# Patient Record
Sex: Female | Born: 1935
Health system: Southern US, Community
[De-identification: ages and names within clinical notes are randomized; demographics above are authoritative.]

## PROBLEM LIST (undated history)

## (undated) DIAGNOSIS — N289 Disorder of kidney and ureter, unspecified: Secondary | ICD-10-CM

## (undated) DIAGNOSIS — G43909 Migraine, unspecified, not intractable, without status migrainosus: Secondary | ICD-10-CM

## (undated) DIAGNOSIS — F419 Anxiety disorder, unspecified: Secondary | ICD-10-CM

## (undated) DIAGNOSIS — M545 Low back pain, unspecified: Secondary | ICD-10-CM

## (undated) DIAGNOSIS — I10 Essential (primary) hypertension: Secondary | ICD-10-CM

## (undated) DIAGNOSIS — I639 Cerebral infarction, unspecified: Secondary | ICD-10-CM

## (undated) DIAGNOSIS — J189 Pneumonia, unspecified organism: Secondary | ICD-10-CM

## (undated) DIAGNOSIS — I150 Renovascular hypertension: Secondary | ICD-10-CM

## (undated) DIAGNOSIS — M5126 Other intervertebral disc displacement, lumbar region: Secondary | ICD-10-CM

## (undated) DIAGNOSIS — M1990 Unspecified osteoarthritis, unspecified site: Secondary | ICD-10-CM

## (undated) DIAGNOSIS — E119 Type 2 diabetes mellitus without complications: Secondary | ICD-10-CM

## (undated) DIAGNOSIS — I739 Peripheral vascular disease, unspecified: Secondary | ICD-10-CM

## (undated) DIAGNOSIS — N390 Urinary tract infection, site not specified: Secondary | ICD-10-CM

## (undated) DIAGNOSIS — Z794 Long term (current) use of insulin: Secondary | ICD-10-CM

## (undated) DIAGNOSIS — I2510 Atherosclerotic heart disease of native coronary artery without angina pectoris: Secondary | ICD-10-CM

## (undated) DIAGNOSIS — G8929 Other chronic pain: Secondary | ICD-10-CM

## (undated) DIAGNOSIS — Z950 Presence of cardiac pacemaker: Secondary | ICD-10-CM

## (undated) DIAGNOSIS — IMO0001 Reserved for inherently not codable concepts without codable children: Secondary | ICD-10-CM

## (undated) HISTORY — PX: BACK SURGERY: SHX140

## (undated) HISTORY — PX: VAGINAL HYSTERECTOMY: SUR661

## (undated) HISTORY — PX: CARPAL TUNNEL RELEASE: SHX101

## (undated) HISTORY — PX: APPENDECTOMY: SHX54

## (undated) HISTORY — PX: URETERAL STENT PLACEMENT: SHX822

## (undated) HISTORY — DX: Unspecified osteoarthritis, unspecified site: M19.90

## (undated) HISTORY — PX: INSERT / REPLACE / REMOVE PACEMAKER: SUR710

## (undated) HISTORY — PX: LUMBAR LAMINECTOMY: SHX95

## (undated) HISTORY — PX: TONSILLECTOMY: SUR1361

## (undated) HISTORY — PX: EXCISIONAL HEMORRHOIDECTOMY: SHX1541

---

## 1989-03-08 HISTORY — PX: CAROTID ENDARTERECTOMY: SUR193

## 1997-03-08 DIAGNOSIS — I639 Cerebral infarction, unspecified: Secondary | ICD-10-CM

## 1997-03-08 HISTORY — DX: Cerebral infarction, unspecified: I63.9

## 1999-12-09 ENCOUNTER — Emergency Department (HOSPITAL_COMMUNITY): Admission: EM | Admit: 1999-12-09 | Discharge: 1999-12-09 | Payer: Self-pay | Admitting: Emergency Medicine

## 2000-01-15 ENCOUNTER — Ambulatory Visit (HOSPITAL_COMMUNITY): Admission: RE | Admit: 2000-01-15 | Discharge: 2000-01-15 | Payer: Self-pay | Admitting: Orthopedic Surgery

## 2000-01-15 ENCOUNTER — Encounter: Payer: Self-pay | Admitting: Orthopedic Surgery

## 2000-04-22 ENCOUNTER — Ambulatory Visit (HOSPITAL_COMMUNITY): Admission: RE | Admit: 2000-04-22 | Discharge: 2000-04-22 | Payer: Self-pay | Admitting: Neurosurgery

## 2000-04-25 ENCOUNTER — Inpatient Hospital Stay (HOSPITAL_COMMUNITY): Admission: RE | Admit: 2000-04-25 | Discharge: 2000-05-01 | Payer: Self-pay | Admitting: Neurosurgery

## 2000-05-27 ENCOUNTER — Encounter: Admission: RE | Admit: 2000-05-27 | Discharge: 2000-05-27 | Payer: Self-pay | Admitting: Neurosurgery

## 2000-08-12 ENCOUNTER — Encounter: Admission: RE | Admit: 2000-08-12 | Discharge: 2000-08-12 | Payer: Self-pay | Admitting: Neurosurgery

## 2002-03-24 ENCOUNTER — Ambulatory Visit (HOSPITAL_COMMUNITY): Admission: RE | Admit: 2002-03-24 | Discharge: 2002-03-24 | Payer: Self-pay | Admitting: Nephrology

## 2002-03-24 ENCOUNTER — Encounter: Payer: Self-pay | Admitting: Nephrology

## 2002-07-19 ENCOUNTER — Emergency Department (HOSPITAL_COMMUNITY): Admission: EM | Admit: 2002-07-19 | Discharge: 2002-07-19 | Payer: Self-pay

## 2002-09-18 ENCOUNTER — Ambulatory Visit (HOSPITAL_COMMUNITY): Admission: RE | Admit: 2002-09-18 | Discharge: 2002-09-18 | Payer: Self-pay | Admitting: Neurology

## 2003-01-08 ENCOUNTER — Emergency Department (HOSPITAL_COMMUNITY): Admission: EM | Admit: 2003-01-08 | Discharge: 2003-01-08 | Payer: Self-pay | Admitting: Emergency Medicine

## 2003-04-18 ENCOUNTER — Emergency Department (HOSPITAL_COMMUNITY): Admission: EM | Admit: 2003-04-18 | Discharge: 2003-04-18 | Payer: Self-pay | Admitting: Emergency Medicine

## 2003-04-29 ENCOUNTER — Emergency Department (HOSPITAL_COMMUNITY): Admission: EM | Admit: 2003-04-29 | Discharge: 2003-04-29 | Payer: Self-pay | Admitting: *Deleted

## 2003-05-24 ENCOUNTER — Ambulatory Visit (HOSPITAL_COMMUNITY): Admission: RE | Admit: 2003-05-24 | Discharge: 2003-05-24 | Payer: Self-pay | Admitting: Internal Medicine

## 2003-06-03 ENCOUNTER — Encounter (INDEPENDENT_AMBULATORY_CARE_PROVIDER_SITE_OTHER): Payer: Self-pay | Admitting: *Deleted

## 2003-06-03 ENCOUNTER — Ambulatory Visit (HOSPITAL_COMMUNITY): Admission: RE | Admit: 2003-06-03 | Discharge: 2003-06-03 | Payer: Self-pay | Admitting: Gastroenterology

## 2004-12-19 ENCOUNTER — Emergency Department (HOSPITAL_COMMUNITY): Admission: EM | Admit: 2004-12-19 | Discharge: 2004-12-19 | Payer: Self-pay | Admitting: Emergency Medicine

## 2007-07-19 ENCOUNTER — Emergency Department (HOSPITAL_COMMUNITY): Admission: EM | Admit: 2007-07-19 | Discharge: 2007-07-19 | Payer: Self-pay | Admitting: Emergency Medicine

## 2007-08-06 ENCOUNTER — Emergency Department (HOSPITAL_COMMUNITY): Admission: EM | Admit: 2007-08-06 | Discharge: 2007-08-06 | Payer: Self-pay | Admitting: Emergency Medicine

## 2007-12-07 ENCOUNTER — Encounter: Admission: RE | Admit: 2007-12-07 | Discharge: 2007-12-07 | Payer: Self-pay | Admitting: Cardiology

## 2007-12-19 ENCOUNTER — Ambulatory Visit (HOSPITAL_COMMUNITY): Admission: RE | Admit: 2007-12-19 | Discharge: 2007-12-20 | Payer: Self-pay | Admitting: Cardiology

## 2008-03-18 ENCOUNTER — Ambulatory Visit (HOSPITAL_COMMUNITY): Admission: RE | Admit: 2008-03-18 | Discharge: 2008-03-18 | Payer: Self-pay | Admitting: Cardiology

## 2008-04-11 ENCOUNTER — Encounter: Admission: RE | Admit: 2008-04-11 | Discharge: 2008-04-11 | Payer: Self-pay | Admitting: Internal Medicine

## 2008-06-07 ENCOUNTER — Observation Stay (HOSPITAL_COMMUNITY): Admission: EM | Admit: 2008-06-07 | Discharge: 2008-06-08 | Payer: Self-pay | Admitting: Emergency Medicine

## 2008-06-09 ENCOUNTER — Emergency Department (HOSPITAL_COMMUNITY): Admission: EM | Admit: 2008-06-09 | Discharge: 2008-06-09 | Payer: Self-pay | Admitting: Emergency Medicine

## 2009-04-11 ENCOUNTER — Ambulatory Visit: Payer: Self-pay | Admitting: Cardiovascular Disease

## 2009-04-11 ENCOUNTER — Encounter (INDEPENDENT_AMBULATORY_CARE_PROVIDER_SITE_OTHER): Payer: Self-pay | Admitting: Internal Medicine

## 2009-04-11 ENCOUNTER — Inpatient Hospital Stay (HOSPITAL_COMMUNITY): Admission: EM | Admit: 2009-04-11 | Discharge: 2009-04-15 | Payer: Self-pay | Admitting: Emergency Medicine

## 2009-04-19 ENCOUNTER — Emergency Department (HOSPITAL_COMMUNITY): Admission: EM | Admit: 2009-04-19 | Discharge: 2009-04-20 | Payer: Self-pay | Admitting: Emergency Medicine

## 2009-08-05 ENCOUNTER — Emergency Department (HOSPITAL_COMMUNITY): Admission: EM | Admit: 2009-08-05 | Discharge: 2009-08-05 | Payer: Self-pay | Admitting: Emergency Medicine

## 2009-08-13 ENCOUNTER — Encounter: Admission: RE | Admit: 2009-08-13 | Discharge: 2009-08-13 | Payer: Self-pay | Admitting: Orthopedic Surgery

## 2010-02-16 ENCOUNTER — Inpatient Hospital Stay (HOSPITAL_COMMUNITY)
Admission: RE | Admit: 2010-02-16 | Discharge: 2010-02-17 | Payer: Self-pay | Source: Home / Self Care | Attending: Cardiology | Admitting: Cardiology

## 2010-05-18 LAB — CBC
HCT: 38 % (ref 36.0–46.0)
MCH: 32.3 pg (ref 26.0–34.0)
MCHC: 33.9 g/dL (ref 30.0–36.0)
Platelets: 198 10*3/uL (ref 150–400)
RDW: 13.1 % (ref 11.5–15.5)
WBC: 5.8 10*3/uL (ref 4.0–10.5)

## 2010-05-18 LAB — GLUCOSE, CAPILLARY
Glucose-Capillary: 101 mg/dL — ABNORMAL HIGH (ref 70–99)
Glucose-Capillary: 119 mg/dL — ABNORMAL HIGH (ref 70–99)
Glucose-Capillary: 150 mg/dL — ABNORMAL HIGH (ref 70–99)
Glucose-Capillary: 92 mg/dL (ref 70–99)

## 2010-05-18 LAB — BASIC METABOLIC PANEL
Calcium: 9.3 mg/dL (ref 8.4–10.5)
Chloride: 99 mEq/L (ref 96–112)
GFR calc Af Amer: >60 mL/min (ref >60)
Potassium: 3.4 mEq/L — ABNORMAL LOW (ref 3.5–5.1)
Sodium: 136 mEq/L (ref 135–145)

## 2010-05-18 LAB — HEMOGLOBIN A1C: Hgb A1c MFr Bld: 8.1 % — ABNORMAL HIGH (ref <5.7)

## 2010-05-25 LAB — URINALYSIS, ROUTINE W REFLEX MICROSCOPIC
Bilirubin Urine: NEGATIVE
Glucose, UA: 250 mg/dL — AB
Ketones, ur: NEGATIVE mg/dL
Protein, ur: NEGATIVE mg/dL
Specific Gravity, Urine: 1.012 (ref 1.005–1.030)
Urobilinogen, UA: 0.2 mg/dL (ref 0.0–1.0)
pH: 5.5 (ref 5.0–8.0)

## 2010-05-27 LAB — GLUCOSE, CAPILLARY
Glucose-Capillary: 113 mg/dL — ABNORMAL HIGH (ref 70–99)
Glucose-Capillary: 115 mg/dL — ABNORMAL HIGH (ref 70–99)
Glucose-Capillary: 119 mg/dL — ABNORMAL HIGH (ref 70–99)
Glucose-Capillary: 138 mg/dL — ABNORMAL HIGH (ref 70–99)
Glucose-Capillary: 139 mg/dL — ABNORMAL HIGH (ref 70–99)
Glucose-Capillary: 144 mg/dL — ABNORMAL HIGH (ref 70–99)
Glucose-Capillary: 152 mg/dL — ABNORMAL HIGH (ref 70–99)
Glucose-Capillary: 154 mg/dL — ABNORMAL HIGH (ref 70–99)
Glucose-Capillary: 161 mg/dL — ABNORMAL HIGH (ref 70–99)
Glucose-Capillary: 162 mg/dL — ABNORMAL HIGH (ref 70–99)
Glucose-Capillary: 179 mg/dL — ABNORMAL HIGH (ref 70–99)
Glucose-Capillary: 186 mg/dL — ABNORMAL HIGH (ref 70–99)
Glucose-Capillary: 203 mg/dL — ABNORMAL HIGH (ref 70–99)
Glucose-Capillary: 231 mg/dL — ABNORMAL HIGH (ref 70–99)
Glucose-Capillary: 266 mg/dL — ABNORMAL HIGH (ref 70–99)
Glucose-Capillary: 326 mg/dL — ABNORMAL HIGH (ref 70–99)
Glucose-Capillary: 333 mg/dL — ABNORMAL HIGH (ref 70–99)
Glucose-Capillary: 37 mg/dL — CL (ref 70–99)
Glucose-Capillary: 384 mg/dL — ABNORMAL HIGH (ref 70–99)
Glucose-Capillary: 596 mg/dL (ref 70–99)
Glucose-Capillary: 66 mg/dL — ABNORMAL LOW (ref 70–99)
Glucose-Capillary: >600 mg/dL (ref 70–99)
Glucose-Capillary: >600 mg/dL (ref 70–99)

## 2010-05-27 LAB — BASIC METABOLIC PANEL
BUN: 12 mg/dL (ref 6–23)
BUN: 17 mg/dL (ref 6–23)
CO2: 22 mEq/L (ref 19–32)
CO2: 23 mEq/L (ref 19–32)
CO2: 23 mEq/L (ref 19–32)
Calcium: 7.6 mg/dL — ABNORMAL LOW (ref 8.4–10.5)
Calcium: 7.7 mg/dL — ABNORMAL LOW (ref 8.4–10.5)
Calcium: 7.8 mg/dL — ABNORMAL LOW (ref 8.4–10.5)
Chloride: 115 mEq/L — ABNORMAL HIGH (ref 96–112)
Chloride: 117 mEq/L — ABNORMAL HIGH (ref 96–112)
Creatinine, Ser: 0.78 mg/dL (ref 0.4–1.2)
GFR calc Af Amer: 31 mL/min — ABNORMAL LOW (ref >60)
GFR calc Af Amer: 52 mL/min — ABNORMAL LOW (ref >60)
GFR calc Af Amer: >60 mL/min (ref >60)
GFR calc non Af Amer: 26 mL/min — ABNORMAL LOW (ref >60)
Glucose, Bld: 20 mg/dL — CL (ref 70–99)
Glucose, Bld: 266 mg/dL — ABNORMAL HIGH (ref 70–99)
Glucose, Bld: 584 mg/dL (ref 70–99)
Potassium: 3.4 mEq/L — ABNORMAL LOW (ref 3.5–5.1)
Potassium: 3.4 mEq/L — ABNORMAL LOW (ref 3.5–5.1)
Potassium: 3.6 mEq/L (ref 3.5–5.1)
Sodium: 146 mEq/L — ABNORMAL HIGH (ref 135–145)
Sodium: 152 mEq/L — ABNORMAL HIGH (ref 135–145)
Sodium: 153 mEq/L — ABNORMAL HIGH (ref 135–145)

## 2010-05-27 LAB — RAPID URINE DRUG SCREEN, HOSP PERFORMED
Cocaine: NOT DETECTED
Opiates: NOT DETECTED
Tetrahydrocannabinol: NOT DETECTED

## 2010-05-27 LAB — URINE CULTURE
Colony Count: NO GROWTH
Culture: NO GROWTH

## 2010-05-27 LAB — COMPREHENSIVE METABOLIC PANEL
ALT: 33 U/L (ref 0–35)
Alkaline Phosphatase: 58 U/L (ref 39–117)
BUN: 57 mg/dL — ABNORMAL HIGH (ref 6–23)
CO2: 26 mEq/L (ref 19–32)
Calcium: 9.2 mg/dL (ref 8.4–10.5)
Creatinine, Ser: 2.51 mg/dL — ABNORMAL HIGH (ref 0.4–1.2)
GFR calc non Af Amer: 19 mL/min — ABNORMAL LOW (ref >60)
GFR calc non Af Amer: 35 mL/min — ABNORMAL LOW (ref >60)
Glucose, Bld: 1115 mg/dL (ref 70–99)
Glucose, Bld: 212 mg/dL — ABNORMAL HIGH (ref 70–99)
Potassium: 3.4 mEq/L — ABNORMAL LOW (ref 3.5–5.1)
Sodium: 145 mEq/L (ref 135–145)
Sodium: 156 mEq/L — ABNORMAL HIGH (ref 135–145)
Total Protein: 7.3 g/dL (ref 6.0–8.3)

## 2010-05-27 LAB — CBC
HCT: 42.8 % (ref 36.0–46.0)
HCT: 49.5 % — ABNORMAL HIGH (ref 36.0–46.0)
Hemoglobin: 14.6 g/dL (ref 12.0–15.0)
Hemoglobin: 16.1 g/dL — ABNORMAL HIGH (ref 12.0–15.0)
Hemoglobin: 16.7 g/dL — ABNORMAL HIGH (ref 12.0–15.0)
Hemoglobin: 18.2 g/dL — ABNORMAL HIGH (ref 12.0–15.0)
MCHC: 33.3 g/dL (ref 30.0–36.0)
MCHC: 33.5 g/dL (ref 30.0–36.0)
MCHC: 33.8 g/dL (ref 30.0–36.0)
MCHC: 34.5 g/dL (ref 30.0–36.0)
MCV: 101.7 fL — ABNORMAL HIGH (ref 78.0–100.0)
MCV: 102.9 fL — ABNORMAL HIGH (ref 78.0–100.0)
MCV: 104.2 fL — ABNORMAL HIGH (ref 78.0–100.0)
Platelets: 104 10*3/uL — ABNORMAL LOW (ref 150–400)
Platelets: 186 10*3/uL (ref 150–400)
Platelets: 88 10*3/uL — ABNORMAL LOW (ref 150–400)
RBC: 3.62 MIL/uL — ABNORMAL LOW (ref 3.87–5.11)
RBC: 4.92 MIL/uL (ref 3.87–5.11)
RDW: 14.1 % (ref 11.5–15.5)
RDW: 14.5 % (ref 11.5–15.5)
RDW: 14.6 % (ref 11.5–15.5)
RDW: 15 % (ref 11.5–15.5)

## 2010-05-27 LAB — URINE MICROSCOPIC-ADD ON

## 2010-05-27 LAB — URINALYSIS, ROUTINE W REFLEX MICROSCOPIC
Bilirubin Urine: NEGATIVE
Glucose, UA: >1000 mg/dL — AB
Hgb urine dipstick: NEGATIVE
Ketones, ur: 15 mg/dL — AB
Leukocytes, UA: NEGATIVE
Nitrite: NEGATIVE
Protein, ur: NEGATIVE mg/dL
Specific Gravity, Urine: 1.024 (ref 1.005–1.030)
Urobilinogen, UA: 0.2 mg/dL (ref 0.0–1.0)
pH: 5 (ref 5.0–8.0)
pH: 6 (ref 5.0–8.0)

## 2010-05-27 LAB — POCT I-STAT 3, VENOUS BLOOD GAS (G3P V)
O2 Saturation: 78 %
TCO2: 24 mmol/L (ref 0–100)
pCO2, Ven: 44.7 mmHg — ABNORMAL LOW (ref 45.0–50.0)
pH, Ven: 7.322 — ABNORMAL HIGH (ref 7.250–7.300)
pO2, Ven: 46 mmHg — ABNORMAL HIGH (ref 30.0–45.0)

## 2010-05-27 LAB — CULTURE, BLOOD (ROUTINE X 2): Culture: NO GROWTH

## 2010-05-27 LAB — HEPATIC FUNCTION PANEL
Bilirubin, Direct: 0.2 mg/dL (ref 0.0–0.3)
Indirect Bilirubin: 0.4 mg/dL (ref 0.3–0.9)
Total Bilirubin: 0.6 mg/dL (ref 0.3–1.2)

## 2010-05-27 LAB — POCT I-STAT, CHEM 8
BUN: 53 mg/dL — ABNORMAL HIGH (ref 6–23)
Calcium, Ion: 1.13 mmol/L (ref 1.12–1.32)
Chloride: 91 mEq/L — ABNORMAL LOW (ref 96–112)
Creatinine, Ser: 2 mg/dL — ABNORMAL HIGH (ref 0.4–1.2)
Glucose, Bld: 457 mg/dL — ABNORMAL HIGH (ref 70–99)
Glucose, Bld: >700 mg/dL (ref 70–99)
HCT: 37 % (ref 36.0–46.0)
Hemoglobin: 19 g/dL — ABNORMAL HIGH (ref 12.0–15.0)
Potassium: 5.9 mEq/L — ABNORMAL HIGH (ref 3.5–5.1)
Sodium: 144 mEq/L (ref 135–145)
TCO2: 21 mmol/L (ref 0–100)
TCO2: 32 mmol/L (ref 0–100)

## 2010-05-27 LAB — CARDIAC PANEL(CRET KIN+CKTOT+MB+TROPI)
CK, MB: 2.4 ng/mL (ref 0.3–4.0)
CK, MB: 3.6 ng/mL (ref 0.3–4.0)
Relative Index: INVALID (ref 0.0–2.5)
Total CK: 52 U/L (ref 7–177)
Total CK: 66 U/L (ref 7–177)

## 2010-05-27 LAB — LIPASE, BLOOD
Lipase: 12 U/L (ref 11–59)
Lipase: 12 U/L (ref 11–59)

## 2010-05-27 LAB — DIFFERENTIAL
Basophils Absolute: 0 10*3/uL (ref 0.0–0.1)
Basophils Relative: 0 % (ref 0–1)
Lymphocytes Relative: 4 % — ABNORMAL LOW (ref 12–46)
Lymphocytes Relative: 6 % — ABNORMAL LOW (ref 12–46)
Lymphs Abs: 0.8 10*3/uL (ref 0.7–4.0)
Monocytes Absolute: 0.2 10*3/uL (ref 0.1–1.0)
Monocytes Relative: 2 % — ABNORMAL LOW (ref 3–12)
Neutro Abs: 13.8 10*3/uL — ABNORMAL HIGH (ref 1.7–7.7)
Neutro Abs: 17.2 10*3/uL — ABNORMAL HIGH (ref 1.7–7.7)
Neutrophils Relative %: 92 % — ABNORMAL HIGH (ref 43–77)
Neutrophils Relative %: 93 % — ABNORMAL HIGH (ref 43–77)

## 2010-05-27 LAB — MAGNESIUM: Magnesium: 2.5 mg/dL (ref 1.5–2.5)

## 2010-05-27 LAB — KETONES, QUALITATIVE

## 2010-05-27 LAB — HEMOGLOBIN A1C: Hgb A1c MFr Bld: 12 % — ABNORMAL HIGH (ref 4.6–6.1)

## 2010-05-27 LAB — FOLATE RBC: RBC Folate: 799 ng/mL — ABNORMAL HIGH (ref 180–600)

## 2010-05-27 LAB — LIPID PANEL
Triglycerides: 129 mg/dL (ref <150)
VLDL: 26 mg/dL (ref 0–40)

## 2010-05-27 LAB — TSH: TSH: 0.32 u[IU]/mL — ABNORMAL LOW (ref 0.350–4.500)

## 2010-05-27 LAB — MRSA PCR SCREENING: MRSA by PCR: NEGATIVE

## 2010-05-27 LAB — D-DIMER, QUANTITATIVE: D-Dimer, Quant: 3.46 ug/mL-FEU — ABNORMAL HIGH (ref 0.00–0.48)

## 2010-06-17 LAB — COMPREHENSIVE METABOLIC PANEL
ALT: 24 U/L (ref 0–35)
Alkaline Phosphatase: 69 U/L (ref 39–117)
BUN: 35 mg/dL — ABNORMAL HIGH (ref 6–23)
CO2: 25 mEq/L (ref 19–32)
GFR calc non Af Amer: 26 mL/min — ABNORMAL LOW (ref >60)
Glucose, Bld: 1068 mg/dL (ref 70–99)
Potassium: 5.1 mEq/L (ref 3.5–5.1)
Sodium: 136 mEq/L (ref 135–145)
Total Bilirubin: 0.6 mg/dL (ref 0.3–1.2)

## 2010-06-17 LAB — BASIC METABOLIC PANEL
CO2: 24 mEq/L (ref 19–32)
Calcium: 8.3 mg/dL — ABNORMAL LOW (ref 8.4–10.5)
GFR calc Af Amer: 39 mL/min — ABNORMAL LOW (ref >60)
Potassium: 3.5 mEq/L (ref 3.5–5.1)
Sodium: 141 mEq/L (ref 135–145)

## 2010-06-17 LAB — CBC
HCT: 47.1 % — ABNORMAL HIGH (ref 36.0–46.0)
Hemoglobin: 15.8 g/dL — ABNORMAL HIGH (ref 12.0–15.0)
MCHC: 33.5 g/dL (ref 30.0–36.0)
MCHC: 34 g/dL (ref 30.0–36.0)
MCV: 98.3 fL (ref 78.0–100.0)
Platelets: 131 10*3/uL — ABNORMAL LOW (ref 150–400)
RBC: 4.68 MIL/uL (ref 3.87–5.11)
WBC: 7.1 10*3/uL (ref 4.0–10.5)

## 2010-06-17 LAB — GLUCOSE, CAPILLARY
Glucose-Capillary: 196 mg/dL — ABNORMAL HIGH (ref 70–99)
Glucose-Capillary: 281 mg/dL — ABNORMAL HIGH (ref 70–99)
Glucose-Capillary: 401 mg/dL — ABNORMAL HIGH (ref 70–99)
Glucose-Capillary: 438 mg/dL — ABNORMAL HIGH (ref 70–99)
Glucose-Capillary: 460 mg/dL — ABNORMAL HIGH (ref 70–99)
Glucose-Capillary: >600 mg/dL (ref 70–99)

## 2010-06-17 LAB — DIFFERENTIAL
Basophils Absolute: 0 10*3/uL (ref 0.0–0.1)
Basophils Relative: 0 % (ref 0–1)
Basophils Relative: 0 % (ref 0–1)
Eosinophils Absolute: 0 10*3/uL (ref 0.0–0.7)
Eosinophils Absolute: 0.3 10*3/uL (ref 0.0–0.7)
Neutro Abs: 8.3 10*3/uL — ABNORMAL HIGH (ref 1.7–7.7)
Neutrophils Relative %: 67 % (ref 43–77)
Neutrophils Relative %: 89 % — ABNORMAL HIGH (ref 43–77)

## 2010-06-17 LAB — POCT I-STAT 3, VENOUS BLOOD GAS (G3P V)
pCO2, Ven: 38.8 mmHg — ABNORMAL LOW (ref 45.0–50.0)
pH, Ven: 7.424 — ABNORMAL HIGH (ref 7.250–7.300)

## 2010-06-17 LAB — URINALYSIS, ROUTINE W REFLEX MICROSCOPIC
Hgb urine dipstick: NEGATIVE
Leukocytes, UA: NEGATIVE
Nitrite: NEGATIVE
Protein, ur: NEGATIVE mg/dL
Urobilinogen, UA: 0.2 mg/dL (ref 0.0–1.0)

## 2010-06-17 LAB — URINE MICROSCOPIC-ADD ON

## 2010-06-17 LAB — POCT I-STAT, CHEM 8
Creatinine, Ser: 0.9 mg/dL (ref 0.4–1.2)
Hemoglobin: 15 g/dL (ref 12.0–15.0)
Sodium: 134 mEq/L — ABNORMAL LOW (ref 135–145)
TCO2: 24 mmol/L (ref 0–100)

## 2010-06-22 LAB — GLUCOSE, CAPILLARY: Glucose-Capillary: 152 mg/dL — ABNORMAL HIGH (ref 70–99)

## 2010-07-21 NOTE — Cardiovascular Report (Signed)
Carmen [name], Carmen [name]               ACCOUNT NO.:  [id number]   MEDICAL RECORD NO.:  [id number]          PATIENT TYPE:  AMB   LOCATION:  SDS                          FACILITY:  [facility]   PHYSICIAN:  Vonna Kotyk R. Jacinto Halim, MD       DATE OF BIRTH:  27-Sep-1933   DATE OF PROCEDURE:  03/18/2008  DATE OF DISCHARGE:  03/18/2008                            CARDIAC CATHETERIZATION   PROCEDURES PERFORMED:  1. Right subclavian arteriogram.  2. Percutaneous transluminal angioplasty and stenting of the right      subclavian artery.   INDICATIONS:  [name] is a 77 year old female with known high-  grade right subclavian artery.  She has significant claudication of her  right extremity.  She also has mild contracture of her bicipital muscle  from prior stroke; however because of significant claudication symptoms,  she is now brought to the Catheterization Lab for PTA and stenting of  the right subclavian artery.   ANGIOGRAPHIC DATA:  Right innominate artery.  Right innominate artery  shows it to be widely patent.  The right subclavian artery just after  its origin has a high grade 90% stenosis.  There is significant calcium  noted into the right subclavian artery.  The calcification extents into  the left common carotid artery.   INTERVENTION DATA:  Successful PTA and stenting of the right subclavian  artery with implantation of an 8.0 x 24 mm Genesis stent, which was  deployed at 8 atmospheric pressure for 90 seconds.  Having performed  this, post-stenting angiography revealed excellent result with less than  20% residual stenosis.  There was significant waist and resistance noted  during balloon inflations at the site of stenoses.   There was no immediate complication noted.  The patient tolerated the  procedure well.  Postprocedure, brachial pulses and radial pulses were  3+.   A total of 75 mL of contrast was utilized for diagnostic and  intervention procedure.   OPERATOR:  Cristy Hilts. Jacinto Halim,  MD   ASSISTANT:  Nanetta Batty, MD   TECHNIQUE OF THE PROCEDURE:  Under usual sterile precautions using a 6-  French right femoral artery access, a 5-French JB1 diagnostic catheter  was advanced into the arch of the aorta and I was able to engage the  right innominate artery.  Using a 0.014th of an inch guidewire, long  exchange length Glidewire, I was able to cross the right subclavian  artery stenosis with mild-to-moderate difficulty.  Then having performed  this, I was able to advance the JB1 catheter into the right subclavian  artery distally.  The Glidewire was removed, heparin was administered,  and a stiff Amplatz wire was advanced into the right subclavian artery.  A 6-French shuttle sheath was then advanced over this Amplatzer wire and  carefully positioned into the distal end of the innominate artery.  Having performed this, a 5.0 x 20 mm FoxCross balloon was utilized to  perform balloon angioplasty at around 8 atmospheric pressure.  Having  performed this, the same balloon was advanced distally.  The Amplatz  wire was withdrawn, and a Wholey wire was  advanced into the distal  subclavian artery.  The balloon was exchanged and we decided to stent  this with a 8.0 x 24 mm Genesis or Opta stent and this was carefully  positioned and deployed at 8 atmospheric pressure for about 90 seconds.  Having performed this, angiography revealed excellent results.  The  balloon was then withdrawn out of the body and angiography repeated.  Guidewire was withdrawn, and the long shuttle sheath was exchanged to a  short 6-French sheath and the patient was transferred to the holding  area in a stable condition.  The patient tolerated the procedure.   Please note the procedure performed by Dr. Jeanella Cara, second assistant is  Dr. Nanetta Batty.      Cristy Hilts. Jacinto Halim, MD  Electronically Signed     JRG/MEDQ  D:  03/18/2008  T:  03/19/2008  Job:  [id number]   cc:   Candyce Churn. Allyne Gee, M.D.

## 2010-07-21 NOTE — Discharge Summary (Signed)
NAMEDIONNE, KNOOP               ACCOUNT NO.:  [id number]   MEDICAL RECORD NO.:  [id number]          PATIENT TYPE:  OIB   LOCATION:  6526                         FACILITY:  [facility]   PHYSICIAN:  Vonna Kotyk R. Jacinto Halim, MD       DATE OF BIRTH:  1933/09/21   DATE OF ADMISSION:  12/19/2007  DATE OF DISCHARGE:  12/20/2007                               DISCHARGE SUMMARY   DISCHARGE DIAGNOSES:  1. Peripheral vascular disease, status post left renal artery stenting      this admission with residual right subclavian artery stenosis, also      angioplasty this admission.  2. Insulin-dependent diabetes.  3. Treated hypertension.  4. Dyslipidemia.   HOSPITAL COURSE:  The patient is a 77 year old female followed by Dr.  Allyne Gee and Dr. Jacinto Halim.  She has known peripheral disease and difficult  to control hypertension.  Despite multiple medications, her blood  pressure has been elevated.  She has also had known right subclavian  artery stenosis with right upper extremity claudication.  She was set up  for elective peripheral angiogram, which was done on December 19, 2007,  by Dr. Jacinto Halim.  This revealed 80% left renal artery stenosis, which was  stented with a genesis stent.  She also had a 80%-90% right subclavian  artery ostial stenosis that was angioplastied.  We feel she can be  discharged December 20, 2007.  She will follow up with Dr. Jacinto Halim in a  couple of week with upper extremities and renal artery Dopplers.   LABS:  Sodium 136, potassium 3.8, BUN 8, creatinine 0.73.  White count  6.8, hemoglobin 12.9, hematocrit 39.1, and platelets 218.   DISCHARGE MEDICATIONS:  1. Aspirin 81 mg a day.  2. Amlodipine 10 mg a day.  3. Clonidine 0.2 mg b.i.d.  4. Lescol 80 mg a day.  5. Coreg 12.5 mg twice a day.  6. Januvia 100 mg a day.  7. Metformin 500 mg in the morning and 1 g in the evening will be held      until December 22, 2007.  8. Lantus 15 units a day.  9. Lisinopril and hydrochlorothiazide 20/12.5  daily.  10.Xanax p.r.n.  11.Plavix 75 mg a day.  12.Pepcid AC 20 mg daily.   Also an addendum to the above medications, at discharge we decided to  cut her clonidine back to 0.1 mg b.i.d.   DISPOSITION:  The patient was discharged in stable condition and will  follow up with Dr. Jacinto Halim in a couple of weeks.      Abelino Derrick, P.A.      Cristy Hilts. Jacinto Halim, MD  Electronically Signed    LKK/MEDQ  D:  12/20/2007  T:  12/20/2007  Job:  [id number]   cc:   Candyce Churn. Allyne Gee, M.D.

## 2010-07-21 NOTE — Cardiovascular Report (Signed)
[name], [name]               ACCOUNT NO.:  [id number]   MEDICAL RECORD NO.:  [id number]          PATIENT TYPE:  OIB   LOCATION:  6526                         FACILITY:  [facility]   PHYSICIAN:  Vonna Kotyk R. Jacinto Halim, MD       DATE OF BIRTH:  [birth year]   DATE OF PROCEDURE:  12/19/2007  DATE OF DISCHARGE:                            CARDIAC CATHETERIZATION   12/19/07   PROCEDURES PERFORMED:  1. Arch aortogram.  2. Selective right subclavian arteriogram.  3. Abdominal aortogram.  4. Selective left renal arteriogram.  5. Percutaneous transluminal angioplasty and stenting of the left      renal artery.   INDICATIONS:  [name] is a 77 year old female with known  peripheral arterial disease.  She has history of left carotid  endarterectomy followed by a patch angioplasty in [place] about 15  years ago.  She has had stroke with right-sided mild weakness.  She has  been having right upper extremity claudication and blood pressure  differential in both right and left arm.  She also has difficult to  control hypertension.  She had undergone duplex evaluation of her  carotids and also of the renal arteries and this had revealed high-grade  stenosis in right subclavian artery and also bilateral renal artery  stenosis.  Given this and given uncontrolled hypertension in spite of  aggressive medical therapy, she was brought to the peripheral  angiography suite to evaluate both the renal artery stenoses and also to  evaluate the right subclavian artery stenosis because of symptomatic  stenosis from right upper extremity claudication.   ARCH AORTOGRAM:  Arch aortogram revealed type 1 arch.  There was mild-to-  moderate amount of diffuse calcification noted in the arch of the aorta.   The right common carotid artery showed mild diffuse calcification.  The  left common carotid artery showed mild atherosclerotic changes and left  subclavian artery showed mild atherosclerotic changes.   The  right subclavian artery which was selectively cannulated revealed a  high-grade heavily calcified 80-90% stenoses.  There was acute angle  takeoff with acute angle anterior and inferior takeoff of the right  subclavian artery origin.   ABDOMINAL AORTOGRAM:  Abdominal aortogram revealed diffuse  atherosclerotic plaque in the abdominal aorta and moderate amount of  diffuse calcification.  There was a 30% infrarenal abdominal aortic  stenosis.   There were 2 renal arteries, one on either side.  The right renal artery  was widely patent and left renal artery showed 80% stenosis which was  long.   INTERVENTION DATA:  Successful PTA and stenting of the left renal artery  with implantation of a 7.0 x 18 mm Genesis over Opta balloon deployed at  the peak of 10 atmospheric pressure.  Overall, the stenosis was reduced  from 80% to 0% with brisk flow noted.  There was no evidence of  dissection.  The ostium was well covered.   RECOMMENDATIONS:  The patient will need elective intravascular  ultrasound-guided right subclavian artery stenosis angioplasty.  She  probably will need both the upper extremity and lower extremity arterial  access.  I have discussed these findings with Dr. Nanetta Batty.  We  will bring her back on elective fashion with a longer IVUS catheter  shaft as we have only 95-cm shaft and will need at least 110 or longer  shaft.  We will try to minimize the contrast.  During this procedure, we  utilized 110 mL of contrast.  She will be discharged home in the  morning.  Hopefully, her blood pressure will be better controlled.   PROCEDURE TECHNIQUE:  Under usual sterile precautions using a 5-French  right femoral artery access, a 5-French pigtail catheter was advanced to  the ascending aorta and arch aortogram was performed in the LAO  projection.  The same catheter was pulled up to the abdominal aorta and  abdominal aortogram was performed.  The catheter was then exchanged to  a  6-French Judkins right 4 diagnostic catheter and selective right  subclavian arteriography was performed.  Then the catheter was pulled  out of the body.   TECHNIQUE OF INTERVENTION:  Using heparin for anticoagulation and  maintaining ACT greater than 200, a 7-French LIMA guide catheter was  utilized to engage the left renal artery.  Using a Stabilizer guidewire,  I predilated the lesion with a 5.0 x 20 mm Aviator balloon was utilized  and 2 inflations at 8 atmospheric pressure was performed.  This was  followed by stenting with a 7.0 x 18 mm Genesis stent deployed at a peak  of 10 atmospheric pressure x2.  Postprocedure angiography revealed  excellent results.  The patient tolerated the procedure well.  Having  performed this, I withdrew the guidewire and guide catheter out of the  body.  No immediate complications were noted.      Cristy Hilts. Jacinto Halim, MD  Electronically Signed     JRG/MEDQ  D:  12/19/2007  T:  12/20/2007  Job:  [id number]   cc:   Candyce Churn. Allyne Gee, M.D.

## 2010-07-24 NOTE — Op Note (Signed)
[place]. [facility]  Patient:    Carmen [name], Carmen [name]                        MRN: [id number] Proc. Date: 04/25/00 Adm. Date:  [id number] Attending:  Tressie Stalker D                           Operative Report  BRIEF HISTORY:  The patient is a 77 year old black female, who has suffered from many years of back pain.  She has been disabled since 1992 secondary to back and right leg pain.  Her discomfort has significantly worsened recently and she failed medical management and was therefore worked up with a lumbar MRI demonstrating a herniated disk at L5-S1 on the right with a spondylolisthesis.  I discussed the various treatment options with her, including a microdiskectomy versus a posterior lumbar interbody fusion with insertion of pedicle screws and rods.  Patient weighed the risks, benefits and alternatives to surgery and decided to proceed with the lumbar fusion.  PREOPERATIVE DIAGNOSES:  L5-S1 grade 1 acquired spondylolisthesis, degenerative disk disease, spinal stenosis, herniated nucleus pulposus.  POSTOPERATIVE DIAGNOSES:  L5-S1 grade 1 acquired spondylolisthesis, degenerative disk disease, spinal stenosis, herniated nucleus pulposus.  PROCEDURE:  L5 Gill procedure with posterior lumbar interbody fusion L5-S1, insertion of Synthes cortical bone dowel (13 mm TLIF dowel), posterior nonsegmental instrumentation with SDRS 90 degree titanium pedicle screws and rods L5-S1, posterolateral arthrodesis L5-S1 with local morcellized autograft bone and cancellous allograft bone and Orthoblast putty.  SURGEON:  Cristi Loron, M.D.  ASSISTANT:  Tanya Nones. Jeral Fruit, M.D.  ANESTHESIA:  General endotracheal.  ESTIMATED BLOOD LOSS:  300  cc.  SPECIMENS:  None.  COMPLICATIONS:  None.  DRAINS:  None.  DESCRIPTION OF PROCEDURE:  The patient was brought to the operating room by the anesthesia team, general endotracheal anesthesia was induced.  The patient was  then turned to the prone position on the chest rolls.  Her lumbosacral region was then prepared with Betadine scrub and Betadine solution and sterile drapes were applied.  I then injected the area to be incised with Marcaine with epinephrine solution and I used a scalpel to make a vertical incision in the midline over the L5-S1 interspace.  I used electrocautery to dissect down to the thoracolumbar fascia, dividing the fascia bilaterally, performing a bilateral subperiosteal dissection, stripping the paraspinous musculature from the spinous process and lamina of L4, L5, and the upper sacrum.  I inserted the McCullough retractor for exposure and then obtained an intraoperative radiograph to confirm my location.  I then used the high-speed drill to perform a bilateral L5 laminotomies.  I then widened the laminotomies with the Kerrison punch.  I removed the bilateral L5-S1 ligamentum flavum.  I identified the thecal sac and performed a foraminotomy about the bilateral S1 nerve roots.  I took off more bone on the right side, as this was the side I put the TLIF bone in.  I removed more of the bone up in the pars region on the right side, as well as the medial aspect of the facet and identified the L5 nerve root as it exited around the L5 pedicle on the right.  I then used the DErrico retractor to retract the right thecal sac and S1 nerve root medially and I then incised the intervertebral disk and performed aggressive diskectomy using the pituitary forceps, Epstein and Scoville curets, and the various  TLIF angled curets.  I then repeated this procedure on the left side.  I performed a diskectomy on the left side.  I was then able to insert the interbody spreader on the left side and distract the L5-S1 interspace.  This gave greater access to the interspace from the right side.  I then used after I had cleared the soft tissue from the vertebral end plates with the curets.  I then inserted various  the seven, nine, 11, then 13 mm TLIF trial spacer after carefully retracting the thecal sac and nerve root out of harms way.  I inserted these on the right side.  Looked like a 13 mm TLIF spacer was the best fit.  I then inserted a 13 mm TLIF bone graft into the interspace from the right side after carefully retracting the thecal sac and right S1 nerve root medially and the L5 nerve root in the cephalad direction.  I did this under fluoroscopic guidance.  I then removed the interbody spreader and then tapped the TLIF bone into good position under fluoroscopic guidance in the L5-S1 interspace.  I then packed posteriorly to the TLIF spacer with cancellous allograft bone, as well as local morcellized autograft bone.  Having completed the posterior lumbar interbody fusion, I now turn my attention to the posterior nonsegmental instrumentation.  I used the electrocautery to expose the bilateral transverse process at L5, as well as sacral ala.  I then, under fluoroscopic guidance, used the high-speed drill to decorticate posterior to the bilateral L5-S1 pedicles.  I then cannulated the pedicles with the pedicle probe and then tapped the pedicles.  This was all done under fluoroscopic guidance and I felt about the anterior of the pedicles with the straight ball probe and noted they were well within the bone.  I inserted a 6.5 x ______ mm pedicle screws bilaterally at L5 and 6.5 x 40 mm pedicle screws bilaterally at S1.  I palpated about the bilateral L5 and S1 pedicles and noted I could not feel the pedicle screw breaching the cortex of the pedicles and that the bilateral L5 and S1 nerve roots were unharmed.  I then connected the unilateral pedicle screws with 40 mm titanium rods and secured the appropriate caps on each pedicle screw.  Having completed the posterior nonsegmental instrumentation, I now turn my attention to the posterolateral arthrodesis.  I used the high-speed drill to decorticate  the L5 transverse processes, the remainder of the pars region and the L5-S1 facet joint.  I then applied a combination of local morcellized autograft bone, cancellous allograft bone and Orthoblast putty over the  decorticated posterolateral bony structures.  I then inspected the thecal sac and bilateral L5, S1 nerve roots one last time and noted they were well-decompressed.  I achieved astringent hemostasis using bipolar electrocautery.  I then removed the retractor and then reapproximated the patients thoracolumbar fascia with interrupted #1 Vicryl suture, the subcutaneous tissue with interrupted 2-0 Vicryl, the skin with Steri-Strips and benzoin.  The wound was then coated with bacitracin ointment and sterile dressing applied.  The drapes were removed and the patient was subsequently returned to the supine position, where she was extubated by the anesthesia team and transported to the post anesthesia care unit in stable condition. All sponge, instrument and needle counts were correct at the end of this case. D:  04/25/00 TD:  04/25/00 Job: [id number]

## 2010-07-24 NOTE — Discharge Summary (Signed)
[place]. [facility]  Patient:    Carmen [name], Carmen [name]                        MRN: [id number] Adm. Date:  [id number] Disc. Date: [id number] Attending:  Tressie Stalker D                           Discharge Summary  ADMISSION DIAGNOSES:  L5-S1 spondylolisthesis.  DISCHARGE DIAGNOSIS:  L5-S1 spondylolisthesis.  HISTORY OF PRESENT ILLNESS:  The patient was admitted because of back and right leg pain.  X-rays show that she has degenerative disk disease at the level of L5-S1 with a herniated disk at the level on the right side and spondylolisthesis.  Surgery was advised by Dr. Lovell Sheehan in view of worsening of the pain.  LABORATORY DATA:  Laboratory is normal except she has a low sodium, between 125-130 with a serum osmolality of 278 and chloride between 86-91.  HOSPITAL COURSE:  The patient was taken to surgery and a L5-S1 diskectomy with fusion using pedicle screw was done.  The patient from the surgical point of view did well but the sodium has been persistently low.  We got an internal medicine consult and they discontinued the diuretic.  Today, the sodium is 129 with a chloride of 91.  This morning she was seen by internal medicine and can home to be followed by them early next week.  DISCHARGE CONDITION:  Improving.  DISCHARGE MEDICATIONS:  She will be taking Tylox for pain.  Also, she is going she is going to take her home medications except for the diuretic.  FOLLOW-UP:  She is to call Dr. Lovell Sheehan to set up to see Dr. Lovell Sheehan in two weeks.  She is to call her medical doctor to see him early next week.  ACTIVITIES:  She is not to drive.  She is not to do any lifting.  DIET:  Regular. DD:  05/01/00 TD:  05/02/00 Job: [id number]

## 2010-07-24 NOTE — H&P (Signed)
Carmen. [facility]  Patient:    Carmen [name], Carmen [name]                        MRN: [id number] Proc. Date: 04/25/00 Adm. Date:  [id number] Attending:  Tressie Stalker D                         History and Physical  CHIEF COMPLAINT:  Back pain, right leg pain.  HISTORY OF PRESENT ILLNESS:  The patient is a 77 year old black female who had trouble with her back for many years.  She has had intermittent flare-ups but treated with medications.  She had another severe flare-up in July of 2001 with increasing back pain and pain radiating down her right leg.  She failed medical management and was worked up with a lumbar MRI.  It demonstrated a spondylolisthesis at L5-S1 with a right-sided herniated nucleus pulposus.  She therefore weighed the risks, benefits and alternatives of surgery and decided to proceed with decompression and fusion.  PAST MEDICAL HISTORY:  Positive for cerebrovascular accident in 1993 with residual right upper extremity paresis, hypertension, diabetes mellitus diagnosed 17 years ago, cerebrovascular disease, hyponatremia (this is attributed to medications and has been worked up in the past).  PAST SURGICAL HISTORY:  Left carotid endarterectomy in 1993.  MEDICATIONS PRIOR TO ADMISSION: 1. Avandia 8 mg p.o. q.d. 2. Avalide 300/12.5 mg p.o. q.d. 3. Glucophage 1000 mg p.o. q.d. 4. Darvocet p.r.n. 5. Soma p.r.n. 6. Cardura 8 mg p.o. b.i.d. 7. Vioxx 25 mg p.o. q.d.  ALLERGIES:  No known drug allergies.  FAMILY MEDICAL HISTORY:  Patients mother died in her 47s secondary to a myocardial infarction.  The patients father died in his 87s secondary to to myeloma.  SOCIAL HISTORY:  The patient is married.  She has one daughter.  She lives in [place].  She has been disabled since 1992 secondary to her back troubles. She smokes one pack per day of cigarettes x approximately 50 years.  I highly advised her to quit.  She denies ethanol or drug  use.  REVIEW OF SYSTEMS:  Negative except as above.  PHYSICAL EXAMINATION:  GENERAL:  A pleasant 77 year old black female complaining of right leg pain.  VITAL SIGNS:  Height 5 feet 4 inches, weight 130 pounds.  HEENT:  Normal.  NECK:  Normal except that she has a well-healed left carotid endarterectomy scar without signs of infection.  THORAX:  Symmetric.  The lungs were clear to auscultation.  HEART:  Regular rate and rhythm.  ABDOMEN:  Soft and nontender.  EXTREMITIES:  No obvious deformities except that she has some mild contractures of her right hand.  BACK:  There is no point tenderness or deformities.  Straight leg raise testing is positive on the right and negative on the left.  Fabere testing is negative bilaterally.  NEUROLOGIC:  The patient is alert and oriented x 3.  Cranial nerves 2-12 were grossly intact bilaterally.  Vision is grossly normal bilaterally with corrective lenses.  Hearing grossly normal bilaterally.  Motor strength is 5/5 in bilateral deltoids, biceps, triceps, wrist extensor, psoas, quadriceps, gastrocnemius, extensor hallucis longus.  Left hand grip and interosseous: She had some weakness in the left hand grips,  i.e. 4/5.  Cerebellar exam demonstrates some apraxia in the right upper extremity otherwise unremarkable. Sensory exam is normal to light touch and pinprick sensation in all tested dermatomes bilaterally.  Deep tendon reflexes were 2/4  and her left biceps, triceps, 2+/4 in right biceps and triceps, absent in bilateral brachioradialis, trace in her bilateral quadriceps and left gastrocnemius and right gastrocnemius.  She has equivocal right plantar response and her left flexor plantar response.  LABORATORY DATA:  The patient had a lumbar MRI performed at [facility] on January 15, 2000.  She has a mild degenerative spinal disease at L5-S1 and has a right-sided herniated nucleus pulposus.  ASSESSMENT AND PLAN:  L5-S1 herniated  nucleus pulposus, spinal stenosis, spondylolisthesis, degenerative disk disease, lumbar radiculopathy.  Discussed the situation with the patient and reviewed the MRI scan with her and pointed out the abnormalities.  She appears to be suffering from right S1 radiculopathy caused by herniated disk at L5-S1, but she also has a spondylolisthesis.  I have discussed the treatment options including a microdiskectomy versus a Gill procedure, posterior lumbar interbody fusion and placement of pedicle screws and rods.  I have discussed the risks of surgery including the risks of anesthesia, medical complications, hemorrhage requiring transfusion, infection, dural tear, injury to the lumbar nerve roots causing temporary or permanent leg pain, numbness, weakness, postlaminectomy ____________ , fusion failure, malposition of instrumentation, failure to relieve the pain, worsening pain, abdominal injury, etc. The patient has weighed the risks, benefits and alternatives of surgery and wants to proceed with the fusion on April 25, 2000.  History  medical problems noted. Multiple medical problems noted. Hyponatremia.  This has been a chronic problem.  Her sodium is 130 the morning of surgery which is improved. DD:  04/25/00 TD:  04/25/00 Job: [id number]

## 2010-08-19 ENCOUNTER — Emergency Department (HOSPITAL_COMMUNITY)
Admission: EM | Admit: 2010-08-19 | Discharge: 2010-08-19 | Disposition: A | Discharge status: Home / Self Care | Payer: Medicare Other | Attending: Emergency Medicine | Admitting: Emergency Medicine

## 2010-08-19 DIAGNOSIS — E7800 Pure hypercholesterolemia, unspecified: Secondary | ICD-10-CM | POA: Insufficient documentation

## 2010-08-19 DIAGNOSIS — E119 Type 2 diabetes mellitus without complications: Secondary | ICD-10-CM | POA: Insufficient documentation

## 2010-08-19 DIAGNOSIS — Z79899 Other long term (current) drug therapy: Secondary | ICD-10-CM | POA: Insufficient documentation

## 2010-08-19 DIAGNOSIS — R609 Edema, unspecified: Secondary | ICD-10-CM | POA: Insufficient documentation

## 2010-08-19 DIAGNOSIS — M79609 Pain in unspecified limb: Secondary | ICD-10-CM | POA: Insufficient documentation

## 2010-08-19 DIAGNOSIS — I739 Peripheral vascular disease, unspecified: Secondary | ICD-10-CM | POA: Insufficient documentation

## 2010-08-19 DIAGNOSIS — M7989 Other specified soft tissue disorders: Secondary | ICD-10-CM | POA: Insufficient documentation

## 2010-08-19 DIAGNOSIS — I10 Essential (primary) hypertension: Secondary | ICD-10-CM | POA: Insufficient documentation

## 2010-08-19 DIAGNOSIS — L989 Disorder of the skin and subcutaneous tissue, unspecified: Secondary | ICD-10-CM | POA: Insufficient documentation

## 2010-08-19 LAB — COMPREHENSIVE METABOLIC PANEL
ALT: 36 U/L — ABNORMAL HIGH (ref 0–35)
Alkaline Phosphatase: 84 U/L (ref 39–117)
CO2: 26 mEq/L (ref 19–32)
Calcium: 9.3 mg/dL (ref 8.4–10.5)
GFR calc Af Amer: >60 mL/min (ref >60)
GFR calc non Af Amer: >60 mL/min (ref >60)
Glucose, Bld: 80 mg/dL (ref 70–99)
Potassium: 3.4 mEq/L — ABNORMAL LOW (ref 3.5–5.1)
Sodium: 139 mEq/L (ref 135–145)

## 2010-12-02 LAB — POCT I-STAT, CHEM 8
BUN: 6
Calcium, Ion: 1.26
Chloride: 100
Creatinine, Ser: 0.8
Glucose, Bld: 76
HCT: 47 — ABNORMAL HIGH
Hemoglobin: 16 — ABNORMAL HIGH
Potassium: 4.2
Sodium: 136
TCO2: 28

## 2010-12-02 LAB — DIFFERENTIAL
Basophils Absolute: 0
Basophils Relative: 0
Eosinophils Relative: 3
Lymphocytes Relative: 26
Monocytes Absolute: 0.3
Monocytes Relative: 3

## 2010-12-02 LAB — COMPREHENSIVE METABOLIC PANEL
AST: 33
Albumin: 4.3
Alkaline Phosphatase: 53
Chloride: 99
GFR calc Af Amer: >60
Potassium: 4.6
Total Bilirubin: 0.4

## 2010-12-02 LAB — CBC
Platelets: 216
WBC: 8.2

## 2010-12-07 LAB — CBC
Hemoglobin: 12.9
MCHC: 33
Platelets: 216
RDW: 14.1

## 2010-12-07 LAB — GLUCOSE, CAPILLARY
Glucose-Capillary: 129 — ABNORMAL HIGH
Glucose-Capillary: 177 — ABNORMAL HIGH
Glucose-Capillary: 307 — ABNORMAL HIGH

## 2010-12-07 LAB — BASIC METABOLIC PANEL
BUN: 8
Calcium: 9.2
Creatinine, Ser: 0.73
GFR calc non Af Amer: >60
Glucose, Bld: 122 — ABNORMAL HIGH
Sodium: 136

## 2011-03-28 ENCOUNTER — Encounter (HOSPITAL_COMMUNITY): Payer: Self-pay | Admitting: Emergency Medicine

## 2011-03-28 ENCOUNTER — Inpatient Hospital Stay (HOSPITAL_COMMUNITY): Payer: Medicare Other

## 2011-03-28 ENCOUNTER — Emergency Department (HOSPITAL_COMMUNITY): Payer: Medicare Other

## 2011-03-28 ENCOUNTER — Other Ambulatory Visit: Payer: Self-pay

## 2011-03-28 ENCOUNTER — Inpatient Hospital Stay (HOSPITAL_COMMUNITY)
Admission: EM | Admit: 2011-03-28 | Discharge: 2011-04-06 | DRG: 637 | Disposition: A | Discharge status: Home / Self Care | Payer: Medicare Other | Attending: Family Medicine | Admitting: Family Medicine

## 2011-03-28 DIAGNOSIS — I674 Hypertensive encephalopathy: Secondary | ICD-10-CM | POA: Diagnosis present

## 2011-03-28 DIAGNOSIS — E876 Hypokalemia: Secondary | ICD-10-CM

## 2011-03-28 DIAGNOSIS — F17200 Nicotine dependence, unspecified, uncomplicated: Secondary | ICD-10-CM | POA: Diagnosis present

## 2011-03-28 DIAGNOSIS — Z794 Long term (current) use of insulin: Secondary | ICD-10-CM

## 2011-03-28 DIAGNOSIS — G629 Polyneuropathy, unspecified: Secondary | ICD-10-CM

## 2011-03-28 DIAGNOSIS — N189 Chronic kidney disease, unspecified: Secondary | ICD-10-CM | POA: Diagnosis present

## 2011-03-28 DIAGNOSIS — I10 Essential (primary) hypertension: Secondary | ICD-10-CM

## 2011-03-28 DIAGNOSIS — E1149 Type 2 diabetes mellitus with other diabetic neurological complication: Secondary | ICD-10-CM | POA: Diagnosis present

## 2011-03-28 DIAGNOSIS — I2510 Atherosclerotic heart disease of native coronary artery without angina pectoris: Secondary | ICD-10-CM | POA: Diagnosis present

## 2011-03-28 DIAGNOSIS — E1142 Type 2 diabetes mellitus with diabetic polyneuropathy: Secondary | ICD-10-CM | POA: Diagnosis present

## 2011-03-28 DIAGNOSIS — I129 Hypertensive chronic kidney disease with stage 1 through stage 4 chronic kidney disease, or unspecified chronic kidney disease: Secondary | ICD-10-CM | POA: Diagnosis present

## 2011-03-28 DIAGNOSIS — Z720 Tobacco use: Secondary | ICD-10-CM

## 2011-03-28 DIAGNOSIS — E162 Hypoglycemia, unspecified: Secondary | ICD-10-CM

## 2011-03-28 DIAGNOSIS — R4182 Altered mental status, unspecified: Secondary | ICD-10-CM

## 2011-03-28 DIAGNOSIS — E119 Type 2 diabetes mellitus without complications: Secondary | ICD-10-CM

## 2011-03-28 DIAGNOSIS — J690 Pneumonitis due to inhalation of food and vomit: Secondary | ICD-10-CM | POA: Diagnosis present

## 2011-03-28 DIAGNOSIS — I739 Peripheral vascular disease, unspecified: Secondary | ICD-10-CM

## 2011-03-28 DIAGNOSIS — I150 Renovascular hypertension: Secondary | ICD-10-CM

## 2011-03-28 DIAGNOSIS — E1169 Type 2 diabetes mellitus with other specified complication: Principal | ICD-10-CM | POA: Diagnosis present

## 2011-03-28 HISTORY — DX: Essential (primary) hypertension: I10

## 2011-03-28 HISTORY — DX: Renovascular hypertension: I15.0

## 2011-03-28 HISTORY — DX: Atherosclerotic heart disease of native coronary artery without angina pectoris: I25.10

## 2011-03-28 HISTORY — DX: Peripheral vascular disease, unspecified: I73.9

## 2011-03-28 HISTORY — DX: Disorder of kidney and ureter, unspecified: N28.9

## 2011-03-28 HISTORY — DX: Other intervertebral disc displacement, lumbar region: M51.26

## 2011-03-28 LAB — URINALYSIS, ROUTINE W REFLEX MICROSCOPIC
Bilirubin Urine: NEGATIVE
Glucose, UA: NEGATIVE mg/dL
Specific Gravity, Urine: 1.008 (ref 1.005–1.030)
pH: 7.5 (ref 5.0–8.0)

## 2011-03-28 LAB — CBC
HCT: 43.1 % (ref 36.0–46.0)
Hemoglobin: 14.9 g/dL (ref 12.0–15.0)
MCH: 33 pg (ref 26.0–34.0)
MCHC: 34.6 g/dL (ref 30.0–36.0)
MCV: 95.4 fL (ref 78.0–100.0)

## 2011-03-28 LAB — DIFFERENTIAL
Basophils Relative: 0 % (ref 0–1)
Eosinophils Absolute: 0 10*3/uL (ref 0.0–0.7)
Eosinophils Relative: 0 % (ref 0–5)
Monocytes Absolute: 0.2 10*3/uL (ref 0.1–1.0)
Monocytes Relative: 5 % (ref 3–12)
Neutro Abs: 3.2 10*3/uL (ref 1.7–7.7)

## 2011-03-28 LAB — GLUCOSE, CAPILLARY: Glucose-Capillary: 106 mg/dL — ABNORMAL HIGH (ref 70–99)

## 2011-03-28 LAB — BASIC METABOLIC PANEL
BUN: 7 mg/dL (ref 6–23)
Chloride: 101 mEq/L (ref 96–112)
GFR calc non Af Amer: 82 mL/min — ABNORMAL LOW (ref >90)
Glucose, Bld: 87 mg/dL (ref 70–99)
Potassium: 2 mEq/L — CL (ref 3.5–5.1)
Sodium: 145 mEq/L (ref 135–145)

## 2011-03-28 LAB — CORTISOL: Cortisol, Plasma: 6.5 ug/dL

## 2011-03-28 LAB — URINE CULTURE

## 2011-03-28 LAB — URINE MICROSCOPIC-ADD ON

## 2011-03-28 MED ORDER — CLONIDINE HCL 0.2 MG PO TABS
0.2000 mg | ORAL_TABLET | Freq: Three times a day (TID) | ORAL | Status: DC
  Administered 2011-03-28 – 2011-04-01 (×12): 0.2 mg via ORAL
  Filled 2011-03-28 (×2): qty 1
  Filled 2011-03-28: qty 2
  Filled 2011-03-28 (×5): qty 1
  Filled 2011-03-28: qty 2
  Filled 2011-03-28 (×5): qty 1

## 2011-03-28 MED ORDER — ACETAMINOPHEN 325 MG PO TABS
650.0000 mg | ORAL_TABLET | Freq: Four times a day (QID) | ORAL | Status: DC | PRN
  Administered 2011-04-03: 650 mg via ORAL
  Filled 2011-03-28: qty 2

## 2011-03-28 MED ORDER — ASPIRIN 325 MG PO TABS
325.0000 mg | ORAL_TABLET | Freq: Every day | ORAL | Status: DC
  Administered 2011-03-28 – 2011-04-06 (×10): 325 mg via ORAL
  Filled 2011-03-28 (×9): qty 1

## 2011-03-28 MED ORDER — ACETAMINOPHEN 650 MG RE SUPP: 650.0000 mg | Freq: Four times a day (QID) | RECTAL | Status: DC | PRN

## 2011-03-28 MED ORDER — DOCUSATE SODIUM 100 MG PO CAPS
100.0000 mg | ORAL_CAPSULE | Freq: Two times a day (BID) | ORAL | Status: DC
  Administered 2011-03-28 – 2011-04-06 (×18): 100 mg via ORAL
  Filled 2011-03-28 (×19): qty 1

## 2011-03-28 MED ORDER — OXYCODONE HCL 5 MG PO TABS
5.0000 mg | ORAL_TABLET | ORAL | Status: DC | PRN
  Administered 2011-04-03 – 2011-04-05 (×4): 5 mg via ORAL
  Filled 2011-03-28 (×4): qty 1

## 2011-03-28 MED ORDER — ADULT MULTIVITAMIN W/MINERALS CH
1.0000 | ORAL_TABLET | Freq: Every day | ORAL | Status: DC
  Administered 2011-03-29 – 2011-04-06 (×9): 1 via ORAL
  Filled 2011-03-28 (×9): qty 1

## 2011-03-28 MED ORDER — AMLODIPINE BESYLATE 5 MG PO TABS
5.0000 mg | ORAL_TABLET | Freq: Every day | ORAL | Status: DC
  Filled 2011-03-28: qty 1

## 2011-03-28 MED ORDER — SODIUM CHLORIDE 0.9 % IJ SOLN: 3.0000 mL | INTRAMUSCULAR | Status: DC | PRN

## 2011-03-28 MED ORDER — LABETALOL HCL 5 MG/ML IV SOLN
0.5000 mg/min | INTRAVENOUS | Status: DC
  Administered 2011-03-28: 0.5 mg/min via INTRAVENOUS
  Filled 2011-03-28 (×2): qty 100

## 2011-03-28 MED ORDER — ALUM & MAG HYDROXIDE-SIMETH 200-200-20 MG/5ML PO SUSP: 30.0000 mL | Freq: Four times a day (QID) | ORAL | Status: DC | PRN

## 2011-03-28 MED ORDER — PANTOPRAZOLE SODIUM 40 MG PO TBEC
40.0000 mg | DELAYED_RELEASE_TABLET | Freq: Every day | ORAL | Status: DC
  Administered 2011-03-28 – 2011-04-06 (×9): 40 mg via ORAL
  Filled 2011-03-28 (×10): qty 1

## 2011-03-28 MED ORDER — HYDRALAZINE HCL 20 MG/ML IJ SOLN
10.0000 mg | Freq: Three times a day (TID) | INTRAMUSCULAR | Status: DC | PRN
  Administered 2011-03-28 – 2011-03-30 (×2): 10 mg via INTRAVENOUS
  Filled 2011-03-28: qty 1
  Filled 2011-03-28: qty 0.5

## 2011-03-28 MED ORDER — INSULIN ASPART 100 UNIT/ML ~~LOC~~ SOLN
0.0000 [IU] | Freq: Three times a day (TID) | SUBCUTANEOUS | Status: DC
  Administered 2011-03-29: 2 [IU] via SUBCUTANEOUS
  Administered 2011-03-29: 5 [IU] via SUBCUTANEOUS
  Administered 2011-03-30 (×2): 2 [IU] via SUBCUTANEOUS
  Administered 2011-03-31: 1 [IU] via SUBCUTANEOUS
  Administered 2011-03-31: 3 [IU] via SUBCUTANEOUS
  Administered 2011-04-01: 2 [IU] via SUBCUTANEOUS
  Administered 2011-04-02: 1 [IU] via SUBCUTANEOUS
  Administered 2011-04-02: 3 [IU] via SUBCUTANEOUS
  Administered 2011-04-03: 2 [IU] via SUBCUTANEOUS
  Administered 2011-04-03: 0 [IU] via SUBCUTANEOUS
  Administered 2011-04-03 – 2011-04-04 (×3): 1 [IU] via SUBCUTANEOUS
  Administered 2011-04-04 – 2011-04-05 (×2): 2 [IU] via SUBCUTANEOUS
  Administered 2011-04-05: 1 [IU] via SUBCUTANEOUS
  Administered 2011-04-05: 2 [IU] via SUBCUTANEOUS
  Administered 2011-04-06: 1 [IU] via SUBCUTANEOUS
  Administered 2011-04-06: 7 [IU] via SUBCUTANEOUS
  Filled 2011-03-28: qty 3

## 2011-03-28 MED ORDER — SODIUM CHLORIDE 0.9 % IV SOLN
250.0000 mL | INTRAVENOUS | Status: DC | PRN
  Administered 2011-03-30: 250 mL via INTRAVENOUS

## 2011-03-28 MED ORDER — ENOXAPARIN SODIUM 40 MG/0.4ML ~~LOC~~ SOLN
40.0000 mg | Freq: Every day | SUBCUTANEOUS | Status: DC
  Administered 2011-03-30 – 2011-04-05 (×8): 40 mg via SUBCUTANEOUS
  Filled 2011-03-28 (×9): qty 0.4

## 2011-03-28 MED ORDER — ONDANSETRON HCL 4 MG PO TABS: 4.0000 mg | ORAL_TABLET | Freq: Four times a day (QID) | ORAL | Status: DC | PRN

## 2011-03-28 MED ORDER — SODIUM CHLORIDE 0.9 % IJ SOLN
3.0000 mL | Freq: Two times a day (BID) | INTRAMUSCULAR | Status: DC
  Administered 2011-03-29 – 2011-04-06 (×14): 3 mL via INTRAVENOUS

## 2011-03-28 MED ORDER — NICOTINE 21 MG/24HR TD PT24
21.0000 mg | MEDICATED_PATCH | Freq: Every day | TRANSDERMAL | Status: DC
  Administered 2011-03-29 – 2011-04-04 (×7): 21 mg via TRANSDERMAL
  Filled 2011-03-28 (×9): qty 1

## 2011-03-28 MED ORDER — POTASSIUM CHLORIDE CRYS ER 20 MEQ PO TBCR
40.0000 meq | EXTENDED_RELEASE_TABLET | Freq: Once | ORAL | Status: AC
  Administered 2011-03-28: 40 meq via ORAL
  Filled 2011-03-28: qty 2

## 2011-03-28 MED ORDER — ALISKIREN FUMARATE 150 MG PO TABS
150.0000 mg | ORAL_TABLET | Freq: Every day | ORAL | Status: DC
  Administered 2011-03-29 – 2011-04-02 (×5): 150 mg via ORAL
  Filled 2011-03-28 (×5): qty 1

## 2011-03-28 MED ORDER — POTASSIUM CHLORIDE 10 MEQ/100ML IV SOLN
10.0000 meq | Freq: Once | INTRAVENOUS | Status: AC
  Administered 2011-03-28: 10 meq via INTRAVENOUS
  Filled 2011-03-28: qty 100

## 2011-03-28 MED ORDER — CLONIDINE HCL 0.1 MG PO TABS
0.2000 mg | ORAL_TABLET | Freq: Two times a day (BID) | ORAL | Status: DC
  Administered 2011-03-28: 0.2 mg via ORAL

## 2011-03-28 MED ORDER — SODIUM CHLORIDE 0.9 % IV SOLN
Freq: Once | INTRAVENOUS | Status: AC
  Administered 2011-03-28: 10:00:00 via INTRAVENOUS

## 2011-03-28 MED ORDER — ALISKIREN FUMARATE 150 MG PO TABS
150.0000 mg | ORAL_TABLET | Freq: Every day | ORAL | Status: DC
  Administered 2011-03-28: 150 mg via ORAL

## 2011-03-28 MED ORDER — INSULIN DETEMIR 100 UNIT/ML ~~LOC~~ SOLN: 6.0000 [IU] | Freq: Every day | SUBCUTANEOUS | Status: DC

## 2011-03-28 MED ORDER — GABAPENTIN 100 MG PO CAPS
100.0000 mg | ORAL_CAPSULE | Freq: Three times a day (TID) | ORAL | Status: DC
  Administered 2011-03-28 – 2011-04-06 (×26): 100 mg via ORAL
  Filled 2011-03-28 (×28): qty 1

## 2011-03-28 MED ORDER — DEXTROSE 50 % IV SOLN
INTRAVENOUS | Status: AC
  Administered 2011-03-28: 09:00:00
  Filled 2011-03-28: qty 50

## 2011-03-28 MED ORDER — ONDANSETRON HCL 4 MG/2ML IJ SOLN: 4.0000 mg | Freq: Four times a day (QID) | INTRAMUSCULAR | Status: DC | PRN

## 2011-03-28 MED ORDER — CILOSTAZOL 100 MG PO TABS
100.0000 mg | ORAL_TABLET | Freq: Two times a day (BID) | ORAL | Status: DC
  Administered 2011-03-28 – 2011-04-06 (×19): 100 mg via ORAL
  Filled 2011-03-28 (×20): qty 1

## 2011-03-28 MED ORDER — ALPRAZOLAM 0.5 MG PO TABS
0.5000 mg | ORAL_TABLET | Freq: Every evening | ORAL | Status: DC | PRN
  Administered 2011-03-28 – 2011-04-04 (×6): 0.5 mg via ORAL
  Filled 2011-03-28 (×5): qty 1

## 2011-03-28 NOTE — Progress Notes (Signed)
Patient's BP has remained high,m systolic 257 currently, despite getting clonidine/hydralazine/aliskren in ED. Will hold the meds and place on labetalol drip, and admit to SDU- to gradually reduce BP. Carmen Skillman,MD [phone].

## 2011-03-28 NOTE — ED Provider Notes (Signed)
History     CSN: [id number]  Arrival date & time 03/28/11  4098   First MD Initiated Contact with Patient 03/28/11 [phone]      No chief complaint on file.   (Consider location/radiation/quality/duration/timing/severity/associated sxs/prior treatment) The history is provided by the patient and a relative.   patient here from home after being found unresponsive. EMS was called patient blood sugar was less than 40. Was given and after D50 and patient was returned to her baseline. She does have a history of diabetes. Had a light meal last night for dinner. Patient's blood pressure was also noted to be elevated. She has not taken her antihypertensives this morning. She denies chest pain, shortness of breath, abdominal pain. No headache or fevers noted. No recent cough or congestion. She did have her antihypertensive medication adjusted by her physician last week and has been having trouble with blood pressure control  No past medical history on file.  No past surgical history on file.  No family history on file.  History  Substance Use Topics  . Smoking status: Not on file  . Smokeless tobacco: Not on file  . Alcohol Use: Not on file    OB History    No data available      Review of Systems  All other systems reviewed and are negative.    Allergies  Review of patient's allergies indicates not on file.  Home Medications  No current outpatient prescriptions on file.  BP 249/104  Pulse 92  Temp(Src) 97.7 F (36.5 C) (Oral)  Resp 20  SpO2 96%  Physical Exam  Nursing note and vitals reviewed. Constitutional: She is oriented to person, place, and time. She appears well-developed and well-nourished.  Non-toxic appearance. No distress.  HENT:  Head: Normocephalic and atraumatic.  Eyes: Conjunctivae, EOM and lids are normal. Pupils are equal, round, and reactive to light.  Neck: Normal range of motion. Neck supple. No tracheal deviation present. No mass present.    Cardiovascular: Normal rate, regular rhythm and normal heart sounds.  Exam reveals no gallop.   No murmur heard. Pulmonary/Chest: Effort normal and breath sounds normal. No stridor. No respiratory distress. She has no decreased breath sounds. She has no wheezes. She has no rhonchi. She has no rales.  Abdominal: Soft. Normal appearance and bowel sounds are normal. She exhibits no distension. There is no tenderness. There is no rebound and no CVA tenderness.  Musculoskeletal: Normal range of motion. She exhibits no edema and no tenderness.       Right lower leg: She exhibits tenderness.       Left lower leg: She exhibits tenderness.  Neurological: She is alert and oriented to person, place, and time. She has normal strength. No cranial nerve deficit or sensory deficit. GCS eye subscore is 4. GCS verbal subscore is 5. GCS motor subscore is 6.  Skin: Skin is warm and dry. No abrasion and no rash noted.  Psychiatric: She has a normal mood and affect. Her speech is normal and behavior is normal.    ED Course  Procedures (including critical care time)  Labs Reviewed - No data to display No results found.   No diagnosis found.    MDM   Date: 03/28/2011  Rate: 67  Rhythm: normal sinus rhythm  QRS Axis: normal  Intervals: QT prolonged  ST/T Wave abnormalities: nonspecific ST/T changes  Conduction Disutrbances:none  Narrative Interpretation:   Old EKG Reviewed: changes noted   11:18 AM Patient given potassium for her  hypokalemia. Patient given food. Patient will be admitted for treatment of this patient's regular home medications restarted for hypertension spoke with hospitalist and accept the patient       Toy Baker, MD 03/28/11 1121

## 2011-03-28 NOTE — ED Notes (Signed)
Critical K+ 2.0 Dr Freida Busman and Burnett Harry RN aware of results

## 2011-03-28 NOTE — ED Notes (Signed)
ZOX:[id number]<AV> Expected date:03/28/11<BR> Expected time: 9:13 AM<BR> Means of arrival:Ambulance<BR> Comments:<BR> EMS hyperglycemia

## 2011-03-28 NOTE — H&P (Signed)
PCP:   No primary provider on file.   Chief Complaint:  Unresponsive this morning, and a low sugar.  HPI: Carmen [name] is a pleasant 78 year old female with htn/dm2/tobacco habituation, who came because her son could not woke her up this morning. She normally wokes up at 430am, but she was still not up at 8am. Her daughter who calls her every morning could not get a response either. The family found her sugar to be 29 mg/dl. EMS gave her D50W at site and she is now close to her baseline but seems slow in speech. In ED her BP was 249/100 systolic, but patient states she hadn't taken her meds this morning. At the time of my evaluation her BP has improved to 165 systolic after she got her morning meds. She was also found to have potasium of 2, being replenished by the ER. She states she has a chronically low potassium per her doctor. She complaints that her feet are numb and sometimes painful. Denies headache or chest pain, or cough. She seems to eat very little. Denies feeling unwell in preceding days.  Review of Systems:  The patient denies anorexia, fever, weight loss,, vision loss, decreased hearing, hoarseness, chest pain, syncope, dyspnea on exertion, peripheral edema, balance deficits, hemoptysis, abdominal pain, melena, hematochezia, severe indigestion/heartburn, hematuria, incontinence, genital sores, muscle weakness, suspicious skin lesions, transient blindness, difficulty walking, depression, unusual weight change, abnormal bleeding, enlarged lymph nodes, angioedema, and breast masses.  Past Medical History: Past Medical History  Diagnosis Date  . Diabetes mellitus   . Hypertension   . Coronary artery disease   . Renal disorder   . Herniated lumbar intervertebral disc   . Cataract     cataract surgery scheduled for 03/31/11, 2 - left eye, 1 - right eye   Past Surgical History  Procedure Date  . Appendectomy   . Abdominal hysterectomy   . Ureteral stent placement   . Carotid  endarterectomy   . Laminectomy     Medications: Prior to Admission medications   Medication Sig Start Date End Date Taking? Authorizing Provider  aliskiren (TEKTURNA) 150 MG tablet Take 150 mg by mouth daily.   Yes Historical Provider, MD  ALPRAZolam Prudy Feeler) 0.5 MG tablet Take 0.5 mg by mouth at bedtime as needed.   Yes Historical Provider, MD  aspirin 325 MG tablet Take 325 mg by mouth daily.   Yes Historical Provider, MD  cilostazol (PLETAL) 100 MG tablet Take 100 mg by mouth 2 (two) times daily.   Yes Historical Provider, MD  cloNIDine (CATAPRES) 0.2 MG tablet Take 0.2 mg by mouth 2 (two) times daily.   Yes Historical Provider, MD  insulin detemir (LEVEMIR) 100 UNIT/ML injection Inject 10-20 Units into the skin at bedtime. 20 units in the morning and 10 units at bedtime   Yes Historical Provider, MD    Allergies:  No Known Allergies  Social History:  reports that she has been smoking.  She has never used smokeless tobacco. She reports that she does not drink alcohol. Her drug history not on file.   Family History: No family history on file.  Physical Exam: Filed Vitals:   03/28/11 0939 03/28/11 1023 03/28/11 1115  BP: 249/104  167/70  Pulse: 92  58  Temp: 97.7 F (36.5 C) 96.7 F (35.9 C)   TempSrc: Oral Rectal   Resp: 20  20  SpO2: 96%  94%   Seems slow but otherwise coherent.  HEENT- PEERRLA. No JVD. No carotid  bruits. RS- lungs clear. CVS- S1S2.RRR. Abdomen- soft, non tender. +BS. CNS- grossly intact. Extremities- couldn't  Feel dorsalis perdis pulses that well bilaterally. No edema.   Labs on Admission:   Basename 03/28/11 1000  NA 145  K 2.0*  CL 101  CO2 34*  GLUCOSE 87  BUN 7  CREATININE 0.72  CALCIUM 8.6  MG --  PHOS --   No results found for this basename: AST:2,ALT:2,ALKPHOS:2,BILITOT:2,PROT:2,ALBUMIN:2 in the last 72 hours No results found for this basename: LIPASE:2,AMYLASE:2 in the last 72 hours  Basename 03/28/11 0955  WBC 4.3  NEUTROABS  3.2  HGB 14.9  HCT 43.1  MCV 95.4  PLT 204   No results found for this basename: CKTOTAL:3,CKMB:3,CKMBINDEX:3,TROPONINI:3 in the last 72 hours No results found for this basename: TSH,T4TOTAL,FREET3,T3FREE,THYROIDAB in the last 72 hours No results found for this basename: VITAMINB12:2,FOLATE:2,FERRITIN:2,TIBC:2,IRON:2,RETICCTPCT:2 in the last 72 hours  Radiological Exams on Admission: No results found.  Assessment Pleasant diabetic lady who came in with an episode of unresponsiveness likely due to hypoglycemia. ?She doesn't seem to have changed her meal pattern. Her sugars from previous hospital encounters have ben in normal range. ?Was this episode of hypoglycemia isolated. She also had accelerated hypertnesion, likely clonidine rebound, and seems to be stabilizing now. The concern would be for other causes of the unresponsiveness, in addition to the hypoglycemia. Plan .Altered mental state- hypoglyemia versus other causes, especially in view of accelerated htn. Check 2decho/carotid duplex/ct brain. Marland KitchenHypoglycemia- reduce levemir dose. Check hba1c. Ssi. May be getting too much insulin. Marland KitchenHTN (hypertension), malignant- resume home meds. .Hypokalemia- ?chronic. Replenish k. Check magnesium level. Defer long term care to patient's pcp. ?Aldosteronism. .Tobacco abuse- cessation counseling given. Nicotine patch. .DM (diabetes mellitus), type 2- as for hypoglycemia. .Peripheral neuropathy vs PVD- ABI- trial of neurontin. Dvt/gi prophylaxis  Condition guarded. Anticipate early d/c.  Carmen [name] [phone]. 03/28/2011, 12:09 PM

## 2011-03-28 NOTE — ED Notes (Signed)
Critical Value results given to Dr. Freida Busman.

## 2011-03-29 DIAGNOSIS — L97509 Non-pressure chronic ulcer of other part of unspecified foot with unspecified severity: Secondary | ICD-10-CM

## 2011-03-29 DIAGNOSIS — R404 Transient alteration of awareness: Secondary | ICD-10-CM

## 2011-03-29 LAB — CBC
MCHC: 34.8 g/dL (ref 30.0–36.0)
Platelets: 175 10*3/uL (ref 150–400)
RDW: 15.2 % (ref 11.5–15.5)
WBC: 4.1 10*3/uL (ref 4.0–10.5)

## 2011-03-29 LAB — BASIC METABOLIC PANEL
BUN: 8 mg/dL (ref 6–23)
Calcium: 7.8 mg/dL — ABNORMAL LOW (ref 8.4–10.5)
Chloride: 104 mEq/L (ref 96–112)
Creatinine, Ser: 0.89 mg/dL (ref 0.50–1.10)
GFR calc Af Amer: 72 mL/min — ABNORMAL LOW (ref >90)
GFR calc non Af Amer: 62 mL/min — ABNORMAL LOW (ref >90)

## 2011-03-29 LAB — CARDIAC PANEL(CRET KIN+CKTOT+MB+TROPI)
CK, MB: 3.4 ng/mL (ref 0.3–4.0)
Relative Index: 1.7 (ref 0.0–2.5)
Total CK: 157 U/L (ref 7–177)
Total CK: 186 U/L — ABNORMAL HIGH (ref 7–177)
Troponin I: <0.3 ng/mL (ref <0.30)

## 2011-03-29 LAB — GLUCOSE, CAPILLARY
Glucose-Capillary: 109 mg/dL — ABNORMAL HIGH (ref 70–99)
Glucose-Capillary: 264 mg/dL — ABNORMAL HIGH (ref 70–99)

## 2011-03-29 LAB — MRSA PCR SCREENING: MRSA by PCR: NEGATIVE

## 2011-03-29 LAB — MAGNESIUM: Magnesium: 1.6 mg/dL (ref 1.5–2.5)

## 2011-03-29 LAB — TSH: TSH: 4.388 u[IU]/mL (ref 0.350–4.500)

## 2011-03-29 LAB — LIPID PANEL
HDL: 63 mg/dL (ref >39)
LDL Cholesterol: 59 mg/dL (ref 0–99)
Total CHOL/HDL Ratio: 2.1 RATIO

## 2011-03-29 LAB — PROTIME-INR
INR: 0.94 (ref 0.00–1.49)
Prothrombin Time: 12.8 seconds (ref 11.6–15.2)

## 2011-03-29 LAB — NA AND K (SODIUM & POTASSIUM), RAND UR
Potassium Urine: 8 mEq/L
Sodium, Ur: 30 mEq/L

## 2011-03-29 LAB — PHOSPHORUS: Phosphorus: 3.1 mg/dL (ref 2.3–4.6)

## 2011-03-29 LAB — APTT: aPTT: 35 seconds (ref 24–37)

## 2011-03-29 LAB — CREATININE, URINE, RANDOM: Creatinine, Urine: 31.5 mg/dL

## 2011-03-29 MED ORDER — POTASSIUM CHLORIDE 20 MEQ/15ML (10%) PO LIQD
40.0000 meq | Freq: Once | ORAL | Status: AC
  Administered 2011-03-29: 40 meq via ORAL
  Filled 2011-03-29: qty 30

## 2011-03-29 MED ORDER — INSULIN DETEMIR 100 UNIT/ML ~~LOC~~ SOLN
6.0000 [IU] | Freq: Every day | SUBCUTANEOUS | Status: DC
  Administered 2011-03-29 – 2011-04-01 (×4): 6 [IU] via SUBCUTANEOUS
  Filled 2011-03-29: qty 3

## 2011-03-29 MED ORDER — MAGNESIUM SULFATE 50 % IJ SOLN
Freq: Once | INTRAVENOUS | Status: AC
  Administered 2011-03-29: 10:00:00 via INTRAVENOUS
  Filled 2011-03-29: qty 50

## 2011-03-29 MED ORDER — MAGNESIUM SULFATE 40 MG/ML IJ SOLN: 2.0000 g | Freq: Once | INTRAMUSCULAR | Status: DC

## 2011-03-29 MED ORDER — AMOXICILLIN-POT CLAVULANATE 875-125 MG PO TABS
1.0000 | ORAL_TABLET | Freq: Two times a day (BID) | ORAL | Status: DC
  Administered 2011-03-29 – 2011-04-02 (×9): 1 via ORAL
  Filled 2011-03-29 (×12): qty 1

## 2011-03-29 NOTE — Progress Notes (Signed)
Physical Therapy Evaluation Patient Details Name: Carmen [name] MRN: [id number] DOB: 03/11/1933 Today's Date: 03/29/2011 Time: 4098-1191 Charge: EVII  Problem List:  Patient Active Problem List  Diagnoses  . Altered mental state  . Hypoglycemia  . HTN (hypertension), malignant  . Hypokalemia  . Tobacco abuse  . DM (diabetes mellitus), type 2  . Peripheral neuropathy    Past Medical History:  Past Medical History  Diagnosis Date  . Diabetes mellitus   . Hypertension   . Coronary artery disease   . Renal disorder   . Herniated lumbar intervertebral disc   . Cataract     cataract surgery scheduled for 03/31/11, 2 - left eye, 1 - right eye   Past Surgical History:  Past Surgical History  Procedure Date  . Appendectomy   . Abdominal hysterectomy   . Ureteral stent placement   . Carotid endarterectomy   . Laminectomy     PT Assessment/Plan/Recommendation PT Assessment Clinical Impression Statement: Pt would benefit from acute PT services in order to improve ambulation and perform stairs to prepare for d/c home with spouse.  Pt with BP of 171/64 mmHg upon sitting EOB, so only ambulated pt to recliner.  Pt likely close to baseline so will work with pt in acute care but no f/u recommendations. PT Recommendation/Assessment: Patient will need skilled PT in the acute care venue PT Problem List: Decreased mobility;Decreased activity tolerance PT Therapy Diagnosis : Difficulty walking PT Plan PT Frequency: Min 3X/week PT Treatment/Interventions: DME instruction;Gait training;Functional mobility training;Patient/family education;Stair training PT Recommendation Follow Up Recommendations: No PT follow up Equipment Recommended: None recommended by PT PT Goals  Acute Rehab PT Goals PT Goal Formulation: With patient Time For Goal Achievement: 7 days Pt will Ambulate: >150 feet;with modified independence PT Goal: Ambulate - Progress: Goal set today Pt will Go Up / Down Stairs:  3-5 stairs;with rail(s);with modified independence PT Goal: Up/Down Stairs - Progress: Goal set today  PT Evaluation Precautions/Restrictions    Prior Functioning  Home Living Lives With: Spouse Type of Home: House Home Layout: One level Home Access: Stairs to enter Entrance Stairs-Rails: Can reach both Entrance Stairs-Number of Steps: 3 Home Adaptive Equipment: Shower chair without back;Straight cane;Walker - rolling Prior Function Level of Independence: Independent with gait Cognition Cognition Arousal/Alertness: Awake/alert Overall Cognitive Status: Appears within functional limits for tasks assessed Sensation/Coordination Sensation Light Touch: Appears Intact Extremity Assessment RLE Assessment RLE Assessment: Within Functional Limits LLE Assessment LLE Assessment: Within Functional Limits Mobility (including Balance) Bed Mobility Bed Mobility: Yes Supine to Sit: 6: Modified independent (Device/Increase time) Transfers Transfers: Yes Sit to Stand: 4: Min assist;From elevated surface Sit to Stand Details (indicate cue type and reason): min/guard 2* lines and oxygen Stand to Sit: 4: Min assist;To chair/3-in-1;With armrests Stand to Sit Details: min/guard, verbal cue for armrests Ambulation/Gait Ambulation/Gait: Yes Ambulation/Gait Assistance: 4: Min assist Ambulation/Gait Assistance Details (indicate cue type and reason): min/guard, slightly unsteady, reports no hx of falls at home, deferred farther ambulation 2* BP 171/64 mmHg upon sitting EOB (RN notified and reports that is a better BP for pt) Ambulation Distance (Feet): 6 Feet Assistive device: None Gait Pattern: Decreased stride length;Step-through pattern    Exercise    End of Session PT - End of Session Activity Tolerance: Patient tolerated treatment well Patient left: in chair;with call bell in reach Nurse Communication: Mobility status for transfers General Behavior During Session: [facility] for tasks  performed Cognition: [facility] for tasks performed  Cleone Hulick,KATHrine E 03/29/2011, 3:10 PM Pager:  [phone]   

## 2011-03-29 NOTE — Plan of Care (Signed)
Problem: Phase I Progression Outcomes Goal: Voiding-avoid urinary catheter unless indicated Outcome: Completed/Met Date Met:  03/29/11 Voids without difficulty

## 2011-03-29 NOTE — Progress Notes (Signed)
  Echocardiogram 2D Echocardiogram has been performed.  Jorje Guild [facility]sey [facility] 03/29/2011, 3:53 PM

## 2011-03-29 NOTE — Plan of Care (Signed)
Problem: Phase I Progression Outcomes Goal: Pain controlled with appropriate interventions Outcome: Completed/Met Date Met:  03/29/11 No c/o's pain

## 2011-03-29 NOTE — Progress Notes (Addendum)
*  PRELIMINARY RESULTS* Carotid duplex has been performed.  Bilateral:  No evidence of hemodynamically significant internal carotid artery stenosis.   Vertebral artery flow is antegrade.      Carmen [name] 03/29/2011, 12:06 PM  ABI's performed at bedside.. Right ABI 0.62  Moderate reduction   Left ABI  0.98  With in normal range  Florestine Avers RVT

## 2011-03-29 NOTE — Plan of Care (Signed)
Problem: Phase I Progression Outcomes Goal: Initial discharge plan identified Outcome: Progressing Pt wants sw consult to arrange some assistance at home with daily cares ie bathing and hair washing.

## 2011-03-29 NOTE — Progress Notes (Signed)
Ms Bench's blood pressure has stabilized on low dose labetalol. She is coughing. It is conceivable that she had hypertensive encephalopathy. She probably developed aspiration pneumonitis during the episode of unresponsiveness. i doubt that she had pulmonary edema, but with BP>250 systolic, it is also possible. She feels better today. She does not recall that i admitted her in the ED yesterday.    1. Hypokalemia     Past Medical History  Diagnosis Date  . Diabetes mellitus   . Hypertension   . Coronary artery disease   . Renal disorder   . Herniated lumbar intervertebral disc   . Cataract     cataract surgery scheduled for 03/31/11, 2 - left eye, 1 - right eye   Current Facility-Administered Medications  Medication Dose Route Frequency Provider Last Rate Last Dose  . 0.9 %  sodium chloride infusion   Intravenous Once Toy Baker, MD 125 mL/hr at 03/28/11 1021    . 0.9 %  sodium chloride infusion  250 mL Intravenous PRN Adrianna Dudas, MD      . acetaminophen (TYLENOL) tablet 650 mg  650 mg Oral Q6H PRN Kire Ferg, MD       Or  . acetaminophen (TYLENOL) suppository 650 mg  650 mg Rectal Q6H PRN Shaena Parkerson, MD      . aliskiren (TEKTURNA) tablet 150 mg  150 mg Oral Daily Sherald Balbuena, MD      . ALPRAZolam Prudy Feeler) tablet 0.5 mg  0.5 mg Oral QHS PRN Morena Mckissack, MD   0.5 mg at 03/28/11 2351  . alum & mag hydroxide-simeth (MAALOX/MYLANTA) 200-200-20 MG/5ML suspension 30 mL  30 mL Oral Q6H PRN Kien Mirsky, MD      . amoxicillin-clavulanate (AUGMENTIN) 875-125 MG per tablet 1 tablet  1 tablet Oral Q12H Wendelin Bradt, MD      . aspirin tablet 325 mg  325 mg Oral Daily Aubrianne Molyneux, MD   325 mg at 03/28/11 0939  . cilostazol (PLETAL) tablet 100 mg  100 mg Oral BID Karry Causer, MD   100 mg at 03/28/11 2345  . cloNIDine (CATAPRES) tablet 0.2 mg  0.2 mg Oral TID Huel Centola, MD   0.2 mg at 03/28/11 2345  . dextrose 50 % solution           . docusate sodium (COLACE) capsule 100  mg  100 mg Oral BID Ludie Pavlik, MD   100 mg at 03/28/11 2345  . enoxaparin (LOVENOX) injection 40 mg  40 mg Subcutaneous QHS Laneya Gasaway, MD      . gabapentin (NEURONTIN) capsule 100 mg  100 mg Oral TID Jevan Gaunt, MD   100 mg at 03/28/11 2345  . hydrALAZINE (APRESOLINE) injection 10 mg  10 mg Intravenous Q8H PRN Tina Temme, MD   10 mg at 03/28/11 1828  . insulin aspart (novoLOG) injection 0-9 Units  0-9 Units Subcutaneous TID WC Mary A. Lynch, NP      . insulin detemir (LEVEMIR) injection 6 Units  6 Units Subcutaneous QHS Mary A. Burnadette Peter, NP      . mulitivitamin with minerals tablet 1 tablet  1 tablet Oral Daily Carleah Yablonski, MD      . nicotine (NICODERM CQ - dosed in mg/24 hours) patch 21 mg  21 mg Transdermal Daily Angline Schweigert, MD      . ondansetron (ZOFRAN) tablet 4 mg  4 mg Oral Q6H PRN Kyllian Clingerman, MD       Or  . ondansetron (ZOFRAN) injection 4 mg  4 mg Intravenous Q6H PRN Sakeenah Valcarcel, MD      . oxyCODONE (Oxy IR/ROXICODONE) immediate release tablet 5 mg  5 mg Oral Q4H PRN Morrigan Wickens, MD      . pantoprazole (PROTONIX) EC tablet 40 mg  40 mg Oral Q1200 Kashara Blocher, MD   40 mg at 03/28/11 2345  . potassium chloride 10 mEq in 100 mL IVPB  10 mEq Intravenous Once Toy Baker, MD   10 mEq at 03/28/11 1213  . potassium chloride SA (K-DUR,KLOR-CON) CR tablet 40 mEq  40 mEq Oral Once Toy Baker, MD   40 mEq at 03/28/11 1154  . sodium chloride 0.9 % injection 3 mL  3 mL Intravenous Q12H Chanee Henrickson, MD      . sodium chloride 0.9 % injection 3 mL  3 mL Intravenous PRN Imanie Darrow, MD      . DISCONTD: aliskiren (TEKTURNA) tablet 150 mg  150 mg Oral Daily Maleea Camilo, MD   150 mg at 03/28/11 0939  . DISCONTD: amLODipine (NORVASC) tablet 5 mg  5 mg Oral Daily Lodie Waheed, MD      . DISCONTD: cloNIDine (CATAPRES) tablet 0.2 mg  0.2 mg Oral BID Zahari Xiang, MD   0.2 mg at 03/28/11 0939  . DISCONTD: insulin detemir (LEVEMIR) injection 6-10 Units  6-10 Units  Subcutaneous QHS Lore Polka, MD      . DISCONTD: labetalol (NORMODYNE,TRANDATE) 4 mg/mL in dextrose 5 % 125 mL infusion  0.5-3 mg/min Intravenous Titrated Ellinore Merced, MD 15 mL/hr at 03/29/11 0300 1 mg/min at 03/29/11 0300   No Known Allergies Active Problems:  Altered mental state  Hypoglycemia  HTN (hypertension), malignant  Hypokalemia  Tobacco abuse  DM (diabetes mellitus), type 2  Peripheral neuropathy   Vital signs in last 24 hours: Temp:  [96.7 F (35.9 C)-98.2 F (36.8 C)] 98.2 F (36.8 C) (01/21 0400) Pulse Rate:  [58-92] 71  (01/21 0700) Resp:  [15-25] 19  (01/21 0700) BP: (147-257)/(40-117) 162/50 mmHg (01/21 0700) SpO2:  [91 %-100 %] 95 % (01/21 0700) Weight:  [65.3 kg (143 lb 15.4 oz)] 65.3 kg (143 lb 15.4 oz) (01/21 0000) Weight change:  Last BM Date: 03/29/11  Intake/Output from previous day: 01/20 0701 - 01/21 0700 In: 156.8 [I.V.:156.8] Out: 850 [Urine:850] Intake/Output this shift:    Lab Results:  Basename 03/29/11 0715 03/28/11 0955  WBC 4.1 4.3  HGB 11.9* 14.9  HCT 34.2* 43.1  PLT 175 204   BMET  Basename 03/28/11 1000  NA 145  K 2.0*  CL 101  CO2 34*  GLUCOSE 87  BUN 7  CREATININE 0.72  CALCIUM 8.6    Studies/Results: Ct Head Wo Contrast  03/28/2011  *RADIOLOGY REPORT*  Clinical Data: Altered mental status.  Headache.  CT HEAD WITHOUT CONTRAST  Technique:  Contiguous axial images were obtained from the base of the skull through the vertex without contrast.  Comparison: Head CT scan 04/11/2009.  Findings: The brain is atrophic with chronic microvascular ischemic change.  Remote lacunar infarction left basal ganglia noted.  No evidence of acute abnormality including infarction, hemorrhage, mass lesion, mass effect, midline shift or abnormal extra-axial fluid collection.  Are small amount of mastoid fluid on the right noted.  IMPRESSION: No acute finding.  Original Report Authenticated By: Bernadene Bell. Maricela Curet, M.D.   Portable Chest 1  View  03/29/2011  *RADIOLOGY REPORT*  Clinical Data: Altered mental status.  PORTABLE CHEST - 1 VIEW  Comparison: Chest radiograph performed  04/13/2009  Findings: Mild right basilar airspace opacity likely reflects atelectasis.  A calcified granuloma is again noted at the left midlung zone.  Vascular congestion is seen; mildly increased interstitial markings are noted.  This could conceivably reflect minimal interstitial edema.  No pleural effusion or pneumothorax is identified.  The cardiomediastinal silhouette is mildly enlarged.  Calcification is noted within the aortic arch.  A vascular stent is noted overlying the right lung apex.  No acute osseous abnormalities are seen.  IMPRESSION:  1.  Mild right basilar airspace opacity likely reflects atelectasis. 2.  Vascular congestion and mild cardiomegaly noted; mildly increased interstitial markings could conceivably reflect minimal interstitial edema.  Original Report Authenticated By: Tonia Ghent, M.D.    Medications: I have reviewed the patient's current medications.   Physical exam GENERAL- alert HEAD- normal atraumatic, no neck masses, normal thyroid, no jvd RESPIRATORY-coughing. CVS- regular rate and rhythm, S1, S2 normal, no murmur, click, rub or gallop ABDOMEN- abdomen is soft without significant tenderness, masses, organomegaly or guarding NEURO- Grossly normal EXTREMITIES- extremities normal, atraumatic, no cyanosis or edema  Plan .Altered mental state- hypoglyemia versus hypertensive encephalopathy or both- Resolved. Follow carotid duplex. .Hypoglycemia- BS mostly in normal range on lower doses of insulin. Follow HbA1C. May need to cut back  levemir doses even further, especially if HbA1C normal. .HTN (hypertension), malignant with acceleration- possible rebound htn due to clonidine withdrawal. Change clonidine frequency to tid. D/C labetalol drip. Transfer to telemetry. .Hypokalemia- ?chronic. Replenish k. Check magnesium level.  Follow aldosterone level. .Aspiration Pneumonitis/Tobacco abuse- Add augmentin to complete 7 days. .DM (diabetes mellitus), type 2- as for hypoglycemia. Follow HbA1c. .Peripheral neuropathy vs PVD-Await  ABI. Continue trial of neurontin.  Dvt/gi prophylaxis  Disposition- transfer to telemetry today.     Atasha Colebank 03/29/2011 7:42 AM Pager: [phone].

## 2011-03-29 NOTE — Plan of Care (Signed)
Pt transferred to [id number] with monitor via wheelchair, pt assisted to bathroom, pt able to ambulate to bed, tele monitor placed on patient by floor rn

## 2011-03-30 LAB — BASIC METABOLIC PANEL
BUN: 8 mg/dL (ref 6–23)
BUN: 8 mg/dL (ref 6–23)
CO2: 32 mEq/L (ref 19–32)
CO2: 29 mEq/L (ref 19–32)
Calcium: 8.4 mg/dL (ref 8.4–10.5)
Calcium: 8.2 mg/dL — ABNORMAL LOW (ref 8.4–10.5)
Creatinine, Ser: 0.86 mg/dL (ref 0.50–1.10)
Creatinine, Ser: 0.94 mg/dL (ref 0.50–1.10)
GFR calc non Af Amer: 64 mL/min — ABNORMAL LOW (ref >90)
GFR calc non Af Amer: 58 mL/min — ABNORMAL LOW (ref >90)
Glucose, Bld: 111 mg/dL — ABNORMAL HIGH (ref 70–99)
Glucose, Bld: 292 mg/dL — ABNORMAL HIGH (ref 70–99)

## 2011-03-30 LAB — CBC
Hemoglobin: 13.5 g/dL (ref 12.0–15.0)
MCH: 32.6 pg (ref 26.0–34.0)
MCHC: 33.7 g/dL (ref 30.0–36.0)
MCV: 96.9 fL (ref 78.0–100.0)
RBC: 4.14 MIL/uL (ref 3.87–5.11)

## 2011-03-30 LAB — GLUCOSE, CAPILLARY
Glucose-Capillary: 183 mg/dL — ABNORMAL HIGH (ref 70–99)
Glucose-Capillary: 300 mg/dL — ABNORMAL HIGH (ref 70–99)

## 2011-03-30 MED ORDER — HYDROCHLOROTHIAZIDE 12.5 MG PO CAPS
12.5000 mg | ORAL_CAPSULE | Freq: Every day | ORAL | Status: DC
  Administered 2011-03-30: 12.5 mg via ORAL
  Filled 2011-03-30 (×2): qty 1

## 2011-03-30 MED ORDER — HYDRALAZINE HCL 20 MG/ML IJ SOLN
10.0000 mg | Freq: Four times a day (QID) | INTRAMUSCULAR | Status: DC | PRN
  Administered 2011-03-30 – 2011-04-04 (×12): 10 mg via INTRAVENOUS
  Administered 2011-04-05: 07:00:00 via INTRAVENOUS
  Administered 2011-04-05 (×2): 10 mg via INTRAVENOUS
  Filled 2011-03-30 (×15): qty 1

## 2011-03-30 MED ORDER — METOPROLOL TARTRATE 12.5 MG HALF TABLET
12.5000 mg | ORAL_TABLET | Freq: Two times a day (BID) | ORAL | Status: DC
  Administered 2011-03-30 – 2011-04-01 (×5): 12.5 mg via ORAL
  Filled 2011-03-30 (×6): qty 1

## 2011-03-30 MED ORDER — POTASSIUM CHLORIDE CRYS ER 20 MEQ PO TBCR
20.0000 meq | EXTENDED_RELEASE_TABLET | Freq: Two times a day (BID) | ORAL | Status: DC
  Administered 2011-03-30: 20 meq via ORAL
  Filled 2011-03-30 (×3): qty 1

## 2011-03-30 MED ORDER — POTASSIUM CHLORIDE 20 MEQ/15ML (10%) PO LIQD
40.0000 meq | Freq: Once | ORAL | Status: AC
  Administered 2011-03-30: 40 meq via ORAL
  Filled 2011-03-30: qty 30

## 2011-03-30 MED ORDER — POTASSIUM CHLORIDE 10 MEQ/100ML IV SOLN
10.0000 meq | INTRAVENOUS | Status: AC
  Administered 2011-03-30 (×3): 10 meq via INTRAVENOUS
  Filled 2011-03-30 (×3): qty 100

## 2011-03-30 NOTE — Progress Notes (Signed)
At 1000 BP was 232/100 HR 71. Md notified with orders to start Metoprolol and modified Hydralazine. Will continue to monitor. Julio Sicks RN

## 2011-03-30 NOTE — Progress Notes (Addendum)
Carmen [name] is a 78 y.o. female patient admitted after being unresponsive first thing in the morning at home. Relatives found her Blood sugar to be 29 mg/dl. She responded to D50w. Apparently, her meals have been quite light. HBA1c pending. She may be takling too much levemir for her poor intake. The levemir dose has been reduced. She has had persistently elevated BP despite being on clonidine and aliskren, which she professes to be compliant with. I discussed with her cardiologist, Dr Jacinto Halim, who knows her pretty well(he faxed a copy of office notes, which I left in chart). He mwentioned that she has RAS and had stent placed about 2 years ago,. She is due for another angiogram- and he can perform that in the office once she is discharged. He felt that if BP remained the only issue, she could be discharged and follow with him in the next 2 days. She also has PVD which Dr Jacinto Halim follows. She continues to smoke. ABI/ performed suggested rightsided disease. Carotid duplex showed insignificant stenosis right carotid. If her potassium(which was 2 at admission) remains ok in am, she could be discharged. Dr Jacinto Halim felt she should continue her aliskren, as this seems to have helped her.  SUBJECTIVE Ok, no complaints.   1. Hypokalemia   2. Altered mental state   3. DM (diabetes mellitus), type 2   4. Peripheral neuropathy     Past Medical History  Diagnosis Date  . Diabetes mellitus   . Hypertension   . Coronary artery disease   . Renal disorder   . Herniated lumbar intervertebral disc   . Cataract     cataract surgery scheduled for 03/31/11, 2 - left eye, 1 - right eye   Current Facility-Administered Medications  Medication Dose Route Frequency Provider Last Rate Last Dose  . 0.9 %  sodium chloride infusion  250 mL Intravenous PRN Carolene Gitto, MD 10 mL/hr at 03/30/11 0650 250 mL at 03/30/11 0650  . acetaminophen (TYLENOL) tablet 650 mg  650 mg Oral Q6H PRN Leone Putman, MD       Or  .  acetaminophen (TYLENOL) suppository 650 mg  650 mg Rectal Q6H PRN Jeffery Gammell, MD      . aliskiren (TEKTURNA) tablet 150 mg  150 mg Oral Daily Shivangi Lutz, MD   150 mg at 03/30/11 1006  . ALPRAZolam Prudy Feeler) tablet 0.5 mg  0.5 mg Oral QHS PRN Lilienne Weins, MD   0.5 mg at 03/28/11 2351  . alum & mag hydroxide-simeth (MAALOX/MYLANTA) 200-200-20 MG/5ML suspension 30 mL  30 mL Oral Q6H PRN Aleigha Gilani, MD      . amoxicillin-clavulanate (AUGMENTIN) 875-125 MG per tablet 1 tablet  1 tablet Oral Q12H Trystan Akhtar, MD   1 tablet at 03/30/11 2152  . aspirin tablet 325 mg  325 mg Oral Daily Emerald Gehres, MD   325 mg at 03/30/11 1006  . cilostazol (PLETAL) tablet 100 mg  100 mg Oral BID Lathon Adan, MD   100 mg at 03/30/11 2151  . cloNIDine (CATAPRES) tablet 0.2 mg  0.2 mg Oral TID Waseem Suess, MD   0.2 mg at 03/30/11 2152  . docusate sodium (COLACE) capsule 100 mg  100 mg Oral BID Lavaun Greenfield, MD   100 mg at 03/30/11 2152  . enoxaparin (LOVENOX) injection 40 mg  40 mg Subcutaneous QHS Loann Chahal, MD   40 mg at 03/30/11 2152  . gabapentin (NEURONTIN) capsule 100 mg  100 mg Oral TID Conley Canal, MD  100 mg at 03/30/11 2152  . hydrALAZINE (APRESOLINE) injection 10 mg  10 mg Intravenous Q6H PRN Sage Kopera, MD   10 mg at 03/30/11 1837  . hydrochlorothiazide (MICROZIDE) capsule 12.5 mg  12.5 mg Oral Daily Eusebia Grulke, MD   12.5 mg at 03/30/11 2045  . insulin aspart (novoLOG) injection 0-9 Units  0-9 Units Subcutaneous TID WC Mary A. Burnadette Peter, NP   2 Units at 03/30/11 1745  . insulin detemir (LEVEMIR) injection 6 Units  6 Units Subcutaneous QHS Mary A. Burnadette Peter, NP   6 Units at 03/30/11 2152  . metoprolol tartrate (LOPRESSOR) tablet 12.5 mg  12.5 mg Oral BID Mitesh Rosendahl, MD   12.5 mg at 03/30/11 2151  . mulitivitamin with minerals tablet 1 tablet  1 tablet Oral Daily Verdell Kincannon, MD   1 tablet at 03/30/11 1006  . nicotine (NICODERM CQ - dosed in mg/24 hours) patch 21 mg  21 mg Transdermal  Daily Ed Mandich, MD   21 mg at 03/30/11 1007  . ondansetron (ZOFRAN) tablet 4 mg  4 mg Oral Q6H PRN Taygen Acklin, MD       Or  . ondansetron (ZOFRAN) injection 4 mg  4 mg Intravenous Q6H PRN Jada Fass, MD      . oxyCODONE (Oxy IR/ROXICODONE) immediate release tablet 5 mg  5 mg Oral Q4H PRN Kalya Troeger, MD      . pantoprazole (PROTONIX) EC tablet 40 mg  40 mg Oral Q1200 Airiel Oblinger, MD   40 mg at 03/30/11 1208  . potassium chloride 10 mEq in 100 mL IVPB  10 mEq Intravenous Q1 Hr x 3 Divinity Kyler, MD   10 mEq at 03/30/11 0859  . potassium chloride 20 MEQ/15ML (10%) liquid 40 mEq  40 mEq Oral Once Michel Eskelson, MD   40 mEq at 03/30/11 1105  . potassium chloride 20 MEQ/15ML (10%) liquid 40 mEq  40 mEq Oral Once Conley Canal, MD   40 mEq at 03/30/11 1208  . potassium chloride SA (K-DUR,KLOR-CON) CR tablet 20 mEq  20 mEq Oral BID Jonothan Heberle, MD   20 mEq at 03/30/11 2151  . sodium chloride 0.9 % injection 3 mL  3 mL Intravenous Q12H Jailyne Chieffo, MD   3 mL at 03/30/11 2153  . sodium chloride 0.9 % injection 3 mL  3 mL Intravenous PRN Conley Canal, MD      . DISCONTD: hydrALAZINE (APRESOLINE) injection 10 mg  10 mg Intravenous Q8H PRN Dangela How, MD   10 mg at 03/30/11 1610   No Known Allergies Active Problems:  Altered mental state  Hypoglycemia  HTN (hypertension), malignant  Hypokalemia  Tobacco abuse  DM (diabetes mellitus), type 2  Peripheral neuropathy   Vital signs in last 24 hours: Temp:  [97.4 F (36.3 C)-98.8 F (37.1 C)] 98.8 F (37.1 C) (01/22 2118) Pulse Rate:  [67-76] 76  (01/22 2118) Resp:  [18-20] 20  (01/22 2118) BP: (142-232)/(65-100) 193/80 mmHg (01/22 2118) SpO2:  [92 %-93 %] 92 % (01/22 2118) Weight change: -0.118 kg (-4.2 oz) Last BM Date: 03/30/11  Intake/Output from previous day: 01/21 0701 - 01/22 0700 In: 466.3 [P.O.:240; I.V.:126.3; IV Piggyback:100] Out: -  Intake/Output this shift: Total I/O In: 240 [P.O.:240] Out: -   Lab  Results:  Basename 03/30/11 0450 03/29/11 0715  WBC 3.9* 4.1  HGB 13.5 11.9*  HCT 40.1 34.2*  PLT 176 175   BMET  Basename 03/30/11 1340 03/30/11 0450  NA 136 142  K  4.4 2.7*  CL 101 104  CO2 29 32  GLUCOSE 292* 111*  BUN 8 8  CREATININE 0.94 0.86  CALCIUM 8.2* 8.4    Studies/Results: Portable Chest 1 View  03/29/2011  *RADIOLOGY REPORT*  Clinical Data: Altered mental status.  PORTABLE CHEST - 1 VIEW  Comparison: Chest radiograph performed 04/13/2009  Findings: Mild right basilar airspace opacity likely reflects atelectasis.  A calcified granuloma is again noted at the left midlung zone.  Vascular congestion is seen; mildly increased interstitial markings are noted.  This could conceivably reflect minimal interstitial edema.  No pleural effusion or pneumothorax is identified.  The cardiomediastinal silhouette is mildly enlarged.  Calcification is noted within the aortic arch.  A vascular stent is noted overlying the right lung apex.  No acute osseous abnormalities are seen.  IMPRESSION:  1.  Mild right basilar airspace opacity likely reflects atelectasis. 2.  Vascular congestion and mild cardiomegaly noted; mildly increased interstitial markings could conceivably reflect minimal interstitial edema.  Original Report Authenticated By: Tonia Ghent, M.D.    Medications: I have reviewed the patient's current medications.   Physical exam GENERAL- alert HEAD- normal atraumatic, no neck masses, normal thyroid, no jvd RESPIRATORY- appears well, vitals normal, no respiratory distress, acyanotic, normal RR, ear and throat exam is normal, neck free of mass or lymphadenopathy, chest clear, no wheezing, crepitations, rhonchi, normal symmetric air entry CVS- regular rate and rhythm, S1, S2 normal, no murmur, click, rub or gallop ABDOMEN- abdomen is soft without significant tenderness, masses, organomegaly or guarding NEURO- Grossly normal EXTREMITIES- reduced dorsalis perdis pulses, more  marked on right.  Plan .Altered mental state- hypoglyemia versus hypertensive encephalopathy or both- Resolved.  .Hypoglycemia- BS mostly in normal range on lower doses of insulin. Follow HbA1C. May need to cut back levemir doses even further, especially if HbA1C normal.  .HTN (hypertension), malignant with acceleration- concern for recurrence of RAS as he resistant. Follow Dr Jacinto Halim for angiogram outpatient. Added lopressor to her regimen. .Hypokalemia- ?chronic. Replenish k. Check magnesium level. Follow aldosterone level.  .Aspiration Pneumonitis/Tobacco abuse- apparently aspirated while unconscious. Doing ok on augmentin.  .DM (diabetes mellitus), type 2- as for hypoglycemia. Follow HbA1c. .Peripheral neuropathy vs PVD-Follow Dr Jacinto Halim. Smoking cessation counseling given. Continue trial of neurontin/ASA.  Dvt/gi prophylaxis  Disposition- hopefully d/c in am. No need for extensive w/u inhouse. Follow Dr Jacinto Halim soon after d/c. He will be happy to see her inhouse if she stays longer.  Wilmot Quevedo 03/30/2011 10:40 PM Pager: [phone].

## 2011-03-30 NOTE — Progress Notes (Signed)
Physical Therapy Treatment D/C from Acute PT Patient Details Name: Carmen [name] MRN: [id number] DOB: October 24, 1933 Today's Date: 03/30/2011 Time: 4098-1191 Charge: Leonia Reeves PT Assessment/Plan  PT - Assessment/Plan Comments on Treatment Session: Pt reports she feels she is currently now at her baseline.  Pt did not perform stairs but reports she feels comfortable returning home.  Pt agreeable to not require any further acute PT services.  PT to sign off. PT Plan: All goals met and education completed, patient dischaged from PT services Follow Up Recommendations: No PT follow up Equipment Recommended: None recommended by PT PT Goals  Acute Rehab PT Goals PT Goal: Ambulate - Progress: Met PT Goal: Up/Down Stairs - Progress: Other (comment) (not performed but pt feels comfortable with d/c home.)  PT Treatment Precautions/Restrictions    Mobility (including Balance) Bed Mobility Bed Mobility: No (pt up in recliner) Transfers Transfers: Yes Sit to Stand: 6: Modified independent (Device/Increase time) Stand to Sit: 6: Modified independent (Device/Increase time) Ambulation/Gait Ambulation/Gait: Yes Ambulation/Gait Assistance: 6: Modified independent (Device/Increase time) Ambulation/Gait Assistance Details (indicate cue type and reason): pt with slow steady pace which she reports is her normal Ambulation Distance (Feet): 400 Feet Assistive device: None Gait Pattern:  (wide BOS)   Pt BP: Pre-gait 158/70 Post-gait 207/70 3 minutes post-gait 194/69 RN notified of BP.   Exercise    End of Session PT - End of Session Activity Tolerance: Patient tolerated treatment well Patient left: in chair;with call bell in reach General Behavior During Session: [facility] for tasks performed Cognition: [facility] for tasks performed  [facility] E 03/30/2011, 3:57 PM Pager: [phone]

## 2011-03-31 LAB — HEMOGLOBIN A1C
Hgb A1c MFr Bld: 6.7 % — ABNORMAL HIGH (ref <5.7)
Mean Plasma Glucose: 146 mg/dL — ABNORMAL HIGH (ref <117)

## 2011-03-31 LAB — BASIC METABOLIC PANEL
BUN: 11 mg/dL (ref 6–23)
Calcium: 8.4 mg/dL (ref 8.4–10.5)
Chloride: 104 mEq/L (ref 96–112)
Creatinine, Ser: 0.87 mg/dL (ref 0.50–1.10)
GFR calc Af Amer: 74 mL/min — ABNORMAL LOW (ref >90)
GFR calc non Af Amer: 64 mL/min — ABNORMAL LOW (ref >90)

## 2011-03-31 LAB — MAGNESIUM: Magnesium: 1.7 mg/dL (ref 1.5–2.5)

## 2011-03-31 MED ORDER — POTASSIUM CHLORIDE CRYS ER 20 MEQ PO TBCR
40.0000 meq | EXTENDED_RELEASE_TABLET | Freq: Four times a day (QID) | ORAL | Status: DC
  Administered 2011-03-31 – 2011-04-02 (×8): 40 meq via ORAL
  Filled 2011-03-31 (×14): qty 2

## 2011-03-31 NOTE — Progress Notes (Signed)
PROGRESS NOTE  Carmen [name] ZOX:[id number] DOB: 09-19-1933 DOA: 03/28/2011 PCP: No primary provider on file. Cardiologist: Dr. Jacinto Halim  Brief narrative: 78 year old woman who presented via EMS to the emergency department with a history of being found unresponsive. Put sugar noted to be less than 40. Blood pressure was noted be elevated (she had not taken her antihypertensive medication that day). The emergency department blood pressure was noted to be 249/100. Potassium was noted to be 2.  Past medical history: Renal artery stenosis, status post stent placement 2 years ago;  Consultants:  Physical therapy: No followup recommended. No equipment recommended.  Procedures:  January 21: Carotid duplex: No evidence of hemodynamically significant internal carotid artery stenosis. Vertebral artery flow antegrade.  January 21: Bilateral lower extremity ABI: Right ABI 0.62, moderate reduction. Left ABI 0.98, within normal range.  January 21: 2-D echocardiogram: Left ventricle: The cavity size was normal. There was moderate concentric hypertrophy. Systolic function was normal. The estimated ejection fraction was in the range of 60% to 65%. Wall motion was normal; there were no regional wall motion abnormalities.  Antibiotics:  January 21: Augmentin  Interim History: Documentation reviewed. Subjective: Feels well.  Objective: Filed Vitals:   03/31/11 0546 03/31/11 1007 03/31/11 1008 03/31/11 1404  BP: 147/55 150/60  178/67  Pulse: 73  75 69  Temp: 98.7 F (37.1 C)   98.3 F (36.8 C)  TempSrc: Oral   Oral  Resp: 18   18  Height:      Weight:      SpO2: 98%   94%    Intake/Output Summary (Last 24 hours) at 03/31/11 1413 Last data filed at 03/31/11 1300  Gross per 24 hour  Intake   1563 ml  Output      0 ml  Net   1563 ml    Exam:  General: Appears calm and comfortable. Cardiovascular: Regular rate and rhythm. No murmur, rub or gallop. No lower extremity edema. Respiratory:  Clear to auscultation bilaterally. No wheezes, rales or rhonchi. Normal respiratory effort.  Data Reviewed: Basic Metabolic Panel:  Lab 03/31/11 8119 03/30/11 1340 03/30/11 0450 03/29/11 0715 03/28/11 2345 03/28/11 1000  NA 137 136 142 141 -- 145  K 3.0* 4.4 -- -- -- --  CL 104 101 104 104 -- 101  CO2 28 29 32 32 -- 34*  GLUCOSE 97 292* 111* 111* -- 87  BUN 11 8 8 8  -- 7  CREATININE 0.87 0.94 0.86 0.89 -- 0.72  CALCIUM 8.4 8.2* 8.4 7.8* -- 8.6  MG 1.7 -- -- -- 1.6 --  PHOS -- -- -- -- 3.1 --   CBC:  Lab 03/30/11 0450 03/29/11 0715 03/28/11 0955  WBC 3.9* 4.1 4.3  NEUTROABS -- -- 3.2  HGB 13.5 11.9* 14.9  HCT 40.1 34.2* 43.1  MCV 96.9 95.8 95.4  PLT 176 175 204   Cardiac Enzymes:  Lab 03/29/11 1500 03/29/11 0715 03/28/11 2345  CKTOTAL 157 147 186*  CKMB 2.7 3.0 3.4  CKMBINDEX -- -- --  TROPONINI <0.30 <0.30 <0.30   CBG:  Lab 03/31/11 1140 03/31/11 0802 03/30/11 2117 03/30/11 1654 03/30/11 1154  GLUCAP 138* 75 300* 189* 183*    Recent Results (from the past 240 hour(s))  URINE CULTURE     Status: Normal   Collection Time   03/28/11  9:42 AM      Component Value Range Status Comment   Specimen Description URINE, CLEAN CATCH   Final    Special Requests NONE  Final    Setup Time [id number]   Final    Colony Count 65,000 COLONIES/ML   Final    Culture     Final    Value: Multiple bacterial morphotypes present, none predominant. Suggest appropriate recollection if clinically indicated.   Report Status 03/29/2011 FINAL   Final   MRSA PCR SCREENING     Status: Normal   Collection Time   03/29/11 12:04 AM      Component Value Range Status Comment   MRSA by PCR NEGATIVE  NEGATIVE  Final      Studies: Ct Head Wo Contrast  03/28/2011  *RADIOLOGY REPORT*  Clinical Data: Altered mental status.  Headache.  CT HEAD WITHOUT CONTRAST  Technique:  Contiguous axial images were obtained from the base of the skull through the vertex without contrast.  Comparison: Head CT scan  04/11/2009.  Findings: The brain is atrophic with chronic microvascular ischemic change.  Remote lacunar infarction left basal ganglia noted.  No evidence of acute abnormality including infarction, hemorrhage, mass lesion, mass effect, midline shift or abnormal extra-axial fluid collection.  Are small amount of mastoid fluid on the right noted.  IMPRESSION: No acute finding.  Original Report Authenticated By: Bernadene Bell. Maricela Curet, M.D.   Portable Chest 1 View  03/29/2011  *RADIOLOGY REPORT*  Clinical Data: Altered mental status.  PORTABLE CHEST - 1 VIEW  Comparison: Chest radiograph performed 04/13/2009  Findings: Mild right basilar airspace opacity likely reflects atelectasis.  A calcified granuloma is again noted at the left midlung zone.  Vascular congestion is seen; mildly increased interstitial markings are noted.  This could conceivably reflect minimal interstitial edema.  No pleural effusion or pneumothorax is identified.  The cardiomediastinal silhouette is mildly enlarged.  Calcification is noted within the aortic arch.  A vascular stent is noted overlying the right lung apex.  No acute osseous abnormalities are seen.  IMPRESSION:  1.  Mild right basilar airspace opacity likely reflects atelectasis. 2.  Vascular congestion and mild cardiomegaly noted; mildly increased interstitial markings could conceivably reflect minimal interstitial edema.  Original Report Authenticated By: Tonia Ghent, M.D.    Scheduled Meds:   . aliskiren  150 mg Oral Daily  . amoxicillin-clavulanate  1 tablet Oral Q12H  . aspirin  325 mg Oral Daily  . cilostazol  100 mg Oral BID  . cloNIDine  0.2 mg Oral TID  . docusate sodium  100 mg Oral BID  . enoxaparin  40 mg Subcutaneous QHS  . gabapentin  100 mg Oral TID  . insulin aspart  0-9 Units Subcutaneous TID WC  . insulin detemir  6 Units Subcutaneous QHS  . metoprolol tartrate  12.5 mg Oral BID  . mulitivitamin with minerals  1 tablet Oral Daily  . nicotine  21 mg  Transdermal Daily  . pantoprazole  40 mg Oral Q1200  . sodium chloride  3 mL Intravenous Q12H  . DISCONTD: hydrochlorothiazide  12.5 mg Oral Daily  . DISCONTD: potassium chloride  20 mEq Oral BID   Continuous Infusions:    Assessment/Plan: 1. Altered mental status: Episode of unresponsiveness thought to be secondary to hypoglycemia versus hypertensive encephalopathy or both, resolved. CAT scan of the brain, 2-D echocardiogram and carotid duplex was obtained. Unremarkable. 2. Diabetes mellitus type 2/peripheral neuropathy, controlled: Hypoglycemic on admission. Resolved. Adjust Levemir as needed. Hemoglobin A1c 6.7. 3. Accelerated/malignant Hypertension: Probably related to "rebound effect" from missing medications the day of admission. Required transfer to the step down unit on admission for labetalol  effusion. Followup with Dr. Jacinto Halim. 4. Hypokalemia: Continue repletion. Magnesium within normal limits. Aldosterone level can be followed-up in the outpatient setting. 5. Aspiration pneumonitis: Augmentin. 6. Peripheral vascular disease: Stable. 7. Renal artery stenosis: Followup with Dr. Jacinto Halim. 8. Cigarette smoker: Recommend cessation.   Family Communication:  Disposition Plan: Home when improved.   Brendia Sacks, MD  [facility] Pager [phone] 03/31/2011, 2:13 PM    LOS: 3 days

## 2011-04-01 LAB — GLUCOSE, CAPILLARY
Glucose-Capillary: 60 mg/dL — ABNORMAL LOW (ref 70–99)
Glucose-Capillary: 250 mg/dL — ABNORMAL HIGH (ref 70–99)
Glucose-Capillary: 164 mg/dL — ABNORMAL HIGH (ref 70–99)
Glucose-Capillary: 118 mg/dL — ABNORMAL HIGH (ref 70–99)

## 2011-04-01 LAB — BASIC METABOLIC PANEL
CO2: 27 mEq/L (ref 19–32)
Calcium: 8.8 mg/dL (ref 8.4–10.5)
Chloride: 104 mEq/L (ref 96–112)
Creatinine, Ser: 0.91 mg/dL (ref 0.50–1.10)
Glucose, Bld: 118 mg/dL — ABNORMAL HIGH (ref 70–99)

## 2011-04-01 MED ORDER — CLONIDINE HCL 0.3 MG PO TABS
0.3000 mg | ORAL_TABLET | Freq: Three times a day (TID) | ORAL | Status: DC
  Administered 2011-04-01 – 2011-04-06 (×15): 0.3 mg via ORAL
  Filled 2011-04-01 (×17): qty 1

## 2011-04-01 MED ORDER — METOPROLOL TARTRATE 12.5 MG HALF TABLET
12.5000 mg | ORAL_TABLET | Freq: Once | ORAL | Status: AC
  Administered 2011-04-01: 12.5 mg via ORAL
  Filled 2011-04-01: qty 1

## 2011-04-01 MED ORDER — METOPROLOL TARTRATE 25 MG PO TABS
25.0000 mg | ORAL_TABLET | Freq: Two times a day (BID) | ORAL | Status: DC
  Administered 2011-04-01 – 2011-04-06 (×10): 25 mg via ORAL
  Filled 2011-04-01 (×11): qty 1

## 2011-04-01 NOTE — Progress Notes (Signed)
PROGRESS NOTE  Carmen [name] ZOX:[id number] DOB: 1933-07-20 DOA: 03/28/2011 PCP: Gwynneth Aliment, MD, MD Cardiologist: Dr. Jacinto Halim  Brief narrative: 78 year old woman who presented via EMS to the emergency department with a history of being found unresponsive. Put sugar noted to be less than 40. Blood pressure was noted be elevated (she had not taken her antihypertensive medication that day). The emergency department blood pressure was noted to be 249/100. Potassium was noted to be 2.  Past medical history: Renal artery stenosis, status post stent placement 2 years ago;  Consultants:  Physical therapy: No followup recommended. No equipment recommended.  Procedures:  January 21: Carotid duplex: No evidence of hemodynamically significant internal carotid artery stenosis. Vertebral artery flow antegrade.  January 21: Bilateral lower extremity ABI: Right ABI 0.62, moderate reduction. Left ABI 0.98, within normal range.  January 21: 2-D echocardiogram: Left ventricle: The cavity size was normal. There was moderate concentric hypertrophy. Systolic function was normal. The estimated ejection fraction was in the range of 60% to 65%. Wall motion was normal; there were no regional wall motion abnormalities.  Antibiotics:  January 21: Augmentin  Interim History: Documentation reviewed. Markedly hypertensive today. Potassium slowly improving. Blood sugars stable. Subjective: Feels okay. No complaints.  Objective: Filed Vitals:   03/31/11 1404 03/31/11 2306 03/31/11 2335 04/01/11 0603  BP: 178/67 234/90 182/71 196/76  Pulse: 69 61  71  Temp: 98.3 F (36.8 C) 98.1 F (36.7 C)  98.5 F (36.9 C)  TempSrc: Oral Oral  Oral  Resp: 18 16  16   Height:      Weight:      SpO2: 94% 92%  93%    Intake/Output Summary (Last 24 hours) at 04/01/11 1055 Last data filed at 04/01/11 0900  Gross per 24 hour  Intake    840 ml  Output      0 ml  Net    840 ml    Exam:  General: Appears calm and  comfortable. Cardiovascular: Regular rate and rhythm. No murmur, rub or gallop. No lower extremity edema. Respiratory: Clear to auscultation bilaterally. No wheezes, rales or rhonchi. Normal respiratory effort.  Data Reviewed: Basic Metabolic Panel:  Lab 04/01/11 8119 03/31/11 0517 03/30/11 1340 03/30/11 0450 03/29/11 0715 03/28/11 2345  NA 137 137 136 142 141 --  K 3.3* 3.0* -- -- -- --  CL 104 104 101 104 104 --  CO2 27 28 29  32 32 --  GLUCOSE 118* 97 292* 111* 111* --  BUN 12 11 8 8 8  --  CREATININE 0.91 0.87 0.94 0.86 0.89 --  CALCIUM 8.8 8.4 8.2* 8.4 7.8* --  MG -- 1.7 -- -- -- 1.6  PHOS -- -- -- -- -- 3.1   CBC:  Lab 03/30/11 0450 03/29/11 0715 03/28/11 0955  WBC 3.9* 4.1 4.3  NEUTROABS -- -- 3.2  HGB 13.5 11.9* 14.9  HCT 40.1 34.2* 43.1  MCV 96.9 95.8 95.4  PLT 176 175 204   Cardiac Enzymes:  Lab 03/29/11 1500 03/29/11 0715 03/28/11 2345  CKTOTAL 157 147 186*  CKMB 2.7 3.0 3.4  CKMBINDEX -- -- --  TROPONINI <0.30 <0.30 <0.30   CBG:  Lab 04/01/11 0844 04/01/11 0748 03/31/11 2303 03/31/11 1730 03/31/11 1140  GLUCAP 250* 60* 211* 218* 138*    Recent Results (from the past 240 hour(s))  URINE CULTURE     Status: Normal   Collection Time   03/28/11  9:42 AM      Component Value Range Status Comment  Specimen Description URINE, CLEAN CATCH   Final    Special Requests NONE   Final    Setup Time [id number]   Final    Colony Count 65,000 COLONIES/ML   Final    Culture     Final    Value: Multiple bacterial morphotypes present, none predominant. Suggest appropriate recollection if clinically indicated.   Report Status 03/29/2011 FINAL   Final   MRSA PCR SCREENING     Status: Normal   Collection Time   03/29/11 12:04 AM      Component Value Range Status Comment   MRSA by PCR NEGATIVE  NEGATIVE  Final      Studies: Ct Head Wo Contrast  03/28/2011  *RADIOLOGY REPORT*  Clinical Data: Altered mental status.  Headache.  CT HEAD WITHOUT CONTRAST  Technique:   Contiguous axial images were obtained from the base of the skull through the vertex without contrast.  Comparison: Head CT scan 04/11/2009.  Findings: The brain is atrophic with chronic microvascular ischemic change.  Remote lacunar infarction left basal ganglia noted.  No evidence of acute abnormality including infarction, hemorrhage, mass lesion, mass effect, midline shift or abnormal extra-axial fluid collection.  Are small amount of mastoid fluid on the right noted.  IMPRESSION: No acute finding.  Original Report Authenticated By: Bernadene Bell. Maricela Curet, M.D.   Portable Chest 1 View  03/29/2011  *RADIOLOGY REPORT*  Clinical Data: Altered mental status.  PORTABLE CHEST - 1 VIEW  Comparison: Chest radiograph performed 04/13/2009  Findings: Mild right basilar airspace opacity likely reflects atelectasis.  A calcified granuloma is again noted at the left midlung zone.  Vascular congestion is seen; mildly increased interstitial markings are noted.  This could conceivably reflect minimal interstitial edema.  No pleural effusion or pneumothorax is identified.  The cardiomediastinal silhouette is mildly enlarged.  Calcification is noted within the aortic arch.  A vascular stent is noted overlying the right lung apex.  No acute osseous abnormalities are seen.  IMPRESSION:  1.  Mild right basilar airspace opacity likely reflects atelectasis. 2.  Vascular congestion and mild cardiomegaly noted; mildly increased interstitial markings could conceivably reflect minimal interstitial edema.  Original Report Authenticated By: Tonia Ghent, M.D.    Scheduled Meds:    . aliskiren  150 mg Oral Daily  . amoxicillin-clavulanate  1 tablet Oral Q12H  . aspirin  325 mg Oral Daily  . cilostazol  100 mg Oral BID  . cloNIDine  0.2 mg Oral TID  . docusate sodium  100 mg Oral BID  . enoxaparin  40 mg Subcutaneous QHS  . gabapentin  100 mg Oral TID  . insulin aspart  0-9 Units Subcutaneous TID WC  . insulin detemir  6 Units  Subcutaneous QHS  . metoprolol tartrate  12.5 mg Oral BID  . mulitivitamin with minerals  1 tablet Oral Daily  . nicotine  21 mg Transdermal Daily  . pantoprazole  40 mg Oral Q1200  . potassium chloride  40 mEq Oral QID  . sodium chloride  3 mL Intravenous Q12H   Continuous Infusions:    Assessment/Plan: 1. Altered mental status: Episode of unresponsiveness thought to be secondary to hypoglycemia versus hypertensive encephalopathy or both, resolved. CAT scan of the brain, 2-D echocardiogram and carotid duplex was obtained. Unremarkable. 2. Diabetes mellitus type 2/peripheral neuropathy, controlled: Hypoglycemic on admission. Resolved. Continue Levemir. Hemoglobin A1c 6.7. 3. Accelerated/malignant Hypertension: Remains poorly controlled. Continue Aliskiren as well as clonidine and beta blocker therapy. Increase metoprolol. Will discuss with  her cardiologist. 4. Hypokalemia: Continue repletion. Magnesium within normal limits. Aldosterone level can be followed-up in the outpatient setting. 5. Aspiration pneumonitis: Augmentin. No clinical evidence of this. 6. Peripheral vascular disease: Stable. 7. Renal artery stenosis: Followup with Dr. Jacinto Halim. 8. Cigarette smoker: Recommend cessation.  Continue potassium repletion. Main issue at this point his marked hypertension. As this possibly contributed to her presentation and admission will keep hospitalized for now to further adjust. Appreciate input from her cardiologist who knows her well.  Family Communication:  Disposition Plan: Home when improved.   Brendia Sacks, MD  [facility] Pager [phone] 04/01/2011, 10:55 AM    LOS: 4 days

## 2011-04-02 LAB — GLUCOSE, CAPILLARY
Glucose-Capillary: 77 mg/dL (ref 70–99)
Glucose-Capillary: 133 mg/dL — ABNORMAL HIGH (ref 70–99)
Glucose-Capillary: 59 mg/dL — ABNORMAL LOW (ref 70–99)
Glucose-Capillary: 133 mg/dL — ABNORMAL HIGH (ref 70–99)

## 2011-04-02 LAB — BASIC METABOLIC PANEL
Chloride: 109 mEq/L (ref 96–112)
Creatinine, Ser: 0.98 mg/dL (ref 0.50–1.10)
GFR calc Af Amer: 64 mL/min — ABNORMAL LOW (ref >90)
GFR calc non Af Amer: 55 mL/min — ABNORMAL LOW (ref >90)
Potassium: 3.9 mEq/L (ref 3.5–5.1)

## 2011-04-02 MED ORDER — INSULIN DETEMIR 100 UNIT/ML ~~LOC~~ SOLN
4.0000 [IU] | Freq: Every day | SUBCUTANEOUS | Status: DC
  Administered 2011-04-02: 4 [IU] via SUBCUTANEOUS

## 2011-04-02 MED ORDER — HYDRALAZINE HCL 10 MG PO TABS
10.0000 mg | ORAL_TABLET | Freq: Four times a day (QID) | ORAL | Status: DC
  Administered 2011-04-02 – 2011-04-06 (×17): 10 mg via ORAL
  Filled 2011-04-02 (×20): qty 1

## 2011-04-02 MED ORDER — ALISKIREN FUMARATE 150 MG PO TABS
300.0000 mg | ORAL_TABLET | Freq: Every day | ORAL | Status: DC
  Administered 2011-04-03 – 2011-04-06 (×4): 300 mg via ORAL
  Filled 2011-04-02 (×4): qty 2

## 2011-04-02 MED ORDER — POTASSIUM CHLORIDE CRYS ER 20 MEQ PO TBCR
40.0000 meq | EXTENDED_RELEASE_TABLET | Freq: Every day | ORAL | Status: DC
  Administered 2011-04-03 – 2011-04-06 (×4): 40 meq via ORAL
  Filled 2011-04-02 (×4): qty 2

## 2011-04-02 NOTE — Progress Notes (Addendum)
PROGRESS NOTE  Carmen [name] EAV:[id number] DOB: Nov 26, 1933 DOA: 03/28/2011 PCP: Gwynneth Aliment, MD, MD Cardiologist: Dr. Jacinto Halim  Brief narrative: 78 year old woman who presented via EMS to the emergency department with a history of being found unresponsive. Put sugar noted to be less than 40. Blood pressure was noted be elevated (she had not taken her antihypertensive medication that day). The emergency department blood pressure was noted to be 249/100. Potassium was noted to be 2.  Past medical history: Renal artery stenosis, status post stent placement 2 years ago;  Consultants:  Physical therapy: No followup recommended. No equipment recommended.  Procedures:  January 21: Carotid duplex: No evidence of hemodynamically significant internal carotid artery stenosis. Vertebral artery flow antegrade.  January 21: Bilateral lower extremity ABI: Right ABI 0.62, moderate reduction. Left ABI 0.98, within normal range.  January 21: 2-D echocardiogram: Left ventricle: The cavity size was normal. There was moderate concentric hypertrophy. Systolic function was normal. The estimated ejection fraction was in the range of 60% to 65%. Wall motion was normal; there were no regional wall motion abnormalities.  Antibiotics:  January 21: Augmentin  Interim History: Documentation reviewed. Continues to be hypertensive. Hypoglycemic episode this morning. Subjective: Feels okay. No complaints.  Objective: Filed Vitals:   04/01/11 2345 04/02/11 0045 04/02/11 0630 04/02/11 1337  BP: 216/70 181/65 169/84 194/68  Pulse:   67 62  Temp:   98.8 F (37.1 C) 98.3 F (36.8 C)  TempSrc:   Oral Oral  Resp:   20 18  Height:      Weight:      SpO2:   95% 93%    Intake/Output Summary (Last 24 hours) at 04/02/11 1805 Last data filed at 04/02/11 1300  Gross per 24 hour  Intake    840 ml  Output      0 ml  Net    840 ml    Exam:  General: Appears calm and comfortable. Cardiovascular: Regular  rate and rhythm. No murmur, rub or gallop. No lower extremity edema. Telemetry: Sinus rhythm. No acute changes. Respiratory: Clear to auscultation bilaterally. No wheezes, rales or rhonchi. Normal respiratory effort.  Data Reviewed: Basic Metabolic Panel:  Lab 04/02/11 7829 04/01/11 0435 03/31/11 0517 03/30/11 1340 03/30/11 0450 03/28/11 2345  NA 139 137 137 136 142 --  K 3.9 3.3* -- -- -- --  CL 109 104 104 101 104 --  CO2 25 27 28 29  32 --  GLUCOSE 122* 118* 97 292* 111* --  BUN 11 12 11 8 8  --  CREATININE 0.98 0.91 0.87 0.94 0.86 --  CALCIUM 8.6 8.8 8.4 8.2* 8.4 --  MG -- -- 1.7 -- -- 1.6  PHOS -- -- -- -- -- 3.1   CBC:  Lab 03/30/11 0450 03/29/11 0715 03/28/11 0955  WBC 3.9* 4.1 4.3  NEUTROABS -- -- 3.2  HGB 13.5 11.9* 14.9  HCT 40.1 34.2* 43.1  MCV 96.9 95.8 95.4  PLT 176 175 204   Cardiac Enzymes:  Lab 03/29/11 1500 03/29/11 0715 03/28/11 2345  CKTOTAL 157 147 186*  CKMB 2.7 3.0 3.4  CKMBINDEX -- -- --  TROPONINI <0.30 <0.30 <0.30   CBG:  Lab 04/02/11 1729 04/02/11 1153 04/02/11 0848 04/02/11 0754 04/01/11 2137  GLUCAP 203* 133* 77 59* 258*    Recent Results (from the past 240 hour(s))  URINE CULTURE     Status: Normal   Collection Time   03/28/11  9:42 AM      Component Value Range Status Comment  Specimen Description URINE, CLEAN CATCH   Final    Special Requests NONE   Final    Setup Time [id number]   Final    Colony Count 65,000 COLONIES/ML   Final    Culture     Final    Value: Multiple bacterial morphotypes present, none predominant. Suggest appropriate recollection if clinically indicated.   Report Status 03/29/2011 FINAL   Final   MRSA PCR SCREENING     Status: Normal   Collection Time   03/29/11 12:04 AM      Component Value Range Status Comment   MRSA by PCR NEGATIVE  NEGATIVE  Final      Studies: Ct Head Wo Contrast  03/28/2011  *RADIOLOGY REPORT*  Clinical Data: Altered mental status.  Headache.  CT HEAD WITHOUT CONTRAST  Technique:   Contiguous axial images were obtained from the base of the skull through the vertex without contrast.  Comparison: Head CT scan 04/11/2009.  Findings: The brain is atrophic with chronic microvascular ischemic change.  Remote lacunar infarction left basal ganglia noted.  No evidence of acute abnormality including infarction, hemorrhage, mass lesion, mass effect, midline shift or abnormal extra-axial fluid collection.  Are small amount of mastoid fluid on the right noted.  IMPRESSION: No acute finding.  Original Report Authenticated By: Bernadene Bell. Maricela Curet, M.D.   Portable Chest 1 View  03/29/2011  *RADIOLOGY REPORT*  Clinical Data: Altered mental status.  PORTABLE CHEST - 1 VIEW  Comparison: Chest radiograph performed 04/13/2009  Findings: Mild right basilar airspace opacity likely reflects atelectasis.  A calcified granuloma is again noted at the left midlung zone.  Vascular congestion is seen; mildly increased interstitial markings are noted.  This could conceivably reflect minimal interstitial edema.  No pleural effusion or pneumothorax is identified.  The cardiomediastinal silhouette is mildly enlarged.  Calcification is noted within the aortic arch.  A vascular stent is noted overlying the right lung apex.  No acute osseous abnormalities are seen.  IMPRESSION:  1.  Mild right basilar airspace opacity likely reflects atelectasis. 2.  Vascular congestion and mild cardiomegaly noted; mildly increased interstitial markings could conceivably reflect minimal interstitial edema.  Original Report Authenticated By: Tonia Ghent, M.D.    Scheduled Meds:    . aliskiren  300 mg Oral Daily  . amoxicillin-clavulanate  1 tablet Oral Q12H  . aspirin  325 mg Oral Daily  . cilostazol  100 mg Oral BID  . cloNIDine  0.3 mg Oral TID  . docusate sodium  100 mg Oral BID  . enoxaparin  40 mg Subcutaneous QHS  . gabapentin  100 mg Oral TID  . hydrALAZINE  10 mg Oral Q6H  . insulin aspart  0-9 Units Subcutaneous TID WC   . insulin detemir  6 Units Subcutaneous QHS  . metoprolol tartrate  25 mg Oral BID  . mulitivitamin with minerals  1 tablet Oral Daily  . nicotine  21 mg Transdermal Daily  . pantoprazole  40 mg Oral Q1200  . potassium chloride  40 mEq Oral QID  . sodium chloride  3 mL Intravenous Q12H  . DISCONTD: aliskiren  150 mg Oral Daily   Continuous Infusions:    Assessment/Plan: 1. Accelerated/malignant Hypertension: Remains poorly controlled. Increase Aliskiren. Add hydralazine. Continue clonidine and beta blocker therapy. Did not receive a return phone call from cardiologist yesterday. She will need close outpatient followup with him next week given her history of renal artery stenosis. Fortunately the patient remains asymptomatic. 2. Status post Altered  mental status: Episode of unresponsiveness thought to be secondary to hypoglycemia versus hypertensive encephalopathy or both, resolved. CAT scan of the brain, 2-D echocardiogram and carotid duplex was obtained. Unremarkable. 3. Diabetes mellitus type 2/peripheral neuropathy, controlled: Hypoglycemic on admission. Recurrent episode this morning. Decrease Levemir. Hemoglobin A1c 6.7. 4. Hypokalemia: Resolved. Magnesium within normal limits. Aldosterone level can be followed-up in the outpatient setting. 5. Questionable Aspiration pneumonitis: Discontinue Augmentin. No clinical evidence of this. 6. Peripheral vascular disease: Stable. 7. Renal artery stenosis: Followup with Dr. Jacinto Halim. 8. Cigarette smoker: Recommend cessation.   Main issue at this point his marked hypertension. As this possibly contributed to her presentation and admission will keep hospitalized for now to further adjust.    Disposition Plan: Home when improved.   Brendia Sacks, MD  [facility] Pager [phone] 04/02/2011, 6:05 PM    LOS: 5 days

## 2011-04-02 NOTE — Progress Notes (Signed)
Inpatient Diabetes Program Recommendations  AACE/ADA: New Consensus Statement on Inpatient Glycemic Control (2009)  Target Ranges:  Prepandial:   less than 140 mg/dL      Peak postprandial:   less than 180 mg/dL (1-2 hours)      Critically ill patients:  140 - 180 mg/dL   Reason for Visit: Hypoglycemia  Results for Carmen [name], Carmen [name] (MRN [id number]) as of 04/02/2011 16:14  Ref. Range 04/01/2011 07:48 04/01/2011 08:44 04/01/2011 11:35 04/01/2011 16:46 04/01/2011 21:37 04/02/2011 04:51 04/02/2011 07:54 04/02/2011 08:48 04/02/2011 11:53  Glucose-Capillary Latest Range: 70-99 mg/dL 60 (L) 409 (H) 811 (H) 118 (H) 258 (H)  59 (L) 77 133 (H)  Glucose Latest Range: 70-99 mg/dL      914 (H)       Inpatient Diabetes Program Recommendations Insulin - Basal: May need to decrease Lantus to 4 units QHS

## 2011-04-03 LAB — GLUCOSE, CAPILLARY
Glucose-Capillary: 120 mg/dL — ABNORMAL HIGH (ref 70–99)
Glucose-Capillary: 50 mg/dL — ABNORMAL LOW (ref 70–99)

## 2011-04-03 LAB — MAGNESIUM: Magnesium: 1.8 mg/dL (ref 1.5–2.5)

## 2011-04-03 MED ORDER — FUROSEMIDE 40 MG PO TABS
40.0000 mg | ORAL_TABLET | Freq: Every day | ORAL | Status: DC
  Administered 2011-04-03 – 2011-04-06 (×4): 40 mg via ORAL
  Filled 2011-04-03 (×4): qty 1

## 2011-04-03 MED ORDER — AMLODIPINE BESYLATE 5 MG PO TABS
5.0000 mg | ORAL_TABLET | Freq: Every day | ORAL | Status: DC
  Filled 2011-04-03 (×2): qty 1

## 2011-04-03 NOTE — Progress Notes (Signed)
PROGRESS NOTE  Carmen [name] NWG:[id number] DOB: 1933-05-02 DOA: 03/28/2011 PCP: Gwynneth Aliment, MD, MD Cardiologist: Dr. Jacinto Halim  Brief narrative: 78 year old woman who presented via EMS to the emergency department with a history of being found unresponsive. Put sugar noted to be less than 40. Blood pressure was noted be elevated (she had not taken her antihypertensive medication that day). The emergency department blood pressure was noted to be 249/100. Potassium was noted to be 2.  Past medical history: Renal artery stenosis, status post stent placement 2 years ago;  Consultants:  Physical therapy: No followup recommended. No equipment recommended.  Procedures:  January 21: Carotid duplex: No evidence of hemodynamically significant internal carotid artery stenosis. Vertebral artery flow antegrade.  January 21: Bilateral lower extremity ABI: Right ABI 0.62, moderate reduction. Left ABI 0.98, within normal range.  January 21: 2-D echocardiogram: Left ventricle: The cavity size was normal. There was moderate concentric hypertrophy. Systolic function was normal. The estimated ejection fraction was in the range of 60% to 65%. Wall motion was normal; there were no regional wall motion abnormalities.  Antibiotics:  January 21: Augmentin  Interim History: Documentation reviewed. Continues to be hypertensive. Hypoglycemic episode this morning. Subjective: Feels okay. No complaints.  Objective: Filed Vitals:   04/03/11 0618 04/03/11 5784 04/03/11 0658 04/03/11 0659  BP: 232/99 196/77 206/72 206/80  Pulse:  60    Temp:  98.3 F (36.8 C)    TempSrc:  Oral    Resp:  20    Height:      Weight:      SpO2:  93%      Intake/Output Summary (Last 24 hours) at 04/03/11 1444 Last data filed at 04/03/11 0900  Gross per 24 hour  Intake   1065 ml  Output      0 ml  Net   1065 ml    Exam:  General: Appears calm and comfortable. Cardiovascular: Regular rate and rhythm. No murmur, rub  or gallop. No lower extremity edema. Respiratory: Clear to auscultation bilaterally. No wheezes, rales or rhonchi. Normal respiratory effort.  Data Reviewed: Basic Metabolic Panel:  Lab 04/03/11 6962 04/02/11 0451 04/01/11 0435 03/31/11 0517 03/30/11 1340 03/30/11 0450 03/28/11 2345  NA -- 139 137 137 136 142 --  K -- 3.9 3.3* -- -- -- --  CL -- 109 104 104 101 104 --  CO2 -- 25 27 28 29  32 --  GLUCOSE -- 122* 118* 97 292* 111* --  BUN -- 11 12 11 8 8  --  CREATININE -- 0.98 0.91 0.87 0.94 0.86 --  CALCIUM -- 8.6 8.8 8.4 8.2* 8.4 --  MG 1.8 -- -- 1.7 -- -- 1.6  PHOS -- -- -- -- -- -- 3.1   CBC:  Lab 03/30/11 0450 03/29/11 0715 03/28/11 0955  WBC 3.9* 4.1 4.3  NEUTROABS -- -- 3.2  HGB 13.5 11.9* 14.9  HCT 40.1 34.2* 43.1  MCV 96.9 95.8 95.4  PLT 176 175 204   Cardiac Enzymes:  Lab 03/29/11 1500 03/29/11 0715 03/28/11 2345  CKTOTAL 157 147 186*  CKMB 2.7 3.0 3.4  CKMBINDEX -- -- --  TROPONINI <0.30 <0.30 <0.30   CBG:  Lab 04/03/11 1148 04/03/11 0820 04/03/11 0758 04/02/11 2145 04/02/11 2116  GLUCAP 144* 120* 50* 133* 59*    Recent Results (from the past 240 hour(s))  URINE CULTURE     Status: Normal   Collection Time   03/28/11  9:42 AM      Component Value Range Status  Comment   Specimen Description URINE, CLEAN CATCH   Final    Special Requests NONE   Final    Setup Time [id number]   Final    Colony Count 65,000 COLONIES/ML   Final    Culture     Final    Value: Multiple bacterial morphotypes present, none predominant. Suggest appropriate recollection if clinically indicated.   Report Status 03/29/2011 FINAL   Final   MRSA PCR SCREENING     Status: Normal   Collection Time   03/29/11 12:04 AM      Component Value Range Status Comment   MRSA by PCR NEGATIVE  NEGATIVE  Final      Studies: Ct Head Wo Contrast  03/28/2011  *RADIOLOGY REPORT*  Clinical Data: Altered mental status.  Headache.  CT HEAD WITHOUT CONTRAST  Technique:  Contiguous axial images were  obtained from the base of the skull through the vertex without contrast.  Comparison: Head CT scan 04/11/2009.  Findings: The brain is atrophic with chronic microvascular ischemic change.  Remote lacunar infarction left basal ganglia noted.  No evidence of acute abnormality including infarction, hemorrhage, mass lesion, mass effect, midline shift or abnormal extra-axial fluid collection.  Are small amount of mastoid fluid on the right noted.  IMPRESSION: No acute finding.  Original Report Authenticated By: Bernadene Bell. Maricela Curet, M.D.   Portable Chest 1 View  03/29/2011  *RADIOLOGY REPORT*  Clinical Data: Altered mental status.  PORTABLE CHEST - 1 VIEW  Comparison: Chest radiograph performed 04/13/2009  Findings: Mild right basilar airspace opacity likely reflects atelectasis.  A calcified granuloma is again noted at the left midlung zone.  Vascular congestion is seen; mildly increased interstitial markings are noted.  This could conceivably reflect minimal interstitial edema.  No pleural effusion or pneumothorax is identified.  The cardiomediastinal silhouette is mildly enlarged.  Calcification is noted within the aortic arch.  A vascular stent is noted overlying the right lung apex.  No acute osseous abnormalities are seen.  IMPRESSION:  1.  Mild right basilar airspace opacity likely reflects atelectasis. 2.  Vascular congestion and mild cardiomegaly noted; mildly increased interstitial markings could conceivably reflect minimal interstitial edema.  Original Report Authenticated By: Tonia Ghent, M.D.    Scheduled Meds:    . aliskiren  300 mg Oral Daily  . aspirin  325 mg Oral Daily  . cilostazol  100 mg Oral BID  . cloNIDine  0.3 mg Oral TID  . docusate sodium  100 mg Oral BID  . enoxaparin  40 mg Subcutaneous QHS  . gabapentin  100 mg Oral TID  . hydrALAZINE  10 mg Oral Q6H  . insulin aspart  0-9 Units Subcutaneous TID WC  . insulin detemir  4 Units Subcutaneous QHS  . metoprolol tartrate  25 mg  Oral BID  . mulitivitamin with minerals  1 tablet Oral Daily  . nicotine  21 mg Transdermal Daily  . pantoprazole  40 mg Oral Q1200  . potassium chloride  40 mEq Oral Daily  . sodium chloride  3 mL Intravenous Q12H  . DISCONTD: amoxicillin-clavulanate  1 tablet Oral Q12H  . DISCONTD: insulin detemir  6 Units Subcutaneous QHS  . DISCONTD: potassium chloride  40 mEq Oral QID   Continuous Infusions:    Assessment/Plan: 1. Accelerated/malignant Hypertension: Remains poorly controlled but asymptomatic. Increased Aliskiren but I am somewhat doubtful of the utility of this in this patient. Continue hydralazine. Increase hydralazine to 25 mg by mouth 4 times a day tomorrow.  Continue clonidine and beta blocker therapy. Add Lasix. She will need close outpatient followup with her cardiologist next week given her history of renal artery stenosis.  2. Status post Altered mental status: Episode of unresponsiveness thought to be secondary to hypoglycemia versus hypertensive encephalopathy or both, resolved. CAT scan of the brain, 2-D echocardiogram and carotid duplex was obtained. Unremarkable. 3. Diabetes mellitus type 2/hypoglycemia: Hypoglycemic on admission and several recurrent episodes despite decreased dose of long-acting insulin. Discontinue Levemir. Continue sliding-scale insulin. Hemoglobin A1c 6.7. Consider are controlled on discharge. 4. Hypokalemia: Resolved. Magnesium within normal limits.  5. Questionable Aspiration pneumonitis: Discontinued Augmentin. No clinical evidence of this. 6. Peripheral vascular disease: Stable. 7. Renal artery stenosis: Followup with Dr. Jacinto Halim. 8. Cigarette smoker: Recommend cessation.  Main issue at this point his marked hypertension. I am somewhat suspicious that his resistant hypertension will only be effectively dealt with when she receives further evaluation for possible recurrent renal artery stenosis. As this possibly contributed to her presentation and  admission will keep hospitalized for now to further adjust.    Disposition Plan: Home when improved.   Brendia Sacks, MD  [facility] Pager [phone] 04/03/2011, 2:44 PM    LOS: 6 days

## 2011-04-03 NOTE — Plan of Care (Signed)
Problem: Phase I Progression Outcomes Goal: Other Phase I Outcomes/Goals Outcome: Not Applicable Date Met:  04/03/11 Pt w/ CBG 50 this am; asymptomatic; orange juice x2 given-repeat CBG-120.; breakfast consumed-100%.AW

## 2011-04-04 LAB — BASIC METABOLIC PANEL
BUN: 13 mg/dL (ref 6–23)
Creatinine, Ser: 1.02 mg/dL (ref 0.50–1.10)
GFR calc Af Amer: 61 mL/min — ABNORMAL LOW (ref >90)
GFR calc non Af Amer: 52 mL/min — ABNORMAL LOW (ref >90)
Glucose, Bld: 171 mg/dL — ABNORMAL HIGH (ref 70–99)

## 2011-04-04 LAB — GLUCOSE, CAPILLARY
Glucose-Capillary: 219 mg/dL — ABNORMAL HIGH (ref 70–99)
Glucose-Capillary: 145 mg/dL — ABNORMAL HIGH (ref 70–99)

## 2011-04-04 MED ORDER — ISOSORBIDE MONONITRATE 15 MG HALF TABLET
15.0000 mg | ORAL_TABLET | Freq: Every day | ORAL | Status: DC
  Administered 2011-04-04 – 2011-04-05 (×2): 15 mg via ORAL
  Filled 2011-04-04 (×2): qty 1

## 2011-04-04 NOTE — Progress Notes (Signed)
PROGRESS NOTE  Carmen [name]:[id number] DOB: 06/13/1933 DOA: 03/28/2011 PCP: Gwynneth Aliment, MD, MD Cardiologist: Dr. Jacinto Halim  Brief narrative: 78 year old woman who presented via EMS to the emergency department with a history of being found unresponsive. Put sugar noted to be less than 40. Blood pressure was noted be elevated (she had not taken her antihypertensive medication that day). The emergency department blood pressure was noted to be 249/100. Potassium was noted to be 2.  Past medical history: Renal artery stenosis, status post stent placement 2 years ago;  Consultants:  Physical therapy: No followup recommended. No equipment recommended.  Procedures:  January 21: Carotid duplex: No evidence of hemodynamically significant internal carotid artery stenosis. Vertebral artery flow antegrade.  January 21: Bilateral lower extremity ABI: Right ABI 0.62, moderate reduction. Left ABI 0.98, within normal range.  January 21: 2-D echocardiogram: Left ventricle: The cavity size was normal. There was moderate concentric hypertrophy. Systolic function was normal. The estimated ejection fraction was in the range of 60% to 65%. Wall motion was normal; there were no regional wall motion abnormalities.  Antibiotics:  January 21: Augmentin  Interim History: Documentation reviewed. Continues to be hypertensive ,but better controlled Subjective: Feels okay.  Concerned about her blood sugars.  No further concerns.  NO CP or SOB.  NO N/V/no current blurred vision-to get cataract surgery done as an outpatient soon  Objective: Filed Vitals:   04/04/11 0624 04/04/11 0955 04/04/11 1045 04/04/11 1353  BP: 183/78 214/88 222/89 166/64  Pulse: 64 65 60 62  Temp: 98.2 F (36.8 C) 98.6 F (37 C)  97.9 F (36.6 C)  TempSrc: Oral Oral  Oral  Resp:  18  18  Height:      Weight:      SpO2:  93%  95%    Intake/Output Summary (Last 24 hours) at 04/04/11 1432 Last data filed at 04/04/11 1300  Gross per 24 hour  Intake   1440 ml  Output      1 ml  Net   1439 ml    Exam:  General: Appears calm and comfortable. Cardiovascular: Regular rate and rhythm. No murmur, rub or gallop. No lower extremity edema. Respiratory: Clear to auscultation bilaterally. No wheezes, rales or rhonchi. Normal respiratory effort.  Data Reviewed: Basic Metabolic Panel:  Lab 04/04/11 8119 04/03/11 0715 04/02/11 0451 04/01/11 0435 03/31/11 0517 03/30/11 1340 03/28/11 2345  NA 135 -- 139 137 137 136 --  K 4.3 -- 3.9 -- -- -- --  CL 105 -- 109 104 104 101 --  CO2 25 -- 25 27 28 29  --  GLUCOSE 171* -- 122* 118* 97 292* --  BUN 13 -- 11 12 11 8  --  CREATININE 1.02 -- 0.98 0.91 0.87 0.94 --  CALCIUM 8.8 -- 8.6 8.8 8.4 8.2* --  MG -- 1.8 -- -- 1.7 -- 1.6  PHOS -- -- -- -- -- -- 3.1   CBC:  Lab 03/30/11 0450 03/29/11 0715  WBC 3.9* 4.1  NEUTROABS -- --  HGB 13.5 11.9*  HCT 40.1 34.2*  MCV 96.9 95.8  PLT 176 175   Cardiac Enzymes:  Lab 03/29/11 1500 03/29/11 0715 03/28/11 2345  CKTOTAL 157 147 186*  CKMB 2.7 3.0 3.4  CKMBINDEX -- -- --  TROPONINI <0.30 <0.30 <0.30   CBG:  Lab 04/04/11 1143 04/04/11 0809 04/04/11 0400 04/03/11 2116 04/03/11 1717  GLUCAP 158* 132* 183* 219* 191*    Recent Results (from the past 240 hour(s))  URINE CULTURE  Status: Normal   Collection Time   03/28/11  9:42 AM      Component Value Range Status Comment   Specimen Description URINE, CLEAN CATCH   Final    Special Requests NONE   Final    Setup Time [id number]   Final    Colony Count 65,000 COLONIES/ML   Final    Culture     Final    Value: Multiple bacterial morphotypes present, none predominant. Suggest appropriate recollection if clinically indicated.   Report Status 03/29/2011 FINAL   Final   MRSA PCR SCREENING     Status: Normal   Collection Time   03/29/11 12:04 AM      Component Value Range Status Comment   MRSA by PCR NEGATIVE  NEGATIVE  Final      Studies: Ct Head Wo  Contrast  03/28/2011  *RADIOLOGY REPORT*  Clinical Data: Altered mental status.  Headache.  CT HEAD WITHOUT CONTRAST  Technique:  Contiguous axial images were obtained from the base of the skull through the vertex without contrast.  Comparison: Head CT scan 04/11/2009.  Findings: The brain is atrophic with chronic microvascular ischemic change.  Remote lacunar infarction left basal ganglia noted.  No evidence of acute abnormality including infarction, hemorrhage, mass lesion, mass effect, midline shift or abnormal extra-axial fluid collection.  Are small amount of mastoid fluid on the right noted.  IMPRESSION: No acute finding.  Original Report Authenticated By: Bernadene Bell. Maricela Curet, M.D.   Portable Chest 1 View  03/29/2011  *RADIOLOGY REPORT*  Clinical Data: Altered mental status.  PORTABLE CHEST - 1 VIEW  Comparison: Chest radiograph performed 04/13/2009  Findings: Mild right basilar airspace opacity likely reflects atelectasis.  A calcified granuloma is again noted at the left midlung zone.  Vascular congestion is seen; mildly increased interstitial markings are noted.  This could conceivably reflect minimal interstitial edema.  No pleural effusion or pneumothorax is identified.  The cardiomediastinal silhouette is mildly enlarged.  Calcification is noted within the aortic arch.  A vascular stent is noted overlying the right lung apex.  No acute osseous abnormalities are seen.  IMPRESSION:  1.  Mild right basilar airspace opacity likely reflects atelectasis. 2.  Vascular congestion and mild cardiomegaly noted; mildly increased interstitial markings could conceivably reflect minimal interstitial edema.  Original Report Authenticated By: Tonia Ghent, M.D.    Scheduled Meds:    . aliskiren  300 mg Oral Daily  . amLODipine  5 mg Oral Daily  . aspirin  325 mg Oral Daily  . cilostazol  100 mg Oral BID  . cloNIDine  0.3 mg Oral TID  . docusate sodium  100 mg Oral BID  . enoxaparin  40 mg Subcutaneous  QHS  . furosemide  40 mg Oral Daily  . gabapentin  100 mg Oral TID  . hydrALAZINE  10 mg Oral Q6H  . insulin aspart  0-9 Units Subcutaneous TID WC  . metoprolol tartrate  25 mg Oral BID  . mulitivitamin with minerals  1 tablet Oral Daily  . nicotine  21 mg Transdermal Daily  . pantoprazole  40 mg Oral Q1200  . potassium chloride  40 mEq Oral Daily  . sodium chloride  3 mL Intravenous Q12H  . DISCONTD: insulin detemir  4 Units Subcutaneous QHS   Continuous Infusions:    Assessment/Plan: 1. Accelerated/malignant Hypertension: Remains poorly controlled but asymptomatic. Increased Aliskiren but I am somewhat doubtful of the utility of this in this patient. Continue hydralazine. Increase hydralazine to  25 mg by mouth 4 times a day tomorrow. Continue clonidine and beta blocker therapy. Add Lasix. Allergic to Norvasc (rash, lower extremity swelling)-started in your 15 mg today. ARB can also be relatively contraindicated as an ACE inhibitor with a history of renal stenosis 2. Status post Altered mental status: Episode of unresponsiveness thought to be secondary to hypoglycemia versus hypertensive encephalopathy or both, resolved. CAT scan of the brain, 2-D echocardiogram and carotid duplex was obtained. Unremarkable. 3. Diabetes mellitus type 2/hypoglycemia: Hypoglycemic on admission and several recurrent episodes despite decreased dose of long-acting insulin. Discontinue Levemir. Continue sliding-scale insulin. Hemoglobin A1c 6.7. Would not discharge on long-term insulin. 4. Hypokalemia: Resolved. Magnesium within normal limits.  5. Questionable Aspiration pneumonitis: Discontinued Augmentin. No clinical evidence of this. 6. Peripheral vascular disease: Stable. 7. Renal artery stenosis: Followup with Dr. Hewitt Shorts need possible stent revision and reassessment as an outpatient 8. Cigarette smoker: Recommend cessation.  Main issue at this point his marked hypertension. I am somewhat suspicious that  his resistant hypertension will only be effectively dealt with when she receives further evaluation for possible recurrent renal artery stenosis. As this possibly contributed to her presentation and admission will keep hospitalized for now to further adjust.    Disposition Plan: Home when improved.  I cleaned 48 hours   Brendia Sacks, MD  [facility] Pager [phone] 04/04/2011, 2:32 PM    LOS: 7 days

## 2011-04-04 NOTE — Progress Notes (Signed)
bp is 203/77 - prn apresoline given and pt states she did not take her Norvasc today and can't ever take it as she is allergic to it.  telem remains SR rate in 60 and 70's. Will continue to monitor

## 2011-04-04 NOTE — Progress Notes (Deleted)
bp is 203/77, gave prn hydralazine, remains SR  On the monitor rate is in the 60s to 70's.

## 2011-04-04 NOTE — Progress Notes (Signed)
bp now is 153/69

## 2011-04-04 NOTE — Progress Notes (Signed)
bp this am at o400  202/73 - gave prn iv hydralazine and rechecked at 0600 and bp was 182/78  And hr 66 gave schedules po hydralazine - pt s c/o - will continue to monitor

## 2011-04-04 NOTE — Progress Notes (Deleted)
bp is now 153/69, remains SR on monitor in the 60's, - will continue to monitor

## 2011-04-04 NOTE — Progress Notes (Deleted)
bp is 203/77  Gave prn hydralazine rechecked 0001 and bp is down to 153/69 - continues to be SR in the 6- -70's, will continue to monitor.

## 2011-04-05 ENCOUNTER — Encounter (HOSPITAL_COMMUNITY): Payer: Self-pay | Admitting: Cardiology

## 2011-04-05 DIAGNOSIS — I739 Peripheral vascular disease, unspecified: Secondary | ICD-10-CM | POA: Insufficient documentation

## 2011-04-05 DIAGNOSIS — I150 Renovascular hypertension: Secondary | ICD-10-CM

## 2011-04-05 LAB — BASIC METABOLIC PANEL
BUN: 14 mg/dL (ref 6–23)
Chloride: 105 mEq/L (ref 96–112)
GFR calc Af Amer: 62 mL/min — ABNORMAL LOW (ref >90)
GFR calc non Af Amer: 54 mL/min — ABNORMAL LOW (ref >90)
Glucose, Bld: 169 mg/dL — ABNORMAL HIGH (ref 70–99)
Potassium: 3.6 mEq/L (ref 3.5–5.1)
Sodium: 137 mEq/L (ref 135–145)

## 2011-04-05 LAB — GLUCOSE, CAPILLARY
Glucose-Capillary: 217 mg/dL — ABNORMAL HIGH (ref 70–99)
Glucose-Capillary: 138 mg/dL — ABNORMAL HIGH (ref 70–99)
Glucose-Capillary: 165 mg/dL — ABNORMAL HIGH (ref 70–99)
Glucose-Capillary: 169 mg/dL — ABNORMAL HIGH (ref 70–99)
Glucose-Capillary: 175 mg/dL — ABNORMAL HIGH (ref 70–99)

## 2011-04-05 MED ORDER — LOSARTAN POTASSIUM 25 MG PO TABS
25.0000 mg | ORAL_TABLET | Freq: Every day | ORAL | Status: DC
  Administered 2011-04-05 – 2011-04-06 (×2): 25 mg via ORAL
  Filled 2011-04-05 (×2): qty 1

## 2011-04-05 MED ORDER — ISOSORBIDE MONONITRATE ER 30 MG PO TB24
30.0000 mg | ORAL_TABLET | Freq: Every day | ORAL | Status: DC
  Administered 2011-04-06: 30 mg via ORAL
  Filled 2011-04-05: qty 1

## 2011-04-05 NOTE — Consult Note (Addendum)
CARDIOLOGY CONSULT NOTE  Patient ID: Carmen [name] MRN: [id number] DOB/AGE: Jun 30, 1933 78 y.o.  Admit date: 03/28/2011 Referring Physician Carmen Koch, MD Primary Physician: Carmen Aliment, MD, MD Reason for Consultation Hypertension  HPI: Patient is 78 years of age and is well known to me. She has chronic uncontrolled hypertension. She has unilateral renal artery stenosis and has had instent restenosis. In spite of renal artery stenosis which is unilateral she has had difficulty in controlling hypertension every time there is restenosis. She is now admitted with altered mental status secondary to severe hypoglycemia. Patient states that she.eating well but took the usual dose of insulin and later on patient was found unresponsive and was made to admit her to [facility] for further evaluation. I have been asked to opine regarding her hypertension controlled. Patient is presently doing well and wants to go home. She is essentially asymptomatic. As per my my charts 6 months ago she was on an Ace inhibitor and also beta blocker for blood pressure control. She's now on minimal doses of medications. I do not know the reason why her blood pressure medications were changed although she states her amlodipine was stopped due to lower extremity edema.    Past Medical History  Diagnosis Date  . Diabetes mellitus   . Hypertension   . Coronary artery disease   . Renal disorder   . Herniated lumbar intervertebral disc   . Cataract     cataract surgery scheduled for 03/31/11, 2 - left eye, 1 - right eye  . Renovascular hypertension      s/p left renal artery stent 12/2007.   S/P balloon angioplasty on 02/16/10 for ISR, BP was  controlled well since then.     . Claudication in peripheral vascular disease     02/16/10: Left CIA 9.0x28 Omnilink and REIA 8.0x40 seff expanding Zilver.   right subclavian artery stent 03/18/2008,  . S/P carotid endarterectomy      left carotid endarterectomy  1991;  Stroke and TIA in 1999 with right sided weakness, now with residual right arm weakness     Past Surgical History  Procedure Date  . Appendectomy   . Abdominal hysterectomy   . Ureteral stent placement   . Carotid endarterectomy   . Laminectomy      History reviewed. No pertinent family history.  Social History: History   Social History  . Marital Status: Married    Spouse Name: N/A    Number of Children: N/A  . Years of Education: N/A   Occupational History  . Not on file.   Social History Main Topics  . Smoking status: Current Everyday Smoker -- 0.5 packs/day  . Smokeless tobacco: Never Used  . Alcohol Use: No  . Drug Use:   . Sexually Active:    Other Topics Concern  . Not on file   Social History Narrative  . No narrative on file     Prescriptions prior to admission  Medication Sig Dispense Refill  . aliskiren (TEKTURNA) 150 MG tablet Take 150 mg by mouth daily.      Marland Kitchen ALPRAZolam (XANAX) 0.5 MG tablet Take 0.5 mg by mouth at bedtime as needed.      Marland Kitchen aspirin 325 MG tablet Take 325 mg by mouth daily.      . cilostazol (PLETAL) 100 MG tablet Take 100 mg by mouth 2 (two) times daily.      . cloNIDine (CATAPRES) 0.2 MG tablet Take 0.2 mg by mouth 2 (two)  times daily.      . insulin detemir (LEVEMIR) 100 UNIT/ML injection Inject 10-20 Units into the skin at bedtime. 20 units in the morning and 10 units at bedtime        ROS: General: no fevers/chills/night sweats Eyes: no blurry vision, diplopia, or amaurosis ENT: no sore throat or hearing loss Resp: no cough, wheezing, or hemoptysis CV: no edema or palpitations GI: no abdominal pain, nausea, vomiting, diarrhea, or constipation GU: no dysuria, frequency, or hematuria Skin: no rash Neuro: no headache, numbness, tingling, or weakness of extremities Musculoskeletal: no joint pain or swelling Heme: no bleeding, DVT, or easy bruising Endo: no polydipsia or polyuria    Physical Exam: Blood pressure 155/64,  pulse 60, temperature 97.9 F (36.6 C), temperature source Oral, resp. rate 15, height 5\' 4"  (1.626 m), weight 65.182 kg (143 lb 11.2 oz), SpO2 96.00%.  Pt is alert and oriented, WD, WN, in no distress. Moderately built and normal bodily habitus, in no acute distress.   Appears stated age.   There is no cyanosis.   HEENT: normal limits.   NECK: no JVD noted.   CARDIAC EXAM: S1 S2 normal, no gallop, II/VI SEM noted in apex and right sternal border.   CHEST EXAM: No tenderness of chest wall.   LUNGS: Clear to percuss and auscultate.   No rales or ronchi.   ABDOMEN: No hepatosplenomegaly.   BS normal in all 4 quadrants.   Abdomen is non-tender.  MUSCULOSKELETAL EXAM: Intact with full range of motion in all 3 extremities, with mild limitation in the right arm movement due to contracture old.   EXTREMITY: Warm, non tender Pitting edema bilateral below knee,  ankle 1-2 plus.  Non tender. VASCULAR EXAM: No skin breakdown. Carotids bilateral soft bruit present. Extremities: Femoral pulse 1-2 plus right, bounding 3 plus left with bilateral soft bruit. Popliteal pulse 2 plus left and absent right ; Pedal pulse absent bilateral.   No prominent pulse felt in the abdomen. Prominent abdominal bruit present. No varicose veins. Labs:   Lab Results  Component Value Date   WBC 3.9* 03/30/2011   HGB 13.5 03/30/2011   HCT 40.1 03/30/2011   MCV 96.9 03/30/2011   PLT 176 03/30/2011    Lab 04/05/11 0435  NA 137  K 3.6  CL 105  CO2 26  BUN 14  CREATININE 1.00  CALCIUM 8.3*  PROT --  BILITOT --  ALKPHOS --  ALT --  AST --  GLUCOSE 169*    Results for Carmen [name] (MRN [id number]) as of 04/05/2011 19:08  Ref. Range 03/28/2011 12:54  Cortisol, Plasma No range found 6.5    Results for Carmen [name] (MRN [id number]) as of 04/05/2011 19:08  Ref. Range 04/11/2009 01:54  Cholesterol Latest Range: 0-200 mg/dL 782       ...  Triglycerides Latest Range: <150 mg/dL 956  HDL Latest Range: >39 mg/dL 59  LDL (calc)  Latest Range: 2-13 mg/dL 19       ...  VLDL Latest Range: 0-40 mg/dL 26  Total CHOL/HDL Ratio No range found 1.8      Results for Carmen [name] (MRN [id number]) as of 04/05/2011 19:08  Ref. Range 03/30/2011 04:50  WBC Latest Range: 4.0-10.5 K/uL 3.9 (L)  RBC Latest Range: 3.87-5.11 MIL/uL 4.14  HGB Latest Range: 12.0-15.0 g/dL 62.9  HCT Latest Range: 36.0-46.0 % 40.1  MCV Latest Range: 78.0-100.0 fL 96.9  MCH Latest Range: 26.0-34.0 pg 32.6  MCHC Latest Range: 30.0-36.0  g/dL 16.1  RDW Latest Range: 11.5-15.5 % 15.6 (H)  Platelets Latest Range: 150-400 K/uL 176    EKG:12013: NSR. LVH with repolarization abnormality.    ASSESSMENT AND PLAN:    1.  Renovascular hypertension, BP uncontrolled s/p left renal artery stent 12/2007.   S/P balloon angioplasty on 02/16/10 for ISR, BP was  controlled well since then.  Patient was on beta blocker,  These (ACEi and beta blockers)  have been stopped in the OP setting by her PCP, and I do not reasons for same. 2. Altered mental status secondary to recurrent hypoglycemia. 3. Peripheral arterial disease disease doing well and stable.  Plan: I would add losartan to the present medications along with beta blocker. She indeed does have chronic hypertension which is uncontrolled. She will eventually need repeat peripheral angiogram especially renal arteriogram which I can set up as an outpatient basis. Patient is presently asymptomatic and doing well. Has can be discharged home from my standpoint although the blood pressure is not controlled. I do not think elevated blood pressure was the etiology for her altered mental status. Please do not hesitate to contact me if you need my further input. I have discussed the options with the patient including repeat peripheral angiogram with an eye towards repeat renal angioplasty. Previously I had obtained fairly adequate results from over dose to about the 10-20 mm mercury pressure gradient across the renal artery  stent. I could not use high-pressure due to the fear of perforation. I may have to repeat stenting and may even consider utilization of a drug-eluting stent.      Marland Kitchen aliskiren  300 mg Oral Daily  . aspirin  325 mg Oral Daily  . cilostazol  100 mg Oral BID  . cloNIDine  0.3 mg Oral TID  . docusate sodium  100 mg Oral BID  . enoxaparin  40 mg Subcutaneous QHS  . furosemide  40 mg Oral Daily  . gabapentin  100 mg Oral TID  . hydrALAZINE  10 mg Oral Q6H  . insulin aspart  0-9 Units Subcutaneous TID WC  . isosorbide mononitrate  30 mg Oral Daily  . metoprolol tartrate  25 mg Oral BID  . mulitivitamin with minerals  1 tablet Oral Daily  . nicotine  21 mg Transdermal Daily  . pantoprazole  40 mg Oral Q1200  . potassium chloride  40 mEq Oral Daily  . sodium chloride  3 mL Intravenous Q12H  . DISCONTD: isosorbide mononitrate  15 mg Oral Daily     04/05/2011, 7:03 PM

## 2011-04-05 NOTE — Progress Notes (Signed)
PROGRESS NOTE  Carmen [name]:[id number] DOB: [birth year] DOA: 03/28/2011 PCP: Gwynneth Aliment, MD, MD Cardiologist: Dr. Jacinto Halim  Brief narrative: 78 year old woman who presented via EMS to the emergency department with a history of being found unresponsive. Put sugar noted to be less than 40. Blood pressure was noted be elevated (she had not taken her antihypertensive medication that day). The emergency department blood pressure was noted to be 249/100. Potassium was noted to be 2.  Past medical history: Renal artery stenosis, status post stent placement 2 years ago  Consultants:  Physical therapy: No followup recommended. No equipment recommended.  Cardiology, Dr. Jacinto Halim 1/29 for consideration of Renal artery angiogram  Procedures:  January 21: Carotid duplex: No evidence of hemodynamically significant internal carotid artery stenosis. Vertebral artery flow antegrade.  January 21: Bilateral lower extremity ABI: Right ABI 0.62, moderate reduction. Left ABI 0.98, within normal range.  January 21: 2-D echocardiogram: Left ventricle: The cavity size was normal. There was moderate concentric hypertrophy. Systolic function was normal. The estimated ejection fraction was in the range of 60% to 65%. Wall motion was normal; there were no regional wall motion abnormalities.  Antibiotics:  January 21: Augmentin  Interim History: Documentation reviewed. Continues to be hypertensive.  Subjective: Feels okay.  Concerned about her blood sugars.  No further concerns.  NO CP or SOB.  NO N/V/no current blurred vision-to get cataract surgery done as an outpatient soon  Objective: Filed Vitals:   04/05/11 0630 04/05/11 1014 04/05/11 1224 04/05/11 1424  BP: 211/77 235/98 181/76 220/84  Pulse: 71 74 58 58  Temp: 98.4 F (36.9 C) 97.9 F (36.6 C)  97.9 F (36.6 C)  TempSrc: Oral     Resp: 19 17  15   Height:      Weight:      SpO2: 94% 96% 96% 96%    Intake/Output Summary (Last 24 hours)  at 04/05/11 1602 Last data filed at 04/05/11 1200  Gross per 24 hour  Intake   1920 ml  Output   3500 ml  Net  -1580 ml    Exam:  General: Appears calm and comfortable. Cardiovascular: Regular rate and rhythm. No murmur, rub or gallop. No lower extremity edema. Respiratory: Clear to auscultation bilaterally. No wheezes, rales or rhonchi. Normal respiratory effort. Abdomen: soft, nt,nd Neuro: oriented and grossly intact  Data Reviewed: Basic Metabolic Panel:  Lab 04/05/11 8119 04/04/11 0601 04/03/11 0715 04/02/11 0451 04/01/11 0435 03/31/11 0517  NA 137 135 -- 139 137 137  K 3.6 4.3 -- -- -- --  CL 105 105 -- 109 104 104  CO2 26 25 -- 25 27 28   GLUCOSE 169* 171* -- 122* 118* 97  BUN 14 13 -- 11 12 11   CREATININE 1.00 1.02 -- 0.98 0.91 0.87  CALCIUM 8.3* 8.8 -- 8.6 8.8 8.4  MG -- -- 1.8 -- -- 1.7  PHOS -- -- -- -- -- --   CBC:  Lab 03/30/11 0450  WBC 3.9*  NEUTROABS --  HGB 13.5  HCT 40.1  MCV 96.9  PLT 176   Cardiac Enzymes: No results found for this basename: CKTOTAL:5,CKMB:5,CKMBINDEX:5,TROPONINI:5 in the last 168 hours CBG:  Lab 04/05/11 1137 04/05/11 0752 04/04/11 2135 04/04/11 1643 04/04/11 1143  GLUCAP 165* 138* 217* 145* 158*    Recent Results (from the past 240 hour(s))  URINE CULTURE     Status: Normal   Collection Time   03/28/11  9:42 AM      Component Value Range Status Comment  Specimen Description URINE, CLEAN CATCH   Final    Special Requests NONE   Final    Culture  Setup Time [id number]   Final    Colony Count 65,000 COLONIES/ML   Final    Culture     Final    Value: Multiple bacterial morphotypes present, none predominant. Suggest appropriate recollection if clinically indicated.   Report Status 03/29/2011 FINAL   Final   MRSA PCR SCREENING     Status: Normal   Collection Time   03/29/11 12:04 AM      Component Value Range Status Comment   MRSA by PCR NEGATIVE  NEGATIVE  Final      Studies: Ct Head Wo Contrast  03/28/2011   *RADIOLOGY REPORT*  Clinical Data: Altered mental status.  Headache.  CT HEAD WITHOUT CONTRAST  Technique:  Contiguous axial images were obtained from the base of the skull through the vertex without contrast.  Comparison: Head CT scan 04/11/2009.  Findings: The brain is atrophic with chronic microvascular ischemic change.  Remote lacunar infarction left basal ganglia noted.  No evidence of acute abnormality including infarction, hemorrhage, mass lesion, mass effect, midline shift or abnormal extra-axial fluid collection.  Are small amount of mastoid fluid on the right noted.  IMPRESSION: No acute finding.  Original Report Authenticated By: Bernadene Bell. Maricela Curet, M.D.   Portable Chest 1 View  03/29/2011  *RADIOLOGY REPORT*  Clinical Data: Altered mental status.  PORTABLE CHEST - 1 VIEW  Comparison: Chest radiograph performed 04/13/2009  Findings: Mild right basilar airspace opacity likely reflects atelectasis.  A calcified granuloma is again noted at the left midlung zone.  Vascular congestion is seen; mildly increased interstitial markings are noted.  This could conceivably reflect minimal interstitial edema.  No pleural effusion or pneumothorax is identified.  The cardiomediastinal silhouette is mildly enlarged.  Calcification is noted within the aortic arch.  A vascular stent is noted overlying the right lung apex.  No acute osseous abnormalities are seen.  IMPRESSION:  1.  Mild right basilar airspace opacity likely reflects atelectasis. 2.  Vascular congestion and mild cardiomegaly noted; mildly increased interstitial markings could conceivably reflect minimal interstitial edema.  Original Report Authenticated By: Tonia Ghent, M.D.    Scheduled Meds:    . aliskiren  300 mg Oral Daily  . aspirin  325 mg Oral Daily  . cilostazol  100 mg Oral BID  . cloNIDine  0.3 mg Oral TID  . docusate sodium  100 mg Oral BID  . enoxaparin  40 mg Subcutaneous QHS  . furosemide  40 mg Oral Daily  . gabapentin  100  mg Oral TID  . hydrALAZINE  10 mg Oral Q6H  . insulin aspart  0-9 Units Subcutaneous TID WC  . isosorbide mononitrate  15 mg Oral Daily  . metoprolol tartrate  25 mg Oral BID  . mulitivitamin with minerals  1 tablet Oral Daily  . nicotine  21 mg Transdermal Daily  . pantoprazole  40 mg Oral Q1200  . potassium chloride  40 mEq Oral Daily  . sodium chloride  3 mL Intravenous Q12H   Continuous Infusions:    Assessment/Plan: 1. Accelerated/malignant Hypertension: Remains poorly controlled but asymptomatic. Increased Aliskiren but I am somewhat doubtful of the utility of this in this patient. Continue hydralazine. Increase hydralazine to 25 mg by mouth 4 times a day tomorrow. Continue clonidine and beta blocker therapy. Add Lasix. Allergic to Norvasc (rash, lower extremity swelling)-Increase Imdur 15->30. 2. Status post Altered mental  status: Episode of unresponsiveness thought to be secondary to hypoglycemia versus hypertensive encephalopathy or both, resolved. CAT scan of the brain, 2-D echocardiogram and carotid duplex was obtained. Unremarkable. 3. Diabetes mellitus type 2/hypoglycemia: Hypoglycemic on admission and several recurrent episodes despite decreased dose of long-acting insulin. Discontinue Levemir. Continue sliding-scale insulin. Hemoglobin A1c 6.7. Would not discharge on long-term insulin. 4. Hypokalemia: Resolved. Magnesium within normal limits.  5. Questionable Aspiration pneumonitis: Discontinued Augmentin. No clinical evidence of this. 6. Peripheral vascular disease: Stable. 7. Renal artery stenosis: Followup with Dr. Carroll Kinds requested a consult as an in-patient-Appreciate guidance. 8. Cigarette smoker: Recommend cessation.  Main issue at this point his marked hypertension. I am somewhat suspicious that his resistant hypertension will only be effectively dealt with when she receives further evaluation for possible recurrent renal artery stenosis. As this possibly contributed  to her presentation and admission will keep hospitalized for now to further adjust.    Disposition Plan: Home when improved.  Family communication: Spoke with Husband, Fayrene Fearing.  Understands we will review with Dr. Jacinto Halim and formulate a plan.  Pleas Koch, MD  [facility] Pager [phone] 04/05/2011, 4:02 PM    LOS: 8 days

## 2011-04-06 LAB — BASIC METABOLIC PANEL
BUN: 12 mg/dL (ref 6–23)
Chloride: 106 mEq/L (ref 96–112)
Creatinine, Ser: 1.01 mg/dL (ref 0.50–1.10)
GFR calc Af Amer: 62 mL/min — ABNORMAL LOW (ref >90)
GFR calc non Af Amer: 53 mL/min — ABNORMAL LOW (ref >90)
Potassium: 3.8 mEq/L (ref 3.5–5.1)

## 2011-04-06 LAB — GLUCOSE, CAPILLARY

## 2011-04-06 MED ORDER — CLONIDINE HCL 0.2 MG PO TABS: 0.2000 mg | ORAL_TABLET | Freq: Two times a day (BID) | ORAL | Status: DC

## 2011-04-06 MED ORDER — GABAPENTIN 100 MG PO CAPS: 100.0000 mg | ORAL_CAPSULE | Freq: Three times a day (TID) | ORAL | Status: DC

## 2011-04-06 MED ORDER — HYDRALAZINE HCL 10 MG PO TABS: 20.0000 mg | ORAL_TABLET | Freq: Four times a day (QID) | ORAL | Status: DC

## 2011-04-06 MED ORDER — METOPROLOL TARTRATE 25 MG PO TABS: 25.0000 mg | ORAL_TABLET | Freq: Two times a day (BID) | ORAL | Status: DC

## 2011-04-06 MED ORDER — ISOSORBIDE MONONITRATE ER 30 MG PO TB24: 30.0000 mg | ORAL_TABLET | Freq: Every day | ORAL | Status: DC

## 2011-04-06 MED ORDER — LOSARTAN POTASSIUM 25 MG PO TABS: 25.0000 mg | ORAL_TABLET | Freq: Every day | ORAL | Status: DC

## 2011-04-06 MED ORDER — FUROSEMIDE 40 MG PO TABS: 40.0000 mg | ORAL_TABLET | Freq: Every day | ORAL | Status: DC

## 2011-04-06 NOTE — Discharge Summary (Signed)
Physician Discharge Summary  Patient ID: Carmen [name] MRN: [id number] DOB/AGE: Feb 14, 1934 78 y.o.  Admit date: 03/28/2011 Discharge date: 04/06/2011  Admission Diagnoses: Carmen [name] AOZ:[id number] DOB: 04-24-1933 DOA: 03/28/2011 PCP: Gwynneth Aliment, MD, MD Cardiologist: Dr. Jacinto Halim  Brief narrative: 78 year old woman who presented via EMS to the emergency department with a history of being found unresponsive. Put sugar noted to be less than 40. Blood pressure was noted be elevated (she had not taken her antihypertensive medication that day). The emergency department blood pressure was noted to be 249/100. Potassium was noted to be 2.0  Past medical history: Renal artery stenosis, status post stent placement 2 years ago  Consultants:  Physical therapy: No followup recommended. No equipment recommended.   Cardiology, Dr. Jacinto Halim 1/29 for consideration of Renal artery angiogram  Procedures:   January 21: Carotid duplex: No evidence of hemodynamically significant internal carotid artery stenosis. Vertebral artery flow antegrade.   January 21: Bilateral lower extremity ABI: Right ABI 0.62, moderate reduction. Left ABI 0.98, within normal range.   January 21: 2-D echocardiogram: Left ventricle: The cavity size was normal. There was moderate concentric hypertrophy. Systolic function was normal. The estimated ejection fraction was in the range of 60% to 65%. Wall motion was normal; there were no regional wall motion abnormalities.  Initial workup showed low blood glucose of 106 with sodium 145 potassium 2.0 BUN 7 creatinine 0.72  Point of care cardiac enzymes were negative and  urine culture 65000 colony-forming units  CT scan of the brain showed no acute finding  Chest x-ray showed-1. Mild right basilar airspace opacity likely reflects atelectasis. 2. Vascular congestion and mild cardiomegaly noted; mildly increased interstitial markings could conceivably reflect minimal interstitial  edema.  Antibiotics:  January 21: Augmentin  Discharge Diagnoses:  Active Problems:  Altered mental state  Hypoglycemia  HTN (hypertension), malignant  Hypokalemia  Tobacco abuse  DM (diabetes mellitus), type 2  Peripheral neuropathy  Renovascular hypertension   Discharged Condition: good  Hospital Course:  1. Accelerated/malignant Hypertension: Remains poorly controlled but asymptomatic.  Is allergic to CCB. Pressure medications on discharge would include Imdur 30, hydralazine 20 mg 4 times a day, metoprolol 25 mg twice daily, Cozaar 25 mg daily clonidine 0.3 mg 3 times a day Tekturna 300 mg daily 2. Status post Altered mental status: Episode of unresponsiveness thought to be secondary to hypoglycemia versus hypertensive encephalopathy or both, resolved. CAT scan of the brain, 2-D echocardiogram and carotid duplex was obtained. Unremarkable.  3. Diabetes mellitus type 2/hypoglycemia: Hypoglycemic on admission and several recurrent episodes despite decreased dose of long-acting insulin. Discontinue Levemir. Continue sliding-scale insulin. Hemoglobin A1c 6.7. Would not discharge on long-term insulin, given recent evidence inject her literature of increased mortality with an A1c below 7 4. Hypokalemia: Resolved. Magnesium within normal limits.   5. Questionable Aspiration pneumonitis: Discontinued Augmentin. No clinical evidence of this.  6. Peripheral vascular disease: Stable.  7. Renal artery stenosis: Followup with Dr. Jacinto Halim on 04/06/11 and did clear him for discharge patient is aware that she needs to follow up with him for possible consideration of repeat renal angiogram and intervention as this might be the cause for her secondary/accelerated malignant hypertension. 8. Cigarette smoker: Recommend cessation. Patient allergic to patch.  Consults: cardiology   Discharge Exam: Blood pressure 185/60, pulse 67, temperature 98.8 F (37.1 C), temperature source Oral, resp. rate 18,  height 5\' 4"  (1.626 m), weight 65.182 kg (143 lb 11.2 oz), SpO2 95.00%. General: Appears calm and comfortable. Cardiovascular: Regular  rate and rhythm. No murmur, rub or gallop. No lower extremity edema. Respiratory: Clear to auscultation bilaterally. No wheezes, rales or rhonchi. Normal respiratory effort. Abdomen: soft, nt,nd Neuro: oriented and grossly intact  Disposition: Home or Self Care   Medication List  As of 04/06/2011 12:43 PM   STOP taking these medications         insulin detemir 100 UNIT/ML injection         TAKE these medications         aliskiren 150 MG tablet   Commonly known as: TEKTURNA   Take 150 mg by mouth daily.      ALPRAZolam 0.5 MG tablet   Commonly known as: XANAX   Take 0.5 mg by mouth at bedtime as needed.      aspirin 325 MG tablet   Take 325 mg by mouth daily.      cilostazol 100 MG tablet   Commonly known as: PLETAL   Take 100 mg by mouth 2 (two) times daily.      cloNIDine 0.2 MG tablet   Commonly known as: CATAPRES   Take 0.2 mg by mouth 2 (two) times daily.      furosemide 40 MG tablet   Commonly known as: LASIX   Take 1 tablet (40 mg total) by mouth daily.      gabapentin 100 MG capsule   Commonly known as: NEURONTIN   Take 1 capsule (100 mg total) by mouth 3 (three) times daily.      hydrALAZINE 10 MG tablet   Commonly known as: APRESOLINE   Take 2 tablets (20 mg total) by mouth 4 (four) times daily.      isosorbide mononitrate 30 MG 24 hr tablet   Commonly known as: IMDUR   Take 1 tablet (30 mg total) by mouth daily.      losartan 25 MG tablet   Commonly known as: COZAAR   Take 1 tablet (25 mg total) by mouth daily.      metoprolol tartrate 25 MG tablet   Commonly known as: LOPRESSOR   Take 1 tablet (25 mg total) by mouth 2 (two) times daily.             SignedPleas Koch 04/06/2011, 12:28 PM

## 2011-04-26 ENCOUNTER — Encounter (HOSPITAL_COMMUNITY): Admission: RE | Payer: Self-pay | Source: Ambulatory Visit

## 2011-04-26 ENCOUNTER — Ambulatory Visit (HOSPITAL_COMMUNITY): Admission: RE | Admit: 2011-04-26 | Payer: Medicare Other | Source: Ambulatory Visit | Admitting: Cardiology

## 2011-04-26 SURGERY — RENAL ANGIOGRAM
Anesthesia: LOCAL

## 2011-05-29 ENCOUNTER — Other Ambulatory Visit: Payer: Self-pay

## 2011-05-29 ENCOUNTER — Inpatient Hospital Stay (HOSPITAL_COMMUNITY)
Admission: EM | Admit: 2011-05-29 | Discharge: 2011-06-01 | DRG: 641 | Disposition: A | Discharge status: Home / Self Care | Payer: Medicare Other | Attending: Internal Medicine | Admitting: Internal Medicine

## 2011-05-29 ENCOUNTER — Emergency Department (HOSPITAL_COMMUNITY): Payer: Medicare Other

## 2011-05-29 ENCOUNTER — Encounter (HOSPITAL_COMMUNITY): Payer: Self-pay | Admitting: Emergency Medicine

## 2011-05-29 DIAGNOSIS — I2510 Atherosclerotic heart disease of native coronary artery without angina pectoris: Secondary | ICD-10-CM | POA: Diagnosis present

## 2011-05-29 DIAGNOSIS — F17200 Nicotine dependence, unspecified, uncomplicated: Secondary | ICD-10-CM | POA: Diagnosis present

## 2011-05-29 DIAGNOSIS — Z720 Tobacco use: Secondary | ICD-10-CM | POA: Diagnosis present

## 2011-05-29 DIAGNOSIS — Z7982 Long term (current) use of aspirin: Secondary | ICD-10-CM

## 2011-05-29 DIAGNOSIS — R531 Weakness: Secondary | ICD-10-CM | POA: Diagnosis present

## 2011-05-29 DIAGNOSIS — E876 Hypokalemia: Principal | ICD-10-CM | POA: Diagnosis present

## 2011-05-29 DIAGNOSIS — E162 Hypoglycemia, unspecified: Secondary | ICD-10-CM

## 2011-05-29 DIAGNOSIS — I739 Peripheral vascular disease, unspecified: Secondary | ICD-10-CM

## 2011-05-29 DIAGNOSIS — Z794 Long term (current) use of insulin: Secondary | ICD-10-CM

## 2011-05-29 DIAGNOSIS — I150 Renovascular hypertension: Secondary | ICD-10-CM | POA: Diagnosis present

## 2011-05-29 DIAGNOSIS — G629 Polyneuropathy, unspecified: Secondary | ICD-10-CM

## 2011-05-29 DIAGNOSIS — E119 Type 2 diabetes mellitus without complications: Secondary | ICD-10-CM | POA: Diagnosis present

## 2011-05-29 DIAGNOSIS — I10 Essential (primary) hypertension: Secondary | ICD-10-CM | POA: Diagnosis present

## 2011-05-29 DIAGNOSIS — R4182 Altered mental status, unspecified: Secondary | ICD-10-CM

## 2011-05-29 DIAGNOSIS — I70219 Atherosclerosis of native arteries of extremities with intermittent claudication, unspecified extremity: Secondary | ICD-10-CM | POA: Diagnosis present

## 2011-05-29 DIAGNOSIS — I498 Other specified cardiac arrhythmias: Secondary | ICD-10-CM | POA: Diagnosis present

## 2011-05-29 LAB — COMPREHENSIVE METABOLIC PANEL
ALT: 42 U/L — ABNORMAL HIGH (ref 0–35)
AST: 42 U/L — ABNORMAL HIGH (ref 0–37)
Calcium: 8.1 mg/dL — ABNORMAL LOW (ref 8.4–10.5)
Sodium: 138 mEq/L (ref 135–145)
Total Protein: 5.1 g/dL — ABNORMAL LOW (ref 6.0–8.3)

## 2011-05-29 LAB — CBC
MCH: 32.3 pg (ref 26.0–34.0)
MCV: 91.6 fL (ref 78.0–100.0)
Platelets: 143 10*3/uL — ABNORMAL LOW (ref 150–400)
RDW: 13.6 % (ref 11.5–15.5)
WBC: 4.1 10*3/uL (ref 4.0–10.5)

## 2011-05-29 LAB — URINALYSIS, ROUTINE W REFLEX MICROSCOPIC
Ketones, ur: NEGATIVE mg/dL
Leukocytes, UA: NEGATIVE
Nitrite: NEGATIVE
Protein, ur: >300 mg/dL — AB
Urobilinogen, UA: 0.2 mg/dL (ref 0.0–1.0)

## 2011-05-29 LAB — URINE MICROSCOPIC-ADD ON

## 2011-05-29 LAB — POCT I-STAT TROPONIN I: Troponin i, poc: 0.01 ng/mL (ref 0.00–0.08)

## 2011-05-29 LAB — GLUCOSE, CAPILLARY: Glucose-Capillary: 216 mg/dL — ABNORMAL HIGH (ref 70–99)

## 2011-05-29 LAB — MAGNESIUM: Magnesium: 1.6 mg/dL (ref 1.5–2.5)

## 2011-05-29 LAB — DIFFERENTIAL
Basophils Absolute: 0 10*3/uL (ref 0.0–0.1)
Eosinophils Absolute: 0.1 10*3/uL (ref 0.0–0.7)
Eosinophils Relative: 2 % (ref 0–5)
Lymphocytes Relative: 52 % — ABNORMAL HIGH (ref 12–46)

## 2011-05-29 MED ORDER — CLONIDINE HCL 0.1 MG PO TABS
0.2000 mg | ORAL_TABLET | Freq: Once | ORAL | Status: AC
  Administered 2011-05-29: 0.2 mg via ORAL
  Filled 2011-05-29: qty 2

## 2011-05-29 MED ORDER — POTASSIUM CHLORIDE CRYS ER 20 MEQ PO TBCR
40.0000 meq | EXTENDED_RELEASE_TABLET | Freq: Once | ORAL | Status: AC
  Administered 2011-05-29: 40 meq via ORAL
  Filled 2011-05-29: qty 2

## 2011-05-29 MED ORDER — HYDRALAZINE HCL 20 MG/ML IJ SOLN
10.0000 mg | Freq: Four times a day (QID) | INTRAMUSCULAR | Status: DC | PRN
  Administered 2011-05-29 – 2011-05-31 (×2): 10 mg via INTRAVENOUS
  Filled 2011-05-29 (×3): qty 0.5

## 2011-05-29 MED ORDER — ONDANSETRON HCL 4 MG PO TABS: 4.0000 mg | ORAL_TABLET | Freq: Four times a day (QID) | ORAL | Status: DC | PRN

## 2011-05-29 MED ORDER — POTASSIUM CHLORIDE IN NACL 20-0.9 MEQ/L-% IV SOLN
INTRAVENOUS | Status: DC
  Administered 2011-05-29: 21:00:00 via INTRAVENOUS
  Filled 2011-05-29 (×2): qty 1000

## 2011-05-29 MED ORDER — INSULIN ASPART 100 UNIT/ML ~~LOC~~ SOLN
0.0000 [IU] | Freq: Every day | SUBCUTANEOUS | Status: DC
  Administered 2011-05-29 – 2011-05-31 (×2): 2 [IU] via SUBCUTANEOUS

## 2011-05-29 MED ORDER — SODIUM CHLORIDE 0.9 % IV SOLN: INTRAVENOUS | Status: DC

## 2011-05-29 MED ORDER — LOSARTAN POTASSIUM 50 MG PO TABS
50.0000 mg | ORAL_TABLET | Freq: Every day | ORAL | Status: DC
  Administered 2011-05-29 – 2011-05-31 (×3): 50 mg via ORAL
  Filled 2011-05-29 (×4): qty 1

## 2011-05-29 MED ORDER — ONDANSETRON HCL 4 MG/2ML IJ SOLN: 4.0000 mg | Freq: Four times a day (QID) | INTRAMUSCULAR | Status: DC | PRN

## 2011-05-29 MED ORDER — ACETAMINOPHEN 325 MG PO TABS: 650.0000 mg | ORAL_TABLET | Freq: Four times a day (QID) | ORAL | Status: DC | PRN

## 2011-05-29 MED ORDER — ALPRAZOLAM 0.5 MG PO TABS: 0.5000 mg | ORAL_TABLET | Freq: Every day | ORAL | Status: DC | PRN

## 2011-05-29 MED ORDER — HYDRALAZINE HCL 10 MG PO TABS
20.0000 mg | ORAL_TABLET | Freq: Four times a day (QID) | ORAL | Status: DC
  Administered 2011-05-29 – 2011-05-31 (×8): 20 mg via ORAL
  Filled 2011-05-29 (×9): qty 2

## 2011-05-29 MED ORDER — INSULIN ASPART 100 UNIT/ML ~~LOC~~ SOLN
0.0000 [IU] | Freq: Three times a day (TID) | SUBCUTANEOUS | Status: DC
  Administered 2011-05-30: 2 [IU] via SUBCUTANEOUS
  Administered 2011-05-31: 9 [IU] via SUBCUTANEOUS
  Administered 2011-05-31: 3 [IU] via SUBCUTANEOUS
  Administered 2011-05-31: 2 [IU] via SUBCUTANEOUS

## 2011-05-29 MED ORDER — ASPIRIN 325 MG PO TABS
325.0000 mg | ORAL_TABLET | Freq: Every day | ORAL | Status: DC
  Administered 2011-05-30 – 2011-06-01 (×3): 325 mg via ORAL
  Filled 2011-05-29 (×3): qty 1

## 2011-05-29 MED ORDER — HYDROCODONE-ACETAMINOPHEN 5-325 MG PO TABS
1.0000 | ORAL_TABLET | Freq: Four times a day (QID) | ORAL | Status: DC | PRN
  Administered 2011-05-30 – 2011-05-31 (×4): 1 via ORAL
  Filled 2011-05-29 (×5): qty 1

## 2011-05-29 MED ORDER — POTASSIUM CHLORIDE 10 MEQ/100ML IV SOLN
10.0000 meq | INTRAVENOUS | Status: AC
  Administered 2011-05-29 (×2): 10 meq via INTRAVENOUS
  Filled 2011-05-29 (×2): qty 100

## 2011-05-29 MED ORDER — INSULIN DETEMIR 100 UNIT/ML ~~LOC~~ SOLN
5.0000 [IU] | Freq: Two times a day (BID) | SUBCUTANEOUS | Status: DC
  Administered 2011-05-29 – 2011-05-31 (×4): 5 [IU] via SUBCUTANEOUS
  Filled 2011-05-29: qty 10

## 2011-05-29 MED ORDER — CLONIDINE HCL 0.2 MG PO TABS
0.2000 mg | ORAL_TABLET | Freq: Two times a day (BID) | ORAL | Status: DC
  Administered 2011-05-29 – 2011-05-31 (×4): 0.2 mg via ORAL
  Filled 2011-05-29 (×5): qty 1

## 2011-05-29 MED ORDER — SODIUM CHLORIDE 0.9 % IV BOLUS (SEPSIS): 500.0000 mL | INTRAVENOUS | Status: DC

## 2011-05-29 MED ORDER — ATORVASTATIN CALCIUM 20 MG PO TABS
20.0000 mg | ORAL_TABLET | Freq: Every day | ORAL | Status: DC
  Administered 2011-05-29 – 2011-05-31 (×3): 20 mg via ORAL
  Filled 2011-05-29 (×4): qty 1

## 2011-05-29 MED ORDER — ACETAMINOPHEN 650 MG RE SUPP: 650.0000 mg | Freq: Four times a day (QID) | RECTAL | Status: DC | PRN

## 2011-05-29 MED ORDER — ISOSORBIDE MONONITRATE ER 30 MG PO TB24
30.0000 mg | ORAL_TABLET | Freq: Every day | ORAL | Status: DC
  Administered 2011-05-29 – 2011-05-31 (×3): 30 mg via ORAL
  Filled 2011-05-29 (×4): qty 1

## 2011-05-29 MED ORDER — SODIUM CHLORIDE 0.9 % IV BOLUS (SEPSIS)
500.0000 mL | INTRAVENOUS | Status: AC
  Administered 2011-05-29: 500 mL via INTRAVENOUS

## 2011-05-29 MED ORDER — CILOSTAZOL 100 MG PO TABS
100.0000 mg | ORAL_TABLET | Freq: Two times a day (BID) | ORAL | Status: DC
  Administered 2011-05-29 – 2011-06-01 (×6): 100 mg via ORAL
  Filled 2011-05-29 (×8): qty 1

## 2011-05-29 MED ORDER — POTASSIUM CHLORIDE 10 MEQ/100ML IV SOLN
10.0000 meq | INTRAVENOUS | Status: AC
Start: 2011-05-29 — End: 2011-05-30
  Administered 2011-05-29 – 2011-05-30 (×5): 10 meq via INTRAVENOUS
  Filled 2011-05-29 (×5): qty 100

## 2011-05-29 MED ORDER — METOPROLOL TARTRATE 25 MG PO TABS
25.0000 mg | ORAL_TABLET | Freq: Two times a day (BID) | ORAL | Status: DC
Start: 2011-05-29 — End: 2011-05-29

## 2011-05-29 NOTE — ED Provider Notes (Signed)
2:42 PM  Date: 05/29/2011  Rate: 49  Rhythm: sinus bradycardia  QRS Axis: normal  Intervals: QT prolonged QRS:  Poor R wave progression in precordial leads suggests possible old anterior myocardial infarction.  ST/T Wave abnormalities: normal  Conduction Disutrbances:none  Narrative Interpretation: Abnormal EKG  Old EKG Reviewed: unchanged      Carleene Cooper III, MD 05/29/11 [phone]

## 2011-05-29 NOTE — H&P (Addendum)
PCP:  Gwynneth Aliment, MD, MD   DOA:  05/29/2011  1:21 PM  Chief Complaint:  Weakness  HPI: 78 years old African American woman with history of renal artery stenosis status post stent, malignant hypertension on multiple antihypertensive regimen and other comorbidities as below. Presented to the ER today with chief complaint of generalized weakness for a couple of weeks. As the patient she had difficulty ambulating and  hold on the walls or her husband while walking. She had 2  episodes of watery nonbloody diarrhea last week which spontaneously resolved. She denies any nausea, vomiting, abdominal pain, fever or chills. She has chronic back pain for which she takes Vicodin, she denies any lower extremity numbness, denies any bowel or urinary incontinence. In the ER labs showed hypokalemia with potassium of 2, was also noted to be hypertensive. Patient was given 40 mEq of KCl by mouth and orders for 3 runs of 10 mEq KCl. We were consulted to admit for further management  Allergies: Allergies  Allergen Reactions  . Norvasc (Amlodipine Besylate) Swelling    Prior to Admission medications   Medication Sig Start Date End Date Taking? Authorizing Provider  ALPRAZolam Prudy Feeler) 0.5 MG tablet Take 0.5 mg by mouth daily as needed. For anxiety   Yes Historical Provider, MD  aspirin 325 MG tablet Take 325 mg by mouth daily.   Yes Historical Provider, MD  cilostazol (PLETAL) 100 MG tablet Take 100 mg by mouth 2 (two) times daily.   Yes Historical Provider, MD  cloNIDine (CATAPRES) 0.2 MG tablet Take 0.2 mg by mouth 2 (two) times daily. 04/06/11  Yes Rhetta Mura, MD  furosemide (LASIX) 40 MG tablet Take 40 mg by mouth daily. 04/06/11 04/05/12 Yes Rhetta Mura, MD  hydrALAZINE (APRESOLINE) 10 MG tablet Take 20 mg by mouth 4 (four) times daily. 04/06/11 04/05/12 Yes Rhetta Mura, MD  HYDROcodone-acetaminophen (VICODIN) 5-500 MG per tablet Take 1 tablet by mouth every 4 (four) hours as needed.  For pain.   Yes Historical Provider, MD  insulin detemir (LEVEMIR) 100 UNIT/ML injection Inject 5 Units into the skin 2 (two) times daily.   Yes Historical Provider, MD  isosorbide mononitrate (IMDUR) 30 MG 24 hr tablet Take 30 mg by mouth daily. 04/06/11 04/05/12 Yes Rhetta Mura, MD  losartan (COZAAR) 50 MG tablet Take 50 mg by mouth daily.   Yes Historical Provider, MD  metoprolol tartrate (LOPRESSOR) 25 MG tablet Take 25 mg by mouth 2 (two) times daily. 04/06/11 04/05/12 Yes Rhetta Mura, MD  rosuvastatin (CRESTOR) 10 MG tablet Take 10 mg by mouth daily.   Yes Historical Provider, MD    Past Medical History  Diagnosis Date  . Diabetes mellitus   . Hypertension   . Coronary artery disease   . Renal disorder   . Herniated lumbar intervertebral disc   . Cataract     cataract surgery scheduled for 03/31/11, 2 - left eye, 1 - right eye  . Renovascular hypertension      s/p left renal artery stent 12/2007.   S/P balloon angioplasty on 02/16/10 for ISR, BP was  controlled well since then.     . Claudication in peripheral vascular disease     02/16/10: Left CIA 9.0x28 Omnilink and REIA 8.0x40 seff expanding Zilver.   right subclavian artery stent 03/18/2008,  . S/P carotid endarterectomy      left carotid endarterectomy  1991; Stroke and TIA in 1999 with right sided weakness, now with residual right arm weakness  Past Surgical History  Procedure Date  . Appendectomy   . Abdominal hysterectomy   . Ureteral stent placement   . Carotid endarterectomy   . Laminectomy     Social History: Lives with her husband, reports that she has been smoking since she was 63, currently use one pack a week. She reports that she does not drink alcohol. Her drug history not on file.   Review of Systems:  Constitutional: Denies fever, chills, diaphoresis, appetite change  HEENT: Denies photophobia, eye pain, redness, hearing loss, ear pain, congestion, sore throat, rhinorrhea, sneezing, mouth  sores, trouble swallowing, neck pain, neck stiffness and tinnitus.   Respiratory: Denies SOB, DOE, cough, chest tightness,  and wheezing.   Cardiovascular: Denies chest pain, palpitations and leg swelling.  Gastrointestinal: Denies nausea, vomiting, abdominal pain, diarrhea, constipation, blood in stool and abdominal distention.  Genitourinary: Denies dysuria, urgency, frequency, hematuria, flank pain and difficulty urinating.  Neurological: Denies dizziness, seizures, syncope, weakness, light-headedness, numbness and headaches.  Hematological: Denies adenopathy. Easy bruising, personal or family bleeding history  Psychiatric/Behavioral: Denies suicidal ideation, mood changes, confusion, nervousness, sleep disturbance and agitation   Physical Exam:  Filed Vitals:   05/29/11 1606 05/29/11 1700 05/29/11 1730 05/29/11 1815  BP: 231/68 192/131 196/65 175/85  Pulse: 52 50 48 51  Temp:      TempSrc:      Resp:  18 14 19   SpO2:  99% 98% 98%    Constitutional: Vital signs reviewed.  Patient is  in no acute distress and cooperative with exam. Alert and oriented x3.  Mucous membranes noted Eyes: PERRL, EOMI, conjunctivae normal, No scleral icterus.  Neck: Supple, Trachea midline normal ROM, No JVD, mass, thyromegaly, or carotid bruit present.  Cardiovascular: RRR, S1 normal, S2 normal, no MRG, pulses symmetric and intact bilaterally Pulmonary/Chest: CTAB, no wheezes, rales, or rhonchi Abdominal: Soft. Non-tender, non-distended, bowel sounds are normal, no masses, organomegaly, or guarding present.  Ext: no edema and no cyanosis, dry scaly skin in both lower extremities  Neurological: A&O x3, Strenght is normal and symmetric bilaterally, cranial nerve II-XII are grossly intact, no focal motor deficit, sensory intact to light touch bilaterally.  .   Labs on Admission:  Results for orders placed during the hospital encounter of 05/29/11 (from the past 48 hour(s))  GLUCOSE, CAPILLARY     Status:  Abnormal   Collection Time   05/29/11  3:03 PM      Component Value Range Comment   Glucose-Capillary 264 (*) 70 - 99 (mg/dL)   CBC     Status: Abnormal   Collection Time   05/29/11  3:44 PM      Component Value Range Comment   WBC 4.1  4.0 - 10.5 (K/uL)    RBC 4.27  3.87 - 5.11 (MIL/uL)    Hemoglobin 13.8  12.0 - 15.0 (g/dL)    HCT 16.1  09.6 - 04.5 (%)    MCV 91.6  78.0 - 100.0 (fL)    MCH 32.3  26.0 - 34.0 (pg)    MCHC 35.3  30.0 - 36.0 (g/dL)    RDW 40.9  81.1 - 91.4 (%)    Platelets 143 (*) 150 - 400 (K/uL)   DIFFERENTIAL     Status: Abnormal   Collection Time   05/29/11  3:44 PM      Component Value Range Comment   Neutrophils Relative 41 (*) 43 - 77 (%)    Neutro Abs 1.7  1.7 - 7.7 (K/uL)  Lymphocytes Relative 52 (*) 12 - 46 (%)    Lymphs Abs 2.1  0.7 - 4.0 (K/uL)    Monocytes Relative 4  3 - 12 (%)    Monocytes Absolute 0.2  0.1 - 1.0 (K/uL)    Eosinophils Relative 2  0 - 5 (%)    Eosinophils Absolute 0.1  0.0 - 0.7 (K/uL)    Basophils Relative 0  0 - 1 (%)    Basophils Absolute 0.0  0.0 - 0.1 (K/uL)   COMPREHENSIVE METABOLIC PANEL     Status: Abnormal   Collection Time   05/29/11  3:44 PM      Component Value Range Comment   Sodium 138  135 - 145 (mEq/L)    Potassium 2.0 (*) 3.5 - 5.1 (mEq/L)    Chloride 99  96 - 112 (mEq/L)    CO2 35 (*) 19 - 32 (mEq/L)    Glucose, Bld 230 (*) 70 - 99 (mg/dL)    BUN 11  6 - 23 (mg/dL)    Creatinine, Ser 1.61  0.50 - 1.10 (mg/dL)    Calcium 8.1 (*) 8.4 - 10.5 (mg/dL)    Total Protein 5.1 (*) 6.0 - 8.3 (g/dL)    Albumin 2.4 (*) 3.5 - 5.2 (g/dL)    AST 42 (*) 0 - 37 (U/L)    ALT 42 (*) 0 - 35 (U/L)    Alkaline Phosphatase 116  39 - 117 (U/L)    Total Bilirubin 0.2 (*) 0.3 - 1.2 (mg/dL)    GFR calc non Af Amer 66 (*) >90 (mL/min)    GFR calc Af Amer 77 (*) >90 (mL/min)   POCT I-STAT TROPONIN I     Status: Normal   Collection Time   05/29/11  4:00 PM      Component Value Range Comment   Troponin i, poc 0.01  0.00 - 0.08  (ng/mL)    Comment 3            URINALYSIS, ROUTINE W REFLEX MICROSCOPIC     Status: Abnormal   Collection Time   05/29/11  4:32 PM      Component Value Range Comment   Color, Urine YELLOW  YELLOW     APPearance CLEAR  CLEAR     Specific Gravity, Urine 1.014  1.005 - 1.030     pH 6.0  5.0 - 8.0     Glucose, UA 100 (*) NEGATIVE (mg/dL)    Hgb urine dipstick NEGATIVE  NEGATIVE     Bilirubin Urine NEGATIVE  NEGATIVE     Ketones, ur NEGATIVE  NEGATIVE (mg/dL)    Protein, ur >096 (*) NEGATIVE (mg/dL)    Urobilinogen, UA 0.2  0.0 - 1.0 (mg/dL)    Nitrite NEGATIVE  NEGATIVE     Leukocytes, UA NEGATIVE  NEGATIVE    URINE MICROSCOPIC-ADD ON     Status: Abnormal   Collection Time   05/29/11  4:32 PM      Component Value Range Comment   Squamous Epithelial / LPF FEW (*) RARE      Radiological Exams on Admission: Dg Chest Port 1 View  05/29/2011  *RADIOLOGY REPORT*  Clinical Data: Weakness.  Hypertensive.  Diabetic.  Smoker.  PORTABLE CHEST - 1 VIEW  Comparison: 03/28/2011 and 12/07/2007.  Findings: Granuloma peripheral aspect the left lung unchanged compared to 2009.  Right subclavian artery stent in place.  Cardiomegaly.  Pulmonary vascular prominence most notable centrally.  No frank pulmonary edema, segmental infiltrate or gross pneumothorax.  Calcified aorta.  IMPRESSION: Cardiomegaly.  Central pulmonary vascular prominence.  Original Report Authenticated By: Fuller Canada, M.D.     Assessment/Plan Principal Problem:  *Weakness generalized Most likely secondary to hypokalemia, no neurological deficit appreciated on exam. Replete K. and consult PT Active Problems:  Hypokalemia Patient with history of renal artery stenosis, she was also on diuretics without potassium supplementation. Supplement K. aggressively, monitor on telemetry. follow repeat K. at 2100 and supplement as needed. Check a mag level (would add on) Sinus bradycardia: Monitor in telemetry, hold metoprolol.  Tobacco  abuse Patient counseled on cessation, she is enjoying smoking and not interested on quitting, declined nicotine patch.  DM (diabetes mellitus), type 2 Continue Levimir , place on SSI, check hemoglobin A1c.  Renovascular hypertension Continue current antihypertensive regimen and adjust when necessary. Add IV hydralazine when necessary Renal artery stenosis status post stent: Follow with Dr. Jacinto Halim as an outpatient. CODE STATUS: Full code Time Spent on Admission: Approximately 45 minutes  Carmelite Violet 05/29/2011, 6:59 PM

## 2011-05-29 NOTE — ED Notes (Signed)
Awaiting bed assignment. Informed patient and/or family of status.   

## 2011-05-29 NOTE — ED Provider Notes (Signed)
History     CSN: [id number]  Arrival date & time 05/29/11  1315   First MD Initiated Contact with Patient 05/29/11 1501      Chief Complaint  Patient presents with  . Weakness    (Consider location/radiation/quality/duration/timing/severity/associated sxs/prior treatment) HPI  Patient relates she was in the hospital about 3 weeks ago however looking at her old chart it was actually in January. She states at that point he started her on a new blood pressure medicine which was hydralazine. She relates since he started that she's been feeling very weak. She relates she has decreased appetite and has had about a 10 pound weight loss. She notes that her skin has been wearing clean and scaly. She states for the past week she's had no strength in her legs when she walks she has to hold onto things. She states she has a walker but she doesn't want to use it. She denies cough, chest pain, shortness of breath, abdominal pain, headache or any pain, nausea, vomiting, feeling lightheaded, or dizzy. She states she did have 2 large bouts of watery diarrhea in the past week. She states she's never felt this way before.  PCP Dr. Velna Hatchet Cardiologist Dr. Jacinto Halim  Past Medical History  Diagnosis Date  . Diabetes mellitus   . Hypertension   . Coronary artery disease   . Renal disorder   . Herniated lumbar intervertebral disc   . Cataract     cataract surgery scheduled for 03/31/11, 2 - left eye, 1 - right eye  . Renovascular hypertension      s/p left renal artery stent 12/2007.   S/P balloon angioplasty on 02/16/10 for ISR, BP was  controlled well since then.     . Claudication in peripheral vascular disease     02/16/10: Left CIA 9.0x28 Omnilink and REIA 8.0x40 seff expanding Zilver.   right subclavian artery stent 03/18/2008,  . S/P carotid endarterectomy      left carotid endarterectomy  1991; Stroke and TIA in 1999 with right sided weakness, now with residual right arm weakness    Past  Surgical History  Procedure Date  . Appendectomy   . Abdominal hysterectomy   . Ureteral stent placement   . Carotid endarterectomy   . Laminectomy     No family history on file.  History  Substance Use Topics  . Smoking status: Current Everyday Smoker -- 0.5 packs/day  . Smokeless tobacco: Never Used  . Alcohol Use: No   lives at home with her spouse and 2 sons.  OB History    Grav Para Term Preterm Abortions TAB SAB Ect Mult Living                  Review of Systems  All other systems reviewed and are negative.    Allergies  Norvasc  Home Medications   Current Outpatient Rx  Name Route Sig Dispense Refill  . ALPRAZOLAM 0.5 MG PO TABS Oral Take 0.5 mg by mouth daily as needed. For anxiety    . ASPIRIN 325 MG PO TABS Oral Take 325 mg by mouth daily.    Marland Kitchen CILOSTAZOL 100 MG PO TABS Oral Take 100 mg by mouth 2 (two) times daily.    Marland Kitchen CLONIDINE HCL 0.2 MG PO TABS Oral Take 0.2 mg by mouth 2 (two) times daily.    . FUROSEMIDE 40 MG PO TABS Oral Take 40 mg by mouth daily.    Marland Kitchen HYDRALAZINE HCL 10 MG PO TABS  Oral Take 20 mg by mouth 4 (four) times daily.    Marland Kitchen HYDROCODONE-ACETAMINOPHEN 5-500 MG PO TABS Oral Take 1 tablet by mouth every 4 (four) hours as needed. For pain.    . INSULIN DETEMIR 100 UNIT/ML [place] SOLN Subcutaneous Inject 5 Units into the skin 2 (two) times daily.    . ISOSORBIDE MONONITRATE ER 30 MG PO TB24 Oral Take 30 mg by mouth daily.    Marland Kitchen LOSARTAN POTASSIUM 50 MG PO TABS Oral Take 50 mg by mouth daily.    Marland Kitchen METOPROLOL TARTRATE 25 MG PO TABS Oral Take 25 mg by mouth 2 (two) times daily.    Marland Kitchen ROSUVASTATIN CALCIUM 10 MG PO TABS Oral Take 10 mg by mouth daily.      BP 231/68  Pulse 52  Temp(Src) 98 F (36.7 C) (Oral)  Resp 15  SpO2 98%  Vital signs normal except hypertension and bradycardia ( heart rate was 48 during my exam)   Physical Exam  Nursing note and vitals reviewed. Constitutional: She is oriented to person, place, and time. She appears  well-developed and well-nourished.  Non-toxic appearance. She does not appear ill. No distress.  HENT:  Head: Normocephalic and atraumatic.  Right Ear: External ear normal.  Left Ear: External ear normal.  Nose: Nose normal. No mucosal edema or rhinorrhea.  Mouth/Throat: Mucous membranes are normal. No dental abscesses or uvula swelling.       Mucous membranes are dry  Eyes: Conjunctivae and EOM are normal. Pupils are equal, round, and reactive to light.  Neck: Normal range of motion and full passive range of motion without pain. Neck supple.  Cardiovascular: Normal rate, regular rhythm and normal heart sounds.  Exam reveals no gallop and no friction rub.   No murmur heard. Pulmonary/Chest: Effort normal and breath sounds normal. No respiratory distress. She has no wheezes. She has no rhonchi. She has no rales. She exhibits no tenderness and no crepitus.  Abdominal: Soft. Normal appearance and bowel sounds are normal. She exhibits no distension. There is no tenderness. There is no rebound and no guarding.  Musculoskeletal: Normal range of motion. She exhibits no edema and no tenderness.       Moves all extremities well.   Neurological: She is alert and oriented to person, place, and time. She has normal strength. No cranial nerve deficit.  Skin: Skin is warm, dry and intact. Rash noted. No erythema. No pallor.       Patient said to have marked decreased skin turgor, has dry scaly skin.  Psychiatric: Her speech is normal and behavior is normal. Her mood appears not anxious.       Affect is full    ED Course  Procedures (including critical care time)   Medications  0.9 %  sodium chloride infusion (not administered)  potassium chloride 10 mEq in 100 mL IVPB (not administered)  potassium chloride SA (K-DUR,KLOR-CON) CR tablet 40 mEq (not administered)  sodium chloride 0.9 % bolus 500 mL (500 mL Intravenous Given 05/29/11 1631)  cloNIDine (CATAPRES) tablet 0.2 mg (0.2 mg Oral Given 05/29/11  1612)   Dr Cleotis Lema, admit to tele, team 4 to her  Results for orders placed during the hospital encounter of 05/29/11  GLUCOSE, CAPILLARY      Component Value Range   Glucose-Capillary 264 (*) 70 - 99 (mg/dL)  CBC      Component Value Range   WBC 4.1  4.0 - 10.5 (K/uL)   RBC 4.27  3.87 - 5.11 (  MIL/uL)   Hemoglobin 13.8  12.0 - 15.0 (g/dL)   HCT 45.4  09.8 - 11.9 (%)   MCV 91.6  78.0 - 100.0 (fL)   MCH 32.3  26.0 - 34.0 (pg)   MCHC 35.3  30.0 - 36.0 (g/dL)   RDW 14.7  82.9 - 56.2 (%)   Platelets 143 (*) 150 - 400 (K/uL)  DIFFERENTIAL      Component Value Range   Neutrophils Relative 41 (*) 43 - 77 (%)   Neutro Abs 1.7  1.7 - 7.7 (K/uL)   Lymphocytes Relative 52 (*) 12 - 46 (%)   Lymphs Abs 2.1  0.7 - 4.0 (K/uL)   Monocytes Relative 4  3 - 12 (%)   Monocytes Absolute 0.2  0.1 - 1.0 (K/uL)   Eosinophils Relative 2  0 - 5 (%)   Eosinophils Absolute 0.1  0.0 - 0.7 (K/uL)   Basophils Relative 0  0 - 1 (%)   Basophils Absolute 0.0  0.0 - 0.1 (K/uL)  COMPREHENSIVE METABOLIC PANEL      Component Value Range   Sodium 138  135 - 145 (mEq/L)   Potassium 2.0 (*) 3.5 - 5.1 (mEq/L)   Chloride 99  96 - 112 (mEq/L)   CO2 35 (*) 19 - 32 (mEq/L)   Glucose, Bld 230 (*) 70 - 99 (mg/dL)   BUN 11  6 - 23 (mg/dL)   Creatinine, Ser 1.30  0.50 - 1.10 (mg/dL)   Calcium 8.1 (*) 8.4 - 10.5 (mg/dL)   Total Protein 5.1 (*) 6.0 - 8.3 (g/dL)   Albumin 2.4 (*) 3.5 - 5.2 (g/dL)   AST 42 (*) 0 - 37 (U/L)   ALT 42 (*) 0 - 35 (U/L)   Alkaline Phosphatase 116  39 - 117 (U/L)   Total Bilirubin 0.2 (*) 0.3 - 1.2 (mg/dL)   GFR calc non Af Amer 66 (*) >90 (mL/min)   GFR calc Af Amer 77 (*) >90 (mL/min)  URINALYSIS, ROUTINE W REFLEX MICROSCOPIC      Component Value Range   Color, Urine YELLOW  YELLOW    APPearance CLEAR  CLEAR    Specific Gravity, Urine 1.014  1.005 - 1.030    pH 6.0  5.0 - 8.0    Glucose, UA 100 (*) NEGATIVE (mg/dL)   Hgb urine dipstick NEGATIVE  NEGATIVE    Bilirubin Urine NEGATIVE   NEGATIVE    Ketones, ur NEGATIVE  NEGATIVE (mg/dL)   Protein, ur >865 (*) NEGATIVE (mg/dL)   Urobilinogen, UA 0.2  0.0 - 1.0 (mg/dL)   Nitrite NEGATIVE  NEGATIVE    Leukocytes, UA NEGATIVE  NEGATIVE   POCT I-STAT TROPONIN I      Component Value Range   Troponin i, poc 0.01  0.00 - 0.08 (ng/mL)   Comment 3           URINE MICROSCOPIC-ADD ON      Component Value Range   Squamous Epithelial / LPF FEW (*) RARE     Laboratory interpretation all normal except moderate hypokalemia, hyperglycemia  Dg Chest Port 1 View  05/29/2011  *RADIOLOGY REPORT*  Clinical Data: Weakness.  Hypertensive.  Diabetic.  Smoker.  PORTABLE CHEST - 1 VIEW  Comparison: 03/28/2011 and 12/07/2007.  Findings: Granuloma peripheral aspect the left lung unchanged compared to 2009.  Right subclavian artery stent in place.  Cardiomegaly.  Pulmonary vascular prominence most notable centrally.  No frank pulmonary edema, segmental infiltrate or gross pneumothorax.  Calcified aorta.  IMPRESSION: Cardiomegaly.  Central pulmonary vascular prominence.  Original Report Authenticated By: Fuller Canada, M.D.    Date: 05/29/2011  Rate: 40  Rhythm: sinus bradycardia  QRS Axis: normal  Intervals: normal  ST/T Wave abnormalities: nonspecific ST/T changes  Conduction Disutrbances:none  Narrative Interpretation:   Old EKG Reviewed: unchanged frp, 03/28/2011     1. Weakness   2. Hypokalemia    Plan admission  Devoria Albe, MD, FACEP    MDM          Ward Givens, MD 05/30/11 [phone]

## 2011-05-29 NOTE — ED Notes (Signed)
Pt. Stated, i was at [facility] cause they couldn't wake me up.  I've been weak and unable to use my legs in 3 weeks.

## 2011-05-30 ENCOUNTER — Encounter (HOSPITAL_COMMUNITY): Payer: Self-pay | Admitting: *Deleted

## 2011-05-30 LAB — MAGNESIUM: Magnesium: 1.6 mg/dL (ref 1.5–2.5)

## 2011-05-30 LAB — BASIC METABOLIC PANEL
CO2: 27 mEq/L (ref 19–32)
Calcium: 7.6 mg/dL — ABNORMAL LOW (ref 8.4–10.5)
Potassium: 2.5 mEq/L — CL (ref 3.5–5.1)
Sodium: 137 mEq/L (ref 135–145)

## 2011-05-30 LAB — GLUCOSE, CAPILLARY
Glucose-Capillary: 80 mg/dL (ref 70–99)
Glucose-Capillary: 152 mg/dL — ABNORMAL HIGH (ref 70–99)

## 2011-05-30 LAB — HEMOGLOBIN A1C: Mean Plasma Glucose: 332 mg/dL — ABNORMAL HIGH (ref <117)

## 2011-05-30 MED ORDER — POTASSIUM CHLORIDE CRYS ER 20 MEQ PO TBCR
40.0000 meq | EXTENDED_RELEASE_TABLET | Freq: Three times a day (TID) | ORAL | Status: AC
  Administered 2011-05-30 (×3): 40 meq via ORAL
  Filled 2011-05-30 (×3): qty 2

## 2011-05-30 MED ORDER — MAGNESIUM CHLORIDE 64 MG PO TBEC
1.0000 | DELAYED_RELEASE_TABLET | Freq: Two times a day (BID) | ORAL | Status: DC
  Administered 2011-05-30 – 2011-06-01 (×5): 64 mg via ORAL
  Filled 2011-05-30 (×7): qty 1

## 2011-05-30 NOTE — Progress Notes (Signed)
CRITICAL VALUE ALERT  Critical value received: 2.4  Date of notification: 05/29/11  Time of notification:  2030  Critical value read back:yes  Nurse who received alert:  Irena Reichmann  MD notified (1st page):  callahan  Time of first page:  2032  MD notified (2nd page):  Time of second page:  Responding MD:  callahan  Time MD responded:  2035

## 2011-05-30 NOTE — Progress Notes (Signed)
PT Cancellation Note  Evaluation cancelled today due to medical issues with patient which prohibited therapy. Pt with low potassium (2.5), will attempt to see pt after repletion complete as time allows. Otherwise will complete evaluation on 3/25. Thanks!  Chantee Cerino (Beverely Pace) Carleene Mains PT, DPT Acute Rehabilitation [phone]

## 2011-05-30 NOTE — Progress Notes (Signed)
Subjective: C/o some pain in the right shoulder           Physical Exam: Blood pressure 137/58, pulse 59, temperature 98.2 F (36.8 C), temperature source Oral, resp. rate 18, weight 65.1 kg (143 lb 8.3 oz), SpO2 92.00%. Patient Vitals for the past 24 hrs:  BP Temp Temp src Pulse Resp SpO2 Weight  05/30/11 0527 137/58 mmHg 98.2 F (36.8 C) Oral 59  18  92 % -  05/30/11 0116 - - - - - - 65.1 kg (143 lb 8.3 oz)  05/29/11 2230 179/55 mmHg - - 67  - 95 % -  05/29/11 2056 220/66 mmHg - - - - - -  05/29/11 1955 220/72 mmHg 97.9 F (36.6 C) Oral 57  19  100 % -  05/29/11 1915 - - - 48  17  94 % -  05/29/11 1845 200/120 mmHg - - 51  13  100 % -  05/29/11 1815 175/85 mmHg - - 51  19  98 % -  05/29/11 1730 196/65 mmHg - - 48  14  98 % -  05/29/11 1700 192/131 mmHg - - 50  18  99 % -  05/29/11 1606 231/68 mmHg - - 52  - - -  05/29/11 1602 244/81 mmHg - - 56  - 99 % -  05/29/11 1600 214/72 mmHg - - 53  - - -  05/29/11 1530 204/58 mmHg - - 49  15  98 % -  05/29/11 1500 194/58 mmHg - - 47  16  98 % -  05/29/11 1430 192/66 mmHg - - 48  17  97 % -  05/29/11 1331 176/55 mmHg 98 F (36.7 C) Oral 53  18  96 % -    Alert and oriented x3 Cvs: rrr Rs: ctab  Contracture in th right upper extremity   Investigations:  No results found for this or any previous visit (from the past 240 hour(s)).   Basic Metabolic Panel:  Basename 05/29/11 2033 05/29/11 1544 05/29/11 1533  NA -- 138 --  K 2.4* 2.0* --  CL -- 99 --  CO2 -- 35* --  GLUCOSE -- 230* --  BUN -- 11 --  CREATININE -- 0.84 --  CALCIUM -- 8.1* --  MG -- -- 1.6  PHOS -- -- --   Liver Function Tests:  Basename 05/29/11 1544  AST 42*  ALT 42*  ALKPHOS 116  BILITOT 0.2*  PROT 5.1*  ALBUMIN 2.4*     CBC:  Basename 05/29/11 1544  WBC 4.1  NEUTROABS 1.7  HGB 13.8  HCT 39.1  MCV 91.6  PLT 143*    Dg Chest Port 1 View  05/29/2011  *RADIOLOGY REPORT*  Clinical Data: Weakness.  Hypertensive.  Diabetic.  Smoker.   PORTABLE CHEST - 1 VIEW  Comparison: 03/28/2011 and 12/07/2007.  Findings: Granuloma peripheral aspect the left lung unchanged compared to 2009.  Right subclavian artery stent in place.  Cardiomegaly.  Pulmonary vascular prominence most notable centrally.  No frank pulmonary edema, segmental infiltrate or gross pneumothorax.  Calcified aorta.  IMPRESSION: Cardiomegaly.  Central pulmonary vascular prominence.  Original Report Authenticated By: Fuller Canada, M.D.      Medications:  Scheduled:   . aspirin  325 mg Oral Daily  . atorvastatin  20 mg Oral q1800  . cilostazol  100 mg Oral BID  . cloNIDine  0.2 mg Oral Once  . cloNIDine  0.2 mg Oral BID  . hydrALAZINE  20 mg  Oral QID  . insulin aspart  0-5 Units Subcutaneous QHS  . insulin aspart  0-9 Units Subcutaneous TID WC  . insulin detemir  5 Units Subcutaneous BID  . isosorbide mononitrate  30 mg Oral Daily  . losartan  50 mg Oral Daily  . magnesium chloride  1 tablet Oral BID  . potassium chloride  10 mEq Intravenous Q1 Hr x 3  . potassium chloride  10 mEq Intravenous Q1 Hr x 5  . potassium chloride  40 mEq Oral Once  . potassium chloride  40 mEq Oral TID WC  . sodium chloride  500 mL Intravenous STAT  . DISCONTD: metoprolol tartrate  25 mg Oral BID  . DISCONTD: sodium chloride  500 mL Intravenous STAT  . DISCONTD: sodium chloride  500 mL Intravenous STAT   Continuous:   . DISCONTD: sodium chloride 75 mL/hr at 05/29/11 1700  . DISCONTD: sodium chloride    . DISCONTD: 0.9 % NaCl with KCl 20 mEq / L 75 mL/hr at 05/29/11 2054   ZOX:[id number], acetaminophen, ALPRAZolam, hydrALAZINE, HYDROcodone-acetaminophen, ondansetron (ZOFRAN) IV, ondansetron  Impression:  Principal Problem:  *Weakness generalized Active Problems:  Hypokalemia  Tobacco abuse  DM (diabetes mellitus), type 2  Renovascular hypertension     Plan: Continue to replete potassium Replete magnesium Await PT consult Cardiac wise she seems stable      LOS: 1 day   Carmen Puebla, MD Pager: [phone] 05/30/2011, 7:33 AM

## 2011-05-30 NOTE — Progress Notes (Signed)
CRITICAL VALUE ALERT  Critical value received:  2.5 K  Date of notification:  05/30/11  Time of notification:  0740  Critical value read back:yes  Nurse who received alert:  Mersadie Kavanaugh, RN  MD notified (1st page):  Dr. Lavera Guise  Time of first page:  [phone]  MD notified (2nd page):  Time of second page:  Responding MD:  Dr. Lavera Guise  Time MD responded:  [phone]

## 2011-05-31 LAB — CBC
MCH: 32.2 pg (ref 26.0–34.0)
MCV: 91.6 fL (ref 78.0–100.0)
Platelets: 138 10*3/uL — ABNORMAL LOW (ref 150–400)
RDW: 14.2 % (ref 11.5–15.5)
WBC: 4.9 10*3/uL (ref 4.0–10.5)

## 2011-05-31 LAB — BASIC METABOLIC PANEL
CO2: 26 mEq/L (ref 19–32)
Calcium: 8.3 mg/dL — ABNORMAL LOW (ref 8.4–10.5)
Creatinine, Ser: 0.72 mg/dL (ref 0.50–1.10)
Glucose, Bld: 173 mg/dL — ABNORMAL HIGH (ref 70–99)

## 2011-05-31 LAB — GLUCOSE, CAPILLARY
Glucose-Capillary: 431 mg/dL — ABNORMAL HIGH (ref 70–99)
Glucose-Capillary: 237 mg/dL — ABNORMAL HIGH (ref 70–99)

## 2011-05-31 MED ORDER — POTASSIUM CHLORIDE CRYS ER 20 MEQ PO TBCR
40.0000 meq | EXTENDED_RELEASE_TABLET | Freq: Three times a day (TID) | ORAL | Status: AC
  Administered 2011-05-31 (×3): 40 meq via ORAL
  Filled 2011-05-31 (×3): qty 2

## 2011-05-31 MED ORDER — INSULIN DETEMIR 100 UNIT/ML ~~LOC~~ SOLN
8.0000 [IU] | Freq: Two times a day (BID) | SUBCUTANEOUS | Status: DC
  Administered 2011-05-31 – 2011-06-01 (×2): 8 [IU] via SUBCUTANEOUS

## 2011-05-31 MED ORDER — HYDRALAZINE HCL 50 MG PO TABS
50.0000 mg | ORAL_TABLET | Freq: Four times a day (QID) | ORAL | Status: DC
  Administered 2011-05-31 – 2011-06-01 (×2): 50 mg via ORAL
  Filled 2011-05-31 (×5): qty 1

## 2011-05-31 MED ORDER — CLONIDINE HCL 0.2 MG PO TABS
0.2000 mg | ORAL_TABLET | Freq: Three times a day (TID) | ORAL | Status: DC
  Administered 2011-05-31 – 2011-06-01 (×2): 0.2 mg via ORAL
  Filled 2011-05-31 (×3): qty 1

## 2011-05-31 NOTE — Progress Notes (Signed)
Notified Dr. Adela Glimpse that patient's BP is 216/65. No new orders given. Dr. Adela Glimpse stated to check BP in 1 hr. Wiil continue to monitor. Carmen [name]

## 2011-05-31 NOTE — Progress Notes (Signed)
Notified Dr. Adela Glimpse that patient's BP is now 196/69 HR is 64.  Told Dr. Adela Glimpse that patient scheduled to get Apresoline 50mg  po now. Dr. Adela Glimpse stated good BP came down. No new orders given. Dr. Adela Glimpse stated to give the Apresoline as scheduled. Will continue to monitor patient. Nelda Marseille, RN

## 2011-05-31 NOTE — Progress Notes (Signed)
Utilization Review Completed.[employer] T3/25/2013   

## 2011-05-31 NOTE — Progress Notes (Signed)
05/31/11 NSG 1930 Return call from Dr. Adela Glimpse, informed of manually BP 230/79 now.   Ordered to give Clonidine 0 .2mg  now she changing to tid.  Will continue to monitor.  Forbes Cellar, RN

## 2011-05-31 NOTE — Progress Notes (Signed)
05/31/11 NSG 1750 Pt.  BP was 200/60 manually in left arm, 10 mg Hydralazine given as ordered prn,  CBG was 431.  Dr. Lavera Guise notified of both and informed to give 9 units of insulin  per SSI and not to get stat glucose.  Insulin given and will continue to monitor and re-check both.  Forbes Cellar, RN

## 2011-05-31 NOTE — Progress Notes (Signed)
Subjective: No new events Getting stronger each day    Physical Exam: Blood pressure 204/80, pulse 63, temperature 98.3 F (36.8 C), temperature source Oral, resp. rate 17, height 5\' 4"  (1.626 m), weight 65.1 kg (143 lb 8.3 oz), SpO2 93.00%. Patient Vitals for the past 24 hrs:  BP Temp Temp src Pulse Resp SpO2 Height  05/31/11 0523 204/80 mmHg 98.3 F (36.8 C) Oral 63  17  93 % -  05/30/11 2114 210/69 mmHg 98.5 F (36.9 C) Oral 64  17  92 % -  05/30/11 1717 - - - - - - 5\' 4"  (1.626 m)  05/30/11 1439 140/60 mmHg 98.1 F (36.7 C) Oral 60  18  94 % -    Alert and oriented x3 Cvs: rrr Rs: ctab  Contracture in th right upper extremity  Ambulated with PT  Investigations:  No results found for this or any previous visit (from the past 240 hour(s)).   Basic Metabolic Panel:  Basename 05/31/11 0531 05/30/11 0650  NA 138 137  K 3.2* 2.5*  CL 106 104  CO2 26 27  GLUCOSE 173* 102*  BUN 10 11  CREATININE 0.72 0.69  CALCIUM 8.3* 7.6*  MG 1.7 1.6  PHOS -- --   Liver Function Tests:  [facility] 05/29/11 1544  AST 42*  ALT 42*  ALKPHOS 116  BILITOT 0.2*  PROT 5.1*  ALBUMIN 2.4*     CBC:  Basename 05/31/11 0531 05/29/11 1544  WBC 4.9 4.1  NEUTROABS -- 1.7  HGB 13.8 13.8  HCT 39.3 39.1  MCV 91.6 91.6  PLT 138* 143*    Dg Chest Port 1 View  05/29/2011  *RADIOLOGY REPORT*  Clinical Data: Weakness.  Hypertensive.  Diabetic.  Smoker.  PORTABLE CHEST - 1 VIEW  Comparison: 03/28/2011 and 12/07/2007.  Findings: Granuloma peripheral aspect the left lung unchanged compared to 2009.  Right subclavian artery stent in place.  Cardiomegaly.  Pulmonary vascular prominence most notable centrally.  No frank pulmonary edema, segmental infiltrate or gross pneumothorax.  Calcified aorta.  IMPRESSION: Cardiomegaly.  Central pulmonary vascular prominence.  Original Report Authenticated By: Fuller Canada, M.D.      Medications:  Scheduled:    . aspirin  325 mg Oral Daily  .  atorvastatin  20 mg Oral q1800  . cilostazol  100 mg Oral BID  . cloNIDine  0.2 mg Oral BID  . hydrALAZINE  20 mg Oral QID  . insulin aspart  0-5 Units Subcutaneous QHS  . insulin aspart  0-9 Units Subcutaneous TID WC  . insulin detemir  5 Units Subcutaneous BID  . isosorbide mononitrate  30 mg Oral Daily  . losartan  50 mg Oral Daily  . magnesium chloride  1 tablet Oral BID  . potassium chloride  40 mEq Oral TID WC  . potassium chloride  40 mEq Oral TID   Continuous:  WUJ:[id number], acetaminophen, ALPRAZolam, hydrALAZINE, HYDROcodone-acetaminophen, ondansetron (ZOFRAN) IV, ondansetron  Impression:  Principal Problem:  *Weakness generalized Active Problems:  Hypokalemia  Tobacco abuse  DM (diabetes mellitus), type 2  Renovascular hypertension  Hypomagnesemia     Plan: Continue to replete potassium Replete magnesium Hopefully home tomorrow     LOS: 2 days   Carmen Higuchi, MD Pager: [phone] 05/31/2011, 2:29 PM

## 2011-05-31 NOTE — Progress Notes (Signed)
05/31/11 NSG 1920 BP 240/85 manually gave IV Hydralazine 10mg  at 1700 for BP 200/60 and scheduled Hydrazaline 20mg  po at 1900.  Dr. Adela Glimpse texted paged and rapid response nurse called.  Will continue to monitor and await return call or new orders from MD.  Forbes Cellar, RN

## 2011-05-31 NOTE — Evaluation (Addendum)
Physical Therapy Evaluation Patient Details Name: Carmen [name] MRN: [id number] DOB: Feb 14, 1934 Today's Date: 05/31/2011  Problem List:  Patient Active Problem List  Diagnoses  . Altered mental state  . Hypoglycemia  . HTN (hypertension), malignant  . Hypokalemia  . Tobacco abuse  . DM (diabetes mellitus), type 2  . Peripheral neuropathy  . Claudication in peripheral vascular disease  . Renovascular hypertension  . Weakness generalized  . Hypomagnesemia    Past Medical History:  Past Medical History  Diagnosis Date  . Diabetes mellitus   . Hypertension   . Coronary artery disease   . Renal disorder   . Herniated lumbar intervertebral disc   . Cataract     cataract surgery scheduled for 03/31/11, 2 - left eye, 1 - right eye  . Renovascular hypertension      s/p left renal artery stent 12/2007.   S/P balloon angioplasty on 02/16/10 for ISR, BP was  controlled well since then.     . Claudication in peripheral vascular disease     02/16/10: Left CIA 9.0x28 Omnilink and REIA 8.0x40 seff expanding [place].   right subclavian artery stent 03/18/2008,  . S/P carotid endarterectomy      left carotid endarterectomy  1991; Stroke and TIA in 1999 with right sided weakness, now with residual right arm weakness   Past Surgical History:  Past Surgical History  Procedure Date  . Appendectomy   . Abdominal hysterectomy   . Ureteral stent placement   . Carotid endarterectomy   . Laminectomy     PT Assessment/Plan/Recommendation PT Assessment Clinical Impression Statement: Pt adm with hypokalemia and resulting weakness.  Pt with improving strength as postassium replenished.  Should be able to return home with family.  Doubt will need any follow-up PT. PT Recommendation/Assessment: Patient will need skilled PT in the acute care venue PT Problem List: Decreased strength;Decreased balance;Decreased mobility PT Therapy Diagnosis : Difficulty walking;Generalized weakness PT Plan PT  Frequency: Min 3X/week PT Treatment/Interventions: DME instruction;Gait training;Functional mobility training;Therapeutic activities;Therapeutic exercise;Balance training;Patient/family education PT Recommendation Follow Up Recommendations: No PT follow up Equipment Recommended: None recommended by PT PT Goals  Acute Rehab PT Goals PT Goal Formulation: With patient Time For Goal Achievement: 7 days Pt will go Supine/Side to Sit: with modified independence PT Goal: Supine/Side to Sit - Progress: Goal set today Pt will go Sit to Supine/Side: with modified independence PT Goal: Sit to Supine/Side - Progress: Goal set today Pt will go Sit to Stand: with modified independence PT Goal: Sit to Stand - Progress: Goal set today Pt will go Stand to Sit: with modified independence PT Goal: Stand to Sit - Progress: Goal set today Pt will Ambulate: 51 - 150 feet;with supervision;with least restrictive assistive device PT Goal: Ambulate - Progress: Goal set today  PT Evaluation Precautions/Restrictions  Precautions Precautions: Fall Restrictions Weight Bearing Restrictions: No Prior Functioning  Home Living Lives With: Spouse;Son (2 sons) Type of Home: House Home Layout: One level Home Access: Stairs to enter [employer] of Steps: 3 Bathroom Shower/Tub: Engineer, manufacturing systems: Standard Home Adaptive Equipment: Bedside commode/3-in-1;Walker - rolling;Straight cane Prior Function Level of Independence: Independent with basic ADLs;Independent with transfers;Independent with homemaking with ambulation;Independent with gait Driving: Yes Vocation: Retired Producer, television/film/video: Awake/alert Overall Cognitive Status: Appears within functional limits for tasks assessed Sensation/Coordination   Extremity Assessment RLE Strength RLE Overall Strength Comments: grossly 4/5 LLE Strength LLE Overall Strength Comments: grossly 4/5 Mobility (including  Balance) Transfers Transfers: Yes Sit to Stand:  5: Supervision;From bed;With upper extremity assist Sit to Stand Details (indicate cue type and reason): No cues needed Stand to Sit: 5: Supervision;With upper extremity assist;With armrests;To chair/3-in-1 Stand to Sit Details: No cues needed Ambulation/Gait Ambulation/Gait: Yes Ambulation/Gait Assistance: 4: Min assist (min guard) Ambulation/Gait Assistance Details (indicate cue type and reason): slightly unsteady Ambulation Distance (Feet): 200 Feet Assistive device: None Gait Pattern:  (narrow base)  Static Standing Balance Static Standing - Balance Support: No upper extremity supported Static Standing - Level of Assistance: 5: Stand by assistance Dynamic Standing Balance Dynamic Standing - Balance Support: No upper extremity supported Dynamic Standing - Level of Assistance: 4: Min assist Exercise    End of Session PT - End of Session Activity Tolerance: Patient tolerated treatment well Patient left: in chair;with call bell in reach Nurse Communication: Mobility status for ambulation General Behavior During Session: [facility] for tasks performed Cognition: [facility] for tasks performed  [facility] 05/31/2011, 11:08 AM  [facility] PT [phone]

## 2011-05-31 NOTE — Progress Notes (Signed)
Called by RN to inform me that patient has elevated BP with SBP > 240.  After receiving 20mg  PO hydralazine and !0 mg IV. Ordered clonidine 0.2 now and increased to TID. Also increased patients scheduled Hydralazine to 50mg   Q6 h. po. Repeat SBP 216 per patient her BP rarely goes below 200.  Will repeat frequently and attempt to continue to lower down to 180 if possible. patient is asymptomatic will avoid drastic drop in pressure.   Chart including problem list, medications, labs and vitals were reviewed  Carmen [name] 7:40 PM

## 2011-06-01 LAB — BASIC METABOLIC PANEL
BUN: 10 mg/dL (ref 6–23)
Chloride: 106 mEq/L (ref 96–112)
Creatinine, Ser: 0.74 mg/dL (ref 0.50–1.10)
GFR calc Af Amer: >90 mL/min (ref >90)
GFR calc non Af Amer: 81 mL/min — ABNORMAL LOW (ref >90)
Potassium: 3.7 mEq/L (ref 3.5–5.1)

## 2011-06-01 LAB — CBC
HCT: 44.1 % (ref 36.0–46.0)
Hemoglobin: 15.4 g/dL — ABNORMAL HIGH (ref 12.0–15.0)
MCHC: 34.9 g/dL (ref 30.0–36.0)
RDW: 14.6 % (ref 11.5–15.5)
WBC: 6.7 10*3/uL (ref 4.0–10.5)

## 2011-06-01 LAB — MAGNESIUM: Magnesium: 1.6 mg/dL (ref 1.5–2.5)

## 2011-06-01 LAB — GLUCOSE, CAPILLARY

## 2011-06-01 MED ORDER — ISOSORBIDE MONONITRATE ER 60 MG PO TB24
60.0000 mg | ORAL_TABLET | Freq: Every day | ORAL | Status: DC
  Administered 2011-06-01: 60 mg via ORAL
  Filled 2011-06-01: qty 1

## 2011-06-01 MED ORDER — LOSARTAN POTASSIUM 50 MG PO TABS
100.0000 mg | ORAL_TABLET | Freq: Every day | ORAL | Status: DC
  Administered 2011-06-01: 100 mg via ORAL
  Filled 2011-06-01: qty 2

## 2011-06-01 MED ORDER — POTASSIUM CHLORIDE CRYS ER 20 MEQ PO TBCR: 20.0000 meq | EXTENDED_RELEASE_TABLET | Freq: Two times a day (BID) | ORAL | Status: DC

## 2011-06-01 MED ORDER — MAGNESIUM CHLORIDE 64 MG PO TBEC: 1.0000 | DELAYED_RELEASE_TABLET | Freq: Every day | ORAL | Status: DC

## 2011-06-01 MED ORDER — HYDRALAZINE HCL 50 MG PO TABS: 50.0000 mg | ORAL_TABLET | Freq: Four times a day (QID) | ORAL | Status: DC

## 2011-06-01 MED ORDER — LOSARTAN POTASSIUM 100 MG PO TABS: 100.0000 mg | ORAL_TABLET | Freq: Every day | ORAL | Status: DC

## 2011-06-01 MED ORDER — ISOSORBIDE MONONITRATE ER 60 MG PO TB24: 60.0000 mg | ORAL_TABLET | Freq: Every day | ORAL | Status: DC

## 2011-06-01 MED ORDER — FUROSEMIDE 40 MG PO TABS
40.0000 mg | ORAL_TABLET | Freq: Every day | ORAL | Status: DC
  Administered 2011-06-01: 40 mg via ORAL
  Filled 2011-06-01 (×2): qty 1

## 2011-06-01 NOTE — Progress Notes (Signed)
Physical Therapy Treatment Patient Details Name: Carmen [name] MRN: [id number] DOB: 02/06/1934 Today's Date: 06/01/2011  PT Assessment/Plan  PT - Assessment/Plan Comments on Treatment Session: Pt doing well with mobility.  Ready for DC home from PT standpoint. PT Plan: Discharge plan remains appropriate Follow Up Recommendations: No PT follow up Equipment Recommended: None recommended by PT PT Goals  Acute Rehab PT Goals PT Goal: Sit to Supine/Side - Progress: Met PT Goal: Sit to Stand - Progress: Met PT Goal: Stand to Sit - Progress: Met PT Goal: Ambulate - Progress: Met  PT Treatment Precautions/Restrictions  Precautions Precautions: Fall Restrictions Weight Bearing Restrictions: No Mobility (including Balance) Bed Mobility Bed Mobility: Yes Sit to Supine: 7: Independent;HOB flat Transfers Sit to Stand: 6: Modified independent (Device/Increase time);From bed;With upper extremity assist Sit to Stand Details (indicate cue type and reason): no cues needed Stand to Sit: 6: Modified independent (Device/Increase time);Without upper extremity assist;To bed Stand to Sit Details: no cues needed Ambulation/Gait Ambulation/Gait Assistance: 5: Supervision Ambulation Distance (Feet): 200 Feet Assistive device: None Gait Pattern:  (narrow base)  Static Standing Balance Static Standing - Balance Support: No upper extremity supported Static Standing - Level of Assistance: 6: Modified independent (Device/Increase time) Dynamic Standing Balance Dynamic Standing - Balance Support: No upper extremity supported Dynamic Standing - Level of Assistance: 5: Stand by assistance Exercise    End of Session PT - End of Session Activity Tolerance: Patient tolerated treatment well Patient left: in bed;with call bell in reach;with bed alarm set Nurse Communication: Mobility status for ambulation General Behavior During Session: [facility] for tasks performed Cognition: [facility] for tasks  performed  [facility] 06/01/2011, 10:49 AM  [facility] PT [phone]

## 2011-06-01 NOTE — Progress Notes (Signed)
   CARE MANAGEMENT NOTE 06/01/2011  Patient:  Carmen [name], Carmen [name]   Account Number:  [id number]  Date Initiated:  06/01/2011  Documentation initiated by:  Donn Pierini  Subjective/Objective Assessment:   Pt admitted with weakness     Action/Plan:   PTA pt lived at home with family   Anticipated DC Date:  06/01/2011   Anticipated DC Plan:  HOME/SELF CARE      DC Planning Services  CM consult      Choice offered to / List presented to:             Status of service:  Completed, signed off Medicare Important Message given?   (If response is "NO", the following Medicare IM given date fields will be blank) Date Medicare IM given:   Date Additional Medicare IM given:    Discharge Disposition:  HOME/SELF CARE  Per UR Regulation:  Reviewed for med. necessity/level of care/duration of stay  If discussed at Long Length of Stay Meetings, dates discussed:    Comments:  PCP- R. Sanders  06/01/11- 1100-  Donn Pierini RN, BSN [phone] Pt for discharge today back home with family, no d/c needs identified.

## 2011-06-01 NOTE — Discharge Summary (Signed)
Patient ID: Carmen [name] MRN: [id number] DOB/AGE: September 13, 1933 78 y.o. Primary Care Physician:SANDERS,ROBYN N, MD, MD Admit date: 05/29/2011 Discharge date: 06/09/2011    Discharge Diagnoses:   Principal Problem:  *Weakness generalized Active Problems:  Hypokalemia  HTN (hypertension), malignant  Tobacco abuse  DM (diabetes mellitus), type 2  Claudication in peripheral vascular disease  Renovascular hypertension  Hypomagnesemia   Medication List  As of 06/09/2011  4:43 PM   START taking these medications         magnesium chloride 64 MG Tbec   Commonly known as: SLOW-MAG   Take 1 tablet (64 mg total) by mouth daily.      potassium chloride SA 20 MEQ tablet   Commonly known as: K-DUR,KLOR-CON   Take 1 tablet (20 mEq total) by mouth 2 (two) times daily.         CHANGE how you take these medications         hydrALAZINE 50 MG tablet   Commonly known as: APRESOLINE   Take 1 tablet (50 mg total) by mouth 4 (four) times daily.   What changed: - medication strength - dose      isosorbide mononitrate 60 MG 24 hr tablet   Commonly known as: IMDUR   Take 1 tablet (60 mg total) by mouth daily.   What changed: - medication strength - dose      losartan 100 MG tablet   Commonly known as: COZAAR   Take 1 tablet (100 mg total) by mouth daily.   What changed: - medication strength - dose         CONTINUE taking these medications         ALPRAZolam 0.5 MG tablet   Commonly known as: XANAX      aspirin 325 MG tablet      cilostazol 100 MG tablet   Commonly known as: PLETAL      cloNIDine 0.2 MG tablet   Commonly known as: CATAPRES      furosemide 40 MG tablet   Commonly known as: LASIX      HYDROcodone-acetaminophen 5-500 MG per tablet   Commonly known as: VICODIN      insulin detemir 100 UNIT/ML injection   Commonly known as: LEVEMIR      metoprolol tartrate 25 MG tablet   Commonly known as: LOPRESSOR      rosuvastatin 10 MG tablet   Commonly known as:  CRESTOR          Where to get your medications    These are the prescriptions that you need to pick up.   You may get these medications from any pharmacy.         hydrALAZINE 50 MG tablet   isosorbide mononitrate 60 MG 24 hr tablet   losartan 100 MG tablet   magnesium chloride 64 MG Tbec   potassium chloride SA 20 MEQ tablet            Discharged Condition: Good    Consults: None  Significant Diagnostic Studies: Dg Chest Port 1 View  05/29/2011  *RADIOLOGY REPORT*  Clinical Data: Weakness.  Hypertensive.  Diabetic.  Smoker.  PORTABLE CHEST - 1 VIEW  Comparison: 03/28/2011 and 12/07/2007.  Findings: Granuloma peripheral aspect the left lung unchanged compared to 2009.  Right subclavian artery stent in place.  Cardiomegaly.  Pulmonary vascular prominence most notable centrally.  No frank pulmonary edema, segmental infiltrate or gross pneumothorax.  Calcified aorta.  IMPRESSION: Cardiomegaly.  Central pulmonary vascular prominence.  Original Report Authenticated By: Fuller Canada, M.D.    Lab Results: No results found for this or any previous visit (from the past 48 hour(s)). No results found for this or any previous visit (from the past 240 hour(s)).   Hospital Course:  78 year old woman with severe secondary hypertension was admitted to the hospital for increased weakness. it was noted that she was severely hypokalemic and hypomagnesemic. Her Lasix was held and she was started on intravenous and oral potassium supplements. She also received magnesium supplements. During the hospitalization the weakness improved and then we focused on optimizing blood pressure control by increasing the dose of hydralazine, Imdur, losartan. Patient remained asymptomatic in regards to her hypertension and she should followup with her primary cardiologist and her primary care physician for further medication adjustments.  Discharge Exam: Blood pressure 195/65, pulse 81, temperature 99.2 F (37.3  C), temperature source Oral, resp. rate 20, height 5\' 4"  (1.626 m), weight 65.1 kg (143 lb 8.3 oz), SpO2 93.00%.   Disposition: home  Discharge Orders    Future Orders Please Complete By Expires   Diet - low sodium heart healthy      Increase activity slowly         Follow-up Information    Schedule an appointment as soon as possible for a visit with Gwynneth Aliment, MD.         SignedLonia Blood 06/09/2011, 4:43 PM

## 2011-06-24 ENCOUNTER — Emergency Department (HOSPITAL_COMMUNITY): Payer: Medicare Other

## 2011-06-24 ENCOUNTER — Encounter (HOSPITAL_COMMUNITY): Payer: Self-pay | Admitting: *Deleted

## 2011-06-24 ENCOUNTER — Inpatient Hospital Stay (HOSPITAL_COMMUNITY)
Admission: EM | Admit: 2011-06-24 | Discharge: 2011-06-29 | DRG: 982 | Disposition: A | Discharge status: Home / Self Care | Payer: Medicare Other | Source: Ambulatory Visit | Attending: Internal Medicine | Admitting: Internal Medicine

## 2011-06-24 DIAGNOSIS — I10 Essential (primary) hypertension: Secondary | ICD-10-CM

## 2011-06-24 DIAGNOSIS — E119 Type 2 diabetes mellitus without complications: Secondary | ICD-10-CM

## 2011-06-24 DIAGNOSIS — I441 Atrioventricular block, second degree: Secondary | ICD-10-CM

## 2011-06-24 DIAGNOSIS — I498 Other specified cardiac arrhythmias: Secondary | ICD-10-CM | POA: Diagnosis present

## 2011-06-24 DIAGNOSIS — E162 Hypoglycemia, unspecified: Secondary | ICD-10-CM

## 2011-06-24 DIAGNOSIS — T68XXXA Hypothermia, initial encounter: Secondary | ICD-10-CM

## 2011-06-24 DIAGNOSIS — Z794 Long term (current) use of insulin: Secondary | ICD-10-CM

## 2011-06-24 DIAGNOSIS — R4182 Altered mental status, unspecified: Secondary | ICD-10-CM

## 2011-06-24 DIAGNOSIS — I739 Peripheral vascular disease, unspecified: Secondary | ICD-10-CM

## 2011-06-24 DIAGNOSIS — R531 Weakness: Secondary | ICD-10-CM

## 2011-06-24 DIAGNOSIS — I150 Renovascular hypertension: Secondary | ICD-10-CM

## 2011-06-24 DIAGNOSIS — Z720 Tobacco use: Secondary | ICD-10-CM

## 2011-06-24 DIAGNOSIS — G629 Polyneuropathy, unspecified: Secondary | ICD-10-CM

## 2011-06-24 DIAGNOSIS — IMO0002 Reserved for concepts with insufficient information to code with codable children: Principal | ICD-10-CM | POA: Diagnosis present

## 2011-06-24 DIAGNOSIS — E876 Hypokalemia: Secondary | ICD-10-CM

## 2011-06-24 DIAGNOSIS — Z7982 Long term (current) use of aspirin: Secondary | ICD-10-CM

## 2011-06-24 DIAGNOSIS — F17200 Nicotine dependence, unspecified, uncomplicated: Secondary | ICD-10-CM | POA: Diagnosis present

## 2011-06-24 DIAGNOSIS — Z8673 Personal history of transient ischemic attack (TIA), and cerebral infarction without residual deficits: Secondary | ICD-10-CM

## 2011-06-24 DIAGNOSIS — I959 Hypotension, unspecified: Secondary | ICD-10-CM | POA: Diagnosis present

## 2011-06-24 DIAGNOSIS — I5032 Chronic diastolic (congestive) heart failure: Secondary | ICD-10-CM | POA: Diagnosis present

## 2011-06-24 DIAGNOSIS — E785 Hyperlipidemia, unspecified: Secondary | ICD-10-CM | POA: Diagnosis present

## 2011-06-24 DIAGNOSIS — E1169 Type 2 diabetes mellitus with other specified complication: Principal | ICD-10-CM | POA: Diagnosis present

## 2011-06-24 DIAGNOSIS — I701 Atherosclerosis of renal artery: Secondary | ICD-10-CM | POA: Diagnosis present

## 2011-06-24 HISTORY — DX: Cerebral infarction, unspecified: I63.9

## 2011-06-24 HISTORY — DX: Anxiety disorder, unspecified: F41.9

## 2011-06-24 HISTORY — DX: Peripheral vascular disease, unspecified: I73.9

## 2011-06-24 LAB — URINALYSIS, ROUTINE W REFLEX MICROSCOPIC
Hgb urine dipstick: NEGATIVE
Nitrite: NEGATIVE
Protein, ur: 100 mg/dL — AB
Urobilinogen, UA: 0.2 mg/dL (ref 0.0–1.0)

## 2011-06-24 LAB — DIFFERENTIAL
Eosinophils Relative: 0 % (ref 0–5)
Lymphocytes Relative: 19 % (ref 12–46)
Lymphs Abs: 0.7 10*3/uL (ref 0.7–4.0)
Monocytes Absolute: 0.1 10*3/uL (ref 0.1–1.0)
Monocytes Relative: 3 % (ref 3–12)
Neutro Abs: 3 10*3/uL (ref 1.7–7.7)

## 2011-06-24 LAB — COMPREHENSIVE METABOLIC PANEL
AST: 36 U/L (ref 0–37)
BUN: 16 mg/dL (ref 6–23)
CO2: 20 mEq/L (ref 19–32)
Chloride: 110 mEq/L (ref 96–112)
Creatinine, Ser: 0.88 mg/dL (ref 0.50–1.10)
GFR calc Af Amer: 73 mL/min — ABNORMAL LOW (ref >90)
GFR calc non Af Amer: 63 mL/min — ABNORMAL LOW (ref >90)
Glucose, Bld: 50 mg/dL — ABNORMAL LOW (ref 70–99)
Total Bilirubin: 0.1 mg/dL — ABNORMAL LOW (ref 0.3–1.2)

## 2011-06-24 LAB — GLUCOSE, CAPILLARY
Glucose-Capillary: 60 mg/dL — ABNORMAL LOW (ref 70–99)
Glucose-Capillary: 130 mg/dL — ABNORMAL HIGH (ref 70–99)
Glucose-Capillary: 233 mg/dL — ABNORMAL HIGH (ref 70–99)
Glucose-Capillary: 275 mg/dL — ABNORMAL HIGH (ref 70–99)

## 2011-06-24 LAB — URINE MICROSCOPIC-ADD ON

## 2011-06-24 LAB — CBC
HCT: 43.5 % (ref 36.0–46.0)
Hemoglobin: 15.5 g/dL — ABNORMAL HIGH (ref 12.0–15.0)
MCV: 91.8 fL (ref 78.0–100.0)
WBC: 3.8 10*3/uL — ABNORMAL LOW (ref 4.0–10.5)

## 2011-06-24 LAB — LACTIC ACID, PLASMA: Lactic Acid, Venous: 1 mmol/L (ref 0.5–2.2)

## 2011-06-24 MED ORDER — METOPROLOL TARTRATE 25 MG PO TABS
ORAL_TABLET | ORAL | Status: AC
  Filled 2011-06-24: qty 1

## 2011-06-24 MED ORDER — ISOSORBIDE MONONITRATE ER 60 MG PO TB24
60.0000 mg | ORAL_TABLET | Freq: Every day | ORAL | Status: DC
  Administered 2011-06-24 – 2011-06-25 (×2): 60 mg via ORAL
  Filled 2011-06-24 (×2): qty 1

## 2011-06-24 MED ORDER — METOPROLOL TARTRATE 25 MG PO TABS
25.0000 mg | ORAL_TABLET | Freq: Two times a day (BID) | ORAL | Status: DC
  Administered 2011-06-24 – 2011-06-25 (×3): 25 mg via ORAL
  Filled 2011-06-24 (×3): qty 1

## 2011-06-24 MED ORDER — POTASSIUM CHLORIDE CRYS ER 20 MEQ PO TBCR
40.0000 meq | EXTENDED_RELEASE_TABLET | Freq: Once | ORAL | Status: AC
  Administered 2011-06-24: 40 meq via ORAL
  Filled 2011-06-24: qty 2

## 2011-06-24 MED ORDER — DEXTROSE-NACL 5-0.45 % IV SOLN
INTRAVENOUS | Status: DC
  Administered 2011-06-24: 75 mL/h via INTRAVENOUS

## 2011-06-24 MED ORDER — MAGNESIUM CHLORIDE 64 MG PO TBEC
1.0000 | DELAYED_RELEASE_TABLET | Freq: Every day | ORAL | Status: DC
Start: 2011-06-24 — End: 2011-06-29
  Administered 2011-06-24 – 2011-06-29 (×6): 64 mg via ORAL
  Filled 2011-06-24 (×6): qty 1

## 2011-06-24 MED ORDER — LOSARTAN POTASSIUM 50 MG PO TABS
100.0000 mg | ORAL_TABLET | Freq: Every day | ORAL | Status: DC
  Administered 2011-06-24 – 2011-06-25 (×2): 100 mg via ORAL
  Filled 2011-06-24 (×2): qty 2

## 2011-06-24 MED ORDER — GLUCOSE 40 % PO GEL
ORAL | Status: AC
  Administered 2011-06-24: 37.5 g
  Filled 2011-06-24: qty 1

## 2011-06-24 MED ORDER — INSULIN ASPART 100 UNIT/ML ~~LOC~~ SOLN
0.0000 [IU] | Freq: Three times a day (TID) | SUBCUTANEOUS | Status: DC
  Administered 2011-06-25: 2 [IU] via SUBCUTANEOUS
  Administered 2011-06-25: 1 [IU] via SUBCUTANEOUS

## 2011-06-24 MED ORDER — DEXTROSE 50 % IV SOLN
INTRAVENOUS | Status: AC
  Administered 2011-06-24: 07:00:00
  Filled 2011-06-24: qty 50

## 2011-06-24 MED ORDER — HYDRALAZINE HCL 50 MG PO TABS
50.0000 mg | ORAL_TABLET | Freq: Four times a day (QID) | ORAL | Status: DC
  Filled 2011-06-24 (×3): qty 1

## 2011-06-24 MED ORDER — ATORVASTATIN CALCIUM 20 MG PO TABS
20.0000 mg | ORAL_TABLET | Freq: Every day | ORAL | Status: DC
  Administered 2011-06-25 – 2011-06-28 (×4): 20 mg via ORAL
  Filled 2011-06-24 (×6): qty 1

## 2011-06-24 MED ORDER — HYDRALAZINE HCL 20 MG/ML IJ SOLN
5.0000 mg | Freq: Four times a day (QID) | INTRAMUSCULAR | Status: DC | PRN
  Filled 2011-06-24: qty 0.25

## 2011-06-24 MED ORDER — CLONIDINE HCL 0.2 MG PO TABS
0.2000 mg | ORAL_TABLET | Freq: Two times a day (BID) | ORAL | Status: DC
  Administered 2011-06-24 (×2): 0.2 mg via ORAL
  Filled 2011-06-24 (×3): qty 1

## 2011-06-24 MED ORDER — HYDRALAZINE HCL 50 MG PO TABS
50.0000 mg | ORAL_TABLET | Freq: Four times a day (QID) | ORAL | Status: DC
  Administered 2011-06-24 – 2011-06-25 (×3): 50 mg via ORAL
  Filled 2011-06-24 (×6): qty 1

## 2011-06-24 MED ORDER — SODIUM CHLORIDE 4 MEQ/ML IV SOLN
INTRAVENOUS | Status: DC
  Administered 2011-06-24: 09:00:00 via INTRAVENOUS
  Filled 2011-06-24 (×3): qty 1000

## 2011-06-24 MED ORDER — HYDROCODONE-ACETAMINOPHEN 5-325 MG PO TABS
1.0000 | ORAL_TABLET | ORAL | Status: DC | PRN
  Administered 2011-06-24 – 2011-06-27 (×5): 1 via ORAL
  Administered 2011-06-28: 0.5 via ORAL
  Administered 2011-06-28: 1 via ORAL
  Administered 2011-06-29: 0.5 via ORAL
  Administered 2011-06-29: 1 via ORAL
  Filled 2011-06-24 (×9): qty 1

## 2011-06-24 MED ORDER — FUROSEMIDE 40 MG PO TABS
40.0000 mg | ORAL_TABLET | Freq: Every day | ORAL | Status: DC
  Administered 2011-06-24 – 2011-06-25 (×2): 40 mg via ORAL
  Filled 2011-06-24 (×3): qty 1

## 2011-06-24 MED ORDER — SODIUM CHLORIDE 0.45 % IV SOLN
INTRAVENOUS | Status: DC
  Administered 2011-06-24 – 2011-06-26 (×4): via INTRAVENOUS

## 2011-06-24 MED ORDER — CILOSTAZOL 100 MG PO TABS
100.0000 mg | ORAL_TABLET | Freq: Two times a day (BID) | ORAL | Status: DC
  Administered 2011-06-24 – 2011-06-29 (×11): 100 mg via ORAL
  Filled 2011-06-24 (×12): qty 1

## 2011-06-24 MED ORDER — ASPIRIN 325 MG PO TABS
325.0000 mg | ORAL_TABLET | Freq: Every day | ORAL | Status: DC
  Administered 2011-06-24 – 2011-06-29 (×6): 325 mg via ORAL
  Filled 2011-06-24 (×6): qty 1

## 2011-06-24 MED ORDER — SODIUM CHLORIDE 0.9 % IJ SOLN
3.0000 mL | Freq: Two times a day (BID) | INTRAMUSCULAR | Status: DC
  Administered 2011-06-24 – 2011-06-29 (×7): 3 mL via INTRAVENOUS

## 2011-06-24 MED ORDER — DEXTROSE-NACL 5-0.45 % IV SOLN
INTRAVENOUS | Status: DC
  Administered 2011-06-24: 07:00:00 via INTRAVENOUS

## 2011-06-24 MED ORDER — CLONIDINE HCL 0.1 MG PO TABS
ORAL_TABLET | ORAL | Status: AC
  Filled 2011-06-24: qty 2

## 2011-06-24 MED ORDER — ALPRAZOLAM 0.5 MG PO TABS
0.5000 mg | ORAL_TABLET | Freq: Every day | ORAL | Status: DC | PRN
  Administered 2011-06-24: 0.5 mg via ORAL
  Filled 2011-06-24: qty 1

## 2011-06-24 MED ORDER — HYDROCODONE-ACETAMINOPHEN 5-325 MG PO TABS
1.0000 | ORAL_TABLET | Freq: Once | ORAL | Status: AC
  Administered 2011-06-24: 1 via ORAL
  Filled 2011-06-24: qty 1

## 2011-06-24 MED ORDER — ACETAMINOPHEN 325 MG PO TABS: 650.0000 mg | ORAL_TABLET | Freq: Four times a day (QID) | ORAL | Status: DC | PRN

## 2011-06-24 MED ORDER — ACETAMINOPHEN 650 MG RE SUPP: 650.0000 mg | Freq: Four times a day (QID) | RECTAL | Status: DC | PRN

## 2011-06-24 MED ORDER — ENOXAPARIN SODIUM 40 MG/0.4ML ~~LOC~~ SOLN
40.0000 mg | SUBCUTANEOUS | Status: DC
  Administered 2011-06-24 – 2011-06-27 (×4): 40 mg via SUBCUTANEOUS
  Filled 2011-06-24 (×7): qty 0.4

## 2011-06-24 NOTE — ED Notes (Addendum)
Denies sx at this time, (denies: sob, nausea, blurry vision, pain, dizziness or other sx). Family x2 at [facility]. abd soft NT, MAEx4.  D5 1/2 NS infusing on pump, given PB and milk, bare hugger in place.

## 2011-06-24 NOTE — ED Provider Notes (Signed)
History     CSN: [id number]  Arrival date & time 06/24/11  0620   First MD Initiated Contact with Patient 06/24/11 [id number]      Chief Complaint  Patient presents with  . Hypoglycemia    Initial at home 29, arrival to dept 15    (Consider location/radiation/quality/duration/timing/severity/associated sxs/prior treatment) Patient is a 78 y.o. female presenting with altered mental status. The history is provided by the patient, the EMS personnel and a relative.  Altered Mental Status This is a new problem. The current episode started today. The problem has been rapidly improving. Associated symptoms include weakness. Pertinent negatives include no abdominal pain, chest pain, chills, congestion, coughing, fever, headaches, myalgias, nausea, neck pain, sore throat or vomiting.  Per EMS and family, pt was hard to arouse this morning. She was "snoring and wouldn't wake up."  Pt's blood sugar at home was found to be 29, she was transported here by EMS. D50 given by EMS. On arrival pt's glu is 60. She is awake, able to provide some history. States she took her lantus last night as she normally does.  She denies any current complaints. Per family, history of the same in the last month, hospitalized just few weeks ago for the same. Also noted, pt has slurred speech. Last seen normal last night.  Past Medical History  Diagnosis Date  . Diabetes mellitus   . Hypertension   . Coronary artery disease   . Renal disorder   . Herniated lumbar intervertebral disc   . Cataract     cataract surgery scheduled for 03/31/11, 2 - left eye, 1 - right eye  . Renovascular hypertension      s/p left renal artery stent 12/2007.   S/P balloon angioplasty on 02/16/10 for ISR, BP was  controlled well since then.     . Claudication in peripheral vascular disease     02/16/10: Left CIA 9.0x28 Omnilink and REIA 8.0x40 seff expanding Zilver.   right subclavian artery stent 03/18/2008,  . S/P carotid endarterectomy    left carotid endarterectomy  1991; Stroke and TIA in 1999 with right sided weakness, now with residual right arm weakness    Past Surgical History  Procedure Date  . Appendectomy   . Abdominal hysterectomy   . Ureteral stent placement   . Carotid endarterectomy   . Laminectomy     No family history on file.  History  Substance Use Topics  . Smoking status: Current Everyday Smoker -- 0.5 packs/day  . Smokeless tobacco: Never Used  . Alcohol Use: No    OB History    Grav Para Term Preterm Abortions TAB SAB Ect Mult Living                  Review of Systems  Constitutional: Negative for fever and chills.  HENT: Negative for congestion, sore throat, neck pain and neck stiffness.   Eyes: Negative.   Respiratory: Negative for cough, chest tightness and shortness of breath.   Cardiovascular: Negative for chest pain and leg swelling.  Gastrointestinal: Negative for nausea, vomiting and abdominal pain.  Genitourinary: Negative.   Musculoskeletal: Negative.  Negative for myalgias.  Skin: Negative.   Neurological: Positive for weakness. Negative for headaches.  Psychiatric/Behavioral: Positive for altered mental status.    Allergies  Norvasc  Home Medications   Current Outpatient Rx  Name Route Sig Dispense Refill  . ALPRAZOLAM 0.5 MG PO TABS Oral Take 0.5 mg by mouth daily as needed. For anxiety    .  ASPIRIN 325 MG PO TABS Oral Take 325 mg by mouth daily.    Marland Kitchen CILOSTAZOL 100 MG PO TABS Oral Take 100 mg by mouth 2 (two) times daily.    Marland Kitchen CLONIDINE HCL 0.2 MG PO TABS Oral Take 0.2 mg by mouth 2 (two) times daily.    . FUROSEMIDE 40 MG PO TABS Oral Take 40 mg by mouth daily.    Marland Kitchen HYDRALAZINE HCL 50 MG PO TABS Oral Take 1 tablet (50 mg total) by mouth 4 (four) times daily. 120 tablet 0  . HYDROCODONE-ACETAMINOPHEN 5-500 MG PO TABS Oral Take 1 tablet by mouth every 4 (four) hours as needed. For pain.    . INSULIN DETEMIR 100 UNIT/ML [place] SOLN Subcutaneous Inject 5 Units into the  skin 2 (two) times daily.    . ISOSORBIDE MONONITRATE ER 60 MG PO TB24 Oral Take 1 tablet (60 mg total) by mouth daily. 30 tablet 0  . LOSARTAN POTASSIUM 100 MG PO TABS Oral Take 1 tablet (100 mg total) by mouth daily. 30 tablet 0  . MAGNESIUM CHLORIDE 64 MG PO TBEC Oral Take 1 tablet (64 mg total) by mouth daily. 60 tablet 0  . METOPROLOL TARTRATE 25 MG PO TABS Oral Take 25 mg by mouth 2 (two) times daily.    Marland Kitchen POTASSIUM CHLORIDE CRYS ER 20 MEQ PO TBCR Oral Take 1 tablet (20 mEq total) by mouth 2 (two) times daily. 60 tablet 0  . ROSUVASTATIN CALCIUM 10 MG PO TABS Oral Take 10 mg by mouth daily.      BP 209/73  Pulse 100  Temp(Src) 93.9 F (34.4 C) (Rectal)  Resp 16  SpO2 100%  Physical Exam  Nursing note and vitals reviewed. Constitutional: She is oriented to person, place, and time. She appears well-developed and well-nourished. No distress.  HENT:  Head: Normocephalic and atraumatic.  Eyes: Conjunctivae and EOM are normal. Pupils are equal, round, and reactive to light.  Neck: Normal range of motion. Neck supple.  Cardiovascular: Normal rate, regular rhythm and normal heart sounds.   Pulmonary/Chest: Effort normal and breath sounds normal. No respiratory distress. She has no wheezes. She has no rales.  Abdominal: Soft. Bowel sounds are normal. She exhibits no distension. There is no tenderness.  Musculoskeletal: She exhibits no edema.  Neurological: She is alert and oriented to person, place, and time.       Chronic right UE weakness, grip strength strong bilaterally  Left stronger than right.  Skin: Skin is dry. No rash noted. No erythema.  Psychiatric: She has a normal mood and affect.    ED Course  Procedures (including critical care time)  Pt is hypoglycemic on arrival at 60. She is alert and oriented x3. Family by bedside. Will try glutose and D50. She is hypothermic with temp of 93.9 rectally. Bair hugger applied. Labs, CT head pending.   Results for orders placed  during the hospital encounter of 06/24/11  CBC      Component Value Range   WBC 3.8 (*) 4.0 - 10.5 (K/uL)   RBC 4.74  3.87 - 5.11 (MIL/uL)   Hemoglobin 15.5 (*) 12.0 - 15.0 (g/dL)   HCT 16.1  09.6 - 04.5 (%)   MCV 91.8  78.0 - 100.0 (fL)   MCH 32.7  26.0 - 34.0 (pg)   MCHC 35.6  30.0 - 36.0 (g/dL)   RDW 40.9  81.1 - 91.4 (%)   Platelets 192  150 - 400 (K/uL)  DIFFERENTIAL  Component Value Range   Neutrophils Relative 78 (*) 43 - 77 (%)   Neutro Abs 3.0  1.7 - 7.7 (K/uL)   Lymphocytes Relative 19  12 - 46 (%)   Lymphs Abs 0.7  0.7 - 4.0 (K/uL)   Monocytes Relative 3  3 - 12 (%)   Monocytes Absolute 0.1  0.1 - 1.0 (K/uL)   Eosinophils Relative 0  0 - 5 (%)   Eosinophils Absolute 0.0  0.0 - 0.7 (K/uL)   Basophils Relative 0  0 - 1 (%)   Basophils Absolute 0.0  0.0 - 0.1 (K/uL)  COMPREHENSIVE METABOLIC PANEL      Component Value Range   Sodium 136  135 - 145 (mEq/L)   Potassium 3.5  3.5 - 5.1 (mEq/L)   Chloride 110  96 - 112 (mEq/L)   CO2 20  19 - 32 (mEq/L)   Glucose, Bld 50 (*) 70 - 99 (mg/dL)   BUN 16  6 - 23 (mg/dL)   Creatinine, Ser 4.09  0.50 - 1.10 (mg/dL)   Calcium 9.1  8.4 - 81.1 (mg/dL)   Total Protein 6.1  6.0 - 8.3 (g/dL)   Albumin 2.9 (*) 3.5 - 5.2 (g/dL)   AST 36  0 - 37 (U/L)   ALT 28  0 - 35 (U/L)   Alkaline Phosphatase 103  39 - 117 (U/L)   Total Bilirubin 0.1 (*) 0.3 - 1.2 (mg/dL)   GFR calc non Af Amer 63 (*) >90 (mL/min)   GFR calc Af Amer 73 (*) >90 (mL/min)  LACTIC ACID, PLASMA      Component Value Range   Lactic Acid, Venous 1.0  0.5 - 2.2 (mmol/L)  MAGNESIUM      Component Value Range   Magnesium 1.9  1.5 - 2.5 (mg/dL)  GLUCOSE, CAPILLARY      Component Value Range   Glucose-Capillary 60 (*) 70 - 99 (mg/dL)  POCT I-STAT TROPONIN I      Component Value Range   Troponin i, poc 0.03  0.00 - 0.08 (ng/mL)   Comment 3           GLUCOSE, CAPILLARY      Component Value Range   Glucose-Capillary 57 (*) 70 - 99 (mg/dL)  GLUCOSE, CAPILLARY       Component Value Range   Glucose-Capillary 130 (*) 70 - 99 (mg/dL)   Comment 1 Notify RN     Comment 2 Documented in Chart    GLUCOSE, CAPILLARY      Component Value Range   Glucose-Capillary 231 (*) 70 - 99 (mg/dL)   Comment 1 Documented in Chart    GLUCOSE, CAPILLARY      Component Value Range   Glucose-Capillary 233 (*) 70 - 99 (mg/dL)   Comment 1 Notify RN     Dg Chest 2 View  06/24/2011  *RADIOLOGY REPORT*  Clinical Data: Hypoglycemia, weakness, previous stroke  CHEST - 2 VIEW  Comparison: Portable chest x-ray of 05/29/2011  Findings: There is cardiomegaly present. On the lateral view small pleural effusions are present most consistent with mild congestive heart failure.  No focal infiltrate is seen.  No bony abnormality is seen other than diffuse osteopenia.  IMPRESSION:   Probable mild CHF with cardiomegaly and small effusions.  Original Report Authenticated By: Juline Patch, M.D.   Just found out pt was given lantus this morning after ger CBG check was 35. Will start on D10% 1/2ns, CBGs Qhour. will admit.   Spoke with  [facility] will admit.       1. Hypoglycemia   2. Hypothermia   3. Altered mental state       MDM          Lottie Mussel, PA 06/24/11 1623

## 2011-06-24 NOTE — ED Notes (Signed)
Meal tray ordered for patient.

## 2011-06-24 NOTE — Progress Notes (Signed)
Utilization review complete 

## 2011-06-24 NOTE — ED Notes (Signed)
Notified RN of CBG 233

## 2011-06-24 NOTE — ED Notes (Signed)
To ED via EMS from home for eval of hypoglycemia event. At home pt initial CBG 29, highest of 64, last EMS CBG 57 at ED arrival. Per family pt is lethargic and speech. Pt answers questions and follows commands approp. Baseline of walking and talking, independent with mild assistance. VS P 100, 182/104. ED CBG 60

## 2011-06-24 NOTE — H&P (Signed)
Hospital Admission Note Date: 06/24/2011  Patient name: Carmen [name]           Medical record number: [id number] Date of birth: Jul 24, 1933           Age: 78 y.o.   Gender: female    PCP:   Gwynneth Aliment, MD, MD   Chief Complaint:  Hypoglycemia  HPI: Carmen [name] is a 78 y.o. female with past medical history of renovascular hypertension, diabetes and peripheral vascular diseases. Patient came to the hospital because of lethargy and confusion. Patient reported by family she was hard to arouse this morning and she was not waking up. They checked her blood sugar it was around 29. They called 911, and EMS reported blood sugar of around 60, patient family mentioned that she took her 5 units of levimer insulin this morning. Patient placed on D5W, her blood sugar was lingering around 60 so that was switched to D10 with half-normal saline. Upon initial evaluation emergency department found to have blood pressure of 200/100. Patient already scheduled for replacement of her renal stent on 07/06/2011  Past Medical History: Past Medical History  Diagnosis Date  . Diabetes mellitus   . Hypertension   . Coronary artery disease   . Renal disorder   . Herniated lumbar intervertebral disc   . Cataract     cataract surgery scheduled for 03/31/11, 2 - left eye, 1 - right eye  . Renovascular hypertension      s/p left renal artery stent 12/2007.   S/P balloon angioplasty on 02/16/10 for ISR, BP was  controlled well since then.     . Claudication in peripheral vascular disease     02/16/10: Left CIA 9.0x28 Omnilink and REIA 8.0x40 seff expanding Zilver.   right subclavian artery stent 03/18/2008,  . S/P carotid endarterectomy      left carotid endarterectomy  1991; Stroke and TIA in 1999 with right sided weakness, now with residual right arm weakness  . Stroke     TIA  . Peripheral vascular disease   . Anxiety    Past Surgical History  Procedure Date  . Appendectomy   . Abdominal hysterectomy     . Ureteral stent placement   . Carotid endarterectomy   . Laminectomy   . Back surgery     Medications: Prior to Admission medications   Medication Sig Start Date End Date Taking? Authorizing Provider  ALPRAZolam Prudy Feeler) 0.5 MG tablet Take 0.5 mg by mouth daily as needed. For anxiety   Yes Historical Provider, MD  aspirin 325 MG tablet Take 325 mg by mouth daily.   Yes Historical Provider, MD  cilostazol (PLETAL) 100 MG tablet Take 100 mg by mouth 2 (two) times daily.   Yes Historical Provider, MD  cloNIDine (CATAPRES) 0.2 MG tablet Take 0.2 mg by mouth 2 (two) times daily. 04/06/11  Yes Rhetta Mura, MD  furosemide (LASIX) 40 MG tablet Take 40 mg by mouth daily. 04/06/11 04/05/12 Yes Rhetta Mura, MD  hydrALAZINE (APRESOLINE) 50 MG tablet Take 1 tablet (50 mg total) by mouth 4 (four) times daily. 06/01/11 05/31/12 Yes Sorin Luanne Bras, MD  insulin detemir (LEVEMIR) 100 UNIT/ML injection Inject 5 Units into the skin 2 (two) times daily.   Yes Historical Provider, MD  isosorbide mononitrate (IMDUR) 60 MG 24 hr tablet Take 1 tablet (60 mg total) by mouth daily. 06/01/11 05/31/12 Yes Sorin Luanne Bras, MD  losartan (COZAAR) 100 MG tablet Take 1 tablet (100 mg total) by mouth  daily. 06/01/11 05/31/12 Yes Sorin Luanne Bras, MD  magnesium chloride (SLOW-MAG) 64 MG TBEC Take 1 tablet (64 mg total) by mouth daily. 06/01/11  Yes Sorin Luanne Bras, MD  metoprolol tartrate (LOPRESSOR) 25 MG tablet Take 25 mg by mouth 2 (two) times daily. 04/06/11 04/05/12 Yes Rhetta Mura, MD  potassium chloride SA (K-DUR,KLOR-CON) 20 MEQ tablet Take 1 tablet (20 mEq total) by mouth 2 (two) times daily. 06/01/11 05/31/12 Yes Sorin Luanne Bras, MD  rosuvastatin (CRESTOR) 10 MG tablet Take 10 mg by mouth daily.   Yes Historical Provider, MD    Allergies:   Allergies  Allergen Reactions  . Norvasc (Amlodipine Besylate) Swelling    Social History:  reports that she has been smoking Cigarettes.  She has a 32.5 pack-year smoking  history. She has never used smokeless tobacco. She reports that she does not drink alcohol or use illicit drugs.  Family History: History reviewed. No pertinent family history.  Review of Systems:  Constitutional: negative for anorexia, fevers and sweats Eyes: negative for irritation, redness and visual disturbance Ears, nose, mouth, throat, and face: negative for earaches, epistaxis, nasal congestion and sore throat Respiratory: negative for cough, dyspnea on exertion, sputum and wheezing Cardiovascular: negative for chest pain, dyspnea, lower extremity edema, orthopnea, palpitations and syncope Gastrointestinal: negative for abdominal pain, constipation, diarrhea, melena, nausea and vomiting Genitourinary:negative for dysuria, frequency and hematuria Hematologic/lymphatic: negative for bleeding, easy bruising and lymphadenopathy Musculoskeletal:negative for arthralgias, muscle weakness and stiff joints Neurological: negative for coordination problems, gait problems, headaches and weakness Endocrine: negative for diabetic symptoms including polydipsia, polyuria and weight loss Allergic/Immunologic: negative for anaphylaxis, hay fever and urticaria  Physical Exam: BP 186/88  Pulse 81  Temp(Src) 97.6 F (36.4 C) (Oral)  Resp 17  SpO2 100% General appearance: alert, cooperative and no distress  Head: Normocephalic, without obvious abnormality, atraumatic  Eyes: conjunctivae/corneas clear. PERRL, EOM's intact. Fundi benign.  Nose: Nares normal. Septum midline. Mucosa normal. No drainage or sinus tenderness.  Throat: lips, mucosa, and tongue normal; teeth and gums normal  Neck: Supple, no masses, no cervical lymphadenopathy, no JVD appreciated, no meningeal signs Resp: clear to auscultation bilaterally  Chest wall: no tenderness  Cardio: regular rate and rhythm, S1, S2 normal, no murmur, click, rub or gallop  GI: soft, non-tender; bowel sounds normal; no masses, no organomegaly    Extremities: extremities normal, atraumatic, no cyanosis or edema  Skin: Skin color, texture, turgor normal. No rashes or lesions  Neurologic: Alert and oriented X 3, normal strength and tone. Normal symmetric reflexes. Normal coordination and gait  Labs on Admission:   [facility] 06/24/11 0638  NA 136  K 3.5  CL 110  CO2 20  GLUCOSE 50*  BUN 16  CREATININE 0.88  CALCIUM 9.1  MG 1.9  PHOS --    Basename 06/24/11 0638  AST 36  ALT 28  ALKPHOS 103  BILITOT 0.1*  PROT 6.1  ALBUMIN 2.9*   No results found for this basename: LIPASE:2,AMYLASE:2 in the last 72 hours  Basename 06/24/11 0638  WBC 3.8*  NEUTROABS 3.0  HGB 15.5*  HCT 43.5  MCV 91.8  PLT 192   Radiological Exams on Admission: Dg Chest 2 View  06/24/2011  *RADIOLOGY REPORT*  Clinical Data: Hypoglycemia, weakness, previous stroke  CHEST - 2 VIEW  Comparison: Portable chest x-ray of 05/29/2011  Findings: There is cardiomegaly present. On the lateral view small pleural effusions are present most consistent with mild congestive heart failure.  No focal infiltrate  is seen.  No bony abnormality is seen other than diffuse osteopenia.  IMPRESSION:   Probable mild CHF with cardiomegaly and small effusions.  Original Report Authenticated By: Juline Patch, M.D.   Dg Chest Port 1 View  05/29/2011  *RADIOLOGY REPORT*  Clinical Data: Weakness.  Hypertensive.  Diabetic.  Smoker.  PORTABLE CHEST - 1 VIEW  Comparison: 03/28/2011 and 12/07/2007.  Findings: Granuloma peripheral aspect the left lung unchanged compared to 2009.  Right subclavian artery stent in place.  Cardiomegaly.  Pulmonary vascular prominence most notable centrally.  No frank pulmonary edema, segmental infiltrate or gross pneumothorax.  Calcified aorta.  IMPRESSION: Cardiomegaly.  Central pulmonary vascular prominence.  Original Report Authenticated By: Fuller Canada, M.D.     IMPRESSION: Present on Admission:  .Hypoglycemia .Renovascular  hypertension .Accelerated hypertension .DM (diabetes mellitus), type 2  Assessment/Plan  Hypoglycemia Secondary to long-acting insulin, levimer. Patient mentioned that her dose was recently decreased from 20 in the morning and 10 in the evening to 5 units twice a day. Patient is on the low blood sugar likely along with poor oral intake. No evidence of infection chest x-ray showed mild congestion but no pneumonia, I will check urinalysis. Patient currently on D10, I will check her blood sugar every hour try to wean her off the D10 infusion  Accelerated hypertension Patient reported to have renovascular hypertension with left renal artery stent, patient supposed to have the stent changed on 07/06/2011 by Dr. Jacinto Halim. I will notify Dr. Jacinto Halim about patient presents here. And get last set of medications patient is taking. I will utilize IV hydralazine.  Diabetes mellitus type 2 Uncontrolled diabetes mellitus with hemoglobin A1c of 13.2 on 05/30/2011. Patient came in with hypoglycemia so she is off of her insulin. Patient might not even need insulin the time of discharge.   Peripheral vascular disease This is been stable. Continue preadmission medications  Kasper Mudrick A 06/24/2011, 10:11 AM

## 2011-06-24 NOTE — ED Notes (Signed)
Patient stated she did not need to urinate at this time.  Will check back later.

## 2011-06-24 NOTE — Progress Notes (Signed)
Pt admitted to room 4736. Pt placed on tele and oriented to room.  Pts BP was 126/42.  Dr. Arthor Captain was notified and said to continue to monitor and give pt the rest of BP meds that she didn't receive in ED.  Will continue to monitor.

## 2011-06-24 NOTE — ED Notes (Signed)
Patient CBG done at 0634, CBG 60

## 2011-06-24 NOTE — ED Notes (Signed)
CBG of 130 after starting D10 and giving apple juice with sugar in it.

## 2011-06-24 NOTE — ED Notes (Signed)
60 was cbg

## 2011-06-25 LAB — GLUCOSE, CAPILLARY
Glucose-Capillary: 40 mg/dL — CL (ref 70–99)
Glucose-Capillary: 53 mg/dL — ABNORMAL LOW (ref 70–99)
Glucose-Capillary: 110 mg/dL — ABNORMAL HIGH (ref 70–99)

## 2011-06-25 LAB — HEMOGLOBIN A1C: Mean Plasma Glucose: 286 mg/dL — ABNORMAL HIGH (ref <117)

## 2011-06-25 MED ORDER — HYDRALAZINE HCL 50 MG PO TABS: 50.0000 mg | ORAL_TABLET | Freq: Four times a day (QID) | ORAL | Status: DC

## 2011-06-25 MED ORDER — HYDRALAZINE HCL 20 MG/ML IJ SOLN
10.0000 mg | Freq: Four times a day (QID) | INTRAMUSCULAR | Status: DC | PRN
  Administered 2011-06-26 – 2011-06-29 (×3): 10 mg via INTRAVENOUS
  Filled 2011-06-25 (×2): qty 0.5

## 2011-06-25 MED ORDER — CLONIDINE HCL 0.2 MG PO TABS
0.2000 mg | ORAL_TABLET | Freq: Three times a day (TID) | ORAL | Status: DC
  Administered 2011-06-25: 0.2 mg via ORAL
  Filled 2011-06-25 (×3): qty 1

## 2011-06-25 MED ORDER — INSULIN ASPART 100 UNIT/ML ~~LOC~~ SOLN
0.0000 [IU] | Freq: Three times a day (TID) | SUBCUTANEOUS | Status: DC
  Administered 2011-06-26: 5 [IU] via SUBCUTANEOUS
  Administered 2011-06-26: 2 [IU] via SUBCUTANEOUS
  Administered 2011-06-26 – 2011-06-27 (×2): 3 [IU] via SUBCUTANEOUS
  Administered 2011-06-27: 2 [IU] via SUBCUTANEOUS
  Administered 2011-06-27: 3 [IU] via SUBCUTANEOUS
  Administered 2011-06-29: 2 [IU] via SUBCUTANEOUS

## 2011-06-25 MED ORDER — INSULIN ASPART 100 UNIT/ML ~~LOC~~ SOLN
0.0000 [IU] | Freq: Every day | SUBCUTANEOUS | Status: DC
  Administered 2011-06-28: 3 [IU] via SUBCUTANEOUS

## 2011-06-25 MED ORDER — SODIUM CHLORIDE 0.9 % IV BOLUS (SEPSIS)
1000.0000 mL | Freq: Once | INTRAVENOUS | Status: AC
  Administered 2011-06-25: 1000 mL via INTRAVENOUS

## 2011-06-25 NOTE — ED Provider Notes (Signed)
Medical screening examination/treatment/procedure(s) were conducted as a shared visit with non-physician practitioner(s) and myself.  I personally evaluated the patient during the encounter  Hypoglycemia, hypothermia, uncontrolled HTN 2/2 renal artery stenosis.  Generalized weakness with poor pO intake.  CTAB, RRR  CRITICAL CARE Performed by: Glynn Octave   Total critical care time: 30  Critical care time was exclusive of separately billable procedures and treating other patients.  Critical care was necessary to treat or prevent imminent or life-threatening deterioration.  Critical care was time spent personally by me on the following activities: development of treatment plan with patient and/or surrogate as well as nursing, discussions with consultants, evaluation of patient's response to treatment, examination of patient, obtaining history from patient or surrogate, ordering and performing treatments and interventions, ordering and review of laboratory studies, ordering and review of radiographic studies, pulse oximetry and re-evaluation of patient's condition.30  Glynn Octave, MD 06/25/11 1536

## 2011-06-25 NOTE — Progress Notes (Signed)
Patient ID: Carmen [name]  female  ZOX:[id number]    DOB: 1933/10/17    DOA: 06/24/2011  PCP: Gwynneth Aliment, MD, MD  Subjective: Ambulating in the room, without difficulty, BP initially noted to be elevated, after receiving BP meds became hypotensive  Objective: Weight change:   Intake/Output Summary (Last 24 hours) at 06/25/11 1639 Last data filed at 06/25/11 1400  Gross per 24 hour  Intake   3105 ml  Output   2326 ml  Net    779 ml   Blood pressure 86/42, pulse 56, temperature 98.6 F (37 C), temperature source Oral, resp. rate 18, height 5\' 4"  (1.626 m), weight 56.9 kg (125 lb 7.1 oz), SpO2 95.00%.  Physical Exam: General: Alert and awake, oriented x3, not in any acute distress. HEENT: anicteric sclera, pupils reactive to light and accommodation, EOMI CVS: S1-S2 clear, no murmur rubs or gallops Chest: clear to auscultation bilaterally, no wheezing, rales or rhonchi Abdomen: soft nontender, nondistended, normal bowel sounds, no organomegaly Extremities: no cyanosis, clubbing or edema noted bilaterally Neuro: Cranial nerves II-XII intact, no focal neurological deficits  Lab Results: Basic Metabolic Panel:  Lab 06/24/11 8119  NA 136  K 3.5  CL 110  CO2 20  GLUCOSE 50*  BUN 16  CREATININE 0.88  CALCIUM 9.1  MG 1.9  PHOS --   Liver Function Tests:  Lab 06/24/11 0638  AST 36  ALT 28  ALKPHOS 103  BILITOT 0.1*  PROT 6.1  ALBUMIN 2.9*   CBC:  Lab 06/24/11 0638  WBC 3.8*  NEUTROABS 3.0  HGB 15.5*  HCT 43.5  MCV 91.8  PLT 192   CBG:  Lab 06/25/11 1112 06/24/11 1611 06/24/11 1319 06/24/11 1057 06/24/11 0911  GLUCAP 199* 275* 231* 233* 130*     Micro Results: Recent Results (from the past 240 hour(s))  CULTURE, BLOOD (ROUTINE X 2)     Status: Normal (Preliminary result)   Collection Time   06/24/11  6:30 AM      Component Value Range Status Comment   Specimen Description BLOOD RIGHT ARM   Final    Special Requests BOTTLES DRAWN AEROBIC AND ANAEROBIC  10CC   Final    Culture  Setup Time [id number]   Final    Culture     Final    Value:        BLOOD CULTURE RECEIVED NO GROWTH TO DATE CULTURE WILL BE HELD FOR 5 DAYS BEFORE ISSUING A FINAL NEGATIVE REPORT   Report Status PENDING   Incomplete   CULTURE, BLOOD (ROUTINE X 2)     Status: Normal (Preliminary result)   Collection Time   06/24/11  6:45 AM      Component Value Range Status Comment   Specimen Description BLOOD LEFT ARM   Final    Special Requests BOTTLES DRAWN AEROBIC ONLY 5CC   Final    Culture  Setup Time [id number]   Final    Culture     Final    Value:        BLOOD CULTURE RECEIVED NO GROWTH TO DATE CULTURE WILL BE HELD FOR 5 DAYS BEFORE ISSUING A FINAL NEGATIVE REPORT   Report Status PENDING   Incomplete     Studies/Results: Dg Chest 2 View  06/24/2011  *RADIOLOGY REPORT*  Clinical Data: Hypoglycemia, weakness, previous stroke  CHEST - 2 VIEW  Comparison: Portable chest x-ray of 05/29/2011  Findings: There is cardiomegaly present. On the lateral view small pleural effusions are present most  consistent with mild congestive heart failure.  No focal infiltrate is seen.  No bony abnormality is seen other than diffuse osteopenia.  IMPRESSION:   Probable mild CHF with cardiomegaly and small effusions.  Original Report Authenticated By: Juline Patch, M.D.   Dg Chest Port 1 View  05/29/2011  *RADIOLOGY REPORT*  Clinical Data: Weakness.  Hypertensive.  Diabetic.  Smoker.  PORTABLE CHEST - 1 VIEW  Comparison: 03/28/2011 and 12/07/2007.  Findings: Granuloma peripheral aspect the left lung unchanged compared to 2009.  Right subclavian artery stent in place.  Cardiomegaly.  Pulmonary vascular prominence most notable centrally.  No frank pulmonary edema, segmental infiltrate or gross pneumothorax.  Calcified aorta.  IMPRESSION: Cardiomegaly.  Central pulmonary vascular prominence.  Original Report Authenticated By: Fuller Canada, M.D.    Medications: Scheduled Meds:   . aspirin  325  mg Oral Daily  . atorvastatin  20 mg Oral q1800  . cilostazol  100 mg Oral BID  . enoxaparin  40 mg Subcutaneous Q24H  . insulin aspart  0-9 Units Subcutaneous TID WC  . magnesium chloride  1 tablet Oral Daily  . sodium chloride  1,000 mL Intravenous Once  . sodium chloride  3 mL Intravenous Q12H  . DISCONTD: cloNIDine  0.2 mg Oral BID  . DISCONTD: cloNIDine  0.2 mg Oral TID  . DISCONTD: furosemide  40 mg Oral Q breakfast  . DISCONTD: hydrALAZINE  50 mg Oral QID  . DISCONTD: hydrALAZINE  50 mg Oral QID  . DISCONTD: isosorbide mononitrate  60 mg Oral Daily  . DISCONTD: losartan  100 mg Oral Daily  . DISCONTD: metoprolol tartrate  25 mg Oral BID   Continuous Infusions:   . sodium chloride 75 mL/hr at 06/25/11 1212  . DISCONTD: dextrose 5 % and 0.45% NaCl 75 mL/hr (06/24/11 1637)     Assessment/Plan: Principal Problem:  Accelerated Hypertension: Currently hypotensive - Will hold all BP meds, IV fluid bolus - Placed only on IV hydralazine PRN    Hypoglycemia - Reviewed diabetic coordinator's note, patient's HbA1c in March 2013 was 13.2 but in January 2013 was 6.5 - Recommended to obtain another HbA1c, continue sliding scale insulin for now, change to moderate given elevated BS - Patient currently off of long acting levemir  Active Problems:  DM (diabetes mellitus), type 2: See #2   Renovascular hypertension: Patient reported to have renovascular hypertension with left renal artery stent, patient supposed to have the stent changed on 07/06/2011 by Dr. Jacinto Halim.  DVT Prophylaxis: Lovenox  Disposition: Not medically ready   LOS: 1 day   Ananias Kolander M.D. [facility] 06/25/2011, 4:39 PM Pager: [phone]

## 2011-06-25 NOTE — Progress Notes (Signed)
Patient's CBG was 40, after juice CBG is 60; will recheck after patient's meal and continue to monitor patient__D. [name] RN

## 2011-06-25 NOTE — Progress Notes (Signed)
[employer] Tech reported patient had 10 beat run of Vtach, patient is asymptomatic; MD made aware and not orders given at this time will continue to monitor patient_______________________________________D. [name] RN

## 2011-06-25 NOTE — Progress Notes (Signed)
Inpatient Diabetes Program Recommendations  AACE/ADA: New Consensus Statement on Inpatient Glycemic Control (2009)  Target Ranges:  Prepandial:   less than 140 mg/dL      Peak postprandial:   less than 180 mg/dL (1-2 hours)      Critically ill patients:  140 - 180 mg/dL   Reason for assessment: Hypoglycemia on admission.  A1C from January is very different from most recent A1C=13.2.  Current Levemir dose should be appropriate for this patient but severe hypoglycemia does not correlate with current dose and A1C?    Inpatient Diabetes Program Recommendations HgbA1C: = 13.2 most recently in March but A1C=6.7 in January this year  Note: Recommend ordering a new A1C.    Thank you  Piedad Climes [facility] Inpatient Diabetes Coordinator [phone]

## 2011-06-25 NOTE — Clinical Documentation Improvement (Signed)
GENERIC DOCUMENTATION CLARIFICATION QUERY  THIS DOCUMENT IS NOT A PERMANENT PART OF THE MEDICAL RECORD  TO RESPOND TO THE THIS QUERY, FOLLOW THE INSTRUCTIONS BELOW:  1. If needed, update documentation for the patient's encounter via the notes activity.  2. Access this query again and click edit on the In [employer].  3. After updating, or not, click F2 to complete all highlighted (required) fields concerning your review. Select "additional documentation in the medical record" OR "no additional documentation provided".  4. Click Sign note button.  5. The deficiency will fall out of your In Basket *Please let us know if you are not able to complete this workflow by phone or e-mail (listed below).  Please update your documentation within the medical record to reflect your response to this query.                                                                                        06/25/11   Dear Dr.Klein / Associates,  In a better effort to capture your patient's severity of illness, reflect appropriate length of stay and utilization of resources, a review of the patient medical record has revealed the following indicators.    Possible Clinical Conditions? - mild Acute CHF (please specify type: Systolic, Diastolic, Mixed) - Other Condition (please specify) - Cannot Clinically Determine  Supporting Information: - Risk Factors: In ED BP 200/100, AMS, blood sugar 29, Renovascular hypertension s/p stent renal artery, cardiomegaly - Diagnostics: 4/18 CXR: "On the lateral view small pleural effusions are present most consistent with mild congestive heart failure. IMPRESSION: Probable mild CHF with cardiomegaly and small effusions" - Treatment: PO Lasix 40mg   You may use possible, probable, or suspect with inpatient documentation. possible, probable, suspected diagnoses MUST be documented at the time of discharge  Reviewed: additional documentation in the medical record  Thank  You,  Beverley Fiedler RN Clinical Documentation Specialist: [phone] Health Information Management [place]

## 2011-06-26 LAB — GLUCOSE, CAPILLARY
Glucose-Capillary: 149 mg/dL — ABNORMAL HIGH (ref 70–99)
Glucose-Capillary: 204 mg/dL — ABNORMAL HIGH (ref 70–99)
Glucose-Capillary: 124 mg/dL — ABNORMAL HIGH (ref 70–99)
Glucose-Capillary: 170 mg/dL — ABNORMAL HIGH (ref 70–99)
Glucose-Capillary: 133 mg/dL — ABNORMAL HIGH (ref 70–99)

## 2011-06-26 MED ORDER — INSULIN GLARGINE 100 UNIT/ML ~~LOC~~ SOLN
10.0000 [IU] | Freq: Every day | SUBCUTANEOUS | Status: DC
  Administered 2011-06-28: 10 [IU] via SUBCUTANEOUS

## 2011-06-26 MED ORDER — METOPROLOL TARTRATE 25 MG PO TABS
25.0000 mg | ORAL_TABLET | Freq: Two times a day (BID) | ORAL | Status: DC
  Administered 2011-06-26 – 2011-06-27 (×3): 25 mg via ORAL
  Filled 2011-06-26 (×4): qty 1

## 2011-06-26 MED ORDER — CLONIDINE HCL 0.2 MG PO TABS
0.2000 mg | ORAL_TABLET | Freq: Two times a day (BID) | ORAL | Status: DC
  Administered 2011-06-26 – 2011-06-27 (×3): 0.2 mg via ORAL
  Filled 2011-06-26 (×4): qty 1

## 2011-06-26 NOTE — Progress Notes (Signed)
Subjective: Patient seen, BP was elevated so restarted on BP meds. No new complaints.  Objective: Vital signs in last 24 hours: Temp:  [97.8 F (36.6 C)-98.7 F (37.1 C)] 98.7 F (37.1 C) (04/20 0900) Pulse Rate:  [62-80] 80  (04/20 0900) Resp:  [17-20] 17  (04/20 0900) BP: (155-198)/(56-72) 168/72 mmHg (04/20 1049) SpO2:  [96 %-99 %] 96 % (04/20 0900) Weight:  [56.1 kg (123 lb 10.9 oz)] 56.1 kg (123 lb 10.9 oz) (04/20 0500) Weight change: 0.6 kg (1 lb 5.2 oz) Last BM Date: 06/25/11  Intake/Output from previous day: 04/19 0701 - 04/20 0700 In: 5434.6 [P.O.:1560; I.V.:2341.3; IV Piggyback:1533.3] Out: 2101 [Urine:2100; Stool:1] Total I/O In: 720 [P.O.:720] Out: 1200 [Urine:1200]   Physical Exam: HEENT: Atraumatic, normocephalic Neck: Supple Chest : Clear to auscultation bilaterally, no wheezing, no crackles Heart : S1 S2 Regular, no murmurs Abdomen: Soft, Nontender, no organomegaly Ext : Trace edema bilaterally   Lab Results: Basic Metabolic Panel:  Basename 06/24/11 0638  NA 136  K 3.5  CL 110  CO2 20  GLUCOSE 50*  BUN 16  CREATININE 0.88  CALCIUM 9.1  MG 1.9  PHOS --   Liver Function Tests:  Basename 06/24/11 0638  AST 36  ALT 28  ALKPHOS 103  BILITOT 0.1*  PROT 6.1  ALBUMIN 2.9*   No results found for this basename: LIPASE:2,AMYLASE:2 in the last 72 hours No results found for this basename: AMMONIA:2 in the last 72 hours CBC:  Basename 06/24/11 0638  WBC 3.8*  NEUTROABS 3.0  HGB 15.5*  HCT 43.5  MCV 91.8  PLT 192   Cardiac Enzymes: No results found for this basename: CKTOTAL:3,CKMB:3,CKMBINDEX:3,TROPONINI:3 in the last 72 hours BNP: No results found for this basename: PROBNP:3 in the last 72 hours D-Dimer: No results found for this basename: DDIMER:2 in the last 72 hours CBG:  Basename 06/26/11 0608 06/25/11 2100 06/25/11 1734 06/25/11 1658 06/25/11 1627 06/25/11 1617  GLUCAP 204* 149* 110* 60* 53* 40*   Hemoglobin A1C:  Basename  06/25/11 1747  HGBA1C 11.6*   Fasting Lipid Panel: No results found for this basename: CHOL,HDL,LDLCALC,TRIG,CHOLHDL,LDLDIRECT in the last 72 hours Thyroid Function Tests: No results found for this basename: TSH,T4TOTAL,FREET4,T3FREE,THYROIDAB in the last 72 hours Anemia Panel: No results found for this basename: VITAMINB12,FOLATE,FERRITIN,TIBC,IRON,RETICCTPCT in the last 72 hours Coagulation: No results found for this basename: LABPROT:2,INR:2 in the last 72 hours Urine Drug Screen: Drugs of Abuse     Component Value Date/Time   LABOPIA NONE DETECTED 04/11/2009 1112   COCAINSCRNUR NONE DETECTED 04/11/2009 1112   LABBENZ NONE DETECTED 04/11/2009 1112   AMPHETMU NONE DETECTED 04/11/2009 1112   THCU NONE DETECTED 04/11/2009 1112   LABBARB NONE DETECTED 04/11/2009 1112    Alcohol Level: No results found for this basename: ETH:2 in the last 72 hours Urinalysis:  Basename 06/24/11 2327  COLORURINE YELLOW  LABSPEC 1.010  PHURINE 5.5  GLUCOSEU NEGATIVE  HGBUR NEGATIVE  BILIRUBINUR NEGATIVE  KETONESUR NEGATIVE  PROTEINUR 100*  UROBILINOGEN 0.2  NITRITE NEGATIVE  LEUKOCYTESUR NEGATIVE   Misc. Labs:  Recent Results (from the past 240 hour(s))  CULTURE, BLOOD (ROUTINE X 2)     Status: Normal (Preliminary result)   Collection Time   06/24/11  6:30 AM      Component Value Range Status Comment   Specimen Description BLOOD RIGHT ARM   Final    Special Requests BOTTLES DRAWN AEROBIC AND ANAEROBIC 10CC   Final    Culture  Setup Time [id number]  Final    Culture     Final    Value:        BLOOD CULTURE RECEIVED NO GROWTH TO DATE CULTURE WILL BE HELD FOR 5 DAYS BEFORE ISSUING A FINAL NEGATIVE REPORT   Report Status PENDING   Incomplete   CULTURE, BLOOD (ROUTINE X 2)     Status: Normal (Preliminary result)   Collection Time   06/24/11  6:45 AM      Component Value Range Status Comment   Specimen Description BLOOD LEFT ARM   Final    Special Requests BOTTLES DRAWN AEROBIC ONLY 5CC   Final      Culture  Setup Time [id number]   Final    Culture     Final    Value:        BLOOD CULTURE RECEIVED NO GROWTH TO DATE CULTURE WILL BE HELD FOR 5 DAYS BEFORE ISSUING A FINAL NEGATIVE REPORT   Report Status PENDING   Incomplete     Studies/Results: No results found.  Medications: Scheduled Meds:   . aspirin  325 mg Oral Daily  . atorvastatin  20 mg Oral q1800  . cilostazol  100 mg Oral BID  . cloNIDine  0.2 mg Oral BID  . enoxaparin  40 mg Subcutaneous Q24H  . insulin aspart  0-15 Units Subcutaneous TID WC  . insulin aspart  0-5 Units Subcutaneous QHS  . magnesium chloride  1 tablet Oral Daily  . metoprolol tartrate  25 mg Oral BID  . sodium chloride  1,000 mL Intravenous Once  . sodium chloride  3 mL Intravenous Q12H  . DISCONTD: cloNIDine  0.2 mg Oral TID  . DISCONTD: furosemide  40 mg Oral Q breakfast  . DISCONTD: hydrALAZINE  50 mg Oral QID  . DISCONTD: insulin aspart  0-9 Units Subcutaneous TID WC  . DISCONTD: isosorbide mononitrate  60 mg Oral Daily  . DISCONTD: losartan  100 mg Oral Daily  . DISCONTD: metoprolol tartrate  25 mg Oral BID   Continuous Infusions:   . sodium chloride 10 mL/hr at 06/26/11 1500   PRN Meds:.acetaminophen, acetaminophen, ALPRAZolam, hydrALAZINE, HYDROcodone-acetaminophen, DISCONTD: hydrALAZINE  Assessment/Plan:  Accelerated Hypertension: Currently hypotensive  Restart Clonidine and metoprolol as BP elevated.  Hypoglycemia  resolved Hb A1C is 11.7 Will start lantus 10 units at bedtime  DM (diabetes mellitus), type 2:  As above  Renovascular hypertension:  Patient reported to have renovascular hypertension with left renal artery stent, patient supposed to have the stent changed on 07/06/2011 by Dr. Jacinto Halim.   DVT Prophylaxis: Lovenox        LOS: 2 days   [facility] S [facility] Pager: [phone] 06/26/2011, 3:06 PM

## 2011-06-27 LAB — GLUCOSE, CAPILLARY: Glucose-Capillary: 160 mg/dL — ABNORMAL HIGH (ref 70–99)

## 2011-06-27 MED ORDER — LOSARTAN POTASSIUM 50 MG PO TABS
50.0000 mg | ORAL_TABLET | Freq: Every day | ORAL | Status: DC
  Administered 2011-06-27 – 2011-06-29 (×3): 50 mg via ORAL
  Filled 2011-06-27 (×3): qty 1

## 2011-06-27 MED ORDER — PINDOLOL 10 MG PO TABS
10.0000 mg | ORAL_TABLET | Freq: Two times a day (BID) | ORAL | Status: DC
  Administered 2011-06-27 – 2011-06-29 (×4): 10 mg via ORAL
  Filled 2011-06-27 (×5): qty 1

## 2011-06-27 MED ORDER — CLONIDINE HCL 0.1 MG PO TABS
0.1000 mg | ORAL_TABLET | Freq: Two times a day (BID) | ORAL | Status: DC
  Administered 2011-06-27 – 2011-06-29 (×4): 0.1 mg via ORAL
  Filled 2011-06-27 (×5): qty 1

## 2011-06-27 MED ORDER — HYDRALAZINE HCL 25 MG PO TABS
25.0000 mg | ORAL_TABLET | Freq: Three times a day (TID) | ORAL | Status: DC
  Administered 2011-06-27 – 2011-06-29 (×6): 25 mg via ORAL
  Filled 2011-06-27 (×9): qty 1

## 2011-06-27 NOTE — Progress Notes (Signed)
Called by Nurse as patient was not feeling good and tele showed Mobitz type 2 heart block which lasted for a minute. The EKG showed sinus bradycardia. Patient  Is on clonidine 0.2 mg po BID and Metoprolol 25 mg po BID. Her Cardiologist is Dr Jacinto Halim, [facility] and discussed with Dr Jacinto Halim, who recommends to put her on Pindolol 10 mg po bid and stop Metoprolol as it cause less bradycardia. Also recommends to cut down Catapress to 0.1 mg Po BID. Patient will need permanent pacemaker for second degree heart block with HR dropping in 20's. She is asymptomatic at this time, and will monitor on these medications, if she again becomes symptomatic, will call Dr Jacinto Halim and need to be transferred to SDU.

## 2011-06-27 NOTE — Progress Notes (Signed)
Called to room by monitor tech. Pt had episode of 2 degree heart block- symptomatic. bp 148/56 with return to SR low 60s.   No diaphoresis but c/o nausea and feeling like she wanted to go to sleep. Dr Sharl Ma made aware

## 2011-06-27 NOTE — Progress Notes (Addendum)
Subjective: Patient seen, BP was elevated so restarted on BP meds. BP is still elevated, she is on multiple medications at home, Her BP dropped when all of those medications were started. We have reintroduced the home meds, and will need adjustment of these meds before discharge. Denies any complaints today.  Objective: Vital signs in last 24 hours: Temp:  [97 F (36.1 C)-98.2 F (36.8 C)] 97 F (36.1 C) (04/21 0706) Pulse Rate:  [61-71] 61  (04/21 0706) Resp:  [16-18] 17  (04/21 0706) BP: (122-213)/(40-70) 165/58 mmHg (04/21 0706) SpO2:  [97 %-100 %] 100 % (04/21 0706) Weight:  [56.972 kg (125 lb 9.6 oz)] 56.972 kg (125 lb 9.6 oz) (04/21 0706) Weight change:  Last BM Date: 06/26/11  Intake/Output from previous day: 04/20 0701 - 04/21 0700 In: 960 [P.O.:960] Out: 1800 [Urine:1800] Total I/O In: 720 [P.O.:720] Out: 300 [Urine:300]   Physical Exam: HEENT: Atraumatic, normocephalic Neck: Supple Chest : Clear to auscultation bilaterally, no wheezing, no crackles Heart : S1 S2 Regular, no murmurs Abdomen: Soft, Nontender, no organomegaly Ext : Trace edema bilaterally   Lab Results: Basic Metabolic Panel: No results found for this basename: NA:2,K:2,CL:2,CO2:2,GLUCOSE:2,BUN:2,CREATININE:2,CALCIUM:2,MG:2,PHOS:2 in the last 72 hours Liver Function Tests: No results found for this basename: AST:2,ALT:2,ALKPHOS:2,BILITOT:2,PROT:2,ALBUMIN:2 in the last 72 hours No results found for this basename: LIPASE:2,AMYLASE:2 in the last 72 hours No results found for this basename: AMMONIA:2 in the last 72 hours CBC: No results found for this basename: WBC:2,NEUTROABS:2,HGB:2,HCT:2,MCV:2,PLT:2 in the last 72 hours Cardiac Enzymes: No results found for this basename: CKTOTAL:3,CKMB:3,CKMBINDEX:3,TROPONINI:3 in the last 72 hours BNP: No results found for this basename: PROBNP:3 in the last 72 hours D-Dimer: No results found for this basename: DDIMER:2 in the last 72 hours CBG:  Basename  06/27/11 0629 06/26/11 2124 06/26/11 1629 06/26/11 1123 06/26/11 0608 06/25/11 2100  GLUCAP 160* 133* 170* 124* 204* 149*   Hemoglobin A1C:  Basename 06/25/11 1747  HGBA1C 11.6*   Fasting Lipid Panel: No results found for this basename: CHOL,HDL,LDLCALC,TRIG,CHOLHDL,LDLDIRECT in the last 72 hours Thyroid Function Tests: No results found for this basename: TSH,T4TOTAL,FREET4,T3FREE,THYROIDAB in the last 72 hours Anemia Panel: No results found for this basename: VITAMINB12,FOLATE,FERRITIN,TIBC,IRON,RETICCTPCT in the last 72 hours Coagulation: No results found for this basename: LABPROT:2,INR:2 in the last 72 hours Urine Drug Screen: Drugs of Abuse     Component Value Date/Time   LABOPIA NONE DETECTED 04/11/2009 1112   COCAINSCRNUR NONE DETECTED 04/11/2009 1112   LABBENZ NONE DETECTED 04/11/2009 1112   AMPHETMU NONE DETECTED 04/11/2009 1112   THCU NONE DETECTED 04/11/2009 1112   LABBARB NONE DETECTED 04/11/2009 1112    Alcohol Level: No results found for this basename: ETH:2 in the last 72 hours Urinalysis:  Basename 06/24/11 2327  COLORURINE YELLOW  LABSPEC 1.010  PHURINE 5.5  GLUCOSEU NEGATIVE  HGBUR NEGATIVE  BILIRUBINUR NEGATIVE  KETONESUR NEGATIVE  PROTEINUR 100*  UROBILINOGEN 0.2  NITRITE NEGATIVE  LEUKOCYTESUR NEGATIVE   Misc. Labs:  Recent Results (from the past 240 hour(s))  CULTURE, BLOOD (ROUTINE X 2)     Status: Normal (Preliminary result)   Collection Time   06/24/11  6:30 AM      Component Value Range Status Comment   Specimen Description BLOOD RIGHT ARM   Final    Special Requests BOTTLES DRAWN AEROBIC AND ANAEROBIC 10CC   Final    Culture  Setup Time [id number]   Final    Culture     Final    Value:  BLOOD CULTURE RECEIVED NO GROWTH TO DATE CULTURE WILL BE HELD FOR 5 DAYS BEFORE ISSUING A FINAL NEGATIVE REPORT   Report Status PENDING   Incomplete   CULTURE, BLOOD (ROUTINE X 2)     Status: Normal (Preliminary result)   Collection Time   06/24/11  6:45  AM      Component Value Range Status Comment   Specimen Description BLOOD LEFT ARM   Final    Special Requests BOTTLES DRAWN AEROBIC ONLY 5CC   Final    Culture  Setup Time [id number]   Final    Culture     Final    Value:        BLOOD CULTURE RECEIVED NO GROWTH TO DATE CULTURE WILL BE HELD FOR 5 DAYS BEFORE ISSUING A FINAL NEGATIVE REPORT   Report Status PENDING   Incomplete     Studies/Results: No results found.  Medications: Scheduled Meds:    . aspirin  325 mg Oral Daily  . atorvastatin  20 mg Oral q1800  . cilostazol  100 mg Oral BID  . cloNIDine  0.2 mg Oral BID  . enoxaparin  40 mg Subcutaneous Q24H  . insulin aspart  0-15 Units Subcutaneous TID WC  . insulin aspart  0-5 Units Subcutaneous QHS  . insulin glargine  10 Units Subcutaneous QHS  . losartan  50 mg Oral Daily  . magnesium chloride  1 tablet Oral Daily  . metoprolol tartrate  25 mg Oral BID  . sodium chloride  3 mL Intravenous Q12H   Continuous Infusions:    . sodium chloride 10 mL/hr at 06/26/11 1600   PRN Meds:.acetaminophen, acetaminophen, ALPRAZolam, hydrALAZINE, HYDROcodone-acetaminophen  Assessment/Plan:  Accelerated Hypertension Restarted Clonidine and metoprolol  Her BP is still elevated, I am going to restart  Losartan at lower dose of 50 mg po daily  Will restart Hydralazine at lower dose of 25 mg po TID instaead of her home dose of 50 mg po QID.   Hypoglycemia  resolved Hb A1C is 11.7 Will start lantus 10 units at bedtime Morning Blood sugar today is 160  DM (diabetes mellitus), type 2:  As above  Renovascular hypertension:  Patient reported to have renovascular hypertension with left renal artery stent, patient supposed to have the stent changed on 07/06/2011 by Dr. Jacinto Halim.   DVT Prophylaxis: Lovenox        LOS: 3 days   [facility] Pager: [phone] 06/27/2011, 11:14 AM

## 2011-06-27 NOTE — Progress Notes (Signed)
Called by bedside RN to see patient, patient HR dropped into the 20's and became symptomatic with nausea, dizziness and feeling very tired.  Also brief period of Mobitz 2 on tele. Upon arrival to room, patient lying supine in bed on nasal cannula, alert and oriented.  Patient denies CP, SOB, skin warm and dry. Patient states she feels OK now.  Patient placed on zoll monitor and pads applied.  EKG done. MD notified and at bedside.  Patients HR has been 55-61 for 40 minutes now.  Dr. Sharl Ma spoke with Cards.  Patient to remain on unit. RRT no longer needed. RN to call if assistance needed

## 2011-06-28 ENCOUNTER — Encounter (HOSPITAL_COMMUNITY)
Admission: EM | Disposition: A | Discharge status: Home / Self Care | Payer: Self-pay | Source: Ambulatory Visit | Attending: Internal Medicine

## 2011-06-28 DIAGNOSIS — I441 Atrioventricular block, second degree: Secondary | ICD-10-CM

## 2011-06-28 HISTORY — PX: PERMANENT PACEMAKER INSERTION: SHX5480

## 2011-06-28 LAB — GLUCOSE, CAPILLARY
Glucose-Capillary: 200 mg/dL — ABNORMAL HIGH (ref 70–99)
Glucose-Capillary: 142 mg/dL — ABNORMAL HIGH (ref 70–99)
Glucose-Capillary: 135 mg/dL — ABNORMAL HIGH (ref 70–99)

## 2011-06-28 SURGERY — PERMANENT PACEMAKER INSERTION
Anesthesia: LOCAL | Laterality: Left

## 2011-06-28 MED ORDER — CEFAZOLIN SODIUM 1-5 GM-% IV SOLN
1.0000 g | Freq: Four times a day (QID) | INTRAVENOUS | Status: AC
  Administered 2011-06-28 – 2011-06-29 (×3): 1 g via INTRAVENOUS
  Filled 2011-06-28 (×3): qty 50

## 2011-06-28 MED ORDER — SODIUM CHLORIDE 0.9 % IR SOLN
Status: AC
  Administered 2011-06-28: 17:00:00
  Filled 2011-06-28: qty 2

## 2011-06-28 MED ORDER — MIDAZOLAM HCL 5 MG/5ML IJ SOLN
INTRAMUSCULAR | Status: AC
  Filled 2011-06-28: qty 5

## 2011-06-28 MED ORDER — SODIUM CHLORIDE 0.9 % IV SOLN
INTRAVENOUS | Status: DC
  Administered 2011-06-28: 15:00:00 via INTRAVENOUS

## 2011-06-28 MED ORDER — LIDOCAINE HCL (PF) 1 % IJ SOLN
INTRAMUSCULAR | Status: AC
  Filled 2011-06-28: qty 60

## 2011-06-28 MED ORDER — SODIUM CHLORIDE 0.9 % IR SOLN
80.0000 mg | Status: DC
  Administered 2011-06-28: 80 mg
  Filled 2011-06-28: qty 2

## 2011-06-28 MED ORDER — SODIUM CHLORIDE 0.9 % IV SOLN
INTRAVENOUS | Status: DC
  Administered 2011-06-28: 18:00:00 via INTRAVENOUS

## 2011-06-28 MED ORDER — CEFAZOLIN SODIUM 1-5 GM-% IV SOLN
1.0000 g | INTRAVENOUS | Status: DC
  Administered 2011-06-28: 1 g via INTRAVENOUS
  Filled 2011-06-28: qty 50

## 2011-06-28 MED ORDER — CHLORHEXIDINE GLUCONATE 4 % EX LIQD
60.0000 mL | Freq: Once | CUTANEOUS | Status: DC
  Filled 2011-06-28: qty 60

## 2011-06-28 MED ORDER — CEFAZOLIN SODIUM 1-5 GM-% IV SOLN
INTRAVENOUS | Status: AC
  Filled 2011-06-28: qty 100

## 2011-06-28 MED ORDER — ONDANSETRON HCL 4 MG/2ML IJ SOLN: 4.0000 mg | Freq: Four times a day (QID) | INTRAMUSCULAR | Status: DC | PRN

## 2011-06-28 MED ORDER — FENTANYL CITRATE 0.05 MG/ML IJ SOLN
INTRAMUSCULAR | Status: AC
  Filled 2011-06-28: qty 2

## 2011-06-28 MED ORDER — LABETALOL HCL 5 MG/ML IV SOLN
INTRAVENOUS | Status: AC
  Filled 2011-06-28: qty 8

## 2011-06-28 MED ORDER — LABETALOL HCL 5 MG/ML IV SOLN
INTRAVENOUS | Status: AC
  Filled 2011-06-28: qty 4

## 2011-06-28 MED ORDER — ACETAMINOPHEN 325 MG PO TABS: 325.0000 mg | ORAL_TABLET | ORAL | Status: DC | PRN

## 2011-06-28 MED ORDER — SODIUM CHLORIDE 0.45 % IV SOLN: INTRAVENOUS | Status: DC

## 2011-06-28 NOTE — CV Procedure (Signed)
Preop DX::intermittent heart block Post op DX:: same  Procedure  dual pacemaker implantation  After routine prep and drape, lidocaine was infiltrated in the prepectoral subclavicular region on the left side an incision was made and carried down to later the prepectoral fascia using electrocautery and sharp dissection a pocket was formed similarly. Hemostasis was obtained.  After this, we turned our attention to gaining accessm to the extrathoracic,left subclavian vein. This was accomplished without difficulty and without the aspiration of air or puncture of the artery. 2 separate venipunctures were accomplished; guidewires were placed and retained and sequentially 7 French sheath through which were  passed an [employer]onic ventricular lead serial number [id number] and medtronic atrial lead serial number [id number]  .  The ventricular lead was manipulated to the right ventricular apex with a bipolar R wave was 20, the pacing impedance was942, the threshold was 0.6 @ 0.5 msec  Current at threshold was  0 .6 and the current of injury was brisk.  The right atrial lead was manipulated to the right atrial appendage with a bipolar P-wave  3.1, the pacing impedance was1043, the threshold 1.4@ 0.5 msec   Current at threshold was0.7 and the current of injury was brisk.  The ventricular lead was marked with a tie prior to the insertion of the atrial lead. The leads were affixed to the prepectoral fascia and attached to a [employer] pulse generator serial number  [id number]  P-synchronous/ AV  Pacing noted .  Hemostasis was obtained. The pocket was copiously irrigated with antibiotic containing saline solution. The leads and the pulse generator were placed in the pocket and affixed to the prepectoral fascia. The wound is then closed in 3 layers in normal fashion. The wound is washed dried and a benzoin Steri-Strip this was applied the account sponge counts and instrument counts were correct at the end of the  procedure .Marland Kitchen The patient tolerated the procedure without apparent complication.  Gerlene Burdock.D.

## 2011-06-28 NOTE — Progress Notes (Signed)
Patient ID: Carmen [name]  female  ZOX:[id number]    DOB: [birth year]    DOA: 06/24/2011  PCP: Gwynneth Aliment, MD, MD  Subjective: No specific complaints per the patient  Objective: Weight change:   Intake/Output Summary (Last 24 hours) at 06/28/11 1339 Last data filed at 06/28/11 1249  Gross per 24 hour  Intake    603 ml  Output    500 ml  Net    103 ml   Blood pressure 167/56, pulse 64, temperature 97.1 F (36.2 C), temperature source Oral, resp. rate 18, height 5\' 4"  (1.626 m), weight 58.514 kg (129 lb), SpO2 97.00%.  Physical Exam: General: Alert and awake, oriented x3, not in any acute distress. HEENT: anicteric sclera, pupils reactive to light and accommodation, EOMI CVS: S1-S2 clear, no murmur rubs or gallops Chest: clear to auscultation bilaterally, no wheezing, rales or rhonchi Abdomen: soft nontender, nondistended, normal bowel sounds, no organomegaly Extremities: no cyanosis, clubbing or edema noted bilaterally Neuro: Cranial nerves II-XII intact, no focal neurological deficits  Lab Results: Basic Metabolic Panel:  Lab 06/24/11 8119  NA 136  K 3.5  CL 110  CO2 20  GLUCOSE 50*  BUN 16  CREATININE 0.88  CALCIUM 9.1  MG 1.9  PHOS --   Liver Function Tests:  Lab 06/24/11 0638  AST 36  ALT 28  ALKPHOS 103  BILITOT 0.1*  PROT 6.1  ALBUMIN 2.9*   No results found for this basename: LIPASE:2,AMYLASE:2 in the last 168 hours No results found for this basename: AMMONIA:2 in the last 168 hours CBC:  Lab 06/24/11 0638  WBC 3.8*  NEUTROABS 3.0  HGB 15.5*  HCT 43.5  MCV 91.8  PLT 192   Cardiac Enzymes: No results found for this basename: CKTOTAL:3,CKMB:3,CKMBINDEX:3,TROPONINI:3 in the last 168 hours BNP: No components found with this basename: POCBNP:2 CBG:  Lab 06/28/11 1120 06/28/11 0646 06/27/11 2205 06/27/11 1632 06/27/11 1519  GLUCAP 188* 227* 135* 142* 200*     Micro Results: Recent Results (from the past 240 hour(s))  CULTURE, BLOOD  (ROUTINE X 2)     Status: Normal (Preliminary result)   Collection Time   06/24/11  6:30 AM      Component Value Range Status Comment   Specimen Description BLOOD RIGHT ARM   Final    Special Requests BOTTLES DRAWN AEROBIC AND ANAEROBIC 10CC   Final    Culture  Setup Time [id number]   Final    Culture     Final    Value:        BLOOD CULTURE RECEIVED NO GROWTH TO DATE CULTURE WILL BE HELD FOR 5 DAYS BEFORE ISSUING A FINAL NEGATIVE REPORT   Report Status PENDING   Incomplete   CULTURE, BLOOD (ROUTINE X 2)     Status: Normal (Preliminary result)   Collection Time   06/24/11  6:45 AM      Component Value Range Status Comment   Specimen Description BLOOD LEFT ARM   Final    Special Requests BOTTLES DRAWN AEROBIC ONLY 5CC   Final    Culture  Setup Time [id number]   Final    Culture     Final    Value:        BLOOD CULTURE RECEIVED NO GROWTH TO DATE CULTURE WILL BE HELD FOR 5 DAYS BEFORE ISSUING A FINAL NEGATIVE REPORT   Report Status PENDING   Incomplete     Studies/Results: Dg Chest 2 View  06/24/2011  *RADIOLOGY REPORT*  Clinical Data: Hypoglycemia, weakness, previous stroke  CHEST - 2 VIEW  Comparison: Portable chest x-ray of 05/29/2011  Findings: There is cardiomegaly present. On the lateral view small pleural effusions are present most consistent with mild congestive heart failure.  No focal infiltrate is seen.  No bony abnormality is seen other than diffuse osteopenia.  IMPRESSION:   Probable mild CHF with cardiomegaly and small effusions.  Original Report Authenticated By: Juline Patch, M.D.   Dg Chest Port 1 View  05/29/2011  *RADIOLOGY REPORT*  Clinical Data: Weakness.  Hypertensive.  Diabetic.  Smoker.  PORTABLE CHEST - 1 VIEW  Comparison: 03/28/2011 and 12/07/2007.  Findings: Granuloma peripheral aspect the left lung unchanged compared to 2009.  Right subclavian artery stent in place.  Cardiomegaly.  Pulmonary vascular prominence most notable centrally.  No frank pulmonary edema,  segmental infiltrate or gross pneumothorax.  Calcified aorta.  IMPRESSION: Cardiomegaly.  Central pulmonary vascular prominence.  Original Report Authenticated By: Fuller Canada, M.D.    Medications: Scheduled Meds:   . aspirin  325 mg Oral Daily  . atorvastatin  20 mg Oral q1800  . cilostazol  100 mg Oral BID  . cloNIDine  0.1 mg Oral BID  . enoxaparin  40 mg Subcutaneous Q24H  . hydrALAZINE  25 mg Oral Q8H  . insulin aspart  0-15 Units Subcutaneous TID WC  . insulin aspart  0-5 Units Subcutaneous QHS  . insulin glargine  10 Units Subcutaneous QHS  . losartan  50 mg Oral Daily  . magnesium chloride  1 tablet Oral Daily  . pindolol  10 mg Oral BID  . sodium chloride  3 mL Intravenous Q12H  . DISCONTD: cloNIDine  0.2 mg Oral BID  . DISCONTD: metoprolol tartrate  25 mg Oral BID   Continuous Infusions:   . sodium chloride 10 mL/hr at 06/26/11 1600     Assessment/Plan: Principal Problem:  *Hypoglycemia: Resolved - Patient is actually somewhat hyperglycemic now, refusing Lantus   Accelerated hypertension/renovascular hypertension: Difficult to control - Seen by Dr. Jacinto Halim this morning, multiple adjustments made to her antihypertensives, will follow - Per cardiology recommendations, No renal intervention/stent change at this time until rhythm issues are cleared  Mobitz type II, second degree AV block: - Plan for inpatient pacemaker placement  Active Problems:  DM (diabetes mellitus), type 2: as #1   Hyperlipidemia: Continue Lipitor  PAD: Continue aspirin, Pletal  DVT Prophylaxis: Lovenox  Code Status:  Disposition: Hopefully in 1-2 days   LOS: 4 days   Shailah Gibbins M.D. [facility] 06/28/2011, 1:39 PM Pager: [phone]

## 2011-06-28 NOTE — Progress Notes (Signed)
Inpatient Diabetes Program Recommendations  AACE/ADA: New Consensus Statement on Inpatient Glycemic Control (2009)  Target Ranges:  Prepandial:   less than 140 mg/dL      Peak postprandial:   less than 180 mg/dL (1-2 hours)      Critically ill patients:  140 - 180 mg/dL   Per documentation, patient has refused Lantus the last 2 nights.  Fasting CBG=227 this morning.    Thank you  Piedad Climes [facility] Inpatient Diabetes Coordinator [phone]

## 2011-06-28 NOTE — Consult Note (Signed)
ELECTROPHYSIOLOGY CONSULT NOTE    Patient ID: Carmen [name] MRN: [id number], DOB/AGE: 05-19-1933 77 y.o.  Admit date: 06/24/2011 Date of Consult: 06-28-2011  Primary Physician: Gwynneth Aliment, MD, MD Primary Cardiologist: Yates Decamp, MD  Reason for Consultation: symptomatic heart block  HPI:  Carmen [name] is a 78 year old female with a history of hypertension, renal artery stenosis, peripheral vascular disease, stroke, diabetes, and anxiety.  She has been followed by Dr Jacinto Halim and was scheduled for renal angiography on the 30th of this month.  However, she was admitted on 06-24-2011 for lethargy and confusion.  At that time, her blood sugar was 29.  She has been monitored on telemetry since admission and her diabetes and anti-hypertensives have been adjusted.    Yesterday, she was sitting up in the bed and had a wave of nausea and light headedness.  At that time, her heart rate was found to be in the 30's and she was in 2:1 heart block.  She states that this is the first time she has felt this way.  She denies dizziness, palpitations, syncope, or presyncope in the past.  She has been on Metoprolol and Clonidine for her blood pressure control.  After her episode of heart block, her clonidine was stopped and her metoprolol was changed to pindolol.    Dr Jacinto Halim evaluated the patient and felt that she was appropriate for pacemaker placement.  EP has been asked to evaluate.   No antecedent syncope or presyncope  PMHx >> CAD but i find no record of cath or nuclear study  Her BP has been refractory and she had undergone prev Renal Artery Stenting and remains on multiple drugs  Past Medical History  Diagnosis Date  . Diabetes mellitus   . Hypertension   . Coronary artery disease   . Renal disorder   . Herniated lumbar intervertebral disc   . Cataract     cataract surgery scheduled for 03/31/11, 2 - left eye, 1 - right eye  . Renovascular hypertension      s/p left renal artery stent  12/2007.   S/P balloon angioplasty on 02/16/10 for ISR, BP was  controlled well since then.     . Claudication in peripheral vascular disease     02/16/10: Left CIA 9.0x28 Omnilink and REIA 8.0x40 seff expanding Zilver.   right subclavian artery stent 03/18/2008,  . S/P carotid endarterectomy      left carotid endarterectomy  1991; Stroke and TIA in 1999 with right sided weakness, now with residual right arm weakness  . Stroke     TIA  . Peripheral vascular disease   . Anxiety      Surgical History:  Past Surgical History  Procedure Date  . Appendectomy   . Abdominal hysterectomy   . Ureteral stent placement   . Carotid endarterectomy   . Laminectomy   . Back surgery      Prescriptions prior to admission  Medication Sig Dispense Refill  . ALPRAZolam (XANAX) 0.5 MG tablet Take 0.5 mg by mouth daily as needed. For anxiety      . aspirin 325 MG tablet Take 325 mg by mouth daily.      . cilostazol (PLETAL) 100 MG tablet Take 100 mg by mouth 2 (two) times daily.      . cloNIDine (CATAPRES) 0.2 MG tablet Take 0.2 mg by mouth 2 (two) times daily.      . furosemide (LASIX) 40 MG tablet Take 40 mg by mouth  daily.      . hydrALAZINE (APRESOLINE) 50 MG tablet Take 1 tablet (50 mg total) by mouth 4 (four) times daily.  120 tablet  0  . insulin detemir (LEVEMIR) 100 UNIT/ML injection Inject 5 Units into the skin 2 (two) times daily.      . isosorbide mononitrate (IMDUR) 60 MG 24 hr tablet Take 1 tablet (60 mg total) by mouth daily.  30 tablet  0  . losartan (COZAAR) 100 MG tablet Take 1 tablet (100 mg total) by mouth daily.  30 tablet  0  . magnesium chloride (SLOW-MAG) 64 MG TBEC Take 1 tablet (64 mg total) by mouth daily.  60 tablet  0  . metoprolol tartrate (LOPRESSOR) 25 MG tablet Take 25 mg by mouth 2 (two) times daily.      . potassium chloride SA (K-DUR,KLOR-CON) 20 MEQ tablet Take 1 tablet (20 mEq total) by mouth 2 (two) times daily.  60 tablet  0  . rosuvastatin (CRESTOR) 10 MG tablet  Take 10 mg by mouth daily.        Inpatient Medications:    . aspirin  325 mg Oral Daily  . atorvastatin  20 mg Oral q1800  . cilostazol  100 mg Oral BID  . cloNIDine  0.1 mg Oral BID  . enoxaparin  40 mg Subcutaneous Q24H  . hydrALAZINE  25 mg Oral Q8H  . insulin aspart  0-15 Units Subcutaneous TID WC  . insulin aspart  0-5 Units Subcutaneous QHS  . insulin glargine  10 Units Subcutaneous QHS  . losartan  50 mg Oral Daily  . magnesium chloride  1 tablet Oral Daily  . pindolol  10 mg Oral BID  . sodium chloride  3 mL Intravenous Q12H    Allergies:  Allergies  Allergen Reactions  . Norvasc (Amlodipine Besylate) Swelling    History   Social History  . Marital Status: Married    Spouse Name: N/A    Number of Children: 1 biological, raised 12 of her family member's children  . Years of Education: N/A   Social History Main Topics  . Smoking status: Current Everyday Smoker -- 0.5 packs/day for 65 years    Types: Cigarettes  . Smokeless tobacco: Never Used  . Alcohol Use: No  . Drug Use: No  . Sexually Active: No   No pertinent family history. BP 167/56  Pulse 64  Temp(Src) 97.1 F (36.2 C) (Oral)  Resp 18  Ht 5\' 4"  (1.626 m)  Wt 129 lb (58.514 kg)  BMI 22.14 kg/m2  SpO2 97%  Well developed and nourished in no acute distress HENT normal Neck supple with JVP-flat Clear Back with kyphosis LN neg Regular rate and rhythm, no murmurs or gallops Abd-soft with active BS No Clubbing cyanosis 3 edema Skin-warm and dry A & Oriented  Grossly normal sensory and motor function  Labs:   Lab Results  Component Value Date   WBC 3.8* 06/24/2011   HGB 15.5* 06/24/2011   HCT 43.5 06/24/2011   MCV 91.8 06/24/2011   PLT 192 06/24/2011    Lab 06/24/11 0638  NA 136  K 3.5  CL 110  CO2 20  BUN 16  CREATININE 0.88  CALCIUM 9.1  PROT 6.1  BILITOT 0.1*  ALKPHOS 103  ALT 28  AST 36  GLUCOSE 50*    Radiology/Studies: Dg Chest 2 View 06/24/2011  *RADIOLOGY REPORT*   Clinical Data: Hypoglycemia, weakness, previous stroke  CHEST - 2 VIEW  Comparison: Portable chest  x-ray of 05/29/2011  Findings: There is cardiomegaly present. On the lateral view small pleural effusions are present most consistent with mild congestive heart failure.  No focal infiltrate is seen.  No bony abnormality is seen other than diffuse osteopenia.  IMPRESSION:   Probable mild CHF with cardiomegaly and small effusions.  Original Report Authenticated By: Juline Patch, M.D.   WUJ:WJXBJ brady   Echo [phone]): EF 60-65%, no regional wall motion abnormalities, LA 41, mildly increased pulmonary artery pressure  TELEMETRY: sinus rhythm with periods of 2:1 heart block and occasional complete heart block with multiple non conducted P waves.  IMP 1) 2:1 AVB this am without symptoms and  episode yesterday assoc with nausea and vomiting   2) poorly controlled hypertension 3) peripheral vascular disease 4) prior CVA (cant find record)  The issues here are difficult as the most alarming bradyepisode was concurrent or preceded by nausea and may be vagal  She has no prior symptoms of lightheadedness, but this am she had HR while awake <40 and has BP control requires a number of drugs, betablockers and clnidine both of which can affect conduction it is reasonable to proceed with pacing   The benefits and risks were reviewed including but not limited to death,  perforation, infection, lead dislodgement and device malfunction.  The patient understands agrees and is willing to proceed.

## 2011-06-28 NOTE — Consult Note (Addendum)
CARDIOLOGY CONSULT NOTE  Patient ID: ELICE CRIGGER MRN: [id number] DOB/AGE: [birth year] 78 y.o.  Admit date: 06/24/2011 Referring Physician Drusilla Kanner Primary Physician: Gwynneth Aliment, MD, MD Reason for Consultation Heart Block  HPI: Patient is well known to me and is 78 years of age. She has secondary hypertension. She has known renal artery stent that was performed previously and has had instent restenosis. She has had balloon angioplasty for the instent restenosis. Recently she has been having recurrence of uncontrolled hypertension. I was planning on performing renal angiography in the outpatient setting. However, the procedure has been delayed due to her recurrent admissions to the hospital with non-hypertension related issues including altered mental status, hypoglycemia. Patient has had several episodes of second-degree AV block and was felt to be due to medications. Again this admission she was on Catapres 0.2 mg by mouth twice a day and metoprolol 25 mg by mouth daily and again developed transient third-degree AV block and persistent Mobitz type II second-degree AV block. Patient was symptomatic with heart cath with heart rates dropping to 20-30 per minute. We had reduced Catapres to 0.1 mg by mouth twice a day and discontinued the Toprol I switched her over to Pindalol and this morning patient had Mobitz type 2 second degree AV block again. Give her advanced age I feel comment pacemaker implantation was indicated. And seeing her vallate for the same.  Past Medical History  Diagnosis Date  . Diabetes mellitus   . Hypertension   . Coronary artery disease   . Renal disorder   . Herniated lumbar intervertebral disc   . Cataract     cataract surgery scheduled for 03/31/11, 2 - left eye, 1 - right eye  . Renovascular hypertension      s/p left renal artery stent 12/2007.   S/P balloon angioplasty on 02/16/10 for ISR, BP was  controlled well since then.     . Claudication in peripheral  vascular disease     02/16/10: Left CIA 9.0x28 Omnilink and REIA 8.0x40 seff expanding Zilver.   right subclavian artery stent 03/18/2008,  . S/P carotid endarterectomy      left carotid endarterectomy  1991; Stroke and TIA in 1999 with right sided weakness, now with residual right arm weakness  . Stroke     TIA  . Peripheral vascular disease   . Anxiety      Past Surgical History  Procedure Date  . Appendectomy   . Abdominal hysterectomy   . Ureteral stent placement   . Carotid endarterectomy   . Laminectomy   . Back surgery      History reviewed. No pertinent family history.  Social History: History   Social History  . Marital Status: Married    Spouse Name: N/A    Number of Children: N/A  . Years of Education: N/A   Occupational History  . Not on file.   Social History Main Topics  . Smoking status: Current Everyday Smoker -- 0.5 packs/day for 65 years    Types: Cigarettes  . Smokeless tobacco: Never Used  . Alcohol Use: No  . Drug Use: No  . Sexually Active: No   Other Topics Concern  . Not on file   Social History Narrative  . No narrative on file     Prescriptions prior to admission  Medication Sig Dispense Refill  . ALPRAZolam (XANAX) 0.5 MG tablet Take 0.5 mg by mouth daily as needed. For anxiety      . aspirin 325 MG  tablet Take 325 mg by mouth daily.      . cilostazol (PLETAL) 100 MG tablet Take 100 mg by mouth 2 (two) times daily.      . cloNIDine (CATAPRES) 0.2 MG tablet Take 0.2 mg by mouth 2 (two) times daily.      . furosemide (LASIX) 40 MG tablet Take 40 mg by mouth daily.      . hydrALAZINE (APRESOLINE) 50 MG tablet Take 1 tablet (50 mg total) by mouth 4 (four) times daily.  120 tablet  0  . insulin detemir (LEVEMIR) 100 UNIT/ML injection Inject 5 Units into the skin 2 (two) times daily.      . isosorbide mononitrate (IMDUR) 60 MG 24 hr tablet Take 1 tablet (60 mg total) by mouth daily.  30 tablet  0  . losartan (COZAAR) 100 MG tablet Take 1  tablet (100 mg total) by mouth daily.  30 tablet  0  . magnesium chloride (SLOW-MAG) 64 MG TBEC Take 1 tablet (64 mg total) by mouth daily.  60 tablet  0  . metoprolol tartrate (LOPRESSOR) 25 MG tablet Take 25 mg by mouth 2 (two) times daily.      . potassium chloride SA (K-DUR,KLOR-CON) 20 MEQ tablet Take 1 tablet (20 mEq total) by mouth 2 (two) times daily.  60 tablet  0  . rosuvastatin (CRESTOR) 10 MG tablet Take 10 mg by mouth daily.        ROS: General: no fevers/chills/night sweats Eyes: no blurry vision, diplopia, or amaurosis ENT: no sore throat or hearing loss Resp: no cough, wheezing, or hemoptysis CV: no edema or palpitations GI: no abdominal pain, nausea, vomiting, diarrhea, or constipation GU: no dysuria, frequency, or hematuria Skin: no rash Neuro: no headache, numbness, tingling, or weakness of extremities Musculoskeletal: no joint pain or swelling Heme: no bleeding, DVT, or easy bruising Endo: no polydipsia or polyuria    Physical Exam: Blood pressure 151/46, pulse 71, temperature 98.5 F (36.9 C), temperature source Oral, resp. rate 18, height 5\' 4"  (1.626 m), weight 58.514 kg (129 lb), SpO2 98.00%.  Pt is alert and oriented, WD, WN, in no distress. HEENT: normal Neck: JVP normal. Carotid upstrokes normal without bruits. No thyromegaly. Lungs: equal expansion, clear bilaterally CV: Apex is discrete and nondisplaced, RRR, S4 gallop present and no murmur Abd: soft, NT, +BS, no bruit, no hepatosplenomegaly Back: no CVA tenderness Ext: no C/C/E        Femoral pulses 2+= without bruits        DP/PT pulses intact and = Skin: warm and dry without rash Neuro: CNII-XII intact             Strength intact = bilaterally  Labs:   Lab Results  Component Value Date   WBC 3.8* 06/24/2011   HGB 15.5* 06/24/2011   HCT 43.5 06/24/2011   MCV 91.8 06/24/2011   PLT 192 06/24/2011    Lab 06/24/11 0638  NA 136  K 3.5  CL 110  CO2 20  BUN 16  CREATININE 0.88  CALCIUM 9.1    PROT 6.1  BILITOT 0.1*  ALKPHOS 103  ALT 28  AST 36  GLUCOSE 50*    Radiology: No results found.  ZOX:WRUE strip on 06/25/11 show transient atrial tachycardia vs A. Flutter with variable AV conduction. 4/21 and 4/22 recurrent Mobitz 2 , 2nd degree AV block with V rate of 28-30/min.  ASSESSMENT AND PLAN:  1. Renovascular hypertension with difficult to control hypertension and need for negative  chronotropic agents to control BP leading to heart block- Symptomatic. 2. PAD 3. DM with recent recurrent hypoglycemia.  Patient with recent recurrent admission to the hospital with altered mental status and difficult to control hypertension. I have reviewed all her admission records and also evaluated her blood pressure.  She has difficulty in controlling her blood pressure again.   4. PVD Patient distances improved significantly since PTA and stenting on 02/16/2010. 02/16/10: Left CIA 9.0x28 Omnilink and REIA 8.0x40 seff expanding Zilver. Right SFA proximal 90% distal 70% and 70% right anterior tibial stenosis.  Left SFA mild disease and left anterior tibial is occluded.Right subclavian artery stent 03/18/2008,  LE art Duplex 03/19/10 RABI 0.85, LABI 0.95. Patent LEIA and LCIA stents. Improved ABI from 0.4 previoulsy on 12/11/09. 5. Bilateral carotid bruit soft left CEA 1990s, and subclavian bruit.    Old stroke with mild residual right arm weakness.   H/O right subclavian stent 03/2008.  Carotid duplex 6.4.12 Mild plaque bilateral carotids. Possible right subclavian stenosis. Recheck one year.  6. Diabetes Mellitus II controlled.  7. Hyperlipidemia, controlled.    8. Tobacco use disorder. 9. Chronic diastolic heart failure  Rec: I have discussed with Dr. Graciela Husbands who has graciously accepted my consult for possible pacemaker implantation. He will evaluate and give his opinion. I do not plan on renal intervention at this time until rhythm issues are cleared.  Will follow. Pamella Pert,  MD 06/28/2011, 9:43 AM

## 2011-06-29 ENCOUNTER — Inpatient Hospital Stay (HOSPITAL_COMMUNITY): Payer: Medicare Other

## 2011-06-29 LAB — GLUCOSE, CAPILLARY: Glucose-Capillary: 124 mg/dL — ABNORMAL HIGH (ref 70–99)

## 2011-06-29 MED ORDER — LOSARTAN POTASSIUM 100 MG PO TABS: 50.0000 mg | ORAL_TABLET | Freq: Every day | ORAL | Status: DC

## 2011-06-29 MED ORDER — PINDOLOL 10 MG PO TABS: 20.0000 mg | ORAL_TABLET | Freq: Two times a day (BID) | ORAL | Status: DC

## 2011-06-29 MED ORDER — CLONIDINE HCL 0.2 MG PO TABS
0.2000 mg | ORAL_TABLET | Freq: Two times a day (BID) | ORAL | Status: DC
  Filled 2011-06-29: qty 1

## 2011-06-29 MED ORDER — HYDRALAZINE HCL 50 MG PO TABS: 25.0000 mg | ORAL_TABLET | Freq: Three times a day (TID) | ORAL | Status: DC

## 2011-06-29 MED ORDER — PINDOLOL 10 MG PO TABS
20.0000 mg | ORAL_TABLET | Freq: Two times a day (BID) | ORAL | Status: DC
  Filled 2011-06-29: qty 2

## 2011-06-29 NOTE — Progress Notes (Signed)
Patient's tele and IV has been discontinued, patient is being discharged. Patient and family verbalizes understanding of discharge instructions___________________________________________________________D. [name] RN

## 2011-06-29 NOTE — Progress Notes (Signed)
   ELECTROPHYSIOLOGY ROUNDING NOTE    Patient Name: Carmen [name] Date of Encounter: 06-29-2011    SUBJECTIVE:Patient feels well.  No chest pain or shortness of breath.  Minimal incisional soreness.  Status post dual chamber PPM yesterday.   TELEMETRY: Reviewed telemetry pt in sinus rhythm with occasional V pacing Filed Vitals:   06/28/11 1900 06/28/11 2129 06/29/11 0119 06/29/11 0617  BP: 180/61 174/58 150/66 146/65  Pulse: 89 84 73 70  Temp:  98.1 F (36.7 C) 97.7 F (36.5 C) 97.7 F (36.5 C)  TempSrc:  Oral Oral Oral  Resp:  18 18 18   Height:      Weight:      SpO2:  93% 98% 98%    Intake/Output Summary (Last 24 hours) at 06/29/11 0856 Last data filed at 06/29/11 0600  Gross per 24 hour  Intake 1337.17 ml  Output   1200 ml  Net 137.17 ml    Radiology/Studies:  Dg Chest 2 View 06/29/2011  *RADIOLOGY REPORT*  Clinical Data: Postop pacemaker, cough  CHEST - 2 VIEW  Comparison: 06/24/2011  Findings: Patchy right lower lobe opacity, suspicious for pneumonia.  No pleural effusion or pneumothorax.  Borderline cardiomegaly.  Left subclavian dual lead pacemaker in satisfactory position.  IMPRESSION: No pneumothorax.  Patchy right lower lobe opacity, suspicious for pneumonia.  Original Report Authenticated By: Charline Bills, M.D.   PHYSICAL EXAM Left chest without hematoma or ecchymosis.  Well developed and nourished in no acute distress HENT normal Neck supple with JVP-flat Clear Regular rate and rhythm, no murmurs or gallops Abd-soft with active BS No Clubbing cyanosis edema Skin-warm and dry A & Oriented  Grossly normal sensory and motor function   DEVICE INTERROGATION: Device interrogated by industry.  Lead values including impedence, sensing, threshold within normal values.    Principal Problem:  *Hypoglycemia Active Problems:  DM (diabetes mellitus), type 2  Renovascular hypertension  Accelerated hypertension  Second-degree heart block    Wound care,  arm mobility, reviewed with patient.    Wound check appointment 07-08-2011 at [facility] with [facility].  Will turn pacemaker care over to Dr Jacinto Halim after that visit.

## 2011-06-29 NOTE — Discharge Summary (Signed)
Physician Discharge Summary  Patient ID: Carmen [name] MRN: [id number] DOB/AGE: December 17, 1933 78 y.o.  Admit date: 06/24/2011 Discharge date: 06/29/2011  Primary Care Physician:  Gwynneth Aliment, MD, MD  Discharge Diagnoses:    .Hypoglycemia resolved, uncontrolled diabetes mellitus  .Renovascular hypertension difficult to control  .Accelerated hypertension .DM (diabetes mellitus), type 2 Mobitz type II block status post pacemaker placement on 06/28/2011  Consults:  Cardiology, Dr. Jacinto Halim                    Electrophysiology, Dr. Sherryl Manges   Discharge Medications: Medication List  As of 06/29/2011  3:20 PM   STOP taking these medications         isosorbide mononitrate 60 MG 24 hr tablet      metoprolol tartrate 25 MG tablet         TAKE these medications         ALPRAZolam 0.5 MG tablet   Commonly known as: XANAX   Take 0.5 mg by mouth daily as needed. For anxiety      aspirin 325 MG tablet   Take 325 mg by mouth daily.      cilostazol 100 MG tablet   Commonly known as: PLETAL   Take 100 mg by mouth 2 (two) times daily.      cloNIDine 0.2 MG tablet   Commonly known as: CATAPRES   Take 0.2 mg by mouth 2 (two) times daily.      furosemide 40 MG tablet   Commonly known as: LASIX   Take 40 mg by mouth daily.      hydrALAZINE 50 MG tablet   Commonly known as: APRESOLINE   Take 0.5 tablets (25 mg total) by mouth 3 (three) times daily.      insulin detemir 100 UNIT/ML injection   Commonly known as: LEVEMIR   Inject 5 Units into the skin 2 (two) times daily.      losartan 100 MG tablet   Commonly known as: COZAAR   Take 0.5 tablets (50 mg total) by mouth daily.      magnesium chloride 64 MG Tbec   Commonly known as: SLOW-MAG   Take 1 tablet (64 mg total) by mouth daily.      pindolol 10 MG tablet   Commonly known as: VISKEN   Take 2 tablets (20 mg total) by mouth 2 (two) times daily.      potassium chloride SA 20 MEQ tablet   Commonly known as:  K-DUR,KLOR-CON   Take 1 tablet (20 mEq total) by mouth 2 (two) times daily.      rosuvastatin 10 MG tablet   Commonly known as: CRESTOR   Take 10 mg by mouth daily.             Brief H and P: For complete details please refer to admission H and P, but in brief, Carmen [name] is a 78 y.o. female with past medical history of renovascular hypertension, diabetes and peripheral vascular diseases. Patient came to the hospital because of lethargy and confusion. Patient reported by family she was hard to arouse this morning and she was not waking up. They checked her blood sugar it was around 29. They called 911, and EMS reported blood sugar of around 60, patient family mentioned that she took her 5 units of levimer insulin this morning. Patient placed on D5W, her blood sugar was lingering around 60 so that was switched to D10 with half-normal saline. Upon initial evaluation emergency  department found to have blood pressure of 200/100. Patient already scheduled for replacement of her renal stent on 07/06/2011   Hospital Course:  Briefly patient is a 78 year old female who was admitted with hypoglycemic episode and accelerated hypertension. She was admitted to the telemetry floor. She was ruled out for acute ACS. hypoglycemia: Resolved, HbA1c was 11.6 with a poor control. Levemir was held during hospitalization and patient was placed on sliding scale and insulin and Lantus. Diabetic coordinator followed the patient through the hospitalization and actually fell that the Levemir dose that the patient was on prior to admission was appropriate. Unclear what led to hypoglycemia, question if the patient had taken an extra dose of insulin at home. Patient had no significant hypoglycemic episodes in the hospital. She actually refused Lantus for a few days. She needs quick followup with her PCP within next 7 days and endocrinology referral for that her control of her diabetes.   Accelerated  hypertension/renovascular hypertension: Difficult to control, patient had multiple adjustments of her antihypertensives during the hospitalization. She was also noticed to have Mobitz type II heart block and hypotension.   patient was seen by Dr. Adline Potter she, who consulted electrophysiology, Dr. Sherryl Manges. Patient had a pacemaker placement on 06/28/2011. Patient will followup with Dr. Newt Lukes on for 30th 2013 for renal stent replacement.    Day of Discharge BP 183/58  Pulse 65  Temp(Src) 98.6 F (37 C) (Oral)  Resp 19  Ht 5\' 4"  (1.626 m)  Wt 57.8 kg (127 lb 6.8 oz)  BMI 21.87 kg/m2  SpO2 98%  Physical Exam: General: Alert and awake oriented x3 not in any acute distress. HEENT: anicteric sclera, pupils reactive to light and accommodation CVS: S1-S2 clear no murmur rubs or gallops Chest: clear to auscultation bilaterally, no wheezing rales or rhonchi Abdomen: soft nontender, nondistended, normal bowel sounds, no organomegaly Extremities: no cyanosis, clubbing or edema noted bilaterally Neuro: Cranial nerves II-XII intact, no focal neurological deficits   The results of significant diagnostics from this hospitalization (including imaging, microbiology, ancillary and laboratory) are listed below for reference.    LAB RESULTS: Basic Metabolic Panel:  Lab 06/24/11 1610  NA 136  K 3.5  CL 110  CO2 20  GLUCOSE 50*  BUN 16  CREATININE 0.88  CALCIUM 9.1  MG 1.9  PHOS --   Liver Function Tests:  Lab 06/24/11 0638  AST 36  ALT 28  ALKPHOS 103  BILITOT 0.1*  PROT 6.1  ALBUMIN 2.9*   CBC:  Lab 06/24/11 0638  WBC 3.8*  NEUTROABS 3.0  HGB 15.5*  HCT 43.5  MCV 91.8  PLT 192   CBG:  Lab 06/29/11 1121 06/29/11 0615  GLUCAP 124* 113*    Significant Diagnostic Studies:  Dg Chest 2 View  06/24/2011  *RADIOLOGY REPORT*  Clinical Data: Hypoglycemia, weakness, previous stroke  CHEST - 2 VIEW  Comparison: Portable chest x-ray of 05/29/2011  Findings: There is cardiomegaly  present. On the lateral view small pleural effusions are present most consistent with mild congestive heart failure.  No focal infiltrate is seen.  No bony abnormality is seen other than diffuse osteopenia.  IMPRESSION:   Probable mild CHF with cardiomegaly and small effusions.  Original Report Authenticated By: Juline Patch, M.D.     Disposition and Follow-up: Discharge Orders    Future Appointments: Provider: Department: Dept Phone: Center:   07/08/2011 2:00 PM [facility] [phone] [facility]     Future Orders Please Complete By  Expires   Diet Carb Modified      Increase activity slowly          DISPOSITION:  home   DIET:  carb modified   ACTIVITY:  as tolerated   DISCHARGE FOLLOW-UP Follow-up Information    Follow up with Gwynneth Aliment, MD. Schedule an appointment as soon as possible for a visit in 7 days. (for hospital follow-up asap for blood sugars and insulin regimen, you need Endocrinology referral )    Contact information:   [address] [place] [address] [phone]       Follow up with Pamella Pert, MD on 07/06/2011. (for follow-up and stent change)    Contact information:   [address]  [place] [address] [phone]          Ttime spent  45 minutes  Signed:  Shawnae Leiva M.D. [facility] 06/29/2011, 3:20 PM

## 2011-06-30 LAB — CULTURE, BLOOD (ROUTINE X 2)
Culture  Setup Time: 201304181310
Culture: NO GROWTH

## 2011-07-02 ENCOUNTER — Encounter: Payer: Self-pay | Admitting: Internal Medicine

## 2011-07-02 ENCOUNTER — Telehealth: Payer: Self-pay | Admitting: Internal Medicine

## 2011-07-02 NOTE — Telephone Encounter (Signed)
Carmen [name] [phone]   Patient had pacer put in on 06/30/11, needs help with remote transmission.  Please return call to granddaughter to advise.

## 2011-07-02 NOTE — Telephone Encounter (Signed)
Spoke with Darreld Mclean Simmons(granddaughter) and explained we would give her a transmitter for remote follow up at her wound check appointment 07/08/11.

## 2011-07-06 ENCOUNTER — Ambulatory Visit (HOSPITAL_COMMUNITY)
Admission: RE | Admit: 2011-07-06 | Discharge: 2011-07-06 | Disposition: A | Discharge status: Home / Self Care | Payer: Medicare Other | Source: Ambulatory Visit | Attending: Cardiology | Admitting: Cardiology

## 2011-07-06 ENCOUNTER — Encounter (HOSPITAL_COMMUNITY)
Admission: RE | Disposition: A | Discharge status: Home / Self Care | Payer: Self-pay | Source: Ambulatory Visit | Attending: Cardiology

## 2011-07-06 DIAGNOSIS — I10 Essential (primary) hypertension: Secondary | ICD-10-CM | POA: Insufficient documentation

## 2011-07-06 DIAGNOSIS — Z8673 Personal history of transient ischemic attack (TIA), and cerebral infarction without residual deficits: Secondary | ICD-10-CM | POA: Insufficient documentation

## 2011-07-06 DIAGNOSIS — I70209 Unspecified atherosclerosis of native arteries of extremities, unspecified extremity: Secondary | ICD-10-CM | POA: Insufficient documentation

## 2011-07-06 DIAGNOSIS — E119 Type 2 diabetes mellitus without complications: Secondary | ICD-10-CM | POA: Insufficient documentation

## 2011-07-06 DIAGNOSIS — Y849 Medical procedure, unspecified as the cause of abnormal reaction of the patient, or of later complication, without mention of misadventure at the time of the procedure: Secondary | ICD-10-CM | POA: Insufficient documentation

## 2011-07-06 DIAGNOSIS — I2510 Atherosclerotic heart disease of native coronary artery without angina pectoris: Secondary | ICD-10-CM | POA: Insufficient documentation

## 2011-07-06 HISTORY — PX: RENAL ANGIOGRAM: SHX5509

## 2011-07-06 HISTORY — PX: ABDOMINAL ANGIOGRAM: SHX5499

## 2011-07-06 LAB — POCT I-STAT, CHEM 8
BUN: 8 mg/dL (ref 6–23)
Creatinine, Ser: 0.9 mg/dL (ref 0.50–1.10)
Sodium: 140 mEq/L (ref 135–145)
TCO2: 25 mmol/L (ref 0–100)

## 2011-07-06 SURGERY — ABDOMINAL ANGIOGRAM
Anesthesia: LOCAL | Laterality: Right

## 2011-07-06 MED ORDER — ACETAMINOPHEN 325 MG PO TABS
650.0000 mg | ORAL_TABLET | ORAL | Status: DC | PRN
  Administered 2011-07-06: 650 mg via ORAL
  Filled 2011-07-06 (×2): qty 2

## 2011-07-06 MED ORDER — ACETAMINOPHEN 325 MG PO TABS: 650.0000 mg | ORAL_TABLET | ORAL | Status: DC | PRN

## 2011-07-06 MED ORDER — HYDRALAZINE HCL 20 MG/ML IJ SOLN
10.0000 mg | Freq: Once | INTRAMUSCULAR | Status: AC
  Administered 2011-07-06: 10 mg via INTRAVENOUS

## 2011-07-06 MED ORDER — HEPARIN SODIUM (PORCINE) 1000 UNIT/ML IJ SOLN
INTRAMUSCULAR | Status: AC
  Filled 2011-07-06: qty 1

## 2011-07-06 MED ORDER — ONDANSETRON HCL 4 MG/2ML IJ SOLN: 4.0000 mg | Freq: Four times a day (QID) | INTRAMUSCULAR | Status: DC | PRN

## 2011-07-06 MED ORDER — LABETALOL HCL 5 MG/ML IV SOLN
INTRAVENOUS | Status: AC
  Filled 2011-07-06: qty 4

## 2011-07-06 MED ORDER — HEPARIN (PORCINE) IN NACL 2-0.9 UNIT/ML-% IJ SOLN
INTRAMUSCULAR | Status: AC
  Filled 2011-07-06: qty 1000

## 2011-07-06 MED ORDER — LIDOCAINE HCL (PF) 1 % IJ SOLN
INTRAMUSCULAR | Status: AC
  Filled 2011-07-06: qty 30

## 2011-07-06 MED ORDER — LABETALOL HCL 5 MG/ML IV SOLN: 15.0000 mg | Freq: Once | INTRAVENOUS | Status: DC

## 2011-07-06 MED ORDER — HYDROCODONE-ACETAMINOPHEN 5-325 MG PO TABS: 2.0000 | ORAL_TABLET | Freq: Once | ORAL | Status: DC

## 2011-07-06 MED ORDER — HYDROCODONE-ACETAMINOPHEN 5-325 MG PO TABS
2.0000 | ORAL_TABLET | Freq: Once | ORAL | Status: AC
  Administered 2011-07-06: 1 via ORAL
  Filled 2011-07-06: qty 1

## 2011-07-06 MED ORDER — HYDRALAZINE HCL 20 MG/ML IJ SOLN
INTRAMUSCULAR | Status: AC
Start: 2011-07-06 — End: 2011-07-06
  Filled 2011-07-06: qty 1

## 2011-07-06 MED ORDER — LABETALOL HCL 5 MG/ML IV SOLN
20.0000 mg | Freq: Once | INTRAVENOUS | Status: AC
  Administered 2011-07-06 (×2): 10 mg via INTRAVENOUS

## 2011-07-06 MED ORDER — SODIUM CHLORIDE 0.9 % IV SOLN
INTRAVENOUS | Status: DC
  Administered 2011-07-06: 06:00:00 via INTRAVENOUS

## 2011-07-06 MED ORDER — SODIUM CHLORIDE 0.9 % IV SOLN: 1.0000 mL/kg/h | INTRAVENOUS | Status: DC

## 2011-07-06 NOTE — Discharge Instructions (Signed)

## 2011-07-06 NOTE — Interval H&P Note (Signed)
History and Physical Interval Note:  07/06/2011 7:48 AM  Carmen [name]  has presented today for surgery, with the diagnosis of claudication/renal vascular hp  The various methods of treatment have been discussed with the patient and family. After consideration of risks, benefits and other options for treatment, the patient has consented to  Procedure(s) (LRB): ABDOMINAL ANGIOGRAM (N/A) RENAL ANGIOGRAM (N/A) as a surgical intervention .  The patients' history has been reviewed, patient examined, no change in status, stable for surgery.  I have reviewed the patients' chart and labs.  Questions were answered to the patient's satisfaction.     [facility] R  Patient scheduled for abdominal and LE arteriogram and possible angioplasty

## 2011-07-06 NOTE — H&P (View-Only) (Signed)
CARDIOLOGY CONSULT NOTE  Patient ID: Carmen [name] MRN: [id number] DOB/AGE: 06/07/1933 78 y.o.  Admit date: 06/24/2011 Referring Physician G. Lama Primary Physician: SANDERS,ROBYN N, MD, MD Reason for Consultation Heart Block  HPI: Patient is well known to me and is 78 years of age. She has secondary hypertension. She has known renal artery stent that was performed previously and has had instent restenosis. She has had balloon angioplasty for the instent restenosis. Recently she has been having recurrence of uncontrolled hypertension. I was planning on performing renal angiography in the outpatient setting. However, the procedure has been delayed due to her recurrent admissions to the hospital with non-hypertension related issues including altered mental status, hypoglycemia. Patient has had several episodes of second-degree AV block and was felt to be due to medications. Again this admission she was on Catapres 0.2 mg by mouth twice a day and metoprolol 25 mg by mouth daily and again developed transient third-degree AV block and persistent Mobitz type II second-degree AV block. Patient was symptomatic with heart cath with heart rates dropping to 20-30 per minute. We had reduced Catapres to 0.1 mg by mouth twice a day and discontinued the Toprol I switched her over to Pindalol and this morning patient had Mobitz type 2 second degree AV block again. Give her advanced age I feel comment pacemaker implantation was indicated. And seeing her vallate for the same.  Past Medical History  Diagnosis Date  . Diabetes mellitus   . Hypertension   . Coronary artery disease   . Renal disorder   . Herniated lumbar intervertebral disc   . Cataract     cataract surgery scheduled for 03/31/11, 2 - left eye, 1 - right eye  . Renovascular hypertension      s/p left renal artery stent 12/2007.   S/P balloon angioplasty on 02/16/10 for ISR, BP was  controlled well since then.     . Claudication in peripheral  vascular disease     02/16/10: Left CIA 9.0x28 Omnilink and REIA 8.0x40 seff expanding Zilver.   right subclavian artery stent 03/18/2008,  . S/P carotid endarterectomy      left carotid endarterectomy  1991; Stroke and TIA in 1999 with right sided weakness, now with residual right arm weakness  . Stroke     TIA  . Peripheral vascular disease   . Anxiety      Past Surgical History  Procedure Date  . Appendectomy   . Abdominal hysterectomy   . Ureteral stent placement   . Carotid endarterectomy   . Laminectomy   . Back surgery      History reviewed. No pertinent family history.  Social History: History   Social History  . Marital Status: Married    Spouse Name: N/A    Number of Children: N/A  . Years of Education: N/A   Occupational History  . Not on file.   Social History Main Topics  . Smoking status: Current Everyday Smoker -- 0.5 packs/day for 65 years    Types: Cigarettes  . Smokeless tobacco: Never Used  . Alcohol Use: No  . Drug Use: No  . Sexually Active: No   Other Topics Concern  . Not on file   Social History Narrative  . No narrative on file     Prescriptions prior to admission  Medication Sig Dispense Refill  . ALPRAZolam (XANAX) 0.5 MG tablet Take 0.5 mg by mouth daily as needed. For anxiety      . aspirin 325 MG   tablet Take 325 mg by mouth daily.      . cilostazol (PLETAL) 100 MG tablet Take 100 mg by mouth 2 (two) times daily.      . cloNIDine (CATAPRES) 0.2 MG tablet Take 0.2 mg by mouth 2 (two) times daily.      . furosemide (LASIX) 40 MG tablet Take 40 mg by mouth daily.      . hydrALAZINE (APRESOLINE) 50 MG tablet Take 1 tablet (50 mg total) by mouth 4 (four) times daily.  120 tablet  0  . insulin detemir (LEVEMIR) 100 UNIT/ML injection Inject 5 Units into the skin 2 (two) times daily.      . isosorbide mononitrate (IMDUR) 60 MG 24 hr tablet Take 1 tablet (60 mg total) by mouth daily.  30 tablet  0  . losartan (COZAAR) 100 MG tablet Take 1  tablet (100 mg total) by mouth daily.  30 tablet  0  . magnesium chloride (SLOW-MAG) 64 MG TBEC Take 1 tablet (64 mg total) by mouth daily.  60 tablet  0  . metoprolol tartrate (LOPRESSOR) 25 MG tablet Take 25 mg by mouth 2 (two) times daily.      . potassium chloride SA (K-DUR,KLOR-CON) 20 MEQ tablet Take 1 tablet (20 mEq total) by mouth 2 (two) times daily.  60 tablet  0  . rosuvastatin (CRESTOR) 10 MG tablet Take 10 mg by mouth daily.        ROS: General: no fevers/chills/night sweats Eyes: no blurry vision, diplopia, or amaurosis ENT: no sore throat or hearing loss Resp: no cough, wheezing, or hemoptysis CV: no edema or palpitations GI: no abdominal pain, nausea, vomiting, diarrhea, or constipation GU: no dysuria, frequency, or hematuria Skin: no rash Neuro: no headache, numbness, tingling, or weakness of extremities Musculoskeletal: no joint pain or swelling Heme: no bleeding, DVT, or easy bruising Endo: no polydipsia or polyuria    Physical Exam: Blood pressure 151/46, pulse 71, temperature 98.5 F (36.9 C), temperature source Oral, resp. rate 18, height 5' 4" (1.626 m), weight 58.514 kg (129 lb), SpO2 98.00%.  Pt is alert and oriented, WD, WN, in no distress. HEENT: normal Neck: JVP normal. Carotid upstrokes normal without bruits. No thyromegaly. Lungs: equal expansion, clear bilaterally CV: Apex is discrete and nondisplaced, RRR, S4 gallop present and no murmur Abd: soft, NT, +BS, no bruit, no hepatosplenomegaly Back: no CVA tenderness Ext: no C/C/E        Femoral pulses 2+= without bruits        DP/PT pulses intact and = Skin: warm and dry without rash Neuro: CNII-XII intact             Strength intact = bilaterally  Labs:   Lab Results  Component Value Date   WBC 3.8* 06/24/2011   HGB 15.5* 06/24/2011   HCT 43.5 06/24/2011   MCV 91.8 06/24/2011   PLT 192 06/24/2011    Lab 06/24/11 0638  NA 136  K 3.5  CL 110  CO2 20  BUN 16  CREATININE 0.88  CALCIUM 9.1    PROT 6.1  BILITOT 0.1*  ALKPHOS 103  ALT 28  AST 36  GLUCOSE 50*    Radiology: No results found.  EKG:Tele strip on 06/25/11 show transient atrial tachycardia vs A. Flutter with variable AV conduction. 4/21 and 4/22 recurrent Mobitz 2 , 2nd degree AV block with V rate of 28-30/min.  ASSESSMENT AND PLAN:  1. Renovascular hypertension with difficult to control hypertension and need for negative   chronotropic agents to control BP leading to heart block- Symptomatic. 2. PAD 3. DM with recent recurrent hypoglycemia.  Patient with recent recurrent admission to the hospital with altered mental status and difficult to control hypertension. I have reviewed all her admission records and also evaluated her blood pressure.  She has difficulty in controlling her blood pressure again.   4. PVD Patient distances improved significantly since PTA and stenting on 02/16/2010. 02/16/10: Left CIA 9.0x28 Omnilink and REIA 8.0x40 seff expanding Zilver. Right SFA proximal 90% distal 70% and 70% right anterior tibial stenosis.  Left SFA mild disease and left anterior tibial is occluded.Right subclavian artery stent 03/18/2008,  LE art Duplex 03/19/10 RABI 0.85, LABI 0.95. Patent LEIA and LCIA stents. Improved ABI from 0.4 previoulsy on 12/11/09. 5. Bilateral carotid bruit soft left CEA 1990s, and subclavian bruit.    Old stroke with mild residual right arm weakness.   H/O right subclavian stent 03/2008.  Carotid duplex 6.4.12 Mild plaque bilateral carotids. Possible right subclavian stenosis. Recheck one year.  6. Diabetes Mellitus II controlled.  7. Hyperlipidemia, controlled.    8. Tobacco use disorder. 9. Chronic diastolic heart failure  Rec: I have discussed with Dr. Klein who has graciously accepted my consult for possible pacemaker implantation. He will evaluate and give his opinion. I do not plan on renal intervention at this time until rhythm issues are cleared.  Will follow. Hagan Maltz,JAGADEESH R,  MD 06/28/2011, 9:43 AM  

## 2011-07-06 NOTE — CV Procedure (Signed)
Procedure performed:   1. Abdominal aortogram 2. Selective right and left iliac arteriogram  3. PTA and stenting of the in-stent restenotic right external iliac artery where 6.0 x 20 mm [place] balloon for in-stent restenosis.  Indication patient is a 78 year old African American female with history of difficult to control hypertension, renovascular hypertension, peripheral arterial disease with history of stenting of right and left iliac arteries in the past in 2011. Patient has a 9 x 28 mm left common iliac artery stent a 8 x 40 mm right external iliac artery stent. She also has diffuse superficial femoral artery disease bilaterally. This is being managed medically. She is being admitted to the hospital multiple times with uncontrolled hypertension. She is now brought to the vascular lab to evaluate her renal anatomy. She has known left carotid and that was placed remotely and has undergone balloon angioplasty for in-stent restenosis in December of 2011.  Angiographic data  Abdominal aortogram: Severe atherosclerotic changes and diffuse calcification of the abdominal aorta. There are 2 renal arteries one on either side. The left liver artery stent appears to have instent restenosis.  The right common iliac artery is widely patent. The right external iliac artery stent was placed previously had a high-grade focal 90% stenosis. It was occlusive artery 6 French sheath was introduced. Left iliac artery was widely patent. The stent was widely patent.  Selective left renal and right renal arteriogram: The renal arteries are widely patent including the left renal stent. No damping with deep throating the vessel.   Impression: Successful PTA and balloon angioplasty of the in-stent restenotic right external ear cautery. A 6 x 20 mm Fox[place] balloon was utilized to perform balloon and plasty at a peak of 16 atmospheric pressure. Stenosis was reduced to 0%.  Recommendations: Patient will be discharged home  today with outpatient followup. She also has a permanent pacemaker implanted last week and we can certainly uptitrate her beta blockers without the risk of causing significant bradycardia. Patient tolerated procedure well. No immediate complications in the lab.   Technical procedure: Using the 6 French right femoral arterial access under fluoroscopic guidance a 6 French sheath was introduced into the right common watery. A short pigtail catheter was utilized to perform abdominal aortogram and also to selectively visualize right-to-left iliac arteries.  6000 units of intravenous heparin was administered to the patient. The right lower arteriogram was performed through the arterial access sheath and in-stent restenosis was confirmed in the right external iliac artery. This was ballooned using the above several between 8 and 16 at the pressure and eventually 60 atmospheric pressure for 90 seconds. Excellent results were. I then advanced a 5 [place] LIMA catheter to engage the left and right renal arteries selectively and angiography was performed. Hemodynamic monitoring was also performed. The catheter was then pulled out of the body over versacore wire. The cath exchanges were performed over the same wire. Balloon and plasty was also performed over the same wire maintaining the right femoral arterial access.Marland Kitchen

## 2011-07-08 ENCOUNTER — Ambulatory Visit: Payer: Medicare Other

## 2011-07-15 ENCOUNTER — Ambulatory Visit: Payer: Medicare Other

## 2011-07-20 ENCOUNTER — Emergency Department (HOSPITAL_COMMUNITY): Payer: Medicare Other

## 2011-07-20 ENCOUNTER — Encounter (HOSPITAL_COMMUNITY): Payer: Self-pay | Admitting: *Deleted

## 2011-07-20 ENCOUNTER — Emergency Department (HOSPITAL_COMMUNITY)
Admission: EM | Admit: 2011-07-20 | Discharge: 2011-07-20 | Disposition: A | Discharge status: Home / Self Care | Payer: Medicare Other | Attending: Emergency Medicine | Admitting: Emergency Medicine

## 2011-07-20 DIAGNOSIS — Z8673 Personal history of transient ischemic attack (TIA), and cerebral infarction without residual deficits: Secondary | ICD-10-CM | POA: Insufficient documentation

## 2011-07-20 DIAGNOSIS — I10 Essential (primary) hypertension: Secondary | ICD-10-CM | POA: Insufficient documentation

## 2011-07-20 DIAGNOSIS — Z950 Presence of cardiac pacemaker: Secondary | ICD-10-CM | POA: Insufficient documentation

## 2011-07-20 DIAGNOSIS — E119 Type 2 diabetes mellitus without complications: Secondary | ICD-10-CM | POA: Insufficient documentation

## 2011-07-20 DIAGNOSIS — R739 Hyperglycemia, unspecified: Secondary | ICD-10-CM

## 2011-07-20 DIAGNOSIS — F17200 Nicotine dependence, unspecified, uncomplicated: Secondary | ICD-10-CM | POA: Insufficient documentation

## 2011-07-20 DIAGNOSIS — R110 Nausea: Secondary | ICD-10-CM | POA: Insufficient documentation

## 2011-07-20 DIAGNOSIS — E876 Hypokalemia: Secondary | ICD-10-CM | POA: Insufficient documentation

## 2011-07-20 DIAGNOSIS — Z794 Long term (current) use of insulin: Secondary | ICD-10-CM | POA: Insufficient documentation

## 2011-07-20 DIAGNOSIS — I2510 Atherosclerotic heart disease of native coronary artery without angina pectoris: Secondary | ICD-10-CM | POA: Insufficient documentation

## 2011-07-20 LAB — BASIC METABOLIC PANEL
BUN: 15 mg/dL (ref 6–23)
CO2: 27 mEq/L (ref 19–32)
Chloride: 96 mEq/L (ref 96–112)
Creatinine, Ser: 0.89 mg/dL (ref 0.50–1.10)
Glucose, Bld: 423 mg/dL — ABNORMAL HIGH (ref 70–99)

## 2011-07-20 LAB — URINALYSIS, ROUTINE W REFLEX MICROSCOPIC
Bilirubin Urine: NEGATIVE
Glucose, UA: 500 mg/dL — AB
Ketones, ur: NEGATIVE mg/dL
Specific Gravity, Urine: 1.015 (ref 1.005–1.030)
pH: 5.5 (ref 5.0–8.0)

## 2011-07-20 LAB — URINE MICROSCOPIC-ADD ON

## 2011-07-20 LAB — CBC
HCT: 41.9 % (ref 36.0–46.0)
Hemoglobin: 15.1 g/dL — ABNORMAL HIGH (ref 12.0–15.0)
MCHC: 36 g/dL (ref 30.0–36.0)
RBC: 4.62 MIL/uL (ref 3.87–5.11)
WBC: 4.2 10*3/uL (ref 4.0–10.5)

## 2011-07-20 LAB — DIFFERENTIAL
Lymphocytes Relative: 30 % (ref 12–46)
Lymphs Abs: 1.3 10*3/uL (ref 0.7–4.0)
Monocytes Absolute: 0.2 10*3/uL (ref 0.1–1.0)
Monocytes Relative: 5 % (ref 3–12)
Neutro Abs: 2.7 10*3/uL (ref 1.7–7.7)

## 2011-07-20 LAB — GLUCOSE, CAPILLARY
Glucose-Capillary: 394 mg/dL — ABNORMAL HIGH (ref 70–99)
Glucose-Capillary: 336 mg/dL — ABNORMAL HIGH (ref 70–99)
Glucose-Capillary: 312 mg/dL — ABNORMAL HIGH (ref 70–99)

## 2011-07-20 MED ORDER — HYDRALAZINE HCL 25 MG PO TABS
25.0000 mg | ORAL_TABLET | ORAL | Status: AC
  Administered 2011-07-20: 25 mg via ORAL
  Filled 2011-07-20 (×2): qty 1

## 2011-07-20 MED ORDER — SODIUM CHLORIDE 0.9 % IV BOLUS (SEPSIS)
500.0000 mL | Freq: Once | INTRAVENOUS | Status: AC
  Administered 2011-07-20: 500 mL via INTRAVENOUS

## 2011-07-20 MED ORDER — POTASSIUM CHLORIDE CRYS ER 20 MEQ PO TBCR
40.0000 meq | EXTENDED_RELEASE_TABLET | Freq: Once | ORAL | Status: AC
  Administered 2011-07-20: 40 meq via ORAL
  Filled 2011-07-20: qty 2

## 2011-07-20 MED ORDER — INSULIN ASPART 100 UNIT/ML ~~LOC~~ SOLN
5.0000 [IU] | Freq: Once | SUBCUTANEOUS | Status: AC
  Administered 2011-07-20: 5 [IU] via SUBCUTANEOUS
  Filled 2011-07-20: qty 1

## 2011-07-20 NOTE — ED Notes (Signed)
Pt and family states that they would like to wait to do the in and out cath. Pt has attempted to use the bathroom however she was not successful. Pt is ambulatory with minimal asst.

## 2011-07-20 NOTE — ED Provider Notes (Signed)
History    Scribed for Carmen Numbers, MD, the patient was seen in room STRE7/STRE7. This chart was scribed by Carmen [name].   CSN: [id number]  Arrival date & time 07/20/11  1244   First MD Initiated Contact with Patient 07/20/11 1304      Chief Complaint  Patient presents with  . Diabetes    (Consider location/radiation/quality/duration/timing/severity/associated sxs/prior treatment) HPI Carmen Numbers, MD entered patient's room at 1:26 PM   Carmen [name] is a 78 y.o. female with diabetes mellitus presents to the Emergency Department for fluctuating blood sugars.  Patients blood sugar has been in 30s around 0645 today. Patient last ate around 7am. Patient took 10 units at 0900.  Patient states sugar jumped to 540 after drinking orange juice which was why she gave herself levemir despite earlier low blood sugar.  Symptoms are associated with nausea yesterday.  Symptoms are not associated with cough, shortness of breath, chest pain, urinary symptoms, vomiting and abdominal pain.   Patient was discharged from hospital several weeks ago.  Reports blood sugar has been fluctuating since her insulin dosage was changed to 5 units am and 5 units pm.   Patient states she feels that she is not getting enough insulin.  There are no other associated or modifying factors.    PCP Carmen Aliment, MD, MD     Past Medical History  Diagnosis Date  . Diabetes mellitus   . Hypertension   . Coronary artery disease   . Renal disorder   . Herniated lumbar intervertebral disc   . Cataract     cataract surgery scheduled for 03/31/11, 2 - left eye, 1 - right eye  . Renovascular hypertension      s/p left renal artery stent 12/2007.   S/P balloon angioplasty on 02/16/10 for ISR, BP was  controlled well since then.     . Claudication in peripheral vascular disease     02/16/10: Left CIA 9.0x28 Omnilink and REIA 8.0x40 seff expanding [place].   right subclavian artery stent 03/18/2008,  . S/P carotid  endarterectomy      left carotid endarterectomy  1991; Stroke and TIA in 1999 with right sided weakness, now with residual right arm weakness  . Stroke     TIA  . Peripheral vascular disease   . Anxiety     Past Surgical History  Procedure Date  . Appendectomy   . Abdominal hysterectomy   . Ureteral stent placement   . Carotid endarterectomy   . Laminectomy   . Back surgery     History reviewed. No pertinent family history.  History  Substance Use Topics  . Smoking status: Current Everyday Smoker -- 0.5 packs/day for 65 years    Types: Cigarettes  . Smokeless tobacco: Never Used  . Alcohol Use: No    OB History    Grav Para Term Preterm Abortions TAB SAB Ect Mult Living                  Review of Systems  Constitutional: Negative.   HENT: Negative.   Eyes: Negative.   Respiratory: Negative.   Cardiovascular: Negative.   Gastrointestinal: Positive for nausea.  Genitourinary: Negative.  Negative for dysuria and frequency.  Musculoskeletal: Negative.   Skin: Negative.   Neurological: Negative.   Hematological: Negative.   Psychiatric/Behavioral: Negative.   All other systems reviewed and are negative.  Remaining review of systems negative except as noted in the HPI.    Allergies  Norvasc and Peanut-containing  drug products  Home Medications   Current Outpatient Rx  Name Route Sig Dispense Refill  . ALPRAZOLAM 0.5 MG PO TABS Oral Take 0.5 mg by mouth daily as needed. For anxiety    . ASPIRIN 325 MG PO TABS Oral Take 325 mg by mouth daily.    Marland Kitchen CILOSTAZOL 100 MG PO TABS Oral Take 100 mg by mouth 2 (two) times daily.    Marland Kitchen CLONIDINE HCL 0.2 MG PO TABS Oral Take 0.2 mg by mouth 2 (two) times daily.    . FUROSEMIDE 40 MG PO TABS Oral Take 40 mg by mouth daily.    Marland Kitchen HYDRALAZINE HCL 50 MG PO TABS Oral Take 25 mg by mouth 3 (three) times daily.    . INSULIN DETEMIR 100 UNIT/ML [place] SOLN Subcutaneous Inject 5 Units into the skin 2 (two) times daily.    Marland Kitchen LOSARTAN  POTASSIUM 100 MG PO TABS Oral Take 50 mg by mouth daily.    Marland Kitchen MAGNESIUM CHLORIDE 64 MG PO TBEC Oral Take 1 tablet by mouth daily.    Marland Kitchen PINDOLOL 10 MG PO TABS Oral Take 20 mg by mouth 2 (two) times daily.    Marland Kitchen POTASSIUM CHLORIDE CRYS ER 20 MEQ PO TBCR Oral Take 20 mEq by mouth 2 (two) times daily.    Marland Kitchen ROSUVASTATIN CALCIUM 10 MG PO TABS Oral Take 10 mg by mouth daily.    Marland Kitchen LOSARTAN POTASSIUM 100 MG PO TABS Oral Take 0.5 tablets (50 mg total) by mouth daily. 30 tablet 3    BP 217/88  Pulse 6  Temp(Src) 98.6 F (37 C) (Oral)  Resp 16  SpO2 97%  Physical Exam  Nursing note and vitals reviewed. Constitutional: She is oriented to person, place, and time. She appears well-developed and well-nourished. No distress.  HENT:  Head: Normocephalic and atraumatic.  Eyes: Conjunctivae and EOM are normal.  Neck: Neck supple. No tracheal deviation present.  Cardiovascular: Normal rate and regular rhythm.   Pulmonary/Chest: Effort normal and breath sounds normal. No respiratory distress.  Musculoskeletal: Normal range of motion.  Neurological: She is alert and oriented to person, place, and time. Cranial nerve deficit: cranial nerves 2-12 grossly intact   Skin: Skin is warm and dry.  Psychiatric: She has a normal mood and affect. Her behavior is normal.    ED Course  Procedures (including critical care time)   DIAGNOSTIC STUDIES: Oxygen Saturation is 97% on room air, normal by my interpretation.     COORDINATION OF CARE: 1:38 PM  Physical exam complete.  Will recheck blood sugar.   2:30 PM  Potassium chloride ordered.  3:30  PM  Apresoline and IV fluids bolus ordered.    4:45 PM  Blood sugar 312.  Insulin ordered.      LABS / RADIOLOGY:   Labs Reviewed  GLUCOSE, CAPILLARY - Abnormal; Notable for the following:    Glucose-Capillary 394 (*)    All other components within normal limits  CBC - Abnormal; Notable for the following:    Hemoglobin 15.1 (*)    RDW 16.5 (*)    Platelets  127 (*)    All other components within normal limits  BASIC METABOLIC PANEL - Abnormal; Notable for the following:    Potassium 2.9 (*) HEMOLYSIS AT THIS LEVEL MAY AFFECT RESULT   Glucose, Bld 423 (*)    GFR calc non Af Amer 62 (*)    GFR calc Af Amer 72 (*)    All other components within normal limits  URINALYSIS, ROUTINE W REFLEX MICROSCOPIC - Abnormal; Notable for the following:    Glucose, UA 500 (*)    Protein, ur 100 (*)    All other components within normal limits  GLUCOSE, CAPILLARY - Abnormal; Notable for the following:    Glucose-Capillary 336 (*)    All other components within normal limits  GLUCOSE, CAPILLARY - Abnormal; Notable for the following:    Glucose-Capillary 312 (*)    All other components within normal limits  URINE MICROSCOPIC-ADD ON - Abnormal; Notable for the following:    Squamous Epithelial / LPF FEW (*)    Casts GRANULAR CAST (*) HYALINE CASTS   Crystals CA OXALATE CRYSTALS (*)    All other components within normal limits  GLUCOSE, CAPILLARY - Abnormal; Notable for the following:    Glucose-Capillary 231 (*)    All other components within normal limits  DIFFERENTIAL   Dg Chest 2 View  07/20/2011  *RADIOLOGY REPORT*  Clinical Data: Diabetes, hypertension  CHEST - 2 VIEW  Comparison: Chest x-ray of 06/29/2011  Findings: Linear atelectasis or scarring is noted at the right lung base.  No active infiltrate or effusion is seen.  Cardiomegaly is stable.  A permanent pacemaker remains.  No acute bony abnormality is seen.  IMPRESSION: Stable cardiomegaly with permanent pacemaker.  Linear atelectasis or scarring at the right lung base.  Original Report Authenticated By: Juline Patch, M.D.         MDM  Patient was evaluated by myself.  She was hyperglycemic to 394 here.  Work-up for fluctuating glucose ordered including CXR, UA, CBC, and BMP were performed.  Patient had no PNA, no UTI, no renal compromise, and no leukocytosis.  Received a 500 cc NS bolus  here.  Patient had improvement of glucose over several hours.  Family confirmed that the patient was definitely hypoglycemic to 30s this AM.  She has not developed repeat episode of this here.  Patient assured me she had only used 5 units of insulin last night.  She is supposed to follow-up with her PCP tomorrow.  She was instructed to only use 2.5 units of her levemir tonight to avoid another episode of hypoglycemia.  Patient was discharged in good condition with family.          MEDICATIONS GIVEN IN THE E.D. Scheduled Meds:    . hydrALAZINE  25 mg Oral To Minor  . insulin aspart  5 Units Subcutaneous Once  . potassium chloride  40 mEq Oral Once  . sodium chloride  500 mL Intravenous Once   Continuous Infusions:      IMPRESSION: 1. Hyperglycemia without ketosis   2. Hypokalemia      NEW MEDICATIONS: New Prescriptions   No medications on file      I personally performed the services described in this documentation, which was scribed in my presence. The recorded information has been reviewed and considered.        Carmen Numbers, MD 07/20/11 [phone]

## 2011-07-20 NOTE — Discharge Instructions (Signed)
Hyperglycemia Hyperglycemia occurs when the glucose (sugar) in your blood is too high. Hyperglycemia can happen for many reasons, but it most often happens to people who do not know they have diabetes or are not managing their diabetes properly.  CAUSES  Whether you have diabetes or not, there are other causes of hyperglycemia. Hyperglycemia can occur when you have diabetes, but it can also occur in other situations that you might not be as aware of, such as: Diabetes  If you have diabetes and are having problems controlling your blood glucose, hyperglycemia could occur because of some of the following reasons:   Not following your meal plan.   Not taking your diabetes medications or not taking it properly.   Exercising less or doing less activity than you normally do.   Being sick.  Pre-diabetes  This cannot be ignored. Before people develop Type 2 diabetes, they almost always have "pre-diabetes." This is when your blood glucose levels are higher than normal, but not yet high enough to be diagnosed as diabetes. Research has shown that some long-term damage to the body, especially the heart and circulatory system, may already be occurring during pre-diabetes. If you take action to manage your blood glucose when you have pre-diabetes, you may delay or prevent Type 2 diabetes from developing.  Stress  If you have diabetes, you may be "diet" controlled or on oral medications or insulin to control your diabetes. However, you may find that your blood glucose is higher than usual in the hospital whether you have diabetes or not. This is often referred to as "stress hyperglycemia." Stress can elevate your blood glucose. This happens because of hormones put out by the body during times of stress. If stress has been the cause of your high blood glucose, it can be followed regularly by your caregiver. That way he/she can make sure your hyperglycemia does not continue to get worse or progress to diabetes.    Steroids  Steroids are medications that act on the infection fighting system (immune system) to block inflammation or infection. One side effect can be a rise in blood glucose. Most people can produce enough extra insulin to allow for this rise, but for those who cannot, steroids make blood glucose levels go even higher. It is not unusual for steroid treatments to "uncover" diabetes that is developing. It is not always possible to determine if the hyperglycemia will go away after the steroids are stopped. A special blood test called an A1c is sometimes done to determine if your blood glucose was elevated before the steroids were started.  SYMPTOMS  Thirsty.   Frequent urination.   Dry mouth.   Blurred vision.   Tired or fatigue.   Weakness.   Sleepy.   Tingling in feet or leg.  DIAGNOSIS  Diagnosis is made by monitoring blood glucose in one or all of the following ways:  A1c test. This is a chemical found in your blood.   Fingerstick blood glucose monitoring.   Laboratory results.  TREATMENT  First, knowing the cause of the hyperglycemia is important before the hyperglycemia can be treated. Treatment may include, but is not be limited to:  Education.   Change or adjustment in medications.   Change or adjustment in meal plan.   Treatment for an illness, infection, etc.   More frequent blood glucose monitoring.   Change in exercise plan.   Decreasing or stopping steroids.   Lifestyle changes.  HOME CARE INSTRUCTIONS   Test your blood glucose  as directed.   Exercise regularly. Your caregiver will give you instructions about exercise. Pre-diabetes or diabetes which comes on with stress is helped by exercising.   Eat wholesome, balanced meals. Eat often and at regular, fixed times. Your caregiver or nutritionist will give you a meal plan to guide your sugar intake.   Being at an ideal weight is important. If needed, losing as little as 10 to 15 pounds may help  improve blood glucose levels.  SEEK MEDICAL CARE IF:   You have questions about medicine, activity, or diet.   You continue to have symptoms (problems such as increased thirst, urination, or weight gain).  SEEK IMMEDIATE MEDICAL CARE IF:   You are vomiting or have diarrhea.   Your breath smells fruity.   You are breathing faster or slower.   You are very sleepy or incoherent.   You have numbness, tingling, or pain in your feet or hands.   You have chest pain.   Your symptoms get worse even though you have been following your caregiver's orders.   If you have any other questions or concerns.  Document Released: 08/18/2000 Document Revised: 02/11/2011 Document Reviewed: 10/14/2008 [facility] Patient Information 2012 [place], [place].Hypokalemia Hypokalemia means a low potassium level in the blood.Potassium is an electrolyte that helps regulate the amount of fluid in the body. It also stimulates muscle contraction and maintains a stable acid-base balance.Most of the body's potassium is inside of cells, and only a very small amount is in the blood. Because the amount in the blood is so small, minor changes can have big effects. PREPARATION FOR TEST Testing for potassium requires taking a blood sample taken by needle from a vein in the arm. The skin is cleaned thoroughly before the sample is drawn. There is no other special preparation needed. NORMAL VALUES Potassium levels below 3.5 mEq/L are abnormally low. Levels above 5.1 mEq/L are abnormally high. Ranges for normal findings may vary among different laboratories and hospitals. You should always check with your doctor after having lab work or other tests done to discuss the meaning of your test results and whether your values are considered within normal limits. MEANING OF TEST  Your caregiver will go over the test results with you and discuss the importance and meaning of your results, as well as treatment options and the need for  additional tests, if necessary. A potassium level is frequently part of a routine medical exam. It is usually included as part of a whole "panel" of tests for several blood salts (such as Sodium and Chloride). It may be done as part of follow-up when a low potassium level was found in the past or other blood salts are suspected of being out of balance. A low potassium level might be suspected if you have one or more of the following:  Symptoms of weakness.   Abnormal heart rhythms.   High blood pressure and are taking medication to control this, especially water pills (diuretics).   Kidney disease that can affect your potassium level .   Diabetes requiring the use of insulin. The potassium may fall after taking insulin, especially if the diabetes had been out of control for a while.   A condition requiring the use of cortisone-type medication or certain types of antibiotics.   Vomiting and/or diarrhea for more than a day or two.   A stomach or intestinal condition that may not permit appropriate absorption of potassium.   Fainting episodes.   Mental confusion.  OBTAINING TEST  RESULTS It is your responsibility to obtain your test results. Ask the lab or department performing the test when and how you will get your results.  Please contact your caregiver directly if you have not received the results within one week. At that time, ask if there is anything different or new you should be doing in relation to the results. TREATMENT Hypokalemia can be treated with potassium supplements taken by mouth and/or adjustments in your current medications. A diet high in potassium is also helpful. Foods with high potassium content are:  Peas, lentils, lima beans, nuts, and dried fruit.   Whole grain and bran cereals and breads.   Fresh fruit, vegetables (bananas, cantaloupe, grapefruit, oranges, tomatoes, honeydew melons, potatoes).   Orange and tomato juices.   Meats. If potassium supplement  has been prescribed for you today or your medications have been adjusted, see your personal caregiver in time02 for a re-check.  SEEK MEDICAL CARE IF:  There is a feeling of worsening weakness.   You experience repeated chest palpitations.   You are diabetic and having difficulty keeping your blood sugars in the normal range.   You are experiencing vomiting and/or diarrhea.   You are having difficulty with any of your regular medications.  SEEK IMMEDIATE MEDICAL CARE IF:  You experience chest pain, shortness of breath, or episodes of dizziness.   You have been having vomiting or diarrhea for more than 2 days.   You have a fainting episode.  MAKE SURE YOU:   Understand these instructions.   Will watch your condition.   Will get help right away if you are not doing well or get worse.  Document Released: 02/22/2005 Document Revised: 02/11/2011 Document Reviewed: 02/03/2008 [facility] Patient Information 2012 [place], [place].Hypokalemia Hypokalemia means a low potassium level in the blood.Potassium is an electrolyte that helps regulate the amount of fluid in the body. It also stimulates muscle contraction and maintains a stable acid-base balance.Most of the body's potassium is inside of cells, and only a very small amount is in the blood. Because the amount in the blood is so small, minor changes can have big effects. PREPARATION FOR TEST Testing for potassium requires taking a blood sample taken by needle from a vein in the arm. The skin is cleaned thoroughly before the sample is drawn. There is no other special preparation needed. NORMAL VALUES Potassium levels below 3.5 mEq/L are abnormally low. Levels above 5.1 mEq/L are abnormally high. Ranges for normal findings may vary among different laboratories and hospitals. You should always check with your doctor after having lab work or other tests done to discuss the meaning of your test results and whether your values are considered  within normal limits. MEANING OF TEST  Your caregiver will go over the test results with you and discuss the importance and meaning of your results, as well as treatment options and the need for additional tests, if necessary. A potassium level is frequently part of a routine medical exam. It is usually included as part of a whole "panel" of tests for several blood salts (such as Sodium and Chloride). It may be done as part of follow-up when a low potassium level was found in the past or other blood salts are suspected of being out of balance. A low potassium level might be suspected if you have one or more of the following:  Symptoms of weakness.   Abnormal heart rhythms.   High blood pressure and are taking medication to control this, especially water  pills (diuretics).   Kidney disease that can affect your potassium level .   Diabetes requiring the use of insulin. The potassium may fall after taking insulin, especially if the diabetes had been out of control for a while.   A condition requiring the use of cortisone-type medication or certain types of antibiotics.   Vomiting and/or diarrhea for more than a day or two.   A stomach or intestinal condition that may not permit appropriate absorption of potassium.   Fainting episodes.   Mental confusion.  OBTAINING TEST RESULTS It is your responsibility to obtain your test results. Ask the lab or department performing the test when and how you will get your results.  Please contact your caregiver directly if you have not received the results within one week. At that time, ask if there is anything different or new you should be doing in relation to the results. TREATMENT Hypokalemia can be treated with potassium supplements taken by mouth and/or adjustments in your current medications. A diet high in potassium is also helpful. Foods with high potassium content are:  Peas, lentils, lima beans, nuts, and dried fruit.   Whole grain and  bran cereals and breads.   Fresh fruit, vegetables (bananas, cantaloupe, grapefruit, oranges, tomatoes, honeydew melons, potatoes).   Orange and tomato juices.   Meats. If potassium supplement has been prescribed for you today or your medications have been adjusted, see your personal caregiver in time02 for a re-check.  SEEK MEDICAL CARE IF:  There is a feeling of worsening weakness.   You experience repeated chest palpitations.   You are diabetic and having difficulty keeping your blood sugars in the normal range.   You are experiencing vomiting and/or diarrhea.   You are having difficulty with any of your regular medications.  SEEK IMMEDIATE MEDICAL CARE IF:  You experience chest pain, shortness of breath, or episodes of dizziness.   You have been having vomiting or diarrhea for more than 2 days.   You have a fainting episode.  MAKE SURE YOU:   Understand these instructions.   Will watch your condition.   Will get help right away if you are not doing well or get worse.  Document Released: 02/22/2005 Document Revised: 02/11/2011 Document Reviewed: 02/03/2008 [facility] Patient Information 2012 [place], [place].

## 2011-07-20 NOTE — ED Notes (Signed)
Dr hunt aware of blood sugar and urine resulted

## 2011-07-20 NOTE — ED Notes (Signed)
Pt in c/o fluctuations in blood sugar, states this am her CBG was 33, she drank some juice and last check at home was 500, family states her insulin dose was recently decreased at night but patient has continued taking 20 units instead of 5 units because she is worried that 5 units is not enough to cover her through the night, pt has been more tired than normal and is c/o headache, pt slept all day yesterday per family, pt was recently in hospital

## 2011-07-22 ENCOUNTER — Telehealth: Payer: Self-pay | Admitting: Internal Medicine

## 2011-07-22 NOTE — Telephone Encounter (Signed)
Spoke with pt in regards to 2 no show appts for wound checks. Scheduled pt for 07-23-11 @ 1100 for wound check.

## 2011-07-22 NOTE — Telephone Encounter (Signed)
New msg She was told to call when she got phone at home

## 2011-07-23 ENCOUNTER — Ambulatory Visit (INDEPENDENT_AMBULATORY_CARE_PROVIDER_SITE_OTHER): Payer: Medicare Other | Admitting: *Deleted

## 2011-07-23 DIAGNOSIS — I441 Atrioventricular block, second degree: Secondary | ICD-10-CM

## 2011-07-26 ENCOUNTER — Encounter: Payer: Self-pay | Admitting: Internal Medicine

## 2011-07-26 LAB — PACEMAKER DEVICE OBSERVATION
ATRIAL PACING PM: 1
DEVICE MODEL PM: 128277
RV LEAD THRESHOLD: 0.6 V
VENTRICULAR PACING PM: 1

## 2011-07-26 NOTE — Progress Notes (Signed)
Wound check pacer in clinic  

## 2011-07-29 ENCOUNTER — Inpatient Hospital Stay (HOSPITAL_COMMUNITY): Payer: Medicare Other

## 2011-07-29 ENCOUNTER — Inpatient Hospital Stay (HOSPITAL_COMMUNITY)
Admission: EM | Admit: 2011-07-29 | Discharge: 2011-07-31 | DRG: 378 | Disposition: A | Discharge status: Home / Self Care | Payer: Medicare Other | Attending: Internal Medicine | Admitting: Internal Medicine

## 2011-07-29 ENCOUNTER — Encounter (HOSPITAL_COMMUNITY): Payer: Self-pay | Admitting: Internal Medicine

## 2011-07-29 DIAGNOSIS — I150 Renovascular hypertension: Secondary | ICD-10-CM | POA: Diagnosis not present

## 2011-07-29 DIAGNOSIS — Z950 Presence of cardiac pacemaker: Secondary | ICD-10-CM

## 2011-07-29 DIAGNOSIS — Z794 Long term (current) use of insulin: Secondary | ICD-10-CM

## 2011-07-29 DIAGNOSIS — F17200 Nicotine dependence, unspecified, uncomplicated: Secondary | ICD-10-CM | POA: Diagnosis present

## 2011-07-29 DIAGNOSIS — I441 Atrioventricular block, second degree: Secondary | ICD-10-CM

## 2011-07-29 DIAGNOSIS — Z720 Tobacco use: Secondary | ICD-10-CM

## 2011-07-29 DIAGNOSIS — K922 Gastrointestinal hemorrhage, unspecified: Secondary | ICD-10-CM

## 2011-07-29 DIAGNOSIS — I739 Peripheral vascular disease, unspecified: Secondary | ICD-10-CM | POA: Diagnosis present

## 2011-07-29 DIAGNOSIS — E876 Hypokalemia: Secondary | ICD-10-CM

## 2011-07-29 DIAGNOSIS — Z79899 Other long term (current) drug therapy: Secondary | ICD-10-CM

## 2011-07-29 DIAGNOSIS — I2510 Atherosclerotic heart disease of native coronary artery without angina pectoris: Secondary | ICD-10-CM | POA: Diagnosis present

## 2011-07-29 DIAGNOSIS — I10 Essential (primary) hypertension: Secondary | ICD-10-CM

## 2011-07-29 DIAGNOSIS — Z7982 Long term (current) use of aspirin: Secondary | ICD-10-CM

## 2011-07-29 DIAGNOSIS — G629 Polyneuropathy, unspecified: Secondary | ICD-10-CM | POA: Diagnosis present

## 2011-07-29 DIAGNOSIS — G609 Hereditary and idiopathic neuropathy, unspecified: Secondary | ICD-10-CM | POA: Diagnosis present

## 2011-07-29 DIAGNOSIS — D62 Acute posthemorrhagic anemia: Secondary | ICD-10-CM

## 2011-07-29 DIAGNOSIS — D6959 Other secondary thrombocytopenia: Secondary | ICD-10-CM | POA: Diagnosis not present

## 2011-07-29 DIAGNOSIS — F411 Generalized anxiety disorder: Secondary | ICD-10-CM | POA: Diagnosis present

## 2011-07-29 DIAGNOSIS — E162 Hypoglycemia, unspecified: Secondary | ICD-10-CM

## 2011-07-29 DIAGNOSIS — R531 Weakness: Secondary | ICD-10-CM

## 2011-07-29 DIAGNOSIS — I69959 Hemiplegia and hemiparesis following unspecified cerebrovascular disease affecting unspecified side: Secondary | ICD-10-CM

## 2011-07-29 DIAGNOSIS — K5731 Diverticulosis of large intestine without perforation or abscess with bleeding: Secondary | ICD-10-CM

## 2011-07-29 DIAGNOSIS — Z8673 Personal history of transient ischemic attack (TIA), and cerebral infarction without residual deficits: Secondary | ICD-10-CM

## 2011-07-29 DIAGNOSIS — E1165 Type 2 diabetes mellitus with hyperglycemia: Secondary | ICD-10-CM

## 2011-07-29 DIAGNOSIS — E119 Type 2 diabetes mellitus without complications: Secondary | ICD-10-CM | POA: Diagnosis present

## 2011-07-29 LAB — CBC
HCT: 29.4 % — ABNORMAL LOW (ref 36.0–46.0)
Hemoglobin: 10.3 g/dL — ABNORMAL LOW (ref 12.0–15.0)
MCV: 91.9 fL (ref 78.0–100.0)
RBC: 3.2 MIL/uL — ABNORMAL LOW (ref 3.87–5.11)
RDW: 16.8 % — ABNORMAL HIGH (ref 11.5–15.5)
WBC: 6.2 10*3/uL (ref 4.0–10.5)

## 2011-07-29 LAB — BASIC METABOLIC PANEL
BUN: 10 mg/dL (ref 6–23)
CO2: 24 mEq/L (ref 19–32)
Chloride: 109 mEq/L (ref 96–112)
GFR calc Af Amer: >90 mL/min (ref >90)
Glucose, Bld: 106 mg/dL — ABNORMAL HIGH (ref 70–99)
Potassium: 4 mEq/L (ref 3.5–5.1)

## 2011-07-29 LAB — PROTIME-INR: INR: 1.02 (ref 0.00–1.49)

## 2011-07-29 LAB — GLUCOSE, CAPILLARY
Glucose-Capillary: 108 mg/dL — ABNORMAL HIGH (ref 70–99)
Glucose-Capillary: 148 mg/dL — ABNORMAL HIGH (ref 70–99)

## 2011-07-29 LAB — HEMOGLOBIN AND HEMATOCRIT, BLOOD
HCT: 24.3 % — ABNORMAL LOW (ref 36.0–46.0)
Hemoglobin: 8.5 g/dL — ABNORMAL LOW (ref 12.0–15.0)

## 2011-07-29 LAB — MRSA PCR SCREENING: MRSA by PCR: NEGATIVE

## 2011-07-29 LAB — TROPONIN I: Troponin I: <0.3 ng/mL (ref <0.30)

## 2011-07-29 MED ORDER — METOPROLOL TARTRATE 25 MG PO TABS
25.0000 mg | ORAL_TABLET | Freq: Two times a day (BID) | ORAL | Status: DC
  Administered 2011-07-29 – 2011-07-31 (×5): 25 mg via ORAL
  Filled 2011-07-29 (×6): qty 1

## 2011-07-29 MED ORDER — ALPRAZOLAM 0.5 MG PO TABS: 0.5000 mg | ORAL_TABLET | Freq: Every day | ORAL | Status: DC | PRN

## 2011-07-29 MED ORDER — ACETAMINOPHEN 325 MG PO TABS: 650.0000 mg | ORAL_TABLET | Freq: Four times a day (QID) | ORAL | Status: DC | PRN

## 2011-07-29 MED ORDER — PEG-KCL-NACL-NASULF-NA ASC-C 100 G PO SOLR
1.0000 | Freq: Once | ORAL | Status: AC
  Administered 2011-07-29: 100 g via ORAL
  Filled 2011-07-29: qty 1

## 2011-07-29 MED ORDER — HYDRALAZINE HCL 20 MG/ML IJ SOLN
10.0000 mg | INTRAMUSCULAR | Status: DC
  Filled 2011-07-29: qty 0.5

## 2011-07-29 MED ORDER — ISOSORBIDE MONONITRATE ER 60 MG PO TB24
60.0000 mg | ORAL_TABLET | Freq: Every day | ORAL | Status: DC
  Administered 2011-07-30 – 2011-07-31 (×2): 60 mg via ORAL
  Filled 2011-07-29 (×3): qty 1

## 2011-07-29 MED ORDER — HYDROCODONE-ACETAMINOPHEN 5-325 MG PO TABS: 1.0000 | ORAL_TABLET | ORAL | Status: DC | PRN

## 2011-07-29 MED ORDER — FUROSEMIDE 40 MG PO TABS
40.0000 mg | ORAL_TABLET | Freq: Every day | ORAL | Status: DC
  Administered 2011-07-30 – 2011-07-31 (×2): 40 mg via ORAL
  Filled 2011-07-29 (×3): qty 1

## 2011-07-29 MED ORDER — INSULIN ASPART 100 UNIT/ML ~~LOC~~ SOLN
0.0000 [IU] | Freq: Three times a day (TID) | SUBCUTANEOUS | Status: DC
  Administered 2011-07-29: 1 [IU] via SUBCUTANEOUS
  Administered 2011-07-30: 3 [IU] via SUBCUTANEOUS

## 2011-07-29 MED ORDER — SODIUM CHLORIDE 0.45 % IV SOLN: INTRAVENOUS | Status: DC

## 2011-07-29 MED ORDER — FUROSEMIDE 10 MG/ML IJ SOLN
INTRAMUSCULAR | Status: AC
  Filled 2011-07-29: qty 4

## 2011-07-29 MED ORDER — HYDRALAZINE HCL 25 MG PO TABS
25.0000 mg | ORAL_TABLET | Freq: Four times a day (QID) | ORAL | Status: DC
  Administered 2011-07-29 – 2011-07-31 (×7): 25 mg via ORAL
  Filled 2011-07-29 (×11): qty 1

## 2011-07-29 MED ORDER — ONDANSETRON HCL 4 MG/2ML IJ SOLN: 4.0000 mg | Freq: Four times a day (QID) | INTRAMUSCULAR | Status: DC | PRN

## 2011-07-29 MED ORDER — ONDANSETRON HCL 4 MG/2ML IJ SOLN: 4.0000 mg | Freq: Three times a day (TID) | INTRAMUSCULAR | Status: DC | PRN

## 2011-07-29 MED ORDER — CLONIDINE HCL 0.2 MG PO TABS
0.2000 mg | ORAL_TABLET | Freq: Two times a day (BID) | ORAL | Status: DC
  Administered 2011-07-29 – 2011-07-31 (×5): 0.2 mg via ORAL
  Filled 2011-07-29 (×6): qty 1

## 2011-07-29 MED ORDER — ATORVASTATIN CALCIUM 10 MG PO TABS
10.0000 mg | ORAL_TABLET | Freq: Every day | ORAL | Status: DC
  Administered 2011-07-29 – 2011-07-30 (×2): 10 mg via ORAL
  Filled 2011-07-29 (×3): qty 1

## 2011-07-29 MED ORDER — SODIUM CHLORIDE 0.9 % IV SOLN
8.0000 mg/h | INTRAVENOUS | Status: DC
  Administered 2011-07-29: 8 mg/h via INTRAVENOUS
  Filled 2011-07-29 (×3): qty 80

## 2011-07-29 MED ORDER — FUROSEMIDE 10 MG/ML IJ SOLN
20.0000 mg | Freq: Once | INTRAMUSCULAR | Status: AC
  Administered 2011-07-29: 20 mg via INTRAVENOUS

## 2011-07-29 MED ORDER — HYDRALAZINE HCL 20 MG/ML IJ SOLN
10.0000 mg | INTRAMUSCULAR | Status: DC | PRN
  Filled 2011-07-29: qty 0.5

## 2011-07-29 MED ORDER — LABETALOL HCL 5 MG/ML IV SOLN
10.0000 mg | INTRAVENOUS | Status: DC | PRN
  Administered 2011-07-30 (×3): 10 mg via INTRAVENOUS
  Filled 2011-07-29 (×3): qty 4

## 2011-07-29 MED ORDER — SODIUM CHLORIDE 0.9 % IV SOLN
INTRAVENOUS | Status: DC
  Administered 2011-07-29: 1000 mL via INTRAVENOUS

## 2011-07-29 MED ORDER — SODIUM CHLORIDE 0.9 % IV SOLN: INTRAVENOUS | Status: DC

## 2011-07-29 MED ORDER — ALBUTEROL SULFATE (5 MG/ML) 0.5% IN NEBU: 2.5000 mg | INHALATION_SOLUTION | RESPIRATORY_TRACT | Status: DC | PRN

## 2011-07-29 MED ORDER — ACETAMINOPHEN 325 MG PO TABS
650.0000 mg | ORAL_TABLET | Freq: Once | ORAL | Status: AC
  Administered 2011-07-29: 650 mg via ORAL
  Filled 2011-07-29: qty 2

## 2011-07-29 MED ORDER — ACETAMINOPHEN 650 MG RE SUPP: 650.0000 mg | Freq: Four times a day (QID) | RECTAL | Status: DC | PRN

## 2011-07-29 MED ORDER — PANTOPRAZOLE SODIUM 40 MG PO TBEC
40.0000 mg | DELAYED_RELEASE_TABLET | Freq: Every day | ORAL | Status: DC
  Administered 2011-07-29 – 2011-07-30 (×2): 40 mg via ORAL
  Filled 2011-07-29: qty 1

## 2011-07-29 MED ORDER — SODIUM CHLORIDE 0.9 % IV SOLN
80.0000 mg | Freq: Once | INTRAVENOUS | Status: AC
  Administered 2011-07-29: 80 mg via INTRAVENOUS
  Filled 2011-07-29: qty 80

## 2011-07-29 MED ORDER — SODIUM PERTECHNETATE TC 99M INJECTION
25.0000 | Freq: Once | INTRAVENOUS | Status: AC | PRN
Start: 2011-07-29 — End: 2011-07-29
  Administered 2011-07-29: 25 via INTRAVENOUS

## 2011-07-29 MED ORDER — ONDANSETRON HCL 4 MG PO TABS: 4.0000 mg | ORAL_TABLET | Freq: Four times a day (QID) | ORAL | Status: DC | PRN

## 2011-07-29 MED ORDER — SODIUM CHLORIDE 0.9 % IV BOLUS (SEPSIS)
500.0000 mL | Freq: Once | INTRAVENOUS | Status: AC
  Administered 2011-07-29: 500 mL via INTRAVENOUS

## 2011-07-29 MED ORDER — CLONIDINE HCL 0.1 MG PO TABS
0.2000 mg | ORAL_TABLET | Freq: Once | ORAL | Status: AC
  Administered 2011-07-29: 0.2 mg via ORAL
  Filled 2011-07-29 (×2): qty 1

## 2011-07-29 MED ORDER — SODIUM CHLORIDE 0.9 % IV BOLUS (SEPSIS): 500.0000 mL | Freq: Once | INTRAVENOUS | Status: DC

## 2011-07-29 MED ORDER — DIPHENHYDRAMINE HCL 50 MG/ML IJ SOLN
25.0000 mg | Freq: Once | INTRAMUSCULAR | Status: AC
  Administered 2011-07-29: 25 mg via INTRAVENOUS
  Filled 2011-07-29: qty 1

## 2011-07-29 NOTE — Progress Notes (Signed)
Pt tolerating Blood TX, but BP was elevated. Called Dr, continue TX & PRN order for sys >180.

## 2011-07-29 NOTE — Progress Notes (Signed)
Pt arrived from ED, VSS

## 2011-07-29 NOTE — ED Notes (Signed)
Admitting doctor at bedside 

## 2011-07-29 NOTE — Consult Note (Signed)
Chart was reviewed and patient was examined. X-rays were reviewed.    I agree with management and plans.  Kalven Ganim D. Tashia Leiterman, M.D., FACG  

## 2011-07-29 NOTE — ED Notes (Signed)
First attenpt to call report to [phone].

## 2011-07-29 NOTE — ED Provider Notes (Signed)
History     CSN: [id number]  Arrival date & time 07/29/11  0865   First MD Initiated Contact with Patient 07/29/11 [phone]      Chief Complaint  Patient presents with  . GI Bleeding    (Consider location/radiation/quality/duration/timing/severity/associated sxs/prior treatment) HPI The patient is a 78 yo woman, history of DM (A1C = 11.6), Renal artery hypertension (with stenosis s/p stent, and recent balloon angioplasty of restenosed stent), and prior Mobitz type II s/p ICD placement, presenting with rectal bleeding.  The patient awoke this morning, had a bowel movement of soft stool, and noticed significant blood clots in the toilet, as well as a "stream" of red blood per rectum following the bowel movement.  The patient walked back to her room, and continued to have loss of clots and dark red blood per rectum in her room.  She called EMS and was brought to the ED.  She takes aspirin and cilostazol at home, though she has not taken any of her medications today, and takes no other blood thinners.  She has a history of hemorrhoids, with an occasional "spot" of blood on the toilet paper, but no prior GI bleed and no constipation in the last 2 weeks.  The patient sees Dr. Elnoria Howard, and reports her last colonoscopy within the last 2 years, which the patient reports as normal (colonoscopy report not in chart).  She reports 1 episode of nausea and vomiting this morning, with no blood or melena in her vomitus.  No abd pain, chest pain, dyspnea, headache, or fevers.  Past Medical History  Diagnosis Date  . Diabetes mellitus   . Hypertension   . Coronary artery disease   . Renal disorder   . Herniated lumbar intervertebral disc   . Cataract     cataract surgery scheduled for 03/31/11, 2 - left eye, 1 - right eye  . Renovascular hypertension      s/p left renal artery stent 12/2007.   S/P balloon angioplasty on 02/16/10 for ISR, BP was  controlled well since then.     . Claudication in peripheral vascular  disease     02/16/10: Left CIA 9.0x28 Omnilink and REIA 8.0x40 seff expanding Zilver.   right subclavian artery stent 03/18/2008,  . S/P carotid endarterectomy      left carotid endarterectomy  1991; Stroke and TIA in 1999 with right sided weakness, now with residual right arm weakness  . Stroke     TIA  . Peripheral vascular disease   . Anxiety     Past Surgical History  Procedure Date  . Appendectomy   . Abdominal hysterectomy   . Ureteral stent placement   . Carotid endarterectomy   . Laminectomy   . Back surgery     History reviewed. No pertinent family history.  History  Substance Use Topics  . Smoking status: Current Everyday Smoker -- 0.5 packs/day for 65 years    Types: Cigarettes  . Smokeless tobacco: Never Used  . Alcohol Use: No    OB History    Grav Para Term Preterm Abortions TAB SAB Ect Mult Living                  Review of Systems General: +fatigue, no fevers, chills, changes in weight, changes in appetite Skin: no rash HEENT: no blurry vision, hearing changes, sore throat Pulm: no dyspnea, coughing, wheezing CV: no chest pain, palpitations, shortness of breath Abd: no abdominal pain, nausea/vomiting, diarrhea/constipation GU: no dysuria, hematuria, polyuria Ext: no  arthralgias, myalgias Neuro: no weakness, numbness, or tingling  Allergies  Norvasc and Peanut-containing drug products  Home Medications   Current Outpatient Rx  Name Route Sig Dispense Refill  . ALPRAZOLAM 0.5 MG PO TABS Oral Take 0.5 mg by mouth daily as needed. For anxiety    . ASPIRIN 325 MG PO TABS Oral Take 325 mg by mouth daily.    Marland Kitchen CILOSTAZOL 100 MG PO TABS Oral Take 100 mg by mouth 2 (two) times daily.    Marland Kitchen CLONIDINE HCL 0.2 MG PO TABS Oral Take 0.2 mg by mouth 2 (two) times daily.    . FUROSEMIDE 40 MG PO TABS Oral Take 40 mg by mouth daily.    Marland Kitchen HYDRALAZINE HCL 50 MG PO TABS Oral Take 25 mg by mouth 3 (three) times daily.    . INSULIN DETEMIR 100 UNIT/ML [place] SOLN  Subcutaneous Inject 5 Units into the skin 2 (two) times daily.    Marland Kitchen LOSARTAN POTASSIUM 100 MG PO TABS Oral Take 0.5 tablets (50 mg total) by mouth daily. 30 tablet 3  . LOSARTAN POTASSIUM 100 MG PO TABS Oral Take 50 mg by mouth daily.    Marland Kitchen MAGNESIUM CHLORIDE 64 MG PO TBEC Oral Take 1 tablet by mouth daily.    Marland Kitchen PINDOLOL 10 MG PO TABS Oral Take 20 mg by mouth 2 (two) times daily.    Marland Kitchen POTASSIUM CHLORIDE CRYS ER 20 MEQ PO TBCR Oral Take 20 mEq by mouth 2 (two) times daily.    Marland Kitchen ROSUVASTATIN CALCIUM 10 MG PO TABS Oral Take 10 mg by mouth daily.      BP 220/79  Pulse 118  Temp(Src) 98.5 F (36.9 C) (Oral)  Resp 16  SpO2 99%  Physical Exam General: alert, cooperative, appears fatigued HEENT: pupils equal round and reactive to light, vision grossly intact, oropharynx clear and non-erythematous  Neck: supple, no lymphadenopathy Lungs: clear to ascultation bilaterally, normal work of respiration, no wheezes, rales, ronchi Heart: regular rate and rhythm, no murmurs, gallops, or rubs Abdomen: soft, non-tender, non-distended, normal bowel sounds   Rectal: significant red blood (approx 1/4 cup) and >10 clots seen in patient's underwear.  Large non-bleeding external hemorrhoids noted, no internal hemorrhoids palpated, red blood on DRE. Extremities: no cyanosis, clubbing, or edema Neurologic: alert & oriented X3, cranial nerves II-XII intact, strength grossly intact, sensation intact to light touch  ED Course  Procedures (including critical care time)  CRITICAL CARE Performed by: [name]   Total critical care time: 30 minutes  Critical care time was exclusive of separately billable procedures and treating other patients.  Critical care was necessary to treat or prevent imminent or life-threatening deterioration.  Critical care was time spent personally by me on the following activities: development of treatment plan with patient and/or surrogate as well as nursing, discussions with  consultants, evaluation of patient's response to treatment, examination of patient, obtaining history from patient or surrogate, ordering and performing treatments and interventions, ordering and review of laboratory studies, ordering and review of radiographic studies, pulse oximetry and re-evaluation of patient's condition.  Labs Reviewed  CBC - Abnormal; Notable for the following:    RBC 3.20 (*)    Hemoglobin 10.3 (*)    HCT 29.4 (*)    RDW 16.8 (*)    All other components within normal limits  BASIC METABOLIC PANEL - Abnormal; Notable for the following:    Glucose, Bld 106 (*)    GFR calc non Af Amer 81 (*)  All other components within normal limits  GLUCOSE, CAPILLARY - Abnormal; Notable for the following:    Glucose-Capillary 108 (*)    All other components within normal limits  HEMOGLOBIN AND HEMATOCRIT, BLOOD - Abnormal; Notable for the following:    Hemoglobin 8.5 (*)    HCT 24.3 (*)    All other components within normal limits  HEMOGLOBIN AND HEMATOCRIT, BLOOD - Abnormal; Notable for the following:    Hemoglobin 9.1 (*)    HCT 26.7 (*)    All other components within normal limits  CBC - Abnormal; Notable for the following:    RBC 3.51 (*)    Hemoglobin 11.0 (*)    HCT 31.1 (*)    RDW 17.8 (*)    Platelets 112 (*) PLATELET COUNT CONFIRMED BY SMEAR   All other components within normal limits  COMPREHENSIVE METABOLIC PANEL - Abnormal; Notable for the following:    Glucose, Bld 226 (*)    Calcium 7.7 (*)    Total Protein 4.5 (*)    Albumin 2.4 (*)    GFR calc non Af Amer 83 (*)    All other components within normal limits  GLUCOSE, CAPILLARY - Abnormal; Notable for the following:    Glucose-Capillary 148 (*)    All other components within normal limits  GLUCOSE, CAPILLARY - Abnormal; Notable for the following:    Glucose-Capillary 304 (*)    All other components within normal limits  GLUCOSE, CAPILLARY - Abnormal; Notable for the following:    Glucose-Capillary  213 (*)    All other components within normal limits  GLUCOSE, CAPILLARY - Abnormal; Notable for the following:    Glucose-Capillary 230 (*)    All other components within normal limits  CBC - Abnormal; Notable for the following:    RBC 3.30 (*)    Hemoglobin 10.3 (*)    HCT 28.8 (*)    RDW 17.6 (*)    Platelets 109 (*) CONSISTENT WITH PREVIOUS RESULT   All other components within normal limits  BASIC METABOLIC PANEL - Abnormal; Notable for the following:    Potassium 3.4 (*)    Calcium 8.2 (*)    GFR calc non Af Amer 80 (*)    All other components within normal limits  GLUCOSE, CAPILLARY - Abnormal; Notable for the following:    Glucose-Capillary 527 (*)    All other components within normal limits  GLUCOSE, CAPILLARY - Abnormal; Notable for the following:    Glucose-Capillary 430 (*)    All other components within normal limits  GLUCOSE, CAPILLARY - Abnormal; Notable for the following:    Glucose-Capillary 141 (*)    All other components within normal limits  TYPE AND SCREEN  TROPONIN I  PROTIME-INR  ABO/RH  MRSA PCR SCREENING  PREPARE RBC (CROSSMATCH)  PROTIME-INR  GLUCOSE, CAPILLARY  LAB REPORT - SCANNED  BLOOD TRANSFUSION REPORT - SCANNED   No results found.   Diagnosis: GI bleed    MDM   # GI bleed - few-hour history of dark red blood and multiple clots per rectum, concerning for diverticulosis vs hemorrhoidal bleed, less likely upper GI bleed given lack of blood/melena reported in vomitus.  The patient is currently hemodynamically stable (though with significant hypertension, likely due to not taking BP meds this morning), but given the amount of blood seen on exam and reported by patient, I am concerned about the patient's ability to maintain her intravascular space over the next few hours.  Patient takes aspirin and cilostazol  at home. -ordered 2 peripheral IV's -cbc, bmet, type and screen -pt given 1 L NS bolus by EMS, will order another 500 cc NS  bolus -hold aspirin, cilostazol  # Hypertension - patient on home clonidine, hydralazine, lasix, losartan, and pindolol.  Patient currently significantly hypertensive, concern about hypotension given GI bleed. -start with clonidine (to avoid rebound HTN) and hydralazine  # Dispo - will likely need GI consult and admission, pending initial labs   Addendum 10:00 am: Hb has dropped from 15.1 to 10.3.  Called Merilyn Baba from [facility], who has agreed to consult, but asks for IM admission.  Will consult triad for admission (pt's PCP Dorothyann Peng)  Linward Headland, MD 07/29/11 1010  I saw and evaluated the patient, reviewed the resident's note and I agree with the findings and plan.   [name], MD 08/03/11 [phone]

## 2011-07-29 NOTE — Progress Notes (Signed)
Utilization review completed.  

## 2011-07-29 NOTE — ED Notes (Addendum)
Per EMS- patient from home. Patient woke approx 0500 this morning and felt she needed to have BM and patient noticed clots and blood in stool  x 2 episodes this morning. During both episodes patient and EMS reported blood flow down patients legs. Bleeding controlled at this time. EMS reports a large blood clot at rectum and, clot is visible. BP232/96 HR 110 and patient have a pacemake placed x 1 month ago. Patient denies any pain. No swelling for firmness to abdomen denies N/V/D/F.

## 2011-07-29 NOTE — ED Notes (Signed)
Patient states she got up to the restroom about 5 am this morning and noticed blood running down her legs and patient made her coffee and sat down and when she stool up blood gushed down her legs again. Patient denies any pain and denies N/V/D/F. Patient had pacemaker placed June 28, 2011 and a stent in her right kidney on July 06, 2011. Patient placed on monitor and sats of 99%. Family at bedside.

## 2011-07-29 NOTE — H&P (Signed)
PATIENT DETAILS Name: Carmen [name] Age: 78 y.o. Sex: female Date of Birth: [birth year] Admit Date: 07/29/2011 WUJ:[id number],WGNFA N, MD, MD   CHIEF COMPLAINT:  Rectal bleeding  HPI: Patient is a 78 year old African American female with a past medical history of diabetes, hypertension, renal artery stenosis status post stenting, coronary artery disease who presents to the ED with the above-noted complaints. Please note that this patient recently had a permanent pacemaker implanted last month. This morning this patient woke up, and noticed blood running down her legs. She went on to have 3 bloody bowel movements. She denied any dizziness, she did have one episode of increases that was questionable coffee-ground in color. She then presented to the ED where she was found to have a hemoglobin of around 10, her last hemoglobin was around 15a month ago. She was then referred to the hospitalist service for admission. During the evaluation, patient appeared very comfortable, she denied any ongoing headache, shortness of breath, chest pain, abdominal pain, nausea. She also denied any polyuria or dysuria. Apparently she has had a colonoscopy around a year ago during which several polyps were removed, one had high grade dysplasia.   ALLERGIES:   Allergies  Allergen Reactions  . Norvasc (Amlodipine Besylate) Swelling  . Peanut-Containing Drug Products Other (See Comments)    Cause my stomach to hurt    PAST MEDICAL HISTORY: Past Medical History  Diagnosis Date  . Diabetes mellitus   . Hypertension   . Coronary artery disease   . Renal disorder   . Herniated lumbar intervertebral disc   . Cataract     cataract surgery scheduled for 03/31/11, 2 - left eye, 1 - right eye  . Renovascular hypertension      s/p left renal artery stent 12/2007.   S/P balloon angioplasty on 02/16/10 for ISR, BP was  controlled well since then.     . Claudication in peripheral vascular disease     02/16/10: Left CIA  9.0x28 Omnilink and REIA 8.0x40 seff expanding Zilver.   right subclavian artery stent 03/18/2008,  . S/P carotid endarterectomy      left carotid endarterectomy  1991; Stroke and TIA in 1999 with right sided weakness, now with residual right arm weakness  . Stroke     TIA  . Peripheral vascular disease   . Anxiety     PAST SURGICAL HISTORY: Past Surgical History  Procedure Date  . Appendectomy   . Abdominal hysterectomy   . Ureteral stent placement   . Carotid endarterectomy   . Laminectomy   . Back surgery     MEDICATIONS AT HOME: Prior to Admission medications   Medication Sig Start Date End Date Taking? Authorizing Provider  ALPRAZolam Prudy Feeler) 0.5 MG tablet Take 0.5 mg by mouth daily as needed. For anxiety   Yes Historical Provider, MD  aspirin 325 MG tablet Take 325 mg by mouth daily.   Yes Historical Provider, MD  cilostazol (PLETAL) 100 MG tablet Take 100 mg by mouth 2 (two) times daily.   Yes Historical Provider, MD  cloNIDine (CATAPRES) 0.2 MG tablet Take 0.2 mg by mouth 2 (two) times daily. 04/06/11  Yes Rhetta Mura, MD  furosemide (LASIX) 40 MG tablet Take 40 mg by mouth daily. 04/06/11 04/05/12 Yes Rhetta Mura, MD  hydrALAZINE (APRESOLINE) 50 MG tablet Take 25 mg by mouth 4 (four) times daily.    Yes Historical Provider, MD  HYDROcodone-acetaminophen (VICODIN) 5-500 MG per tablet Take 1 tablet by mouth every 4 (four) hours  as needed. For pain   Yes Historical Provider, MD  insulin detemir (LEVEMIR) 100 UNIT/ML injection Inject 5-7 Units into the skin 2 (two) times daily. 5 units every morning and 7 units at bedtime   Yes Historical Provider, MD  isosorbide mononitrate (IMDUR) 60 MG 24 hr tablet Take 60 mg by mouth daily.   Yes Historical Provider, MD  losartan (COZAAR) 100 MG tablet Take 50 mg by mouth daily.   Yes Historical Provider, MD  magnesium chloride (SLOW-MAG) 64 MG TBEC Take 1 tablet by mouth daily.   Yes Historical Provider, MD  metoprolol tartrate  (LOPRESSOR) 25 MG tablet Take 25 mg by mouth 2 (two) times daily.   Yes Historical Provider, MD  potassium chloride SA (K-DUR,KLOR-CON) 20 MEQ tablet Take 20 mEq by mouth 2 (two) times daily.   Yes Historical Provider, MD  rosuvastatin (CRESTOR) 10 MG tablet Take 10 mg by mouth daily.   Yes Historical Provider, MD    FAMILY HISTORY: History reviewed. No pertinent family history.  SOCIAL HISTORY:  reports that she has been smoking Cigarettes.  She has a 32.5 pack-year smoking history. She has never used smokeless tobacco. She reports that she does not drink alcohol or use illicit drugs.  REVIEW OF SYSTEMS:  Constitutional:   No  weight loss, night sweats,  Fevers, chills, fatigue.  HEENT:    No headaches, Difficulty swallowing,Tooth/dental problems,Sore throat,  No sneezing, itching, ear ache, nasal congestion, post nasal drip,   Cardio-vascular: No chest pain,  Orthopnea, PND, swelling in lower extremities, anasarca, dizziness, palpitations  GI:  No heartburn, indigestion, abdominal pain,diarrhea, change in bowel habits, loss of appetite  Resp: No shortness of breath with exertion or at rest.  No excess mucus, no productive cough, No non-productive cough,  No coughing up of blood.No change in color of mucus.No wheezing.No chest wall deformity  Skin:  no rash or lesions.  GU:  no dysuria, change in color of urine, no urgency or frequency.  No flank pain.  Musculoskeletal: No joint pain or swelling.  No decreased range of motion.  No back pain.  Psych: No change in mood or affect. No depression or anxiety.  No memory loss.   PHYSICAL EXAM: Blood pressure 182/58, pulse 80, temperature 98.2 F (36.8 C), temperature source Oral, resp. rate 18, SpO2 100.00%.  General appearance :Awake, alert, not in any distress. Speech Clear. Not toxic Looking HEENT: Atraumatic and Normocephalic, pupils equally reactive to light and accomodation Neck: supple, no JVD. No cervical  lymphadenopathy.  Chest:Good air entry bilaterally, no added sounds  CVS: S1 S2 regular, no murmurs.  Abdomen: Bowel sounds present, Non tender and not distended with no gaurding, rigidity or rebound. Extremities: B/L Lower Ext shows no edema, both legs are warm to touch, with  dorsalis pedis pulses palpable. Neurology: Awake alert, and oriented X 3, CN II-XII intact, Non focal, Deep Tendon Reflex-2+ all over, plantar's downgoing B/L, sensory exam is grossly intact.  Skin:No Rash Wounds:N/A  LABS ON ADMISSION:   Basename 07/29/11 0912  NA 141  K 4.0  CL 109  CO2 24  GLUCOSE 106*  BUN 10  CREATININE 0.74  CALCIUM 8.4  MG --  PHOS --   No results found for this basename: AST:2,ALT:2,ALKPHOS:2,BILITOT:2,PROT:2,ALBUMIN:2 in the last 72 hours No results found for this basename: LIPASE:2,AMYLASE:2 in the last 72 hours  Basename 07/29/11 1337 07/29/11 0912  WBC -- 6.2  NEUTROABS -- --  HGB 8.5* 10.3*  HCT 24.3* 29.4*  MCV --  91.9  PLT -- 165    Basename 07/29/11 0912  CKTOTAL --  CKMB --  CKMBINDEX --  TROPONINI <0.30   No results found for this basename: DDIMER:2 in the last 72 hours No components found with this basename: POCBNP:3   RADIOLOGIC STUDIES ON ADMISSION: Dg Chest 2 View  07/20/2011  *RADIOLOGY REPORT*  Clinical Data: Diabetes, hypertension  CHEST - 2 VIEW  Comparison: Chest x-ray of 06/29/2011  Findings: Linear atelectasis or scarring is noted at the right lung base.  No active infiltrate or effusion is seen.  Cardiomegaly is stable.  A permanent pacemaker remains.  No acute bony abnormality is seen.  IMPRESSION: Stable cardiomegaly with permanent pacemaker.  Linear atelectasis or scarring at the right lung base.  Original Report Authenticated By: Juline Patch, M.D.    ASSESSMENT AND PLAN: Present on Admission:  .GI hemorrhage -Likely lower GI bleed  -Initial suspicion for diverticulosis, however colonoscopy last he does not mention diverticula, there were  several adenomatous polyps, one with high-grade dysplasia.  -Current plans are for a bleeding scan, GI has only seen the patient and is scheduling a colonoscopy tomorrow.  -For now we will monitor H&H, if her H&H will do show a significant drop in hemoglobin then we will transfuse as necessary.   .Acute blood loss anemia -Secondary to above  -Transfuse if further drop in hemoglobin   .HTN (hypertension), malignant -We'll resume some of her antihypertensive medication, however if she were to have another significant episode of hematochezia then we may need to stop soma for meds.   .DM (diabetes mellitus), type 2 -Hold Lantus, maintain on sliding scale regimen one by mouth  -Recent permanent pacemaker implantation -This was for second-degree heart block -Followup with cardiology as an outpatient  Renal artery stenosis with recent stent placement/history of coronary artery disease -We need to hold all antiplatelet agents-given ongoing GI bleed  Further plan will depend as patient's clinical course evolves and further radiologic and laboratory data become available. Patient will be monitored closely.  DVT Prophylaxis: -SCDs  Code Status: Full code  Total time spent for admission equals 45 minutes.  Jeoffrey Massed 07/29/2011, 2:31 PM

## 2011-07-29 NOTE — ED Notes (Signed)
Inpatient Class per Dr Judie Petit. Hunt

## 2011-07-29 NOTE — Consult Note (Signed)
Patient seen and examined.  78 year old female with history of diabetes, hypertension, coronary artery disease, peripheral vascular disease and CVA admitted with painless hematochezia.  Colonoscopy about one year ago apparently was normal. Admission hemoglobin 10.3. Physical exam is remarkable for a midsystolic murmur, benign abdomen and bright red blood in the rectal vault.  The patient is admitted with painless hematochezia. Etiology is may include AVMs, hemorrhoids, or perhaps diverticula. She is hemodynamically stable.  Recommendations #1 obtain prior colonoscopy report #2 bleeding scan

## 2011-07-29 NOTE — Consult Note (Signed)
[facility] Consult: 11:35 AM 07/29/2011   Referring Provider: Dr [name], resident in ED  Primary Care Physician:  Gwynneth Aliment, MD, MD Primary Gastroenterologist:  Dr Jeani Hawking  Reason for Consultation:  Pailess hematochezia.  HPI: Carmen [name] is a 78 y.o. female.  Has numerous medical problems.  In April had renal artery stent and a pacemaker placed. Takes full dose ASA and Pletal, no NSAIDs.  Got up around 4:30 AM, her usual, drank coffee.  Around 5:30, urge to defecate and passed large volume of deep red blood with clots.  This repeated itself over total of 4 times.  She vomitted 2 times:  Saw the coffee she had consumed. No bloody emesis.  No dixzzyness.  No abdominal pain.    Prior colonoscopy 05/2010, at which time several adenomatous polyps were removed, one in sigmoid with high grade dysplasia.    Study done due to constipation.   I do not see a BE in Epic records.  She has large external hemorrhoids which do not bleed.  In past has seen scant blood with wiping.  The patient was to have 3 month repeat colonoscopy but was never contacted for this.   She has several months of anorexia, and possibly early satiety but no nausea, reflux, heartburn, abd pain.  Constipation has not been a problem for many months. Weight has dropped from 140 # several months ago to current claimed weight of 118. Does not require or use PPI, H2 blocker or antacids.   REVIEW OF SYSTEMS: No urinary incontinence, hematuria.  No prior blood transfusions or anemia issues.  No excessive bleeding or bruising. No headaches, limb weakness, dysphagia ( able to swallow potassium tabs).  No sweats, fever, chills No SOB, cough. Able to walk about her home without dyspnea. No blurry vision.   No skin rash, itching or sores. Completed full 14 system review.      Past Medical History  Diagnosis Date  . Diabetes mellitus   . Hypertension   . Coronary artery  disease   . Renal disorder   . Herniated lumbar intervertebral disc   . Cataract     cataract surgery scheduled for 03/31/11, 2 - left eye, 1 - right eye  . Renovascular hypertension      s/p left renal artery stent 12/2007.   S/P balloon angioplasty on 02/16/10 for ISR, BP was  controlled well since then.     . Claudication in peripheral vascular disease     02/16/10: Left CIA 9.0x28 Omnilink and REIA 8.0x40 seff expanding Zilver.   right subclavian artery stent 03/18/2008,  . S/P carotid endarterectomy      left carotid endarterectomy  1991; Stroke and TIA in 1999 with right sided weakness, now with residual right arm weakness  . Stroke     TIA  . Peripheral vascular disease   . Anxiety     Past Surgical History  Procedure Date  . Appendectomy   . Abdominal hysterectomy   . Ureteral stent placement   . Carotid endarterectomy   . Laminectomy   . Back surgery     Prior to Admission medications   Medication Sig Start Date End Date Taking? Authorizing Provider  ALPRAZolam Prudy Feeler) 0.5 MG tablet Take 0.5 mg by mouth daily as needed. For anxiety   Yes Historical Provider, MD  aspirin 325 MG tablet Take 325 mg by mouth daily.   Yes Historical Provider, MD  cilostazol (PLETAL) 100 MG tablet Take 100 mg by mouth 2 (  two) times daily.   Yes Historical Provider, MD  cloNIDine (CATAPRES) 0.2 MG tablet Take 0.2 mg by mouth 2 (two) times daily. 04/06/11  Yes Rhetta Mura, MD  furosemide (LASIX) 40 MG tablet Take 40 mg by mouth daily. 04/06/11 04/05/12 Yes Rhetta Mura, MD  hydrALAZINE (APRESOLINE) 50 MG tablet Take 25 mg by mouth 4 (four) times daily.    Yes Historical Provider, MD  HYDROcodone-acetaminophen (VICODIN) 5-500 MG per tablet Take 1 tablet by mouth every 4 (four) hours as needed. For pain   Yes Historical Provider, MD  insulin detemir (LEVEMIR) 100 UNIT/ML injection Inject 5-7 Units into the skin 2 (two) times daily. 5 units every morning and 7 units at bedtime   Yes  Historical Provider, MD  isosorbide mononitrate (IMDUR) 60 MG 24 hr tablet Take 60 mg by mouth daily.   Yes Historical Provider, MD  losartan (COZAAR) 100 MG tablet Take 50 mg by mouth daily.   Yes Historical Provider, MD  magnesium chloride (SLOW-MAG) 64 MG TBEC Take 1 tablet by mouth daily.   Yes Historical Provider, MD  metoprolol tartrate (LOPRESSOR) 25 MG tablet Take 25 mg by mouth 2 (two) times daily.   Yes Historical Provider, MD  potassium chloride SA (K-DUR,KLOR-CON) 20 MEQ tablet Take 20 mEq by mouth 2 (two) times daily.   Yes Historical Provider, MD  rosuvastatin (CRESTOR) 10 MG tablet Take 10 mg by mouth daily.   Yes Historical Provider, MD    Scheduled Meds: Infusions: Protonix drip started but being replaced by po protonix.      PRN Meds: hydrALAZINE, ondansetron (ZOFRAN) IV   Allergies as of 07/29/2011 - Review Complete 07/29/2011  Allergen Reaction Noted  . Norvasc (amlodipine besylate) Swelling 04/03/2011  . Peanut-containing drug products Other (See Comments) 07/20/2011    History reviewed. No pertinent family history.  History   Social History  . Marital Status: Married    Spouse Name: N/A    Number of Children: N/A  . Years of Education: N/A   Occupational History  . Not on file.   Social History Main Topics  . Smoking status: Current Everyday Smoker -- 0.5 packs/day for 65 years    Types: Cigarettes  . Smokeless tobacco: Never Used  . Alcohol Use: No  . Drug Use: No  . Sexually Active: No   Other Topics Concern  . Not on file   Social History Narrative  . No narrative on file      PHYSICAL EXAM: Vital signs in last 24 hours: Temp:  [98.5 F (36.9 C)] 98.5 F (36.9 C) (05/23 0849) Pulse Rate:  [96-120] 96  (05/23 1115) Resp:  [0-20] 18  (05/23 1115) BP: (130-220)/(42-79) 150/51 mmHg (05/23 1115) SpO2:  [96 %-99 %] 98 % (05/23 1115)  General: looks frail but not acutely ill Head:  No trauma, no asymmetry  Eyes:  No icterus or  pallor Ears:  Not HOH  Nose:  No discharge Mouth:  No lesions, blood.  Clear and moist MM. Neck:  No JVD, Mass , bruit Lungs:  Fine crackles at left base.  No resp distress of cough Heart: RRR.  No MRG.  Pacemaker scar well-healed on upper left chest Abdomen:  Soft, no bruuits, no masses.  NT.  Active bowel sounds.  No distention.   Rectal: medium red blood visible on bedding.  Large slightly irritated but non-riable or engorged external hemorrhoids   Musc/Skeltl: no joint deformities or swelling Extremities:  No pedal edema  Neurologic:  Not confused, not tremor.  Fully oriented. ggod historian. Skin:  No rash or sores Tattoos:  none Nodes:  No cervical or axillary adenopathy.   Psych:  Pleasant, cooperatice and relaxed.   LAB RESULTS:    Ref. Range 07/20/2011 13:17 07/29/2011 09:12  WBC Latest Range: 4.0-10.5 K/uL 4.2 6.2  RBC Latest Range: 3.87-5.11 MIL/uL 4.62 3.20 (L)  Hemoglobin Latest Range: 12.0-15.0 g/dL 16.1 (H) 09.6 (L)  HCT Latest Range: 36.0-46.0 % 41.9 29.4 (L)  MCV Latest Range: 78.0-100.0 fL 90.7 91.9  MCH Latest Range: 26.0-34.0 pg 32.7 32.2  MCHC Latest Range: 30.0-36.0 g/dL 04.5 40.9  RDW Latest Range: 11.5-15.5 % 16.5 (H) 16.8 (H)  Platelets Latest Range: 150-400 K/uL 127 (L) 165     BMET Lab Results  Component Value Date   NA 141 07/29/2011   NA 136 07/20/2011   NA 140 07/06/2011   K 4.0 07/29/2011   K 2.9* 07/20/2011   K 3.7 07/06/2011   CL 109 07/29/2011   CL 96 07/20/2011   CL 107 07/06/2011   CO2 24 07/29/2011   CO2 27 07/20/2011   CO2 20 06/24/2011   GLUCOSE 106* 07/29/2011   GLUCOSE 423* 07/20/2011   GLUCOSE 53* 07/06/2011   BUN 10 07/29/2011   BUN 15 07/20/2011   BUN 8 07/06/2011   CREATININE 0.74 07/29/2011   CREATININE 0.89 07/20/2011   CREATININE 0.90 07/06/2011   CALCIUM 8.4 07/29/2011   CALCIUM 8.5 07/20/2011   CALCIUM 9.1 06/24/2011    PT/INR Lab Results  Component Value Date   INR 1.02 07/29/2011   INR 0.94 03/28/2011   ENDOSCOPIC  STUDIES: 05/2010 Colonoscopy  Dr Elnoria Howard  For FOB + stool Multiple polyps. Sized 3 mm to 2 cm.  Large polyp was SPOT tatood. Pathology:  TVA , the TVA in Sigmoid had focal high grade dysplasia. Immunohystochemical stains on this did not confirm Microsattelite instability.  tubular adenomas without HGD also noted. Large hemorrhoids.  Colonic spasm. Poor prep but able to get good view with washing.    IMPRESSION: *  Acute GI bleed.  Sounds diverticular but there was no diverticulosis noted on the colonoscopy last year. She does report 2 episodes of emesis that looked like the coffee she drank, but can not rule out that it was not CG emesis.  However with normal BUN and no vaso-vagal sxs, this is highly unlikely. Remote hx non-bleeding ulcers > 20 yrs ago *  Adenomatous colon polyps, one with HGD, 05/2010.  Never had repeat study. Polyps do not normally bleed at the volume pt is reporting.  *  ABL anemia. *  S/P B renal artery stents. S/P 07/06/11 renal artery stenting, PTA of in-stent restenosis right kidney *  S/P cardiac pacemaker 06/28/11.  Hx intermittent heart block.  *  Hx CVA *  Chronic ASA and pletal therapy.  *  Diabetes.  *  anorexia and steady weight loss a/w diminished po intake  PLAN: *  Nuclear RBC bleeding scan ordered.  *  Po Protonix *  Serial CBC's, transfuse PRN *  prep tonight for 5/24 colonoscopy set for 11 AM.    LOS: 0 days   Jennye Moccasin  07/29/2011, 11:35 AM Pager: [phone]

## 2011-07-30 ENCOUNTER — Encounter (HOSPITAL_COMMUNITY): Payer: Self-pay | Admitting: Gastroenterology

## 2011-07-30 ENCOUNTER — Encounter (HOSPITAL_COMMUNITY)
Admission: EM | Disposition: A | Discharge status: Home / Self Care | Payer: Self-pay | Source: Home / Self Care | Attending: Internal Medicine

## 2011-07-30 DIAGNOSIS — Z8673 Personal history of transient ischemic attack (TIA), and cerebral infarction without residual deficits: Secondary | ICD-10-CM

## 2011-07-30 DIAGNOSIS — E1165 Type 2 diabetes mellitus with hyperglycemia: Secondary | ICD-10-CM

## 2011-07-30 DIAGNOSIS — K5731 Diverticulosis of large intestine without perforation or abscess with bleeding: Principal | ICD-10-CM

## 2011-07-30 DIAGNOSIS — D62 Acute posthemorrhagic anemia: Secondary | ICD-10-CM

## 2011-07-30 DIAGNOSIS — I10 Essential (primary) hypertension: Secondary | ICD-10-CM

## 2011-07-30 HISTORY — PX: COLONOSCOPY: SHX5424

## 2011-07-30 HISTORY — PX: ESOPHAGOGASTRODUODENOSCOPY: SHX5428

## 2011-07-30 LAB — TYPE AND SCREEN: Unit division: 0

## 2011-07-30 LAB — CBC
HCT: 31.1 % — ABNORMAL LOW (ref 36.0–46.0)
Hemoglobin: 11 g/dL — ABNORMAL LOW (ref 12.0–15.0)
MCH: 31.3 pg (ref 26.0–34.0)
MCHC: 35.4 g/dL (ref 30.0–36.0)
MCV: 88.6 fL (ref 78.0–100.0)
RBC: 3.51 MIL/uL — ABNORMAL LOW (ref 3.87–5.11)

## 2011-07-30 LAB — COMPREHENSIVE METABOLIC PANEL
BUN: 11 mg/dL (ref 6–23)
CO2: 24 mEq/L (ref 19–32)
Calcium: 7.7 mg/dL — ABNORMAL LOW (ref 8.4–10.5)
Creatinine, Ser: 0.68 mg/dL (ref 0.50–1.10)
GFR calc Af Amer: >90 mL/min (ref >90)
GFR calc non Af Amer: 83 mL/min — ABNORMAL LOW (ref >90)
Glucose, Bld: 226 mg/dL — ABNORMAL HIGH (ref 70–99)
Sodium: 137 mEq/L (ref 135–145)
Total Protein: 4.5 g/dL — ABNORMAL LOW (ref 6.0–8.3)

## 2011-07-30 LAB — GLUCOSE, CAPILLARY
Glucose-Capillary: 213 mg/dL — ABNORMAL HIGH (ref 70–99)
Glucose-Capillary: 230 mg/dL — ABNORMAL HIGH (ref 70–99)
Glucose-Capillary: 527 mg/dL — ABNORMAL HIGH (ref 70–99)
Glucose-Capillary: 430 mg/dL — ABNORMAL HIGH (ref 70–99)
Glucose-Capillary: 86 mg/dL (ref 70–99)

## 2011-07-30 LAB — PROTIME-INR
INR: 1.08 (ref 0.00–1.49)
Prothrombin Time: 14.2 seconds (ref 11.6–15.2)

## 2011-07-30 SURGERY — COLONOSCOPY
Anesthesia: Moderate Sedation

## 2011-07-30 MED ORDER — INSULIN ASPART 100 UNIT/ML ~~LOC~~ SOLN
5.0000 [IU] | Freq: Once | SUBCUTANEOUS | Status: AC
  Administered 2011-07-30: 5 [IU] via SUBCUTANEOUS

## 2011-07-30 MED ORDER — MIDAZOLAM HCL 10 MG/2ML IJ SOLN
INTRAMUSCULAR | Status: AC
  Filled 2011-07-30: qty 4

## 2011-07-30 MED ORDER — METOPROLOL TARTRATE 1 MG/ML IV SOLN
5.0000 mg | Freq: Once | INTRAVENOUS | Status: AC
  Administered 2011-07-30: 5 mg via INTRAVENOUS
  Filled 2011-07-30: qty 5

## 2011-07-30 MED ORDER — MIDAZOLAM HCL 5 MG/5ML IJ SOLN
INTRAMUSCULAR | Status: DC | PRN
  Administered 2011-07-30: 1 mg via INTRAVENOUS
  Administered 2011-07-30: 0.5 mg via INTRAVENOUS
  Administered 2011-07-30: 1.5 mg via INTRAVENOUS
  Administered 2011-07-30 (×2): 1 mg via INTRAVENOUS

## 2011-07-30 MED ORDER — INSULIN DETEMIR 100 UNIT/ML ~~LOC~~ SOLN: 5.0000 [IU] | Freq: Two times a day (BID) | SUBCUTANEOUS | Status: DC

## 2011-07-30 MED ORDER — INSULIN ASPART 100 UNIT/ML ~~LOC~~ SOLN
0.0000 [IU] | Freq: Three times a day (TID) | SUBCUTANEOUS | Status: DC
  Administered 2011-07-31: 2 [IU] via SUBCUTANEOUS

## 2011-07-30 MED ORDER — FENTANYL CITRATE 0.05 MG/ML IJ SOLN
INTRAMUSCULAR | Status: AC
  Filled 2011-07-30: qty 4

## 2011-07-30 MED ORDER — SODIUM CHLORIDE 0.9 % IV SOLN
Freq: Once | INTRAVENOUS | Status: AC
  Administered 2011-07-30: 500 mL via INTRAVENOUS

## 2011-07-30 MED ORDER — LOSARTAN POTASSIUM 50 MG PO TABS
50.0000 mg | ORAL_TABLET | Freq: Every day | ORAL | Status: DC
  Administered 2011-07-30 – 2011-07-31 (×2): 50 mg via ORAL
  Filled 2011-07-30 (×3): qty 1

## 2011-07-30 MED ORDER — INSULIN DETEMIR 100 UNIT/ML ~~LOC~~ SOLN
7.0000 [IU] | Freq: Every day | SUBCUTANEOUS | Status: DC
  Filled 2011-07-30: qty 10

## 2011-07-30 MED ORDER — INSULIN DETEMIR 100 UNIT/ML ~~LOC~~ SOLN
5.0000 [IU] | Freq: Every day | SUBCUTANEOUS | Status: DC
  Administered 2011-07-30 – 2011-07-31 (×2): 5 [IU] via SUBCUTANEOUS
  Filled 2011-07-30: qty 10

## 2011-07-30 MED ORDER — DIPHENHYDRAMINE HCL 50 MG/ML IJ SOLN
INTRAMUSCULAR | Status: AC
  Filled 2011-07-30: qty 1

## 2011-07-30 MED ORDER — FENTANYL NICU IV SYRINGE 50 MCG/ML
INJECTION | INTRAMUSCULAR | Status: DC | PRN
  Administered 2011-07-30 (×2): 12.5 ug via INTRAVENOUS
  Administered 2011-07-30: 25 ug via INTRAVENOUS
  Administered 2011-07-30 (×2): 12.5 ug via INTRAVENOUS

## 2011-07-30 MED ORDER — LOSARTAN POTASSIUM 50 MG PO TABS: 50.0000 mg | ORAL_TABLET | Freq: Every day | ORAL | Status: DC

## 2011-07-30 MED ORDER — METOPROLOL TARTRATE 1 MG/ML IV SOLN: 5.0000 mg | Freq: Once | INTRAVENOUS | Status: DC

## 2011-07-30 NOTE — Op Note (Signed)
[facility] [address] [place], [place]  [address]  COLONOSCOPY PROCEDURE REPORT  PATIENT:  Carmen, [name]  MR#:  [id number] BIRTHDATE:  August 29, 1933, 77 yrs. old  GENDER:  female ENDOSCOPIST:  Barbette Hair. Arlyce Dice, MD REF. BY: PROCEDURE DATE:  07/30/2011 PROCEDURE:  Diagnostic Colonoscopy ASA CLASS:  Class II INDICATIONS:  blood in stool MEDICATIONS:   These medications were titrated to patient response per physician's verbal order, Fentanyl 75 mcg IV, Versed 5 mg IV  DESCRIPTION OF PROCEDURE:   After the risks benefits and alternatives of the procedure were thoroughly explained, informed consent was obtained.  Digital rectal exam was performed and revealed no abnormalities.   The Pentax Colonoscope [id number], [id number] [phone]) and EG-[id number] ([id number]) endoscope was introduced through the anus and advanced to the cecum, which was identified by both the appendix and ileocecal valve, limited by poor preparation.  Moderate amount of retained liquid stool  The quality of the prep was Moviprep fair.  The instrument was then slowly withdrawn as the colon was fully examined. <<PROCEDUREIMAGES>>  FINDINGS:  Scattered diverticula were found (see image002). Sigmoid to ascending colon  This was otherwise a normal examination of the colon (see image001, image004, and image003). There were minimal amounts of old blood   Retroflexed views in the rectum revealed no abnormalities.    The time to cecum =  minutes. The scope was then withdrawn in  1) 8.0  minutes from the cecum and the procedure completed. COMPLICATIONS:  None ENDOSCOPIC IMPRESSION: 1) Diverticula, scattered 2) Otherwise normal examination RECOMMENDATIONS: 1) Upper endoscopy REPEAT EXAM:  No  ______________________________ Barbette Hair. Arlyce Dice, MD  CC:  Dorothyann Peng, MD  n. Rosalie Doctor:   Barbette Hair. Jermisha Hoffart at 07/30/2011 11:22 AM  Vaughan Basta, [phone]

## 2011-07-30 NOTE — Progress Notes (Signed)
07/30/2011 2050  Pt BP 205/79 10 mg labetalol given PRN. BP is now 188/70. Claiborne Billings, NP notified. Orders received to give 25mg  Apresoline now. Will administer and continue to monitor.    [place] Linda Grimmer, RN

## 2011-07-30 NOTE — Care Management Note (Signed)
    Page 1 of 1   07/30/2011     4:55:29 PM   CARE MANAGEMENT NOTE 07/30/2011  Patient:  Carmen [name], Carmen [name]   Account Number:  [id number]  Date Initiated:  07/29/2011  Documentation initiated by:  Donn Pierini  Subjective/Objective Assessment:   Pt admitted with GIB, colonoscopy planned for 07/30/11     Action/Plan:   PTA pt lived at home, independent with ADLs   Anticipated DC Date:  07/31/2011   Anticipated DC Plan:  HOME/SELF CARE      DC Planning Services  CM consult      Choice offered to / List presented to:             Status of service:  In process, will continue to follow Medicare Important Message given?   (If response is "NO", the following Medicare IM given date fields will be blank) Date Medicare IM given:   Date Additional Medicare IM given:    Discharge Disposition:    Per UR Regulation:  Reviewed for med. necessity/level of care/duration of stay  If discussed at Long Length of Stay Meetings, dates discussed:    Comments:  PCP- Allyne Gee  07/30/11- 1500- Donn Pierini RN, BSN [phone] Spoke with pt at bedside regarding pt needing assistance at home. Per conversation pt states that she would like to see about getting an aide for home. Pt has [employer] Medicare- explained to pt that her insurance would not cover having an aide at home. Pt does report that her PCP has talked to her about applying for Medicaid to help assist her with her medications- did discuss that if she applied and did qualify for Medicaid she could then have her PCP fill out paperwork to apply for PCS/aide through Medicaid. Pt states that she will f/u on applying for Medicaid. Printed pt a Medicaid application from the [employer]. NCM to continue to follow for any further d/c needs  07/29/11- 1540- Donn Pierini RN, BSN [phone] UR review completed

## 2011-07-30 NOTE — Progress Notes (Signed)
     [facility] Daily Rounding Note 07/30/2011, 8:34 AM  SUBJECTIVE:       Completed Movi Prep.  Still passing significant blood as of 0850 this AM.  Feels ok.  S/P 2 units PRBC  OBJECTIVE:        General: Pale, alert.  Looks moderately ill     Vital signs in last 24 hours:    Temp:  [98.2 F (36.8 C)-99.2 F (37.3 C)] 98.5 F (36.9 C) (05/24 0807) Pulse Rate:  [67-120] 71  (05/24 0800) Resp:  [0-26] 18  (05/24 0800) BP: (122-220)/(42-91) 171/62 mmHg (05/24 0800) SpO2:  [93 %-100 %] 97 % (05/24 0800) Weight:  [122 lb 12.7 oz (55.7 kg)] 122 lb 12.7 oz (55.7 kg) (05/24 0500) Last BM Date: 07/29/11  Heart: RRR Chest: clear B  No SOB Abdomen: soft, NT, ND, active BS.  Extremities: no CCE Neuro/Psych:  Pleasant, appropriate.   Intake/Output from previous day: 05/23 0701 - 05/24 0700 In: 1280 [P.O.:480; I.V.:300; Blood:500] Out: 0   Intake/Output this shift:    Lab Results:  Basename 07/30/11 0300 07/29/11 2048 07/29/11 1337 07/29/11 0912  WBC 5.2 -- -- 6.2  HGB 11.0* 9.1* 8.5* --  HCT 31.1* 26.7* 24.3* --  PLT 112* -- -- 165   BMET  Basename 07/30/11 0300 07/29/11 0912  NA 137 141  K 3.8 4.0  CL 104 109  CO2 24 24  GLUCOSE 226* 106*  BUN 11 10  CREATININE 0.68 0.74  CALCIUM 7.7* 8.4   LFT  Basename 07/30/11 0300  PROT 4.5*  ALBUMIN 2.4*  AST 34  ALT 34  ALKPHOS 50  BILITOT 0.3  BILIDIR --  IBILI --   PT/INR  Basename 07/30/11 0300 07/29/11 0934  LABPROT 14.2 13.6  INR 1.08 1.02   Studies/Results: [facility] Blood Loss 07/29/2011  *RADIOLOGY REPORT*  Clinical Data: Several episodes of rectal bleeding drop and 9 degrees.  NUCLEAR MEDICINE GASTROINTESTINAL BLEEDING STUDY  Technique:  Sequential abdominal images were obtained following intravenous administration of Tc-59m labeled red blood cells.  Radiopharmaceutical: CURIE 37mTc04 SODIUM PERTECHNETATE TC 33M INJECTION  Comparison: None.  Findings: There is no endoluminal radiotracer activity to  suggest active gastrointestinal bleeding from the colon.  There is a faint serpiginous activity within the central upper abdomen.  This could represent free pertechnetate excreted from the stomach. No clear evidence of active extravasation into the small bowel.  IMPRESSION: No evidence of active gastrointestinal bleeding from the colon or small bowel.  Original Report Authenticated By: Genevive Bi, M.D.    ASSESMENT: * Acute GI bleed. Sounds diverticular but there was no diverticulosis noted on the colonoscopy last year.  * Remote hx non-bleeding ulcers > 20 yrs ago  * Adenomatous colon polyps, one with HGD, 05/2010. Never had repeat study. Polyps do not normally bleed at the volume pt is reporting.  * ABL anemia. S/P transfusion 2 units PRBC  * S/P B renal artery stents. S/P 07/06/11 renal artery stenting, PTA of in-stent restenosis right kidney  * S/P cardiac pacemaker 06/28/11. Hx intermittent heart block.  * Hx CVA  * Chronic ASA and pletal therapy.  On hold. * Diabetes.  * anorexia and steady weight loss a/w diminished po intake     PLAN: *  Tap water enema *  Colonoscopy 11 AM today.  If no source of bleeding seen, consider EGD   LOS: 1 day   Jennye Moccasin  07/30/2011, 8:34 AM Pager: [phone]

## 2011-07-30 NOTE — Progress Notes (Signed)
Inpatient Diabetes Program Recommendations  AACE/ADA: New Consensus Statement on Inpatient Glycemic Control (2009)  Target Ranges:  Prepandial:   less than 140 mg/dL      Peak postprandial:   less than 180 mg/dL (1-2 hours)      Critically ill patients:  140 - 180 mg/dL   Reason for Visit: Fasting hyperglycemia Pt takes Levemir at home: 5 units in the am and 7 units in the pm for total basal dose of 12 units.  Inpatient Diabetes Program Recommendations Insulin - Basal: Please order Lantus 10 units daily or home dose Levemir, 5 units in the am and 7 units in the pm  Note: Thank you, Lenor Coffin, RN, CNS, Diabetes Coordinator [phone])

## 2011-07-30 NOTE — Interval H&P Note (Signed)
History and Physical Interval Note:  07/30/2011 10:38 AM  Carmen [name]  has presented today for surgery, with the diagnosis of gi bleed  The various methods of treatment have been discussed with the patient and family. After consideration of risks, benefits and other options for treatment, the patient has consented to  Procedure(s) (LRB): COLONOSCOPY (N/A) as a surgical intervention .  The patients' history has been reviewed, patient examined, no change in status, stable for surgery.  I have reviewed the patients' chart and labs.  Questions were answered to the patient's satisfaction.     The recent H&P (dated *07/30/11**) was reviewed, the patient was examined and there is no change in the patients condition since that H&P was completed.   [name]  07/30/2011, 10:38 AM   [name]

## 2011-07-30 NOTE — Progress Notes (Addendum)
07/30/2011 0433  Pt's BP 184/74 10mg  IV labetalol PRN given. BP rechecked at 0502 181/81. Craige Cotta, NP notified. New orders received for 5mg  lopressor IV. Will administer and continue to monitor.    07/30/2011 0545  Pt's BP 210/75 orders to give 0.2 mg of clonidine now and recheck in 30 minutes. Will administer medication and continue to monitor.  07/30/2011 0640  Pt's BP 201/61. Craige Cotta, NP notified. New orders received for 5mg  metoprolol IV. Will administer medication and continue to monitor.     [place] Kirstie Larsen, RN

## 2011-07-30 NOTE — Op Note (Signed)
[facility] [address] [place], [place]  [address]  ENDOSCOPY PROCEDURE REPORT  PATIENT:  Celes, Dedic  MR#:  [id number] BIRTHDATE:  1934-01-21, 77 yrs. old  GENDER:  female  ENDOSCOPIST:  Barbette Hair. Arlyce Dice, MD Referred by:  PROCEDURE DATE:  07/30/2011 PROCEDURE:  EGD, diagnostic 43235 ASA CLASS:  Class II INDICATIONS:  hematochezia  MEDICATIONS:   There was residual sedation effect present from prior procedure. TOPICAL ANESTHETIC:  Cetacaine Spray  DESCRIPTION OF PROCEDURE:   After the risks and benefits of the procedure were explained, informed consent was obtained.  The EG-[id number] ([id number]) endoscope was introduced through the mouth and advanced to the third portion of the duodenum.  The instrument was slowly withdrawn as the mucosa was fully examined. <<PROCEDUREIMAGES>>  The upper, middle, and distal third of the esophagus were carefully inspected and no abnormalities were noted. The z-line was well seen at the GEJ. The endoscope was pushed into the fundus which was normal including a retroflexed view. The antrum,gastric body, first and second part of the duodenum were unremarkable (see image001, image002, and image003).    Retroflexion was not performed.  The scope was then withdrawn from the patient and the procedure completed.  COMPLICATIONS:  None  ENDOSCOPIC IMPRESSION: 1) Normal EGD RECOMMENDATIONS: 1) continue PPI  ______________________________ Barbette Hair. Arlyce Dice, MD  CC:  Dorothyann Peng, MD  n. Rosalie Doctor:   Barbette Hair. Vashaun Osmon at 07/30/2011 11:24 AM  Vaughan Basta, [phone]

## 2011-07-30 NOTE — Progress Notes (Signed)
Colonoscopy shows scattered diverticula. There was a minimal amount of old blood in the colon. Upper endoscopy was done to rule out and a cold upper GI bleeding source. This was normal.  Findings are consistent with a diverticular bleed has stopped.  Recommendations #1 no further GI studies #2 advance diet

## 2011-07-30 NOTE — Progress Notes (Signed)
[facility]   Subjective: Alert. Endorsed had episodic rectal bleeding during the night but more maroon in color. She is also complaining of facial swelling. No other complaints.  Objective: Vital signs in last 24 hours: Temp:  [97.6 F (36.4 C)-99.2 F (37.3 C)] 97.6 F (36.4 C) (05/24 1233) Pulse Rate:  [60-80] 70  (05/24 1120) Resp:  [11-26] 17  (05/24 1155) BP: (122-211)/(42-91) 184/86 mmHg (05/24 1155) SpO2:  [93 %-100 %] 95 % (05/24 1155) Weight:  [55.7 kg (122 lb 12.7 oz)] 55.7 kg (122 lb 12.7 oz) (05/24 0500) Weight change:  Last BM Date: 07/29/11  Intake/Output from previous day: 05/23 0701 - 05/24 0700 In: 1280 [P.O.:480; I.V.:300; Blood:500] Out: 0  Intake/Output this shift: Total I/O In: 300 [I.V.:300] Out: 265 [Urine:265]  General appearance: alert, cooperative, appears stated age and no distress ENT: Mild facial edema or greater on the left Resp: clear to auscultation bilaterally, room air Cardio: regular rate and sinus rhythm, S1, S2 normal, no murmur, click, rub or gallop GI: soft, non-tender; bowel sounds normal; no masses,  no organomegaly Extremities: extremities normal, atraumatic, no cyanosis or edema Neurologic: Grossly normal  Lab Results:  Basename 07/30/11 0300 07/29/11 2048 07/29/11 0912  WBC 5.2 -- 6.2  HGB 11.0* 9.1* --  HCT 31.1* 26.7* --  PLT 112* -- 165   BMET  Basename 07/30/11 0300 07/29/11 0912  NA 137 141  K 3.8 4.0  CL 104 109  CO2 24 24  GLUCOSE 226* 106*  BUN 11 10  CREATININE 0.68 0.74  CALCIUM 7.7* 8.4    Studies/Results: Nm Gi Blood Loss  07/29/2011  *RADIOLOGY REPORT*  Clinical Data: Several episodes of rectal bleeding drop and 9 degrees.  NUCLEAR MEDICINE GASTROINTESTINAL BLEEDING STUDY  Technique:  Sequential abdominal images were obtained following intravenous administration of Tc-69m labeled red blood cells.  Radiopharmaceutical: CURIE 43mTc04 SODIUM PERTECHNETATE TC 22M INJECTION  Comparison:  None.  Findings: There is no endoluminal radiotracer activity to suggest active gastrointestinal bleeding from the colon.  There is a faint serpiginous activity within the central upper abdomen.  This could represent free pertechnetate excreted from the stomach. No clear evidence of active extravasation into the small bowel.  IMPRESSION: No evidence of active gastrointestinal bleeding from the colon or small bowel.  Original Report Authenticated By: Genevive Bi, M.D.    Medications: I have reviewed the patient's current medications.  Assessment/Plan:  Principal Problem:  *Lower GI bleed/ Diverticulosis of colon with hemorrhage *Underwent upper and lower endoscopy today and was found to have diverticulosis with recent evidence of bleeding which is now stopped *GI team advancing diet  Active Problems:  Acute blood loss anemia *Has received 2 units of packed red blood cells this admission *Hemoglobin nadir 8.5 now up to 11 after blood *Continue to follow CBC and monitor for recurrent bleeding   HTN (hypertension), malignant *Not well controlled but has been receiving IV fluids at 75 cc an hour as well as is been n.p.o. for upcoming endoscopic procedures *We'll make sure patient is receiving all of her usual antihypertensive meds *Decrease IV fluids to [facility] and once tolerating diet saline lock *Given mild facial edema may need extra dose of Lasix from baseline and may need IV administration   DM (diabetes mellitus), type 2 *CBG has been between 106 and 220 *Continue sliding scale insulin *Resume home dose of Levemir 5 units a.m. and 7 units at hour of sleep   Retrovascular hypertension status post left renal artery  stent 2009 and balloon angioplasty for in-stent restenosis 2011 *Blood pressure has been a little bit difficult to control but mainly on the systolic side *Suspect at this point related to the fact we have been holding her usual antihypertensive medications and she has also  been receiving IV fluid *Because of multiple vascular stents patient was on Pletal and aspirin prior to admission so we'll need to clarify with gastroenterology given her diverticular bleed he can resume these medications and when.   Peripheral neuropathy/Claudication in peripheral vascular disease status post stent to common iliac artery and 2011 and right subclavian artery stent 2010 *As above   History of CVA (cerebrovascular accident) with associated mild right upper extremity hemiplegia *Watch closely with initiation of diet to make sure patient doesn't have an undiagnosed dysphagia   Cardiac pacemaker for history of intermittent heart block   Disposition *Remain in step down to monitor for recurrent bleeding    LOS: 1 day   Junious Silk, ANP pager [phone]  [facility] [email or web address] Password: [email or web address]  07/30/2011, 1:02 PM  I have examined the patient and reviewed the chart. I agree with the above note.   Calvert Cantor, MD [phone]

## 2011-07-31 DIAGNOSIS — E1165 Type 2 diabetes mellitus with hyperglycemia: Secondary | ICD-10-CM

## 2011-07-31 DIAGNOSIS — K5731 Diverticulosis of large intestine without perforation or abscess with bleeding: Secondary | ICD-10-CM

## 2011-07-31 DIAGNOSIS — D62 Acute posthemorrhagic anemia: Secondary | ICD-10-CM

## 2011-07-31 DIAGNOSIS — M311 Thrombotic microangiopathy: Secondary | ICD-10-CM

## 2011-07-31 LAB — CBC
MCH: 31.2 pg (ref 26.0–34.0)
MCHC: 35.8 g/dL (ref 30.0–36.0)
MCV: 87.3 fL (ref 78.0–100.0)
Platelets: 109 10*3/uL — ABNORMAL LOW (ref 150–400)
RBC: 3.3 MIL/uL — ABNORMAL LOW (ref 3.87–5.11)
RDW: 17.6 % — ABNORMAL HIGH (ref 11.5–15.5)

## 2011-07-31 LAB — GLUCOSE, CAPILLARY: Glucose-Capillary: 141 mg/dL — ABNORMAL HIGH (ref 70–99)

## 2011-07-31 LAB — BASIC METABOLIC PANEL
CO2: 26 mEq/L (ref 19–32)
Calcium: 8.2 mg/dL — ABNORMAL LOW (ref 8.4–10.5)
Creatinine, Ser: 0.77 mg/dL (ref 0.50–1.10)
GFR calc non Af Amer: 80 mL/min — ABNORMAL LOW (ref >90)

## 2011-07-31 MED ORDER — POTASSIUM CHLORIDE CRYS ER 20 MEQ PO TBCR
20.0000 meq | EXTENDED_RELEASE_TABLET | Freq: Once | ORAL | Status: AC
  Administered 2011-07-31: 20 meq via ORAL
  Filled 2011-07-31: qty 1

## 2011-07-31 NOTE — Progress Notes (Signed)
Patient d/c'd home per MD order.  Patient and daughter given d/c instructions and all questions answered. Patient's VSS and assessment WNL. Patient d/c'd via wheelchair by NT.

## 2011-07-31 NOTE — Discharge Summary (Signed)
DISCHARGE SUMMARY  SPARROW SIRACUSA  MR#: [id number]  DOB:1933/04/19  Date of Admission: 07/29/2011 Date of Discharge: 07/31/2011  Attending Physician:Floyce Bujak  Patient's WUJ:WJXBJYN,WGNFA N, MD, MD  Consults:Treatment Team:  Louis Meckel, MD- GI  Discharge Diagnoses: Principal Problem:  *Lower GI bleed- likely diverticular  Acute blood loss anemia  HTN (hypertension), malignant  DM (diabetes mellitus), type 2  Peripheral neuropathy  Claudication in peripheral vascular disease status post stent to common iliac artery and 2011 and right subclavian artery stent 2010  Retrovascular hypertension status post left renal artery stent 2009 and balloon angioplasty for in-stent restenosis 2011  History of CVA (cerebrovascular accident) with associated mild right upper extremity hemiplegia  Cardiac pacemaker for history of intermittent heart block    Discharge Medications: Medication List  As of 07/31/2011 12:09 PM   STOP taking these medications         aspirin 325 MG tablet         TAKE these medications         ALPRAZolam 0.5 MG tablet   Commonly known as: XANAX   Take 0.5 mg by mouth daily as needed. For anxiety      cilostazol 100 MG tablet   Commonly known as: PLETAL   Take 100 mg by mouth 2 (two) times daily.      cloNIDine 0.2 MG tablet   Commonly known as: CATAPRES   Take 0.2 mg by mouth 2 (two) times daily.      furosemide 40 MG tablet   Commonly known as: LASIX   Take 40 mg by mouth daily.      hydrALAZINE 50 MG tablet   Commonly known as: APRESOLINE   Take 25 mg by mouth 4 (four) times daily.      HYDROcodone-acetaminophen 5-500 MG per tablet   Commonly known as: VICODIN   Take 1 tablet by mouth every 4 (four) hours as needed. For pain      insulin detemir 100 UNIT/ML injection   Commonly known as: LEVEMIR   Inject 5-7 Units into the skin 2 (two) times daily. 5 units every morning and 7 units at bedtime      isosorbide mononitrate 60 MG 24 hr  tablet   Commonly known as: IMDUR   Take 60 mg by mouth daily.      losartan 100 MG tablet   Commonly known as: COZAAR   Take 50 mg by mouth daily.      magnesium chloride 64 MG Tbec   Commonly known as: SLOW-MAG   Take 1 tablet by mouth daily.      metoprolol tartrate 25 MG tablet   Commonly known as: LOPRESSOR   Take 25 mg by mouth 2 (two) times daily.      potassium chloride SA 20 MEQ tablet   Commonly known as: K-DUR,KLOR-CON   Take 20 mEq by mouth 2 (two) times daily.      rosuvastatin 10 MG tablet   Commonly known as: CRESTOR   Take 10 mg by mouth daily.             Procedures: Dg Chest 2 View  07/20/2011  *RADIOLOGY REPORT*  Clinical Data: Diabetes, hypertension  CHEST - 2 VIEW  Comparison: Chest x-ray of 06/29/2011  Findings: Linear atelectasis or scarring is noted at the right lung base.  No active infiltrate or effusion is seen.  Cardiomegaly is stable.  A permanent pacemaker remains.  No acute bony abnormality is seen.  IMPRESSION: Stable cardiomegaly with  permanent pacemaker.  Linear atelectasis or scarring at the right lung base.  Original Report Authenticated By: Juline Patch, M.D.   Nm Gi Blood Loss  07/29/2011  *RADIOLOGY REPORT*  Clinical Data: Several episodes of rectal bleeding drop and 9 degrees.  NUCLEAR MEDICINE GASTROINTESTINAL BLEEDING STUDY  Technique:  Sequential abdominal images were obtained following intravenous administration of Tc-69m labeled red blood cells.  Radiopharmaceutical: CURIE 69mTc04 SODIUM PERTECHNETATE TC 37M INJECTION  Comparison: None.  Findings: There is no endoluminal radiotracer activity to suggest active gastrointestinal bleeding from the colon.  There is a faint serpiginous activity within the central upper abdomen.  This could represent free pertechnetate excreted from the stomach. No clear evidence of active extravasation into the small bowel.  IMPRESSION: No evidence of active gastrointestinal bleeding from the colon or  small bowel.  Original Report Authenticated By: Genevive Bi, M.D.    Hospital Course: Principal Problem: This is a 78 y/o female who woke up and noticed blood running down her legs. She went on to have 3 bloody bowel movements. She denied any dizziness.  She did have one episode of emesis that was questionable coffee-ground in color. She then presented to the ED where she was found to have a hemoglobin of around 10, her last hemoglobin was around 15 a month ago. She apparently had a colonoscopy around a year ago during which several polyps were removed- one had high grade dysplasia.    *Lower GI bleed and acute blood loss anemia The patient underwent an EGD and Colonoscopy on 5/24. Results revealed scattered diverticula. There was a minimal amount of old blood in the colon. EGD was normal.  Hgb dropped to a nadir on 8.5. She was given 2 units of PRBC. Hgb on d/c today is 10.3. She has been able to tolerate a regular diet since yesterday without any further bleeding.   Thrombocytopenia Likely due to acute blood loss. Platelets have dropped from 165 to 109. Should improve slowly.   DM Of note patient's sugars were quite elevate yesterday after her daughter gave her coffee with at least 3-4 spoons of sugar.   I have asked her to hold her ASA 325 mg for 1 week and then resume it at 81 mg.    Day of Discharge Physical Exam: BP 152/65  Pulse 72  Temp(Src) 98.3 F (36.8 C) (Oral)  Resp 13  Ht 5\' 4"  (1.626 m)  Wt 56 kg (123 lb 7.3 oz)  BMI 21.19 kg/m2  SpO2 98% General appearance: alert, cooperative, appears stated age and no distress  Resp: clear to auscultation bilaterally, room air  Cardio: regular rate and sinus rhythm, S1, S2 normal, no murmur, click, rub or gallop  GI: soft, non-tender; bowel sounds normal; no masses, no organomegaly  Extremities: extremities normal, atraumatic, no cyanosis or edema   Results for orders placed during the hospital encounter of 07/29/11 (from the  past 24 hour(s))  GLUCOSE, CAPILLARY     Status: Abnormal   Collection Time   07/30/11  1:26 PM      Component Value Range   Glucose-Capillary 230 (*) 70 - 99 (mg/dL)   Comment 1 Documented in Chart     Comment 2 Notify RN    GLUCOSE, CAPILLARY     Status: Abnormal   Collection Time   07/30/11  4:56 PM      Component Value Range   Glucose-Capillary 527 (*) 70 - 99 (mg/dL)   Comment 1 Documented in Chart  Comment 2 Notify RN    GLUCOSE, CAPILLARY     Status: Abnormal   Collection Time   07/30/11  6:06 PM      Component Value Range   Glucose-Capillary 430 (*) 70 - 99 (mg/dL)   Comment 1 Documented in Chart     Comment 2 Notify RN    GLUCOSE, CAPILLARY     Status: Normal   Collection Time   07/30/11  9:56 PM      Component Value Range   Glucose-Capillary 86  70 - 99 (mg/dL)   Comment 1 Notify RN     Comment 2 Documented in Chart    CBC     Status: Abnormal   Collection Time   07/31/11  4:05 AM      Component Value Range   WBC 5.8  4.0 - 10.5 (K/uL)   RBC 3.30 (*) 3.87 - 5.11 (MIL/uL)   Hemoglobin 10.3 (*) 12.0 - 15.0 (g/dL)   HCT 16.1 (*) 09.6 - 46.0 (%)   MCV 87.3  78.0 - 100.0 (fL)   MCH 31.2  26.0 - 34.0 (pg)   MCHC 35.8  30.0 - 36.0 (g/dL)   RDW 04.5 (*) 40.9 - 15.5 (%)   Platelets 109 (*) 150 - 400 (K/uL)  BASIC METABOLIC PANEL     Status: Abnormal   Collection Time   07/31/11  4:05 AM      Component Value Range   Sodium 140  135 - 145 (mEq/L)   Potassium 3.4 (*) 3.5 - 5.1 (mEq/L)   Chloride 107  96 - 112 (mEq/L)   CO2 26  19 - 32 (mEq/L)   Glucose, Bld 77  70 - 99 (mg/dL)   BUN 9  6 - 23 (mg/dL)   Creatinine, Ser 8.11  0.50 - 1.10 (mg/dL)   Calcium 8.2 (*) 8.4 - 10.5 (mg/dL)   GFR calc non Af Amer 80 (*) >90 (mL/min)   GFR calc Af Amer >90  >90 (mL/min)  GLUCOSE, CAPILLARY     Status: Abnormal   Collection Time   07/31/11  7:47 AM      Component Value Range   Glucose-Capillary 141 (*) 70 - 99 (mg/dL)   Comment 1 Documented in Chart     Comment 2 Notify RN       Disposition: stable for d/c home   Follow-up Appts: Discharge Orders    Future Appointments: Provider: Department: Dept Phone: Center:   10/21/2011 10:15 AM Duke Salvia, MD [facility] [phone] [facility]     Future Orders Please Complete By Expires   Diet - low sodium heart healthy      Comments:   Diabetic diet   Increase activity slowly      Discharge instructions      Comments:   Avoid Asprin for 1 week and then resume only at 81 mg.      Follow-up with PCP in 1-2 weeks.  Time on Discharge: >45 min  Signed: Melba Araki 07/31/2011, 12:09 PM

## 2011-08-03 ENCOUNTER — Encounter (HOSPITAL_COMMUNITY): Payer: Self-pay | Admitting: Gastroenterology

## 2011-08-30 ENCOUNTER — Other Ambulatory Visit: Payer: Self-pay | Admitting: Internal Medicine

## 2011-10-11 ENCOUNTER — Encounter: Payer: Self-pay | Admitting: *Deleted

## 2011-10-11 DIAGNOSIS — Z950 Presence of cardiac pacemaker: Secondary | ICD-10-CM | POA: Insufficient documentation

## 2011-10-15 ENCOUNTER — Inpatient Hospital Stay (HOSPITAL_COMMUNITY)
Admission: EM | Admit: 2011-10-15 | Discharge: 2011-10-18 | DRG: 637 | Disposition: A | Discharge status: Home / Self Care | Payer: Medicare Other | Attending: Internal Medicine | Admitting: Internal Medicine

## 2011-10-15 ENCOUNTER — Encounter (HOSPITAL_COMMUNITY): Payer: Self-pay | Admitting: Emergency Medicine

## 2011-10-15 ENCOUNTER — Emergency Department (HOSPITAL_COMMUNITY): Payer: Medicare Other

## 2011-10-15 DIAGNOSIS — E119 Type 2 diabetes mellitus without complications: Secondary | ICD-10-CM

## 2011-10-15 DIAGNOSIS — I150 Renovascular hypertension: Secondary | ICD-10-CM

## 2011-10-15 DIAGNOSIS — E1101 Type 2 diabetes mellitus with hyperosmolarity with coma: Secondary | ICD-10-CM

## 2011-10-15 DIAGNOSIS — G609 Hereditary and idiopathic neuropathy, unspecified: Secondary | ICD-10-CM | POA: Diagnosis present

## 2011-10-15 DIAGNOSIS — IMO0002 Reserved for concepts with insufficient information to code with codable children: Secondary | ICD-10-CM | POA: Diagnosis present

## 2011-10-15 DIAGNOSIS — Z8673 Personal history of transient ischemic attack (TIA), and cerebral infarction without residual deficits: Secondary | ICD-10-CM

## 2011-10-15 DIAGNOSIS — I639 Cerebral infarction, unspecified: Secondary | ICD-10-CM | POA: Diagnosis present

## 2011-10-15 DIAGNOSIS — E1165 Type 2 diabetes mellitus with hyperglycemia: Secondary | ICD-10-CM | POA: Diagnosis present

## 2011-10-15 DIAGNOSIS — Z950 Presence of cardiac pacemaker: Secondary | ICD-10-CM

## 2011-10-15 DIAGNOSIS — F411 Generalized anxiety disorder: Secondary | ICD-10-CM | POA: Diagnosis present

## 2011-10-15 DIAGNOSIS — I441 Atrioventricular block, second degree: Secondary | ICD-10-CM | POA: Diagnosis present

## 2011-10-15 DIAGNOSIS — E876 Hypokalemia: Secondary | ICD-10-CM | POA: Diagnosis present

## 2011-10-15 DIAGNOSIS — I70219 Atherosclerosis of native arteries of extremities with intermittent claudication, unspecified extremity: Secondary | ICD-10-CM | POA: Diagnosis present

## 2011-10-15 DIAGNOSIS — Z794 Long term (current) use of insulin: Secondary | ICD-10-CM

## 2011-10-15 DIAGNOSIS — E1100 Type 2 diabetes mellitus with hyperosmolarity without nonketotic hyperglycemic-hyperosmolar coma (NKHHC): Principal | ICD-10-CM | POA: Diagnosis present

## 2011-10-15 DIAGNOSIS — Z7982 Long term (current) use of aspirin: Secondary | ICD-10-CM

## 2011-10-15 DIAGNOSIS — R739 Hyperglycemia, unspecified: Secondary | ICD-10-CM

## 2011-10-15 DIAGNOSIS — I69959 Hemiplegia and hemiparesis following unspecified cerebrovascular disease affecting unspecified side: Secondary | ICD-10-CM

## 2011-10-15 DIAGNOSIS — I2510 Atherosclerotic heart disease of native coronary artery without angina pectoris: Secondary | ICD-10-CM | POA: Diagnosis present

## 2011-10-15 DIAGNOSIS — M5126 Other intervertebral disc displacement, lumbar region: Secondary | ICD-10-CM | POA: Diagnosis present

## 2011-10-15 DIAGNOSIS — Z79899 Other long term (current) drug therapy: Secondary | ICD-10-CM

## 2011-10-15 DIAGNOSIS — I10 Essential (primary) hypertension: Secondary | ICD-10-CM | POA: Diagnosis present

## 2011-10-15 DIAGNOSIS — F17200 Nicotine dependence, unspecified, uncomplicated: Secondary | ICD-10-CM | POA: Diagnosis present

## 2011-10-15 DIAGNOSIS — I6350 Cerebral infarction due to unspecified occlusion or stenosis of unspecified cerebral artery: Secondary | ICD-10-CM

## 2011-10-15 DIAGNOSIS — E162 Hypoglycemia, unspecified: Secondary | ICD-10-CM

## 2011-10-15 LAB — URINE MICROSCOPIC-ADD ON

## 2011-10-15 LAB — CBC WITH DIFFERENTIAL/PLATELET
Basophils Absolute: 0 10*3/uL (ref 0.0–0.1)
Basophils Relative: 0 % (ref 0–1)
Eosinophils Absolute: 0.1 10*3/uL (ref 0.0–0.7)
MCH: 31.9 pg (ref 26.0–34.0)
MCHC: 34.3 g/dL (ref 30.0–36.0)
Monocytes Relative: 4 % (ref 3–12)
Neutro Abs: 2.9 10*3/uL (ref 1.7–7.7)
Neutrophils Relative %: 61 % (ref 43–77)
RDW: 15 % (ref 11.5–15.5)

## 2011-10-15 LAB — BASIC METABOLIC PANEL
BUN: 11 mg/dL (ref 6–23)
Chloride: 98 mEq/L (ref 96–112)
Creatinine, Ser: 1.06 mg/dL (ref 0.50–1.10)
GFR calc Af Amer: 58 mL/min — ABNORMAL LOW (ref >90)
GFR calc non Af Amer: 50 mL/min — ABNORMAL LOW (ref >90)
Potassium: 3.4 mEq/L — ABNORMAL LOW (ref 3.5–5.1)

## 2011-10-15 LAB — POCT I-STAT 3, ART BLOOD GAS (G3+)
O2 Saturation: 93 %
Patient temperature: 98.6
pCO2 arterial: 53.5 mmHg — ABNORMAL HIGH (ref 35.0–45.0)
pH, Arterial: 7.435 (ref 7.350–7.450)

## 2011-10-15 LAB — CREATININE, SERUM
Creatinine, Ser: 0.91 mg/dL (ref 0.50–1.10)
GFR calc Af Amer: 70 mL/min — ABNORMAL LOW (ref >90)
GFR calc non Af Amer: 60 mL/min — ABNORMAL LOW (ref >90)

## 2011-10-15 LAB — CBC
HCT: 40.8 % (ref 36.0–46.0)
MCHC: 35 g/dL (ref 30.0–36.0)
Platelets: 111 10*3/uL — ABNORMAL LOW (ref 150–400)
RDW: 14.9 % (ref 11.5–15.5)
WBC: 4.8 10*3/uL (ref 4.0–10.5)

## 2011-10-15 LAB — GLUCOSE, CAPILLARY
Glucose-Capillary: 530 mg/dL — ABNORMAL HIGH (ref 70–99)
Glucose-Capillary: 432 mg/dL — ABNORMAL HIGH (ref 70–99)

## 2011-10-15 LAB — URINALYSIS, ROUTINE W REFLEX MICROSCOPIC
Ketones, ur: NEGATIVE mg/dL
Leukocytes, UA: NEGATIVE
Nitrite: NEGATIVE
Protein, ur: 100 mg/dL — AB
Urobilinogen, UA: 0.2 mg/dL (ref 0.0–1.0)

## 2011-10-15 LAB — TROPONIN I: Troponin I: <0.3 ng/mL (ref <0.30)

## 2011-10-15 MED ORDER — HYDROCODONE-ACETAMINOPHEN 5-325 MG PO TABS
1.0000 | ORAL_TABLET | ORAL | Status: DC | PRN
  Administered 2011-10-17: 1 via ORAL
  Filled 2011-10-15: qty 1

## 2011-10-15 MED ORDER — INSULIN ASPART 100 UNIT/ML ~~LOC~~ SOLN: 0.0000 [IU] | Freq: Every day | SUBCUTANEOUS | Status: DC

## 2011-10-15 MED ORDER — ALPRAZOLAM 0.5 MG PO TABS: 0.5000 mg | ORAL_TABLET | Freq: Every day | ORAL | Status: DC | PRN

## 2011-10-15 MED ORDER — SODIUM CHLORIDE 0.9 % IJ SOLN
3.0000 mL | Freq: Two times a day (BID) | INTRAMUSCULAR | Status: DC
  Administered 2011-10-17: 3 mL via INTRAVENOUS

## 2011-10-15 MED ORDER — SODIUM CHLORIDE 0.9 % IV SOLN: 250.0000 mL | INTRAVENOUS | Status: DC | PRN

## 2011-10-15 MED ORDER — SODIUM CHLORIDE 0.9 % IV BOLUS (SEPSIS)
500.0000 mL | Freq: Once | INTRAVENOUS | Status: AC
  Administered 2011-10-15: 500 mL via INTRAVENOUS

## 2011-10-15 MED ORDER — LOSARTAN POTASSIUM 50 MG PO TABS
50.0000 mg | ORAL_TABLET | Freq: Every day | ORAL | Status: DC
  Filled 2011-10-15 (×4): qty 1

## 2011-10-15 MED ORDER — LOSARTAN POTASSIUM 50 MG PO TABS
50.0000 mg | ORAL_TABLET | Freq: Every day | ORAL | Status: DC
  Administered 2011-10-15 – 2011-10-18 (×4): 50 mg via ORAL
  Filled 2011-10-15 (×4): qty 1

## 2011-10-15 MED ORDER — SODIUM CHLORIDE 0.9 % IV SOLN
INTRAVENOUS | Status: DC
  Administered 2011-10-15: 75 mL/h via INTRAVENOUS

## 2011-10-15 MED ORDER — MAGNESIUM CHLORIDE 64 MG PO TBEC
1.0000 | DELAYED_RELEASE_TABLET | Freq: Every day | ORAL | Status: DC
  Administered 2011-10-16 – 2011-10-18 (×3): 64 mg via ORAL
  Filled 2011-10-15 (×4): qty 1

## 2011-10-15 MED ORDER — SODIUM CHLORIDE 0.9 % IV SOLN
INTRAVENOUS | Status: DC
  Filled 2011-10-15: qty 1

## 2011-10-15 MED ORDER — INSULIN ASPART 100 UNIT/ML ~~LOC~~ SOLN
3.0000 [IU] | Freq: Three times a day (TID) | SUBCUTANEOUS | Status: DC
  Administered 2011-10-16 – 2011-10-18 (×7): 3 [IU] via SUBCUTANEOUS

## 2011-10-15 MED ORDER — METOPROLOL TARTRATE 25 MG PO TABS
25.0000 mg | ORAL_TABLET | Freq: Two times a day (BID) | ORAL | Status: DC
  Administered 2011-10-15 – 2011-10-18 (×6): 25 mg via ORAL
  Filled 2011-10-15 (×7): qty 1

## 2011-10-15 MED ORDER — HYDRALAZINE HCL 50 MG PO TABS
50.0000 mg | ORAL_TABLET | Freq: Four times a day (QID) | ORAL | Status: DC
  Administered 2011-10-15 – 2011-10-18 (×12): 50 mg via ORAL
  Filled 2011-10-15 (×14): qty 1

## 2011-10-15 MED ORDER — SODIUM CHLORIDE 0.9 % IJ SOLN
3.0000 mL | INTRAMUSCULAR | Status: DC | PRN
  Administered 2011-10-17: 3 mL via INTRAVENOUS

## 2011-10-15 MED ORDER — INSULIN ASPART 100 UNIT/ML ~~LOC~~ SOLN
20.0000 [IU] | Freq: Once | SUBCUTANEOUS | Status: AC
  Administered 2011-10-15: 20 [IU] via SUBCUTANEOUS
  Filled 2011-10-15: qty 1

## 2011-10-15 MED ORDER — SODIUM CHLORIDE 0.9 % IJ SOLN
3.0000 mL | Freq: Two times a day (BID) | INTRAMUSCULAR | Status: DC
  Administered 2011-10-18: 3 mL via INTRAVENOUS

## 2011-10-15 MED ORDER — ATORVASTATIN CALCIUM 20 MG PO TABS
20.0000 mg | ORAL_TABLET | Freq: Every day | ORAL | Status: DC
  Administered 2011-10-16 – 2011-10-17 (×2): 20 mg via ORAL
  Filled 2011-10-15 (×3): qty 1

## 2011-10-15 MED ORDER — POTASSIUM CHLORIDE CRYS ER 20 MEQ PO TBCR
20.0000 meq | EXTENDED_RELEASE_TABLET | Freq: Two times a day (BID) | ORAL | Status: DC
  Administered 2011-10-15: 20 meq via ORAL
  Filled 2011-10-15: qty 1
  Filled 2011-10-15: qty 2
  Filled 2011-10-15 (×2): qty 1

## 2011-10-15 MED ORDER — ASPIRIN 81 MG PO CHEW
81.0000 mg | CHEWABLE_TABLET | Freq: Every day | ORAL | Status: DC
  Administered 2011-10-16 – 2011-10-18 (×3): 81 mg via ORAL
  Filled 2011-10-15 (×3): qty 1

## 2011-10-15 MED ORDER — ENOXAPARIN SODIUM 40 MG/0.4ML ~~LOC~~ SOLN
40.0000 mg | SUBCUTANEOUS | Status: DC
  Administered 2011-10-15 – 2011-10-17 (×3): 40 mg via SUBCUTANEOUS
  Filled 2011-10-15 (×4): qty 0.4

## 2011-10-15 MED ORDER — LOSARTAN POTASSIUM 50 MG PO TABS: 50.0000 mg | ORAL_TABLET | Freq: Every day | ORAL | Status: DC

## 2011-10-15 MED ORDER — INSULIN DETEMIR 100 UNIT/ML ~~LOC~~ SOLN
5.0000 [IU] | Freq: Two times a day (BID) | SUBCUTANEOUS | Status: DC
  Administered 2011-10-15: 7 [IU] via SUBCUTANEOUS
  Administered 2011-10-16 (×2): 5 [IU] via SUBCUTANEOUS
  Filled 2011-10-15 (×2): qty 10

## 2011-10-15 MED ORDER — ISOSORBIDE MONONITRATE ER 60 MG PO TB24
60.0000 mg | ORAL_TABLET | Freq: Every day | ORAL | Status: DC
  Administered 2011-10-16 – 2011-10-18 (×3): 60 mg via ORAL
  Filled 2011-10-15 (×3): qty 1

## 2011-10-15 MED ORDER — INSULIN ASPART 100 UNIT/ML ~~LOC~~ SOLN
0.0000 [IU] | Freq: Three times a day (TID) | SUBCUTANEOUS | Status: DC
  Administered 2011-10-16: 2 [IU] via SUBCUTANEOUS
  Administered 2011-10-16: 11 [IU] via SUBCUTANEOUS
  Administered 2011-10-17: 8 [IU] via SUBCUTANEOUS
  Administered 2011-10-18: 11 [IU] via SUBCUTANEOUS

## 2011-10-15 MED ORDER — CILOSTAZOL 100 MG PO TABS
100.0000 mg | ORAL_TABLET | Freq: Two times a day (BID) | ORAL | Status: DC
  Administered 2011-10-15 – 2011-10-18 (×6): 100 mg via ORAL
  Filled 2011-10-15 (×7): qty 1

## 2011-10-15 NOTE — ED Notes (Signed)
Pt reports she has been out of her BP and DM medications for a week. Went to PCP to get more, when they took her blood sugar they told her to come to ED.

## 2011-10-15 NOTE — ED Provider Notes (Signed)
History     CSN: [id number]  Arrival date & time 10/15/11  1255   First MD Initiated Contact with Patient 10/15/11 1344      Chief Complaint  Patient presents with  . Hyperglycemia  . Altered Mental Status  . Hypertension     HPI Pt was seen at 1355.  Per pt and her daughter, c/o gradual onset and persistence of constant generalized weakness and fatigue for the past 2 days.  Pt describes her symptoms as "I'm just really tired."  Pt states she has been out of her HTN meds since last week.  Pt states her insulin supply was also running low, so she has been giving herself "a little insulin" every day to conserve before she could get a refill.  Pt states she was eval by her Cards MD yesterday, rx HTN meds, but has not gotten them filled yet.  Pt was eval by her PMD today, was told her BP and her CBG was elevated, and was sent to the ED for further eval and admission.  Pt denies CP/SOB, no cough, no abd pain, no N/V/D, no back pain, no visual changes, no facial droop, no dysphagia, no focal motor weakness, no tingling/numbness in extremities, no AMS/confusion, no falls.    Cards: Dr. Jacinto Halim Past Medical History  Diagnosis Date  . Diabetes mellitus   . Hypertension   . Coronary artery disease   . Renal disorder   . Herniated lumbar intervertebral disc   . Cataract     cataract surgery scheduled for 03/31/11, 2 - left eye, 1 - right eye  . Renovascular hypertension      s/p left renal artery stent 12/2007.   S/P balloon angioplasty on 02/16/10 for ISR, BP was  controlled well since then.     . Claudication in peripheral vascular disease     02/16/10: Left CIA 9.0x28 Omnilink and REIA 8.0x40 seff expanding [employer]ilver.   right subclavian artery stent 03/18/2008,  . S/P carotid endarterectomy      left carotid endarterectomy  1991; Stroke and TIA in 1999 with right sided weakness, now with residual right arm weakness  . Stroke     TIA  . Peripheral vascular disease   . Anxiety     Past  Surgical History  Procedure Date  . Appendectomy   . Abdominal hysterectomy   . Ureteral stent placement   . Carotid endarterectomy   . Laminectomy   . Back surgery   . Colonoscopy 07/30/2011    Procedure: COLONOSCOPY;  Surgeon: Louis Meckel, MD;  Location: [facility] ENDOSCOPY;  Service: Endoscopy;  Laterality: N/A;  gi bleed  . Esophagogastroduodenoscopy 07/30/2011    Procedure: ESOPHAGOGASTRODUODENOSCOPY (EGD);  Surgeon: Louis Meckel, MD;  Location: [facility] ENDOSCOPY;  Service: Endoscopy;  Laterality: N/A;  . Pacemaker insertion      History  Substance Use Topics  . Smoking status: Current Everyday Smoker -- 0.5 packs/day for 65 years    Types: Cigarettes  . Smokeless tobacco: Never Used  . Alcohol Use: No    Review of Systems ROS: Statement: All systems negative except as marked or noted in the HPI; Constitutional: Negative for fever and chills. ; ; Eyes: Negative for eye pain, redness and discharge. ; ; ENMT: Negative for ear pain, hoarseness, nasal congestion, sinus pressure and sore throat. ; ; Cardiovascular: Negative for chest pain, palpitations, diaphoresis, dyspnea and peripheral edema. ; ; Respiratory: Negative for cough, wheezing and stridor. ; ; Gastrointestinal: Negative for nausea, vomiting,  diarrhea, abdominal pain, blood in stool, hematemesis, jaundice and rectal bleeding. . ; ; Genitourinary: Negative for dysuria, flank pain and hematuria. ; ; Musculoskeletal: Negative for back pain and neck pain. Negative for swelling and trauma.; ; Skin: Negative for pruritus, rash, abrasions, blisters, bruising and skin lesion.; ; Neuro: +generalized weakness/fatigue. Negative for headache, lightheadedness and neck stiffness. Negative for altered level of consciousness , altered mental status, extremity weakness, paresthesias, involuntary movement, seizure and syncope.     Allergies  Norvasc and Peanut-containing drug products  Home Medications   No current outpatient prescriptions on  file.  BP 185/77  Pulse 70  Temp 98.3 F (36.8 C) (Oral)  Resp 14  SpO2 99%  Physical Exam 1400: Physical examination:  Nursing notes reviewed; Vital signs and O2 SAT reviewed;  Constitutional: Well developed, Well nourished, In no acute distress; Head:  Normocephalic, atraumatic; Eyes: EOMI, PERRL, No scleral icterus; ENMT: Mouth and pharynx normal, Mucous membranes dry; Neck: Supple, Full range of motion, No lymphadenopathy; Cardiovascular: Regular rate and rhythm, No gallop; Respiratory: Breath sounds clear & equal bilaterally, No wheezes.  Speaking full sentences with ease, Normal respiratory effort/excursion; Chest: Nontender, Movement normal; Abdomen: Soft, Nontender, Nondistended, Normal bowel sounds;; Extremities: Pulses normal, No tenderness, No edema, No calf edema or asymmetry.; Neuro: AA&Ox3, Major CN grossly intact.  Speech clear. No facial droop. No gross focal motor or sensory deficits in extremities.; Skin: Color normal, Warm, Dry.   ED Course  Procedures   1635:  CBG improved with IVF. Not acidotic on ABG, AG 11.  Isolated SBP elevation continues, but pt has hx of same per EPIC chart review with SBP baseline appearing to be in 180-190's.  Will not acutely lower BP at this time.  Pt continues to deny CP/SOB, neuro exam unchanged and continues without focal deficits.  1st EKG with misplaced leads, repeat EKG with new diffuse TWI and old NS STTW changes anterior leads; no acute STEMI.  Dx testing d/w pt and family.  Questions answered.  Verb understanding, agreeable to admit.  T/C to [facility] Dr. Ashley Royalty, case discussed, including:  HPI, pertinent PM/SHx, VS/PE, dx testing, ED course and treatment:  Agreeable with ED course thus far, agrees to admit, requests to obtain tele bed to team 8/Dr. Ashley Royalty, aware MRI brain ordered and pending to f/u CT findings.   MDM  MDM Reviewed: previous chart, nursing note and vitals Reviewed previous: ECG Interpretation: ECG, labs, x-ray and CT  scan   1st EKG with misplaced leads.   Date: 10/15/2011 2nd EKG due to misplaced leads  Rate: 69  Rhythm: normal sinus rhythm  QRS Axis: normal  Intervals: normal  ST/T Wave abnormalities: nonspecific ST/T changes, diffuse TWI, NS STTW changes ant leads  Conduction Disutrbances:none  Narrative Interpretation:   Old EKG Reviewed: changes noted; diffuse TWI and NS STTW changes new since previous EKG, with NS STTW changes anterior leads unchanged from previous EKG dated 07/29/2011.    Results for orders placed during the hospital encounter of 10/15/11  CBC WITH DIFFERENTIAL      Component Value Range   WBC 4.8  4.0 - 10.5 K/uL   RBC 4.73  3.87 - 5.11 MIL/uL   Hemoglobin 15.1 (*) 12.0 - 15.0 g/dL   HCT 16.1  09.6 - 04.5 %   MCV 93.0  78.0 - 100.0 fL   MCH 31.9  26.0 - 34.0 pg   MCHC 34.3  30.0 - 36.0 g/dL   RDW 40.9  81.1 - 91.4 %  Platelets 120 (*) 150 - 400 K/uL   Neutrophils Relative 61  43 - 77 %   Neutro Abs 2.9  1.7 - 7.7 K/uL   Lymphocytes Relative 34  12 - 46 %   Lymphs Abs 1.6  0.7 - 4.0 K/uL   Monocytes Relative 4  3 - 12 %   Monocytes Absolute 0.2  0.1 - 1.0 K/uL   Eosinophils Relative 2  0 - 5 %   Eosinophils Absolute 0.1  0.0 - 0.7 K/uL   Basophils Relative 0  0 - 1 %   Basophils Absolute 0.0  0.0 - 0.1 K/uL  BASIC METABOLIC PANEL      Component Value Range   Sodium 139  135 - 145 mEq/L   Potassium 3.4 (*) 3.5 - 5.1 mEq/L   Chloride 98  96 - 112 mEq/L   CO2 30  19 - 32 mEq/L   Glucose, Bld 598 (*) 70 - 99 mg/dL   BUN 11  6 - 23 mg/dL   Creatinine, Ser 7.82  0.50 - 1.10 mg/dL   Calcium 9.6  8.4 - 95.6 mg/dL   GFR calc non Af Amer 50 (*) >90 mL/min   GFR calc Af Amer 58 (*) >90 mL/min  GLUCOSE, CAPILLARY      Component Value Range   Glucose-Capillary 530 (*) 70 - 99 mg/dL   Comment 1 Documented in Chart     Comment 2 Notify RN    URINALYSIS, ROUTINE W REFLEX MICROSCOPIC      Component Value Range   Color, Urine YELLOW  YELLOW   APPearance CLEAR  CLEAR     Specific Gravity, Urine 1.020  1.005 - 1.030   pH 7.0  5.0 - 8.0   Glucose, UA >1000 (*) NEGATIVE mg/dL   Hgb urine dipstick NEGATIVE  NEGATIVE   Bilirubin Urine NEGATIVE  NEGATIVE   Ketones, ur NEGATIVE  NEGATIVE mg/dL   Protein, ur 213 (*) NEGATIVE mg/dL   Urobilinogen, UA 0.2  0.0 - 1.0 mg/dL   Nitrite NEGATIVE  NEGATIVE   Leukocytes, UA NEGATIVE  NEGATIVE  TROPONIN I      Component Value Range   Troponin I <0.30  <0.30 ng/mL  URINE MICROSCOPIC-ADD ON      Component Value Range   Squamous Epithelial / LPF FEW (*) RARE   WBC, UA 0-2  <3 WBC/hpf  GLUCOSE, CAPILLARY      Component Value Range   Glucose-Capillary 388 (*) 70 - 99 mg/dL  POCT I-STAT 3, BLOOD GAS (G3+)      Component Value Range   pH, Arterial 7.435  7.350 - 7.450   pCO2 arterial 53.5 (*) 35.0 - 45.0 mmHg   pO2, Arterial 65.0 (*) 80.0 - 100.0 mmHg   Bicarbonate 35.9 (*) 20.0 - 24.0 mEq/L   TCO2 37  0 - 100 mmol/L   O2 Saturation 93.0     Acid-Base Excess 10.0 (*) 0.0 - 2.0 mmol/L   Patient temperature 98.6 F     Collection site RADIAL, ALLEN'S TEST ACCEPTABLE     Drawn by Operator     Sample type ARTERIAL    GLUCOSE, CAPILLARY      Component Value Range   Glucose-Capillary 432 (*) 70 - 99 mg/dL   Dg Chest 2 View 0/10/6576  *RADIOLOGY REPORT*  Clinical Data: Weakness  CHEST - 2 VIEW  Comparison: 07/20/2011  Findings: Increased interstitial markings.  No frank interstitial edema.  No pleural effusion or pneumothorax.  The heart  is top normal in size.  Left subclavian pacemaker.  Degenerative changes of the visualized thoracolumbar spine.  IMPRESSION: No evidence of acute cardiopulmonary disease.  Original Report Authenticated By: Charline Bills, M.D.   Ct Head Wo Contrast 10/15/2011  *RADIOLOGY REPORT*  Clinical Data: Altered mental status  CT HEAD WITHOUT CONTRAST  Technique:  Contiguous axial images were obtained from the base of the skull through the vertex without contrast.  Comparison: 03/28/2011  Findings:  Chronic ischemic changes in the basal ganglia and periventricular white matter.  White matter lesion involving the corpus callosum is suspected and is not significantly changed. Ischemic changes in the right pons is a new finding.  Age is indeterminate.  No mass effect, midline shift, or acute intracranial hemorrhage mastoid air cells are clear.  Cranium is intact.  IMPRESSION: New infarct in the right pons.  Age is indeterminate.  MRI may be helpful to further characterize.  Chronic ischemic changes.  Original Report Authenticated By: Donavan Burnet, M.D.           Laray Anger, DO 10/16/11 1357

## 2011-10-15 NOTE — H&P (Signed)
Hospital Admission Note Date: 10/15/2011  Patient name: Carmen [name] Medical record number: [id number] Date of birth: 1933/12/19 Age: 78 y.o. Gender: female PCP: Gwynneth Aliment, MD  Attending physician: Altha Harm, MD  Chief Complaint: Sent her primary care physician's office for elevated blood pressure.  History of Present Illness: This is a 78 year old female with history of hypertension and diabetes who sent her to the ER by her primary care physician's office when she was noted to have a markedly elevated blood pressure. The patient states that she was out of her Levemir for diabetes and went to the physician's office to see she could obtain a sample. She states that while at physician's office her blood pressure was checked and was found to be markedly elevated. The direct her to come to the emergency room for further evaluation and management. The patient herself denies any complaints however her daughter states that she's noted in the last 2 days the patient has been more sleepy than usual although she is arousable and has not had any loss of consciousness or disorientation. Patient denies any chest pain, dizziness, fever, chills, nausea, vomiting or diarrhea.  In the emergency room the patient was found to have a markedly elevated blood sugar at 530. She was also found to have an elevated blood pressure at 223/88. Please note that the patient states that her blood pressure usually resides around 185 systolic. A CT scan of her head also revealed a new infarct in the right pons age-indeterminate.  Scheduled Meds:   . hydrALAZINE  50 mg Oral QID  . insulin aspart  20 Units Subcutaneous Once  . sodium chloride  500 mL Intravenous Once  . sodium chloride  500 mL Intravenous Once   Continuous Infusions:   . sodium chloride 75 mL/hr (10/15/11 1806)  . insulin (NOVOLIN-R) infusion     PRN Meds:. Allergies: Norvasc and Peanut-containing drug products Past Medical History    Diagnosis Date  . Diabetes mellitus   . Hypertension   . Coronary artery disease   . Renal disorder   . Herniated lumbar intervertebral disc   . Cataract     cataract surgery scheduled for 03/31/11, 2 - left eye, 1 - right eye  . Renovascular hypertension      s/p left renal artery stent 12/2007.   S/P balloon angioplasty on 02/16/10 for ISR, BP was  controlled well since then.     . Claudication in peripheral vascular disease     02/16/10: Left CIA 9.0x28 Omnilink and REIA 8.0x40 seff expanding Zilver.   right subclavian artery stent 03/18/2008,  . S/P carotid endarterectomy      left carotid endarterectomy  1991; Stroke and TIA in 1999 with right sided weakness, now with residual right arm weakness  . Stroke     TIA  . Peripheral vascular disease   . Anxiety    Past Surgical History  Procedure Date  . Appendectomy   . Abdominal hysterectomy   . Ureteral stent placement   . Carotid endarterectomy   . Laminectomy   . Back surgery   . Colonoscopy 07/30/2011    Procedure: COLONOSCOPY;  Surgeon: Louis Meckel, MD;  Location[facility] ENDOSCOPY;  Service: Endoscopy;  Laterality: N/A;  gi bleed  . Esophagogastroduodenoscopy 07/30/2011    Procedure: ESOPHAGOGASTRODUODENOSCOPY (EGD);  Surgeon: Louis Meckel, MD;  Location: [facility] ENDOSCOPY;  Service: Endoscopy;  Laterality: N/A;  . Pacemaker insertion    History reviewed. No pertinent family history. History  Social History  . Marital Status: Married    Spouse Name: N/A    Number of Children: N/A  . Years of Education: N/A   Occupational History  . Not on file.   Social History Main Topics  . Smoking status: Current Everyday Smoker -- 0.5 packs/day for 65 years    Types: Cigarettes  . Smokeless tobacco: Never Used  . Alcohol Use: No  . Drug Use: No  . Sexually Active: No   Other Topics Concern  . Not on file   Social History Narrative  . No narrative on file   Review of Systems: A comprehensive review of systems was  negative. Physical Exam: No intake or output data in the 24 hours ending 10/15/11 2023 General: Alert, awake, oriented x3, in no acute distress.  HEENT: [place]/AT PEERL, EOMI Neck: Trachea midline,  no masses, no thyromegal,y no JVD, no carotid bruit OROPHARYNX:  Moist, No exudate/ erythema/lesions.  Heart: Regular rate and rhythm, without murmurs, rubs, gallops, PMI non-displaced, no heaves or thrills on palpation.  Lungs: Clear to auscultation, no wheezing or rhonchi noted. No increased vocal fremitus resonant to percussion  Abdomen: Soft, nontender, nondistended, positive bowel sounds, no masses no hepatosplenomegaly noted..  Neuro: No focal neurological deficits noted cranial nerves II through XII grossly intact. DTRs 2+ bilaterally upper and lower extremities. Strength 5 out of 5 in bilateral upper and lower extremities. Musculoskeletal: No warm swelling or erythema around joints, no spinal tenderness noted. Psychiatric: Patient alert and oriented x3, good insight and cognition, good recent to remote recall. Lymph node survey: No cervical axillary or inguinal lymphadenopathy noted.  Lab results:  [facility] 10/15/11 1307  NA 139  K 3.4*  CL 98  CO2 30  GLUCOSE 598*  BUN 11  CREATININE 1.06  CALCIUM 9.6  MG --  PHOS --   No results found for this basename: AST:2,ALT:2,ALKPHOS:2,BILITOT:2,PROT:2,ALBUMIN:2 in the last 72 hours No results found for this basename: LIPASE:2,AMYLASE:2 in the last 72 hours  Basename 10/15/11 1307  WBC 4.8  NEUTROABS 2.9  HGB 15.1*  HCT 44.0  MCV 93.0  PLT 120*    Basename 10/15/11 1456  CKTOTAL --  CKMB --  CKMBINDEX --  TROPONINI <0.30   No components found with this basename: POCBNP:3 No results found for this basename: DDIMER:2 in the last 72 hours No results found for this basename: HGBA1C:2 in the last 72 hours No results found for this basename: CHOL:2,HDL:2,LDLCALC:2,TRIG:2,CHOLHDL:2,LDLDIRECT:2 in the last 72 hours No results found  for this basename: TSH,T4TOTAL,FREET3,T3FREE,THYROIDAB in the last 72 hours No results found for this basename: VITAMINB12:2,FOLATE:2,FERRITIN:2,TIBC:2,IRON:2,RETICCTPCT:2 in the last 72 hours Imaging results:  Dg Chest 2 View  10/15/2011  *RADIOLOGY REPORT*  Clinical Data: Weakness  CHEST - 2 VIEW  Comparison: 07/20/2011  Findings: Increased interstitial markings.  No frank interstitial edema.  No pleural effusion or pneumothorax.  The heart is top normal in size.  Left subclavian pacemaker.  Degenerative changes of the visualized thoracolumbar spine.  IMPRESSION: No evidence of acute cardiopulmonary disease.  Original Report Authenticated By: Charline Bills, M.D.   Ct Head Wo Contrast  10/15/2011  *RADIOLOGY REPORT*  Clinical Data: Altered mental status  CT HEAD WITHOUT CONTRAST  Technique:  Contiguous axial images were obtained from the base of the skull through the vertex without contrast.  Comparison: 03/28/2011  Findings: Chronic ischemic changes in the basal ganglia and periventricular white matter.  White matter lesion involving the corpus callosum is suspected and is not significantly changed. Ischemic  changes in the right pons is a new finding.  Age is indeterminate.  No mass effect, midline shift, or acute intracranial hemorrhage mastoid air cells are clear.  Cranium is intact.  IMPRESSION: New infarct in the right pons.  Age is indeterminate.  MRI may be helpful to further characterize.  Chronic ischemic changes.  Original Report Authenticated By: Donavan Burnet, M.D.   Other results: EKG: normal EKG, normal sinus rhythm, unchanged from previous tracings.   Patient Active Hospital Problem List: No active hospital problems. 1. Malignant hypertension: The patient has lived in hypertension however this is only approximately 20-25 points greater than her usual baseline. However it is concerning the patient has a possible CVA in the setting of an elevated blood pressure. I will resume the  patient's usual blood pressure medications as Jacki Cones her blood pressures too quickly could be detrimental to this patient. The patient's blood pressures should not be any lower than a systolic of 185.  2. Uncontrolled diabetes: The patient presents in the hyperosmolar nonketotic state and no evidence of diabetic ketoacidosis. The patient has received IV insulin in the emergency room. This and he discontinue the patient reported Levemir with sliding scale. Her blood sugar still stay about 400 mL consider putting her glucose stabilizer.  3. CVA age-indeterminate: The patient presents with a CVA which is new from the last radiologic study of her head. It is unclear whether or not this acute CVA. A repeat CT will be ordered in 48 hours if the patient is unable to obtain an MRI secondary to pacemaker. If findings are consistent with an acute CVA then she will have a workup appropriate for that. In the meantime the patient was started on aspirin and continued on her statin.  MATTHEWS,MICHELLE A. 10/15/2011, 8:23 PM

## 2011-10-15 NOTE — ED Notes (Addendum)
Spoke to Dr. Ashley Royalty and informed her about patient's elevated CBG at 432 and elevated BP and was instructed to give her BP meds and saline her IV. See new order.

## 2011-10-15 NOTE — ED Notes (Signed)
Dr. Clarene Duke was made aware that the patient's CBG dropped from 530 to 386 with just the NS bolus without the insulin drip. Dr. Clarene Duke instructed RN to just keep the patient on the IV fluids for now and that the Hospitalist is coming to see the patient.

## 2011-10-15 NOTE — ED Notes (Signed)
Dr. Ashley Royalty came to see the patient.

## 2011-10-15 NOTE — ED Notes (Signed)
Pt c/o AMS and lethargy x 2 days; pt out of insulin and htn meds; per family pt lethargic today; pt sts CBG was 500 and BP elevated

## 2011-10-15 NOTE — ED Notes (Signed)
Unable to perform MRI, patient has a pacemaker.

## 2011-10-16 DIAGNOSIS — R7309 Other abnormal glucose: Secondary | ICD-10-CM

## 2011-10-16 LAB — BASIC METABOLIC PANEL
BUN: 11 mg/dL (ref 6–23)
Calcium: 8.7 mg/dL (ref 8.4–10.5)
Chloride: 102 mEq/L (ref 96–112)
Creatinine, Ser: 0.81 mg/dL (ref 0.50–1.10)
GFR calc Af Amer: 80 mL/min — ABNORMAL LOW (ref >90)

## 2011-10-16 LAB — GLUCOSE, CAPILLARY
Glucose-Capillary: 137 mg/dL — ABNORMAL HIGH (ref 70–99)
Glucose-Capillary: 149 mg/dL — ABNORMAL HIGH (ref 70–99)
Glucose-Capillary: 381 mg/dL — ABNORMAL HIGH (ref 70–99)
Glucose-Capillary: 165 mg/dL — ABNORMAL HIGH (ref 70–99)

## 2011-10-16 LAB — POTASSIUM: Potassium: 3.4 mEq/L — ABNORMAL LOW (ref 3.5–5.1)

## 2011-10-16 MED ORDER — POTASSIUM CHLORIDE CRYS ER 20 MEQ PO TBCR
40.0000 meq | EXTENDED_RELEASE_TABLET | Freq: Two times a day (BID) | ORAL | Status: DC
  Administered 2011-10-16 – 2011-10-18 (×5): 40 meq via ORAL
  Filled 2011-10-16 (×6): qty 2

## 2011-10-16 MED ORDER — GLUCERNA SHAKE PO LIQD
237.0000 mL | Freq: Two times a day (BID) | ORAL | Status: DC
  Administered 2011-10-16 – 2011-10-18 (×4): 237 mL via ORAL

## 2011-10-16 MED ORDER — POTASSIUM CHLORIDE 10 MEQ/100ML IV SOLN
10.0000 meq | INTRAVENOUS | Status: AC
  Administered 2011-10-16 (×2): 10 meq via INTRAVENOUS
  Filled 2011-10-16 (×2): qty 100

## 2011-10-16 MED ORDER — POTASSIUM CHLORIDE CRYS ER 20 MEQ PO TBCR
40.0000 meq | EXTENDED_RELEASE_TABLET | Freq: Once | ORAL | Status: AC
  Administered 2011-10-16: 40 meq via ORAL

## 2011-10-16 MED ORDER — POTASSIUM CHLORIDE CRYS ER 20 MEQ PO TBCR: 40.0000 meq | EXTENDED_RELEASE_TABLET | Freq: Two times a day (BID) | ORAL | Status: DC

## 2011-10-16 NOTE — Progress Notes (Signed)
Lab reports K+ of 2.5 and blood sugar of 38 this AM. MD on call notified. Orders received. Lurline Idol [facility]

## 2011-10-16 NOTE — Consult Note (Signed)
Referring Physician: Marthann Schiller    Chief Complaint: Possible CVA, Accelerated HTN  HPI: Carmen [name] is an 78 y.o. female admitted to [facility] 10/15/11 due to uncontrolled HTN (223/88) and marked hyperglycemia (530); patient had been out of her medicines for ~1 week. Patient has known suboptimally controlled HTN (SBP 180/s) and DM2. Due to c/o AMS and increased fatigue, head CT was ordered.  This revealed an infarct in the right pons, age indeterminate. Patient cannot have MRI due to PTVPM.   Neurology consult requested.    Patient states, "I don't think I've had a stroke.  I feel fine."  Past Medical History  Diagnosis Date  . Diabetes mellitus   . Hypertension   . Coronary artery disease   . Renal disorder   . Herniated lumbar intervertebral disc   . Cataract     cataract surgery scheduled for 03/31/11, 2 - left eye, 1 - right eye  . Renovascular hypertension      s/p left renal artery stent 12/2007.   S/P balloon angioplasty on 02/16/10 for ISR, BP was  controlled well since then.     . Claudication in peripheral vascular disease     02/16/10: Left CIA 9.0x28 Omnilink and REIA 8.0x40 seff expanding Zilver.   right subclavian artery stent 03/18/2008,  . S/P carotid endarterectomy      left carotid endarterectomy  1991; Stroke and TIA in 1999 with right sided weakness, now with residual right arm weakness  . Stroke     TIA  . Peripheral vascular disease   . Anxiety     Past Surgical History  Procedure Date  . Appendectomy   . Abdominal hysterectomy   . Ureteral stent placement   . Carotid endarterectomy   . Laminectomy   . Back surgery   . Colonoscopy 07/30/2011    Procedure: COLONOSCOPY;  Surgeon: Louis Meckel, MD;  Location[facility] ENDOSCOPY;  Service: Endoscopy;  Laterality: N/A;  gi bleed  . Esophagogastroduodenoscopy 07/30/2011    Procedure: ESOPHAGOGASTRODUODENOSCOPY (EGD);  Surgeon: Louis Meckel, MD;  Location: [facility] ENDOSCOPY;  Service: Endoscopy;  Laterality: N/A;  .  Pacemaker insertion     History reviewed. No pertinent family history. Social History:  Smokes cigarettes.  She has a 32.5 pack-year smoking history. No smokeless tobacco. She reports that she does not drink alcohol or use illicit drugs.  Allergies:  Allergies  Allergen Reactions  . Norvasc (Amlodipine Besylate) Swelling  . Peanut-Containing Drug Products Other (See Comments)    Cause my stomach to hurt    Medications: Scheduled:   . aspirin  81 mg Oral Daily  . atorvastatin  20 mg Oral q1800  . cilostazol  100 mg Oral BID  . enoxaparin (LOVENOX) injection  40 mg Subcutaneous Q24H  . hydrALAZINE  50 mg Oral QID  . insulin aspart  0-15 Units Subcutaneous TID WC  . insulin aspart  0-5 Units Subcutaneous QHS  . insulin aspart  20 Units Subcutaneous Once  . insulin aspart  3 Units Subcutaneous TID WC  . insulin detemir  5-7 Units Subcutaneous BID  . isosorbide mononitrate  60 mg Oral Daily  . losartan  50 mg Oral Daily  . losartan  50 mg Oral Daily  . magnesium chloride  1 tablet Oral Daily  . metoprolol tartrate  25 mg Oral BID  . potassium chloride  10 mEq Intravenous Q1 Hr x 2  . potassium chloride  40 mEq Oral BID  . sodium chloride  500  mL Intravenous Once  . sodium chloride  500 mL Intravenous Once  . sodium chloride  3 mL Intravenous Q12H  . sodium chloride  3 mL Intravenous Q12H  . DISCONTD: losartan  50 mg Oral Daily  . DISCONTD: potassium chloride SA  20 mEq Oral BID  . DISCONTD: potassium chloride  40 mEq Oral BID   ROS: Denies fever, chills, weight gain or loss. Mild anorexia, nausea and fatigue last 1-2 days.  No chest pain, shortness of breath, palpitation.  No syncope or presyncope. No dizziness, HA, visual disturbance or tinnitus.  No falls or loss of balance.  No extremity weakness and paresthesia.  No difficulty swallowing or talking.  No confusion or disorientation.  No v/d/c.  Physical Examination: Blood pressure 179/87, pulse 75, temperature 98 F (36.7  C), temperature source Oral, resp. rate 16, height 5\' 3"  (1.6 m), weight 47.3 kg (104 lb 4.4 oz), SpO2 94.00%. Telemetry: SR  Neurologic Examination: Mental Status: Alert, oriented, thought content appropriate.  Speech fluent without evidence of aphasia. Able to follow 3 step commands without difficulty.  Repetition and naming intact. Cranial Nerves: II- Visual fields grossly intact. III/IV/VI-Extraocular movements intact.  Pupils reactive bilaterally. V/VII-Smile symmetric.  Facial sensation intact. VIII-grossly intact IX/X-not examined. XI-bilateral shoulder shrug intact XII-midline tongue extension Motor: 4/5 RUE strength with decreased ROM at shoulder.  Slight decreased grip strength in the right. Some atrophy of intrinsic muscles right hand and extensor contraction.  5/5 LUE nl tone/ bulk LUE.  4/5 RLE,  5/5 LLE.  Right arm drifts.  Sensory:  light touch diminished on right hemibody (chronic).   Deep Tendon Reflexes: 2+ and symmetric throughout Plantars: Downgoing bilaterally Cerebellar: Normal finger-to-nose, normal rapid alternating movements slower on the RUE.  Normal heel-to-shin test.   Basic Metabolic Panel:  Lab 10/16/11 1610 10/15/11 2102 10/15/11 1307  NA 145 -- 139  K 2.5* -- 3.4*  CL 102 -- 98  CO2 37* -- 30  GLUCOSE 38* -- 598*  BUN 11 -- 11  CREATININE 0.81 0.91 --  CALCIUM 8.7 -- 9.6  MG -- -- --  PHOS -- -- --   CBC:  Lab 10/15/11 2102 10/15/11 1307  WBC 4.8 4.8  NEUTROABS -- 2.9  HGB 14.3 15.1*  HCT 40.8 44.0  MCV 92.7 93.0  PLT 111* 120*   Cardiac Enzymes:  Lab 10/15/11 1456  CKTOTAL --  CKMB --  CKMBINDEX --  TROPONINI <0.30   CBG:  Lab 10/16/11 1137 10/16/11 0801 10/16/11 0717 10/16/11 0156 10/15/11 2053 10/15/11 1813  GLUCAP 149* 110* 43* 137* 460* 432*   Hemoglobin A1C:  Lab 10/15/11 2102  HGBA1C 12.2*   Urine Drug Screen: Drugs of Abuse     Component Value Date/Time   LABOPIA NONE DETECTED 04/11/2009 1112   COCAINSCRNUR NONE  DETECTED 04/11/2009 1112   LABBENZ NONE DETECTED 04/11/2009 1112   AMPHETMU NONE DETECTED 04/11/2009 1112   THCU NONE DETECTED 04/11/2009 1112   LABBARB NONE DETECTED 04/11/2009 1112   Urinalysis:  Lab 10/15/11 1610  COLORURINE YELLOW  LABSPEC 1.020  PHURINE 7.0  GLUCOSEU >1000*  HGBUR NEGATIVE  BILIRUBINUR NEGATIVE  KETONESUR NEGATIVE  PROTEINUR 100*  UROBILINOGEN 0.2  NITRITE NEGATIVE  LEUKOCYTESUR NEGATIVE    Imaging/Studies: 10/15/2011   CHEST - 2 VIEW Findings: Increased interstitial markings.  No frank interstitial edema.  No pleural effusion or pneumothorax.  The heart is top normal in size.  Left subclavian pacemaker.  Degenerative changes of the visualized thoracolumbar spine.  IMPRESSION: No evidence of acute cardiopulmonary disease.   Charline Bills, M.D.   10/15/2011   CT HEAD WITHOUT CONTRAST  Findings: Chronic ischemic changes in the basal ganglia and periventricular white matter.  White matter lesion involving the corpus callosum is suspected and is not significantly changed. Ischemic changes in the right pons is a new finding.  Age is indeterminate.  No mass effect, midline shift, or acute intracranial hemorrhage mastoid air cells are clear.  Cranium is intact.  IMPRESSION: New infarct in the right pons.  Age is indeterminate.  MRI may be helpful to further characterize.  Chronic ischemic changes. Donavan Burnet, M.D.   Assessment: 78 y.o. female with AMS and lethargy in setting of uncontrolled HTN and DM.  Head CT revealed right pons infarct, age undetermined, but new from last head CT 03/28/11. Unable to perform MRI due to pacer.  Stroke risk factors: HTN, DM, tobacco  Plan: 1. HgbA1c, fasting lipid panel 2. Repeat head CT not indicated, as acute stroke not suspected and patient has returned to baseline. 3. PT consult, OT consult, Speech consult 4. Echocardiogram 5. Carotid dopplers 6. Prophylactic therapy-asa 81 mg daily. 7. Risk factor modification 8. Telemetry  monitoring  Thank you for allowing [place] to assist in the management of this patient.  We will follow with you.  Marya Fossa PA-C [facility] [phone] 10/16/2011, 1:54 PM   I have seen this patient and agree with the above note.   Ritta Slot, MD [facility] [phone]

## 2011-10-16 NOTE — Progress Notes (Signed)
[facility] PROGRESS NOTE  Carmen [name] AVW:[id number] DOB: 11-14-1933 DOA: 10/15/2011 PCP: Gwynneth Aliment, MD  Assessment/Plan: Active Problems:  HTN (hypertension), malignant  Diabetic hyperosmolar non-ketotic state  CVA (cerebral infarction)  1. Hypertension: Patient's blood pressures are at the usual level for this patient. I would not add any further antihypertensive agents at will defer to her primary cardiologist as an outpatient for further titration of her medications. 2. Diabetes type 2: Patient's blood sugars have been eratic ranging from 43-432. I will continue to observe her blood sugars through the next 24 hours and make adjustments as needed. 3. CVA versus possible CVA: The patient was seen by neurology today. They're not convinced that this patient actually had a CVA despite the findings on CT scan. However we'll proceed with the stratification evaluating carotids and obtain a lipid panel. 4. Hypokalemia: Potassium replaced by IV and orally. Repeat potassium 3.4 we'll continue to replace orally.  Code Status: Full code Family Communication: Attempt to reach the patient's daughter to update her but was unable. Disposition Plan: Anticipate discharge to home at the time of discharge  MATTHEWS,MICHELLE A.  [facility] Pager [phone]. If 8PM-8AM, please contact night-coverage at [email or web address], password [facility] 10/16/2011, 7:18 PM  LOS: 1 day   Brief narrative: This is a 78 year old female with history of hypertension and diabetes who sent her to the ER by her primary care physician's office when she was noted to have a markedly elevated blood pressure. The patient states that she was out of her Levemir for diabetes and went to the physician's office to see she could obtain a sample. She states that while at physician's office her blood pressure was checked and was found to be markedly elevated. The direct her to come to the emergency room for further evaluation and  management. The patient herself denies any complaints however her daughter states that she's noted in the last 2 days the patient has been more sleepy than usual although she is arousable and has not had any loss of consciousness or disorientation. Patient denies any chest pain, dizziness, fever, chills, nausea, vomiting or diarrhea.   Consultants:  Neurology  Procedures:  None  Antibiotics:  None  HPI/Subjective: Patient states that she feels much better today.  Objective: Filed Vitals:   10/16/11 0900 10/16/11 0944 10/16/11 1355 10/16/11 1854  BP:  179/87 176/76 120/64  Pulse:  75 69 90  Temp:  98 F (36.7 C) 98.6 F (37 C) 98.5 F (36.9 C)  TempSrc:  Oral Oral Oral  Resp:  16 16 18   Height: 5\' 3"  (1.6 m)     Weight:      SpO2:  94% 95% 96%   Weight change:  No intake or output data in the 24 hours ending 10/16/11 1918  General: Alert, awake, oriented x3, in no acute distress.  HEENT: [place]/AT PEERL, EOMI Heart: Regular rate and rhythm, without murmurs, rubs, gallops, PMI non-displaced, no heaves or thrills on palpation.  Lungs: Clear to auscultation, no wheezing or rhonchi noted. No increased vocal fremitus resonant to percussion  Abdomen: Soft, nontender, nondistended, positive bowel sounds, no masses no hepatosplenomegaly noted..  Neuro: No focal neurological deficits noted cranial nerves II through XII grossly intact. DTRs 2+ bilaterally upper and lower extremities. Strength functional and equal in bilateral upper and lower extremities. Musculoskeletal: No warm swelling or erythema around joints, no spinal tenderness noted.   Data Reviewed: Basic Metabolic Panel:  Lab 10/16/11 3086 10/16/11 5784 10/15/11 2102 10/15/11 1307  NA -- 145 -- 139  K 3.4* 2.5* -- 3.4*  CL -- 102 -- 98  CO2 -- 37* -- 30  GLUCOSE -- 38* -- 598*  BUN -- 11 -- 11  CREATININE -- 0.81 0.91 1.06  CALCIUM -- 8.7 -- 9.6  MG -- -- -- --  PHOS -- -- -- --   Liver Function Tests: No  results found for this basename: AST:5,ALT:5,ALKPHOS:5,BILITOT:5,PROT:5,ALBUMIN:5 in the last 168 hours No results found for this basename: LIPASE:5,AMYLASE:5 in the last 168 hours No results found for this basename: AMMONIA:5 in the last 168 hours CBC:  Lab 10/15/11 2102 10/15/11 1307  WBC 4.8 4.8  NEUTROABS -- 2.9  HGB 14.3 15.1*  HCT 40.8 44.0  MCV 92.7 93.0  PLT 111* 120*   Cardiac Enzymes:  Lab 10/15/11 1456  CKTOTAL --  CKMB --  CKMBINDEX --  TROPONINI <0.30   BNP (last 3 results) No results found for this basename: PROBNP:3 in the last 8760 hours CBG:  Lab 10/16/11 1718 10/16/11 1137 10/16/11 0801 10/16/11 0717 10/16/11 0156  GLUCAP 381* 149* 110* 43* 137*    No results found for this or any previous visit (from the past 240 hour(s)).   Studies: Dg Chest 2 View  10/15/2011  *RADIOLOGY REPORT*  Clinical Data: Weakness  CHEST - 2 VIEW  Comparison: 07/20/2011  Findings: Increased interstitial markings.  No frank interstitial edema.  No pleural effusion or pneumothorax.  The heart is top normal in size.  Left subclavian pacemaker.  Degenerative changes of the visualized thoracolumbar spine.  IMPRESSION: No evidence of acute cardiopulmonary disease.  Original Report Authenticated By: Charline Bills, M.D.   Ct Head Wo Contrast  10/15/2011  *RADIOLOGY REPORT*  Clinical Data: Altered mental status  CT HEAD WITHOUT CONTRAST  Technique:  Contiguous axial images were obtained from the base of the skull through the vertex without contrast.  Comparison: 03/28/2011  Findings: Chronic ischemic changes in the basal ganglia and periventricular white matter.  White matter lesion involving the corpus callosum is suspected and is not significantly changed. Ischemic changes in the right pons is a new finding.  Age is indeterminate.  No mass effect, midline shift, or acute intracranial hemorrhage mastoid air cells are clear.  Cranium is intact.  IMPRESSION: New infarct in the right pons.  Age is  indeterminate.  MRI may be helpful to further characterize.  Chronic ischemic changes.  Original Report Authenticated By: Donavan Burnet, M.D.    Scheduled Meds:   . aspirin  81 mg Oral Daily  . atorvastatin  20 mg Oral q1800  . cilostazol  100 mg Oral BID  . enoxaparin (LOVENOX) injection  40 mg Subcutaneous Q24H  . feeding supplement  237 mL Oral BID BM  . hydrALAZINE  50 mg Oral QID  . insulin aspart  0-15 Units Subcutaneous TID WC  . insulin aspart  0-5 Units Subcutaneous QHS  . insulin aspart  20 Units Subcutaneous Once  . insulin aspart  3 Units Subcutaneous TID WC  . insulin detemir  5-7 Units Subcutaneous BID  . isosorbide mononitrate  60 mg Oral Daily  . losartan  50 mg Oral Daily  . losartan  50 mg Oral Daily  . magnesium chloride  1 tablet Oral Daily  . metoprolol tartrate  25 mg Oral BID  . potassium chloride  10 mEq Intravenous Q1 Hr x 2  . potassium chloride  40 mEq Oral BID  . sodium chloride  3 mL Intravenous Q12H  .  sodium chloride  3 mL Intravenous Q12H  . DISCONTD: losartan  50 mg Oral Daily  . DISCONTD: potassium chloride SA  20 mEq Oral BID  . DISCONTD: potassium chloride  40 mEq Oral BID   Continuous Infusions:   . DISCONTD: sodium chloride 75 mL/hr (10/15/11 1806)  . DISCONTD: insulin (NOVOLIN-R) infusion      Active Problems:  HTN (hypertension), malignant  Diabetic hyperosmolar non-ketotic state  CVA (cerebral infarction)

## 2011-10-16 NOTE — Progress Notes (Signed)
Received call from CSW for medication assistance. Called and spoke with the floor nurse who states that the night nurse put consult in. Pt reports she ran out of her meds. Patient has [employer] so there is no assistance from [employer]. Depending on what she is discharged on we could look on [email or web address] to determine if there are any assistance programs or coupons. Please re-consult CM once you know what medications the patient will be discharged on so to search [email or web address]. Genella Rife Weeks 10/16/2011 1005am

## 2011-10-16 NOTE — Progress Notes (Signed)
INITIAL ADULT NUTRITION ASSESSMENT Date: 10/16/2011   Time: 2:45 PM  Reason for Assessment: Consult, patient appears malnourished  INTERVENTION: 1. Glucerna shake BID between meals. 2. RD to follow plan of care  ASSESSMENT: Female 78 y.o.  Dx: Malignant hypertension  Hx:  Past Medical History  Diagnosis Date  . Diabetes mellitus   . Hypertension   . Coronary artery disease   . Renal disorder   . Herniated lumbar intervertebral disc   . Cataract     cataract surgery scheduled for 03/31/11, 2 - left eye, 1 - right eye  . Renovascular hypertension      s/p left renal artery stent 12/2007.   S/P balloon angioplasty on 02/16/10 for ISR, BP was  controlled well since then.     . Claudication in peripheral vascular disease     02/16/10: Left CIA 9.0x28 Omnilink and REIA 8.0x40 seff expanding [place]il[place].   right subclavian artery stent 03/18/2008,  . S/P carotid endarterectomy      left carotid endarterectomy  1991; Stroke and TIA in 1999 with right sided weakness, now with residual right arm weakness  . Stroke     TIA  . Peripheral vascular disease   . Anxiety     Related Meds:     . aspirin  81 mg Oral Daily  . atorvastatin  20 mg Oral q1800  . cilostazol  100 mg Oral BID  . enoxaparin (LOVENOX) injection  40 mg Subcutaneous Q24H  . hydrALAZINE  50 mg Oral QID  . insulin aspart  0-15 Units Subcutaneous TID WC  . insulin aspart  0-5 Units Subcutaneous QHS  . insulin aspart  20 Units Subcutaneous Once  . insulin aspart  3 Units Subcutaneous TID WC  . insulin detemir  5-7 Units Subcutaneous BID  . isosorbide mononitrate  60 mg Oral Daily  . losartan  50 mg Oral Daily  . losartan  50 mg Oral Daily  . magnesium chloride  1 tablet Oral Daily  . metoprolol tartrate  25 mg Oral BID  . potassium chloride  10 mEq Intravenous Q1 Hr x 2  . potassium chloride  40 mEq Oral BID  . sodium chloride  500 mL Intravenous Once  . sodium chloride  500 mL Intravenous Once  . sodium chloride  3  mL Intravenous Q12H  . sodium chloride  3 mL Intravenous Q12H  . DISCONTD: losartan  50 mg Oral Daily  . DISCONTD: potassium chloride SA  20 mEq Oral BID  . DISCONTD: potassium chloride  40 mEq Oral BID    Ht: 5\' 3"  (160 cm)  Wt: 104 lb 4.4 oz (47.3 kg)  Ideal Wt: 52.4 kg  % Ideal Wt: 90%  Usual Wt: Unknown % Usual Wt: Unknown  Body mass index is 18.47 kg/(m^2). Patient is underweight  Food/Nutrition Related Hx: Patient at nutrition risk due to malnourished appearance, fair appetite PTA.  Labs:  CMP     Component Value Date/Time   NA 145 10/16/2011 0621   K 2.5* 10/16/2011 0621   CL 102 10/16/2011 0621   CO2 37* 10/16/2011 0621   GLUCOSE 38* 10/16/2011 0621   BUN 11 10/16/2011 0621   CREATININE 0.81 10/16/2011 0621   CALCIUM 8.7 10/16/2011 0621   PROT 4.5* 07/30/2011 0300   ALBUMIN 2.4* 07/30/2011 0300   AST 34 07/30/2011 0300   ALT 34 07/30/2011 0300   ALKPHOS 50 07/30/2011 0300   BILITOT 0.3 07/30/2011 0300   GFRNONAA 69* 10/16/2011 7846  GFRAA 80* 10/16/2011 0621   No intake or output data in the 24 hours ending 10/16/11 1447   Diet Order: Carbohydrate modified, 100% of lunch  Supplements/Tube Feeding: None  IVF:    DISCONTD: sodium chloride Last Rate: 75 mL/hr (10/15/11 1806)  DISCONTD: insulin (NOVOLIN-R) infusion     Estimated Nutritional Needs:   Kcal: 1225-1325 kcal Protein: 70-80 g Fluid: 1.4 L  Patient reports that her appetite has been fair PTA, possibly due to not taking medications, but has improved. She reports a slow weight loss of about 35 pounds over several years, but she is unable to specify if her weight is stable. Suspect some degree of malnutrition due to general appearance.   NUTRITION DIAGNOSIS: -Predicted suboptimal energy intake (NI-1.6).  Status: Ongoing  RELATED TO: decreased appetite  AS EVIDENCE BY: recent history of fair PO intake  MONITORING/EVALUATION(Goals): Patient will meet 90-100% of estimated nutrition needs  Monitor: PO  intake, labs, weight  EDUCATION NEEDS: -No education needs identified at this time DOCUMENTATION CODES Per approved criteria  -Underweight    Fabio Pierce 10/16/2011, 2:45 PM

## 2011-10-17 ENCOUNTER — Inpatient Hospital Stay (HOSPITAL_COMMUNITY): Payer: Medicare Other

## 2011-10-17 DIAGNOSIS — E876 Hypokalemia: Secondary | ICD-10-CM

## 2011-10-17 DIAGNOSIS — Z8673 Personal history of transient ischemic attack (TIA), and cerebral infarction without residual deficits: Secondary | ICD-10-CM

## 2011-10-17 LAB — LIPID PANEL
HDL: 62 mg/dL (ref >39)
LDL Cholesterol: 28 mg/dL (ref 0–99)

## 2011-10-17 LAB — BASIC METABOLIC PANEL
CO2: 28 mEq/L (ref 19–32)
Calcium: 8.1 mg/dL — ABNORMAL LOW (ref 8.4–10.5)
Glucose, Bld: 118 mg/dL — ABNORMAL HIGH (ref 70–99)
Potassium: 3 mEq/L — ABNORMAL LOW (ref 3.5–5.1)
Sodium: 136 mEq/L (ref 135–145)

## 2011-10-17 LAB — URINE CULTURE

## 2011-10-17 LAB — GLUCOSE, CAPILLARY
Glucose-Capillary: 88 mg/dL (ref 70–99)
Glucose-Capillary: 271 mg/dL — ABNORMAL HIGH (ref 70–99)
Glucose-Capillary: 78 mg/dL (ref 70–99)

## 2011-10-17 MED ORDER — INSULIN DETEMIR 100 UNIT/ML ~~LOC~~ SOLN
7.0000 [IU] | Freq: Every day | SUBCUTANEOUS | Status: DC
  Administered 2011-10-17: 7 [IU] via SUBCUTANEOUS

## 2011-10-17 MED ORDER — IOHEXOL 350 MG/ML SOLN
50.0000 mL | Freq: Once | INTRAVENOUS | Status: AC | PRN
  Administered 2011-10-17: 50 mL via INTRAVENOUS

## 2011-10-17 MED ORDER — INSULIN DETEMIR 100 UNIT/ML ~~LOC~~ SOLN
5.0000 [IU] | Freq: Every day | SUBCUTANEOUS | Status: DC
  Administered 2011-10-17 – 2011-10-18 (×2): 5 [IU] via SUBCUTANEOUS

## 2011-10-17 MED ORDER — POTASSIUM CHLORIDE CRYS ER 20 MEQ PO TBCR
40.0000 meq | EXTENDED_RELEASE_TABLET | ORAL | Status: AC
  Administered 2011-10-17 (×2): 40 meq via ORAL

## 2011-10-17 NOTE — Progress Notes (Signed)
Stroke Team Progress Note  HISTORY Carmen [name] is an 78 y.o. female admitted to [facility] 10/15/11 due to uncontrolled HTN (223/88) and marked hyperglycemia (530); patient had been out of her medicines for ~1 week. Patient has known suboptimally controlled HTN (SBP 180/s) and DM2. Due to c/o AMS and increased fatigue, head CT was ordered. This revealed an infarct in the right pons, age indeterminate. Patient cannot have MRI due to PTVPM. Neurology consult requested.      SUBJECTIVE The daughter is at bedside. The patient is alert and cooperative. The patient has indicated that she ran out of her blood pressure medications 3 days prior to admission. The patient indicates no new focal neurologic symptoms of numbness, weakness, headache, visual disturbances, speech changes, swallowing problems, or balance issues. The patient indicates that she had a stroke 12 years ago that left her with some weakness of the right arm.    OBJECTIVE Most recent Vital Signs: Temp: 97.4 F (36.3 C) (08/11 0856) Temp src: Oral (08/11 0856) BP: 145/78 mmHg (08/11 0856) Pulse Rate: 78  (08/11 0856) Respiratory Rate: 18 O2 Saturation: 95%  CBG (last 3)  Basename 10/17/11 0729 10/16/11 2114 10/16/11 1718  GLUCAP 88 165* 381*   Intake/Output from previous day:    IV Fluid Intake:    Medications    . aspirin  81 mg Oral Daily  . atorvastatin  20 mg Oral q1800  . cilostazol  100 mg Oral BID  . enoxaparin (LOVENOX) injection  40 mg Subcutaneous Q24H  . feeding supplement  237 mL Oral BID BM  . hydrALAZINE  50 mg Oral QID  . insulin aspart  0-15 Units Subcutaneous TID WC  . insulin aspart  0-5 Units Subcutaneous QHS  . insulin aspart  3 Units Subcutaneous TID WC  . insulin detemir  5 Units Subcutaneous Daily  . insulin detemir  7 Units Subcutaneous QHS  . isosorbide mononitrate  60 mg Oral Daily  . losartan  50 mg Oral Daily  . magnesium chloride  1 tablet Oral Daily  . metoprolol tartrate  25 mg Oral BID    . potassium chloride  10 mEq Intravenous Q1 Hr x 2  . potassium chloride  40 mEq Oral BID  . potassium chloride  40 mEq Oral Once  . sodium chloride  3 mL Intravenous Q12H  . sodium chloride  3 mL Intravenous Q12H  . DISCONTD: insulin detemir  5-7 Units Subcutaneous BID  . DISCONTD: losartan  50 mg Oral Daily  PRN sodium chloride, ALPRAZolam, HYDROcodone-acetaminophen, sodium chloride  Diet:  Carb Control thin liquids Activity:  Up with assistance DVT Prophylaxis:  Lovenox  Significant Diagnostic Studies: CBC    Component Value Date/Time   WBC 4.8 10/15/2011 2102   RBC 4.40 10/15/2011 2102   HGB 14.3 10/15/2011 2102   HCT 40.8 10/15/2011 2102   PLT 111* 10/15/2011 2102   MCV 92.7 10/15/2011 2102   MCH 32.5 10/15/2011 2102   MCHC 35.0 10/15/2011 2102   RDW 14.9 10/15/2011 2102   LYMPHSABS 1.6 10/15/2011 1307   MONOABS 0.2 10/15/2011 1307   EOSABS 0.1 10/15/2011 1307   BASOSABS 0.0 10/15/2011 1307   CMP    Component Value Date/Time   NA 136 10/17/2011 0531   K 3.0* 10/17/2011 0531   CL 102 10/17/2011 0531   CO2 28 10/17/2011 0531   GLUCOSE 118* 10/17/2011 0531   BUN 13 10/17/2011 0531   CREATININE 0.81 10/17/2011 0531   CALCIUM 8.1* 10/17/2011 0531  PROT 4.5* 07/30/2011 0300   ALBUMIN 2.4* 07/30/2011 0300   AST 34 07/30/2011 0300   ALT 34 07/30/2011 0300   ALKPHOS 50 07/30/2011 0300   BILITOT 0.3 07/30/2011 0300   GFRNONAA 69* 10/17/2011 0531   GFRAA 80* 10/17/2011 0531   COAGS Lab Results  Component Value Date   INR 1.08 07/30/2011   INR 1.02 07/29/2011   INR 0.94 03/28/2011   Lipid Panel    Component Value Date/Time   CHOL 102 10/17/2011 0531   TRIG 61 10/17/2011 0531   HDL 62 10/17/2011 0531   CHOLHDL 1.6 10/17/2011 0531   VLDL 12 10/17/2011 0531   LDLCALC 28 10/17/2011 0531   HgbA1C  Lab Results  Component Value Date   HGBA1C 12.2* 10/15/2011   Urine Drug Screen    Component Value Date/Time   LABOPIA NONE DETECTED 04/11/2009 1112   COCAINSCRNUR NONE DETECTED 04/11/2009 1112   LABBENZ NONE  DETECTED 04/11/2009 1112   AMPHETMU NONE DETECTED 04/11/2009 1112   THCU NONE DETECTED 04/11/2009 1112   LABBARB NONE DETECTED 04/11/2009 1112    Alcohol Level    Component Value Date/Time   ETH  Value: <5        LOWEST DETECTABLE LIMIT FOR SERUM ALCOHOL IS 5 mg/dL FOR MEDICAL PURPOSES ONLY 04/11/2009 0852     Results for orders placed during the hospital encounter of 10/15/11 (from the past 24 hour(s))  GLUCOSE, CAPILLARY     Status: Abnormal   Collection Time   10/16/11 11:37 AM      Component Value Range   Glucose-Capillary 149 (*) 70 - 99 mg/dL  POTASSIUM     Status: Abnormal   Collection Time   10/16/11  2:45 PM      Component Value Range   Potassium 3.4 (*) 3.5 - 5.1 mEq/L  GLUCOSE, CAPILLARY     Status: Abnormal   Collection Time   10/16/11  5:18 PM      Component Value Range   Glucose-Capillary 381 (*) 70 - 99 mg/dL  GLUCOSE, CAPILLARY     Status: Abnormal   Collection Time   10/16/11  9:14 PM      Component Value Range   Glucose-Capillary 165 (*) 70 - 99 mg/dL  LIPID PANEL     Status: Normal   Collection Time   10/17/11  5:31 AM      Component Value Range   Cholesterol 102  0 - 200 mg/dL   Triglycerides 61  <161 mg/dL   HDL 62  >09 mg/dL   Total CHOL/HDL Ratio 1.6     VLDL 12  0 - 40 mg/dL   LDL Cholesterol 28  0 - 99 mg/dL  BASIC METABOLIC PANEL     Status: Abnormal   Collection Time   10/17/11  5:31 AM      Component Value Range   Sodium 136  135 - 145 mEq/L   Potassium 3.0 (*) 3.5 - 5.1 mEq/L   Chloride 102  96 - 112 mEq/L   CO2 28  19 - 32 mEq/L   Glucose, Bld 118 (*) 70 - 99 mg/dL   BUN 13  6 - 23 mg/dL   Creatinine, Ser 6.04  0.50 - 1.10 mg/dL   Calcium 8.1 (*) 8.4 - 10.5 mg/dL   GFR calc non Af Amer 69 (*) >90 mL/min   GFR calc Af Amer 80 (*) >90 mL/min  GLUCOSE, CAPILLARY     Status: Normal   Collection Time  10/17/11  7:29 AM      Component Value Range   Glucose-Capillary 88  70 - 99 mg/dL    Dg Chest 2 View  03/13/1094  *RADIOLOGY REPORT*  Clinical  Data: Weakness  CHEST - 2 VIEW  Comparison: 07/20/2011  Findings: Increased interstitial markings.  No frank interstitial edema.  No pleural effusion or pneumothorax.  The heart is top normal in size.  Left subclavian pacemaker.  Degenerative changes of the visualized thoracolumbar spine.  IMPRESSION: No evidence of acute cardiopulmonary disease.  Original Report Authenticated By: Charline Bills, M.D.   Ct Head Wo Contrast  10/15/2011  *RADIOLOGY REPORT*  Clinical Data: Altered mental status  CT HEAD WITHOUT CONTRAST  Technique:  Contiguous axial images were obtained from the base of the skull through the vertex without contrast.  Comparison: 03/28/2011  Findings: Chronic ischemic changes in the basal ganglia and periventricular white matter.  White matter lesion involving the corpus callosum is suspected and is not significantly changed. Ischemic changes in the right pons is a new finding.  Age is indeterminate.  No mass effect, midline shift, or acute intracranial hemorrhage mastoid air cells are clear.  Cranium is intact.  IMPRESSION: New infarct in the right pons.  Age is indeterminate.  MRI may be helpful to further characterize.  Chronic ischemic changes.  Original Report Authenticated By: Donavan Burnet, M.D.    CT of the brain   IMPRESSION:  New infarct in the right pons. Age is indeterminate. MRI may be  helpful to further characterize.   CT angio  pending  MRI of the brain  pacer  MRA of the brain  pacer  2D Echocardiogram    Done January 2013: Study Conclusions  - Left ventricle: The cavity size was normal. There was moderate concentric hypertrophy. Systolic function was normal. The estimated ejection fraction was in the range of 60% to 65%. Wall motion was normal; there were no regional wall motion abnormalities. - Left atrium: The atrium was mildly dilated. - Pulmonary arteries: Systolic pressure was mildly increased. PA peak pressure: 40mm Hg (S).    Carotid Doppler   canceled  CXR   IMPRESSION:  No evidence of acute cardiopulmonary disease.   EKG  not ordered  Physical exam  The patient is alert and cooperative at the time of the examination.  The patient has full extraocular movements, visual fields are full, smile is symmetric.  The patient has some weakness with grip of the right upper extremity, and with elevation of the right arm. The patient otherwise has good strength of the left arm, and both legs.  The patient is able to perform finger-nose-finger and heel-to-shin bilaterally. Gait was not tested.  The patient has symmetric but depressed reflexes.  Speech is normal, without dysarthria or aphasia.     ASSESSMENT Carmen [name] is a 78 y.o. female with a pontine stroke, secondary to small vessel disease. On aspirin 81 mg orally every day for secondary stroke prevention.  Stroke risk factors:  diabetes mellitus, hyperlipidemia and hypertension  Hospital day # 2  TREATMENT/PLAN Continue aspirin 81 mg orally every day for secondary stroke prevention.   This patient reports no new focal neurologic deficits. CT scan of the brain shows a small brain stem infarct that could be chronic in nature. The patient will be subjected to a CT angiogram of the head and neck. The carotid Doppler study will be stopped. Patient likely has not suffered a recent stroke event.  -CT angiogram  head -CT angiogram neck -Discontinue carotid Doppler -Stroke team will follow -Continue aspirin therapy   Lesly Dukes

## 2011-10-17 NOTE — Progress Notes (Signed)
[facility] PROGRESS NOTE  Carmen [name] ZOX:[id number] DOB: [birth year] DOA: 10/15/2011 PCP: Gwynneth Aliment, MD  Assessment/Plan: Active Problems:  HTN (hypertension), malignant  Diabetic hyperosmolar non-ketotic state  CVA (cerebral infarction)  1. Hypertension: Patient's blood pressures are much better controlled this morning without any PRN medications. Will continue to monitor. 2. Diabetes type 2: Patient's blood sugars have been adequately but not optimally controlled.  3. CVA versus possible CVA: The patient was seen by neurology today. They're not convinced that this patient actually had a CVA despite the findings on CT scan. CT angiogram of head and neck pending at present. 4. Hypokalemia: Potassium still marginal. Replete orally. 5. Disposition: Likely D/C home tomorrow.  Code Status: Full code Family Communication:Daughter updated. Disposition Plan: Anticipate discharge to home at the time of discharge  Rosalyn Archambault A.  [facility] Pager [phone]. If 8PM-8AM, please contact night-coverage at [email or web address], password [facility] 10/17/2011, 6:27 PM  LOS: 2 days   Brief narrative: This is a 78 year old female with history of hypertension and diabetes who sent her to the ER by her primary care physician's office when she was noted to have a markedly elevated blood pressure. The patient states that she was out of her Levemir for diabetes and went to the physician's office to see she could obtain a sample. She states that while at physician's office her blood pressure was checked and was found to be markedly elevated. The direct her to come to the emergency room for further evaluation and management. The patient herself denies any complaints however her daughter states that she's noted in the last 2 days the patient has been more sleepy than usual although she is arousable and has not had any loss of consciousness or disorientation. Patient denies any chest pain, dizziness,  fever, chills, nausea, vomiting or diarrhea.   Consultants:  Neurology  Procedures:  None  Antibiotics:  None  HPI/Subjective: Patient states that she feels much better today.  Objective: Filed Vitals:   10/17/11 0504 10/17/11 0856 10/17/11 1253 10/17/11 1627  BP: 129/65 145/78 163/64 141/63  Pulse: 65 78 64 70  Temp: 98.3 F (36.8 C) 97.4 F (36.3 C) 97.7 F (36.5 C) 98.6 F (37 C)  TempSrc: Oral Oral Oral Oral  Resp: 18 18 18 18   Height:      Weight:      SpO2: 93% 95% 96% 97%   Weight change: 4.093 kg (9 lb 0.4 oz)  Intake/Output Summary (Last 24 hours) at 10/17/11 1827 Last data filed at 10/17/11 1700  Gross per 24 hour  Intake    720 ml  Output      0 ml  Net    720 ml    General: Alert, awake, oriented x3, in no acute distress.  HEENT: [place]/AT PEERL, EOMI Heart: Regular rate and rhythm, without murmurs, rubs, gallops, PMI non-displaced, no heaves or thrills on palpation.  Lungs: Clear to auscultation, no wheezing or rhonchi noted. No increased vocal fremitus resonant to percussion  Abdomen: Soft, nontender, nondistended, positive bowel sounds, no masses no hepatosplenomegaly noted..  Neuro: No focal neurological deficits noted cranial nerves II through XII grossly intact. DTRs 2+ bilaterally upper and lower extremities. Strength functional and equal in bilateral upper and lower extremities   Data Reviewed: Basic Metabolic Panel:  Lab 10/17/11 8295 10/16/11 1445 10/16/11 0621 10/15/11 2102 10/15/11 1307  NA 136 -- 145 -- 139  K 3.0* 3.4* 2.5* -- 3.4*  CL 102 -- 102 -- 98  CO2 28 --  37* -- 30  GLUCOSE 118* -- 38* -- 598*  BUN 13 -- 11 -- 11  CREATININE 0.81 -- 0.81 0.91 1.06  CALCIUM 8.1* -- 8.7 -- 9.6  MG -- -- -- -- --  PHOS -- -- -- -- --   Liver Function Tests: No results found for this basename: AST:5,ALT:5,ALKPHOS:5,BILITOT:5,PROT:5,ALBUMIN:5 in the last 168 hours No results found for this basename: LIPASE:5,AMYLASE:5 in the last 168  hours No results found for this basename: AMMONIA:5 in the last 168 hours CBC:  Lab 10/15/11 2102 10/15/11 1307  WBC 4.8 4.8  NEUTROABS -- 2.9  HGB 14.3 15.1*  HCT 40.8 44.0  MCV 92.7 93.0  PLT 111* 120*   Cardiac Enzymes:  Lab 10/15/11 1456  CKTOTAL --  CKMB --  CKMBINDEX --  TROPONINI <0.30   BNP (last 3 results) No results found for this basename: PROBNP:3 in the last 8760 hours CBG:  Lab 10/17/11 1631 10/17/11 1143 10/17/11 0729 10/16/11 2114 10/16/11 1718  GLUCAP 98 271* 88 165* 381*    Recent Results (from the past 240 hour(s))  URINE CULTURE     Status: Normal   Collection Time   10/15/11  4:10 PM      Component Value Range Status Comment   Specimen Description URINE, CLEAN CATCH   Final    Special Requests NONE   Final    Culture  Setup Time 10/16/2011 01:58   Final    Colony Count >=100,000 COLONIES/ML   Final    Culture     Final    Value: Multiple bacterial morphotypes present, none predominant. Suggest appropriate recollection if clinically indicated.   Report Status 10/17/2011 FINAL   Final      Studies: Dg Chest 2 View  10/15/2011  *RADIOLOGY REPORT*  Clinical Data: Weakness  CHEST - 2 VIEW  Comparison: 07/20/2011  Findings: Increased interstitial markings.  No frank interstitial edema.  No pleural effusion or pneumothorax.  The heart is top normal in size.  Left subclavian pacemaker.  Degenerative changes of the visualized thoracolumbar spine.  IMPRESSION: No evidence of acute cardiopulmonary disease.  Original Report Authenticated By: Charline Bills, M.D.   Ct Head Wo Contrast  10/15/2011  *RADIOLOGY REPORT*  Clinical Data: Altered mental status  CT HEAD WITHOUT CONTRAST  Technique:  Contiguous axial images were obtained from the base of the skull through the vertex without contrast.  Comparison: 03/28/2011  Findings: Chronic ischemic changes in the basal ganglia and periventricular white matter.  White matter lesion involving the corpus callosum is  suspected and is not significantly changed. Ischemic changes in the right pons is a new finding.  Age is indeterminate.  No mass effect, midline shift, or acute intracranial hemorrhage mastoid air cells are clear.  Cranium is intact.  IMPRESSION: New infarct in the right pons.  Age is indeterminate.  MRI may be helpful to further characterize.  Chronic ischemic changes.  Original Report Authenticated By: Donavan Burnet, M.D.    Scheduled Meds:    . aspirin  81 mg Oral Daily  . atorvastatin  20 mg Oral q1800  . cilostazol  100 mg Oral BID  . enoxaparin (LOVENOX) injection  40 mg Subcutaneous Q24H  . feeding supplement  237 mL Oral BID BM  . hydrALAZINE  50 mg Oral QID  . insulin aspart  0-15 Units Subcutaneous TID WC  . insulin aspart  0-5 Units Subcutaneous QHS  . insulin aspart  3 Units Subcutaneous TID WC  . insulin detemir  5 Units Subcutaneous Daily  . insulin detemir  7 Units Subcutaneous QHS  . isosorbide mononitrate  60 mg Oral Daily  . losartan  50 mg Oral Daily  . magnesium chloride  1 tablet Oral Daily  . metoprolol tartrate  25 mg Oral BID  . potassium chloride  40 mEq Oral BID  . potassium chloride  40 mEq Oral Once  . sodium chloride  3 mL Intravenous Q12H  . sodium chloride  3 mL Intravenous Q12H  . DISCONTD: insulin detemir  5-7 Units Subcutaneous BID  . DISCONTD: losartan  50 mg Oral Daily   Continuous Infusions:   Active Problems:  HTN (hypertension), malignant  Diabetic hyperosmolar non-ketotic state  CVA (cerebral infarction)

## 2011-10-18 DIAGNOSIS — E162 Hypoglycemia, unspecified: Secondary | ICD-10-CM

## 2011-10-18 LAB — BASIC METABOLIC PANEL
BUN: 11 mg/dL (ref 6–23)
Calcium: 8.6 mg/dL (ref 8.4–10.5)
GFR calc Af Amer: 80 mL/min — ABNORMAL LOW (ref >90)
GFR calc non Af Amer: 69 mL/min — ABNORMAL LOW (ref >90)
Glucose, Bld: 59 mg/dL — ABNORMAL LOW (ref 70–99)
Sodium: 137 mEq/L (ref 135–145)

## 2011-10-18 LAB — GLUCOSE, CAPILLARY
Glucose-Capillary: 48 mg/dL — ABNORMAL LOW (ref 70–99)
Glucose-Capillary: 88 mg/dL (ref 70–99)

## 2011-10-18 MED ORDER — ASPIRIN 81 MG PO CHEW: 81.0000 mg | CHEWABLE_TABLET | Freq: Every day | ORAL | Status: DC

## 2011-10-18 MED ORDER — INSULIN DETEMIR 100 UNIT/ML ~~LOC~~ SOLN: 5.0000 [IU] | Freq: Two times a day (BID) | SUBCUTANEOUS | Status: DC

## 2011-10-18 MED ORDER — GLUCERNA SHAKE PO LIQD: 237.0000 mL | Freq: Two times a day (BID) | ORAL | Status: DC

## 2011-10-18 NOTE — Discharge Summary (Signed)
Carmen [name] MRN: [id number] DOB/AGE: 1933-08-05 78 y.o.  Admit date: 10/15/2011 Discharge date: 10/18/2011  Primary Care Physician:  Gwynneth Aliment, MD   Discharge Diagnoses:   Patient Active Problem List  Diagnosis  . HTN (hypertension), malignant  . Hypokalemia  . Tobacco abuse  . DM (diabetes mellitus), type 2  . Peripheral neuropathy  . Claudication in peripheral vascular disease status post stent to common iliac artery and 2011 and right subclavian artery stent 2010  . Retrovascular hypertension status post left renal artery stent 2009 and balloon angioplasty for in-stent restenosis 2011  . Weakness generalized  . Hypomagnesemia  . Hypoglycemia  . Accelerated hypertension  . Second-degree heart block  . Lower GI bleed  . Acute blood loss anemia  . Diverticulosis of colon with hemorrhage  . History of CVA (cerebrovascular accident) with associated mild right upper extremity hemiplegia  . Cardiac pacemaker for history of intermittent heart block  . Corporate investment banker  . Diabetic hyperosmolar non-ketotic state  . CVA (cerebral infarction)    DISCHARGE MEDICATION: Medication List  As of 10/18/2011  2:55 PM   TAKE these medications         ALPRAZolam 0.5 MG tablet   Commonly known as: XANAX   Take 0.5 mg by mouth daily as needed. For anxiety      aspirin 81 MG chewable tablet   Chew 1 tablet (81 mg total) by mouth daily.      cilostazol 100 MG tablet   Commonly known as: PLETAL   Take 100 mg by mouth 2 (two) times daily.      feeding supplement Liqd   Take 237 mLs by mouth 2 (two) times daily between meals.      furosemide 40 MG tablet   Commonly known as: LASIX   Take 40 mg by mouth daily.      hydrALAZINE 50 MG tablet   Commonly known as: APRESOLINE   Take 50 mg by mouth 4 (four) times daily.      HYDROcodone-acetaminophen 5-500 MG per tablet   Commonly known as: VICODIN   Take 1 tablet by mouth every 4 (four) hours as needed. For pain     insulin detemir 100 UNIT/ML injection   Commonly known as: LEVEMIR   Inject 5-7 Units into the skin 2 (two) times daily. 5 units every morning and 5 units at bedtime      isosorbide mononitrate 60 MG 24 hr tablet   Commonly known as: IMDUR   Take 60 mg by mouth daily.      losartan 100 MG tablet   Commonly known as: COZAAR   Take 50 mg by mouth daily.      magnesium chloride 64 MG Tbec   Commonly known as: SLOW-MAG   Take 1 tablet by mouth daily.      metoprolol tartrate 25 MG tablet   Commonly known as: LOPRESSOR   Take 25 mg by mouth 2 (two) times daily.      potassium chloride SA 20 MEQ tablet   Commonly known as: K-DUR,KLOR-CON   Take 20 mEq by mouth 2 (two) times daily.      rosuvastatin 10 MG tablet   Commonly known as: CRESTOR   Take 10 mg by mouth daily.              Consults:     SIGNIFICANT DIAGNOSTIC STUDIES:  Ct Angio Head W/cm &/or Wo Cm  10/17/2011  *RADIOLOGY REPORT*  Clinical Data:  Diabetic hypertensive patient  with renal disorder and coronary artery disease.  Altered mental status.  No new neurological findings otherwise noted. Pacemaker and cannot have MR.  Left carotid endarterectomy 1991.  CT ANGIOGRAPHY HEAD AND NECK  Technique:  Multidetector CT imaging of the head and neck was performed using the standard protocol during bolus administration of intravenous contrast.  Multiplanar CT image reconstructions including MIPs were obtained to evaluate the vascular anatomy. Carotid stenosis measurements (when applicable) are obtained utilizing NASCET criteria, using the distal internal carotid diameter as the denominator.  Contrast: 50mL OMNIPAQUE IOHEXOL 350 MG/ML SOLN  Comparison:  10/15/2011 head CT.  CTA NECK  Findings:  Pacemaker in place.  Atherosclerotic type changes aortic arch with ectasia.  Normal configuration of the origin of the great vessels from the aortic arch.  Mild narrowing proximal innominate artery.  Mild to moderate narrowing 2 cm above the  origin of the innominate artery.  Stent within the proximal right subclavian artery with moderate narrowing.  Mild narrowing proximal right common carotid artery.  Plaque proximal left internal carotid artery with narrowing most notable 1.8 cm above the origin of the left internal carotid artery where there is a shelf-like plaque with focal 62% diameter stenosis.  Ectatic vertical cervical segment of the right internal carotid artery with fold at the C1 level with narrowing.  Calcification with mild narrowing proximal left common carotid artery.  Fold 4 cm beyond its origin with mild narrowing.  Prior left carotid endarterectomy.  Calcification along the endarterectomy site with mild narrowing but without hemodynamically significant stenosis.  Ectatic vertical segment left internal carotid artery.  Moderate to slightly marked narrowing proximal right vertebral artery.  Mild irregularity left subclavian artery without significant narrowing.  The left vertebral artery is dominant.  Fold of the left vertebral artery 2 cm above its origin with plaque and mild narrowing.  Degenerative changes cervical spine most notable C5-6 and less so C4-5.  Mild spinal stenosis with minimal cord contact at both of these levels.  Small nodules in the thyroid gland largest left lobe measuring 6 mm.  Nonspecific 1 cm low density structure extends from the base of the right palatine tonsil to the epiglottis.   Review of the MIP images confirms the above findings.  IMPRESSION: Stent within the proximal right subclavian artery with moderate narrowing.  Plaque proximal left internal carotid artery with narrowing most notable 1.8 cm above the origin of the left internal carotid artery where there is a shelf-like plaque with focal 62% diameter stenosis.  Prior left carotid endarterectomy.  Calcification along the endarterectomy site with mild narrowing but without hemodynamically significant stenosis.  Moderate to slightly marked narrowing  proximal right vertebral artery.  Please see above for additional findings.  CTA HEAD  Findings:  Since the recent CT examination, there is loss of sulci in the superior right convexity (series 2 images 24 through 26) with mild enhancement suggesting acute to posterior right frontal lobe infarct.  As the patient has a arteriovenous malformation of the anterior left opercular region, one could raise possibility of subarachnoid hemorrhage causing loss of sulci in the right frontal lobe however, there is no subarachnoid hemorrhage surrounding the left frontal opercular arteriovenous malformation and subarachnoid hemorrhage is felt to be a much less likely consideration for the right frontal lobe findings.  Encephalitis also felt unlikely (to be considered if of clinical concern). Tumor felt unlikely.  Prominent tangle of vessels anterior left opercular region consistent with arteriovenous malformation. No adjacent subarachnoid hemorrhage.  Right  pontine small infarct appears remote.  Remote right thalamic infarct.  Small vessel disease type changes.  Global atrophy without hydrocephalus.  Atherosclerotic type changes cavernous segment of the internal carotid artery bilaterally with mild to slightly moderate narrowing.  Mild irregularity and narrowing of the M1 segment of the right middle cerebral artery with mild to slightly moderate narrowing right middle cerebral artery bifurcation.  After the takeoff of the right PICA, small right vertebral artery.  Mild narrowing distal left vertebral artery after the takeoff of the left PICA.  Slight irregularity of small caliber basilar artery without high- grade stenosis.   Review of the MIP images confirms the above findings.  IMPRESSION: Acute right frontal lobe infarct suspected as detailed above.  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  Remote infarcts, small vessel disease type changes and intracranial atherosclerotic type changes as  detailed above.  Critical Value/emergent results were called by telephone at the time of interpretation on 10/17/2011 at 6:06 p.m. to Dr. Terrace Arabia, who verbally acknowledged these results.  Original Report Authenticated By: Fuller Canada, M.D.   Dg Chest 2 View  10/15/2011  *RADIOLOGY REPORT*  Clinical Data: Weakness  CHEST - 2 VIEW  Comparison: 07/20/2011  Findings: Increased interstitial markings.  No frank interstitial edema.  No pleural effusion or pneumothorax.  The heart is top normal in size.  Left subclavian pacemaker.  Degenerative changes of the visualized thoracolumbar spine.  IMPRESSION: No evidence of acute cardiopulmonary disease.  Original Report Authenticated By: Charline Bills, M.D.   Ct Head Wo Contrast  10/15/2011  *RADIOLOGY REPORT*  Clinical Data: Altered mental status  CT HEAD WITHOUT CONTRAST  Technique:  Contiguous axial images were obtained from the base of the skull through the vertex without contrast.  Comparison: 03/28/2011  Findings: Chronic ischemic changes in the basal ganglia and periventricular white matter.  White matter lesion involving the corpus callosum is suspected and is not significantly changed. Ischemic changes in the right pons is a new finding.  Age is indeterminate.  No mass effect, midline shift, or acute intracranial hemorrhage mastoid air cells are clear.  Cranium is intact.  IMPRESSION: New infarct in the right pons.  Age is indeterminate.  MRI may be helpful to further characterize.  Chronic ischemic changes.  Original Report Authenticated By: Donavan Burnet, M.D.   Ct Angio Neck W/cm &/or Wo/cm  10/17/2011  *RADIOLOGY REPORT*  Clinical Data:  Diabetic hypertensive patient with renal disorder and coronary artery disease.  Altered mental status.  No new neurological findings otherwise noted. Pacemaker and cannot have MR.  Left carotid endarterectomy 1991.  CT ANGIOGRAPHY HEAD AND NECK  Technique:  Multidetector CT imaging of the head and neck was performed  using the standard protocol during bolus administration of intravenous contrast.  Multiplanar CT image reconstructions including MIPs were obtained to evaluate the vascular anatomy. Carotid stenosis measurements (when applicable) are obtained utilizing NASCET criteria, using the distal internal carotid diameter as the denominator.  Contrast: 50mL OMNIPAQUE IOHEXOL 350 MG/ML SOLN  Comparison:  10/15/2011 head CT.  CTA NECK  Findings:  Pacemaker in place.  Atherosclerotic type changes aortic arch with ectasia.  Normal configuration of the origin of the great vessels from the aortic arch.  Mild narrowing proximal innominate artery.  Mild to moderate narrowing 2 cm above the origin of the innominate artery.  Stent within the proximal right subclavian artery with moderate narrowing.  Mild narrowing proximal right common carotid artery.  Plaque proximal left  internal carotid artery with narrowing most notable 1.8 cm above the origin of the left internal carotid artery where there is a shelf-like plaque with focal 62% diameter stenosis.  Ectatic vertical cervical segment of the right internal carotid artery with fold at the C1 level with narrowing.  Calcification with mild narrowing proximal left common carotid artery.  Fold 4 cm beyond its origin with mild narrowing.  Prior left carotid endarterectomy.  Calcification along the endarterectomy site with mild narrowing but without hemodynamically significant stenosis.  Ectatic vertical segment left internal carotid artery.  Moderate to slightly marked narrowing proximal right vertebral artery.  Mild irregularity left subclavian artery without significant narrowing.  The left vertebral artery is dominant.  Fold of the left vertebral artery 2 cm above its origin with plaque and mild narrowing.  Degenerative changes cervical spine most notable C5-6 and less so C4-5.  Mild spinal stenosis with minimal cord contact at both of these levels.  Small nodules in the thyroid gland  largest left lobe measuring 6 mm.  Nonspecific 1 cm low density structure extends from the base of the right palatine tonsil to the epiglottis.   Review of the MIP images confirms the above findings.  IMPRESSION: Stent within the proximal right subclavian artery with moderate narrowing.  Plaque proximal left internal carotid artery with narrowing most notable 1.8 cm above the origin of the left internal carotid artery where there is a shelf-like plaque with focal 62% diameter stenosis.  Prior left carotid endarterectomy.  Calcification along the endarterectomy site with mild narrowing but without hemodynamically significant stenosis.  Moderate to slightly marked narrowing proximal right vertebral artery.  Please see above for additional findings.  CTA HEAD  Findings:  Since the recent CT examination, there is loss of sulci in the superior right convexity (series 2 images 24 through 26) with mild enhancement suggesting acute to posterior right frontal lobe infarct.  As the patient has a arteriovenous malformation of the anterior left opercular region, one could raise possibility of subarachnoid hemorrhage causing loss of sulci in the right frontal lobe however, there is no subarachnoid hemorrhage surrounding the left frontal opercular arteriovenous malformation and subarachnoid hemorrhage is felt to be a much less likely consideration for the right frontal lobe findings.  Encephalitis also felt unlikely (to be considered if of clinical concern). Tumor felt unlikely.  Prominent tangle of vessels anterior left opercular region consistent with arteriovenous malformation. No adjacent subarachnoid hemorrhage.  Right pontine small infarct appears remote.  Remote right thalamic infarct.  Small vessel disease type changes.  Global atrophy without hydrocephalus.  Atherosclerotic type changes cavernous segment of the internal carotid artery bilaterally with mild to slightly moderate narrowing.  Mild irregularity and narrowing  of the M1 segment of the right middle cerebral artery with mild to slightly moderate narrowing right middle cerebral artery bifurcation.  After the takeoff of the right PICA, small right vertebral artery.  Mild narrowing distal left vertebral artery after the takeoff of the left PICA.  Slight irregularity of small caliber basilar artery without high- grade stenosis.   Review of the MIP images confirms the above findings.  IMPRESSION: Acute right frontal lobe infarct suspected as detailed above.  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  Remote infarcts, small vessel disease type changes and intracranial atherosclerotic type changes as detailed above.  Critical Value/emergent results were called by telephone at the time of interpretation on 10/17/2011 at 6:06 p.m. to Dr. Terrace Arabia, who verbally acknowledged these results.  Original Report Authenticated By: Fuller Canada, M.D.   Review of ECHO (03/29/2011): Left ventricle: The cavity size was normal. There was moderate concentric hypertrophy. Systolic function was normal. The estimated ejection fraction was in the range of 60% to 65%. Wall motion was normal; there were no regional wall motion abnormalities. - Left atrium: The atrium was mildly dilated. - Pulmonary arteries: Systolic pressure was mildly increased. PA peak pressure     Recent Results (from the past 240 hour(s))  URINE CULTURE     Status: Normal   Collection Time   10/15/11  4:10 PM      Component Value Range Status Comment   Specimen Description URINE, CLEAN CATCH   Final    Special Requests NONE   Final    Culture  Setup Time 10/16/2011 01:58   Final    Colony Count >=100,000 COLONIES/ML   Final    Culture     Final    Value: Multiple bacterial morphotypes present, none predominant. Suggest appropriate recollection if clinically indicated.   Report Status 10/17/2011 FINAL   Final     BRIEF ADMITTING H & P: This is a 78 year old female with  history of hypertension and diabetes who sent her to the ER by her primary care physician's office when she was noted to have a markedly elevated blood pressure. The patient states that she was out of her Levemir for diabetes and went to the physician's office to see she could obtain a sample. She states that while at physician's office her blood pressure was checked and was found to be markedly elevated. The direct her to come to the emergency room for further evaluation and management. The patient herself denies any complaints however her daughter states that she's noted in the last 2 days the patient has been more sleepy than usual although she is arousable and has not had any loss of consciousness or disorientation. Patient denies any chest pain, dizziness, fever, chills, nausea, vomiting or diarrhea.  In the emergency room the patient was found to have a markedly elevated blood sugar at 530. She was also found to have an elevated blood pressure at 223/88. Please note that the patient states that her blood pressure usually resides around 185 systolic. A CT scan of her head also revealed a new infarct in the right pons age-indeterminate    Hospital Course:  Present on Admission:  .CVA (cerebral infarction): CT scan of the brain showed an CVA that may be subacute. The patient did not exhibit any focal weaknesses. She was seen by the neurologist who felt that this may or may not have an acute CVA. She had a CT angiogram of the head and neck head was reviewed by Dr. Marjory Lies who recommended that patient continue on aspirin 81 mg and followup with Dr. Pearlean Brownie in the clinic in 2 months. Patient is evaluated by physical therapy who recommended home health physical therapy.  Marland KitchenHTN (hypertension), malignant:The patient has malignant hypertension however this was only approximately 20-25 points greater than her usual baseline. However it was concerning that the patient has a possible CVA in the setting of an  elevated blood pressure.the patient was resumed on her usual blood pressure medications and her blood pressure returned to baseline without any additional antihypertensive intervention. At the time of discharge the patient had a blood pressure of 130/51.  .Diabetic hyperosmolar non-ketotic state: The patient presents nonketotic hyperosmolar state. She received IV insulin in the emergency room as well IV fluids. She was  then transitioned to her Levemir and NovoLog she had been on the left and there 5 mg in the morning 7 mg in the evening. However her overnight and morning blood sugars were low. The Levemir has been changed to 5 mg subcutaneously twice a day    Disposition and Follow-up:  Patient followup with her primary care physician Dr. Allyne Gee in 3 days and with Dr. Pearlean Brownie neurology in 2 months. Discharge Orders    Future Appointments: Provider: Department: Dept Phone: Center:   10/21/2011 10:15 AM Duke Salvia, MD [facility] [phone] [id number]     Future Orders Please Complete By Expires   Diet Carb Modified      Diet - low sodium heart healthy      Home Health      Questions: Responses:   To provide the following care/treatments PT   Face-to-face encounter      Comments:   I MATTHEWS,MICHELLE A. certify that this patient is under my care and that I, or a nurse practitioner or physician's assistant working with me, had a face-to-face encounter that meets the physician face-to-face encounter requirements with this patient on 10/18/2011.   Questions: Responses:   The encounter with the patient was in whole, or in part, for the following medical condition, which is the primary reason for home health care CVA   I certify that, based on my findings, the following services are medically necessary home health services Physical therapy   My clinical findings support the need for the above services OTHER SEE COMMENTS   Further, I certify that my clinical findings support that  this patient is homebound due to: Unable to leave home safely without assistance   To provide the following care/treatments PT   Walk with assistance      Walker          DISCHARGE EXAM:  General: Alert, awake, oriented x3, in no acute distress. Patient states that she feels well enough to run home. Vital signs:Blood pressure 138/51, pulse 70, temperature 98.6 F (37 C), temperature source Oral, resp. rate 18, height 5\' 3"  (1.6 m), weight 51.9 kg (114 lb 6.7 oz), SpO2 92.00%. HEENT: [place]/AT PEERL, EOMI  Heart: Regular rate and rhythm, without murmurs, rubs, gallops, PMI non-displaced, no heaves or thrills on palpation.  Lungs: Clear to auscultation, no wheezing or rhonchi noted. No increased vocal fremitus resonant to percussion  Abdomen: Soft, nontender, nondistended, positive bowel sounds, no masses no hepatosplenomegaly noted..  Neuro: No focal neurological deficits noted cranial nerves II through XII grossly intact. DTRs 2+ bilaterally upper and lower extremities. Strength functional and equal in bilateral upper and lower extremities   Basename 10/18/11 0550 10/17/11 0531  NA 137 136  K 4.0 3.0*  CL 105 102  CO2 27 28  GLUCOSE 59* 118*  BUN 11 13  CREATININE 0.81 0.81  CALCIUM 8.6 8.1*  MG 2.0 --  PHOS -- --   No results found for this basename: AST:2,ALT:2,ALKPHOS:2,BILITOT:2,PROT:2,ALBUMIN:2 in the last 72 hours No results found for this basename: LIPASE:2,AMYLASE:2 in the last 72 hours  Basename 10/15/11 2102  WBC 4.8  NEUTROABS --  HGB 14.3  HCT 40.8  MCV 92.7  PLT 111*   Total time for discharge process including face-to-face time greater than 30 minutes.  Signed: MATTHEWS,MICHELLE A. 10/18/2011, 2:55 PM

## 2011-10-18 NOTE — Progress Notes (Signed)
Social Work order issued to assist patient with home health.  Notified RNCM of this and CSW is signing off.  No CSW needs identified.  Lorri Frederick. [place]  [phone]

## 2011-10-18 NOTE — Progress Notes (Signed)
Physical Therapy Evaluation Patient Details Name: Carmen [name] MRN: [id number] DOB: 10/14/1933 Today's Date: 10/18/2011 Time: 6962-9528 PT Time Calculation (min): 26 min  PT Assessment / Plan / Recommendation Clinical Impression  78 yo female admitted with malignant HTN and [place] CVA (age indeterminate, but new since Jan) presents with some gait assymetries and potential balance deficits; will benefit from acute PT for further balance testing, and to maximize independence and safety to enable safe dc home    PT Assessment  Patient needs continued PT services    Follow Up Recommendations  Home health PT;Supervision/Assistance - 24 hour    Barriers to Discharge None      Equipment Recommendations  None recommended by PT    Recommendations for Other Services OT consult   Frequency Min 4X/week    Precautions / Restrictions Precautions Precautions: Fall   Pertinent Vitals/Pain Denies pain      Mobility  Bed Mobility Bed Mobility: Supine to Sit;Sitting - Scoot to Edge of Bed Supine to Sit: 5: Supervision Sitting - Scoot to Edge of Bed: 5: Supervision Details for Bed Mobility Assistance: Cues fro initiation and technique; no need for physical assist Transfers Transfers: Sit to Stand;Stand to Sit Sit to Stand: 5: Supervision;From bed Stand to Sit: 5: Supervision;To chair/3-in-1;With armrests Details for Transfer Assistance: Cues for safety, and to control descent with stand to sit Ambulation/Gait Ambulation/Gait Assistance: 4: Min guard (with and without physical contact) Ambulation Distance (Feet): 120 Feet Assistive device: None Ambulation/Gait Assistance Details: Noted decr stance time RLE initially, which smoothed out to grossly equal stance time with more amb distance; No gross loss of balance noted Gait Pattern: Decreased stance time - right;Decreased stride length Gait velocity: somewhat decreased Stairs: Yes Stairs Assistance: 4: Min guard Stair Management  Technique: One rail Right;Sideways;Forwards Number of Stairs: 4  (ascended reciprocally) Modified Rankin (Stroke Patients Only) Pre-Morbid Rankin Score: Slight disability Modified Rankin: Slight disability    Exercises     PT Diagnosis: Abnormality of gait  PT Problem List: Decreased balance;Decreased mobility;Decreased coordination;Decreased knowledge of use of DME PT Treatment Interventions: DME instruction;Gait training;Stair training;Functional mobility training;Therapeutic activities;Therapeutic exercise;Balance training;Neuromuscular re-education;Patient/family education   PT Goals Acute Rehab PT Goals PT Goal Formulation: With patient Time For Goal Achievement: 10/25/11 Potential to Achieve Goals: Good Pt will go Supine/Side to Sit: Independently PT Goal: Supine/Side to Sit - Progress: Goal set today Pt will go Sit to Supine/Side: Independently PT Goal: Sit to Supine/Side - Progress: Goal set today Pt will go Sit to Stand: Independently PT Goal: Sit to Stand - Progress: Goal set today Pt will go Stand to Sit: Independently PT Goal: Stand to Sit - Progress: Goal set today Pt will Ambulate: >150 feet;with modified independence;with least restrictive assistive device (versus no device) PT Goal: Ambulate - Progress: Goal set today Pt will Go Up / Down Stairs: 3-5 stairs;with supervision;with rail(s) PT Goal: Up/Down Stairs - Progress: Goal set today Additional Goals Additional Goal #1: Score greater than or equal to 49/56 on Berg to demo decr fall risk PT Goal: Additional Goal #1 - Progress: Goal set today  Visit Information  Last PT Received On: 10/18/11 Assistance Needed: +1    Subjective Data  Subjective: Wanting to go hom Patient Stated Goal: home   Prior Functioning  Home Living Lives With: Spouse;Son Available Help at Discharge: Family;Available PRN/intermittently Type of Home: House Home Access: Stairs to enter [employer] of Steps: 2 Entrance  Stairs-Rails: Right Home Layout: One level Bathroom Shower/Tub: Tub/shower unit  Bathroom Accessibility: Yes How Accessible: Accessible via walker Home Adaptive Equipment: Straight cane;Walker - rolling;Walker - four wheeled (a few "walking sticks") Additional Comments: Pt states her granddaughter, who is a Psychologist, sport and exercise on 4000, helps her when she wants to take a bath (sitting down in the tub) Prior Function Level of Independence: Independent (Uses assistive device prn -- very rarely) Able to Take Stairs?: Yes Driving: No Communication Communication: No difficulties    Cognition  Overall Cognitive Status: Appears within functional limits for tasks assessed/performed Arousal/Alertness: Awake/alert Orientation Level: Appears intact for tasks assessed Behavior During Session: [facility] for tasks performed    Extremity/Trunk Assessment Right Upper Extremity Assessment RUE ROM/Strength/Tone: Deficits RUE ROM/Strength/Tone Deficits: Noted some decr coordination, and abnormal hand opening/movement into ulnar deviation with effortful movement (on stairs, descending" Left Upper Extremity Assessment LUE ROM/Strength/Tone: WFL for tasks assessed Right Lower Extremity Assessment RLE ROM/Strength/Tone: [facility] for tasks assessed Left Lower Extremity Assessment LLE ROM/Strength/Tone: [facility] for tasks assessed Trunk Assessment Trunk Assessment: Normal   Balance Balance Balance Assessed:  (plan on standardized balance assessment next session)  End of Session PT - End of Session Equipment Utilized During Treatment: Gait belt Activity Tolerance: Patient tolerated treatment well Patient left: in chair;with call bell/phone within reach Nurse Communication: Mobility status  GP     Olen Pel [place], [place] [phone]  10/18/2011, 9:44 AM

## 2011-10-18 NOTE — Discharge Summary (Signed)
Pt d/c to home with family. Tele box d/c'd, iv d/c's vital signs stable. Belongings with family. Discharge summary complete verbalizes understanding. Diet handout given and teach back done. Verbalizes understanding.

## 2011-10-18 NOTE — Progress Notes (Signed)
   CARE MANAGEMENT NOTE 10/18/2011  Patient:  Carmen [name], Carmen [name]   Account Number:  [id number]  Date Initiated:  10/16/2011  Documentation initiated by:  Hoy Register  Subjective/Objective Assessment:   77 yr presented to ED from PCP after presenting to PCP because she ran out of her meds and wanted samples.     Action/Plan:   Pending d/c meds to search [email or web address] for coupons   Anticipated DC Date:     Anticipated DC Plan:  HOME W HOME HEALTH SERVICES      DC Planning Services  CM consult  Medication Assistance      Choice offered to / List presented to:             Status of service:  In process, will continue to follow Medicare Important Message given?   (If response is "NO", the following Medicare IM given date fields will be blank) Date Medicare IM given:   Date Additional Medicare IM given:    Discharge Disposition:    Per UR Regulation:  Reviewed for med. necessity/level of care/duration of stay  If discussed at Long Length of Stay Meetings, dates discussed:    Comments:  10/18/2011  [address] RN, [place] [phone] Met with patient to discuss discharge planning and medication costs. She goes to [facility] for her medications, last week she was going to refill the Levemir and it was too soon for a refill. She denies any issues related to costs of medications, except for this instance. She lives at home with her husband and 2 sons and has lots of family support. Discussed PT recommendations for home PT. She selected [facility] for home PT, she has 3:1, cane, RW, shower chair.  [facility]/Marie called regarding discharge plans for home PT.   10/16/11 1010 GHoward,RN,BC,BSN received page call from CSW stating got a referral for medication assistance. Called and spoke to nurse who states patient presented to MD office stating could not get meds. Pt has [employer] so no [facility] Assistance. Once we know what meds she will be discharged on we can look at [email or web address]. Request re-consult at  discharge.

## 2011-10-18 NOTE — Progress Notes (Signed)
Hypoglycemic Event  CBG: 48  Treatment: 15 GM carbohydrate snack  Symptoms: None  Follow-up CBG: Time:0802 CBG Result:88  Possible Reasons for Event: Unknown  Comments/MD notified yes    Burton Gahan S  Remember to initiate Hypoglycemia Order Set & complete

## 2011-10-21 ENCOUNTER — Encounter: Payer: Medicare Other | Admitting: Internal Medicine

## 2011-11-11 ENCOUNTER — Emergency Department (HOSPITAL_COMMUNITY): Payer: Medicare Other

## 2011-11-11 ENCOUNTER — Inpatient Hospital Stay (HOSPITAL_COMMUNITY): Payer: Medicare Other

## 2011-11-11 ENCOUNTER — Encounter (HOSPITAL_COMMUNITY): Payer: Self-pay | Admitting: Emergency Medicine

## 2011-11-11 ENCOUNTER — Inpatient Hospital Stay (HOSPITAL_COMMUNITY)
Admission: EM | Admit: 2011-11-11 | Discharge: 2011-11-13 | DRG: 638 | Disposition: A | Discharge status: Home / Self Care | Payer: Medicare Other | Attending: Internal Medicine | Admitting: Internal Medicine

## 2011-11-11 DIAGNOSIS — F411 Generalized anxiety disorder: Secondary | ICD-10-CM | POA: Diagnosis present

## 2011-11-11 DIAGNOSIS — G629 Polyneuropathy, unspecified: Secondary | ICD-10-CM | POA: Diagnosis present

## 2011-11-11 DIAGNOSIS — N179 Acute kidney failure, unspecified: Secondary | ICD-10-CM | POA: Diagnosis present

## 2011-11-11 DIAGNOSIS — Z720 Tobacco use: Secondary | ICD-10-CM

## 2011-11-11 DIAGNOSIS — E1151 Type 2 diabetes mellitus with diabetic peripheral angiopathy without gangrene: Secondary | ICD-10-CM

## 2011-11-11 DIAGNOSIS — E875 Hyperkalemia: Secondary | ICD-10-CM | POA: Diagnosis present

## 2011-11-11 DIAGNOSIS — K5900 Constipation, unspecified: Secondary | ICD-10-CM | POA: Diagnosis present

## 2011-11-11 DIAGNOSIS — I129 Hypertensive chronic kidney disease with stage 1 through stage 4 chronic kidney disease, or unspecified chronic kidney disease: Secondary | ICD-10-CM | POA: Diagnosis present

## 2011-11-11 DIAGNOSIS — E1149 Type 2 diabetes mellitus with other diabetic neurological complication: Secondary | ICD-10-CM | POA: Diagnosis present

## 2011-11-11 DIAGNOSIS — R634 Abnormal weight loss: Secondary | ICD-10-CM | POA: Diagnosis present

## 2011-11-11 DIAGNOSIS — Z950 Presence of cardiac pacemaker: Secondary | ICD-10-CM | POA: Diagnosis present

## 2011-11-11 DIAGNOSIS — I2510 Atherosclerotic heart disease of native coronary artery without angina pectoris: Secondary | ICD-10-CM | POA: Diagnosis present

## 2011-11-11 DIAGNOSIS — F17200 Nicotine dependence, unspecified, uncomplicated: Secondary | ICD-10-CM | POA: Diagnosis present

## 2011-11-11 DIAGNOSIS — Z8673 Personal history of transient ischemic attack (TIA), and cerebral infarction without residual deficits: Secondary | ICD-10-CM

## 2011-11-11 DIAGNOSIS — I739 Peripheral vascular disease, unspecified: Secondary | ICD-10-CM | POA: Diagnosis present

## 2011-11-11 DIAGNOSIS — G609 Hereditary and idiopathic neuropathy, unspecified: Secondary | ICD-10-CM | POA: Diagnosis present

## 2011-11-11 DIAGNOSIS — E119 Type 2 diabetes mellitus without complications: Secondary | ICD-10-CM | POA: Diagnosis present

## 2011-11-11 DIAGNOSIS — N181 Chronic kidney disease, stage 1: Secondary | ICD-10-CM | POA: Diagnosis present

## 2011-11-11 DIAGNOSIS — K3184 Gastroparesis: Secondary | ICD-10-CM | POA: Diagnosis present

## 2011-11-11 DIAGNOSIS — I10 Essential (primary) hypertension: Secondary | ICD-10-CM | POA: Diagnosis present

## 2011-11-11 DIAGNOSIS — R112 Nausea with vomiting, unspecified: Secondary | ICD-10-CM | POA: Diagnosis present

## 2011-11-11 DIAGNOSIS — E1100 Type 2 diabetes mellitus with hyperosmolarity without nonketotic hyperglycemic-hyperosmolar coma (NKHHC): Principal | ICD-10-CM | POA: Diagnosis present

## 2011-11-11 DIAGNOSIS — E1101 Type 2 diabetes mellitus with hyperosmolarity with coma: Secondary | ICD-10-CM

## 2011-11-11 DIAGNOSIS — E870 Hyperosmolality and hypernatremia: Secondary | ICD-10-CM | POA: Diagnosis present

## 2011-11-11 LAB — COMPREHENSIVE METABOLIC PANEL
ALT: 135 U/L — ABNORMAL HIGH (ref 0–35)
ALT: 122 U/L — ABNORMAL HIGH (ref 0–35)
AST: 37 U/L (ref 0–37)
AST: 34 U/L (ref 0–37)
Albumin: 3.8 g/dL (ref 3.5–5.2)
Alkaline Phosphatase: 144 U/L — ABNORMAL HIGH (ref 39–117)
Calcium: 10.2 mg/dL (ref 8.4–10.5)
Calcium: 10.2 mg/dL (ref 8.4–10.5)
Creatinine, Ser: 1.31 mg/dL — ABNORMAL HIGH (ref 0.50–1.10)
GFR calc Af Amer: 43 mL/min — ABNORMAL LOW (ref >90)
GFR calc Af Amer: 45 mL/min — ABNORMAL LOW (ref >90)
Glucose, Bld: 584 mg/dL (ref 70–99)
Potassium: 5.2 mEq/L — ABNORMAL HIGH (ref 3.5–5.1)
Sodium: 127 mEq/L — ABNORMAL LOW (ref 135–145)
Sodium: 131 mEq/L — ABNORMAL LOW (ref 135–145)
Total Protein: 7.1 g/dL (ref 6.0–8.3)
Total Protein: 6.5 g/dL (ref 6.0–8.3)

## 2011-11-11 LAB — BASIC METABOLIC PANEL
BUN: 40 mg/dL — ABNORMAL HIGH (ref 6–23)
CO2: 29 mEq/L (ref 19–32)
Chloride: 84 mEq/L — ABNORMAL LOW (ref 96–112)
Chloride: 100 mEq/L (ref 96–112)
Creatinine, Ser: 1.62 mg/dL — ABNORMAL HIGH (ref 0.50–1.10)
Creatinine, Ser: 1.16 mg/dL — ABNORMAL HIGH (ref 0.50–1.10)
GFR calc Af Amer: 52 mL/min — ABNORMAL LOW (ref >90)
Glucose, Bld: 1152 mg/dL (ref 70–99)
Potassium: 6 mEq/L — ABNORMAL HIGH (ref 3.5–5.1)
Potassium: 3.8 mEq/L (ref 3.5–5.1)

## 2011-11-11 LAB — CBC WITH DIFFERENTIAL/PLATELET
Basophils Absolute: 0 10*3/uL (ref 0.0–0.1)
Basophils Relative: 0 % (ref 0–1)
Eosinophils Absolute: 0.1 10*3/uL (ref 0.0–0.7)
Eosinophils Relative: 2 % (ref 0–5)
HCT: 52 % — ABNORMAL HIGH (ref 36.0–46.0)
HCT: 46.1 % — ABNORMAL HIGH (ref 36.0–46.0)
Hemoglobin: 18 g/dL — ABNORMAL HIGH (ref 12.0–15.0)
Hemoglobin: 16.5 g/dL — ABNORMAL HIGH (ref 12.0–15.0)
Lymphocytes Relative: 30 % (ref 12–46)
Lymphs Abs: 1.4 10*3/uL (ref 0.7–4.0)
Lymphs Abs: 1.6 10*3/uL (ref 0.7–4.0)
MCH: 33.7 pg (ref 26.0–34.0)
MCHC: 34.6 g/dL (ref 30.0–36.0)
MCV: 92.4 fL (ref 78.0–100.0)
Monocytes Absolute: 0.2 10*3/uL (ref 0.1–1.0)
Monocytes Absolute: 0.2 10*3/uL (ref 0.1–1.0)
Monocytes Relative: 4 % (ref 3–12)
Monocytes Relative: 3 % (ref 3–12)
Neutro Abs: 3.7 10*3/uL (ref 1.7–7.7)
Neutro Abs: 3.6 10*3/uL (ref 1.7–7.7)
Neutrophils Relative %: 69 % (ref 43–77)
RBC: 5.34 MIL/uL — ABNORMAL HIGH (ref 3.87–5.11)
RDW: 14.8 % (ref 11.5–15.5)
WBC: 5.6 10*3/uL (ref 4.0–10.5)

## 2011-11-11 LAB — URINE MICROSCOPIC-ADD ON

## 2011-11-11 LAB — GLUCOSE, CAPILLARY
Glucose-Capillary: >600 mg/dL (ref 70–99)
Glucose-Capillary: 179 mg/dL — ABNORMAL HIGH (ref 70–99)
Glucose-Capillary: 112 mg/dL — ABNORMAL HIGH (ref 70–99)

## 2011-11-11 LAB — URINALYSIS, ROUTINE W REFLEX MICROSCOPIC
Glucose, UA: >1000 mg/dL — AB
Hgb urine dipstick: NEGATIVE
pH: 6 (ref 5.0–8.0)

## 2011-11-11 MED ORDER — ASPIRIN 81 MG PO CHEW
81.0000 mg | CHEWABLE_TABLET | Freq: Every day | ORAL | Status: DC
  Administered 2011-11-11 – 2011-11-13 (×3): 81 mg via ORAL
  Filled 2011-11-11 (×3): qty 1

## 2011-11-11 MED ORDER — ISOSORBIDE MONONITRATE ER 60 MG PO TB24
60.0000 mg | ORAL_TABLET | Freq: Every day | ORAL | Status: DC
Start: 2011-11-11 — End: 2011-11-13
  Administered 2011-11-11 – 2011-11-13 (×2): 60 mg via ORAL
  Filled 2011-11-11 (×3): qty 1

## 2011-11-11 MED ORDER — SODIUM CHLORIDE 0.9 % IV BOLUS (SEPSIS)
1000.0000 mL | Freq: Once | INTRAVENOUS | Status: AC
  Administered 2011-11-11: 1000 mL via INTRAVENOUS

## 2011-11-11 MED ORDER — SODIUM CHLORIDE 0.9 % IV SOLN
INTRAVENOUS | Status: DC
  Administered 2011-11-11: 5.4 [IU]/h via INTRAVENOUS
  Filled 2011-11-11: qty 1

## 2011-11-11 MED ORDER — INSULIN REGULAR BOLUS VIA INFUSION
0.0000 [IU] | Freq: Three times a day (TID) | INTRAVENOUS | Status: DC
  Filled 2011-11-11: qty 10

## 2011-11-11 MED ORDER — METOCLOPRAMIDE HCL 5 MG/ML IJ SOLN
5.0000 mg | Freq: Four times a day (QID) | INTRAMUSCULAR | Status: DC
  Administered 2011-11-11 – 2011-11-12 (×4): 5 mg via INTRAVENOUS
  Filled 2011-11-11 (×8): qty 1

## 2011-11-11 MED ORDER — OMEGA-3-ACID ETHYL ESTERS 1 G PO CAPS
1.0000 g | ORAL_CAPSULE | Freq: Two times a day (BID) | ORAL | Status: DC
  Administered 2011-11-11 – 2011-11-13 (×5): 1 g via ORAL
  Filled 2011-11-11 (×7): qty 1

## 2011-11-11 MED ORDER — INSULIN GLARGINE 100 UNIT/ML ~~LOC~~ SOLN
10.0000 [IU] | Freq: Once | SUBCUTANEOUS | Status: AC
  Administered 2011-11-11: 10 [IU] via SUBCUTANEOUS

## 2011-11-11 MED ORDER — ONDANSETRON HCL 4 MG PO TABS: 4.0000 mg | ORAL_TABLET | Freq: Four times a day (QID) | ORAL | Status: DC | PRN

## 2011-11-11 MED ORDER — SODIUM CHLORIDE 0.9 % IV SOLN: INTRAVENOUS | Status: DC

## 2011-11-11 MED ORDER — SODIUM CHLORIDE 0.9 % IV SOLN
INTRAVENOUS | Status: DC
  Filled 2011-11-11: qty 1

## 2011-11-11 MED ORDER — SODIUM CHLORIDE 0.9 % IJ SOLN
3.0000 mL | Freq: Two times a day (BID) | INTRAMUSCULAR | Status: DC
  Administered 2011-11-12 (×2): 3 mL via INTRAVENOUS

## 2011-11-11 MED ORDER — INSULIN ASPART 100 UNIT/ML ~~LOC~~ SOLN
10.0000 [IU] | Freq: Once | SUBCUTANEOUS | Status: AC
  Administered 2011-11-11: 10 [IU] via INTRAVENOUS
  Filled 2011-11-11: qty 1

## 2011-11-11 MED ORDER — DEXTROSE 50 % IV SOLN: 25.0000 mL | INTRAVENOUS | Status: DC | PRN

## 2011-11-11 MED ORDER — INSULIN DETEMIR 100 UNIT/ML ~~LOC~~ SOLN
5.0000 [IU] | Freq: Two times a day (BID) | SUBCUTANEOUS | Status: DC
  Filled 2011-11-11 (×2): qty 10

## 2011-11-11 MED ORDER — ACETAMINOPHEN 650 MG RE SUPP: 650.0000 mg | Freq: Four times a day (QID) | RECTAL | Status: DC | PRN

## 2011-11-11 MED ORDER — SODIUM CHLORIDE 0.9 % IV SOLN
INTRAVENOUS | Status: DC
  Administered 2011-11-11 (×2): via INTRAVENOUS

## 2011-11-11 MED ORDER — DEXTROSE-NACL 5-0.45 % IV SOLN: INTRAVENOUS | Status: DC

## 2011-11-11 MED ORDER — HYDRALAZINE HCL 50 MG PO TABS
50.0000 mg | ORAL_TABLET | Freq: Four times a day (QID) | ORAL | Status: DC
  Administered 2011-11-11 – 2011-11-13 (×6): 50 mg via ORAL
  Filled 2011-11-11 (×12): qty 1

## 2011-11-11 MED ORDER — HYDROCODONE-ACETAMINOPHEN 5-325 MG PO TABS: 1.0000 | ORAL_TABLET | ORAL | Status: DC | PRN

## 2011-11-11 MED ORDER — LOSARTAN POTASSIUM 50 MG PO TABS
50.0000 mg | ORAL_TABLET | Freq: Every day | ORAL | Status: DC
  Administered 2011-11-12 – 2011-11-13 (×2): 50 mg via ORAL
  Filled 2011-11-11 (×2): qty 1

## 2011-11-11 MED ORDER — POLYETHYLENE GLYCOL 3350 17 G PO PACK
17.0000 g | PACK | Freq: Two times a day (BID) | ORAL | Status: DC
  Administered 2011-11-11 – 2011-11-13 (×4): 17 g via ORAL
  Filled 2011-11-11 (×5): qty 1

## 2011-11-11 MED ORDER — ACETAMINOPHEN 325 MG PO TABS: 650.0000 mg | ORAL_TABLET | Freq: Four times a day (QID) | ORAL | Status: DC | PRN

## 2011-11-11 MED ORDER — LOSARTAN POTASSIUM 50 MG PO TABS
50.0000 mg | ORAL_TABLET | Freq: Every day | ORAL | Status: DC
  Filled 2011-11-11: qty 1

## 2011-11-11 MED ORDER — ATORVASTATIN CALCIUM 20 MG PO TABS
20.0000 mg | ORAL_TABLET | Freq: Every day | ORAL | Status: DC
  Administered 2011-11-11 – 2011-11-12 (×2): 20 mg via ORAL
  Filled 2011-11-11 (×3): qty 1

## 2011-11-11 MED ORDER — IOHEXOL 300 MG/ML  SOLN
20.0000 mL | INTRAMUSCULAR | Status: AC
  Administered 2011-11-11: 20 mL via ORAL

## 2011-11-11 MED ORDER — METOPROLOL TARTRATE 25 MG PO TABS
25.0000 mg | ORAL_TABLET | Freq: Two times a day (BID) | ORAL | Status: DC
  Administered 2011-11-11 – 2011-11-13 (×5): 25 mg via ORAL
  Filled 2011-11-11 (×6): qty 1

## 2011-11-11 MED ORDER — ONDANSETRON HCL 4 MG/2ML IJ SOLN: 4.0000 mg | Freq: Four times a day (QID) | INTRAMUSCULAR | Status: DC | PRN

## 2011-11-11 MED ORDER — MEGESTROL ACETATE 40 MG/ML PO SUSP
400.0000 mg | Freq: Every day | ORAL | Status: DC
  Administered 2011-11-11: 400 mg via ORAL
  Filled 2011-11-11 (×2): qty 10

## 2011-11-11 MED ORDER — ALPRAZOLAM 0.5 MG PO TABS
0.5000 mg | ORAL_TABLET | Freq: Every day | ORAL | Status: DC | PRN
  Administered 2011-11-13: 0.5 mg via ORAL
  Filled 2011-11-11: qty 1

## 2011-11-11 MED ORDER — CILOSTAZOL 100 MG PO TABS
100.0000 mg | ORAL_TABLET | Freq: Two times a day (BID) | ORAL | Status: DC
  Administered 2011-11-11 – 2011-11-13 (×5): 100 mg via ORAL
  Filled 2011-11-11 (×6): qty 1

## 2011-11-11 MED ORDER — SODIUM CHLORIDE 0.9 % IV SOLN
1.0000 g | Freq: Once | INTRAVENOUS | Status: AC
  Administered 2011-11-11: 1 g via INTRAVENOUS
  Filled 2011-11-11: qty 10

## 2011-11-11 MED ORDER — INSULIN ASPART 100 UNIT/ML ~~LOC~~ SOLN
0.0000 [IU] | SUBCUTANEOUS | Status: DC
  Administered 2011-11-11: 1 [IU] via SUBCUTANEOUS
  Administered 2011-11-11: 3 [IU] via SUBCUTANEOUS
  Administered 2011-11-12: 1 [IU] via SUBCUTANEOUS
  Administered 2011-11-12 (×2): 2 [IU] via SUBCUTANEOUS

## 2011-11-11 NOTE — ED Notes (Signed)
MD is made aware of pt lab results of blood sugar and to follow up with plan of care.

## 2011-11-11 NOTE — ED Notes (Signed)
Pt assisted to bedside commode.  Urine specimen collected.

## 2011-11-11 NOTE — Progress Notes (Signed)
pts BP 92/38, HR NSR 70s, pt asymptomatic; Dr.Ghimire made aware, no new orders received;1800 hydrazaline held, will continue to monitor

## 2011-11-11 NOTE — ED Provider Notes (Signed)
History     CSN: [id number]  Arrival date & time 11/11/11  0020   First MD Initiated Contact with Patient 11/11/11 0134      Chief Complaint  Patient presents with  . Hyperglycemia    (Consider location/radiation/quality/duration/timing/severity/associated sxs/prior treatment) HPI HX per PT and family bedisde, is diabetic and sick last few days with vomiting and dec appetitie, blood sugar elevated tonight and brought here for evaluation, no F/C, no SOB or CP, no ABD pain or diarrhea, no bloody or bilious emesis. Mod in severity. No AMS Past Medical History  Diagnosis Date  . Diabetes mellitus   . Hypertension   . Coronary artery disease   . Renal disorder   . Herniated lumbar intervertebral disc   . Cataract     cataract surgery scheduled for 03/31/11, 2 - left eye, 1 - right eye  . Renovascular hypertension      s/p left renal artery stent 12/2007.   S/P balloon angioplasty on 02/16/10 for ISR, BP was  controlled well since then.     . Claudication in peripheral vascular disease     02/16/10: Left CIA 9.0x28 Omnilink and REIA 8.0x40 seff expanding [place].   right subclavian artery stent 03/18/2008,  . S/P carotid endarterectomy      left carotid endarterectomy  1991; Stroke and TIA in 1999 with right sided weakness, now with residual right arm weakness  . Stroke     TIA  . Peripheral vascular disease   . Anxiety     Past Surgical History  Procedure Date  . Appendectomy   . Abdominal hysterectomy   . Ureteral stent placement   . Carotid endarterectomy   . Laminectomy   . Back surgery   . Colonoscopy 07/30/2011    Procedure: COLONOSCOPY;  Surgeon: Louis Meckel, MD;  Location: [facility] ENDOSCOPY;  Service: Endoscopy;  Laterality: N/A;  gi bleed  . Esophagogastroduodenoscopy 07/30/2011    Procedure: ESOPHAGOGASTRODUODENOSCOPY (EGD);  Surgeon: Louis Meckel, MD;  Location: [facility] ENDOSCOPY;  Service: Endoscopy;  Laterality: N/A;  . Pacemaker insertion     No family history on  file.  History  Substance Use Topics  . Smoking status: Current Everyday Smoker -- 0.5 packs/day for 65 years    Types: Cigarettes  . Smokeless tobacco: Never Used  . Alcohol Use: No    OB History    Grav Para Term Preterm Abortions TAB SAB Ect Mult Living                  Review of Systems  Constitutional: Negative for fever and chills.  HENT: Negative for neck pain and neck stiffness.   Eyes: Negative for pain.  Respiratory: Negative for shortness of breath.   Cardiovascular: Negative for chest pain.  Gastrointestinal: Positive for nausea and vomiting. Negative for abdominal pain and blood in stool.  Genitourinary: Negative for dysuria.  Musculoskeletal: Negative for back pain.  Skin: Negative for rash.  Neurological: Negative for headaches.  All other systems reviewed and are negative.    Allergies  Norvasc and Peanut-containing drug products  Home Medications   Current Outpatient Rx  Name Route Sig Dispense Refill  . ALPRAZOLAM 0.5 MG PO TABS Oral Take 0.5 mg by mouth daily as needed. For anxiety    . ASPIRIN 81 MG PO CHEW Oral Chew 1 tablet (81 mg total) by mouth daily. 30 tablet 0  . CILOSTAZOL 100 MG PO TABS Oral Take 100 mg by mouth 2 (two) times daily.    Marland Kitchen  GLUCERNA SHAKE PO LIQD Oral Take 237 mLs by mouth 2 (two) times daily between meals.    . FUROSEMIDE 40 MG PO TABS Oral Take 40 mg by mouth daily.    Marland Kitchen HYDRALAZINE HCL 50 MG PO TABS Oral Take 50 mg by mouth 4 (four) times daily.     Marland Kitchen HYDROCODONE-ACETAMINOPHEN 5-500 MG PO TABS Oral Take 1 tablet by mouth every 4 (four) hours as needed. For pain    . INSULIN DETEMIR 100 UNIT/ML [place] SOLN Subcutaneous Inject 5 Units into the skin 2 (two) times daily.    . ISOSORBIDE MONONITRATE ER 60 MG PO TB24 Oral Take 60 mg by mouth daily.    Marland Kitchen LOSARTAN POTASSIUM 100 MG PO TABS Oral Take 50 mg by mouth daily.    Marland Kitchen MAGNESIUM CHLORIDE 64 MG PO TBEC Oral Take 1 tablet by mouth daily.    Marland Kitchen METOPROLOL TARTRATE 25 MG PO TABS Oral  Take 25 mg by mouth 2 (two) times daily.    . OMEGA-3-ACID ETHYL ESTERS 1 G PO CAPS Oral Take 1 g by mouth 2 (two) times daily.    Marland Kitchen POTASSIUM CHLORIDE CRYS ER 20 MEQ PO TBCR Oral Take 20 mEq by mouth 2 (two) times daily.    Marland Kitchen ROSUVASTATIN CALCIUM 10 MG PO TABS Oral Take 10 mg by mouth daily.    Marland Kitchen SPIRONOLACTONE 50 MG PO TABS Oral Take 1 tablet by mouth Daily.      BP 172/54  Pulse 64  Temp 98.3 F (36.8 C) (Oral)  Resp 14  SpO2 96%  Physical Exam  Constitutional: She is oriented to person, place, and time. She appears well-developed and well-nourished.  HENT:  Head: Normocephalic and atraumatic.       Dry mm  Eyes: Conjunctivae and EOM are normal. Pupils are equal, round, and reactive to light.  Neck: Trachea normal. Neck supple. No thyromegaly present.  Cardiovascular: Normal rate, regular rhythm, S1 normal, S2 normal and normal pulses.     No systolic murmur is present   No diastolic murmur is present  Pulses:      Radial pulses are 2+ on the right side, and 2+ on the left side.  Pulmonary/Chest: Effort normal and breath sounds normal. She has no wheezes. She has no rhonchi. She has no rales. She exhibits no tenderness.  Abdominal: Soft. Normal appearance and bowel sounds are normal. There is no tenderness. There is no CVA tenderness and negative Murphy's sign.  Musculoskeletal:       BLE:s Calves nontender, no cords or erythema, negative Homans sign  Neurological: She is alert and oriented to person, place, and time. She has normal strength. No cranial nerve deficit or sensory deficit. GCS eye subscore is 4. GCS verbal subscore is 5. GCS motor subscore is 6.  Skin: Skin is warm and dry. No rash noted. She is not diaphoretic.  Psychiatric: Her speech is normal.       Cooperative and appropriate    ED Course  Procedures (including critical care time)  Results for orders placed during the hospital encounter of 11/11/11  CBC WITH DIFFERENTIAL      Component Value Range    WBC 5.5  4.0 - 10.5 K/uL   RBC 5.34 (*) 3.87 - 5.11 MIL/uL   Hemoglobin 18.0 (*) 12.0 - 15.0 g/dL   HCT 78.2 (*) 95.6 - 21.3 %   MCV 97.4  78.0 - 100.0 fL   MCH 33.7  26.0 - 34.0 pg   MCHC  34.6  30.0 - 36.0 g/dL   RDW 96.0  45.4 - 09.8 %   Platelets 154  150 - 400 K/uL   Neutrophils Relative 69  43 - 77 %   Neutro Abs 3.7  1.7 - 7.7 K/uL   Lymphocytes Relative 26  12 - 46 %   Lymphs Abs 1.4  0.7 - 4.0 K/uL   Monocytes Relative 4  3 - 12 %   Monocytes Absolute 0.2  0.1 - 1.0 K/uL   Eosinophils Relative 1  0 - 5 %   Eosinophils Absolute 0.1  0.0 - 0.7 K/uL   Basophils Relative 0  0 - 1 %   Basophils Absolute 0.0  0.0 - 0.1 K/uL  BASIC METABOLIC PANEL      Component Value Range   Sodium 126 (*) 135 - 145 mEq/L   Potassium 6.0 (*) 3.5 - 5.1 mEq/L   Chloride 84 (*) 96 - 112 mEq/L   CO2 33 (*) 19 - 32 mEq/L   Glucose, Bld 1152 (*) 70 - 99 mg/dL   BUN 40 (*) 6 - 23 mg/dL   Creatinine, Ser 1.19 (*) 0.50 - 1.10 mg/dL   Calcium 14.7 (*) 8.4 - 10.5 mg/dL   GFR calc non Af Amer 30 (*) >90 mL/min   GFR calc Af Amer 34 (*) >90 mL/min  GLUCOSE, CAPILLARY      Component Value Range   Glucose-Capillary >600 (*) 70 - 99 mg/dL   Comment 1 Notify RN    URINALYSIS, ROUTINE W REFLEX MICROSCOPIC      Component Value Range   Color, Urine YELLOW  YELLOW   APPearance CLEAR  CLEAR   Specific Gravity, Urine 1.026  1.005 - 1.030   pH 6.0  5.0 - 8.0   Glucose, UA >1000 (*) NEGATIVE mg/dL   Hgb urine dipstick NEGATIVE  NEGATIVE   Bilirubin Urine NEGATIVE  NEGATIVE   Ketones, ur NEGATIVE  NEGATIVE mg/dL   Protein, ur 30 (*) NEGATIVE mg/dL   Urobilinogen, UA 0.2  0.0 - 1.0 mg/dL   Nitrite NEGATIVE  NEGATIVE   Leukocytes, UA SMALL (*) NEGATIVE  URINE MICROSCOPIC-ADD ON      Component Value Range   Squamous Epithelial / LPF RARE  RARE   WBC, UA 11-20  <3 WBC/hpf   Bacteria, UA RARE  RARE  COMPREHENSIVE METABOLIC PANEL      Component Value Range   Sodium 127 (*) 135 - 145 mEq/L   Potassium 5.2  (*) 3.5 - 5.1 mEq/L   Chloride 89 (*) 96 - 112 mEq/L   CO2 30  19 - 32 mEq/L   Glucose, Bld 1002 (*) 70 - 99 mg/dL   BUN 37 (*) 6 - 23 mg/dL   Creatinine, Ser 8.29 (*) 0.50 - 1.10 mg/dL   Calcium 56.2  8.4 - 13.0 mg/dL   Total Protein 7.1  6.0 - 8.3 g/dL   Albumin 3.8  3.5 - 5.2 g/dL   AST 37  0 - 37 U/L   ALT 135 (*) 0 - 35 U/L   Alkaline Phosphatase 144 (*) 39 - 117 U/L   Total Bilirubin 0.2 (*) 0.3 - 1.2 mg/dL   GFR calc non Af Amer 37 (*) >90 mL/min   GFR calc Af Amer 43 (*) >90 mL/min  GLUCOSE, CAPILLARY      Component Value Range   Glucose-Capillary >600 (*) 70 - 99 mg/dL   Comment 1 Documented in Chart     Comment 2  Notify RN    COMPREHENSIVE METABOLIC PANEL      Component Value Range   Sodium 131 (*) 135 - 145 mEq/L   Potassium 4.4  3.5 - 5.1 mEq/L   Chloride 95 (*) 96 - 112 mEq/L   CO2 23  19 - 32 mEq/L   Glucose, Bld 584 (*) 70 - 99 mg/dL   BUN 35 (*) 6 - 23 mg/dL   Creatinine, Ser 9.60 (*) 0.50 - 1.10 mg/dL   Calcium 45.4  8.4 - 09.8 mg/dL   Total Protein 6.5  6.0 - 8.3 g/dL   Albumin 3.4 (*) 3.5 - 5.2 g/dL   AST 34  0 - 37 U/L   ALT 122 (*) 0 - 35 U/L   Alkaline Phosphatase 132 (*) 39 - 117 U/L   Total Bilirubin 0.2 (*) 0.3 - 1.2 mg/dL   GFR calc non Af Amer 38 (*) >90 mL/min   GFR calc Af Amer 45 (*) >90 mL/min  BASIC METABOLIC PANEL      Component Value Range   Sodium 138  135 - 145 mEq/L   Potassium 3.8  3.5 - 5.1 mEq/L   Chloride 100  96 - 112 mEq/L   CO2 29  19 - 32 mEq/L   Glucose, Bld 239 (*) 70 - 99 mg/dL   BUN 32 (*) 6 - 23 mg/dL   Creatinine, Ser 1.19 (*) 0.50 - 1.10 mg/dL   Calcium 14.7  8.4 - 82.9 mg/dL   GFR calc non Af Amer 45 (*) >90 mL/min   GFR calc Af Amer 52 (*) >90 mL/min  CBC WITH DIFFERENTIAL      Component Value Range   WBC 5.6  4.0 - 10.5 K/uL   RBC 4.99  3.87 - 5.11 MIL/uL   Hemoglobin 16.5 (*) 12.0 - 15.0 g/dL   HCT 56.2 (*) 13.0 - 86.5 %   MCV 92.4  78.0 - 100.0 fL   MCH 33.1  26.0 - 34.0 pg   MCHC 35.8  30.0 - 36.0 g/dL    RDW 78.4  69.6 - 29.5 %   Platelets 149 (*) 150 - 400 K/uL   Neutrophils Relative 65  43 - 77 %   Neutro Abs 3.6  1.7 - 7.7 K/uL   Lymphocytes Relative 30  12 - 46 %   Lymphs Abs 1.6  0.7 - 4.0 K/uL   Monocytes Relative 3  3 - 12 %   Monocytes Absolute 0.2  0.1 - 1.0 K/uL   Eosinophils Relative 2  0 - 5 %   Eosinophils Absolute 0.1  0.0 - 0.7 K/uL   Basophils Relative 0  0 - 1 %   Basophils Absolute 0.0  0.0 - 0.1 K/uL  TSH      Component Value Range   TSH 3.005  0.350 - 4.500 uIU/mL  TROPONIN I      Component Value Range   Troponin I <0.30  <0.30 ng/mL  GLUCOSE, CAPILLARY      Component Value Range   Glucose-Capillary >600 (*) 70 - 99 mg/dL   Comment 1 Notify RN    GLUCOSE, CAPILLARY      Component Value Range   Glucose-Capillary 553 (*) 70 - 99 mg/dL   Comment 1 Notify RN    GLUCOSE, CAPILLARY      Component Value Range   Glucose-Capillary 396 (*) 70 - 99 mg/dL  GLUCOSE, CAPILLARY      Component Value Range   Glucose-Capillary 280 (*) 70 -  99 mg/dL  GLUCOSE, CAPILLARY      Component Value Range   Glucose-Capillary 179 (*) 70 - 99 mg/dL  GLUCOSE, CAPILLARY      Component Value Range   Glucose-Capillary 112 (*) 70 - 99 mg/dL  HEMOGLOBIN Y7W      Component Value Range   Hemoglobin A1C 16.2 (*) <5.7 %   Mean Plasma Glucose 418 (*) <117 mg/dL  GLUCOSE, CAPILLARY      Component Value Range   Glucose-Capillary 122 (*) 70 - 99 mg/dL  GLUCOSE, CAPILLARY      Component Value Range   Glucose-Capillary 243 (*) 70 - 99 mg/dL   Comment 1 Notify RN    GLUCOSE, CAPILLARY      Component Value Range   Glucose-Capillary 198 (*) 70 - 99 mg/dL   Comment 1 Notify RN    GLUCOSE, CAPILLARY      Component Value Range   Glucose-Capillary 152 (*) 70 - 99 mg/dL   Comment 1 Notify RN     Ct Angio Head W/cm &/or Wo Cm  10/17/2011  *RADIOLOGY REPORT*  Clinical Data:  Diabetic hypertensive patient with renal disorder and coronary artery disease.  Altered mental status.  No new  neurological findings otherwise noted. Pacemaker and cannot have MR.  Left carotid endarterectomy 1991.  CT ANGIOGRAPHY HEAD AND NECK  Technique:  Multidetector CT imaging of the head and neck was performed using the standard protocol during bolus administration of intravenous contrast.  Multiplanar CT image reconstructions including MIPs were obtained to evaluate the vascular anatomy. Carotid stenosis measurements (when applicable) are obtained utilizing NASCET criteria, using the distal internal carotid diameter as the denominator.  Contrast: 50mL OMNIPAQUE IOHEXOL 350 MG/ML SOLN  Comparison:  10/15/2011 head CT.  CTA NECK  Findings:  Pacemaker in place.  Atherosclerotic type changes aortic arch with ectasia.  Normal configuration of the origin of the great vessels from the aortic arch.  Mild narrowing proximal innominate artery.  Mild to moderate narrowing 2 cm above the origin of the innominate artery.  Stent within the proximal right subclavian artery with moderate narrowing.  Mild narrowing proximal right common carotid artery.  Plaque proximal left internal carotid artery with narrowing most notable 1.8 cm above the origin of the left internal carotid artery where there is a shelf-like plaque with focal 62% diameter stenosis.  Ectatic vertical cervical segment of the right internal carotid artery with fold at the C1 level with narrowing.  Calcification with mild narrowing proximal left common carotid artery.  Fold 4 cm beyond its origin with mild narrowing.  Prior left carotid endarterectomy.  Calcification along the endarterectomy site with mild narrowing but without hemodynamically significant stenosis.  Ectatic vertical segment left internal carotid artery.  Moderate to slightly marked narrowing proximal right vertebral artery.  Mild irregularity left subclavian artery without significant narrowing.  The left vertebral artery is dominant.  Fold of the left vertebral artery 2 cm above its origin with plaque  and mild narrowing.  Degenerative changes cervical spine most notable C5-6 and less so C4-5.  Mild spinal stenosis with minimal cord contact at both of these levels.  Small nodules in the thyroid gland largest left lobe measuring 6 mm.  Nonspecific 1 cm low density structure extends from the base of the right palatine tonsil to the epiglottis.   Review of the MIP images confirms the above findings.  IMPRESSION: Stent within the proximal right subclavian artery with moderate narrowing.  Plaque proximal left internal carotid artery  with narrowing most notable 1.8 cm above the origin of the left internal carotid artery where there is a shelf-like plaque with focal 62% diameter stenosis.  Prior left carotid endarterectomy.  Calcification along the endarterectomy site with mild narrowing but without hemodynamically significant stenosis.  Moderate to slightly marked narrowing proximal right vertebral artery.  Please see above for additional findings.  CTA HEAD  Findings:  Since the recent CT examination, there is loss of sulci in the superior right convexity (series 2 images 24 through 26) with mild enhancement suggesting acute to posterior right frontal lobe infarct.  As the patient has a arteriovenous malformation of the anterior left opercular region, one could raise possibility of subarachnoid hemorrhage causing loss of sulci in the right frontal lobe however, there is no subarachnoid hemorrhage surrounding the left frontal opercular arteriovenous malformation and subarachnoid hemorrhage is felt to be a much less likely consideration for the right frontal lobe findings.  Encephalitis also felt unlikely (to be considered if of clinical concern). Tumor felt unlikely.  Prominent tangle of vessels anterior left opercular region consistent with arteriovenous malformation. No adjacent subarachnoid hemorrhage.  Right pontine small infarct appears remote.  Remote right thalamic infarct.  Small vessel disease type changes.   Global atrophy without hydrocephalus.  Atherosclerotic type changes cavernous segment of the internal carotid artery bilaterally with mild to slightly moderate narrowing.  Mild irregularity and narrowing of the M1 segment of the right middle cerebral artery with mild to slightly moderate narrowing right middle cerebral artery bifurcation.  After the takeoff of the right PICA, small right vertebral artery.  Mild narrowing distal left vertebral artery after the takeoff of the left PICA.  Slight irregularity of small caliber basilar artery without high- grade stenosis.   Review of the MIP images confirms the above findings.  IMPRESSION: Acute right frontal lobe infarct suspected as detailed above.  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  Remote infarcts, small vessel disease type changes and intracranial atherosclerotic type changes as detailed above.  Critical Value/emergent results were called by telephone at the time of interpretation on 10/17/2011 at 6:06 p.m. to Dr. Terrace Arabia, who verbally acknowledged these results.  Original Report Authenticated By: Fuller Canada, M.D.   Dg Chest 2 View  10/15/2011  *RADIOLOGY REPORT*  Clinical Data: Weakness  CHEST - 2 VIEW  Comparison: 07/20/2011  Findings: Increased interstitial markings.  No frank interstitial edema.  No pleural effusion or pneumothorax.  The heart is top normal in size.  Left subclavian pacemaker.  Degenerative changes of the visualized thoracolumbar spine.  IMPRESSION: No evidence of acute cardiopulmonary disease.  Original Report Authenticated By: Charline Bills, M.D.   Ct Head Wo Contrast  11/11/2011  *RADIOLOGY REPORT*  Clinical Data: Hyperglycemia, generalized body weakness, history hypertension, diabetes, coronary artery disease, prior stroke  CT HEAD WITHOUT CONTRAST  Technique:  Contiguous axial images were obtained from the base of the skull through the vertex without contrast.  Comparison: 10/17/2011 CTA head   Findings: Generalized atrophy. Stable ventricular morphology. No midline shift or mass effect. Old lacunar infarct infarct at right pons. Area of low attenuation identified anterior to the left basal ganglia, corresponding to AVM identified on the prior exam. Mild small vessel chronic ischemic changes of deep cerebral white matter. No intracranial hemorrhage, mass lesion, or evidence of acute infarction. No extra-axial fluid collections. Visualized paranasal sinuses and mastoid air cells clear. Atherosclerotic calcification of internal carotid arteries at skull base. Calvaria intact.  IMPRESSION: Atrophy  with mild small vessel chronic ischemic changes of deep cerebral white matter. Old lacunar infarct at right pons. Known AVM anterior to left basal ganglia. No acute intracranial abnormalities.   Original Report Authenticated By: Lollie Marrow, M.D.    Ct Head Wo Contrast  10/15/2011  *RADIOLOGY REPORT*  Clinical Data: Altered mental status  CT HEAD WITHOUT CONTRAST  Technique:  Contiguous axial images were obtained from the base of the skull through the vertex without contrast.  Comparison: 03/28/2011  Findings: Chronic ischemic changes in the basal ganglia and periventricular white matter.  White matter lesion involving the corpus callosum is suspected and is not significantly changed. Ischemic changes in the right pons is a new finding.  Age is indeterminate.  No mass effect, midline shift, or acute intracranial hemorrhage mastoid air cells are clear.  Cranium is intact.  IMPRESSION: New infarct in the right pons.  Age is indeterminate.  MRI may be helpful to further characterize.  Chronic ischemic changes.  Original Report Authenticated By: Donavan Burnet, M.D.   Ct Angio Neck W/cm &/or Wo/cm  10/17/2011  *RADIOLOGY REPORT*  Clinical Data:  Diabetic hypertensive patient with renal disorder and coronary artery disease.  Altered mental status.  No new neurological findings otherwise noted. Pacemaker and  cannot have MR.  Left carotid endarterectomy 1991.  CT ANGIOGRAPHY HEAD AND NECK  Technique:  Multidetector CT imaging of the head and neck was performed using the standard protocol during bolus administration of intravenous contrast.  Multiplanar CT image reconstructions including MIPs were obtained to evaluate the vascular anatomy. Carotid stenosis measurements (when applicable) are obtained utilizing NASCET criteria, using the distal internal carotid diameter as the denominator.  Contrast: 50mL OMNIPAQUE IOHEXOL 350 MG/ML SOLN  Comparison:  10/15/2011 head CT.  CTA NECK  Findings:  Pacemaker in place.  Atherosclerotic type changes aortic arch with ectasia.  Normal configuration of the origin of the great vessels from the aortic arch.  Mild narrowing proximal innominate artery.  Mild to moderate narrowing 2 cm above the origin of the innominate artery.  Stent within the proximal right subclavian artery with moderate narrowing.  Mild narrowing proximal right common carotid artery.  Plaque proximal left internal carotid artery with narrowing most notable 1.8 cm above the origin of the left internal carotid artery where there is a shelf-like plaque with focal 62% diameter stenosis.  Ectatic vertical cervical segment of the right internal carotid artery with fold at the C1 level with narrowing.  Calcification with mild narrowing proximal left common carotid artery.  Fold 4 cm beyond its origin with mild narrowing.  Prior left carotid endarterectomy.  Calcification along the endarterectomy site with mild narrowing but without hemodynamically significant stenosis.  Ectatic vertical segment left internal carotid artery.  Moderate to slightly marked narrowing proximal right vertebral artery.  Mild irregularity left subclavian artery without significant narrowing.  The left vertebral artery is dominant.  Fold of the left vertebral artery 2 cm above its origin with plaque and mild narrowing.  Degenerative changes cervical  spine most notable C5-6 and less so C4-5.  Mild spinal stenosis with minimal cord contact at both of these levels.  Small nodules in the thyroid gland largest left lobe measuring 6 mm.  Nonspecific 1 cm low density structure extends from the base of the right palatine tonsil to the epiglottis.   Review of the MIP images confirms the above findings.  IMPRESSION: Stent within the proximal right subclavian artery with moderate narrowing.  Plaque proximal left  internal carotid artery with narrowing most notable 1.8 cm above the origin of the left internal carotid artery where there is a shelf-like plaque with focal 62% diameter stenosis.  Prior left carotid endarterectomy.  Calcification along the endarterectomy site with mild narrowing but without hemodynamically significant stenosis.  Moderate to slightly marked narrowing proximal right vertebral artery.  Please see above for additional findings.  CTA HEAD  Findings:  Since the recent CT examination, there is loss of sulci in the superior right convexity (series 2 images 24 through 26) with mild enhancement suggesting acute to posterior right frontal lobe infarct.  As the patient has a arteriovenous malformation of the anterior left opercular region, one could raise possibility of subarachnoid hemorrhage causing loss of sulci in the right frontal lobe however, there is no subarachnoid hemorrhage surrounding the left frontal opercular arteriovenous malformation and subarachnoid hemorrhage is felt to be a much less likely consideration for the right frontal lobe findings.  Encephalitis also felt unlikely (to be considered if of clinical concern). Tumor felt unlikely.  Prominent tangle of vessels anterior left opercular region consistent with arteriovenous malformation. No adjacent subarachnoid hemorrhage.  Right pontine small infarct appears remote.  Remote right thalamic infarct.  Small vessel disease type changes.  Global atrophy without hydrocephalus.   Atherosclerotic type changes cavernous segment of the internal carotid artery bilaterally with mild to slightly moderate narrowing.  Mild irregularity and narrowing of the M1 segment of the right middle cerebral artery with mild to slightly moderate narrowing right middle cerebral artery bifurcation.  After the takeoff of the right PICA, small right vertebral artery.  Mild narrowing distal left vertebral artery after the takeoff of the left PICA.  Slight irregularity of small caliber basilar artery without high- grade stenosis.   Review of the MIP images confirms the above findings.  IMPRESSION: Acute right frontal lobe infarct suspected as detailed above.  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  Remote infarcts, small vessel disease type changes and intracranial atherosclerotic type changes as detailed above.  Critical Value/emergent results were called by telephone at the time of interpretation on 10/17/2011 at 6:06 p.m. to Dr. Terrace Arabia, who verbally acknowledged these results.  Original Report Authenticated By: Fuller Canada, M.D.   Dg Chest Portable 1 View  11/11/2011  *RADIOLOGY REPORT*  Clinical Data: Hyperglycemia, shortness of breath, weakness, history diabetes, hypertension, coronary artery disease  PORTABLE CHEST - 1 VIEW  Comparison: Portable exam 0345 hours compared to 10/15/2011  Findings: Left subclavian sequential transvenous pacemaker leads project at right atrium and right ventricle. Upper normal heart size. Atherosclerotic calcification aorta. Mediastinal contours and pulmonary vascularity normal. Lungs appear emphysematous with minimal right basilar atelectasis. No definite infiltrate, pleural effusion or pneumothorax. Wall stent identified at the right superior mediastinum. Bones appear demineralized.  IMPRESSION: Question emphysematous changes with right basilar atelectasis.   Original Report Authenticated By: Lollie Marrow, M.D.    Dg Abd Portable  1v  11/11/2011  *RADIOLOGY REPORT*  Clinical Data: Nausea and vomiting.  PORTABLE ABDOMEN - 1 VIEW  Comparison: Plain film the abdomen 04/11/2009.  Findings: There is a very large volume of stool present throughout the colon with a massive stool ball in the rectum.  No evidence of small bowel obstruction is identified.  Renal artery stent on the left is noted. There is also a stent in a right iliac vessel.  The patient is status post lumbar fusion.  IMPRESSION: Marked constipation with a very large stool ball  in the rectum.   Original Report Authenticated By: Bernadene Bell. Maricela Curet, M.D.       Date: 11/11/2011  Rate: 68  Rhythm: normal sinus rhythm  QRS Axis: normal  Intervals: normal  ST/T Wave abnormalities: nonspecific ST changes  Conduction Disutrbances:none  Narrative Interpretation:   Old EKG Reviewed: unchanged  IVFs, calcium for hyperkalemia and IV insulin. Serial evaluations.   Old records reviewed , recent CVA with similar presentation.   4:45 AM d/w MED on call Dr Toniann Fail and plan admit tele, cont IVfs, insulin and will check CT brain   MDM  VS and nursing notes reviewed. IVFs and insulin. MED admit.        Sunnie Nielsen, MD 11/12/11 [phone]

## 2011-11-11 NOTE — Progress Notes (Signed)
Pt CBG 112; Dr.Krishnan made aware, told to take pt off insulin gtt and to give 10 units SQ lantus; will continue to monitor

## 2011-11-11 NOTE — Progress Notes (Signed)
INITIAL ADULT NUTRITION ASSESSMENT Date: 11/11/2011   Time: 2:15 PM  INTERVENTION:  Magic Cup dessert supplement 3 times daily with meals (290 kcals, 9 gm protein per 4 oz cup) RD to follow for nutrition care plan  DOCUMENTATION CODES Per approved criteria  -Severe malnutrition in the context of chronic illness -Underweight   Reason for Assessment: Malnutrition Screening Tool Report  ASSESSMENT: Female 78 y.o.  Dx: Nausea, vomiting and high blood sugar  Hx:  Past Medical History  Diagnosis Date  . Diabetes mellitus   . Hypertension   . Coronary artery disease   . Renal disorder   . Herniated lumbar intervertebral disc   . Cataract     cataract surgery scheduled for 03/31/11, 2 - left eye, 1 - right eye  . Renovascular hypertension      s/p left renal artery stent 12/2007.   S/P balloon angioplasty on 02/16/10 for ISR, BP was  controlled well since then.     . Claudication in peripheral vascular disease     02/16/10: Left CIA 9.0x28 Omnilink and REIA 8.0x40 seff expanding Zilver.   right subclavian artery stent 03/18/2008,  . S/P carotid endarterectomy      left carotid endarterectomy  1991; Stroke and TIA in 1999 with right sided weakness, now with residual right arm weakness  . Stroke     TIA  . Peripheral vascular disease   . Anxiety     Related Meds:     . aspirin  81 mg Oral Daily  . atorvastatin  20 mg Oral q1800  . calcium gluconate  1 g Intravenous Once  . cilostazol  100 mg Oral BID  . hydrALAZINE  50 mg Oral QID  . insulin aspart  10 Units Intravenous Once  . insulin detemir  5 Units Subcutaneous BID  . insulin glargine  10 Units Subcutaneous Once  . iohexol  20 mL Oral Q1 Hr x 2  . isosorbide mononitrate  60 mg Oral Daily  . losartan  50 mg Oral Daily  . megestrol  400 mg Oral Daily  . metoCLOPramide (REGLAN) injection  5 mg Intravenous Q6H  . metoprolol tartrate  25 mg Oral BID  . omega-3 acid ethyl esters  1 g Oral BID WC  . sodium chloride  1,000  mL Intravenous Once  . sodium chloride  3 mL Intravenous Q12H  . DISCONTD: insulin regular  0-10 Units Intravenous TID WC  . DISCONTD: insulin regular  0-10 Units Intravenous TID WC  . DISCONTD: losartan  50 mg Oral Daily    Ht: 5\' 5"  (165.1 cm)  Wt: 98 lb (44.453 kg)  Ideal Wt: 56.8 kg % Ideal Wt: 78%  Usual Wt: 122 lb -- May 2013 % Usual Wt: 80%  Body mass index is 16.31 kg/(m^2).  Food/Nutrition Related Hx: recent weight loss without trying and decreased appetite per admission nutrition screen  Labs:  CMP     Component Value Date/Time   NA 138 11/11/2011 1025   K 3.8 11/11/2011 1025   CL 100 11/11/2011 1025   CO2 29 11/11/2011 1025   GLUCOSE 239* 11/11/2011 1025   BUN 32* 11/11/2011 1025   CREATININE 1.16* 11/11/2011 1025   CALCIUM 10.4 11/11/2011 1025   PROT 6.5 11/11/2011 0700   ALBUMIN 3.4* 11/11/2011 0700   AST 34 11/11/2011 0700   ALT 122* 11/11/2011 0700   ALKPHOS 132* 11/11/2011 0700   BILITOT 0.2* 11/11/2011 0700   GFRNONAA 45* 11/11/2011 1025   GFRAA 52*  11/11/2011 1025    Diet Order: Carb Control  Supplements/Tube Feeding: N/A  IVF:    sodium chloride Last Rate: 125 mL/hr at 11/11/11 3244  DISCONTD: sodium chloride   DISCONTD: sodium chloride   DISCONTD: dextrose 5 % and 0.45% NaCl   DISCONTD: dextrose 5 % and 0.45% NaCl   DISCONTD: insulin (NOVOLIN-R) infusion Last Rate: 5.4 Units/hr (11/11/11 0506)  DISCONTD: insulin (NOVOLIN-R) infusion Last Rate: 3.6 Units/hr (11/11/11 1054)    Estimated Nutritional Needs:   Kcal: 1300-1500 Protein: 60-70 gm Fluid: > 1.5 L  Patient admitted with high blood sugar (more than a thousand); started on IV insulin and aggressive fluid hydration; + generalized weakness; patient with visible subcutaneous muscle & fat loss; states her appetite has been poor; does not care for Ensure supplements; per hospital records, patient has lost 24 lb since end of May 2013 (20%); amenable to trying [employer] supplement -- RD to order.  Patient  meets criteria for severe malnutrition in the context of chronic illness given 20% weight loss x 3 months, severe muscle & subcutaneous fat loss.   NUTRITION DIAGNOSIS: -Inadequate oral intake (NI-2.1).  Status: Ongoing  RELATED TO: poor appetite  AS EVIDENCE BY: no % PO intake documented  MONITORING/EVALUATION(Goals): Goal: Oral intake with meals & supplements to meet >/= 90% of estimated nutrition needs Monitor: PO & supplemental intake, weight, labs, I/O's  EDUCATION NEEDS: -No education needs identified at this time  Dietitian #: [phone]  Alger Memos 11/11/2011, 2:15 PM

## 2011-11-11 NOTE — ED Notes (Signed)
NURSE EXPLAINED PROCESS/DELAY AND HIGH CENSUS TO PT'S FAMILY , ADVISED THEM THAT WE WILL TRY TO MOVE HER TO A ROOM AS SOON AS AVAILABLE.

## 2011-11-11 NOTE — ED Notes (Signed)
Pt reports having decrease in appetite with nausea and an episode of vomiting, denies any diarrhea x 3 days. Pt reports being compliant with insulin but has being drinking "glucerna and I think it has to do with my sugar going up". Pt denies any pain but has generalized weakness and not feeling well x 3 days, has frequent urine flow denies any difficulty. Pt and family reports pt has sudden increase in blood sugar "just tonight, it was fine earlier today".

## 2011-11-11 NOTE — ED Notes (Signed)
PT. REPORTS ELEVATED BLOOD SUGAR AT HOME THIS EVENING = READS "HIGH" ON GLUCOSE METER , STATES VOMITTING WITH GENERALIZED WEAKNESS/ POOR APPETITE, TOOK LEVIMIR INSULIN SQ 5 UNITS THIS EVENING .

## 2011-11-11 NOTE — H&P (Signed)
Carmen [name] is an 78 y.o. female.  Patient was seen and examined on November 11, 2011 at 5:10 AM. PCP - Dr. Dorothyann Peng. Chief Complaint: Nausea vomiting and high blood sugar. HPI: 78 year old female with history of diabetes mellitus type 2 on insulin who was admitted last month with hyperosmolar state and possible CVA presents with complaint of nausea vomiting unable to keep anything last 3 days. Patient states she did not miss her levemir dose. Since her sugars were high and persistent nausea she came to the ER. Over in the ER blood sugar was found to be more than thousand but patient was not in DKA. Patient has been started IV insulin and aggressive fluid hydration. Patient still has nausea but no abdominal pain. On exam patient abdomen appears benign. Denies any chest pain or shortness of breath. Does have generalized weakness.  Past Medical History  Diagnosis Date  . Diabetes mellitus   . Hypertension   . Coronary artery disease   . Renal disorder   . Herniated lumbar intervertebral disc   . Cataract     cataract surgery scheduled for 03/31/11, 2 - left eye, 1 - right eye  . Renovascular hypertension      s/p left renal artery stent 12/2007.   S/P balloon angioplasty on 02/16/10 for ISR, BP was  controlled well since then.     . Claudication in peripheral vascular disease     02/16/10: Left CIA 9.0x28 Omnilink and REIA 8.0x40 seff expanding Zilver.   right subclavian artery stent 03/18/2008,  . S/P carotid endarterectomy      left carotid endarterectomy  1991; Stroke and TIA in 1999 with right sided weakness, now with residual right arm weakness  . Stroke     TIA  . Peripheral vascular disease   . Anxiety     Past Surgical History  Procedure Date  . Appendectomy   . Abdominal hysterectomy   . Ureteral stent placement   . Carotid endarterectomy   . Laminectomy   . Back surgery   . Colonoscopy 07/30/2011    Procedure: COLONOSCOPY;  Surgeon: Louis Meckel, MD;  Location[facility]  ENDOSCOPY;  Service: Endoscopy;  Laterality: N/A;  gi bleed  . Esophagogastroduodenoscopy 07/30/2011    Procedure: ESOPHAGOGASTRODUODENOSCOPY (EGD);  Surgeon: Louis Meckel, MD;  Location: [facility] ENDOSCOPY;  Service: Endoscopy;  Laterality: N/A;  . Pacemaker insertion     History reviewed. No pertinent family history. Social History:  reports that she has been smoking Cigarettes.  She has a 32.5 pack-year smoking history. She has never used smokeless tobacco. She reports that she does not drink alcohol or use illicit drugs.  Allergies:  Allergies  Allergen Reactions  . Norvasc (Amlodipine Besylate) Swelling  . Peanut-Containing Drug Products Other (See Comments)    Cause my stomach to hurt     (Not in a hospital admission)  Results for orders placed during the hospital encounter of 11/11/11 (from the past 48 hour(s))  GLUCOSE, CAPILLARY     Status: Abnormal   Collection Time   11/11/11 12:39 AM      Component Value Range Comment   Glucose-Capillary >600 (*) 70 - 99 mg/dL    Comment 1 Notify RN     CBC WITH DIFFERENTIAL     Status: Abnormal   Collection Time   11/11/11  1:15 AM      Component Value Range Comment   WBC 5.5  4.0 - 10.5 K/uL    RBC 5.34 (*) 3.87 -  5.11 MIL/uL    Hemoglobin 18.0 (*) 12.0 - 15.0 g/dL    HCT 04.5 (*) 40.9 - 46.0 %    MCV 97.4  78.0 - 100.0 fL    MCH 33.7  26.0 - 34.0 pg    MCHC 34.6  30.0 - 36.0 g/dL    RDW 81.1  91.4 - 78.2 %    Platelets 154  150 - 400 K/uL    Neutrophils Relative 69  43 - 77 %    Neutro Abs 3.7  1.7 - 7.7 K/uL    Lymphocytes Relative 26  12 - 46 %    Lymphs Abs 1.4  0.7 - 4.0 K/uL    Monocytes Relative 4  3 - 12 %    Monocytes Absolute 0.2  0.1 - 1.0 K/uL    Eosinophils Relative 1  0 - 5 %    Eosinophils Absolute 0.1  0.0 - 0.7 K/uL    Basophils Relative 0  0 - 1 %    Basophils Absolute 0.0  0.0 - 0.1 K/uL   BASIC METABOLIC PANEL     Status: Abnormal   Collection Time   11/11/11  1:15 AM      Component Value Range Comment    Sodium 126 (*) 135 - 145 mEq/L    Potassium 6.0 (*) 3.5 - 5.1 mEq/L    Chloride 84 (*) 96 - 112 mEq/L    CO2 33 (*) 19 - 32 mEq/L    Glucose, Bld 1152 (*) 70 - 99 mg/dL    BUN 40 (*) 6 - 23 mg/dL    Creatinine, Ser 9.56 (*) 0.50 - 1.10 mg/dL    Calcium 21.3 (*) 8.4 - 10.5 mg/dL    GFR calc non Af Amer 30 (*) >90 mL/min    GFR calc Af Amer 34 (*) >90 mL/min   URINALYSIS, ROUTINE W REFLEX MICROSCOPIC     Status: Abnormal   Collection Time   11/11/11  2:17 AM      Component Value Range Comment   Color, Urine YELLOW  YELLOW    APPearance CLEAR  CLEAR    Specific Gravity, Urine 1.026  1.005 - 1.030    pH 6.0  5.0 - 8.0    Glucose, UA >1000 (*) NEGATIVE mg/dL    Hgb urine dipstick NEGATIVE  NEGATIVE    Bilirubin Urine NEGATIVE  NEGATIVE    Ketones, ur NEGATIVE  NEGATIVE mg/dL    Protein, ur 30 (*) NEGATIVE mg/dL    Urobilinogen, UA 0.2  0.0 - 1.0 mg/dL    Nitrite NEGATIVE  NEGATIVE    Leukocytes, UA SMALL (*) NEGATIVE   URINE MICROSCOPIC-ADD ON     Status: Normal   Collection Time   11/11/11  2:17 AM      Component Value Range Comment   Squamous Epithelial / LPF RARE  RARE    WBC, UA 11-20  <3 WBC/hpf    Bacteria, UA RARE  RARE   COMPREHENSIVE METABOLIC PANEL     Status: Abnormal   Collection Time   11/11/11  3:27 AM      Component Value Range Comment   Sodium 127 (*) 135 - 145 mEq/L    Potassium 5.2 (*) 3.5 - 5.1 mEq/L    Chloride 89 (*) 96 - 112 mEq/L    CO2 30  19 - 32 mEq/L    Glucose, Bld 1002 (*) 70 - 99 mg/dL    BUN 37 (*) 6 - 23 mg/dL  Creatinine, Ser 1.36 (*) 0.50 - 1.10 mg/dL    Calcium 56.2  8.4 - 10.5 mg/dL    Total Protein 7.1  6.0 - 8.3 g/dL    Albumin 3.8  3.5 - 5.2 g/dL    AST 37  0 - 37 U/L    ALT 135 (*) 0 - 35 U/L    Alkaline Phosphatase 144 (*) 39 - 117 U/L    Total Bilirubin 0.2 (*) 0.3 - 1.2 mg/dL    GFR calc non Af Amer 37 (*) >90 mL/min    GFR calc Af Amer 43 (*) >90 mL/min   GLUCOSE, CAPILLARY     Status: Abnormal   Collection Time   11/11/11  5:02  AM      Component Value Range Comment   Glucose-Capillary >600 (*) 70 - 99 mg/dL    Comment 1 Documented in Chart      Comment 2 Notify RN      Dg Chest Portable 1 View  11/11/2011  *RADIOLOGY REPORT*  Clinical Data: Hyperglycemia, shortness of breath, weakness, history diabetes, hypertension, coronary artery disease  PORTABLE CHEST - 1 VIEW  Comparison: Portable exam 0345 hours compared to 10/15/2011  Findings: Left subclavian sequential transvenous pacemaker leads project at right atrium and right ventricle. Upper normal heart size. Atherosclerotic calcification aorta. Mediastinal contours and pulmonary vascularity normal. Lungs appear emphysematous with minimal right basilar atelectasis. No definite infiltrate, pleural effusion or pneumothorax. Wall stent identified at the right superior mediastinum. Bones appear demineralized.  IMPRESSION: Question emphysematous changes with right basilar atelectasis.   Original Report Authenticated By: Lollie Marrow, M.D.     Review of Systems  HENT: Negative.   Eyes: Negative.   Respiratory: Negative.   Cardiovascular: Negative.   Gastrointestinal: Positive for nausea and vomiting.  Genitourinary: Negative.   Musculoskeletal: Negative.   Skin: Negative.   Neurological: Positive for weakness.  Endo/Heme/Allergies: Negative.   Psychiatric/Behavioral: Negative.     Blood pressure 128/55, pulse 74, temperature 98.3 F (36.8 C), temperature source Oral, resp. rate 17, SpO2 95.00%. Physical Exam  Constitutional: She is oriented to person, place, and time. She appears well-developed and well-nourished. No distress.  HENT:  Head: Normocephalic and atraumatic.  Right Ear: External ear normal.  Left Ear: External ear normal.  Eyes: Conjunctivae are normal. Pupils are equal, round, and reactive to light. Right eye exhibits no discharge. Left eye exhibits no discharge. No scleral icterus.  Neck: Normal range of motion. Neck supple.  Cardiovascular: Normal  rate and regular rhythm.   Respiratory: Effort normal and breath sounds normal. No respiratory distress. She has no wheezes. She has no rales.  GI: Soft. Bowel sounds are normal. She exhibits no distension. There is no tenderness. There is no rebound.  Musculoskeletal: Normal range of motion. She exhibits no edema and no tenderness.  Neurological: She is alert and oriented to person, place, and time.       Moves all extremities.  Skin: Skin is warm and dry. She is not diaphoretic.     Assessment/Plan #1. Uncontrolled diabetes mellitus with hyperosmolar state - continue IV insulin infusion and aggressive IV fluid hydration until blood sugars decreases below 250 at that point change to long-acting subcutaneous insulin. #2. Nausea and vomiting - abdomen appears benign. Recheck LFTs. Probably secondary to diabetic gastroparesis. Or viral gastroenteritis. #3. Acute renal failure with hypernatremia and hyperkalemia - patient's sodium and potassium are improving with hydration. Renal failure is also from dehydration. Recheck metabolic panel after hydration. #4.  Peripheral vascular disease and renal artery stent placement. #5. Pacemaker placement. #6. History of hyperlipidemia.  CODE STATUS - full code.  Emanuelle Hammerstrom N. 11/11/2011, 5:35 AM

## 2011-11-11 NOTE — ED Notes (Signed)
Pt gone to CT. Informed transporter to notify CT to call this RN before returning to department so pt could be transported upstairs.

## 2011-11-11 NOTE — Care Management Note (Signed)
    Page 1 of 2   11/12/2011     12:24:36 PM   CARE MANAGEMENT NOTE 11/12/2011  Patient:  Carmen [name], Carmen [name]   Account Number:  [id number]  Date Initiated:  11/11/2011  Documentation initiated by:  GRAVES-BIGELOW,Bob Eastwood  Subjective/Objective Assessment:   Pt admitted with N/V and increased blood sugar of 1152. Pt placed on Iv insulin gtt.     Action/Plan:   CM will continue ot monitor for disposition needs. Unsure if pt can manage at home ith husband. Pt will need PT/OT for disposition recommendaitons. If plan for home pt will need HHRN for medication and disease management.   Anticipated DC Date:  11/15/2011   Anticipated DC Plan:  SKILLED NURSING FACILITY      DC Planning Services  CM consult      [facility]  HOME HEALTH  Resumption Of Svcs/PTA Provider   Choice offered to / List presented to:  [name]        HH arranged  HH-1 RN  HH-10 DISEASE MANAGEMENT  HH-2 PT      HH agency  [facility]   Status of service:  Completed, signed off Medicare Important Message given?   (If response is "NO", the following Medicare IM given date fields will be blank) Date Medicare IM given:   Date Additional Medicare IM given:    Discharge Disposition:  HOME W HOME HEALTH SERVICES  Per UR Regulation:  Reviewed for med. necessity/level of care/duration of stay  If discussed at Long Length of Stay Meetings, dates discussed:    Comments:  11-12-11 1222 Tomi Bamberger, [place] [phone] CM spoke to pt and she stated that she is active with [facility] for PT services. MD will resume and order [facility] for medication /disease management assistance. Pt admitted with elevated blood sugars and MD is tweaking meds at this time. Pt states she is from home with husband. She has family support and they will help to monitor compliance with meds. SOC to begin within 24-48 hours post d/c.   11-11-11 1713 Tomi Bamberger, RN, BSN [phone] CM will continue to f/u in am.

## 2011-11-11 NOTE — ED Notes (Signed)
Admitting physician at the bedside.

## 2011-11-11 NOTE — ED Notes (Signed)
Please call Mickle Mallory [phone]

## 2011-11-11 NOTE — Progress Notes (Signed)
[facility] PROGRESS NOTE  Carmen [name] QMV:[id number] DOB: 10-Nov-1933 DOA: 11/11/2011 PCP: Gwynneth Aliment, MD  Assessment/Plan: Active Problems:  HTN (hypertension), malignant: Blood pressure medications on hold due to dehydration.   Tobacco abuse: Patient continues to smoke.   DM (diabetes mellitus), type 2: Able to wean off of drip. Has started subcutaneous Lantus. Checking A1c. There may be another underlying cause which may be leading to disruption of sugars.   Peripheral neuropathy  Claudication in peripheral vascular disease status post stent to common iliac artery and 2011 and right subclavian artery stent 2010: Continue aspirin and Pletal. Patient advised not to smoke.   Cardiac pacemaker for history of intermittent heart block: Status post pacemaker   Pacemaker-[employer] secondary to heart block.  Unintentional weight loss: Checking to see about ischemic bowel versus gastroparesis. Start Reglan and Megace. It may need outpatient GI endoscopy.   Diabetic hyperosmolar non-ketotic state: Result. See above.   Nausea and vomiting: Difficult to say this episode from viral gastroenteritis versus other. Given her poor by mouth intake this could be gastroparesis although the possibility given her PVD could also be ischemic bowel. Have ordered CT scan.   ARF (acute renal failure): Resolved with IV fluids, back to stage I chronic kidney disease at baseline.  Code Status: Full Family Communication: Plan discussed with daughters, all of which are present at bedside Disposition Plan: Discharge home in a few days   Brief narrative: 78 year old Afro-American female past medical history diabetes mellitus, peripheral vascular disease and CVA who presents with nonketotic hyperglycemia after episodes of nausea and vomiting.  Consultants:  None  Procedures:  None  Antibiotics:  None  HPI/Subjective: Patient is very tired. She complains of not having much appetite  for the past few months. She states that the nausea and vomiting is more new. She placed some generalized nonspecific belly pain.  Objective: Filed Vitals:   11/11/11 0415 11/11/11 0445 11/11/11 0619 11/11/11 1003  BP: 155/68 128/55 169/69 146/58  Pulse: 71 74 64 65  Temp:   98.3 F (36.8 C)   TempSrc:   Oral   Resp: 18 17    Height:   5\' 5"  (1.651 m)   Weight:   44.453 kg (98 lb)   SpO2: 97% 95% 95%    No intake or output data in the 24 hours ending 11/11/11 1327 Filed Weights   11/11/11 2841  Weight: 44.453 kg (98 lb)-according to family patient has lost about 40 pounds in the last 3-4 months     Exam:   General:  Alert and oriented x2, fatigued, looks about stated age  HEENT: Normocephalic, atraumatic, mucous membranes are quite dry  Cardiovascular: Regular rate and rhythm, S1-S2  Respiratory: Clear to auscultation bilaterally  Abdomen: Soft, generalized nonspecific tenderness, few areas of bowel sounds, nondistended  Data Reviewed: Basic Metabolic Panel:  Lab 11/11/11 3244 11/11/11 0700 11/11/11 0327 11/11/11 0115  NA 138 131* 127* 126*  K 3.8 4.4 5.2* 6.0*  CL 100 95* 89* 84*  CO2 29 23 30  33*  GLUCOSE 239* 584* 1002* 1152*  BUN 32* 35* 37* 40*  CREATININE 1.16* 1.31* 1.36* 1.62*  CALCIUM 10.4 10.2 10.2 10.6*  MG -- -- -- --  PHOS -- -- -- --   Liver Function Tests:  Lab 11/11/11 0700 11/11/11 0327  AST 34 37  ALT 122* 135*  ALKPHOS 132* 144*  BILITOT 0.2* 0.2*  PROT 6.5 7.1  ALBUMIN 3.4* 3.8   CBC:  Lab 11/11/11 0700  11/11/11 0115  WBC 5.6 5.5  NEUTROABS 3.6 3.7  HGB 16.5* 18.0*  HCT 46.1* 52.0*  MCV 92.4 97.4  PLT 149* 154   Cardiac Enzymes:  Lab 11/11/11 0700  CKTOTAL --  CKMB --  CKMBINDEX --  TROPONINI <0.30   CBG:  Lab 11/11/11 1155 11/11/11 1051 11/11/11 0940 11/11/11 0831 11/11/11 0725  GLUCAP 112* 179* 280* 396* 553*     Studies:   Ct Head Wo Contrast  11/11/2011    IMPRESSION: Atrophy with mild small vessel chronic  ischemic changes of deep cerebral white matter. Old lacunar infarct at right pons. Known AVM anterior to left basal ganglia. No acute intracranial abnormalities.   Original Report Authenticated By: Lollie Marrow, M.D.     Dg Chest Portable 1 View  11/11/2011    IMPRESSION: Question emphysematous changes with right basilar atelectasis.   Original Report Authenticated By: Lollie Marrow, M.D.     Scheduled Meds:   . aspirin  81 mg Oral Daily  . atorvastatin  20 mg Oral q1800  . calcium gluconate  1 g Intravenous Once  . cilostazol  100 mg Oral BID  . hydrALAZINE  50 mg Oral QID  . insulin aspart  10 Units Intravenous Once  . insulin detemir  5 Units Subcutaneous BID  . insulin glargine  10 Units Subcutaneous Once  . isosorbide mononitrate  60 mg Oral Daily  . losartan  50 mg Oral Daily  . megestrol  400 mg Oral Daily  . metoCLOPramide (REGLAN) injection  5 mg Intravenous Q6H  . metoprolol tartrate  25 mg Oral BID  . omega-3 acid ethyl esters  1 g Oral BID WC  . sodium chloride  1,000 mL Intravenous Once  . sodium chloride  3 mL Intravenous Q12H  . DISCONTD: insulin regular  0-10 Units Intravenous TID WC  . DISCONTD: insulin regular  0-10 Units Intravenous TID WC  . DISCONTD: losartan  50 mg Oral Daily   Continuous Infusions:   . sodium chloride 125 mL/hr at 11/11/11 0621  . DISCONTD: sodium chloride    . DISCONTD: sodium chloride    . DISCONTD: dextrose 5 % and 0.45% NaCl    . DISCONTD: dextrose 5 % and 0.45% NaCl    . DISCONTD: insulin (NOVOLIN-R) infusion 5.4 Units/hr (11/11/11 0506)  . DISCONTD: insulin (NOVOLIN-R) infusion 3.6 Units/hr (11/11/11 1054)      Time spent: 40 minutes    Hollice Espy  [facility] Pager [phone]. If 8PM-8AM, please contact night-coverage at [email or web address], password [facility] 11/11/2011, 1:27 PM  LOS: 0 days

## 2011-11-11 NOTE — Progress Notes (Signed)
UR Completed Duchess Armendarez Graves-Bigelow, RN,BSN [phone]  

## 2011-11-11 NOTE — Progress Notes (Signed)
Patient admitted with extremely high blood sugars (lab 1152 mg/dl on admit).  Supposed to be taking Levemir 5 units bid at home.    Upon interview, I found the patient extremely weak and drowsy.  Patient was able to tell me that she lives with her husband and takes care of herself.  Patient told me she is able to give herself her own insulin injections.  Upon my assessment, I am concerned that patient may not be able to care for herself at home.  Alerted care management about this.  Spoke with patient's daughter who was in the room.  Patient's daughter told me patient sometimes has difficulty affording all of her medications.  Her husband is on a lot of medications too, and the daughter told me she was worried that her mother may not be buying certain medications b/c of cost.  Patient's daughter also told me that her mother was previously on insulin pens, but was switched to insulin vials during last admission.  Patient's daughter told me that she thinks the insulin pens were more affordable for her mother.  A1c from last admit showed extremely poor control (12.2% from 10/15/11).  I am not entirely convinced patient is able to fully care for herself at home.  She appears malnourished and weak.  She may need more insulin at home, especially at mealtimes.  Patient may need to have a rapid- acting insulin to cover her food.  However, cost may be an issue.  We could switch her to 70/30 insulin, which can be purchased at [facility] for $24.88 per vial with a physician's prescription, however, if her appetite remains poor, 70/30 insulin is not a great option for home (she needs to eat 3 full meals a day to take 70/30 insulin bid at home).  Will continue to follow and assess her insulin requirements. Ambrose Finland RN, MSN, CDE Diabetes Coordinator Inpatient Diabetes Program [phone]

## 2011-11-12 DIAGNOSIS — K3184 Gastroparesis: Secondary | ICD-10-CM | POA: Diagnosis present

## 2011-11-12 DIAGNOSIS — F17200 Nicotine dependence, unspecified, uncomplicated: Secondary | ICD-10-CM

## 2011-11-12 DIAGNOSIS — K5900 Constipation, unspecified: Secondary | ICD-10-CM | POA: Diagnosis present

## 2011-11-12 LAB — GLUCOSE, CAPILLARY
Glucose-Capillary: 139 mg/dL — ABNORMAL HIGH (ref 70–99)
Glucose-Capillary: 198 mg/dL — ABNORMAL HIGH (ref 70–99)
Glucose-Capillary: 235 mg/dL — ABNORMAL HIGH (ref 70–99)
Glucose-Capillary: 353 mg/dL — ABNORMAL HIGH (ref 70–99)

## 2011-11-12 LAB — HEMOGLOBIN A1C: Mean Plasma Glucose: 418 mg/dL — ABNORMAL HIGH (ref <117)

## 2011-11-12 MED ORDER — INSULIN ASPART 100 UNIT/ML ~~LOC~~ SOLN
0.0000 [IU] | SUBCUTANEOUS | Status: DC
  Administered 2011-11-12: 7 [IU] via SUBCUTANEOUS
  Administered 2011-11-12 (×2): 20 [IU] via SUBCUTANEOUS
  Administered 2011-11-13: 2 [IU] via SUBCUTANEOUS

## 2011-11-12 MED ORDER — FLEET ENEMA 7-19 GM/118ML RE ENEM
1.0000 | ENEMA | Freq: Once | RECTAL | Status: DC
  Filled 2011-11-12: qty 1

## 2011-11-12 MED ORDER — INSULIN DETEMIR 100 UNIT/ML ~~LOC~~ SOLN
15.0000 [IU] | Freq: Two times a day (BID) | SUBCUTANEOUS | Status: DC
  Administered 2011-11-12 (×2): 15 [IU] via SUBCUTANEOUS
  Administered 2011-11-13: 10 [IU] via SUBCUTANEOUS
  Filled 2011-11-12 (×2): qty 10

## 2011-11-12 MED ORDER — DOCUSATE SODIUM 50 MG/5ML PO LIQD
200.0000 mg | Freq: Two times a day (BID) | ORAL | Status: DC
  Filled 2011-11-12 (×2): qty 20

## 2011-11-12 MED ORDER — METOCLOPRAMIDE HCL 5 MG PO TABS
5.0000 mg | ORAL_TABLET | Freq: Three times a day (TID) | ORAL | Status: DC
  Administered 2011-11-12 – 2011-11-13 (×4): 5 mg via ORAL
  Filled 2011-11-12 (×8): qty 1

## 2011-11-12 NOTE — Progress Notes (Signed)
Spoke with MD, said ok to restart hydralzine but to still hold on the pts imdur; pts BP 137/58

## 2011-11-12 NOTE — Clinical Documentation Improvement (Signed)
Abnormal Labs Clarification  THIS DOCUMENT IS NOT A PERMANENT PART OF THE MEDICAL RECORD  TO RESPOND TO THE THIS QUERY, FOLLOW THE INSTRUCTIONS BELOW:  1. If needed, update documentation for the patient's encounter via the notes activity.  2. Access this query again and click edit on the [employer].  3. After updating, or not, click F2 to complete all highlighted (required) fields concerning your review. Select "additional documentation in the medical record" OR "no additional documentation provided".  4. Click Sign note button.  5. The deficiency will fall out of your InBasket *Please let us know if you are not able to complete this workflow by phone or e-mail (listed below).  Please update your documentation within the medical record to reflect your response to this query.                                                                                   11/12/11  Dear Dr.  Rito Ehrlich   Marton Redwood  In a better effort to capture your patient's severity of illness/SOI, risk of mortality/ROM, reflect appropriate length of stay and utilization of resources, a review of the medical record has revealed the following indicators.    Abnormal findings (laboratory, x-ray, pathologic, and other diagnostic results) are not coded and reported unless the physician indicates their clinical significance.   The medical record reflects the following clinical findings per Registered Dietician on 9/5.  IF YOU AGREE PLEASE DOCUMENT IN NOTES AND DC SUMMARY.   Possible Clinical Conditions?                                 -  Severe Protein Calorie Malnutrition -  Other condition -  Cannot clinically determine  Supporting Information: -  Per Registered Dietician evaluation completed 9/5: "Patient meets criteria for Severe Malnutrition in the context of chronic illness given 20% weight loss x 3 months, severe muscle & subcutaneous fat loss. -  BMI 16.31 -  N/V possible DM gastroparesis vs viral  gastroenteritis    Reviewed:  no additional documentation provided   Thank You,  Beverley Fiedler RN Clinical Documentation Specialist: Pager:  [phone] Health Information Management:  [phone] [facility]

## 2011-11-12 NOTE — Progress Notes (Signed)
pts CBG 460, MD made aware; told to give 20 units novolog and increased levemir 15units; will continue to monitor

## 2011-11-12 NOTE — Progress Notes (Signed)
Results for Carmen [name], Carmen [name] (MRN [id number]) as of 11/12/2011 09:52  Ref. Range 11/11/2011 15:28  Hemoglobin A1C Latest Range: <5.7 % 16.2 (H)   Patient obviously not properly caring for herself or her diabetes at home based on her A1c of 16.2%.  Noted patient yesterday was weak and appeared malnourished.  Noted care management following patient now.  Patient received 10 units Lantus yesterday around 12pm.  Fasting sugar this morning was 139 mg/dl.  Patient switched back to her home Levemir dose today 5 units bid which is equivalent to 10 units Lantus total.  Based on this observation so far, it appears as if patient just isn't taking her insulin period, although she does not admit to noncompliance.  I question whether patient needs assistance at home with her medications.  Patient's daughter was concerned about the cost of patient's insulin.  Daughter thinks that the insulin pens were cheaper for the patient.  Patient was using insulin pens but was switched to vial and syringe last admission.  We could switch patient to 70/30 insulin for finances, however, if patient does not have a good appetite at home 70/30 insulin would not be a good option.  Will continue to follow. Ambrose Finland RN, MSN, CDE Diabetes Coordinator Inpatient Diabetes Program [phone]

## 2011-11-12 NOTE — Progress Notes (Signed)
[facility] PROGRESS NOTE  Carmen [name] EAV:[id number] DOB: 06-03-1933 DOA: 11/11/2011 PCP: Gwynneth Aliment, MD  Assessment/Plan: Active Problems:  HTN (hypertension), malignant: Pressure medications were initially on hold due to dehydration. Initially then started back her beta blocker and ACE inhibitor and as her pressures are started to continue to trend up, and presumed hydralazine. We'll restart her Imdur for pressures continue stay elevated.   Tobacco abuse: Patient continues to smoke.   DM (diabetes mellitus), type 2: Able to wean off of drip. Received initially Lantus and started back on Levemir.. Patient's A1c was markedly elevated at greater than 16. Suspect some of the cause is uncontrolled diabetes plus she likely has been having some low-lying translocation of the gut from stool (see below) which has led to keeping her sugars elevated. Nevertheless clearly she's not well-controlled on Levemir 5 units twice a day so have increased this to 15 units twice a day.   Peripheral neuropathy  Claudication in peripheral vascular disease status post stent to common iliac artery and 2011 and right subclavian artery stent 2010: Continue aspirin and Pletal. Patient advised not to smoke.   Cardiac pacemaker for history of intermittent heart block: Status post pacemaker   Pacemaker-[employer] secondary to heart block.  Unintentional weight loss: Patient is at very very poor intake for the past few months. This is likely from chronic constipation brought on by gastroparesis. With Reglan and aggressive bowel regimen, patient has had significant bowel movements appetite has already improved.  Gastroparesis/constipation: See above. Now the patient is being cleaned out, will recommend starting her on a daily bowel regimen that will keep her regular. Patient hopefully prevent future episodes.   Diabetic hyperosmolar non-ketotic state: Result. See above.   Nausea and vomiting: Most of  this is from chronic constipation.   ARF (acute renal failure): Resolved with IV fluids, back to stage I chronic kidney disease at baseline.  Code Status: Full Family Communication: Plan discussed with daughters, all of which are present at bedside Disposition Plan: Discharge home tomorrow.   Brief narrative: 78 year old Afro-American female past medical history diabetes mellitus, peripheral vascular disease and CVA who presents with nonketotic hyperglycemia after episodes of nausea and vomiting.  Consultants:  None  Procedures:  None  Antibiotics:  None  HPI/Subjective: Patient feeling much better today. She is more awake. She is hungry. Denies any belly pain. She had a large bowel movement with one dose of MiraLAX.  Objective: Filed Vitals:   11/11/11 2145 11/11/11 2150 11/12/11 0605 11/12/11 0620  BP: 104/40  87/49 102/42  Pulse:  74 67   Temp:   98.7 F (37.1 C)   TempSrc:   Oral   Resp:   16   Height:      Weight:   54.931 kg (121 lb 1.6 oz)   SpO2:   94%     Intake/Output Summary (Last 24 hours) at 11/12/11 0913 Last data filed at 11/11/11 1649  Gross per 24 hour  Intake    120 ml  Output      0 ml  Net    120 ml   Filed Weights   11/11/11 0619  Weight: 44.453 kg (98 lb)-according to family patient has lost about 40 pounds in the last 3-4 months     Exam:   General:  Alert and oriented x2, fatigued, looks about stated age  HEENT: Normocephalic, atraumatic, mucous membranes are mildly dry  Cardiovascular: Regular rate and rhythm, S1-S2  Respiratory: Clear to auscultation bilaterally  Abdomen: Soft, nontender, increasing bowel sounds, nondistended  Data Reviewed: Basic Metabolic Panel:  Lab 11/11/11 5621 11/11/11 0700 11/11/11 0327 11/11/11 0115  NA 138 131* 127* 126*  K 3.8 4.4 5.2* 6.0*  CL 100 95* 89* 84*  CO2 29 23 30  33*  GLUCOSE 239* 584* 1002* 1152*  BUN 32* 35* 37* 40*  CREATININE 1.16* 1.31* 1.36* 1.62*  CALCIUM 10.4 10.2 10.2  10.6*  MG -- -- -- --  PHOS -- -- -- --   Liver Function Tests:  Lab 11/11/11 0700 11/11/11 0327  AST 34 37  ALT 122* 135*  ALKPHOS 132* 144*  BILITOT 0.2* 0.2*  PROT 6.5 7.1  ALBUMIN 3.4* 3.8   CBC:  Lab 11/11/11 0700 11/11/11 0115  WBC 5.6 5.5  NEUTROABS 3.6 3.7  HGB 16.5* 18.0*  HCT 46.1* 52.0*  MCV 92.4 97.4  PLT 149* 154   Cardiac Enzymes:  Lab 11/11/11 0700  CKTOTAL --  CKMB --  CKMBINDEX --  TROPONINI <0.30   CBG:  Lab 11/12/11 0734 11/12/11 0438 11/12/11 0013 11/11/11 1947 11/11/11 1606  GLUCAP 139* 152* 198* 243* 122*     Studies:   Ct Head Wo Contrast  11/11/2011    IMPRESSION: Atrophy with mild small vessel chronic ischemic changes of deep cerebral white matter. Old lacunar infarct at right pons. Known AVM anterior to left basal ganglia. No acute intracranial abnormalities.   Original Report Authenticated By: Lollie Marrow, M.D.     Dg Chest Portable 1 View  11/11/2011    IMPRESSION: Question emphysematous changes with right basilar atelectasis.   Original Report Authenticated By: Lollie Marrow, M.D.     Scheduled Meds:    . aspirin  81 mg Oral Daily  . atorvastatin  20 mg Oral q1800  . cilostazol  100 mg Oral BID  . docusate  200 mg Oral BID  . hydrALAZINE  50 mg Oral QID  . insulin aspart  0-9 Units Subcutaneous Q4H  . insulin detemir  5 Units Subcutaneous BID  . insulin glargine  10 Units Subcutaneous Once  . iohexol  20 mL Oral Q1 Hr x 2  . isosorbide mononitrate  60 mg Oral Daily  . losartan  50 mg Oral Daily  . megestrol  400 mg Oral Daily  . metoCLOPramide (REGLAN) injection  5 mg Intravenous Q6H  . metoprolol tartrate  25 mg Oral BID  . omega-3 acid ethyl esters  1 g Oral BID WC  . polyethylene glycol  17 g Oral BID  . sodium chloride  3 mL Intravenous Q12H  . sodium phosphate  1 enema Rectal Once  . DISCONTD: insulin regular  0-10 Units Intravenous TID WC   Continuous Infusions:    . sodium chloride 125 mL/hr at 11/11/11  1912  . DISCONTD: dextrose 5 % and 0.45% NaCl    . DISCONTD: insulin (NOVOLIN-R) infusion 3.6 Units/hr (11/11/11 1054)      Time spent: 40 minutes    Hollice Espy  [facility] Pager [phone]. If 8PM-8AM, please contact night-coverage at [email or web address], password [facility] 11/12/2011, 9:13 AM  LOS: 1 day

## 2011-11-12 NOTE — Progress Notes (Signed)
Spoke with Dr.krishnan about BP meds; pt received all meds yesterday and BP dropped to 90/30s, pt asymptomatic; per Dr.Kraishanan said to hold on hydralazine and imdur; will continue to monitor

## 2011-11-13 DIAGNOSIS — K3184 Gastroparesis: Secondary | ICD-10-CM

## 2011-11-13 DIAGNOSIS — I798 Other disorders of arteries, arterioles and capillaries in diseases classified elsewhere: Secondary | ICD-10-CM

## 2011-11-13 DIAGNOSIS — K5900 Constipation, unspecified: Secondary | ICD-10-CM

## 2011-11-13 DIAGNOSIS — E1159 Type 2 diabetes mellitus with other circulatory complications: Secondary | ICD-10-CM

## 2011-11-13 LAB — GLUCOSE, CAPILLARY
Glucose-Capillary: 49 mg/dL — ABNORMAL LOW (ref 70–99)
Glucose-Capillary: 78 mg/dL (ref 70–99)
Glucose-Capillary: 92 mg/dL (ref 70–99)

## 2011-11-13 MED ORDER — DOCUSATE SODIUM 100 MG PO CAPS: 100.0000 mg | ORAL_CAPSULE | Freq: Two times a day (BID) | ORAL | Status: AC

## 2011-11-13 MED ORDER — FUROSEMIDE 40 MG PO TABS: 20.0000 mg | ORAL_TABLET | Freq: Every day | ORAL | Status: DC

## 2011-11-13 MED ORDER — METOCLOPRAMIDE HCL 5 MG PO TABS: 5.0000 mg | ORAL_TABLET | Freq: Three times a day (TID) | ORAL | Status: DC

## 2011-11-13 MED ORDER — INSULIN DETEMIR 100 UNIT/ML ~~LOC~~ SOLN: 10.0000 [IU] | Freq: Two times a day (BID) | SUBCUTANEOUS | Status: DC

## 2011-11-13 MED ORDER — POTASSIUM CHLORIDE CRYS ER 20 MEQ PO TBCR: 20.0000 meq | EXTENDED_RELEASE_TABLET | Freq: Every day | ORAL | Status: DC

## 2011-11-13 MED ORDER — POLYETHYLENE GLYCOL 3350 17 G PO PACK: 17.0000 g | PACK | Freq: Every day | ORAL | Status: AC | PRN

## 2011-11-13 NOTE — Progress Notes (Signed)
   CARE MANAGEMENT NOTE 11/13/2011  Patient:  Carmen [name], Carmen [name]   Account Number:  [id number]  Date Initiated:  11/11/2011  Documentation initiated by:  GRAVES-BIGELOW,BRENDA  Subjective/Objective Assessment:   Pt admitted with N/V and increased blood sugar of 1152. Pt placed on Iv insulin gtt.     Action/Plan:   CM will continue ot monitor for disposition needs. Unsure if pt can manage at home ith husband. Pt will need PT/OT for disposition recommendaitons. If plan for home pt will need HHRN for medication and disease management.   Anticipated DC Date:  11/15/2011   Anticipated DC Plan:  SKILLED NURSING FACILITY      DC Planning Services  CM consult      [facility] Choice  HOME HEALTH  Resumption Of Svcs/PTA Provider   Choice offered to / List presented to:  [name]        HH arranged  HH-1 RN  HH-10 DISEASE MANAGEMENT  HH-2 PT      HH agency  [facility]   Status of service:  Completed, signed off Medicare Important Message given?   (If response is "NO", the following Medicare IM given date fields will be blank) Date Medicare IM given:   Date Additional Medicare IM given:    Discharge Disposition:  HOME W HOME HEALTH SERVICES  Per UR Regulation:  Reviewed for med. necessity/level of care/duration of stay  If discussed at Long Length of Stay Meetings, dates discussed:    Comments:  11/13/2011 1300 Contacted Dr. Rito Ehrlich and requested orders for [facility]. Faxed orders and facesheet to [facility]. Added [facility] info to d/c instructions. Pt states she has cane and RW at home. She is weak but does not need to use her DME all the time. Dtr states she is requesting Podiatrist to look at her Mom's foot. Made pt's Unit RN aware and she will notify Dr. Rito Ehrlich. Instructed dtr to call Dr. Zella Ball office as soon as possible to follow up with appt in two weeks. Dtr states she will try to make sure her Mom is following up with her physicians as scheduled. Isidoro Donning RN CCM Case Mgmt phone  [phone]  11-12-11 [address], [place] [phone] CM spoke to pt and she stated that she is active with [facility] for PT services. MD will resume and order [facility] for medication /disease management assistance. Pt admitted with elevated blood sugars and MD is tweaking meds at this time. Pt states she is from home with husband. She has family support and they will help to monitor compliance with meds. SOC to begin within 24-48 hours post d/c.   11-11-11 1713 Tomi Bamberger, RN, BSN [phone] CM will continue to f/u in am.

## 2011-11-13 NOTE — Discharge Summary (Signed)
Physician Discharge Summary  Carmen [name]:[id number] DOB: [birth year] DOA: 11/11/2011  PCP: Gwynneth Aliment, MD  Admit date: 11/11/2011 Discharge date: 11/13/2011  Recommendations for Outpatient Follow-up:  1. Patient will follow up with her primary care physician in the next few weeks. At that time her CBGs to be further assess to see if any additional medications need to be adjusted. 2. Home health RN and PT will see the patient at home.  Discharge Diagnoses:  Principal Problem:  *Diabetic hyperosmolar non-ketotic state Active Problems:  HTN (hypertension), malignant  Tobacco abuse  DM (diabetes mellitus), type 2, uncontrolled, periph vascular complic  Peripheral neuropathy  Claudication in peripheral vascular disease status post stent to common iliac artery and 2011 and right subclavian artery stent 2010  Cardiac pacemaker for history of intermittent heart block  Pacemaker-[employer]  Nausea and vomiting  ARF (acute renal failure)  Constipation  Gastroparesis   Discharge Condition: Improved, being discharged home  Diet recommendation:  Carb modified, heart healthy  Filed Weights   11/11/11 0619 11/12/11 0605 11/13/11 0610  Weight: 44.453 kg (98 lb) 54.931 kg (121 lb 1.6 oz) 53.071 kg (117 lb)    History of present illness:  Her patient is a 78 year old Afro-American female past medical history of type 2 diabetes on insulin who has had a few admissions as of late for nonketotic hyperosmolar hyperglycemia as well as a CVA month ago and presented to the emergency room on 9/5 in the early morning hours with nausea and vomiting and unable to keep anything down for the past few days. He is a some background history from the family reported that the patient had very little appetite in the last 6 months  Has dropped about 40 pounds.apparently the patient does continue to take her Levemir on a consistent basis. Labs were checked and the patient was found to not be in DKA,  And  her sugars have been more than 1000.she was started on IV fluids and IV insulin and admitted to the hospitalist service for further workup and treatment.   Hospital Course:  Active Problems:  DM (diabetes mellitus), type 2: Patient did well and was able to be weaned off the insulin drip by the second half of hospital day one. An A1c was checked and found to be markedly elevated at 16.2 which is consistent with an average blood sugar of 402. The patient does report compliance and I suspect that much of her elevation of blood sugars was in part due to continued severe constipation which led to chronic stool and secondary bacterial translocation of the gut. Patient did not have any severe infection but suspect that this will greatly improve her symptoms. Nevertheless, she is only on 5 of the Levemir twice a day. Once we restarted this, her sugar still elevated so we increased to 15 units twice a day and she had one episode of hypoglycemia.  Her CBG only went down to 49 one time and improved immediately after with an amp of D50, so we feel the best new regimen for her to be on will be 10 units twice a day. Because of the gastroparesis, we also added Reglan to her long-term regimen.  HTN (hypertension), malignant: Pressure medications were initially on hold due to dehydration. Initially then started back her beta blocker and ACE inhibitor and as her pressures are started to continue to trend up, and presumed hydralazine. Given the patient's weight loss over the past few months, I suspect that she likely may  not need to be on his blood pressure medication long-term if she has been. Have plan to restart back her hydralazine, beta blocker and ACE inhibitor. Have decreased her diuretic to half its dose as well as continuing Aldactone. Have put her Imdur on hold for now this can be restarted as per her PCP.  Tobacco abuse: Patient continues to smoke. See below.   Peripheral neuropathy: Stable during his  hospitalization.  Claudication in peripheral vascular disease status post stent to common iliac artery and 2011 and right subclavian artery stent 2010: Continue aspirin and Pletal. The patient is quite stubborn, but these were able to which compromised her she promises to smoke less and make a pack of cigarettes last more days.  Cardiac pacemaker for history of intermittent heart block: Status post pacemaker  Pacemaker-[employer] secondary to heart block. This is stable medical issues in his hospitalization.  Unintentional weight loss: Patient is at very very poor intake for the past few months. This is likely from chronic constipation brought on by gastroparesis. With Reglan and aggressive bowel regimen, patient has had significant bowel movements.  By hospital day 2, the patient was actually quite hungry and wanting to eat. The family was quite surprised by this turnaround.  Gastroparesis/constipation: See above. By giving MiraLAX plus Reglan, the patient had several large bowel movements. She did require some manual disimpaction initially as well. When she has been cleaned out, I had extensive discussion with the family explaining that likely the patient's narcotic medication plus gastroparesis contributing to this event. The plan is for a bowel regimen and the patient on Colace 100 twice a day and Reglan and if the patient does not have a bowel movement every 24 hours to use when necessary MiraLAX. The patient and the family understands.  Diabetic hyperosmolar non-ketotic state: Resolved. See above.  Nausea and vomiting: This was all felt to be from chronic severe constipation plus gastroparesis. It resolved once patient's nonketotic hyperglycemia plus constipation issues resolved.   ARF (acute renal failure): Resolved with IV fluids, back to stage I chronic kidney disease at baseline. Felt to be secondary to severe dehydration brought on by severe  hypoglycemia.   Procedures:  None  Consultations:  None  Discharge Exam: Filed Vitals:   11/12/11 2106 11/12/11 2122 11/13/11 0610 11/13/11 1000  BP: 132/49 144/81 126/51 170/67  Pulse: 69 98 66 62  Temp: 99.1 F (37.3 C)  98.7 F (37.1 C)   TempSrc: Oral  Oral   Resp: 16  16   Height:      Weight:   53.071 kg (117 lb)   SpO2: 98%  95%     General: Alert and oriented x3 today, no acute distress, fatigued, looks older than stated age HEENT: Normocephalic, atraumatic, mucous membranes are moist Cardiovascular: Regular rate and rhythm, S1-S2 Respiratory: Clear to auscultation bilaterally, decreased breath sounds throughout Abdomen: Soft, nontender, nondistended, positive bowel sounds Extremities: No clubbing or cyanosis or edema. Patient does have a small bunion on the right heel plantar base.  Discharge Instructions  Discharge Orders    Future Appointments: Provider: Department: Dept Phone: Center:   12/16/2011 10:45 AM Duke Salvia, MD [facility] [phone] [facility]     Future Orders Please Complete By Expires   Diet - low sodium heart healthy      Diet Carb Modified      Increase activity slowly        Medication List  As of 11/13/2011  3:00 PM  STOP taking these medications         isosorbide mononitrate 60 MG 24 hr tablet         TAKE these medications         ALPRAZolam 0.5 MG tablet   Commonly known as: XANAX   Take 0.5 mg by mouth daily as needed. For anxiety      aspirin 81 MG chewable tablet   Chew 1 tablet (81 mg total) by mouth daily.      cilostazol 100 MG tablet   Commonly known as: PLETAL   Take 100 mg by mouth 2 (two) times daily.      docusate sodium 100 MG capsule   Commonly known as: COLACE   Take 1 capsule (100 mg total) by mouth 2 (two) times daily.      feeding supplement Liqd   Take 237 mLs by mouth 2 (two) times daily between meals.      furosemide 40 MG tablet   Commonly known as: LASIX   Take 0.5  tablets (20 mg total) by mouth daily.      hydrALAZINE 50 MG tablet   Commonly known as: APRESOLINE   Take 50 mg by mouth 4 (four) times daily.      HYDROcodone-acetaminophen 5-500 MG per tablet   Commonly known as: VICODIN   Take 1 tablet by mouth every 4 (four) hours as needed. For pain      insulin detemir 100 UNIT/ML injection   Commonly known as: LEVEMIR   Inject 10 Units into the skin 2 (two) times daily.      losartan 100 MG tablet   Commonly known as: COZAAR   Take 50 mg by mouth daily.      magnesium chloride 64 MG Tbec   Commonly known as: SLOW-MAG   Take 1 tablet by mouth daily.      metoCLOPramide 5 MG tablet   Commonly known as: REGLAN   Take 1 tablet (5 mg total) by mouth 4 (four) times daily -  before meals and at bedtime.      metoprolol tartrate 25 MG tablet   Commonly known as: LOPRESSOR   Take 25 mg by mouth 2 (two) times daily.      omega-3 acid ethyl esters 1 G capsule   Commonly known as: LOVAZA   Take 1 g by mouth 2 (two) times daily.      polyethylene glycol packet   Commonly known as: MIRALAX / GLYCOLAX   Take 17 g by mouth daily as needed. If no bowel movement every 24 hours      potassium chloride SA 20 MEQ tablet   Commonly known as: K-DUR,KLOR-CON   Take 1 tablet (20 mEq total) by mouth daily.      rosuvastatin 10 MG tablet   Commonly known as: CRESTOR   Take 10 mg by mouth daily.      spironolactone 50 MG tablet   Commonly known as: ALDACTONE   Take 1 tablet by mouth Daily.           Follow-up Information    Follow up with Gwynneth Aliment, MD. Schedule an appointment as soon as possible for a visit in 2 weeks.   Contact information:   [address] [place] [address] [phone]       Follow up with [facility]. [facility] RN, and Physical Therapy)    Contact information:   [phone]  The results of significant diagnostics from this hospitalization (including imaging,  microbiology, ancillary and laboratory) are listed below for reference.    Significant Diagnostic Studies: Ct Head Wo Contrast  11/11/2011 IMPRESSION: Atrophy with mild small vessel chronic ischemic changes of deep cerebral white matter. Old lacunar infarct at right pons. Known AVM anterior to left basal ganglia. No acute intracranial abnormalities.   Original Report Authenticated By: Lollie Marrow, M.D.      Dg Chest Portable 1 View  11/11/2011    IMPRESSION: Question emphysematous changes with right basilar atelectasis.   Original Report Authenticated By: Lollie Marrow, M.D.    Dg Abd Portable 1v  11/11/2011  IMPRESSION: Marked constipation with a very large stool ball in the rectum.   Original Report Authenticated By: Bernadene Bell. D'ALESSIO, M.D.      Labs: Basic Metabolic Panel:  Lab 11/11/11 1478 11/11/11 0700 11/11/11 0327 11/11/11 0115  NA 138 131* 127* 126*  K 3.8 4.4 5.2* 6.0*  CL 100 95* 89* 84*  CO2 29 23 30  33*  GLUCOSE 239* 584* 1002* 1152*  BUN 32* 35* 37* 40*  CREATININE 1.16* 1.31* 1.36* 1.62*  CALCIUM 10.4 10.2 10.2 10.6*  MG -- -- -- --  PHOS -- -- -- --   Liver Function Tests:  Lab 11/11/11 0700 11/11/11 0327  AST 34 37  ALT 122* 135*  ALKPHOS 132* 144*  BILITOT 0.2* 0.2*  PROT 6.5 7.1  ALBUMIN 3.4* 3.8   CBC:  Lab 11/11/11 0700 11/11/11 0115  WBC 5.6 5.5  NEUTROABS 3.6 3.7  HGB 16.5* 18.0*  HCT 46.1* 52.0*  MCV 92.4 97.4  PLT 149* 154   Cardiac Enzymes:  Lab 11/11/11 0700  CKTOTAL --  CKMB --  CKMBINDEX --  TROPONINI <0.30   CBG:  Lab 11/13/11 1146 11/13/11 0739 11/13/11 0507 11/13/11 0423 11/13/11  GLUCAP 197* 92 126* 49* 78    Time coordinating discharge: 45 minutes  Signed:  Hollice Espy  [facility] 11/13/2011, 3:00 PM

## 2011-12-16 ENCOUNTER — Encounter: Payer: Medicare Other | Admitting: Internal Medicine

## 2012-01-11 ENCOUNTER — Other Ambulatory Visit (HOSPITAL_COMMUNITY): Payer: Self-pay | Admitting: Internal Medicine

## 2012-02-03 ENCOUNTER — Encounter (HOSPITAL_COMMUNITY): Payer: Self-pay

## 2012-02-03 ENCOUNTER — Emergency Department (HOSPITAL_COMMUNITY)
Admission: EM | Admit: 2012-02-03 | Discharge: 2012-02-03 | Disposition: A | Discharge status: Home / Self Care | Payer: Medicare Other | Attending: Emergency Medicine | Admitting: Emergency Medicine

## 2012-02-03 DIAGNOSIS — Z87448 Personal history of other diseases of urinary system: Secondary | ICD-10-CM | POA: Insufficient documentation

## 2012-02-03 DIAGNOSIS — Z7982 Long term (current) use of aspirin: Secondary | ICD-10-CM | POA: Insufficient documentation

## 2012-02-03 DIAGNOSIS — I10 Essential (primary) hypertension: Secondary | ICD-10-CM | POA: Insufficient documentation

## 2012-02-03 DIAGNOSIS — Z8673 Personal history of transient ischemic attack (TIA), and cerebral infarction without residual deficits: Secondary | ICD-10-CM | POA: Insufficient documentation

## 2012-02-03 DIAGNOSIS — Z79899 Other long term (current) drug therapy: Secondary | ICD-10-CM | POA: Insufficient documentation

## 2012-02-03 DIAGNOSIS — F411 Generalized anxiety disorder: Secondary | ICD-10-CM | POA: Insufficient documentation

## 2012-02-03 DIAGNOSIS — Z794 Long term (current) use of insulin: Secondary | ICD-10-CM | POA: Insufficient documentation

## 2012-02-03 DIAGNOSIS — I739 Peripheral vascular disease, unspecified: Secondary | ICD-10-CM | POA: Insufficient documentation

## 2012-02-03 DIAGNOSIS — Z8739 Personal history of other diseases of the musculoskeletal system and connective tissue: Secondary | ICD-10-CM | POA: Insufficient documentation

## 2012-02-03 DIAGNOSIS — R609 Edema, unspecified: Secondary | ICD-10-CM

## 2012-02-03 DIAGNOSIS — I2510 Atherosclerotic heart disease of native coronary artery without angina pectoris: Secondary | ICD-10-CM | POA: Insufficient documentation

## 2012-02-03 DIAGNOSIS — Z950 Presence of cardiac pacemaker: Secondary | ICD-10-CM | POA: Insufficient documentation

## 2012-02-03 DIAGNOSIS — F17200 Nicotine dependence, unspecified, uncomplicated: Secondary | ICD-10-CM | POA: Insufficient documentation

## 2012-02-03 DIAGNOSIS — E876 Hypokalemia: Secondary | ICD-10-CM | POA: Insufficient documentation

## 2012-02-03 DIAGNOSIS — E119 Type 2 diabetes mellitus without complications: Secondary | ICD-10-CM | POA: Insufficient documentation

## 2012-02-03 LAB — BASIC METABOLIC PANEL
BUN: 8 mg/dL (ref 6–23)
CO2: 32 mEq/L (ref 19–32)
Calcium: 8 mg/dL — ABNORMAL LOW (ref 8.4–10.5)
Creatinine, Ser: 1.06 mg/dL (ref 0.50–1.10)
GFR calc non Af Amer: 50 mL/min — ABNORMAL LOW (ref >90)
Glucose, Bld: 444 mg/dL — ABNORMAL HIGH (ref 70–99)
Sodium: 135 mEq/L (ref 135–145)

## 2012-02-03 MED ORDER — FUROSEMIDE 20 MG PO TABS: 20.0000 mg | ORAL_TABLET | Freq: Every day | ORAL | Status: DC

## 2012-02-03 MED ORDER — FUROSEMIDE 10 MG/ML IJ SOLN
40.0000 mg | Freq: Once | INTRAMUSCULAR | Status: AC
  Administered 2012-02-03: 40 mg via INTRAVENOUS
  Filled 2012-02-03: qty 4

## 2012-02-03 MED ORDER — POTASSIUM CHLORIDE 10 MEQ/100ML IV SOLN
10.0000 meq | Freq: Once | INTRAVENOUS | Status: AC
  Administered 2012-02-03: 10 meq via INTRAVENOUS
  Filled 2012-02-03: qty 100

## 2012-02-03 MED ORDER — ACETAMINOPHEN-CODEINE #3 300-30 MG PO TABS: 1.0000 | ORAL_TABLET | Freq: Four times a day (QID) | ORAL | Status: DC | PRN

## 2012-02-03 MED ORDER — FUROSEMIDE 20 MG PO TABS: 20.0000 mg | ORAL_TABLET | Freq: Two times a day (BID) | ORAL | Status: DC

## 2012-02-03 MED ORDER — POTASSIUM CHLORIDE CRYS ER 20 MEQ PO TBCR
40.0000 meq | EXTENDED_RELEASE_TABLET | Freq: Once | ORAL | Status: AC
  Administered 2012-02-03: 40 meq via ORAL
  Filled 2012-02-03: qty 2

## 2012-02-03 NOTE — ED Notes (Signed)
IV attempted and unsuccessful. IV team called.

## 2012-02-03 NOTE — ED Notes (Signed)
Pt states she began having general edema approximately 2 weeks ago.  Pt states it has gotten worse.

## 2012-02-03 NOTE — ED Provider Notes (Signed)
History  This chart was scribed for [name], MD by Erskine Emery, ED Scribe. This patient was seen in room A02C/A02C and the patient's care was started at 14:20.   CSN: [id number]  Arrival date & time 02/03/12  1348   First MD Initiated Contact with Patient 02/03/12 1420      No chief complaint on file.   (Consider location/radiation/quality/duration/timing/severity/associated sxs/prior Treatment) Carmen [name] is a 78 y.o. female who presents to the Emergency Department complaining of diffuse edema that is hard and warm for the last couple weeks. Pt denies any associated SOB. Pt has a h/o kidney issues but she is not on dialysis. Pt has been taking a fluid pill (aldactone) daily, but was recently switched off lasix and told to stop ingesting salts in preparation for her cataracts surgery on Monday. Pt was on potassium daily but she is no longer taking it. Pt has a h/o DM, HTN, CAD, renal disorder, stroke, and peripheral vascular disease. Patient is a 78 y.o. female presenting with weakness. The history is provided by the patient and a relative. No language interpreter was used.  Weakness Primary symptoms do not include headaches, syncope, loss of consciousness, altered mental status, seizures, fever or vomiting. Primary symptoms comment: extremity edema The symptoms began more than 1 week ago. The symptoms are unchanged. The neurological symptoms are diffuse. Context: nothing.  Additional symptoms include weakness. Additional symptoms do not include hallucinations.   Dr. Allyne Gee is the pt's PCP.  Past Medical History  Diagnosis Date  . Diabetes mellitus   . Hypertension   . Coronary artery disease   . Renal disorder   . Herniated lumbar intervertebral disc   . Cataract     cataract surgery scheduled for 03/31/11, 2 - left eye, 1 - right eye  . Renovascular hypertension      s/p left renal artery stent 12/2007.   S/P balloon angioplasty on 02/16/10 for ISR, BP was  controlled  well since then.     . Claudication in peripheral vascular disease     02/16/10: Left CIA 9.0x28 Omnilink and REIA 8.0x40 seff expanding Zilver.   right subclavian artery stent 03/18/2008,  . S/P carotid endarterectomy      left carotid endarterectomy  1991; Stroke and TIA in 1999 with right sided weakness, now with residual right arm weakness  . Stroke     TIA  . Peripheral vascular disease   . Anxiety     Past Surgical History  Procedure Date  . Appendectomy   . Abdominal hysterectomy   . Ureteral stent placement   . Carotid endarterectomy   . Laminectomy   . Back surgery   . Colonoscopy 07/30/2011    Procedure: COLONOSCOPY;  Surgeon: Louis Meckel, MD;  Location: [facility] ENDOSCOPY;  Service: Endoscopy;  Laterality: N/A;  gi bleed  . Esophagogastroduodenoscopy 07/30/2011    Procedure: ESOPHAGOGASTRODUODENOSCOPY (EGD);  Surgeon: Louis Meckel, MD;  Location: [facility] ENDOSCOPY;  Service: Endoscopy;  Laterality: N/A;  . Pacemaker insertion     No family history on file.  History  Substance Use Topics  . Smoking status: Current Every Day Smoker -- 0.5 packs/day for 65 years    Types: Cigarettes  . Smokeless tobacco: Never Used  . Alcohol Use: No    OB History    Grav Para Term Preterm Abortions TAB SAB Ect Mult Living                  Review of Systems  Constitutional: Negative for fever and fatigue.  HENT: Negative for congestion, sinus pressure and ear discharge.   Eyes: Negative for discharge.  Respiratory: Negative for cough and shortness of breath.   Cardiovascular: Negative for chest pain and syncope.  Gastrointestinal: Negative for vomiting, abdominal pain and diarrhea.  Genitourinary: Negative for frequency and hematuria.  Musculoskeletal: Negative for back pain.       Extremity swelling  Skin: Negative for rash.  Neurological: Positive for weakness. Negative for seizures, loss of consciousness and headaches.  Hematological: Negative.   Psychiatric/Behavioral:  Negative for hallucinations and altered mental status.  All other systems reviewed and are negative.    Allergies  Norvasc and Peanut-containing drug products  Home Medications   Current Outpatient Rx  Name  Route  Sig  Dispense  Refill  . ALPRAZOLAM 0.5 MG PO TABS   Oral   Take 0.5 mg by mouth daily as needed. For anxiety         . ASPIRIN 81 MG PO CHEW   Oral   Chew 1 tablet (81 mg total) by mouth daily.   30 tablet   0   . CILOSTAZOL 100 MG PO TABS   Oral   Take 100 mg by mouth 2 (two) times daily.         . FUROSEMIDE 40 MG PO TABS   Oral   Take 0.5 tablets (20 mg total) by mouth daily.   30 tablet   0   . HYDRALAZINE HCL 50 MG PO TABS   Oral   Take 50 mg by mouth 4 (four) times daily.          . INSULIN DETEMIR 100 UNIT/ML [place] SOLN   Subcutaneous   Inject 10 Units into the skin 2 (two) times daily.   10 mL   1   . LOSARTAN POTASSIUM 100 MG PO TABS   Oral   Take 50 mg by mouth daily.         Marland Kitchen MAGNESIUM CHLORIDE 64 MG PO TBEC   Oral   Take 1 tablet by mouth daily.         Marland Kitchen METOPROLOL TARTRATE 25 MG PO TABS   Oral   Take 25 mg by mouth 2 (two) times daily.         . OMEGA-3-ACID ETHYL ESTERS 1 G PO CAPS   Oral   Take 1 g by mouth 2 (two) times daily.         Marland Kitchen POTASSIUM CHLORIDE CRYS ER 20 MEQ PO TBCR   Oral   Take 1 tablet (20 mEq total) by mouth daily.   30 tablet   0   . ROSUVASTATIN CALCIUM 10 MG PO TABS   Oral   Take 10 mg by mouth daily.         Marland Kitchen SPIRONOLACTONE 50 MG PO TABS   Oral   Take 1 tablet by mouth Daily.           Triage Vitals: BP 201/75  Pulse 69  Temp 98.4 F (36.9 C) (Oral)  Resp 16  SpO2 96%  Physical Exam  Constitutional: She is oriented to person, place, and time. She appears well-developed.  HENT:  Head: Normocephalic and atraumatic.  Eyes: Conjunctivae normal and EOM are normal. No scleral icterus.  Neck: Neck supple. No thyromegaly present.  Cardiovascular: Normal rate and regular  rhythm.  Exam reveals no gallop and no friction rub.   No murmur heard. Pulmonary/Chest: No stridor. She has no  wheezes. She has no rales. She exhibits no tenderness.  Abdominal: She exhibits no distension. There is no tenderness. There is no rebound.  Musculoskeletal: Normal range of motion. She exhibits edema.       2+ edema all the way up to knees on both sides. Mild edema in left forearm and hand.  Lymphadenopathy:    She has no cervical adenopathy.  Neurological: She is oriented to person, place, and time. Coordination normal.  Skin: No rash noted. No erythema.  Psychiatric: She has a normal mood and affect. Her behavior is normal.    ED Course  Procedures (including critical care time) DIAGNOSTIC STUDIES: Oxygen Saturation is 96% on room air, adequate by my interpretation.    COORDINATION OF CARE: 14:20--I evaluated the patient and we discussed a treatment plan including IV lasix and blood work to which the pt and her daughter agreed.  15:32--I rechecked the pt who has still not had her IV put in. I notified her and her daughter that her blood work shows she has low potassium levels and high blood sugars. I explained that I would look through her records in more detail to better understand why she is having this recurrent edema.  17:18--I rechecked the pt who is improved. Pt reports she did have a significant amount of urination. I told the pt that she needs to take 2 of her potassium pills/day (of which she claims she has a full bottle) and that I would also put her on a fluid pill. I instructed the pt to follow up with Dr. Allyne Gee early next week.   Results for orders placed during the hospital encounter of 02/03/12  BASIC METABOLIC PANEL      Component Value Range   Sodium 135  135 - 145 mEq/L   Potassium 2.1 (*) 3.5 - 5.1 mEq/L   Chloride 97  96 - 112 mEq/L   CO2 32  19 - 32 mEq/L   Glucose, Bld 444 (*) 70 - 99 mg/dL   BUN 8  6 - 23 mg/dL   Creatinine, Ser 4.54  0.50 -  1.10 mg/dL   Calcium 8.0 (*) 8.4 - 10.5 mg/dL   GFR calc non Af Amer 50 (*) >90 mL/min   GFR calc Af Amer 58 (*) >90 mL/min      No diagnosis found.    MDM     The chart was scribed for me under my direct supervision.  I personally performed the history, physical, and medical decision making and all procedures in the evaluation of this patient.[name], MD 02/03/12 1723

## 2012-02-03 NOTE — ED Notes (Signed)
Patient has been having generalized edema x 2 weeks. No SOB or CP. No weakness. Will continue to monitor.

## 2012-02-03 NOTE — ED Notes (Signed)
Dr. Estell Harpin  Made aware of critical potassium level

## 2012-03-04 ENCOUNTER — Inpatient Hospital Stay (HOSPITAL_COMMUNITY)
Admission: EM | Admit: 2012-03-04 | Discharge: 2012-03-08 | DRG: 640 | Disposition: A | Discharge status: Home / Self Care | Payer: Medicare Other | Attending: Internal Medicine | Admitting: Internal Medicine

## 2012-03-04 ENCOUNTER — Encounter (HOSPITAL_COMMUNITY): Payer: Self-pay

## 2012-03-04 ENCOUNTER — Emergency Department (HOSPITAL_COMMUNITY): Payer: Medicare Other

## 2012-03-04 ENCOUNTER — Other Ambulatory Visit (HOSPITAL_COMMUNITY): Payer: Medicare Other

## 2012-03-04 DIAGNOSIS — F17200 Nicotine dependence, unspecified, uncomplicated: Secondary | ICD-10-CM | POA: Diagnosis present

## 2012-03-04 DIAGNOSIS — E1159 Type 2 diabetes mellitus with other circulatory complications: Secondary | ICD-10-CM | POA: Diagnosis present

## 2012-03-04 DIAGNOSIS — E1151 Type 2 diabetes mellitus with diabetic peripheral angiopathy without gangrene: Secondary | ICD-10-CM

## 2012-03-04 DIAGNOSIS — E860 Dehydration: Secondary | ICD-10-CM | POA: Diagnosis present

## 2012-03-04 DIAGNOSIS — I69959 Hemiplegia and hemiparesis following unspecified cerebrovascular disease affecting unspecified side: Secondary | ICD-10-CM

## 2012-03-04 DIAGNOSIS — E162 Hypoglycemia, unspecified: Secondary | ICD-10-CM

## 2012-03-04 DIAGNOSIS — R531 Weakness: Secondary | ICD-10-CM

## 2012-03-04 DIAGNOSIS — Z7982 Long term (current) use of aspirin: Secondary | ICD-10-CM

## 2012-03-04 DIAGNOSIS — IMO0002 Reserved for concepts with insufficient information to code with codable children: Secondary | ICD-10-CM | POA: Diagnosis present

## 2012-03-04 DIAGNOSIS — I798 Other disorders of arteries, arterioles and capillaries in diseases classified elsewhere: Secondary | ICD-10-CM | POA: Diagnosis present

## 2012-03-04 DIAGNOSIS — F411 Generalized anxiety disorder: Secondary | ICD-10-CM | POA: Diagnosis present

## 2012-03-04 DIAGNOSIS — I639 Cerebral infarction, unspecified: Secondary | ICD-10-CM

## 2012-03-04 DIAGNOSIS — I6350 Cerebral infarction due to unspecified occlusion or stenosis of unspecified cerebral artery: Secondary | ICD-10-CM

## 2012-03-04 DIAGNOSIS — E876 Hypokalemia: Principal | ICD-10-CM | POA: Diagnosis present

## 2012-03-04 DIAGNOSIS — Z794 Long term (current) use of insulin: Secondary | ICD-10-CM

## 2012-03-04 DIAGNOSIS — Z9181 History of falling: Secondary | ICD-10-CM

## 2012-03-04 DIAGNOSIS — Z8673 Personal history of transient ischemic attack (TIA), and cerebral infarction without residual deficits: Secondary | ICD-10-CM

## 2012-03-04 DIAGNOSIS — D696 Thrombocytopenia, unspecified: Secondary | ICD-10-CM | POA: Diagnosis present

## 2012-03-04 DIAGNOSIS — E43 Unspecified severe protein-calorie malnutrition: Secondary | ICD-10-CM

## 2012-03-04 DIAGNOSIS — G9340 Encephalopathy, unspecified: Secondary | ICD-10-CM | POA: Diagnosis present

## 2012-03-04 DIAGNOSIS — E1165 Type 2 diabetes mellitus with hyperglycemia: Secondary | ICD-10-CM | POA: Diagnosis present

## 2012-03-04 DIAGNOSIS — I10 Essential (primary) hypertension: Secondary | ICD-10-CM

## 2012-03-04 DIAGNOSIS — Z681 Body mass index (BMI) 19 or less, adult: Secondary | ICD-10-CM

## 2012-03-04 DIAGNOSIS — I2510 Atherosclerotic heart disease of native coronary artery without angina pectoris: Secondary | ICD-10-CM | POA: Diagnosis present

## 2012-03-04 DIAGNOSIS — Z950 Presence of cardiac pacemaker: Secondary | ICD-10-CM

## 2012-03-04 DIAGNOSIS — E119 Type 2 diabetes mellitus without complications: Secondary | ICD-10-CM | POA: Diagnosis present

## 2012-03-04 LAB — URINE MICROSCOPIC-ADD ON

## 2012-03-04 LAB — CBC WITH DIFFERENTIAL/PLATELET
Basophils Absolute: 0 10*3/uL (ref 0.0–0.1)
Eosinophils Relative: 0 % (ref 0–5)
HCT: 46.7 % — ABNORMAL HIGH (ref 36.0–46.0)
Hemoglobin: 16.7 g/dL — ABNORMAL HIGH (ref 12.0–15.0)
Lymphocytes Relative: 12 % (ref 12–46)
MCV: 96.3 fL (ref 78.0–100.0)
Monocytes Absolute: 0.1 10*3/uL (ref 0.1–1.0)
Monocytes Relative: 2 % — ABNORMAL LOW (ref 3–12)
Neutro Abs: 4.4 10*3/uL (ref 1.7–7.7)
RDW: 12.9 % (ref 11.5–15.5)
WBC: 5.1 10*3/uL (ref 4.0–10.5)

## 2012-03-04 LAB — URINALYSIS, ROUTINE W REFLEX MICROSCOPIC
Bilirubin Urine: NEGATIVE
Glucose, UA: NEGATIVE mg/dL
Ketones, ur: NEGATIVE mg/dL
Nitrite: NEGATIVE
Specific Gravity, Urine: 1.013 (ref 1.005–1.030)
pH: 7.5 (ref 5.0–8.0)

## 2012-03-04 LAB — BASIC METABOLIC PANEL
BUN: 19 mg/dL (ref 6–23)
BUN: 19 mg/dL (ref 6–23)
CO2: 38 mEq/L — ABNORMAL HIGH (ref 19–32)
CO2: 38 mEq/L — ABNORMAL HIGH (ref 19–32)
Calcium: 9 mg/dL (ref 8.4–10.5)
Calcium: 8.1 mg/dL — ABNORMAL LOW (ref 8.4–10.5)
Chloride: 91 mEq/L — ABNORMAL LOW (ref 96–112)
Chloride: 92 mEq/L — ABNORMAL LOW (ref 96–112)
Chloride: 91 mEq/L — ABNORMAL LOW (ref 96–112)
Creatinine, Ser: 0.9 mg/dL (ref 0.50–1.10)
GFR calc non Af Amer: 67 mL/min — ABNORMAL LOW (ref >90)
Glucose, Bld: 96 mg/dL (ref 70–99)
Glucose, Bld: 277 mg/dL — ABNORMAL HIGH (ref 70–99)
Potassium: <2 mEq/L — CL (ref 3.5–5.1)
Potassium: 2.1 mEq/L — CL (ref 3.5–5.1)
Sodium: 136 mEq/L (ref 135–145)
Sodium: 133 mEq/L — ABNORMAL LOW (ref 135–145)

## 2012-03-04 LAB — CK TOTAL AND CKMB (NOT AT ARMC)
CK, MB: 5 ng/mL — ABNORMAL HIGH (ref 0.3–4.0)
Relative Index: 3.4 — ABNORMAL HIGH (ref 0.0–2.5)

## 2012-03-04 LAB — GLUCOSE, CAPILLARY
Glucose-Capillary: 55 mg/dL — ABNORMAL LOW (ref 70–99)
Glucose-Capillary: 134 mg/dL — ABNORMAL HIGH (ref 70–99)
Glucose-Capillary: 269 mg/dL — ABNORMAL HIGH (ref 70–99)

## 2012-03-04 MED ORDER — SODIUM CHLORIDE 0.9 % IV SOLN: INTRAVENOUS | Status: DC

## 2012-03-04 MED ORDER — POTASSIUM CHLORIDE CRYS ER 20 MEQ PO TBCR
40.0000 meq | EXTENDED_RELEASE_TABLET | Freq: Three times a day (TID) | ORAL | Status: AC
  Administered 2012-03-04 – 2012-03-05 (×3): 40 meq via ORAL
  Filled 2012-03-04 (×4): qty 2

## 2012-03-04 MED ORDER — CLONIDINE HCL 0.3 MG/24HR TD PTWK
0.3000 mg | MEDICATED_PATCH | TRANSDERMAL | Status: DC
  Administered 2012-03-04: 0.3 mg via TRANSDERMAL
  Filled 2012-03-04: qty 1

## 2012-03-04 MED ORDER — DEXTROSE 50 % IV SOLN
1.0000 | Freq: Once | INTRAVENOUS | Status: AC
  Administered 2012-03-04: 50 mL via INTRAVENOUS

## 2012-03-04 MED ORDER — HYDRALAZINE HCL 20 MG/ML IJ SOLN
5.0000 mg | Freq: Four times a day (QID) | INTRAMUSCULAR | Status: DC | PRN
  Administered 2012-03-04 – 2012-03-06 (×4): 5 mg via INTRAVENOUS
  Filled 2012-03-04 (×3): qty 1
  Filled 2012-03-04: qty 0.25
  Filled 2012-03-04: qty 1

## 2012-03-04 MED ORDER — POTASSIUM CHLORIDE 10 MEQ/100ML IV SOLN
10.0000 meq | INTRAVENOUS | Status: AC
  Administered 2012-03-04 – 2012-03-05 (×5): 10 meq via INTRAVENOUS
  Filled 2012-03-04: qty 500

## 2012-03-04 MED ORDER — POTASSIUM CHLORIDE 10 MEQ/100ML IV SOLN
10.0000 meq | INTRAVENOUS | Status: AC
  Administered 2012-03-04 (×4): 10 meq via INTRAVENOUS

## 2012-03-04 MED ORDER — POTASSIUM CHLORIDE 10 MEQ/100ML IV SOLN
10.0000 meq | INTRAVENOUS | Status: AC
  Administered 2012-03-04 (×4): 10 meq via INTRAVENOUS
  Filled 2012-03-04 (×2): qty 100

## 2012-03-04 MED ORDER — ACETAMINOPHEN 650 MG RE SUPP: 650.0000 mg | RECTAL | Status: DC | PRN

## 2012-03-04 MED ORDER — DEXTROSE-NACL 5-0.45 % IV SOLN
INTRAVENOUS | Status: DC
  Administered 2012-03-04 – 2012-03-05 (×4): via INTRAVENOUS

## 2012-03-04 MED ORDER — POTASSIUM CHLORIDE 10 MEQ/100ML IV SOLN
10.0000 meq | Freq: Once | INTRAVENOUS | Status: AC
  Administered 2012-03-04: 10 meq via INTRAVENOUS
  Filled 2012-03-04: qty 100

## 2012-03-04 MED ORDER — ASPIRIN 325 MG PO TABS
325.0000 mg | ORAL_TABLET | Freq: Every day | ORAL | Status: DC
  Administered 2012-03-04 – 2012-03-05 (×2): 325 mg via ORAL
  Filled 2012-03-04 (×2): qty 1

## 2012-03-04 MED ORDER — ASPIRIN 300 MG RE SUPP
300.0000 mg | Freq: Every day | RECTAL | Status: DC
  Filled 2012-03-04 (×2): qty 1

## 2012-03-04 MED ORDER — DEXTROSE 50 % IV SOLN
INTRAVENOUS | Status: AC
  Administered 2012-03-04: 50 mL via INTRAVENOUS
  Filled 2012-03-04: qty 50

## 2012-03-04 MED ORDER — ACETAMINOPHEN 325 MG PO TABS
650.0000 mg | ORAL_TABLET | ORAL | Status: DC | PRN
  Administered 2012-03-04 – 2012-03-07 (×3): 650 mg via ORAL
  Filled 2012-03-04 (×4): qty 2

## 2012-03-04 NOTE — ED Notes (Signed)
lsn last pm at 11pm, found by son this am in floor, unwitnessed fall, unk down time, they placed her in bed pta by ems. Pt had cbg of 41, they gave dextrose and repeat was 242 but still altered, contracted on left arm which is not normal pt does have normal contracture to right side, pt without visual tracking, unable to recall familiar family members. Pt is alert to person and place and responds to verbal stimuli on arrival

## 2012-03-04 NOTE — Evaluation (Signed)
Clinical/Bedside Swallow Evaluation Patient Details  Name: Carmen [name] MRN: [id number] Date of Birth: [birth year]  Today's Date: 03/04/2012 Time: 1430-1500 SLP Time Calculation (min): 30 min  Past Medical History:  Past Medical History  Diagnosis Date  . Diabetes mellitus   . Hypertension   . Coronary artery disease   . Renal disorder   . Herniated lumbar intervertebral disc   . Cataract     cataract surgery scheduled for 03/31/11, 2 - left eye, 1 - right eye  . Renovascular hypertension      s/p left renal artery stent 12/2007.   S/P balloon angioplasty on 02/16/10 for ISR, BP was  controlled well since then.     . Claudication in peripheral vascular disease     02/16/10: Left CIA 9.0x28 Omnilink and REIA 8.0x40 seff expanding Zilver.   right subclavian artery stent 03/18/2008,  . S/P carotid endarterectomy      left carotid endarterectomy  1991; Stroke and TIA in 1999 with right sided weakness, now with residual right arm weakness  . Stroke     TIA  . Peripheral vascular disease   . Anxiety    Past Surgical History:  Past Surgical History  Procedure Date  . Appendectomy   . Abdominal hysterectomy   . Ureteral stent placement   . Carotid endarterectomy   . Laminectomy   . Back surgery   . Colonoscopy 07/30/2011    Procedure: COLONOSCOPY;  Surgeon: Louis Meckel, MD;  Location: [facility] ENDOSCOPY;  Service: Endoscopy;  Laterality: N/A;  gi bleed  . Esophagogastroduodenoscopy 07/30/2011    Procedure: ESOPHAGOGASTRODUODENOSCOPY (EGD);  Surgeon: Louis Meckel, MD;  Location: [facility] ENDOSCOPY;  Service: Endoscopy;  Laterality: N/A;  . Pacemaker insertion    HPI:  78 year old woman history of diabetes, stroke, hypokalemia she presented via EMS with history of fall, confusion, hypoglycemia. An EGD was found to have profound hypokalemia.  CT negative.  Patient referred for BSE per stroke protocol.    Assessment / Plan / Recommendation Clinical Impression  Oropharyngeal swallow  functional for regular consistency and thin liquids.  No outward s/s of aspiration noted.  Patient's dentures not available during evaluation but family member present report that dentures will be brought in from home this PM.  Due to lack of  dentition and patient presenting with lethargy recommend to initiate modified diet of dysphagia 2 with thin liquids.  Recommend aspiration precautions and full supervision with all meals due to decreased LOA. ST to follow briefly in acute care setting for diet tolerance and possible advancement.       Aspiration Risk  Mild    Diet Recommendation Dysphagia 2 (Fine chop);Thin liquid   Liquid Administration via: Cup;Straw Medication Administration: Whole meds with liquid Supervision: Full supervision/cueing for compensatory strategies;Patient able to self feed Compensations: Slow rate;Small sips/bites Postural Changes and/or Swallow Maneuvers: Seated upright 90 degrees;Upright 30-60 min after meal    Other  Recommendations Oral Care Recommendations: Oral care QID Other Recommendations: Clarify dietary restrictions   Follow Up Recommendations       Frequency and Duration min 2x/week  2 weeks       SLP Swallow Goals Patient will consume recommended diet without observed clinical signs of aspiration with: Supervision/safety Patient will utilize recommended strategies during swallow to increase swallowing safety with: Supervision/safety   Swallow Study Prior Functional Status  Lived at home and tolerated regular diet consistency and thin liquids     General Date of Onset: 03/03/12 HPI: 78 year old woman history  of diabetes, stroke, hypokalemia she presented via EMS with history of fall, confusion, hypoglycemia. An EGD was found to have profound hypokalemia. Type of Study: Bedside swallow evaluation Diet Prior to this Study: NPO Temperature Spikes Noted: No Respiratory Status: Supplemental O2 delivered via (comment) (nasal cannula) History of  Recent Intubation: No Behavior/Cognition: Cooperative;Pleasant mood;Lethargic Oral Cavity - Dentition: Dentures, not available;Edentulous Self-Feeding Abilities: Total assist Patient Positioning: Upright in bed Baseline Vocal Quality: Clear Volitional Cough: Strong Volitional Swallow: Able to elicit    Oral/Motor/Sensory Function Overall Oral Motor/Sensory Function: Impaired at baseline Labial ROM: Reduced right Labial Symmetry: Abnormal symmetry right Labial Strength: Reduced Labial Sensation: Reduced Lingual ROM: Reduced right Lingual Strength: Reduced Lingual Sensation: Reduced Facial ROM: Within Functional Limits Facial Symmetry: Within Functional Limits Facial Strength: Reduced Facial Sensation: Reduced Velum: Within Functional Limits Mandible: Within Functional Limits   Ice Chips Ice chips: Within functional limits Presentation: Spoon   Thin Liquid Thin Liquid: Within functional limits Presentation: Cup;Straw    Nectar Thick Nectar Thick Liquid: Not tested   Honey Thick Honey Thick Liquid: Not tested   Puree Puree: Within functional limits Presentation: Spoon   Solid   GO    Solid: Within functional limits      Moreen Fowler MS, CCC-SLP [phone] [facility] 03/04/2012,4:21 PM

## 2012-03-04 NOTE — ED Provider Notes (Signed)
History     CSN: [id number]  Arrival date & time 03/04/12  7829   First MD Initiated Contact with Patient 03/04/12 [phone]      Chief Complaint  Patient presents with  . Cerebrovascular Accident    (Consider location/radiation/quality/duration/timing/severity/associated sxs/prior treatment) HPI Comments: Patient presents to ER for evaluation of possible stroke. Patient was reportedly found on the floor next to her bed by family this morning. She was last seen at around 10 PM last night. He reports that she was normal at bedtime. Family placed her back in the bed, but noticed that she was not moving her left arm or leg. She also seems to be persistently looking to the right. EMS was called. They found her to have temperature of 41, administered dextrose without improvement of her condition. Family also concerned because she seems much more confused than normal. She cannot answer simple questions.  Patient is a 78 y.o. female presenting with Acute Neurological Problem.  Cerebrovascular Accident    Past Medical History  Diagnosis Date  . Diabetes mellitus   . Hypertension   . Coronary artery disease   . Renal disorder   . Herniated lumbar intervertebral disc   . Cataract     cataract surgery scheduled for 03/31/11, 2 - left eye, 1 - right eye  . Renovascular hypertension      s/p left renal artery stent 12/2007.   S/P balloon angioplasty on 02/16/10 for ISR, BP was  controlled well since then.     . Claudication in peripheral vascular disease     02/16/10: Left CIA 9.0x28 Omnilink and REIA 8.0x40 seff expanding Zilver.   right subclavian artery stent 03/18/2008,  . S/P carotid endarterectomy      left carotid endarterectomy  1991; Stroke and TIA in 1999 with right sided weakness, now with residual right arm weakness  . Stroke     TIA  . Peripheral vascular disease   . Anxiety     Past Surgical History  Procedure Date  . Appendectomy   . Abdominal hysterectomy   . Ureteral  stent placement   . Carotid endarterectomy   . Laminectomy   . Back surgery   . Colonoscopy 07/30/2011    Procedure: COLONOSCOPY;  Surgeon: Louis Meckel, MD;  Location: [facility] ENDOSCOPY;  Service: Endoscopy;  Laterality: N/A;  gi bleed  . Esophagogastroduodenoscopy 07/30/2011    Procedure: ESOPHAGOGASTRODUODENOSCOPY (EGD);  Surgeon: Louis Meckel, MD;  Location: [facility] ENDOSCOPY;  Service: Endoscopy;  Laterality: N/A;  . Pacemaker insertion     No family history on file.  History  Substance Use Topics  . Smoking status: Current Every Day Smoker -- 0.5 packs/day for 65 years    Types: Cigarettes  . Smokeless tobacco: Never Used  . Alcohol Use: No    OB History    Grav Para Term Preterm Abortions TAB SAB Ect Mult Living                  Review of Systems  Neurological: Positive for speech difficulty and weakness.  Psychiatric/Behavioral: Positive for confusion.  All other systems reviewed and are negative.    Allergies  Norvasc and Peanut-containing drug products  Home Medications   Current Outpatient Rx  Name  Route  Sig  Dispense  Refill  . ACETAMINOPHEN-CODEINE #3 300-30 MG PO TABS   Oral   Take 1 tablet by mouth every 6 (six) hours as needed for pain.   30 tablet   0   .  ALPRAZOLAM 0.5 MG PO TABS   Oral   Take 0.5 mg by mouth daily as needed. For anxiety         . ASPIRIN 81 MG PO CHEW   Oral   Chew 1 tablet (81 mg total) by mouth daily.   30 tablet   0   . CILOSTAZOL 100 MG PO TABS   Oral   Take 100 mg by mouth 2 (two) times daily.         Marland Kitchen CLONIDINE HCL 0.2 MG/24HR TD PTWK   Transdermal   Place 1 patch onto the skin once a week. Wednesday         . FUROSEMIDE 20 MG PO TABS   Oral   Take 1 tablet (20 mg total) by mouth 2 (two) times daily.   10 tablet   0   . FUROSEMIDE 20 MG PO TABS   Oral   Take 1 tablet (20 mg total) by mouth daily.   10 tablet   0   . FUROSEMIDE 40 MG PO TABS   Oral   Take 0.5 tablets (20 mg total) by mouth  daily.   30 tablet   0   . HYDRALAZINE HCL 50 MG PO TABS   Oral   Take 50 mg by mouth 4 (four) times daily.          . INSULIN DETEMIR 100 UNIT/ML [place] SOLN   Subcutaneous   Inject 10 Units into the skin 2 (two) times daily.   10 mL   1   . LOSARTAN POTASSIUM 100 MG PO TABS   Oral   Take 50 mg by mouth daily.         Marland Kitchen METOCLOPRAMIDE HCL 5 MG PO TABS   Oral   Take 5 mg by mouth 4 (four) times daily.         Marland Kitchen METOPROLOL TARTRATE 25 MG PO TABS   Oral   Take 25 mg by mouth 2 (two) times daily.         . OMEGA-3-ACID ETHYL ESTERS 1 G PO CAPS   Oral   Take 1 g by mouth 2 (two) times daily.         Marland Kitchen ROSUVASTATIN CALCIUM 10 MG PO TABS   Oral   Take 10 mg by mouth daily.         Marland Kitchen SPIRONOLACTONE 50 MG PO TABS   Oral   Take 1 tablet by mouth Daily.           BP 102/64  Pulse 59  Temp 97.3 F (36.3 C) (Oral)  Resp 16  SpO2 100%  Physical Exam  Constitutional: She is oriented to person, place, and time. No distress.  HENT:  Head: Normocephalic and atraumatic.  Right Ear: Hearing normal.  Nose: Nose normal.  Mouth/Throat: Oropharynx is clear and moist and mucous membranes are normal.  Eyes: Conjunctivae normal are normal. Pupils are equal, round, and reactive to light. Right eye exhibits abnormal extraocular motion. Left eye exhibits abnormal extraocular motion.       Persistent right gaze  Neck: Normal range of motion. Neck supple.  Cardiovascular: Normal rate, regular rhythm, S1 normal and S2 normal.  Exam reveals no gallop and no friction rub.   No murmur heard. Pulmonary/Chest: Effort normal and breath sounds normal. No respiratory distress. She exhibits no tenderness.  Abdominal: Soft. Normal appearance and bowel sounds are normal. There is no hepatosplenomegaly. There is no tenderness. There is no rebound, no  guarding, no tenderness at McBurney's point and negative Murphy's sign. No hernia.  Musculoskeletal: She exhibits no edema.       Left  shoulder: She exhibits normal strength.       Left hip: She exhibits normal strength.  Neurological: She is alert and oriented to person, place, and time. A cranial nerve deficit and sensory deficit is present. She exhibits abnormal muscle tone. Coordination normal. GCS eye subscore is 4. GCS verbal subscore is 5. GCS motor subscore is 6.       Persistent flexion right upper extremity (chronic secondary to stroke per family)  1/5 left lower extremity strength 3/5 left upper extremity strength  Skin: Skin is warm, dry and intact. No rash noted. No cyanosis.  Psychiatric: Her speech is normal.    ED Course  Procedures (including critical care time)   Date: 03/04/2012  Rate: 60  Rhythm: normal sinus rhythm  QRS Axis: normal  Intervals: normal  ST/T Wave abnormalities: ST elevations anteriorly  Conduction Disutrbances:none  Narrative Interpretation:   Old EKG Reviewed: unchanged    Labs Reviewed  CBC WITH DIFFERENTIAL - Abnormal; Notable for the following:    Hemoglobin 16.7 (*)     HCT 46.7 (*)     MCH 34.4 (*)     Platelets 108 (*)  PLATELET COUNT CONFIRMED BY SMEAR   Neutrophils Relative 86 (*)     Lymphs Abs 0.6 (*)     Monocytes Relative 2 (*)     All other components within normal limits  BASIC METABOLIC PANEL - Abnormal; Notable for the following:    Potassium <2.0 (*)     Chloride 91 (*)     CO2 38 (*)     GFR calc non Af Amer 61 (*)     GFR calc Af Amer 70 (*)     All other components within normal limits  CK TOTAL AND CKMB - Abnormal; Notable for the following:    CK, MB 5.0 (*)     Relative Index 3.4 (*)     All other components within normal limits  TROPONIN I  URINALYSIS, ROUTINE W REFLEX MICROSCOPIC   Ct Head Wo Contrast  03/04/2012  *RADIOLOGY REPORT*  Clinical Data: Fall with left-sided weakness.  Mental status changes.  CT HEAD WITHOUT CONTRAST  Technique:  Contiguous axial images were obtained from the base of the skull through the vertex without  contrast.  Comparison: 11/11/2011  Findings: Bone windows demonstrate mild soft tissue swelling about the left parietal region on image 50/series 3.  Mild hyperostosis frontalis interna.  No underlying skull fracture.  Aerated petrous apices. Clear paranasal sinuses and mastoid air cells.  Soft tissue windows demonstrate mild low density in the periventricular white matter likely related to small vessel disease.More focal hypoattenuation within the left basal ganglia is related to prior AVM, detailed on prior report. Remote lacunar infarct in the right side pons on image 9/series 2. No large vessel cortically based infarct.  No hemorrhage, hydrocephalus, intra-axial, or extra-axial fluid collections.  IMPRESSION:  1.  Left sided scalp soft tissue swelling, mild. 2. No acute intracranial abnormality. 3.  Small vessel ischemic change with remote right basil ganglia lacunar infarct.   Original Report Authenticated By: Jeronimo Greaves, M.D.      1. CVA (cerebral vascular accident)   2. Hypokalemia       MDM  Patient brought to the ER for evaluation of possible stroke symptoms. Patient was found on the floor by family this  morning with left-sided deficit. She was therefore never a candidate for thrombolytics. Her last known normal neurologic state was 10 PM last night.  Examination does reveal left hemiparesis with persistent right gaze concerning for acute CVA. CAT scan, however, did not show any acute findings. Patient seems to be moving her left leg more now than she did originally at arrival. Her examination was difficult because of confusion. Patient is found to be profoundly hypokalemic with a potassium less than 2. She was started on IV potassium. I was concerned that she might aspirate is given any by mouth potassium. Patient will require hospitalization for further workup. Case was discussed with the hospitalist on call, patient will be admitted to team 4.        Gilda Crease,  MD 03/04/12 1126

## 2012-03-04 NOTE — ED Notes (Signed)
Notified RN of CBG [id number] 

## 2012-03-04 NOTE — H&P (Addendum)
History and Physical  Carmen [name] UJW:[id number] DOB: 07/15/1933 DOA: 03/04/2012  Referring physician: Jaci Carrel, MD PCP: Gwynneth Aliment, MD   Chief Complaint: Confusion  HPI:  78 year old woman history of diabetes, stroke, hypokalemia she presented via EMS with history of fall, confusion, hypoglycemia. An EGD was found to have profound hypokalemia.  All history obtained from granddaughter [place] at bedside. Unable to reach husband by telephone. Patient lives with husband and brother.  Patient was in usual state of health yesterday when her granddaughter left her last night around 10 PM. No antecedent illness or complaints were noted. Patient is ambulating without difficulty and without assistant device.  This morning granddaughter was called at approximately 10 AM report that the patient had been found on the floor approximately 5 AM by her husband and put back into bed. However the patient failed to get up and was noted to be confused and have left arm stiffness at which point the granddaughter was called. Granddaughter went to the house, noted the patient be confused, to have a contracted left arm which was new and left leg weakness. Right arm contracture is chronic. EMS noted blood sugar of 41 which did not improve symptoms.  In the emergency department was noted be afebrile, markedly hypertensive, no hypoxia. Noted to have left arm contracture however was noted that her left leg did improve in strength while in the emergency department. Patient noted to be confused. Potassium was less than 2, blood sugar 55-96, CT head negative. EKG independently reviewed revealed sinus rhythm with left ventricular hypertrophy and repolarization abnormality, no acute changes seen.  Chart Review:  Seen in ED 02/03/2012 for lower extremity edema at which time potassium was 2.1. It was recommended she increase her potassium supplementation. Lasix apparently was increased as  well.  Hospitalizations  11/2011: Diabetic hyperosmolar nonketotic state  10/2011: Stroke, diabetic hyperosmolar state  07/2011: Lower GI bleed, thrombocytopenia  06/2011: Hypoglycemia, Mobitz type II heart block with pacemaker placement.  05/2011: Severe hypokalemia (2.0), thought secondary to Lasix.   Review of Systems:  Unobtainable from patient as above. Granddaughter denies any known fever, visual changes, sore throat, rash, new muscle aches, chest pain, shortness of breath, dysuria, bleeding, nausea, vomiting, abdominal pain.  Past Medical History  Diagnosis Date  . Diabetes mellitus   . Hypertension   . Coronary artery disease   . Renal disorder   . Herniated lumbar intervertebral disc   . Cataract     cataract surgery scheduled for 03/31/11, 2 - left eye, 1 - right eye  . Renovascular hypertension      s/p left renal artery stent 12/2007.   S/P balloon angioplasty on 02/16/10 for ISR, BP was  controlled well since then.     . Claudication in peripheral vascular disease     02/16/10: Left CIA 9.0x28 Omnilink and REIA 8.0x40 seff expanding Zilver.   right subclavian artery stent 03/18/2008,  . S/P carotid endarterectomy      left carotid endarterectomy  1991; Stroke and TIA in 1999 with right sided weakness, now with residual right arm weakness  . Stroke     TIA  . Peripheral vascular disease   . Anxiety     Past Surgical History  Procedure Date  . Appendectomy   . Abdominal hysterectomy   . Ureteral stent placement   . Carotid endarterectomy   . Laminectomy   . Back surgery   . Colonoscopy 07/30/2011    Procedure: COLONOSCOPY;  Surgeon: Louis Meckel, MD;  Location: [facility] ENDOSCOPY;  Service: Endoscopy;  Laterality: N/A;  gi bleed  . Esophagogastroduodenoscopy 07/30/2011    Procedure: ESOPHAGOGASTRODUODENOSCOPY (EGD);  Surgeon: Louis Meckel, MD;  Location: [facility] ENDOSCOPY;  Service: Endoscopy;  Laterality: N/A;  . Pacemaker insertion     Social History:  reports  that she has been smoking Cigarettes.  She has a 32.5 pack-year smoking history. She has never used smokeless tobacco. She reports that she does not drink alcohol or use illicit drugs.  Allergies  Allergen Reactions  . Norvasc (Amlodipine Besylate) Swelling  . Peanut-Containing Drug Products Other (See Comments)    Cause my stomach to hurt    Family History  Problem Relation Age of Onset  . Cancer Father      Prior to Admission medications   Medication Sig Start Date End Date Taking? Authorizing Provider  aspirin 81 MG chewable tablet Chew 81 mg by mouth daily. 10/18/11 10/17/12 Yes Altha Harm, MD  chlorthalidone (HYGROTON) 25 MG tablet Take 25 mg by mouth daily.   Yes Historical Provider, MD  cilostazol (PLETAL) 100 MG tablet Take 100 mg by mouth 2 (two) times daily.   Yes Historical Provider, MD  cloNIDine (CATAPRES - DOSED IN MG/24 HR) 0.3 mg/24hr Place 1 patch onto the skin once a week.   Yes Historical Provider, MD  furosemide (LASIX) 20 MG tablet Take 20 mg by mouth daily. 02/03/12  Yes [name], MD  hydrALAZINE (APRESOLINE) 50 MG tablet Take 50 mg by mouth 4 (four) times daily.    Yes Historical Provider, MD  insulin detemir (LEVEMIR) 100 UNIT/ML injection Inject 10 Units into the skin 2 (two) times daily. 11/13/11 11/12/12 Yes Hollice Espy, MD  losartan (COZAAR) 100 MG tablet Take 50 mg by mouth daily.   Yes Historical Provider, MD  metoCLOPramide (REGLAN) 5 MG tablet Take 5 mg by mouth 4 (four) times daily.   Yes Historical Provider, MD  metoprolol (LOPRESSOR) 100 MG tablet Take 100 mg by mouth 2 (two) times daily.   Yes Historical Provider, MD  omega-3 acid ethyl esters (LOVAZA) 1 G capsule Take 1 g by mouth 2 (two) times daily.   Yes Historical Provider, MD  acetaminophen-codeine (TYLENOL #3) 300-30 MG per tablet Take 1 tablet by mouth every 6 (six) hours as needed. For pain 02/03/12   [name], MD  ALPRAZolam Prudy Feeler) 0.5 MG tablet Take 0.5 mg by mouth  daily as needed. For anxiety    Historical Provider, MD   Physical Exam: Filed Vitals:   03/04/12 0930 03/04/12 1024 03/04/12 1030 03/04/12 1045  BP: 160/87  104/59 102/64  Pulse: 56 62 59 59  Temp:      TempSrc:      Resp: 13  17 16   SpO2: 100% 100% 100% 100%    General:  Examined in emergency department. Calm, comfortable. Opens eyes to command answers yes to most questions. Able to follow simple commands with her extremities. Recognizes her granddaughter. Clearly confused. Eyes: Difficult to get participate in exam. However pupils, lids, irises & conjunctiva appear normal. I did not detect a gaze preference at this point. ENT: grossly normal hearing, lips & tongue Neck: no LAD, masses or thyromegaly, no point tenderness. Range of movement appears intact. Cardiovascular: RRR, no m/r/g. No LE edema. Respiratory: CTA bilaterally, no w/r/r. Normal respiratory effort. Abdomen: soft, ntnd Skin: no rash or induration seen Musculoskeletal: Right upper extremity contracted (baseline per family) able to grip with right hand to command. Moves  right lower leg with grossly normal tone and strength. Left upper extremity is contracted, however patient able to grip and to slowly passively extend arm. It is not lift left leg to command but does maintain on in position when assisted with passive movement. Psychiatric: As above appears confused, does not answer questions appropriately. Neurologic: Cranial nerves appear grossly intact although examination is limited. Otherwise as above.  Wt Readings from Last 3 Encounters:  11/13/11 53.071 kg (117 lb)  10/17/11 51.9 kg (114 lb 6.7 oz)  07/31/11 56 kg (123 lb 7.3 oz)   Labs on Admission:  Basic Metabolic Panel:  Lab 03/04/12 1610  NA 138  K <2.0*  CL 91*  CO2 38*  GLUCOSE 96  BUN 19  CREATININE 0.90  CALCIUM 9.0  MG --  PHOS --   CBC:  Lab 03/04/12 0904  WBC 5.1  NEUTROABS 4.4  HGB 16.7*  HCT 46.7*  MCV 96.3  PLT 108*   Cardiac  Enzymes:  Lab 03/04/12 0905  CKTOTAL 147  CKMB 5.0*  CKMBINDEX --  TROPONINI <0.30   CBG:  Lab 03/04/12 1202 03/04/12 1128  GLUCAP 134* 55*     Radiological Exams on Admission: Ct Head Wo Contrast  03/04/2012  *RADIOLOGY REPORT*  Clinical Data: Fall with left-sided weakness.  Mental status changes.  CT HEAD WITHOUT CONTRAST  Technique:  Contiguous axial images were obtained from the base of the skull through the vertex without contrast.  Comparison: 11/11/2011  Findings: Bone windows demonstrate mild soft tissue swelling about the left parietal region on image 50/series 3.  Mild hyperostosis frontalis interna.  No underlying skull fracture.  Aerated petrous apices. Clear paranasal sinuses and mastoid air cells.  Soft tissue windows demonstrate mild low density in the periventricular white matter likely related to small vessel disease.More focal hypoattenuation within the left basal ganglia is related to prior AVM, detailed on prior report. Remote lacunar infarct in the right side pons on image 9/series 2. No large vessel cortically based infarct.  No hemorrhage, hydrocephalus, intra-axial, or extra-axial fluid collections.  IMPRESSION:  1.  Left sided scalp soft tissue swelling, mild. 2. No acute intracranial abnormality. 3.  Small vessel ischemic change with remote right basil ganglia lacunar infarct.   Original Report Authenticated By: Jeronimo Greaves, M.D.     EKG: Independently reviewed. As above   Principal Problem:  *Hypokalemia Active Problems:  HTN (hypertension), malignant  DM (diabetes mellitus), type 2, uncontrolled, periph vascular complic  History of CVA (cerebrovascular accident) with associated mild right upper extremity hemiplegia  Cardiac pacemaker for history of intermittent heart block  CVA (cerebral infarction)  Acute encephalopathy   Assessment/Plan 1. Acute encephalopathy--suspect multifactorial including stroke, hypoglycemia and hypokalemia. Check urinalysis,  however no history to suggest CNS infection. No MRI secondary to pacemaker. Treat acute issues and reassess. 2. Profound hypokalemia--replete IV. Replete oral when mental status improves. Serial basic metabolic panel. Check magnesium level. Check TSH. Suspect secondary to diuretic (Lasix, question chlorthalidone also listened). Note this has been seen in the past. 3. Recurrent hypoglycemia--likely secondary to basal insulin combined with lack of oral intake. Close monitoring with serial capillary blood sugar checks. 4. Suspected stroke--supportive care at this point. Unable to perform MRI. ST, PT, OT evaluations. Had 2-D echocardiogram earlier this year which was unremarkable and therefore not repeat. Carotid Dopplers earlier this year revealed moderate stenosis. Repeat. 5. Fall--etiology unclear. CT head negative. No neck pain with palpation. Range of movement appears grossly intact. Monitor. 6. Mild elevation  of CK-MB--no history to suggest acute coronary syndrome. Likely related to fall. No further evaluation suggested at this time. 7. Thrombocytopenia--etiology and significance unclear. Repeat CBC in a.m. 8. Diabetes mellitus--hold insulin. Dextrose infusion. 9. Hypertension--allow permissive hypertension for now. 10. Smoker--per family not interested in cessation.  Discussed current impression with family at bedside including diagnostic uncertainty, all questions answered. Discussed suspected stroke as well as profound hypokalemia and need for close monitoring. Given recurrent hypoglycemia and profound hypokalemia admit to step down.  Code Status: Full code per her granddaughter, I have been unable to reach the husband and number provided Family Communication: As above Disposition Plan/Anticipated LOS: 2 days  Time spent: 65 minutes  Brendia Sacks, MD  [facility] Team 4  Pager [phone] If 7PM-7AM, please contact night-coverage at [email or web address], password [facility] 03/04/2012, 11:50  AM

## 2012-03-04 NOTE — Progress Notes (Signed)
CRITICAL VALUE ALERT  Critical value received:  K <2  Date of notification:  03/04/2012  Time of notification:  3:45 PM   Critical value read back:yes  Nurse who received alert:  Celesta Gentile  MD notified (1st page):  Irene Limbo  Time of first page:  3:46 PM   MD notified (2nd page):  Time of second page:  Responding MD:  Irene Limbo  Time MD responded:  [phone]

## 2012-03-04 NOTE — Progress Notes (Signed)
Pt admitted to 3300 from ED. Oriented to unit and room. Safety discussed and call bell with in reach. VSS.   Elijah Birk, RN

## 2012-03-04 NOTE — Progress Notes (Signed)
Pt bp is 202/75, checked in both arms. Notified md. Received order for hydralazine PRN. Gave 5mg  iv hydralazine. Will continue to monitor bp.

## 2012-03-04 NOTE — ED Notes (Signed)
Pt returned from radiology.

## 2012-03-04 NOTE — ED Notes (Signed)
Pt here by ems for possible stroke, pt was found in floor for unknown amt of time this am by son and husband, pt has new contracture noted ot left arm today, hx of stroke with right side deficit noted, pt had initial cbg of 41 and after d10 with ems come upt to 242. Pt responds to verbal stimuli, with family at bedside pt is able to call names of family.

## 2012-03-04 NOTE — ED Notes (Signed)
Reported critical potassium of less than 2 to dr Oletta Cohn

## 2012-03-04 NOTE — Progress Notes (Signed)
CRITICAL VALUE ALERT  Critical value received: K 2.1  Date of notification: 03/04/2012  Time of notification:  2235  Critical value read back:yes  Nurse who received alert:  Dorie Rank   MD notified (1st page):  Lenny Pastel pa  Time of first page:  2230  MD notified (2nd page):  Time of second page:  Responding MD:  Lenny Pastel pa  Time MD responded:  2236

## 2012-03-05 DIAGNOSIS — Z950 Presence of cardiac pacemaker: Secondary | ICD-10-CM

## 2012-03-05 LAB — BASIC METABOLIC PANEL
BUN: 18 mg/dL (ref 6–23)
BUN: 19 mg/dL (ref 6–23)
CO2: 33 mEq/L — ABNORMAL HIGH (ref 19–32)
Calcium: 7.6 mg/dL — ABNORMAL LOW (ref 8.4–10.5)
Chloride: 92 mEq/L — ABNORMAL LOW (ref 96–112)
Chloride: 97 mEq/L (ref 96–112)
Creatinine, Ser: 0.83 mg/dL (ref 0.50–1.10)
GFR calc Af Amer: 77 mL/min — ABNORMAL LOW (ref >90)
GFR calc Af Amer: >90 mL/min (ref >90)
GFR calc non Af Amer: 67 mL/min — ABNORMAL LOW (ref >90)
Glucose, Bld: 295 mg/dL — ABNORMAL HIGH (ref 70–99)
Glucose, Bld: 286 mg/dL — ABNORMAL HIGH (ref 70–99)
Potassium: 2.3 mEq/L — CL (ref 3.5–5.1)
Potassium: 2.3 mEq/L — CL (ref 3.5–5.1)
Sodium: 132 mEq/L — ABNORMAL LOW (ref 135–145)
Sodium: 136 mEq/L (ref 135–145)

## 2012-03-05 LAB — LIPID PANEL
HDL: 56 mg/dL (ref >39)
LDL Cholesterol: 69 mg/dL (ref 0–99)
Total CHOL/HDL Ratio: 2.6 RATIO
Triglycerides: 101 mg/dL (ref <150)

## 2012-03-05 LAB — HEMOGLOBIN A1C
Hgb A1c MFr Bld: 11.1 % — ABNORMAL HIGH (ref <5.7)
Mean Plasma Glucose: 272 mg/dL — ABNORMAL HIGH (ref <117)

## 2012-03-05 LAB — CBC
HCT: 35.9 % — ABNORMAL LOW (ref 36.0–46.0)
Platelets: 86 10*3/uL — ABNORMAL LOW (ref 150–400)
RBC: 3.7 MIL/uL — ABNORMAL LOW (ref 3.87–5.11)
RDW: 13.3 % (ref 11.5–15.5)
WBC: 5.7 10*3/uL (ref 4.0–10.5)

## 2012-03-05 LAB — GLUCOSE, CAPILLARY
Glucose-Capillary: 224 mg/dL — ABNORMAL HIGH (ref 70–99)
Glucose-Capillary: 217 mg/dL — ABNORMAL HIGH (ref 70–99)

## 2012-03-05 LAB — MAGNESIUM: Magnesium: 1.6 mg/dL (ref 1.5–2.5)

## 2012-03-05 MED ORDER — ASPIRIN 81 MG PO CHEW
81.0000 mg | CHEWABLE_TABLET | Freq: Every day | ORAL | Status: DC
  Administered 2012-03-06 – 2012-03-08 (×3): 81 mg via ORAL
  Filled 2012-03-05 (×3): qty 1

## 2012-03-05 MED ORDER — INSULIN ASPART 100 UNIT/ML ~~LOC~~ SOLN
0.0000 [IU] | Freq: Every day | SUBCUTANEOUS | Status: DC
  Administered 2012-03-05 – 2012-03-07 (×2): 3 [IU] via SUBCUTANEOUS

## 2012-03-05 MED ORDER — METOPROLOL TARTRATE 100 MG PO TABS
100.0000 mg | ORAL_TABLET | Freq: Two times a day (BID) | ORAL | Status: DC
  Administered 2012-03-05 – 2012-03-08 (×7): 100 mg via ORAL
  Filled 2012-03-05 (×8): qty 1

## 2012-03-05 MED ORDER — INSULIN ASPART 100 UNIT/ML ~~LOC~~ SOLN
0.0000 [IU] | Freq: Three times a day (TID) | SUBCUTANEOUS | Status: DC
  Administered 2012-03-05 (×2): 5 [IU] via SUBCUTANEOUS
  Administered 2012-03-06 – 2012-03-07 (×4): 3 [IU] via SUBCUTANEOUS
  Administered 2012-03-07: 11 [IU] via SUBCUTANEOUS
  Administered 2012-03-08: 5 [IU] via SUBCUTANEOUS
  Administered 2012-03-08: 2 [IU] via SUBCUTANEOUS

## 2012-03-05 MED ORDER — MAGNESIUM SULFATE 40 MG/ML IJ SOLN
2.0000 g | Freq: Once | INTRAMUSCULAR | Status: AC
  Administered 2012-03-05: 2 g via INTRAVENOUS
  Filled 2012-03-05: qty 50

## 2012-03-05 MED ORDER — POTASSIUM CHLORIDE CRYS ER 20 MEQ PO TBCR
40.0000 meq | EXTENDED_RELEASE_TABLET | Freq: Three times a day (TID) | ORAL | Status: AC
  Administered 2012-03-05 – 2012-03-07 (×6): 40 meq via ORAL
  Filled 2012-03-05 (×5): qty 2

## 2012-03-05 MED ORDER — POTASSIUM CHLORIDE IN NACL 40-0.9 MEQ/L-% IV SOLN
INTRAVENOUS | Status: DC
  Administered 2012-03-05: 14:00:00 via INTRAVENOUS
  Administered 2012-03-06: 50 mL/h via INTRAVENOUS
  Administered 2012-03-06: 20 mL/h via INTRAVENOUS
  Filled 2012-03-05 (×4): qty 1000

## 2012-03-05 MED ORDER — CILOSTAZOL 100 MG PO TABS
100.0000 mg | ORAL_TABLET | Freq: Two times a day (BID) | ORAL | Status: DC
  Administered 2012-03-05 – 2012-03-08 (×7): 100 mg via ORAL
  Filled 2012-03-05 (×9): qty 1

## 2012-03-05 MED ORDER — OMEGA-3-ACID ETHYL ESTERS 1 G PO CAPS
1.0000 g | ORAL_CAPSULE | Freq: Two times a day (BID) | ORAL | Status: DC
  Administered 2012-03-05 – 2012-03-08 (×7): 1 g via ORAL
  Filled 2012-03-05 (×8): qty 1

## 2012-03-05 NOTE — Evaluation (Addendum)
Occupational Therapy Evaluation Patient Details Name: Carmen [name] MRN: [id number] DOB: 10-21-1933 Today's Date: 03/05/2012 Time: 1191-4782 OT Time Calculation (min): 28 min  OT Assessment / Plan / Recommendation Clinical Impression  Pt admitted with confusion s/p fall at home. history of diabetes, stroke, and hypokalemia. Will benefit from continued OT services to address below problem list. Recommending HHOT vs none pending progress.    OT Assessment  Patient needs continued OT Services    Follow Up Recommendations  Home health OT;Other (comment) (vs none pending progress)    Barriers to Discharge      Equipment Recommendations  None recommended by OT    Recommendations for Other Services    Frequency  Min 2X/week    Precautions / Restrictions Precautions Precautions: Fall Restrictions Weight Bearing Restrictions: No   Pertinent Vitals/Pain BP of 220/90 immediately following bed mobility then 209/94 after sitting EOB 1 minute. RN aware and at bedside.  RN called MD who verbalized PT/OT able to get pt up to chair and bathroom. Pt asymptomatic and denied dizziness.     ADL  Eating/Feeding: Performed;Independent Where Assessed - Eating/Feeding: Chair;Bed level Upper Body Dressing: Performed;Set up Where Assessed - Upper Body Dressing: Unsupported sitting Toilet Transfer: Performed;Minimal assistance Toilet Transfer Method: Sit to stand Toilet Transfer Equipment: Regular height toilet;Grab bars Toileting - Clothing Manipulation and Hygiene: Performed;Min guard Where Assessed - Toileting Clothing Manipulation and Hygiene: Sit to stand from 3-in-1 or toilet Equipment Used: Gait belt Transfers/Ambulation Related to ADLs: min assist with HHA x1.  Pt takes small steps and slightly unsteady. Would benefit from RW. ADL Comments: Pt slightly unsteady during standing tasks but reports she feels the same as PTA.    OT Diagnosis: Generalized weakness  OT Problem List: Decreased  activity tolerance;Impaired balance (sitting and/or standing);Decreased safety awareness;Decreased knowledge of use of DME or AE OT Treatment Interventions: Self-care/ADL training;DME and/or AE instruction;Therapeutic activities;Patient/family education;Balance training   OT Goals Acute Rehab OT Goals OT Goal Formulation: With patient Time For Goal Achievement: 03/12/12 Potential to Achieve Goals: Good ADL Goals Pt Will Perform Grooming: with supervision;Standing at sink ADL Goal: Grooming - Progress: Goal set today Pt Will Perform Lower Body Dressing: with supervision;Sit to stand from chair;Sit to stand from bed ADL Goal: Lower Body Dressing - Progress: Goal set today Pt Will Transfer to Toilet: with supervision;Ambulation;with DME;Comfort height toilet ADL Goal: Toilet Transfer - Progress: Goal set today Pt Will Perform Toileting - Clothing Manipulation: with supervision;Standing;Sitting on 3-in-1 or toilet ADL Goal: Toileting - Clothing Manipulation - Progress: Goal set today Pt Will Perform Toileting - Hygiene: with supervision;Sit to stand from 3-in-1/toilet;Standing at 3-in-1/toilet ADL Goal: Toileting - Hygiene - Progress: Goal set today Pt Will Perform Tub/Shower Transfer: Tub transfer;with supervision;Ambulation;with DME;Shower seat with back ADL Goal: Web designer - Progress: Goal set today Miscellaneous OT Goals Miscellaneous OT Goal #1: Pt will perform all functional mobility at supervision level while consistently demosntrating safe use of DME (if needed). OT Goal: Miscellaneous Goal #1 - Progress: Goal set today  Visit Information  Last OT Received On: 03/05/12 Assistance Needed: +1 (2 helpful with lines and leads) PT/OT Co-Evaluation/Treatment: Yes    Subjective Data      Prior Functioning     Home Living Lives With: Spouse;Son Available Help at Discharge: Family;Available 24 hours/day Type of Home: House Home Access: Stairs to enter [employer] of Steps: 3 Entrance Stairs-Rails: Right;Left;Can reach both Home Layout: One level Bathroom Shower/Tub: Forensic scientist: Standard Bathroom Accessibility:  No Home Adaptive Equipment: Walker - rolling;Bedside commode/3-in-1;Shower chair with back Additional Comments: Family member has shower chair that she reports she will bring to hosue for pt to use. Prior Function Level of Independence: Independent Able to Take Stairs?: Yes Driving: No Vocation: Retired Musician: No difficulties Dominant Hand: Left         Vision/Perception     Cognition  Overall Cognitive Status: Impaired Area of Impairment: Safety/judgement Arousal/Alertness: Awake/alert Orientation Level: Appears intact for tasks assessed Behavior During Session: [facility] for tasks performed Safety/Judgement: Impulsive;Decreased awareness of need for assistance Safety/Judgement - Other Comments: Pt attempting to stand from bed and toilet multiple times when asked to remain seated by therapist ( due to attempting to manage lines before beginning transfer).     Extremity/Trunk Assessment Right Upper Extremity Assessment RUE ROM/Strength/Tone: [facility] for tasks assessed;Deficits RUE ROM/Strength/Tone Deficits: 3+/5 - 4/5 (at baseline due to old stroke per pt and family report) RUE Sensation: WFL - Light Touch;WFL - Proprioception RUE Coordination: WFL - gross/fine motor (slightly ataxic - at baseline due to old stroke) Left Upper Extremity Assessment LUE ROM/Strength/Tone: WFL for tasks assessed LUE Sensation: WFL - Light Touch;WFL - Proprioception LUE Coordination: WFL - gross/fine motor     Mobility Bed Mobility Bed Mobility: Supine to Sit;Sitting - Scoot to Edge of Bed Supine to Sit: 5: Supervision;HOB elevated Sitting - Scoot to Edge of Bed: 5: Supervision Transfers Transfers: Sit to Stand;Stand to Sit Sit to Stand: 4: Min assist;From bed;From toilet;With upper  extremity assist Stand to Sit: 4: Min assist;To chair/3-in-1;To toilet;To bed;With armrests;With upper extremity assist Details for Transfer Assistance: Assist for balance and steadying throughout transfer.     Shoulder Instructions     Exercise     Balance     End of Session OT - End of Session Equipment Utilized During Treatment: Gait belt Activity Tolerance: Patient tolerated treatment well Patient left: in chair;with call bell/phone within reach;with family/visitor present Nurse Communication: Mobility status;Other (comment) (BP increased during bed mobility)  GO    03/05/2012 Cipriano Mile OTR/L Pager [phone] Office [phone]  Cipriano Mile 03/05/2012, 1:38 PM

## 2012-03-05 NOTE — Progress Notes (Signed)
VASCULAR LAB PRELIMINARY  PRELIMINARY  PRELIMINARY  PRELIMINARY  Carotid Dopplers completed.    Preliminary report:  There is <40% right ICA stenosis.  There is no significant left ICA stenosis.  Bilateral vertebral artery flow is antegrade.  Elizabeth Paulsen, RVT 03/05/2012, 9:52 AM

## 2012-03-05 NOTE — Evaluation (Signed)
Physical Therapy Evaluation Patient Details Name: Carmen [name] MRN: [id number] DOB: 04/25/1933 Today's Date: 03/05/2012 Time: 3244-0102 PT Time Calculation (min): 28 min  PT Assessment / Plan / Recommendation Clinical Impression  Pt admitted with L sided weakness and confusion, r/o CVA. CT revealed small acute basal ganglia infarct. Pt will benefit from skilled PT in the acute care setting in order to maximize functional mobility and safety prior to d/c home with family and 24/7 supervision    PT Assessment  Patient needs continued PT services    Follow Up Recommendations  Home health PT;Supervision for mobility/OOB    Does the patient have the potential to tolerate intense rehabilitation      Barriers to Discharge        Equipment Recommendations  None recommended by PT    Recommendations for Other Services     Frequency Min 4X/week    Precautions / Restrictions Precautions Precautions: Fall Restrictions Weight Bearing Restrictions: No   Pertinent Vitals/Pain No complaints of pain     Mobility  Bed Mobility Bed Mobility: Supine to Sit;Sitting - Scoot to Edge of Bed Supine to Sit: 5: Supervision;HOB elevated Sitting - Scoot to Edge of Bed: 5: Supervision Transfers Transfers: Sit to Stand;Stand to Sit Sit to Stand: 4: Min assist;From bed;From toilet;With upper extremity assist Stand to Sit: 4: Min assist;To chair/3-in-1;To toilet;To bed;With armrests;With upper extremity assist Details for Transfer Assistance: Assist for balance and steadying throughout transfer. Ambulation/Gait Ambulation/Gait Assistance: 4: Min assist Ambulation Distance (Feet): 20 Feet Assistive device: 1 person hand held assist Ambulation/Gait Assistance Details: Assist for stability. Distance limited by BP. See OT note for vitals Gait Pattern: Step-to pattern;Decreased stride length;Decreased hip/knee flexion - right;Decreased hip/knee flexion - left Gait velocity: slow Stairs: No Modified  Rankin (Stroke Patients Only) Pre-Morbid Rankin Score: No symptoms Modified Rankin: Moderately severe disability    Shoulder Instructions     Exercises     PT Diagnosis: Abnormality of gait;Difficulty walking  PT Problem List: Decreased activity tolerance;Decreased balance;Decreased mobility;Decreased knowledge of use of DME;Decreased safety awareness;Decreased knowledge of precautions PT Treatment Interventions: DME instruction;Gait training;Stair training;Functional mobility training;Therapeutic activities;Balance training;Neuromuscular re-education;Patient/family education   PT Goals Acute Rehab PT Goals PT Goal Formulation: With patient Time For Goal Achievement: 03/12/12 Potential to Achieve Goals: Good Pt will go Sit to Stand: with modified independence PT Goal: Sit to Stand - Progress: Goal set today Pt will go Stand to Sit: with modified independence PT Goal: Stand to Sit - Progress: Goal set today Pt will Transfer Bed to Chair/Chair to Bed: with supervision PT Transfer Goal: Bed to Chair/Chair to Bed - Progress: Goal set today Pt will Ambulate: 51 - 150 feet;with supervision;with least restrictive assistive device PT Goal: Ambulate - Progress: Goal set today Pt will Go Up / Down Stairs: 3-5 stairs;with supervision;with rail(s) PT Goal: Up/Down Stairs - Progress: Goal set today  Visit Information  Last PT Received On: 03/05/12 Assistance Needed: +1 (2 helpful with lines and leads) PT/OT Co-Evaluation/Treatment: Yes    Subjective Data  Patient Stated Goal: to go home   Prior Functioning  Home Living Lives With: Spouse;Son Available Help at Discharge: Family;Available 24 hours/day Type of Home: House Home Access: Stairs to enter [employer] of Steps: 3 Entrance Stairs-Rails: Right;Left;Can reach both Home Layout: One level Bathroom Shower/Tub: Forensic scientist: Standard Bathroom Accessibility: No Home Adaptive Equipment: Walker -  rolling;Bedside commode/3-in-1;Shower chair with back Additional Comments: Family member has shower chair that she reports she will bring  to hosue for pt to use. Prior Function Level of Independence: Independent Able to Take Stairs?: Yes Driving: No Vocation: Retired Musician: No difficulties Dominant Hand: Left    Cognition  Overall Cognitive Status: Impaired Area of Impairment: Safety/judgement Arousal/Alertness: Awake/alert Orientation Level: Appears intact for tasks assessed Behavior During Session: WFL for tasks performed Safety/Judgement: Impulsive;Decreased awareness of need for assistance Safety/Judgement - Other Comments: Pt attempting to stand from bed and toilet multiple times when asked to remain seated by therapist ( due to attempting to manage lines before beginning transfer).     Extremity/Trunk Assessment Right Upper Extremity Assessment RUE ROM/Strength/Tone: [facility] for tasks assessed;Deficits RUE ROM/Strength/Tone Deficits: 3+/5 - 4/5 (at baseline due to old stroke per pt and family report) RUE Sensation: WFL - Light Touch;WFL - Proprioception RUE Coordination: WFL - gross/fine motor (slightly ataxic - at baseline due to old stroke) Left Upper Extremity Assessment LUE ROM/Strength/Tone: WFL for tasks assessed LUE Sensation: WFL - Light Touch;WFL - Proprioception LUE Coordination: WFL - gross/fine motor Right Lower Extremity Assessment RLE ROM/Strength/Tone: Within functional levels RLE Sensation: WFL - Light Touch Left Lower Extremity Assessment LLE ROM/Strength/Tone: Within functional levels LLE Sensation: WFL - Light Touch   Balance    End of Session PT - End of Session Equipment Utilized During Treatment: Gait belt Activity Tolerance: Patient tolerated treatment well Patient left: in chair;with call bell/phone within reach;with family/visitor present Nurse Communication: Mobility status  GP     Milana Kidney 03/05/2012, 1:49  PM  03/05/2012 Milana Kidney DPT PAGER: [phone] OFFICE: [id number]

## 2012-03-05 NOTE — Progress Notes (Signed)
Speech Language Pathology Dysphagia Treatment Patient Details Name: Carmen [name] MRN: [id number] DOB: Jan 20, 1934 Today's Date: 03/05/2012 Time: 1430-1500 SLP Time Calculation (min): 30 min  Assessment / Plan / Recommendation Clinical Impression  Purpose of dysphagia treatment for diet tolerance of dysphagia 2/thin liquids and for possible advancement following initial BSE completed on 03/04/12.  Dentures available during this treatment.  No observed s/s aspiration noted during direct observation of patient eating regular solids.  Improvement in LOA in comparison to 03/04/12.  Recommend upgrade to regular solids ( don dentures for meals) and continue thin liquids. ST to follow briefly in acute care setting for diet tolerance to ensure safety.      Diet Recommendation  Continue with Current Diet: Regular;Thin liquid    SLP Plan Continue with current plan of care      Swallowing Goals  SLP Swallowing Goals Swallow Study Goal #1 - Progress: Progressing toward goal Swallow Study Goal #2 - Progress: Progressing toward goal  General Temperature Spikes Noted: No Respiratory Status: Room air Behavior/Cognition: Alert;Cooperative;Pleasant mood Oral Cavity - Dentition: Dentures, top;Dentures, bottom Patient Positioning: Upright in bed  Oral Cavity - Oral Hygiene Does patient have any of the following "at risk" factors?: None of the above Brush patient's teeth BID with toothbrush (using toothpaste with fluoride): Yes   Dysphagia Treatment Treatment focused on: Skilled observation of diet tolerance;Upgraded PO texture trials;Facilitation of pharyngeal phase;Facilitation of oral preparatory phase;Patient/family/caregiver education Treatment Methods/Modalities: Skilled observation;Differential diagnosis Patient observed directly with PO's: Yes Type of PO's observed: Regular Feeding: Able to feed self Liquids provided via: Cup;Straw Type of cueing: Verbal Amount of cueing: Minimal   GO   Moreen Fowler MS, CCC-SLP [phone] [facility] 03/05/2012, 4:46 PM

## 2012-03-05 NOTE — Progress Notes (Signed)
CRITICAL VALUE ALERT  Critical value received:  k 2.3 Date of notification:  03/05/2012  Time of notification:  0330  Critical value read back:yes  Nurse who received alert:  Dorie Rank   MD notified (1st page):  Lenny Pastel PA  Time of first page:  [phone]  MD notified (2nd page):  Time of second page:  Responding MD:  Lenny Pastel  Time MD responded:  0340 Dorie Rank

## 2012-03-05 NOTE — Progress Notes (Signed)
[facility] Progress Note [place] TEAM 1 - Stepdown/ICU TEAM   Carmen [name] ZOX:[id number] DOB: 09/17/1933 DOA: 03/04/2012 PCP: Gwynneth Aliment, MD  Brief narrative: 78 year old woman history of diabetes, stroke, hypokalemia who presented via EMS with history of fall, confusion, hypoglycemia.  Was found to have profound hypokalemia.  All history obtained from granddaughter [place] at bedside. Patient lives with husband and brother.  Patient was in usual state of health when her granddaughter left her last night around 10 PM. No antecedent illness or complaints were noted. Patient was ambulating without difficulty and without assistant device. The morning of admission her granddaughter was called at approximately 10 AM report that the patient had been found on the floor approximately 5 AM by her husband and put back into bed. However the patient failed to get up and was noted to be confused and have left arm stiffness at which point the granddaughter was called. Granddaughter went to the house, noted the patient be confused, to have a contracted left arm which was new and left leg weakness. Right arm contracture is chronic. EMS noted blood sugar of 41. In the emergency department was noted be afebrile, markedly hypertensive, no hypoxia. Noted to have left arm contracture however was noted that her left leg did improve in strength while in the emergency department. Patient noted to be confused. Potassium was less than 2, blood sugar 55-96, CT head negative. EKG independently reviewed revealed sinus rhythm with left ventricular hypertrophy and repolarization abnormality, no acute changes seen.  Assessment/Plan:  Acute encephalopathy MRI can't be accomplished as patient has a pacemaker - CT scan unremarkable at time of admission - will need repeat CT scan in approximately 48 hours for stroke evaluation - carotid Dopplers have been ordered for stroke evaluation - given the speed with which the  patient has regained her baseline mental status I suspect hypoglycemia is to blame  Severe hypokalemia with history of same Potassium level slowly improving - magnesium is suboptimal will therefore supplement - continue high dose potassium replacement - must avoid further Lasix at this time  Mild elevation of CK MD Likely simple sequelae of fall - recheck in a.m.  Thrombocytopenia No evidence of spontaneous bleeding - unclear etiology - follow trend  Diabetes mellitus with severe hypoglycemia Likely due to Levemir therapy in the setting of poor oral intake - now with hyperglycemia on dextrose - initiate diet and discontinue dextrose - follow CBGs closely  History of CVA Chronic weakness right upper extremity  Hypertension with history of renal artery stenosis Status post left renal artery stent 2009 with followup angioplasty 2011 - blood pressure is currently modestly elevated - in setting of potential acute CVA we are practicing permissive hypertension for an additional 24 hours  Peripheral vascular disease Status post left carotid endarterectomy 1991, right subclavian artery stent 2010, stent to common iliac artery 2011  History of intermittent heart block status post pacemaker  Coronary artery disease Asymptomatic  History of cataracts Patient is scheduled for cataract surgery with Dr. Darel Hong [phone]) for this week - clearly this will need to be rescheduled - we will attempt to contact his office tomorrow  Tobacco abuse Counseled on need for tobacco cessation  Code Status: Full Family Communication: Spoke with family at bedside Disposition Plan: Remain in step down unit until potassium and CBG more stable  Consultants: None  Procedures: None  Antibiotics: None  DVT prophylaxis: SCDs only in setting of thrombocytopenia  HPI/Subjective: The patient is alert and conversant.  The family reports that she is "back to her normal".  She denies chest pain shortness of  breath fevers chills nausea or vomiting.   Objective: Blood pressure 159/70, pulse 60, temperature 98.1 F (36.7 C), temperature source Oral, resp. rate 16, height 5\' 4"  (1.626 m), weight 50.1 kg (110 lb 7.2 oz), SpO2 99.00%.  Intake/Output Summary (Last 24 hours) at 03/05/12 1200 Last data filed at 03/05/12 1100  Gross per 24 hour  Intake 3745.42 ml  Output    675 ml  Net 3070.42 ml     Exam: General: No acute respiratory distress Lungs: Clear to auscultation bilaterally without wheezes or crackles Cardiovascular: Regular rate and rhythm with late "squeaking" murmur Abdomen: Nontender, nondistended, soft, bowel sounds positive, no rebound, no ascites, no appreciable mass - abdominal bruit prominent at 2/6 Extremities: No significant cyanosis, clubbing, or edema bilateral lower extremities Neurologic:  Alert and oriented x4, cranial nerves II through XII intact bilaterally, 4+ over 5 strength bilateral upper and lower extremities with exception to right upper extremity where the patient has diminished movement of the right fingers and wrist with a slight flexion contracture at the elbow  Data Reviewed: Basic Metabolic Panel:  Lab 03/05/12 6578 03/04/12 2117 03/04/12 1414 03/04/12 1217 03/04/12 0904  NA 132* 133* 136 -- 138  K 2.3* 2.1* <2.0* -- <2.0*  CL 92* 91* 92* -- 91*  CO2 33* 34* 38* -- 38*  GLUCOSE 295* 277* 138* -- 96  BUN 18 19 18  -- 19  CREATININE 0.83 0.83 0.79 -- 0.90  CALCIUM 7.6* 7.8* 8.1* -- 9.0  MG 1.6 -- -- 1.7 --  PHOS -- -- -- -- --   CBC:  Lab 03/05/12 0153 03/04/12 0904  WBC 5.7 5.1  NEUTROABS -- 4.4  HGB 12.2 16.7*  HCT 35.9* 46.7*  MCV 97.0 96.3  PLT 86* 108*   Cardiac Enzymes:  Lab 03/04/12 0905  CKTOTAL 147  CKMB 5.0*  CKMBINDEX --  TROPONINI <0.30   CBG:  Lab 03/05/12 1142 03/05/12 0731 03/04/12 2213 03/04/12 1614 03/04/12 1202  GLUCAP 224* 340* 269* 198* 134*    Recent Results (from the past 240 hour(s))  MRSA PCR SCREENING      Status: Normal   Collection Time   03/04/12  1:30 PM      Component Value Range Status Comment   MRSA by PCR NEGATIVE  NEGATIVE Final      Studies:  Recent x-ray studies have been reviewed in detail by the Attending Physician  Scheduled Meds:  Reviewed in detail by the Attending Physician   Lonia Blood, MD [facility] Office  [phone] Pager [phone]  On-Call/Text Page:      [email or web address]      password [email or web address]  If 7PM-7AM, please contact night-coverage [email or web address] Password [email or web address] 03/05/2012, 12:00 PM   LOS: 1 day

## 2012-03-06 LAB — CK: Total CK: 130 U/L (ref 7–177)

## 2012-03-06 LAB — CBC
Hemoglobin: 11.6 g/dL — ABNORMAL LOW (ref 12.0–15.0)
MCH: 33.3 pg (ref 26.0–34.0)
MCHC: 34.3 g/dL (ref 30.0–36.0)

## 2012-03-06 LAB — GLUCOSE, CAPILLARY
Glucose-Capillary: 155 mg/dL — ABNORMAL HIGH (ref 70–99)
Glucose-Capillary: 163 mg/dL — ABNORMAL HIGH (ref 70–99)

## 2012-03-06 LAB — BASIC METABOLIC PANEL
BUN: 20 mg/dL (ref 6–23)
GFR calc non Af Amer: 57 mL/min — ABNORMAL LOW (ref >90)
Glucose, Bld: 110 mg/dL — ABNORMAL HIGH (ref 70–99)
Potassium: 3.1 mEq/L — ABNORMAL LOW (ref 3.5–5.1)

## 2012-03-06 MED ORDER — LOSARTAN POTASSIUM 50 MG PO TABS
50.0000 mg | ORAL_TABLET | Freq: Every day | ORAL | Status: DC
  Administered 2012-03-06 – 2012-03-07 (×2): 50 mg via ORAL
  Filled 2012-03-06 (×2): qty 1

## 2012-03-06 MED ORDER — ASPIRIN 81 MG PO CHEW: 81.0000 mg | CHEWABLE_TABLET | Freq: Every day | ORAL | Status: DC

## 2012-03-06 MED ORDER — LOSARTAN POTASSIUM 50 MG PO TABS: 50.0000 mg | ORAL_TABLET | Freq: Every day | ORAL | Status: DC

## 2012-03-06 MED ORDER — WHITE PETROLATUM GEL
Status: AC
  Administered 2012-03-06: 12:00:00
  Filled 2012-03-06: qty 5

## 2012-03-06 MED ORDER — HYDRALAZINE HCL 50 MG PO TABS
50.0000 mg | ORAL_TABLET | Freq: Four times a day (QID) | ORAL | Status: DC
  Administered 2012-03-06 – 2012-03-08 (×7): 50 mg via ORAL
  Filled 2012-03-06 (×9): qty 1

## 2012-03-06 NOTE — Progress Notes (Signed)
Speech Language Pathology Dysphagia Treatment Patient Details Name: Carmen [name] MRN: [id number] DOB: [birth year] Today's Date: 03/06/2012 Time: 4098-1191 SLP Time Calculation (min): 23 min  Assessment / Plan / Recommendation Clinical Impression  Pt mentation now appropriate, functional consumption of thin liquids and regular solids without dentures. Pt and dtr state she never wears her dentures. Continue current diet, No SLP f/u needed.     Diet Recommendation  Continue with Current Diet: Regular;Thin liquid    SLP Plan All goals met   Pertinent Vitals/Pain NA   Swallowing Goals  SLP Swallowing Goals Patient will consume recommended diet without observed clinical signs of aspiration with: Supervision/safety Swallow Study Goal #1 - Progress: Met Patient will utilize recommended strategies during swallow to increase swallowing safety with: Supervision/safety Swallow Study Goal #2 - Progress: Met  General Temperature Spikes Noted: No Respiratory Status: Room air Behavior/Cognition: Alert;Cooperative;Pleasant mood Oral Cavity - Dentition: Edentulous Patient Positioning: Upright in chair  Oral Cavity - Oral Hygiene Does patient have any of the following "at risk" factors?: None of the above   Dysphagia Treatment Treatment focused on: Skilled observation of diet tolerance Treatment Methods/Modalities: Skilled observation Patient observed directly with PO's: Yes Type of PO's observed: Regular;Thin liquids Feeding: Able to feed self Liquids provided via: Cup;Straw   GO     Kwane Rohl, Riley Nearing 03/06/2012, 3:00 PM

## 2012-03-06 NOTE — Progress Notes (Signed)
UR COMPLETED  

## 2012-03-06 NOTE — Progress Notes (Signed)
Physical Therapy Treatment Patient Details Name: Carmen [name] MRN: [id number] DOB: June 22, 1933 Today's Date: 03/06/2012 Time: 1325-1350 PT Time Calculation (min): 25 min  PT Assessment / Plan / Recommendation Comments on Treatment Session  Pt showing overall mobility improvement with decrease assistance needed.  Encouraged pt to use RW.  Pt able to improve overall gait with use of RW to maintain balance.  Pt will benefit from HHPT at d/c with supervision.    Follow Up Recommendations  Home health PT;Supervision for mobility/OOB     Equipment Recommendations  None recommended by PT    Frequency Min 4X/week   Plan Discharge plan remains appropriate;Frequency remains appropriate    Precautions / Restrictions Precautions Precautions: Fall   Pertinent Vitals/Pain No c/o pain    Mobility  Bed Mobility Bed Mobility: Not assessed Transfers Transfers: Sit to Stand;Stand to Sit Sit to Stand: 4: Min guard;From chair/3-in-1 Stand to Sit: 4: Min guard;To chair/3-in-1 Details for Transfer Assistance: Minguard for safety with cues for hand placement.   Ambulation/Gait Ambulation/Gait Assistance: 4: Min assist Ambulation Distance (Feet): 200 Feet Assistive device: Rolling walker Ambulation/Gait Assistance Details: occasional min (A) to manage RW.  Cues for upright posture and forward gaze Gait Pattern: Step-through pattern;Decreased step length - right;Shuffle Gait velocity: pt able to increase gait speed with verbal cues General Gait Details: encouraged increase right step length Stairs: No Modified Rankin (Stroke Patients Only) Pre-Morbid Rankin Score: No symptoms Modified Rankin: Moderate disability    Exercises General Exercises - Lower Extremity Mini-Sqauts: Strengthening;Both;5 reps   PT Diagnosis:    PT Problem List:   PT Treatment Interventions:     PT Goals Acute Rehab PT Goals PT Goal Formulation: With patient Time For Goal Achievement: 03/12/12 Potential to  Achieve Goals: Good Pt will go Sit to Stand: with modified independence PT Goal: Sit to Stand - Progress: Progressing toward goal Pt will go Stand to Sit: with modified independence PT Goal: Stand to Sit - Progress: Progressing toward goal Pt will Transfer Bed to Chair/Chair to Bed: with supervision PT Transfer Goal: Bed to Chair/Chair to Bed - Progress: Progressing toward goal Pt will Ambulate: 51 - 150 feet;with supervision;with least restrictive assistive device PT Goal: Ambulate - Progress: Progressing toward goal  Visit Information  Last PT Received On: 03/06/12 Assistance Needed: +1    Subjective Data  Subjective: "I don't use a walker." Patient Stated Goal: To go home   Cognition  Overall Cognitive Status: Appears within functional limits for tasks assessed/performed Arousal/Alertness: Awake/alert Orientation Level: Appears intact for tasks assessed Behavior During Session: [facility] for tasks performed    Balance  Balance Balance Assessed: Yes Dynamic Standing Balance Dynamic Standing - Balance Support: No upper extremity supported Dynamic Standing - Level of Assistance: 5: Stand by assistance Dynamic Standing - Balance Activities: Forward lean/weight shifting;Reaching for objects High Level Balance High Level Balance Activites: Side stepping High Level Balance Comments: Pt able to march x 4 for 15 seconds wiith minguard (A) for safety  End of Session PT - End of Session Equipment Utilized During Treatment: Gait belt Activity Tolerance: Patient tolerated treatment well Patient left: in chair;with call bell/phone within reach;with family/visitor present Nurse Communication: Mobility status   GP     Tommey Barret 03/06/2012, 2:04 PM Jake Shark, PT DPT [phone]

## 2012-03-06 NOTE — Progress Notes (Signed)
INITIAL NUTRITION ASSESSMENT  DOCUMENTATION CODES Per approved criteria  -Severe malnutrition in the context of chronic illness   INTERVENTION: Magic Cups on all meal trays to maximize oral intake and prevent further weight loss.  NUTRITION DIAGNOSIS:  Malnutrition related to poor appetite and inadequate oral intake as evidenced by 11% weight loss in 6 months and intake </= 75% of estimated energy expenditure for >/= 1 month.   Goal: Intake to meet >90% of estimated nutrition needs.  Monitor:  PO intake, labs, weight trend.  Reason for Assessment: MST=3  78 y.o. female  Admitting Dx: Hypokalemia  ASSESSMENT: Patient reports that she is eating well now, consuming > 75% of all meals.  PTA was eating poorly due to poor appetite.  Likes magic cups; will send on meal trays.  SLP following for dysphagia on admission.  S/P re-eval yesterday, SLP recommended regular diet with thin liquids.  Spoke with Junious Silk, NP, okay for RD to order regular diet for patient.  Pt meets criteria for severe MALNUTRITION in the context of chronic illness as evidenced by 11% weight loss in 6 months and intake </= 75% of estimated energy expenditure for >/= 1 month.  Height: Ht Readings from Last 1 Encounters:  03/04/12 5\' 4"  (1.626 m)    Weight: Wt Readings from Last 1 Encounters:  03/04/12 110 lb 7.2 oz (50.1 kg)    Ideal Body Weight: 54.5 kg  % Ideal Body Weight: 92%  Wt Readings from Last 10 Encounters:  03/04/12 110 lb 7.2 oz (50.1 kg)  11/13/11 117 lb (53.071 kg)  10/17/11 114 lb 6.7 oz (51.9 kg)  07/31/11 123 lb 7.3 oz (56 kg)  07/31/11 123 lb 7.3 oz (56 kg)  07/06/11 147 lb (66.679 kg)  07/06/11 147 lb (66.679 kg)  06/29/11 127 lb 6.8 oz (57.8 kg)  06/29/11 127 lb 6.8 oz (57.8 kg)  05/30/11 143 lb 8.3 oz (65.1 kg)    Usual Body Weight: 123 lb   % Usual Body Weight: 89% (11% weight loss in 6 months)  BMI:  Body mass index is 18.96 kg/(m^2).  Estimated Nutritional  Needs: Kcal: 1400-1500 Protein: 70-80 gm Fluid: 1.4-1.5 L  Skin: no problems noted  Diet Order: Dysphagia 2 with thin liquids  EDUCATION NEEDS: -No education needs identified at this time   Intake/Output Summary (Last 24 hours) at 03/06/12 1145 Last data filed at 03/06/12 1000  Gross per 24 hour  Intake   1170 ml  Output    500 ml  Net    670 ml    Labs:   Lab 03/06/12 0420 03/05/12 1110 03/05/12 0153 03/04/12 1217  NA 136 136 132* --  K 3.1* 2.3* 2.3* --  CL 100 97 92* --  CO2 30 31 33* --  BUN 20 19 18  --  CREATININE 0.94 0.77 0.83 --  CALCIUM 8.3* 7.7* 7.6* --  MG 1.9 -- 1.6 1.7  PHOS -- -- -- --  GLUCOSE 110* 286* 295* --    CBG (last 3)   Basename 03/06/12 0805 03/05/12 2207 03/05/12 1712  GLUCAP 155* 279* 217*    Scheduled Meds:   . aspirin  81 mg Oral Daily  . cilostazol  100 mg Oral BID  . cloNIDine  0.3 mg Transdermal Weekly  . insulin aspart  0-15 Units Subcutaneous TID WC  . insulin aspart  0-5 Units Subcutaneous QHS  . metoprolol  100 mg Oral BID  . omega-3 acid ethyl esters  1 g Oral BID  .  potassium chloride  40 mEq Oral TID    Continuous Infusions:   . 0.9 % NaCl with KCl 40 mEq / L 50 mL/hr at 03/06/12 1000    Past Medical History  Diagnosis Date  . Diabetes mellitus   . Hypertension   . Coronary artery disease   . Renal disorder   . Herniated lumbar intervertebral disc   . Cataract     cataract surgery scheduled for 03/31/11, 2 - left eye, 1 - right eye  . Renovascular hypertension      s/p left renal artery stent 12/2007.   S/P balloon angioplasty on 02/16/10 for ISR, BP was  controlled well since then.     . Claudication in peripheral vascular disease     02/16/10: Left CIA 9.0x28 Omnilink and REIA 8.0x40 seff expanding Zilver.   right subclavian artery stent 03/18/2008,  . S/P carotid endarterectomy      left carotid endarterectomy  1991; Stroke and TIA in 1999 with right sided weakness, now with residual right arm weakness   . Stroke     TIA  . Peripheral vascular disease   . Anxiety     Past Surgical History  Procedure Date  . Appendectomy   . Abdominal hysterectomy   . Ureteral stent placement   . Carotid endarterectomy   . Laminectomy   . Back surgery   . Colonoscopy 07/30/2011    Procedure: COLONOSCOPY;  Surgeon: Louis Meckel, MD;  Location: [facility] ENDOSCOPY;  Service: Endoscopy;  Laterality: N/A;  gi bleed  . Esophagogastroduodenoscopy 07/30/2011    Procedure: ESOPHAGOGASTRODUODENOSCOPY (EGD);  Surgeon: Louis Meckel, MD;  Location: [facility] ENDOSCOPY;  Service: Endoscopy;  Laterality: N/A;  . Pacemaker insertion     Joaquin Courts, RD, LDN, CNSC Pager# [phone] After Hours Pager# [phone]

## 2012-03-06 NOTE — Progress Notes (Signed)
Patient being transferred to 4 north per MD order. Report called to French Ana, Charity fundraiser. Patient will transfer in wheel chair.

## 2012-03-06 NOTE — Progress Notes (Signed)
[facility] Progress Note [place] TEAM 1 - Stepdown/ICU TEAM   SABLE KNOLES YNW:[id number] DOB: 10-17-1933 DOA: 03/04/2012 PCP: Gwynneth Aliment, MD  Brief narrative: 78 year old woman history of diabetes, stroke, hypokalemia who presented via EMS with history of fall, confusion, hypoglycemia.  Was found to have profound hypokalemia.  All history obtained from granddaughter [place] at bedside. Patient lives with husband and brother.  Patient was in usual state of health when her granddaughter left her last night around 10 PM. No antecedent illness or complaints were noted. Patient was ambulating without difficulty and without assistant device. The morning of admission her granddaughter was called at approximately 10 AM report that the patient had been found on the floor approximately 5 AM by her husband and put back into bed. However the patient failed to get up and was noted to be confused and have left arm stiffness at which point the granddaughter was called. Granddaughter went to the house, noted the patient be confused, to have a contracted left arm which was new and left leg weakness. Right arm contracture is chronic. EMS noted blood sugar of 41. In the emergency department was noted be afebrile, markedly hypertensive, no hypoxia. Noted to have left arm contracture however was noted that her left leg did improve in strength while in the emergency department. Patient noted to be confused. Potassium was less than 2, blood sugar 55-96, CT head negative. EKG independently reviewed revealed sinus rhythm with left ventricular hypertrophy and repolarization abnormality, no acute changes seen.  Assessment/Plan:  Acute encephalopathy MRI can't be accomplished as patient has a pacemaker - CT scan unremarkable at time of admission - will need repeat CT scan in approximately 48 hours for stroke evaluation - preliminary findings on the carotid Dopplers show no evidence of significant stenosis - given  the speed with which the patient has regained her baseline mental status I suspect hypoglycemia is to blame - she was also quite dehydrated with a hgb of 16 at presentation: now down to 11.6 after hydration - passed swallow evaluation  Severe hypokalemia with history of same Potassium level slowly improving - magnesium is suboptimal will therefore supplement - continue high dose potassium replacement - must avoid further Lasix at this time - may need work up for Gitelman's syndrome  Mild elevation of CK / dehydration Likely simple sequelae of fall - trend is downward- suspect dehydration precipitated by pre admit hyperglycemia in conjunction with Lasix- suspect once dehydrated developed hypotension and altered mentation resulting in poor intake, decreased clearance diabetes meds and hypoglycemia  Thrombocytopenia No evidence of spontaneous bleeding - unclear etiology - follow trend - review of old labs reveals this a chronic recurring problem  Diabetes mellitus with severe hypoglycemia Likely due to Levemir therapy in the setting of poor oral intake - now with hyperglycemia on dextrose - initiate diet and discontinue dextrose - follow CBGs closely - HgbA1c 11.6 and was 16.2 in September - pt may benefit from closer outpatient follow up  History of CVA Chronic weakness right upper extremity - so far no evidence of CVA this admit (see above)  Hypertension with history of renal artery stenosis Status post left renal artery stent 2009 with followup angioplasty 2011 - will begin to titrate BP meds to gain gradual control of HTN   Peripheral vascular disease Status post left carotid endarterectomy 1991, right subclavian artery stent 2010, stent to common iliac artery 2011  History of intermittent heart block status post pacemaker  Coronary artery disease Asymptomatic  History of cataracts Patient is scheduled for cataract surgery with Dr. Darel Hong [phone]) for this week - clearly this will  need to be rescheduled - I contacted the office 12/30 - needs to follow up with PCP first for medical clearance then can reschedule - if has surgery before Feb 17th will NOT need to be seen by Dr. Darel Hong in office pre-op  Tobacco abuse Counseled on need for tobacco cessation  Code Status: Full Family Communication: Spoke with family at bedside Disposition Plan: Transfer to telemetry  Consultants: None  Procedures: None  Antibiotics: None  DVT prophylaxis: SCDs only in setting of thrombocytopenia  HPI/Subjective: Patient is alert and denies chest pain, shortness of breath or weakness. States she would really like some bread to eat. States she makes her own bread at home.   Objective: Blood pressure 179/72, pulse 65, temperature 98.3 F (36.8 C), temperature source Oral, resp. rate 17, height 5\' 4"  (1.626 m), weight 50.1 kg (110 lb 7.2 oz), SpO2 96.00%.  Intake/Output Summary (Last 24 hours) at 03/06/12 1419 Last data filed at 03/06/12 1300  Gross per 24 hour  Intake   1230 ml  Output    500 ml  Net    730 ml     Exam: General: No acute respiratory distress Lungs: Clear to auscultation bilaterally without wheezes or crackles Cardiovascular: Regular rate and rhythm with late "squeaking" murmur Abdomen: Nontender, nondistended, soft, bowel sounds positive, no rebound, no ascites, no appreciable mass - abdominal bruit prominent at 2/6 Extremities: No significant cyanosis, clubbing, or edema bilateral lower extremities Neurologic:  Alert and oriented x4, cranial nerves II through XII intact bilaterally, 4+ over 5 strength bilateral upper and lower extremities with exception to right upper extremity where the patient has diminished movement of the right fingers and wrist with a slight flexion contracture at elbow  Data Reviewed: Basic Metabolic Panel:  Lab 03/06/12 8295 03/05/12 1110 03/05/12 0153 03/04/12 2117 03/04/12 1414 03/04/12 1217  NA 136 136 132* 133* 136 --  K  3.1* 2.3* 2.3* 2.1* <2.0* --  CL 100 97 92* 91* 92* --  CO2 30 31 33* 34* 38* --  GLUCOSE 110* 286* 295* 277* 138* --  BUN 20 19 18 19 18  --  CREATININE 0.94 0.77 0.83 0.83 0.79 --  CALCIUM 8.3* 7.7* 7.6* 7.8* 8.1* --  MG 1.9 -- 1.6 -- -- 1.7  PHOS -- -- -- -- -- --   CBC:  Lab 03/06/12 0420 03/05/12 0153 03/04/12 0904  WBC 3.7* 5.7 5.1  NEUTROABS -- -- 4.4  HGB 11.6* 12.2 16.7*  HCT 33.8* 35.9* 46.7*  MCV 97.1 97.0 96.3  PLT 94* 86* 108*   Cardiac Enzymes:  Lab 03/06/12 0420 03/04/12 0905  CKTOTAL 130 147  CKMB -- 5.0*  CKMBINDEX -- --  TROPONINI -- <0.30   CBG:  Lab 03/06/12 1153 03/06/12 0805 03/05/12 2207 03/05/12 1712 03/05/12 1142  GLUCAP 163* 155* 279* 217* 224*    Recent Results (from the past 240 hour(s))  MRSA PCR SCREENING     Status: Normal   Collection Time   03/04/12  1:30 PM      Component Value Range Status Comment   MRSA by PCR NEGATIVE  NEGATIVE Final      Studies:  Recent x-ray studies have been reviewed in detail by the Attending Physician  Scheduled Meds:  Reviewed in detail by the Attending Physician   Junious Silk, ANP [facility] Office  [phone] Pager [phone]  On-Call/Text Page:  [email or web address]      password [email or web address]  If 7PM-7AM, please contact night-coverage [email or web address] Password [email or web address] 03/06/2012, 2:19 PM   LOS: 2 days   I have personally examined this patient and reviewed the entire database. I have reviewed the above note, made any necessary editorial changes, and agree with its content.  Lonia Blood, MD [facility]

## 2012-03-07 ENCOUNTER — Inpatient Hospital Stay (HOSPITAL_COMMUNITY): Payer: Medicare Other

## 2012-03-07 ENCOUNTER — Encounter (HOSPITAL_COMMUNITY): Payer: Self-pay | Admitting: Radiology

## 2012-03-07 DIAGNOSIS — I798 Other disorders of arteries, arterioles and capillaries in diseases classified elsewhere: Secondary | ICD-10-CM

## 2012-03-07 DIAGNOSIS — I10 Essential (primary) hypertension: Secondary | ICD-10-CM

## 2012-03-07 DIAGNOSIS — D696 Thrombocytopenia, unspecified: Secondary | ICD-10-CM

## 2012-03-07 DIAGNOSIS — E43 Unspecified severe protein-calorie malnutrition: Secondary | ICD-10-CM

## 2012-03-07 DIAGNOSIS — E1159 Type 2 diabetes mellitus with other circulatory complications: Secondary | ICD-10-CM

## 2012-03-07 LAB — CBC
HCT: 36.9 % (ref 36.0–46.0)
Hemoglobin: 12.9 g/dL (ref 12.0–15.0)
MCH: 34.2 pg — ABNORMAL HIGH (ref 26.0–34.0)
MCV: 97.9 fL (ref 78.0–100.0)
RBC: 3.77 MIL/uL — ABNORMAL LOW (ref 3.87–5.11)

## 2012-03-07 LAB — GLUCOSE, CAPILLARY
Glucose-Capillary: 173 mg/dL — ABNORMAL HIGH (ref 70–99)
Glucose-Capillary: 339 mg/dL — ABNORMAL HIGH (ref 70–99)

## 2012-03-07 LAB — BASIC METABOLIC PANEL
CO2: 26 mEq/L (ref 19–32)
Glucose, Bld: 182 mg/dL — ABNORMAL HIGH (ref 70–99)
Potassium: 4.3 mEq/L (ref 3.5–5.1)
Sodium: 135 mEq/L (ref 135–145)

## 2012-03-07 MED ORDER — INSULIN DETEMIR 100 UNIT/ML ~~LOC~~ SOLN
5.0000 [IU] | Freq: Every day | SUBCUTANEOUS | Status: DC
  Administered 2012-03-08: 5 [IU] via SUBCUTANEOUS
  Filled 2012-03-07 (×2): qty 10

## 2012-03-07 MED ORDER — LOSARTAN POTASSIUM 50 MG PO TABS
100.0000 mg | ORAL_TABLET | Freq: Every day | ORAL | Status: DC
  Administered 2012-03-08: 100 mg via ORAL
  Filled 2012-03-07: qty 2

## 2012-03-07 NOTE — Progress Notes (Signed)
[facility] PROGRESS NOTE  ALDORA PERMAN ZOX:[id number] DOB: 1933/07/16 DOA: 03/04/2012 PCP: Gwynneth Aliment, MD  Assessment/Plan: 1. Acute encephalopathy: Resolved. Presumed etiology is profound hypokalemia and hypoglycemia. CT of head on admission was negative. No MRI with history of pacemaker. Repeat CT head is pending today. However stroke is doubted. Given the speed of the patient's recovery dehydration, hypoglycemia and hypokalemia are suspected etiologies. CT head is negative would pursue no further evaluation. CT scan unremarkable at time of admission. Carotid Dopplers unremarkable. 2-D echocardiogram from January 2013 with normal ejection fraction, mild pulmonary hypertension. No comment on diastolic function. 2. Profound hypokalemia: Resolved at this point after 72 hours of aggressive supplementation. Most likely secondary to increased Lasix dose, may also have been taking chlorthalidone. No lower extremity edema now. Echocardiogram from earlier this year noted above. No indication to resume Lasix at this point. Repeat potassium in the morning. 3. Thrombocytopenia: Stable. Seen intermittently since may of this year.  Etiology unclear. Followup as an outpatient.  4. Diabetes mellitus with severe hypoglycemia on admission: Hemoglobin A1c 11.1. Likely due to Levemir therapy in the setting of poor oral intake. Now stable on sliding scale insulin. Restart low-dose Levemir once daily. 5. History of CVA: Chronic weakness right upper extremity.  6. Hypertension with history of renal artery stenosis: Poor control. Continue clonidine, hydralazine, metoprolol. Increase losartan. Status post left renal artery stent 2009 with followup angioplasty 2011. 7. History of intermittent heart block status post pacemaker  8. History of cataracts: Patient was scheduled for cataract surgery with Dr. Darel Hong [phone]). Needs to follow up with PCP first for medical clearance then can reschedule. 9. Severe  malnutrition in context of chronic illness  Monitor potassium, hypertension, blood sugars. If stable anticipate discharge 1/1.  Code Status: Full code Family Communication: None present Disposition Plan: Home with home health, likely 1/1  Brendia Sacks, MD  [facility] Team 4  Pager [phone] If 7PM-7AM, please contact night-coverage at [email or web address], password [facility] 03/07/2012, 9:44 AM  LOS: 3 days   Brief narrative: 78 year old woman history of diabetes, stroke, hypokalemia she presented via EMS with history of fall, confusion, hypoglycemia. An EGD was found to have profound hypokalemia. 78 year old woman history of diabetes, stroke, hypokalemia she presented via EMS with history of fall, confusion, hypoglycemia. An EGD was found to have profound hypokalemia.  Consultants:  Speech therapy: Regular diet, thin liquids.  Physical therapy: Home health  Occupational therapy: Home health.  Procedures:  Carotid Dopplers:There is <40% right ICA stenosis. There is no significant left ICA stenosis. Bilateral vertebral artery flow is antegrade.   HPI/Subjective: Poor control of systolic hypertension. Denies complaints. Feels much better.  Objective: Filed Vitals:   03/06/12 1506 03/06/12 2122 03/07/12 0205 03/07/12 0641  BP: 178/62 175/69 152/51 183/64  Pulse: 63 60 63 60  Temp: 98.2 F (36.8 C) 98 F (36.7 C) 98.1 F (36.7 C) 98 F (36.7 C)  TempSrc: Oral Oral Oral Oral  Resp: 20 20 20 20   Height:      Weight:      SpO2: 98% 98% 95% 96%    Intake/Output Summary (Last 24 hours) at 03/07/12 0944 Last data filed at 03/07/12 0920  Gross per 24 hour  Intake   1976 ml  Output      0 ml  Net   1976 ml   Filed Weights   03/04/12 1251  Weight: 50.1 kg (110 lb 7.2 oz)    Exam:  General:  Appears calm and comfortable, sitting in  chair Eyes:  Pupils, irises, lids appear grossly normal. ENT: grossly normal hearing, lips  Cardiovascular: RRR, no m/r/g. No LE  edema. Telemetry: SR, no arrhythmias  Respiratory: CTA bilaterally, no w/r/r. Normal respiratory effort. Musculoskeletal: grossly normal tone, strength left upper and lower extremity. Chronic right upper extremity weakness. Psychiatric: grossly normal mood and affect, speech fluent and appropriate  Data Reviewed: Basic Metabolic Panel:  Lab 03/07/12 1324 03/06/12 0420 03/05/12 1110 03/05/12 0153 03/04/12 2117 03/04/12 1217  NA 135 136 136 132* 133* --  K 4.3 3.1* 2.3* 2.3* 2.1* --  CL 103 100 97 92* 91* --  CO2 26 30 31  33* 34* --  GLUCOSE 182* 110* 286* 295* 277* --  BUN 20 20 19 18 19  --  CREATININE 0.87 0.94 0.77 0.83 0.83 --  CALCIUM 8.2* 8.3* 7.7* 7.6* 7.8* --  MG -- 1.9 -- 1.6 -- 1.7  PHOS -- -- -- -- -- --   CBC:  Lab 03/07/12 0605 03/06/12 0420 03/05/12 0153 03/04/12 0904  WBC 3.5* 3.7* 5.7 5.1  NEUTROABS -- -- -- 4.4  HGB 12.9 11.6* 12.2 16.7*  HCT 36.9 33.8* 35.9* 46.7*  MCV 97.9 97.1 97.0 96.3  PLT 96* 94* 86* 108*   Cardiac Enzymes:  Lab 03/06/12 0420 03/04/12 0905  CKTOTAL 130 147  CKMB -- 5.0*  CKMBINDEX -- --  TROPONINI -- <0.30   CBG:  Lab 03/07/12 0644 03/06/12 2210 03/06/12 1634 03/06/12 1153 03/06/12 0805  GLUCAP 173* 168* 193* 163* 155*    Recent Results (from the past 240 hour(s))  MRSA PCR SCREENING     Status: Normal   Collection Time   03/04/12  1:30 PM      Component Value Range Status Comment   MRSA by PCR NEGATIVE  NEGATIVE Final      Studies: No results found.  Scheduled Meds:   . aspirin  81 mg Oral Daily  . cilostazol  100 mg Oral BID  . cloNIDine  0.3 mg Transdermal Weekly  . hydrALAZINE  50 mg Oral QID  . insulin aspart  0-15 Units Subcutaneous TID WC  . insulin aspart  0-5 Units Subcutaneous QHS  . losartan  50 mg Oral Daily  . metoprolol  100 mg Oral BID  . omega-3 acid ethyl esters  1 g Oral BID  . potassium chloride  40 mEq Oral TID   Continuous Infusions:   . 0.9 % NaCl with KCl 40 mEq / L 20 mL/hr (03/06/12  1952)    Principal Problem:  *Hypokalemia Active Problems:  HTN (hypertension), malignant  DM (diabetes mellitus), type 2, uncontrolled, periph vascular complic  History of CVA (cerebrovascular accident) with associated mild right upper extremity hemiplegia  Cardiac pacemaker for history of intermittent heart block  CVA (cerebral infarction)  Acute encephalopathy     Brendia Sacks, MD  [facility] Team 4  Pager [phone] If 7PM-7AM, please contact night-coverage at [email or web address], password [facility] 03/07/2012, 9:44 AM  LOS: 3 days   Time spent: 30 minutes

## 2012-03-07 NOTE — Progress Notes (Addendum)
Inpatient Diabetes Program Recommendations  AACE/ADA: New Consensus Statement on Inpatient Glycemic Control (2013)  Target Ranges:  Prepandial:   less than 140 mg/dL      Peak postprandial:   less than 180 mg/dL (1-2 hours)      Critically ill patients:  140 - 180 mg/dL   Reason for Visit: Consult for diabetes control and admitted with hypoglycemia Noted patient's home regimen of Levemir 10 units bid. Most probably ordered to cover basal and po intake needs (which it is not intended to do).  Pt would do well with 5-10 units Levemir once a day and could use either a sulfonylurea, i.e. Amaryl 2-4 mg  daily or Novolog meal coverage of 3 units tidwc (if pt not making sufficient meal time insulin).   Note: Thank you, Lenor Coffin, RN, CNS, Diabetes Coordinator [phone])   AD-Actually if patient truly need insulin meal coverage as well as basal, pt could use 70/30, 10 units bid wc to start. (May need as much as 15 units bid).

## 2012-03-07 NOTE — Progress Notes (Signed)
Occupational Therapy Treatment Patient Details Name: Carmen [name] MRN: [id number] DOB: 06/23/1933 Today's Date: 03/07/2012 Time: 7829-5621 OT Time Calculation (min): 15 min  OT Assessment / Plan / Recommendation Comments on Treatment Session      Follow Up Recommendations  Home health OT      Equipment Recommendations  None recommended by OT       Frequency Min 2X/week      Precautions / Restrictions Precautions Precautions: Fall Restrictions Weight Bearing Restrictions: No       ADL  Grooming: Performed;Wash/dry face;Supervision/safety Where Assessed - Grooming: Unsupported standing Toilet Transfer: Research scientist (life sciences) Method: Sit to Barista: Comfort height toilet Toileting - Clothing Manipulation and Hygiene: Performed;Supervision/safety Where Assessed - Engineer, mining and Hygiene: Standing      OT Goals ADL Goals ADL Goal: Grooming - Progress: Met ADL Goal: Statistician - Progress: Met ADL Goal: Toileting - Clothing Manipulation - Progress: Met ADL Goal: Toileting - Hygiene - Progress: Met  Visit Information  Last OT Received On: 03/07/12 Assistance Needed: +1    Subjective Data  Subjective: My daugther and husband will help me out at home      Cognition  Overall Cognitive Status: Appears within functional limits for tasks assessed/performed Area of Impairment: Safety/judgement Arousal/Alertness: Awake/alert Orientation Level: Appears intact for tasks assessed Behavior During Session: [facility] for tasks performed Safety/Judgement: Decreased awareness of need for assistance (pt feels she is safe to amb without RW ) Safety/Judgement - Other Comments: pt with no LOB when amb without RW however does have increased steadiness with RW    Mobility  Shoulder Instructions Bed Mobility Bed Mobility: Not assessed Transfers Sit to Stand: 5: Supervision;With upper extremity assist;From  chair/3-in-1 Stand to Sit: 5: Supervision;With upper extremity assist;With armrests;To chair/3-in-1 Details for Transfer Assistance: pt demo'd safe technique          Balance Dynamic Gait Index Level Surface: Mild Impairment Change in Gait Speed: Mild Impairment Gait with Horizontal Head Turns: Mild Impairment Gait with Vertical Head Turns: Mild Impairment Gait and Pivot Turn: Mild Impairment Step Over Obstacle: Mild Impairment Step Around Obstacles: Mild Impairment Steps: Moderate Impairment Total Score: 15    End of Session OT - End of Session Equipment Utilized During Treatment: Gait belt Activity Tolerance: Patient tolerated treatment well Patient left: in chair;with call bell/phone within reach;with family/visitor present Nurse Communication: Mobility status;Other (comment) (BP increased during bed mobility)  GO     Evans Levee, [place] Kung 03/07/2012, 2:31 PM

## 2012-03-07 NOTE — Progress Notes (Signed)
   CARE MANAGEMENT NOTE 03/07/2012  Patient:  Carmen [name], Carmen [name]   Account Number:  [id number]  Date Initiated:  03/07/2012  Documentation initiated by:  [facility]  Subjective/Objective Assessment:   Hypokalemia, Hypoglycemia     Action/Plan:   HH PT/OT, DME   Anticipated DC Date:  03/08/2012   Anticipated DC Plan:  HOME W HOME HEALTH SERVICES      DC Planning Services  CM consult      [facility] Choice  HOME HEALTH   Choice offered to / List presented to:  [name]   DME arranged  HOSPITAL BED      DME agency  [facility]     HH arranged  HH-2 PT  HH-3 OT      [facility] agency  [facility]   Status of service:  Completed, signed off Medicare Important Message given?   (If response is "NO", the following Medicare IM given date fields will be blank) Date Medicare IM given:   Date Additional Medicare IM given:    Discharge Disposition:  HOME W HOME HEALTH SERVICES  Per UR Regulation:    If discussed at Long Length of Stay Meetings, dates discussed:    Comments:  03/07/2012 1530 NCM spoke to pt and explained the hospital bed may not be covered if she does not qualify for DME. States she still wants to run her benefits for DME. Requested [facility] for [facility]. States she had them in the past. [facility] contact info placed on D/c instructions. NCM contacted [facility] for [facility] and DME for scheduled d/c home tomorrow. Isidoro Donning RN CCM Case Mgmt phone [phone]

## 2012-03-07 NOTE — Progress Notes (Signed)
   CARE MANAGEMENT NOTE 03/07/2012  Patient:  Carmen [name], Carmen [name]   Account Number:  [id number]  Date Initiated:  03/07/2012  Documentation initiated by:  [facility]  Subjective/Objective Assessment:   Hypokalemia, Hypoglycemia     Action/Plan:   HH PT/OT, DME   Anticipated DC Date:  03/08/2012   Anticipated DC Plan:  HOME W HOME HEALTH SERVICES      DC Planning Services  CM consult      [facility] Choice  HOME HEALTH   Choice offered to / List presented to:  [name]   DME arranged  HOSPITAL BED      DME agency  [facility]     HH arranged  HH-2 PT  HH-3 OT      [facility] agency  [facility]   Status of service:  Completed, signed off Medicare Important Message given?   (If response is "NO", the following Medicare IM given date fields will be blank) Date Medicare IM given:   Date Additional Medicare IM given:    Discharge Disposition:  HOME W HOME HEALTH SERVICES  Per UR Regulation:    If discussed at Long Length of Stay Meetings, dates discussed:    Comments:  03/07/2012 1620 NCM spoke to attending MD about hospital bed. Will have HH PT complete HSE and follow up with PCP for documentation that qualify for hospital bed at home. NCM contacted [facility] to make aware. NCM did follow up with pt in regards to hospital bed and possible purchase of side rails or modify her bed at home. Explained to pt that [facility] PT and OT will determine her need for hospital bed and any safety modifications for home when they eval and tx her at home. And call her PCP for order of hospital bed. Isidoro Donning RN CCM Case Mgmt phone [phone]  03/07/2012 1530 NCM spoke to pt and explained the hospital bed may not be covered if she does not qualify for DME. States she still wants to run her benefits for DME. Requested [facility]. States she had them in the past. [facility] contact info placed on D/c instructions. NCM contacted [facility] for [facility] and DME for scheduled d/c home tomorrow. Isidoro Donning RN CCM Case Mgmt phone [phone]

## 2012-03-07 NOTE — Progress Notes (Signed)
Physical Therapy Treatment Patient Details Name: Carmen [name] MRN: [id number] DOB: Sep 10, 1933 Today's Date: 03/07/2012 Time: 4098-1191 PT Time Calculation (min): 16 min  PT Assessment / Plan / Recommendation Comments on Treatment Session  Pt progressing well with mobility however remains at increased fall risk as demo'd by 15 on DGI. Patient is safe to return home with 24/7 supervision which she reports can be provided by son and husband who is healthy. Pt to con't to benefit from HHPT to improve balance deficits.    Follow Up Recommendations  Home health PT;Supervision for mobility/OOB     Does the patient have the potential to tolerate intense rehabilitation     Barriers to Discharge        Equipment Recommendations  None recommended by PT    Recommendations for Other Services    Frequency Min 3X/week   Plan Discharge plan remains appropriate;Frequency remains appropriate    Precautions / Restrictions Precautions Precautions: Fall Restrictions Weight Bearing Restrictions: No   Pertinent Vitals/Pain 0/10, pt denies     Mobility  Bed Mobility Bed Mobility: Not assessed Transfers Transfers: Sit to Stand;Stand to Sit Sit to Stand: 5: Supervision;With upper extremity assist;From chair/3-in-1 Stand to Sit: 5: Supervision;With upper extremity assist;With armrests;To chair/3-in-1 Details for Transfer Assistance: pt demo'd safe technique Ambulation/Gait Ambulation/Gait Assistance: 4: Min guard Ambulation Distance (Feet): 200 Feet Assistive device: None (supervision with RW) Ambulation/Gait Assistance Details: pt with increased stabiltiy with RW however reports "It doesn't really help me." Gait Pattern: Step-through pattern;Decreased stride length;Narrow base of support Gait velocity: wfl for age Stairs: Yes Stairs Assistance: 4: Min guard Stair Management Technique: Step to pattern;One rail Right Number of Stairs: 3  Modified Rankin (Stroke Patients Only) Pre-Morbid  Rankin Score: No symptoms Modified Rankin: Moderate disability    Exercises     PT Diagnosis:    PT Problem List:   PT Treatment Interventions:     PT Goals Acute Rehab PT Goals PT Goal: Sit to Stand - Progress: Progressing toward goal PT Goal: Stand to Sit - Progress: Progressing toward goal PT Transfer Goal: Bed to Chair/Chair to Bed - Progress: Progressing toward goal PT Goal: Ambulate - Progress: Progressing toward goal PT Goal: Up/Down Stairs - Progress: Progressing toward goal  Visit Information  Last PT Received On: 03/07/12 Assistance Needed: +1    Subjective Data  Subjective: pt received sitting up in chair agreeable to PT. Patient Stated Goal: home   Cognition  Overall Cognitive Status: Appears within functional limits for tasks assessed/performed Area of Impairment: Safety/judgement Arousal/Alertness: Awake/alert Orientation Level: Appears intact for tasks assessed Behavior During Session: [facility] for tasks performed Safety/Judgement: Decreased awareness of need for assistance (pt feels she is safe to amb without RW ) Safety/Judgement - Other Comments: pt with no LOB when amb without RW however does have increased steadiness with RW    Balance  Dynamic Gait Index Level Surface: Mild Impairment Change in Gait Speed: Mild Impairment Gait with Horizontal Head Turns: Mild Impairment Gait with Vertical Head Turns: Mild Impairment Gait and Pivot Turn: Mild Impairment Step Over Obstacle: Mild Impairment Step Around Obstacles: Mild Impairment Steps: Moderate Impairment Total Score: 15   End of Session PT - End of Session Equipment Utilized During Treatment: Gait belt Activity Tolerance: Patient tolerated treatment well Patient left: in chair;with call bell/phone within reach Nurse Communication: Mobility status   GP     Marcene Brawn 03/07/2012, 1:54 PM  Lewis Shock, PT, DPT Pager #: [phone] Office #: [phone]

## 2012-03-08 LAB — BASIC METABOLIC PANEL
BUN: 21 mg/dL (ref 6–23)
CO2: 24 mEq/L (ref 19–32)
Calcium: 8.2 mg/dL — ABNORMAL LOW (ref 8.4–10.5)
Creatinine, Ser: 0.92 mg/dL (ref 0.50–1.10)
Glucose, Bld: 85 mg/dL (ref 70–99)

## 2012-03-08 LAB — GLUCOSE, CAPILLARY
Glucose-Capillary: 134 mg/dL — ABNORMAL HIGH (ref 70–99)
Glucose-Capillary: 205 mg/dL — ABNORMAL HIGH (ref 70–99)

## 2012-03-08 MED ORDER — ACETAMINOPHEN-CODEINE #3 300-30 MG PO TABS
1.0000 | ORAL_TABLET | Freq: Four times a day (QID) | ORAL | Status: DC | PRN
Start: 2012-03-08 — End: 2012-05-26

## 2012-03-08 MED ORDER — ACETAMINOPHEN-CODEINE #3 300-30 MG PO TABS
1.0000 | ORAL_TABLET | ORAL | Status: DC | PRN
  Administered 2012-03-08: 1 via ORAL
  Filled 2012-03-08: qty 1

## 2012-03-08 MED ORDER — INSULIN DETEMIR 100 UNIT/ML ~~LOC~~ SOLN: 5.0000 [IU] | Freq: Every day | SUBCUTANEOUS | Status: DC

## 2012-03-08 NOTE — Progress Notes (Addendum)
Pt d/c home today. Instructions reviewed and copy given to pt. Script given to pt. No new diagnosis of CVA this admit per notes only old CVA, pt states she is aware of signs and symptoms and knows when to call 911. See admitting MD notes. Pt d/c'd via wheelchair with daughter and belongings escorted by unit NT. Pt refused to wait until case manager could arrive to unit to arrange HHPT eval to be done. CM was informed of pt leaving, didn't want to wait any longer. Also CM informed of pt's daughter inquiring if pt could get a hospital bed and an aide. Pt able to walk with 1 assist at side and able to get in and out of bed. Daughter's concern was that pt fell out of bed and feels a hospital bed with rails would keep her from falling out.  Info passed along to CM, Tye. She will attempt to reach pt/family via phone.

## 2012-03-08 NOTE — Discharge Summary (Signed)
Physician Discharge Summary  Carmen [name]:[id number] DOB: June 20, 1933 DOA: 03/04/2012  PCP: Gwynneth Aliment, MD  Admit date: 03/04/2012 Discharge date: 03/08/2012  Time spent: 20 minutes  Recommendations for Outpatient Follow-up:  1. Patient will have a home health PT eval 2. She'll followup with her primary care physician in the next month  Discharge Diagnoses:  Principal Problem:  *Hypokalemia Active Problems:  HTN (hypertension), malignant  DM (diabetes mellitus), type 2, uncontrolled, periph vascular complic  History of CVA (cerebrovascular accident) with associated mild right upper extremity hemiplegia  Cardiac pacemaker for history of intermittent heart block  CVA (cerebral infarction)  Acute encephalopathy  Thrombocytopenia  Severe malnutrition   Discharge Condition: Improved, being discharged home  Diet recommendation: Heart healthy, carb modified  Filed Weights   03/04/12 1251  Weight: 50.1 kg (110 lb 7.2 oz)    History of present illness:  56 her Afro-American female past history diabetes, hypertension and CVA presented via EMS with confusion and history of fall. She is also found to have profound hypokalemia reading less than 2.0 and hypoglycemia in the 50s. Patient was given the number of doses of IV potassium in the emergency room. There is a concern because of acute encephalopathy that she had had a stroke. Initial CAT scan was negative. Patient was admitted to the hospital service for further evaluation.  Hospital Course:  1. Acute encephalopathy: Resolved. Presumed etiology is profound hypokalemia and hypoglycemia. CT of head on admission was negative. No MRI with history of pacemaker. Repeat CT head confirmed no evidence of CVA.  Given the speed of the patient's recovery dehydration, hypoglycemia and hypokalemia are suspected etiologies. CT head is negative would pursue no further evaluation. CT scan unremarkable at time of admission. Carotid Dopplers  unremarkable. 2-D echocardiogram from January 2013 with normal ejection fraction, mild pulmonary hypertension. No comment on diastolic function. 2. Profound hypokalemia: Resolved at this point after 72 hours of aggressive supplementation. Most likely secondary to increased Lasix dose, may also have been taking chlorthalidone. No lower extremity edema now. Echocardiogram from earlier this year noted above. Repeat potassium was normal on day of discharge. Lasix is being discontinued. 3. Thrombocytopenia: Stable. Seen intermittently since may of this year. Etiology unclear. Followup as an outpatient.  4. Diabetes mellitus with severe hypoglycemia on admission: Hemoglobin A1c 11.1. Likely due to Levemir therapy in the setting of poor oral intake. Now stable on sliding scale insulin. 5. Sugars ranged in the 150s to 200s on sliding scale alone. No other appetite has improved. Appreciate diabetic coordinator followup. Have restarted Levemir at 5 mg each bedtime. Will have patient follow with her primary care physician to consider adding Amaryl or changing her 70/30 insulin. 6. History of CVA: Chronic weakness right upper extremity.  7. Hypertension with history of renal artery stenosis: Poor control. Continue clonidine, hydralazine, metoprolol. Increase losartan. Status post left renal artery stent 2009 with followup angioplasty 2011. 8. History of intermittent heart block status post pacemaker  9. History of cataracts: Patient was scheduled for cataract surgery with Dr. Darel Hong [phone]). This was canceled because of her hospitalization. Needs to follow up with PCP first for medical clearance then can reschedule. 10. Severe malnutrition in context of chronic illness: Patient encouraged to eat. Appetite improved as her electrolytes were normalized.  Procedures:  Carotid Dopplers done 12/29:Right: moderate to severe calcific plaque origin and proximal ICA. <40% ICA stenosis. Left: mild calcific plaque origin  and proximal ICA. No significant ICA stenosis. Bilateral vertebral artery flow is antegrade.  Consultations:  None  Discharge Exam: Filed Vitals:   03/08/12 1030 03/08/12 1100 03/08/12 1235 03/08/12 1331  BP: 197/72 194/67 188/56 176/44  Pulse: 60 60 59 64  Temp: 98.7 F (37.1 C)   97.9 F (36.6 C)  TempSrc: Oral     Resp: 18   18  Height:      Weight:      SpO2: 100%   100%    General: Alert and oriented x3, no acute distress, fatigued, feeling better today no nausea vomiting Cardiovascular: Regular rhythm, S1-S2, 2/6 systolic ejection murmur-soft Respiratory: Regular rate and rhythm, S1-S2 Abdomen: Soft, nontender, nondistended, positive bowel sounds Extremities: No clubbing or cyanosis, trace pitting edema  Discharge Instructions  Discharge Orders    Future Orders Please Complete By Expires   Diet - low sodium heart healthy      Increase activity slowly          Medication List     As of 03/08/2012  1:55 PM    STOP taking these medications         furosemide 20 MG tablet   Commonly known as: LASIX      TAKE these medications         acetaminophen-codeine 300-30 MG per tablet   Commonly known as: TYLENOL #3   Take 1 tablet by mouth every 6 (six) hours as needed. For pain      ALPRAZolam 0.5 MG tablet   Commonly known as: XANAX   Take 0.5 mg by mouth daily as needed. For anxiety      aspirin 81 MG chewable tablet   Chew 81 mg by mouth daily.      chlorthalidone 25 MG tablet   Commonly known as: HYGROTON   Take 25 mg by mouth daily.      cilostazol 100 MG tablet   Commonly known as: PLETAL   Take 100 mg by mouth 2 (two) times daily.      cloNIDine 0.3 mg/24hr   Commonly known as: CATAPRES - Dosed in mg/24 hr   Place 1 patch onto the skin once a week.      hydrALAZINE 50 MG tablet   Commonly known as: APRESOLINE   Take 50 mg by mouth 4 (four) times daily.      insulin detemir 100 UNIT/ML injection   Commonly known as: LEVEMIR   Inject 5  Units into the skin daily.      losartan 100 MG tablet   Commonly known as: COZAAR   Take 50 mg by mouth daily.      metoCLOPramide 5 MG tablet   Commonly known as: REGLAN   Take 5 mg by mouth 4 (four) times daily.      metoprolol 100 MG tablet   Commonly known as: LOPRESSOR   Take 100 mg by mouth 2 (two) times daily.      omega-3 acid ethyl esters 1 G capsule   Commonly known as: LOVAZA   Take 1 g by mouth 2 (two) times daily.           Follow-up Information    Follow up with Gwynneth Aliment, MD. Schedule an appointment as soon as possible for a visit in 1 month.   Contact information:   [address] [place] [address] [phone]           The results of significant diagnostics from this hospitalization (including imaging, microbiology, ancillary and laboratory) are listed below for reference.  Significant Diagnostic Studies: Ct Head Wo Contrast  03/07/2012  .  IMPRESSION: Stable exam.  Atrophy with chronic microvascular ischemic change and remote infarcts as described.  No acute intracranial findings.   Original Report Authenticated By: Davonna Belling, M.D.    Ct Head Wo Contrast  03/04/2012  IMPRESSION:  1.  Left sided scalp soft tissue swelling, mild. 2. No acute intracranial abnormality. 3.  Small vessel ischemic change with remote right basil ganglia lacunar infarct.   Original Report Authenticated By: Jeronimo Greaves, M.D.     Microbiology: Recent Results (from the past 240 hour(s))  MRSA PCR SCREENING     Status: Normal   Collection Time   03/04/12  1:30 PM      Component Value Range Status Comment   MRSA by PCR NEGATIVE  NEGATIVE Final      Labs: Basic Metabolic Panel:  Lab 03/08/12 7829 03/07/12 0605 03/06/12 0420 03/05/12 1110 03/05/12 0153 03/04/12 1217  NA 137 135 136 136 132* --  K 3.8 4.3 3.1* 2.3* 2.3* --  CL 105 103 100 97 92* --  CO2 24 26 30 31  33* --  GLUCOSE 85 182* 110* 286* 295* --  BUN 21 20 20 19 18  --  CREATININE  0.92 0.87 0.94 0.77 0.83 --  CALCIUM 8.2* 8.2* 8.3* 7.7* 7.6* --  MG -- -- 1.9 -- 1.6 1.7  PHOS -- -- -- -- -- --   CBC:  Lab 03/07/12 0605 03/06/12 0420 03/05/12 0153 03/04/12 0904  WBC 3.5* 3.7* 5.7 5.1  NEUTROABS -- -- -- 4.4  HGB 12.9 11.6* 12.2 16.7*  HCT 36.9 33.8* 35.9* 46.7*  MCV 97.9 97.1 97.0 96.3  PLT 96* 94* 86* 108*   Cardiac Enzymes:  Lab 03/06/12 0420 03/04/12 0905  CKTOTAL 130 147  CKMB -- 5.0*  CKMBINDEX -- --  TROPONINI -- <0.30   CBG:  Lab 03/08/12 1126 03/08/12 0645 03/07/12 2109 03/07/12 1649 03/07/12 0644  GLUCAP 205* 134* 285* 339* 173*       Signed:  Ela Moffat K  [facility] 03/08/2012, 1:55 PM

## 2012-04-14 ENCOUNTER — Other Ambulatory Visit: Payer: Self-pay | Admitting: Cardiology

## 2012-04-14 DIAGNOSIS — I10 Essential (primary) hypertension: Secondary | ICD-10-CM

## 2012-04-14 DIAGNOSIS — D3500 Benign neoplasm of unspecified adrenal gland: Secondary | ICD-10-CM

## 2012-04-18 ENCOUNTER — Ambulatory Visit
Admission: RE | Admit: 2012-04-18 | Discharge: 2012-04-18 | Disposition: A | Discharge status: Home / Self Care | Payer: Medicare Other | Source: Ambulatory Visit | Attending: Cardiology | Admitting: Cardiology

## 2012-04-18 DIAGNOSIS — D3500 Benign neoplasm of unspecified adrenal gland: Secondary | ICD-10-CM

## 2012-04-18 DIAGNOSIS — I10 Essential (primary) hypertension: Secondary | ICD-10-CM

## 2012-04-18 MED ORDER — IOHEXOL 350 MG/ML SOLN
80.0000 mL | Freq: Once | INTRAVENOUS | Status: AC | PRN
  Administered 2012-04-18: 80 mL via INTRAVENOUS

## 2012-04-30 ENCOUNTER — Emergency Department (HOSPITAL_COMMUNITY): Payer: Medicare Other

## 2012-04-30 ENCOUNTER — Inpatient Hospital Stay (HOSPITAL_COMMUNITY)
Admission: EM | Admit: 2012-04-30 | Discharge: 2012-05-02 | DRG: 638 | Disposition: A | Discharge status: Home / Self Care | Payer: Medicare Other | Attending: Internal Medicine | Admitting: Internal Medicine

## 2012-04-30 ENCOUNTER — Other Ambulatory Visit: Payer: Self-pay

## 2012-04-30 ENCOUNTER — Encounter (HOSPITAL_COMMUNITY): Payer: Self-pay

## 2012-04-30 DIAGNOSIS — E1151 Type 2 diabetes mellitus with diabetic peripheral angiopathy without gangrene: Secondary | ICD-10-CM

## 2012-04-30 DIAGNOSIS — E1101 Type 2 diabetes mellitus with hyperosmolarity with coma: Secondary | ICD-10-CM

## 2012-04-30 DIAGNOSIS — Z8673 Personal history of transient ischemic attack (TIA), and cerebral infarction without residual deficits: Secondary | ICD-10-CM

## 2012-04-30 DIAGNOSIS — E1159 Type 2 diabetes mellitus with other circulatory complications: Secondary | ICD-10-CM

## 2012-04-30 DIAGNOSIS — W19XXXA Unspecified fall, initial encounter: Secondary | ICD-10-CM | POA: Diagnosis present

## 2012-04-30 DIAGNOSIS — I10 Essential (primary) hypertension: Secondary | ICD-10-CM

## 2012-04-30 DIAGNOSIS — I150 Renovascular hypertension: Secondary | ICD-10-CM | POA: Diagnosis present

## 2012-04-30 DIAGNOSIS — S0190XA Unspecified open wound of unspecified part of head, initial encounter: Secondary | ICD-10-CM | POA: Diagnosis present

## 2012-04-30 DIAGNOSIS — D696 Thrombocytopenia, unspecified: Secondary | ICD-10-CM | POA: Diagnosis present

## 2012-04-30 DIAGNOSIS — N179 Acute kidney failure, unspecified: Secondary | ICD-10-CM

## 2012-04-30 DIAGNOSIS — W19XXXD Unspecified fall, subsequent encounter: Secondary | ICD-10-CM

## 2012-04-30 DIAGNOSIS — R739 Hyperglycemia, unspecified: Secondary | ICD-10-CM

## 2012-04-30 DIAGNOSIS — E119 Type 2 diabetes mellitus without complications: Secondary | ICD-10-CM | POA: Diagnosis present

## 2012-04-30 DIAGNOSIS — I2510 Atherosclerotic heart disease of native coronary artery without angina pectoris: Secondary | ICD-10-CM | POA: Diagnosis present

## 2012-04-30 DIAGNOSIS — E1100 Type 2 diabetes mellitus with hyperosmolarity without nonketotic hyperglycemic-hyperosmolar coma (NKHHC): Principal | ICD-10-CM | POA: Diagnosis present

## 2012-04-30 DIAGNOSIS — E876 Hypokalemia: Secondary | ICD-10-CM | POA: Diagnosis present

## 2012-04-30 DIAGNOSIS — S0101XA Laceration without foreign body of scalp, initial encounter: Secondary | ICD-10-CM

## 2012-04-30 DIAGNOSIS — E860 Dehydration: Secondary | ICD-10-CM | POA: Diagnosis present

## 2012-04-30 DIAGNOSIS — I798 Other disorders of arteries, arterioles and capillaries in diseases classified elsewhere: Secondary | ICD-10-CM

## 2012-04-30 DIAGNOSIS — Z66 Do not resuscitate: Secondary | ICD-10-CM | POA: Diagnosis present

## 2012-04-30 LAB — BASIC METABOLIC PANEL
BUN: 10 mg/dL (ref 6–23)
CO2: 30 mEq/L (ref 19–32)
Calcium: 8.7 mg/dL (ref 8.4–10.5)
Chloride: 94 mEq/L — ABNORMAL LOW (ref 96–112)
Creatinine, Ser: 0.73 mg/dL (ref 0.50–1.10)
GFR calc non Af Amer: 79 mL/min — ABNORMAL LOW (ref >90)
Glucose, Bld: 838 mg/dL (ref 70–99)
Glucose, Bld: 412 mg/dL — ABNORMAL HIGH (ref 70–99)
Potassium: 2.3 mEq/L — CL (ref 3.5–5.1)
Potassium: 2.2 mEq/L — CL (ref 3.5–5.1)
Sodium: 125 mEq/L — ABNORMAL LOW (ref 135–145)

## 2012-04-30 LAB — GLUCOSE, CAPILLARY
Glucose-Capillary: >600 mg/dL (ref 70–99)
Glucose-Capillary: 457 mg/dL — ABNORMAL HIGH (ref 70–99)
Glucose-Capillary: 365 mg/dL — ABNORMAL HIGH (ref 70–99)
Glucose-Capillary: 278 mg/dL — ABNORMAL HIGH (ref 70–99)
Glucose-Capillary: 223 mg/dL — ABNORMAL HIGH (ref 70–99)

## 2012-04-30 LAB — URINE MICROSCOPIC-ADD ON

## 2012-04-30 LAB — URINALYSIS, ROUTINE W REFLEX MICROSCOPIC
Ketones, ur: NEGATIVE mg/dL
Leukocytes, UA: NEGATIVE
Nitrite: NEGATIVE
Protein, ur: 30 mg/dL — AB

## 2012-04-30 LAB — CBC WITH DIFFERENTIAL/PLATELET
Basophils Absolute: 0 10*3/uL (ref 0.0–0.1)
Eosinophils Absolute: 0 10*3/uL (ref 0.0–0.7)
Eosinophils Relative: 1 % (ref 0–5)
Lymphocytes Relative: 33 % (ref 12–46)
Lymphs Abs: 1 10*3/uL (ref 0.7–4.0)
MCV: 94 fL (ref 78.0–100.0)
Neutrophils Relative %: 61 % (ref 43–77)
Platelets: 95 10*3/uL — ABNORMAL LOW (ref 150–400)
RBC: 4.34 MIL/uL (ref 3.87–5.11)
RDW: 13 % (ref 11.5–15.5)
WBC: 3.2 10*3/uL — ABNORMAL LOW (ref 4.0–10.5)

## 2012-04-30 MED ORDER — HEPARIN SODIUM (PORCINE) 5000 UNIT/ML IJ SOLN
5000.0000 [IU] | Freq: Three times a day (TID) | INTRAMUSCULAR | Status: DC
  Administered 2012-04-30 – 2012-05-02 (×5): 5000 [IU] via SUBCUTANEOUS
  Filled 2012-04-30 (×8): qty 1

## 2012-04-30 MED ORDER — ALPRAZOLAM 0.5 MG PO TABS: 0.5000 mg | ORAL_TABLET | Freq: Every day | ORAL | Status: DC | PRN

## 2012-04-30 MED ORDER — CHLORTHALIDONE 25 MG PO TABS
25.0000 mg | ORAL_TABLET | Freq: Every day | ORAL | Status: DC
  Administered 2012-05-01 – 2012-05-02 (×2): 25 mg via ORAL
  Filled 2012-04-30 (×2): qty 1

## 2012-04-30 MED ORDER — POTASSIUM CHLORIDE CRYS ER 20 MEQ PO TBCR
40.0000 meq | EXTENDED_RELEASE_TABLET | Freq: Three times a day (TID) | ORAL | Status: DC
  Administered 2012-04-30 – 2012-05-01 (×2): 40 meq via ORAL
  Filled 2012-04-30 (×5): qty 2

## 2012-04-30 MED ORDER — ACETAMINOPHEN-CODEINE #3 300-30 MG PO TABS
1.0000 | ORAL_TABLET | Freq: Four times a day (QID) | ORAL | Status: DC | PRN
  Administered 2012-04-30: 1 via ORAL
  Filled 2012-04-30: qty 1

## 2012-04-30 MED ORDER — SODIUM CHLORIDE 0.9 % IV BOLUS (SEPSIS)
500.0000 mL | Freq: Once | INTRAVENOUS | Status: AC
  Administered 2012-04-30: 500 mL via INTRAVENOUS

## 2012-04-30 MED ORDER — DEXTROSE 50 % IV SOLN: 25.0000 mL | INTRAVENOUS | Status: DC | PRN

## 2012-04-30 MED ORDER — SODIUM CHLORIDE 0.9 % IV SOLN: INTRAVENOUS | Status: DC

## 2012-04-30 MED ORDER — METOPROLOL TARTRATE 100 MG PO TABS
100.0000 mg | ORAL_TABLET | Freq: Two times a day (BID) | ORAL | Status: DC
  Administered 2012-04-30 – 2012-05-02 (×4): 100 mg via ORAL
  Filled 2012-04-30 (×5): qty 1

## 2012-04-30 MED ORDER — ACETAMINOPHEN 325 MG PO TABS: 650.0000 mg | ORAL_TABLET | Freq: Four times a day (QID) | ORAL | Status: DC | PRN

## 2012-04-30 MED ORDER — HYDRALAZINE HCL 50 MG PO TABS
50.0000 mg | ORAL_TABLET | Freq: Four times a day (QID) | ORAL | Status: DC
Start: 2012-04-30 — End: 2012-05-02
  Administered 2012-04-30 – 2012-05-02 (×6): 50 mg via ORAL
  Filled 2012-04-30 (×10): qty 1

## 2012-04-30 MED ORDER — POTASSIUM CHLORIDE 10 MEQ/100ML IV SOLN
INTRAVENOUS | Status: AC
  Administered 2012-04-30: 10 meq
  Filled 2012-04-30: qty 100

## 2012-04-30 MED ORDER — ASPIRIN 81 MG PO CHEW
81.0000 mg | CHEWABLE_TABLET | Freq: Every day | ORAL | Status: DC
  Administered 2012-05-01 – 2012-05-02 (×2): 81 mg via ORAL
  Filled 2012-04-30 (×2): qty 1

## 2012-04-30 MED ORDER — LOSARTAN POTASSIUM 50 MG PO TABS
50.0000 mg | ORAL_TABLET | Freq: Every day | ORAL | Status: DC
  Administered 2012-04-30 – 2012-05-02 (×3): 50 mg via ORAL
  Filled 2012-04-30 (×4): qty 1

## 2012-04-30 MED ORDER — OMEGA-3-ACID ETHYL ESTERS 1 G PO CAPS
1.0000 g | ORAL_CAPSULE | Freq: Two times a day (BID) | ORAL | Status: DC
  Administered 2012-04-30 – 2012-05-02 (×4): 1 g via ORAL
  Filled 2012-04-30 (×6): qty 1

## 2012-04-30 MED ORDER — ALUM & MAG HYDROXIDE-SIMETH 200-200-20 MG/5ML PO SUSP: 30.0000 mL | Freq: Four times a day (QID) | ORAL | Status: DC | PRN

## 2012-04-30 MED ORDER — ONDANSETRON HCL 4 MG/2ML IJ SOLN: 4.0000 mg | Freq: Four times a day (QID) | INTRAMUSCULAR | Status: DC | PRN

## 2012-04-30 MED ORDER — HYDRALAZINE HCL 20 MG/ML IJ SOLN
5.0000 mg | Freq: Four times a day (QID) | INTRAMUSCULAR | Status: DC | PRN
  Filled 2012-04-30: qty 0.25

## 2012-04-30 MED ORDER — POTASSIUM CHLORIDE CRYS ER 20 MEQ PO TBCR: 40.0000 meq | EXTENDED_RELEASE_TABLET | Freq: Once | ORAL | Status: DC

## 2012-04-30 MED ORDER — ONDANSETRON HCL 4 MG PO TABS: 4.0000 mg | ORAL_TABLET | Freq: Four times a day (QID) | ORAL | Status: DC | PRN

## 2012-04-30 MED ORDER — POTASSIUM CHLORIDE CRYS ER 20 MEQ PO TBCR
40.0000 meq | EXTENDED_RELEASE_TABLET | Freq: Once | ORAL | Status: AC
  Administered 2012-04-30: 40 meq via ORAL
  Filled 2012-04-30: qty 2

## 2012-04-30 MED ORDER — POTASSIUM CHLORIDE 10 MEQ/100ML IV SOLN
10.0000 meq | INTRAVENOUS | Status: AC
  Administered 2012-04-30 (×2): 10 meq via INTRAVENOUS
  Filled 2012-04-30 (×2): qty 100

## 2012-04-30 MED ORDER — METOCLOPRAMIDE HCL 5 MG PO TABS
5.0000 mg | ORAL_TABLET | Freq: Four times a day (QID) | ORAL | Status: DC
  Administered 2012-04-30 – 2012-05-02 (×6): 5 mg via ORAL
  Filled 2012-04-30 (×10): qty 1

## 2012-04-30 MED ORDER — SODIUM CHLORIDE 0.9 % IJ SOLN
3.0000 mL | Freq: Two times a day (BID) | INTRAMUSCULAR | Status: DC
  Administered 2012-05-01 – 2012-05-02 (×2): 3 mL via INTRAVENOUS

## 2012-04-30 MED ORDER — TETANUS-DIPHTH-ACELL PERTUSSIS 5-2.5-18.5 LF-MCG/0.5 IM SUSP
0.5000 mL | Freq: Once | INTRAMUSCULAR | Status: AC
  Administered 2012-04-30: 0.5 mL via INTRAMUSCULAR
  Filled 2012-04-30: qty 0.5

## 2012-04-30 MED ORDER — POTASSIUM CHLORIDE 10 MEQ/100ML IV SOLN
10.0000 meq | INTRAVENOUS | Status: DC
  Administered 2012-04-30: 10 meq via INTRAVENOUS
  Filled 2012-04-30: qty 100

## 2012-04-30 MED ORDER — ACETAMINOPHEN 650 MG RE SUPP: 650.0000 mg | Freq: Four times a day (QID) | RECTAL | Status: DC | PRN

## 2012-04-30 MED ORDER — SODIUM CHLORIDE 0.9 % IV SOLN
INTRAVENOUS | Status: DC
  Administered 2012-04-30: 5.4 [IU]/h via INTRAVENOUS
  Filled 2012-04-30: qty 1

## 2012-04-30 NOTE — ED Notes (Signed)
Admit Doctor at bedside.  

## 2012-04-30 NOTE — H&P (Addendum)
History and Physical  Carmen [name] WGN:[id number] DOB: 05/29/1933 DOA: 04/30/2012  Referring physician: Linwood Dibbles, MD PCP: Gwynneth Aliment, MD   Chief Complaint: fall  HPI:  79 year old woman PMH DM presented to the emergency department with history of mechanical fall. Routine laboratory evaluation revealed hyperosmolar hyperglycemic state, profound hypokalemia and elevated blood pressure.  History obtained from patient and daughter at bedside. The patient has felt well lately with no specific complaints. She was wearing flip-flops today and tripped on a kitchen and fell. She denies loss of consciousness or syncope. She sustained a small laceration to her head. It is for this reason that she came to the emergency room. She is very vague in regard to her diabetes, insulin administration and glycemic control. She reports compliance but does not give specific numbers. Per daughter blood sugars have been 200 or more, the frequency of checks is not clear. Her daughter reports that her speech is been slightly slurred for a week or more. No focal neurologic deficit, no difficulty swallowing or other specific complaints.   Blood pressure is been uncontrolled for a long time per family. Lower extremity edema has been chronic.  In ED SBP 220s, vitals otherwise unremarkable. Glucose 838, K 2.3, platlets 95. CT head and cervical spine without acute abnormalities. Treated with IV fluids, oral and IV potassium.   Chart Review:  02/2012--profound hypoglycemia, acute encephalopathy  11/2011--Diabetic hyperosmolar non-ketotic state  Review of Systems:  Negative for fever, visual changes, sore throat, rash, new muscle aches, chest pain, SOB, dysuria, bleeding, n/v/abdominal pain.  Positive for chronic bilateral lower extremity edema  Past Medical History  Diagnosis Date  . Diabetes mellitus   . Hypertension   . Coronary artery disease   . Renal disorder   . Herniated lumbar intervertebral disc   .  Cataract     cataract surgery scheduled for 03/31/11, 2 - left eye, 1 - right eye  . Renovascular hypertension      s/p left renal artery stent 12/2007.   S/P balloon angioplasty on 02/16/10 for ISR, BP was  controlled well since then.     . Claudication in peripheral vascular disease     02/16/10: Left CIA 9.0x28 Omnilink and REIA 8.0x40 seff expanding Zilver.   right subclavian artery stent 03/18/2008,  . S/P carotid endarterectomy      left carotid endarterectomy  1991; Stroke and TIA in 1999 with right sided weakness, now with residual right arm weakness  . Stroke     TIA  . Peripheral vascular disease   . Anxiety     Past Surgical History  Procedure Laterality Date  . Appendectomy    . Abdominal hysterectomy    . Ureteral stent placement    . Carotid endarterectomy    . Laminectomy    . Back surgery    . Colonoscopy  07/30/2011    Procedure: COLONOSCOPY;  Surgeon: Louis Meckel, MD;  Location: [facility] ENDOSCOPY;  Service: Endoscopy;  Laterality: N/A;  gi bleed  . Esophagogastroduodenoscopy  07/30/2011    Procedure: ESOPHAGOGASTRODUODENOSCOPY (EGD);  Surgeon: Louis Meckel, MD;  Location: [facility] ENDOSCOPY;  Service: Endoscopy;  Laterality: N/A;  . Pacemaker insertion      Social History:  reports that she has been smoking Cigarettes.  She has a 32.5 pack-year smoking history. She has never used smokeless tobacco. She reports that she does not drink alcohol or use illicit drugs.  Allergies  Allergen Reactions  . Norvasc (Amlodipine Besylate) Swelling  . Lasix (  Furosemide)     Caused a problem with her kidneys  . Peanut-Containing Drug Products Other (See Comments)    Cause my stomach to hurt    Family History  Problem Relation Age of Onset  . Cancer Father     Prior to Admission medications   Medication Sig Start Date End Date Taking? Authorizing Provider  acetaminophen-codeine (TYLENOL #3) 300-30 MG per tablet Take 1 tablet by mouth every 6 (six) hours as needed. For pain  03/08/12  Yes Hollice Espy, MD  ALPRAZolam Prudy Feeler) 0.5 MG tablet Take 0.5 mg by mouth daily as needed. For anxiety   Yes Historical Provider, MD  aspirin 81 MG chewable tablet Chew 81 mg by mouth daily. 10/18/11 10/17/12 Yes Altha Harm, MD  chlorthalidone (HYGROTON) 25 MG tablet Take 25 mg by mouth daily.   Yes Historical Provider, MD  hydrALAZINE (APRESOLINE) 50 MG tablet Take 50 mg by mouth 4 (four) times daily.    Yes Historical Provider, MD  HYDROcodone-acetaminophen (VICODIN) 5-500 MG per tablet Take 1 tablet by mouth every 6 (six) hours as needed for pain.   Yes Historical Provider, MD  insulin detemir (LEVEMIR) 100 UNIT/ML injection Inject 5 Units into the skin 3 (three) times daily.   Yes Historical Provider, MD  losartan (COZAAR) 100 MG tablet Take 50 mg by mouth daily.   Yes Historical Provider, MD  metoCLOPramide (REGLAN) 5 MG tablet Take 5 mg by mouth 4 (four) times daily.   Yes Historical Provider, MD  metoprolol (LOPRESSOR) 100 MG tablet Take 100 mg by mouth 2 (two) times daily.   Yes Historical Provider, MD  omega-3 acid ethyl esters (LOVAZA) 1 G capsule Take 1 g by mouth 2 (two) times daily.   Yes Historical Provider, MD   Physical Exam: Filed Vitals:   04/30/12 1441 04/30/12 1442 04/30/12 1500 04/30/12 1600  BP: 196/59  224/61 207/61  Pulse: 60  60 60  Temp: 98 F (36.7 C)     TempSrc: Oral     Resp: 19  16 21   SpO2: 97% 96% 97% 97%   General:  Examined in the emergency department. Appears calm and comfortable. Participate in the history and examination. Eyes: PERRL, normal lids, irises. Bilateral cataracts noted. ENT: grossly normal hearing, lips & tongue, mouth appears dry Neck: no LAD, masses or thyromegaly Cardiovascular: RRR, no m/r/g. 1+ bilateral LE edema. Respiratory: CTA bilaterally, no w/r/r. Normal respiratory effort. Abdomen: soft, ntnd Skin: no rash seen, bilateral lower extremities with very dry and flaking skin Musculoskeletal: grossly normal  tone and strength BUE/BLE Psychiatric: grossly normal mood and affect, speech fluent and appropriate, No dysarthria noted.  Neurologic: grossly non-focal.  Wt Readings from Last 3 Encounters:  03/04/12 50.1 kg (110 lb 7.2 oz)  11/13/11 53.071 kg (117 lb)  10/17/11 51.9 kg (114 lb 6.7 oz)    Labs on Admission:  Basic Metabolic Panel:  Recent Labs Lab 04/30/12 1519  NA 125*  K 2.3*  CL 86*  CO2 30  GLUCOSE 838*  BUN 12  CREATININE 0.79  CALCIUM 8.7   CBC:  Recent Labs Lab 04/30/12 1519  WBC 3.2*  NEUTROABS 1.9  HGB 14.3  HCT 40.8  MCV 94.0  PLT 95*   CBG:  Recent Labs Lab 04/30/12 1510  GLUCAP >600*    Radiological Exams on Admission: Ct Head Wo Contrast  04/30/2012  *RADIOLOGY REPORT*  Clinical Data:   Trauma.  Laceration.  CT HEAD WITHOUT CONTRAST  Technique: Contiguous axial  images were obtained from the base of the skull through the vertex without contrast  Comparison: Head CT of 03/07/2012 and.  No prior cervical spine imaging.  The CT angiography of the neck of 10/17/2011 is reviewed.  Findings:  Bone windows demonstrate probable staples about the left posterior scalp. No skull fracture.  Clear paranasal sinuses and mastoid air cells.  Soft tissue windows demonstrate advanced cerebral atrophy.  Similar mild low density in the periventricular white matter likely related to small vessel disease.  Remote right brain stem lacunar infarct on image 11.  Remote left basal ganglia lacunar infarcts.  No  mass lesion, hemorrhage, hydrocephalus, acute infarct, intra-axial, or extra-axial fluid collection.  IMPRESSION:  1.  No acute or post-traumatic deformity. 2.  Cerebral atrophy with chronic ischemic changes.  CT CERVICAL SPINE WITHOUT CONTRAST  Technique: Continous axial images were obtained of the cervical spine without contrast.  Sagittal and coronal reformats were constructed.  Findings:  Spinal visualization through bottom of T2. Prevertebral soft tissues are within  normal limits.  Dense atherosclerosis in the left common carotid artery.  Probable right-sided laryngeal diverticulum.  Sub centimeter left sided thyroid hypoattenuating nodule is nonspecific.  A vascular stent the right-sided thoracic inlet, suboptimally evaluated.  Centrilobular emphysema at the apices, but no pneumothorax.  Spondylosis, resulting in left-sided neural foraminal narrowing at C5-C6 and C6-C7.  Skull base intact.  Maintenance of vertebral body height.  Advanced degenerative disc disease at C5-C6.  Degenerative changes also are seen at the C1-C2 reticulation. Facets are well-aligned.  .  IMPRESSION: 1.  No acute fracture or subluxation. 2.  Spondylosis, most marked at C5-C6, resulting in neural foraminal narrowing.   Original Report Authenticated By: Jeronimo Greaves, M.D.    Ct Cervical Spine Wo Contrast  04/30/2012  *RADIOLOGY REPORT*  Clinical Data:   Trauma.  Laceration.  CT HEAD WITHOUT CONTRAST  Technique: Contiguous axial images were obtained from the base of the skull through the vertex without contrast  Comparison: Head CT of 03/07/2012 and.  No prior cervical spine imaging.  The CT angiography of the neck of 10/17/2011 is reviewed.  Findings:  Bone windows demonstrate probable staples about the left posterior scalp. No skull fracture.  Clear paranasal sinuses and mastoid air cells.  Soft tissue windows demonstrate advanced cerebral atrophy.  Similar mild low density in the periventricular white matter likely related to small vessel disease.  Remote right brain stem lacunar infarct on image 11.  Remote left basal ganglia lacunar infarcts.  No  mass lesion, hemorrhage, hydrocephalus, acute infarct, intra-axial, or extra-axial fluid collection.  IMPRESSION:  1.  No acute or post-traumatic deformity. 2.  Cerebral atrophy with chronic ischemic changes.  CT CERVICAL SPINE WITHOUT CONTRAST  Technique: Continous axial images were obtained of the cervical spine without contrast.  Sagittal and coronal  reformats were constructed.  Findings:  Spinal visualization through bottom of T2. Prevertebral soft tissues are within normal limits.  Dense atherosclerosis in the left common carotid artery.  Probable right-sided laryngeal diverticulum.  Sub centimeter left sided thyroid hypoattenuating nodule is nonspecific.  A vascular stent the right-sided thoracic inlet, suboptimally evaluated.  Centrilobular emphysema at the apices, but no pneumothorax.  Spondylosis, resulting in left-sided neural foraminal narrowing at C5-C6 and C6-C7.  Skull base intact.  Maintenance of vertebral body height.  Advanced degenerative disc disease at C5-C6.  Degenerative changes also are seen at the C1-C2 reticulation. Facets are well-aligned.  .  IMPRESSION: 1.  No acute fracture or subluxation. 2.  Spondylosis, most marked at C5-C6, resulting in neural foraminal narrowing.   Original Report Authenticated By: Jeronimo Greaves, M.D.     Active Problems:   HTN (hypertension), malignant   Hypokalemia   DM (diabetes mellitus), type 2, uncontrolled, periph vascular complic   Retrovascular hypertension status post left renal artery stent 2009 and balloon angioplasty for in-stent restenosis 2011   History of CVA (cerebrovascular accident) with associated mild right upper extremity hemiplegia   Diabetic hyperosmolar non-ketotic state   Thrombocytopenia   Fall   Assessment/Plan 1. Hyperosmolar hyperglycemic state with dehydration: No history, signs or symptoms to suggest infection, neurologic or cardiac event. Based on interview compliance is questioned. IV fluids, start insulin infusion when potassium repleted. 2. Hypokalemia: replete now. Serial BMP. Check EKG. 3. Malignant hypertension: Long-standing and currently asymptomatic. Continue outpatient medications, PRN hydralazine. 4. Mechanical fall with small laceration head: No loss of consciousness or syncope. Likely related to dehydration and prior problem. 5. DM uncontrolled,  hemoglobin A1c 11.1 02/2012: Plan as above, transition to long acting insulin on stable. 6. Renovascular HTN: bilateral by recent CTA, also with stenoses of SMA and IMA and bilateral iliac occlusive disease. Followup with Dr. Jacinto Halim as an outpatient. Follow-up with [facility] as outpatient for consideration of ABI. 7. Chronic thrombocytopenia: stable.  8. History of CAD: Asymptomatic. Continue aspirin, cardiac medications 9. S/p carotid endarterectomy: left carotid endarterectomy 1991; Stroke and TIA in 1999 with right sided weakness, now with residual right arm weakness   Code Status: DO NOT RESUSCITATE Family Communication: Discussed with daughter at bedside Disposition Plan/Anticipated LOS: Admit inpatient, telemetry, 2-4 days  Time spent: 35 minutes  Brendia Sacks, MD  [facility] Pager [phone] 04/30/2012, 5:13 PM

## 2012-04-30 NOTE — ED Provider Notes (Signed)
History    CSN: [id number] Arrival date & time 04/30/12  1427First MD Initiated Contact with Patient 04/30/12 1429      Chief Complaint  Patient presents with  . Fall    HPI The patient was in her house today when she tripped on her flip-flops and fell onto a tile floor. Patient sustained a laceration to her scalp. She denies any loss of consciousness. 911 was called for assistance. The patient was transported to the emergency room. Patient states she was able to get up and walk after the fall. She denies any neck pain or any other injuries. Patient denies any trouble with any recent fevers or illness. Family members are here with her today however any feel like her speech hasn't been exactly normal.  They deny that currently is slurred.  Patient has history of diabetes. She states she's been taking all her medications regularly. Past Medical History  Diagnosis Date  . Diabetes mellitus   . Hypertension   . Coronary artery disease   . Renal disorder   . Herniated lumbar intervertebral disc   . Cataract     cataract surgery scheduled for 03/31/11, 2 - left eye, 1 - right eye  . Renovascular hypertension      s/p left renal artery stent 12/2007.   S/P balloon angioplasty on 02/16/10 for ISR, BP was  controlled well since then.     . Claudication in peripheral vascular disease     02/16/10: Left CIA 9.0x28 Omnilink and REIA 8.0x40 seff expanding Zilver.   right subclavian artery stent 03/18/2008,  . S/P carotid endarterectomy      left carotid endarterectomy  1991; Stroke and TIA in 1999 with right sided weakness, now with residual right arm weakness  . Stroke     TIA  . Peripheral vascular disease   . Anxiety     Past Surgical History  Procedure Laterality Date  . Appendectomy    . Abdominal hysterectomy    . Ureteral stent placement    . Carotid endarterectomy    . Laminectomy    . Back surgery    . Colonoscopy  07/30/2011    Procedure: COLONOSCOPY;  Surgeon: Louis Meckel, MD;   Location: [facility] ENDOSCOPY;  Service: Endoscopy;  Laterality: N/A;  gi bleed  . Esophagogastroduodenoscopy  07/30/2011    Procedure: ESOPHAGOGASTRODUODENOSCOPY (EGD);  Surgeon: Louis Meckel, MD;  Location: [facility] ENDOSCOPY;  Service: Endoscopy;  Laterality: N/A;  . Pacemaker insertion      Family History  Problem Relation Age of Onset  . Cancer Father     History  Substance Use Topics  . Smoking status: Current Every Day Smoker -- 0.50 packs/day for 65 years    Types: Cigarettes  . Smokeless tobacco: Never Used  . Alcohol Use: No    OB History   Grav Para Term Preterm Abortions TAB SAB Ect Mult Living                  Review of Systems  Constitutional: Negative for fever.  Cardiovascular: Negative for chest pain.  Gastrointestinal: Negative for vomiting, abdominal pain and diarrhea.  Genitourinary: Negative for dysuria.  Skin: Positive for wound.  Neurological: Negative for dizziness, tremors, seizures, syncope, weakness, light-headedness, numbness and headaches.  All other systems reviewed and are negative.    Allergies  Norvasc and Peanut-containing drug products  Home Medications   Current Outpatient Rx  Name  Route  Sig  Dispense  Refill  . acetaminophen-codeine (TYLENOL #3)  300-30 MG per tablet   Oral   Take 1 tablet by mouth every 6 (six) hours as needed. For pain   30 tablet   0   . ALPRAZolam (XANAX) 0.5 MG tablet   Oral   Take 0.5 mg by mouth daily as needed. For anxiety         . aspirin 81 MG chewable tablet   Oral   Chew 81 mg by mouth daily.         . chlorthalidone (HYGROTON) 25 MG tablet   Oral   Take 25 mg by mouth daily.         . cilostazol (PLETAL) 100 MG tablet   Oral   Take 100 mg by mouth 2 (two) times daily.         . cloNIDine (CATAPRES - DOSED IN MG/24 HR) 0.3 mg/24hr   Transdermal   Place 1 patch onto the skin once a week.         . hydrALAZINE (APRESOLINE) 50 MG tablet   Oral   Take 50 mg by mouth 4 (four) times  daily.          . insulin detemir (LEVEMIR) 100 UNIT/ML injection   Subcutaneous   Inject 5 Units into the skin daily.   10 mL   0   . losartan (COZAAR) 100 MG tablet   Oral   Take 50 mg by mouth daily.         . metoCLOPramide (REGLAN) 5 MG tablet   Oral   Take 5 mg by mouth 4 (four) times daily.         . metoprolol (LOPRESSOR) 100 MG tablet   Oral   Take 100 mg by mouth 2 (two) times daily.         Marland Kitchen omega-3 acid ethyl esters (LOVAZA) 1 G capsule   Oral   Take 1 g by mouth 2 (two) times daily.           BP 196/59  Pulse 60  Temp(Src) 98 F (36.7 C) (Oral)  Resp 19  SpO2 96%  Physical Exam  Nursing note and vitals reviewed. Constitutional: She appears well-developed and well-nourished. No distress.  HENT:  Head: Normocephalic.  Right Ear: External ear normal.  Left Ear: External ear normal.  Laceration right occipital region  Eyes: Conjunctivae are normal. Right eye exhibits no discharge. Left eye exhibits no discharge. No scleral icterus.  Neck: Neck supple. No tracheal deviation present.  No cervical spine tenderness, patient is wearing a cervical spine collar  Cardiovascular: Normal rate, regular rhythm and intact distal pulses.   Pulmonary/Chest: Effort normal and breath sounds normal. No stridor. No respiratory distress. She has no wheezes. She has no rales.  Abdominal: Soft. Bowel sounds are normal. She exhibits no distension. There is no tenderness. There is no rebound and no guarding.  Musculoskeletal: She exhibits no edema and no tenderness.  Neurological: She is alert. She has normal strength. No sensory deficit. Cranial nerve deficit:  no gross defecits noted. She exhibits normal muscle tone. She displays no seizure activity. Coordination normal.  Skin: Skin is warm and dry. No rash noted.  Psychiatric: She has a normal mood and affect.    ED Course  LACERATION REPAIR Date/Time: 04/30/2012 5:36 PM Performed by: Linwood Dibbles R Authorized by:  Linwood Dibbles R Consent: Verbal consent obtained. Risks and benefits: risks, benefits and alternatives were discussed Consent given by: patient (family) Body area: head/neck Location details: scalp Laceration length:  3 cm Foreign bodies: no foreign bodies Tendon involvement: none Nerve involvement: none Vascular damage: no Anesthesia: local infiltration Local anesthetic: lidocaine 1% with epinephrine Anesthetic total: 4 ml Patient sedated: no Irrigation solution: saline Amount of cleaning: standard Debridement: none Degree of undermining: none Skin closure: staples (4) Technique: simple Approximation: close Approximation difficulty: simple   (including critical care time) Note: Family refused IV insertion by ED nurses. Requested IV team  IV fluids, insulin and potassium ordered.  Potassium and IV fluids first prior to insulin infusion considering her potassium  CRITICAL CARE Performed by: Linwood Dibbles R Total critical care time: 35 Critical care time was exclusive of separately billable procedures and treating other patients. Critical care was necessary to treat or prevent imminent or life-threatening deterioration. Critical care was time spent personally by me on the following activities: development of treatment plan with patient and/or surrogate as well as nursing, discussions with consultants, evaluation of patient's response to treatment, examination of patient, obtaining history from patient or surrogate, ordering and performing treatments and interventions, ordering and review of laboratory studies, ordering and review of radiographic studies, pulse oximetry and re-evaluation of patient's condition.    Labs Reviewed  CBC WITH DIFFERENTIAL - Abnormal; Notable for the following:    WBC 3.2 (*)    Platelets 95 (*)    All other components within normal limits  BASIC METABOLIC PANEL - Abnormal; Notable for the following:    Sodium 125 (*)    Potassium 2.3 (*)    Chloride 86  (*)    Glucose, Bld 838 (*)    GFR calc non Af Amer 79 (*)    All other components within normal limits  GLUCOSE, CAPILLARY - Abnormal; Notable for the following:    Glucose-Capillary >600 (*)    All other components within normal limits   Ct Head Wo Contrast  04/30/2012  *RADIOLOGY REPORT*  Clinical Data:   Trauma.  Laceration.  CT HEAD WITHOUT CONTRAST  Technique: Contiguous axial images were obtained from the base of the skull through the vertex without contrast  Comparison: Head CT of 03/07/2012 and.  No prior cervical spine imaging.  The CT angiography of the neck of 10/17/2011 is reviewed.  Findings:  Bone windows demonstrate probable staples about the left posterior scalp. No skull fracture.  Clear paranasal sinuses and mastoid air cells.  Soft tissue windows demonstrate advanced cerebral atrophy.  Similar mild low density in the periventricular white matter likely related to small vessel disease.  Remote right brain stem lacunar infarct on image 11.  Remote left basal ganglia lacunar infarcts.  No  mass lesion, hemorrhage, hydrocephalus, acute infarct, intra-axial, or extra-axial fluid collection.  IMPRESSION:  1.  No acute or post-traumatic deformity. 2.  Cerebral atrophy with chronic ischemic changes.  CT CERVICAL SPINE WITHOUT CONTRAST  Technique: Continous axial images were obtained of the cervical spine without contrast.  Sagittal and coronal reformats were constructed.  Findings:  Spinal visualization through bottom of T2. Prevertebral soft tissues are within normal limits.  Dense atherosclerosis in the left common carotid artery.  Probable right-sided laryngeal diverticulum.  Sub centimeter left sided thyroid hypoattenuating nodule is nonspecific.  A vascular stent the right-sided thoracic inlet, suboptimally evaluated.  Centrilobular emphysema at the apices, but no pneumothorax.  Spondylosis, resulting in left-sided neural foraminal narrowing at C5-C6 and C6-C7.  Skull base intact.   Maintenance of vertebral body height.  Advanced degenerative disc disease at C5-C6.  Degenerative changes also are seen at the C1-C2 reticulation.  Facets are well-aligned.  .  IMPRESSION: 1.  No acute fracture or subluxation. 2.  Spondylosis, most marked at C5-C6, resulting in neural foraminal narrowing.   Original Report Authenticated By: Jeronimo Greaves, M.D.    Ct Cervical Spine Wo Contrast  04/30/2012  *RADIOLOGY REPORT*  Clinical Data:   Trauma.  Laceration.  CT HEAD WITHOUT CONTRAST  Technique: Contiguous axial images were obtained from the base of the skull through the vertex without contrast  Comparison: Head CT of 03/07/2012 and.  No prior cervical spine imaging.  The CT angiography of the neck of 10/17/2011 is reviewed.  Findings:  Bone windows demonstrate probable staples about the left posterior scalp. No skull fracture.  Clear paranasal sinuses and mastoid air cells.  Soft tissue windows demonstrate advanced cerebral atrophy.  Similar mild low density in the periventricular white matter likely related to small vessel disease.  Remote right brain stem lacunar infarct on image 11.  Remote left basal ganglia lacunar infarcts.  No  mass lesion, hemorrhage, hydrocephalus, acute infarct, intra-axial, or extra-axial fluid collection.  IMPRESSION:  1.  No acute or post-traumatic deformity. 2.  Cerebral atrophy with chronic ischemic changes.  CT CERVICAL SPINE WITHOUT CONTRAST  Technique: Continous axial images were obtained of the cervical spine without contrast.  Sagittal and coronal reformats were constructed.  Findings:  Spinal visualization through bottom of T2. Prevertebral soft tissues are within normal limits.  Dense atherosclerosis in the left common carotid artery.  Probable right-sided laryngeal diverticulum.  Sub centimeter left sided thyroid hypoattenuating nodule is nonspecific.  A vascular stent the right-sided thoracic inlet, suboptimally evaluated.  Centrilobular emphysema at the apices, but no  pneumothorax.  Spondylosis, resulting in left-sided neural foraminal narrowing at C5-C6 and C6-C7.  Skull base intact.  Maintenance of vertebral body height.  Advanced degenerative disc disease at C5-C6.  Degenerative changes also are seen at the C1-C2 reticulation. Facets are well-aligned.  .  IMPRESSION: 1.  No acute fracture or subluxation. 2.  Spondylosis, most marked at C5-C6, resulting in neural foraminal narrowing.   Original Report Authenticated By: Jeronimo Greaves, M.D.      1. Hyperglycemia without ketosis   2. Hypokalemia   3. Scalp laceration       MDM  Pt states she had a mechanical fall.  She sustained a scalp laceration but fortunately no other signs of severe injury.  Pt once again has recurrent hyperglycemia.  i have reviewed her records and this has happened previously to her in the past.  No evidence of DKA.  Will continue IV fluids, plan on insulin infusion.  IV and PO potassium ordered for her hypokalemia.  Will start fluids and potassium first followed by her insulin.    Pt will be admitted for further treatment.       Celene Kras, MD 04/30/12 [phone]

## 2012-04-30 NOTE — ED Notes (Signed)
She fell back tripped on her flip-flops.  Fell onto tile floor. No loc.  Superficial laceration 1 inch, reported by Paramedic.   Pt. Arrived with dressing intact.

## 2012-04-30 NOTE — ED Notes (Signed)
Iv nurse paged and she came.  Pt. Was going to CT.  Repaged Iv nurse to let her know pt. Has returned

## 2012-05-01 DIAGNOSIS — N179 Acute kidney failure, unspecified: Secondary | ICD-10-CM

## 2012-05-01 DIAGNOSIS — E1100 Type 2 diabetes mellitus with hyperosmolarity without nonketotic hyperglycemic-hyperosmolar coma (NKHHC): Secondary | ICD-10-CM | POA: Diagnosis present

## 2012-05-01 LAB — BASIC METABOLIC PANEL
CO2: 33 mEq/L — ABNORMAL HIGH (ref 19–32)
Calcium: 8.3 mg/dL — ABNORMAL LOW (ref 8.4–10.5)
Calcium: 7.7 mg/dL — ABNORMAL LOW (ref 8.4–10.5)
Chloride: 95 mEq/L — ABNORMAL LOW (ref 96–112)
Chloride: 98 mEq/L (ref 96–112)
Creatinine, Ser: 0.79 mg/dL (ref 0.50–1.10)
GFR calc Af Amer: >90 mL/min (ref >90)
GFR calc Af Amer: >90 mL/min (ref >90)
GFR calc Af Amer: 79 mL/min — ABNORMAL LOW (ref >90)
GFR calc non Af Amer: 83 mL/min — ABNORMAL LOW (ref >90)
GFR calc non Af Amer: 84 mL/min — ABNORMAL LOW (ref >90)
GFR calc non Af Amer: 68 mL/min — ABNORMAL LOW (ref >90)
Glucose, Bld: 127 mg/dL — ABNORMAL HIGH (ref 70–99)
Glucose, Bld: 159 mg/dL — ABNORMAL HIGH (ref 70–99)
Potassium: 2.9 mEq/L — ABNORMAL LOW (ref 3.5–5.1)
Potassium: 3.9 mEq/L (ref 3.5–5.1)
Sodium: 130 mEq/L — ABNORMAL LOW (ref 135–145)
Sodium: 131 mEq/L — ABNORMAL LOW (ref 135–145)

## 2012-05-01 LAB — GLUCOSE, CAPILLARY
Glucose-Capillary: 156 mg/dL — ABNORMAL HIGH (ref 70–99)
Glucose-Capillary: 148 mg/dL — ABNORMAL HIGH (ref 70–99)
Glucose-Capillary: 188 mg/dL — ABNORMAL HIGH (ref 70–99)

## 2012-05-01 LAB — HEPATIC FUNCTION PANEL
ALT: 93 U/L — ABNORMAL HIGH (ref 0–35)
AST: 65 U/L — ABNORMAL HIGH (ref 0–37)
Albumin: 2.3 g/dL — ABNORMAL LOW (ref 3.5–5.2)
Bilirubin, Direct: 0.1 mg/dL (ref 0.0–0.3)
Total Bilirubin: 0.4 mg/dL (ref 0.3–1.2)

## 2012-05-01 LAB — PHOSPHORUS: Phosphorus: 1.8 mg/dL — ABNORMAL LOW (ref 2.3–4.6)

## 2012-05-01 MED ORDER — INSULIN GLARGINE 100 UNIT/ML ~~LOC~~ SOLN: 5.0000 [IU] | Freq: Every day | SUBCUTANEOUS | Status: DC

## 2012-05-01 MED ORDER — POTASSIUM CHLORIDE IN NACL 40-0.9 MEQ/L-% IV SOLN
INTRAVENOUS | Status: DC
  Administered 2012-05-01: 20:00:00 via INTRAVENOUS
  Filled 2012-05-01 (×3): qty 1000

## 2012-05-01 MED ORDER — POTASSIUM CHLORIDE CRYS ER 20 MEQ PO TBCR
40.0000 meq | EXTENDED_RELEASE_TABLET | Freq: Two times a day (BID) | ORAL | Status: DC
  Administered 2012-05-01 – 2012-05-02 (×2): 40 meq via ORAL
  Filled 2012-05-01 (×2): qty 2

## 2012-05-01 MED ORDER — POTASSIUM CHLORIDE 10 MEQ/100ML IV SOLN
10.0000 meq | INTRAVENOUS | Status: AC
  Administered 2012-05-01 (×5): 10 meq via INTRAVENOUS
  Filled 2012-05-01 (×5): qty 100

## 2012-05-01 MED ORDER — DEXTROSE-NACL 5-0.45 % IV SOLN
INTRAVENOUS | Status: DC
  Administered 2012-05-01: 01:00:00 via INTRAVENOUS

## 2012-05-01 MED ORDER — GLUCERNA SHAKE PO LIQD
237.0000 mL | Freq: Three times a day (TID) | ORAL | Status: DC
  Administered 2012-05-01 – 2012-05-02 (×3): 237 mL via ORAL

## 2012-05-01 MED ORDER — POTASSIUM CHLORIDE IN NACL 40-0.9 MEQ/L-% IV SOLN
INTRAVENOUS | Status: DC
  Administered 2012-05-01: 07:00:00 via INTRAVENOUS
  Filled 2012-05-01 (×2): qty 1000

## 2012-05-01 MED ORDER — POTASSIUM PHOSPHATE DIBASIC 3 MMOLE/ML IV SOLN
10.0000 mmol | Freq: Once | INTRAVENOUS | Status: AC
  Administered 2012-05-01: 10 mmol via INTRAVENOUS
  Filled 2012-05-01: qty 3.33

## 2012-05-01 MED ORDER — INSULIN GLARGINE 100 UNIT/ML ~~LOC~~ SOLN
10.0000 [IU] | Freq: Every day | SUBCUTANEOUS | Status: DC
  Administered 2012-05-01 – 2012-05-02 (×2): 10 [IU] via SUBCUTANEOUS

## 2012-05-01 MED ORDER — INSULIN ASPART 100 UNIT/ML ~~LOC~~ SOLN
0.0000 [IU] | SUBCUTANEOUS | Status: DC
  Administered 2012-05-01: 2 [IU] via SUBCUTANEOUS
  Administered 2012-05-01 (×2): 1 [IU] via SUBCUTANEOUS
  Administered 2012-05-02: 5 [IU] via SUBCUTANEOUS
  Administered 2012-05-02: 2 [IU] via SUBCUTANEOUS

## 2012-05-01 NOTE — Progress Notes (Signed)
INITIAL NUTRITION ASSESSMENT  DOCUMENTATION CODES Per approved criteria  -Not Applicable   INTERVENTION: 1. Glucerna Shake po TID, each supplement provides 220 kcal and 10 grams of protein.  2. RD will continue to follow    NUTRITION DIAGNOSIS: Inadequate oral intake related to poor appetite as evidenced by weight loss .   Goal: PO intake to meet >/=90% estimated nutrition needs  Monitor:  PO intake, weight trends, I/O's, labs   Reason for Assessment: Malnutrition screening tool   79 y.o. female  Admitting Dx: s/p fall   ASSESSMENT: Pt admitted after a fall at home. In ED pt found with hyperglycemia, hypokalemia, high blood pressure. Pt with hx of DM, unsure compliance with medications and blood sugar checks.   RD pulled to pt for malnutrition screening tool score of 2, unintentional weight loss and poor po intake PTA. Weight hx shows weight is trending down.   Pt reports 24 lb weight loss in about 10 months, not significant. Pt reports decreased appetite. Some weight loss may be related to uncontrolled blood sugars.   Height: Ht Readings from Last 1 Encounters:  04/30/12 5\' 4"  (1.626 m)    Weight: Wt Readings from Last 1 Encounters:  04/30/12 116 lb 13.5 oz (53 kg)    Ideal Body Weight: 120 lbs   % Ideal Body Weight: 97%  Wt Readings from Last 10 Encounters:  04/30/12 116 lb 13.5 oz (53 kg)  03/04/12 110 lb 7.2 oz (50.1 kg)  11/13/11 117 lb (53.071 kg)  10/17/11 114 lb 6.7 oz (51.9 kg)  07/31/11 123 lb 7.3 oz (56 kg)  07/31/11 123 lb 7.3 oz (56 kg)  07/06/11 147 lb (66.679 kg)  07/06/11 147 lb (66.679 kg)  06/29/11 127 lb 6.8 oz (57.8 kg)  06/29/11 127 lb 6.8 oz (57.8 kg)    Usual Body Weight: 140 lbs per pt report  % Usual Body Weight: 83%  BMI:  Body mass index is 20.05 kg/(m^2). WNL  Estimated Nutritional Needs: Kcal: 1300-1500 Protein: 55-65 gm  Fluid: >/=1.5 L/day  Skin: laceration to head from fall  Diet Order: Carb Control 45% meal  completion  EDUCATION NEEDS: -No education needs identified at this time    Intake/Output Summary (Last 24 hours) at 05/01/12 1223 Last data filed at 05/01/12 0843  Gross per 24 hour  Intake 908.33 ml  Output   1900 ml  Net -991.67 ml    Last BM: PTA   Labs:   Recent Labs Lab 05/01/12 0020 05/01/12 0602 05/01/12 0947  NA 134* 130* 130*  K 2.5* 2.8* 2.9*  CL 97 96 95*  CO2 33* 29 30  BUN 10 9 9   CREATININE 0.79 0.67 0.65  CALCIUM 8.3* 7.7* 8.1*  MG  --   --  1.6  PHOS  --   --  1.8*  GLUCOSE 127* 194* 190*    CBG (last 3)   Recent Labs  05/01/12 0401 05/01/12 0749 05/01/12 1141  GLUCAP 148* 148* 103*    Scheduled Meds: . aspirin  81 mg Oral Daily  . chlorthalidone  25 mg Oral Daily  . heparin  5,000 Units Subcutaneous Q8H  . hydrALAZINE  50 mg Oral QID  . insulin aspart  0-9 Units Subcutaneous Q4H  . insulin glargine  10 Units Subcutaneous Daily  . losartan  50 mg Oral Daily  . metoCLOPramide  5 mg Oral QID  . metoprolol  100 mg Oral BID  . omega-3 acid ethyl esters  1 g  Oral BID  . potassium chloride  40 mEq Oral TID  . potassium chloride  40 mEq Oral Once  . sodium chloride  3 mL Intravenous Q12H    Continuous Infusions: . 0.9 % NaCl with KCl 40 mEq / L 100 mL/hr at 05/01/12 9629    Past Medical History  Diagnosis Date  . Diabetes mellitus   . Hypertension   . Coronary artery disease   . Renal disorder   . Herniated lumbar intervertebral disc   . Cataract     cataract surgery scheduled for 03/31/11, 2 - left eye, 1 - right eye  . Renovascular hypertension      s/p left renal artery stent 12/2007.   S/P balloon angioplasty on 02/16/10 for ISR, BP was  controlled well since then.     . Claudication in peripheral vascular disease     02/16/10: Left CIA 9.0x28 Omnilink and REIA 8.0x40 seff expanding Zilver.   right subclavian artery stent 03/18/2008,  . S/P carotid endarterectomy      left carotid endarterectomy  1991; Stroke and TIA in 1999  with right sided weakness, now with residual right arm weakness  . Stroke     TIA  . Peripheral vascular disease   . Anxiety     Past Surgical History  Procedure Laterality Date  . Appendectomy    . Abdominal hysterectomy    . Ureteral stent placement    . Carotid endarterectomy    . Laminectomy    . Back surgery    . Colonoscopy  07/30/2011    Procedure: COLONOSCOPY;  Surgeon: Louis Meckel, MD;  Location: [facility] ENDOSCOPY;  Service: Endoscopy;  Laterality: N/A;  gi bleed  . Esophagogastroduodenoscopy  07/30/2011    Procedure: ESOPHAGOGASTRODUODENOSCOPY (EGD);  Surgeon: Louis Meckel, MD;  Location: [facility] ENDOSCOPY;  Service: Endoscopy;  Laterality: N/A;  . Pacemaker insertion      Clarene Duke RD, LDN Pager [phone] After Hours pager [phone]

## 2012-05-01 NOTE — Progress Notes (Signed)
Carmen [name] [id number] Admitted to [id number]: 04/30/12 20:00 Attending Provider: Clydia Llano, MD    Carmen [name] is a 79 y.o. female patient admitted from ED awake, alert  & orientated  X 3,  DNR, VSS - Blood pressure 173/64, pulse 60, temperature 99 F (37.2 C), temperature source Oral, resp. rate 20, height 5\' 4"  (1.626 m), weight 53 kg (116 lb 13.5 oz), SpO2 95.00%.RA, no c/o shortness of breath, no c/o chest pain, no distress noted. Tele # [id number] placed and pt is currently running: A-paced   IV sites WDL:   with a transparent dsg that's clean dry and intact.  Allergies:   Allergies  Allergen Reactions  . Norvasc (Amlodipine Besylate) Swelling  . Lasix (Furosemide)     Caused a problem with her kidneys  . Peanut-Containing Drug Products Other (See Comments)    Cause my stomach to hurt     Past Medical History  Diagnosis Date  . Diabetes mellitus   . Hypertension   . Coronary artery disease   . Renal disorder   . Herniated lumbar intervertebral disc   . Cataract     cataract surgery scheduled for 03/31/11, 2 - left eye, 1 - right eye  . Renovascular hypertension      s/p left renal artery stent 12/2007.   S/P balloon angioplasty on 02/16/10 for ISR, BP was  controlled well since then.     . Claudication in peripheral vascular disease     02/16/10: Left CIA 9.0x28 Omnilink and REIA 8.0x40 seff expanding Zilver.   right subclavian artery stent 03/18/2008,  . S/P carotid endarterectomy      left carotid endarterectomy  1991; Stroke and TIA in 1999 with right sided weakness, now with residual right arm weakness  . Stroke     TIA  . Peripheral vascular disease   . Anxiety     History:  obtained from patient  Pt orientation to unit, room and routine. Information packet given to patient and safety video needs to be viewed.  Admission INP armband ID verified with patient, and in place. SR up x 2, fall risk assessment complete with Patient verbalizing understanding of risks  associated with falls. Pt verbalizes an understanding of how to use the call bell and to call for help before getting out of bed.  Skin, dry with abrasion on rt scapula, also staples on rt parietal area of skull with no drainage or swelling noted.    Will cont to monitor and assist as needed.  Elisha Ponder, RN 05/01/2012 1:27 AM

## 2012-05-01 NOTE — Progress Notes (Signed)
CRITICAL VALUE ALERT  Critical value received:  K+ 2.2  Date of notification:  04/30/12   Time of notification:  21:20  Critical value read back: yes  Nurse who received alert:  Timofey Carandang, Joan Mayans  MD notified (1st page):  Claiborne Billings, NP   Time of first page:  21:20  MD notified (2nd page): Claiborne Billings, NP  Time of second page: 21:39  Responding MD:  Claiborne Billings, NP  Time MD responded:  21:42  No new orders received due to patient still needed two more runs of IV potassium and recent dose of oral suplimentation.  Will continue to monitor patient.

## 2012-05-01 NOTE — Evaluation (Signed)
Physical Therapy Evaluation Patient Details Name: Carmen [name] MRN: [id number] DOB: Jul 03, 1933 Today's Date: 05/01/2012 Time: 9147-8295 PT Time Calculation (min): 35 min  PT Assessment / Plan / Recommendation Clinical Impression  Pt s/p diabetes, hyperglycemia and hypokalemia with decr mobility secondary to decr endurance and decr balance. Will beenfit from PT to address endurance and balance issues.  HAs family support so hopeful that pt will be able to d/c home with 24 hour care and HH f/u as well as probably a RW and 3N1.    PT Assessment  Patient needs continued PT services    Follow Up Recommendations  Home health PT;Supervision/Assistance - 24 hour                Equipment Recommendations  Rolling walker with 5" wheels;Other (comment) (3N1)         Frequency Min 3X/week    Precautions / Restrictions Precautions Precautions: Fall Restrictions Weight Bearing Restrictions: No   Pertinent Vitals/Pain VSS, No pain      Mobility  Bed Mobility Bed Mobility: Rolling Left;Left Sidelying to Sit;Sitting - Scoot to Edge of Bed Rolling Left: 3: Mod assist Left Sidelying to Sit: 3: Mod assist;With rails;HOB flat Sitting - Scoot to Edge of Bed: 3: Mod assist Details for Bed Mobility Assistance: Assist for elevation of trunk and for sequencing movement. needed incr time. Transfers Transfers: Sit to Stand;Stand to [employer] Transfers Sit to Stand: 4: Min assist;With upper extremity assist;With armrests;From bed Stand to Sit: 4: Min assist;With upper extremity assist;With armrests;To chair/3-in-1 Stand Pivot Transfers: 4: Min assist Details for Transfer Assistance: Pt needed cues for hand placement.  Pt needed assist for anterior translation of pelvis for sit to stand as well with pt not achieving full upright posture even with standing.  Pt kept trunk, hips and knees slightly bent and pivoted reaching for furniture and needed assist to safely stand pivot around to chair  taking quickened steps and widening BOS for stability.   Ambulation/Gait Ambulation/Gait Assistance: Not tested (comment) Stairs: No Wheelchair Mobility Wheelchair Mobility: No         PT Diagnosis: Generalized weakness  PT Problem List: Decreased activity tolerance;Decreased balance;Decreased mobility;Decreased knowledge of precautions;Decreased safety awareness;Decreased knowledge of use of DME PT Treatment Interventions: DME instruction;Gait training;Functional mobility training;Stair training;Therapeutic activities;Therapeutic exercise;Balance training;Wheelchair mobility training;Patient/family education   PT Goals Acute Rehab PT Goals PT Goal Formulation: With patient Time For Goal Achievement: 05/15/12 Potential to Achieve Goals: Good Pt will go Supine/Side to Sit: Independently PT Goal: Supine/Side to Sit - Progress: Goal set today Pt will go Sit to Stand: Independently PT Goal: Sit to Stand - Progress: Goal set today Pt will Transfer Bed to Chair/Chair to Bed: with supervision PT Transfer Goal: Bed to Chair/Chair to Bed - Progress: Goal set today Pt will Ambulate: 16 - 50 feet;with supervision;with least restrictive assistive device Pt will Go Up / Down Stairs: 1-2 stairs;with supervision;with least restrictive assistive device PT Goal: Up/Down Stairs - Progress: Goal set today  Visit Information  Last PT Received On: 05/01/12 Assistance Needed: +1    Subjective Data  Subjective: "I want to try."  Pt difficuly to understand as she talks quietly and mumbles at times. 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 difficulties    Copywriter, advertising Overall Cognitive Status: Appears within functional limits for tasks assessed/performed Arousal/Alertness: Awake/alert Orientation Level: Appears intact for tasks assessed Behavior During Session: [facility] for tasks performed    Extremity/Trunk Assessment Right Lower Extremity Assessment RLE ROM/Strength/Tone: [facility] for tasks assessed Left Lower Extremity Assessment LLE ROM/Strength/Tone: [facility] for tasks assessed Trunk Assessment Trunk Assessment: Kyphotic   Balance Static Sitting Balance Static Sitting - Balance Support: Bilateral upper extremity supported;Feet supported Static Sitting - Level of Assistance: 4: Min assist Static Sitting - Comment/# of Minutes: 3 minutes at EOB Static Standing Balance Static Standing - Balance Support: Bilateral upper extremity supported;During functional activity Static Standing - Level of Assistance: 3: Mod assist Static Standing - Comment/# of Minutes: Needing mod assist for stability for static stance as pt unsteady on feet and needing wide BOS for steadiness.    End of Session PT - End of Session Equipment Utilized During Treatment: Gait belt Activity Tolerance: Patient limited by fatigue Patient left: in chair;with call bell/phone within reach Nurse Communication: Mobility status       INGOLD,Trenise Turay 05/01/2012, 3:53 PM [facility] [phone] (pager)

## 2012-05-01 NOTE — Progress Notes (Signed)
Addendum  Patient seen and examined, chart and data base reviewed.  I agree with the above assessment and plan.  For full details please see Mrs. Algis Downs PA note.  Hyperosmolar hyperglycemic state, CBGs are controlled now, started on subcutaneous insulin.   Clint Lipps, MD [facility] Pager: [phone] 05/01/2012, 4:23 PM

## 2012-05-01 NOTE — Progress Notes (Signed)
[facility] PROGRESS NOTE  Carmen [name] UVO:[id number] DOB: June 01, 1933 DOA: 04/30/2012 PCP: Gwynneth Aliment, MD  Assessment/Plan:  Hyperosmolar hyperglycemic state with dehydration:  No history, signs or symptoms to suggest infection, neurologic or cardiac event. Based on  interview compliance is questioned.  Patient started on Lantus and sliding scale insulin. CBGs have normalized. Diabetic educator consultation requested.  Hypokalemia and  Hypophosphatemia Repleting potassium and IV fluids and oral supplementation. Repleting phosphorus in the IV. Recheck labs in a.m.  Malignant hypertension:  Long-standing and currently asymptomatic. Continue outpatient medications (clonidine, metoprolol, losartan, hydralazine) ,  Add PRN  IV hydralazine.  blood pressure trending down as glucose and electrolytes normalized.   Mechanical fall with small laceration head: No loss of consciousness or syncope. Likely related to dehydration and  hyperglycemia problem. PT evaluation pending.   DM uncontrolled, hemoglobin A1c 11.1 02/2012:  Was transitioned from the glucose stabilizer tto long acting insulin. Now stable.  Renovascular HTN bilateral by recent CTA, also with stenoses of SMA and IMA and bilateral iliac occlusive disease. Followup with Dr. Jacinto Halim as an outpatient. Follow-up with [facility] as outpatient for consideration of ABI.  Chronic thrombocytopenia:  stable.   History of CAD:  Asymptomatic. Continue aspirin, cardiac medications  S/p carotid endarterectomy:  left carotid endarterectomy 1991; Stroke and TIA in 1999 with right sided weakness, now with residual right arm weakness    Code Status: DO NOT RESUSCITATE Family Communication:  Disposition Plan: To be determined   Consultants:    Procedures:    Antibiotics:    HPI/Subjective: Patient with a recent history of severe hypoglycemia in December of 2013. She now presents with a CBG of 800+ and hypertensive crisis.   After being on the glucose stabilizer and receiving IV fluids overnight she tells me she feels much better.  Objective: Filed Vitals:   04/30/12 1933 04/30/12 2104 05/01/12 0519 05/01/12 1253  BP: 205/68 173/64 168/63 154/70  Pulse: 60 60 60 60  Temp: 98.2 F (36.8 C) 99 F (37.2 C) 98 F (36.7 C) 98.6 F (37 C)  TempSrc: Oral Oral Oral Oral  Resp: 18 20 20 20   Height: 5\' 4"  (1.626 m)     Weight: 53 kg (116 lb 13.5 oz)     SpO2: 97% 95% 96% 97%    Intake/Output Summary (Last 24 hours) at 05/01/12 1426 Last data filed at 05/01/12 1254  Gross per 24 hour  Intake 908.33 ml  Output   2200 ml  Net -1291.67 ml   Filed Weights   04/30/12 1933  Weight: 53 kg (116 lb 13.5 oz)    Exam:   General:  frail, elderly, pleasant, African American female, cachectic, appears dry  Cardiovascular: bradycardic, slight systolic murmur, no obvious rubs or gallops, no lower extremity edema  Respiratory: clear to auscultation no wheezes crackles or rales. No accessory muscle movement.  Abdomen: thin, soft, nontender, nondistended, positive bowel sounds  extremities: She has an old ulceration on both the heel and second digit of her right foot. +1-2 edema bilaterally  Skin: Appears dry and flaky. No bruising or other lesions noted  Data Reviewed: Basic Metabolic Panel:  Recent Labs Lab 04/30/12 1519 04/30/12 2030 05/01/12 0020 05/01/12 0602 05/01/12 0947  NA 125* 132* 134* 130* 130*  K 2.3* 2.2* 2.5* 2.8* 2.9*  CL 86* 94* 97 96 95*  CO2 30 31 33* 29 30  GLUCOSE 838* 412* 127* 194* 190*  BUN 12 10 10 9 9   CREATININE 0.79 0.73 0.79 0.67  0.65  CALCIUM 8.7 8.5 8.3* 7.7* 8.1*  MG  --   --   --   --  1.6  PHOS  --   --   --   --  1.8*   Liver Function Tests:  Recent Labs Lab 05/01/12 0947  AST 65*  ALT 93*  ALKPHOS 164*  BILITOT 0.4  PROT 4.8*  ALBUMIN 2.3*   CBC:  Recent Labs Lab 04/30/12 1519  WBC 3.2*  NEUTROABS 1.9  HGB 14.3  HCT 40.8  MCV 94.0  PLT 95*    CBG:  Recent Labs Lab 05/01/12 0156 05/01/12 0254 05/01/12 0401 05/01/12 0749 05/01/12 1141  GLUCAP 106* 156* 148* 148* 103*     Studies: Ct Head Wo Contrast  04/30/2012  *RADIOLOGY REPORT*  Clinical Data:   Trauma.  Laceration.  CT HEAD WITHOUT CONTRAST  Technique: Contiguous axial images were obtained from the base of the skull through the vertex without contrast  Comparison: Head CT of 03/07/2012 and.  No prior cervical spine imaging.  The CT angiography of the neck of 10/17/2011 is reviewed.  Findings:  Bone windows demonstrate probable staples about the left posterior scalp. No skull fracture.  Clear paranasal sinuses and mastoid air cells.  Soft tissue windows demonstrate advanced cerebral atrophy.  Similar mild low density in the periventricular white matter likely related to small vessel disease.  Remote right brain stem lacunar infarct on image 11.  Remote left basal ganglia lacunar infarcts.  No  mass lesion, hemorrhage, hydrocephalus, acute infarct, intra-axial, or extra-axial fluid collection.  IMPRESSION:  1.  No acute or post-traumatic deformity. 2.  Cerebral atrophy with chronic ischemic changes.  CT CERVICAL SPINE WITHOUT CONTRAST  Technique: Continous axial images were obtained of the cervical spine without contrast.  Sagittal and coronal reformats were constructed.  Findings:  Spinal visualization through bottom of T2. Prevertebral soft tissues are within normal limits.  Dense atherosclerosis in the left common carotid artery.  Probable right-sided laryngeal diverticulum.  Sub centimeter left sided thyroid hypoattenuating nodule is nonspecific.  A vascular stent the right-sided thoracic inlet, suboptimally evaluated.  Centrilobular emphysema at the apices, but no pneumothorax.  Spondylosis, resulting in left-sided neural foraminal narrowing at C5-C6 and C6-C7.  Skull base intact.  Maintenance of vertebral body height.  Advanced degenerative disc disease at C5-C6.  Degenerative  changes also are seen at the C1-C2 reticulation. Facets are well-aligned.  .  IMPRESSION: 1.  No acute fracture or subluxation. 2.  Spondylosis, most marked at C5-C6, resulting in neural foraminal narrowing.   Original Report Authenticated By: Jeronimo Greaves, M.D.    Ct Cervical Spine Wo Contrast  04/30/2012  *RADIOLOGY REPORT*  Clinical Data:   Trauma.  Laceration.  CT HEAD WITHOUT CONTRAST  Technique: Contiguous axial images were obtained from the base of the skull through the vertex without contrast  Comparison: Head CT of 03/07/2012 and.  No prior cervical spine imaging.  The CT angiography of the neck of 10/17/2011 is reviewed.  Findings:  Bone windows demonstrate probable staples about the left posterior scalp. No skull fracture.  Clear paranasal sinuses and mastoid air cells.  Soft tissue windows demonstrate advanced cerebral atrophy.  Similar mild low density in the periventricular white matter likely related to small vessel disease.  Remote right brain stem lacunar infarct on image 11.  Remote left basal ganglia lacunar infarcts.  No  mass lesion, hemorrhage, hydrocephalus, acute infarct, intra-axial, or extra-axial fluid collection.  IMPRESSION:  1.  No acute or  post-traumatic deformity. 2.  Cerebral atrophy with chronic ischemic changes.  CT CERVICAL SPINE WITHOUT CONTRAST  Technique: Continous axial images were obtained of the cervical spine without contrast.  Sagittal and coronal reformats were constructed.  Findings:  Spinal visualization through bottom of T2. Prevertebral soft tissues are within normal limits.  Dense atherosclerosis in the left common carotid artery.  Probable right-sided laryngeal diverticulum.  Sub centimeter left sided thyroid hypoattenuating nodule is nonspecific.  A vascular stent the right-sided thoracic inlet, suboptimally evaluated.  Centrilobular emphysema at the apices, but no pneumothorax.  Spondylosis, resulting in left-sided neural foraminal narrowing at C5-C6 and C6-C7.   Skull base intact.  Maintenance of vertebral body height.  Advanced degenerative disc disease at C5-C6.  Degenerative changes also are seen at the C1-C2 reticulation. Facets are well-aligned.  .  IMPRESSION: 1.  No acute fracture or subluxation. 2.  Spondylosis, most marked at C5-C6, resulting in neural foraminal narrowing.   Original Report Authenticated By: Jeronimo Greaves, M.D.     Scheduled Meds: . aspirin  81 mg Oral Daily  . chlorthalidone  25 mg Oral Daily  . feeding supplement  237 mL Oral TID BM  . heparin  5,000 Units Subcutaneous Q8H  . hydrALAZINE  50 mg Oral QID  . insulin aspart  0-9 Units Subcutaneous Q4H  . insulin glargine  10 Units Subcutaneous Daily  . losartan  50 mg Oral Daily  . metoCLOPramide  5 mg Oral QID  . metoprolol  100 mg Oral BID  . omega-3 acid ethyl esters  1 g Oral BID  . potassium chloride  40 mEq Oral TID  . potassium chloride  40 mEq Oral Once  . potassium phosphate IVPB (mmol)  10 mmol Intravenous Once  . sodium chloride  3 mL Intravenous Q12H   Continuous Infusions: . 0.9 % NaCl with KCl 40 mEq / L 100 mL/hr at 05/01/12 1478    Active Problems:   Hypophosphatemia   Type 2 diabetes mellitus with hyperosmolar nonketotic hyperglycemia   Retrovascular hypertension status post left renal artery stent 2009 and balloon angioplasty for in-stent restenosis 2011   HTN (hypertension), malignant   Hypokalemia   DM (diabetes mellitus), type 2, uncontrolled, periph vascular complic   History of CVA (cerebrovascular accident) with associated mild right upper extremity hemiplegia   Diabetic hyperosmolar non-ketotic state   Thrombocytopenia   Fall     [place], Tiffine Henigan L, [place]  [facility] Pager [phone]. If 8PM-8AM, please contact night-coverage at [email or web address], password [facility] 05/01/2012, 2:26 PM  LOS: 1 day

## 2012-05-01 NOTE — Progress Notes (Signed)
CRITICAL VALUE ALERT  Critical value received:  K+ 2.5  Date of notification:  05/01/12  Time of notification:  01:42  Critical value read back: yes  Nurse who received alert:  Volanda Napoleon  MD notified (1st page):  Claiborne Billings, NP  Time of first page:  01:42  MD notified (2nd page):  Time of second page:  Responding MD:  Claiborne Billings, NP  Time MD responded:  01:43   New orders received for runs of K+.  Will continue to monitor patient.

## 2012-05-01 NOTE — Progress Notes (Signed)
Inpatient Diabetes Program Recommendations  AACE/ADA: New Consensus Statement on Inpatient Glycemic Control (2013)  Target Ranges:  Prepandial:   less than 140 mg/dL      Peak postprandial:   less than 180 mg/dL (1-2 hours)      Critically ill patients:  140 - 180 mg/dL   Reason for Visit: Results for Carmen [name], Carmen [name] (MRN [id number]) as of 05/01/2012 15:43  Ref. Range 04/30/2012 15:10 04/30/2012 18:07 04/30/2012 19:24 04/30/2012 20:32 04/30/2012 21:00 04/30/2012 21:57 04/30/2012 23:03  Glucose-Capillary Latest Range: 70-99 mg/dL >409 (HH) >811 (HH) 914 (H) 365 (H) 317 (H) 278 (H) 223 (H)   Note patient admitted with HHNK.  Note PA's history stating that patient had history of low CBG's.  Attempted to talk to patient.  She states that she doesn't know if she had any low CBG's but "maybe in [place]".  She states that she was taking Levemir 5 units twice a day (not 3 times a day).  She is not sure why her CBG's were high on admission.  CBG's improved.  Patient will likely need change in home medications and maybe home health to assess patients self care at home.  Will follow-up with patient on 05/02/12.

## 2012-05-02 DIAGNOSIS — R7309 Other abnormal glucose: Secondary | ICD-10-CM

## 2012-05-02 LAB — GLUCOSE, CAPILLARY
Glucose-Capillary: 256 mg/dL — ABNORMAL HIGH (ref 70–99)
Glucose-Capillary: 70 mg/dL (ref 70–99)
Glucose-Capillary: 94 mg/dL (ref 70–99)

## 2012-05-02 LAB — CBC
Platelets: 96 10*3/uL — ABNORMAL LOW (ref 150–400)
RBC: 4.01 MIL/uL (ref 3.87–5.11)
RDW: 13.1 % (ref 11.5–15.5)
WBC: 4.9 10*3/uL (ref 4.0–10.5)

## 2012-05-02 LAB — BASIC METABOLIC PANEL
CO2: 27 mEq/L (ref 19–32)
Chloride: 100 mEq/L (ref 96–112)
GFR calc Af Amer: 79 mL/min — ABNORMAL LOW (ref >90)
Potassium: 4.3 mEq/L (ref 3.5–5.1)

## 2012-05-02 LAB — PHOSPHORUS: Phosphorus: 2.5 mg/dL (ref 2.3–4.6)

## 2012-05-02 MED ORDER — INSULIN DETEMIR 100 UNIT/ML ~~LOC~~ SOLN: 10.0000 [IU] | Freq: Every day | SUBCUTANEOUS | Status: DC

## 2012-05-02 NOTE — Discharge Summary (Signed)
Physician Discharge Summary  Carmen [name]:[id number] DOB: 07/21/1933 DOA: 04/30/2012  PCP: Gwynneth Aliment, MD  Admit date: 04/30/2012 Discharge date: 05/02/2012  Time spent: 40 minutes  Recommendations for Outpatient Follow-up:  1. Followup with primary care physician one week  Discharge Diagnoses:  Active Problems:   HTN (hypertension), malignant   Hypokalemia   DM (diabetes mellitus), type 2, uncontrolled, periph vascular complic   Retrovascular hypertension status post left renal artery stent 2009 and balloon angioplasty for in-stent restenosis 2011   History of CVA (cerebrovascular accident) with associated mild right upper extremity hemiplegia   Diabetic hyperosmolar non-ketotic state   Thrombocytopenia   Fall   Hypophosphatemia   Type 2 diabetes mellitus with hyperosmolar nonketotic hyperglycemia   Discharge Condition: Stable  Diet recommendation: Carbohydrate modified diet  Filed Weights   04/30/12 1933  Weight: 53 kg (116 lb 13.5 oz)    History of present illness:  79 year old woman PMH DM presented to the emergency department with history of mechanical fall. Routine laboratory evaluation revealed hyperosmolar hyperglycemic state, profound hypokalemia and elevated blood pressure.  History obtained from patient and daughter at bedside. The patient has felt well lately with no specific complaints. She was wearing flip-flops today and tripped on a kitchen and fell. She denies loss of consciousness or syncope. She sustained a small laceration to her head. It is for this reason that she came to the emergency room. She is very vague in regard to her diabetes, insulin administration and glycemic control. She reports compliance but does not give specific numbers. Per daughter blood sugars have been 200 or more, the frequency of checks is not clear. Her daughter reports that her speech is been slightly slurred for a week or more. No focal neurologic deficit, no difficulty  swallowing or other specific complaints.   Hospital Course:   1. Hyperosmolar hyperglycemic state: Patient presented with blood glucose of 838, started on insulin drip in the emergency department that was continued until the blood sugar controlled to be less than 180. The insulin drip was discontinued and subcutaneous insulin was started. Patient also received aggressive hydration with IV fluids. Patient does not have any symptoms or signs to suggest infection, neurologic or cardiac event. Based on her history patient probably has compliance problems with her Levemir insulin. I spoke with her daughter, and I advised that someone should help her to get her insulin, I simplified her insulin regimen to only one dose of Levemir at night.  2. Hypokalemia: Severe hypokalemia, Likely secondary to the HHS and the osmotic diuresis, this is aggressively treated with IV and oral potassium supplements, potassium today is 4.3.  3. Mechanical fall : Patient came in to the hospital basically because of this, she did tripped at home, she denies any symptoms prior to the fall, denies any loss of consciousness after it patient has minor head laceration secondary to the fall.. This is appears to be pure mechanical fall. Physical therapy evaluated the patient recommended home health PT.   4. Malignant hypertension: Long-standing malignant hypertension currently asymptomatic. Patient to be continued on her home medication. IV hydralazine as needed was started in the hospital but was not needed.  5. Diabetes mellitus uncontrolled, hemoglobin A1c of 11.1 on December 2013. As mentioned above patient compliance with her diabetes medication is questionable. Patient to followup with her primary care physician she will be on Levemir insulin 10 units each bedtime.  6. Chronic thrombocytopenia: This is chronic and stable problem, she denies any history of  bleeding.   Procedures:  None  Consultations:  None  Discharge  Exam: Filed Vitals:   05/01/12 0519 05/01/12 1253 05/01/12 2248 05/02/12 0406  BP: 168/63 154/70 152/49 151/74  Pulse: 60 60 60 64  Temp: 98 F (36.7 C) 98.6 F (37 C) 99 F (37.2 C) 98.2 F (36.8 C)  TempSrc: Oral Oral  Oral  Resp: 20 20 18 20   Height:      Weight:      SpO2: 96% 97% 95% 93%   General: Alert and awake, oriented x3, not in any acute distress. HEENT: anicteric sclera, pupils reactive to light and accommodation, EOMI CVS: S1-S2 clear, no murmur rubs or gallops Chest: clear to auscultation bilaterally, no wheezing, rales or rhonchi Abdomen: soft nontender, nondistended, normal bowel sounds, no organomegaly Extremities: no cyanosis, clubbing or edema noted bilaterally Neuro: Cranial nerves II-XII intact, no focal neurological deficits ]  Discharge Instructions  Discharge Orders   Future Orders Complete By Expires     Diet Carb Modified  As directed     Increase activity slowly  As directed         Medication List    STOP taking these medications       cilostazol 100 MG tablet  Commonly known as:  PLETAL     cloNIDine 0.3 mg/24hr  Commonly known as:  CATAPRES - Dosed in mg/24 hr      TAKE these medications       acetaminophen-codeine 300-30 MG per tablet  Commonly known as:  TYLENOL #3  Take 1 tablet by mouth every 6 (six) hours as needed. For pain     ALPRAZolam 0.5 MG tablet  Commonly known as:  XANAX  Take 0.5 mg by mouth daily as needed. For anxiety     aspirin 81 MG chewable tablet  Chew 81 mg by mouth daily.     chlorthalidone 25 MG tablet  Commonly known as:  HYGROTON  Take 25 mg by mouth daily.     hydrALAZINE 50 MG tablet  Commonly known as:  APRESOLINE  Take 50 mg by mouth 4 (four) times daily.     HYDROcodone-acetaminophen 5-500 MG per tablet  Commonly known as:  VICODIN  Take 1 tablet by mouth every 6 (six) hours as needed for pain.     insulin detemir 100 UNIT/ML injection  Commonly known as:  LEVEMIR  Inject 10 Units  into the skin at bedtime.     losartan 100 MG tablet  Commonly known as:  COZAAR  Take 50 mg by mouth daily.     metoCLOPramide 5 MG tablet  Commonly known as:  REGLAN  Take 5 mg by mouth 4 (four) times daily.     metoprolol 100 MG tablet  Commonly known as:  LOPRESSOR  Take 100 mg by mouth 2 (two) times daily.     omega-3 acid ethyl esters 1 G capsule  Commonly known as:  LOVAZA  Take 1 g by mouth 2 (two) times daily.           Follow-up Information   Follow up with Gwynneth Aliment, MD In 1 week.   Contact information:   [address] [place] [address] [phone]        The results of significant diagnostics from this hospitalization (including imaging, microbiology, ancillary and laboratory) are listed below for reference.    Significant Diagnostic Studies: Ct Head Wo Contrast  04/30/2012  *RADIOLOGY REPORT*  Clinical Data:   Trauma.  Laceration.  CT HEAD WITHOUT CONTRAST  Technique: Contiguous axial images were obtained from the base of the skull through the vertex without contrast  Comparison: Head CT of 03/07/2012 and.  No prior cervical spine imaging.  The CT angiography of the neck of 10/17/2011 is reviewed.  Findings:  Bone windows demonstrate probable staples about the left posterior scalp. No skull fracture.  Clear paranasal sinuses and mastoid air cells.  Soft tissue windows demonstrate advanced cerebral atrophy.  Similar mild low density in the periventricular white matter likely related to small vessel disease.  Remote right brain stem lacunar infarct on image 11.  Remote left basal ganglia lacunar infarcts.  No  mass lesion, hemorrhage, hydrocephalus, acute infarct, intra-axial, or extra-axial fluid collection.  IMPRESSION:  1.  No acute or post-traumatic deformity. 2.  Cerebral atrophy with chronic ischemic changes.  CT CERVICAL SPINE WITHOUT CONTRAST  Technique: Continous axial images were obtained of the cervical spine without contrast.   Sagittal and coronal reformats were constructed.  Findings:  Spinal visualization through bottom of T2. Prevertebral soft tissues are within normal limits.  Dense atherosclerosis in the left common carotid artery.  Probable right-sided laryngeal diverticulum.  Sub centimeter left sided thyroid hypoattenuating nodule is nonspecific.  A vascular stent the right-sided thoracic inlet, suboptimally evaluated.  Centrilobular emphysema at the apices, but no pneumothorax.  Spondylosis, resulting in left-sided neural foraminal narrowing at C5-C6 and C6-C7.  Skull base intact.  Maintenance of vertebral body height.  Advanced degenerative disc disease at C5-C6.  Degenerative changes also are seen at the C1-C2 reticulation. Facets are well-aligned.  .  IMPRESSION: 1.  No acute fracture or subluxation. 2.  Spondylosis, most marked at C5-C6, resulting in neural foraminal narrowing.   Original Report Authenticated By: Jeronimo Greaves, M.D.    Ct Cervical Spine Wo Contrast  04/30/2012  *RADIOLOGY REPORT*  Clinical Data:   Trauma.  Laceration.  CT HEAD WITHOUT CONTRAST  Technique: Contiguous axial images were obtained from the base of the skull through the vertex without contrast  Comparison: Head CT of 03/07/2012 and.  No prior cervical spine imaging.  The CT angiography of the neck of 10/17/2011 is reviewed.  Findings:  Bone windows demonstrate probable staples about the left posterior scalp. No skull fracture.  Clear paranasal sinuses and mastoid air cells.  Soft tissue windows demonstrate advanced cerebral atrophy.  Similar mild low density in the periventricular white matter likely related to small vessel disease.  Remote right brain stem lacunar infarct on image 11.  Remote left basal ganglia lacunar infarcts.  No  mass lesion, hemorrhage, hydrocephalus, acute infarct, intra-axial, or extra-axial fluid collection.  IMPRESSION:  1.  No acute or post-traumatic deformity. 2.  Cerebral atrophy with chronic ischemic changes.  CT  CERVICAL SPINE WITHOUT CONTRAST  Technique: Continous axial images were obtained of the cervical spine without contrast.  Sagittal and coronal reformats were constructed.  Findings:  Spinal visualization through bottom of T2. Prevertebral soft tissues are within normal limits.  Dense atherosclerosis in the left common carotid artery.  Probable right-sided laryngeal diverticulum.  Sub centimeter left sided thyroid hypoattenuating nodule is nonspecific.  A vascular stent the right-sided thoracic inlet, suboptimally evaluated.  Centrilobular emphysema at the apices, but no pneumothorax.  Spondylosis, resulting in left-sided neural foraminal narrowing at C5-C6 and C6-C7.  Skull base intact.  Maintenance of vertebral body height.  Advanced degenerative disc disease at C5-C6.  Degenerative changes also are seen at the C1-C2 reticulation. Facets are well-aligned.  Marland Kitchen  IMPRESSION: 1.  No acute fracture or subluxation. 2.  Spondylosis, most marked at C5-C6, resulting in neural foraminal narrowing.   Original Report Authenticated By: Jeronimo Greaves, M.D.    Ct Angio Abdomen W/cm &/or Wo Contrast  04/20/2012  **ADDENDUM** CREATED: 04/20/2012 12:03:42  The adrenal glands are normal in size with no nodule evident.  **END ADDENDUM** SIGNED BY: D. Oley Balm III, MD   04/18/2012  *RADIOLOGY REPORT*  Clinical Data:  Uncontrolled hypertension.  Adrenal adenoma. Previous appendectomy.  CT ANGIOGRAPHY ABDOMEN AND PELVIS  Technique:  Multidetector CT imaging of the abdomen and pelvis was performed using the standard protocol during bolus administration of intravenous contrast.  Multiplanar reconstructed images including MIPs were obtained and reviewed to evaluate the vascular anatomy.  Contrast: 80mL OMNIPAQUE IOHEXOL 350 MG/ML SOLN  Comparison:   None.  Arterial findings: Scattered coronary calcifications. Aorta:                  Extensive coarse calcified plaque in the visualized distal descending thoracic and abdominal aorta.  No  aneurysm, dissection, or stenosis.  Celiac axis:            Partially calcified ostial plaque resulting at least mild stenosis over a sub centimeter segment, patent distally with normal distal branching anatomy  Superior mesenteric:Heavily calcified ostial plaque extending over a 1 cm segment resulting in greater than 80% diameter stenosis over a short segment.  There is some eccentric calcified plaque more distally without tandem stenotic lesion.  Distal branching unremarkable.  Left renal:             Single, with heavily calcified ostial plaque.  There is a stent extending from the ostium over a length of approximately 18 mm, with evidence of some degree of in-stent neointimal hyperplasia and stenosis.  The distal left renal artery and branches are unremarkable.  Right renal:            Single. Partially calcified ostial plaque extending over a length of at least 1 cm, resulting in greater than 75% diameter stenosis over a short segment.  The distal right renal artery is widely patent.  Inferior mesenteric:Patent.  There is ostial stenosis related to the aortic wall plaque with post stenotic dilatation.  Left iliac:             Plaque at the origin of the common iliac artery, appears patent.  There is   calcified plaque through the length of the visualized common iliac artery.  Right iliac:            Heavily calcified plaque through the common iliac artery resulting in ostial and distal tandem areas of stenosis.  Venous findings:  Patent hepatic veins, portal vein, superior mesenteric vein, splenic vein.  There are prominent central mesenteric venous channels and paragastric venous channels but no definite   collateral venous drainage system is defined.  Bilateral renal veins are patent.   Review of the MIP images confirms the above findings.  Nonvascular findings: Visualized lung bases clear.  Transvenous pacing leads are partially seen. Unremarkable liver.  Single calcified granuloma in the spleen.  Bilateral  renal cysts.  No hydronephrosis.  Prominent duodenal diverticulum.  Stomach and visualized portions of small bowel and colon are nondilated.  No ascites.  No free air.  Paucity of intra-abdominal fat.  Fixation hardware in the lower lumbar spine, incompletely visualized.  L2 and L4 compression fracture deformities stable since lumbar scan 08/13/2009.  IMPRESSION:  1.  Right renal  artery ostial stenosis of probable hemodynamic significance. 2.  Left renal artery ostial stent with a degree of in-stent restenosis of possible hemodynamic significance. 3.  Ostial stenoses of the SMA and IMA.  Correlate with any clinical or laboratory evidence of mesenteric ischemia. 4.  Bilateral iliac arterial occlusive disease, incompletely visualized.  Correlate with clinical symptomatology and ABIs.  Original Report Authenticated By: D. Andria Rhein, MD     Microbiology: No results found for this or any previous visit (from the past 240 hour(s)).   Labs: Basic Metabolic Panel:  Recent Labs Lab 05/01/12 0020 05/01/12 0602 05/01/12 0947 05/01/12 2109 05/02/12 0645  NA 134* 130* 130* 131* 133*  K 2.5* 2.8* 2.9* 3.9 4.3  CL 97 96 95* 98 100  CO2 33* 29 30 28 27   GLUCOSE 127* 194* 190* 159* 146*  BUN 10 9 9 12 12   CREATININE 0.79 0.67 0.65 0.82 0.82  CALCIUM 8.3* 7.7* 8.1* 7.8* 7.9*  MG  --   --  1.6  --   --   PHOS  --   --  1.8*  --  2.5   Liver Function Tests:  Recent Labs Lab 05/01/12 0947  AST 65*  ALT 93*  ALKPHOS 164*  BILITOT 0.4  PROT 4.8*  ALBUMIN 2.3*   No results found for this basename: LIPASE, AMYLASE,  in the last 168 hours No results found for this basename: AMMONIA,  in the last 168 hours CBC:  Recent Labs Lab 04/30/12 1519 05/02/12 0645  WBC 3.2* 4.9  NEUTROABS 1.9  --   HGB 14.3 13.4  HCT 40.8 36.8  MCV 94.0 91.8  PLT 95* 96*   Cardiac Enzymes: No results found for this basename: CKTOTAL, CKMB, CKMBINDEX, TROPONINI,  in the last 168 hours BNP: BNP (last 3  results) No results found for this basename: PROBNP,  in the last 8760 hours CBG:  Recent Labs Lab 05/01/12 1935 05/02/12 0012 05/02/12 0410 05/02/12 0450 05/02/12 0736  GLUCAP 188* 94 70 123* 164*       Signed:  Woodward Klem A  [facility] 05/02/2012, 10:57 AM

## 2012-05-02 NOTE — Progress Notes (Signed)
1230 Discharge instructions reviewed with patient and granddaughter. Skin WNL except staples inatct to right side of head 4 staples intact.

## 2012-05-02 NOTE — Progress Notes (Signed)
Physical Therapy Treatment Patient Details Name: Carmen [name] MRN: [id number] DOB: [birth year] Today's Date: 05/02/2012 Time: 4098-1191 PT Time Calculation (min): 24 min  PT Assessment / Plan / Recommendation Comments on Treatment Session  Pt adm with hyperglycemia and hypokalemia.  Pt with much improved mobility today.  Just requiring supervision for amb for safety. No physical assist needed.  Pt reports she has 24 hour assist available at home.    Follow Up Recommendations  Home health PT;Supervision for mobility/OOB     Does the patient have the potential to tolerate intense rehabilitation     Barriers to Discharge        Equipment Recommendations  None recommended by PT    Recommendations for Other Services    Frequency Min 3X/week   Plan Discharge plan remains appropriate;Frequency remains appropriate    Precautions / Restrictions Precautions Precautions: Fall Restrictions Weight Bearing Restrictions: No   Pertinent Vitals/Pain N/A    Mobility  Bed Mobility Bed Mobility: Supine to Sit;Sitting - Scoot to Edge of Bed Supine to Sit: 6: Modified independent (Device/Increase time);HOB elevated Sitting - Scoot to Edge of Bed: 6: Modified independent (Device/Increase time) Details for Bed Mobility Assistance: Incr time but no assist needed. Transfers Sit to Stand: 5: Supervision;With upper extremity assist;From bed Stand to Sit: 5: Supervision;With upper extremity assist;With armrests;To chair/3-in-1 Ambulation/Gait Ambulation/Gait Assistance: 5: Supervision Ambulation Distance (Feet): 100 Feet Assistive device: Rolling walker Ambulation/Gait Assistance Details: verbal cues to stand more erect. Gait Pattern: Step-through pattern;Decreased step length - right Gait velocity: decr    Exercises     PT Diagnosis:    PT Problem List:   PT Treatment Interventions:     PT Goals Acute Rehab PT Goals PT Goal: Supine/Side to Sit - Progress: Progressing toward goal PT  Goal: Sit to Stand - Progress: Progressing toward goal Pt will Ambulate: 51 - 150 feet;with modified independence;with least restrictive assistive device PT Goal: Ambulate - Progress: Updated due to goal met  Visit Information  Assistance Needed: +1    Subjective Data  Subjective: Pt states she is eager to go home.   Cognition  Cognition Overall Cognitive Status: Appears within functional limits for tasks assessed/performed Arousal/Alertness: Awake/alert Orientation Level: Appears intact for tasks assessed Behavior During Session: [facility] for tasks performed    Balance  Static Sitting Balance Static Sitting - Balance Support: No upper extremity supported Static Sitting - Level of Assistance: 7: Independent Static Standing Balance Static Standing - Balance Support: Bilateral upper extremity supported Static Standing - Level of Assistance: 5: Stand by assistance  End of Session PT - End of Session Equipment Utilized During Treatment: Gait belt Activity Tolerance: Patient tolerated treatment well Patient left: in chair;with call bell/phone within reach Nurse Communication: Mobility status   GP     [facility] 05/02/2012, 10:51 AM  [facility] PT [phone]

## 2012-05-02 NOTE — Care Management Note (Signed)
    Page 1 of 2   05/02/2012     10:43:04 AM   CARE MANAGEMENT NOTE 05/02/2012  Patient:  Carmen [name], Carmen [name]   Account Number:  [id number]  Date Initiated:  05/02/2012  Documentation initiated by:  Letha Cape  Subjective/Objective Assessment:   dx diabetic hyperosmolar non ketoitic  admit- lives with spouse and son. Patient has rolling walker and 3 n 1 at home. patien thas 24 hr care.     Action/Plan:   pt eval- rec hhpt with 24 hr.   Anticipated DC Date:  05/02/2012   Anticipated DC Plan:  HOME W HOME HEALTH SERVICES      DC Planning Services  CM consult      [facility] Choice  HOME HEALTH   Choice offered to / List presented to:  [name]        HH arranged  HH-2 PT  HH-4 NURSE'S AIDE      HH agency  [facility]   Status of service:  Completed, signed off Medicare Important Message given?   (If response is "NO", the following Medicare IM given date fields will be blank) Date Medicare IM given:   Date Additional Medicare IM given:    Discharge Disposition:  HOME W HOME HEALTH SERVICES  Per UR Regulation:  Reviewed for med. necessity/level of care/duration of stay  If discussed at Long Length of Stay Meetings, dates discussed:    Comments:  05/02/12 10:38 Letha Cape RN, BSN [phone] patient lives with spouse and son, patient has 24 hr care at home, she has a rolling walker and 3 n 1 at home. Patient chose [facility] from agency list for HHPT, and aide. Referral made to [facility], Marie notified.  Soc will begin 24-48 hrs post discharge.

## 2012-05-26 ENCOUNTER — Encounter (HOSPITAL_COMMUNITY): Payer: Self-pay | Admitting: Pharmacy Technician

## 2012-05-29 ENCOUNTER — Ambulatory Visit (HOSPITAL_COMMUNITY)
Admission: RE | Admit: 2012-05-29 | Discharge: 2012-05-29 | Disposition: A | Discharge status: Home / Self Care | Payer: Medicare Other | Source: Ambulatory Visit | Attending: Cardiology | Admitting: Cardiology

## 2012-05-29 ENCOUNTER — Encounter (HOSPITAL_COMMUNITY)
Admission: RE | Disposition: A | Discharge status: Home / Self Care | Payer: Self-pay | Source: Ambulatory Visit | Attending: Cardiology

## 2012-05-29 DIAGNOSIS — Z8673 Personal history of transient ischemic attack (TIA), and cerebral infarction without residual deficits: Secondary | ICD-10-CM

## 2012-05-29 DIAGNOSIS — K3184 Gastroparesis: Secondary | ICD-10-CM

## 2012-05-29 DIAGNOSIS — R531 Weakness: Secondary | ICD-10-CM

## 2012-05-29 DIAGNOSIS — R112 Nausea with vomiting, unspecified: Secondary | ICD-10-CM

## 2012-05-29 DIAGNOSIS — E876 Hypokalemia: Secondary | ICD-10-CM

## 2012-05-29 DIAGNOSIS — I639 Cerebral infarction, unspecified: Secondary | ICD-10-CM

## 2012-05-29 DIAGNOSIS — K5731 Diverticulosis of large intestine without perforation or abscess with bleeding: Secondary | ICD-10-CM

## 2012-05-29 DIAGNOSIS — E162 Hypoglycemia, unspecified: Secondary | ICD-10-CM

## 2012-05-29 DIAGNOSIS — Z950 Presence of cardiac pacemaker: Secondary | ICD-10-CM

## 2012-05-29 DIAGNOSIS — E1100 Type 2 diabetes mellitus with hyperosmolarity without nonketotic hyperglycemic-hyperosmolar coma (NKHHC): Secondary | ICD-10-CM

## 2012-05-29 DIAGNOSIS — G629 Polyneuropathy, unspecified: Secondary | ICD-10-CM

## 2012-05-29 DIAGNOSIS — I150 Renovascular hypertension: Secondary | ICD-10-CM

## 2012-05-29 DIAGNOSIS — I441 Atrioventricular block, second degree: Secondary | ICD-10-CM

## 2012-05-29 DIAGNOSIS — G9340 Encephalopathy, unspecified: Secondary | ICD-10-CM

## 2012-05-29 DIAGNOSIS — L98499 Non-pressure chronic ulcer of skin of other sites with unspecified severity: Secondary | ICD-10-CM | POA: Insufficient documentation

## 2012-05-29 DIAGNOSIS — IMO0002 Reserved for concepts with insufficient information to code with codable children: Secondary | ICD-10-CM

## 2012-05-29 DIAGNOSIS — W19XXXA Unspecified fall, initial encounter: Secondary | ICD-10-CM

## 2012-05-29 DIAGNOSIS — K5900 Constipation, unspecified: Secondary | ICD-10-CM

## 2012-05-29 DIAGNOSIS — D62 Acute posthemorrhagic anemia: Secondary | ICD-10-CM

## 2012-05-29 DIAGNOSIS — D696 Thrombocytopenia, unspecified: Secondary | ICD-10-CM

## 2012-05-29 DIAGNOSIS — I739 Peripheral vascular disease, unspecified: Secondary | ICD-10-CM

## 2012-05-29 DIAGNOSIS — E1151 Type 2 diabetes mellitus with diabetic peripheral angiopathy without gangrene: Secondary | ICD-10-CM

## 2012-05-29 DIAGNOSIS — E43 Unspecified severe protein-calorie malnutrition: Secondary | ICD-10-CM

## 2012-05-29 DIAGNOSIS — L97509 Non-pressure chronic ulcer of other part of unspecified foot with unspecified severity: Secondary | ICD-10-CM | POA: Insufficient documentation

## 2012-05-29 DIAGNOSIS — I10 Essential (primary) hypertension: Secondary | ICD-10-CM

## 2012-05-29 DIAGNOSIS — Z720 Tobacco use: Secondary | ICD-10-CM

## 2012-05-29 DIAGNOSIS — N179 Acute kidney failure, unspecified: Secondary | ICD-10-CM

## 2012-05-29 DIAGNOSIS — K922 Gastrointestinal hemorrhage, unspecified: Secondary | ICD-10-CM

## 2012-05-29 HISTORY — PX: LOWER EXTREMITY ANGIOGRAM: SHX5508

## 2012-05-29 LAB — COMPREHENSIVE METABOLIC PANEL
ALT: 48 U/L — ABNORMAL HIGH (ref 0–35)
Albumin: 2.9 g/dL — ABNORMAL LOW (ref 3.5–5.2)
Alkaline Phosphatase: 165 U/L — ABNORMAL HIGH (ref 39–117)
Chloride: 94 mEq/L — ABNORMAL LOW (ref 96–112)
GFR calc Af Amer: 53 mL/min — ABNORMAL LOW (ref >90)
Glucose, Bld: 431 mg/dL — ABNORMAL HIGH (ref 70–99)
Potassium: 2.1 mEq/L — CL (ref 3.5–5.1)
Sodium: 136 mEq/L (ref 135–145)
Total Bilirubin: 0.4 mg/dL (ref 0.3–1.2)
Total Protein: 6.3 g/dL (ref 6.0–8.3)

## 2012-05-29 LAB — LIPID PANEL
HDL: 87 mg/dL (ref >39)
LDL Cholesterol: 50 mg/dL (ref 0–99)
Triglycerides: 59 mg/dL (ref <150)
VLDL: 12 mg/dL (ref 0–40)

## 2012-05-29 LAB — POCT I-STAT, CHEM 8
Glucose, Bld: 410 mg/dL — ABNORMAL HIGH (ref 70–99)
HCT: 42 % (ref 36.0–46.0)
Hemoglobin: 14.3 g/dL (ref 12.0–15.0)
Potassium: 2.1 mEq/L — CL (ref 3.5–5.1)
Sodium: 136 mEq/L (ref 135–145)
TCO2: 38 mmol/L (ref 0–100)

## 2012-05-29 LAB — CBC
HCT: 37.2 % (ref 36.0–46.0)
Hemoglobin: 13.4 g/dL (ref 12.0–15.0)
MCH: 33.7 pg (ref 26.0–34.0)
RBC: 3.98 MIL/uL (ref 3.87–5.11)

## 2012-05-29 LAB — POCT ACTIVATED CLOTTING TIME
Activated Clotting Time: 214 seconds
Activated Clotting Time: 203 seconds

## 2012-05-29 LAB — APTT: aPTT: 30 seconds (ref 24–37)

## 2012-05-29 SURGERY — ANGIOGRAM, LOWER EXTREMITY
Anesthesia: LOCAL | Laterality: Right

## 2012-05-29 MED ORDER — FAMOTIDINE IN NACL 20-0.9 MG/50ML-% IV SOLN: 20.0000 mg | Freq: Once | INTRAVENOUS | Status: DC

## 2012-05-29 MED ORDER — LIDOCAINE-EPINEPHRINE 1 %-1:100000 IJ SOLN
3.0000 mL | Freq: Once | INTRAMUSCULAR | Status: AC
  Administered 2012-05-29: 6 mL via INTRADERMAL

## 2012-05-29 MED ORDER — LIDOCAINE HCL (PF) 1 % IJ SOLN
INTRAMUSCULAR | Status: AC
  Filled 2012-05-29: qty 30

## 2012-05-29 MED ORDER — POTASSIUM CHLORIDE CRYS ER 20 MEQ PO TBCR: 20.0000 meq | EXTENDED_RELEASE_TABLET | Freq: Two times a day (BID) | ORAL | Status: DC

## 2012-05-29 MED ORDER — INSULIN ASPART 100 UNIT/ML ~~LOC~~ SOLN
SUBCUTANEOUS | Status: AC
Start: 2012-05-29 — End: 2012-05-29
  Filled 2012-05-29: qty 1

## 2012-05-29 MED ORDER — LABETALOL HCL 5 MG/ML IV SOLN
10.0000 mg | INTRAVENOUS | Status: DC | PRN
  Administered 2012-05-29: 10 mg via INTRAVENOUS
  Filled 2012-05-29: qty 4

## 2012-05-29 MED ORDER — POTASSIUM CHLORIDE CRYS ER 20 MEQ PO TBCR: 20.0000 meq | EXTENDED_RELEASE_TABLET | Freq: Once | ORAL | Status: DC

## 2012-05-29 MED ORDER — LABETALOL HCL 5 MG/ML IV SOLN
INTRAVENOUS | Status: AC
  Filled 2012-05-29: qty 4

## 2012-05-29 MED ORDER — LABETALOL HCL 5 MG/ML IV SOLN: 10.0000 mg | INTRAVENOUS | Status: DC | PRN

## 2012-05-29 MED ORDER — MIDAZOLAM HCL 2 MG/2ML IJ SOLN
INTRAMUSCULAR | Status: AC
  Filled 2012-05-29: qty 2

## 2012-05-29 MED ORDER — FAMOTIDINE IN NACL 20-0.9 MG/50ML-% IV SOLN
INTRAVENOUS | Status: AC
  Filled 2012-05-29: qty 50

## 2012-05-29 MED ORDER — SODIUM CHLORIDE 0.9 % IV SOLN: INTRAVENOUS | Status: DC

## 2012-05-29 MED ORDER — POTASSIUM CHLORIDE CRYS ER 20 MEQ PO TBCR
EXTENDED_RELEASE_TABLET | ORAL | Status: AC
  Filled 2012-05-29: qty 1

## 2012-05-29 MED ORDER — FENTANYL CITRATE 0.05 MG/ML IJ SOLN
INTRAMUSCULAR | Status: AC
  Filled 2012-05-29: qty 2

## 2012-05-29 MED ORDER — POTASSIUM CHLORIDE 10 MEQ/100ML IV SOLN
INTRAVENOUS | Status: AC
  Filled 2012-05-29: qty 100

## 2012-05-29 MED ORDER — HYDRALAZINE HCL 20 MG/ML IJ SOLN
INTRAMUSCULAR | Status: AC
  Filled 2012-05-29: qty 1

## 2012-05-29 MED ORDER — LIDOCAINE-EPINEPHRINE 1 %-1:100000 IJ SOLN
INTRAMUSCULAR | Status: AC
  Filled 2012-05-29: qty 1

## 2012-05-29 MED ORDER — HYDRALAZINE HCL 20 MG/ML IJ SOLN
10.0000 mg | INTRAMUSCULAR | Status: DC | PRN
  Administered 2012-05-29: 10 mg via INTRAVENOUS

## 2012-05-29 MED ORDER — OXYCODONE-ACETAMINOPHEN 5-325 MG PO TABS
1.0000 | ORAL_TABLET | ORAL | Status: DC | PRN
  Administered 2012-05-29: 1 via ORAL

## 2012-05-29 MED ORDER — MORPHINE SULFATE 2 MG/ML IJ SOLN: 2.0000 mg | INTRAMUSCULAR | Status: DC | PRN

## 2012-05-29 MED ORDER — ACETAMINOPHEN 325 MG PO TABS: 650.0000 mg | ORAL_TABLET | ORAL | Status: DC | PRN

## 2012-05-29 MED ORDER — LABETALOL HCL 5 MG/ML IV SOLN
INTRAVENOUS | Status: AC
  Administered 2012-05-29: 10 mg via INTRAVENOUS
  Filled 2012-05-29: qty 4

## 2012-05-29 MED ORDER — SODIUM CHLORIDE 0.9 % IV SOLN: 1.0000 mL/kg/h | INTRAVENOUS | Status: DC

## 2012-05-29 MED ORDER — INSULIN ASPART 100 UNIT/ML ~~LOC~~ SOLN
10.0000 [IU] | Freq: Once | SUBCUTANEOUS | Status: AC
  Administered 2012-05-29: 10 [IU] via SUBCUTANEOUS

## 2012-05-29 MED ORDER — POTASSIUM CHLORIDE CRYS ER 20 MEQ PO TBCR
20.0000 meq | EXTENDED_RELEASE_TABLET | Freq: Once | ORAL | Status: AC
  Administered 2012-05-29: 20 meq via ORAL

## 2012-05-29 MED ORDER — ONDANSETRON HCL 4 MG/2ML IJ SOLN: 4.0000 mg | Freq: Four times a day (QID) | INTRAMUSCULAR | Status: DC | PRN

## 2012-05-29 MED ORDER — OXYCODONE-ACETAMINOPHEN 5-325 MG PO TABS
ORAL_TABLET | ORAL | Status: AC
  Filled 2012-05-29: qty 1

## 2012-05-29 MED ORDER — HEPARIN SODIUM (PORCINE) 1000 UNIT/ML IJ SOLN
INTRAMUSCULAR | Status: AC
  Filled 2012-05-29: qty 1

## 2012-05-29 NOTE — H&P (Signed)
  Please see paper chart  

## 2012-05-29 NOTE — Progress Notes (Signed)
Notified Dr Jacinto Halim, CBG 438, BP 209/76, and labs being drawn now.  New order to do I stat for labs

## 2012-05-29 NOTE — CV Procedure (Signed)
Procedures performed:  1. Left Femoral Arterial access 2. Abdominal aortogram.  3. Crossover from left  to the right  femoral artery placement of catheter tip in the right  femoral artery.  4. Right femoral arteriogram with distal runoff 5. PTA and stenting of the proximal SFA with 6x60 Zilver self expanding stent and balloon PTA of the distal SFA with 6x40 mm Chocolate balloon.  Indication:  Patient is a very frail and ill 79 year old African American female with known peripheral arterial disease with remote PTA and balloon angioplasty of the right external iliac artery, known high-grade stenosis of the SFA by previous peripheral arteriogram performed remotely, who was recently evaluated in office with a new onset of what appeared to be an ischemic, embolic nonulcerated, lesion of a great toe in the plantar aspect of the foot. Ischemic ulceration was inspected and with the patient with known peripheral artery disease, she was brought to the peripheral angiography suite under relatively urgent basis to evaluate her peripheral anatomy.  Peripheral arthrogram: No evidence of abdominal aneurysm. 2 renal arteries one on either side and they're widely patent.   Aortoiliac bifurcation was widely patent.  Femoral arteriogram: Normal femoral arteries and mild disease in the distal SFA   No significant peripheral arterial disease. Three-vessel runoff is noted on both legs.  Interventional data: successful  PTA and stenting of the proximal SFA with 6x60 Zilver self expanding stent and balloon PTA of the distal SFA with 6x40 mm Chocolate balloon. The stenosis was reduced from 90% to 0% in the proximal segment and 95% to 0%.  Recommendation: Patient has difficult to control hypertension, however with 20 mg of labetalol, blood pressure was well controlled. Her medications have all been mixed up and I suspect noncompliance with. She has chronic hypokalemia, and I suspect this is due to diuretic use. Secondary  causes of hypertension including adrenal adenoma has been ruled out. I will discharge her home with outpatient followup.  TECHNIQUE OF THE  PROCEDURE: Under sterile precautions using a 5-French left femoral arterial access, a 5 Crossover catheter was advanced  into the desscending aorta and into the right common iliac artery and arteriogram was performed, then advanced a end hole 5 French catheter into the right common femoral artery, and peripheral arteriogram was performed. This was performed over a 0.035 inch Versacore wire. .  Technique of intervention: Using heparin for anticoagulation and a 6 French  Ansel sheath, sheath was placed into the right femoral artery. Then a 300 cm long Cook approach 18 g 0.014 inch wire was utilized to cross the stenosis with mild amount of difficulty and the tip of the wire was carefully positioned in the distal SFA. Then we proceeded with balloon angioplasty utilizing a 6.0 x 40 mm chocolate balloon, and balloon and plasty was performed at 5 atmospheric pressure x 2. The balloon was withdrawn out of the body and after carefully evaluating the lesion, the distal lesion had no dissection, however the proximal lesion had a dissection. I deepened this with the same balloon for 3 minutes at 5 atmospheric pressure and there was persistence of dissection. Then we decided to proceed with implantation of a 6.0 x 60 mm Zilver self-expanding stent under fluoroscopic guidance. The stent was post dilated with a 5.0 x 40 mm Stirling balloon at 4 atmospheric pressure just outside of the stent distally and 6 atmospheric pressure in the distal segment and 9 atmospheric pressure in the midsegment for 30 seconds each.Following this angiography was repeated and excellent  results were confirmed.  The balloon and the wires were withdrawn out of the body and the sheath was gently pulled into the left femoral artery. Patient tolerated the procedure well. There was no immediate complication. The  sheath was sutured in place patient be discharged home with outpatient followup once hemostasis was obtained and patient tablets without any complications. A total of 70 cc of contrast was utilized for diagnostic and interventional procedure.

## 2012-05-29 NOTE — Interval H&P Note (Signed)
History and Physical Interval Note:  05/29/2012 8:00 AM  Carmen [name]  has presented today for surgery, with the diagnosis of Claudication  The various methods of treatment have been discussed with the patient and family. After consideration of risks, benefits and other options for treatment, the patient has consented to  Procedure(s): LOWER EXTREMITY ANGIOGRAM (N/A) and possible angioplasty as a surgical intervention .  The patient's history has been reviewed, patient examined, no change in status, stable for surgery.  I have reviewed the patient's chart and labs.  Questions were answered to the patient's satisfaction.     Pamella Pert

## 2012-05-29 NOTE — Progress Notes (Signed)
Notified Dr Bernadene Person on istat is 2.1, blood sent to lab to confirm.  Dr Jacinto Halim states her k runs low, to give Kdur.  New orders received.

## 2012-08-12 ENCOUNTER — Inpatient Hospital Stay (HOSPITAL_COMMUNITY): Payer: Medicare Other

## 2012-08-12 ENCOUNTER — Emergency Department (HOSPITAL_COMMUNITY): Payer: Medicare Other

## 2012-08-12 ENCOUNTER — Encounter (HOSPITAL_COMMUNITY): Payer: Self-pay | Admitting: Emergency Medicine

## 2012-08-12 ENCOUNTER — Inpatient Hospital Stay (HOSPITAL_COMMUNITY)
Admission: EM | Admit: 2012-08-12 | Discharge: 2012-08-16 | DRG: 291 | Disposition: A | Discharge status: Home / Self Care | Payer: Medicare Other | Attending: Internal Medicine | Admitting: Internal Medicine

## 2012-08-12 DIAGNOSIS — J9600 Acute respiratory failure, unspecified whether with hypoxia or hypercapnia: Secondary | ICD-10-CM

## 2012-08-12 DIAGNOSIS — R531 Weakness: Secondary | ICD-10-CM

## 2012-08-12 DIAGNOSIS — I798 Other disorders of arteries, arterioles and capillaries in diseases classified elsewhere: Secondary | ICD-10-CM | POA: Diagnosis present

## 2012-08-12 DIAGNOSIS — IMO0002 Reserved for concepts with insufficient information to code with codable children: Secondary | ICD-10-CM

## 2012-08-12 DIAGNOSIS — I5033 Acute on chronic diastolic (congestive) heart failure: Principal | ICD-10-CM

## 2012-08-12 DIAGNOSIS — I150 Renovascular hypertension: Secondary | ICD-10-CM

## 2012-08-12 DIAGNOSIS — E119 Type 2 diabetes mellitus without complications: Secondary | ICD-10-CM | POA: Diagnosis present

## 2012-08-12 DIAGNOSIS — F411 Generalized anxiety disorder: Secondary | ICD-10-CM | POA: Diagnosis present

## 2012-08-12 DIAGNOSIS — E43 Unspecified severe protein-calorie malnutrition: Secondary | ICD-10-CM

## 2012-08-12 DIAGNOSIS — R609 Edema, unspecified: Secondary | ICD-10-CM

## 2012-08-12 DIAGNOSIS — K922 Gastrointestinal hemorrhage, unspecified: Secondary | ICD-10-CM

## 2012-08-12 DIAGNOSIS — Z8673 Personal history of transient ischemic attack (TIA), and cerebral infarction without residual deficits: Secondary | ICD-10-CM

## 2012-08-12 DIAGNOSIS — E1159 Type 2 diabetes mellitus with other circulatory complications: Secondary | ICD-10-CM | POA: Diagnosis present

## 2012-08-12 DIAGNOSIS — Z950 Presence of cardiac pacemaker: Secondary | ICD-10-CM

## 2012-08-12 DIAGNOSIS — K5731 Diverticulosis of large intestine without perforation or abscess with bleeding: Secondary | ICD-10-CM

## 2012-08-12 DIAGNOSIS — F17200 Nicotine dependence, unspecified, uncomplicated: Secondary | ICD-10-CM | POA: Diagnosis present

## 2012-08-12 DIAGNOSIS — G9340 Encephalopathy, unspecified: Secondary | ICD-10-CM

## 2012-08-12 DIAGNOSIS — K3184 Gastroparesis: Secondary | ICD-10-CM

## 2012-08-12 DIAGNOSIS — I739 Peripheral vascular disease, unspecified: Secondary | ICD-10-CM

## 2012-08-12 DIAGNOSIS — Z720 Tobacco use: Secondary | ICD-10-CM | POA: Diagnosis present

## 2012-08-12 DIAGNOSIS — E162 Hypoglycemia, unspecified: Secondary | ICD-10-CM

## 2012-08-12 DIAGNOSIS — I509 Heart failure, unspecified: Secondary | ICD-10-CM | POA: Diagnosis present

## 2012-08-12 DIAGNOSIS — I10 Essential (primary) hypertension: Secondary | ICD-10-CM

## 2012-08-12 DIAGNOSIS — I69959 Hemiplegia and hemiparesis following unspecified cerebrovascular disease affecting unspecified side: Secondary | ICD-10-CM

## 2012-08-12 DIAGNOSIS — E876 Hypokalemia: Secondary | ICD-10-CM

## 2012-08-12 DIAGNOSIS — R112 Nausea with vomiting, unspecified: Secondary | ICD-10-CM

## 2012-08-12 DIAGNOSIS — D696 Thrombocytopenia, unspecified: Secondary | ICD-10-CM

## 2012-08-12 DIAGNOSIS — I2510 Atherosclerotic heart disease of native coronary artery without angina pectoris: Secondary | ICD-10-CM | POA: Diagnosis present

## 2012-08-12 DIAGNOSIS — G629 Polyneuropathy, unspecified: Secondary | ICD-10-CM

## 2012-08-12 DIAGNOSIS — I639 Cerebral infarction, unspecified: Secondary | ICD-10-CM

## 2012-08-12 DIAGNOSIS — K5900 Constipation, unspecified: Secondary | ICD-10-CM

## 2012-08-12 DIAGNOSIS — Q619 Cystic kidney disease, unspecified: Secondary | ICD-10-CM

## 2012-08-12 DIAGNOSIS — E1151 Type 2 diabetes mellitus with diabetic peripheral angiopathy without gangrene: Secondary | ICD-10-CM

## 2012-08-12 DIAGNOSIS — E1100 Type 2 diabetes mellitus with hyperosmolarity without nonketotic hyperglycemic-hyperosmolar coma (NKHHC): Secondary | ICD-10-CM

## 2012-08-12 DIAGNOSIS — I441 Atrioventricular block, second degree: Secondary | ICD-10-CM

## 2012-08-12 DIAGNOSIS — N179 Acute kidney failure, unspecified: Secondary | ICD-10-CM

## 2012-08-12 DIAGNOSIS — D62 Acute posthemorrhagic anemia: Secondary | ICD-10-CM

## 2012-08-12 LAB — URINALYSIS, ROUTINE W REFLEX MICROSCOPIC
Bilirubin Urine: NEGATIVE
Glucose, UA: NEGATIVE mg/dL
Hgb urine dipstick: NEGATIVE
Ketones, ur: NEGATIVE mg/dL
Leukocytes, UA: NEGATIVE
Nitrite: NEGATIVE
Protein, ur: 100 mg/dL — AB
Specific Gravity, Urine: 1.011 (ref 1.005–1.030)
Urobilinogen, UA: 0.2 mg/dL (ref 0.0–1.0)
pH: 6 (ref 5.0–8.0)

## 2012-08-12 LAB — BASIC METABOLIC PANEL
BUN: 10 mg/dL (ref 6–23)
CO2: 28 mEq/L (ref 19–32)
Calcium: 7.9 mg/dL — ABNORMAL LOW (ref 8.4–10.5)
Chloride: 105 mEq/L (ref 96–112)
Creatinine, Ser: 0.92 mg/dL (ref 0.50–1.10)
GFR calc Af Amer: 68 mL/min — ABNORMAL LOW (ref >90)
GFR calc non Af Amer: 59 mL/min — ABNORMAL LOW (ref >90)
Glucose, Bld: 119 mg/dL — ABNORMAL HIGH (ref 70–99)
Potassium: 2.4 mEq/L — CL (ref 3.5–5.1)
Sodium: 142 mEq/L (ref 135–145)

## 2012-08-12 LAB — CBC
HCT: 37.3 % (ref 36.0–46.0)
Hemoglobin: 12.7 g/dL (ref 12.0–15.0)
MCH: 34.1 pg — ABNORMAL HIGH (ref 26.0–34.0)
MCHC: 34 g/dL (ref 30.0–36.0)
MCV: 100.3 fL — ABNORMAL HIGH (ref 78.0–100.0)
Platelets: 166 10*3/uL (ref 150–400)
RBC: 3.72 MIL/uL — ABNORMAL LOW (ref 3.87–5.11)
RDW: 14.1 % (ref 11.5–15.5)
WBC: 3.6 10*3/uL — ABNORMAL LOW (ref 4.0–10.5)

## 2012-08-12 LAB — GLUCOSE, CAPILLARY: Glucose-Capillary: 165 mg/dL — ABNORMAL HIGH (ref 70–99)

## 2012-08-12 LAB — PRO B NATRIURETIC PEPTIDE: Pro B Natriuretic peptide (BNP): 2540 pg/mL — ABNORMAL HIGH (ref 0–450)

## 2012-08-12 LAB — URINE MICROSCOPIC-ADD ON

## 2012-08-12 LAB — HEPATIC FUNCTION PANEL
AST: 80 U/L — ABNORMAL HIGH (ref 0–37)
Albumin: 2.6 g/dL — ABNORMAL LOW (ref 3.5–5.2)
Alkaline Phosphatase: 111 U/L (ref 39–117)
Total Bilirubin: 0.2 mg/dL — ABNORMAL LOW (ref 0.3–1.2)
Total Protein: 6 g/dL (ref 6.0–8.3)

## 2012-08-12 LAB — TROPONIN I: Troponin I: <0.3 ng/mL (ref <0.30)

## 2012-08-12 MED ORDER — FUROSEMIDE 10 MG/ML IJ SOLN
INTRAMUSCULAR | Status: AC
  Administered 2012-08-12: 20 mg via INTRAVENOUS
  Filled 2012-08-12: qty 4

## 2012-08-12 MED ORDER — OMEGA-3-ACID ETHYL ESTERS 1 G PO CAPS
1.0000 g | ORAL_CAPSULE | Freq: Three times a day (TID) | ORAL | Status: DC
  Administered 2012-08-12 – 2012-08-16 (×12): 1 g via ORAL
  Filled 2012-08-12 (×13): qty 1

## 2012-08-12 MED ORDER — SPIRONOLACTONE 50 MG PO TABS
50.0000 mg | ORAL_TABLET | Freq: Every day | ORAL | Status: DC
Start: 2012-08-13 — End: 2012-08-13
  Filled 2012-08-12: qty 1

## 2012-08-12 MED ORDER — INSULIN ASPART 100 UNIT/ML ~~LOC~~ SOLN
0.0000 [IU] | Freq: Three times a day (TID) | SUBCUTANEOUS | Status: DC
  Administered 2012-08-12: 1 [IU] via SUBCUTANEOUS
  Administered 2012-08-13: 3 [IU] via SUBCUTANEOUS
  Administered 2012-08-13: 9 [IU] via SUBCUTANEOUS

## 2012-08-12 MED ORDER — ATORVASTATIN CALCIUM 10 MG PO TABS
10.0000 mg | ORAL_TABLET | Freq: Every day | ORAL | Status: DC
  Administered 2012-08-13 – 2012-08-15 (×3): 10 mg via ORAL
  Filled 2012-08-12 (×4): qty 1

## 2012-08-12 MED ORDER — ENOXAPARIN SODIUM 40 MG/0.4ML ~~LOC~~ SOLN
40.0000 mg | SUBCUTANEOUS | Status: DC
  Administered 2012-08-12 – 2012-08-15 (×4): 40 mg via SUBCUTANEOUS
  Filled 2012-08-12 (×5): qty 0.4

## 2012-08-12 MED ORDER — LOSARTAN POTASSIUM 50 MG PO TABS
50.0000 mg | ORAL_TABLET | Freq: Every day | ORAL | Status: DC
  Administered 2012-08-12 – 2012-08-13 (×2): 50 mg via ORAL
  Filled 2012-08-12 (×2): qty 1

## 2012-08-12 MED ORDER — MAGNESIUM CHLORIDE 64 MG PO TBEC
1.0000 | DELAYED_RELEASE_TABLET | Freq: Two times a day (BID) | ORAL | Status: DC
  Administered 2012-08-12 – 2012-08-16 (×8): 64 mg via ORAL
  Filled 2012-08-12 (×9): qty 1

## 2012-08-12 MED ORDER — SODIUM CHLORIDE 0.9 % IJ SOLN
3.0000 mL | Freq: Two times a day (BID) | INTRAMUSCULAR | Status: DC
  Administered 2012-08-12 – 2012-08-16 (×8): 3 mL via INTRAVENOUS

## 2012-08-12 MED ORDER — FUROSEMIDE 10 MG/ML IJ SOLN: 20.0000 mg | Freq: Every day | INTRAMUSCULAR | Status: DC

## 2012-08-12 MED ORDER — METOCLOPRAMIDE HCL 5 MG PO TABS
5.0000 mg | ORAL_TABLET | Freq: Four times a day (QID) | ORAL | Status: DC
  Administered 2012-08-12 – 2012-08-16 (×16): 5 mg via ORAL
  Filled 2012-08-12 (×18): qty 1

## 2012-08-12 MED ORDER — HYDRALAZINE HCL 50 MG PO TABS
50.0000 mg | ORAL_TABLET | Freq: Four times a day (QID) | ORAL | Status: DC
  Administered 2012-08-12 (×2): 50 mg via ORAL
  Filled 2012-08-12 (×6): qty 1

## 2012-08-12 MED ORDER — ACETAMINOPHEN 325 MG PO TABS: 650.0000 mg | ORAL_TABLET | Freq: Four times a day (QID) | ORAL | Status: DC | PRN

## 2012-08-12 MED ORDER — IOHEXOL 300 MG/ML  SOLN
80.0000 mL | Freq: Once | INTRAMUSCULAR | Status: AC | PRN
  Administered 2012-08-12: 80 mL via INTRAVENOUS

## 2012-08-12 MED ORDER — ASPIRIN EC 81 MG PO TBEC
81.0000 mg | DELAYED_RELEASE_TABLET | Freq: Every day | ORAL | Status: DC
  Filled 2012-08-12: qty 1

## 2012-08-12 MED ORDER — ONDANSETRON HCL 4 MG PO TABS: 4.0000 mg | ORAL_TABLET | Freq: Four times a day (QID) | ORAL | Status: DC | PRN

## 2012-08-12 MED ORDER — ONDANSETRON HCL 4 MG/2ML IJ SOLN: 4.0000 mg | Freq: Four times a day (QID) | INTRAMUSCULAR | Status: DC | PRN

## 2012-08-12 MED ORDER — LOSARTAN POTASSIUM 50 MG PO TABS: 50.0000 mg | ORAL_TABLET | Freq: Every day | ORAL | Status: DC

## 2012-08-12 MED ORDER — ACETAMINOPHEN-CODEINE #3 300-30 MG PO TABS
0.5000 | ORAL_TABLET | Freq: Every day | ORAL | Status: DC | PRN
  Administered 2012-08-12 – 2012-08-16 (×4): 0.5 via ORAL
  Filled 2012-08-12 (×5): qty 1

## 2012-08-12 MED ORDER — GLUCERNA SHAKE PO LIQD
237.0000 mL | Freq: Three times a day (TID) | ORAL | Status: DC
  Administered 2012-08-12 – 2012-08-14 (×5): 237 mL via ORAL

## 2012-08-12 MED ORDER — FUROSEMIDE 10 MG/ML IJ SOLN
20.0000 mg | Freq: Every day | INTRAMUSCULAR | Status: DC
  Administered 2012-08-13: 20 mg via INTRAVENOUS
  Filled 2012-08-12 (×2): qty 2

## 2012-08-12 MED ORDER — METOPROLOL TARTRATE 100 MG PO TABS
100.0000 mg | ORAL_TABLET | Freq: Two times a day (BID) | ORAL | Status: DC
  Administered 2012-08-12 – 2012-08-15 (×6): 100 mg via ORAL
  Filled 2012-08-12 (×7): qty 1

## 2012-08-12 MED ORDER — DEXTROSE 50 % IV SOLN
25.0000 mL | Freq: Once | INTRAVENOUS | Status: AC
  Administered 2012-08-12: 25 mL via INTRAVENOUS
  Filled 2012-08-12: qty 50

## 2012-08-12 MED ORDER — LOSARTAN POTASSIUM 50 MG PO TABS
50.0000 mg | ORAL_TABLET | Freq: Every day | ORAL | Status: DC
  Filled 2012-08-12: qty 1

## 2012-08-12 MED ORDER — POTASSIUM CHLORIDE CRYS ER 20 MEQ PO TBCR
40.0000 meq | EXTENDED_RELEASE_TABLET | Freq: Three times a day (TID) | ORAL | Status: AC
  Administered 2012-08-12 – 2012-08-13 (×3): 40 meq via ORAL
  Filled 2012-08-12 (×4): qty 2

## 2012-08-12 MED ORDER — POTASSIUM CHLORIDE 10 MEQ/100ML IV SOLN
10.0000 meq | INTRAVENOUS | Status: AC
  Administered 2012-08-12 (×2): 10 meq via INTRAVENOUS
  Filled 2012-08-12 (×2): qty 100

## 2012-08-12 MED ORDER — ACETAMINOPHEN 650 MG RE SUPP: 650.0000 mg | Freq: Four times a day (QID) | RECTAL | Status: DC | PRN

## 2012-08-12 NOTE — ED Provider Notes (Signed)
History     CSN: [id number]  Arrival date & time 08/12/12  0944   First MD Initiated Contact with Patient 08/12/12 1032      Chief Complaint  Patient presents with  . Edema    (Consider location/radiation/quality/duration/timing/severity/associated sxs/prior treatment) HPI The patient presents to the emergency department with a one-week history of edema in her legs, abdomen, chest and arms.  Patient, states, that she was recently changed from Lasix to a new medication.  Due to the fact that she is having low, potassium.  The patient denies chest pain, shortness of breath, nausea, vomiting, abdominal pain, and diarrhea, back pain, fever, dysuria, weakness, or syncope.  Patient, states, that she did not take anything but her prescribed medications prior to arrival.  Patient, states nothing seems to make her condition, better or worse.  Her current symptoms have been constant Past Medical History  Diagnosis Date  . Diabetes mellitus   . Hypertension   . Coronary artery disease   . Renal disorder   . Herniated lumbar intervertebral disc   . Cataract     cataract surgery scheduled for 03/31/11, 2 - left eye, 1 - right eye  . Renovascular hypertension      s/p left renal artery stent 12/2007.   S/P balloon angioplasty on 02/16/10 for ISR, BP was  controlled well since then.     . Claudication in peripheral vascular disease     02/16/10: Left CIA 9.0x28 Omnilink and REIA 8.0x40 seff expanding Zilver.   right subclavian artery stent 03/18/2008,  . S/P carotid endarterectomy      left carotid endarterectomy  1991; Stroke and TIA in 1999 with right sided weakness, now with residual right arm weakness  . Stroke     TIA  . Peripheral vascular disease   . Anxiety     Past Surgical History  Procedure Laterality Date  . Appendectomy    . Abdominal hysterectomy    . Ureteral stent placement    . Carotid endarterectomy    . Laminectomy    . Back surgery    . Colonoscopy  07/30/2011   Procedure: COLONOSCOPY;  Surgeon: Louis Meckel, MD;  Location: [facility] ENDOSCOPY;  Service: Endoscopy;  Laterality: N/A;  gi bleed  . Esophagogastroduodenoscopy  07/30/2011    Procedure: ESOPHAGOGASTRODUODENOSCOPY (EGD);  Surgeon: Louis Meckel, MD;  Location: [facility] ENDOSCOPY;  Service: Endoscopy;  Laterality: N/A;  . Pacemaker insertion      Family History  Problem Relation Age of Onset  . Cancer Father     History  Substance Use Topics  . Smoking status: Current Every Day Smoker -- 0.50 packs/day for 65 years    Types: Cigarettes  . Smokeless tobacco: Never Used  . Alcohol Use: No    OB History   Grav Para Term Preterm Abortions TAB SAB Ect Mult Living                  Review of Systems  Allergies  Norvasc; Lasix; and Peanut-containing drug products  Home Medications   Current Outpatient Rx  Name  Route  Sig  Dispense  Refill  . acetaminophen-codeine (TYLENOL #3) 300-30 MG per tablet   Oral   Take 0.5 tablets by mouth daily as needed for pain.         Marland Kitchen ALPRAZolam (XANAX) 0.5 MG tablet   Oral   Take 0.5 mg by mouth daily as needed. For anxiety         . aspirin  EC 81 MG tablet   Oral   Take 81 mg by mouth daily.         . cilostazol (PLETAL) 100 MG tablet   Oral   Take 100 mg by mouth 2 (two) times daily.         . furosemide (LASIX) 40 MG tablet   Oral   Take 40 mg by mouth 2 (two) times daily.         . hydrALAZINE (APRESOLINE) 50 MG tablet   Oral   Take 50 mg by mouth 4 (four) times daily.          . insulin NPH-regular (NOVOLIN 70/30) (70-30) 100 UNIT/ML injection   Subcutaneous   Inject 20 Units into the skin 2 (two) times daily with a meal.         . labetalol (NORMODYNE) 200 MG tablet   Oral   Take 200 mg by mouth 2 (two) times daily.         Marland Kitchen losartan (COZAAR) 100 MG tablet   Oral   Take 50 mg by mouth daily.         . metoCLOPramide (REGLAN) 5 MG tablet   Oral   Take 5 mg by mouth 4 (four) times daily.         .  metoprolol (LOPRESSOR) 100 MG tablet   Oral   Take 100 mg by mouth 2 (two) times daily.         Marland Kitchen omega-3 acid ethyl esters (LOVAZA) 1 G capsule   Oral   Take 1 g by mouth 3 (three) times daily.          . potassium chloride SA (K-DUR,KLOR-CON) 20 MEQ tablet   Oral   Take 20 mEq by mouth 2 (two) times daily.         . rosuvastatin (CRESTOR) 10 MG tablet   Oral   Take 10 mg by mouth daily.         Marland Kitchen spironolactone (ALDACTONE) 50 MG tablet   Oral   Take 50 mg by mouth daily.           BP 188/72  Pulse 60  Temp(Src) 97.8 F (36.6 C) (Oral)  Resp 15  Ht 5\' 4"  (1.626 m)  Wt 146 lb (66.225 kg)  BMI 25.05 kg/m2  SpO2 95%  Physical Exam  Nursing note and vitals reviewed. Constitutional: She is oriented to person, place, and time. She appears well-developed and well-nourished. No distress.  HENT:  Head: Normocephalic.  Mouth/Throat: Oropharynx is clear and moist.  Eyes: Pupils are equal, round, and reactive to light.  Neck: Normal range of motion. Neck supple.  Cardiovascular: Normal rate, regular rhythm and normal heart sounds.  Exam reveals no gallop and no friction rub.   No murmur heard. Pulmonary/Chest: Effort normal and breath sounds normal. No respiratory distress. She has no wheezes. She has no rales.  Abdominal: Soft. Bowel sounds are normal. She exhibits no distension. There is no tenderness.  Musculoskeletal: She exhibits edema.  Neurological: She is alert and oriented to person, place, and time.  Skin: Skin is warm and dry.    ED Course  Procedures (including critical care time)  Labs Reviewed  BASIC METABOLIC PANEL - Abnormal; Notable for the following:    Potassium 2.4 (*)    Glucose, Bld 119 (*)    Calcium 7.9 (*)    GFR calc non Af Amer 59 (*)    GFR calc Af Amer 68 (*)  All other components within normal limits  CBC - Abnormal; Notable for the following:    WBC 3.6 (*)    RBC 3.72 (*)    MCV 100.3 (*)    MCH 34.1 (*)    All other  components within normal limits  URINALYSIS, ROUTINE W REFLEX MICROSCOPIC - Abnormal; Notable for the following:    Protein, ur 100 (*)    All other components within normal limits  PRO B NATRIURETIC PEPTIDE - Abnormal; Notable for the following:    Pro B Natriuretic peptide (BNP) 2540.0 (*)    All other components within normal limits  URINE MICROSCOPIC-ADD ON - Abnormal; Notable for the following:    Squamous Epithelial / LPF FEW (*)    All other components within normal limits  GLUCOSE, CAPILLARY - Abnormal; Notable for the following:    Glucose-Capillary 33 (*)    All other components within normal limits  TROPONIN I   Dg Chest 2 View  08/12/2012   *RADIOLOGY REPORT*  Clinical Data: Edema, swelling, pain and legs when walking. History of hypertension and diabetes.  CHEST - 2 VIEW  Comparison: 11/11/2011  Findings: The heart is mildly enlarged.  Patient has left-sided transvenous pacemaker with leads to the right atrium and right ventricle.  There are bilateral pleural effusions.  Bilateral lower lobe alveolar infiltrates are present, most consistent with infectious process. Stent identified in the upper mediastinum and upper abdomen.  IMPRESSION: 1.  Mild cardiomegaly. 2.  Bilateral lower lobe infiltrates and pleural effusions.   Original Report Authenticated By: Norva Pavlov, M.D.     1. Edema   2. Hypokalemia     I spoke with the, [facility], who will admit the patient for further evaluation and care.  Patient is advised to the plan of admission.  All questions were answered.  Patient has remained stable.  Other than a low blood sugar of 33, which reviewed gave D50 25 ML's and food.  Patient responded appropriately  MDM  MDM Reviewed: vitals, nursing note and previous chart Interpretation: labs, ECG and x-ray Consults: admitting MD   Date: 08/12/2012  Rate: 62  Rhythm: normal sinus rhythm  QRS Axis: normal  Intervals: normal  ST/T Wave abnormalities:  nonspecific T wave changes  Conduction Disutrbances:none  Narrative Interpretation:   Old EKG Reviewed: unchanged           Carlyle Dolly, PA-C 08/12/12 1407

## 2012-08-12 NOTE — H&P (Signed)
[facility] History and Physical  Carmen [name] JYN:[id number] DOB: Dec 11, 1933 DOA: 08/12/2012  Referring physician: ED PCP: Gwynneth Aliment, MD  Specialists: patient sees Dr. Jacinto Halim   Chief Complaint:  Chief Complaint  Patient presents with  . Edema     HPI: Carmen [name] is a 79 y.o. female with widespread atherosclerosis, lifelong tobacco abuse, episodes of recurrent severe hypokalemia and hypoglycemia who presents today to the emergency room with progressive edema of her lower extremities, upper extremities, face and increased abdominal girth. None of her medications have changed recently. She denies any fevers or chills. She reports eating very little. She continues to smoke cigarettes. She denies any pain.    Review of Systems: The patient denies  fever,  vision loss, decreased hearing, hoarseness, chest pain, syncope, dyspnea on exertion, balance deficits, hemoptysis, melena, hematochezia, severe indigestion/heartburn, hematuria, incontinence, genital sores, suspicious skin lesions, transient blindness, difficulty walking, depression, unusual weight change, abnormal bleeding, enlarged lymph nodes,   Past Medical History  Diagnosis Date  . Diabetes mellitus   . Hypertension   . Coronary artery disease   . Renal disorder   . Herniated lumbar intervertebral disc   . Cataract     cataract surgery scheduled for 03/31/11, 2 - left eye, 1 - right eye  . Renovascular hypertension      s/p left renal artery stent 12/2007.   S/P balloon angioplasty on 02/16/10 for ISR, BP was  controlled well since then.     . Claudication in peripheral vascular disease     02/16/10: Left CIA 9.0x28 Omnilink and REIA 8.0x40 seff expanding Zilver.   right subclavian artery stent 03/18/2008,  . S/P carotid endarterectomy      left carotid endarterectomy  1991; Stroke and TIA in 1999 with right sided weakness, now with residual right arm weakness  . Stroke     TIA  . Peripheral vascular disease    . Anxiety    Past Surgical History  Procedure Laterality Date  . Appendectomy    . Abdominal hysterectomy    . Ureteral stent placement    . Carotid endarterectomy    . Laminectomy    . Back surgery    . Colonoscopy  07/30/2011    Procedure: COLONOSCOPY;  Surgeon: Louis Meckel, MD;  Location: [facility] ENDOSCOPY;  Service: Endoscopy;  Laterality: N/A;  gi bleed  . Esophagogastroduodenoscopy  07/30/2011    Procedure: ESOPHAGOGASTRODUODENOSCOPY (EGD);  Surgeon: Louis Meckel, MD;  Location[facility] ENDOSCOPY;  Service: Endoscopy;  Laterality: N/A;  . Pacemaker insertion     Social History:  reports that she has been smoking Cigarettes.  She has a 32.5 pack-year smoking history. She has never used smokeless tobacco. She reports that she does not drink alcohol or use illicit drugs. She lives alone at home  Allergies  Allergen Reactions  . Norvasc (Amlodipine Besylate) Swelling  . Lasix (Furosemide)     Caused a problem with her kidneys  . Peanut-Containing Drug Products Other (See Comments)    Cause my stomach to hurt    Family History  Problem Relation Age of Onset  . Cancer Father     Prior to Admission medications   Medication Sig Start Date End Date Taking? Authorizing Provider  acetaminophen-codeine (TYLENOL #3) 300-30 MG per tablet Take 0.5 tablets by mouth daily as needed for pain.   Yes Historical Provider, MD  ALPRAZolam Prudy Feeler) 0.5 MG tablet Take 0.5 mg by mouth daily as needed. For anxiety   Yes  Historical Provider, MD  aspirin EC 81 MG tablet Take 81 mg by mouth daily.   Yes Historical Provider, MD  cilostazol (PLETAL) 100 MG tablet Take 100 mg by mouth 2 (two) times daily.   Yes Historical Provider, MD  furosemide (LASIX) 40 MG tablet Take 40 mg by mouth 2 (two) times daily.   Yes Historical Provider, MD  hydrALAZINE (APRESOLINE) 50 MG tablet Take 50 mg by mouth 4 (four) times daily.    Yes Historical Provider, MD  insulin NPH-regular (NOVOLIN 70/30) (70-30) 100 UNIT/ML  injection Inject 20 Units into the skin 2 (two) times daily with a meal.   Yes Historical Provider, MD  labetalol (NORMODYNE) 200 MG tablet Take 200 mg by mouth 2 (two) times daily.   Yes Historical Provider, MD  losartan (COZAAR) 100 MG tablet Take 50 mg by mouth daily.   Yes Historical Provider, MD  metoCLOPramide (REGLAN) 5 MG tablet Take 5 mg by mouth 4 (four) times daily.   Yes Historical Provider, MD  metoprolol (LOPRESSOR) 100 MG tablet Take 100 mg by mouth 2 (two) times daily.   Yes Historical Provider, MD  omega-3 acid ethyl esters (LOVAZA) 1 G capsule Take 1 g by mouth 3 (three) times daily.    Yes Historical Provider, MD  potassium chloride SA (K-DUR,KLOR-CON) 20 MEQ tablet Take 20 mEq by mouth 2 (two) times daily.   Yes Historical Provider, MD  rosuvastatin (CRESTOR) 10 MG tablet Take 10 mg by mouth daily.   Yes Historical Provider, MD  spironolactone (ALDACTONE) 50 MG tablet Take 50 mg by mouth daily.   Yes Historical Provider, MD   Physical Exam: Filed Vitals:   08/12/12 1245 08/12/12 1300 08/12/12 1330 08/12/12 1530  BP: 198/74 188/72 190/65   Pulse: 60 60 61   Temp:    98.9 F (37.2 C)  TempSrc:      Resp: 12 15 18    Height:      Weight:      SpO2: 95% 95% 95%    Patient Vitals for the past 24 hrs:  BP Temp Temp src Pulse Resp SpO2 Height Weight  08/12/12 1530 - 98.9 F (37.2 C) - - - - - -  08/12/12 1330 190/65 mmHg - - 61 18 95 % - -  08/12/12 1300 188/72 mmHg - - 60 15 95 % - -  08/12/12 1245 198/74 mmHg - - 60 12 95 % - -  08/12/12 1145 187/63 mmHg - - 64 18 95 % - -  08/12/12 1117 199/77 mmHg - - 68 16 94 % - -  08/12/12 1005 206/55 mmHg 97.8 F (36.6 C) Oral 64 14 96 % 5\' 4"  (1.626 m) 66.225 kg (146 lb)     General:  Alert and oriented x3  Eyes: Pupil equal round react to light accommodation  ENT: Clear pharynx, edentulous, prominent facial hair noted   Neck: No jugular venous distention  Cardiovascular: Regular rate and rhythm without murmurs rubs  or gallops  Respiratory: Crackles at bases  Abdomen: Soft, distended, no fluid wave, nontender  Skin: No rashes  Musculoskeletal: Widespread edema  Psychiatric: Euthymic  Neurologic: Cranial nerves 2-12 intact, strength 5 out of 5 in all extremities, sensation intact  Labs on Admission:  Basic Metabolic Panel:  Recent Labs Lab 08/12/12 1112  NA 142  K 2.4*  CL 105  CO2 28  GLUCOSE 119*  BUN 10  CREATININE 0.92  CALCIUM 7.9*   Liver Function Tests: No results found for  this basename: AST, ALT, ALKPHOS, BILITOT, PROT, ALBUMIN,  in the last 168 hours No results found for this basename: LIPASE, AMYLASE,  in the last 168 hours No results found for this basename: AMMONIA,  in the last 168 hours CBC:  Recent Labs Lab 08/12/12 1112  WBC 3.6*  HGB 12.7  HCT 37.3  MCV 100.3*  PLT 166   Cardiac Enzymes:  Recent Labs Lab 08/12/12 1112  TROPONINI <0.30    BNP (last 3 results)  Recent Labs  08/12/12 1112  PROBNP 2540.0*   CBG:  Recent Labs Lab 08/12/12 1317 08/12/12 1422  GLUCAP 33* 165*    Radiological Exams on Admission: Dg Chest 2 View  08/12/2012   *RADIOLOGY REPORT*  Clinical Data: Edema, swelling, pain and legs when walking. History of hypertension and diabetes.  CHEST - 2 VIEW  Comparison: 11/11/2011  Findings: The heart is mildly enlarged.  Patient has left-sided transvenous pacemaker with leads to the right atrium and right ventricle.  There are bilateral pleural effusions.  Bilateral lower lobe alveolar infiltrates are present, most consistent with infectious process. Stent identified in the upper mediastinum and upper abdomen.  IMPRESSION: 1.  Mild cardiomegaly. 2.  Bilateral lower lobe infiltrates and pleural effusions.   Original Report Authenticated By: Norva Pavlov, M.D.   Ct Chest W Contrast  08/12/2012   *RADIOLOGY REPORT*  Clinical Data: Cough and weight loss.  Evaluate for potential malignancy.  CT CHEST WITH CONTRAST  Technique:   Multidetector CT imaging of the chest was performed following the standard protocol during bolus administration of intravenous contrast.  Contrast: 80mL OMNIPAQUE IOHEXOL 300 MG/ML  SOLN  Comparison: No priors.  Findings:  Mediastinum: Heart size is mildly enlarged left ventricular concentric hypertrophy.  There is no significant pericardial fluid, thickening or pericardial calcification.  There is atherosclerosis of the thoracic aorta, the great vessels of the mediastinum and the coronary arteries, including calcified atherosclerotic plaque in the left main, left anterior descending, left circumflex and right coronary arteries. Calcifications of the aortic valve. No pathologically enlarged mediastinal or hilar lymph nodes. Esophagus is unremarkable in appearance.  Left-sided pacemaker in place with lead tips terminating in the right atrial appendage and right ventricular apex.  Lungs/Pleura: Mild diffuse interlobular septal thickening and thickening of the fissures, compatible with mild interstitial pulmonary edema.  Small bilateral pleural effusions.  Areas of passive atelectasis throughout the lower lobes of the lungs bilaterally.  Calcified granuloma in the left upper lobe.  No other definite suspicious-appearing pulmonary nodules or masses are identified.  Upper Abdomen: Extensive atherosclerosis.  Multiple low attenuation renal lesions bilaterally, similar to prior examinations, the largest of which are compatible with cysts, while the smaller lesions remain too small to definitively characterize.  Multifocal parenchymal calcification throughout the pancreas, compatible with sequelae of chronic pancreatitis.  Musculoskeletal: There are no aggressive appearing lytic or blastic lesions noted in the visualized portions of the skeleton.  IMPRESSION: 1.  No definite suspicious appearing pulmonary nodules or masses identified. 2.  Cardiomegaly with left ventricular concentric hypertrophy and evidence of interstitial  pulmonary edema with small bilateral pleural effusions; findings suggestive of congestive heart failure. 3. Atherosclerosis, including left main three-vessel coronary artery disease.  4.  Calcified granuloma in the left upper lobe. 5.  Additional incidental findings, as above, similar to prior examinations.   Original Report Authenticated By: Trudie Reed, M.D.      Assessment/Plan Active Problems:   Hypokalemia   HTN (hypertension), malignant   Tobacco abuse  DM (diabetes mellitus), type 2, uncontrolled, periph vascular complic   Claudication in peripheral vascular disease status post stent to common iliac artery and 2011 and right subclavian artery stent 2010   Retrovascular hypertension status post left renal artery stent 2009 and balloon angioplasty for in-stent restenosis 2011   Hypoglycemia   History of CVA (cerebrovascular accident) with associated mild right upper extremity hemiplegia   Cardiac pacemaker for history of intermittent heart block   1. Hypokalemia from furosemide. Clearly patient also has a component of potassium wasting. Given her other clinical features of facial hirsutism and hypertension I strongly suspect a mineralocorticoid excess secretion syndrome. She already had a normal aldosterone level. With long history of tobacco abuse there is a risk for a lung malignancy producing ACTH. We will replace potassium and check chest CT 2. Anasarca -suspect multifactorial. Patient may have proteinuria and we will check a urine protein/creatinine ratio. Patient also has abnormal LFTs and we will check an abdominal ultrasound to rule out ascites. Patient has low albumin - most likely from poor by mouth intake. Gentle diureses with IV Lasix 3. DM2 - SSI for now given severe hypoglycemia on admission.  4. Hx of CVA - continue aspirin  5. tobacco abuse-advised to quit 6. severe hypertension-continue beta blockers, hydralazine, angiotensin receptor blocker and  spironolactone    Code Status: Full code (must indicate code status--if unknown or must be presumed, indicate so) Family Communication: Patient and daughter (indicate person spoken with, if applicable, with phone number if by telephone) Disposition Plan: Home (indicate anticipated LOS)    Babita Amaker [facility] Pager [phone]  If 7PM-7AM, please contact night-coverage [email or web address] Password [facility] 08/12/2012, 4:32 PM

## 2012-08-12 NOTE — ED Notes (Signed)
Report to [place] RN, she will update Peggy after pt comes from CT and [place].

## 2012-08-12 NOTE — ED Notes (Signed)
Food and snacks given for low blood sugar. Pt taking po well, Md notified

## 2012-08-12 NOTE — ED Notes (Signed)
Critical Care result 2.4 reported to Md

## 2012-08-12 NOTE — ED Provider Notes (Signed)
Medical screening examination/treatment/procedure(s) were conducted as a shared visit with non-physician practitioner(s) and myself.  I personally evaluated the patient during the encounter   Loren Racer, MD 08/12/12 1428

## 2012-08-12 NOTE — ED Notes (Signed)
Pt present to ED with complaints of edema arms legs feet abdomin. Pt states she was started on a new medicine last week.

## 2012-08-12 NOTE — ED Notes (Signed)
Patient transported to CT 

## 2012-08-13 DIAGNOSIS — E1159 Type 2 diabetes mellitus with other circulatory complications: Secondary | ICD-10-CM

## 2012-08-13 DIAGNOSIS — E43 Unspecified severe protein-calorie malnutrition: Secondary | ICD-10-CM

## 2012-08-13 DIAGNOSIS — I798 Other disorders of arteries, arterioles and capillaries in diseases classified elsewhere: Secondary | ICD-10-CM

## 2012-08-13 DIAGNOSIS — Z950 Presence of cardiac pacemaker: Secondary | ICD-10-CM

## 2012-08-13 DIAGNOSIS — E876 Hypokalemia: Secondary | ICD-10-CM

## 2012-08-13 DIAGNOSIS — R609 Edema, unspecified: Secondary | ICD-10-CM

## 2012-08-13 DIAGNOSIS — I6350 Cerebral infarction due to unspecified occlusion or stenosis of unspecified cerebral artery: Secondary | ICD-10-CM

## 2012-08-13 DIAGNOSIS — I10 Essential (primary) hypertension: Secondary | ICD-10-CM

## 2012-08-13 DIAGNOSIS — E162 Hypoglycemia, unspecified: Secondary | ICD-10-CM

## 2012-08-13 LAB — CBC
HCT: 36.1 % (ref 36.0–46.0)
Hemoglobin: 12.4 g/dL (ref 12.0–15.0)
MCH: 34 pg (ref 26.0–34.0)
MCHC: 34.3 g/dL (ref 30.0–36.0)
MCV: 98.9 fL (ref 78.0–100.0)
RDW: 14 % (ref 11.5–15.5)

## 2012-08-13 LAB — BASIC METABOLIC PANEL
CO2: 28 mEq/L (ref 19–32)
Calcium: 7.7 mg/dL — ABNORMAL LOW (ref 8.4–10.5)
Creatinine, Ser: 0.97 mg/dL (ref 0.50–1.10)
GFR calc non Af Amer: 55 mL/min — ABNORMAL LOW (ref >90)
Sodium: 136 mEq/L (ref 135–145)

## 2012-08-13 LAB — GLUCOSE, CAPILLARY
Glucose-Capillary: 84 mg/dL (ref 70–99)
Glucose-Capillary: 224 mg/dL — ABNORMAL HIGH (ref 70–99)
Glucose-Capillary: 454 mg/dL — ABNORMAL HIGH (ref 70–99)

## 2012-08-13 LAB — COMPREHENSIVE METABOLIC PANEL
ALT: 71 U/L — ABNORMAL HIGH (ref 0–35)
AST: 79 U/L — ABNORMAL HIGH (ref 0–37)
Albumin: 2.4 g/dL — ABNORMAL LOW (ref 3.5–5.2)
Alkaline Phosphatase: 115 U/L (ref 39–117)
Calcium: 7.6 mg/dL — ABNORMAL LOW (ref 8.4–10.5)
GFR calc Af Amer: 70 mL/min — ABNORMAL LOW (ref >90)
Glucose, Bld: 81 mg/dL (ref 70–99)
Potassium: 2.9 mEq/L — ABNORMAL LOW (ref 3.5–5.1)
Sodium: 141 mEq/L (ref 135–145)
Total Protein: 5.8 g/dL — ABNORMAL LOW (ref 6.0–8.3)

## 2012-08-13 MED ORDER — CLONIDINE HCL 0.1 MG PO TABS
0.1000 mg | ORAL_TABLET | Freq: Three times a day (TID) | ORAL | Status: DC | PRN
  Administered 2012-08-13 – 2012-08-14 (×3): 0.1 mg via ORAL
  Filled 2012-08-13 (×3): qty 1

## 2012-08-13 MED ORDER — POTASSIUM CHLORIDE 10 MEQ/100ML IV SOLN
10.0000 meq | INTRAVENOUS | Status: AC
  Filled 2012-08-13 (×2): qty 100

## 2012-08-13 MED ORDER — INSULIN GLARGINE 100 UNIT/ML ~~LOC~~ SOLN
10.0000 [IU] | Freq: Every day | SUBCUTANEOUS | Status: DC
  Administered 2012-08-13: 10 [IU] via SUBCUTANEOUS
  Filled 2012-08-13: qty 0.1

## 2012-08-13 MED ORDER — ALPRAZOLAM 0.5 MG PO TABS
0.5000 mg | ORAL_TABLET | Freq: Once | ORAL | Status: AC
  Administered 2012-08-13: 0.5 mg via ORAL
  Filled 2012-08-13: qty 1

## 2012-08-13 MED ORDER — IPRATROPIUM BROMIDE 0.02 % IN SOLN
0.5000 mg | RESPIRATORY_TRACT | Status: DC
  Administered 2012-08-13 – 2012-08-15 (×13): 0.5 mg via RESPIRATORY_TRACT
  Filled 2012-08-13 (×14): qty 2.5

## 2012-08-13 MED ORDER — SPIRONOLACTONE 100 MG PO TABS
100.0000 mg | ORAL_TABLET | Freq: Every day | ORAL | Status: DC
  Administered 2012-08-13 – 2012-08-16 (×4): 100 mg via ORAL
  Filled 2012-08-13 (×5): qty 1

## 2012-08-13 MED ORDER — ALBUTEROL SULFATE (5 MG/ML) 0.5% IN NEBU
2.5000 mg | INHALATION_SOLUTION | RESPIRATORY_TRACT | Status: DC
  Administered 2012-08-13 – 2012-08-15 (×13): 2.5 mg via RESPIRATORY_TRACT
  Filled 2012-08-13 (×14): qty 0.5

## 2012-08-13 MED ORDER — HYDRALAZINE HCL 20 MG/ML IJ SOLN
10.0000 mg | INTRAMUSCULAR | Status: DC | PRN
  Administered 2012-08-13 – 2012-08-15 (×4): 10 mg via INTRAVENOUS
  Filled 2012-08-13 (×3): qty 1

## 2012-08-13 MED ORDER — ASPIRIN EC 325 MG PO TBEC
325.0000 mg | DELAYED_RELEASE_TABLET | Freq: Every day | ORAL | Status: DC
  Administered 2012-08-13 – 2012-08-16 (×4): 325 mg via ORAL
  Filled 2012-08-13 (×4): qty 1

## 2012-08-13 MED ORDER — MAGNESIUM SULFATE 40 MG/ML IJ SOLN
2.0000 g | Freq: Once | INTRAMUSCULAR | Status: AC
  Administered 2012-08-13: 2 g via INTRAVENOUS
  Filled 2012-08-13: qty 50

## 2012-08-13 MED ORDER — BUDESONIDE 0.5 MG/2ML IN SUSP
0.5000 mg | Freq: Two times a day (BID) | RESPIRATORY_TRACT | Status: DC
  Administered 2012-08-13 – 2012-08-16 (×7): 0.5 mg via RESPIRATORY_TRACT
  Filled 2012-08-13 (×10): qty 2

## 2012-08-13 MED ORDER — ISOSORBIDE MONONITRATE 15 MG HALF TABLET
15.0000 mg | ORAL_TABLET | Freq: Every day | ORAL | Status: DC
  Administered 2012-08-13: 15 mg via ORAL
  Filled 2012-08-13 (×2): qty 1

## 2012-08-13 MED ORDER — HYDRALAZINE HCL 50 MG PO TABS
100.0000 mg | ORAL_TABLET | Freq: Three times a day (TID) | ORAL | Status: DC
  Administered 2012-08-13 – 2012-08-16 (×11): 100 mg via ORAL
  Filled 2012-08-13 (×12): qty 2

## 2012-08-13 MED ORDER — LABETALOL HCL 5 MG/ML IV SOLN
5.0000 mg | Freq: Once | INTRAVENOUS | Status: AC
  Administered 2012-08-13: 5 mg via INTRAVENOUS
  Filled 2012-08-13: qty 4

## 2012-08-13 MED ORDER — INSULIN GLARGINE 100 UNIT/ML ~~LOC~~ SOLN: 20.0000 [IU] | Freq: Every day | SUBCUTANEOUS | Status: DC

## 2012-08-13 NOTE — Progress Notes (Addendum)
[facility] PROGRESS NOTE  Carmen [name] VHQ:[id number] DOB: 1933-03-18 DOA: 08/12/2012 PCP: Gwynneth Aliment, MD  Assessment/Plan  Acute hypoxic respiratory failure likely due to hypervolemia from it acute diastolic versus systolic heart failure in the setting of underlying CAD and extensive atherosclerosis.  Her BNP is elevated and she is cardiomegaly and interstitial edema on her chest x-ray.  Echo from 2013 demonstrated moderate concentric hypertrophy with normal systolic function, EF of 60-65% and no comment regarding diastolic dysfunction.  -2.8 L overnight -  Echo -  Continue diuresis with IV Lasix 20 mg once daily until potassium is more normal -  Patient is on ACE inhibitor, beta blocker, hydralazine but not imdur, statin and aspirin -  Wean oxygen as tolerated -  Goal of negative approximately 2 L  Wheezing, possibly due to acute heart failure exacerbation, awaiting echo to determine whether systolic versus diastolic, versus vocal cord dysfunction. The wheezing sounds becoming primarily from her neck area.  Differential also includes acute COPD exacerbation.   -  Continue diuresis for today -  If wheezing worsens despite ongoing diuresis or she becomes more Shanta Dorvil of breath we'll consider ENT consultation -  Start DuoNebs and budesonide for now -  If wheezing worsens start antibiotics and oral prednisone  Hypokalemia, I believe the patient has some sort of underlying metabolic or genetic disorder that causes profound recurrent hypokalemia. The patient states she has not taken Lasix in the last month and has been started on Cipro lactone in the meantime however she continued to have severe hypokalemia. -  ACTH level pending -  CT chest negative for malignancy -  Collect a 24-hour urine for potassium excretion -  Aldosterone and cortisol levels have previously been normal. -  I'll give 2 g of IV magnesium and continue her oral and IV potassium supplementation  Anasarca, likely  multifactorial from mildly low albumin at 2.4 and possible underlying heart failure -  No ascites on abdominal ultrasound -  Continue gentle diuresis -  Await urine protein to creatinine ratio however her urine protein on UA with 100 which is probably not in the significant nephrotic range. -  Followup echo -  Encourage oral intake and maximize nutritional status.  Severe uncontrolled hypertension, blood pressures at baseline 200s over 70s to 90s -  Increase the hydralazine 100 mg 3 times a day  -  Increase spironolactone to 100 mg daily -  Continue selective beta blocker metoprolol 100 mg twice a day in the setting of no known COPD -  Continue losartan at 50 mg daily for now -  Add low-dose Imdur -  Add hydralazine IV when necessary   Diabetes mellitus with hypoglycemia at admission, FS stable the 100 range.  Patient hyperglycemic pre dinner. -  Continue low-dose sliding scale insulin -  Last hemoglobin A1c was 14.4 in March of 2014 -  Patient definitely needs followup with endocrinology -  lantus 10 units for now (normally receives 28 units long acting and 12 units Joycelyn Liska acting total daily insulin)  History of CVA, increased to full dose aspirin for secondary prevention Tobacco abuse, advised to quit smoking Transaminitis, appears to be chronic and stable, no documentation of cirrhosis on ultrasound of the liver Right renal cyst, outpatient followup by primary care doctor   Diet:  Carbohydrate controlled  Access:  PIV  IVF:  Off  Proph:  Lovenox  Code Status: Full code  Family Communication: Spoke with the patient and her granddaughter who is at bedside  Disposition Plan: Pending improvement in dyspnea   Consultants:  None  Procedures:  Abdominal ultrasound  CT chest with contrast  Chest x-ray  Antibiotics:  None   HPI/Subjective:  The patient states that she continues to have shortness of breath and has been wheezing overnight. She has nonproductive cough.  She denies chest pain. She has been voiding frequently and denies problems with diarrhea or constipation.   Objective: Filed Vitals:   08/12/12 1831 08/12/12 2047 08/13/12 0116 08/13/12 0500  BP: 226/77 208/77 197/67 210/68  Pulse:  64 73 69  Temp:  97.5 F (36.4 C) 98.7 F (37.1 C) 98.3 F (36.8 C)  TempSrc:  Oral Oral Oral  Resp:  19 18 20   Height:      Weight:    64.093 kg (141 lb 4.8 oz)  SpO2:  94% 98% 93%    Intake/Output Summary (Last 24 hours) at 08/13/12 1034 Last data filed at 08/13/12 0827  Gross per 24 hour  Intake    997 ml  Output   3500 ml  Net  -2503 ml   Filed Weights   08/12/12 1005 08/12/12 1700 08/13/12 0500  Weight: 66.225 kg (146 lb) 66.316 kg (146 lb 3.2 oz) 64.093 kg (141 lb 4.8 oz)    Exam:   General:  African American female,  No acute distress  HEENT:  NCAT, MMM, nasal cannula in place  Cardiovascular:  RRR, nl S1, S2, no mrg, 2+ pulses, warm extremities  Respiratory:  Wall at the bilateral bases to the mid back, expiratory wheezes which sounds to be coming primarily from the neck area, no rhonchi, no increased WOB  Abdomen:   NABS, soft, NT/ND  MSK:   Normal tone and bulk, 2+  woody pitting edema LEE  Neuro:  Grossly intact  Data Reviewed: Basic Metabolic Panel:  Recent Labs Lab 08/12/12 1112 08/12/12 1521 08/13/12 0500  NA 142  --  141  K 2.4*  --  2.9*  CL 105  --  105  CO2 28  --  28  GLUCOSE 119*  --  81  BUN 10  --  10  CREATININE 0.92  --  0.90  CALCIUM 7.9*  --  7.6*  MG  --  1.5  --    Liver Function Tests:  Recent Labs Lab 08/12/12 1521 08/13/12 0500  AST 80* 79*  ALT 73* 71*  ALKPHOS 111 115  BILITOT 0.2* 0.2*  PROT 6.0 5.8*  ALBUMIN 2.6* 2.4*   No results found for this basename: LIPASE, AMYLASE,  in the last 168 hours No results found for this basename: AMMONIA,  in the last 168 hours CBC:  Recent Labs Lab 08/12/12 1112 08/13/12 0500  WBC 3.6* 4.2  HGB 12.7 12.4  HCT 37.3 36.1  MCV 100.3*  98.9  PLT 166 181   Cardiac Enzymes:  Recent Labs Lab 08/12/12 1112  TROPONINI <0.30   BNP (last 3 results)  Recent Labs  08/12/12 1112  PROBNP 2540.0*   CBG:  Recent Labs Lab 08/12/12 1317 08/12/12 1422 08/12/12 1807 08/12/12 2107  GLUCAP 33* 165* 133* 103*    No results found for this or any previous visit (from the past 240 hour(s)).   Studies: Dg Chest 2 View  08/12/2012   *RADIOLOGY REPORT*  Clinical Data: Edema, swelling, pain and legs when walking. History of hypertension and diabetes.  CHEST - 2 VIEW  Comparison: 11/11/2011  Findings: The heart is mildly enlarged.  Patient has left-sided transvenous pacemaker  with leads to the right atrium and right ventricle.  There are bilateral pleural effusions.  Bilateral lower lobe alveolar infiltrates are present, most consistent with infectious process. Stent identified in the upper mediastinum and upper abdomen.  IMPRESSION: 1.  Mild cardiomegaly. 2.  Bilateral lower lobe infiltrates and pleural effusions.   Original Report Authenticated By: Norva Pavlov, M.D.   Ct Chest W Contrast  08/12/2012   *RADIOLOGY REPORT*  Clinical Data: Cough and weight loss.  Evaluate for potential malignancy.  CT CHEST WITH CONTRAST  Technique:  Multidetector CT imaging of the chest was performed following the standard protocol during bolus administration of intravenous contrast.  Contrast: 80mL OMNIPAQUE IOHEXOL 300 MG/ML  SOLN  Comparison: No priors.  Findings:  Mediastinum: Heart size is mildly enlarged left ventricular concentric hypertrophy.  There is no significant pericardial fluid, thickening or pericardial calcification.  There is atherosclerosis of the thoracic aorta, the great vessels of the mediastinum and the coronary arteries, including calcified atherosclerotic plaque in the left main, left anterior descending, left circumflex and right coronary arteries. Calcifications of the aortic valve. No pathologically enlarged mediastinal or hilar  lymph nodes. Esophagus is unremarkable in appearance.  Left-sided pacemaker in place with lead tips terminating in the right atrial appendage and right ventricular apex.  Lungs/Pleura: Mild diffuse interlobular septal thickening and thickening of the fissures, compatible with mild interstitial pulmonary edema.  Small bilateral pleural effusions.  Areas of passive atelectasis throughout the lower lobes of the lungs bilaterally.  Calcified granuloma in the left upper lobe.  No other definite suspicious-appearing pulmonary nodules or masses are identified.  Upper Abdomen: Extensive atherosclerosis.  Multiple low attenuation renal lesions bilaterally, similar to prior examinations, the largest of which are compatible with cysts, while the smaller lesions remain too small to definitively characterize.  Multifocal parenchymal calcification throughout the pancreas, compatible with sequelae of chronic pancreatitis.  Musculoskeletal: There are no aggressive appearing lytic or blastic lesions noted in the visualized portions of the skeleton.  IMPRESSION: 1.  No definite suspicious appearing pulmonary nodules or masses identified. 2.  Cardiomegaly with left ventricular concentric hypertrophy and evidence of interstitial pulmonary edema with small bilateral pleural effusions; findings suggestive of congestive heart failure. 3. Atherosclerosis, including left main three-vessel coronary artery disease.  4.  Calcified granuloma in the left upper lobe. 5.  Additional incidental findings, as above, similar to prior examinations.   Original Report Authenticated By: Trudie Reed, M.D.   US Abdomen Complete  08/12/2012   *RADIOLOGY REPORT*  Clinical Data:  Abnormal LFTs.  Question of ascites.  COMPLETE ABDOMINAL ULTRASOUND  Comparison:  04/18/2012  Findings:  Gallbladder:  Gallbladder has a normal appearance.  Gallbladder wall is 2.7 mm, within normal limits.  No stones or pericholecystic fluid.  No sonographic Murphy's sign.   Common bile duct:  6.5 mm  Liver:  A focal hyper echoic lesion is identified within the liver, 1.2 x 1.1 x 1.0 cm.  IVC:  Appears normal.  Pancreas:  The pancreas is not well seen because of overlying bowel gas.  Spleen:  3.7 cm in length, normal in appearance.  Right Kidney:  10.6 cm in length.  Echogenic parenchyma.  Cyst is 2.1 x 1.8 x 1.6 cm.  No hydronephrosis.  Left Kidney:  10.3 cm.  Not well seen.  No focal mass or hydronephrosis identified however.  Abdominal aorta:  Atherosclerotic, 2.0 cm maximum diameter.  IMPRESSION:  1.  No evidence for acute cholecystitis. 2.  Focal hyperechoic lesion within the  liver, not seen on prior contrast enhanced CT exams.  It is possible that lesion was not seen on previous studies because of its small size.  Findings favor a benign hemangioma. 3.  Right renal cyst. 4.  No hydronephrosis.   Original Report Authenticated By: Norva Pavlov, M.D.    Scheduled Meds: . aspirin EC  81 mg Oral Daily  . atorvastatin  10 mg Oral q1800  . enoxaparin (LOVENOX) injection  40 mg Subcutaneous Q24H  . feeding supplement  237 mL Oral TID BM  . furosemide  20 mg Intravenous Daily  . hydrALAZINE  100 mg Oral TID  . insulin aspart  0-9 Units Subcutaneous TID WC  . losartan  50 mg Oral Daily  . magnesium chloride  1 tablet Oral BID  . magnesium sulfate 1 - 4 g bolus IVPB  2 g Intravenous Once  . metoCLOPramide  5 mg Oral QID  . metoprolol  100 mg Oral BID  . omega-3 acid ethyl esters  1 g Oral TID  . potassium chloride  40 mEq Oral TID  . sodium chloride  3 mL Intravenous Q12H  . spironolactone  100 mg Oral Daily   Continuous Infusions:   Active Problems:   HTN (hypertension), malignant   Hypokalemia   Tobacco abuse   DM (diabetes mellitus), type 2, uncontrolled, periph vascular complic   Claudication in peripheral vascular disease status post stent to common iliac artery and 2011 and right subclavian artery stent 2010   Retrovascular hypertension status post left  renal artery stent 2009 and balloon angioplasty for in-stent restenosis 2011   Hypoglycemia   History of CVA (cerebrovascular accident) with associated mild right upper extremity hemiplegia   Cardiac pacemaker for history of intermittent heart block    Time spent: 30 min    Casi Westerfeld, [facility]  [facility] Pager [phone]. If 7PM-7AM, please contact night-coverage at [email or web address], password [facility] 08/13/2012, 10:34 AM  LOS: 1 day

## 2012-08-13 NOTE — Progress Notes (Signed)
NP Claiborne Billings notified of BP recheck of 195/83 after IV hydralazine.  Rn will continue to monitor. Louretta Parma, RN

## 2012-08-13 NOTE — Progress Notes (Signed)
MD notified of patients BP 203/73.  PRN hydralazine given x1.  Patient asymptomatic and resting comfortably at bedside. RN will continue to monitor. Louretta Parma, RN and

## 2012-08-13 NOTE — Progress Notes (Signed)
Patient still with elevated BP. Rapid Response nurse called.  Labetalol given x1.  Current BP is 176/74. RN will continue to monitor. Louretta Parma, RN

## 2012-08-13 NOTE — Progress Notes (Signed)
Patient BP 216/82. NP Claiborne Billings notified. PO clonidine ordered PRN. Given x 1.  Recheck BP is 204/95.  Patient asymptomatic and laying comfortably in bed. RN will continue to monitor. Louretta Parma, RN

## 2012-08-13 NOTE — Progress Notes (Signed)
  Echocardiogram 2D Echocardiogram has been performed.  [place]ian Co 08/13/2012, 4:55 PM

## 2012-08-13 NOTE — Progress Notes (Signed)
Upon assessment, pt c/o feeling SOB since middle of night. Sat on room air 92%. 2l nasal cannula applied. Breath sounds diminished but clear. Denies chest pain. Will monitor

## 2012-08-14 ENCOUNTER — Encounter (HOSPITAL_COMMUNITY): Payer: Self-pay | Admitting: Internal Medicine

## 2012-08-14 LAB — GLUCOSE, CAPILLARY
Glucose-Capillary: 66 mg/dL — ABNORMAL LOW (ref 70–99)
Glucose-Capillary: 127 mg/dL — ABNORMAL HIGH (ref 70–99)
Glucose-Capillary: 198 mg/dL — ABNORMAL HIGH (ref 70–99)

## 2012-08-14 LAB — BASIC METABOLIC PANEL
CO2: 29 mEq/L (ref 19–32)
Calcium: 8.4 mg/dL (ref 8.4–10.5)
Creatinine, Ser: 0.91 mg/dL (ref 0.50–1.10)
GFR calc non Af Amer: 60 mL/min — ABNORMAL LOW (ref >90)

## 2012-08-14 LAB — NA AND K (SODIUM & POTASSIUM), 24 H UR
Potassium Urine: 18 mEq/L
Potassium, 24H Ur: 43 mEq/d (ref 25–125)
Sodium, 24H Ur: 218 mEq/d (ref 40–220)
Sodium, Ur: 91 mEq/L
Urine Total Volume-UNAK24: 2400 mL

## 2012-08-14 LAB — CBC
MCV: 98.6 fL (ref 78.0–100.0)
Platelets: 187 10*3/uL (ref 150–400)
RDW: 13.8 % (ref 11.5–15.5)
WBC: 4.4 10*3/uL (ref 4.0–10.5)

## 2012-08-14 LAB — PROTEIN / CREATININE RATIO, URINE
Protein Creatinine Ratio: 2.99 — ABNORMAL HIGH (ref 0.00–0.15)
Total Protein, Urine: 72 mg/dL

## 2012-08-14 LAB — NA AND K (SODIUM & POTASSIUM), RAND UR
Potassium Urine: 20 mEq/L
Sodium, Ur: 117 mEq/L

## 2012-08-14 MED ORDER — LOSARTAN POTASSIUM 50 MG PO TABS
100.0000 mg | ORAL_TABLET | Freq: Every day | ORAL | Status: DC
  Administered 2012-08-14 – 2012-08-16 (×3): 100 mg via ORAL
  Filled 2012-08-14 (×3): qty 2

## 2012-08-14 MED ORDER — ISOSORBIDE MONONITRATE ER 30 MG PO TB24
30.0000 mg | ORAL_TABLET | Freq: Every day | ORAL | Status: DC
  Administered 2012-08-14 – 2012-08-16 (×3): 30 mg via ORAL
  Filled 2012-08-14 (×3): qty 1

## 2012-08-14 MED ORDER — ALPRAZOLAM 0.5 MG PO TABS
0.5000 mg | ORAL_TABLET | Freq: Once | ORAL | Status: AC
  Administered 2012-08-14: 0.5 mg via ORAL
  Filled 2012-08-14: qty 1

## 2012-08-14 MED ORDER — POTASSIUM CHLORIDE CRYS ER 20 MEQ PO TBCR
40.0000 meq | EXTENDED_RELEASE_TABLET | Freq: Two times a day (BID) | ORAL | Status: AC
  Administered 2012-08-14 – 2012-08-15 (×3): 40 meq via ORAL
  Filled 2012-08-14 (×3): qty 2

## 2012-08-14 MED ORDER — ENSURE PUDDING PO PUDG
1.0000 | Freq: Two times a day (BID) | ORAL | Status: DC
  Administered 2012-08-14 – 2012-08-15 (×3): 1 via ORAL

## 2012-08-14 MED ORDER — INSULIN ASPART 100 UNIT/ML ~~LOC~~ SOLN
0.0000 [IU] | Freq: Three times a day (TID) | SUBCUTANEOUS | Status: DC
  Administered 2012-08-14: 2 [IU] via SUBCUTANEOUS
  Administered 2012-08-14: 3 [IU] via SUBCUTANEOUS
  Administered 2012-08-15 (×2): 5 [IU] via SUBCUTANEOUS
  Administered 2012-08-15: 3 [IU] via SUBCUTANEOUS
  Administered 2012-08-16: 11 [IU] via SUBCUTANEOUS
  Administered 2012-08-16: 3 [IU] via SUBCUTANEOUS

## 2012-08-14 MED ORDER — GLUCERNA SHAKE PO LIQD
237.0000 mL | ORAL | Status: DC
  Administered 2012-08-16: 237 mL via ORAL

## 2012-08-14 MED ORDER — POTASSIUM CHLORIDE 10 MEQ/100ML IV SOLN
10.0000 meq | INTRAVENOUS | Status: AC
  Filled 2012-08-14 (×2): qty 100

## 2012-08-14 MED ORDER — FUROSEMIDE 10 MG/ML IJ SOLN
40.0000 mg | Freq: Every day | INTRAMUSCULAR | Status: DC
  Administered 2012-08-14: 40 mg via INTRAVENOUS
  Filled 2012-08-14 (×2): qty 4

## 2012-08-14 NOTE — Progress Notes (Signed)
Hypoglycemic Event  CBG: 66  Treatment: 15 GM carbohydrate snack  Symptoms: None  Follow-up CBG: [id number] CBG Result:103  Possible Reasons for Event: Unknown  Comments/MD notified:     Donnamae Jude  Remember to initiate Hypoglycemia Order Set & complete

## 2012-08-14 NOTE — Progress Notes (Signed)
Hypoglycemic Event  CBG: 66  Treatment: 15 GM carbohydrate snack  Symptoms: None  Follow-up CBG: Time:0625 CBG Result: 66  Possible Reasons for Event: Unknown  Comments/MD notified: Will recheck in 15 minutes    Carmen [name]  Remember to initiate Hypoglycemia Order Set & complete

## 2012-08-14 NOTE — Progress Notes (Signed)
SATURATION QUALIFICATIONS: (This note is used to comply with regulatory documentation for home oxygen)  Patient Saturations on Room Air at Rest = 85%  Patient Saturations on Room Air while Ambulating = 81%  Patient Saturations on 2 Liters of oxygen while Ambulating = 95%  Please briefly explain why patient needs home oxygen:

## 2012-08-14 NOTE — Progress Notes (Signed)
Patients potassium = 3.1 this am.  NP Claiborne Billings notified. RN will continue to monitor. Louretta Parma, RN

## 2012-08-14 NOTE — Progress Notes (Signed)
INITIAL NUTRITION ASSESSMENT  DOCUMENTATION CODES Per approved criteria  -Not Applicable   INTERVENTION: 1. Has Glucerna Shake po TID, each supplement provides 220 kcal and 10 grams of protein. Will change to once daily to limit fluid intake 2. Add Ensure Pudding po BID, each supplement provides 170 kcal and 4 grams of protein. This supplement has similar carbohydrate content as Glucerna shakes.    NUTRITION DIAGNOSIS: Inadequate oral intake related to decreased appetite as evidenced by hx of weight loss.   Goal: PO intake to meet >/=90% estimated nutrition needs  Monitor:  PO intake, weight trends, labs, I/O's   Reason for Assessment: Consult  79 y.o. female  Admitting Dx: Respiratory Failure  ASSESSMENT: Pt admitted with respiratory failure related to acute diastolic vs systolic heart failure. Pt with edema on admission, currently being diuresed, and down 5 lbs.  RD consulted for assessment for pt with low albumin, edema possibly related to malnutrition.   Pt stated that she previously was not eating well and had been down to 119 lbs. Now gaining weight back, question how much of this is true weight gain vs fluid weight. Drinks Glucerna at home.   Nutrition Focused Physical Exam:  Subcutaneous Fat:  Orbital Region: WNL Upper Arm Region: WNL Thoracic and Lumbar Region: mild wasting  Muscle:  Temple Region: mild wasting  Clavicle Bone Region: mild wasting  Clavicle and Acromion Bone Region: WNL Scapular Bone Region: mild wasting  Dorsal Hand: n/a Patellar Region: WNL Anterior Thigh Region: WNL Posterior Calf Region: WNL  Edema: present   Given hx of weight loss and mild wasting, pt likely with some level of malnutrition. Eating better at this time.    Height: Ht Readings from Last 1 Encounters:  08/12/12 5\' 4"  (1.626 m)    Weight: Wt Readings from Last 1 Encounters:  08/14/12 141 lb 15.6 oz (64.4 kg)    Ideal Body Weight: 120 lbs   % Ideal Body Weight:  118%  Wt Readings from Last 10 Encounters:  08/14/12 141 lb 15.6 oz (64.4 kg)  05/29/12 112 lb (50.803 kg)  05/29/12 112 lb (50.803 kg)  04/30/12 116 lb 13.5 oz (53 kg)  03/04/12 110 lb 7.2 oz (50.1 kg)  11/13/11 117 lb (53.071 kg)  10/17/11 114 lb 6.7 oz (51.9 kg)  07/31/11 123 lb 7.3 oz (56 kg)  07/31/11 123 lb 7.3 oz (56 kg)  07/06/11 147 lb (66.679 kg)    Usual Body Weight: 112 lbs most recently   % Usual Body Weight: 126%  BMI:  Body mass index is 24.36 kg/(m^2). WNL   Estimated Nutritional Needs: Kcal: 1400-1600 Protein: 65-75 gm  Fluid: 1 L per fluid restriction   Skin: intact   Diet Order: Carb Control  EDUCATION NEEDS: -No education needs identified at this time   Intake/Output Summary (Last 24 hours) at 08/14/12 1417 Last data filed at 08/14/12 1345  Gross per 24 hour  Intake   1303 ml  Output   1500 ml  Net   -197 ml    Last BM: none documented   Labs:   Recent Labs Lab 08/12/12 1521 08/13/12 0500 08/13/12 1641 08/14/12 0354  NA  --  141 136 139  K  --  2.9* 3.4* 3.1*  CL  --  105 100 102  CO2  --  28 28 29   BUN  --  10 10 11   CREATININE  --  0.90 0.97 0.91  CALCIUM  --  7.6* 7.7* 8.4  MG  1.5  --   --   --   GLUCOSE  --  81 450* 95    CBG (last 3)   Recent Labs  08/14/12 0620 08/14/12 0633 08/14/12 1145  GLUCAP 66* 103* 127*    Scheduled Meds: . ipratropium  0.5 mg Nebulization Q4H   And  . albuterol  2.5 mg Nebulization Q4H  . aspirin EC  325 mg Oral Daily  . atorvastatin  10 mg Oral q1800  . budesonide (PULMICORT) nebulizer solution  0.5 mg Nebulization BID  . enoxaparin (LOVENOX) injection  40 mg Subcutaneous Q24H  . feeding supplement  237 mL Oral TID BM  . furosemide  40 mg Intravenous Daily  . hydrALAZINE  100 mg Oral TID  . insulin aspart  0-15 Units Subcutaneous TID WC  . isosorbide mononitrate  30 mg Oral Daily  . losartan  100 mg Oral Daily  . magnesium chloride  1 tablet Oral BID  . metoCLOPramide  5 mg  Oral QID  . metoprolol  100 mg Oral BID  . omega-3 acid ethyl esters  1 g Oral TID  . potassium chloride  40 mEq Oral BID  . sodium chloride  3 mL Intravenous Q12H  . spironolactone  100 mg Oral Daily    Continuous Infusions:   Past Medical History  Diagnosis Date  . Diabetes mellitus   . Hypertension   . Coronary artery disease   . Renal disorder   . Herniated lumbar intervertebral disc   . Cataract     cataract surgery scheduled for 03/31/11, 2 - left eye, 1 - right eye  . Renovascular hypertension      s/p left renal artery stent 12/2007.  S/P balloon angioplasty on 02/16/10 for ISR, BP was  controlled well since then.     . Claudication in peripheral vascular disease     02/16/10: Left CIA 9.0x28 Omnilink and REIA 8.0x40 seff expanding Zilver.   right subclavian artery stent 03/18/2008,  . S/P carotid endarterectomy      left carotid endarterectomy  1991; Stroke and TIA in 1999 with right sided weakness, now with residual right arm weakness  . Stroke     TIA  . Peripheral vascular disease   . Anxiety     Past Surgical History  Procedure Laterality Date  . Appendectomy    . Abdominal hysterectomy    . Ureteral stent placement    . Carotid endarterectomy    . Laminectomy    . Back surgery    . Colonoscopy  07/30/2011    Procedure: COLONOSCOPY;  Surgeon: Louis Meckel, MD;  Location: [facility] ENDOSCOPY;  Service: Endoscopy;  Laterality: N/A;  gi bleed  . Esophagogastroduodenoscopy  07/30/2011    Procedure: ESOPHAGOGASTRODUODENOSCOPY (EGD);  Surgeon: Louis Meckel, MD;  Location: [facility] ENDOSCOPY;  Service: Endoscopy;  Laterality: N/A;  . Pacemaker insertion      Clarene Duke RD, LDN Pager [phone] After Hours pager [phone]

## 2012-08-14 NOTE — Progress Notes (Signed)
Patient BP is 198//87.  NP Claiborne Billings notified.  IV Hydralazine given x1.  RN will continue to monitor. Louretta Parma, RN

## 2012-08-14 NOTE — Progress Notes (Signed)
[facility] PROGRESS NOTE  Carmen [name] GNF:[id number] DOB: 1933-08-09 DOA: 08/12/2012 PCP: Gwynneth Aliment, MD  Assessment/Plan  Acute hypoxic respiratory failure likely due to hypervolemia from it acute diastolic versus systolic heart failure in the setting of underlying CAD and extensive atherosclerosis.  Her BNP is elevated and she is cardiomegaly and interstitial edema on her chest x-ray.  Echo from 2013 demonstrated moderate concentric hypertrophy with normal systolic function, EF of 60-65% and no comment regarding diastolic dysfunction.  -2.8 L overnight -  Echo report pending -  Increase IV Lasix to 40 mg once daily -  Patient is on ACE inhibitor, beta blocker, hydralazine but not imdur, statin and aspirin -  Wean oxygen as tolerated -  Goal of negative approximately 2 L  Wheezing, possibly due to acute diastolic versus systolic heart failure exacerbation, awaiting echo, versus COPD exacerbation, or vocal cord dysfunction. Patient states she had some relief with nebulizer treatments and wheezing is improved today.   -  Continue diuresis for today -  Continue DuoNebs and budesonide for now -  If wheezing worsens start antibiotics and oral prednisone  Hypokalemia, I believe the patient has some sort of underlying metabolic or genetic disorder that causes profound recurrent hypokalemia. The patient states she has not taken Lasix in the last month and has been started on spironolactone in the meantime however she continued to have severe hypokalemia. -  ACTH level pending -  CT chest negative for malignancy -  Collect a 24-hour urine for potassium excretion and 5-HIAA -  Aldosterone and cortisol levels have previously been normal.  Anasarca, likely multifactorial from mildly low albumin at 2.4 and possible underlying heart failure -  No ascites on abdominal ultrasound -  Continue diuresis -  Await urine protein to creatinine ratio -  Followup echo -  Encourage oral intake and  maximize nutritional status.  Severe uncontrolled hypertension, blood pressures at baseline 200s over 70s to 90s.   -  Continue hydralazine 100 mg 3 times a day  -  Continue spironolactone to 100 mg daily -  Continue selective beta blocker metoprolol 100 mg twice a day in the setting of no known COPD -  Increase losartan -  Started on clonidine overnight -  Increase Imdur -  Continue hydralazine IV when necessary   Diabetes mellitus with hypoglycemia at admission, FS stable the 100 range.  Patient states she has frequent first thing in the morning hypoglycemia.   -  Increase sliding scale insulin -  Discontinue Lantus -  Last hemoglobin A1c was 14.4 in March of 2014 -  Patient definitely needs followup with endocrinology   History of CVA, increased to full dose aspirin for secondary prevention Tobacco abuse, advised to quit smoking Transaminitis, appears to be chronic and stable, no documentation of cirrhosis on ultrasound of the liver Right renal cyst, outpatient followup by primary care doctor  Diet:  Carbohydrate controlled  Access:  PIV  IVF:  Off  Proph:  Lovenox  Code Status: Full code  Family Communication: Spoke with the patient and her granddaughter who is at bedside  Disposition Plan: Pending improvement in dyspnea   Consultants:  None  Procedures:  Abdominal ultrasound  CT chest with contrast  Chest x-ray  Echo  Antibiotics:  None   HPI/Subjective:  The patient states that she continues to have shortness of breath and wheezing which is improving.  Her blood pressure is have been elevated to the 200s over 70s to 90s for several years  and remained elevated overnight. She was started on clonidine. The patient states she was previously on an unknown amount of clonidine but because she did no have improvement with her blood pressures it was discontinued.  Objective: Filed Vitals:   08/14/12 0517 08/14/12 0653 08/14/12 0654 08/14/12 0818  BP: 198/87 222/98  206/94   Pulse: 64 79 63   Temp: 98 F (36.7 C)     TempSrc: Oral     Resp: 20     Height:      Weight: 64.4 kg (141 lb 15.6 oz)     SpO2: 96%   94%    Intake/Output Summary (Last 24 hours) at 08/14/12 0827 Last data filed at 08/14/12 0814  Gross per 24 hour  Intake    823 ml  Output    800 ml  Net     23 ml   Filed Weights   08/12/12 1700 08/13/12 0500 08/14/12 0517  Weight: 66.316 kg (146 lb 3.2 oz) 64.093 kg (141 lb 4.8 oz) 64.4 kg (141 lb 15.6 oz)    Exam:   General:  African American female,  No acute distress  HEENT:  NCAT, MMM, nasal cannula in place  Cardiovascular:  RRR, nl S1, S2, no mrg, 2+ pulses, warm extremities  Respiratory:  Rales at the bilateral bases, expiratory wheezes throughout with moderate air movement, no rhonchi, no increased WOB  Abdomen:   NABS, soft, NT/ND  MSK:   Normal tone and bulk, 2+  woody pitting edema LEE  Neuro:  Grossly intact  Data Reviewed: Basic Metabolic Panel:  Recent Labs Lab 08/12/12 1112 08/12/12 1521 08/13/12 0500 08/13/12 1641 08/14/12 0354  NA 142  --  141 136 139  K 2.4*  --  2.9* 3.4* 3.1*  CL 105  --  105 100 102  CO2 28  --  28 28 29   GLUCOSE 119*  --  81 450* 95  BUN 10  --  10 10 11   CREATININE 0.92  --  0.90 0.97 0.91  CALCIUM 7.9*  --  7.6* 7.7* 8.4  MG  --  1.5  --   --   --    Liver Function Tests:  Recent Labs Lab 08/12/12 1521 08/13/12 0500  AST 80* 79*  ALT 73* 71*  ALKPHOS 111 115  BILITOT 0.2* 0.2*  PROT 6.0 5.8*  ALBUMIN 2.6* 2.4*   No results found for this basename: LIPASE, AMYLASE,  in the last 168 hours No results found for this basename: AMMONIA,  in the last 168 hours CBC:  Recent Labs Lab 08/12/12 1112 08/13/12 0500 08/14/12 0354  WBC 3.6* 4.2 4.4  HGB 12.7 12.4 12.6  HCT 37.3 36.1 36.5  MCV 100.3* 98.9 98.6  PLT 166 181 187   Cardiac Enzymes:  Recent Labs Lab 08/12/12 1112  TROPONINI <0.30   BNP (last 3 results)  Recent Labs  08/12/12 1112   PROBNP 2540.0*   CBG:  Recent Labs Lab 08/13/12 1619 08/13/12 2123 08/14/12 0607 08/14/12 0620 08/14/12 0633  GLUCAP 454* 205* 66* 66* 103*    No results found for this or any previous visit (from the past 240 hour(s)).   Studies: Dg Chest 2 View  08/12/2012   *RADIOLOGY REPORT*  Clinical Data: Edema, swelling, pain and legs when walking. History of hypertension and diabetes.  CHEST - 2 VIEW  Comparison: 11/11/2011  Findings: The heart is mildly enlarged.  Patient has left-sided transvenous pacemaker with leads to the right atrium and  right ventricle.  There are bilateral pleural effusions.  Bilateral lower lobe alveolar infiltrates are present, most consistent with infectious process. Stent identified in the upper mediastinum and upper abdomen.  IMPRESSION: 1.  Mild cardiomegaly. 2.  Bilateral lower lobe infiltrates and pleural effusions.   Original Report Authenticated By: Norva Pavlov, M.D.   Ct Chest W Contrast  08/12/2012   *RADIOLOGY REPORT*  Clinical Data: Cough and weight loss.  Evaluate for potential malignancy.  CT CHEST WITH CONTRAST  Technique:  Multidetector CT imaging of the chest was performed following the standard protocol during bolus administration of intravenous contrast.  Contrast: 80mL OMNIPAQUE IOHEXOL 300 MG/ML  SOLN  Comparison: No priors.  Findings:  Mediastinum: Heart size is mildly enlarged left ventricular concentric hypertrophy.  There is no significant pericardial fluid, thickening or pericardial calcification.  There is atherosclerosis of the thoracic aorta, the great vessels of the mediastinum and the coronary arteries, including calcified atherosclerotic plaque in the left main, left anterior descending, left circumflex and right coronary arteries. Calcifications of the aortic valve. No pathologically enlarged mediastinal or hilar lymph nodes. Esophagus is unremarkable in appearance.  Left-sided pacemaker in place with lead tips terminating in the right  atrial appendage and right ventricular apex.  Lungs/Pleura: Mild diffuse interlobular septal thickening and thickening of the fissures, compatible with mild interstitial pulmonary edema.  Small bilateral pleural effusions.  Areas of passive atelectasis throughout the lower lobes of the lungs bilaterally.  Calcified granuloma in the left upper lobe.  No other definite suspicious-appearing pulmonary nodules or masses are identified.  Upper Abdomen: Extensive atherosclerosis.  Multiple low attenuation renal lesions bilaterally, similar to prior examinations, the largest of which are compatible with cysts, while the smaller lesions remain too small to definitively characterize.  Multifocal parenchymal calcification throughout the pancreas, compatible with sequelae of chronic pancreatitis.  Musculoskeletal: There are no aggressive appearing lytic or blastic lesions noted in the visualized portions of the skeleton.  IMPRESSION: 1.  No definite suspicious appearing pulmonary nodules or masses identified. 2.  Cardiomegaly with left ventricular concentric hypertrophy and evidence of interstitial pulmonary edema with small bilateral pleural effusions; findings suggestive of congestive heart failure. 3. Atherosclerosis, including left main three-vessel coronary artery disease.  4.  Calcified granuloma in the left upper lobe. 5.  Additional incidental findings, as above, similar to prior examinations.   Original Report Authenticated By: Trudie Reed, M.D.   US Abdomen Complete  08/12/2012   *RADIOLOGY REPORT*  Clinical Data:  Abnormal LFTs.  Question of ascites.  COMPLETE ABDOMINAL ULTRASOUND  Comparison:  04/18/2012  Findings:  Gallbladder:  Gallbladder has a normal appearance.  Gallbladder wall is 2.7 mm, within normal limits.  No stones or pericholecystic fluid.  No sonographic Murphy's sign.  Common bile duct:  6.5 mm  Liver:  A focal hyper echoic lesion is identified within the liver, 1.2 x 1.1 x 1.0 cm.  IVC:   Appears normal.  Pancreas:  The pancreas is not well seen because of overlying bowel gas.  Spleen:  3.7 cm in length, normal in appearance.  Right Kidney:  10.6 cm in length.  Echogenic parenchyma.  Cyst is 2.1 x 1.8 x 1.6 cm.  No hydronephrosis.  Left Kidney:  10.3 cm.  Not well seen.  No focal mass or hydronephrosis identified however.  Abdominal aorta:  Atherosclerotic, 2.0 cm maximum diameter.  IMPRESSION:  1.  No evidence for acute cholecystitis. 2.  Focal hyperechoic lesion within the liver, not seen on prior contrast enhanced  CT exams.  It is possible that lesion was not seen on previous studies because of its small size.  Findings favor a benign hemangioma. 3.  Right renal cyst. 4.  No hydronephrosis.   Original Report Authenticated By: Norva Pavlov, M.D.    Scheduled Meds: . ipratropium  0.5 mg Nebulization Q4H   And  . albuterol  2.5 mg Nebulization Q4H  . aspirin EC  325 mg Oral Daily  . atorvastatin  10 mg Oral q1800  . budesonide (PULMICORT) nebulizer solution  0.5 mg Nebulization BID  . enoxaparin (LOVENOX) injection  40 mg Subcutaneous Q24H  . feeding supplement  237 mL Oral TID BM  . furosemide  20 mg Intravenous Daily  . hydrALAZINE  100 mg Oral TID  . insulin aspart  0-9 Units Subcutaneous TID WC  . isosorbide mononitrate  15 mg Oral Daily  . losartan  50 mg Oral Daily  . magnesium chloride  1 tablet Oral BID  . metoCLOPramide  5 mg Oral QID  . metoprolol  100 mg Oral BID  . omega-3 acid ethyl esters  1 g Oral TID  . potassium chloride  40 mEq Oral BID  . sodium chloride  3 mL Intravenous Q12H  . spironolactone  100 mg Oral Daily   Continuous Infusions:   Active Problems:   HTN (hypertension), malignant   Hypokalemia   Tobacco abuse   DM (diabetes mellitus), type 2, uncontrolled, periph vascular complic   Claudication in peripheral vascular disease status post stent to common iliac artery and 2011 and right subclavian artery stent 2010   Retrovascular  hypertension status post left renal artery stent 2009 and balloon angioplasty for in-stent restenosis 2011   Hypoglycemia   History of CVA (cerebrovascular accident) with associated mild right upper extremity hemiplegia   Cardiac pacemaker for history of intermittent heart block   Protein-calorie malnutrition, severe    Time spent: 30 min    Hyder Deman  [facility] Pager [phone]. If 7PM-7AM, please contact night-coverage at [email or web address], password [facility] 08/14/2012, 8:27 AM  LOS: 2 days

## 2012-08-14 NOTE — Progress Notes (Signed)
Patients BP elevated 206/94.  NP Claiborne Billings notified.  PO Clonidine given x1. RN will continue to monitor. Louretta Parma, RN

## 2012-08-15 DIAGNOSIS — E43 Unspecified severe protein-calorie malnutrition: Secondary | ICD-10-CM

## 2012-08-15 LAB — GLUCOSE, CAPILLARY
Glucose-Capillary: 236 mg/dL — ABNORMAL HIGH (ref 70–99)
Glucose-Capillary: 167 mg/dL — ABNORMAL HIGH (ref 70–99)

## 2012-08-15 LAB — BASIC METABOLIC PANEL
BUN: 15 mg/dL (ref 6–23)
CO2: 31 mEq/L (ref 19–32)
GFR calc non Af Amer: 59 mL/min — ABNORMAL LOW (ref >90)
Glucose, Bld: 152 mg/dL — ABNORMAL HIGH (ref 70–99)
Potassium: 4 mEq/L (ref 3.5–5.1)

## 2012-08-15 LAB — CBC
HCT: 38.7 % (ref 36.0–46.0)
Hemoglobin: 12.7 g/dL (ref 12.0–15.0)
MCH: 33.5 pg (ref 26.0–34.0)
MCHC: 32.8 g/dL (ref 30.0–36.0)
MCV: 102.1 fL — ABNORMAL HIGH (ref 78.0–100.0)
RBC: 3.79 MIL/uL — ABNORMAL LOW (ref 3.87–5.11)

## 2012-08-15 MED ORDER — IPRATROPIUM BROMIDE 0.02 % IN SOLN
0.5000 mg | Freq: Three times a day (TID) | RESPIRATORY_TRACT | Status: DC
  Administered 2012-08-16 (×2): 0.5 mg via RESPIRATORY_TRACT
  Filled 2012-08-15 (×2): qty 2.5

## 2012-08-15 MED ORDER — FUROSEMIDE 10 MG/ML IJ SOLN
40.0000 mg | Freq: Three times a day (TID) | INTRAMUSCULAR | Status: DC
  Administered 2012-08-15 – 2012-08-16 (×3): 40 mg via INTRAVENOUS
  Filled 2012-08-15 (×3): qty 4

## 2012-08-15 MED ORDER — CLONIDINE HCL 0.2 MG PO TABS: 0.2000 mg | ORAL_TABLET | Freq: Three times a day (TID) | ORAL | Status: DC | PRN

## 2012-08-15 MED ORDER — ALBUTEROL SULFATE (5 MG/ML) 0.5% IN NEBU
2.5000 mg | INHALATION_SOLUTION | Freq: Three times a day (TID) | RESPIRATORY_TRACT | Status: DC
  Administered 2012-08-16 (×2): 2.5 mg via RESPIRATORY_TRACT
  Filled 2012-08-15 (×2): qty 0.5

## 2012-08-15 MED ORDER — ALBUTEROL SULFATE (5 MG/ML) 0.5% IN NEBU: 2.5000 mg | INHALATION_SOLUTION | RESPIRATORY_TRACT | Status: DC | PRN

## 2012-08-15 MED ORDER — CLONIDINE HCL 0.1 MG PO TABS
0.1000 mg | ORAL_TABLET | Freq: Three times a day (TID) | ORAL | Status: DC
  Administered 2012-08-15 – 2012-08-16 (×5): 0.1 mg via ORAL
  Filled 2012-08-15 (×7): qty 1

## 2012-08-15 MED ORDER — LABETALOL HCL 300 MG PO TABS
300.0000 mg | ORAL_TABLET | Freq: Three times a day (TID) | ORAL | Status: DC
  Administered 2012-08-15 – 2012-08-16 (×5): 300 mg via ORAL
  Filled 2012-08-15 (×7): qty 1

## 2012-08-15 NOTE — Progress Notes (Signed)
[facility] PROGRESS NOTE  Carmen [name] ZOX:[id number] DOB: Jan 21, 1934 DOA: 08/12/2012 PCP: Gwynneth Aliment, MD  Assessment/Plan  Acute hypoxic respiratory failure likely due to hypervolemia from it acute diastolic versus systolic heart failure in the setting of underlying CAD and extensive atherosclerosis.  Her BNP is elevated and she is cardiomegaly and interstitial edema on her chest x-ray.  Echo from 2013 demonstrated moderate concentric hypertrophy with normal systolic function, EF of 60-65% and no comment regarding diastolic dysfunction.  - overnight -  Echo report pending -  Increase IV Lasix to 40 mg TID today -  Wean oxygen as tolerated -  Once on room air, please perform ambulation with pulse oximetry  Wheezing, possibly due to acute diastolic versus systolic heart failure exacerbation, awaiting echo, versus COPD exacerbation, or vocal cord dysfunction. Much improved today.  -  Continue diuresis as above -  Continue DuoNebs and budesonide for now  Hypokalemia, may be because she has not been eating well and has mixed up her medications. She has underlying cataracts which cannot be operated on because of her elevated blood pressures. Alternatively, she may have some sort of underlying metabolic or genetic disorder that causes profound recurrent hypokalemia.  -  ACTH level 22 -  CT chest negative for malignancy -  24-hour urine for potassium excretion:  Sodium 91 mEq per liter, potassium 18 mEq per liter, total urine volume 2400 mL, 24 hour sodium 218 mEq per day, 24-hour potassium of 43 mEq per day (within normal limits) -  5-HIAA pending -  Aldosterone and cortisol levels have previously been normal. -  Continue potassium 3 times daily while increasing diuretic  Anasarca, likely multifactorial from mildly low albumin at 2.4 and possible underlying heart failure.   -  No ascites on abdominal ultrasound -  Continue diuresis -  Urine protein to creatinine ratio 2.99,  elevated.  Will need nephrology follow up.  On maximum dose ARB -  Followup echo -  Encourage oral intake and maximize nutritional status -  Appreciate nutrition recommendations  Severe uncontrolled hypertension, blood pressures at baseline 200s over 70s to 90s.   -  Continue hydralazine 100 mg 3 times a day  -  Continue spironolactone to 100 mg daily -  Trial of labetalol 300 mg 3 times a day -  Maximum dose losartan -  Schedule clonidine -  Continue Imdur -  Continue hydralazine IV when necessary   Diabetes mellitus with hypoglycemia at admission, FS stable the 100 range.  Patient states she has frequent first thing in the morning hypoglycemia.   -  Continue sliding scale insulin -  Last hemoglobin A1c was 14.4 in March of 2014 -  Followup with endocrinology   History of CVA, increased to full dose aspirin for secondary prevention Tobacco abuse, advised to quit smoking Transaminitis, appears to be chronic and stable, no documentation of cirrhosis on ultrasound of the liver Right renal cyst, outpatient followup by primary care doctor  Diet:  Carbohydrate controlled  Access:  PIV  IVF:  Off  Proph:  Lovenox  Code Status: Full code  Family Communication: Spoke with the patient alone Disposition Plan: Pending improvement in dyspnea, physical therapy consultation, likely home tomorrow.   Consultants:  None  Procedures:  Abdominal ultrasound  CT chest with contrast  Chest x-ray  Echo  Antibiotics:  None   HPI/Subjective:  The patient states that she feels better today. She has less wheezing and is less Tyana Butzer of breath. She states that occasionally  her oxygen has come off and she has not felt Traylon Schimming of breath. She is eating somewhat better and making plenty of urine.  Objective: Filed Vitals:   08/15/12 9604 08/15/12 0653 08/15/12 0733 08/15/12 0900  BP: 194/70 193/74  198/67  Pulse: 69 66  72  Temp:    97.2 F (36.2 C)  TempSrc:    Oral  Resp:    20   Height:      Weight:      SpO2:   100% 93%    Intake/Output Summary (Last 24 hours) at 08/15/12 1114 Last data filed at 08/15/12 0945  Gross per 24 hour  Intake    720 ml  Output   1000 ml  Net   -280 ml   Filed Weights   08/13/12 0500 08/14/12 0517 08/15/12 0436  Weight: 64.093 kg (141 lb 4.8 oz) 64.4 kg (141 lb 15.6 oz) 64.2 kg (141 lb 8.6 oz)    Exam:   General:  African American female,  No acute distress  HEENT:  NCAT, MMM, nasal cannula in place, JVP to the angle of the mandible while sitting upright  Cardiovascular:  RRR, nl S1, S2, no mrg, 2+ pulses, warm extremities  Respiratory:  Rales at the bilateral bases, no wheezes, no rhonchi, no increased WOB  Abdomen:   NABS, soft, NT/ND  MSK:   Normal tone and bulk, 2+  woody pitting edema LEE, stable from prior  Neuro:  Grossly intact  Data Reviewed: Basic Metabolic Panel:  Recent Labs Lab 08/12/12 1112 08/12/12 1521 08/13/12 0500 08/13/12 1641 08/14/12 0354 08/15/12 0432  NA 142  --  141 136 139 136  K 2.4*  --  2.9* 3.4* 3.1* 4.0  CL 105  --  105 100 102 100  CO2 28  --  28 28 29 31   GLUCOSE 119*  --  81 450* 95 152*  BUN 10  --  10 10 11 15   CREATININE 0.92  --  0.90 0.97 0.91 0.92  CALCIUM 7.9*  --  7.6* 7.7* 8.4 8.6  MG  --  1.5  --   --   --   --    Liver Function Tests:  Recent Labs Lab 08/12/12 1521 08/13/12 0500  AST 80* 79*  ALT 73* 71*  ALKPHOS 111 115  BILITOT 0.2* 0.2*  PROT 6.0 5.8*  ALBUMIN 2.6* 2.4*   No results found for this basename: LIPASE, AMYLASE,  in the last 168 hours No results found for this basename: AMMONIA,  in the last 168 hours CBC:  Recent Labs Lab 08/12/12 1112 08/13/12 0500 08/14/12 0354 08/15/12 0432  WBC 3.6* 4.2 4.4 4.5  HGB 12.7 12.4 12.6 12.7  HCT 37.3 36.1 36.5 38.7  MCV 100.3* 98.9 98.6 102.1*  PLT 166 181 187 212   Cardiac Enzymes:  Recent Labs Lab 08/12/12 1112  TROPONINI <0.30   BNP (last 3 results)  Recent Labs   08/12/12 1112  PROBNP 2540.0*   CBG:  Recent Labs Lab 08/14/12 1145 08/14/12 1628 08/14/12 2139 08/15/12 0248 08/15/12 0627  GLUCAP 127* 198* 106* 144* 160*    No results found for this or any previous visit (from the past 240 hour(s)).   Studies: No results found.  Scheduled Meds: . ipratropium  0.5 mg Nebulization Q4H   And  . albuterol  2.5 mg Nebulization Q4H  . aspirin EC  325 mg Oral Daily  . atorvastatin  10 mg Oral q1800  . budesonide (PULMICORT)  nebulizer solution  0.5 mg Nebulization BID  . cloNIDine  0.1 mg Oral TID  . enoxaparin (LOVENOX) injection  40 mg Subcutaneous Q24H  . feeding supplement  1 Container Oral BID BM  . feeding supplement  237 mL Oral Q24H  . furosemide  40 mg Intravenous TID  . hydrALAZINE  100 mg Oral TID  . insulin aspart  0-15 Units Subcutaneous TID WC  . isosorbide mononitrate  30 mg Oral Daily  . labetalol  300 mg Oral TID  . losartan  100 mg Oral Daily  . magnesium chloride  1 tablet Oral BID  . metoCLOPramide  5 mg Oral QID  . omega-3 acid ethyl esters  1 g Oral TID  . sodium chloride  3 mL Intravenous Q12H  . spironolactone  100 mg Oral Daily   Continuous Infusions:   Active Problems:   HTN (hypertension), malignant   Hypokalemia   Tobacco abuse   DM (diabetes mellitus), type 2, uncontrolled, periph vascular complic   Claudication in peripheral vascular disease status post stent to common iliac artery and 2011 and right subclavian artery stent 2010   Retrovascular hypertension status post left renal artery stent 2009 and balloon angioplasty for in-stent restenosis 2011   Hypoglycemia   History of CVA (cerebrovascular accident) with associated mild right upper extremity hemiplegia   Cardiac pacemaker for history of intermittent heart block   Protein-calorie malnutrition, severe    Time spent: 30 min    Jaxxen Voong  [facility] Pager [phone]. If 7PM-7AM, please contact night-coverage at [email or web address],  password [facility] 08/15/2012, 11:14 AM  LOS: 3 days

## 2012-08-16 DIAGNOSIS — I509 Heart failure, unspecified: Secondary | ICD-10-CM

## 2012-08-16 DIAGNOSIS — J9600 Acute respiratory failure, unspecified whether with hypoxia or hypercapnia: Secondary | ICD-10-CM | POA: Diagnosis present

## 2012-08-16 DIAGNOSIS — I5033 Acute on chronic diastolic (congestive) heart failure: Secondary | ICD-10-CM | POA: Diagnosis present

## 2012-08-16 LAB — CBC
MCH: 33.6 pg (ref 26.0–34.0)
MCV: 100 fL (ref 78.0–100.0)
Platelets: 200 10*3/uL (ref 150–400)
RBC: 3.27 MIL/uL — ABNORMAL LOW (ref 3.87–5.11)
RDW: 14.1 % (ref 11.5–15.5)
WBC: 4.2 10*3/uL (ref 4.0–10.5)

## 2012-08-16 LAB — BASIC METABOLIC PANEL
CO2: 31 mEq/L (ref 19–32)
Calcium: 8.2 mg/dL — ABNORMAL LOW (ref 8.4–10.5)
Creatinine, Ser: 1.15 mg/dL — ABNORMAL HIGH (ref 0.50–1.10)
GFR calc non Af Amer: 45 mL/min — ABNORMAL LOW (ref >90)
Sodium: 139 mEq/L (ref 135–145)

## 2012-08-16 LAB — GLUCOSE, CAPILLARY: Glucose-Capillary: 126 mg/dL — ABNORMAL HIGH (ref 70–99)

## 2012-08-16 MED ORDER — MAGNESIUM CHLORIDE 64 MG PO TBEC: 1.0000 | DELAYED_RELEASE_TABLET | Freq: Two times a day (BID) | ORAL | Status: DC

## 2012-08-16 MED ORDER — HYDRALAZINE HCL 100 MG PO TABS: 100.0000 mg | ORAL_TABLET | Freq: Three times a day (TID) | ORAL | Status: DC

## 2012-08-16 MED ORDER — LOSARTAN POTASSIUM 100 MG PO TABS: 100.0000 mg | ORAL_TABLET | Freq: Every day | ORAL | Status: DC

## 2012-08-16 MED ORDER — CLONIDINE HCL 0.1 MG PO TABS: 0.1000 mg | ORAL_TABLET | Freq: Three times a day (TID) | ORAL | Status: DC

## 2012-08-16 MED ORDER — INSULIN NPH ISOPHANE & REGULAR (70-30) 100 UNIT/ML ~~LOC~~ SUSP: 15.0000 [IU] | Freq: Two times a day (BID) | SUBCUTANEOUS | Status: DC

## 2012-08-16 MED ORDER — SPIRONOLACTONE 50 MG PO TABS: 100.0000 mg | ORAL_TABLET | Freq: Every day | ORAL | Status: DC

## 2012-08-16 MED ORDER — LABETALOL HCL 300 MG PO TABS: 300.0000 mg | ORAL_TABLET | Freq: Three times a day (TID) | ORAL | Status: DC

## 2012-08-16 NOTE — Progress Notes (Signed)
Patient admitted on 6.7.14 for SOB secondary to fluid volume overload.  Patient Patient is currently active with [facility] for chronic disease management services.  Patient has been engaged by a [employer] and LCSW.  Our community based plan of care has focused on disease management of CHF, medication procurement/adherence, and community resource support  Patient will receive a post discharge transition of care call and will be evaluated for monthly home visits for assessments and disease process education.  Met with patient, daughter Gavin Pound), and Dr Waymon Amato at bedside.  Patient indicated that she can often not afford PCP office visit copays and therefore does not go. Reached out to PCP office to inform them of this barrier to care.  Left message to same.  We will follow up on this concern post discharge.  Requested attending to order [facility] and telehealth monitoring as patient self admits to not being consistent to weighing.  Made inpatient Case Manager Awilda Bill RN aware that [facility] following. Of note, [facility] Care Management services does not replace or interfere with any services that are arranged by inpatient case management or social work.  For additional questions or referrals please contact Anibal Henderson BSN RN [facility] Liaison at [phone].

## 2012-08-16 NOTE — Progress Notes (Signed)
Pt given discharge instructions, questions answers verbalized understanding. Pt belonging packed up. IV and Telemetry discontinued. O2 at the bedside brought to pt from [facility].

## 2012-08-16 NOTE — Progress Notes (Signed)
Physical Therapy Discharge Patient Details Name: Carmen [name] MRN: [id number] DOB: [birth year] Today's Date: 08/16/2012 Time: 1213-1229 PT Time Calculation (min): 16 min  Patient discharged from PT services secondary to no further PT needs identified due to patient functioning at baseline.  Please see latest therapy progress note for current level of functioning and progress toward goals.    Progress and discharge plan discussed with patient and/or caregiver: Patient/Caregiver agrees with plan Pt safe to return home with family to provide 24/7 assist/supervision.  GP     Marcene Brawn 08/16/2012, 12:48 PM

## 2012-08-16 NOTE — Progress Notes (Addendum)
SATURATION QUALIFICATIONS: (This note is used to comply with regulatory documentation for home oxygen)  Patient Saturations on Room Air at Rest = 94%   Patient Saturations on Room Air while Ambulating = 85%  Patient Sats on ambulation on 2L during ambulation 100%  Please briefly explain why patient needs home oxygen:

## 2012-08-16 NOTE — Evaluation (Signed)
Physical Therapy Evaluation Patient Details Name: Carmen [name] MRN: [id number] DOB: March 23, 1933 Today's Date: 08/16/2012 Time: 1213-1229 PT Time Calculation (min): 16 min  PT Assessment / Plan / Recommendation Clinical Impression  Pt admitted for edema to bilat LEs, UEs, face and abdomen. Pt functioning at baseline. Pt with 24/7 assist/supervision at home. Pt safe for d/c home when medically stable. Acute PT signing off on pt. Please re-consult if needed in future.    PT Assessment  Patent does not need any further PT services    Follow Up Recommendations  No PT follow up;Supervision/Assistance - 24 hour    Does the patient have the potential to tolerate intense rehabilitation      Barriers to Discharge        Equipment Recommendations  None recommended by PT    Recommendations for Other Services     Frequency      Precautions / Restrictions Precautions Precautions: Fall   Pertinent Vitals/Pain Pt denies pain, c/o bilat knee stiffness but improved post ambulation      Mobility  Bed Mobility Bed Mobility: Not assessed Transfers Transfers: Sit to Stand;Stand to Sit Sit to Stand: 5: Supervision;With upper extremity assist;With armrests;From chair/3-in-1 Stand to Sit: 5: Supervision;With upper extremity assist;With armrests;To chair/3-in-1 Details for Transfer Assistance: increased time but safe Ambulation/Gait Ambulation/Gait Assistance: 4: Min guard Ambulation Distance (Feet): 200 Feet Assistive device: None Ambulation/Gait Assistance Details: no LOB, pt with c/o bilat knee stiffness but improved by end of ambulation. most likely due to edema Gait Pattern: Step-through pattern;Decreased stride length Gait velocity: wfl for age Stairs: No    Exercises     PT Diagnosis:    PT Problem List:   PT Treatment Interventions:     PT Goals Acute Rehab PT Goals PT Goal Formulation:  (n/a)  Visit Information  Last PT Received On: 08/16/12 Assistance Needed: +1     Subjective Data  Subjective: Pt recieved sitting up in chair agreeable to PT.   Prior Functioning  Home Living Lives With: Spouse;Son Available Help at Discharge: Family;Available 24 hours/day Type of Home: House Home Access: Stairs to enter [employer] of Steps:  (threshold step) Home Layout: One level Bathroom Shower/Tub: Engineer, manufacturing systems: Standard Bathroom Accessibility: Yes How Accessible: Accessible via walker Home Adaptive Equipment: Shower chair with back;Walker - rolling;Straight cane Prior Function Level of Independence: Independent Able to Take Stairs?: Yes Driving: No Vocation: Retired Musician: No difficulties Dominant Hand: Left    Cognition  Cognition Arousal/Alertness: Awake/alert Behavior During Therapy: WFL for tasks assessed/performed Overall Cognitive Status: Within Functional Limits for tasks assessed    Extremity/Trunk Assessment Right Upper Extremity Assessment RUE ROM/Strength/Tone: Deficits RUE ROM/Strength/Tone Deficits: previous CVA, decresaed funcion, increased flexion tone Left Upper Extremity Assessment LUE ROM/Strength/Tone: Within functional levels Right Lower Extremity Assessment RLE ROM/Strength/Tone: WFL for tasks assessed Left Lower Extremity Assessment LLE ROM/Strength/Tone: WFL for tasks assessed   Balance    End of Session PT - End of Session Equipment Utilized During Treatment: Gait belt Activity Tolerance: Patient tolerated treatment well Patient left: in chair;with call bell/phone within reach Nurse Communication: Mobility status  GP     Marcene Brawn 08/16/2012, 12:46 PM  Lewis Shock, PT, DPT Pager #: [phone] Office #: [phone]

## 2012-08-16 NOTE — Discharge Summary (Signed)
Physician Discharge Summary  AVRIL BUSSER ZOX:[id number] DOB: Dec 24, 1933 DOA: 08/12/2012  PCP: Gwynneth Aliment, MD  Admit date: 08/12/2012 Discharge date: 08/16/2012  Time spent: Greater than 30 minutes  Recommendations for Outpatient Follow-up:  1. Dr. Dorothyann Peng, PCP in 5 days with repeat labs (CBC, magnesium and CMP) 2. With Dr. Yates Decamp, Cardiology in 1 week. 3. Dr. Reather Littler, Endocrinology in 2 weeks. 4. Home health RN 5. Oxygen via nasal cannula at 2 L per minute, continuously 6. Right renal cyst and focal hyperechoic lesion in liver: Outpatient evaluation as deemed necessary.  Discharge Diagnoses:  Active Problems:   HTN (hypertension), malignant   Hypokalemia   Tobacco abuse   DM (diabetes mellitus), type 2, uncontrolled, periph vascular complic   Claudication in peripheral vascular disease status post stent to common iliac artery and 2011 and right subclavian artery stent 2010   Retrovascular hypertension status post left renal artery stent 2009 and balloon angioplasty for in-stent restenosis 2011   Hypoglycemia   History of CVA (cerebrovascular accident) with associated mild right upper extremity hemiplegia   Cardiac pacemaker for history of intermittent heart block   Protein-calorie malnutrition, severe   Discharge Condition: Improved & Stable  Diet recommendation: Diabetic and heart healthy diet.  Filed Weights   08/14/12 0517 08/15/12 0436 08/16/12 0505  Weight: 64.4 kg (141 lb 15.6 oz) 64.2 kg (141 lb 8.6 oz) 63.8 kg (140 lb 10.5 oz)    History of present illness:  79 y.o. female with widespread atherosclerosis, lifelong tobacco abuse, episodes of recurrent severe hypokalemia and hypoglycemia who presents today to the emergency room with progressive edema of her lower extremities, upper extremities, face and increased abdominal girth. None of her medications have changed recently. She denies any fevers or chills. She reports eating very little. She continues to  smoke cigarettes. She denies any pain. In discussing with her primary cardiologist, patient has been extensively evaluated for secondary hypertension and essentially negative workup. It is felt that patient is not compliant with her medications, diet or M.D. followups.   Hospital Course:  1. Acute hypoxic respiratory failure: Likely secondary to acute on chronic diastolic CHF. Patient denies ever having dyspnea. Today on ambulation of oxygen, oxygen saturations dropped to 85%. She will go home on oxygen at least temporarily and this can be reevaluated as outpatient. Another possibility could be COPD-however no clinical bronchospasm at this time. Will not DC on nebulizers or inhalers.  2. Acute on chronic diastolic CHF: Lungs are clinically clear to auscultation and peripheral edema has almost resolved. Real concerned regarding compliance with medications at home. She and her daughter have been counseled regarding compliance with medications and M.D. followups. Home health RN and weight monitoring have been arranged. She was placed on IV Lasix to good effect. She will continue home dose of Lasix and increased dose of Aldactone. Echo from 2013 showed EF 60-65%. Per cardiology, repeat echo this admission shows normal EF, LVH, left pleural effusion, severe pulmonary hypertension. 3. Hypokalemia: Recurrent and severe. Not sure if patient is taking her potassium supplements while on diuretics. May also be secondary to poor intake in diet. Potassium has corrected. Followup BMP in 5 days. Recommend outpatient endocrinology consultation as deemed necessary. ACTH: 22, CT chest: Negative for malignancy, 24 hour urine potassium excretion: 43 mEq, 5-HIAA-pending, Aldo Straughn and cortisol levels previously were normal. Replete the magnesium. 4. Severe uncontrolled hypertension: As per her cardiologist, she comes to the office with markedly elevated blood pressures which are reasonably  controlled while she is  hospitalized. Again significant concern for compliance to medications. Patient currently on hydralazine, Aldactone, labetalol, losartan and clonidine. DC Imdur. Outpatient close followup. 5. Uncontrolled type II DM with hypoglycemia: Patient indicates that she takes 20 units twice a day of insulin 70/30. She volunteers to hypoglycemic episodes at home. Reduced insulin to 15 units twice a day. This will need to be closely monitored as outpatient and necessary adjustments to be made. A1c 14.4 in March 2014. 6. History of CVA: Continue home dose of aspirin for secondary prevention. 7. Tobacco abuse: Cessation counseled. 8. Transaminitis: Chronic and stable. No documentation of cirrhosis on liver ultrasound. 9. Right renal cyst: Outpatient followup by PCP as deemed necessary.  Procedures:  None   Consultations:  None  Discharge Exam:  Complaints:  Denies dyspnea or chest pain. Body swelling has almost resolved.  Filed Vitals:   08/15/12 1945 08/15/12 2036 08/16/12 0505 08/16/12 0826  BP:  149/48 177/62   Pulse: 67 67 68   Temp:  98.2 F (36.8 C) 98.9 F (37.2 C)   TempSrc:  Oral Oral   Resp: 20 20 18    Height:      Weight:   63.8 kg (140 lb 10.5 oz)   SpO2: 96% 100% 97% 94%     General exam: Comfortable.  Respiratory system: Clear. No increased work of breathing.  Cardiovascular system: S1 and S2 heard, RRR. No JVD, murmurs. Trace ankle edema. Telemetry: Sinus rhythm,? A paced rhythm  Gastrointestinal system: Abdomen is nondistended, soft and nontender. Normal bowel sounds heard.  Central nervous system: Alert and oriented. No focal neurological deficits.  Extremities: Symmetric 5 x 5 power.  Discharge Instructions  Discharge Orders   Future Orders Complete By Expires     (HEART FAILURE PATIENTS) Call MD:  Anytime you have any of the following symptoms: 1) 3 pound weight gain in 24 hours or 5 pounds in 1 week 2) shortness of breath, with or without a dry hacking cough  3) swelling in the hands, feet or stomach 4) if you have to sleep on extra pillows at night in order to breathe.  As directed     Call MD for:  difficulty breathing, headache or visual disturbances  As directed     Call MD for:  As directed     Comments:      Low blood sugars.    Diet - low sodium heart healthy  As directed     Diet Carb Modified  As directed     Discharge instructions  As directed     Comments:      Oxygen via nasal cannula at 2 L per minute, continuously.    Increase activity slowly  As directed         Medication List    STOP taking these medications       metoprolol 100 MG tablet  Commonly known as:  LOPRESSOR      TAKE these medications       acetaminophen-codeine 300-30 MG per tablet  Commonly known as:  TYLENOL #3  Take 0.5 tablets by mouth daily as needed for pain.     ALPRAZolam 0.5 MG tablet  Commonly known as:  XANAX  Take 0.5 mg by mouth daily as needed. For anxiety     aspirin EC 81 MG tablet  Take 81 mg by mouth daily.     cilostazol 100 MG tablet  Commonly known as:  PLETAL  Take 100 mg by mouth  2 (two) times daily.     cloNIDine 0.1 MG tablet  Commonly known as:  CATAPRES  Take 1 tablet (0.1 mg total) by mouth 3 (three) times daily.     furosemide 40 MG tablet  Commonly known as:  LASIX  Take 40 mg by mouth 2 (two) times daily.     hydrALAZINE 100 MG tablet  Commonly known as:  APRESOLINE  Take 1 tablet (100 mg total) by mouth 3 (three) times daily.     insulin NPH-regular (70-30) 100 UNIT/ML injection  Commonly known as:  NOVOLIN 70/30  Inject 15 Units into the skin 2 (two) times daily with a meal.     labetalol 300 MG tablet  Commonly known as:  NORMODYNE  Take 1 tablet (300 mg total) by mouth 3 (three) times daily.     losartan 100 MG tablet  Commonly known as:  COZAAR  Take 1 tablet (100 mg total) by mouth daily.     magnesium chloride 64 MG Tbec  Commonly known as:  SLOW-MAG  Take 1 tablet (64 mg total) by mouth 2  (two) times daily.     metoCLOPramide 5 MG tablet  Commonly known as:  REGLAN  Take 5 mg by mouth 4 (four) times daily.     omega-3 acid ethyl esters 1 G capsule  Commonly known as:  LOVAZA  Take 1 g by mouth 3 (three) times daily.     potassium chloride SA 20 MEQ tablet  Commonly known as:  K-DUR,KLOR-CON  Take 20 mEq by mouth 2 (two) times daily.     rosuvastatin 10 MG tablet  Commonly known as:  CRESTOR  Take 10 mg by mouth daily.     spironolactone 50 MG tablet  Commonly known as:  ALDACTONE  Take 2 tablets (100 mg total) by mouth daily.           Follow-up Information   Follow up with [facility], MD. Schedule an appointment as soon as possible for a visit in 2 weeks.   Contact information:   [address] [facility] [place] [address] [phone]       Follow up with Gwynneth Aliment, MD. Schedule an appointment as soon as possible for a visit in 5 days. (To be seen with repeat labs (BMP, CBC & Mg))    Contact information:   [address] [place] [address] [phone]       Follow up with Pamella Pert, MD. Schedule an appointment as soon as possible for a visit in 1 week.   Contact information:   [address] [place] [address] [phone]        The results of significant diagnostics from this hospitalization (including imaging, microbiology, ancillary and laboratory) are listed below for reference.    Significant Diagnostic Studies: Dg Chest 2 View  08/12/2012   *RADIOLOGY REPORT*  Clinical Data: Edema, swelling, pain and legs when walking. History of hypertension and diabetes.  CHEST - 2 VIEW  Comparison: 11/11/2011  Findings: The heart is mildly enlarged.  Patient has left-sided transvenous pacemaker with leads to the right atrium and right ventricle.  There are bilateral pleural effusions.  Bilateral lower lobe alveolar infiltrates are present, most consistent with infectious process.  Stent identified in the upper mediastinum and upper abdomen.  IMPRESSION: 1.  Mild cardiomegaly. 2.  Bilateral lower lobe infiltrates and pleural effusions.   Original Report Authenticated By: Norva Pavlov, M.D.   Ct  Chest W Contrast  08/12/2012   *RADIOLOGY REPORT*  Clinical Data: Cough and weight loss.  Evaluate for potential malignancy.  CT CHEST WITH CONTRAST  Technique:  Multidetector CT imaging of the chest was performed following the standard protocol during bolus administration of intravenous contrast.  Contrast: 80mL OMNIPAQUE IOHEXOL 300 MG/ML  SOLN  Comparison: No priors.  Findings:  Mediastinum: Heart size is mildly enlarged left ventricular concentric hypertrophy.  There is no significant pericardial fluid, thickening or pericardial calcification.  There is atherosclerosis of the thoracic aorta, the great vessels of the mediastinum and the coronary arteries, including calcified atherosclerotic plaque in the left main, left anterior descending, left circumflex and right coronary arteries. Calcifications of the aortic valve. No pathologically enlarged mediastinal or hilar lymph nodes. Esophagus is unremarkable in appearance.  Left-sided pacemaker in place with lead tips terminating in the right atrial appendage and right ventricular apex.  Lungs/Pleura: Mild diffuse interlobular septal thickening and thickening of the fissures, compatible with mild interstitial pulmonary edema.  Small bilateral pleural effusions.  Areas of passive atelectasis throughout the lower lobes of the lungs bilaterally.  Calcified granuloma in the left upper lobe.  No other definite suspicious-appearing pulmonary nodules or masses are identified.  Upper Abdomen: Extensive atherosclerosis.  Multiple low attenuation renal lesions bilaterally, similar to prior examinations, the largest of which are compatible with cysts, while the smaller lesions remain too small to definitively characterize.  Multifocal parenchymal  calcification throughout the pancreas, compatible with sequelae of chronic pancreatitis.  Musculoskeletal: There are no aggressive appearing lytic or blastic lesions noted in the visualized portions of the skeleton.  IMPRESSION: 1.  No definite suspicious appearing pulmonary nodules or masses identified. 2.  Cardiomegaly with left ventricular concentric hypertrophy and evidence of interstitial pulmonary edema with small bilateral pleural effusions; findings suggestive of congestive heart failure. 3. Atherosclerosis, including left main three-vessel coronary artery disease.  4.  Calcified granuloma in the left upper lobe. 5.  Additional incidental findings, as above, similar to prior examinations.   Original Report Authenticated By: Trudie Reed, M.D.   US Abdomen Complete  08/12/2012   *RADIOLOGY REPORT*  Clinical Data:  Abnormal LFTs.  Question of ascites.  COMPLETE ABDOMINAL ULTRASOUND  Comparison:  04/18/2012  Findings:  Gallbladder:  Gallbladder has a normal appearance.  Gallbladder wall is 2.7 mm, within normal limits.  No stones or pericholecystic fluid.  No sonographic Murphy's sign.  Common bile duct:  6.5 mm  Liver:  A focal hyper echoic lesion is identified within the liver, 1.2 x 1.1 x 1.0 cm.  IVC:  Appears normal.  Pancreas:  The pancreas is not well seen because of overlying bowel gas.  Spleen:  3.7 cm in length, normal in appearance.  Right Kidney:  10.6 cm in length.  Echogenic parenchyma.  Cyst is 2.1 x 1.8 x 1.6 cm.  No hydronephrosis.  Left Kidney:  10.3 cm.  Not well seen.  No focal mass or hydronephrosis identified however.  Abdominal aorta:  Atherosclerotic, 2.0 cm maximum diameter.  IMPRESSION:  1.  No evidence for acute cholecystitis. 2.  Focal hyperechoic lesion within the liver, not seen on prior contrast enhanced CT exams.  It is possible that lesion was not seen on previous studies because of its small size.  Findings favor a benign hemangioma. 3.  Right renal cyst. 4.  No  hydronephrosis.   Original Report Authenticated By: Norva Pavlov, M.D.    Microbiology: No results found for this or any previous visit (from  the past 240 hour(s)).   Labs: Basic Metabolic Panel:  Recent Labs Lab 08/12/12 1521 08/13/12 0500 08/13/12 1641 08/14/12 0354 08/15/12 0432 08/16/12 0530  NA  --  141 136 139 136 139  K  --  2.9* 3.4* 3.1* 4.0 4.2  CL  --  105 100 102 100 101  CO2  --  28 28 29 31 31   GLUCOSE  --  81 450* 95 152* 176*  BUN  --  10 10 11 15 15   CREATININE  --  0.90 0.97 0.91 0.92 1.15*  CALCIUM  --  7.6* 7.7* 8.4 8.6 8.2*  MG 1.5  --   --   --   --   --    Liver Function Tests:  Recent Labs Lab 08/12/12 1521 08/13/12 0500  AST 80* 79*  ALT 73* 71*  ALKPHOS 111 115  BILITOT 0.2* 0.2*  PROT 6.0 5.8*  ALBUMIN 2.6* 2.4*   No results found for this basename: LIPASE, AMYLASE,  in the last 168 hours No results found for this basename: AMMONIA,  in the last 168 hours CBC:  Recent Labs Lab 08/12/12 1112 08/13/12 0500 08/14/12 0354 08/15/12 0432 08/16/12 0530  WBC 3.6* 4.2 4.4 4.5 4.2  HGB 12.7 12.4 12.6 12.7 11.0*  HCT 37.3 36.1 36.5 38.7 32.7*  MCV 100.3* 98.9 98.6 102.1* 100.0  PLT 166 181 187 212 200   Cardiac Enzymes:  Recent Labs Lab 08/12/12 1112  TROPONINI <0.30   BNP: BNP (last 3 results)  Recent Labs  08/12/12 1112  PROBNP 2540.0*   CBG:  Recent Labs Lab 08/15/12 1136 08/15/12 1625 08/15/12 2118 08/16/12 0612 08/16/12 1122  GLUCAP 236* 204* 167* 151* 306*    Additional labs:    Signed:  Birney Belshe  [facility] 08/16/2012, 12:45 PM

## 2012-08-16 NOTE — Progress Notes (Signed)
Inpatient Diabetes Program Recommendations  AACE/ADA: New Consensus Statement on Inpatient Glycemic Control (2013)  Target Ranges:  Prepandial:   less than 140 mg/dL      Peak postprandial:   less than 180 mg/dL (1-2 hours)      Critically ill patients:  140 - 180 mg/dL     Results for Carmen [name], Carmen [name] (MRN [id number]) as of 08/16/2012 12:25  Ref. Range 08/15/2012 06:27 08/15/2012 11:36 08/15/2012 16:25 08/15/2012 21:18  Glucose-Capillary Latest Range: 70-99 mg/dL 409 (H) 811 (H) 914 (H) 167 (H)    Results for Carmen [name], Carmen [name] (MRN [id number]) as of 08/16/2012 12:25  Ref. Range 08/16/2012 06:12 08/16/2012 11:22  Glucose-Capillary Latest Range: 70-99 mg/dL 086 (H) 578 (H)    Patient with hyperglycemia.  Per records, patient takes 70/30 insulin at home- 20 units bid with meals  Recommend the following: MD- Please consider starting 1/2 patient's home dose of 70/30 insulin- 10 units bid with breakfast and supper    Will follow. Ambrose Finland RN, MSN, CDE Diabetes Coordinator Inpatient Diabetes Program [phone]

## 2012-08-16 NOTE — Care Management Note (Addendum)
  Page 2 of 2   08/16/2012     2:25:17 PM   CARE MANAGEMENT NOTE 08/16/2012  Patient:  Carmen [name], Carmen [name]   Account Number:  [id number]  Date Initiated:  08/16/2012  Documentation initiated by:  [facility]  Subjective/Objective Assessment:   79 yo admitted w/hypokalemia;   progressive edema of her lower extremities, upper extremities, face and increased abdominal girth.     Action/Plan:   replace potassium, diurese/ NCM to set up Home Health RN for dz management and telehealth   Anticipated DC Date:  08/16/2012   Anticipated DC Plan:  HOME W HOME HEALTH SERVICES      DC Planning Services  CM consult      [facility] Choice  HOME HEALTH   Choice offered to / List presented to:  [name]        HH arranged  HH-1 RN  HH-10 DISEASE MANAGEMENT      HH agency  [facility]   Status of service:  Completed, signed off Medicare Important Message given?   (If response is "NO", the following Medicare IM given date fields will be blank) Date Medicare IM given:   Date Additional Medicare IM given:    Discharge Disposition:    Per UR Regulation:    If discussed at Long Length of Stay Meetings, dates discussed:    Comments:  08/16/2012 @ 1330.Marland KitchenMarland KitchenOletta Cohn, RN, BSN, [employer] [phone] Spoke with pt at bedside regarding discharge planning. Offered pt list of home health agencies to choose from.  Pt chose [facility] to render services of RN for disease management and teleheatlh.  Carmen [name] of [facility] notified. Home [employer]2 DME needs identified at this time and ordered.

## 2012-08-18 LAB — 5 HIAA, QUANTITATIVE, URINE, 24 HOUR: Volume, Urine-5HIAA: 2100 mL/24 h

## 2012-09-01 ENCOUNTER — Encounter (HOSPITAL_COMMUNITY): Payer: Self-pay | Admitting: Emergency Medicine

## 2012-09-01 ENCOUNTER — Emergency Department (HOSPITAL_COMMUNITY)
Admission: EM | Admit: 2012-09-01 | Discharge: 2012-09-01 | Disposition: A | Discharge status: Home / Self Care | Payer: Medicare Other | Attending: Emergency Medicine | Admitting: Emergency Medicine

## 2012-09-01 DIAGNOSIS — I150 Renovascular hypertension: Secondary | ICD-10-CM | POA: Insufficient documentation

## 2012-09-01 DIAGNOSIS — Z950 Presence of cardiac pacemaker: Secondary | ICD-10-CM | POA: Insufficient documentation

## 2012-09-01 DIAGNOSIS — Z8739 Personal history of other diseases of the musculoskeletal system and connective tissue: Secondary | ICD-10-CM | POA: Insufficient documentation

## 2012-09-01 DIAGNOSIS — Z8669 Personal history of other diseases of the nervous system and sense organs: Secondary | ICD-10-CM | POA: Insufficient documentation

## 2012-09-01 DIAGNOSIS — I2510 Atherosclerotic heart disease of native coronary artery without angina pectoris: Secondary | ICD-10-CM | POA: Insufficient documentation

## 2012-09-01 DIAGNOSIS — Z8673 Personal history of transient ischemic attack (TIA), and cerebral infarction without residual deficits: Secondary | ICD-10-CM | POA: Insufficient documentation

## 2012-09-01 DIAGNOSIS — Z7982 Long term (current) use of aspirin: Secondary | ICD-10-CM | POA: Insufficient documentation

## 2012-09-01 DIAGNOSIS — Z8679 Personal history of other diseases of the circulatory system: Secondary | ICD-10-CM | POA: Insufficient documentation

## 2012-09-01 DIAGNOSIS — E162 Hypoglycemia, unspecified: Secondary | ICD-10-CM

## 2012-09-01 DIAGNOSIS — F411 Generalized anxiety disorder: Secondary | ICD-10-CM | POA: Insufficient documentation

## 2012-09-01 DIAGNOSIS — F17200 Nicotine dependence, unspecified, uncomplicated: Secondary | ICD-10-CM | POA: Insufficient documentation

## 2012-09-01 DIAGNOSIS — E1169 Type 2 diabetes mellitus with other specified complication: Secondary | ICD-10-CM | POA: Insufficient documentation

## 2012-09-01 DIAGNOSIS — Z79899 Other long term (current) drug therapy: Secondary | ICD-10-CM | POA: Insufficient documentation

## 2012-09-01 DIAGNOSIS — I10 Essential (primary) hypertension: Secondary | ICD-10-CM | POA: Insufficient documentation

## 2012-09-01 DIAGNOSIS — Z794 Long term (current) use of insulin: Secondary | ICD-10-CM | POA: Insufficient documentation

## 2012-09-01 LAB — GLUCOSE, CAPILLARY
Glucose-Capillary: 70 mg/dL (ref 70–99)
Glucose-Capillary: 237 mg/dL — ABNORMAL HIGH (ref 70–99)

## 2012-09-01 LAB — CBC WITH DIFFERENTIAL/PLATELET
Basophils Absolute: 0 10*3/uL (ref 0.0–0.1)
Eosinophils Absolute: 0.1 10*3/uL (ref 0.0–0.7)
Lymphocytes Relative: 18 % (ref 12–46)
Lymphs Abs: 0.9 10*3/uL (ref 0.7–4.0)
Neutrophils Relative %: 77 % (ref 43–77)
Platelets: 153 10*3/uL (ref 150–400)
RBC: 4.37 MIL/uL (ref 3.87–5.11)
WBC: 5.1 10*3/uL (ref 4.0–10.5)

## 2012-09-01 LAB — BASIC METABOLIC PANEL
Calcium: 8.6 mg/dL (ref 8.4–10.5)
GFR calc non Af Amer: 39 mL/min — ABNORMAL LOW (ref >90)
Sodium: 143 mEq/L (ref 135–145)

## 2012-09-01 MED ORDER — CLONIDINE HCL 0.1 MG PO TABS
0.1000 mg | ORAL_TABLET | Freq: Once | ORAL | Status: AC
  Administered 2012-09-01: 0.1 mg via ORAL
  Filled 2012-09-01: qty 1

## 2012-09-01 MED ORDER — HYDRALAZINE HCL 50 MG PO TABS
100.0000 mg | ORAL_TABLET | ORAL | Status: AC
  Administered 2012-09-01: 100 mg via ORAL
  Filled 2012-09-01: qty 2

## 2012-09-01 MED ORDER — POTASSIUM CHLORIDE CRYS ER 20 MEQ PO TBCR
40.0000 meq | EXTENDED_RELEASE_TABLET | Freq: Once | ORAL | Status: AC
  Administered 2012-09-01: 40 meq via ORAL
  Filled 2012-09-01: qty 2

## 2012-09-01 NOTE — ED Notes (Signed)
Pt comes from home with initial c/o of hypoglycemia, pt cbg was 25, was given D50 and cbg came up to 140. Pt also was hypertensive 240/100. Pt has had episodes of hypogylcemia before, when asked to draw up her insulin for EMS pt was drawing up wrong amt of units due to inability to see well. Pt alert and oriented at this time. Pt states she has had all of her medications this morning.

## 2012-09-01 NOTE — ED Notes (Signed)
Discharge instructions reviewed with pt and family. All verbalized understanding.

## 2012-09-01 NOTE — ED Provider Notes (Signed)
History    CSN: [id number] Arrival date & time 09/01/12  1318  First MD Initiated Contact with Patient 09/01/12 1324     Chief Complaint  Patient presents with  . Hypertension    The history is provided by the patient.   Hypoglycemia  Onset - earlier today Course - worsening Improved by - D50 and fluids Worsened by - nothing   Pt presents from home for hypoglycemia and hypertension Apparently EMS was called due to confusion and her glucose was noted to be 25.  She was given D50 and she improved.  Her glucose increased to 140.   She reports she feels well.   She reports chronic right UE weakness from previous stroke but no other complaints at this time She took her morning insulin (15 units of novolin per chart)   Past Medical History  Diagnosis Date  . Diabetes mellitus   . Hypertension   . Coronary artery disease   . Renal disorder   . Herniated lumbar intervertebral disc   . Cataract     cataract surgery scheduled for 03/31/11, 2 - left eye, 1 - right eye  . Renovascular hypertension      s/p left renal artery stent 12/2007.  S/P balloon angioplasty on 02/16/10 for ISR, BP was  controlled well since then.     . Claudication in peripheral vascular disease     02/16/10: Left CIA 9.0x28 Omnilink and REIA 8.0x40 seff expanding Zilver.   right subclavian artery stent 03/18/2008,  . S/P carotid endarterectomy      left carotid endarterectomy  1991; Stroke and TIA in 1999 with right sided weakness, now with residual right arm weakness  . Stroke     TIA  . Peripheral vascular disease   . Anxiety    Past Surgical History  Procedure Laterality Date  . Appendectomy    . Abdominal hysterectomy    . Ureteral stent placement    . Carotid endarterectomy    . Laminectomy    . Back surgery    . Colonoscopy  07/30/2011    Procedure: COLONOSCOPY;  Surgeon: Louis Meckel, MD;  Location: [facility] ENDOSCOPY;  Service: Endoscopy;  Laterality: N/A;  gi bleed  .  Esophagogastroduodenoscopy  07/30/2011    Procedure: ESOPHAGOGASTRODUODENOSCOPY (EGD);  Surgeon: Louis Meckel, MD;  Location: [facility] ENDOSCOPY;  Service: Endoscopy;  Laterality: N/A;  . Pacemaker insertion     Family History  Problem Relation Age of Onset  . Cancer Father    History  Substance Use Topics  . Smoking status: Current Every Day Smoker -- 0.50 packs/day for 65 years    Types: Cigarettes  . Smokeless tobacco: Never Used  . Alcohol Use: No   OB History   Grav Para Term Preterm Abortions TAB SAB Ect Mult Living                 Review of Systems  Constitutional: Negative for fever.  Respiratory: Negative for shortness of breath.   Cardiovascular: Negative for chest pain.  Gastrointestinal: Negative for vomiting.  Genitourinary: Negative for dysuria.  Neurological: Negative for weakness.  All other systems reviewed and are negative.    Allergies  Norvasc; Lasix; and Peanut-containing drug products  Home Medications   Current Outpatient Rx  Name  Route  Sig  Dispense  Refill  . acetaminophen-codeine (TYLENOL #3) 300-30 MG per tablet   Oral   Take 0.5 tablets by mouth daily as needed for pain.         Marland Kitchen  ALPRAZolam (XANAX) 0.5 MG tablet   Oral   Take 0.5 mg by mouth daily as needed. For anxiety         . aspirin EC 81 MG tablet   Oral   Take 81 mg by mouth daily.         . cilostazol (PLETAL) 100 MG tablet   Oral   Take 100 mg by mouth 2 (two) times daily.         . cloNIDine (CATAPRES) 0.1 MG tablet   Oral   Take 1 tablet (0.1 mg total) by mouth 3 (three) times daily.   90 tablet   0   . furosemide (LASIX) 40 MG tablet   Oral   Take 40 mg by mouth 2 (two) times daily.         . hydrALAZINE (APRESOLINE) 100 MG tablet   Oral   Take 1 tablet (100 mg total) by mouth 3 (three) times daily.   90 tablet   0   . insulin NPH-regular (NOVOLIN 70/30) (70-30) 100 UNIT/ML injection   Subcutaneous   Inject 15 Units into the skin 2 (two) times  daily with a meal.         . labetalol (NORMODYNE) 300 MG tablet   Oral   Take 1 tablet (300 mg total) by mouth 3 (three) times daily.   90 tablet   0   . losartan (COZAAR) 100 MG tablet   Oral   Take 1 tablet (100 mg total) by mouth daily.   30 tablet   0   . magnesium chloride (SLOW-MAG) 64 MG TBEC   Oral   Take 1 tablet (64 mg total) by mouth 2 (two) times daily.   10 tablet   0   . metoCLOPramide (REGLAN) 5 MG tablet   Oral   Take 5 mg by mouth 4 (four) times daily.         Marland Kitchen omega-3 acid ethyl esters (LOVAZA) 1 G capsule   Oral   Take 1 g by mouth 3 (three) times daily.          . potassium chloride SA (K-DUR,KLOR-CON) 20 MEQ tablet   Oral   Take 20 mEq by mouth 2 (two) times daily.         . rosuvastatin (CRESTOR) 10 MG tablet   Oral   Take 10 mg by mouth daily.         Marland Kitchen spironolactone (ALDACTONE) 50 MG tablet   Oral   Take 2 tablets (100 mg total) by mouth daily.   60 tablet   0    BP 215/84  Pulse 61  SpO2 100% Physical Exam CONSTITUTIONAL: Well developed/well nourished HEAD: Normocephalic/atraumatic EYES: EOMI/PERRL ENMT: Mucous membranes moist NECK: supple no meningeal signs SPINE:entire spine nontender CV: S1/S2 noted, no murmurs/rubs/gallops noted LUNGS: Lungs are clear to auscultation bilaterally, no apparent distress ABDOMEN: soft, nontender, no rebound or guarding GU:no cva tenderness NEURO: Pt is awake/alert, no distress. She has chronic right UE paresis that is not new EXTREMITIES: pulses normal, full ROM SKIN: warm, color normal PSYCH: no abnormalities of mood noted  ED Course  Procedures  Labs Reviewed  CBC WITH DIFFERENTIAL  GLUCOSE, CAPILLARY  BASIC METABOLIC PANEL  3:20 PM Pt with hypoglycemia and this has occurred previously per chart.  She also has h/o uncontrolled HTN Apparently EMS noted that patient may be drawing up incorrect amt of insulin at home She is currently stable 4:19 PM Pt improving Family  reports  she will have difficulty reading insulin bottle.  They will start to help her with dosing to ensure correct dosing Pt is due her HTN meds, there were ordered She is on novolin and this was taken this morning  Pt appears improved and stable for d/c I have asked her to hold her insulin for tonight and go back to 15units BID tomorrow   MDM  Nursing notes including past medical history and social history reviewed and considered in documentation Labs/vital reviewed and considered Previous records reviewed and considered - recent d/c summary reviewed, pt with h/o hypoglycemia and HTN     Date: 09/01/2012  Rate: 60  Rhythm: paced rhythm  QRS Axis: normal  Intervals: normal  ST/T Wave abnormalities: nonspecific ST changes  Conduction Disutrbances:none  Narrative Interpretation:   Old EKG Reviewed: unchanged         Joya Gaskins, MD 09/01/12 1658

## 2012-09-22 ENCOUNTER — Ambulatory Visit: Payer: Medicare Other | Admitting: Endocrinology

## 2012-11-30 ENCOUNTER — Emergency Department (HOSPITAL_COMMUNITY): Payer: Medicare Other

## 2012-11-30 ENCOUNTER — Inpatient Hospital Stay (HOSPITAL_COMMUNITY)
Admission: EM | Admit: 2012-11-30 | Discharge: 2012-12-12 | DRG: 208 | Disposition: A | Discharge status: Skilled Nursing Facility | Payer: Medicare Other | Attending: Internal Medicine | Admitting: Internal Medicine

## 2012-11-30 ENCOUNTER — Inpatient Hospital Stay (HOSPITAL_COMMUNITY): Payer: Medicare Other

## 2012-11-30 DIAGNOSIS — N289 Disorder of kidney and ureter, unspecified: Secondary | ICD-10-CM

## 2012-11-30 DIAGNOSIS — I639 Cerebral infarction, unspecified: Secondary | ICD-10-CM

## 2012-11-30 DIAGNOSIS — I10 Essential (primary) hypertension: Secondary | ICD-10-CM

## 2012-11-30 DIAGNOSIS — E876 Hypokalemia: Secondary | ICD-10-CM

## 2012-11-30 DIAGNOSIS — I129 Hypertensive chronic kidney disease with stage 1 through stage 4 chronic kidney disease, or unspecified chronic kidney disease: Secondary | ICD-10-CM | POA: Diagnosis present

## 2012-11-30 DIAGNOSIS — I6350 Cerebral infarction due to unspecified occlusion or stenosis of unspecified cerebral artery: Secondary | ICD-10-CM

## 2012-11-30 DIAGNOSIS — E872 Acidosis: Secondary | ICD-10-CM

## 2012-11-30 DIAGNOSIS — IMO0002 Reserved for concepts with insufficient information to code with codable children: Secondary | ICD-10-CM

## 2012-11-30 DIAGNOSIS — E119 Type 2 diabetes mellitus without complications: Secondary | ICD-10-CM | POA: Diagnosis present

## 2012-11-30 DIAGNOSIS — I798 Other disorders of arteries, arterioles and capillaries in diseases classified elsewhere: Secondary | ICD-10-CM | POA: Diagnosis present

## 2012-11-30 DIAGNOSIS — Z8673 Personal history of transient ischemic attack (TIA), and cerebral infarction without residual deficits: Secondary | ICD-10-CM

## 2012-11-30 DIAGNOSIS — E875 Hyperkalemia: Secondary | ICD-10-CM | POA: Diagnosis not present

## 2012-11-30 DIAGNOSIS — I509 Heart failure, unspecified: Secondary | ICD-10-CM

## 2012-11-30 DIAGNOSIS — E871 Hypo-osmolality and hyponatremia: Secondary | ICD-10-CM | POA: Diagnosis not present

## 2012-11-30 DIAGNOSIS — I739 Peripheral vascular disease, unspecified: Secondary | ICD-10-CM | POA: Diagnosis present

## 2012-11-30 DIAGNOSIS — F17200 Nicotine dependence, unspecified, uncomplicated: Secondary | ICD-10-CM | POA: Diagnosis present

## 2012-11-30 DIAGNOSIS — I69959 Hemiplegia and hemiparesis following unspecified cerebrovascular disease affecting unspecified side: Secondary | ICD-10-CM

## 2012-11-30 DIAGNOSIS — N390 Urinary tract infection, site not specified: Secondary | ICD-10-CM

## 2012-11-30 DIAGNOSIS — N189 Chronic kidney disease, unspecified: Secondary | ICD-10-CM | POA: Diagnosis present

## 2012-11-30 DIAGNOSIS — G9340 Encephalopathy, unspecified: Secondary | ICD-10-CM

## 2012-11-30 DIAGNOSIS — I161 Hypertensive emergency: Secondary | ICD-10-CM

## 2012-11-30 DIAGNOSIS — J4489 Other specified chronic obstructive pulmonary disease: Secondary | ICD-10-CM | POA: Diagnosis present

## 2012-11-30 DIAGNOSIS — G629 Polyneuropathy, unspecified: Secondary | ICD-10-CM | POA: Diagnosis present

## 2012-11-30 DIAGNOSIS — R531 Weakness: Secondary | ICD-10-CM

## 2012-11-30 DIAGNOSIS — E1159 Type 2 diabetes mellitus with other circulatory complications: Secondary | ICD-10-CM | POA: Diagnosis present

## 2012-11-30 DIAGNOSIS — E1151 Type 2 diabetes mellitus with diabetic peripheral angiopathy without gangrene: Secondary | ICD-10-CM

## 2012-11-30 DIAGNOSIS — R1310 Dysphagia, unspecified: Secondary | ICD-10-CM | POA: Diagnosis present

## 2012-11-30 DIAGNOSIS — N179 Acute kidney failure, unspecified: Secondary | ICD-10-CM

## 2012-11-30 DIAGNOSIS — I2510 Atherosclerotic heart disease of native coronary artery without angina pectoris: Secondary | ICD-10-CM | POA: Diagnosis present

## 2012-11-30 DIAGNOSIS — J449 Chronic obstructive pulmonary disease, unspecified: Secondary | ICD-10-CM | POA: Diagnosis present

## 2012-11-30 DIAGNOSIS — E870 Hyperosmolality and hypernatremia: Secondary | ICD-10-CM

## 2012-11-30 DIAGNOSIS — J9600 Acute respiratory failure, unspecified whether with hypoxia or hypercapnia: Principal | ICD-10-CM

## 2012-11-30 DIAGNOSIS — I5033 Acute on chronic diastolic (congestive) heart failure: Secondary | ICD-10-CM

## 2012-11-30 DIAGNOSIS — E1100 Type 2 diabetes mellitus with hyperosmolarity without nonketotic hyperglycemic-hyperosmolar coma (NKHHC): Secondary | ICD-10-CM

## 2012-11-30 DIAGNOSIS — E162 Hypoglycemia, unspecified: Secondary | ICD-10-CM

## 2012-11-30 DIAGNOSIS — G609 Hereditary and idiopathic neuropathy, unspecified: Secondary | ICD-10-CM | POA: Diagnosis present

## 2012-11-30 DIAGNOSIS — E1165 Type 2 diabetes mellitus with hyperglycemia: Secondary | ICD-10-CM | POA: Diagnosis present

## 2012-11-30 DIAGNOSIS — J9612 Chronic respiratory failure with hypercapnia: Secondary | ICD-10-CM

## 2012-11-30 DIAGNOSIS — Z950 Presence of cardiac pacemaker: Secondary | ICD-10-CM

## 2012-11-30 DIAGNOSIS — E43 Unspecified severe protein-calorie malnutrition: Secondary | ICD-10-CM

## 2012-11-30 LAB — POCT I-STAT 3, ART BLOOD GAS (G3+)
Acid-Base Excess: 7 mmol/L — ABNORMAL HIGH (ref 0.0–2.0)
Acid-Base Excess: 8 mmol/L — ABNORMAL HIGH (ref 0.0–2.0)
Bicarbonate: 35.1 mEq/L — ABNORMAL HIGH (ref 20.0–24.0)
Bicarbonate: 31.6 mEq/L — ABNORMAL HIGH (ref 20.0–24.0)
O2 Saturation: 100 %
O2 Saturation: 96 %
Patient temperature: 98
TCO2: 34 mmol/L (ref 0–100)
TCO2: 34 mmol/L (ref 0–100)
TCO2: 37 mmol/L (ref 0–100)
pCO2 arterial: 44.5 mmHg (ref 35.0–45.0)
pCO2 arterial: 67.1 mmHg (ref 35.0–45.0)
pH, Arterial: 7.33 — ABNORMAL LOW (ref 7.350–7.450)
pH, Arterial: 7.28 — ABNORMAL LOW (ref 7.350–7.450)
pO2, Arterial: 313 mmHg — ABNORMAL HIGH (ref 80.0–100.0)
pO2, Arterial: 63 mmHg — ABNORMAL LOW (ref 80.0–100.0)

## 2012-11-30 LAB — COMPREHENSIVE METABOLIC PANEL
ALT: 72 U/L — ABNORMAL HIGH (ref 0–35)
AST: 55 U/L — ABNORMAL HIGH (ref 0–37)
BUN: 16 mg/dL (ref 6–23)
CO2: 32 mEq/L (ref 19–32)
Chloride: 106 mEq/L (ref 96–112)
Creatinine, Ser: 1.2 mg/dL — ABNORMAL HIGH (ref 0.50–1.10)
GFR calc Af Amer: 49 mL/min — ABNORMAL LOW (ref >90)
GFR calc non Af Amer: 42 mL/min — ABNORMAL LOW (ref >90)
Glucose, Bld: 44 mg/dL — CL (ref 70–99)
Total Bilirubin: 0.2 mg/dL — ABNORMAL LOW (ref 0.3–1.2)
Total Protein: 6.7 g/dL (ref 6.0–8.3)

## 2012-11-30 LAB — TROPONIN I
Troponin I: <0.3 ng/mL (ref <0.30)
Troponin I: <0.3 ng/mL (ref <0.30)

## 2012-11-30 LAB — GLUCOSE, CAPILLARY
Glucose-Capillary: 104 mg/dL — ABNORMAL HIGH (ref 70–99)
Glucose-Capillary: 125 mg/dL — ABNORMAL HIGH (ref 70–99)
Glucose-Capillary: 213 mg/dL — ABNORMAL HIGH (ref 70–99)
Glucose-Capillary: 41 mg/dL — CL (ref 70–99)

## 2012-11-30 LAB — URINALYSIS, ROUTINE W REFLEX MICROSCOPIC
Bilirubin Urine: NEGATIVE
Leukocytes, UA: NEGATIVE
Protein, ur: >300 mg/dL — AB
Urobilinogen, UA: 0.2 mg/dL (ref 0.0–1.0)
pH: 6 (ref 5.0–8.0)

## 2012-11-30 LAB — CBC WITH DIFFERENTIAL/PLATELET
Basophils Absolute: 0 10*3/uL (ref 0.0–0.1)
HCT: 42.3 % (ref 36.0–46.0)
Hemoglobin: 14.9 g/dL (ref 12.0–15.0)
Lymphocytes Relative: 37 % (ref 12–46)
Lymphs Abs: 2 10*3/uL (ref 0.7–4.0)
Monocytes Absolute: 0.2 10*3/uL (ref 0.1–1.0)
Monocytes Relative: 3 % (ref 3–12)
Neutro Abs: 3 10*3/uL (ref 1.7–7.7)
RBC: 4.37 MIL/uL (ref 3.87–5.11)
RDW: 15.1 % (ref 11.5–15.5)
WBC: 5.3 10*3/uL (ref 4.0–10.5)

## 2012-11-30 LAB — BASIC METABOLIC PANEL
BUN: 19 mg/dL (ref 6–23)
Creatinine, Ser: 1.25 mg/dL — ABNORMAL HIGH (ref 0.50–1.10)
GFR calc Af Amer: 47 mL/min — ABNORMAL LOW (ref >90)
GFR calc non Af Amer: 40 mL/min — ABNORMAL LOW (ref >90)
Potassium: 3.3 mEq/L — ABNORMAL LOW (ref 3.5–5.1)

## 2012-11-30 LAB — URINE MICROSCOPIC-ADD ON

## 2012-11-30 MED ORDER — ASPIRIN EC 81 MG PO TBEC
81.0000 mg | DELAYED_RELEASE_TABLET | Freq: Every day | ORAL | Status: DC
  Administered 2012-12-01 – 2012-12-12 (×11): 81 mg via ORAL
  Filled 2012-11-30 (×12): qty 1

## 2012-11-30 MED ORDER — ONDANSETRON HCL 4 MG/2ML IJ SOLN
4.0000 mg | Freq: Four times a day (QID) | INTRAMUSCULAR | Status: DC | PRN
  Filled 2012-11-30 (×2): qty 2

## 2012-11-30 MED ORDER — NITROGLYCERIN IN D5W 200-5 MCG/ML-% IV SOLN: 2.0000 ug/min | INTRAVENOUS | Status: DC

## 2012-11-30 MED ORDER — METOPROLOL TARTRATE 1 MG/ML IV SOLN
5.0000 mg | Freq: Four times a day (QID) | INTRAVENOUS | Status: DC
  Filled 2012-11-30: qty 5

## 2012-11-30 MED ORDER — FUROSEMIDE 10 MG/ML IJ SOLN: 80.0000 mg | Freq: Three times a day (TID) | INTRAMUSCULAR | Status: DC

## 2012-11-30 MED ORDER — LOSARTAN POTASSIUM 50 MG PO TABS
100.0000 mg | ORAL_TABLET | Freq: Every day | ORAL | Status: DC
  Administered 2012-12-01 – 2012-12-12 (×11): 100 mg via ORAL
  Filled 2012-11-30 (×13): qty 2

## 2012-11-30 MED ORDER — CLONIDINE HCL 0.2 MG PO TABS
0.2000 mg | ORAL_TABLET | Freq: Two times a day (BID) | ORAL | Status: DC
  Administered 2012-11-30 – 2012-12-12 (×23): 0.2 mg via ORAL
  Filled 2012-11-30 (×27): qty 1

## 2012-11-30 MED ORDER — POTASSIUM CHLORIDE 10 MEQ/100ML IV SOLN
10.0000 meq | INTRAVENOUS | Status: AC
  Administered 2012-11-30 (×3): 10 meq via INTRAVENOUS
  Filled 2012-11-30: qty 100

## 2012-11-30 MED ORDER — POTASSIUM CHLORIDE 20 MEQ/15ML (10%) PO LIQD
40.0000 meq | Freq: Once | ORAL | Status: AC
  Administered 2012-11-30: 40 meq via ORAL
  Filled 2012-11-30: qty 30

## 2012-11-30 MED ORDER — SODIUM CHLORIDE 0.9 % IV SOLN
25.0000 ug/h | INTRAVENOUS | Status: DC
  Administered 2012-11-30: 50 ug/h via INTRAVENOUS
  Filled 2012-11-30: qty 50

## 2012-11-30 MED ORDER — NITROGLYCERIN 2 % TD OINT
1.0000 [in_us] | TOPICAL_OINTMENT | Freq: Four times a day (QID) | TRANSDERMAL | Status: DC
  Filled 2012-11-30: qty 1

## 2012-11-30 MED ORDER — NITROGLYCERIN IN D5W 200-5 MCG/ML-% IV SOLN
2.0000 ug/min | INTRAVENOUS | Status: DC
  Administered 2012-11-30 – 2012-12-01 (×2): 10 ug/min via INTRAVENOUS
  Administered 2012-12-01: 5 ug/min via INTRAVENOUS
  Filled 2012-11-30: qty 250

## 2012-11-30 MED ORDER — CHLORHEXIDINE GLUCONATE 0.12 % MT SOLN
15.0000 mL | Freq: Two times a day (BID) | OROMUCOSAL | Status: DC
  Administered 2012-12-01 – 2012-12-03 (×6): 15 mL via OROMUCOSAL
  Filled 2012-11-30 (×6): qty 15

## 2012-11-30 MED ORDER — SODIUM CHLORIDE 0.9 % IJ SOLN: 3.0000 mL | INTRAMUSCULAR | Status: DC | PRN

## 2012-11-30 MED ORDER — POTASSIUM CHLORIDE 20 MEQ/15ML (10%) PO LIQD
40.0000 meq | Freq: Two times a day (BID) | ORAL | Status: DC
  Administered 2012-12-01 (×2): 40 meq via ORAL
  Filled 2012-11-30 (×3): qty 30

## 2012-11-30 MED ORDER — METOPROLOL TARTRATE 50 MG PO TABS
50.0000 mg | ORAL_TABLET | Freq: Two times a day (BID) | ORAL | Status: DC
  Filled 2012-11-30: qty 1

## 2012-11-30 MED ORDER — METOPROLOL TARTRATE 100 MG PO TABS
100.0000 mg | ORAL_TABLET | Freq: Two times a day (BID) | ORAL | Status: DC
  Filled 2012-11-30: qty 1

## 2012-11-30 MED ORDER — FENTANYL CITRATE 0.05 MG/ML IJ SOLN
50.0000 ug | Freq: Once | INTRAMUSCULAR | Status: AC
  Administered 2012-11-30: 50 ug via INTRAVENOUS

## 2012-11-30 MED ORDER — FUROSEMIDE 10 MG/ML IJ SOLN: 60.0000 mg | Freq: Four times a day (QID) | INTRAMUSCULAR | Status: DC

## 2012-11-30 MED ORDER — PANTOPRAZOLE SODIUM 40 MG IV SOLR
40.0000 mg | Freq: Every day | INTRAVENOUS | Status: DC
  Administered 2012-12-01 – 2012-12-06 (×7): 40 mg via INTRAVENOUS
  Filled 2012-11-30 (×8): qty 40

## 2012-11-30 MED ORDER — POTASSIUM CHLORIDE CRYS ER 20 MEQ PO TBCR: 40.0000 meq | EXTENDED_RELEASE_TABLET | Freq: Two times a day (BID) | ORAL | Status: DC

## 2012-11-30 MED ORDER — FENTANYL CITRATE 0.05 MG/ML IJ SOLN
INTRAMUSCULAR | Status: AC
  Administered 2012-11-30: 50 ug via INTRAVENOUS
  Filled 2012-11-30: qty 2

## 2012-11-30 MED ORDER — SODIUM CHLORIDE 0.9 % IJ SOLN
3.0000 mL | Freq: Two times a day (BID) | INTRAMUSCULAR | Status: DC
  Administered 2012-12-01 – 2012-12-12 (×18): 3 mL via INTRAVENOUS

## 2012-11-30 MED ORDER — DEXTROSE 50 % IV SOLN
1.0000 | Freq: Once | INTRAVENOUS | Status: AC
  Administered 2012-11-30: 50 mL via INTRAVENOUS
  Filled 2012-11-30: qty 50

## 2012-11-30 MED ORDER — ACETAMINOPHEN 325 MG PO TABS: 650.0000 mg | ORAL_TABLET | Freq: Four times a day (QID) | ORAL | Status: DC | PRN

## 2012-11-30 MED ORDER — NITROGLYCERIN 2 % TD OINT
1.0000 [in_us] | TOPICAL_OINTMENT | Freq: Four times a day (QID) | TRANSDERMAL | Status: DC
  Administered 2012-11-30: 1 [in_us] via TOPICAL
  Filled 2012-11-30: qty 1

## 2012-11-30 MED ORDER — FENTANYL BOLUS VIA INFUSION
25.0000 ug | Freq: Four times a day (QID) | INTRAVENOUS | Status: DC | PRN
  Filled 2012-11-30: qty 100

## 2012-11-30 MED ORDER — FUROSEMIDE 10 MG/ML IJ SOLN: 40.0000 mg | Freq: Four times a day (QID) | INTRAMUSCULAR | Status: DC

## 2012-11-30 MED ORDER — METOPROLOL TARTRATE 25 MG/10 ML ORAL SUSPENSION
50.0000 mg | Freq: Two times a day (BID) | ORAL | Status: DC
  Administered 2012-12-01 – 2012-12-03 (×5): 50 mg via ORAL
  Filled 2012-11-30 (×7): qty 20

## 2012-11-30 MED ORDER — MIDAZOLAM HCL 2 MG/2ML IJ SOLN: 1.0000 mg | INTRAMUSCULAR | Status: DC | PRN

## 2012-11-30 MED ORDER — ENOXAPARIN SODIUM 30 MG/0.3ML ~~LOC~~ SOLN
30.0000 mg | SUBCUTANEOUS | Status: DC
  Administered 2012-12-01 – 2012-12-12 (×12): 30 mg via SUBCUTANEOUS
  Filled 2012-11-30 (×12): qty 0.3

## 2012-11-30 MED ORDER — POTASSIUM CHLORIDE 10 MEQ/100ML IV SOLN
10.0000 meq | INTRAVENOUS | Status: AC
  Administered 2012-11-30 (×2): 10 meq via INTRAVENOUS
  Filled 2012-11-30 (×4): qty 100

## 2012-11-30 MED ORDER — ALBUTEROL SULFATE HFA 108 (90 BASE) MCG/ACT IN AERS
6.0000 | INHALATION_SPRAY | RESPIRATORY_TRACT | Status: DC | PRN
  Filled 2012-11-30: qty 6.7

## 2012-11-30 MED ORDER — INSULIN ASPART 100 UNIT/ML ~~LOC~~ SOLN
0.0000 [IU] | SUBCUTANEOUS | Status: DC
  Administered 2012-12-01: 1 [IU] via SUBCUTANEOUS
  Administered 2012-12-01 – 2012-12-02 (×3): 2 [IU] via SUBCUTANEOUS
  Administered 2012-12-02: 5 [IU] via SUBCUTANEOUS
  Administered 2012-12-02: 1 [IU] via SUBCUTANEOUS
  Administered 2012-12-02 (×2): 3 [IU] via SUBCUTANEOUS
  Administered 2012-12-03: 2 [IU] via SUBCUTANEOUS
  Administered 2012-12-03: 1 [IU] via SUBCUTANEOUS
  Administered 2012-12-03: 3 [IU] via SUBCUTANEOUS
  Administered 2012-12-03 (×3): 2 [IU] via SUBCUTANEOUS
  Administered 2012-12-04: 1 [IU] via SUBCUTANEOUS
  Administered 2012-12-04: 2 [IU] via SUBCUTANEOUS
  Administered 2012-12-04: 1 [IU] via SUBCUTANEOUS
  Administered 2012-12-04: 17:00:00 via SUBCUTANEOUS
  Administered 2012-12-04: 1 [IU] via SUBCUTANEOUS
  Administered 2012-12-04: 3 [IU] via SUBCUTANEOUS
  Administered 2012-12-05 (×2): 2 [IU] via SUBCUTANEOUS
  Administered 2012-12-05 (×2): 9 [IU] via SUBCUTANEOUS
  Administered 2012-12-05: 3 [IU] via SUBCUTANEOUS
  Administered 2012-12-05: 7 [IU] via SUBCUTANEOUS
  Administered 2012-12-05: 1 [IU] via SUBCUTANEOUS
  Administered 2012-12-06: 9 [IU] via SUBCUTANEOUS
  Administered 2012-12-06 (×2): 2 [IU] via SUBCUTANEOUS

## 2012-11-30 MED ORDER — BIOTENE DRY MOUTH MT LIQD
15.0000 mL | Freq: Four times a day (QID) | OROMUCOSAL | Status: DC
  Administered 2012-12-01 – 2012-12-03 (×11): 15 mL via OROMUCOSAL

## 2012-11-30 MED ORDER — FUROSEMIDE 10 MG/ML IJ SOLN
80.0000 mg | Freq: Once | INTRAMUSCULAR | Status: AC
  Administered 2012-11-30: 80 mg via INTRAVENOUS
  Filled 2012-11-30: qty 8

## 2012-11-30 NOTE — ED Notes (Signed)
Did in and out cath on patient dark urine in return 
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
## 2012-11-30 NOTE — Progress Notes (Signed)
Unit CM UR Completed by [facility] ED CM  W. Arian Murley RN  

## 2012-11-30 NOTE — Progress Notes (Signed)
This NP was paged by Dr. Jomarie Longs, [facility], at shift change to check on pt and see where she needed to be admitted because RNs had voiced concern over pt being too complicated and sick for SDU. NP to bedside in the ED. Pt is mostly unresponsive at this point and per RNs and Dr. Romeo Apple (EDP), her blood gas has worsened even with Bipap. EDP to intubate pt. This NP called [facility] and spoke to Dr. Vassie Loll. With permission, pt will now be admitted to [facility]00 ICU and PCCM will do official consult and take over care of pt. Family here and this NP explained situation and they are understandably upset. Support given. ED staff to call chaplain. Jimmye Norman, NP [facility]

## 2012-11-30 NOTE — ED Notes (Signed)
Per EMS pt from home where family states pt was attempting to get out of bed and fell.  Denies any LOC.  Later today the pt began to co back pain and SOB.   EMS states lungs are clear  Pt denies being confused she only states she is having to think more.  Pt hx of stroke with deficits on rt side.  22 left ac.  212/108 pt on 4l home o2 and ems continued.  70hr sinus rythum

## 2012-11-30 NOTE — Progress Notes (Signed)
Patient was transported from ED to [facility] without any complications.  Vitals are normal and sats are 98%.  RT will continue to monitor.

## 2012-11-30 NOTE — ED Notes (Signed)
Report to [facility] in ICU. Family at bedside.

## 2012-11-30 NOTE — Progress Notes (Signed)
Chaplain responded to a request by the ED to provide pastoral support to the very emotional family of patient who medical status declined  rapidly since her arrival to the hospital.  At the time of my all arrival to the patients room she had been put of the ventilator to help stabilize her medical condition.  The family was very emotional because theyt have had recent deaths, and other family crisis going on.  Chaplain offered encouragement and spiritual support. Chaplain escorted the family to 2H Unit and informed Nursing Staff of their presence.  Chaplain will provide on-going follow-up on the patient's status, and refer to the assigned Unit Chaplain. Chaplain Janell Quiet [phone]

## 2012-11-30 NOTE — ED Notes (Signed)
Reported to Dr. Jomarie Longs pt. Status, she is out of the hospital and to call floor coverage.

## 2012-11-30 NOTE — ED Provider Notes (Signed)
7:46 PM Pt having declining MS, difficult to arouse. ABG worsening on bipap. Will intubate. Family made aware and at bedside.  Easily intubated w/ 7.5 ETT, using glidescope.   INTUBATION Performed by: Purvis Sheffield, S  Required items: required blood products, implants, devices, and special equipment available Patient identity confirmed: provided demographic data and hospital-assigned identification number Time out: Immediately prior to procedure a "time out" was called to verify the correct patient, procedure, equipment, support staff and site/side marked as required.  Indications: Declining level of alertness. Worsening ABG despite use of bipap for several hours.   Intubation method: Glidescope Laryngoscopy   Preoxygenation: Bipap, BVM  Sedatives: Etomidate Paralytic: Succinylcholine  Tube Size: 7.5 cuffed  Post-procedure assessment: chest rise and ETCO2 monitor Breath sounds: equal and absent over the epigastrium Tube secured with: ETT holder Chest x-ray interpreted by radiologist and me.  Chest x-ray findings: endotracheal tube in appropriate position  Patient tolerated the procedure well with no immediate complications.     Junius Argyle, MD 12/01/12 1302

## 2012-11-30 NOTE — H&P (Addendum)
[facility] History and Physical  Carmen [name] HYQ:[id number] DOB: November 24, 1933 DOA: 11/30/2012  Referring physician: EDP PCP: Carmen Aliment, MD  Specialists: Cards Carmen [name]  Chief Complaint: multiple complaints  HPI: Carmen [name] is a 79 y.o. female with PMH of Chronic diastolic CHF, DM, Poorly controlled HTN is brought to the ER by her grand daughter today who she has been staying with since Thursday.Marland Kitchen She was reportedly in he usual state of health till earlier this am, her granddaughter heard a loud thud sound this am and found her fallen down, but awake, she denied passing out or any complaints then other than aches all over. Then as the day progressed she was noted to be breathing fast and gasping for air later this am and somewhat drowsy. Subsequently EMS was called her CBG was 44 and was given D50. Upon evaluation in the ER, noted to have BP 217/107, RR 30, K of 2.7, [place] of [address], CXR with acute CHF changes. Grand daughter reports somewhat compliance with medications, she is not on a low sodium diet.  Review of Systems: The patient denies anorexia, fever, weight loss,, vision loss, decreased hearing, hoarseness, chest pain, syncope, dyspnea on exertion, peripheral edema, balance deficits, hemoptysis, abdominal pain, melena, hematochezia, severe indigestion/heartburn, hematuria, incontinence, genital sores, muscle weakness, suspicious skin lesions, transient blindness, difficulty walking, depression, unusual weight change, abnormal bleeding, enlarged lymph nodes, angioedema, and breast masses.    Past Medical History  Diagnosis Date  . Diabetes mellitus   . Hypertension   . Coronary artery disease   . Renal disorder   . Herniated lumbar intervertebral disc   . Cataract     cataract surgery scheduled for 03/31/11, 2 - left eye, 1 - right eye  . Renovascular hypertension      s/p left renal artery stent 12/2007.  S/P balloon angioplasty on 02/16/10 for ISR, BP was   controlled well since then.     . Claudication in peripheral vascular disease     02/16/10: Left CIA 9.0x28 Omnilink and REIA 8.0x40 seff expanding Zilver.   right subclavian artery stent 03/18/2008,  . S/P carotid endarterectomy      left carotid endarterectomy  1991; Stroke and TIA in 1999 with right sided weakness, now with residual right arm weakness  . Stroke     TIA  . Peripheral vascular disease   . Anxiety    Past Surgical History  Procedure Laterality Date  . Appendectomy    . Abdominal hysterectomy    . Ureteral stent placement    . Carotid endarterectomy    . Laminectomy    . Back surgery    . Colonoscopy  07/30/2011    Procedure: COLONOSCOPY;  Surgeon: Carmen Meckel, MD;  Location: [facility] ENDOSCOPY;  Service: Endoscopy;  Laterality: N/A;  gi bleed  . Esophagogastroduodenoscopy  07/30/2011    Procedure: ESOPHAGOGASTRODUODENOSCOPY (EGD);  Surgeon: Carmen Meckel, MD;  Location: [facility] ENDOSCOPY;  Service: Endoscopy;  Laterality: N/A;  . Pacemaker insertion     Social History:  reports that she has been smoking Cigarettes.  She has a 32.5 pack-year smoking history. She has never used smokeless tobacco. She reports that she does not drink alcohol or use illicit drugs. Lives at home with spouse and son, requires assistance with some ADLs  Allergies  Allergen Reactions  . Norvasc [Amlodipine Besylate] Swelling  . Lasix [Furosemide]     Caused a problem with her kidneys. However, pt is taking daily at home.  . Peanut-Containing  Drug Products Other (See Comments)    Cause my stomach to hurt    Family History  Problem Relation Age of Onset  . Cancer Father     Prior to Admission medications   Medication Sig Start Date End Date Taking? Authorizing Provider  acetaminophen-codeine (TYLENOL #3) 300-30 MG per tablet Take 0.5 tablets by mouth daily as needed for pain.   Yes Historical Provider, MD  aspirin EC 81 MG tablet Take 81 mg by mouth daily.   Yes Historical Provider, MD   furosemide (LASIX) 40 MG tablet Take 20 mg by mouth daily.   Yes Historical Provider, MD  insulin NPH-regular (NOVOLIN 70/30) (70-30) 100 UNIT/ML injection Inject 15 Units into the skin 2 (two) times daily with a meal. 08/16/12  Yes Carmen Etienne, MD  labetalol (NORMODYNE) 200 MG tablet Take 200 mg by mouth 2 (two) times daily.   Yes Historical Provider, MD  losartan (COZAAR) 100 MG tablet Take 1 tablet (100 mg total) by mouth daily. 08/16/12  Yes Carmen Etienne, MD  metoprolol (LOPRESSOR) 100 MG tablet Take 100 mg by mouth 2 (two) times daily.   Yes Historical Provider, MD  omega-3 acid ethyl esters (LOVAZA) 1 G capsule Take 1 g by mouth 3 (three) times daily.    Yes Historical Provider, MD   Physical Exam: Filed Vitals:   11/30/12 1715  BP: 201/97  Pulse: 61  Temp:   Resp: 28     General: lethargic, arousible, in mild to moderate resp distress  HEENt: PERRLA, oral mucosa moist and pink, JVD noted  Cardiovascular:S1S2/RRR  Respiratory: Diminished BS at bases  Abdomen: soft, NT, ND, BS present  Skin: dry and scaly  Musculoskeletal: trace edema, no c/c  Psychiatric: mood difficult to assess  Neurologic: RUE weakness  Labs on Admission:  Basic Metabolic Panel:  Recent Labs Lab 11/30/12 1509  NA 145  K 2.7*  CL 106  CO2 32  GLUCOSE 44*  BUN 16  CREATININE 1.20*  CALCIUM 8.6   Liver Function Tests:  Recent Labs Lab 11/30/12 1509  AST 55*  ALT 72*  ALKPHOS 129*  BILITOT 0.2*  PROT 6.7  ALBUMIN 2.8*   No results found for this basename: LIPASE, AMYLASE,  in the last 168 hours No results found for this basename: AMMONIA,  in the last 168 hours CBC:  Recent Labs Lab 11/30/12 1509  WBC 5.3  NEUTROABS 3.0  HGB 14.9  HCT 42.3  MCV 96.8  PLT 178   Cardiac Enzymes:  Recent Labs Lab 11/30/12 1503  TROPONINI <0.30    BNP (last 3 results)  Recent Labs  08/12/12 1112 11/30/12 1509  PROBNP 2540.0* 10923.0*   CBG:  Recent Labs Lab  11/30/12 1658  GLUCAP 104*    Radiological Exams on Admission: Dg Chest Portable 1 View  11/30/2012   CLINICAL DATA:  Shortness of breath. Smoker.  EXAM: PORTABLE CHEST - 1 VIEW  COMPARISON:  Previous examinations.  FINDINGS: The cardiac silhouette remains at mildly enlarged. The pulmonary vasculature and interstitial markings are prominent with mild progression of the interstitial prominence since 08/12/2012. Small left pleural effusion. Stable left subclavian pacemaker leads. Diffuse osteopenia and right shoulder degenerative changes.  IMPRESSION: Mild changes of acute congestive heart failure superimposed on chronic interstitial lung disease.   Electronically Signed   By: Gordan Payment   On: 11/30/2012 15:49    EKG: Independently reviewed. LVH, Non specific T wave changes Assessment/Plan  1. Acute Resp Failure -due to  Pulm edema/Accelerated HTN -BIPAP, Nitro gtt, Lasix -Admit to SDU  2. Acute Diastolic CHF -suspect medication and dietary non complaince -start Nitro gtt, lasix 80mh Q8 -continue metoprolol -BIPAP as noted above -check TRoponin Q6 -last ECHO EF of 50-55% with moderate MR and TR -Carmen [name] consulted  3. Accelerated HTN - Nitro gtt titrate for SBP<160/110 - continue Metoprolol, add clonidine BID  4. DM -with hypoglycemia, earlier today -Hold Insulin 70/30, use SSI for now  5. H/o CVA -continue ASA 81mg   6. Hypokalemia -replace IV and PO -repeat in am  DVT proph: lovenox  Code Status: Full Family Communication:d/w granddaughter at bedside Disposition Plan: Admit to SDU  Time spent:  [facility] Pager [phone]  If 7PM-7AM, please contact night-coverage [email or web address] Password [facility] 11/30/2012, 5:33 PM

## 2012-11-30 NOTE — ED Notes (Signed)
Carmen [name] at the bedside,  Pt. Has become decrease LOC.  Only opens eyes to sternal rub.

## 2012-11-30 NOTE — ED Notes (Signed)
Dr. Jomarie Longs at the bedside talking with pt. And family and updating them on pt.s status

## 2012-11-30 NOTE — Procedures (Signed)
Intubation Procedure Note Carmen [name] [id number] 11-05-1933  Procedure: Intubation Indications: Airway protection and maintenance  Procedure Details Consent: Unable to obtain consent because of altered level of consciousness. Time Out: Verified patient identification, verified procedure, site/side was marked, verified correct patient position, special equipment/implants available, medications/allergies/relevent history reviewed, required imaging and test results available.  Performed  Maximum sterile technique was used including cap, gloves, gown and mask.  MAC and 3    Evaluation Hemodynamic Status: Transient hypertension requiring treatment; O2 sats: stable throughout Patient's Current Condition: stable Complications: No apparent complications Patient did tolerate procedure well. Chest X-ray ordered to verify placement.  CXR: pending  Patient intubated for airway protection.  Has had BP ranging in the 200s since admission.  Patient was not a difficult intubation.  O2 sats were stable.  Procedure made in one attempt.  Patient has several secretions.  RT will continue to monitor.[name], [name] 11/30/2012

## 2012-11-30 NOTE — ED Notes (Signed)
RT at the bedside placing pt. On Bipap

## 2012-11-30 NOTE — ED Notes (Signed)
Placed a 16 french foley into patient dark urine in return

## 2012-11-30 NOTE — ED Notes (Signed)
Soft stool cleaned; pt tolerated movement poorly.  C/O left shoulder pain.

## 2012-11-30 NOTE — Procedures (Signed)
Central Venous Catheter Insertion Procedure Note Carmen [name] [id number] 11-Jun-1933  Procedure: Insertion of Central Venous Catheter Indications: Assessment of intravascular volume and Drug and/or fluid administration  Procedure Details Consent: Risks of procedure as well as the alternatives and risks of each were explained to the (patient/caregiver).  Consent for procedure obtained. Time Out: Verified patient identification, verified procedure, site/side was marked, verified correct patient position, special equipment/implants available, medications/allergies/relevent history reviewed, required imaging and test results available.  Performed  Maximum sterile technique was used including antiseptics, cap, gloves, gown, hand hygiene, mask and sheet. Skin prep: Chlorhexidine; local anesthetic administered A antimicrobial bonded/coated triple lumen catheter was placed in the right internal jugular vein using the Seldinger technique.  Ultrasound was used to verify the patency of the vein and for real time needle guidance.  Evaluation Blood flow good Complications: No apparent complications Patient did tolerate procedure well. Chest X-ray ordered to verify placement.  CXR: pending.  MCQUAID, DOUGLAS 11/30/2012, 10:23 PM

## 2012-11-30 NOTE — ED Notes (Signed)
Reported critical lab values to Dr. Devoria Albe, Potassium 2.7 and Glucose 44

## 2012-11-30 NOTE — ED Notes (Signed)
Assumed pt care from [place].  Pt to be intubated.  Family aware, very tearful.

## 2012-11-30 NOTE — Progress Notes (Signed)
Jewelry given to husband, Kyira Volkert. Green beaded bracelet with hand charm, copper colored link bracelet, yellow rubber bracelet "[employer]", silver colored heart and x bracelet.

## 2012-11-30 NOTE — ED Notes (Signed)
Updated Dr. Jomarie Longs on pt.s status.

## 2012-11-30 NOTE — Consult Note (Signed)
PULMONARY  / CRITICAL CARE MEDICINE  Name: Carmen [name] MRN: [id number] DOB: 02-Aug-1933    ADMISSION DATE:  11/30/2012 CONSULTATION DATE:  11/30/2012  REFERRING MD :  Joya Martyr PRIMARY SERVICE: PCCM  CHIEF COMPLAINT:  Hypertensive   BRIEF PATIENT DESCRIPTION: 3 y/ female with hypertension and DM2 was admitted on 9/25 with hypertensive emergency causing pulmonary edema and acute respiratory failure.  SIGNIFICANT EVENTS / STUDIES:  9/25 admission  LINES / TUBES: 9/25 ETT >> 9/25 R IJ CVL >>  CULTURES:   ANTIBIOTICS:   HISTORY OF PRESENT ILLNESS:  62 y/ female with hypertension and DM2 was admitted on 9/25 with hypertensive emergency causing pulmonary edema and acute respiratory failure.  Her family provides history because she was intubated when I took a history.  Her granddaughter heard her fall today when leaving bathroom.  When she found her she was weak but responsive.  They brought her to the ER where she was found to be markedly hypertensive.  In the ED she developed worsening dyspnea and hypoxemia and eventually needed intubation.  PAST MEDICAL HISTORY :  Past Medical History  Diagnosis Date  . Diabetes mellitus   . Hypertension   . Coronary artery disease   . Renal disorder   . Herniated lumbar intervertebral disc   . Cataract     cataract surgery scheduled for 03/31/11, 2 - left eye, 1 - right eye  . Renovascular hypertension      s/p left renal artery stent 12/2007.  S/P balloon angioplasty on 02/16/10 for ISR, BP was  controlled well since then.     . Claudication in peripheral vascular disease     02/16/10: Left CIA 9.0x28 Omnilink and REIA 8.0x40 seff expanding Zilver.   right subclavian artery stent 03/18/2008,  . S/P carotid endarterectomy      left carotid endarterectomy  1991; Stroke and TIA in 1999 with right sided weakness, now with residual right arm weakness  . Stroke     TIA  . Peripheral vascular disease   . Anxiety    Past Surgical History   Procedure Laterality Date  . Appendectomy    . Abdominal hysterectomy    . Ureteral stent placement    . Carotid endarterectomy    . Laminectomy    . Back surgery    . Colonoscopy  07/30/2011    Procedure: COLONOSCOPY;  Surgeon: Louis Meckel, MD;  Location: [facility] ENDOSCOPY;  Service: Endoscopy;  Laterality: N/A;  gi bleed  . Esophagogastroduodenoscopy  07/30/2011    Procedure: ESOPHAGOGASTRODUODENOSCOPY (EGD);  Surgeon: Louis Meckel, MD;  Location: [facility] ENDOSCOPY;  Service: Endoscopy;  Laterality: N/A;  . Pacemaker insertion     Prior to Admission medications   Medication Sig Start Date End Date Taking? Authorizing Provider  acetaminophen-codeine (TYLENOL #3) 300-30 MG per tablet Take 0.5 tablets by mouth daily as needed for pain.   Yes Historical Provider, MD  aspirin EC 81 MG tablet Take 81 mg by mouth daily.   Yes Historical Provider, MD  furosemide (LASIX) 40 MG tablet Take 20 mg by mouth daily.   Yes Historical Provider, MD  insulin NPH-regular (NOVOLIN 70/30) (70-30) 100 UNIT/ML injection Inject 15 Units into the skin 2 (two) times daily with a meal. 08/16/12  Yes Elease Etienne, MD  labetalol (NORMODYNE) 200 MG tablet Take 200 mg by mouth 2 (two) times daily.   Yes Historical Provider, MD  losartan (COZAAR) 100 MG tablet Take 1 tablet (100 mg total) by mouth daily.  08/16/12  Yes Elease Etienne, MD  metoprolol (LOPRESSOR) 100 MG tablet Take 100 mg by mouth 2 (two) times daily.   Yes Historical Provider, MD  omega-3 acid ethyl esters (LOVAZA) 1 G capsule Take 1 g by mouth 3 (three) times daily.    Yes Historical Provider, MD   Allergies  Allergen Reactions  . Norvasc [Amlodipine Besylate] Swelling  . Lasix [Furosemide]     Caused a problem with her kidneys. However, pt is taking daily at home.  . Peanut-Containing Drug Products Other (See Comments)    Cause my stomach to hurt    FAMILY HISTORY/SOCIAL HISTORY/REVIEW OF SYSTEMS:  Cannot obtain due to intubation  SUBJECTIVE:    VITAL SIGNS: Temp:  [98 F (36.7 C)] 98 F (36.7 C) (09/25 1435) Pulse Rate:  [60-79] 65 (09/25 2030) Resp:  [13-47] 14 (09/25 2030) BP: (191-249)/(80-145) 203/103 mmHg (09/25 2030) SpO2:  [86 %-100 %] 100 % (09/25 2030) FiO2 (%):  [100 %] 100 % (09/25 2112) Weight:  [53.2 kg (117 lb 4.6 oz)] 53.2 kg (117 lb 4.6 oz) (09/25 2112) HEMODYNAMICS:   VENTILATOR SETTINGS: Vent Mode:  [-] PRVC FiO2 (%):  [100 %] 100 % Set Rate:  [18 bmp] 18 bmp Vt Set:  [480 mL] 480 mL PEEP:  [5 cmH20] 5 cmH20 Plateau Pressure:  [12 cmH20-15 cmH20] 15 cmH20 INTAKE / OUTPUT: Intake/Output   None     PHYSICAL EXAMINATION:  Gen: sedated on vent HEENT: NCAT, PERRL ETT in place PULM: loud crackles bilaterally CV: RRR, no mgr, no JVD AB: BS+, soft, nontender, no hsm Ext: warm, trace edema, no clubbing, no cyanosis Derm: no rash or skin breakdown Neuro: sedated on vent    LABS:  CBC Recent Labs     11/30/12  1509  WBC  5.3  HGB  14.9  HCT  42.3  PLT  178   Coag's No results found for this basename: APTT, INR,  in the last 72 hours BMET Recent Labs     11/30/12  1509  NA  145  K  2.7*  CL  106  CO2  32  BUN  16  CREATININE  1.20*  GLUCOSE  44*   Electrolytes Recent Labs     11/30/12  1509  CALCIUM  8.6   Sepsis Markers No results found for this basename: LACTICACIDVEN, PROCALCITON, O2SATVEN,  in the last 72 hours ABG Recent Labs     11/30/12  1605  11/30/12  1939  PHART  7.330*  7.280*  PCO2ART  66.5*  67.1*  PO2ART  63.0*  95.0   Liver Enzymes Recent Labs     11/30/12  1509  AST  55*  ALT  72*  ALKPHOS  129*  BILITOT  0.2*  ALBUMIN  2.8*   Cardiac Enzymes Recent Labs     11/30/12  1503  11/30/12  1509  11/30/12  1818  TROPONINI  <0.30   --   <0.30  PROBNP   --   10923.0*   --    Glucose Recent Labs     11/30/12  1658  11/30/12  1836  11/30/12  1856  11/30/12  1923  GLUCAP  104*  41*  213*  125*    Imaging Dg Chest Port 1  View  11/30/2012   CLINICAL DATA:  Intubated.  EXAM: PORTABLE CHEST - 1 VIEW  COMPARISON:  Earlier today.  FINDINGS: Interval endotracheal tube with its tip 4.6 cm above the carina. Interval nasogastric tube  extending into the stomach. The cardiac silhouette remains enlarged. Increased prominence of the interstitial markings with some alveolar opacities bilaterally. Stable left subclavian pacemaker leads. Stable dense opacity in the left lower lobe. Diffuse osteopenia and right shoulder degenerative changes.  IMPRESSION: 1. Progressive changes of acute congestive heart failure. 2. Stable dense left lower lobe atelectasis or pneumonia.   Electronically Signed   By: Gordan Payment   On: 11/30/2012 20:42   Dg Chest Portable 1 View  11/30/2012   CLINICAL DATA:  Shortness of breath. Smoker.  EXAM: PORTABLE CHEST - 1 VIEW  COMPARISON:  Previous examinations.  FINDINGS: The cardiac silhouette remains at mildly enlarged. The pulmonary vasculature and interstitial markings are prominent with mild progression of the interstitial prominence since 08/12/2012. Small left pleural effusion. Stable left subclavian pacemaker leads. Diffuse osteopenia and right shoulder degenerative changes.  IMPRESSION: Mild changes of acute congestive heart failure superimposed on chronic interstitial lung disease.   Electronically Signed   By: Gordan Payment   On: 11/30/2012 15:49     CXR: bilateral pulmonary edema, ETT in place, pacer  ASSESSMENT / PLAN:  PULMONARY A: Acute hypercarbic and hypoxemic respiratory failure P:   -full vent support -abg now -daily cxr/wua/sbt -prn albuterol -see cards  CARDIOVASCULAR A: Hypertensive emergency > initial MAP 150; goal MAP first 24 hours is 112 P:  -start sedation now, then use nitroglycerin gtt to titrate to goal MAP  112 for first 24 hours -continue metoprolol 1/2 home dose -continue clonidine -f/u cardiology recs  RENAL A:  S/p renal artery stenting in past Hypokalemia,  uncertain how much replacement given in ED P:   -consider repeat renal ultrasound with doppler -monitor UOP -stat repeat BMET now, then replete K as needed  GASTROINTESTINAL A:  No acute issues P:   -OG tube -Pantoprazole for stress ulcer prophylaxis  HEMATOLOGIC A:  No acute issues P:  -monitor for bleeding  INFECTIOUS A:  No acute issues P:   -monitor for fever  ENDOCRINE A: DM2 P:   -SSI  NEUROLOGIC A:  Sedation needs for vent synchrony P:   -fentanyl gtt and prn versed titrated to RASS -1  TODAY'S SUMMARY:   Family updated at bedside Code status: Full  I have personally obtained a history, examined the patient, evaluated laboratory and imaging results, formulated the assessment and plan and placed orders. CRITICAL CARE: The patient is critically ill with multiple organ systems failure and requires high complexity decision making for assessment and support, frequent evaluation and titration of therapies, application of advanced monitoring technologies and extensive interpretation of multiple databases. Critical Care Time devoted to patient care services described in this note is 90 minutes.   Fonnie Jarvis Pulmonary and Critical Care Medicine [facility] Pager: [phone]  11/30/2012, 10:24 PM

## 2012-11-30 NOTE — Consult Note (Signed)
CARDIOLOGY CONSULT NOTE  Patient ID: Carmen [name] MRN: [id number] DOB/AGE: April 23, 1933 79 y.o.  Admit date: 11/30/2012 Referring Physician Primary Physician:  Gwynneth Aliment, MD Reason for Consultation   Pulmonary edema and hypertensive crisis   HPI: Carmen [name] is a 79 y.o. female with PMH of Chronic diastolic CHF, DM, Poorly controlled HTN is brought to the ER by her grand daughter today who she has been staying with since Thursday.Marland Kitchen  She was reportedly in he usual state of health till earlier this am, her granddaughter heard a loud thud sound this am and found her fallen down, but awake, she denied passing out or any complaints then other than aches all over.  Then as the day progressed she was noted to be breathing fast and gasping for air later this am and somewhat drowsy.  Subsequently EMS was called her CBG was 44 and was given D50.  Upon evaluation in the ER, noted to have BP 217/107, RR 30, K of 2.7[place] [address], CXR with acute CHF changes. The history is obtained by chart review as patient is very drowsy and lethargic and unable to give me any history. I have known the patient for many years. Past Medical History  Diagnosis Date  . Diabetes mellitus   . Hypertension   . Coronary artery disease   . Renal disorder   . Herniated lumbar intervertebral disc   . Cataract     cataract surgery scheduled for 03/31/11, 2 - left eye, 1 - right eye  . Renovascular hypertension      s/p left renal artery stent 12/2007.  S/P balloon angioplasty on 02/16/10 for ISR, BP was  controlled well since then.     . Claudication in peripheral vascular disease     02/16/10: Left CIA 9.0x28 Omnilink and REIA 8.0x40 seff expanding Zilver.   right subclavian artery stent 03/18/2008,  . S/P carotid endarterectomy      left carotid endarterectomy  1991; Stroke and TIA in 1999 with right sided weakness, now with residual right arm weakness  . Stroke     TIA  . Peripheral vascular disease   . Anxiety       Past Surgical History  Procedure Laterality Date  . Appendectomy    . Abdominal hysterectomy    . Ureteral stent placement    . Carotid endarterectomy    . Laminectomy    . Back surgery    . Colonoscopy  07/30/2011    Procedure: COLONOSCOPY;  Surgeon: Louis Meckel, MD;  Location[facility] ENDOSCOPY;  Service: Endoscopy;  Laterality: N/A;  gi bleed  . Esophagogastroduodenoscopy  07/30/2011    Procedure: ESOPHAGOGASTRODUODENOSCOPY (EGD);  Surgeon: Louis Meckel, MD;  Location: [facility] ENDOSCOPY;  Service: Endoscopy;  Laterality: N/A;  . Pacemaker insertion       Family History  Problem Relation Age of Onset  . Cancer Father      Social History: History   Social History  . Marital Status: Married    Spouse Name: N/A    Number of Children: N/A  . Years of Education: N/A   Occupational History  . Not on file.   Social History Main Topics  . Smoking status: Current Every Day Smoker -- 0.50 packs/day for 65 years    Types: Cigarettes  . Smokeless tobacco: Never Used  . Alcohol Use: No  . Drug Use: No  . Sexual Activity: Not on file   Other Topics Concern  . Not on file  Social History Narrative  . No narrative on file      (Not in a hospital admission)  Scheduled Meds: . cloNIDine  0.2 mg Oral BID   Continuous Infusions: . nitroGLYCERIN 20 mcg/min (11/30/12 1848)  . potassium chloride 10 mEq (11/30/12 1813)  . potassium chloride 10 mEq (11/30/12 1717)   PRN Meds:.  ROS: Unable to be obtained.  Physical Exam: Blood pressure 237/145, pulse 79, temperature 98 F (36.7 C), temperature source Oral, resp. rate 25, SpO2 89.00%.   General appearance: fatigued, moderate distress, slowed mentation and Unable to communicate except nodding yes and no Lungs: wheezes bilaterally Heart: regular rate and rhythm, S1, S2 normal, no murmur, click, rub or gallop distant heart sounds. S4 gallop present  Abdomen: soft, non-tender; bowel sounds normal; no masses,  no  organomegaly Extremities: extremities normal, atraumatic, no cyanosis or edema Neurologic: Patient is lethargic, unable to follow commands at this point, but does not a few but does not localize pain  Labs:   Lab Results  Component Value Date   WBC 5.3 11/30/2012   HGB 14.9 11/30/2012   HCT 42.3 11/30/2012   MCV 96.8 11/30/2012   PLT 178 11/30/2012    Recent Labs Lab 11/30/12 1509  NA 145  K 2.7*  CL 106  CO2 32  BUN 16  CREATININE 1.20*  CALCIUM 8.6  PROT 6.7  BILITOT 0.2*  ALKPHOS 129*  ALT 72*  AST 55*  GLUCOSE 44*   Lab Results  Component Value Date   CKTOTAL 130 03/06/2012   CKMB 5.0* 03/04/2012   TROPONINI <0.30 11/30/2012    Lipid Panel     Component Value Date/Time   CHOL 149 05/29/2012 0657   TRIG 59 05/29/2012 0657   HDL 87 05/29/2012 0657   CHOLHDL 1.7 05/29/2012 0657   VLDL 12 05/29/2012 0657   LDLCALC 50 05/29/2012 0657    EKG: Normal sinus rhythm with a short obese woman, atrial abnormality, and the entry to the position of the, exclude lateral wall ischemia. No significant change compared to prior EKG. ST depression is minimally more prominent compared to the EKG done on 09/03/2012.    Radiology: Dg Chest Portable 1 View  11/30/2012   CLINICAL DATA:  Shortness of breath. Smoker.  EXAM: PORTABLE CHEST - 1 VIEW  COMPARISON:  Previous examinations.  FINDINGS: The cardiac silhouette remains at mildly enlarged. The pulmonary vasculature and interstitial markings are prominent with mild progression of the interstitial prominence since 08/12/2012. Small left pleural effusion. Stable left subclavian pacemaker leads. Diffuse osteopenia and right shoulder degenerative changes.  IMPRESSION: Mild changes of acute congestive heart failure superimposed on chronic interstitial lung disease.   Electronically Signed   By: Gordan Payment   On: 11/30/2012 15:49    ASSESSMENT AND PLAN:  1. Hypertensive emergency with altered mental status and acute pulmonary edema 2. Chronic  renal insufficiency 3. Sick sinus syndrome, history of pacemaker implant. 4. COPD 5. Severe peripheral arterial disease, history of stenting of the bilateral iliac arteries. 6. Diabetes mellitus with the current hypoglycemia, episodes of hypoglycemia 7. Dietary and medication noncompliance  Recommendation: I have started the patient on IV nitroglycerin, also start her on IV beta blocker and we will continue Catapres patch. We will see how she responds to the blood pressure, titrated to blood pressure down slowly, may need use of IV Cardizem. I check her cardiac markers to quantify her overall illness and for prognostication only. Patient is extremely ill and is not a  candidate for any aggressive invasive approach. I have discussed my plans with Dr. Melene Plan. She will need to be aggressive potassium replacement. I will check her magnesium level in the morning.  Pamella Pert, MD 11/30/2012, 7:20 PM [facility] Pager: [phone] Office: [phone] If no answer Cell [phone]

## 2012-11-30 NOTE — ED Notes (Signed)
Spoke with Dr. Jomarie Longs left her know that STepdown unit is not able to care for pt.  Pt. Will need to go to ICU with a titrable drug.

## 2012-12-01 ENCOUNTER — Inpatient Hospital Stay (HOSPITAL_COMMUNITY): Payer: Medicare Other

## 2012-12-01 DIAGNOSIS — G9340 Encephalopathy, unspecified: Secondary | ICD-10-CM

## 2012-12-01 LAB — CBC
HCT: 38.1 % (ref 36.0–46.0)
Hemoglobin: 13.4 g/dL (ref 12.0–15.0)
MCH: 33.6 pg (ref 26.0–34.0)
MCHC: 35.2 g/dL (ref 30.0–36.0)
Platelets: 150 10*3/uL (ref 150–400)

## 2012-12-01 LAB — GLUCOSE, CAPILLARY
Glucose-Capillary: 50 mg/dL — ABNORMAL LOW (ref 70–99)
Glucose-Capillary: 103 mg/dL — ABNORMAL HIGH (ref 70–99)
Glucose-Capillary: 97 mg/dL (ref 70–99)
Glucose-Capillary: 158 mg/dL — ABNORMAL HIGH (ref 70–99)
Glucose-Capillary: 135 mg/dL — ABNORMAL HIGH (ref 70–99)
Glucose-Capillary: 120 mg/dL — ABNORMAL HIGH (ref 70–99)

## 2012-12-01 LAB — BASIC METABOLIC PANEL
BUN: 22 mg/dL (ref 6–23)
Calcium: 7.7 mg/dL — ABNORMAL LOW (ref 8.4–10.5)
GFR calc non Af Amer: 39 mL/min — ABNORMAL LOW (ref >90)
Glucose, Bld: 102 mg/dL — ABNORMAL HIGH (ref 70–99)
Sodium: 145 mEq/L (ref 135–145)

## 2012-12-01 MED ORDER — FUROSEMIDE 10 MG/ML IJ SOLN
40.0000 mg | Freq: Three times a day (TID) | INTRAMUSCULAR | Status: AC
  Administered 2012-12-01 (×2): 40 mg via INTRAVENOUS
  Filled 2012-12-01 (×2): qty 4

## 2012-12-01 MED ORDER — POTASSIUM CHLORIDE 20 MEQ/15ML (10%) PO LIQD
40.0000 meq | Freq: Three times a day (TID) | ORAL | Status: AC
  Administered 2012-12-01: 40 meq
  Filled 2012-12-01 (×2): qty 30

## 2012-12-01 MED ORDER — DEXTROSE 50 % IV SOLN
25.0000 mL | Freq: Once | INTRAVENOUS | Status: AC | PRN
  Administered 2012-12-01: 25 mL via INTRAVENOUS

## 2012-12-01 MED ORDER — GLUCERNA 1.2 CAL PO LIQD
1000.0000 mL | ORAL | Status: DC
  Administered 2012-12-01: 20 mL/h
  Administered 2012-12-02: 1000 mL
  Filled 2012-12-01 (×5): qty 1000

## 2012-12-01 MED ORDER — SODIUM CHLORIDE 0.9 % IV SOLN
INTRAVENOUS | Status: DC
  Administered 2012-12-01: 02:00:00 via INTRAVENOUS
  Administered 2012-12-05: 20 mL/h via INTRAVENOUS

## 2012-12-01 MED ORDER — DEXTROSE 50 % IV SOLN
INTRAVENOUS | Status: AC
  Administered 2012-12-01: 25 mL via INTRAVENOUS
  Filled 2012-12-01: qty 50

## 2012-12-01 MED ORDER — MAGNESIUM SULFATE 40 MG/ML IJ SOLN
2.0000 g | Freq: Once | INTRAMUSCULAR | Status: AC
  Administered 2012-12-01: 2 g via INTRAVENOUS
  Filled 2012-12-01: qty 50

## 2012-12-01 MED ORDER — POTASSIUM CHLORIDE 20 MEQ/15ML (10%) PO LIQD
ORAL | Status: AC
  Administered 2012-12-01: 40 meq via ORAL
  Filled 2012-12-01: qty 30

## 2012-12-01 MED ORDER — SODIUM CHLORIDE 0.9 % IV SOLN
INTRAVENOUS | Status: DC
  Administered 2012-12-02 (×2): 10 mL/h via INTRAVENOUS

## 2012-12-01 MED ORDER — NITROGLYCERIN 2 % TD OINT
1.0000 [in_us] | TOPICAL_OINTMENT | Freq: Three times a day (TID) | TRANSDERMAL | Status: DC
  Administered 2012-12-01 – 2012-12-06 (×17): 1 [in_us] via TOPICAL
  Filled 2012-12-01: qty 30

## 2012-12-01 MED ORDER — ACETAMINOPHEN 160 MG/5ML PO SOLN
650.0000 mg | Freq: Four times a day (QID) | ORAL | Status: DC | PRN
  Administered 2012-12-01: 650 mg via ORAL
  Filled 2012-12-01 (×2): qty 20.3

## 2012-12-01 MED ORDER — VITAL AF 1.2 CAL PO LIQD: 1000.0000 mL | ORAL | Status: DC

## 2012-12-01 NOTE — Progress Notes (Signed)
Subjective:  Remains intubated but alert. Moves all 4 extremities.  Objective:  Vital Signs in the last 24 hours: Temp:  [99.1 F (37.3 C)-100.5 F (38.1 C)] 99.1 F (37.3 C) (09/26 1126) Pulse Rate:  [62-79] 62 (09/26 1400) Resp:  [10-47] 18 (09/26 1500) BP: (80-249)/(47-145) 121/54 mmHg (09/26 1500) SpO2:  [86 %-100 %] 99 % (09/26 1400) FiO2 (%):  [40 %-100 %] 40 % (09/26 1210) Weight:  [53.2 kg (117 lb 4.6 oz)] 53.2 kg (117 lb 4.6 oz) (09/26 0430)  Intake/Output from previous day: 09/25 0701 - 09/26 0700 In: 701.3 [I.V.:501.3; IV Piggyback:200] Out: 865 [Urine:865]  Physical Exam: General appearance: fatigued, No distress, slowed mentation and Unable to communicate except nodding yes and no, but appears more alert than yesterday and presently intubated  Lungs: wheezes bilaterally  Heart: regular rate and rhythm, S1, S2 normal, no murmur, click, rub or gallop distant heart sounds. S4 gallop present  Abdomen: soft, non-tender; bowel sounds normal; no masses, no organomegaly  Extremities: extremities normal, atraumatic, no cyanosis or edema  Neurologic: Patient is lethargic, unable to follow commands at this point, but does not a few but does not localize pain    Lab Results:  Recent Labs  11/30/12 1509 12/01/12 0500  WBC 5.3 5.6  HGB 14.9 13.4  PLT 178 150    Recent Labs  11/30/12 2227 12/01/12 0500  NA 144 145  K 3.3* 3.1*  CL 104 105  CO2 30 29  GLUCOSE 53* 102*  BUN 19 22  CREATININE 1.25* 1.30*    Recent Labs  11/30/12 1818 11/30/12 2229  TROPONINI <0.30 <0.30   Hepatic Function Panel  Recent Labs  11/30/12 1509  PROT 6.7  ALBUMIN 2.8*  AST 55*  ALT 72*  ALKPHOS 129*  BILITOT 0.2*   Lipid Panel     Component Value Date/Time   CHOL 149 05/29/2012 0657   TRIG 59 05/29/2012 0657   HDL 87 05/29/2012 0657   CHOLHDL 1.7 05/29/2012 0657   VLDL 12 05/29/2012 0657   LDLCALC 50 05/29/2012 0657     Assessment/Plan:  1. Respiratory failure due  to pulmonary edema due to hypertensive crisis. Diuresing well.  2. Hypertensive crisis: Most probably due to non compliance. Has had renal angiogram recently 6 months ago, no renal artery stenosis. 3. Diabetes mellitus uncontrolled. Episodes of hypoglycemia due to non compliance and confusion regarding medications. 4. SSS S/P PTVP implant.  Rec: Replete K, Change IV NTG to NTP and although BP up, has had episodes of hypotension. Will await for extubation. Continue diuresis today.   Pamella Pert, M.D. 12/01/2012, 5:19 PM [facility] Pager: [phone] Office: [phone] If no answer: [phone]

## 2012-12-01 NOTE — Progress Notes (Signed)
INITIAL NUTRITION ASSESSMENT  DOCUMENTATION CODES Per approved criteria  -Not Applicable   INTERVENTION:  Initiate Glucerna 1.2 formula at 20 ml/hr and increase by 10 ml every 4 hours to goal rate of 50 ml/hr to provide 1440 kcals, 72 gm protein, 966 ml of free water RD to follow for nutrition care plan  NUTRITION DIAGNOSIS: Inadequate oral intake related to inability to eat as evidenced by NPO status  Goal: EN to meet > 90% of estimated nutrition needs  Monitor:  EN regimen & tolerance, respiratory status, weight, labs, I/O's  Reason for Assessment: Consult  79 y.o. female  Admitting Dx: Acute respiratory failure  ASSESSMENT: Patient with PMH of chronic diastolic CHF, DM, poorly controlled HTN; granddaughter reported she heard a loud sound and found her fallen down; subsequently admitted with hypertensive emergency causing pulmonary edema and acute respiratory failure.  Patient is currently intubated on ventilator support ---> OGT in place MV: 8.4 Temp: 38.1   RD consulted for EN initiation & management.   Height: Ht Readings from Last 1 Encounters:  11/30/12 5\' 3"  (1.6 m)    Weight: Wt Readings from Last 1 Encounters:  12/01/12 117 lb 4.6 oz (53.2 kg)    Ideal Body Weight: 115 lb  % Ideal Body Weight: 101%  Wt Readings from Last 10 Encounters:  12/01/12 117 lb 4.6 oz (53.2 kg)  08/16/12 140 lb 10.5 oz (63.8 kg)  05/29/12 112 lb (50.803 kg)  05/29/12 112 lb (50.803 kg)  04/30/12 116 lb 13.5 oz (53 kg)  03/04/12 110 lb 7.2 oz (50.1 kg)  11/13/11 117 lb (53.071 kg)  10/17/11 114 lb 6.7 oz (51.9 kg)  07/31/11 123 lb 7.3 oz (56 kg)  07/31/11 123 lb 7.3 oz (56 kg)    Usual Body Weight: 112 lb  % Usual Body Weight: 104%  BMI:  Body mass index is 20.78 kg/(m^2).  Estimated Nutritional Needs: Kcal: 1350-1500 Protein: 70-80 gm Fluid: per MD  Skin: shin abrasion   Diet Order: NPO  EDUCATION NEEDS: -No education needs identified at this  time   Intake/Output Summary (Last 24 hours) at 12/01/12 1224 Last data filed at 12/01/12 1115  Gross per 24 hour  Intake 927.56 ml  Output    975 ml  Net -47.44 ml    Labs:   Recent Labs Lab 11/30/12 1509 11/30/12 2227 12/01/12 0500 12/01/12 0557  NA 145 144 145  --   K 2.7* 3.3* 3.1*  --   CL 106 104 105  --   CO2 32 30 29  --   BUN 16 19 22   --   CREATININE 1.20* 1.25* 1.30*  --   CALCIUM 8.6 8.0* 7.7*  --   MG  --   --   --  1.7  GLUCOSE 44* 53* 102*  --     CBG (last 3)   Recent Labs  12/01/12 0310 12/01/12 0720 12/01/12 1125  GLUCAP 97 107* 158*    Scheduled Meds: . antiseptic oral rinse  15 mL Mouth Rinse QID  . aspirin EC  81 mg Oral Daily  . chlorhexidine  15 mL Mouth Rinse BID  . cloNIDine  0.2 mg Oral BID  . enoxaparin (LOVENOX) injection  30 mg Subcutaneous Q24H  . furosemide  40 mg Intravenous Q8H  . insulin aspart  0-9 Units Subcutaneous Q4H  . losartan  100 mg Oral Daily  . metoprolol tartrate  50 mg Oral BID  . pantoprazole (PROTONIX) IV  40 mg Intravenous  Daily  . potassium chloride  40 mEq Per Tube TID  . sodium chloride  3 mL Intravenous Q12H    Continuous Infusions: . sodium chloride 20 mL/hr at 12/01/12 0146  . sodium chloride 15 mL/hr at 12/01/12 0146  . fentaNYL infusion INTRAVENOUS 25 mcg/hr (12/01/12 1100)  . nitroGLYCERIN 10 mcg/min (12/01/12 1610)    Past Medical History  Diagnosis Date  . Diabetes mellitus   . Hypertension   . Coronary artery disease   . Renal disorder   . Herniated lumbar intervertebral disc   . Cataract     cataract surgery scheduled for 03/31/11, 2 - left eye, 1 - right eye  . Renovascular hypertension      s/p left renal artery stent 12/2007.  S/P balloon angioplasty on 02/16/10 for ISR, BP was  controlled well since then.     . Claudication in peripheral vascular disease     02/16/10: Left CIA 9.0x28 Omnilink and REIA 8.0x40 seff expanding Zilver.   right subclavian artery stent 03/18/2008,  .  S/P carotid endarterectomy      left carotid endarterectomy  1991; Stroke and TIA in 1999 with right sided weakness, now with residual right arm weakness  . Stroke     TIA  . Peripheral vascular disease   . Anxiety     Past Surgical History  Procedure Laterality Date  . Appendectomy    . Abdominal hysterectomy    . Ureteral stent placement    . Carotid endarterectomy    . Laminectomy    . Back surgery    . Colonoscopy  07/30/2011    Procedure: COLONOSCOPY;  Surgeon: Louis Meckel, MD;  Location: [facility] ENDOSCOPY;  Service: Endoscopy;  Laterality: N/A;  gi bleed  . Esophagogastroduodenoscopy  07/30/2011    Procedure: ESOPHAGOGASTRODUODENOSCOPY (EGD);  Surgeon: Louis Meckel, MD;  Location: [facility] ENDOSCOPY;  Service: Endoscopy;  Laterality: N/A;  . Pacemaker insertion      Maureen Chatters, RD, LDN Pager #: [phone] After-Hours Pager #: [phone]

## 2012-12-01 NOTE — Progress Notes (Signed)
eLink Physician-Brief Progress Note Patient Name: Carmen [name] DOB: 20-Dec-1933 MRN: [id number]  Date of Service  12/01/2012   HPI/Events of Note  Temp of 102.1 but HD stable with WBC of 5.6.   eICU Interventions  Plan: BC x 2  PCXR Urine culture Hold ABX for now as HD stable - monitor   Intervention Category Minor Interventions: Clinical assessment - ordering diagnostic tests  Ceira Hoeschen 12/01/2012, 9:38 PM

## 2012-12-01 NOTE — Progress Notes (Signed)
PULMONARY  / CRITICAL CARE MEDICINE  Name: Carmen [name] MRN: [id number] DOB: 23-Apr-1933    ADMISSION DATE:  11/30/2012 CONSULTATION DATE:  11/30/2012  REFERRING MD :  Joya Martyr PRIMARY SERVICE: PCCM  CHIEF COMPLAINT:  Hypertensive   BRIEF PATIENT DESCRIPTION: 69 y/ female with hypertension and DM2 was admitted on 9/25 with hypertensive emergency causing pulmonary edema and acute respiratory failure.  SIGNIFICANT EVENTS / STUDIES:  9/25 admission  LINES / TUBES: 9/25 ETT >> 9/25 R IJ CVL >>  CULTURES: None  ANTIBIOTICS: None  SUBJECTIVE: No events overnight.  VITAL SIGNS: Temp:  [98 F (36.7 C)-100.5 F (38.1 C)] 99.1 F (37.3 C) (09/26 1126) Pulse Rate:  [60-79] 63 (09/26 1200) Resp:  [10-47] 18 (09/26 1200) BP: (80-249)/(47-145) 114/47 mmHg (09/26 1200) SpO2:  [86 %-100 %] 99 % (09/26 1200) FiO2 (%):  [40 %-100 %] 40 % (09/26 0808) Weight:  [53.2 kg (117 lb 4.6 oz)] 53.2 kg (117 lb 4.6 oz) (09/26 0430) HEMODYNAMICS:   VENTILATOR SETTINGS: Vent Mode:  [-] PRVC FiO2 (%):  [40 %-100 %] 40 % Set Rate:  [18 bmp] 18 bmp Vt Set:  [480 mL] 480 mL PEEP:  [5 cmH20] 5 cmH20 Plateau Pressure:  [12 cmH20-19 cmH20] 19 cmH20 INTAKE / OUTPUT: Intake/Output     09/25 0701 - 09/26 0700 09/26 0701 - 09/27 0700   I.V. (mL/kg) 501.3 (9.4) 156.3 (2.9)   NG/GT  70   IV Piggyback 200    Total Intake(mL/kg) 701.3 (13.2) 226.3 (4.3)   Urine (mL/kg/hr) 865 110 (0.4)   Total Output 865 110   Net -163.7 +116.3         PHYSICAL EXAMINATION:  Gen: sedated on vent HEENT: NCAT, PERRL ETT in place PULM: loud crackles bilaterally CV: RRR, no mgr, no JVD AB: BS+, soft, nontender, no hsm Ext: warm, trace edema, no clubbing, no cyanosis Derm: no rash or skin breakdown Neuro: sedated on vent  LABS:  CBC Recent Labs     11/30/12  1509  12/01/12  0500  WBC  5.3  5.6  HGB  14.9  13.4  HCT  42.3  38.1  PLT  178  150   Coag's No results found for this basename: APTT, INR,  in  the last 72 hours BMET Recent Labs     11/30/12  1509  11/30/12  2227  12/01/12  0500  NA  145  144  145  K  2.7*  3.3*  3.1*  CL  106  104  105  CO2  32  30  29  BUN  16  19  22   CREATININE  1.20*  1.25*  1.30*  GLUCOSE  44*  53*  102*   Electrolytes Recent Labs     11/30/12  1509  11/30/12  2227  12/01/12  0500  12/01/12  0557  CALCIUM  8.6  8.0*  7.7*   --   MG   --    --    --   1.7   Sepsis Markers No results found for this basename: LACTICACIDVEN, PROCALCITON, O2SATVEN,  in the last 72 hours ABG Recent Labs     11/30/12  1605  11/30/12  1939  11/30/12  2334  PHART  7.330*  7.280*  7.475*  PCO2ART  66.5*  67.1*  44.5  PO2ART  63.0*  95.0  313.0*   Liver Enzymes Recent Labs     11/30/12  1509  AST  55*  ALT  72*  ALKPHOS  129*  BILITOT  0.2*  ALBUMIN  2.8*   Cardiac Enzymes Recent Labs     11/30/12  1503  11/30/12  1509  11/30/12  1818  11/30/12  2229  TROPONINI  <0.30   --   <0.30  <0.30  PROBNP   --   10923.0*   --    --    Glucose Recent Labs     12/01/12  0042  12/01/12  0112  12/01/12  0138  12/01/12  0310  12/01/12  0720  12/01/12  1125  GLUCAP  50*  89  103*  97  107*  158*    Imaging Portable Chest Xray In Am  12/01/2012   CLINICAL DATA:  Endotracheal tube placement.  EXAM: PORTABLE CHEST - 1 VIEW  COMPARISON:  11/30/2012  FINDINGS: Endotracheal tube ends 3 cm above the carina. Right IJ catheter in stable position. Enteric tube crosses the diaphragm. No interval displacement of dual-chamber left approach pacer wires.  Unchanged heart enlargement and heart shape. There is decreased interstitial prominence, with persistent bibasilar opacities, partly pleural fluid on the left. Larger appearing left effusion may be from patient positioning. No evidence of pneumothorax. Osteopenia and advanced right glenohumeral osteoarthritis. Vascular stenting at the thoracic inlet.  IMPRESSION: 1. Stable positioning of tubes and lines. 2. Improved  pulmonary edema. 3. A left pleural effusion appears larger today, which may be related to differences in patient positioning. 4. Lower lung atelectasis or consolidation.   Electronically Signed   By: Tiburcio Pea   On: 12/01/2012 05:46   Dg Chest Port 1 View  11/30/2012   CLINICAL DATA:  Central line placement.  EXAM: PORTABLE CHEST - 1 VIEW  COMPARISON:  Earlier same date. CT 08/12/2012.  FINDINGS: 2244 hr. There is mild patient rotation to the left. The tip of the endotracheal tube is not well visualized due to overlap with other support structures. However, it appears lower, within 3 cm of the carina. There is a new right IJ central venous catheter extending to the lower SVC level. Nasogastric tube projects into the stomach. Left subclavian pacemaker leads appear unchanged.  Pulmonary edema has slightly improved. There is persistent retrocardiac airspace disease and a probable small left pleural effusion. There is no pneumothorax. The heart size and mediastinal contours are stable.  IMPRESSION: 1. Central line placement as described. No pneumothorax. 2. Endotracheal tube tip is not well visualized, but appears lower, within 3 cm of the carina. 3. Improved pulmonary edema.   Electronically Signed   By: Roxy Horseman   On: 11/30/2012 23:06   Dg Chest Port 1 View  11/30/2012   CLINICAL DATA:  Intubated.  EXAM: PORTABLE CHEST - 1 VIEW  COMPARISON:  Earlier today.  FINDINGS: Interval endotracheal tube with its tip 4.6 cm above the carina. Interval nasogastric tube extending into the stomach. The cardiac silhouette remains enlarged. Increased prominence of the interstitial markings with some alveolar opacities bilaterally. Stable left subclavian pacemaker leads. Stable dense opacity in the left lower lobe. Diffuse osteopenia and right shoulder degenerative changes.  IMPRESSION: 1. Progressive changes of acute congestive heart failure. 2. Stable dense left lower lobe atelectasis or pneumonia.   Electronically  Signed   By: Gordan Payment   On: 11/30/2012 20:42   Dg Chest Portable 1 View  11/30/2012   CLINICAL DATA:  Shortness of breath. Smoker.  EXAM: PORTABLE CHEST - 1 VIEW  COMPARISON:  Previous examinations.  FINDINGS: The cardiac silhouette remains at mildly  enlarged. The pulmonary vasculature and interstitial markings are prominent with mild progression of the interstitial prominence since 08/12/2012. Small left pleural effusion. Stable left subclavian pacemaker leads. Diffuse osteopenia and right shoulder degenerative changes.  IMPRESSION: Mild changes of acute congestive heart failure superimposed on chronic interstitial lung disease.   Electronically Signed   By: Gordan Payment   On: 11/30/2012 15:49   CXR: bilateral pulmonary edema, ETT in place, pacer  ASSESSMENT / PLAN:  PULMONARY A: Acute hypercarbic and hypoxemic respiratory failure P:   - Begin PS trials but no extubation until more diureses. - Diureses as below. - Daily cxr/wua/sbt. - PRN albuterol. - See cards.  CARDIOVASCULAR A: Hypertensive emergency > initial MAP 150; goal MAP first 24 hours is 112 P:  - NTC for BP control per cards recommendations with target MAP of 105-125 per Dr. Jacinto Halim. - Continue metoprolol 1/2 home dose. - Continue clonidine. - F/u cardiology recs.  RENAL A:  S/p renal artery stenting in past Hypokalemia, uncertain how much replacement given in ED P:   - Monitor UOP. - Lasix 40 mg IV q8 x2 doses. - Replace electrolytes as needed.  GASTROINTESTINAL A:  No acute issues P:   - OG tube. - Start TF. - Pantoprazole for stress ulcer prophylaxis.  HEMATOLOGIC A:  No acute issues P:  - Monitor for bleeding.  INFECTIOUS A:  No acute issues P:   - Monitor for fever.  ENDOCRINE A: DM2 P:   - SSI.  NEUROLOGIC A:  Sedation needs for vent synchrony P:   - Fentanyl gtt and prn versed titrated to RASS -1.  TODAY'S SUMMARY: Maintain on PS today, aggressive diureses renal function permitting,  will SBT in AM.  I have personally obtained a history, examined the patient, evaluated laboratory and imaging results, formulated the assessment and plan and placed orders.  CRITICAL CARE: The patient is critically ill with multiple organ systems failure and requires high complexity decision making for assessment and support, frequent evaluation and titration of therapies, application of advanced monitoring technologies and extensive interpretation of multiple databases. Critical Care Time devoted to patient care services described in this note is 35 minutes.   Tedrick Port,MD Pulmonary and Critical Care Medicine [facility] Pager: [phone]  12/01/2012, 12:22 PM

## 2012-12-01 NOTE — Progress Notes (Signed)
Inpatient Diabetes Program Recommendations  AACE/ADA: New Consensus Statement on Inpatient Glycemic Control (2013)  Target Ranges:  Prepandial:   less than 140 mg/dL      Peak postprandial:   less than 180 mg/dL (1-2 hours)      Critically ill patients:  140 - 180 mg/dL   Inpatient Diabetes Program Recommendations HgbA1C: order to assess prehospital glucose control  Note: A1C in March of this year was >14. Thank you  Piedad Climes BSN, RN,CDE Inpatient Diabetes Coordinator [phone] (team pager)

## 2012-12-01 NOTE — Care Management Note (Addendum)
    Page 1 of 1   12/04/2012     11:42:58 AM   CARE MANAGEMENT NOTE 12/04/2012  Patient:  Carmen [name], Carmen [name]   Account Number:  [id number]  Date Initiated:  12/01/2012  Documentation initiated by:  Junius Creamer  Subjective/Objective Assessment:   adm w resp failure, vent     Action/Plan:   lives w husband, pcp dr Zella Ball sanders  supp granda   Anticipated DC Date:     Anticipated DC Plan:    In-house referral  Clinical Social Worker      DC Planning Services  CM consult      Choice offered to / List presented to:             Status of service:   Medicare Important Message given?   (If response is "NO", the following Medicare IM given date fields will be blank) Date Medicare IM given:   Date Additional Medicare IM given:    Discharge Disposition:    Per UR Regulation:  Reviewed for med. necessity/level of care/duration of stay  If discussed at Long Length of Stay Meetings, dates discussed:    Comments:  9/29 1141a debbie Eldar Robitaille rn,bsn pt still lethargic but off vent. have made sw ref in case pt does not improve and needs snf.

## 2012-12-01 NOTE — ED Notes (Signed)
20 mg etomidate and 100 mg succynlcholine IV given to pt for sedation re: intubation.

## 2012-12-02 ENCOUNTER — Inpatient Hospital Stay (HOSPITAL_COMMUNITY): Payer: Medicare Other

## 2012-12-02 DIAGNOSIS — N179 Acute kidney failure, unspecified: Secondary | ICD-10-CM

## 2012-12-02 LAB — BASIC METABOLIC PANEL
CO2: 28 mEq/L (ref 19–32)
Calcium: 8.4 mg/dL (ref 8.4–10.5)
Chloride: 111 mEq/L (ref 96–112)
Creatinine, Ser: 1.49 mg/dL — ABNORMAL HIGH (ref 0.50–1.10)
Glucose, Bld: 164 mg/dL — ABNORMAL HIGH (ref 70–99)
Potassium: 3.2 mEq/L — ABNORMAL LOW (ref 3.5–5.1)
Sodium: 148 mEq/L — ABNORMAL HIGH (ref 135–145)

## 2012-12-02 LAB — GLUCOSE, CAPILLARY
Glucose-Capillary: 186 mg/dL — ABNORMAL HIGH (ref 70–99)
Glucose-Capillary: 188 mg/dL — ABNORMAL HIGH (ref 70–99)
Glucose-Capillary: 207 mg/dL — ABNORMAL HIGH (ref 70–99)
Glucose-Capillary: 217 mg/dL — ABNORMAL HIGH (ref 70–99)
Glucose-Capillary: 256 mg/dL — ABNORMAL HIGH (ref 70–99)

## 2012-12-02 LAB — CBC
Hemoglobin: 12.6 g/dL (ref 12.0–15.0)
MCH: 33.3 pg (ref 26.0–34.0)
Platelets: 133 10*3/uL — ABNORMAL LOW (ref 150–400)
RBC: 3.78 MIL/uL — ABNORMAL LOW (ref 3.87–5.11)
WBC: 7.1 10*3/uL (ref 4.0–10.5)

## 2012-12-02 LAB — PHOSPHORUS: Phosphorus: 3.5 mg/dL (ref 2.3–4.6)

## 2012-12-02 LAB — MAGNESIUM: Magnesium: 2.1 mg/dL (ref 1.5–2.5)

## 2012-12-02 MED ORDER — POTASSIUM CHLORIDE 20 MEQ/15ML (10%) PO LIQD
40.0000 meq | Freq: Three times a day (TID) | ORAL | Status: AC
  Administered 2012-12-02 (×3): 40 meq
  Filled 2012-12-02 (×3): qty 30

## 2012-12-02 MED ORDER — FUROSEMIDE 10 MG/ML IJ SOLN
40.0000 mg | Freq: Four times a day (QID) | INTRAMUSCULAR | Status: AC
  Administered 2012-12-02: 40 mg via INTRAVENOUS
  Filled 2012-12-02: qty 4

## 2012-12-02 MED ORDER — FUROSEMIDE 10 MG/ML IJ SOLN
40.0000 mg | Freq: Four times a day (QID) | INTRAMUSCULAR | Status: DC
  Administered 2012-12-02: 40 mg via INTRAVENOUS
  Filled 2012-12-02: qty 4

## 2012-12-02 MED ORDER — HYDRALAZINE HCL 20 MG/ML IJ SOLN
10.0000 mg | INTRAMUSCULAR | Status: AC | PRN
  Administered 2012-12-02 – 2012-12-04 (×6): 10 mg via INTRAVENOUS
  Filled 2012-12-02 (×6): qty 1

## 2012-12-02 NOTE — Progress Notes (Signed)
PULMONARY  / CRITICAL CARE MEDICINE  Name: Carmen [name] MRN: [id number] DOB: 29-Oct-1933    ADMISSION DATE:  11/30/2012 CONSULTATION DATE:  11/30/2012  REFERRING MD :  Joya Martyr PRIMARY SERVICE: PCCM  CHIEF COMPLAINT:  Hypertensive   BRIEF PATIENT DESCRIPTION: 75 y/ female with hypertension and DM2 was admitted on 9/25 with hypertensive emergency causing pulmonary edema and acute respiratory failure.  SIGNIFICANT EVENTS / STUDIES:  9/25 admission  LINES / TUBES: 9/25 ETT >> 9/25 R IJ CVL >>  CULTURES: None  ANTIBIOTICS: None  SUBJECTIVE: No events overnight.  VITAL SIGNS: Temp:  [98.8 F (37.1 C)-102.1 F (38.9 C)] 98.8 F (37.1 C) (09/27 0800) Pulse Rate:  [59-100] 100 (09/27 0803) Resp:  [14-18] 18 (09/27 0803) BP: (114-241)/(41-71) 241/70 mmHg (09/27 0800) SpO2:  [98 %-100 %] 100 % (09/27 0803) FiO2 (%):  [40 %] 40 % (09/27 0803) Weight:  [53.5 kg (117 lb 15.1 oz)] 53.5 kg (117 lb 15.1 oz) (09/27 0500) HEMODYNAMICS: CVP:  [2 mmHg] 2 mmHg VENTILATOR SETTINGS: Vent Mode:  [-] PRVC FiO2 (%):  [40 %] 40 % Set Rate:  [18 bmp] 18 bmp Vt Set:  [480 mL] 480 mL PEEP:  [5 cmH20] 5 cmH20 Plateau Pressure:  [17 cmH20-19 cmH20] 18 cmH20 INTAKE / OUTPUT: Intake/Output     09/26 0701 - 09/27 0700 09/27 0701 - 09/28 0700   I.V. (mL/kg) 879.8 (16.4)    NG/GT 600    IV Piggyback 50    Total Intake(mL/kg) 1529.8 (28.6)    Urine (mL/kg/hr) 1455 (1.1)    Total Output 1455     Net +74.8           PHYSICAL EXAMINATION:  Gen: sedated on vent HEENT: NCAT, PERRL ETT in place PULM: loud crackles bilaterally CV: RRR, no mgr, no JVD AB: BS+, soft, nontender, no hsm Ext: warm, trace edema, no clubbing, no cyanosis Derm: no rash or skin breakdown Neuro: sedated on vent  LABS:  CBC Recent Labs     11/30/12  1509  12/01/12  0500  12/02/12  0500  WBC  5.3  5.6  7.1  HGB  14.9  13.4  12.6  HCT  42.3  38.1  36.3  PLT  178  150  133*   Coag's No results found for this  basename: APTT, INR,  in the last 72 hours BMET Recent Labs     11/30/12  2227  12/01/12  0500  12/02/12  0500  NA  144  145  148*  K  3.3*  3.1*  3.2*  CL  104  105  111  CO2  30  29  28   BUN  19  22  33*  CREATININE  1.25*  1.30*  1.49*  GLUCOSE  53*  102*  164*   Electrolytes Recent Labs     11/30/12  2227  12/01/12  0500  12/01/12  0557  12/02/12  0500  CALCIUM  8.0*  7.7*   --   8.4  MG   --    --   1.7  2.1  PHOS   --    --    --   3.5   Sepsis Markers No results found for this basename: LACTICACIDVEN, PROCALCITON, O2SATVEN,  in the last 72 hours ABG Recent Labs     11/30/12  1605  11/30/12  1939  11/30/12  2334  PHART  7.330*  7.280*  7.475*  PCO2ART  66.5*  67.1*  44.5  PO2ART  63.0*  95.0  313.0*   Liver Enzymes Recent Labs     11/30/12  1509  AST  55*  ALT  72*  ALKPHOS  129*  BILITOT  0.2*  ALBUMIN  2.8*   Cardiac Enzymes Recent Labs     11/30/12  1503  11/30/12  1509  11/30/12  1818  11/30/12  2229  TROPONINI  <0.30   --   <0.30  <0.30  PROBNP   --   10923.0*   --    --    Glucose Recent Labs     12/01/12  1125  12/01/12  1734  12/01/12  2036  12/02/12  0015  12/02/12  0319  12/02/12  0825  GLUCAP  158*  135*  120*  256*  139*  207*    Imaging Dg Chest Port 1 View  12/02/2012   *RADIOLOGY REPORT*  Clinical Data: Endotracheal tube placement  PORTABLE CHEST - 1 VIEW  Comparison: Prior chest x-ray 12/01/2012  Findings: The tip of endotracheal tube is 2 cm above the carina. Right IJ vascular sheath catheter in similar position with the tip in the mid SVC.  Nasogastric tube is present.  The tip projects over the stomach.  Left subclavian approach cardiac rhythm maintenance device in unchanged position.  Leads project over the right atrium and right ventricle.  Stable borderline cardiomegaly with left ventricular prominence.  Persistent dense left basilar opacity with small pleural effusion.  Mild patchy opacity in the right base.  No  pneumothorax.  Atherosclerotic calcifications noted in the transverse aorta.  IMPRESSION:  1.  Continued incremental improvement and background interstitial edema and right basilar atelectasis versus infiltrate. 2.  Persistent dense left basilar opacity and small left effusion. Opacity may reflect pleural fluid combined with atelectasis and / or infiltrate. 3.  Stable and satisfactory support apparatus.   Original Report Authenticated By: Malachy Moan, M.D.   Dg Chest Port 1 View  12/01/2012   CLINICAL DATA:  Fever.  EXAM: PORTABLE CHEST - 1 VIEW  COMPARISON:  Earlier today at 511 hr  FINDINGS: 2155 hr. Nasogastric tube extends beyond the inferior aspect of the film. Endotracheal tube terminates appropriately. Pacer. Right internal jugular line is poorly visualized centrally. Cardiomegaly accentuated by AP portable technique. Small left pleural effusion is similar. Slight improvement in mild interstitial edema. Slight improvement right base atelectasis. Dense left base airspace disease persists.  IMPRESSION: Slight improvement in interstitial edema and right base atelectasis.  Similar left pleural effusion with adjacent atelectasis or infection.   Electronically Signed   By: Jeronimo Greaves   On: 12/01/2012 22:35   Portable Chest Xray In Am  12/01/2012   CLINICAL DATA:  Endotracheal tube placement.  EXAM: PORTABLE CHEST - 1 VIEW  COMPARISON:  11/30/2012  FINDINGS: Endotracheal tube ends 3 cm above the carina. Right IJ catheter in stable position. Enteric tube crosses the diaphragm. No interval displacement of dual-chamber left approach pacer wires.  Unchanged heart enlargement and heart shape. There is decreased interstitial prominence, with persistent bibasilar opacities, partly pleural fluid on the left. Larger appearing left effusion may be from patient positioning. No evidence of pneumothorax. Osteopenia and advanced right glenohumeral osteoarthritis. Vascular stenting at the thoracic inlet.  IMPRESSION:  1. Stable positioning of tubes and lines. 2. Improved pulmonary edema. 3. A left pleural effusion appears larger today, which may be related to differences in patient positioning. 4. Lower lung atelectasis or consolidation.   Electronically Signed   By: Christiane Ha  Watts   On: 12/01/2012 05:46   Dg Chest Port 1 View  11/30/2012   CLINICAL DATA:  Central line placement.  EXAM: PORTABLE CHEST - 1 VIEW  COMPARISON:  Earlier same date. CT 08/12/2012.  FINDINGS: 2244 hr. There is mild patient rotation to the left. The tip of the endotracheal tube is not well visualized due to overlap with other support structures. However, it appears lower, within 3 cm of the carina. There is a new right IJ central venous catheter extending to the lower SVC level. Nasogastric tube projects into the stomach. Left subclavian pacemaker leads appear unchanged.  Pulmonary edema has slightly improved. There is persistent retrocardiac airspace disease and a probable small left pleural effusion. There is no pneumothorax. The heart size and mediastinal contours are stable.  IMPRESSION: 1. Central line placement as described. No pneumothorax. 2. Endotracheal tube tip is not well visualized, but appears lower, within 3 cm of the carina. 3. Improved pulmonary edema.   Electronically Signed   By: Roxy Horseman   On: 11/30/2012 23:06   Dg Chest Port 1 View  11/30/2012   CLINICAL DATA:  Intubated.  EXAM: PORTABLE CHEST - 1 VIEW  COMPARISON:  Earlier today.  FINDINGS: Interval endotracheal tube with its tip 4.6 cm above the carina. Interval nasogastric tube extending into the stomach. The cardiac silhouette remains enlarged. Increased prominence of the interstitial markings with some alveolar opacities bilaterally. Stable left subclavian pacemaker leads. Stable dense opacity in the left lower lobe. Diffuse osteopenia and right shoulder degenerative changes.  IMPRESSION: 1. Progressive changes of acute congestive heart failure. 2. Stable dense left  lower lobe atelectasis or pneumonia.   Electronically Signed   By: Gordan Payment   On: 11/30/2012 20:42   Dg Chest Portable 1 View  11/30/2012   CLINICAL DATA:  Shortness of breath. Smoker.  EXAM: PORTABLE CHEST - 1 VIEW  COMPARISON:  Previous examinations.  FINDINGS: The cardiac silhouette remains at mildly enlarged. The pulmonary vasculature and interstitial markings are prominent with mild progression of the interstitial prominence since 08/12/2012. Small left pleural effusion. Stable left subclavian pacemaker leads. Diffuse osteopenia and right shoulder degenerative changes.  IMPRESSION: Mild changes of acute congestive heart failure superimposed on chronic interstitial lung disease.   Electronically Signed   By: Gordan Payment   On: 11/30/2012 15:49   CXR: bilateral pulmonary edema, ETT in place, pacer  ASSESSMENT / PLAN:  PULMONARY A: Acute hypercarbic and hypoxemic respiratory failure P:    - PS but no extubation given in WOB. - Diureses as below. - Daily cxr/wua/sbt. - PRN albuterol. - See cards.  CARDIOVASCULAR A: Hypertensive emergency > initial MAP 150; goal MAP first 24 hours is 112 P:  - NTC for BP control per cards recommendations with target MAP of 105-125 per Dr. Jacinto Halim. - Continue metoprolol 1/2 home dose. - Continue clonidine. - F/u cardiology recs.  RENAL A:  S/p renal artery stenting in past Hypokalemia, uncertain how much replacement given in ED P:   - Monitor UOP. - Lasix 40 mg IV q6 x3 doses. - Replace electrolytes as needed.  GASTROINTESTINAL A:  No acute issues P:   - OG tube. - Continue TF. - Pantoprazole for stress ulcer prophylaxis.  HEMATOLOGIC A:  No acute issues P:  - Monitor for bleeding.  INFECTIOUS A:  No acute issues P:   - Monitor for fever.  ENDOCRINE A: DM2 P:   - SSI.  NEUROLOGIC A:  Sedation needs for vent synchrony  P:   - Fentanyl gtt and prn versed titrated to RASS -1.  TODAY'S SUMMARY: Maintain on PS today, aggressive  diureses renal function permitting, will SBT in AM.  I have personally obtained a history, examined the patient, evaluated laboratory and imaging results, formulated the assessment and plan and placed orders.  CRITICAL CARE: The patient is critically ill with multiple organ systems failure and requires high complexity decision making for assessment and support, frequent evaluation and titration of therapies, application of advanced monitoring technologies and extensive interpretation of multiple databases. Critical Care Time devoted to patient care services described in this note is 35 minutes.   YACOUB,WESAM,MD Pulmonary and Critical Care Medicine [facility] Pager: [phone]  12/02/2012, 10:22 AM

## 2012-12-02 NOTE — Progress Notes (Signed)
Patient will be continued on the present medication, I have added hydralazine to be used on a when necessary basis for hypertension.  I suspect she probably may have left nasal pneumonia although there is no elevated white count, pulmonary edema has improved but chest x-ray, serum creatinine is trending up, will follow this up, if serum creatinine continues to increase, we will have to hold off on losartan.  However at this point would recommend holding off on Lasix as I cannot think congestive heart failure is a major issue at this point.

## 2012-12-03 ENCOUNTER — Inpatient Hospital Stay (HOSPITAL_COMMUNITY): Payer: Medicare Other

## 2012-12-03 DIAGNOSIS — I10 Essential (primary) hypertension: Secondary | ICD-10-CM

## 2012-12-03 LAB — BLOOD GAS, ARTERIAL
Acid-Base Excess: 1.9 mmol/L (ref 0.0–2.0)
FIO2: 0.4 %
RATE: 18 resp/min
TCO2: 26.9 mmol/L (ref 0–100)
pCO2 arterial: 38.2 mmHg (ref 35.0–45.0)
pO2, Arterial: 119 mmHg — ABNORMAL HIGH (ref 80.0–100.0)

## 2012-12-03 LAB — URINE CULTURE: Culture: NO GROWTH

## 2012-12-03 LAB — CBC
Hemoglobin: 12.9 g/dL (ref 12.0–15.0)
MCHC: 34.8 g/dL (ref 30.0–36.0)
MCV: 97.9 fL (ref 78.0–100.0)
RBC: 3.79 MIL/uL — ABNORMAL LOW (ref 3.87–5.11)
WBC: 7.8 10*3/uL (ref 4.0–10.5)

## 2012-12-03 LAB — GLUCOSE, CAPILLARY
Glucose-Capillary: 173 mg/dL — ABNORMAL HIGH (ref 70–99)
Glucose-Capillary: 187 mg/dL — ABNORMAL HIGH (ref 70–99)
Glucose-Capillary: 196 mg/dL — ABNORMAL HIGH (ref 70–99)
Glucose-Capillary: 229 mg/dL — ABNORMAL HIGH (ref 70–99)
Glucose-Capillary: 245 mg/dL — ABNORMAL HIGH (ref 70–99)
Glucose-Capillary: 257 mg/dL — ABNORMAL HIGH (ref 70–99)

## 2012-12-03 LAB — BASIC METABOLIC PANEL
BUN: 38 mg/dL — ABNORMAL HIGH (ref 6–23)
Chloride: 116 mEq/L — ABNORMAL HIGH (ref 96–112)
Creatinine, Ser: 1.37 mg/dL — ABNORMAL HIGH (ref 0.50–1.10)
GFR calc Af Amer: 42 mL/min — ABNORMAL LOW (ref >90)
GFR calc non Af Amer: 36 mL/min — ABNORMAL LOW (ref >90)
Glucose, Bld: 188 mg/dL — ABNORMAL HIGH (ref 70–99)
Potassium: 4.5 mEq/L (ref 3.5–5.1)
Sodium: 150 mEq/L — ABNORMAL HIGH (ref 135–145)

## 2012-12-03 LAB — PHOSPHORUS: Phosphorus: 3.1 mg/dL (ref 2.3–4.6)

## 2012-12-03 MED ORDER — METOPROLOL TARTRATE 25 MG/10 ML ORAL SUSPENSION
100.0000 mg | Freq: Two times a day (BID) | ORAL | Status: DC
  Filled 2012-12-03: qty 40

## 2012-12-03 MED ORDER — FUROSEMIDE 10 MG/ML IJ SOLN
INTRAMUSCULAR | Status: AC
  Administered 2012-12-03: 40 mg
  Filled 2012-12-03: qty 4

## 2012-12-03 MED ORDER — FUROSEMIDE 10 MG/ML IJ SOLN
40.0000 mg | Freq: Once | INTRAMUSCULAR | Status: AC
  Administered 2012-12-03: 40 mg via INTRAVENOUS

## 2012-12-03 MED ORDER — ALBUTEROL SULFATE (5 MG/ML) 0.5% IN NEBU
2.5000 mg | INHALATION_SOLUTION | RESPIRATORY_TRACT | Status: DC | PRN
  Administered 2012-12-03 – 2012-12-04 (×2): 2.5 mg via RESPIRATORY_TRACT
  Filled 2012-12-03 (×3): qty 0.5

## 2012-12-03 NOTE — Progress Notes (Addendum)
175 cc of fentanyl wasted from a 250 cc bag. Witnessed per two RN's. Marina Goodell RN and Georgina Peer RN

## 2012-12-03 NOTE — Progress Notes (Deleted)
Dr.  Marin Shutter notified that PO clonidine and metoprolol are ordered PO for this patient and patient is NPO.  No orders rec'd.

## 2012-12-03 NOTE — Progress Notes (Signed)
PULMONARY  / CRITICAL CARE MEDICINE  Name: IMONI KOHEN MRN: [id number] DOB: May 08, 1933    ADMISSION DATE:  11/30/2012 CONSULTATION DATE:  11/30/2012  REFERRING MD :  Joya Martyr PRIMARY SERVICE: PCCM  CHIEF COMPLAINT:  Hypertensive   BRIEF PATIENT DESCRIPTION: 89 y/ female with hypertension and DM2 was admitted on 9/25 with hypertensive emergency causing pulmonary edema and acute respiratory failure.  SIGNIFICANT EVENTS / STUDIES:  9/25 admission  LINES / TUBES: 9/25 ETT >> 9/25 R IJ CVL >>  CULTURES: None  ANTIBIOTICS: None  SUBJECTIVE: No events overnight.  VITAL SIGNS: Temp:  [98.4 F (36.9 C)-100.8 F (38.2 C)] 100.8 F (38.2 C) (09/28 0800) Pulse Rate:  [60-83] 73 (09/28 1000) Resp:  [14-27] 27 (09/28 1000) BP: (120-208)/(34-74) 188/51 mmHg (09/28 1000) SpO2:  [97 %-100 %] 99 % (09/28 1000) FiO2 (%):  [30 %-40 %] 30 % (09/28 1000) Weight:  [52.1 kg (114 lb 13.8 oz)] 52.1 kg (114 lb 13.8 oz) (09/28 0500) HEMODYNAMICS:   VENTILATOR SETTINGS: Vent Mode:  [-] CPAP FiO2 (%):  [30 %-40 %] 30 % Set Rate:  [18 bmp] 18 bmp Vt Set:  [480 mL] 480 mL PEEP:  [5 cmH20] 5 cmH20 Pressure Support:  [10 cmH20] 10 cmH20 Plateau Pressure:  [17 cmH20-18 cmH20] 17 cmH20 INTAKE / OUTPUT: Intake/Output     09/27 0701 - 09/28 0700 09/28 0701 - 09/29 0700   I.V. (mL/kg) 795.5 (15.3) 90 (1.7)   NG/GT 1320 175   IV Piggyback     Total Intake(mL/kg) 2115.5 (40.6) 265 (5.1)   Urine (mL/kg/hr) 2600 (2.1) 135 (0.6)   Total Output 2600 135   Net -484.5 +130         PHYSICAL EXAMINATION:  Gen: sedated on vent but more alert and interactive this AM. HEENT: NCAT, PERRL ETT in place PULM: loud crackles bilaterally CV: RRR, no mgr, no JVD AB: BS+, soft, nontender, no hsm Ext: warm, trace edema, no clubbing, no cyanosis Derm: no rash or skin breakdown Neuro: sedated on vent  LABS:  CBC Recent Labs     12/01/12  0500  12/02/12  0500  12/03/12  0422  WBC  5.6  7.1  7.8  HGB   13.4  12.6  12.9  HCT  38.1  36.3  37.1  PLT  150  133*  133*   Coag's No results found for this basename: APTT, INR,  in the last 72 hours BMET Recent Labs     12/01/12  0500  12/02/12  0500  12/03/12  0422  NA  145  148*  150*  K  3.1*  3.2*  4.5  CL  105  111  116*  CO2  29  28  27   BUN  22  33*  38*  CREATININE  1.30*  1.49*  1.37*  GLUCOSE  102*  164*  188*   Electrolytes Recent Labs     12/01/12  0500  12/01/12  0557  12/02/12  0500  12/03/12  0422  CALCIUM  7.7*   --   8.4  8.6  MG   --   1.7  2.1  2.2  PHOS   --    --   3.5  3.1   Sepsis Markers No results found for this basename: LACTICACIDVEN, PROCALCITON, O2SATVEN,  in the last 72 hours ABG Recent Labs     11/30/12  1939  11/30/12  2334  12/03/12  0340  PHART  7.280*  7.475*  7.443  PCO2ART  67.1*  44.5  38.2  PO2ART  95.0  313.0*  119.0*   Liver Enzymes Recent Labs     11/30/12  1509  AST  55*  ALT  72*  ALKPHOS  129*  BILITOT  0.2*  ALBUMIN  2.8*   Cardiac Enzymes Recent Labs     11/30/12  1503  11/30/12  1509  11/30/12  1818  11/30/12  2229  TROPONINI  <0.30   --   <0.30  <0.30  PROBNP   --   10923.0*   --    --    Glucose Recent Labs     12/02/12  1156  12/02/12  1607  12/02/12  2142  12/03/12  0007  12/03/12  0414  12/03/12  0821  GLUCAP  217*  186*  188*  245*  168*  196*    Imaging Dg Chest Port 1 View  12/03/2012   CLINICAL DATA:  Intubation, evaluate endotracheal tube placement, history hypertension, diabetes, coronary artery disease, stroke  EXAM: PORTABLE CHEST - 1 VIEW  COMPARISON:  Portable exam 0759 hr compared to 12/02/2012  FINDINGS: Tip of endotracheal tube in the left mainstem bronchus; recommend withdrawal 3-4 cm.  Left subclavian transvenous pacemaker leads project over right atrium and right ventricle.  Nasogastric tube extends into stomach.  Enlargement of cardiac silhouette with pulmonary vascular congestion.  Perihilar infiltrates compatible with  pulmonary edema and CHF, little changed.  Question accompanying atelectasis in left lower lobe.  Tip of right jugular line projects over SVC.  No definite pleural effusion or pneumothorax.  IMPRESSION: Persistent CHF.  Left mainstem bronchus intubation; recommend withdrawal of endotracheal tube 3-4 cm.  Findings called to [place] RN on 2H on 12/03/2012 at 0925 hr.   Electronically Signed   By: Ulyses Southward M.D.   On: 12/03/2012 09:26   Dg Chest Port 1 View  12/02/2012   *RADIOLOGY REPORT*  Clinical Data: Endotracheal tube placement  PORTABLE CHEST - 1 VIEW  Comparison: Prior chest x-ray 12/01/2012  Findings: The tip of endotracheal tube is 2 cm above the carina. Right IJ vascular sheath catheter in similar position with the tip in the mid SVC.  Nasogastric tube is present.  The tip projects over the stomach.  Left subclavian approach cardiac rhythm maintenance device in unchanged position.  Leads project over the right atrium and right ventricle.  Stable borderline cardiomegaly with left ventricular prominence.  Persistent dense left basilar opacity with small pleural effusion.  Mild patchy opacity in the right base.  No pneumothorax.  Atherosclerotic calcifications noted in the transverse aorta.  IMPRESSION:  1.  Continued incremental improvement and background interstitial edema and right basilar atelectasis versus infiltrate. 2.  Persistent dense left basilar opacity and small left effusion. Opacity may reflect pleural fluid combined with atelectasis and / or infiltrate. 3.  Stable and satisfactory support apparatus.   Original Report Authenticated By: Malachy Moan, M.D.   Dg Chest Port 1 View  12/01/2012   CLINICAL DATA:  Fever.  EXAM: PORTABLE CHEST - 1 VIEW  COMPARISON:  Earlier today at 511 hr  FINDINGS: 2155 hr. Nasogastric tube extends beyond the inferior aspect of the film. Endotracheal tube terminates appropriately. Pacer. Right internal jugular line is poorly visualized centrally. Cardiomegaly  accentuated by AP portable technique. Small left pleural effusion is similar. Slight improvement in mild interstitial edema. Slight improvement right base atelectasis. Dense left base airspace disease persists.  IMPRESSION: Slight improvement in interstitial edema and right base atelectasis.  Similar left pleural effusion with adjacent atelectasis or infection.   Electronically Signed   By: Jeronimo Greaves   On: 12/01/2012 22:35   CXR: bilateral pulmonary edema, ETT in place, pacer  ASSESSMENT / PLAN:  PULMONARY A: Acute hypercarbic and hypoxemic respiratory failure P:    - SBT to extubate today. - Lasix x1 dose. - PRN albuterol. - See cards.  CARDIOVASCULAR A: Hypertensive emergency > initial MAP 150; goal MAP first 24 hours is 112 P:  - BP control per cards. - Diureses as ordered. - F/u cardiology recs.  RENAL A:  S/p renal artery stenting in past Hypokalemia, uncertain how much replacement given in ED P:   - Monitor UOP. - Lasix 40 mg IV x1 doses. - Replace electrolytes as needed.  GASTROINTESTINAL A:  No acute issues P:   - OG tube. - Hold TF for extubation. - Pantoprazole for stress ulcer prophylaxis.  HEMATOLOGIC A:  No acute issues P:  - Monitor for bleeding.  INFECTIOUS A:  No acute issues P:   - Monitor for fever.  ENDOCRINE A: DM2 P:   - SSI.  NEUROLOGIC A:  Sedation needs for vent synchrony P:   - D/C sedation for extubation.  TODAY'S SUMMARY: Extubate patient, BP control per cards, PT/OT and OOB to chair, swallow evaluation.  I have personally obtained a history, examined the patient, evaluated laboratory and imaging results, formulated the assessment and plan and placed orders.  CRITICAL CARE: The patient is critically ill with multiple organ systems failure and requires high complexity decision making for assessment and support, frequent evaluation and titration of therapies, application of advanced monitoring technologies and extensive  interpretation of multiple databases. Critical Care Time devoted to patient care services described in this note is 35 minutes.   YACOUB,WESAM,MD Pulmonary and Critical Care Medicine [facility] Pager: [phone]  12/03/2012, 11:35 AM

## 2012-12-03 NOTE — Progress Notes (Addendum)
Subjective:  Patient continues to be intubated.  She continues to have paroxysmal episodes of elevated blood pressure.  She has not had any further temperature spikes.  Objective:  Vital Signs in the last 24 hours: Temp:  [98.4 F (36.9 C)-100.8 F (38.2 C)] 100.3 F (37.9 C) (09/28 1300) Pulse Rate:  [60-83] 73 (09/28 1300) Resp:  [14-27] 23 (09/28 1300) BP: (120-208)/(34-74) 177/50 mmHg (09/28 1200) SpO2:  [97 %-100 %] 100 % (09/28 1300) FiO2 (%):  [30 %-40 %] 30 % (09/28 1300) Weight:  [52.1 kg (114 lb 13.8 oz)] 52.1 kg (114 lb 13.8 oz) (09/28 0500)  Intake/Output from previous day: 09/27 0701 - 09/28 0700 In: 2115.5 [I.V.:795.5; NG/GT:1320] Out: 2600 [Urine:2600]  Physical Exam:   General appearance: fatigued, no distress, slowed mentation, intubaed Lungs: Clear Heart: regular rate and rhythm, S1, S2 normal, no murmur, click, rub or gallop distant heart sounds. S4 gallop present  Abdomen: soft, non-tender; bowel sounds normal; no masses, no organomegaly  Extremities: extremities normal, atraumatic, no cyanosis or edema, skin is dry and wrinkled. Neurologic: Patient is lethargic, unable to follow commands at this point, but does not a few but does not localize pain   Lab Results:  Recent Labs  12/02/12 0500 12/03/12 0422  WBC 7.1 7.8  HGB 12.6 12.9  PLT 133* 133*    Recent Labs  12/02/12 0500 12/03/12 0422  NA 148* 150*  K 3.2* 4.5  CL 111 116*  CO2 28 27  GLUCOSE 164* 188*  BUN 33* 38*  CREATININE 1.49* 1.37*    Recent Labs  11/30/12 1818 11/30/12 2229  TROPONINI <0.30 <0.30   Hepatic Function Panel  Recent Labs  11/30/12 1509  PROT 6.7  ALBUMIN 2.8*  AST 55*  ALT 72*  ALKPHOS 129*  BILITOT 0.2*   Lipid Panel     Component Value Date/Time   CHOL 149 05/29/2012 0657   TRIG 59 05/29/2012 0657   HDL 87 05/29/2012 0657   CHOLHDL 1.7 05/29/2012 0657   VLDL 12 05/29/2012 0657   LDLCALC 50 05/29/2012 0657   Tele: NSR  Assessment/Plan:  1.  Hypertensive urgency with pulmonary edema 2. Respiratory failure 3. PAD 4. Uncontrolled DM 5. Acute on chronic renal insufficiecy.  Recommendation: Discussed with the CCM regarding extubation and we can certainly continue to monitor the blood pressure, she has normal LV systolic function, she has no evidence of renal artery stenosis by recent renal angiography.  Noncompliance is a major issue with patient's presentation.  We will increase her beta blockers as tolerated.  She could not tolerate calcium channel blocker due to marked leg edema in the past.  However the can certainly lacer on a small dose.  Chest x-ray does reveal persistence of pulmonary edema, left base shadowing appears to be CHF, less likely pneumonia.   Pamella Pert, M.D. 12/03/2012, 2:29 PM [facility] Pager: [phone] Office: [phone] If no answer: [phone]

## 2012-12-03 NOTE — Progress Notes (Signed)
Extubated patient to 4l nasal cannula

## 2012-12-03 NOTE — Progress Notes (Signed)
Patient has bilateral breath sounds very coarse rhonchi upper airway.  Increased RR-24-30.  Patient will not cough, do incentive spirometery or flutter valve.  Dr. Marin Shutter notified of all parameters.  Orders rec'd.

## 2012-12-03 NOTE — Progress Notes (Addendum)
Dr. Marin Shutter notified that PO clonidine and metoprolol are ordered PO for the patient.  Patient is NPO for swallowing evaluation in AM.  No ordered rec'd. Continue to monitor.

## 2012-12-03 NOTE — Progress Notes (Signed)
Radiologist called to report tip of endotracheal tube in the left mainstem bronchus; recommended withdrawal 3-4 cm @0925 .  RT was called and notified @ 0927, en route.

## 2012-12-04 LAB — BASIC METABOLIC PANEL
BUN: 41 mg/dL — ABNORMAL HIGH (ref 6–23)
CO2: 23 mEq/L (ref 19–32)
Calcium: 8.8 mg/dL (ref 8.4–10.5)
GFR calc non Af Amer: 36 mL/min — ABNORMAL LOW (ref >90)
Potassium: 5.1 mEq/L (ref 3.5–5.1)
Sodium: 154 mEq/L — ABNORMAL HIGH (ref 135–145)

## 2012-12-04 LAB — PHOSPHORUS: Phosphorus: 4.6 mg/dL (ref 2.3–4.6)

## 2012-12-04 LAB — CBC
Hemoglobin: 14 g/dL (ref 12.0–15.0)
MCH: 34 pg (ref 26.0–34.0)
MCHC: 33.8 g/dL (ref 30.0–36.0)
Platelets: 154 10*3/uL (ref 150–400)
RBC: 4.12 MIL/uL (ref 3.87–5.11)

## 2012-12-04 LAB — GLUCOSE, CAPILLARY
Glucose-Capillary: 147 mg/dL — ABNORMAL HIGH (ref 70–99)
Glucose-Capillary: 263 mg/dL — ABNORMAL HIGH (ref 70–99)
Glucose-Capillary: 211 mg/dL — ABNORMAL HIGH (ref 70–99)

## 2012-12-04 LAB — MAGNESIUM: Magnesium: 2.2 mg/dL (ref 1.5–2.5)

## 2012-12-04 MED ORDER — METOPROLOL TARTRATE 1 MG/ML IV SOLN
5.0000 mg | Freq: Four times a day (QID) | INTRAVENOUS | Status: DC
  Administered 2012-12-04 – 2012-12-06 (×10): 5 mg via INTRAVENOUS
  Filled 2012-12-04 (×12): qty 5

## 2012-12-04 MED ORDER — ALBUTEROL SULFATE (5 MG/ML) 0.5% IN NEBU
2.5000 mg | INHALATION_SOLUTION | Freq: Four times a day (QID) | RESPIRATORY_TRACT | Status: DC
  Administered 2012-12-04 – 2012-12-11 (×28): 2.5 mg via RESPIRATORY_TRACT
  Filled 2012-12-04 (×33): qty 0.5

## 2012-12-04 MED ORDER — FREE WATER: 300.0000 mL | Freq: Four times a day (QID) | Status: DC

## 2012-12-04 MED ORDER — DILTIAZEM HCL 100 MG IV SOLR
5.0000 mg/h | INTRAVENOUS | Status: DC
  Administered 2012-12-04: 5 mg/h via INTRAVENOUS
  Administered 2012-12-04 – 2012-12-05 (×4): 20 mg/h via INTRAVENOUS
  Filled 2012-12-04 (×6): qty 100

## 2012-12-04 MED ORDER — FUROSEMIDE 10 MG/ML IJ SOLN
40.0000 mg | Freq: Two times a day (BID) | INTRAMUSCULAR | Status: DC
  Administered 2012-12-04 – 2012-12-05 (×3): 40 mg via INTRAVENOUS
  Filled 2012-12-04 (×3): qty 4

## 2012-12-04 NOTE — Progress Notes (Signed)
Dr. Craige Cotta Notified of increased BP.  Orders rec'd.  Continue efforts to get patient to cough, deep breath and do flutter valve.  Patient has coarse rhonchi upper airway.  RR. 24-34.  Patient being turned q 2 hours, bed set on rotation, HOB increased to 30 degrees.  Continue monitoring.

## 2012-12-04 NOTE — Significant Event (Signed)
Pt remains NPO after extubation.  Was getting lopresor, clonidine per tube >> unable to receive now.  Increased BP and HR.  Will order scheduled IV lopressor until able to take oral medications.  Coralyn Helling, MD 12/04/2012, 12:50 AM

## 2012-12-04 NOTE — Progress Notes (Signed)
Dr. Craige Cotta notified of increased WOB and inability to cough up secretions.  Orders rec'd to put patient on bipap.  RT notified.  Grand daughter Darreld Mclean [phone] and husband, Zandra Lajeunesse [phone] notified of all parameters.  Placed on Bipap 15/5 Fio2 40%.  Nurse at bedside with patient.

## 2012-12-04 NOTE — Progress Notes (Signed)
SLP Cancellation Note  Patient Details Name: Carmen [name] MRN: [id number] DOB: 1934/01/06   Cancelled treatment:       Reason Eval/Treat Not Completed: Medical issues which prohibited therapy (On Bipap. Will f/u. )  Ferdinand Lango MA, CCC-SLP [phone]  Carmen [name] 12/04/2012, 9:12 AM

## 2012-12-04 NOTE — Progress Notes (Signed)
NUTRITION FOLLOW UP  Intervention:    Await swallow evaluation/SLP recommendations RD to follow for nutrition care plan, add interventions accordingly  Nutrition Dx:   Inadequate oral intake related to inability to eat as evidenced by NPO status, ongoing  Goal:   Oral intake vs EN to meet >/= 90% of their estimated nutrition needs, currently unmet  Monitor:   EN re-initiation, PO diet advancement, respiratory status, weight, labs, I/O's  Assessment:   Patient with PMH of chronic diastolic CHF, DM, poorly controlled HTN; granddaughter reported she heard a loud sound and found her fallen down; subsequently admitted with hypertensive emergency causing pulmonary edema and acute respiratory failure.  Patient extubated 8/28.  Glucerna 1.2 formula via OGT discontinued with extubation.  BiPAP prn.  Patient continues to have paroxysmal episodes of elevated blood pressure. Speech Path consulted for swallow evaluation, however, unable to assess at time of visit.    Height: Ht Readings from Last 1 Encounters:  11/30/12 5\' 3"  (1.6 m)    Weight Status:   Wt Readings from Last 1 Encounters:  12/04/12 112 lb 3.4 oz (50.9 kg)    Re-estimated needs:  Kcal: 1250-1450 Protein: 70-80 gm Fluid: per MD  Skin: skin abrasion  Diet Order: NPO   Intake/Output Summary (Last 24 hours) at 12/04/12 1318 Last data filed at 12/04/12 1200  Gross per 24 hour  Intake 601.25 ml  Output   2260 ml  Net -1658.75 ml    Labs:   Recent Labs Lab 12/02/12 0500 12/03/12 0422 12/04/12 0439  NA 148* 150* 154*  K 3.2* 4.5 5.1  CL 111 116* 116*  CO2 28 27 23   BUN 33* 38* 41*  CREATININE 1.49* 1.37* 1.36*  CALCIUM 8.4 8.6 8.8  MG 2.1 2.2 2.2  PHOS 3.5 3.1 4.6  GLUCOSE 164* 188* 193*    CBG (last 3)   Recent Labs  12/03/12 2322 12/04/12 0430 12/04/12 0801  GLUCAP 125* 174* 147*    Scheduled Meds: . albuterol  2.5 mg Nebulization QID  . aspirin EC  81 mg Oral Daily  . cloNIDine  0.2 mg  Oral BID  . enoxaparin (LOVENOX) injection  30 mg Subcutaneous Q24H  . free water  300 mL Per Tube Q6H  . furosemide  40 mg Intravenous BID  . insulin aspart  0-9 Units Subcutaneous Q4H  . losartan  100 mg Oral Daily  . metoprolol  5 mg Intravenous Q6H  . nitroGLYCERIN  1 inch Topical TID  . pantoprazole (PROTONIX) IV  40 mg Intravenous Daily  . sodium chloride  3 mL Intravenous Q12H    Continuous Infusions: . sodium chloride 20 mL/hr at 12/02/12 1900  . sodium chloride Stopped (12/03/12 2000)  . diltiazem (CARDIZEM) infusion 15 mg/hr (12/04/12 1100)  . fentaNYL infusion INTRAVENOUS 25 mcg/hr (12/02/12 1900)    Maureen Chatters, RD, LDN Pager #: [phone] After-Hours Pager #: [phone]

## 2012-12-04 NOTE — Significant Event (Signed)
Pt with increased WOB.  Will try on NiPPV.  Coralyn Helling, MD 12/04/2012, 1:50 AM

## 2012-12-04 NOTE — Progress Notes (Signed)
Pt not on bipap at this time.  Pt on 3lpm [place] and no distress. Rt will continue to monitor and place bipap on pt before bed.

## 2012-12-04 NOTE — Progress Notes (Signed)
Subjective:  Extubated yesterday afternoon but again developed respiratory distress needing Bi-PAP. She continues to have paroxysmal episodes of elevated blood pressure.  She has not had any further temperature spikes. Remains lethargic and does respond by moving her extremities and nodding yes and no.  Objective:  Vital Signs in the last 24 hours: Temp:  [99 F (37.2 C)-100.3 F (37.9 C)] 99.4 F (37.4 C) (09/29 0755) Pulse Rate:  [66-102] 81 (09/29 0815) Resp:  [18-35] 26 (09/29 0815) BP: (141-238)/(38-64) 191/49 mmHg (09/29 0700) SpO2:  [93 %-100 %] 100 % (09/29 0815) FiO2 (%):  [28 %-40 %] 40 % (09/29 0815) Weight:  [50.9 kg (112 lb 3.4 oz)] 50.9 kg (112 lb 3.4 oz) (09/29 0459)  Intake/Output from previous day: 09/28 0701 - 09/29 0700 In: 775 [I.V.:600; NG/GT:175] Out: 2285 [Urine:2285]  Physical Exam:   General appearance: fatigued, no distress, slowed mentation, intubaed Lungs: Clear Heart: regular rate and rhythm, S1, S2 normal, no murmur, click, rub or gallop distant heart sounds. S4 gallop present  Abdomen: soft, non-tender; bowel sounds normal; no masses, no organomegaly  Extremities: extremities normal, atraumatic, no cyanosis or edema, skin is dry and wrinkled. Neurologic: Patient is lethargic, unable to follow commands at this point, but does not a few but does not localize pain   Lab Results:  Recent Labs  12/03/12 0422 12/04/12 0439  WBC 7.8 7.9  HGB 12.9 14.0  PLT 133* 154    Recent Labs  12/03/12 0422 12/04/12 0439  NA 150* 154*  K 4.5 5.1  CL 116* 116*  CO2 27 23  GLUCOSE 188* 193*  BUN 38* 41*  CREATININE 1.37* 1.36*  Lipid Panel     Component Value Date/Time   CHOL 149 05/29/2012 0657   TRIG 59 05/29/2012 0657   HDL 87 05/29/2012 0657   CHOLHDL 1.7 05/29/2012 0657   VLDL 12 05/29/2012 0657   LDLCALC 50 05/29/2012 0657   Tele: NSR  Assessment/Plan:  1. Hypertensive urgency with pulmonary edema 2. Respiratory failure 3. PAD 4.  Uncontrolled DM 5. Acute on chronic renal insufficiecy. Hyperkalemia due to hemolysis.   Recommendation: I have added Cardizem IV, for blood pressure control. Patient's confusional status may be contributing to her respiratory distress. Patient is able to move all 4 extremities, she does have mild weakness of the right upper extremity which is old. Clinically do not suspect stroke, however this cannot be completely excluded. At this point we'll continue conservative management. I may consider addition of minoxidil once her mental status improves. Patient has not received any significant sedation to explain her lethargy, however she has been lethargic since she's been in the hospital. Continue hydralazine IV and also metoprolol IV. Patient was managed for hypertension last night by the CCM.   Pamella Pert, M.D. 12/04/2012, 8:31 AM [facility] Pager: [phone] Office: [phone] If no answer: [phone]

## 2012-12-04 NOTE — Progress Notes (Signed)
PULMONARY  / CRITICAL CARE MEDICINE  Name: Carmen [name] MRN: [id number] DOB: 01-19-1934    ADMISSION DATE:  11/30/2012 CONSULTATION DATE:  11/30/2012  REFERRING MD :  Joya Martyr PRIMARY SERVICE: PCCM  CHIEF COMPLAINT:  Hypertensive   BRIEF PATIENT DESCRIPTION: 33 y/ female with hypertension and DM2 was admitted on 9/25 with hypertensive emergency causing pulmonary edema and acute respiratory failure.  SIGNIFICANT EVENTS / STUDIES:  9/25 admission  LINES / TUBES: 9/25 ETT >> 9/28 9/25 R IJ CVL >>  CULTURES: Blood 9/26 >>  Urine 9/27 >>   ANTIBIOTICS: None  SUBJECTIVE:  Extubated 9/28, more resp distress in setting HTN (unable to take her PO meds after GT removed)  VITAL SIGNS: Temp:  [99 F (37.2 C)-100.3 F (37.9 C)] 99 F (37.2 C) (09/29 1200) Pulse Rate:  [70-102] 72 (09/29 1200) Resp:  [18-35] 25 (09/29 1200) BP: (162-238)/(34-64) 191/48 mmHg (09/29 1200) SpO2:  [93 %-100 %] 100 % (09/29 1200) FiO2 (%):  [28 %-40 %] 40 % (09/29 0900) Weight:  [50.9 kg (112 lb 3.4 oz)] 50.9 kg (112 lb 3.4 oz) (09/29 0459) HEMODYNAMICS:   VENTILATOR SETTINGS: Vent Mode:  [-] BIPAP FiO2 (%):  [28 %-40 %] 40 % Set Rate:  [14 bmp] 14 bmp INTAKE / OUTPUT: Intake/Output     09/28 0701 - 09/29 0700 09/29 0701 - 09/30 0700   I.V. (mL/kg) 600 (11.8) 136.3 (2.7)   NG/GT 175    Total Intake(mL/kg) 775 (15.2) 136.3 (2.7)   Urine (mL/kg/hr) 2285 (1.9) 560 (2.1)   Total Output 2285 560   Net -1510 -423.8         PHYSICAL EXAMINATION:  Gen: on vent, alert and interactive this AM. HEENT: NCAT, PERRL ETT in place PULM: crackles bilaterally CV: RRR, no mgr, no JVD AB: BS+, soft, nontender, no hsm Ext: warm, trace edema, no clubbing, no cyanosis Derm: no rash or skin breakdown Neuro: sedated on vent  LABS:  CBC Recent Labs     12/02/12  0500  12/03/12  0422  12/04/12  0439  WBC  7.1  7.8  7.9  HGB  12.6  12.9  14.0  HCT  36.3  37.1  41.4  PLT  133*  133*  154    Coag's No results found for this basename: APTT, INR,  in the last 72 hours BMET Recent Labs     12/02/12  0500  12/03/12  0422  12/04/12  0439  NA  148*  150*  154*  K  3.2*  4.5  5.1  CL  111  116*  116*  CO2  28  27  23   BUN  33*  38*  41*  CREATININE  1.49*  1.37*  1.36*  GLUCOSE  164*  188*  193*   Electrolytes Recent Labs     12/02/12  0500  12/03/12  0422  12/04/12  0439  CALCIUM  8.4  8.6  8.8  MG  2.1  2.2  2.2  PHOS  3.5  3.1  4.6   Sepsis Markers No results found for this basename: LACTICACIDVEN, PROCALCITON, O2SATVEN,  in the last 72 hours ABG Recent Labs     12/03/12  0340  PHART  7.443  PCO2ART  38.2  PO2ART  119.0*   Liver Enzymes No results found for this basename: AST, ALT, ALKPHOS, BILITOT, ALBUMIN,  in the last 72 hours Cardiac Enzymes No results found for this basename: TROPONINI, PROBNP,  in the last 72 hours  Glucose Recent Labs     12/03/12  1444  12/03/12  1615  12/03/12  1958  12/03/12  2322  12/04/12  0430  12/04/12  0801  GLUCAP  257*  187*  173*  125*  174*  147*    Imaging Dg Chest Port 1 View  12/03/2012   CLINICAL DATA:  Intubation, evaluate endotracheal tube placement, history hypertension, diabetes, coronary artery disease, stroke  EXAM: PORTABLE CHEST - 1 VIEW  COMPARISON:  Portable exam 0759 hr compared to 12/02/2012  FINDINGS: Tip of endotracheal tube in the left mainstem bronchus; recommend withdrawal 3-4 cm.  Left subclavian transvenous pacemaker leads project over right atrium and right ventricle.  Nasogastric tube extends into stomach.  Enlargement of cardiac silhouette with pulmonary vascular congestion.  Perihilar infiltrates compatible with pulmonary edema and CHF, little changed.  Question accompanying atelectasis in left lower lobe.  Tip of right jugular line projects over SVC.  No definite pleural effusion or pneumothorax.  IMPRESSION: Persistent CHF.  Left mainstem bronchus intubation; recommend withdrawal of  endotracheal tube 3-4 cm.  Findings called to [place] RN on 2H on 12/03/2012 at 0925 hr.   Electronically Signed   By: Ulyses Southward M.D.   On: 12/03/2012 09:26   CXR: 9/28 bilateral pulmonary edema, ETT in place, pacer  ASSESSMENT / PLAN:  PULMONARY A: Acute hypercarbic and hypoxemic respiratory failure, recurrent pm 9/28 P:    - BiPAP prn  - Lasix x4 doses - PRN albuterol. - See cards.  CARDIOVASCULAR A: Hypertensive emergency P:  - BP control ordered - Diuresis as ordered. - F/u cardiology recs.  RENAL A:  S/p renal artery stenting in past Hypokalemia, uncertain how much replacement given in ED hypernatremia P:   - Monitor UOP. - Lasix 40 mg IV x4 doses. - Replace electrolytes as needed. - start free water.   GASTROINTESTINAL A:  No acute issues P:   - OG tube. - Hold TF for extubation. - Pantoprazole for stress ulcer prophylaxis.  HEMATOLOGIC A:  No acute issues P:  - Monitor for bleeding.  INFECTIOUS A:  No acute issues P:   - Monitor for fever.  ENDOCRINE A: DM2 P:   - SSI.  NEUROLOGIC A:  hypersomnolence P:    TODAY'S SUMMARY: Diuresis, BP control, BiPAP prn  I have personally obtained a history, examined the patient, evaluated laboratory and imaging results, formulated the assessment and plan and placed orders.  CRITICAL CARE: The patient is critically ill with multiple organ systems failure and requires high complexity decision making for assessment and support, frequent evaluation and titration of therapies, application of advanced monitoring technologies and extensive interpretation of multiple databases. Critical Care Time devoted to patient care services described in this note is 35 minutes.   Levy Pupa, MD, PhD 12/04/2012, 12:49 PM [facility] [phone] or if no answer [phone]

## 2012-12-04 NOTE — Progress Notes (Signed)
PT Cancellation Note  Patient Details Name: Carmen [name] MRN: [id number] DOB: 1933-05-09   Cancelled Treatment:    Reason Eval/Treat Not Completed: Medical issues which prohibited therapy. RN deferred at this time due to elevated BP and pt having to go back on BiPap at this time. PT to return as able.   Marcene Brawn 12/04/2012, 7:45 AM

## 2012-12-05 ENCOUNTER — Inpatient Hospital Stay (HOSPITAL_COMMUNITY): Payer: Medicare Other

## 2012-12-05 LAB — CBC
HCT: 38.4 % (ref 36.0–46.0)
Hemoglobin: 13 g/dL (ref 12.0–15.0)
MCH: 34.1 pg — ABNORMAL HIGH (ref 26.0–34.0)
MCHC: 33.9 g/dL (ref 30.0–36.0)
MCV: 100.8 fL — ABNORMAL HIGH (ref 78.0–100.0)
RBC: 3.81 MIL/uL — ABNORMAL LOW (ref 3.87–5.11)
WBC: 6.1 10*3/uL (ref 4.0–10.5)

## 2012-12-05 LAB — MAGNESIUM: Magnesium: 2.1 mg/dL (ref 1.5–2.5)

## 2012-12-05 LAB — BASIC METABOLIC PANEL
BUN: 42 mg/dL — ABNORMAL HIGH (ref 6–23)
CO2: 30 mEq/L (ref 19–32)
Calcium: 8.9 mg/dL (ref 8.4–10.5)
Chloride: 120 mEq/L — ABNORMAL HIGH (ref 96–112)
Creatinine, Ser: 1.26 mg/dL — ABNORMAL HIGH (ref 0.50–1.10)
Potassium: 3.8 mEq/L (ref 3.5–5.1)

## 2012-12-05 LAB — GLUCOSE, CAPILLARY
Glucose-Capillary: 177 mg/dL — ABNORMAL HIGH (ref 70–99)
Glucose-Capillary: 143 mg/dL — ABNORMAL HIGH (ref 70–99)
Glucose-Capillary: 198 mg/dL — ABNORMAL HIGH (ref 70–99)

## 2012-12-05 MED ORDER — DILTIAZEM HCL 30 MG PO TABS
30.0000 mg | ORAL_TABLET | Freq: Four times a day (QID) | ORAL | Status: DC
  Administered 2012-12-05 – 2012-12-07 (×8): 30 mg via ORAL
  Filled 2012-12-05 (×12): qty 1

## 2012-12-05 MED ORDER — FUROSEMIDE 40 MG PO TABS
40.0000 mg | ORAL_TABLET | Freq: Every day | ORAL | Status: DC
  Administered 2012-12-06: 40 mg via ORAL
  Filled 2012-12-05: qty 1

## 2012-12-05 MED ORDER — FREE WATER: 300.0000 mL | Status: DC

## 2012-12-05 NOTE — Evaluation (Signed)
Clinical/Bedside Swallow Evaluation Patient Details  Name: Carmen [name] MRN: [id number] Date of Birth: 04-20-1933  Today's Date: 12/05/2012 Time: 0272-5366 SLP Time Calculation (min): 32 min  Past Medical History:  Past Medical History  Diagnosis Date  . Diabetes mellitus   . Hypertension   . Coronary artery disease   . Renal disorder   . Herniated lumbar intervertebral disc   . Cataract     cataract surgery scheduled for 03/31/11, 2 - left eye, 1 - right eye  . Renovascular hypertension      s/p left renal artery stent 12/2007.  S/P balloon angioplasty on 02/16/10 for ISR, BP was  controlled well since then.     . Claudication in peripheral vascular disease     02/16/10: Left CIA 9.0x28 Omnilink and REIA 8.0x40 seff expanding [place].   right subclavian artery stent 03/18/2008,  . S/P carotid endarterectomy      left carotid endarterectomy  1991; Stroke and TIA in 1999 with right sided weakness, now with residual right arm weakness  . Stroke     TIA  . Peripheral vascular disease   . Anxiety    Past Surgical History:  Past Surgical History  Procedure Laterality Date  . Appendectomy    . Abdominal hysterectomy    . Ureteral stent placement    . Carotid endarterectomy    . Laminectomy    . Back surgery    . Colonoscopy  07/30/2011    Procedure: COLONOSCOPY;  Surgeon: Louis Meckel, MD;  Location: [facility] ENDOSCOPY;  Service: Endoscopy;  Laterality: N/A;  gi bleed  . Esophagogastroduodenoscopy  07/30/2011    Procedure: ESOPHAGOGASTRODUODENOSCOPY (EGD);  Surgeon: Louis Meckel, MD;  Location: [facility] ENDOSCOPY;  Service: Endoscopy;  Laterality: N/A;  . Pacemaker insertion     HPI:  89 y/ female with hypertension and DM2, hx left CVA with residual right-sided weakness (1999)  was admitted on 9/25 with hypertensive emergency causing pulmonary edema and acute respiratory failure.  Intubated  9/25-9/28; biBap yesterday. Tolerating nasal cannula today at 4 liters with 98% Sp02.        Assessment / Plan / Recommendation Clinical Impression  Pt presents with a probable transient acute dysphagia secondary to intubation and deconditioning.  Voice remains aphonic; pharyngeal weakness is likely; protection of airway with thin liquids appears inconsistent.  Coordination of respiration and swallowing compatible. Recommend initiating a dysphagia 1 diet with nectar-thick liquids, meds whole with puree.  Daughter present -discussed results and recs.  SLP to follow for readiness for diet advancement.      Aspiration Risk  Moderate    Diet Recommendation Dysphagia 1 (Puree);Nectar-thick liquid   Liquid Administration via: Cup;Straw Medication Administration: Whole meds with puree Supervision: Full supervision/cueing for compensatory strategies Compensations: Slow rate;Small sips/bites;Clear throat intermittently Postural Changes and/or Swallow Maneuvers: Seated upright 90 degrees;Upright 30-60 min after meal    Other  Recommendations Oral Care Recommendations: Oral care BID Other Recommendations: Order thickener from pharmacy   Follow Up Recommendations   (tba)    Frequency and Duration min 2x/week  1 week   Pertinent Vitals/Pain No pain    SLP Swallow Goals Patient will utilize recommended strategies during swallow to increase swallowing safety with: Modified independent assistance   Swallow Study Prior Functional Status       General Date of Onset: 11/30/12 HPI: 43 y/ female with hypertension and DM2, hx left CVA with residual right-sided weakness (1999)  was admitted on 9/25 with hypertensive emergency causing pulmonary edema  and acute respiratory failure.  Intubated  9/25-9/28; biBap yesterday. Tolerating nasal cannula today at 4 liters with 98% Sp02.     Type of Study: Bedside swallow evaluation Previous Swallow Assessment: dec 2013 with findings of normal swallow function Diet Prior to this Study: NPO Temperature Spikes Noted: No Respiratory Status:  Supplemental O2 delivered via (comment) ([place] 4l) History of Recent Intubation: Yes Length of Intubations (days): 3 days Date extubated: 12/03/12 Behavior/Cognition: Alert;Lethargic Oral Cavity - Dentition: Edentulous Self-Feeding Abilities: Needs assist Patient Positioning: Upright in chair Baseline Vocal Quality: Aphonic Volitional Cough: Weak Volitional Swallow: Able to elicit    Oral/Motor/Sensory Function Overall Oral Motor/Sensory Function: Appears within functional limits for tasks assessed   Ice Chips Ice chips: Within functional limits   Thin Liquid Thin Liquid: Impaired Presentation: Cup;Self Fed Pharyngeal  Phase Impairments: Suspected delayed Swallow;Multiple swallows;Wet Vocal Quality    Nectar Thick Nectar Thick Liquid: Impaired Presentation: Cup Pharyngeal Phase Impairments: Multiple swallows   Honey Thick Honey Thick Liquid: Not tested   Puree Puree: Impaired Presentation: Spoon Pharyngeal Phase Impairments: Suspected delayed Swallow;Decreased hyoid-laryngeal movement;Multiple swallows   Solid   GO    Solid: Not tested      Carmen [name] L. Carmen Frederic, MA CCC/SLP Pager [phone]  Carmen [name] Carmen [name] 12/05/2012,11:25 AM

## 2012-12-05 NOTE — Progress Notes (Signed)
Inpatient Diabetes Program Recommendations  AACE/ADA: New Consensus Statement on Inpatient Glycemic Control (2013)  Target Ranges:  Prepandial:   less than 140 mg/dL      Peak postprandial:   less than 180 mg/dL (1-2 hours)      Critically ill patients:  140 - 180 mg/dL   Inpatient Diabetes Program Recommendations Insulin - Basal: consider adding low dose basal Lantus or Levemir 10 units  HgbA1C: order to assess prehospital glucose control Thank you  Piedad Climes BSN, RN,CDE Inpatient Diabetes Coordinator [phone] (team pager)

## 2012-12-05 NOTE — Clinical Social Work Note (Signed)
Patient was lethargic when CSW visited, and CSW had significant difficulty understanding patient due to quiet speech. Patient nodded when CSW asked if she could contact family members and speak with them about patient care. CSW called patient's husband, Fayrene Fearing, and left him a message explaining that PT is recommending SNF; CSW requested that Fayrene Fearing give her a call back to talk about discharge planning. CSW will complete psychosocial assessment upon talking with Fayrene Fearing.  Maryclare Labrador, MSW, [facility] Clinical Social Worker [phone]

## 2012-12-05 NOTE — Progress Notes (Signed)
Subjective:  Patient more alert and responds appropriately. No chest pain and states her breathing is much better Objective:  Vital Signs in the last 24 hours: Temp:  [98.4 F (36.9 C)-99 F (37.2 C)] 98.9 F (37.2 C) (09/30 0826) Pulse Rate:  [56-88] 59 (09/30 0826) Resp:  [19-33] 28 (09/30 0826) BP: (110-191)/(35-146) 143/48 mmHg (09/30 0826) SpO2:  [89 %-100 %] 92 % (09/30 0826) Weight:  [50.3 kg (110 lb 14.3 oz)] 50.3 kg (110 lb 14.3 oz) (09/30 0400)  Intake/Output from previous day: 09/29 0701 - 09/30 0700 In: 1001.3 [I.V.:1001.3] Out: 2135 [Urine:2135]  Physical Exam:   General appearance: still looks fatigued, no distress, slowed mentation,  Lungs: Clear Heart: regular rate and rhythm, S1, S2 normal, no murmur, click, rub or gallop distant heart sounds. S4 gallop present  Abdomen: soft, non-tender; bowel sounds normal; no masses, no organomegaly  Extremities: extremities normal, atraumatic, no cyanosis or edema, skin is dry and wrinkled. Neurologic: alert and ox3 no focal neuro deficits   Lab Results:  Recent Labs  12/04/12 0439 12/05/12 0400  WBC 7.9 6.1  HGB 14.0 13.0  PLT 154 150    Recent Labs  12/04/12 0439 12/05/12 0400  NA 154* 156*  K 5.1 3.8  CL 116* 120*  CO2 23 30  GLUCOSE 193* 148*  BUN 41* 42*  CREATININE 1.36* 1.26*  Lipid Panel     Component Value Date/Time   CHOL 149 05/29/2012 0657   TRIG 59 05/29/2012 0657   HDL 87 05/29/2012 0657   CHOLHDL 1.7 05/29/2012 0657   VLDL 12 05/29/2012 0657   LDLCALC 50 05/29/2012 0657   Tele: NSR  Assessment/Plan:  1. Hypertensive urgency with pulmonary edema, resolved 2. Hypernatremia due to dehydration from excessive diuresis. 3. Respiratory failure 4. PAD 5. Uncontrolled DM 6. Acute on chronic renal insufficiecy. Hyperkalemia due to hemolysis.   Recommendation: Discussed with nursing and if patient is able to swallow, switch to cardizem CD 180 mg BID and change lasix to PO starting tomorrow  with no additional doses for today. F/U electrolytes. Als Saturations are 90% and patient does not appear to be in respiratory distress. Suspect deconditioning and fatigue factor. Should improve over next 24 hours. Potentially transfer to step down today  Pamella Pert, M.D. 12/05/2012, 9:03 AM [facility] Pager: [phone] Office: [phone] If no answer: [phone]

## 2012-12-05 NOTE — Evaluation (Signed)
Physical Therapy Evaluation Patient Details Name: Carmen [name] MRN: [id number] DOB: Jul 04, 1933 Today's Date: 12/05/2012 Time: 7829-5621 PT Time Calculation (min): 34 min  PT Assessment / Plan / Recommendation History of Present Illness  79 y.o. female with PMH of Chronic diastolic CHF, DM, Poorly controlled HTN is brought to the ER by her grand daughter today who she has been staying with since Thursday..  Clinical Impression  Pt severely deconditioned requiring maxA for all mobility and ADLs at this time. Pt to benefit from ST-SNF to address mentioned deficits and achieve maximal functional recovery for safe transition back home with spouse.     PT Assessment  Patient needs continued PT services    Follow Up Recommendations  SNF;Supervision/Assistance - 24 hour    Does the patient have the potential to tolerate intense rehabilitation      Barriers to Discharge Decreased caregiver support suspect spouse unable to physically assist pt at this current level    Equipment Recommendations   (TBD)    Recommendations for Other Services     Frequency Min 3X/week    Precautions / Restrictions Precautions Precautions: Fall Precaution Comments: on 3 LO2 via [place] Restrictions Weight Bearing Restrictions: No   Pertinent Vitals/Pain Denies pain      Mobility  Bed Mobility Bed Mobility: Supine to Sit;Sitting - Scoot to Edge of Bed Supine to Sit: 1: +2 Total assist;HOB elevated Supine to Sit: Patient Percentage: 40% Sitting - Scoot to Edge of Bed: 2: Max assist Details for Bed Mobility Assistance: pt with retropulsion. pt able to move LEs to EOB however maxA for trunk elevation Transfers Transfers: Sit to Stand;Stand to Sit;Stand Pivot Transfers Sit to Stand: 1: +2 Total assist;With upper extremity assist;From bed Sit to Stand: Patient Percentage: 50% Stand to Sit: 1: +2 Total assist;With upper extremity assist;To chair/3-in-1 Stand to Sit: Patient Percentage: 50% Stand Pivot  Transfers: 1: +2 Total assist Stand Pivot Transfers: Patient Percentage: 50% Details for Transfer Assistance: max v/c's to step, increased bilat knee and hip flexion, inc RR, SpO2 at >89 on 3 LO2 via [place] Ambulation/Gait Ambulation/Gait Assistance: Not tested (comment)    Exercises     PT Diagnosis: Difficulty walking;Generalized weakness  PT Problem List: Decreased strength;Decreased activity tolerance;Decreased balance;Decreased mobility;Decreased coordination;Decreased knowledge of use of DME;Cardiopulmonary status limiting activity PT Treatment Interventions: DME instruction;Gait training;Stair training;Functional mobility training;Therapeutic activities;Therapeutic exercise     PT Goals(Current goals can be found in the care plan section) Acute Rehab PT Goals Patient Stated Goal: home but agreeable to rehab PT Goal Formulation: With patient Time For Goal Achievement: 12/12/12 Potential to Achieve Goals: Good  Visit Information  Last PT Received On: 12/05/12 Assistance Needed: +2 History of Present Illness: 79 y.o. female with PMH of Chronic diastolic CHF, DM, Poorly controlled HTN is brought to the ER by her grand daughter today who she has been staying with since Thursday..       Prior Functioning  Home Living Family/patient expects to be discharged to:: Skilled nursing facility Additional Comments: was living at home with spouse, walked indep without AD. family did assist with bathing/dressing occasionally Prior Function Level of Independence: Needs assistance Gait / Transfers Assistance Needed: walked without assist or AD ADL's / Homemaking Assistance Needed: occasional assist for dressing/bathing. pt occasionally cooks Communication Communication:  (soft voice) Dominant Hand: Left    Cognition  Cognition Arousal/Alertness: Awake/alert Behavior During Therapy: WFL for tasks assessed/performed Overall Cognitive Status: Within Functional Limits for tasks assessed     Extremity/Trunk Assessment  Upper Extremity Assessment Upper Extremity Assessment: RUE deficits/detail RUE Deficits / Details: residual weakness from previous stroke, R elbow flex contracture. grip 3-/5 Lower Extremity Assessment Lower Extremity Assessment: Generalized weakness;RLE deficits/detail RLE Deficits / Details: grossly 3-/5, residual weekness from previous stroke Cervical / Trunk Assessment Cervical / Trunk Assessment: Kyphotic   Balance Balance Balance Assessed: Yes Static Sitting Balance Static Sitting - Balance Support: Left upper extremity supported Static Sitting - Level of Assistance: 3: Mod assist Static Sitting - Comment/# of Minutes: 5 min  End of Session PT - End of Session Equipment Utilized During Treatment: Gait belt;Oxygen (3Lo2 via [place]) Activity Tolerance: Patient limited by fatigue Patient left: in chair;with call bell/phone within reach Nurse Communication: Mobility status  GP     Marcene Brawn 12/05/2012, 11:18 AM  Lewis Shock, PT, DPT Pager #: [phone] Office #: [phone]

## 2012-12-05 NOTE — Progress Notes (Signed)
Daughter brought to my attention that her mother had a knot on the back of her head from the fall she had prior to admission to the hospital.  I assessed the back left side of her head.  No abrasion/laceration noted.  Nickel sized knot/raised area noted.  Patient only complained of soreness. No headache, visual or speech deficits noted.  Will continue to monitor patient for any significant changes.

## 2012-12-05 NOTE — Progress Notes (Signed)
PULMONARY  / CRITICAL CARE MEDICINE  Name: Carmen [name] MRN: [id number] DOB: 1933-09-16    ADMISSION DATE:  11/30/2012 CONSULTATION DATE:  11/30/2012  REFERRING MD :  Joya Martyr PRIMARY SERVICE: PCCM  CHIEF COMPLAINT:  Hypertensive   BRIEF PATIENT DESCRIPTION: 83 y/ female with hypertension and DM2 was admitted on 9/25 with hypertensive emergency causing pulmonary edema and acute respiratory failure.  SIGNIFICANT EVENTS / STUDIES:  9/25 admission  LINES / TUBES: 9/25 ETT >> 9/28 9/25 R IJ CVL >>  CULTURES: Blood 9/26 >>  Urine 9/27 >> negative  ANTIBIOTICS: None  SUBJECTIVE:  Up in chair, very weak, wants to eat  VITAL SIGNS: Temp:  [97.4 F (36.3 C)-99 F (37.2 C)] 97.4 F (36.3 C) (09/30 1108) Pulse Rate:  [55-74] 59 (09/30 1200) Resp:  [19-31] 21 (09/30 1200) BP: (110-186)/(35-146) 130/43 mmHg (09/30 1304) SpO2:  [89 %-100 %] 99 % (09/30 1200) Weight:  [50.3 kg (110 lb 14.3 oz)] 50.3 kg (110 lb 14.3 oz) (09/30 0400) HEMODYNAMICS:   VENTILATOR SETTINGS:   INTAKE / OUTPUT: Intake/Output     09/29 0701 - 09/30 0700 09/30 0701 - 10/01 0700   I.V. (mL/kg) 1046.3 (20.8) 205 (4.1)   NG/GT     Total Intake(mL/kg) 1046.3 (20.8) 205 (4.1)   Urine (mL/kg/hr) 2135 (1.8) 495 (1.6)   Total Output 2135 495   Net -1088.8 -290         PHYSICAL EXAMINATION:  Gen: up to chair, alert and interactive this AM.  HEENT: NCAT, PERRL PULM: crackles bilaterally CV: RRR, no mgr, no JVD AB: BS+, soft, nontender, no hsm Ext: warm, trace edema, no clubbing, no cyanosis Derm: no rash or skin breakdown Neuro: globally weak, soft voice, moves all ext  LABS:  CBC Recent Labs     12/03/12  0422  12/04/12  0439  12/05/12  0400  WBC  7.8  7.9  6.1  HGB  12.9  14.0  13.0  HCT  37.1  41.4  38.4  PLT  133*  154  150   Coag's No results found for this basename: APTT, INR,  in the last 72 hours BMET Recent Labs     12/03/12  0422  12/04/12  0439  12/05/12  0400  NA  150*   154*  156*  K  4.5  5.1  3.8  CL  116*  116*  120*  CO2  27  23  30   BUN  38*  41*  42*  CREATININE  1.37*  1.36*  1.26*  GLUCOSE  188*  193*  148*   Electrolytes Recent Labs     12/03/12  0422  12/04/12  0439  12/05/12  0400  CALCIUM  8.6  8.8  8.9  MG  2.2  2.2  2.1  PHOS  3.1  4.6  3.0   Sepsis Markers No results found for this basename: LACTICACIDVEN, PROCALCITON, O2SATVEN,  in the last 72 hours ABG Recent Labs     12/03/12  0340  PHART  7.443  PCO2ART  38.2  PO2ART  119.0*   Liver Enzymes No results found for this basename: AST, ALT, ALKPHOS, BILITOT, ALBUMIN,  in the last 72 hours Cardiac Enzymes No results found for this basename: TROPONINI, PROBNP,  in the last 72 hours Glucose Recent Labs     12/04/12  1220  12/04/12  1723  12/04/12  2049  12/05/12  0022  12/05/12  0411  12/05/12  1111  GLUCAP  177*  263*  211*  168*  143*  198*    Imaging Dg Chest Port 1 View  12/05/2012   CLINICAL DATA:  Cough, shortness of Breath.  EXAM: PORTABLE CHEST - 1 VIEW  COMPARISON:  12/03/2012  FINDINGS: Support devices are stable. Cardiomegaly. Bibasilar atelectasis or infiltrates have increased since prior study. Vascular congestion. Question small effusions.  IMPRESSION: Increasing bibasilar atelectasis or infiltrates. Suspect small effusions.   Electronically Signed   By: Charlett Nose M.D.   On: 12/05/2012 05:48   CXR: 9/30  bilateral pulmonary edema, increased effusions  ASSESSMENT / PLAN:  PULMONARY A: Acute hypercarbic and hypoxemic respiratory failure, recurrent pm 9/28. No BiPAP required o/n 9/29 P:    - BiPAP prn - Lasix > changed to PO 10/1 - PRN albuterol. - See cards.  CARDIOVASCULAR A: Hypertensive emergency P:  - BP control ordered, goal convert IV dilt to Po dilt (this is the agent that has worked for her in the past) - Diuresis as ordered. - F/u cardiology recs.  RENAL A:  S/p renal artery stenting in past Hypokalemia,  hypernatremia P:   -  Monitor UOP. - Lasix daily - Replace electrolytes as needed. - increase free water 9/30  GASTROINTESTINAL A:  No acute issues P:  - Pantoprazole for stress ulcer prophylaxis. - PO diet when she can pass swallow eval  HEMATOLOGIC A:  No acute issues P:  - Monitor for bleeding.  INFECTIOUS A:  No acute issues P:   - Monitor for fever.  ENDOCRINE A: DM2 P:   - SSI.  NEUROLOGIC A:  hypersomnolence P:    TODAY'S SUMMARY: Diuresis, BP control, BiPAP prn  I have personally obtained a history, examined the patient, evaluated laboratory and imaging results, formulated the assessment and plan and placed orders.  Levy Pupa, MD, PhD 12/05/2012, 1:27 PM [facility] [phone] or if no answer [phone]

## 2012-12-06 LAB — COMPREHENSIVE METABOLIC PANEL
Albumin: 2.3 g/dL — ABNORMAL LOW (ref 3.5–5.2)
BUN: 44 mg/dL — ABNORMAL HIGH (ref 6–23)
CO2: 27 mEq/L (ref 19–32)
Calcium: 8.8 mg/dL (ref 8.4–10.5)
Creatinine, Ser: 1.27 mg/dL — ABNORMAL HIGH (ref 0.50–1.10)
Potassium: 3.9 mEq/L (ref 3.5–5.1)
Total Protein: 6.1 g/dL (ref 6.0–8.3)

## 2012-12-06 LAB — GLUCOSE, CAPILLARY
Glucose-Capillary: 211 mg/dL — ABNORMAL HIGH (ref 70–99)
Glucose-Capillary: 357 mg/dL — ABNORMAL HIGH (ref 70–99)
Glucose-Capillary: 174 mg/dL — ABNORMAL HIGH (ref 70–99)
Glucose-Capillary: 151 mg/dL — ABNORMAL HIGH (ref 70–99)
Glucose-Capillary: 508 mg/dL — ABNORMAL HIGH (ref 70–99)

## 2012-12-06 LAB — BASIC METABOLIC PANEL
CO2: 26 mEq/L (ref 19–32)
Calcium: 8.2 mg/dL — ABNORMAL LOW (ref 8.4–10.5)
Chloride: 112 mEq/L (ref 96–112)
Creatinine, Ser: 1.18 mg/dL — ABNORMAL HIGH (ref 0.50–1.10)
GFR calc non Af Amer: 43 mL/min — ABNORMAL LOW (ref >90)
Glucose, Bld: 594 mg/dL (ref 70–99)
Potassium: 3.9 mEq/L (ref 3.5–5.1)
Sodium: 146 mEq/L — ABNORMAL HIGH (ref 135–145)

## 2012-12-06 LAB — CBC
HCT: 39.6 % (ref 36.0–46.0)
Hemoglobin: 13 g/dL (ref 12.0–15.0)
MCHC: 32.8 g/dL (ref 30.0–36.0)
MCV: 101.5 fL — ABNORMAL HIGH (ref 78.0–100.0)
Platelets: 158 10*3/uL (ref 150–400)
RBC: 3.9 MIL/uL (ref 3.87–5.11)
WBC: 4.4 10*3/uL (ref 4.0–10.5)

## 2012-12-06 MED ORDER — ENSURE PUDDING PO PUDG
1.0000 | Freq: Every day | ORAL | Status: DC
  Administered 2012-12-07 – 2012-12-12 (×6): 1 via ORAL

## 2012-12-06 MED ORDER — METOPROLOL TARTRATE 100 MG PO TABS
100.0000 mg | ORAL_TABLET | Freq: Two times a day (BID) | ORAL | Status: DC
  Administered 2012-12-06 – 2012-12-12 (×13): 100 mg via ORAL
  Filled 2012-12-06 (×15): qty 1

## 2012-12-06 MED ORDER — DEXTROSE 5 % IV SOLN
INTRAVENOUS | Status: DC
  Administered 2012-12-06 (×2): via INTRAVENOUS

## 2012-12-06 MED ORDER — INSULIN GLARGINE 100 UNIT/ML ~~LOC~~ SOLN
10.0000 [IU] | Freq: Every day | SUBCUTANEOUS | Status: DC
  Administered 2012-12-06: 10 [IU] via SUBCUTANEOUS
  Filled 2012-12-06: qty 0.1

## 2012-12-06 MED ORDER — PANTOPRAZOLE SODIUM 40 MG PO TBEC
40.0000 mg | DELAYED_RELEASE_TABLET | Freq: Every day | ORAL | Status: DC
  Administered 2012-12-07 – 2012-12-12 (×6): 40 mg via ORAL
  Filled 2012-12-06 (×7): qty 1

## 2012-12-06 MED ORDER — INSULIN ASPART 100 UNIT/ML ~~LOC~~ SOLN
0.0000 [IU] | SUBCUTANEOUS | Status: DC
  Administered 2012-12-06: 20 [IU] via SUBCUTANEOUS

## 2012-12-06 MED ORDER — RESOURCE THICKENUP CLEAR PO POWD
ORAL | Status: DC | PRN
  Filled 2012-12-06: qty 125

## 2012-12-06 NOTE — Progress Notes (Signed)
Speech Language Pathology Dysphagia Treatment Patient Details Name: Carmen [name] MRN: [id number] DOB: [birth year] Today's Date: 12/06/2012 Time: 4098-1191 SLP Time Calculation (min): 20 min  Assessment / Plan / Recommendation Clinical Impression  Pt demonstrates improved swallow function today - MS is clearer, phonation is stronger, and pt consumed breakfast meal with good motivation and intake.  Appears to be tolerating Dysphagia 1, nectar-thick liquid diet without airway compromise per observation.  Recommend continuing current diet; will follow.     Diet Recommendation  Continue with Current Diet: Dysphagia 1 (puree);Nectar-thick liquid    SLP Plan Continue with current plan of care   Pertinent Vitals/Pain No pain   Swallowing Goals  SLP Swallowing Goals Patient will utilize recommended strategies during swallow to increase swallowing safety with: Modified independent assistance Swallow Study Goal #2 - Progress: Progressing toward goal  General Temperature Spikes Noted: No Respiratory Status: Supplemental O2 delivered via (comment) (2 l nasal cannula) Behavior/Cognition: Alert;Cooperative;Pleasant mood Oral Cavity - Dentition: Edentulous Patient Positioning: Upright in bed  Oral Cavity - Oral Hygiene     Dysphagia Treatment Treatment focused on: Skilled observation of diet tolerance Treatment Methods/Modalities: Skilled observation Patient observed directly with PO's: Yes Type of PO's observed: Dysphagia 1 (puree);Nectar-thick liquids Feeding: Needs assist Liquids provided via: Cup Pharyngeal Phase Signs & Symptoms: Suspected delayed swallow initiation;Multiple swallows Type of cueing: Verbal Amount of cueing: Minimal   GO     Carolan Shiver 12/06/2012, 3:36 PM

## 2012-12-06 NOTE — Clinical Social Work Placement (Addendum)
Clinical Social Work Department CLINICAL SOCIAL WORK PLACEMENT NOTE 12/06/2012  Patient:  [name], [name]  Account Number:  [id number] Admit date:  11/30/2012  Clinical Social Worker:  Maryclare Labrador, Carmen Majors  Date/time:  12/06/2012 03:28 PM  Clinical Social Work is seeking post-discharge placement for this patient at the following level of care:   SKILLED NURSING   (*CSW will update this form in Epic as items are completed)   12/07/12  Patient/family provided with [facility] Department of Clinical Social Work's list of facilities offering this level of care within the geographic area requested by the patient (or if unable, by the patient's family).  12/06/2012  Patient/family informed of their freedom to choose among providers that offer the needed level of care, that participate in Medicare, Medicaid or managed care program needed by the patient, have an available bed and are willing to accept the patient.  12/07/12  Patient/family informed of [facility]' ownership interest in [facility], as well as of the fact that they are under no obligation to receive care at this facility.  PASARR submitted to EDS on 12/06/2012 PASARR number received from EDS on 12/06/2012  FL2 transmitted to all facilities in geographic area requested by pt/family on  12/06/2012 FL2 transmitted to all facilities within larger geographic area on   Patient informed that his/her managed care company has contracts with or will negotiate with  certain facilities, including the following:     Patient/family informed of bed offers received: 12/07/12  Patient chooses bed at [facility] Physician recommends and patient chooses bed at    Patient to be transferred to  on   Patient to be transferred to facility by   The following physician request were entered in Epic:   Additional Comments:    Maryclare Labrador, MSW, [facility] Clinical Social Worker [phone]

## 2012-12-06 NOTE — Progress Notes (Signed)
eLink Physician-Brief Progress Note Patient Name: Carmen [name] DOB: 14-Nov-1933 MRN: [id number]  Date of Service  12/06/2012   HPI/Events of Note   Diabetic -CBGs high - no coverage On d5W for hyperNa  eICU Interventions  Dc lasix- na rising, dc D5w - now taking PO SSI -resistant scale Chk BMET   Intervention Category Major Interventions: Hyperglycemia - active titration of insulin therapy  Joseph Johns V. 12/06/2012, 10:20 PM

## 2012-12-06 NOTE — Progress Notes (Signed)
Subjective:  Patient more alert and responds appropriately. No chest pain and states her breathing is much better. BSBP in 150-170 range. Good UOP. No specific complaints and daughter present at bedside and patient states that the whole problem arose after she took increased dose of insulin on the day of admission. Daughter states no power last 2 days due to financial issues. Objective:  Vital Signs in the last 24 hours: Temp:  [97.3 F (36.3 C)-97.9 F (36.6 C)] 97.9 F (36.6 C) (10/01 1947) Pulse Rate:  [59-67] 61 (10/01 1930) Resp:  [12-25] 25 (10/01 1930) BP: (122-224)/(36-114) 198/62 mmHg (10/01 1930) SpO2:  [92 %-100 %] 98 % (10/01 2108) Weight:  [51 kg (112 lb 7 oz)] 51 kg (112 lb 7 oz) (10/01 0500)  Intake/Output from previous day: 09/30 0701 - 10/01 0700 In: 804.8 [P.O.:525; I.V.:279.8] Out: 1045 [Urine:1045]  Physical Exam:   General appearance: still looks fatigued but much improved since yesterday, no distress, slowed mentation,  Lungs: Clear Heart: regular rate and rhythm, S1, S2 normal, no murmur, click, rub or gallop distant heart sounds. S4 gallop present  Abdomen: soft, non-tender; bowel sounds normal; no masses, no organomegaly  Extremities: extremities normal, atraumatic, no cyanosis or edema, skin is dry and wrinkled. Neurologic: alert and ox3 no focal neuro deficits   Lab Results:  Recent Labs  12/05/12 0400 12/06/12 0420  WBC 6.1 4.4  HGB 13.0 13.0  PLT 150 158    Recent Labs  12/05/12 0400 12/06/12 0420  NA 156* 156*  K 3.8 3.9  CL 120* 121*  CO2 30 27  GLUCOSE 148* 192*  BUN 42* 44*  CREATININE 1.26* 1.27*  Lipid Panel     Component Value Date/Time   CHOL 149 05/29/2012 0657   TRIG 59 05/29/2012 0657   HDL 87 05/29/2012 0657   CHOLHDL 1.7 05/29/2012 0657   VLDL 12 05/29/2012 0657   LDLCALC 50 05/29/2012 0657   Tele: NSR  Assessment/Plan:  1. Hypertensive urgency with pulmonary edema, resolved 2. Hypernatremia due to dehydration from  excessive diuresis. 3. Respiratory failure 4. PAD 5. Uncontrolled DM 6. Acute on chronic renal insufficiecy. Hyperkalemia due to hemolysis.   Recommendation: Continue present medications and will transition to long acting cardizem in the morning. Compliance has been the main issue. Potentially transfer to step down today.  Pamella Pert, M.D. 12/06/2012, 9:36 PM [facility] Pager: [phone] Office: [phone] If no answer: [phone]

## 2012-12-06 NOTE — Clinical Social Work Psychosocial (Signed)
Clinical Social Work Department BRIEF PSYCHOSOCIAL ASSESSMENT 12/06/2012  Patient:  Carmen [name], Carmen [name]     Account Number:  [id number]     Admit date:  11/30/2012  Clinical Social Worker:  Varney Biles  Date/Time:  12/06/2012 02:15 PM  Referred by:  Physician  Date Referred:  12/05/2012 Referred for  SNF Placement   Other Referral:   Interview type:  Patient Other interview type:   Patient's daughter, Carmen [name], also in the room.    PSYCHOSOCIAL DATA Living Status:  FAMILY Admitted from facility:   Level of care:  Independent Living Primary support name:   Primary support relationship to patient:  CHILD, ADULT Degree of support available:   Good--pt's daughter Carmen [name] was at bedside, and patient says she also receives support from her granddaughter and son (patient was living with son before admission).    CURRENT CONCERNS Current Concerns  Post-Acute Placement   Other Concerns:    SOCIAL WORK ASSESSMENT / PLAN CSW met with pt and pt's daughter Carmen [name]. Per Debora, patient was living with her son before admission. CSW explained that medical team is recommending SNF; patient says she would like to go to SNF. CSW explained her role in discharge planning, and patient would like to be faxed out to SNFs in [place], as her family lives in [place]. CSW will fax pt clinicals to SNFs in [facility].   Assessment/plan status:  Psychosocial Support/Ongoing Assessment of Needs Other assessment/ plan:   Information/referral to community resources:   SNF.    PATIENT'S/FAMILY'S RESPONSE TO PLAN OF CARE: Pt and pt's daughter receptive to CSW visit.      Maryclare Labrador, MSW, [facility] Clinical Social Worker [phone]

## 2012-12-06 NOTE — Progress Notes (Signed)
Dr. Cyril Mourning notified of pt CBG 508, new orders received. Will continue to monitor.

## 2012-12-06 NOTE — Progress Notes (Signed)
NUTRITION FOLLOW UP  Intervention:    Ensure Pudding daily (170 kcals, 4 gm protein per 4 oz cup) RD to follow for nutrition care plan  Nutrition Dx:   Inadequate oral intake, resolved  Goal:   Pt to meet >/= 90% of their estimated nutrition needs, met  Monitor:   PO & supplemental intake, weight, labs, I/O's  Assessment:   Patient with PMH of chronic diastolic CHF, DM, poorly controlled HTN; granddaughter reported she heard a loud sound and found her fallen down; subsequently admitted with hypertensive emergency causing pulmonary edema and acute respiratory failure.  Patient s/p bedside swallow evaluation 9/30; presented with a probable transient acute dysphagia due to intubation & deconditioning.  Spoke with patient's daughter who reported patient ate very well this AM.  RD ordered Ensure Pudding supplement per daughter's request.  Height: Ht Readings from Last 1 Encounters:  11/30/12 5\' 3"  (1.6 m)    Weight Status:   Wt Readings from Last 1 Encounters:  12/06/12 112 lb 7 oz (51 kg)    Re-estimated needs:  Kcal: 1250-1450 Protein: 70-80 gm Fluid: per MD  Skin: skin abrasion  Diet Order: Dysphagia 1, nectar thick liquids   Intake/Output Summary (Last 24 hours) at 12/06/12 1009 Last data filed at 12/06/12 0800  Gross per 24 hour  Intake 1089.83 ml  Output    900 ml  Net 189.83 ml    Labs:   Recent Labs Lab 12/03/12 0422 12/04/12 0439 12/05/12 0400 12/06/12 0420  NA 150* 154* 156* 156*  K 4.5 5.1 3.8 3.9  CL 116* 116* 120* 121*  CO2 27 23 30 27   BUN 38* 41* 42* 44*  CREATININE 1.37* 1.36* 1.26* 1.27*  CALCIUM 8.6 8.8 8.9 8.8  MG 2.2 2.2 2.1  --   PHOS 3.1 4.6 3.0  --   GLUCOSE 188* 193* 148* 192*    CBG (last 3)   Recent Labs  12/05/12 2347 12/06/12 0403 12/06/12 0712  GLUCAP 319* 174* 151*    Scheduled Meds: . albuterol  2.5 mg Nebulization QID  . aspirin EC  81 mg Oral Daily  . cloNIDine  0.2 mg Oral BID  . diltiazem  30 mg Oral Q6H   . enoxaparin (LOVENOX) injection  30 mg Subcutaneous Q24H  . furosemide  40 mg Oral Daily  . insulin aspart  0-9 Units Subcutaneous Q4H  . losartan  100 mg Oral Daily  . metoprolol  5 mg Intravenous Q6H  . nitroGLYCERIN  1 inch Topical TID  . pantoprazole (PROTONIX) IV  40 mg Intravenous Daily  . sodium chloride  3 mL Intravenous Q12H    Continuous Infusions: . sodium chloride 20 mL/hr (12/05/12 0300)  . sodium chloride Stopped (12/03/12 2000)  . dextrose    . diltiazem (CARDIZEM) infusion Stopped (12/05/12 1330)  . fentaNYL infusion INTRAVENOUS 25 mcg/hr (12/02/12 1900)    Maureen Chatters, RD, LDN Pager #: [phone] After-Hours Pager #: [phone]

## 2012-12-06 NOTE — Progress Notes (Signed)
PULMONARY  / CRITICAL CARE MEDICINE  Name: Carmen [name] MRN: [id number] DOB: 1933-06-16    ADMISSION DATE:  11/30/2012 CONSULTATION DATE:  11/30/2012  REFERRING MD :  Joya Martyr PRIMARY SERVICE: PCCM  CHIEF COMPLAINT:  Hypertensive   BRIEF PATIENT DESCRIPTION: 70 y/ female with hypertension and DM2 was admitted on 9/25 with hypertensive emergency causing pulmonary edema and acute respiratory failure.  SIGNIFICANT EVENTS / STUDIES:  9/25 admission  LINES / TUBES: 9/25 ETT >> 9/28 9/25 R IJ CVL >>  CULTURES: Blood 9/26 >>  Urine 9/27 >> negative  ANTIBIOTICS: None  SUBJECTIVE:  Remains weak dilt gtt weaned to off  VITAL SIGNS: Temp:  [97.3 F (36.3 C)-97.6 F (36.4 C)] 97.3 F (36.3 C) (10/01 0712) Pulse Rate:  [58-62] 60 (10/01 0800) Resp:  [16-32] 20 (10/01 0800) BP: (118-194)/(34-114) 185/58 mmHg (10/01 0800) SpO2:  [91 %-100 %] 99 % (10/01 0900) Weight:  [51 kg (112 lb 7 oz)] 51 kg (112 lb 7 oz) (10/01 0500) HEMODYNAMICS:   VENTILATOR SETTINGS:   INTAKE / OUTPUT: Intake/Output     09/30 0701 - 10/01 0700 10/01 0701 - 10/02 0700   P.O. 525 420   I.V. (mL/kg) 279.8 (5.5)    Total Intake(mL/kg) 804.8 (15.8) 420 (8.2)   Urine (mL/kg/hr) 1045 (0.9)    Total Output 1045     Net -240.2 +420        Urine Occurrence 1 x     PHYSICAL EXAMINATION:  Gen: alert and interactive this AM.  HEENT: NCAT, PERRL, soft voice PULM: crackles bilaterally CV: RRR, no mgr, no JVD AB: BS+, soft, nontender, no hsm Ext: warm, trace edema, no clubbing, no cyanosis Derm: no rash or skin breakdown Neuro: globally weak, soft voice, moves all ext  LABS:  CBC Recent Labs     12/04/12  0439  12/05/12  0400  12/06/12  0420  WBC  7.9  6.1  4.4  HGB  14.0  13.0  13.0  HCT  41.4  38.4  39.6  PLT  154  150  158   Coag's No results found for this basename: APTT, INR,  in the last 72 hours BMET Recent Labs     12/04/12  0439  12/05/12  0400  12/06/12  0420  NA  154*   156*  156*  K  5.1  3.8  3.9  CL  116*  120*  121*  CO2  23  30  27   BUN  41*  42*  44*  CREATININE  1.36*  1.26*  1.27*  GLUCOSE  193*  148*  192*   Electrolytes Recent Labs     12/04/12  0439  12/05/12  0400  12/06/12  0420  CALCIUM  8.8  8.9  8.8  MG  2.2  2.1   --   PHOS  4.6  3.0   --    Sepsis Markers No results found for this basename: LACTICACIDVEN, PROCALCITON, O2SATVEN,  in the last 72 hours ABG No results found for this basename: PHART, PCO2ART, PO2ART,  in the last 72 hours Liver Enzymes Recent Labs     12/06/12  0420  AST  32  ALT  33  ALKPHOS  93  BILITOT  0.3  ALBUMIN  2.3*   Cardiac Enzymes No results found for this basename: TROPONINI, PROBNP,  in the last 72 hours Glucose Recent Labs     12/05/12  0411  12/05/12  1111  12/05/12  1941  12/05/12  2347  12/06/12  0403  12/06/12  0712  GLUCAP  143*  198*  382*  319*  174*  151*    Imaging Dg Chest Port 1 View  12/05/2012   CLINICAL DATA:  Cough, shortness of Breath.  EXAM: PORTABLE CHEST - 1 VIEW  COMPARISON:  12/03/2012  FINDINGS: Support devices are stable. Cardiomegaly. Bibasilar atelectasis or infiltrates have increased since prior study. Vascular congestion. Question small effusions.  IMPRESSION: Increasing bibasilar atelectasis or infiltrates. Suspect small effusions.   Electronically Signed   By: Charlett Nose M.D.   On: 12/05/2012 05:48   CXR: 9/30  bilateral pulmonary edema, increased effusions  ASSESSMENT / PLAN:  PULMONARY A: Acute hypercarbic and hypoxemic respiratory failure, recurrent pm 9/28. No BiPAP required o/n 9/29 P:    - BiPAP prn, has not required for 48h - Lasix > changed to PO 10/1 - PRN albuterol. - See cards.  CARDIOVASCULAR A: Hypertensive emergency Severe HTN with poor med compliance P:  - BP control ordered by Dr Jacinto Halim, dilt gtt weaned to off - Diuresis as ordered.  RENAL A:  S/p renal artery stenting in past Hypokalemia,  hypernatremia P:   - Monitor  UOP. - Lasix daily changed to PO  - Replace electrolytes as needed. - add D5w on 10/1, follow Na  GASTROINTESTINAL A:  No acute issues P:  - Pantoprazole for stress ulcer prophylaxis. - PO diet > Ok for Dysphagia 1 + nectar thick liquids  HEMATOLOGIC A:  No acute issues P:  - Monitor for bleeding.  INFECTIOUS A:  No acute issues P:   - Monitor for fever.  ENDOCRINE A: DM2 P:   - SSI > change to qac + hs - convert back to home regimen when better PO intake  NEUROLOGIC A:  Hypersomnolence > improved P:    TODAY'S SUMMARY: Diuresis, BP control, to SDU on 10/1, to [facility] as of 10/2  I have personally obtained a history, examined the patient, evaluated laboratory and imaging results, formulated the assessment and plan and placed orders.  Levy Pupa, MD, PhD 12/06/2012, 9:31 AM [facility] [phone] or if no answer [phone]

## 2012-12-07 DIAGNOSIS — E41 Nutritional marasmus: Secondary | ICD-10-CM

## 2012-12-07 LAB — BASIC METABOLIC PANEL
BUN: 31 mg/dL — ABNORMAL HIGH (ref 6–23)
CO2: 27 mEq/L (ref 19–32)
Calcium: 8.5 mg/dL (ref 8.4–10.5)
Glucose, Bld: 83 mg/dL (ref 70–99)
Potassium: 3.2 mEq/L — ABNORMAL LOW (ref 3.5–5.1)
Sodium: 154 mEq/L — ABNORMAL HIGH (ref 135–145)

## 2012-12-07 LAB — PHOSPHORUS: Phosphorus: 2.7 mg/dL (ref 2.3–4.6)

## 2012-12-07 LAB — GLUCOSE, CAPILLARY
Glucose-Capillary: 176 mg/dL — ABNORMAL HIGH (ref 70–99)
Glucose-Capillary: 305 mg/dL — ABNORMAL HIGH (ref 70–99)
Glucose-Capillary: 354 mg/dL — ABNORMAL HIGH (ref 70–99)
Glucose-Capillary: 119 mg/dL — ABNORMAL HIGH (ref 70–99)
Glucose-Capillary: 46 mg/dL — ABNORMAL LOW (ref 70–99)
Glucose-Capillary: 256 mg/dL — ABNORMAL HIGH (ref 70–99)
Glucose-Capillary: 153 mg/dL — ABNORMAL HIGH (ref 70–99)
Glucose-Capillary: 165 mg/dL — ABNORMAL HIGH (ref 70–99)

## 2012-12-07 LAB — MAGNESIUM: Magnesium: 1.9 mg/dL (ref 1.5–2.5)

## 2012-12-07 MED ORDER — SPIRONOLACTONE 25 MG PO TABS
25.0000 mg | ORAL_TABLET | Freq: Every day | ORAL | Status: DC
  Administered 2012-12-07 – 2012-12-12 (×6): 25 mg via ORAL
  Filled 2012-12-07 (×7): qty 1

## 2012-12-07 MED ORDER — SODIUM CHLORIDE 0.45 % IV SOLN
INTRAVENOUS | Status: DC
  Administered 2012-12-07: 01:00:00 via INTRAVENOUS

## 2012-12-07 MED ORDER — INSULIN ASPART 100 UNIT/ML ~~LOC~~ SOLN: 2.0000 [IU] | SUBCUTANEOUS | Status: DC

## 2012-12-07 MED ORDER — DEXTROSE 50 % IV SOLN
25.0000 mL | INTRAVENOUS | Status: DC | PRN
  Administered 2012-12-07: 25 mL via INTRAVENOUS
  Filled 2012-12-07: qty 50

## 2012-12-07 MED ORDER — INSULIN ASPART 100 UNIT/ML ~~LOC~~ SOLN
0.0000 [IU] | Freq: Three times a day (TID) | SUBCUTANEOUS | Status: DC
  Administered 2012-12-07: 8 [IU] via SUBCUTANEOUS

## 2012-12-07 MED ORDER — INSULIN ASPART PROT & ASPART (70-30 MIX) 100 UNIT/ML ~~LOC~~ SUSP: 10.0000 [IU] | Freq: Two times a day (BID) | SUBCUTANEOUS | Status: DC

## 2012-12-07 MED ORDER — POTASSIUM CHLORIDE CRYS ER 20 MEQ PO TBCR
40.0000 meq | EXTENDED_RELEASE_TABLET | Freq: Once | ORAL | Status: AC
  Administered 2012-12-07: 40 meq via ORAL
  Filled 2012-12-07: qty 2

## 2012-12-07 MED ORDER — INSULIN ASPART PROT & ASPART (70-30 MIX) 100 UNIT/ML ~~LOC~~ SUSP
10.0000 [IU] | Freq: Two times a day (BID) | SUBCUTANEOUS | Status: DC
  Administered 2012-12-07 – 2012-12-10 (×4): 10 [IU] via SUBCUTANEOUS
  Filled 2012-12-07: qty 10

## 2012-12-07 MED ORDER — INSULIN REGULAR BOLUS VIA INFUSION
0.0000 [IU] | Freq: Three times a day (TID) | INTRAVENOUS | Status: DC
  Filled 2012-12-07: qty 10

## 2012-12-07 MED ORDER — INSULIN ASPART 100 UNIT/ML ~~LOC~~ SOLN: 0.0000 [IU] | Freq: Every day | SUBCUTANEOUS | Status: DC

## 2012-12-07 MED ORDER — INSULIN REGULAR HUMAN 100 UNIT/ML IJ SOLN
INTRAMUSCULAR | Status: DC
  Administered 2012-12-07: 2.9 [IU]/h via INTRAVENOUS
  Filled 2012-12-07: qty 1

## 2012-12-07 MED ORDER — SODIUM CHLORIDE 0.45 % IV SOLN
INTRAVENOUS | Status: AC
  Administered 2012-12-07: 10:00:00 via INTRAVENOUS

## 2012-12-07 MED ORDER — ISOSORBIDE MONONITRATE ER 60 MG PO TB24
120.0000 mg | ORAL_TABLET | Freq: Every day | ORAL | Status: DC
  Administered 2012-12-07 – 2012-12-12 (×6): 120 mg via ORAL
  Filled 2012-12-07 (×7): qty 2

## 2012-12-07 MED ORDER — DEXTROSE-NACL 5-0.45 % IV SOLN
INTRAVENOUS | Status: DC
  Administered 2012-12-07: 04:00:00 via INTRAVENOUS

## 2012-12-07 MED ORDER — POTASSIUM CHLORIDE 20 MEQ/15ML (10%) PO LIQD
40.0000 meq | Freq: Once | ORAL | Status: DC
  Filled 2012-12-07: qty 30

## 2012-12-07 MED ORDER — DILTIAZEM HCL ER COATED BEADS 300 MG PO CP24
300.0000 mg | ORAL_CAPSULE | Freq: Every day | ORAL | Status: DC
  Administered 2012-12-07 – 2012-12-12 (×6): 300 mg via ORAL
  Filled 2012-12-07 (×7): qty 1

## 2012-12-07 NOTE — Progress Notes (Signed)
CRITICAL VALUE ALERT  Critical value received:  Glucose 594  Date of notification:  12/06/2012  Time of notification:  2340  Critical value read back:yes  Nurse who received alert:  Bernie Covey, RN  MD notified (1st page):  Dr. Delford Field  Time of first page:  2359  MD notified (2nd page):  Time of second page:  Responding MD:  Dr. Delford Field  Time MD responded:  [phone]

## 2012-12-07 NOTE — Progress Notes (Signed)
Hypoglycemic Event  CBG: 46  Treatment: D50 IV 25 mL  Symptoms: None  Follow-up CBG: Time:0505 CBG Result:89  Possible Reasons for Event: Medication regimen: IV insulin  Comments/MD notified: Dr. Angus Palms, Simmie Davies  Remember to initiate Hypoglycemia Order Set & complete

## 2012-12-07 NOTE — Progress Notes (Signed)
Inpatient Diabetes Program Recommendations  AACE/ADA: New Consensus Statement on Inpatient Glycemic Control (2013)  Target Ranges:  Prepandial:   less than 140 mg/dL      Peak postprandial:   less than 180 mg/dL (1-2 hours)      Critically ill patients:  140 - 180 mg/dL   Reason for Visit: Hyperglycemia along with Hypoglycemia Results for MYRLA, MALANOWSKI (MRN [id number]) as of 12/07/2012 12:18  Ref. Range 12/07/2012 00:51 12/07/2012 01:51 12/07/2012 02:46 12/07/2012 03:48 12/07/2012 04:45 12/07/2012 04:47 12/07/2012 05:05 12/07/2012 08:01 12/07/2012 11:38  Glucose-Capillary Latest Range: 70-99 mg/dL 409 (H) 811 (H) 914 (H) 119 (H) 46 (L) 46 (L) 89 73 256 (H)   Hypoglycemia most likely d/t large amt Novolog prior to starting [employer]. Needs basal insulin. On 70/30 15 units bid at home.  Inpatient Diabetes Program Recommendations Insulin - Basal: Start Lantus 15 units QHS HgbA1C: .  Note: Will continue to follow. Thank you. Ailene Ards, RD, LDN, CDE Inpatient Diabetes Coordinator [phone]

## 2012-12-07 NOTE — Progress Notes (Signed)
Physical Therapy Treatment Patient Details Name: Carmen [name] MRN: [id number] DOB: 1934-02-20 Today's Date: 12/07/2012 Time: 1005-1029 PT Time Calculation (min): 24 min  PT Assessment / Plan / Recommendation  History of Present Illness 79 y.o. female with PMH of Chronic diastolic CHF, DM, Poorly controlled HTN is brought to the ER by her grand daughter today who she has been staying with since Thursday..   PT Comments   Pt able to ambulate this session and improve overall mobility however continue to recommend further therapy prior to d/c home.    Follow Up Recommendations  SNF;Supervision/Assistance - 24 hour     Equipment Recommendations  None recommended by PT    Frequency Min 3X/week   Progress towards PT Goals Progress towards PT goals: Progressing toward goals  Plan Current plan remains appropriate    Precautions / Restrictions Precautions Precautions: Fall Precaution Comments: on 2 LO2 via [place] Restrictions Weight Bearing Restrictions: No   Pertinent Vitals/Pain No c/o pain; continues to have high BP however MD and RN aware 194/76     Mobility  Transfers Transfers: Sit to Stand;Stand to Sit Sit to Stand: 4: Min assist;From chair/3-in-1 Stand to Sit: 3: Mod assist;To chair/3-in-1 Details for Transfer Assistance: (A) to initiate transfer with cues for hand placement. Increase (A) to slowly descend to recliner.  Ambulation/Gait Ambulation/Gait Assistance: 1: +2 Total assist Ambulation/Gait: Patient Percentage: 70% Ambulation Distance (Feet): 80 Feet Assistive device: Rolling walker Ambulation/Gait Assistance Details: (A) to maintain balance with max cues for upright posture, RW placement, increase step length and gait speed and occasional (A) to reposition right UE on RW. Gait Pattern: Step-through pattern;Decreased stride length;Shuffle;Trunk flexed Gait velocity: decreased Stairs: No    Exercises     PT Diagnosis:    PT Problem List:   PT Treatment  Interventions:     PT Goals (current goals can now be found in the care plan section) Acute Rehab PT Goals Patient Stated Goal: home but agreeable to rehab PT Goal Formulation: With patient Time For Goal Achievement: 12/12/12 Potential to Achieve Goals: Good  Visit Information  Last PT Received On: 12/07/12 Assistance Needed: +1 (+2 helpful with lines) History of Present Illness: 79 y.o. female with PMH of Chronic diastolic CHF, DM, Poorly controlled HTN is brought to the ER by her grand daughter today who she has been staying with since Thursday..    Subjective Data  Subjective: "I'm ready to get OOB.  Patient Stated Goal: home but agreeable to rehab   Cognition  Cognition Arousal/Alertness: Awake/alert Behavior During Therapy: WFL for tasks assessed/performed Overall Cognitive Status: Within Functional Limits for tasks assessed    Balance     End of Session PT - End of Session Equipment Utilized During Treatment: Gait belt;Oxygen Activity Tolerance: Patient tolerated treatment well Patient left: in chair;with call bell/phone within reach Nurse Communication: Mobility status   GP     Tyshia Fenter 12/07/2012, 11:57 AM  Jake Shark, PT DPT [phone]

## 2012-12-07 NOTE — Progress Notes (Addendum)
Subjective:  Patient more alert and responds appropriately. No chest pain and states her breathing is much better. Good appetite, finished her breakfast tray. Generally feels tired but overall states that she's feeling much better. Breathing is also much better. Objective:  Vital Signs in the last 24 hours: Temp:  [97.4 F (36.3 C)-97.9 F (36.6 C)] 97.7 F (36.5 C) (10/02 0759) Pulse Rate:  [59-67] 60 (10/01 2330) Resp:  [12-25] 19 (10/02 0340) BP: (152-224)/(48-80) 152/61 mmHg (10/02 0340) SpO2:  [92 %-100 %] 100 % (10/02 0340) Weight:  [50.4 kg (111 lb 1.8 oz)] 50.4 kg (111 lb 1.8 oz) (10/02 0500)  Intake/Output from previous day: 10/01 0701 - 10/02 0700 In: 1926.1 [P.O.:1020; I.V.:906.1] Out: 1300 [Urine:1300]  Physical Exam:   General appearance: still looks fatigued but much improved since yesterday, no distress, slowed mentation,  Lungs: Clear Heart: regular rate and rhythm, S1, S2 normal, no murmur, click, rub or gallop distant heart sounds. S4 gallop present  Abdomen: soft, non-tender; bowel sounds normal; no masses, no organomegaly  Extremities: extremities normal, atraumatic, no cyanosis or edema, skin is dry and wrinkled. Neurologic: alert and ox3 no focal neuro deficits   Lab Results:  Recent Labs  12/05/12 0400 12/06/12 0420  WBC 6.1 4.4  HGB 13.0 13.0  PLT 150 158    Recent Labs  12/06/12 2300 12/07/12 0500  NA 146* 154*  K 3.9 3.2*  CL 112 118*  CO2 26 27  GLUCOSE 594* 83  BUN 34* 31*  CREATININE 1.18* 1.10  Lipid Panel     Component Value Date/Time   CHOL 149 05/29/2012 0657   TRIG 59 05/29/2012 0657   HDL 87 05/29/2012 0657   CHOLHDL 1.7 05/29/2012 0657   VLDL 12 05/29/2012 0657   LDLCALC 50 05/29/2012 0657   Tele: NSR  Assessment/Plan:  1. Hypertensive urgency with pulmonary edema, resolved 2. Hypernatremia due to dehydration from excessive diuresis, worsening hypernatremia. 3. Respiratory failure 4. PAD 5. Uncontrolled DM 6. Acute on  chronic renal insufficiecy. Hyperkalemia due to hemolysis. No evidence of renal artery stenosis by abdominal angiogram performed about 3-4 months ago.  Recommendation: I have changed her Cardizem to Cardizem long acting, I will hold off on by mouth Lasix and give gentle hydration at 50 cc per hour for 10 hours. Changed D5.45 to 1/2 NS. If she indeed develops any respiratory distress or worsening dyspnea, obviously she may be developing pulmonary edema, hence may need to be watched. I will obtain BMP and magnesium level in the morning. I will also start the patient on Aldactone 25 mg by mouth daily for better blood pressure control. Overall with a systolic blood pressure between 150-160 mmHg, I would be happy with the control. Lasix has been already cotrolled.  Needs to restart sliding scale insulin. Was on insulin drip overnight. Also developed hypoglycemia.  Pamella Pert, M.D. 12/07/2012, 8:46 AM [facility] Pager: [phone] Office: [phone] If no answer: [phone]

## 2012-12-07 NOTE — Progress Notes (Signed)
eLink Physician-Brief Progress Note Patient Name: Carmen [name] DOB: Nov 13, 1933 MRN: [id number]  Date of Service  12/07/2012   HPI/Events of Note  CBGs now low   eICU Interventions  Transition off stabilizer insulin drip   Intervention Category Major Interventions: Hyperglycemia - active titration of insulin therapy  Shan Levans 12/07/2012, 5:19 AM

## 2012-12-07 NOTE — Clinical Social Work Note (Addendum)
12:17pm, 12/07/12: CSW left pt's daughter, Bernadene Bell, a voicemail letting her know that CSW left [facility] a voicemail confirming that this facility is the pt's first choice. CSW offered to provide Debora with the names of the other 7 facilities that pt received bed offers from, to have 2nd and 3rd choices ready for back-up if pt changes her mind or if a bed is not available at [facility] when pt is ready for discharge.   CSW presented bed offers to patient. Patient requested CSW speak with her daughter, and let her daughter choose a facility. CSW called phone number in the chart for daughter, and received a message that this number is not working. CSW called pt's spouse and left him a message requesting that he give CSW a call back to provide an accurate number for pt's daughter, or call CSW back so he can select a SNF.    Carmen [name], MSW, [facility] Clinical Social Worker [phone]

## 2012-12-07 NOTE — Progress Notes (Signed)
eLink Physician-Brief Progress Note Patient Name: Carmen [name] DOB: 08/13/1933 MRN: [id number]  Date of Service  12/07/2012   HPI/Events of Note   K low   eICU Interventions  K supp   Intervention Category Major Interventions: Electrolyte abnormality - evaluation and management  Shan Levans 12/07/2012, 6:44 AM

## 2012-12-07 NOTE — Progress Notes (Signed)
eLink Physician-Brief Progress Note Patient Name: Carmen [name] DOB: 1933-08-03 MRN: [id number]  Date of Service  12/07/2012   HPI/Events of Note   CBG > 500  eICU Interventions  Insulin drip   Intervention Category Major Interventions: Hyperglycemia - active titration of insulin therapy  Shan Levans 12/07/2012, 12:19 AM

## 2012-12-07 NOTE — Progress Notes (Signed)
Pt being transferred to 4e24 in stable condition. Report given to receiving rn and family informed of new room.

## 2012-12-07 NOTE — Progress Notes (Signed)
[facility] Progress Note [place] TEAM 1 - Stepdown/ICU TEAM   Carmen [name] WUJ:[id number] DOB: [birth year] DOA: 11/30/2012 PCP: Gwynneth Aliment, MD  Brief narrative:  Carmen [name] is a 79 y.o. female with PMH of Chronic diastolic CHF, DM, Poorly controlled HTN is brought to the ER by her grand daughter. She initially was found having fallen on the floor. As the day went on she became short of breath. EMS was called and she was noted to be hypoglycemic with CBG of 44- given D50 and taken to ER. In ER was noted to have a CHF exacerbation. Per granddaughter, pt was compliant with meds.   Assessment/Plan: Principal Problem:   Acute respiratory failure requiring intubation   Acute on chronic diastolic CHF (congestive heart failure)- now resolved with diuresis- Dr Jacinto Halim following.   Active Problems:   HTN (hypertension), malignant - resolved  Dehydration - due to overdiuresis - follow on IVF- being managed by Dr Jacinto Halim    DM (diabetes mellitus), type 2, uncontrolled, periph vascular complic - currently controlled - Pt takes NPH at home- will resume it at a lower dose (10 U BID) for now    History of CVA (cerebrovascular accident) with associated mild right upper extremity hemiplegia    Cardiac pacemaker for history of intermittent heart block    Protein-calorie malnutrition, severe     Code Status: full code Family Communication: none Disposition Plan: transfer to tele  Consultants: cardiology  Procedures: 9/25 ETT >> 9/28  9/25 R IJ CVL >>   Antibiotics: none  DVT prophylaxis: Lovenox  HPI/Subjective: Pt breathing much better. Feels weak. Back hurts.    Objective: Blood pressure 123/55, pulse 61, temperature 97.5 F (36.4 C), temperature source Oral, resp. rate 20, height 5\' 3"  (1.6 m), weight 50.4 kg (111 lb 1.8 oz), SpO2 96.00%.  Intake/Output Summary (Last 24 hours) at 12/07/12 1819 Last data filed at 12/07/12 1600  Gross per 24 hour  Intake  2403.64 ml  Output   1550 ml  Net 853.64 ml     Exam: General: No acute respiratory distress Lungs: Clear to auscultation bilaterally without wheezes or crackles Cardiovascular: Regular rate and rhythm without murmur gallop or rub normal S1 and S2 Abdomen: Nontender, nondistended, soft, bowel sounds positive, no rebound, no ascites, no appreciable mass Extremities: No significant cyanosis, clubbing, or edema bilateral lower extremities  Data Reviewed: Basic Metabolic Panel:  Recent Labs Lab 12/02/12 0500 12/03/12 0422 12/04/12 0439 12/05/12 0400 12/06/12 0420 12/06/12 2300 12/07/12 0500  NA 148* 150* 154* 156* 156* 146* 154*  K 3.2* 4.5 5.1 3.8 3.9 3.9 3.2*  CL 111 116* 116* 120* 121* 112 118*  CO2 28 27 23 30 27 26 27   GLUCOSE 164* 188* 193* 148* 192* 594* 83  BUN 33* 38* 41* 42* 44* 34* 31*  CREATININE 1.49* 1.37* 1.36* 1.26* 1.27* 1.18* 1.10  CALCIUM 8.4 8.6 8.8 8.9 8.8 8.2* 8.5  MG 2.1 2.2 2.2 2.1  --   --  1.9  PHOS 3.5 3.1 4.6 3.0  --   --  2.7   Liver Function Tests:  Recent Labs Lab 12/06/12 0420  AST 32  ALT 33  ALKPHOS 93  BILITOT 0.3  PROT 6.1  ALBUMIN 2.3*   No results found for this basename: LIPASE, AMYLASE,  in the last 168 hours No results found for this basename: AMMONIA,  in the last 168 hours CBC:  Recent Labs Lab 12/02/12 0500 12/03/12 0422 12/04/12 0439 12/05/12 0400 12/06/12  0420  WBC 7.1 7.8 7.9 6.1 4.4  HGB 12.6 12.9 14.0 13.0 13.0  HCT 36.3 37.1 41.4 38.4 39.6  MCV 96.0 97.9 100.5* 100.8* 101.5*  PLT 133* 133* 154 150 158   Cardiac Enzymes:  Recent Labs Lab 11/30/12 2229  TROPONINI <0.30   BNP (last 3 results)  Recent Labs  08/12/12 1112 11/30/12 1509  PROBNP 2540.0* 10923.0*   CBG:  Recent Labs Lab 12/07/12 0447 12/07/12 0505 12/07/12 0801 12/07/12 1138 12/07/12 1610  GLUCAP 46* 89 73 256* 153*    Recent Results (from the past 240 hour(s))  MRSA PCR SCREENING     Status: None   Collection Time     11/30/12  9:33 PM      Result Value Range Status   MRSA by PCR NEGATIVE  NEGATIVE Final   Comment:            The GeneXpert MRSA Assay (FDA     approved for NASAL specimens     only), is one component of a     comprehensive MRSA colonization     surveillance program. It is not     intended to diagnose MRSA     infection nor to guide or     monitor treatment for     MRSA infections.  URINE CULTURE     Status: None   Collection Time    12/01/12 11:08 PM      Result Value Range Status   Specimen Description URINE, CATHETERIZED   Final   Special Requests NONE   Final   Culture  Setup Time     Final   Value: 12/02/2012 00:43     Performed at [facility] Count     Final   Value: NO GROWTH     Performed at [facility]   Culture     Final   Value: NO GROWTH     Performed at [facility]   Report Status 12/03/2012 FINAL   Final  CULTURE, BLOOD (ROUTINE X 2)     Status: None   Collection Time    12/01/12 11:30 PM      Result Value Range Status   Specimen Description BLOOD RIGHT HAND   Final   Special Requests BOTTLES DRAWN AEROBIC ONLY 10CC   Final   Culture  Setup Time     Final   Value: 12/02/2012 05:56     Performed at [facility]   Culture     Final   Value:        BLOOD CULTURE RECEIVED NO GROWTH TO DATE CULTURE WILL BE HELD FOR 5 DAYS BEFORE ISSUING A FINAL NEGATIVE REPORT     Performed at [facility]   Report Status PENDING   Incomplete  CULTURE, BLOOD (ROUTINE X 2)     Status: None   Collection Time    12/01/12 11:48 PM      Result Value Range Status   Specimen Description BLOOD LEFT HAND   Final   Special Requests BOTTLES DRAWN AEROBIC ONLY 3CC   Final   Culture  Setup Time     Final   Value: 12/02/2012 05:55     Performed at [facility]   Culture     Final   Value:        BLOOD CULTURE RECEIVED NO GROWTH TO DATE CULTURE WILL BE HELD FOR 5 DAYS BEFORE ISSUING A FINAL NEGATIVE REPORT  Performed at  [facility]   Report Status PENDING   Incomplete     Studies:  Recent x-ray studies have been reviewed in detail by the Attending Physician  Scheduled Meds:  Scheduled Meds: . albuterol  2.5 mg Nebulization QID  . aspirin EC  81 mg Oral Daily  . cloNIDine  0.2 mg Oral BID  . diltiazem  300 mg Oral Daily  . enoxaparin (LOVENOX) injection  30 mg Subcutaneous Q24H  . feeding supplement  1 Container Oral QPC lunch  . insulin aspart protamine- aspart  10 Units Subcutaneous BID WC  . isosorbide mononitrate  120 mg Oral Daily  . losartan  100 mg Oral Daily  . metoprolol tartrate  100 mg Oral BID  . pantoprazole  40 mg Oral Daily  . sodium chloride  3 mL Intravenous Q12H  . spironolactone  25 mg Oral Daily   Continuous Infusions: . sodium chloride 50 mL/hr at 12/07/12 0957  . fentaNYL infusion INTRAVENOUS 25 mcg/hr (12/02/12 1900)    Time spent on care of this patient: 35 min   Eniyah Eastmond, MD  [facility] Office  [phone] Pager - Text Page per Loretha Stapler as per below:  On-Call/Text Page:      [email or web address]      password [email or web address]  If 7PM-7AM, please contact night-coverage [email or web address] Password [email or web address] 12/07/2012, 6:19 PM   LOS: 7 days

## 2012-12-08 DIAGNOSIS — E870 Hyperosmolality and hypernatremia: Secondary | ICD-10-CM

## 2012-12-08 DIAGNOSIS — G609 Hereditary and idiopathic neuropathy, unspecified: Secondary | ICD-10-CM

## 2012-12-08 DIAGNOSIS — N390 Urinary tract infection, site not specified: Secondary | ICD-10-CM

## 2012-12-08 LAB — GLUCOSE, CAPILLARY
Glucose-Capillary: 73 mg/dL (ref 70–99)
Glucose-Capillary: 172 mg/dL — ABNORMAL HIGH (ref 70–99)
Glucose-Capillary: 254 mg/dL — ABNORMAL HIGH (ref 70–99)

## 2012-12-08 LAB — BASIC METABOLIC PANEL
Calcium: 8.4 mg/dL (ref 8.4–10.5)
GFR calc non Af Amer: 46 mL/min — ABNORMAL LOW (ref >90)
Potassium: 3.7 mEq/L (ref 3.5–5.1)
Sodium: 148 mEq/L — ABNORMAL HIGH (ref 135–145)

## 2012-12-08 LAB — CULTURE, BLOOD (ROUTINE X 2): Culture: NO GROWTH

## 2012-12-08 MED ORDER — INSULIN ASPART 100 UNIT/ML ~~LOC~~ SOLN
0.0000 [IU] | Freq: Every day | SUBCUTANEOUS | Status: DC
  Administered 2012-12-08: 4 [IU] via SUBCUTANEOUS

## 2012-12-08 MED ORDER — INSULIN ASPART 100 UNIT/ML ~~LOC~~ SOLN
0.0000 [IU] | Freq: Three times a day (TID) | SUBCUTANEOUS | Status: DC
  Administered 2012-12-09: 2 [IU] via SUBCUTANEOUS
  Administered 2012-12-09: 17:00:00 1 [IU] via SUBCUTANEOUS
  Administered 2012-12-10 (×2): 2 [IU] via SUBCUTANEOUS
  Administered 2012-12-11: 17:00:00 3 [IU] via SUBCUTANEOUS
  Administered 2012-12-11: 1 [IU] via SUBCUTANEOUS
  Administered 2012-12-12: 2 [IU] via SUBCUTANEOUS
  Administered 2012-12-12: 07:00:00 1 [IU] via SUBCUTANEOUS

## 2012-12-08 NOTE — Progress Notes (Signed)
Subjective:  Patient more alert and responds appropriately. No chest pain and states her breathing is much better. Good appetite, finished her breakfast tray. Generally feels tired but overall states that she's feeling much better. Breathing is also much better. No new symptoms Objective:  Vital Signs in the last 24 hours: Temp:  [97.2 F (36.2 C)-98.6 F (37 C)] 98.1 F (36.7 C) (10/03 1419) Pulse Rate:  [59-89] 59 (10/03 1419) Resp:  [15-23] 16 (10/03 1419) BP: (151-182)/(55-80) 168/65 mmHg (10/03 1419) SpO2:  [100 %] 100 % (10/03 1419) Weight:  [53.479 kg (117 lb 14.4 oz)-53.57 kg (118 lb 1.6 oz)] 53.524 kg (118 lb) (10/03 0559)  Intake/Output from previous day: 10/02 0701 - 10/03 0700 In: 1281.3 [P.O.:640; I.V.:641.3] Out: 1100 [Urine:1100]  Physical Exam:   General appearance: still looks fatigued but much improved since yesterday, no distress, slowed mentation,  Lungs: Clear Heart: regular rate and rhythm, S1, S2 normal, no murmur, click, rub or gallop distant heart sounds. S4 gallop present  Abdomen: soft, non-tender; bowel sounds normal; no masses, no organomegaly  Extremities: extremities normal, atraumatic, no cyanosis or edema, skin is dry and wrinkled. Neurologic: alert and ox3 no focal neuro deficits   Lab Results:  Recent Labs  12/06/12 0420  WBC 4.4  HGB 13.0  PLT 158    Recent Labs  12/07/12 0500 12/08/12 0648  NA 154* 148*  K 3.2* 3.7  CL 118* 114*  CO2 27 26  GLUCOSE 83 44*  BUN 31* 27*  CREATININE 1.10 1.12*  Lipid Panel     Component Value Date/Time   CHOL 149 05/29/2012 0657   TRIG 59 05/29/2012 0657   HDL 87 05/29/2012 0657   CHOLHDL 1.7 05/29/2012 0657   VLDL 12 05/29/2012 0657   LDLCALC 50 05/29/2012 0657   Tele: NSR  Assessment/Plan:  1. Hypertensive urgency with pulmonary edema, resolved 2. Hypernatremia due to dehydration from excessive diuresis, improving. 3. Respiratory failure 4. PAD 5. Uncontrolled DM 6. Acute on chronic  renal insufficiecy. Hyperkalemia due to hemolysis. No evidence of renal artery stenosis by abdominal angiogram performed about 3-4 months ago.  Recommendation: Patient has improved overall, her pressure is fairly stable for her and ordered several blood pressure of 150-160 mm mercury. Patient has had episodes of hypoglycemia again early this morning.  At this point she is eating well and we will check her BMP in the morning, and give her more fluids only if the serum sodium continues to increase. But I suspect with increasing oral intake this should correct. I discussed with the patient the it may not be safe for her to return home and consider assisted living or nursing home placement. Patient appears to be agreeable to this. Please call me if you need any further assistance in managing the patient.   Pamella Pert, M.D. 12/08/2012, 5:52 PM [facility] Pager: [phone] Office: [phone] If no answer: [phone]

## 2012-12-08 NOTE — Progress Notes (Signed)
Pt assessment completed, VSS, denies any pain or discomfort, CBG elevated at this time 373, MD notified order for night time coverage gotten. We'll continue to monitor.

## 2012-12-08 NOTE — Progress Notes (Signed)
Critical result for blood sugar was 44 in which labs were drawn early this morning before breakfast. Night shift nurse had done CBG fingerstick right after change of shift. Shortly thereafter patient had begun to eat breakfast. After getting the critical result from lab, rechecked CBG and the result was 86. Notified Dr. Sharl Ma. No new orders given.  Will continue to monitor patient to end of shift.

## 2012-12-08 NOTE — Progress Notes (Signed)
[facility] Progress Note [place] TEAM 1 - Stepdown/ICU TEAM   YURIDIANA FORMANEK ZOX:[id number] DOB: 18-Feb-1934 DOA: 11/30/2012 PCP: Gwynneth Aliment, MD  Brief narrative:  Carmen [name] is a 79 y.o. female with PMH of Chronic diastolic CHF, DM, Poorly controlled HTN is brought to the ER by her grand daughter. She initially was found having fallen on the floor. As the day went on she became short of breath. EMS was called and she was noted to be hypoglycemic with CBG of 44- given D50 and taken to ER. In ER was noted to have a CHF exacerbation. Per granddaughter, pt was compliant with meds.   Assessment/Plan:    Acute respiratory failure requiring intubation   Acute on chronic diastolic CHF (congestive heart failure)- now resolved with diuresis- Dr Jacinto Halim following.      HTN (hypertension), malignant - resolved - Continue with cardizem, Clonidine 0.2 mg po BID, Metoprolol 100 mg po bid  Dehydration - due to overdiuresis - follow on IVF- being managed by Dr Jacinto Halim    DM (diabetes mellitus), type 2, uncontrolled, periph vascular complic - currently controlled - Patient is on Novolog 70/30 10 units BID    History of CVA (cerebrovascular accident) with associated mild right upper extremity hemiplegia    Cardiac pacemaker for history of intermittent heart block  Hyponatremia Sodium is down to 148, patient was on 1/2 NS , cardiology Dr Jacinto Halim managing the fluids. Will check BMP in am.        Code Status: full code Family Communication: none Disposition Plan: transfer to tele  Consultants: cardiology  Procedures: 9/25 ETT >> 9/28  9/25 R IJ CVL >>   Antibiotics: none  DVT prophylaxis: Lovenox  HPI/Subjective: Patient seen and examined, denies chest pain or shortness of breath.   Objective: Blood pressure 168/65, pulse 59, temperature 98.1 F (36.7 C), temperature source Oral, resp. rate 16, height 5\' 3"  (1.6 m), weight 53.524 kg (118 lb), SpO2  100.00%.  Intake/Output Summary (Last 24 hours) at 12/08/12 1551 Last data filed at 12/08/12 1330  Gross per 24 hour  Intake    873 ml  Output    900 ml  Net    -27 ml     Exam: General: No acute respiratory distress Lungs: Clear to auscultation bilaterally  Cardiovascular: Regular rate and rhythm S1s2 normal Abdomen: Nontender, nondistended, soft, bowel sounds positive, no rebound, no ascites, no appreciable mass Extremities: No significant cyanosis, clubbing, or edema bilateral lower extremities  Data Reviewed: Basic Metabolic Panel:  Recent Labs Lab 12/02/12 0500 12/03/12 0422 12/04/12 0439 12/05/12 0400 12/06/12 0420 12/06/12 2300 12/07/12 0500 12/08/12 0648  NA 148* 150* 154* 156* 156* 146* 154* 148*  K 3.2* 4.5 5.1 3.8 3.9 3.9 3.2* 3.7  CL 111 116* 116* 120* 121* 112 118* 114*  CO2 28 27 23 30 27 26 27 26   GLUCOSE 164* 188* 193* 148* 192* 594* 83 44*  BUN 33* 38* 41* 42* 44* 34* 31* 27*  CREATININE 1.49* 1.37* 1.36* 1.26* 1.27* 1.18* 1.10 1.12*  CALCIUM 8.4 8.6 8.8 8.9 8.8 8.2* 8.5 8.4  MG 2.1 2.2 2.2 2.1  --   --  1.9 1.9  PHOS 3.5 3.1 4.6 3.0  --   --  2.7  --    Liver Function Tests:  Recent Labs Lab 12/06/12 0420  AST 32  ALT 33  ALKPHOS 93  BILITOT 0.3  PROT 6.1  ALBUMIN 2.3*   No results found for this basename:  LIPASE, AMYLASE,  in the last 168 hours No results found for this basename: AMMONIA,  in the last 168 hours CBC:  Recent Labs Lab 12/02/12 0500 12/03/12 0422 12/04/12 0439 12/05/12 0400 12/06/12 0420  WBC 7.1 7.8 7.9 6.1 4.4  HGB 12.6 12.9 14.0 13.0 13.0  HCT 36.3 37.1 41.4 38.4 39.6  MCV 96.0 97.9 100.5* 100.8* 101.5*  PLT 133* 133* 154 150 158   Cardiac Enzymes: No results found for this basename: CKTOTAL, CKMB, CKMBINDEX, TROPONINI,  in the last 168 hours BNP (last 3 results)  Recent Labs  08/12/12 1112 11/30/12 1509  PROBNP 2540.0* 10923.0*   CBG:  Recent Labs Lab 12/07/12 1610 12/07/12 2119 12/08/12 0721  12/08/12 0844 12/08/12 1213  GLUCAP 153* 165* 73 86 172*    Recent Results (from the past 240 hour(s))  MRSA PCR SCREENING     Status: None   Collection Time    11/30/12  9:33 PM      Result Value Range Status   MRSA by PCR NEGATIVE  NEGATIVE Final   Comment:            The GeneXpert MRSA Assay (FDA     approved for NASAL specimens     only), is one component of a     comprehensive MRSA colonization     surveillance program. It is not     intended to diagnose MRSA     infection nor to guide or     monitor treatment for     MRSA infections.  URINE CULTURE     Status: None   Collection Time    12/01/12 11:08 PM      Result Value Range Status   Specimen Description URINE, CATHETERIZED   Final   Special Requests NONE   Final   Culture  Setup Time     Final   Value: 12/02/2012 00:43     Performed at [facility] Count     Final   Value: NO GROWTH     Performed at [facility]   Culture     Final   Value: NO GROWTH     Performed at [facility]   Report Status 12/03/2012 FINAL   Final  CULTURE, BLOOD (ROUTINE X 2)     Status: None   Collection Time    12/01/12 11:30 PM      Result Value Range Status   Specimen Description BLOOD RIGHT HAND   Final   Special Requests BOTTLES DRAWN AEROBIC ONLY 10CC   Final   Culture  Setup Time     Final   Value: 12/02/2012 05:56     Performed at [facility]   Culture     Final   Value: NO GROWTH 5 DAYS     Performed at [facility]   Report Status 12/08/2012 FINAL   Final  CULTURE, BLOOD (ROUTINE X 2)     Status: None   Collection Time    12/01/12 11:48 PM      Result Value Range Status   Specimen Description BLOOD LEFT HAND   Final   Special Requests BOTTLES DRAWN AEROBIC ONLY 3CC   Final   Culture  Setup Time     Final   Value: 12/02/2012 05:55     Performed at [facility]   Culture     Final   Value: NO GROWTH 5 DAYS     Performed at [facility]   Report  Status 12/08/2012 FINAL   Final     Studies:  Recent x-ray studies have been reviewed in detail by the Attending Physician  Scheduled Meds:  Scheduled Meds: . albuterol  2.5 mg Nebulization QID  . aspirin EC  81 mg Oral Daily  . cloNIDine  0.2 mg Oral BID  . diltiazem  300 mg Oral Daily  . enoxaparin (LOVENOX) injection  30 mg Subcutaneous Q24H  . feeding supplement  1 Container Oral QPC lunch  . insulin aspart protamine- aspart  10 Units Subcutaneous BID WC  . isosorbide mononitrate  120 mg Oral Daily  . losartan  100 mg Oral Daily  . metoprolol tartrate  100 mg Oral BID  . pantoprazole  40 mg Oral Daily  . sodium chloride  3 mL Intravenous Q12H  . spironolactone  25 mg Oral Daily   Continuous Infusions: . fentaNYL infusion INTRAVENOUS 25 mcg/hr (12/02/12 1900)    Time spent on care of this patient: 25 min   Meredeth Ide, MD [phone]

## 2012-12-08 NOTE — Progress Notes (Signed)
MD on call Schorr ordered to give scheduled 10AM blood pressure medicine and aldactone for high BP, 182/80. Medicines ordered from pharmacy. Baron Hamper, RN

## 2012-12-08 NOTE — Progress Notes (Signed)
CRITICAL VALUE ALERT  Critical value received:  Glucose blood sugar - 44  Date of notification:  12/08/2012  Time of notification:  0845  Critical value read back:yes  Nurse who received alert:  Milta Deiters  MD notified (1st page):  Dr. Sharl Ma  (via text page)  Time of first page:  [phone]  MD notified (2nd page):n/a   Time of second page: n/a  Responding MD:  Dr. Sharl Ma  Time MD responded:  [phone]

## 2012-12-08 NOTE — Progress Notes (Signed)
Speech Language Pathology Dysphagia Treatment Patient Details Name: Carmen [name] MRN: [id number] DOB: Feb 06, 1934 Today's Date: 12/08/2012 Time: 9562-1308 SLP Time Calculation (min): 17 min  Assessment / Plan / Recommendation Clinical Impression  Pt with much improved swallow function - masticating well despite absence of teeth; prompt swallow initiation observed; no s/s of compromised airway protection, even when taxed with large liquid boluses.  Recommend advancing diet to regular consistency, thin liquids, meds whole with liquids (give with puree if elicit cough.)  No further SLP f/u warranted - pt agrees.  Will sign off.     Diet Recommendation  Initiate / Change Diet: Regular    SLP Plan All goals met   Pertinent Vitals/Pain No pian   Swallowing Goals  SLP Swallowing Goals Patient will utilize recommended strategies during swallow to increase swallowing safety with: Modified independent assistance Swallow Study Goal #2 - Progress: Met  General Temperature Spikes Noted: No Respiratory Status: Supplemental O2 delivered via (comment) Behavior/Cognition: Alert;Cooperative;Pleasant mood Oral Cavity - Dentition: Edentulous Patient Positioning: Upright in bed  Oral Cavity - Oral Hygiene Does patient have any of the following "at risk" factors?: Oxygen therapy - cannula, mask, simple oxygen devices   Dysphagia Treatment Treatment focused on: Skilled observation of diet tolerance;Upgraded PO texture trials Treatment Methods/Modalities: Skilled observation Patient observed directly with PO's: Yes Type of PO's observed: Regular;Thin liquids Feeding: Able to feed self Liquids provided via: Cup Amount of cueing:  (no cueing)      [name] 12/08/2012, 3:31 PM

## 2012-12-08 NOTE — Progress Notes (Signed)
Pt BP 182/80 this AM, hr in 60s. Pt with no BP meds due this AM. MD on call paged. Will continue to monitor. Baron Hamper, RN

## 2012-12-08 NOTE — Progress Notes (Signed)
Notified attending MD that 10units of 70/30 Novolog was held due to her having low blood sugar. Will continue to monitor to end of shift.

## 2012-12-09 ENCOUNTER — Encounter (HOSPITAL_COMMUNITY): Payer: Self-pay | Admitting: Emergency Medicine

## 2012-12-09 DIAGNOSIS — R5381 Other malaise: Secondary | ICD-10-CM

## 2012-12-09 DIAGNOSIS — E870 Hyperosmolality and hypernatremia: Secondary | ICD-10-CM | POA: Diagnosis present

## 2012-12-09 LAB — BASIC METABOLIC PANEL
BUN: 24 mg/dL — ABNORMAL HIGH (ref 6–23)
CO2: 24 mEq/L (ref 19–32)
Calcium: 8.1 mg/dL — ABNORMAL LOW (ref 8.4–10.5)
Chloride: 104 mEq/L (ref 96–112)
Creatinine, Ser: 1.14 mg/dL — ABNORMAL HIGH (ref 0.50–1.10)
GFR calc Af Amer: 52 mL/min — ABNORMAL LOW (ref >90)
GFR calc non Af Amer: 45 mL/min — ABNORMAL LOW (ref >90)
Glucose, Bld: 197 mg/dL — ABNORMAL HIGH (ref 70–99)
Potassium: 5.2 mEq/L — ABNORMAL HIGH (ref 3.5–5.1)

## 2012-12-09 LAB — GLUCOSE, CAPILLARY
Glucose-Capillary: 52 mg/dL — ABNORMAL LOW (ref 70–99)
Glucose-Capillary: 236 mg/dL — ABNORMAL HIGH (ref 70–99)
Glucose-Capillary: 161 mg/dL — ABNORMAL HIGH (ref 70–99)
Glucose-Capillary: 129 mg/dL — ABNORMAL HIGH (ref 70–99)
Glucose-Capillary: 144 mg/dL — ABNORMAL HIGH (ref 70–99)

## 2012-12-09 MED ORDER — ACETAMINOPHEN-CODEINE #3 300-30 MG PO TABS
0.5000 | ORAL_TABLET | Freq: Three times a day (TID) | ORAL | Status: DC | PRN
  Administered 2012-12-09 – 2012-12-12 (×6): 0.5 via ORAL
  Filled 2012-12-09 (×6): qty 1

## 2012-12-09 MED ORDER — SODIUM CHLORIDE 0.9 % IJ SOLN
10.0000 mL | INTRAMUSCULAR | Status: DC | PRN
  Administered 2012-12-10: 30 mL

## 2012-12-09 NOTE — Progress Notes (Signed)
Pt CBG very low 52 at this time, hypoglycemic protocol implemented.

## 2012-12-09 NOTE — Progress Notes (Signed)
CBG recheck 113 at this time, MD to be notified.

## 2012-12-09 NOTE — Progress Notes (Signed)
Incontinent of urine.  Voiding copious amount of yellow urine that is malodorous.

## 2012-12-09 NOTE — Progress Notes (Signed)
[facility] PROGRESS NOTE  LAVORA BRISBON ZOX:[id number] DOB: 04-Jul-1933 DOA: 11/30/2012 PCP: Gwynneth Aliment, MD  Assessment/Plan: Principal Problem:   Acute respiratory failure: Secondary to CHF exacerbation. Has diuresed almost 3 L. Active Problems:   HTN (hypertension), malignant: Better controlled today. Continue Cardizem, metoprolol and clonidine meds.    DM (diabetes mellitus), type 2, uncontrolled, periph vascular complic: Blood sugar slightly lower today. Holding evening dose of 70/30 insulin. Continue to monitor.    Peripheral neuropathy: Stable.    History of CVA (cerebrovascular accident) with associated mild right upper extremity hemiplegia: Chronic. Stable.    Cardiac pacemaker for history of intermittent heart block   Protein-calorie malnutrition, severe    Acute on chronic diastolic CHF (congestive heart failure): See above.    Hypernatremia: Started 9/27, likely secondary to overdiuresis. Patient started on D5W.  Recheck labs. His sodium up today from yesterday, we'll start gentle half-normal saline.  Dysphagia: Evaluated by speech therapy. With increased alertness, no evidence of swallow difficulty  Code Status: Full  Family Communication: Left message with granddaughter Disposition Plan: Skilled nursing upon recovery   Consultants:  [facility]  Procedures:  Intubated from 9/25-9/28  Has right IJ placed 9/25  Antibiotics:  None  HPI/Subjective: Patient doing okay. Complains of fatigue as well as uncomfortable in her bed. Denies any shortness of breath.  Objective: Filed Vitals:   12/09/12 0500  BP: 170/78  Pulse: 60  Temp: 97.2 F (36.2 C)  Resp: 20    Intake/Output Summary (Last 24 hours) at 12/09/12 0816 Last data filed at 12/09/12 0809  Gross per 24 hour  Intake   1280 ml  Output   1200 ml  Net     80 ml   Filed Weights   12/07/12 2149 12/08/12 0559 12/09/12 0500  Weight: 53.57 kg (118 lb 1.6 oz) 53.524 kg (118 lb)  55.5 kg (122 lb 5.7 oz)    Exam:   General:  Alert and oriented x2, no acute distress  Cardiovascular: Regular rate and rhythm, paced, 2/6 systolic ejection murmur  Respiratory: Clear auscultation bilaterally  Abdomen: Soft, nontender, nondistended, positive bowel sounds  Musculoskeletal: No clubbing or cyanosis, trace pitting edema   Data Reviewed: Basic Metabolic Panel:  Recent Labs Lab 12/03/12 0422 12/04/12 0439 12/05/12 0400 12/06/12 0420 12/06/12 2300 12/07/12 0500 12/08/12 0648  NA 150* 154* 156* 156* 146* 154* 148*  K 4.5 5.1 3.8 3.9 3.9 3.2* 3.7  CL 116* 116* 120* 121* 112 118* 114*  CO2 27 23 30 27 26 27 26   GLUCOSE 188* 193* 148* 192* 594* 83 44*  BUN 38* 41* 42* 44* 34* 31* 27*  CREATININE 1.37* 1.36* 1.26* 1.27* 1.18* 1.10 1.12*  CALCIUM 8.6 8.8 8.9 8.8 8.2* 8.5 8.4  MG 2.2 2.2 2.1  --   --  1.9 1.9  PHOS 3.1 4.6 3.0  --   --  2.7  --    Liver Function Tests:  Recent Labs Lab 12/06/12 0420  AST 32  ALT 33  ALKPHOS 93  BILITOT 0.3  PROT 6.1  ALBUMIN 2.3*   CBC:  Recent Labs Lab 12/03/12 0422 12/04/12 0439 12/05/12 0400 12/06/12 0420  WBC 7.8 7.9 6.1 4.4  HGB 12.9 14.0 13.0 13.0  HCT 37.1 41.4 38.4 39.6  MCV 97.9 100.5* 100.8* 101.5*  PLT 133* 154 150 158   BNP (last 3 results)  Recent Labs  08/12/12 1112 11/30/12 1509  PROBNP 2540.0* 10923.0*   CBG:  Recent Labs Lab 12/08/12 1213 12/08/12  1639 12/08/12 2055 12/09/12 0546 12/09/12 0613  GLUCAP 172* 254* 373* 52* 113*    Recent Results (from the past 240 hour(s))  MRSA PCR SCREENING     Status: None   Collection Time    11/30/12  9:33 PM      Result Value Range Status   MRSA by PCR NEGATIVE  NEGATIVE Final   Comment:            The GeneXpert MRSA Assay (FDA     approved for NASAL specimens     only), is one component of a     comprehensive MRSA colonization     surveillance program. It is not     intended to diagnose MRSA     infection nor to guide or      monitor treatment for     MRSA infections.  URINE CULTURE     Status: None   Collection Time    12/01/12 11:08 PM      Result Value Range Status   Specimen Description URINE, CATHETERIZED   Final   Special Requests NONE   Final   Culture  Setup Time     Final   Value: 12/02/2012 00:43     Performed at [facility] Count     Final   Value: NO GROWTH     Performed at [facility]   Culture     Final   Value: NO GROWTH     Performed at [facility]   Report Status 12/03/2012 FINAL   Final  CULTURE, BLOOD (ROUTINE X 2)     Status: None   Collection Time    12/01/12 11:30 PM      Result Value Range Status   Specimen Description BLOOD RIGHT HAND   Final   Special Requests BOTTLES DRAWN AEROBIC ONLY 10CC   Final   Culture  Setup Time     Final   Value: 12/02/2012 05:56     Performed at [facility]   Culture     Final   Value: NO GROWTH 5 DAYS     Performed at [facility]   Report Status 12/08/2012 FINAL   Final  CULTURE, BLOOD (ROUTINE X 2)     Status: None   Collection Time    12/01/12 11:48 PM      Result Value Range Status   Specimen Description BLOOD LEFT HAND   Final   Special Requests BOTTLES DRAWN AEROBIC ONLY 3CC   Final   Culture  Setup Time     Final   Value: 12/02/2012 05:55     Performed at [facility]   Culture     Final   Value: NO GROWTH 5 DAYS     Performed at [facility]   Report Status 12/08/2012 FINAL   Final     Studies: No results found.  Scheduled Meds: . albuterol  2.5 mg Nebulization QID  . aspirin EC  81 mg Oral Daily  . cloNIDine  0.2 mg Oral BID  . diltiazem  300 mg Oral Daily  . enoxaparin (LOVENOX) injection  30 mg Subcutaneous Q24H  . feeding supplement  1 Container Oral QPC lunch  . insulin aspart  0-5 Units Subcutaneous QHS  . insulin aspart  0-9 Units Subcutaneous TID WC  . insulin aspart protamine- aspart  10 Units Subcutaneous BID WC  . isosorbide mononitrate   120 mg Oral Daily  . losartan  100 mg Oral Daily  . metoprolol tartrate  100 mg Oral BID  . pantoprazole  40 mg Oral Daily  . sodium chloride  3 mL Intravenous Q12H  . spironolactone  25 mg Oral Daily   Continuous Infusions: . fentaNYL infusion INTRAVENOUS 25 mcg/hr (12/02/12 1900)    Principal Problem:   Acute respiratory failure Active Problems:   HTN (hypertension), malignant   DM (diabetes mellitus), type 2, uncontrolled, periph vascular complic   Peripheral neuropathy   History of CVA (cerebrovascular accident) with associated mild right upper extremity hemiplegia   Cardiac pacemaker for history of intermittent heart block   Protein-calorie malnutrition, severe   Acute on chronic diastolic CHF (congestive heart failure)   Hypernatremia    Time spent: 20 minutes    Hollice Espy  [facility] Pager [phone]. If 7PM-7AM, please contact night-coverage at [email or web address], password [email or web address] 12/09/2012, 8:16 AM  LOS: 9 days

## 2012-12-10 DIAGNOSIS — E162 Hypoglycemia, unspecified: Secondary | ICD-10-CM

## 2012-12-10 DIAGNOSIS — N390 Urinary tract infection, site not specified: Secondary | ICD-10-CM | POA: Clinically undetermined

## 2012-12-10 LAB — BASIC METABOLIC PANEL
CO2: 26 mEq/L (ref 19–32)
Calcium: 7.8 mg/dL — ABNORMAL LOW (ref 8.4–10.5)
Creatinine, Ser: 1.09 mg/dL (ref 0.50–1.10)
GFR calc Af Amer: 55 mL/min — ABNORMAL LOW (ref >90)
GFR calc non Af Amer: 48 mL/min — ABNORMAL LOW (ref >90)
Glucose, Bld: 219 mg/dL — ABNORMAL HIGH (ref 70–99)
Sodium: 139 mEq/L (ref 135–145)

## 2012-12-10 LAB — CBC
HCT: 34.3 % — ABNORMAL LOW (ref 36.0–46.0)
Hemoglobin: 11.8 g/dL — ABNORMAL LOW (ref 12.0–15.0)
MCH: 33.2 pg (ref 26.0–34.0)
MCHC: 34.4 g/dL (ref 30.0–36.0)
MCV: 96.6 fL (ref 78.0–100.0)
WBC: 4.4 10*3/uL (ref 4.0–10.5)

## 2012-12-10 LAB — URINALYSIS, ROUTINE W REFLEX MICROSCOPIC
Bilirubin Urine: NEGATIVE
Glucose, UA: NEGATIVE mg/dL
Ketones, ur: NEGATIVE mg/dL
Nitrite: POSITIVE — AB
pH: 5.5 (ref 5.0–8.0)

## 2012-12-10 LAB — GLUCOSE, CAPILLARY: Glucose-Capillary: 101 mg/dL — ABNORMAL HIGH (ref 70–99)

## 2012-12-10 MED ORDER — INSULIN ASPART PROT & ASPART (70-30 MIX) 100 UNIT/ML ~~LOC~~ SUSP
6.0000 [IU] | Freq: Two times a day (BID) | SUBCUTANEOUS | Status: DC
  Administered 2012-12-10 – 2012-12-12 (×4): 6 [IU] via SUBCUTANEOUS

## 2012-12-10 MED ORDER — INSULIN ASPART PROT & ASPART (70-30 MIX) 100 UNIT/ML ~~LOC~~ SUSP: 8.0000 [IU] | Freq: Two times a day (BID) | SUBCUTANEOUS | Status: DC

## 2012-12-10 MED ORDER — DEXTROSE 5 % IV SOLN
1.0000 g | INTRAVENOUS | Status: DC
  Administered 2012-12-10 – 2012-12-11 (×2): 1 g via INTRAVENOUS
  Filled 2012-12-10 (×3): qty 10

## 2012-12-10 NOTE — Progress Notes (Signed)
[facility] PROGRESS NOTE  Carmen [name] NFA:[id number] DOB: 03-Nov-1933 DOA: 11/30/2012 PCP: Gwynneth Aliment, MD  Assessment/Plan: Principal Problem:   Acute respiratory failure: Secondary to CHF exacerbation. Has diuresed almost 3 L.  Active Problems:   HTN (hypertension), malignant: Better controlled today. Continue Cardizem, metoprolol and clonidine meds.    DM (diabetes mellitus), type 2, uncontrolled, periph vascular complic: Patient continues to have episodes of hypoglycemia. Have been further restricting her insulin 70/30. Sugars were elevated yesterday because we had to hold her evening dose of insulin for prior hypoglycemia. Nursing also reports malodorous urine, so we'll check for UTI. This may be a cause of her hypoglycemia.    Peripheral neuropathy: Stable.    History of CVA (cerebrovascular accident) with associated mild right upper extremity hemiplegia: Chronic. Stable.    Cardiac pacemaker for history of intermittent heart block   Protein-calorie malnutrition, severe    Acute on chronic diastolic CHF (congestive heart failure): See above.    Hypernatremia: Started 9/27, likely secondary to overdiuresis. Patient started on D5W.  Recheck labs. Now resolved.  Dysphagia: Evaluated by speech therapy. With increased alertness, no evidence of swallow difficulty  Code Status: Full  Family Communication: Spoke with granddaughter today Disposition Plan: Skilled nursing upon recovery   Consultants:  [facility]  Procedures:  Intubated from 9/25-9/28  Has right IJ placed 9/25  Antibiotics:  None  HPI/Subjective: Patient doing okay. Complains of fatigue as well as uncomfortable in her bed.   Objective: Filed Vitals:   12/10/12 0616  BP: 178/79  Pulse: 60  Temp: 98.2 F (36.8 C)  Resp: 16    Intake/Output Summary (Last 24 hours) at 12/10/12 0759 Last data filed at 12/09/12 2141  Gross per 24 hour  Intake    640 ml  Output    250 ml  Net     390 ml   Filed Weights   12/08/12 0559 12/09/12 0500 12/10/12 0616  Weight: 53.524 kg (118 lb) 55.5 kg (122 lb 5.7 oz) 53.978 kg (119 lb)    Exam:   General:  Alert and oriented x2, no acute distress  Cardiovascular: Regular rate and rhythm, paced, 2/6 systolic ejection murmur  Respiratory: Clear auscultation bilaterally  Abdomen: Soft, nontender, nondistended, positive bowel sounds  Musculoskeletal: No clubbing or cyanosis, trace pitting edema   Data Reviewed: Basic Metabolic Panel:  Recent Labs Lab 12/04/12 0439 12/05/12 0400  12/06/12 2300 12/07/12 0500 12/08/12 0648 12/09/12 2045 12/10/12 0500  NA 154* 156*  < > 146* 154* 148* 136 139  K 5.1 3.8  < > 3.9 3.2* 3.7 5.2* 4.6  CL 116* 120*  < > 112 118* 114* 104 106  CO2 23 30  < > 26 27 26 24 26   GLUCOSE 193* 148*  < > 594* 83 44* 197* 219*  BUN 41* 42*  < > 34* 31* 27* 24* 23  CREATININE 1.36* 1.26*  < > 1.18* 1.10 1.12* 1.14* 1.09  CALCIUM 8.8 8.9  < > 8.2* 8.5 8.4 8.1* 7.8*  MG 2.2 2.1  --   --  1.9 1.9  --   --   PHOS 4.6 3.0  --   --  2.7  --   --   --   < > = values in this interval not displayed. Liver Function Tests:  Recent Labs Lab 12/06/12 0420  AST 32  ALT 33  ALKPHOS 93  BILITOT 0.3  PROT 6.1  ALBUMIN 2.3*   CBC:  Recent Labs Lab 12/04/12  4540 12/05/12 0400 12/06/12 0420  WBC 7.9 6.1 4.4  HGB 14.0 13.0 13.0  HCT 41.4 38.4 39.6  MCV 100.5* 100.8* 101.5*  PLT 154 150 158   BNP (last 3 results)  Recent Labs  08/12/12 1112 11/30/12 1509  PROBNP 2540.0* 10923.0*   CBG:  Recent Labs Lab 12/09/12 0830 12/09/12 1053 12/09/12 1620 12/09/12 2130 12/10/12 0601  GLUCAP 236* 161* 129* 144* 199*    Recent Results (from the past 240 hour(s))  MRSA PCR SCREENING     Status: None   Collection Time    11/30/12  9:33 PM      Result Value Range Status   MRSA by PCR NEGATIVE  NEGATIVE Final   Comment:            The GeneXpert MRSA Assay (FDA     approved for NASAL specimens      only), is one component of a     comprehensive MRSA colonization     surveillance program. It is not     intended to diagnose MRSA     infection nor to guide or     monitor treatment for     MRSA infections.  URINE CULTURE     Status: None   Collection Time    12/01/12 11:08 PM      Result Value Range Status   Specimen Description URINE, CATHETERIZED   Final   Special Requests NONE   Final   Culture  Setup Time     Final   Value: 12/02/2012 00:43     Performed at [facility] Count     Final   Value: NO GROWTH     Performed at [facility]   Culture     Final   Value: NO GROWTH     Performed at [facility]   Report Status 12/03/2012 FINAL   Final  CULTURE, BLOOD (ROUTINE X 2)     Status: None   Collection Time    12/01/12 11:30 PM      Result Value Range Status   Specimen Description BLOOD RIGHT HAND   Final   Special Requests BOTTLES DRAWN AEROBIC ONLY 10CC   Final   Culture  Setup Time     Final   Value: 12/02/2012 05:56     Performed at [facility]   Culture     Final   Value: NO GROWTH 5 DAYS     Performed at [facility]   Report Status 12/08/2012 FINAL   Final  CULTURE, BLOOD (ROUTINE X 2)     Status: None   Collection Time    12/01/12 11:48 PM      Result Value Range Status   Specimen Description BLOOD LEFT HAND   Final   Special Requests BOTTLES DRAWN AEROBIC ONLY 3CC   Final   Culture  Setup Time     Final   Value: 12/02/2012 05:55     Performed at [facility]   Culture     Final   Value: NO GROWTH 5 DAYS     Performed at [facility]   Report Status 12/08/2012 FINAL   Final     Studies: No results found.  Scheduled Meds: . albuterol  2.5 mg Nebulization QID  . aspirin EC  81 mg Oral Daily  . cloNIDine  0.2 mg Oral BID  . diltiazem  300 mg Oral Daily  . enoxaparin (LOVENOX) injection  30 mg Subcutaneous Q24H  . feeding supplement  1 Container Oral QPC lunch  . insulin aspart  0-5  Units Subcutaneous QHS  . insulin aspart  0-9 Units Subcutaneous TID WC  . insulin aspart protamine- aspart  8 Units Subcutaneous BID WC  . isosorbide mononitrate  120 mg Oral Daily  . losartan  100 mg Oral Daily  . metoprolol tartrate  100 mg Oral BID  . pantoprazole  40 mg Oral Daily  . sodium chloride  3 mL Intravenous Q12H  . spironolactone  25 mg Oral Daily   Continuous Infusions:    Principal Problem:   Acute respiratory failure Active Problems:   HTN (hypertension), malignant   DM (diabetes mellitus), type 2, uncontrolled, periph vascular complic   Peripheral neuropathy   History of CVA (cerebrovascular accident) with associated mild right upper extremity hemiplegia   Cardiac pacemaker for history of intermittent heart block   Protein-calorie malnutrition, severe   Acute on chronic diastolic CHF (congestive heart failure)   Hypernatremia    Time spent: 25 minutes    Hollice Espy  [facility] Pager [phone]. If 7PM-7AM, please contact night-coverage at [email or web address], password [facility] 12/10/2012, 7:59 AM  LOS: 10 days

## 2012-12-10 NOTE — Progress Notes (Addendum)
Blood sugar 62 given OJ.  Asymptomatic.     Recheck of blood sugar 101 after snack.  No insulin given.  #20 g IV site established in left inner forearm.  Straight cath, urine is purulent with strong odor.  Tolerated procedures without incident.  Dr. Rito Ehrlich aware of change in blood sugar.  New orders written.  Alert and cooperative.  Medicated per order for low back pain.

## 2012-12-11 DIAGNOSIS — E1101 Type 2 diabetes mellitus with hyperosmolarity with coma: Secondary | ICD-10-CM

## 2012-12-11 DIAGNOSIS — N390 Urinary tract infection, site not specified: Secondary | ICD-10-CM

## 2012-12-11 LAB — GLUCOSE, CAPILLARY

## 2012-12-11 MED ORDER — ALBUTEROL SULFATE (5 MG/ML) 0.5% IN NEBU
2.5000 mg | INHALATION_SOLUTION | Freq: Three times a day (TID) | RESPIRATORY_TRACT | Status: DC
  Administered 2012-12-11 – 2012-12-12 (×2): 2.5 mg via RESPIRATORY_TRACT
  Filled 2012-12-11 (×2): qty 0.5

## 2012-12-11 MED ORDER — FUROSEMIDE 20 MG PO TABS
20.0000 mg | ORAL_TABLET | Freq: Two times a day (BID) | ORAL | Status: DC
  Administered 2012-12-11 – 2012-12-12 (×2): 20 mg via ORAL
  Filled 2012-12-11 (×5): qty 1

## 2012-12-11 NOTE — Progress Notes (Signed)
Physical Therapy Treatment Patient Details Name: Carmen [name] MRN: [id number] DOB: 1933-08-10 Today's Date: 12/11/2012 Time: 4098-1191 PT Time Calculation (min): 26 min  PT Assessment / Plan / Recommendation  History of Present Illness 79 y.o. female with PMH of Chronic diastolic CHF, DM, Poorly controlled HTN is brought to the ER by her grand daughter today who she has been staying with since Thursday..   PT Comments   Pt admitted with above. Pt currently with functional limitations due to balance and endurance deficits.  Pt will benefit from skilled PT to increase their independence and safety with mobility to allow discharge to the venue listed below.   Follow Up Recommendations  SNF;Supervision/Assistance - 24 hour                 Equipment Recommendations  None recommended by PT        Frequency Min 3X/week   Progress towards PT Goals Progress towards PT goals: Progressing toward goals  Plan Current plan remains appropriate    Precautions / Restrictions Precautions Precautions: Fall Precaution Comments: on 2 LO2 via  Restrictions Weight Bearing Restrictions: No   Pertinent Vitals/Pain VSS, no pain    Mobility  Bed Mobility Bed Mobility: Supine to Sit Supine to Sit: 4: Min guard;HOB flat Sitting - Scoot to Edge of Bed: 4: Min guard Details for Bed Mobility Assistance: Pt did not need assist to get to EOB. Transfers Transfers: Sit to Stand;Stand to Sit Sit to Stand: 4: Min assist;With upper extremity assist;From bed Stand to Sit: 4: Min assist;With upper extremity assist;With armrests;To chair/3-in-1 Stand Pivot Transfers: Not tested (comment) Details for Transfer Assistance: (A) to initiate transfer with cues for hand placement. Increase (A) to slowly descend to recliner.  Ambulation/Gait Ambulation/Gait Assistance: 4: Min assist Ambulation Distance (Feet): 150 Feet Assistive device: Rolling walker Ambulation/Gait Assistance Details: Pt needed assist to  maintain balance with max cues for upright posture, RW placement, and for gait speed.  Pt ambulates very slowly.  Pt ambulated without the O2 - 93% and > O2 sat on RA with ambulation.  Gait Pattern: Step-through pattern;Decreased stride length;Shuffle;Trunk flexed Gait velocity: Gait velocity at 0.4795 thus pt at recurrent fall risk, taking incr time to ambulate.   Stairs: No Wheelchair Mobility Wheelchair Mobility: No    PT Goals (current goals can now be found in the care plan section)    Visit Information  Last PT Received On: 12/11/12 Assistance Needed: +1 History of Present Illness: 79 y.o. female with PMH of Chronic diastolic CHF, DM, Poorly controlled HTN is brought to the ER by her grand daughter today who she has been staying with since Thursday..    Subjective Data  Subjective: "I want to walk."   Cognition  Cognition Arousal/Alertness: Awake/alert Behavior During Therapy: WFL for tasks assessed/performed Overall Cognitive Status: Within Functional Limits for tasks assessed    Balance  Static Sitting Balance Static Sitting - Balance Support: No upper extremity supported;Feet supported Static Sitting - Level of Assistance: 5: Stand by assistance Static Sitting - Comment/# of Minutes: 3  End of Session PT - End of Session Equipment Utilized During Treatment: Gait belt;Oxygen Activity Tolerance: Patient tolerated treatment well Patient left: in chair;with call bell/phone within reach Nurse Communication: Mobility status        INGOLD,Ronte Parker 12/11/2012, 12:56 PM [facility] [phone] (pager)

## 2012-12-11 NOTE — Progress Notes (Signed)
[facility] PROGRESS NOTE  Carmen Carmen [name] Carmen [name]:[id number] DOB: [birth year] DOA: 11/30/2012 PCP: Gwynneth Aliment, MD  Assessment/Plan: Principal Problem:   Acute respiratory failure: Secondary to CHF exacerbation. Has diuresed almost 3 L. Lasix stopped because of hyponatremia. We started Lasix given Overload additional  Active Problems:   HTN (hypertension), malignant: Better controlled today. Continue Cardizem, metoprolol and clonidine. Blood pressure started to rise again, likely from additional volume overload. We started Lasix gently Meds.  UTI: Started on IV Rocephin. Awaiting cultures given that this UTI developed while she was in the hospital.    DM (diabetes mellitus), type 2, uncontrolled, periph vascular complic: Patient continues to have episodes of hypoglycemia. Have been further restricting her insulin 70/30. Hyperglycemia felt to be secondary UTI, hopefully this will improve now that we are treating it   Peripheral neuropathy: Stable.    History of CVA (cerebrovascular accident) with associated mild right upper extremity hemiplegia: Chronic. Stable.    Cardiac pacemaker for history of intermittent heart block   Protein-calorie malnutrition, severe    Acute on chronic diastolic CHF (congestive heart failure): See above.    Hypernatremia: Started 9/27, likely secondary to overdiuresis. Patient started on D5W.  Recheck labs. Now resolved.  Dysphagia: Evaluated by speech therapy. With increased alertness, no evidence of swallow difficulty  Code Status: Full  Family Communication: Spoke with granddaughter today Disposition Plan: Skilled nursing likely tomorrow   Consultants:  [facility]  Procedures:  Intubated from 9/25-9/28  Has right IJ placed 9/25-10/4  Antibiotics:  None  HPI/Subjective: Patient doing okay. Complains of fatigue as well as uncomfortable in her bed.    Objective: Filed Vitals:   12/11/12 0920  BP:   Pulse: 61  Temp:    Resp:     Intake/Output Summary (Last 24 hours) at 12/11/12 0930 Last data filed at 12/11/12 0921  Gross per 24 hour  Intake    803 ml  Output    850 ml  Net    -47 ml   Filed Weights   12/09/12 0500 12/10/12 0616 12/11/12 0707  Weight: 55.5 kg (122 lb 5.7 oz) 53.978 kg (119 lb) 53.343 kg (117 lb 9.6 oz)    Exam:   General:  Alert and oriented x2, no acute distress  Cardiovascular: Regular rate and rhythm, paced, 2/6 systolic ejection murmur  Respiratory: Clear auscultation bilaterally  Abdomen: Soft, nontender, nondistended, positive bowel sounds  Musculoskeletal: No clubbing or cyanosis, trace pitting edema   Data Reviewed: Basic Metabolic Panel:  Recent Labs Lab 12/05/12 0400  12/06/12 2300 12/07/12 0500 12/08/12 0648 12/09/12 2045 12/10/12 0500  NA 156*  < > 146* 154* 148* 136 139  K 3.8  < > 3.9 3.2* 3.7 5.2* 4.6  CL 120*  < > 112 118* 114* 104 106  CO2 30  < > 26 27 26 24 26   GLUCOSE 148*  < > 594* 83 44* 197* 219*  BUN 42*  < > 34* 31* 27* 24* 23  CREATININE 1.26*  < > 1.18* 1.10 1.12* 1.14* 1.09  CALCIUM 8.9  < > 8.2* 8.5 8.4 8.1* 7.8*  MG 2.1  --   --  1.9 1.9  --   --   PHOS 3.0  --   --  2.7  --   --   --   < > = values in this interval not displayed. Liver Function Tests:  Recent Labs Lab 12/06/12 0420  AST 32  ALT 33  ALKPHOS 93  BILITOT 0.3  PROT 6.1  ALBUMIN 2.3*   CBC:  Recent Labs Lab 12/05/12 0400 12/06/12 0420 12/10/12 1440  WBC 6.1 4.4 4.4  HGB 13.0 13.0 11.8*  HCT 38.4 39.6 34.3*  MCV 100.8* 101.5* 96.6  PLT 150 158 193   BNP (last 3 results)  Recent Labs  08/12/12 1112 11/30/12 1509  PROBNP 2540.0* 10923.0*   CBG:  Recent Labs Lab 12/10/12 1108 12/10/12 1141 12/10/12 1610 12/10/12 2143 12/11/12 0908  GLUCAP 62* 101* 151* 76 230*    Recent Results (from the past 240 hour(s))  URINE CULTURE     Status: None   Collection Time    12/01/12 11:08 PM      Result Value Range Status   Specimen  Description URINE, CATHETERIZED   Final   Special Requests NONE   Final   Culture  Setup Time     Final   Value: 12/02/2012 00:43     Performed at [facility] Count     Final   Value: NO GROWTH     Performed at [facility]   Culture     Final   Value: NO GROWTH     Performed at [facility]   Report Status 12/03/2012 FINAL   Final  CULTURE, BLOOD (ROUTINE X 2)     Status: None   Collection Time    12/01/12 11:30 PM      Result Value Range Status   Specimen Description BLOOD RIGHT HAND   Final   Special Requests BOTTLES DRAWN AEROBIC ONLY 10CC   Final   Culture  Setup Time     Final   Value: 12/02/2012 05:56     Performed at [facility]   Culture     Final   Value: NO GROWTH 5 DAYS     Performed at [facility]   Report Status 12/08/2012 FINAL   Final  CULTURE, BLOOD (ROUTINE X 2)     Status: None   Collection Time    12/01/12 11:48 PM      Result Value Range Status   Specimen Description BLOOD LEFT HAND   Final   Special Requests BOTTLES DRAWN AEROBIC ONLY 3CC   Final   Culture  Setup Time     Final   Value: 12/02/2012 05:55     Performed at [facility]   Culture     Final   Value: NO GROWTH 5 DAYS     Performed at [facility]   Report Status 12/08/2012 FINAL   Final     Studies: No results found.  Scheduled Meds: . albuterol  2.5 mg Nebulization QID  . aspirin EC  81 mg Oral Daily  . cefTRIAXone (ROCEPHIN)  IV  1 g Intravenous Q24H  . cloNIDine  0.2 mg Oral BID  . diltiazem  300 mg Oral Daily  . enoxaparin (LOVENOX) injection  30 mg Subcutaneous Q24H  . feeding supplement  1 Container Oral QPC lunch  . insulin aspart  0-5 Units Subcutaneous QHS  . insulin aspart  0-9 Units Subcutaneous TID WC  . insulin aspart protamine- aspart  6 Units Subcutaneous BID WC  . isosorbide mononitrate  120 mg Oral Daily  . losartan  100 mg Oral Daily  . metoprolol tartrate  100 mg Oral BID  . pantoprazole  40  mg Oral Daily  . sodium chloride  3 mL Intravenous Q12H  . spironolactone  25 mg Oral Daily  Continuous Infusions:    Principal Problem:   Acute respiratory failure Active Problems:   HTN (hypertension), malignant   DM (diabetes mellitus), type 2, uncontrolled, periph vascular complic   Peripheral neuropathy   History of CVA (cerebrovascular accident) with associated mild right upper extremity hemiplegia   Cardiac pacemaker for history of intermittent heart block   Protein-calorie malnutrition, severe   Acute on chronic diastolic CHF (congestive heart failure)   Hypernatremia   UTI (urinary tract infection)    Time spent: 25 minutes    Hollice Espy  [facility] Pager [phone]. If 7PM-7AM, please contact night-coverage at [email or web address], password [facility] 12/11/2012, 9:30 AM  LOS: 11 days

## 2012-12-11 NOTE — Progress Notes (Signed)
Per discussion with Dr. Rito Ehrlich- anticipate d/c to SNF at [facility] tomorrow if medically stable.  CSW contacted Admissions at [facility]- bed will be available for tomorrow.   CSW spoke with patient's daughter Stanton Kidney  [phone]. She stated that she was coming to the hospital to talk to her mother but she and the family are very concerned that patient will have co-pays at the nursing center that patient cannot afford.  Daughter stated that her mother already has many medical bills that she cannot pay and requested that CSW call her in the morning as they will decide tonight to either proceed with short term SNF or go home with Home Health. Patient's husband is in the home 24/7 and daughter feels that family could support her returning home.  CSW will monitor in the a.m.  Lorri Frederick. [place]  [phone]

## 2012-12-12 LAB — URINE CULTURE: Colony Count: >=100000

## 2012-12-12 LAB — BASIC METABOLIC PANEL
BUN: 22 mg/dL (ref 6–23)
Creatinine, Ser: 1.23 mg/dL — ABNORMAL HIGH (ref 0.50–1.10)
GFR calc Af Amer: 48 mL/min — ABNORMAL LOW (ref >90)
GFR calc non Af Amer: 41 mL/min — ABNORMAL LOW (ref >90)

## 2012-12-12 MED ORDER — ISOSORBIDE MONONITRATE ER 120 MG PO TB24: 120.0000 mg | ORAL_TABLET | Freq: Every day | ORAL | Status: DC

## 2012-12-12 MED ORDER — INSULIN NPH ISOPHANE & REGULAR (70-30) 100 UNIT/ML ~~LOC~~ SUSP: 10.0000 [IU] | Freq: Two times a day (BID) | SUBCUTANEOUS | Status: DC

## 2012-12-12 MED ORDER — CIPROFLOXACIN HCL 250 MG PO TABS: 250.0000 mg | ORAL_TABLET | Freq: Two times a day (BID) | ORAL | Status: DC

## 2012-12-12 MED ORDER — CLONIDINE HCL 0.2 MG PO TABS: 0.2000 mg | ORAL_TABLET | Freq: Two times a day (BID) | ORAL | Status: DC

## 2012-12-12 MED ORDER — DILTIAZEM HCL ER 120 MG PO CP12: 120.0000 mg | ORAL_CAPSULE | Freq: Two times a day (BID) | ORAL | Status: DC

## 2012-12-12 MED ORDER — SPIRONOLACTONE 25 MG PO TABS: 25.0000 mg | ORAL_TABLET | Freq: Every day | ORAL | Status: DC

## 2012-12-12 NOTE — Discharge Summary (Signed)
Physician Discharge Summary  Carmen [name]:[id number] DOB: 24-Feb-1934 DOA: 11/30/2012  PCP: Gwynneth Aliment, MD  Admit date: 11/30/2012 Discharge date: 12/12/2012  Time spent: 35 minutes  Recommendations for Outpatient Follow-up:  1. Patient was put on a number of new blood pressure medications: Spironolactone 25 by mouth daily, clonidine 0.1 twice a day, Cardizem 120 twice a day 2. Her Lasix, labetalol are being discontinued 3. She is being discharged to skilled nursing facility 4. She will continue on 5 more days of Cipro twice a day for total of 10 days of therapy 5. Patient will followup with Dr.Jay Ganji in the next 7 days  Discharge Diagnoses:  Principal Problem:   Acute respiratory failure Active Problems:   HTN (hypertension), malignant   DM (diabetes mellitus), type 2, uncontrolled, periph vascular complic   Peripheral neuropathy   History of CVA (cerebrovascular accident) with associated mild right upper extremity hemiplegia   Cardiac pacemaker for history of intermittent heart block   Protein-calorie malnutrition, severe   Acute on chronic diastolic CHF (congestive heart failure)   Hypernatremia   UTI (urinary tract infection)   Discharge Condition: Improved, being discharged to skilled nursing facility  Diet recommendation: Carb modified heart healthy  Filed Weights   12/10/12 0616 12/11/12 0707 12/12/12 0450  Weight: 53.978 kg (119 lb) 53.343 kg (117 lb 9.6 oz) 52.935 kg (116 lb 11.2 oz)    History of present illness:  79 year old African American female past medical history of chronic diastolic heart failure, poorly controlled hypertension diabetes mellitus was brought in by her granddaughter on 9/25 after a fall and secondary drowsiness. EMS noted CBG to be 44 and patient was given D50. In the emergency room, patient was noted to have blood pressure 217/107, respiratory rate 30, potassium 2.7 and BNP of almost 11,000 with chest x-ray consistent with CHF.  Patient was admitted to the hospital service, placed the step down unit started on nitro drip plus Lasix and insulin was put on hold given hypoglycemia. Later on that day, patient went into worsening respiratory failure. Critical care was consulted and patient was intubated and transferred to ICU on ventilator.  Hospital Course:  Principal Problem:   Acute respiratory failure: Patient spent approximately the next 5-6 days on ventilator and was able to be extubated. Cardiology was consulted and patient was aggressively diuresed. She was then able to be transferred to the hospitalist service on 10/1 the evening. She has remained stable from a respiratory standpoint since Active Problems:   HTN (hypertension), malignant: Patient has always had a difficult time controlling her blood pressure. Following diuresis, cardiology restarted her Cozaar. She was noted to be on 2 beta blockers, labetalol was discontinued and metoprolol was continued. She's put on spironolactone in place of Lasix. Cardizem and clonidine were added as well as Imdur.  To better keep her blood pressure consistently down versus severe highs 1 medicine starts to wear off, clonidine, Cardizem and metoprolol will be given twice a day    DM (diabetes mellitus), type 2, uncontrolled, periph vascular complic: Initially patient was noted to be hypoglycemic. Insulin 70/30 was initially held and then restarted at lower rate. Patient continued to have persistent hypoglycemia and infection was suspected. Urinalysis was checked on 10/4 noted to be positive for UTI. See below. Following initial treatment with antibiotics, CBG starting to improve. Patient is being discharged on a decreased dose of her insulin 70/30 now at 10 units twice a day. This medication may need to be titrated up if  her blood sugars increase.    Peripheral neuropathy: Chronic.    History of CVA (cerebrovascular accident) with associated mild right upper extremity hemiplegia:  Stable.    Cardiac pacemaker for history of intermittent heart block    Protein-calorie malnutrition, severe: Patient was evaluated by nutrition who recommended Ensure shakes 3 times a day    Acute on chronic diastolic CHF (congestive heart failure): Patient was aggressively diuresed while intubated as management per cardiology. Over the next 5-6 days, she diuresed approximately 3.5 L    Hypernatremia: On aggressive diuresis, patient's sodium had started to significantly increase, peaking as high as 154. Patient was changed over to half normal saline and Lasix was held. Her sodium continued to improve and by day of discharge is down to 136.    UTI (urinary tract infection): When patient started having persistent hypoglycemia, infection was suspected. UA positive for UTI which grew out pansensitive Escherichia coli. Patient on several days of IV Rocephin and by discharge changed over to Cipro by mouth twice a day to complete a total of 7 days of therapy   Procedures: Intubated from 9/25-9/28  Has right IJ placed 9/25-10/4   Consultations:  Cardiology-Ganji  Critical care  Discharge Exam: Filed Vitals:   12/12/12 1300  BP: 104/50  Pulse:   Temp:   Resp:     General: Alert and oriented x2, no acute distress Cardiovascular: Regular rate and rhythm, S1-S2, 2/6 systolic ejection murmur Respiratory: Clear to auscultation bilaterally Abdomen: Soft, nontender, nondistended, positive bowel sounds : Extremities no clubbing or cyanosis, trace edema  Discharge Instructions  Discharge Orders   Future Orders Complete By Expires   Diet - low sodium heart healthy  As directed    Diet Carb Modified  As directed    Increase activity slowly  As directed        Medication List    STOP taking these medications       furosemide 40 MG tablet  Commonly known as:  LASIX     labetalol 200 MG tablet  Commonly known as:  NORMODYNE      TAKE these medications        acetaminophen-codeine 300-30 MG per tablet  Commonly known as:  TYLENOL #3  Take 0.5 tablets by mouth daily as needed for pain.     aspirin EC 81 MG tablet  Take 81 mg by mouth daily.     ciprofloxacin 250 MG tablet  Commonly known as:  CIPRO  Take 1 tablet (250 mg total) by mouth 2 (two) times daily.     cloNIDine 0.2 MG tablet  Commonly known as:  CATAPRES  Take 1 tablet (0.2 mg total) by mouth 2 (two) times daily.     diltiazem 120 MG 12 hr capsule  Commonly known as:  CARDIZEM SR  Take 1 capsule (120 mg total) by mouth 2 (two) times daily.     insulin NPH-regular (70-30) 100 UNIT/ML injection  Commonly known as:  NOVOLIN 70/30  Inject 10 Units into the skin 2 (two) times daily with a meal.     isosorbide mononitrate 120 MG 24 hr tablet  Commonly known as:  IMDUR  Take 1 tablet (120 mg total) by mouth daily.     losartan 100 MG tablet  Commonly known as:  COZAAR  Take 1 tablet (100 mg total) by mouth daily.     metoprolol 100 MG tablet  Commonly known as:  LOPRESSOR  Take 100 mg by mouth 2 (two) times  daily.     omega-3 acid ethyl esters 1 G capsule  Commonly known as:  LOVAZA  Take 1 g by mouth 3 (three) times daily.     spironolactone 25 MG tablet  Commonly known as:  ALDACTONE  Take 1 tablet (25 mg total) by mouth daily.       Allergies  Allergen Reactions  . Norvasc [Amlodipine Besylate] Swelling  . Lasix [Furosemide]     Caused a problem with her kidneys. However, pt is taking daily at home.  . Peanut-Containing Drug Products Other (See Comments)    Cause my stomach to hurt       Follow-up Information   Follow up with Gwynneth Aliment, MD In 1 month.   Specialty:  Internal Medicine   Contact information:   [address] [place] [address] [phone]        The results of significant diagnostics from this hospitalization (including imaging, microbiology, ancillary and laboratory) are listed below for reference.    Significant  Diagnostic Studies: Dg Chest Port 1 View  12/05/2012   CLINICAL DATA:  Cough, shortness of Breath.  EXAM: PORTABLE CHEST - 1 VIEW  COMPARISON:  12/03/2012  FINDINGS: Support devices are stable. Cardiomegaly. Bibasilar atelectasis or infiltrates have increased since prior study. Vascular congestion. Question small effusions.  IMPRESSION: Increasing bibasilar atelectasis or infiltrates. Suspect small effusions.   Electronically Signed   By: Charlett Nose M.D.   On: 12/05/2012 05:48   Dg Chest Port 1 View  12/03/2012   CLINICAL DATA:  Intubation, evaluate endotracheal tube placement, history hypertension, diabetes, coronary artery disease, stroke  EXAM: PORTABLE CHEST - 1 VIEW  COMPARISON:  Portable exam 0759 hr compared to 12/02/2012  FINDINGS: Tip of endotracheal tube in the left mainstem bronchus; recommend withdrawal 3-4 cm.  Left subclavian transvenous pacemaker leads project over right atrium and right ventricle.  Nasogastric tube extends into stomach.  Enlargement of cardiac silhouette with pulmonary vascular congestion.  Perihilar infiltrates compatible with pulmonary edema and CHF, little changed.  Question accompanying atelectasis in left lower lobe.  Tip of right jugular line projects over SVC.  No definite pleural effusion or pneumothorax.  IMPRESSION: Persistent CHF.  Left mainstem bronchus intubation; recommend withdrawal of endotracheal tube 3-4 cm.  Findings called to [place] RN on 2H on 12/03/2012 at 0925 hr.   Electronically Signed   By: Ulyses Southward M.D.   On: 12/03/2012 09:26   Dg Chest Port 1 View  12/02/2012   *RADIOLOGY REPORT*  Clinical Data: Endotracheal tube placement  PORTABLE CHEST - 1 VIEW  Comparison: Prior chest x-ray 12/01/2012  Findings: The tip of endotracheal tube is 2 cm above the carina. Right IJ vascular sheath catheter in similar position with the tip in the mid SVC.  Nasogastric tube is present.  The tip projects over the stomach.  Left subclavian approach cardiac rhythm  maintenance device in unchanged position.  Leads project over the right atrium and right ventricle.  Stable borderline cardiomegaly with left ventricular prominence.  Persistent dense left basilar opacity with small pleural effusion.  Mild patchy opacity in the right base.  No pneumothorax.  Atherosclerotic calcifications noted in the transverse aorta.  IMPRESSION:  1.  Continued incremental improvement and background interstitial edema and right basilar atelectasis versus infiltrate. 2.  Persistent dense left basilar opacity and small left effusion. Opacity may reflect pleural fluid combined with atelectasis and / or infiltrate. 3.  Stable and satisfactory support apparatus.   Original Report  Authenticated By: Malachy Moan, M.D.   Dg Chest Port 1 View  12/01/2012   CLINICAL DATA:  Fever.  EXAM: PORTABLE CHEST - 1 VIEW  COMPARISON:  Earlier today at 511 hr  FINDINGS: 2155 hr. Nasogastric tube extends beyond the inferior aspect of the film. Endotracheal tube terminates appropriately. Pacer. Right internal jugular line is poorly visualized centrally. Cardiomegaly accentuated by AP portable technique. Small left pleural effusion is similar. Slight improvement in mild interstitial edema. Slight improvement right base atelectasis. Dense left base airspace disease persists.  IMPRESSION: Slight improvement in interstitial edema and right base atelectasis.  Similar left pleural effusion with adjacent atelectasis or infection.   Electronically Signed   By: Jeronimo Greaves   On: 12/01/2012 22:35   Portable Chest Xray In Am  12/01/2012   CLINICAL DATA:  Endotracheal tube placement.  EXAM: PORTABLE CHEST - 1 VIEW  COMPARISON:  11/30/2012  FINDINGS: Endotracheal tube ends 3 cm above the carina. Right IJ catheter in stable position. Enteric tube crosses the diaphragm. No interval displacement of dual-chamber left approach pacer wires.  Unchanged heart enlargement and heart shape. There is decreased interstitial prominence,  with persistent bibasilar opacities, partly pleural fluid on the left. Larger appearing left effusion may be from patient positioning. No evidence of pneumothorax. Osteopenia and advanced right glenohumeral osteoarthritis. Vascular stenting at the thoracic inlet.  IMPRESSION: 1. Stable positioning of tubes and lines. 2. Improved pulmonary edema. 3. A left pleural effusion appears larger today, which may be related to differences in patient positioning. 4. Lower lung atelectasis or consolidation.   Electronically Signed   By: Tiburcio Pea   On: 12/01/2012 05:46   Dg Chest Port 1 View  11/30/2012   CLINICAL DATA:  Central line placement.  EXAM: PORTABLE CHEST - 1 VIEW  COMPARISON:  Earlier same date. CT 08/12/2012.  FINDINGS: 2244 hr. There is mild patient rotation to the left. The tip of the endotracheal tube is not well visualized due to overlap with other support structures. However, it appears lower, within 3 cm of the carina. There is a new right IJ central venous catheter extending to the lower SVC level. Nasogastric tube projects into the stomach. Left subclavian pacemaker leads appear unchanged.  Pulmonary edema has slightly improved. There is persistent retrocardiac airspace disease and a probable small left pleural effusion. There is no pneumothorax. The heart size and mediastinal contours are stable.  IMPRESSION: 1. Central line placement as described. No pneumothorax. 2. Endotracheal tube tip is not well visualized, but appears lower, within 3 cm of the carina. 3. Improved pulmonary edema.   Electronically Signed   By: Roxy Horseman   On: 11/30/2012 23:06   Dg Chest Port 1 View  11/30/2012   CLINICAL DATA:  Intubated.  EXAM: PORTABLE CHEST - 1 VIEW  COMPARISON:  Earlier today.  FINDINGS: Interval endotracheal tube with its tip 4.6 cm above the carina. Interval nasogastric tube extending into the stomach. The cardiac silhouette remains enlarged. Increased prominence of the interstitial markings with  some alveolar opacities bilaterally. Stable left subclavian pacemaker leads. Stable dense opacity in the left lower lobe. Diffuse osteopenia and right shoulder degenerative changes.  IMPRESSION: 1. Progressive changes of acute congestive heart failure. 2. Stable dense left lower lobe atelectasis or pneumonia.   Electronically Signed   By: Gordan Payment   On: 11/30/2012 20:42   Dg Chest Portable 1 View  11/30/2012   CLINICAL DATA:  Shortness of breath. Smoker.  EXAM: PORTABLE CHEST -  1 VIEW  COMPARISON:  Previous examinations.  FINDINGS: The cardiac silhouette remains at mildly enlarged. The pulmonary vasculature and interstitial markings are prominent with mild progression of the interstitial prominence since 08/12/2012. Small left pleural effusion. Stable left subclavian pacemaker leads. Diffuse osteopenia and right shoulder degenerative changes.  IMPRESSION: Mild changes of acute congestive heart failure superimposed on chronic interstitial lung disease.   Electronically Signed   By: Gordan Payment   On: 11/30/2012 15:49    Microbiology: Recent Results (from the past 240 hour(s))  URINE CULTURE     Status: None   Collection Time    12/10/12  1:58 PM      Result Value Range Status   Specimen Description URINE, CATHETERIZED   Final   Special Requests NONE   Final   Culture  Setup Time     Final   Value: 12/10/2012 23:11     Performed at [facility]   Colony Count     Final   Value: >=100,000 COLONIES/ML     Performed at [facility]   Culture     Final   Value: ESCHERICHIA COLI     Performed at [facility]   Report Status 12/12/2012 FINAL   Final   Organism ID, Bacteria ESCHERICHIA COLI   Final     Labs: Basic Metabolic Panel:  Recent Labs Lab 12/07/12 0500 12/08/12 0648 12/09/12 2045 12/10/12 0500 12/12/12 0540  NA 154* 148* 136 139 136  K 3.2* 3.7 5.2* 4.6 4.6  CL 118* 114* 104 106 104  CO2 27 26 24 26 23   GLUCOSE 83 44* 197* 219* 142*  BUN 31* 27* 24*  23 22  CREATININE 1.10 1.12* 1.14* 1.09 1.23*  CALCIUM 8.5 8.4 8.1* 7.8* 8.2*  MG 1.9 1.9  --   --   --   PHOS 2.7  --   --   --   --    Liver Function Tests:  Recent Labs Lab 12/06/12 0420  AST 32  ALT 33  ALKPHOS 93  BILITOT 0.3  PROT 6.1  ALBUMIN 2.3*   No results found for this basename: LIPASE, AMYLASE,  in the last 168 hours No results found for this basename: AMMONIA,  in the last 168 hours CBC:  Recent Labs Lab 12/06/12 0420 12/10/12 1440  WBC 4.4 4.4  HGB 13.0 11.8*  HCT 39.6 34.3*  MCV 101.5* 96.6  PLT 158 193   Cardiac Enzymes: No results found for this basename: CKTOTAL, CKMB, CKMBINDEX, TROPONINI,  in the last 168 hours BNP: BNP (last 3 results)  Recent Labs  08/12/12 1112 11/30/12 1509 12/11/12 1400  PROBNP 2540.0* 10923.0* 3706.0*   CBG:  Recent Labs Lab 12/11/12 1151 12/11/12 1623 12/11/12 2105 12/12/12 0646 12/12/12 1125  GLUCAP 129* 210* 177* 125* 198*       Signed:  KRISHNAN,SENDIL K  [facility] 12/12/2012, 1:30 PM

## 2012-12-12 NOTE — Clinical Social Work Placement (Signed)
     Clinical Social Work Department CLINICAL SOCIAL WORK PLACEMENT NOTE 12/12/2012  Patient:  Carmen [name], Carmen [name]  Account Number:  [id number] Admit date:  11/30/2012  Clinical Social Worker:  Maryclare Labrador, Theresia Majors  Date/time:  12/06/2012 03:28 PM  Clinical Social Work is seeking post-discharge placement for this patient at the following level of care:   SKILLED NURSING   (*CSW will update this form in Epic as items are completed)     Patient/family provided with [facility] Department of Clinical Social Works list of facilities offering this level of care within the geographic area requested by the patient (or if unable, by the patients family).  12/06/2012  Patient/family informed of their freedom to choose among providers that offer the needed level of care, that participate in Medicare, Medicaid or managed care program needed by the patient, have an available bed and are willing to accept the patient.    Patient/family informed of [facility] ownership interest in [facility], as well as of the fact that they are under no obligation to receive care at this facility.  PASARR submitted to EDS on 12/06/2012 PASARR number received from EDS on 12/06/2012  FL2 transmitted to all facilities in geographic area requested by pt/family on  12/06/2012 FL2 transmitted to all facilities within larger geographic area on   Patient informed that his/her managed care company has contracts with or will negotiate with  certain facilities, including the following:     Patient/family informed of bed offers received:  12/11/2012 Patient chooses bed at [facility] & REHABILITATION Physician recommends and patient chooses bed at    Patient to be transferred to [facility] on  12/12/2012 Patient to be transferred to facility by Ambulance  Sharin Mons)  The following physician request were entered in Epic:   Additional Comments: 12/12/12  Ok per MD for d/c today to SNF.  Patient and family are aware and prlesed with the d/c plan.  Nursing notified and will call report.  Ok per SNF for patient to be admitted. No further CSW needs identified. CSW signing off. Lorri Frederick. Rilie Glanz, LCSWA [phone]

## 2012-12-12 NOTE — Plan of Care (Signed)
Problem: Discharge Progression Outcomes Goal: Independent ADLs or Home Health Care Outcome: Adequate for Discharge [facility]

## 2012-12-12 NOTE — Progress Notes (Signed)
Pt alert and oriented times 4, grand daughter at bedside, B/P elevated this am, Medication given, rechecked B/P, 104/50, NAD noted, report called to Gilda Crease at [place], all questions answered, awaiting transportation to facility.

## 2012-12-12 NOTE — Progress Notes (Signed)
Physical Therapy Treatment Patient Details Name: Carmen [name] MRN: [id number] DOB: 1934-02-03 Today's Date: 12/12/2012 Time: 2595-6387 PT Time Calculation (min): 18 min  PT Assessment / Plan / Recommendation  History of Present Illness 79 y.o. female with PMH of Chronic diastolic CHF, DM, Poorly controlled HTN is brought to the ER by her grand daughter today who she has been staying with since Thursday..   PT Comments   Patient motivated and loves to walk. Awaiting SNF at this time. Continue with current POC  Follow Up Recommendations  SNF;Supervision/Assistance - 24 hour     Does the patient have the potential to tolerate intense rehabilitation     Barriers to Discharge        Equipment Recommendations  None recommended by PT    Recommendations for Other Services    Frequency Min 3X/week   Progress towards PT Goals Progress towards PT goals: Progressing toward goals  Plan Current plan remains appropriate    Precautions / Restrictions Precautions Precautions: Fall Precaution Comments: on 2 LO2 via [place]   Pertinent Vitals/Pain Complains of back pain. RN made aware    Mobility  Bed Mobility Supine to Sit: 4: Min guard;HOB flat Sitting - Scoot to Edge of Bed: 4: Min guard Details for Bed Mobility Assistance: Pt did not need assist to get to EOB. Transfers Sit to Stand: 4: Min assist;With upper extremity assist;From bed Stand to Sit: 4: Min assist;With upper extremity assist;With armrests;To chair/3-in-1 Details for Transfer Assistance: (A) to initiate transfer with cues for hand placement. Increase (A) to slowly descend to recliner.  Ambulation/Gait Ambulation/Gait Assistance: 4: Min assist Ambulation Distance (Feet): 200 Feet Assistive device: Rolling walker Ambulation/Gait Assistance Details: Cues for upright posture. Patient with back pain stating causes her to walk with kyphotic posture Gait Pattern: Step-through pattern;Decreased stride length;Shuffle;Trunk  flexed Gait velocity: decreased    Exercises     PT Diagnosis:    PT Problem List:   PT Treatment Interventions:     PT Goals (current goals can now be found in the care plan section)    Visit Information  Last PT Received On: 12/12/12 Assistance Needed: +1 History of Present Illness: 79 y.o. female with PMH of Chronic diastolic CHF, DM, Poorly controlled HTN is brought to the ER by her grand daughter today who she has been staying with since Thursday..    Subjective Data      Cognition  Cognition Arousal/Alertness: Awake/alert Behavior During Therapy: WFL for tasks assessed/performed Overall Cognitive Status: Within Functional Limits for tasks assessed    Balance     End of Session PT - End of Session Equipment Utilized During Treatment: Gait belt;Oxygen Activity Tolerance: Patient tolerated treatment well Patient left: in chair;with call bell/phone within reach Nurse Communication: Mobility status   GP     Fredrich Birks 12/12/2012, 2:21 PM  12/12/2012 Fredrich Birks PTA [phone] pager [phone] office

## 2012-12-13 ENCOUNTER — Non-Acute Institutional Stay (SKILLED_NURSING_FACILITY): Payer: Medicare Other | Admitting: Internal Medicine

## 2012-12-13 ENCOUNTER — Encounter: Payer: Self-pay | Admitting: Internal Medicine

## 2012-12-13 ENCOUNTER — Other Ambulatory Visit: Payer: Self-pay | Admitting: *Deleted

## 2012-12-13 DIAGNOSIS — E1151 Type 2 diabetes mellitus with diabetic peripheral angiopathy without gangrene: Secondary | ICD-10-CM

## 2012-12-13 DIAGNOSIS — I5033 Acute on chronic diastolic (congestive) heart failure: Secondary | ICD-10-CM

## 2012-12-13 DIAGNOSIS — I639 Cerebral infarction, unspecified: Secondary | ICD-10-CM

## 2012-12-13 DIAGNOSIS — I509 Heart failure, unspecified: Secondary | ICD-10-CM

## 2012-12-13 DIAGNOSIS — I10 Essential (primary) hypertension: Secondary | ICD-10-CM

## 2012-12-13 DIAGNOSIS — I6350 Cerebral infarction due to unspecified occlusion or stenosis of unspecified cerebral artery: Secondary | ICD-10-CM

## 2012-12-13 DIAGNOSIS — M1990 Unspecified osteoarthritis, unspecified site: Secondary | ICD-10-CM | POA: Insufficient documentation

## 2012-12-13 DIAGNOSIS — I441 Atrioventricular block, second degree: Secondary | ICD-10-CM

## 2012-12-13 DIAGNOSIS — E1159 Type 2 diabetes mellitus with other circulatory complications: Secondary | ICD-10-CM

## 2012-12-13 DIAGNOSIS — I798 Other disorders of arteries, arterioles and capillaries in diseases classified elsewhere: Secondary | ICD-10-CM

## 2012-12-13 DIAGNOSIS — N390 Urinary tract infection, site not specified: Secondary | ICD-10-CM

## 2012-12-13 MED ORDER — ACETAMINOPHEN-CODEINE #3 300-30 MG PO TABS: ORAL_TABLET | ORAL | Status: DC

## 2012-12-13 NOTE — Assessment & Plan Note (Signed)
May have been one of the causes of pt's hypoglycemia-pt is on Cipro 250 mg BID until 10/12

## 2012-12-13 NOTE — Assessment & Plan Note (Signed)
Pt has pacemaker in place

## 2012-12-13 NOTE — Assessment & Plan Note (Signed)
According to family anything below SBP 200 is good;hospital note confirmed this;pt's meds were tweeked while she was in hospital and we will continue these meds.

## 2012-12-13 NOTE — Progress Notes (Signed)
MRN: [id number] Name: Carmen [name]  Sex: female Age: 79 y.o. DOB: April 08, 1933  PSC #: Son[facility] Dandy Facility/Room: 119 Level Of Care: SNF Provider: Merrilee Seashore D Emergency Contacts: Extended Emergency Contact Information Primary Emergency Contact: Simmons,Trina Address: [address]          [place], [place] [address] [place] Home Phone: [phone] Mobile Phone: [phone] Relation: Grandaughter Secondary Emergency Contact: Liberati,James Address: [address]          [place], [place] [address] [place] Home Phone: [phone] Relation: Spouse  Code Status: FULL  Allergies: Norvasc; Lasix; and Peanut-containing drug products  Chief Complaint  Patient presents with  . nursing home admission    HPI: Patient is 79 y.o. female who is here for PT/OT and nutritional support after having been intubated for almost a week from acute CHF.  Past Medical History  Diagnosis Date  . Diabetes mellitus   . Hypertension   . Coronary artery disease   . Renal disorder   . Herniated lumbar intervertebral disc   . Cataract     cataract surgery scheduled for 03/31/11, 2 - left eye, 1 - right eye  . Renovascular hypertension      s/p left renal artery stent 12/2007.  S/P balloon angioplasty on 02/16/10 for ISR, BP was  controlled well since then.     . Claudication in peripheral vascular disease     02/16/10: Left CIA 9.0x28 Omnilink and REIA 8.0x40 seff expanding Zilver.   right subclavian artery stent 03/18/2008,  . S/P carotid endarterectomy      left carotid endarterectomy  1991; Stroke and TIA in 1999 with right sided weakness, now with residual right arm weakness  . Stroke     TIA  . Peripheral vascular disease   . Anxiety   . Arthritis     Past Surgical History  Procedure Laterality Date  . Appendectomy    . Abdominal hysterectomy    . Ureteral stent placement    . Carotid endarterectomy    . Laminectomy    . Back surgery    .  Colonoscopy  07/30/2011    Procedure: COLONOSCOPY;  Surgeon: Louis Meckel, MD;  Location: [facility] ENDOSCOPY;  Service: Endoscopy;  Laterality: N/A;  gi bleed  . Esophagogastroduodenoscopy  07/30/2011    Procedure: ESOPHAGOGASTRODUODENOSCOPY (EGD);  Surgeon: Louis Meckel, MD;  Location: [facility] ENDOSCOPY;  Service: Endoscopy;  Laterality: N/A;  . Pacemaker insertion        Medication List       This list is accurate as of: 12/13/12  3:01 PM.  Always use your most recent med list.               acetaminophen-codeine 300-30 MG per tablet  Commonly known as:  TYLENOL #3  Take 1/2 tablet by mouth daily as needed     aspirin EC 81 MG tablet  Take 81 mg by mouth daily.     ciprofloxacin 250 MG tablet  Commonly known as:  CIPRO  Take 250 mg by mouth 2 (two) times daily.     cloNIDine 0.2 MG tablet  Commonly known as:  CATAPRES  Take 1 tablet (0.2 mg total) by mouth 2 (two) times daily.     diltiazem 120 MG 12 hr capsule  Commonly known as:  CARDIZEM SR  Take 1 capsule (120 mg total) by mouth 2 (two) times daily.     insulin NPH-regular (70-30) 100 UNIT/ML injection  Commonly known as:  NOVOLIN 70/30  Inject 10 Units into the skin 2 (two) times daily with a meal.     isosorbide mononitrate 120 MG 24 hr tablet  Commonly known as:  IMDUR  Take 1 tablet (120 mg total) by mouth daily.     losartan 100 MG tablet  Commonly known as:  COZAAR  Take 1 tablet (100 mg total) by mouth daily.     metoprolol 100 MG tablet  Commonly known as:  LOPRESSOR  Take 100 mg by mouth 2 (two) times daily.     omega-3 acid ethyl esters 1 G capsule  Commonly known as:  LOVAZA  Take 1 g by mouth 3 (three) times daily.     spironolactone 25 MG tablet  Commonly known as:  ALDACTONE  Take 1 tablet (25 mg total) by mouth daily.        Meds ordered this encounter  Medications  . ciprofloxacin (CIPRO) 250 MG tablet    Sig: Take 250 mg by mouth 2 (two) times daily.    Immunization History   Administered Date(s) Administered  . Tdap 04/30/2012    History  Substance Use Topics  . Smoking status: Current Every Day Smoker -- 0.50 packs/day for 65 years    Types: Cigarettes  . Smokeless tobacco: Current User  . Alcohol Use: No    Family history is noncontributory    Review of Systems  DATA OBTAINED: from patient,  family member GENERAL: Feels well no fevers, fatigue, appetite changes SKIN: No itching, rash or wounds EYES: No eye pain, redness, discharge EARS: No earache, tinnitus, change in hearing NOSE: No congestion, drainage or bleeding  MOUTH/THROAT: No mouth or tooth pain, No sore throat, No difficulty chewing or swallowing  RESPIRATORY: No cough, wheezing, SOB CARDIAC: No chest pain, palpitations, lower extremity edema  GI: No abdominal pain, No N/V/D or constipation, No heartburn or reflux  GU: No dysuria, frequency or urgency, or incontinence  MUSCULOSKELETAL:c/o low back pain;family would like her to have more Tylenol #3 than she has been getting NEUROLOGIC: No headache, dizziness; mild RUE weakness PSYCHIATRIC: No overt anxiety or sadness. Sleeps well. No behavior issue.   Filed Vitals:   12/13/12 1433  BP: 186/90  Pulse: 60  Temp: 96.6 F (35.9 C)  Resp: 18    Physical Exam  GENERAL APPEARANCE: Alert, conversant. Appropriately groomed. No acute distress.  SKIN: No diaphoresis rash, or wounds HEAD: Normocephalic, atraumatic  EYES: Conjunctiva/lids clear. Pupils round, reactive. EOMs intact.  EARS: External exam WNL, canals clear. Hearing grossly normal.  NOSE: No deformity or discharge.  MOUTH/THROAT: Lips w/o lesions. RESPIRATORY: Breathing is even, unlabored. Lung sounds are clear   CARDIOVASCULAR: Heart RRR no murmurs, rubs or gallops. No peripheral edema.  GASTROINTESTINAL: Abdomen is soft, non-tender, not distended w/ normal bowel sounds. No mass, ventral or inguinal hernia. No organomegally GENITOURINARY: Bladder non tender, not distended   MUSCULOSKELETAL: No abnormal joints or musculature NEUROLOGIC: Oriented X3. Cranial nerves 2-12 grossly intact. Moves all extremities no tremor. PSYCHIATRIC: Mood and affect appropriate to situation, no behavioral issues  Patient Active Problem List   Diagnosis Date Noted  . Diabetes mellitus with peripheral vascular disease 12/13/2012  . Arthritis   . UTI (urinary tract infection) 12/10/2012  . Hypernatremia 12/09/2012  . Acute respiratory failure 08/16/2012  . Acute on chronic diastolic CHF (congestive heart failure) 08/16/2012  . Protein-calorie malnutrition, severe 08/13/2012  . Hypophosphatemia 05/01/2012  . Type 2 diabetes mellitus with hyperosmolar nonketotic hyperglycemia 05/01/2012  . Fall  04/30/2012  . Thrombocytopenia 03/07/2012  . Severe malnutrition 03/07/2012  . Acute encephalopathy 03/04/2012  . Constipation 11/12/2011  . Gastroparesis 11/12/2011  . Nausea and vomiting 11/11/2011  . ARF (acute renal failure) 11/11/2011  . Diabetic hyperosmolar non-ketotic state 10/15/2011  . CVA (cerebral infarction) 10/15/2011  . [employer] 10/11/2011  . Diverticulosis of colon with hemorrhage 07/30/2011  . History of CVA (cerebrovascular accident) with associated mild right upper extremity hemiplegia 07/30/2011  . Cardiac pacemaker for history of intermittent heart block 07/30/2011  . Lower GI bleed 07/29/2011  . Acute blood loss anemia 07/29/2011  . Second-degree heart block 06/28/2011  . Hypoglycemia 06/24/2011  . Hypomagnesemia 05/30/2011  . Weakness generalized 05/29/2011  . Claudication in peripheral vascular disease status post stent to common iliac artery and 2011 and right subclavian artery stent 2010 04/05/2011  . Retrovascular hypertension status post left renal artery stent 2009 and balloon angioplasty for in-stent restenosis 2011 04/05/2011  . HTN (hypertension), malignant 03/28/2011  . Hypokalemia 03/28/2011  . Tobacco abuse 03/28/2011  . DM  (diabetes mellitus), type 2, uncontrolled, periph vascular complic 03/28/2011  . Peripheral neuropathy 03/28/2011    CBC    Component Value Date/Time   WBC 4.4 12/10/2012 1440   RBC 3.55* 12/10/2012 1440   HGB 11.8* 12/10/2012 1440   HCT 34.3* 12/10/2012 1440   PLT 193 12/10/2012 1440   MCV 96.6 12/10/2012 1440   LYMPHSABS 2.0 11/30/2012 1509   MONOABS 0.2 11/30/2012 1509   EOSABS 0.2 11/30/2012 1509   BASOSABS 0.0 11/30/2012 1509    CMP     Component Value Date/Time   NA 136 12/12/2012 0540   K 4.6 12/12/2012 0540   CL 104 12/12/2012 0540   CO2 23 12/12/2012 0540   GLUCOSE 142* 12/12/2012 0540   BUN 22 12/12/2012 0540   CREATININE 1.23* 12/12/2012 0540   CALCIUM 8.2* 12/12/2012 0540   PROT 6.1 12/06/2012 0420   ALBUMIN 2.3* 12/06/2012 0420   AST 32 12/06/2012 0420   ALT 33 12/06/2012 0420   ALKPHOS 93 12/06/2012 0420   BILITOT 0.3 12/06/2012 0420   GFRNONAA 41* 12/12/2012 0540   GFRAA 48* 12/12/2012 0540    Assessment and Plan  Acute on chronic diastolic CHF (congestive heart failure) Leading to acute respiratory failure requiring intubation;pt was agrresively diuresed and is stable on d/c; will continue her multiple meds  HTN (hypertension), malignant According to family anything below SBP 200 is good;hospital note confirmed this;pt's meds were tweeked while she was in hospital and we will continue these meds.  Diabetes mellitus with peripheral vascular disease Pt was initially hypoglycemic so insulin dose was reduced; last HbA1c was 7 months ago was 14;will watch CBGs and keep same dose pt was on in hospital  CVA (cerebral infarction) Stable- mild RUE paresis  Second-degree heart block Pt has pacemaker in place  UTI (urinary tract infection) May have been one of the causes of pt's hypoglycemia-pt is on Cipro 250 mg BID until 10/12    Margit Hanks, MD

## 2012-12-13 NOTE — Assessment & Plan Note (Signed)
Pt was initially hypoglycemic so insulin dose was reduced; last HbA1c was 7 months ago was 14;will watch CBGs and keep same dose pt was on in hospital

## 2012-12-13 NOTE — Assessment & Plan Note (Signed)
Stable- mild RUE paresis

## 2012-12-13 NOTE — Assessment & Plan Note (Signed)
Leading to acute respiratory failure requiring intubation;pt was agrresively diuresed and is stable on d/c; will continue her multiple meds

## 2012-12-19 ENCOUNTER — Non-Acute Institutional Stay (SKILLED_NURSING_FACILITY): Payer: Medicare Other | Admitting: Nurse Practitioner

## 2012-12-19 ENCOUNTER — Encounter: Payer: Self-pay | Admitting: Nurse Practitioner

## 2012-12-19 DIAGNOSIS — M549 Dorsalgia, unspecified: Secondary | ICD-10-CM

## 2012-12-19 DIAGNOSIS — I10 Essential (primary) hypertension: Secondary | ICD-10-CM

## 2012-12-19 NOTE — Progress Notes (Signed)
Patient ID: Carmen [name], female   DOB: [birth year], 79 y.o.   MRN: [id number]   PCP: Gwynneth Aliment, MD    Allergies  Allergen Reactions  . Norvasc [Amlodipine Besylate] Swelling  . Lasix [Furosemide]     Caused a problem with her kidneys. However, pt is taking daily at home.  . Peanut-Containing Drug Products Other (See Comments)    Cause my stomach to hurt    Chief Complaint  Patient presents with  . Acute Visit    HPI:  79 year old female being seen today for acute visit per nursing request due to back pain. Pt reports she has chronic back pain and thought she may want this further evaluated; however on today's visit she reports pain is unchanged; rates this anywhere from a 2-9 depending on what she is doing. Pt currently in room and has been working with therapy. This does not aggravate back pain and she reports it is not bothering her now. Therapist reviews notes and there is no complaints of back pain with any therapy. Pt reports tylenol with codeine gives good relief; no sudden loss of bowel or bladder, no numbness or tingling.  pts blood pressures have been elevated on review however today she has a blood pressure of 86/59; pt reports blood pressures normally run high (they have been as high as 200s/90s) and is asymptomatic; pt denies any headache, blurred visit, weakness, no N/V, no chest pain or shortness of breath; pt with freq blood pressure above 200 which is not new per notes  Review of Systems:  Review of Systems  Constitutional: Negative for fever, chills, malaise/fatigue and diaphoresis.  HENT: Negative for tinnitus.   Eyes: Negative for blurred vision and double vision.  Respiratory: Negative for shortness of breath.   Cardiovascular: Negative for chest pain.  Gastrointestinal: Negative for nausea, vomiting and abdominal pain.  Musculoskeletal: Positive for back pain. Negative for myalgias.  Neurological: Negative for dizziness, tingling, sensory change, speech  change, focal weakness, loss of consciousness, weakness and headaches.     Past Medical History  Diagnosis Date  . Diabetes mellitus   . Hypertension   . Coronary artery disease   . Renal disorder   . Herniated lumbar intervertebral disc   . Cataract     cataract surgery scheduled for 03/31/11, 2 - left eye, 1 - right eye  . Renovascular hypertension      s/p left renal artery stent 12/2007.  S/P balloon angioplasty on 02/16/10 for ISR, BP was  controlled well since then.     . Claudication in peripheral vascular disease     02/16/10: Left CIA 9.0x28 Omnilink and REIA 8.0x40 seff expanding Zilver.   right subclavian artery stent 03/18/2008,  . S/P carotid endarterectomy      left carotid endarterectomy  1991; Stroke and TIA in 1999 with right sided weakness, now with residual right arm weakness  . Stroke     TIA  . Peripheral vascular disease   . Anxiety   . Arthritis    Past Surgical History  Procedure Laterality Date  . Appendectomy    . Abdominal hysterectomy    . Ureteral stent placement    . Carotid endarterectomy    . Laminectomy    . Back surgery    . Colonoscopy  07/30/2011    Procedure: COLONOSCOPY;  Surgeon: Louis Meckel, MD;  Location: [facility] ENDOSCOPY;  Service: Endoscopy;  Laterality: N/A;  gi bleed  . Esophagogastroduodenoscopy  07/30/2011  Procedure: ESOPHAGOGASTRODUODENOSCOPY (EGD);  Surgeon: Louis Meckel, MD;  Location[facility] ENDOSCOPY;  Service: Endoscopy;  Laterality: N/A;  . Pacemaker insertion     Social History:   reports that she has been smoking Cigarettes.  She has a 32.5 pack-year smoking history. She uses smokeless tobacco. She reports that she does not drink alcohol or use illicit drugs.  Family History  Problem Relation Age of Onset  . Cancer Father     Medications: Patient's Medications  New Prescriptions   No medications on file  Previous Medications   ACETAMINOPHEN-CODEINE (TYLENOL #3) 300-30 MG PER TABLET    Take 1/2 tablet by mouth  daily as needed   ASPIRIN EC 81 MG TABLET    Take 81 mg by mouth daily.   CLONIDINE (CATAPRES) 0.2 MG TABLET    Take 1 tablet (0.2 mg total) by mouth 2 (two) times daily.   DILTIAZEM (CARDIZEM SR) 120 MG 12 HR CAPSULE    Take 1 capsule (120 mg total) by mouth 2 (two) times daily.   INSULIN NPH-REGULAR (NOVOLIN 70/30) (70-30) 100 UNIT/ML INJECTION    Inject 10 Units into the skin 2 (two) times daily with a meal.   ISOSORBIDE MONONITRATE (IMDUR) 120 MG 24 HR TABLET    Take 1 tablet (120 mg total) by mouth daily.   LOSARTAN (COZAAR) 100 MG TABLET    Take 1 tablet (100 mg total) by mouth daily.   METOPROLOL (LOPRESSOR) 100 MG TABLET    Take 100 mg by mouth 2 (two) times daily.   OMEGA-3 ACID ETHYL ESTERS (LOVAZA) 1 G CAPSULE    Take 1 g by mouth 3 (three) times daily.    SPIRONOLACTONE (ALDACTONE) 25 MG TABLET    Take 1 tablet (25 mg total) by mouth daily.  Modified Medications   No medications on file  Discontinued Medications   No medications on file     Physical Exam:  Filed Vitals:   12/19/12 1244  BP: 158/65  Pulse: 60  Temp: 97.4 F (36.3 C)  Resp: 18   Physical Exam  Constitutional: She is well-developed, well-nourished, and in no distress. No distress.  HENT:  Head: Normocephalic and atraumatic.  Mouth/Throat: Oropharynx is clear and moist. No oropharyngeal exudate.  Eyes: Conjunctivae and EOM are normal. Pupils are equal, round, and reactive to light.  Neck: Normal range of motion. Neck supple.  Cardiovascular: Normal rate, regular rhythm and normal heart sounds.   Pulmonary/Chest: Effort normal and breath sounds normal.  Abdominal: Soft. Bowel sounds are normal.  Musculoskeletal: Normal range of motion. She exhibits no edema and no tenderness (no tenderness along spine or paraspinal muscles on exam).  Neurological: She is alert.  Skin: Skin is warm and dry. She is not diaphoretic.  Psychiatric: Affect normal.    Labs reviewed: Basic Metabolic Panel:  Recent Labs   12/04/12 0439 12/05/12 0400  12/07/12 0500 12/08/12 0648 12/09/12 2045 12/10/12 0500 12/12/12 0540  NA 154* 156*  < > 154* 148* 136 139 136  K 5.1 3.8  < > 3.2* 3.7 5.2* 4.6 4.6  CL 116* 120*  < > 118* 114* 104 106 104  CO2 23 30  < > 27 26 24 26 23   GLUCOSE 193* 148*  < > 83 44* 197* 219* 142*  BUN 41* 42*  < > 31* 27* 24* 23 22  CREATININE 1.36* 1.26*  < > 1.10 1.12* 1.14* 1.09 1.23*  CALCIUM 8.8 8.9  < > 8.5 8.4 8.1* 7.8* 8.2*  MG 2.2 2.1  --  1.9 1.9  --   --   --   PHOS 4.6 3.0  --  2.7  --   --   --   --   < > = values in this interval not displayed. Liver Function Tests:  Recent Labs  08/13/12 0500 11/30/12 1509 12/06/12 0420  AST 79* 55* 32  ALT 71* 72* 33  ALKPHOS 115 129* 93  BILITOT 0.2* 0.2* 0.3  PROT 5.8* 6.7 6.1  ALBUMIN 2.4* 2.8* 2.3*   No results found for this basename: LIPASE, AMYLASE,  in the last 8760 hours No results found for this basename: AMMONIA,  in the last 8760 hours CBC:  Recent Labs  04/30/12 1519  09/01/12 1333 11/30/12 1509  12/05/12 0400 12/06/12 0420 12/10/12 1440  WBC 3.2*  < > 5.1 5.3  < > 6.1 4.4 4.4  NEUTROABS 1.9  --  3.9 3.0  --   --   --   --   HGB 14.3  < > 14.6 14.9  < > 13.0 13.0 11.8*  HCT 40.8  < > 43.2 42.3  < > 38.4 39.6 34.3*  MCV 94.0  < > 98.9 96.8  < > 100.8* 101.5* 96.6  PLT 95*  < > 153 178  < > 150 158 193  < > = values in this interval not displayed. Cardiac Enzymes:  Recent Labs  03/04/12 0905 03/06/12 0420  11/30/12 1503 11/30/12 1818 11/30/12 2229  CKTOTAL 147 130  --   --   --   --   CKMB 5.0*  --   --   --   --   --   TROPONINI <0.30  --   < > <0.30 <0.30 <0.30  < > = values in this interval not displayed. BNP: No components found with this basename: POCBNP,  CBG:  Recent Labs  12/11/12 2105 12/12/12 0646 12/12/12 1125  GLUCAP 177* 125* 198*     Assessment/Plan 1. HTN (hypertension), malignant pts blood pressure frequently high; meds adjusted in hospital; today had a reading of  86/59; pt asymptomatic; will cont to have staff monitor VS q 8 hours  2. Back pain Pt does not want any further workup at this time; doing well with therapy and pain well controlled with PRN medications at this time. No pain noted on exam today

## 2013-01-01 ENCOUNTER — Non-Acute Institutional Stay (SKILLED_NURSING_FACILITY): Payer: Medicare Other | Admitting: Internal Medicine

## 2013-01-01 DIAGNOSIS — I798 Other disorders of arteries, arterioles and capillaries in diseases classified elsewhere: Secondary | ICD-10-CM

## 2013-01-01 DIAGNOSIS — E1159 Type 2 diabetes mellitus with other circulatory complications: Secondary | ICD-10-CM

## 2013-01-01 DIAGNOSIS — Z7189 Other specified counseling: Secondary | ICD-10-CM

## 2013-01-01 DIAGNOSIS — E1151 Type 2 diabetes mellitus with diabetic peripheral angiopathy without gangrene: Secondary | ICD-10-CM

## 2013-01-01 NOTE — Assessment & Plan Note (Signed)
Pt's BS appears to be around 150-200 then she suddenly drops; after review of CBG will d/c

## 2013-01-01 NOTE — Assessment & Plan Note (Addendum)
BS in general running around 200's on current regimen. Discussed with pt and daughter-will keep 70/30 10 units q am and 6 units of NPH only at night . This is based on the fact that pt has gotten up for years to eat a snack at midnight and low BS occurs most consistently at night and usually around midnight. Pt is obviously a brittle diabetic and BS will probably need to be loosely controlled to avoid hypoglycemic episodes

## 2013-01-01 NOTE — Progress Notes (Signed)
MRN: [id number] Name: Carmen [name]  Sex: female Age: 79 y.o. DOB: 04-24-1933  PSC #: Sonny Dandy Facility/Room:  119 Level Of Care: SNF Provider: Merrilee Seashore D Emergency Contacts: Extended Emergency Contact Information Primary Emergency Contact: Simmons,Trina Address: [address]          [place], [place] [address] [place] Home Phone: [phone] Mobile Phone: [phone] Relation: Grandaughter Secondary Emergency Contact: Kohen,James Address: [address]          [place], [place] [address] [place] Home Phone: [phone] Relation: Spouse  Code Status: FULL  Allergies: Norvasc; Lasix; and Peanut-containing drug products  Chief Complaint  Patient presents with  . family conferance  . Acute Visit    HPI: Patient is 79 y.o. female whose having low BS. Also family would like to speak to me.   Past Medical History  Diagnosis Date  . Diabetes mellitus   . Hypertension   . Coronary artery disease   . Renal disorder   . Herniated lumbar intervertebral disc   . Cataract     cataract surgery scheduled for 03/31/11, 2 - left eye, 1 - right eye  . Renovascular hypertension      s/p left renal artery stent 12/2007.  S/P balloon angioplasty on 02/16/10 for ISR, BP was  controlled well since then.     . Claudication in peripheral vascular disease     02/16/10: Left CIA 9.0x28 Omnilink and REIA 8.0x40 seff expanding Zilver.   right subclavian artery stent 03/18/2008,  . S/P carotid endarterectomy      left carotid endarterectomy  1991; Stroke and TIA in 1999 with right sided weakness, now with residual right arm weakness  . Stroke     TIA  . Peripheral vascular disease   . Anxiety   . Arthritis     Past Surgical History  Procedure Laterality Date  . Appendectomy    . Abdominal hysterectomy    . Ureteral stent placement    . Carotid endarterectomy    . Laminectomy    . Back surgery    . Colonoscopy  07/30/2011    Procedure:  COLONOSCOPY;  Surgeon: Louis Meckel, MD;  Location: [facility] ENDOSCOPY;  Service: Endoscopy;  Laterality: N/A;  gi bleed  . Esophagogastroduodenoscopy  07/30/2011    Procedure: ESOPHAGOGASTRODUODENOSCOPY (EGD);  Surgeon: Louis Meckel, MD;  Location: [facility] ENDOSCOPY;  Service: Endoscopy;  Laterality: N/A;  . Pacemaker insertion        Medication List       This list is accurate as of: 01/01/13 11:59 PM.  Always use your most recent med list.               acetaminophen-codeine 300-30 MG per tablet  Commonly known as:  TYLENOL #3  Take 1/2 tablet by mouth daily as needed     aspirin EC 81 MG tablet  Take 81 mg by mouth daily.     cloNIDine 0.2 MG tablet  Commonly known as:  CATAPRES  Take 1 tablet (0.2 mg total) by mouth 2 (two) times daily.     diltiazem 120 MG 12 hr capsule  Commonly known as:  CARDIZEM SR  Take 1 capsule (120 mg total) by mouth 2 (two) times daily.     insulin NPH-regular (70-30) 100 UNIT/ML injection  Commonly known as:  NOVOLIN 70/30  Inject 10 Units into the skin 2 (two) times daily with a meal.     isosorbide mononitrate 120 MG 24 hr  tablet  Commonly known as:  IMDUR  Take 1 tablet (120 mg total) by mouth daily.     losartan 100 MG tablet  Commonly known as:  COZAAR  Take 1 tablet (100 mg total) by mouth daily.     metoprolol 100 MG tablet  Commonly known as:  LOPRESSOR  Take 100 mg by mouth 2 (two) times daily.     omega-3 acid ethyl esters 1 G capsule  Commonly known as:  LOVAZA  Take 1 g by mouth 3 (three) times daily.     spironolactone 25 MG tablet  Commonly known as:  ALDACTONE  Take 1 tablet (25 mg total) by mouth daily.        No orders of the defined types were placed in this encounter.    Immunization History  Administered Date(s) Administered  . Tdap 04/30/2012    History  Substance Use Topics  . Smoking status: Current Every Day Smoker -- 0.50 packs/day for 65 years    Types: Cigarettes  . Smokeless tobacco: Current  User  . Alcohol Use: No    Review of Systems  DATA OBTAINED: from patient, nurse and daughter   PT WOULD LIKE TO GO HOME BUT IS WILLING TO STAY LONGER IF WE CAN GET PM SUGAR UNDER CONTROL;PT ADMITS SHE HAS BEEN ON ALL SORTS OF REGIMENS IN THE PAST; IN PARTICULAR LEVEMIR DOESN'T WORK; NOTHING HAS EVER REALLY WORKED AND PT HAS BEEN GETTING UP FOR YEARS TO EAT AN 11P-12 MN SNACK GENERAL: Feels well no fevers, fatigue, appetite changes SKIN: No itching, rash HEENT: No complaint RESPIRATORY: No cough, wheezing, SOB CARDIAC: No chest pain, palpitations, lower extremity edema  GI: No abdominal pain, No N/V/D or constipation, No heartburn or reflux  GU: No dysuria, frequency or urgency, or incontinence  MUSCULOSKELETAL: No unrelieved bone/joint pain NEUROLOGIC: No headache, dizziness or focal weakness PSYCHIATRIC: No overt anxiety or sadness. Sleeps well.   There were no vitals filed for this visit.  Physical Exam  GENERAL APPEARANCE: Alert, conversant. Appropriately groomed. No acute distress  SKIN: No diaphoresis rash, or wounds HEENT: Unremarkable RESPIRATORY: Breathing is even, unlabored. Lung sounds are clear   CARDIOVASCULAR: Heart RRR no murmurs, rubs or gallops. No peripheral edema  GASTROINTESTINAL: Abdomen is soft, non-tender, not distended w/ normal bowel sounds.  GENITOURINARY: Bladder non tender, not distended  MUSCULOSKELETAL: No abnormal joints or musculature NEUROLOGIC: Cranial nerves 2-12 grossly intact. Moves all extremities no tremor. PSYCHIATRIC: Mood and affect appropriate to situation, no behavioral issues  Patient Active Problem List   Diagnosis Date Noted  . Diabetes mellitus with peripheral vascular disease 12/13/2012  . Arthritis   . UTI (urinary tract infection) 12/10/2012  . Hypernatremia 12/09/2012  . Acute respiratory failure 08/16/2012  . Acute on chronic diastolic CHF (congestive heart failure) 08/16/2012  . Protein-calorie malnutrition, severe  08/13/2012  . Hypophosphatemia 05/01/2012  . Type 2 diabetes mellitus with hyperosmolar nonketotic hyperglycemia 05/01/2012  . Fall 04/30/2012  . Thrombocytopenia 03/07/2012  . Severe malnutrition 03/07/2012  . Acute encephalopathy 03/04/2012  . Constipation 11/12/2011  . Gastroparesis 11/12/2011  . Nausea and vomiting 11/11/2011  . ARF (acute renal failure) 11/11/2011  . Diabetic hyperosmolar non-ketotic state 10/15/2011  . CVA (cerebral infarction) 10/15/2011  . [employer] 10/11/2011  . Diverticulosis of colon with hemorrhage 07/30/2011  . History of CVA (cerebrovascular accident) with associated mild right upper extremity hemiplegia 07/30/2011  . Cardiac pacemaker for history of intermittent heart block 07/30/2011  . Lower GI bleed 07/29/2011  .  Acute blood loss anemia 07/29/2011  . Second-degree heart block 06/28/2011  . Hypoglycemia 06/24/2011  . Hypomagnesemia 05/30/2011  . Weakness generalized 05/29/2011  . Claudication in peripheral vascular disease status post stent to common iliac artery and 2011 and right subclavian artery stent 2010 04/05/2011  . Retrovascular hypertension status post left renal artery stent 2009 and balloon angioplasty for in-stent restenosis 2011 04/05/2011  . HTN (hypertension), malignant 03/28/2011  . Hypokalemia 03/28/2011  . Tobacco abuse 03/28/2011  . DM (diabetes mellitus), type 2, uncontrolled, periph vascular complic 03/28/2011  . Peripheral neuropathy 03/28/2011    CBC    Component Value Date/Time   WBC 4.4 12/10/2012 1440   RBC 3.55* 12/10/2012 1440   HGB 11.8* 12/10/2012 1440   HCT 34.3* 12/10/2012 1440   PLT 193 12/10/2012 1440   MCV 96.6 12/10/2012 1440   LYMPHSABS 2.0 11/30/2012 1509   MONOABS 0.2 11/30/2012 1509   EOSABS 0.2 11/30/2012 1509   BASOSABS 0.0 11/30/2012 1509    CMP     Component Value Date/Time   NA 136 12/12/2012 0540   K 4.6 12/12/2012 0540   CL 104 12/12/2012 0540   CO2 23 12/12/2012 0540   GLUCOSE  142* 12/12/2012 0540   BUN 22 12/12/2012 0540   CREATININE 1.23* 12/12/2012 0540   CALCIUM 8.2* 12/12/2012 0540   PROT 6.1 12/06/2012 0420   ALBUMIN 2.3* 12/06/2012 0420   AST 32 12/06/2012 0420   ALT 33 12/06/2012 0420   ALKPHOS 93 12/06/2012 0420   BILITOT 0.3 12/06/2012 0420   GFRNONAA 41* 12/12/2012 0540   GFRAA 48* 12/12/2012 0540    Assessment and Plan  Diabetes mellitus with peripheral vascular disease Pt's BS appears to be around 150-200 then she suddenly drops; after review of CBG will d/c   DM (diabetes mellitus), type 2, uncontrolled, periph vascular complic BS in general running around 200's on current regimen. Discussed with pt and daughter-will keep 70/30 10 units q am and 6 units of NPH only at night . This is based on the fact that pt has gotten up for years to eat a snack at midnight and low BS occurs most consistently at night and usually around midnight. Pt is obviously a brittle diabetic and BS will probably need to be loosely controlled to avoid hypoglycemic episodes    Margit Hanks, MD

## 2013-01-02 ENCOUNTER — Non-Acute Institutional Stay (SKILLED_NURSING_FACILITY): Payer: Medicare Other | Admitting: Nurse Practitioner

## 2013-01-02 ENCOUNTER — Encounter: Payer: Self-pay | Admitting: Internal Medicine

## 2013-01-02 DIAGNOSIS — I10 Essential (primary) hypertension: Secondary | ICD-10-CM

## 2013-01-02 DIAGNOSIS — I5033 Acute on chronic diastolic (congestive) heart failure: Secondary | ICD-10-CM

## 2013-01-02 DIAGNOSIS — G609 Hereditary and idiopathic neuropathy, unspecified: Secondary | ICD-10-CM

## 2013-01-02 DIAGNOSIS — R5381 Other malaise: Secondary | ICD-10-CM

## 2013-01-02 DIAGNOSIS — G629 Polyneuropathy, unspecified: Secondary | ICD-10-CM

## 2013-01-02 DIAGNOSIS — R531 Weakness: Secondary | ICD-10-CM

## 2013-01-02 DIAGNOSIS — J9600 Acute respiratory failure, unspecified whether with hypoxia or hypercapnia: Secondary | ICD-10-CM

## 2013-01-02 DIAGNOSIS — I798 Other disorders of arteries, arterioles and capillaries in diseases classified elsewhere: Secondary | ICD-10-CM

## 2013-01-02 DIAGNOSIS — I509 Heart failure, unspecified: Secondary | ICD-10-CM

## 2013-01-02 DIAGNOSIS — I639 Cerebral infarction, unspecified: Secondary | ICD-10-CM

## 2013-01-02 DIAGNOSIS — N390 Urinary tract infection, site not specified: Secondary | ICD-10-CM

## 2013-01-02 DIAGNOSIS — E1151 Type 2 diabetes mellitus with diabetic peripheral angiopathy without gangrene: Secondary | ICD-10-CM

## 2013-01-02 DIAGNOSIS — E1159 Type 2 diabetes mellitus with other circulatory complications: Secondary | ICD-10-CM

## 2013-01-02 DIAGNOSIS — I6350 Cerebral infarction due to unspecified occlusion or stenosis of unspecified cerebral artery: Secondary | ICD-10-CM

## 2013-01-02 NOTE — Progress Notes (Signed)
Patient ID: Carmen [name], female   DOB: Jun 18, 1933, 79 y.o.   MRN: [id number] Nursing Home Location:  [facility]   Place of Service: SNF (31)  PCP: Gwynneth Aliment, MD   Allergies  Allergen Reactions  . Norvasc [Amlodipine Besylate] Swelling  . Lasix [Furosemide]     Caused a problem with her kidneys. However, pt is taking daily at home.  . Peanut-Containing Drug Products Other (See Comments)    Cause my stomach to hurt    Chief Complaint  Patient presents with  . Discharge Note    HPI:  Carmen [name] is being seen today for discharge home from [facility]. Pt is a 79 year old African American female past medical history of chronic diastolic heart failure, poorly controlled hypertension diabetes mellitus after a fall and secondary drowsiness. EMS noted CBG to be 44 and patient was given D50. In the emergency room, patient was noted to have blood pressure 217/107, respiratory rate 30, potassium 2.7 and BNP of almost 11,000 with chest x-ray consistent with CHF and patient was intubated and transferred to ICU on ventilator. Patient spent approximately the next 5-6 days on ventilator and was able to be extubated. Cardiology was consulted and patient was aggressively diuresed. Pt was transferred to [facility] for rehab services.  Patient currently doing well with therapy, now stable to discharge home with home health. Ongoing issues HTN (hypertension), malignant: Patient has always had a difficult time controlling her blood pressure. Currently on cozaar, metoprolol, spironolactone, Cardizem, clondine and IMDUR; pt reports if blood pressure below 190 she is doing goo;   DM (diabetes mellitus), type 2, uncontrolled, pt very labile; MD saw pt yesterday and decrease novolin 70/30 to once daily and to get NPH 6 units in the pm Peripheral neuropathy: stable History of CVA (cerebrovascular accident) with associated mild right upper extremity hemiplegia: Stable.  Cardiac pacemaker for  history of intermittent heart block  Protein-calorie malnutrition, severe: Patient was evaluated by nutrition who recommended Ensure shakes 3 times a day  Acute on chronic diastolic CHF (congestive heart failure): stable no signs of fluid retention, no shortness of breath, sats remain stable.  Back pain: chronic and stable on current as needed medication    Review of Systems:  Review of Systems  Constitutional: Negative for fever, chills, malaise/fatigue and diaphoresis.  HENT: Negative for tinnitus.   Eyes: Negative for blurred vision and double vision.  Respiratory: Negative for shortness of breath.   Cardiovascular: Negative for chest pain and leg swelling.  Gastrointestinal: Negative for nausea, vomiting and abdominal pain.  Musculoskeletal: Positive for back pain. Negative for myalgias.  Neurological: Negative for dizziness, tingling, sensory change, speech change, focal weakness, loss of consciousness, weakness and headaches.     Past Medical History  Diagnosis Date  . Diabetes mellitus   . Hypertension   . Coronary artery disease   . Renal disorder   . Herniated lumbar intervertebral disc   . Cataract     cataract surgery scheduled for 03/31/11, 2 - left eye, 1 - right eye  . Renovascular hypertension      s/p left renal artery stent 12/2007.  S/P balloon angioplasty on 02/16/10 for ISR, BP was  controlled well since then.     . Claudication in peripheral vascular disease     02/16/10: Left CIA 9.0x28 Omnilink and REIA 8.0x40 seff expanding Zilver.   right subclavian artery stent 03/18/2008,  . S/P carotid endarterectomy      left carotid endarterectomy  1991; Stroke  and TIA in 1999 with right sided weakness, now with residual right arm weakness  . Stroke     TIA  . Peripheral vascular disease   . Anxiety   . Arthritis    Past Surgical History  Procedure Laterality Date  . Appendectomy    . Abdominal hysterectomy    . Ureteral stent placement    . Carotid  endarterectomy    . Laminectomy    . Back surgery    . Colonoscopy  07/30/2011    Procedure: COLONOSCOPY;  Surgeon: Louis Meckel, MD;  Location: [facility] ENDOSCOPY;  Service: Endoscopy;  Laterality: N/A;  gi bleed  . Esophagogastroduodenoscopy  07/30/2011    Procedure: ESOPHAGOGASTRODUODENOSCOPY (EGD);  Surgeon: Louis Meckel, MD;  Location: [facility] ENDOSCOPY;  Service: Endoscopy;  Laterality: N/A;  . Pacemaker insertion     Social History:   reports that she has been smoking Cigarettes.  She has a 32.5 pack-year smoking history. She uses smokeless tobacco. She reports that she does not drink alcohol or use illicit drugs.  Family History  Problem Relation Age of Onset  . Cancer Father     Medications: Patient's Medications  New Prescriptions   No medications on file  Previous Medications   ACETAMINOPHEN-CODEINE (TYLENOL #3) 300-30 MG PER TABLET    Take 1/2 tablet by mouth daily as needed   ASPIRIN EC 81 MG TABLET    Take 81 mg by mouth daily.   CLONIDINE (CATAPRES) 0.2 MG TABLET    Take 1 tablet (0.2 mg total) by mouth 2 (two) times daily.   DILTIAZEM (CARDIZEM SR) 120 MG 12 HR CAPSULE    Take 1 capsule (120 mg total) by mouth 2 (two) times daily.   INSULIN NPH (HUMULIN N,NOVOLIN N) 100 UNIT/ML INJECTION    Inject 6 Units into the skin at bedtime.   ISOSORBIDE MONONITRATE (IMDUR) 120 MG 24 HR TABLET    Take 1 tablet (120 mg total) by mouth daily.   LOSARTAN (COZAAR) 100 MG TABLET    Take 1 tablet (100 mg total) by mouth daily.   METOPROLOL (LOPRESSOR) 100 MG TABLET    Take 100 mg by mouth 2 (two) times daily.   OMEGA-3 ACID ETHYL ESTERS (LOVAZA) 1 G CAPSULE    Take 1 g by mouth 3 (three) times daily.    SPIRONOLACTONE (ALDACTONE) 25 MG TABLET    Take 1 tablet (25 mg total) by mouth daily.  Modified Medications   Modified Medication Previous Medication   INSULIN NPH-REGULAR (NOVOLIN 70/30) (70-30) 100 UNIT/ML INJECTION insulin NPH-regular (NOVOLIN 70/30) (70-30) 100 UNIT/ML injection       Inject 10 Units into the skin daily with breakfast.    Inject 10 Units into the skin 2 (two) times daily with a meal.  Discontinued Medications   No medications on file     Physical Exam:  Filed Vitals:   01/02/13 1225  BP: 142/78  Pulse: 60  Temp: 98.5 F (36.9 C)  Resp: 20  SpO2: 97%   Physical Exam  Constitutional: She is well-developed, well-nourished, and in no distress. No distress.  HENT:  Head: Normocephalic and atraumatic.  Mouth/Throat: Oropharynx is clear and moist. No oropharyngeal exudate.  Eyes: Conjunctivae and EOM are normal. Pupils are equal, round, and reactive to light.  Neck: Normal range of motion. Neck supple. No thyromegaly present.  Cardiovascular: Normal rate, regular rhythm and normal heart sounds.   Pulmonary/Chest: Effort normal and breath sounds normal. No respiratory distress. She has no  wheezes.  Abdominal: Soft. Bowel sounds are normal. She exhibits no distension. There is no tenderness.  Musculoskeletal: Normal range of motion. She exhibits no edema and no tenderness.  Neurological: She is alert.  Skin: Skin is warm and dry. She is not diaphoretic.      Labs reviewed: Basic Metabolic Panel:  Recent Labs  86/57/84 0439 12/05/12 0400  12/07/12 0500 12/08/12 0648 12/09/12 2045 12/10/12 0500 12/12/12 0540  NA 154* 156*  < > 154* 148* 136 139 136  K 5.1 3.8  < > 3.2* 3.7 5.2* 4.6 4.6  CL 116* 120*  < > 118* 114* 104 106 104  CO2 23 30  < > 27 26 24 26 23   GLUCOSE 193* 148*  < > 83 44* 197* 219* 142*  BUN 41* 42*  < > 31* 27* 24* 23 22  CREATININE 1.36* 1.26*  < > 1.10 1.12* 1.14* 1.09 1.23*  CALCIUM 8.8 8.9  < > 8.5 8.4 8.1* 7.8* 8.2*  MG 2.2 2.1  --  1.9 1.9  --   --   --   PHOS 4.6 3.0  --  2.7  --   --   --   --   < > = values in this interval not displayed. Liver Function Tests:  Recent Labs  08/13/12 0500 11/30/12 1509 12/06/12 0420  AST 79* 55* 32  ALT 71* 72* 33  ALKPHOS 115 129* 93  BILITOT 0.2* 0.2* 0.3  PROT  5.8* 6.7 6.1  ALBUMIN 2.4* 2.8* 2.3*   No results found for this basename: LIPASE, AMYLASE,  in the last 8760 hours No results found for this basename: AMMONIA,  in the last 8760 hours CBC:  Recent Labs  04/30/12 1519  09/01/12 1333 11/30/12 1509  12/05/12 0400 12/06/12 0420 12/10/12 1440  WBC 3.2*  < > 5.1 5.3  < > 6.1 4.4 4.4  NEUTROABS 1.9  --  3.9 3.0  --   --   --   --   HGB 14.3  < > 14.6 14.9  < > 13.0 13.0 11.8*  HCT 40.8  < > 43.2 42.3  < > 38.4 39.6 34.3*  MCV 94.0  < > 98.9 96.8  < > 100.8* 101.5* 96.6  PLT 95*  < > 153 178  < > 150 158 193  < > = values in this interval not displayed. Cardiac Enzymes:  Recent Labs  03/04/12 0905 03/06/12 0420  11/30/12 1503 11/30/12 1818 11/30/12 2229  CKTOTAL 147 130  --   --   --   --   CKMB 5.0*  --   --   --   --   --   TROPONINI <0.30  --   < > <0.30 <0.30 <0.30  < > = values in this interval not displayed. BNP: No components found with this basename: POCBNP,  CBG:  Recent Labs  12/11/12 2105 12/12/12 0646 12/12/12 1125  GLUCAP 177* 125* 198*   TSH:  Recent Labs  03/05/12 0153  TSH 1.055   A1C: Lab Results  Component Value Date   HGBA1C 14.4* 05/29/2012     Assessment/Plan 1. HTN (hypertension), malignant Stable; to cont current medications and follow up as outpt  2. DM (diabetes mellitus), type 2, uncontrolled, periph vascular complic With recent lows medication was adjusted; pt is frail diabetic; will cont NPH and novolin 70/30 on discharge needs to follow up with PCP  3. Acute on chronic diastolic CHF (congestive heart failure) Resolved; no  signs of worsening CHF at this time. conts aldactone   4. Peripheral neuropathy Stable   5. CVA (cerebral infarction) Right sided weakness; no worseningof symptoms  6. Weakness generalized Improved since being at [facility]. pt is stable for discharge-will need PT/OT/Nursing per home health. No DME needed. Rx written.  will need to follow up with PCP  within 2 weeks.   7. Acute respiratory failure sresolved  8. UTI (urinary tract infection) Resolved

## 2013-01-22 ENCOUNTER — Other Ambulatory Visit: Payer: Self-pay | Admitting: Internal Medicine

## 2013-01-22 DIAGNOSIS — R413 Other amnesia: Secondary | ICD-10-CM

## 2013-02-16 ENCOUNTER — Other Ambulatory Visit: Payer: Self-pay | Admitting: Nurse Practitioner

## 2013-04-05 ENCOUNTER — Inpatient Hospital Stay (HOSPITAL_COMMUNITY)
Admission: EM | Admit: 2013-04-05 | Discharge: 2013-04-08 | DRG: 638 | Disposition: A | Discharge status: Home Health | Payer: Medicare Other | Attending: Internal Medicine | Admitting: Internal Medicine

## 2013-04-05 ENCOUNTER — Encounter (HOSPITAL_COMMUNITY): Payer: Self-pay | Admitting: Emergency Medicine

## 2013-04-05 DIAGNOSIS — N179 Acute kidney failure, unspecified: Secondary | ICD-10-CM

## 2013-04-05 DIAGNOSIS — E1100 Type 2 diabetes mellitus with hyperosmolarity without nonketotic hyperglycemic-hyperosmolar coma (NKHHC): Principal | ICD-10-CM

## 2013-04-05 DIAGNOSIS — I129 Hypertensive chronic kidney disease with stage 1 through stage 4 chronic kidney disease, or unspecified chronic kidney disease: Secondary | ICD-10-CM | POA: Diagnosis present

## 2013-04-05 DIAGNOSIS — R945 Abnormal results of liver function studies: Secondary | ICD-10-CM | POA: Diagnosis present

## 2013-04-05 DIAGNOSIS — E119 Type 2 diabetes mellitus without complications: Secondary | ICD-10-CM | POA: Diagnosis present

## 2013-04-05 DIAGNOSIS — N183 Chronic kidney disease, stage 3 unspecified: Secondary | ICD-10-CM | POA: Diagnosis present

## 2013-04-05 DIAGNOSIS — Z8673 Personal history of transient ischemic attack (TIA), and cerebral infarction without residual deficits: Secondary | ICD-10-CM

## 2013-04-05 DIAGNOSIS — Z79899 Other long term (current) drug therapy: Secondary | ICD-10-CM

## 2013-04-05 DIAGNOSIS — G629 Polyneuropathy, unspecified: Secondary | ICD-10-CM | POA: Diagnosis present

## 2013-04-05 DIAGNOSIS — R740 Nonspecific elevation of levels of transaminase and lactic acid dehydrogenase [LDH]: Secondary | ICD-10-CM

## 2013-04-05 DIAGNOSIS — Z7982 Long term (current) use of aspirin: Secondary | ICD-10-CM

## 2013-04-05 DIAGNOSIS — E1159 Type 2 diabetes mellitus with other circulatory complications: Secondary | ICD-10-CM | POA: Diagnosis present

## 2013-04-05 DIAGNOSIS — G609 Hereditary and idiopathic neuropathy, unspecified: Secondary | ICD-10-CM | POA: Diagnosis present

## 2013-04-05 DIAGNOSIS — R739 Hyperglycemia, unspecified: Secondary | ICD-10-CM | POA: Diagnosis present

## 2013-04-05 DIAGNOSIS — Z794 Long term (current) use of insulin: Secondary | ICD-10-CM

## 2013-04-05 DIAGNOSIS — R7989 Other specified abnormal findings of blood chemistry: Secondary | ICD-10-CM | POA: Diagnosis present

## 2013-04-05 DIAGNOSIS — R7309 Other abnormal glucose: Secondary | ICD-10-CM

## 2013-04-05 DIAGNOSIS — N189 Chronic kidney disease, unspecified: Secondary | ICD-10-CM

## 2013-04-05 DIAGNOSIS — E876 Hypokalemia: Secondary | ICD-10-CM

## 2013-04-05 DIAGNOSIS — I2510 Atherosclerotic heart disease of native coronary artery without angina pectoris: Secondary | ICD-10-CM | POA: Diagnosis present

## 2013-04-05 DIAGNOSIS — E1110 Type 2 diabetes mellitus with ketoacidosis without coma: Secondary | ICD-10-CM

## 2013-04-05 DIAGNOSIS — E860 Dehydration: Secondary | ICD-10-CM | POA: Diagnosis present

## 2013-04-05 DIAGNOSIS — R7401 Elevation of levels of liver transaminase levels: Secondary | ICD-10-CM

## 2013-04-05 DIAGNOSIS — I798 Other disorders of arteries, arterioles and capillaries in diseases classified elsewhere: Secondary | ICD-10-CM | POA: Diagnosis present

## 2013-04-05 DIAGNOSIS — Z950 Presence of cardiac pacemaker: Secondary | ICD-10-CM

## 2013-04-05 DIAGNOSIS — R531 Weakness: Secondary | ICD-10-CM | POA: Diagnosis present

## 2013-04-05 DIAGNOSIS — I10 Essential (primary) hypertension: Secondary | ICD-10-CM | POA: Diagnosis present

## 2013-04-05 DIAGNOSIS — Z9861 Coronary angioplasty status: Secondary | ICD-10-CM

## 2013-04-05 DIAGNOSIS — F411 Generalized anxiety disorder: Secondary | ICD-10-CM | POA: Diagnosis present

## 2013-04-05 DIAGNOSIS — E785 Hyperlipidemia, unspecified: Secondary | ICD-10-CM | POA: Diagnosis present

## 2013-04-05 LAB — BASIC METABOLIC PANEL WITH GFR
BUN: 42 mg/dL — ABNORMAL HIGH (ref 6–23)
CO2: 28 meq/L (ref 19–32)
Calcium: 8.2 mg/dL — ABNORMAL LOW (ref 8.4–10.5)
Chloride: 101 meq/L (ref 96–112)
Creatinine, Ser: 1.48 mg/dL — ABNORMAL HIGH (ref 0.50–1.10)
GFR calc Af Amer: 38 mL/min — ABNORMAL LOW
GFR calc non Af Amer: 33 mL/min — ABNORMAL LOW
Glucose, Bld: 395 mg/dL — ABNORMAL HIGH (ref 70–99)
Potassium: 3 meq/L — ABNORMAL LOW (ref 3.7–5.3)
Sodium: 143 meq/L (ref 137–147)

## 2013-04-05 LAB — CBC
HCT: 39.7 % (ref 36.0–46.0)
Hemoglobin: 13.5 g/dL (ref 12.0–15.0)
MCH: 32.7 pg (ref 26.0–34.0)
MCHC: 34 g/dL (ref 30.0–36.0)
MCV: 96.1 fL (ref 78.0–100.0)
PLATELETS: 124 10*3/uL — AB (ref 150–400)
RBC: 4.13 MIL/uL (ref 3.87–5.11)
RDW: 13.1 % (ref 11.5–15.5)
WBC: 3.8 10*3/uL — AB (ref 4.0–10.5)

## 2013-04-05 LAB — COMPREHENSIVE METABOLIC PANEL
ALT: 193 U/L — ABNORMAL HIGH (ref 0–35)
AST: 156 U/L — AB (ref 0–37)
Albumin: 2.9 g/dL — ABNORMAL LOW (ref 3.5–5.2)
Alkaline Phosphatase: 218 U/L — ABNORMAL HIGH (ref 39–117)
BUN: 48 mg/dL — AB (ref 6–23)
CO2: 30 mEq/L (ref 19–32)
Calcium: 8.6 mg/dL (ref 8.4–10.5)
Chloride: 90 mEq/L — ABNORMAL LOW (ref 96–112)
Creatinine, Ser: 1.77 mg/dL — ABNORMAL HIGH (ref 0.50–1.10)
GFR calc Af Amer: 31 mL/min — ABNORMAL LOW (ref >90)
GFR calc non Af Amer: 27 mL/min — ABNORMAL LOW (ref >90)
Glucose, Bld: 813 mg/dL (ref 70–99)
Potassium: 3.6 mEq/L — ABNORMAL LOW (ref 3.7–5.3)
Sodium: 136 mEq/L — ABNORMAL LOW (ref 137–147)
TOTAL PROTEIN: 7 g/dL (ref 6.0–8.3)
Total Bilirubin: 0.3 mg/dL (ref 0.3–1.2)

## 2013-04-05 LAB — CBC WITH DIFFERENTIAL/PLATELET
BASOS ABS: 0 10*3/uL (ref 0.0–0.1)
Basophils Relative: 0 % (ref 0–1)
EOS ABS: 0 10*3/uL (ref 0.0–0.7)
Eosinophils Relative: 1 % (ref 0–5)
HCT: 43.5 % (ref 36.0–46.0)
HEMOGLOBIN: 14.7 g/dL (ref 12.0–15.0)
Lymphocytes Relative: 27 % (ref 12–46)
Lymphs Abs: 1.3 10*3/uL (ref 0.7–4.0)
MCH: 32.9 pg (ref 26.0–34.0)
MCHC: 33.8 g/dL (ref 30.0–36.0)
MCV: 97.3 fL (ref 78.0–100.0)
MONOS PCT: 5 % (ref 3–12)
Monocytes Absolute: 0.2 10*3/uL (ref 0.1–1.0)
NEUTROS ABS: 3.2 10*3/uL (ref 1.7–7.7)
Neutrophils Relative %: 68 % (ref 43–77)
PLATELETS: 147 10*3/uL — AB (ref 150–400)
RBC: 4.47 MIL/uL (ref 3.87–5.11)
RDW: 13.3 % (ref 11.5–15.5)
WBC: 4.7 10*3/uL (ref 4.0–10.5)

## 2013-04-05 LAB — BASIC METABOLIC PANEL
BUN: 44 mg/dL — AB (ref 6–23)
CO2: 29 mEq/L (ref 19–32)
CREATININE: 1.59 mg/dL — AB (ref 0.50–1.10)
Calcium: 8.1 mg/dL — ABNORMAL LOW (ref 8.4–10.5)
Chloride: 97 mEq/L (ref 96–112)
GFR, EST AFRICAN AMERICAN: 35 mL/min — AB (ref >90)
GFR, EST NON AFRICAN AMERICAN: 30 mL/min — AB (ref >90)
GLUCOSE: 597 mg/dL — AB (ref 70–99)
Potassium: 2.7 mEq/L — CL (ref 3.7–5.3)
Sodium: 142 mEq/L (ref 137–147)

## 2013-04-05 LAB — GLUCOSE, CAPILLARY
Glucose-Capillary: >600 mg/dL (ref 70–99)
Glucose-Capillary: >600 mg/dL (ref 70–99)
Glucose-Capillary: 526 mg/dL — ABNORMAL HIGH (ref 70–99)
Glucose-Capillary: 394 mg/dL — ABNORMAL HIGH (ref 70–99)
Glucose-Capillary: 353 mg/dL — ABNORMAL HIGH (ref 70–99)

## 2013-04-05 LAB — MRSA PCR SCREENING: MRSA by PCR: NEGATIVE

## 2013-04-05 MED ORDER — SODIUM CHLORIDE 0.9 % IV SOLN
INTRAVENOUS | Status: DC
  Administered 2013-04-05: 20:00:00 via INTRAVENOUS

## 2013-04-05 MED ORDER — SODIUM CHLORIDE 0.9 % IV SOLN: INTRAVENOUS | Status: DC

## 2013-04-05 MED ORDER — POTASSIUM CHLORIDE 10 MEQ/100ML IV SOLN
10.0000 meq | INTRAVENOUS | Status: AC
  Administered 2013-04-06 (×2): 10 meq via INTRAVENOUS
  Filled 2013-04-05 (×3): qty 100

## 2013-04-05 MED ORDER — DEXTROSE-NACL 5-0.45 % IV SOLN
INTRAVENOUS | Status: DC
  Administered 2013-04-06: 02:00:00 via INTRAVENOUS

## 2013-04-05 MED ORDER — SODIUM CHLORIDE 0.9 % IV BOLUS (SEPSIS)
1000.0000 mL | Freq: Once | INTRAVENOUS | Status: AC
  Administered 2013-04-05: 1000 mL via INTRAVENOUS

## 2013-04-05 MED ORDER — INSULIN ASPART 100 UNIT/ML IV SOLN
15.0000 [IU] | Freq: Once | INTRAVENOUS | Status: AC
  Administered 2013-04-05: 15 [IU] via SUBCUTANEOUS

## 2013-04-05 MED ORDER — SODIUM CHLORIDE 0.9 % IV SOLN
INTRAVENOUS | Status: AC
  Administered 2013-04-05: 18:00:00 via INTRAVENOUS

## 2013-04-05 MED ORDER — DEXTROSE 50 % IV SOLN: 25.0000 mL | INTRAVENOUS | Status: DC | PRN

## 2013-04-05 MED ORDER — POTASSIUM CHLORIDE 10 MEQ/100ML IV SOLN: 10.0000 meq | INTRAVENOUS | Status: AC

## 2013-04-05 MED ORDER — POTASSIUM CHLORIDE 10 MEQ/100ML IV SOLN
10.0000 meq | INTRAVENOUS | Status: AC
  Administered 2013-04-05 (×2): 10 meq via INTRAVENOUS
  Filled 2013-04-05: qty 100

## 2013-04-05 MED ORDER — SODIUM CHLORIDE 0.9 % IV SOLN
INTRAVENOUS | Status: DC
  Administered 2013-04-05: 15.1 [IU]/h via INTRAVENOUS
  Administered 2013-04-05: 7.5 [IU]/h via INTRAVENOUS
  Filled 2013-04-05 (×2): qty 1

## 2013-04-05 NOTE — ED Notes (Signed)
Pt.bloodsugar is over 600 mg 

## 2013-04-05 NOTE — ED Notes (Signed)
CBG reading "HI"

## 2013-04-05 NOTE — ED Provider Notes (Signed)
CSN: [id number]     Arrival date & time 04/05/13  1343 History   First MD Initiated Contact with Patient 04/05/13 1432     Chief Complaint  Patient presents with  . Hyperglycemia  . Weakness   (Consider location/radiation/quality/duration/timing/severity/associated sxs/prior Treatment) Patient is a 80 y.o. female presenting with hyperglycemia and weakness. The history is provided by a relative (the daughter states the pts sugar has been very high).  Hyperglycemia Onset quality:  Sudden Timing:  Constant Progression:  Unchanged Chronicity:  Recurrent Diabetes status:  Controlled with insulin Context: not change in medication   Associated symptoms: no abdominal pain, no chest pain and no fatigue   Weakness Pertinent negatives include no chest pain, no abdominal pain and no headaches.    Past Medical History  Diagnosis Date  . Diabetes mellitus   . Hypertension   . Coronary artery disease   . Renal disorder   . Herniated lumbar intervertebral disc   . Cataract     cataract surgery scheduled for 03/31/11, 2 - left eye, 1 - right eye  . Renovascular hypertension      s/p left renal artery stent 12/2007.  S/P balloon angioplasty on 02/16/10 for ISR, BP was  controlled well since then.     . Claudication in peripheral vascular disease     02/16/10: Left CIA 9.0x28 Omnilink and REIA 8.0x40 seff expanding Zilver.   right subclavian artery stent 03/18/2008,  . S/P carotid endarterectomy      left carotid endarterectomy  1991; Stroke and TIA in 1999 with right sided weakness, now with residual right arm weakness  . Stroke     TIA  . Peripheral vascular disease   . Anxiety   . Arthritis    Past Surgical History  Procedure Laterality Date  . Appendectomy    . Abdominal hysterectomy    . Ureteral stent placement    . Carotid endarterectomy    . Laminectomy    . Back surgery    . Colonoscopy  07/30/2011    Procedure: COLONOSCOPY;  Surgeon: Inda Castle, MD;  Location: [facility];  Service: Endoscopy;  Laterality: N/A;  gi bleed  . Esophagogastroduodenoscopy  07/30/2011    Procedure: ESOPHAGOGASTRODUODENOSCOPY (EGD);  Surgeon: Inda Castle, MD;  Location: [facility];  Service: Endoscopy;  Laterality: N/A;  . Pacemaker insertion     Family History  Problem Relation Age of Onset  . Cancer Father    History  Substance Use Topics  . Smoking status: Current Every Day Smoker -- 0.50 packs/day for 65 years    Types: Cigarettes  . Smokeless tobacco: Current User  . Alcohol Use: No   OB History   Grav Para Term Preterm Abortions TAB SAB Ect Mult Living                 Review of Systems  Constitutional: Negative for appetite change and fatigue.  HENT: Negative for congestion, ear discharge and sinus pressure.   Eyes: Negative for discharge.  Respiratory: Negative for cough.   Cardiovascular: Negative for chest pain.  Gastrointestinal: Negative for abdominal pain and diarrhea.  Genitourinary: Negative for frequency and hematuria.  Musculoskeletal: Negative for back pain.  Skin: Negative for rash.  Neurological: Positive for weakness. Negative for seizures and headaches.  Psychiatric/Behavioral: Negative for hallucinations.    Allergies  Norvasc; Lasix; and Peanut-containing drug products  Home Medications   Current Outpatient Rx  Name  Route  Sig  Dispense  Refill  .  acetaminophen-codeine (TYLENOL #3) 300-30 MG per tablet   Oral   Take 1 tablet by mouth every 4 (four) hours as needed for moderate pain.         Marland Kitchen aspirin EC 81 MG tablet   Oral   Take 81 mg by mouth daily.         . clopidogrel (PLAVIX) 75 MG tablet   Oral   Take 75 mg by mouth daily with breakfast.         . ergocalciferol (VITAMIN D2) 50000 UNITS capsule   Oral   Take 50,000 Units by mouth 2 (two) times a week. Tuesday and friday         . furosemide (LASIX) 40 MG tablet   Oral   Take 40 mg by mouth 2 (two) times daily.         . hydrALAZINE  (APRESOLINE) 50 MG tablet   Oral   Take 50 mg by mouth 4 (four) times daily.         . insulin NPH-regular (NOVOLIN 70/30) (70-30) 100 UNIT/ML injection   Subcutaneous   Inject 10 Units into the skin daily with breakfast.         . isosorbide mononitrate (IMDUR) 120 MG 24 hr tablet   Oral   Take 1 tablet (120 mg total) by mouth daily.   30 tablet   0   . losartan (COZAAR) 100 MG tablet   Oral   Take 1 tablet (100 mg total) by mouth daily.   30 tablet   0   . metoprolol (LOPRESSOR) 100 MG tablet   Oral   Take 100 mg by mouth 2 (two) times daily.         . rosuvastatin (CRESTOR) 10 MG tablet   Oral   Take 10 mg by mouth daily.         Marland Kitchen spironolactone (ALDACTONE) 25 MG tablet   Oral   Take 1 tablet (25 mg total) by mouth daily.   30 tablet   0    BP 126/52  Pulse 60  Temp(Src) 97.6 F (36.4 C) (Oral)  Resp 20  Wt 174 lb (78.926 kg)  SpO2 96% Physical Exam  Constitutional: She is oriented to person, place, and time. She appears well-developed.  HENT:  Head: Normocephalic.  Eyes: Conjunctivae and EOM are normal. No scleral icterus.  Neck: Neck supple. No thyromegaly present.  Cardiovascular: Normal rate and regular rhythm.  Exam reveals no gallop and no friction rub.   No murmur heard. Pulmonary/Chest: No stridor. She has no wheezes. She has no rales. She exhibits no tenderness.  Abdominal: She exhibits no distension. There is no tenderness. There is no rebound.  Musculoskeletal: Normal range of motion. She exhibits no edema.  Lymphadenopathy:    She has no cervical adenopathy.  Neurological: She is oriented to person, place, and time. She exhibits normal muscle tone. Coordination normal.  Skin: No rash noted. No erythema.  Psychiatric: She has a normal mood and affect. Her behavior is normal.    ED Course  Procedures (including critical care time) Labs Review Labs Reviewed  GLUCOSE, CAPILLARY - Abnormal; Notable for the following:     Glucose-Capillary >600 (*)    All other components within normal limits  CBC WITH DIFFERENTIAL - Abnormal; Notable for the following:    Platelets 147 (*)    All other components within normal limits  COMPREHENSIVE METABOLIC PANEL - Abnormal; Notable for the following:    Sodium 136 (*)  Potassium 3.6 (*)    Chloride 90 (*)    Glucose, Bld 813 (*)    BUN 48 (*)    Creatinine, Ser 1.77 (*)    Albumin 2.9 (*)    AST 156 (*)    ALT 193 (*)    Alkaline Phosphatase 218 (*)    GFR calc non Af Amer 27 (*)    GFR calc Af Amer 31 (*)    All other components within normal limits  URINALYSIS, ROUTINE W REFLEX MICROSCOPIC   Imaging Review No results found.  EKG Interpretation   None       MDM   1. Hyperglycemia    admit   Maudry Diego, MD 04/05/13 [phone]

## 2013-04-05 NOTE — ED Notes (Signed)
Pharmacy contacted for insulin dose and drip

## 2013-04-05 NOTE — H&P (Signed)
[facility] History and Physical  Carmen [name] HBZ:[id number] DOB: 23-Feb-1934 DOA: 04/05/2013  Referring physician: ER physician PCP: Maximino Greenland, MD   Chief Complaint: weakness  HPI:  80 year old female with past medical history of DM, HTN, severe PVD and stenting, CAD and pacemaker placement, renovascular hypertension with renal artery stent who presented to [facility] ED 04/05/2013 with complaints of weakness and per family report high sugars. Pt did not have reports of falls. No chest pain, shortness of breath, palpitations. No abdominal pain, nausea or vomiting. No blood in stool or urine. No fever or chills. In ED, BP was 99/56, HR 57, Tmax 98 F and oxygen saturation 93% on room air. Blood work revealed glucose of 813 and AG of 16 and she was started on insulin drip. Additional blood work revealed potassium of 3.6 which was repleted in IV fluids.Creatinine was 1.7 but most recent baseline 1.2 (in 12/2012).  Assessment and Plan:  Principal Problem:   DKA (diabetic ketoacidoses) - DKA protocol ordered - observe in SDU for first 24 hours prior to transferring to the floor unit - will give IV fluids, NS bolus and maintenance (watch for fluid overload and reassess in am if IV fluids still needed, has h/o diastolic CHF) - CBG Q 1 Hrs and BMP Q 2 Hrs until AG closes - antiemetics PRN Active Problems:   Hypokalemia - repleted in IV fluids   DM (diabetes mellitus), type 2, uncontrolled, periph vascular complications  - check B0F - management for DKA at this time as above   CKD (chronic kidney disease), stage III - creatinine 1.2 in 12/2012 and 1.7 on this admission - she is on lasix at home which we will hold due to low BP  - she is getting IV fluids for DKA - follow up BMP in am   HTN (hypertension) - BP 99/56 on admission so all BP meds on hold   Dyslipidemia - continue statin therapy   Code Status: Full Family Communication: Pt at bedside Disposition Plan: Admit for further  evaluation  Leisa Lenz, MD  [facility] Pager [phone]  Review of Systems:  Constitutional: Negative for fever, chills and malaise/fatigue. Negative for diaphoresis.  HENT: Negative for hearing loss, ear pain, nosebleeds, congestion, sore throat, neck pain, tinnitus and ear discharge.   Eyes: Negative for blurred vision, double vision, photophobia, pain, discharge and redness.  Respiratory: Negative for cough, hemoptysis, sputum production, shortness of breath, wheezing and stridor.   Cardiovascular: Negative for chest pain, palpitations, orthopnea, claudication and leg swelling.  Gastrointestinal: Negative for nausea, vomiting and abdominal pain. Negative for heartburn, constipation, blood in stool and melena.  Genitourinary: Negative for dysuria, urgency, frequency, hematuria and flank pain.  Musculoskeletal: Negative for myalgias, back pain, joint pain and falls.  Skin: Negative for itching and rash.  Neurological: Negative for dizziness and positive for weakness. Negative for tingling, tremors, sensory change, speech change, focal weakness, loss of consciousness and headaches.  Endo/Heme/Allergies: Negative for environmental allergies and polydipsia. Does not bruise/bleed easily.  Psychiatric/Behavioral: Negative for suicidal ideas. The patient is not nervous/anxious.      Past Medical History  Diagnosis Date  . Diabetes mellitus   . Hypertension   . Coronary artery disease   . Renal disorder   . Herniated lumbar intervertebral disc   . Cataract     cataract surgery scheduled for 03/31/11, 2 - left eye, 1 - right eye  . Renovascular hypertension      s/p left renal artery  stent 12/2007.  S/P balloon angioplasty on 02/16/10 for ISR, BP was  controlled well since then.     . Claudication in peripheral vascular disease     02/16/10: Left CIA 9.0x28 Omnilink and REIA 8.0x40 seff expanding Zilver.   right subclavian artery stent 03/18/2008,  . S/P carotid endarterectomy       left carotid endarterectomy  1991; Stroke and TIA in 1999 with right sided weakness, now with residual right arm weakness  . Stroke     TIA  . Peripheral vascular disease   . Anxiety   . Arthritis    Past Surgical History  Procedure Laterality Date  . Appendectomy    . Abdominal hysterectomy    . Ureteral stent placement    . Carotid endarterectomy    . Laminectomy    . Back surgery    . Colonoscopy  07/30/2011    Procedure: COLONOSCOPY;  Surgeon: Louis Meckel, MD;  Location: [facility] ENDOSCOPY;  Service: Endoscopy;  Laterality: N/A;  gi bleed  . Esophagogastroduodenoscopy  07/30/2011    Procedure: ESOPHAGOGASTRODUODENOSCOPY (EGD);  Surgeon: Louis Meckel, MD;  Location: [facility] ENDOSCOPY;  Service: Endoscopy;  Laterality: N/A;  . Pacemaker insertion     Social History:  reports that she has been smoking Cigarettes.  She has a 32.5 pack-year smoking history. She uses smokeless tobacco. She reports that she does not drink alcohol or use illicit drugs.  Allergies  Allergen Reactions  . Norvasc [Amlodipine Besylate] Swelling  . Lasix [Furosemide]     Caused a problem with her kidneys. However, pt is taking daily at home.  . Peanut-Containing Drug Products Other (See Comments)    Cause my stomach to hurt    Family History:  Family History  Problem Relation Age of Onset  . Cancer Father      Prior to Admission medications   Medication Sig Start Date End Date Taking? Authorizing Provider  acetaminophen-codeine (TYLENOL #3) 300-30 MG per tablet Take 1 tablet by mouth every 4 (four) hours as needed for moderate pain.   Yes Historical Provider, MD  aspirin EC 81 MG tablet Take 81 mg by mouth daily.   Yes Historical Provider, MD  clopidogrel (PLAVIX) 75 MG tablet Take 75 mg by mouth daily with breakfast.   Yes Historical Provider, MD  ergocalciferol (VITAMIN D2) 50000 UNITS capsule Take 50,000 Units by mouth 2 (two) times a week. Tuesday and friday   Yes Historical Provider, MD  furosemide  (LASIX) 40 MG tablet Take 40 mg by mouth 2 (two) times daily.   Yes Historical Provider, MD  hydrALAZINE (APRESOLINE) 50 MG tablet Take 50 mg by mouth 4 (four) times daily.   Yes Historical Provider, MD  insulin NPH-regular (NOVOLIN 70/30) (70-30) 100 UNIT/ML injection Inject 10 Units into the skin daily with breakfast. 12/12/12  Yes Hollice Espy, MD  isosorbide mononitrate (IMDUR) 120 MG 24 hr tablet Take 1 tablet (120 mg total) by mouth daily. 12/12/12  Yes Hollice Espy, MD  losartan (COZAAR) 100 MG tablet Take 1 tablet (100 mg total) by mouth daily. 08/16/12  Yes Elease Etienne, MD  metoprolol (LOPRESSOR) 100 MG tablet Take 100 mg by mouth 2 (two) times daily.   Yes Historical Provider, MD  rosuvastatin (CRESTOR) 10 MG tablet Take 10 mg by mouth daily.   Yes Historical Provider, MD  spironolactone (ALDACTONE) 25 MG tablet Take 1 tablet (25 mg total) by mouth daily. 12/12/12  Yes Hollice Espy, MD  Physical Exam: Filed Vitals:   04/05/13 1351 04/05/13 1457  BP: 99/56 126/52  Pulse: 57 60  Temp: 97.6 F (36.4 C)   TempSrc: Oral   Resp: 16 20  Height: 5\' 4"  (1.626 m)   Weight: 78.926 kg (174 lb)   SpO2: 94% 96%    Physical Exam  Constitutional: Appears ill, no acute distress HENT: Normocephalic. External right and left ear normal. Dry mucus membranes  Eyes: Conjunctivae and EOM are normal. PERRLA, no scleral icterus.  Neck: Normal ROM. Neck supple. No JVD. No tracheal deviation. No thyromegaly.  CVS: RRR, S1/S2 appreciated  Pulmonary: Effort and breath sounds normal, no stridor Abdominal: Soft. BS +,  no distension, tenderness, rebound or guarding.  Musculoskeletal: Normal range of motion. No edema and no tenderness.  Lymphadenopathy: No lymphadenopathy noted, cervical, inguinal. Neuro: Alert. No focal neurologic deficits  Skin: Skin is warm and dry.  Psychiatric: Normal mood and affect.   Labs on Admission:  Basic Metabolic Panel:  Recent Labs Lab 04/05/13 1356   NA 136*  K 3.6*  CL 90*  CO2 30  GLUCOSE 813*  BUN 48*  CREATININE 1.77*  CALCIUM 8.6   Liver Function Tests:  Recent Labs Lab 04/05/13 1356  AST 156*  ALT 193*  ALKPHOS 218*  BILITOT 0.3  PROT 7.0  ALBUMIN 2.9*   No results found for this basename: LIPASE, AMYLASE,  in the last 168 hours No results found for this basename: AMMONIA,  in the last 168 hours CBC:  Recent Labs Lab 04/05/13 1356  WBC 4.7  NEUTROABS 3.2  HGB 14.7  HCT 43.5  MCV 97.3  PLT 147*   Cardiac Enzymes: No results found for this basename: CKTOTAL, CKMB, CKMBINDEX, TROPONINI,  in the last 168 hours BNP: No components found with this basename: POCBNP,  CBG:  Recent Labs Lab 04/05/13 1350 04/05/13 1708  GLUCAP >600* >600*    If 7PM-7AM, please contact night-coverage [email or web address] Password [facility] 04/05/2013, 5:12 PM

## 2013-04-05 NOTE — ED Notes (Signed)
Pharmacy contacted again about Carmen [name] dose of insulin

## 2013-04-05 NOTE — ED Notes (Signed)
Dr Roderic Palau made aware of critical glucose of 813

## 2013-04-05 NOTE — ED Notes (Signed)
Pt eating sandwich.

## 2013-04-05 NOTE — ED Notes (Signed)
Pt's daughter states her blood sugar has read high since Monday. Pt generally weak, some nausea. No pain. BG >600. Lack of appetite.

## 2013-04-06 ENCOUNTER — Inpatient Hospital Stay (HOSPITAL_COMMUNITY): Payer: Medicare Other

## 2013-04-06 DIAGNOSIS — R740 Nonspecific elevation of levels of transaminase and lactic acid dehydrogenase [LDH]: Secondary | ICD-10-CM

## 2013-04-06 DIAGNOSIS — E860 Dehydration: Secondary | ICD-10-CM | POA: Diagnosis present

## 2013-04-06 DIAGNOSIS — R531 Weakness: Secondary | ICD-10-CM | POA: Diagnosis present

## 2013-04-06 DIAGNOSIS — N189 Chronic kidney disease, unspecified: Secondary | ICD-10-CM

## 2013-04-06 DIAGNOSIS — E1101 Type 2 diabetes mellitus with hyperosmolarity with coma: Secondary | ICD-10-CM

## 2013-04-06 DIAGNOSIS — R7401 Elevation of levels of liver transaminase levels: Secondary | ICD-10-CM | POA: Diagnosis present

## 2013-04-06 DIAGNOSIS — I10 Essential (primary) hypertension: Secondary | ICD-10-CM

## 2013-04-06 LAB — CBC WITH DIFFERENTIAL/PLATELET
Basophils Absolute: 0 10*3/uL (ref 0.0–0.1)
Basophils Relative: 0 % (ref 0–1)
Eosinophils Absolute: 0.1 10*3/uL (ref 0.0–0.7)
Eosinophils Relative: 1 % (ref 0–5)
HEMATOCRIT: 42.7 % (ref 36.0–46.0)
Hemoglobin: 14.9 g/dL (ref 12.0–15.0)
LYMPHS ABS: 2 10*3/uL (ref 0.7–4.0)
LYMPHS PCT: 31 % (ref 12–46)
MCH: 32.7 pg (ref 26.0–34.0)
MCHC: 34.9 g/dL (ref 30.0–36.0)
MCV: 93.8 fL (ref 78.0–100.0)
MONOS PCT: 5 % (ref 3–12)
Monocytes Absolute: 0.3 10*3/uL (ref 0.1–1.0)
NEUTROS ABS: 4 10*3/uL (ref 1.7–7.7)
Neutrophils Relative %: 63 % (ref 43–77)
Platelets: 133 10*3/uL — ABNORMAL LOW (ref 150–400)
RBC: 4.55 MIL/uL (ref 3.87–5.11)
RDW: 12.7 % (ref 11.5–15.5)
WBC: 6.4 10*3/uL (ref 4.0–10.5)

## 2013-04-06 LAB — PROTIME-INR
INR: 1.08 (ref 0.00–1.49)
Prothrombin Time: 13.8 seconds (ref 11.6–15.2)

## 2013-04-06 LAB — BASIC METABOLIC PANEL
BUN: 39 mg/dL — ABNORMAL HIGH (ref 6–23)
BUN: 37 mg/dL — ABNORMAL HIGH (ref 6–23)
BUN: 36 mg/dL — ABNORMAL HIGH (ref 6–23)
CALCIUM: 8.4 mg/dL (ref 8.4–10.5)
CO2: 27 mEq/L (ref 19–32)
CO2: 28 mEq/L (ref 19–32)
CO2: 26 mEq/L (ref 19–32)
Calcium: 8.5 mg/dL (ref 8.4–10.5)
Calcium: 8.2 mg/dL — ABNORMAL LOW (ref 8.4–10.5)
Chloride: 101 mEq/L (ref 96–112)
Chloride: 103 mEq/L (ref 96–112)
Chloride: 104 mEq/L (ref 96–112)
Creatinine, Ser: 1.34 mg/dL — ABNORMAL HIGH (ref 0.50–1.10)
Creatinine, Ser: 1.33 mg/dL — ABNORMAL HIGH (ref 0.50–1.10)
Creatinine, Ser: 1.22 mg/dL — ABNORMAL HIGH (ref 0.50–1.10)
GFR calc Af Amer: 43 mL/min — ABNORMAL LOW (ref >90)
GFR calc Af Amer: 43 mL/min — ABNORMAL LOW (ref >90)
GFR calc Af Amer: 48 mL/min — ABNORMAL LOW (ref >90)
GFR calc non Af Amer: 37 mL/min — ABNORMAL LOW (ref >90)
GFR calc non Af Amer: 42 mL/min — ABNORMAL LOW (ref >90)
GFR, EST NON AFRICAN AMERICAN: 37 mL/min — AB (ref >90)
GLUCOSE: 184 mg/dL — AB (ref 70–99)
Glucose, Bld: 110 mg/dL — ABNORMAL HIGH (ref 70–99)
Glucose, Bld: 148 mg/dL — ABNORMAL HIGH (ref 70–99)
Potassium: 3.4 mEq/L — ABNORMAL LOW (ref 3.7–5.3)
Potassium: 3.7 mEq/L (ref 3.7–5.3)
Potassium: 3.7 mEq/L (ref 3.7–5.3)
SODIUM: 143 meq/L (ref 137–147)
Sodium: 145 mEq/L (ref 137–147)
Sodium: 143 mEq/L (ref 137–147)

## 2013-04-06 LAB — HEMOGLOBIN A1C
Hgb A1c MFr Bld: 15.6 % — ABNORMAL HIGH (ref <5.7)
MEAN PLASMA GLUCOSE: 401 mg/dL — AB (ref <117)

## 2013-04-06 LAB — GLUCOSE, CAPILLARY
GLUCOSE-CAPILLARY: 104 mg/dL — AB (ref 70–99)
GLUCOSE-CAPILLARY: 124 mg/dL — AB (ref 70–99)
GLUCOSE-CAPILLARY: 238 mg/dL — AB (ref 70–99)
GLUCOSE-CAPILLARY: 404 mg/dL — AB (ref 70–99)
GLUCOSE-CAPILLARY: 94 mg/dL (ref 70–99)
Glucose-Capillary: 155 mg/dL — ABNORMAL HIGH (ref 70–99)
Glucose-Capillary: 92 mg/dL (ref 70–99)
Glucose-Capillary: 140 mg/dL — ABNORMAL HIGH (ref 70–99)
Glucose-Capillary: 139 mg/dL — ABNORMAL HIGH (ref 70–99)
Glucose-Capillary: 358 mg/dL — ABNORMAL HIGH (ref 70–99)
Glucose-Capillary: 324 mg/dL — ABNORMAL HIGH (ref 70–99)
Glucose-Capillary: 275 mg/dL — ABNORMAL HIGH (ref 70–99)

## 2013-04-06 LAB — COMPREHENSIVE METABOLIC PANEL
ALBUMIN: 2.6 g/dL — AB (ref 3.5–5.2)
ALT: 149 U/L — ABNORMAL HIGH (ref 0–35)
AST: 126 U/L — AB (ref 0–37)
Alkaline Phosphatase: 177 U/L — ABNORMAL HIGH (ref 39–117)
BILIRUBIN TOTAL: 0.4 mg/dL (ref 0.3–1.2)
BUN: 35 mg/dL — ABNORMAL HIGH (ref 6–23)
CHLORIDE: 101 meq/L (ref 96–112)
CO2: 27 mEq/L (ref 19–32)
CREATININE: 1.29 mg/dL — AB (ref 0.50–1.10)
Calcium: 8.4 mg/dL (ref 8.4–10.5)
GFR calc Af Amer: 45 mL/min — ABNORMAL LOW (ref >90)
GFR calc non Af Amer: 39 mL/min — ABNORMAL LOW (ref >90)
Glucose, Bld: 210 mg/dL — ABNORMAL HIGH (ref 70–99)
POTASSIUM: 3.6 meq/L — AB (ref 3.7–5.3)
Sodium: 143 mEq/L (ref 137–147)
Total Protein: 6.3 g/dL (ref 6.0–8.3)

## 2013-04-06 LAB — TROPONIN I: Troponin I: <0.3 ng/mL (ref <0.30)

## 2013-04-06 LAB — PHOSPHORUS: PHOSPHORUS: 2.5 mg/dL (ref 2.3–4.6)

## 2013-04-06 LAB — MAGNESIUM: Magnesium: 1.5 mg/dL (ref 1.5–2.5)

## 2013-04-06 LAB — APTT: aPTT: 23 seconds — ABNORMAL LOW (ref 24–37)

## 2013-04-06 LAB — HEPATITIS PANEL, ACUTE
HCV AB: NEGATIVE
HEP B C IGM: NONREACTIVE
Hep A IgM: NONREACTIVE
Hepatitis B Surface Ag: NEGATIVE

## 2013-04-06 MED ORDER — SODIUM CHLORIDE 0.9 % IJ SOLN
3.0000 mL | Freq: Two times a day (BID) | INTRAMUSCULAR | Status: DC
  Administered 2013-04-06 – 2013-04-08 (×4): 3 mL via INTRAVENOUS

## 2013-04-06 MED ORDER — SODIUM CHLORIDE 0.9 % IV SOLN: INTRAVENOUS | Status: DC

## 2013-04-06 MED ORDER — HYDROCODONE-ACETAMINOPHEN 5-325 MG PO TABS
1.0000 | ORAL_TABLET | ORAL | Status: DC | PRN
  Administered 2013-04-08: 1 via ORAL
  Filled 2013-04-06: qty 1

## 2013-04-06 MED ORDER — ACETAMINOPHEN 325 MG PO TABS: 650.0000 mg | ORAL_TABLET | Freq: Four times a day (QID) | ORAL | Status: DC | PRN

## 2013-04-06 MED ORDER — HYDRALAZINE HCL 20 MG/ML IJ SOLN
5.0000 mg | Freq: Once | INTRAMUSCULAR | Status: AC
  Administered 2013-04-06: 5 mg via INTRAVENOUS
  Filled 2013-04-06: qty 1

## 2013-04-06 MED ORDER — INSULIN GLARGINE 100 UNIT/ML ~~LOC~~ SOLN
10.0000 [IU] | Freq: Every day | SUBCUTANEOUS | Status: DC
  Administered 2013-04-06: 10 [IU] via SUBCUTANEOUS
  Filled 2013-04-06 (×2): qty 0.1

## 2013-04-06 MED ORDER — SODIUM CHLORIDE 0.9 % IV SOLN
INTRAVENOUS | Status: DC
  Administered 2013-04-06: 1000 mL via INTRAVENOUS

## 2013-04-06 MED ORDER — ACETAMINOPHEN 650 MG RE SUPP: 650.0000 mg | Freq: Four times a day (QID) | RECTAL | Status: DC | PRN

## 2013-04-06 MED ORDER — CLOPIDOGREL BISULFATE 75 MG PO TABS
75.0000 mg | ORAL_TABLET | Freq: Every day | ORAL | Status: DC
  Administered 2013-04-06 – 2013-04-08 (×3): 75 mg via ORAL
  Filled 2013-04-06 (×4): qty 1

## 2013-04-06 MED ORDER — ERGOCALCIFEROL 1.25 MG (50000 UT) PO CAPS: 50000.0000 [IU] | ORAL_CAPSULE | ORAL | Status: DC

## 2013-04-06 MED ORDER — ASPIRIN EC 81 MG PO TBEC
81.0000 mg | DELAYED_RELEASE_TABLET | Freq: Every day | ORAL | Status: DC
  Administered 2013-04-06 – 2013-04-08 (×3): 81 mg via ORAL
  Filled 2013-04-06 (×3): qty 1

## 2013-04-06 MED ORDER — METOPROLOL TARTRATE 100 MG PO TABS
100.0000 mg | ORAL_TABLET | Freq: Two times a day (BID) | ORAL | Status: DC
  Administered 2013-04-06 – 2013-04-08 (×5): 100 mg via ORAL
  Filled 2013-04-06 (×6): qty 1

## 2013-04-06 MED ORDER — INSULIN ASPART 100 UNIT/ML ~~LOC~~ SOLN
0.0000 [IU] | Freq: Three times a day (TID) | SUBCUTANEOUS | Status: DC
  Administered 2013-04-06: 9 [IU] via SUBCUTANEOUS
  Administered 2013-04-06 – 2013-04-07 (×2): 7 [IU] via SUBCUTANEOUS
  Administered 2013-04-07 (×2): 5 [IU] via SUBCUTANEOUS
  Administered 2013-04-08: 2 [IU] via SUBCUTANEOUS

## 2013-04-06 MED ORDER — INSULIN GLARGINE 100 UNIT/ML ~~LOC~~ SOLN: 10.0000 [IU] | SUBCUTANEOUS | Status: DC

## 2013-04-06 MED ORDER — ONDANSETRON HCL 4 MG PO TABS: 4.0000 mg | ORAL_TABLET | Freq: Four times a day (QID) | ORAL | Status: DC | PRN

## 2013-04-06 MED ORDER — ONDANSETRON HCL 4 MG/2ML IJ SOLN: 4.0000 mg | Freq: Four times a day (QID) | INTRAMUSCULAR | Status: DC | PRN

## 2013-04-06 MED ORDER — ISOSORBIDE MONONITRATE ER 60 MG PO TB24
120.0000 mg | ORAL_TABLET | Freq: Every day | ORAL | Status: DC
  Administered 2013-04-06 – 2013-04-08 (×3): 120 mg via ORAL
  Filled 2013-04-06 (×3): qty 2

## 2013-04-06 MED ORDER — HYDRALAZINE HCL 50 MG PO TABS
50.0000 mg | ORAL_TABLET | Freq: Four times a day (QID) | ORAL | Status: DC
  Administered 2013-04-06 – 2013-04-08 (×10): 50 mg via ORAL
  Filled 2013-04-06 (×12): qty 1

## 2013-04-06 MED ORDER — SPIRONOLACTONE 25 MG PO TABS
25.0000 mg | ORAL_TABLET | Freq: Every day | ORAL | Status: DC
  Administered 2013-04-06 – 2013-04-08 (×3): 25 mg via ORAL
  Filled 2013-04-06 (×3): qty 1

## 2013-04-06 MED ORDER — INSULIN ASPART 100 UNIT/ML ~~LOC~~ SOLN
3.0000 [IU] | Freq: Three times a day (TID) | SUBCUTANEOUS | Status: DC
  Administered 2013-04-06 – 2013-04-08 (×5): 3 [IU] via SUBCUTANEOUS

## 2013-04-06 MED ORDER — ATORVASTATIN CALCIUM 20 MG PO TABS
20.0000 mg | ORAL_TABLET | Freq: Every day | ORAL | Status: DC
  Filled 2013-04-06: qty 1

## 2013-04-06 MED ORDER — INSULIN GLARGINE 100 UNIT/ML ~~LOC~~ SOLN
15.0000 [IU] | Freq: Every day | SUBCUTANEOUS | Status: DC
  Administered 2013-04-07: 15 [IU] via SUBCUTANEOUS
  Filled 2013-04-06: qty 0.15

## 2013-04-06 MED ORDER — INSULIN GLARGINE 100 UNIT/ML ~~LOC~~ SOLN
5.0000 [IU] | Freq: Once | SUBCUTANEOUS | Status: AC
  Administered 2013-04-06: 5 [IU] via SUBCUTANEOUS
  Filled 2013-04-06: qty 0.05

## 2013-04-06 MED ORDER — VITAMIN D (ERGOCALCIFEROL) 1.25 MG (50000 UNIT) PO CAPS
50000.0000 [IU] | ORAL_CAPSULE | ORAL | Status: DC
  Administered 2013-04-06: 50000 [IU] via ORAL
  Filled 2013-04-06: qty 1

## 2013-04-06 NOTE — Progress Notes (Signed)
Utilization review completed. Mykiah Schmuck, RN, BSN. 

## 2013-04-06 NOTE — Evaluation (Signed)
Physical Therapy Evaluation Patient Details Name: LATERA MCLIN MRN: [id number] DOB: 02/07/1934 Today's Date: 04/06/2013 Time: 1751-0258 PT Time Calculation (min): 17 min  PT Assessment / Plan / Recommendation History of Present Illness  80 year old female with past medical history of DM, HTN, severe PVD and stenting, CAD and pacemaker placement, renovascular hypertension with renal artery stent who presented to [facility] ED 04/05/2013 with complaints of weakness and per family report high sugars. Pt did not have reports of falls  Clinical Impression  Pt moving well and needs cues for gait and need for RW with gait to increase safety and speed. Pt with below deficits and will benefit from therapy acutely to maximize mobility, function , safety and ambulation to decrease fall risk. Recommend daily ambulation with nursing staff. Pt in bed end of session for transfer to [place].    PT Assessment  Patient needs continued PT services    Follow Up Recommendations  No PT follow up;Supervision - Intermittent    Does the patient have the potential to tolerate intense rehabilitation      Barriers to Discharge        Equipment Recommendations  None recommended by PT    Recommendations for Other Services     Frequency Min 3X/week    Precautions / Restrictions Precautions Precautions: Fall   Pertinent Vitals/Pain No pain VSS     Mobility  Bed Mobility Overal bed mobility: Modified Independent Transfers Overall transfer level: Modified independent Ambulation/Gait Ambulation/Gait assistance: Supervision Ambulation Distance (Feet): 150 Feet Assistive device: Rolling walker (2 wheeled) Gait Pattern/deviations: Step-through pattern;Decreased stride length Gait velocity: 20/23=.87 Gait velocity interpretation: <1.8 ft/sec, indicative of risk for recurrent falls General Gait Details: pt ambulated first 20 feet without RW with very short slow shuffle steps, with RW improved stride and speed with  education to use at all times with pt stating she would comply, cues for posture and position in RW as well as to increase stride length Stairs: Yes Stairs assistance: Modified independent (Device/Increase time) Stair Management: One rail Right;Step to pattern;Forwards Number of Stairs: 3    Exercises     PT Diagnosis: Difficulty walking  PT Problem List: Decreased strength;Decreased activity tolerance;Decreased balance;Decreased knowledge of use of DME PT Treatment Interventions: Gait training;DME instruction;Therapeutic activities;Therapeutic exercise;Functional mobility training;Patient/family education     PT Goals(Current goals can be found in the care plan section) Acute Rehab PT Goals Patient Stated Goal: none stated agreeable to increase gait acutely PT Goal Formulation: With patient Time For Goal Achievement: 04/20/13 Potential to Achieve Goals: Good  Visit Information  Last PT Received On: 04/06/13 Assistance Needed: +1 History of Present Illness: 80 year old female with past medical history of DM, HTN, severe PVD and stenting, CAD and pacemaker placement, renovascular hypertension with renal artery stent who presented to [facility] ED 04/05/2013 with complaints of weakness and per family report high sugars. Pt did not have reports of falls       Prior Rosine expects to be discharged to:: Private residence Living Arrangements: Spouse/significant other;Children Available Help at Discharge: Family;Available 24 hours/day Type of Home: House Home Access: Stairs to enter [employer] of Steps: 3 Entrance Stairs-Rails: Right Home Layout: One level Home Equipment: Clinical cytogeneticist - 2 wheels;Cane - single point;Bedside commode Additional Comments: was living at home with spouse, walked indep without AD. family did assist with bathing/dressing occasionally Prior Function Level of Independence: Independent Comments: family does housework  and cooking Communication Communication: No difficulties  Cognition  Cognition Arousal/Alertness: Awake/alert Behavior During Therapy: WFL for tasks assessed/performed Overall Cognitive Status: Impaired/Different from baseline Area of Impairment: Orientation Orientation Level: Time    Extremity/Trunk Assessment Upper Extremity Assessment Upper Extremity Assessment: Generalized weakness Lower Extremity Assessment Lower Extremity Assessment: Generalized weakness Cervical / Trunk Assessment Cervical / Trunk Assessment: Normal   Balance    End of Session PT - End of Session Equipment Utilized During Treatment: Gait belt Activity Tolerance: Patient tolerated treatment well Patient left: in bed;with call bell/phone within reach Nurse Communication: Mobility status  GP     Melford Aase 04/06/2013, 2:29 PM Elwyn Reach, [place]

## 2013-04-06 NOTE — Progress Notes (Signed)
Pts. SBP was in the 180's-190's, denies any symptoms taken both arms, K. SchorrNP made aware with orders made.Hydralazine 5mg . IV x1 was given at 0325 with good result.. BP was 157/58 after 1 dose of hydralazine. Will cont. to monitor.

## 2013-04-06 NOTE — Progress Notes (Signed)
[facility] PROGRESS NOTE  Carmen [name] WNU:[id number] DOB: 1933-03-18 DOA: 04/05/2013 PCP: Maximino Greenland, MD  Assessment/Plan: #1 hyperosmolar nonketotic hyperglycemia Questionable etiology. On admission patient was noted to have an anion gap of 16 however was not in acidosis. Patient states she's been compliant was with her medications. We'll cycle cardiac enzymes every 6 hours x3. EKG which was done showed some T-wave inversions in the 21 aVL and BP and T wave inversions in V5 and V6 which were more pronounced than on prior EKG. Will check a 2-D echo. Check a chest x-ray. Check a UA with cultures and sensitivities. Patient has been transition of the insulin drip and is now down Lantus 10 units daily. Check a hemoglobin A1c. Place on sliding scale insulin. We'll place on a Cobb modified diet. Follow.  #2 generalized weakness Questionable etiology. May be secondary to problem #1. Will check a chest x-ray to rule out an acute infiltrate. Check a urinalysis. Cycle cardiac enzymes. Check a 2-D echo. Continue IV fluids. PT/OT.  #3 hypokalemia Likely secondary to problem #1. Resolved. Follow.  #4 transaminitis Questionable etiology. Abdominal ultrasound is pending. Will check an acute hepatitis panel. Hold statin for now. Follow.  #5 acute on chronic kidney disease stage III On admission patient was noted to have an elevated creatinine of 1.7 from a baseline of approximately 1.2. Likely a prerenal azotemia secondary to dehydration in the setting of ARB and diuretics. Add IV on hold. Renal function improved and with hydration. Decrease IV fluids to 50 cc an hour. Abdominal ultrasound pending. Follow.  #6 hypertension Resume home regimen of hydralazine, Imdur, Lopressor, spironolactone. Hold Cozaar.  #7 hyperlipidemia Continue Lipitor.  #8 coronary artery disease status post permanent pacemaker Stable. Will cycle enzymes. Check a 2-D echo. Patient denies any chest pain however  presented with generalized weakness. Continue aspirin, spironolactone, metoprolol, aspirin, Plavix. Cozaar on hold secondary to problem #5.  #9 prophylaxis SCDs for DVT prophylaxis.   Code Status: Full Family Communication: Updated patient no family present. Disposition Plan: Home when medically stable.   Consultants:  None  Procedures:  None  Antibiotics:  None  HPI/Subjective: Patient sitting in chair without complaints. Patient denies any chest pain. No shortness of breath. Patient states she's feeling better. Patient asking for food.  Objective: Filed Vitals:   04/06/13 0811  BP: 144/66  Pulse: 60  Temp: 98 F (36.7 C)  Resp: 14    Intake/Output Summary (Last 24 hours) at 04/06/13 0959 Last data filed at 04/06/13 0900  Gross per 24 hour  Intake 1678.75 ml  Output    450 ml  Net 1228.75 ml   Filed Weights   04/05/13 1351 04/05/13 1457 04/05/13 1851  Weight: 78.926 kg (174 lb) 45.36 kg (100 lb) 42 kg (92 lb 9.5 oz)    Exam:   General:  Nad. Sitting in chair.  Cardiovascular: Regular rate rhythm  Respiratory: Clear to auscultation bilaterally. No wheezing, no crackles, no rhonchi.  Abdomen: Soft, nontender, nondistended, positive bowel sounds  Musculoskeletal: No clubbing cyanosis or edema.   Data Reviewed: Basic Metabolic Panel:  Recent Labs Lab 04/05/13 2017 04/05/13 2236 04/06/13 0130 04/06/13 0312 04/06/13 0700  NA 142 143 143 145 143  K 2.7* 3.0* 3.4* 3.7 3.7  CL 97 101 101 103 104  CO2 29 28 27 28 26   GLUCOSE 597* 395* 184* 110* 148*  BUN 44* 42* 39* 37* 36*  CREATININE 1.59* 1.48* 1.34* 1.33* 1.22*  CALCIUM 8.1* 8.2* 8.4 8.5 8.2*  Liver Function Tests:  Recent Labs Lab 04/05/13 1356  AST 156*  ALT 193*  ALKPHOS 218*  BILITOT 0.3  PROT 7.0  ALBUMIN 2.9*   No results found for this basename: LIPASE, AMYLASE,  in the last 168 hours No results found for this basename: AMMONIA,  in the last 168 hours CBC:  Recent  Labs Lab 04/05/13 1356 04/05/13 2030  WBC 4.7 3.8*  NEUTROABS 3.2  --   HGB 14.7 13.5  HCT 43.5 39.7  MCV 97.3 96.1  PLT 147* 124*   Cardiac Enzymes:  Recent Labs Lab 04/06/13 0700  TROPONINI <0.30   BNP (last 3 results)  Recent Labs  08/12/12 1112 11/30/12 1509 12/11/12 1400  PROBNP 2540.0* 10923.0* 3706.0*   CBG:  Recent Labs Lab 04/06/13 0214 04/06/13 0319 04/06/13 0424 04/06/13 0529 04/06/13 0635  GLUCAP 94 92 104* 140* 124*    Recent Results (from the past 240 hour(s))  MRSA PCR SCREENING     Status: None   Collection Time    04/05/13  7:13 PM      Result Value Range Status   MRSA by PCR NEGATIVE  NEGATIVE Final   Comment:            The GeneXpert MRSA Assay (FDA     approved for NASAL specimens     only), is one component of a     comprehensive MRSA colonization     surveillance program. It is not     intended to diagnose MRSA     infection nor to guide or     monitor treatment for     MRSA infections.     Studies: No results found.  Scheduled Meds: . aspirin EC  81 mg Oral Daily  . atorvastatin  20 mg Oral q1800  . clopidogrel  75 mg Oral Q breakfast  . hydrALAZINE  50 mg Oral QID  . insulin aspart  0-9 Units Subcutaneous TID WC  . insulin glargine  10 Units Subcutaneous Daily  . isosorbide mononitrate  120 mg Oral Daily  . metoprolol  100 mg Oral BID  . sodium chloride  3 mL Intravenous Q12H  . spironolactone  25 mg Oral Daily  . Vitamin D (Ergocalciferol)  50,000 Units Oral Custom   Continuous Infusions: . sodium chloride 20 mL/hr (04/06/13 0745)  . sodium chloride 75 mL/hr at 04/06/13 0004  . dextrose 5 % and 0.45% NaCl 75 mL/hr at 04/06/13 0200    Principal Problem:   Type 2 diabetes mellitus with hyperosmolar nonketotic hyperglycemia Active Problems:   Hypokalemia   DM (diabetes mellitus), type 2, uncontrolled, periph vascular complic   Peripheral neuropathy   History of CVA (cerebrovascular accident) with associated  mild right upper extremity hemiplegia   CKD (chronic kidney disease), stage III   HTN (hypertension)   Dyslipidemia   Abnormal LFTs   Weakness generalized   Dehydration   Transaminitis   Renal failure (ARF), acute on chronic    Time spent: [place] MD [facility] Pager [phone]. If 7PM-7AM, please contact night-coverage at [email or web address], password [email or web address] 04/06/2013, 9:59 AM  LOS: 1 day

## 2013-04-06 NOTE — Progress Notes (Signed)
Pt was transferred by RN and NT from 2C09 to 6E08 at 2130 via bed on telemetry with all belongings.  Granddaughter, Derry Skill, was notified of pt's transfer, per pt's request.  Ms. Rosita Fire said that she would notify the pt's daughter and pt's husband as Ms. Croker had requested.

## 2013-04-06 NOTE — Progress Notes (Signed)
Inpatient Diabetes Program Recommendations  AACE/ADA: New Consensus Statement on Inpatient Glycemic Control (2013)  Target Ranges:  Prepandial:   less than 140 mg/dL      Peak postprandial:   less than 180 mg/dL (1-2 hours)      Critically ill patients:  140 - 180 mg/dL  Results for PEGI, MILAZZO (MRN [id number]) as of 04/06/2013 13:59  Ref. Range 04/06/2013 03:19 04/06/2013 04:24 04/06/2013 05:29 04/06/2013 06:35 04/06/2013 12:28  Glucose-Capillary Latest Range: 70-99 mg/dL 92 104 (H) 140 (H) 124 (H) 404 (H)   Recommend addition of Novolog meal coverage 3 units TID (not to be given if patient eats <50% of meals) Text page sent to Dr. Grandville Silos with above recommendation. Thank you  Raoul Pitch BSN, RN,CDE Inpatient Diabetes Coordinator [phone] (team pager)

## 2013-04-06 NOTE — Progress Notes (Signed)
  Echocardiogram 2D Echocardiogram has been performed.  Carmen [name] 04/06/2013, 11:14 AM

## 2013-04-06 NOTE — Progress Notes (Signed)
Inpatient Diabetes Program Recommendations  AACE/ADA: New Consensus Statement on Inpatient Glycemic Control (2013)  Target Ranges:  Prepandial:   less than 140 mg/dL      Peak postprandial:   less than 180 mg/dL (1-2 hours)      Critically ill patients:  140 - 180 mg/dL  Results for RILLIE, RIFFEL (MRN [id number]) as of 04/06/2013 08:11  Ref. Range 04/06/2013 02:14 04/06/2013 03:19 04/06/2013 04:24 04/06/2013 05:29 04/06/2013 06:35  Glucose-Capillary Latest Range: 70-99 mg/dL 94 92 104 (H) 140 (H) 124 (H)  Outpatient Diabetes medications: 70/30 10 units with breakfast  Current orders for Inpatient glycemic control: insulin gtt  Inpatient Diabetes Program Recommendations Insulin - Basal: patient needs basal insulin prior to gtt discontinuation-recommend Lantus 10 units  Correction (SSI): Sensitive scale TID+HS  HgbA1C: order to assess prehospital glucose control (last one on record here is March 2014) Will follow. Thank you  Raoul Pitch BSN, RN,CDE Inpatient Diabetes Coordinator [phone] (team pager)

## 2013-04-07 ENCOUNTER — Encounter: Payer: Self-pay | Admitting: *Deleted

## 2013-04-07 LAB — URINE CULTURE

## 2013-04-07 LAB — GLUCOSE, CAPILLARY
Glucose-Capillary: 251 mg/dL — ABNORMAL HIGH (ref 70–99)
Glucose-Capillary: 330 mg/dL — ABNORMAL HIGH (ref 70–99)
Glucose-Capillary: 282 mg/dL — ABNORMAL HIGH (ref 70–99)
Glucose-Capillary: 131 mg/dL — ABNORMAL HIGH (ref 70–99)

## 2013-04-07 LAB — BASIC METABOLIC PANEL
BUN: 29 mg/dL — AB (ref 6–23)
CALCIUM: 8 mg/dL — AB (ref 8.4–10.5)
CO2: 24 mEq/L (ref 19–32)
CREATININE: 1.13 mg/dL — AB (ref 0.50–1.10)
Chloride: 101 mEq/L (ref 96–112)
GFR calc Af Amer: 53 mL/min — ABNORMAL LOW (ref >90)
GFR, EST NON AFRICAN AMERICAN: 46 mL/min — AB (ref >90)
GLUCOSE: 162 mg/dL — AB (ref 70–99)
Potassium: 3.6 mEq/L — ABNORMAL LOW (ref 3.7–5.3)
Sodium: 139 mEq/L (ref 137–147)

## 2013-04-07 LAB — HEPATIC FUNCTION PANEL
ALK PHOS: 146 U/L — AB (ref 39–117)
ALT: 122 U/L — ABNORMAL HIGH (ref 0–35)
AST: 96 U/L — ABNORMAL HIGH (ref 0–37)
Albumin: 2.2 g/dL — ABNORMAL LOW (ref 3.5–5.2)
BILIRUBIN TOTAL: 0.3 mg/dL (ref 0.3–1.2)
Bilirubin, Direct: <0.2 mg/dL (ref 0.0–0.3)
Total Protein: 5.6 g/dL — ABNORMAL LOW (ref 6.0–8.3)

## 2013-04-07 MED ORDER — POTASSIUM CHLORIDE CRYS ER 20 MEQ PO TBCR
20.0000 meq | EXTENDED_RELEASE_TABLET | Freq: Every day | ORAL | Status: DC
  Administered 2013-04-08: 20 meq via ORAL
  Filled 2013-04-07: qty 1

## 2013-04-07 MED ORDER — POTASSIUM CHLORIDE CRYS ER 20 MEQ PO TBCR
40.0000 meq | EXTENDED_RELEASE_TABLET | Freq: Once | ORAL | Status: AC
  Administered 2013-04-07: 40 meq via ORAL
  Filled 2013-04-07: qty 2

## 2013-04-07 MED ORDER — INSULIN GLARGINE 100 UNIT/ML ~~LOC~~ SOLN
20.0000 [IU] | Freq: Every day | SUBCUTANEOUS | Status: DC
  Administered 2013-04-08: 20 [IU] via SUBCUTANEOUS
  Filled 2013-04-07: qty 0.2

## 2013-04-07 MED ORDER — FUROSEMIDE 40 MG PO TABS
40.0000 mg | ORAL_TABLET | Freq: Every day | ORAL | Status: DC
  Administered 2013-04-07: 40 mg via ORAL
  Filled 2013-04-07: qty 1

## 2013-04-07 MED ORDER — INSULIN GLARGINE 100 UNIT/ML ~~LOC~~ SOLN
5.0000 [IU] | Freq: Once | SUBCUTANEOUS | Status: AC
  Administered 2013-04-07: 5 [IU] via SUBCUTANEOUS
  Filled 2013-04-07: qty 0.05

## 2013-04-07 MED ORDER — FUROSEMIDE 20 MG PO TABS
20.0000 mg | ORAL_TABLET | Freq: Every day | ORAL | Status: DC
  Administered 2013-04-08: 20 mg via ORAL
  Filled 2013-04-07: qty 1

## 2013-04-07 NOTE — Progress Notes (Signed)
Room air SAT walking 96-98 %, no sob or chest discomfort.

## 2013-04-07 NOTE — Evaluation (Signed)
Occupational Therapy Evaluation Patient Details Name: Carmen [name] MRN: [id number] DOB: [birth year] Today's Date: 04/07/2013 Time: 2409-7353 OT Time Calculation (min): 24 min  OT Assessment / Plan / Recommendation History of present illness 80 year old female with past medical history of DM, HTN, severe PVD and stenting, CAD and pacemaker placement, renovascular hypertension with renal artery stent who presented to [facility] ED 04/05/2013 with complaints of weakness and per family report high sugars. Pt did not have reports of falls   Clinical Impression   Education provided to pt. Feel pt is safe to d/c home with family available to assist 24/7.     OT Assessment  Patient does not need any further OT services    Follow Up Recommendations  No OT follow up;Supervision - Intermittent (OOB/mobility)    Barriers to Discharge      Equipment Recommendations  None recommended by OT    Recommendations for Other Services    Frequency       Precautions / Restrictions Precautions Precautions: Fall Restrictions Weight Bearing Restrictions: No   Pertinent Vitals/Pain Pain in right shoulder when moving it during session. No pain when asked at end of session.     ADL  Grooming: Wash/dry hands;Supervision/safety;Minimal assistance (overall Min A for grooming to open packages; washed hands at supervision) Where Assessed - Grooming: Supported standing Upper Body Dressing: Minimal assistance Where Assessed - Upper Body Dressing: Unsupported sitting Lower Body Dressing: Minimal assistance Where Assessed - Lower Body Dressing: Supported sit to stand Toilet Transfer: Magazine features editor Method: Sit to Loss adjuster, chartered: Regular height toilet;Other (comment) (OT acted as her "sink" to push up on) Toileting - Water quality scientist and Hygiene: Min guard Where Assessed - Best boy and Hygiene: Standing Tub/Shower Transfer: Minimal assistance Tub/Shower  Transfer Method: Therapist, art: Other (comment) (practiced stepping over) Transfers/Ambulation Related to ADLs: Min A with walker. Min guard/supervision for sit <> stand transfers. Min A for tub transfer. ADL Comments: Educated that buttonhook is available to assist for buttons as pt states family assists her with this. Encouraged pt to be using right hand during activities (not to overdo right shoulder) and educated on activities she can be doing with the right hand to help increase coordination. Discussed use of button up shirts and dressing technique. Recommended sitting on chair for bathing and sitting for dressing. Educated on tub transfer technique and practiced stepping over. Recommended someone being with pt for tub transfer. Discussed use of bag on walker to carry items.     OT Diagnosis:    OT Problem List:   OT Treatment Interventions:     OT Goals(Current goals can be found in the care plan section)    Visit Information  Last OT Received On: 04/07/13 Assistance Needed: +1 History of Present Illness: 80 year old female with past medical history of DM, HTN, severe PVD and stenting, CAD and pacemaker placement, renovascular hypertension with renal artery stent who presented to [facility] ED 04/05/2013 with complaints of weakness and per family report high sugars. Pt did not have reports of falls       Prior [place] expects to be discharged to:: Private residence Living Arrangements: Spouse/significant other;Children Available Help at Discharge: Family;Available 24 hours/day Type of Home: House Home Access: Stairs to enter [employer] of Steps: 3 Entrance Stairs-Rails: Right Home Layout: One level Home Equipment: Clinical cytogeneticist - 2 wheels;Cane - single point;Bedside commode Additional Comments: was living at  home with spouse, walked indep without AD. Reports family assisted with buttons Prior Function Level  of Independence: Needs assistance ADL's / Homemaking Assistance Needed: assisted with buttons; family does housework and cooking Communication Communication: No difficulties Dominant Hand: Left         Vision/Perception     Cognition  Cognition Arousal/Alertness: Awake/alert Behavior During Therapy: WFL for tasks assessed/performed Overall Cognitive Status: Within Functional Limits for tasks assessed    Extremity/Trunk Assessment Upper Extremity Assessment Upper Extremity Assessment: RUE deficits/detail RUE Deficits / Details: weak grasp and limited AROM shoulder flexion-residual weakness;reports previous CVA RUE Coordination: decreased fine motor Lower Extremity Assessment Lower Extremity Assessment: Defer to PT evaluation     Mobility Bed Mobility Overal bed mobility: Modified Independent Transfers Overall transfer level: Needs assistance Equipment used: Rolling walker (2 wheeled) Transfers: Sit to/from Stand Sit to Stand: Supervision;Min guard General transfer comment: cues for technique. Cues to control descent when sitting on toilet.     Exercise     Balance     End of Session OT - End of Session Equipment Utilized During Treatment: Gait belt;Rolling walker Activity Tolerance: Patient tolerated treatment well Patient left: in bed;with call bell/phone within reach;with bed alarm set Nurse Communication: Mobility status  GO     Benito Mccreedy OTR/L [phone] 04/07/2013, 6:26 PM

## 2013-04-07 NOTE — Plan of Care (Signed)
Problem: Food- and Nutrition-Related Knowledge Deficit (NB-1.1) Goal: Nutrition education Formal process to instruct or train a patient/client in a skill or to impart knowledge to help patients/clients voluntarily manage or modify food choices and eating behavior to maintain or improve health. Outcome: Completed/Met Date Met:  04/07/13  RD consulted for nutrition education regarding diabetes.     Lab Results  Component Value Date    HGBA1C 15.6* 04/06/2013   Patient admitted with hyperosmolar nonketotic hyperglycemia. She reports that she received a booklet on diabetes from her doctor previously, but she admits that she hasn't really looked at it. She also reports that she had not been taking medication as instructed due to the high cost.   RD provided "Carbohydrate Counting for People with Diabetes" handout from the Academy of Nutrition and Dietetics. Discussed different food groups and their effects on blood sugar, emphasizing carbohydrate-containing foods. Provided list of carbohydrates and recommended serving sizes of common foods.  Discussed importance of controlled and consistent carbohydrate intake throughout the day. Provided examples of ways to balance meals/snacks and encouraged intake of high-fiber, whole grain complex carbohydrates. Teach back method used.  Expect good compliance.  Body mass index is 17.32 kg/(m^2). Pt meets criteria for underweight based on current BMI.  Current diet order is Carbohydrate Modified, patient is consuming approximately 75-100% of meals at this time. Labs and medications reviewed. No further nutrition interventions warranted at this time. RD contact information provided. If additional nutrition issues arise, please re-consult RD.  Larey Seat, RD, LDN Pager #: [phone] After-Hours Pager #: [phone]

## 2013-04-07 NOTE — Progress Notes (Signed)
[facility] PROGRESS NOTE  Carmen [name] [id number] DOB: 12-08-1933 DOA: 04/05/2013 PCP: Maximino Greenland, MD  Assessment/Plan: #1 hyperosmolar nonketotic hyperglycemia Questionable etiology. On admission patient was noted to have an anion gap of 16 however was not in acidosis. Patient states she's been compliant was with her medications. Cardiac enzymes negative x3. EKG which was done showed some T-wave inversions in the 21 aVL and BP and T wave inversions in V5 and V6 which were more pronounced than on prior EKG. 2-D echo with a normal EF. Chest x-ray negative for any acute infiltrates. Urine cultures pending. Patient is of the insulin drip and currently on Lantus. CBGs have ranged from 162- 330. Hemoglobin A1c is 15.1. Will increase Lantus to 20 units daily. Continue sliding scale insulin.   #2 generalized weakness Questionable etiology. May be secondary to problem #1. Chest x-ray is negative. Cardiac enzymes are negative x3. 2-D echo with a normal EF. Normal saline lock IV fluids. PT/OT.  #3 hypokalemia Likely secondary to problem #1. Replete. Follow.  #4 transaminitis Questionable etiology. Abdominal ultrasound is unremarkable. Acute hepatitis panel is negative. LFTs trending down. Continue to hold statin. Outpatient followup.  #5 acute on chronic kidney disease stage III On admission patient was noted to have an elevated creatinine of 1.7 from a baseline of approximately 1.2. Likely a prerenal azotemia secondary to dehydration in the setting of ARB and diuretics. ARB on hold.  Renal function improved and with hydration. Normal saline lock IV fluids. Abdominal ultrasound negative for hydronephrosis. Follow.  #6 hypertension Resumed home regimen of hydralazine, Imdur, Lopressor, spironolactone. Continue to hold Cozaar.  #7 hyperlipidemia Continue Lipitor.  #8 coronary artery disease status post permanent pacemaker Stable. Cardiac enzymes negative x3. 2-D echo with EF normal  and severe LVH. Patient denies any chest pain however presented with generalized weakness. Continue aspirin, spironolactone, metoprolol, aspirin, Plavix. Cozaar on hold secondary to problem #5.  #9 prophylaxis SCDs for DVT prophylaxis.   Code Status: Full Family Communication: Updated patient no family present. Disposition Plan: Home when medically stable.   Consultants:  None  Procedures:  Chest x-ray 04/06/2013  2-D echo 04/06/2013  Antibiotics:  None  HPI/Subjective: Patient sitting in chair without complaints. Patient denies any chest pain. No shortness of breath. Patient states she's feeling better.   Objective: Filed Vitals:   04/07/13 1000  BP: 141/55  Pulse: 66  Temp: 97.6 F (36.4 C)  Resp: 16    Intake/Output Summary (Last 24 hours) at 04/07/13 1049 Last data filed at 04/07/13 0700  Gross per 24 hour  Intake   1343 ml  Output    650 ml  Net    693 ml   Filed Weights   04/05/13 1457 04/05/13 1851 04/06/13 2147  Weight: 45.36 kg (100 lb) 42 kg (92 lb 9.5 oz) 45.8 kg (100 lb 15.5 oz)    Exam:   General:  Nad. Sitting in chair.  Cardiovascular: Regular rate rhythm  Respiratory: Clear to auscultation bilaterally. No wheezing, no crackles, no rhonchi.  Abdomen: Soft, nontender, nondistended, positive bowel sounds  Musculoskeletal: No clubbing cyanosis or edema.   Data Reviewed: Basic Metabolic Panel:  Recent Labs Lab 04/06/13 0130 04/06/13 0312 04/06/13 0700 04/06/13 0935 04/07/13 0352  NA 143 145 143 143 139  K 3.4* 3.7 3.7 3.6* 3.6*  CL 101 103 104 101 101  CO2 27 28 26 27 24   GLUCOSE 184* 110* 148* 210* 162*  BUN 39* 37* 36* 35* 29*  CREATININE 1.34* 1.33*  1.22* 1.29* 1.13*  CALCIUM 8.4 8.5 8.2* 8.4 8.0*  MG  --   --   --  1.5  --   PHOS  --   --   --  2.5  --    Liver Function Tests:  Recent Labs Lab 04/05/13 1356 04/06/13 0935 04/07/13 0352  AST 156* 126* 96*  ALT 193* 149* 122*  ALKPHOS 218* 177* 146*  BILITOT 0.3  0.4 0.3  PROT 7.0 6.3 5.6*  ALBUMIN 2.9* 2.6* 2.2*   No results found for this basename: LIPASE, AMYLASE,  in the last 168 hours No results found for this basename: AMMONIA,  in the last 168 hours CBC:  Recent Labs Lab 04/05/13 1356 04/05/13 2030 04/06/13 0935  WBC 4.7 3.8* 6.4  NEUTROABS 3.2  --  4.0  HGB 14.7 13.5 14.9  HCT 43.5 39.7 42.7  MCV 97.3 96.1 93.8  PLT 147* 124* 133*   Cardiac Enzymes:  Recent Labs Lab 04/06/13 0700 04/06/13 1230 04/06/13 2007  TROPONINI <0.30 <0.30 <0.30   BNP (last 3 results)  Recent Labs  08/12/12 1112 11/30/12 1509 12/11/12 1400  PROBNP 2540.0* 10923.0* 3706.0*   CBG:  Recent Labs Lab 04/06/13 0742 04/06/13 1228 04/06/13 1432 04/06/13 1805 04/06/13 2052  GLUCAP 139* 404* 358* 324* 275*    Recent Results (from the past 240 hour(s))  MRSA PCR SCREENING     Status: None   Collection Time    04/05/13  7:13 PM      Result Value Range Status   MRSA by PCR NEGATIVE  NEGATIVE Final   Comment:            The GeneXpert MRSA Assay (FDA     approved for NASAL specimens     only), is one component of a     comprehensive MRSA colonization     surveillance program. It is not     intended to diagnose MRSA     infection nor to guide or     monitor treatment for     MRSA infections.     Studies: Dg Chest 2 View  04/06/2013   CLINICAL DATA:  Hyperglycemia and weakness.  EXAM: CHEST  2 VIEW  COMPARISON:  12/05/2012 and 08/12/2012 as well as chest CT 08/12/2012  FINDINGS: Left-sided pacemaker is unchanged. Lungs are adequately inflated with a stable 4 mm dense nodule over the left midlung compatible with known calcified granuloma. No focal airspace consolidation or effusion. Cardiomediastinal silhouette and remainder of the exam is unchanged.  IMPRESSION: No active cardiopulmonary disease.   Electronically Signed   By: Marin Olp M.D.   On: 04/06/2013 15:35   US Abdomen Complete  04/06/2013   CLINICAL DATA:  Abnormal liver  function tests.  EXAM: ULTRASOUND ABDOMEN COMPLETE  COMPARISON:  Abdominal ultrasound 08/12/2012 and CT abdomen and pelvis 04/18/2012.  FINDINGS: Gallbladder:  No gallstones or wall thickening visualized. No sonographic Murphy sign noted.  Common bile duct:  Diameter: 0.7 cm  Liver:  A 1.0 cm in diameter hyperechoic lesion is identified in the right hepatic lobe as seen on the prior ultrasound. It likely represents a hemangioma. The liver is otherwise unremarkable.  IVC:  No abnormality visualized.  Pancreas:  Not visualized.  Spleen:  Size and appearance within normal limits.  Right Kidney:  Length: 9.9 cm. 1.8 cm simple cyst in the upper pole is noted. No stone or hydronephrosis. Lower pole is not well seen.  Left Kidney:  Length: 9.9 cm. 0.8 cm  cyst lower pole is noted. Echogenicity within normal limits. No mass or hydronephrosis visualized.  Abdominal aorta:  No aneurysm visualized.  Other findings:  None.  IMPRESSION: No acute finding or finding to explain elevated liver function tests. 1 cm in diameter hyperechoic lesion in the right hepatic lobe is unchanged and most consistent with a hemangioma.  Pancreas is not visualized on this study.   Electronically Signed   By: Inge Rise M.D.   On: 04/06/2013 15:32    Scheduled Meds: . aspirin EC  81 mg Oral Daily  . clopidogrel  75 mg Oral Q breakfast  . hydrALAZINE  50 mg Oral QID  . insulin aspart  0-9 Units Subcutaneous TID WC  . insulin aspart  3 Units Subcutaneous TID WC  . insulin glargine  15 Units Subcutaneous Daily  . isosorbide mononitrate  120 mg Oral Daily  . metoprolol  100 mg Oral BID  . sodium chloride  3 mL Intravenous Q12H  . spironolactone  25 mg Oral Daily  . Vitamin D (Ergocalciferol)  50,000 Units Oral Custom   Continuous Infusions:    Principal Problem:   Type 2 diabetes mellitus with hyperosmolar nonketotic hyperglycemia Active Problems:   Hypokalemia   DM (diabetes mellitus), type 2, uncontrolled, periph vascular  complic   Peripheral neuropathy   History of CVA (cerebrovascular accident) with associated mild right upper extremity hemiplegia   CKD (chronic kidney disease), stage III   HTN (hypertension)   Dyslipidemia   Abnormal LFTs   Weakness generalized   Dehydration   Transaminitis   Renal failure (ARF), acute on chronic    Time spent: [place] MD [facility] Pager [phone]. If 7PM-7AM, please contact night-coverage at [email or web address], password [facility] 04/07/2013, 10:49 AM  LOS: 2 days

## 2013-04-07 NOTE — Progress Notes (Signed)
Received patient from [facility].Patient is alert and oriented x4.Oriented to room and unit routines.Placed on falls precaution,bed alarm on.Call bell within reach.Patient aware to call for any assistance. Kaeleb Emond Joselita,RN

## 2013-04-08 LAB — BASIC METABOLIC PANEL
BUN: 24 mg/dL — ABNORMAL HIGH (ref 6–23)
CALCIUM: 8.1 mg/dL — AB (ref 8.4–10.5)
CO2: 24 meq/L (ref 19–32)
CREATININE: 1.1 mg/dL (ref 0.50–1.10)
Chloride: 102 mEq/L (ref 96–112)
GFR calc Af Amer: 55 mL/min — ABNORMAL LOW (ref >90)
GFR calc non Af Amer: 47 mL/min — ABNORMAL LOW (ref >90)
GLUCOSE: 52 mg/dL — AB (ref 70–99)
Potassium: 3.7 mEq/L (ref 3.7–5.3)
Sodium: 138 mEq/L (ref 137–147)

## 2013-04-08 LAB — GLUCOSE, CAPILLARY
Glucose-Capillary: 42 mg/dL — CL (ref 70–99)
Glucose-Capillary: 87 mg/dL (ref 70–99)
Glucose-Capillary: 178 mg/dL — ABNORMAL HIGH (ref 70–99)

## 2013-04-08 MED ORDER — POTASSIUM CHLORIDE CRYS ER 20 MEQ PO TBCR: 20.0000 meq | EXTENDED_RELEASE_TABLET | Freq: Every day | ORAL | Status: DC

## 2013-04-08 MED ORDER — ACETAMINOPHEN-CODEINE #3 300-30 MG PO TABS: 1.0000 | ORAL_TABLET | ORAL | Status: DC | PRN

## 2013-04-08 MED ORDER — INSULIN NPH ISOPHANE & REGULAR (70-30) 100 UNIT/ML ~~LOC~~ SUSP: 8.0000 [IU] | Freq: Two times a day (BID) | SUBCUTANEOUS | Status: DC

## 2013-04-08 MED ORDER — FUROSEMIDE 20 MG PO TABS: 20.0000 mg | ORAL_TABLET | Freq: Every day | ORAL | Status: DC

## 2013-04-08 NOTE — Discharge Instructions (Signed)
How and Where to Give Insulin Injections, Adult People with type 1 diabetes must take insulin since their bodies do not make it. People with type 2 diabetes may require insulin. There are many different types of insulin. The type of insulin you take will determine how many injections you will take and when to take the injections.  CHOOSING A SITE FOR INJECTION Insulin absorption varies from site to site. It is best to inject insulin within the same body region. Do not inject the insulin in the same spot for each injection. There are 4 main regions that can be used for injecting. The regions include the:  Abdomen (this is the preferred site).  Front and upper outer sides of thighs.  Back of upper arm.  Buttocks. USING A SYRINGE AND VIAL Drawing up insulin: single insulin dose 1. Wash your hands with soap and water. 2. Warm the medicine by gently rolling the bottle (vial) between your hands. Do not shake the vial. 3. Clean the top rubber part of the vial with an alcohol wipe. Be sure that the plastic pop-top has been removed on newer vials. 4. Remove the plastic cover from the needle on the syringe. Do not let the needle touch anything. 5. Pull the plunger back to draw air into the syringe. The air should be the same amount as the insulin dose. 6. Push the needle through the rubber on the top of the vial. Do not turn the vial over. 7. Push the plunger in all the way to put the air into the vial. 8. Leave the needle in the vial and turn the vial and syringe upside down. 9. Pull down slowly on the plunger, drawing the amount of insulin you need into the syringe. 10. Look for air bubbles in the syringe. You may need to push the plunger up and down 2 to 3 times to slowly get rid of any air bubbles in the syringe. 11. Pull back the plunger to get your correct dose. 12. Remove the needle from the vial. 13. Use an alcohol wipe to clean the area of the body to be injected. 14. Pinch up 1 inch of  skin and hold it. 15. Put the needle straight into the skin (90-degree angle). Put the needle in as far as it will go (to the hub). The needle may need to be injected at a 45-degree angle in small adults with little fat. 16. When the needle is in, you can let go of your skin. 17. Push the plunger down all the way to inject the insulin. 18. Pull the needle straight out of the skin. 19. Press the alcohol wipe over the spot where you gave your injection. Keep it there for a few seconds. Do not rub the area. 20. Do not put the plastic cover back on the needle. Drawing up insulin: mixing 2 insulins 1. Wash your hands with soap and water. 2. Roll the vial of "cloudy" insulin between your hands or rotate the vial from top to bottom to mix. 3. Clean the top of both vials with an alcohol wipe. Be sure that the plastic pop-top lid has been removed on newer vials. 4. Pull air into the syringe to equal the dose of "cloudy" insulin. 5. Stick the needle into the "cloudy" insulin vial and inject the air. Be sure to keep the vial upright. 6. Remove the needle from the "cloudy" insulin vial. 7. Pull air into the syringe to equal the dose of "clear" insulin. 8. Stick  the needle into the "clear" insulin vial and inject the air. 9. Leave the needle in the "clear" insulin vial and turn the vial upside down. 10. Pull down on the plunger and slowly draw into the syringe the number of units of "clear" insulin desired. 11. Look for air bubbles in the syringe. You may need to push the plunger up and down 2 to 3 times to slowly get rid of any air bubbles in the syringe. 12. Remove the needle from the "clear" insulin vial. 13. Stick the needle into the "cloudy" insulin vial. Do not inject any of the "clear" insulin into the "cloudy" vial. 14. Turn the "cloudy" vial upside down and pull the plunger down to the number of units that equals the total number of units of "clear" and "cloudy" insulins. 15. Remove the needle from  the "cloudy" insulin vial. 16. Use an alcohol wipe to clean the area of the body to be injected. 17. Put the needle straight into the skin (90-degree angle). Put the needle in as far as it will go (to the hub). The needle may need to be injected at a 45-degree angle in small adults with little fat. 18. When the needle is in, you can let go of your skin. 19. Push the plunger down all the way to inject the insulin. 20. Pull the needle straight out of the skin. 21. Press the alcohol wipe over the spot where you gave your injection. Keep it there for a few seconds. Do not rub the area. 22. Do not put the plastic cover back on the needle. USING INSULIN PENS 1. Wash your hands with soap and water. 2. If you are using the "cloudy" insulin, roll the pen between your palms several times or rotate the pen top to bottom several times. 3. Remove the insulin pen cap. 4. Clean the rubber stopper of the cartridge with an alcohol wipe. 5. Remove the protective paper tab from the disposable needle. 6. Screw the needle onto the pen. 7. Remove the outer plastic needle cover. 8. Remove the inner plastic needle cover. 9. Prime the insulin pen by turning the button (dial) to 2 units. Hold the pen with the needle pointing up, and push the dial on the opposite end until a drop of insulin appears at the needle tip. If no insulin appears, repeat this step. 10. Dial the number of units of insulin you will inject. 11. Use an alcohol wipe to clean the area of the body to be injected. 12. Pinch up 1 inch of skin and hold it. 13. Put the needle straight into the skin (90-degree angle). 14. Push the dial down to push the insulin into the fat tissue. 15. Count to 10 slowly. Then, remove the needle from the fat tissue. 16. Carefully replace the larger outer plastic needle cover over the needle and unscrew the capped needle. THROWING AWAY SUPPLIES  Discard used needles in a puncture proof sharps disposal container. Follow  disposal regulations for the area where you live.  Vials and empty disposable pens may be thrown away in the regular trash. Document Released: 05/15/2003 Document Revised: 05/17/2011 Document Reviewed: 08/01/2012 [facility] Patient Information 2014 [place], [place]. Diabetes Meal Planning Guide The diabetes meal planning guide is a tool to help you plan your meals and snacks. It is important for people with diabetes to manage their blood glucose (sugar) levels. Choosing the right foods and the right amounts throughout your day will help control your blood glucose. Eating right can  even help you improve your blood pressure and reach or maintain a healthy weight. CARBOHYDRATE COUNTING MADE EASY When you eat carbohydrates, they turn to sugar. This raises your blood glucose level. Counting carbohydrates can help you control this level so you feel better. When you plan your meals by counting carbohydrates, you can have more flexibility in what you eat and balance your medicine with your food intake. Carbohydrate counting simply means adding up the total amount of carbohydrate grams in your meals and snacks. Try to eat about the same amount at each meal. Foods with carbohydrates are listed below. Each portion below is 1 carbohydrate serving or 15 grams of carbohydrates. Ask your dietician how many grams of carbohydrates you should eat at each meal or snack. Grains and Starches  1 slice bread.   English muffin or hotdog/hamburger bun.   cup cold cereal (unsweetened).   cup cooked pasta or rice.   cup starchy vegetables (corn, potatoes, peas, beans, winter squash).  1 tortilla (6 inches).   bagel.  1 waffle or pancake (size of a CD).   cup cooked cereal.  4 to 6 small crackers. *Whole grain is recommended. Fruit  1 cup fresh unsweetened berries, melon, papaya, pineapple.  1 small fresh fruit.   banana or mango.   cup fruit juice (4 oz unsweetened).   cup canned fruit in natural  juice or water.  2 tbs dried fruit.  12 to 15 grapes or cherries. Milk and Yogurt  1 cup fat-free or 1% milk.  1 cup soy milk.  6 oz light yogurt with sugar-free sweetener.  6 oz low-fat soy yogurt.  6 oz plain yogurt. Vegetables  1 cup raw or  cup cooked is counted as 0 carbohydrates or a "free" food.  If you eat 3 or more servings at 1 meal, count them as 1 carbohydrate serving. Other Carbohydrates   oz chips or pretzels.   cup ice cream or frozen yogurt.   cup sherbet or sorbet.  2 inch square cake, no frosting.  1 tbs honey, sugar, jam, jelly, or syrup.  2 small cookies.  3 squares of graham crackers.  3 cups popcorn.  6 crackers.  1 cup broth-based soup.  Count 1 cup casserole or other mixed foods as 2 carbohydrate servings.  Foods with less than 20 calories in a serving may be counted as 0 carbohydrates or a "free" food. You may want to purchase a book or computer software that lists the carbohydrate gram counts of different foods. In addition, the nutrition facts panel on the labels of the foods you eat are a good source of this information. The label will tell you how big the serving size is and the total number of carbohydrate grams you will be eating per serving. Divide this number by 15 to obtain the number of carbohydrate servings in a portion. Remember, 1 carbohydrate serving equals 15 grams of carbohydrate. SERVING SIZES Measuring foods and serving sizes helps you make sure you are getting the right amount of food. The list below tells how big or small some common serving sizes are.  1 oz.........4 stacked dice.  3 oz........Marland KitchenDeck of cards.  1 tsp.......Marland KitchenTip of little finger.  1 tbs......Marland KitchenMarland KitchenThumb.  2 tbs.......Marland KitchenGolf ball.   cup......Marland KitchenHalf of a fist.  1 cup.......Marland KitchenA fist. SAMPLE DIABETES MEAL PLAN Below is a sample meal plan that includes foods from the grain and starches, dairy, vegetable, fruit, and meat groups. A dietician can  individualize a meal plan to fit  your calorie needs and tell you the number of servings needed from each food group. However, controlling the total amount of carbohydrates in your meal or snack is more important than making sure you include all of the food groups at every meal. You may interchange carbohydrate containing foods (dairy, starches, and fruits). The meal plan below is an example of a 2000 calorie diet using carbohydrate counting. This meal plan has 17 carbohydrate servings. Breakfast  1 cup oatmeal (2 carb servings).   cup light yogurt (1 carb serving).  1 cup blueberries (1 carb serving).   cup almonds. Snack  1 large apple (2 carb servings).  1 low-fat string cheese stick. Lunch  Chicken breast salad.  1 cup spinach.   cup chopped tomatoes.  2 oz chicken breast, sliced.  2 tbs low-fat [place] dressing.  12 whole-wheat crackers (2 carb servings).  12 to 15 grapes (1 carb serving).  1 cup low-fat milk (1 carb serving). Snack  1 cup carrots.   cup hummus (1 carb serving). Dinner  3 oz broiled salmon.  1 cup brown rice (3 carb servings). Snack  1  cups steamed broccoli (1 carb serving) drizzled with 1 tsp olive oil and lemon juice.  1 cup light pudding (2 carb servings). DIABETES MEAL PLANNING WORKSHEET Your dietician can use this worksheet to help you decide how many servings of foods and what types of foods are right for you.  BREAKFAST Food Group and Servings / Carb Servings Grain/Starches __________________________________ Dairy __________________________________________ Vegetable ______________________________________ Fruit ___________________________________________ Meat __________________________________________ Fat ____________________________________________ LUNCH Food Group and Servings / Carb Servings Grain/Starches ___________________________________ Dairy ___________________________________________ Fruit  ____________________________________________ Meat ___________________________________________ Fat _____________________________________________ Wonda Cheng Food Group and Servings / Carb Servings Grain/Starches ___________________________________ Dairy ___________________________________________ Fruit ____________________________________________ Meat ___________________________________________ Fat _____________________________________________ SNACKS Food Group and Servings / Carb Servings Grain/Starches ___________________________________ Dairy ___________________________________________ Vegetable _______________________________________ Fruit ____________________________________________ Meat ___________________________________________ Fat _____________________________________________ DAILY TOTALS Starches _________________________ Vegetable ________________________ Fruit ____________________________ Dairy ____________________________ Meat ____________________________ Fat ______________________________ Document Released: 11/19/2004 Document Revised: 05/17/2011 Document Reviewed: 09/30/2008 [employer] Patient Information 2014 [employer]. Monitoring for Diabetes There are two blood tests that help you monitor and manage your diabetes. These include:  An A1c (hemoglobin A1c) test.  This test is an average of your glucose (or blood sugar) control over the past 3 months.  This is recommended as a way for you and your caregiver to understand how well your glucose levels are controlled on the average.  Your A1c goal will be determined by your caregiver, but it is usually best if it is less than 6.5% to 7.0%.  Glucose (sugar) attaches itself to red blood cells. The amount of glucose then can then be measured. The amount of glucose on the cells depends on how high your blood glucose has been.  SMBG test (self-monitoring blood glucose).  Using a blood glucose monitor (meter) to do SMBG  testing is an easy way to monitor the amount of glucose in your blood and can help you improve your control. The monitor will tell you what your blood glucose is at that very moment. Every person with diabetes should have a blood glucose monitor and know how to use it. The better you control your blood sugar on a daily basis, the better your A1c levels will be. HOW OFTEN SHOULD I HAVE AN A1C LEVEL?  Every 3 months if your diabetes is not well controlled or if therapy has changed.  Every 6 months if you are meeting your treatment goals. HOW OFTEN SHOULD  I DO SMBG TESTING?  Your caregiver will recommend how often you should test. Testing times are based on the kind of medicine you take, type of diabetes you have, and your blood glucose control. Testing times can include:  Type 1 diabetes: test 3 or 4 times a day or as directed.  Type 2 diabetes and if you are taking insulin and diabetes pills: test 3 or 4 times a day or as directed.  If you are taking diabetes pills only and not reaching your target A1c: test 2 to 4 times a day or as directed.  If you are taking diabetes pills and are controlling your diabetes well with diet and exercise, your caregiver will help you decide what is appropriate. WHAT TIME OF DAY SHOULD I TEST?  The best time of day to test your blood glucose depends on medications, mealtimes, exercise, and blood glucose control. It is best to test at different times because this will help you know how you are doing throughout the day. Your caregiver will help you decide what is best. WHAT SHOULD MY BLOOD GLUCOSE BE? Blood glucose target goals may vary depending on each persons needs, whether they have type 1 or type 2 diabetes or what medications they are taking. However, as a general rule, blood glucose should be:  Before meals   70-130 mg/dl.  After meals    ..less than 180 mg/dl. CHECK YOUR BLOOD GLUCOSE IF:  You have symptoms of low blood sugar (hypoglycemia), which may  include dizziness, shaking, sweating, chills and confusion.  You have symptoms of high blood sugar (hyperglycemia), which may include sleepiness, blurred vision, frequent urination and excessive thirst.  You are learning how meals, physical activity and medicine affect your blood glucose level. The more you learn about how various foods, your medications, and activities affect you, the better job you will do of taking care of yourself.  You have a job in which poor control could cause safety problems while driving or operating machinery. CHECK YOUR BLOOD SUGAR MORE FREQUENTLY:  If you have medication or dietary changes.  If you begin taking other kinds of medicines.  If you become sick or your level of stress increases. With an illness, your blood sugar may even be high without eating.  Before and after exercise. Follow your caregiver's testing recommendations during this time.  TO DISPOSE OF SHARPS: Each city or state may have different regulations. Check with your public works or Theatre manager.  Sharps containers can be purchased from pharmacies.  Place all used sharps in a container. You do not need to replace any protective covers over the needle or break the needle.  Sharps should be contained in a ridge, leakproof, puncture-resistant container.  Plastic detergent bottle.  Bleach bottle.  When container is almost full, add a solution that is 1 part laundry bleach and 9 parts tap water (it is okay to use undiluted bleach if you wish). You may want to wear gloves since bleach can damage tissue. Let the solution sit for 30 minutes.  Carefully pour all the liquid into the sanitary sewer. Be sure to prevent the sharps from falling out.  Once liquid is drained, reseal the container with lid and tape it shut with duct tape. This will prevent the cap from coming off.  Dispose of the container with your regular household trash and waste. It is a good idea to let your  trash hauler know that you will be disposing of sharps. Document Released: 02/25/2003  Document Revised: 11/17/2011 Document Reviewed: 08/26/2008 [facility] Patient Information 2014 [place], [place]. How and Where to Give Insulin Injections, Adult People with type 1 diabetes must take insulin since their bodies do not make it. People with type 2 diabetes may require insulin. There are many different types of insulin. The type of insulin you take will determine how many injections you will take and when to take the injections.  CHOOSING A SITE FOR INJECTION Insulin absorption varies from site to site. It is best to inject insulin within the same body region. Do not inject the insulin in the same spot for each injection. There are 4 main regions that can be used for injecting. The regions include the:  Abdomen (this is the preferred site).  Front and upper outer sides of thighs.  Back of upper arm.  Buttocks. USING A SYRINGE AND VIAL Drawing up insulin: single insulin dose 21. Wash your hands with soap and water. 22. Warm the medicine by gently rolling the bottle (vial) between your hands. Do not shake the vial. 23. Clean the top rubber part of the vial with an alcohol wipe. Be sure that the plastic pop-top has been removed on newer vials. 24. Remove the plastic cover from the needle on the syringe. Do not let the needle touch anything. 25. Pull the plunger back to draw air into the syringe. The air should be the same amount as the insulin dose. 26. Push the needle through the rubber on the top of the vial. Do not turn the vial over. 27. Push the plunger in all the way to put the air into the vial. 28. Leave the needle in the vial and turn the vial and syringe upside down. 29. Pull down slowly on the plunger, drawing the amount of insulin you need into the syringe. 30. Look for air bubbles in the syringe. You may need to push the plunger up and down 2 to 3 times to slowly get rid of any air  bubbles in the syringe. 31. Pull back the plunger to get your correct dose. 32. Remove the needle from the vial. 33. Use an alcohol wipe to clean the area of the body to be injected. 34. Pinch up 1 inch of skin and hold it. 35. Put the needle straight into the skin (90-degree angle). Put the needle in as far as it will go (to the hub). The needle may need to be injected at a 45-degree angle in small adults with little fat. 36. When the needle is in, you can let go of your skin. 37. Push the plunger down all the way to inject the insulin. 38. Pull the needle straight out of the skin. 39. Press the alcohol wipe over the spot where you gave your injection. Keep it there for a few seconds. Do not rub the area. 40. Do not put the plastic cover back on the needle. Drawing up insulin: mixing 2 insulins 23. Wash your hands with soap and water. 24. Roll the vial of "cloudy" insulin between your hands or rotate the vial from top to bottom to mix. 25. Clean the top of both vials with an alcohol wipe. Be sure that the plastic pop-top lid has been removed on newer vials. 26. Pull air into the syringe to equal the dose of "cloudy" insulin. 27. Stick the needle into the "cloudy" insulin vial and inject the air. Be sure to keep the vial upright. 28. Remove the needle from the "cloudy" insulin vial. 29. Pull air  into the syringe to equal the dose of "clear" insulin. 30. Stick the needle into the "clear" insulin vial and inject the air. 31. Leave the needle in the "clear" insulin vial and turn the vial upside down. 32. Pull down on the plunger and slowly draw into the syringe the number of units of "clear" insulin desired. 33. Look for air bubbles in the syringe. You may need to push the plunger up and down 2 to 3 times to slowly get rid of any air bubbles in the syringe. 34. Remove the needle from the "clear" insulin vial. 35. Stick the needle into the "cloudy" insulin vial. Do not inject any of the "clear"  insulin into the "cloudy" vial. 36. Turn the "cloudy" vial upside down and pull the plunger down to the number of units that equals the total number of units of "clear" and "cloudy" insulins. 37. Remove the needle from the "cloudy" insulin vial. 38. Use an alcohol wipe to clean the area of the body to be injected. 39. Put the needle straight into the skin (90-degree angle). Put the needle in as far as it will go (to the hub). The needle may need to be injected at a 45-degree angle in small adults with little fat. 40. When the needle is in, you can let go of your skin. 41. Push the plunger down all the way to inject the insulin. 42. Pull the needle straight out of the skin. 43. Press the alcohol wipe over the spot where you gave your injection. Keep it there for a few seconds. Do not rub the area. 44. Do not put the plastic cover back on the needle. USING INSULIN PENS 17. Wash your hands with soap and water. 18. If you are using the "cloudy" insulin, roll the pen between your palms several times or rotate the pen top to bottom several times. 19. Remove the insulin pen cap. 20. Clean the rubber stopper of the cartridge with an alcohol wipe. 21. Remove the protective paper tab from the disposable needle. 22. Screw the needle onto the pen. 23. Remove the outer plastic needle cover. 24. Remove the inner plastic needle cover. 25. Prime the insulin pen by turning the button (dial) to 2 units. Hold the pen with the needle pointing up, and push the dial on the opposite end until a drop of insulin appears at the needle tip. If no insulin appears, repeat this step. 26. Dial the number of units of insulin you will inject. 27. Use an alcohol wipe to clean the area of the body to be injected. 28. Pinch up 1 inch of skin and hold it. 29. Put the needle straight into the skin (90-degree angle). 30. Push the dial down to push the insulin into the fat tissue. 31. Count to 10 slowly. Then, remove the needle  from the fat tissue. 32. Carefully replace the larger outer plastic needle cover over the needle and unscrew the capped needle. THROWING AWAY SUPPLIES  Discard used needles in a puncture proof sharps disposal container. Follow disposal regulations for the area where you live.  Vials and empty disposable pens may be thrown away in the regular trash. Document Released: 05/15/2003 Document Revised: 05/17/2011 Document Reviewed: 08/01/2012 [facility] Patient Information 2014 [place], [place]. Diets for Diabetes, Food Labeling Look at food labels to help you decide how much of a product you can eat. You will want to check the amount of total carbohydrate in a serving to see how the food fits into your  meal plan. In the list of ingredients, the ingredient present in the largest amount by weight must be listed first, followed by the other ingredients in descending order. STANDARD OF IDENTITY Most products have a list of ingredients. However, foods that the Food and Drug Administration (FDA) has given a standard of identity do not need a list of ingredients. A standard of identity means that a food must contain certain ingredients if it is called a particular name. Examples are mayonnaise, peanut butter, ketchup, jelly, and cheese. LABELING TERMS There are many terms found on food labels. Some of these terms have specific definitions. Some terms are regulated by the FDA, and the FDA has clearly specified how they can be used. Others are not regulated or well-defined and can be misleading and confusing. SPECIFICALLY DEFINED TERMS Nutritive Sweetener.  A sweetener that contains calories,such as table sugar or honey. Nonnutritive Sweetener.  A sweetener with few or no calories,such as saccharin, aspartame, sucralose, and cyclamate. LABELING TERMS REGULATED BY THE FDA Free.  The product contains only a tiny or small amount of fat, cholesterol, sodium, sugar, or calories. For example, a "fat-free" product  will contain less than 0.5 g of fat per serving. Low.  A food described as "low" in fat, saturated fat, cholesterol, sodium, or calories could be eaten fairly often without exceeding dietary guidelines. For example, "low in fat" means no more than 3 g of fat per serving. Lean.  "Lean" and "extra lean" are U.S. Department of Agriculture Scientist, research (physical sciences)) terms for use on meat and poultry products. "Lean" means the product contains less than 10 g of fat, 4 g of saturated fat, and 95 mg of cholesterol per serving. "Lean" is not as low in fat as a product labeled "low." Extra Lean.  "Extra lean" means the product contains less than 5 g of fat, 2 g of saturated fat, and 95 mg of cholesterol per serving. While "extra lean" has less fat than "lean," it is still higher in fat than a product labeled "low." Reduced, Less, Fewer.  A diet product that contains 25% less of a nutrient or calories than the regular version. For example, hot dogs might be labeled "25% less fat than our regular hot dogs." Light/Lite.  A diet product that contains  fewer calories or  the fat of the original. For example, "light in sodium" means a product with  the usual sodium. More.  One serving contains at least 10% more of the daily value of a vitamin, mineral, or fiber than usual. Good Source Of.  One serving contains 10% to 19% of the daily value for a particular vitamin, mineral, or fiber. Excellent Source Of.  One serving contains 20% or more of the daily value for a particular nutrient. Other terms used might be "high in" or "rich in." Enriched or Fortified.  The product contains added vitamins, minerals, or protein. Nutrition labeling must be used on enriched or fortified foods. Imitation.  The product has been altered so that it is lower in protein, vitamins, or minerals than the usual food,such as imitation peanut butter. Total Fat.  The number listed is the total of all fat found in a serving of the product. Under  total fat, food labels must list saturated fat and trans fat, which are associated with raising bad cholesterol and an increased risk of heart blood vessel disease. Saturated Fat.  Mainly fats from animal-based sources. Some examples are red meat, cheese, cream, whole milk, and coconut oil. Trans Fat.  Found in some fried snack foods, packaged foods, and fried restaurant foods. It is recommended you eat as close to 0 g of trans fat as possible, since it raises bad cholesterol and lowers good cholesterol. Polyunsaturated and Monounsaturated Fats.  More healthful fats. These fats are from plant sources. Total Carbohydrate.  The number of carbohydrate grams in a serving of the product. Under total carbohydrate are listed the other carbohydrate sources, such as dietary fiber and sugars. Dietary Fiber.  A carbohydrate from plant sources. Sugars.  Sugars listed on the label contain all naturally occurring sugars as well as added sugars. LABELING TERMS NOT REGULATED BY THE FDA Sugarless.  Table sugar (sucrose) has not been added. However, the manufacturer may use another form of sugar in place of sucrose to sweeten the product. For example, sugar alcohols are used to sweeten foods. Sugar alcohols are a form of sugar but are not table sugar. If a product contains sugar alcohols in place of sucrose, it can still be labeled "sugarless." Low Salt, Salt-Free, Unsalted, No Salt, No Salt Added, Without Added Salt.  Food that is usually processed with salt has been made without salt. However, the food may contain sodium-containing additives, such as preservatives, leavening agents, or flavorings. Natural.  This term has no legal meaning. Organic.  Foods that are certified as organic have been inspected and approved by the USDA to ensure they are produced without pesticides, fertilizers containing synthetic ingredients, bioengineering, or ionizing radiation. Document Released: 02/25/2003 Document  Revised: 05/17/2011 Document Reviewed: 09/12/2008 [facility] Patient Information 2014 [place], [place].

## 2013-04-08 NOTE — Progress Notes (Signed)
   CARE MANAGEMENT NOTE 04/08/2013  Patient:  Carmen [name], Carmen [name]   Account Number:  [id number]  Date Initiated:  04/08/2013  Documentation initiated by:  [facility]  Subjective/Objective Assessment:   adm: .  Hyperglycemia  .  Weakness     Action/Plan:   discharge planning   Anticipated DC Date:  04/08/2013   Anticipated DC Plan:  [place]  CM consult      [facility] Choice  HOME HEALTH   Choice offered to / List presented to:  [name]        [place] arranged  HH-1 RN  [place]      [place].   Status of service:  Completed, signed off Medicare Important Message given?   (If response is "NO", the following Medicare IM given date fields will be blank) Date Medicare IM given:   Date Additional Medicare IM given:    Discharge Disposition:  [place]  Per UR Regulation:    If discussed at Long Length of Stay Meetings, dates discussed:    Comments:  04/08/13 CM spoke with pt in room for choice and pt stated she had [facility] in past and would like them again.  Pt also states she has all the equipment at home-no DME necessary. Address and contact number verified with pt and referral faxed to [facility] for HHRN(disease mgmt)/aide.  No other CM needs were communicated.  Mariane Masters, BSN, CM [phone].

## 2013-04-08 NOTE — Progress Notes (Signed)
Patient discharge Home per Md order with [place].  Discharge instructions reviewed with patient and family.  Copies of all forms given and explained. Patient/family voiced understanding of all instructions.  Discharge in no acute distress.

## 2013-04-08 NOTE — Discharge Summary (Signed)
Physician Discharge Summary  Carmen [name] [id number] DOB: June 04, 1933 DOA: 04/05/2013  PCP: Maximino Greenland, MD  Admit date: 04/05/2013 Discharge date: 04/08/2013  Time spent: 65 minutes  Recommendations for Outpatient Follow-up:  1. Followup with Maximino Greenland, MD in 1 week. On followup a comprehensive metabolic profile needs to be obtained to followup on patient's electrolytes renal function and LFTs. Patient's statin was discontinued on discharge secondary to transaminitis. Patient's oxygen has also been discontinued as patient had sats of 96-98% RA. Patient's insulin dose has been changed to 70/30    8 units twice daily and patient and will need close followup on this.  Discharge Diagnoses:  Principal Problem:   Type 2 diabetes mellitus with hyperosmolar nonketotic hyperglycemia Active Problems:   Hypokalemia   DM (diabetes mellitus), type 2, uncontrolled, periph vascular complic   Peripheral neuropathy   History of CVA (cerebrovascular accident) with associated mild right upper extremity hemiplegia   CKD (chronic kidney disease), stage III   HTN (hypertension)   Dyslipidemia   Abnormal LFTs   Weakness generalized   Dehydration   Transaminitis   Renal failure (ARF), acute on chronic   Discharge Condition: Stable and improved  Diet recommendation: Carb modified  Filed Weights   04/05/13 1851 04/06/13 2147 04/07/13 2059  Weight: 42 kg (92 lb 9.5 oz) 45.8 kg (100 lb 15.5 oz) 49.261 kg (108 lb 9.6 oz)    History of present illness:  80 year old female with past medical history of DM, HTN, severe PVD and stenting, CAD and pacemaker placement, renovascular hypertension with renal artery stent who presented to [facility] ED 04/05/2013 with complaints of weakness and per family report high sugars. Pt did not have reports of falls. No chest pain, shortness of breath, palpitations. No abdominal pain, nausea or vomiting. No blood in stool or urine. No fever or chills.  In ED, BP was 99/56,  HR 57, Tmax 98 F and oxygen saturation 93% on room air. Blood work revealed glucose of 813 and AG of 16 and she was started on insulin drip. Additional blood work revealed potassium of 3.6 which was repleted in IV fluids.Creatinine was 1.7 but most recent baseline 1.2 (in 12/2012).   Hospital Course:  #1 hyperosmolar nonketotic hyperglycemia  Questionable etiology. On admission patient was noted to have an anion gap of 16 however was not in acidosis. Patient states she's been compliant was with her medications. Cardiac enzymes negative x3. EKG which was done showed some T-wave inversions in the 21 aVL and BP and T wave inversions in V5 and V6 which were more pronounced than on prior EKG. 2-D echo with a normal EF. Chest x-ray negative for any acute infiltrates. Urine cultures pending. Patient was initially placed on insulin drip IV fluids and placed in the step down unit. Patient's blood sugars improved and she was transitioned off the insulin drip to subcutaneous Lantus. Patient's Lantus doses were adjusted for better glycemic control. Hemoglobin A1c which was obtained came back elevated at 15.1. Patient improved clinically IV fluids was subsequently discontinued. Patient will be discharged home on 70/30    8 units in the morning and 8 units at night. Patient will followup with PCP one week post discharge for further management of her diabetes.  #2 generalized weakness  Questionable etiology. May be secondary to problem #1. Chest x-ray is negative. Cardiac enzymes are negative x3. 2-D echo with a normal EF. Normal saline lock IV fluids. PT/OT. Patient improved clinically and was back to her baseline.  #  3 hypokalemia  Likely secondary to problem #1. Repleted. Follow.  #4 transaminitis  Questionable etiology. Abdominal ultrasound is unremarkable. Acute hepatitis panel is negative. LFTs trending down. Continue to hold statin. Outpatient followup.  #5 acute on chronic kidney disease stage III  On admission  patient was noted to have an elevated creatinine of 1.7 from a baseline of approximately 1.2. Likely a prerenal azotemia secondary to dehydration in the setting of ARB and diuretics. ARB on hold. Renal function improved and with hydration.  Abdominal ultrasound negative for hydronephrosis. Patient's renal function improved and was at baseline by day of discharge.  #6 hypertension  Resumed on home regimen of hydralazine, Imdur, Lopressor, spironolactone, and a lower dose of Lasix 20 mg daily. Patient's Cozaar has been discontinued her blood pressure is currently well-controlled, and her ejection fraction is nl. Patient will followup with PCP one week post discharge for further management. #7 hyperlipidemia  Patient's statin was held secondary to her transaminitis. Patient to followup with PCP as outpatient with repeat LFTs will need to be obtained.  #8 coronary artery disease status post permanent pacemaker  Stable. Cardiac enzymes negative x3. 2-D echo with EF normal and severe LVH. Patient denied any chest pain however presented with generalized weakness. Continued on aspirin, spironolactone, metoprolol, aspirin, Plavix. Cozaar was held secondary to problem #5.      Procedures: Chest x-ray 04/06/2013  2-D echo 04/06/2013   Consultations:  None  Discharge Exam: Filed Vitals:   04/08/13 1400  BP: 109/57  Pulse: 60  Temp:   Resp: 17    General: NAD Cardiovascular: RRR Respiratory: CTAB  Discharge Instructions      Discharge Orders   Future Orders Complete By Expires   Diet Carb Modified  As directed    Discharge instructions  As directed    Comments:     Follow up with Maximino Greenland, MD in 1 week.   Increase activity slowly  As directed        Medication List    STOP taking these medications       losartan 100 MG tablet  Commonly known as:  COZAAR     rosuvastatin 10 MG tablet  Commonly known as:  CRESTOR      TAKE these medications        acetaminophen-codeine 300-30 MG per tablet  Commonly known as:  TYLENOL #3  Take 1 tablet by mouth every 4 (four) hours as needed for moderate pain.     aspirin EC 81 MG tablet  Take 81 mg by mouth daily.     clopidogrel 75 MG tablet  Commonly known as:  PLAVIX  Take 75 mg by mouth daily with breakfast.     ergocalciferol 50000 UNITS capsule  Commonly known as:  VITAMIN D2  Take 50,000 Units by mouth 2 (two) times a week. Tuesday and friday     furosemide 20 MG tablet  Commonly known as:  LASIX  Take 1 tablet (20 mg total) by mouth daily.  Start taking on:  04/09/2013     hydrALAZINE 50 MG tablet  Commonly known as:  APRESOLINE  Take 50 mg by mouth 4 (four) times daily.     insulin NPH-regular Human (70-30) 100 UNIT/ML injection  Commonly known as:  NOVOLIN 70/30  Inject 8 Units into the skin 2 (two) times daily with a meal.     isosorbide mononitrate 120 MG 24 hr tablet  Commonly known as:  IMDUR  Take 1 tablet (120 mg total)  by mouth daily.     metoprolol 100 MG tablet  Commonly known as:  LOPRESSOR  Take 100 mg by mouth 2 (two) times daily.     potassium chloride SA 20 MEQ tablet  Commonly known as:  K-DUR,KLOR-CON  Take 1 tablet (20 mEq total) by mouth daily.     spironolactone 25 MG tablet  Commonly known as:  ALDACTONE  Take 1 tablet (25 mg total) by mouth daily.       Allergies  Allergen Reactions  . Norvasc [Amlodipine Besylate] Swelling  . Lasix [Furosemide]     Caused a problem with her kidneys. However, pt is taking daily at home.  . Peanut-Containing Drug Products Other (See Comments)    Cause my stomach to hurt   Follow-up Information   Follow up with Maximino Greenland, MD. Schedule an appointment as soon as possible for a visit in 1 week.   Specialty:  Internal Medicine   Contact information:   [address] [place] [address] [phone]       Follow up with [facility] (home health nurse and aide)     Contact information:   [address] [place] [address] [phone]        The results of significant diagnostics from this hospitalization (including imaging, microbiology, ancillary and laboratory) are listed below for reference.    Significant Diagnostic Studies: Dg Chest 2 View  04/06/2013   CLINICAL DATA:  Hyperglycemia and weakness.  EXAM: CHEST  2 VIEW  COMPARISON:  12/05/2012 and 08/12/2012 as well as chest CT 08/12/2012  FINDINGS: Left-sided pacemaker is unchanged. Lungs are adequately inflated with a stable 4 mm dense nodule over the left midlung compatible with known calcified granuloma. No focal airspace consolidation or effusion. Cardiomediastinal silhouette and remainder of the exam is unchanged.  IMPRESSION: No active cardiopulmonary disease.   Electronically Signed   By: Marin Olp M.D.   On: 04/06/2013 15:35   US Abdomen Complete  04/06/2013   CLINICAL DATA:  Abnormal liver function tests.  EXAM: ULTRASOUND ABDOMEN COMPLETE  COMPARISON:  Abdominal ultrasound 08/12/2012 and CT abdomen and pelvis 04/18/2012.  FINDINGS: Gallbladder:  No gallstones or wall thickening visualized. No sonographic Murphy sign noted.  Common bile duct:  Diameter: 0.7 cm  Liver:  A 1.0 cm in diameter hyperechoic lesion is identified in the right hepatic lobe as seen on the prior ultrasound. It likely represents a hemangioma. The liver is otherwise unremarkable.  IVC:  No abnormality visualized.  Pancreas:  Not visualized.  Spleen:  Size and appearance within normal limits.  Right Kidney:  Length: 9.9 cm. 1.8 cm simple cyst in the upper pole is noted. No stone or hydronephrosis. Lower pole is not well seen.  Left Kidney:  Length: 9.9 cm. 0.8 cm cyst lower pole is noted. Echogenicity within normal limits. No mass or hydronephrosis visualized.  Abdominal aorta:  No aneurysm visualized.  Other findings:  None.  IMPRESSION: No acute finding or finding to explain elevated liver function tests. 1 cm in  diameter hyperechoic lesion in the right hepatic lobe is unchanged and most consistent with a hemangioma.  Pancreas is not visualized on this study.   Electronically Signed   By: Inge Rise M.D.   On: 04/06/2013 15:32    Microbiology: Recent Results (from the past 240 hour(s))  MRSA PCR SCREENING     Status: None   Collection Time    04/05/13  7:13 PM  Result Value Range Status   MRSA by PCR NEGATIVE  NEGATIVE Final   Comment:            The GeneXpert MRSA Assay (FDA     approved for NASAL specimens     only), is one component of a     comprehensive MRSA colonization     surveillance program. It is not     intended to diagnose MRSA     infection nor to guide or     monitor treatment for     MRSA infections.  URINE CULTURE     Status: None   Collection Time    04/06/13  9:08 AM      Result Value Range Status   Specimen Description URINE, RANDOM   Final   Special Requests NONE   Final   Culture  Setup Time     Final   Value: 04/06/2013 14:09     Performed at [place]     Final   Value: 15,000 COLONIES/ML     Performed at [facility]   Culture     Final   Value: Multiple bacterial morphotypes present, none predominant. Suggest appropriate recollection if clinically indicated.     Performed at [facility]   Report Status 04/07/2013 FINAL   Final     Labs: Basic Metabolic Panel:  Recent Labs Lab 04/06/13 0312 04/06/13 0700 04/06/13 0935 04/07/13 0352 04/08/13 0600  NA 145 143 143 139 138  K 3.7 3.7 3.6* 3.6* 3.7  CL 103 104 101 101 102  CO2 28 26 27 24 24   GLUCOSE 110* 148* 210* 162* 52*  BUN 37* 36* 35* 29* 24*  CREATININE 1.33* 1.22* 1.29* 1.13* 1.10  CALCIUM 8.5 8.2* 8.4 8.0* 8.1*  MG  --   --  1.5  --   --   PHOS  --   --  2.5  --   --    Liver Function Tests:  Recent Labs Lab 04/05/13 1356 04/06/13 0935 04/07/13 0352  AST 156* 126* 96*  ALT 193* 149* 122*  ALKPHOS 218* 177* 146*  BILITOT 0.3  0.4 0.3  PROT 7.0 6.3 5.6*  ALBUMIN 2.9* 2.6* 2.2*   No results found for this basename: LIPASE, AMYLASE,  in the last 168 hours No results found for this basename: AMMONIA,  in the last 168 hours CBC:  Recent Labs Lab 04/05/13 1356 04/05/13 2030 04/06/13 0935  WBC 4.7 3.8* 6.4  NEUTROABS 3.2  --  4.0  HGB 14.7 13.5 14.9  HCT 43.5 39.7 42.7  MCV 97.3 96.1 93.8  PLT 147* 124* 133*   Cardiac Enzymes:  Recent Labs Lab 04/06/13 0700 04/06/13 1230 04/06/13 2007  TROPONINI <0.30 <0.30 <0.30   BNP: BNP (last 3 results)  Recent Labs  08/12/12 1112 11/30/12 1509 12/11/12 1400  PROBNP 2540.0* 10923.0* 3706.0*   CBG:  Recent Labs Lab 04/07/13 1620 04/07/13 2107 04/08/13 0735 04/08/13 0755 04/08/13 1204  GLUCAP 282* 131* 42* 87 178*       Signed:  THOMPSON,DANIEL MD [facility] 04/08/2013, 2:41 PM

## 2013-04-17 ENCOUNTER — Encounter (HOSPITAL_COMMUNITY): Payer: Self-pay | Admitting: Emergency Medicine

## 2013-04-17 ENCOUNTER — Emergency Department (HOSPITAL_COMMUNITY)
Admission: EM | Admit: 2013-04-17 | Discharge: 2013-04-17 | Disposition: A | Discharge status: Home / Self Care | Payer: Medicare Other | Attending: Emergency Medicine | Admitting: Emergency Medicine

## 2013-04-17 DIAGNOSIS — M129 Arthropathy, unspecified: Secondary | ICD-10-CM | POA: Insufficient documentation

## 2013-04-17 DIAGNOSIS — Z8669 Personal history of other diseases of the nervous system and sense organs: Secondary | ICD-10-CM | POA: Insufficient documentation

## 2013-04-17 DIAGNOSIS — Z794 Long term (current) use of insulin: Secondary | ICD-10-CM | POA: Insufficient documentation

## 2013-04-17 DIAGNOSIS — F17200 Nicotine dependence, unspecified, uncomplicated: Secondary | ICD-10-CM | POA: Insufficient documentation

## 2013-04-17 DIAGNOSIS — Z7982 Long term (current) use of aspirin: Secondary | ICD-10-CM | POA: Insufficient documentation

## 2013-04-17 DIAGNOSIS — Z9889 Other specified postprocedural states: Secondary | ICD-10-CM | POA: Insufficient documentation

## 2013-04-17 DIAGNOSIS — I150 Renovascular hypertension: Secondary | ICD-10-CM | POA: Insufficient documentation

## 2013-04-17 DIAGNOSIS — I2510 Atherosclerotic heart disease of native coronary artery without angina pectoris: Secondary | ICD-10-CM | POA: Insufficient documentation

## 2013-04-17 DIAGNOSIS — Z87448 Personal history of other diseases of urinary system: Secondary | ICD-10-CM | POA: Insufficient documentation

## 2013-04-17 DIAGNOSIS — Z8673 Personal history of transient ischemic attack (TIA), and cerebral infarction without residual deficits: Secondary | ICD-10-CM | POA: Insufficient documentation

## 2013-04-17 DIAGNOSIS — Z79899 Other long term (current) drug therapy: Secondary | ICD-10-CM | POA: Insufficient documentation

## 2013-04-17 DIAGNOSIS — E860 Dehydration: Secondary | ICD-10-CM | POA: Insufficient documentation

## 2013-04-17 DIAGNOSIS — Z7902 Long term (current) use of antithrombotics/antiplatelets: Secondary | ICD-10-CM | POA: Insufficient documentation

## 2013-04-17 DIAGNOSIS — Z8659 Personal history of other mental and behavioral disorders: Secondary | ICD-10-CM | POA: Insufficient documentation

## 2013-04-17 DIAGNOSIS — R739 Hyperglycemia, unspecified: Secondary | ICD-10-CM

## 2013-04-17 DIAGNOSIS — E119 Type 2 diabetes mellitus without complications: Secondary | ICD-10-CM | POA: Insufficient documentation

## 2013-04-17 LAB — URINALYSIS, ROUTINE W REFLEX MICROSCOPIC
BILIRUBIN URINE: NEGATIVE
Glucose, UA: >1000 mg/dL — AB
HGB URINE DIPSTICK: NEGATIVE
Ketones, ur: NEGATIVE mg/dL
Leukocytes, UA: NEGATIVE
NITRITE: NEGATIVE
PH: 5.5 (ref 5.0–8.0)
Protein, ur: 30 mg/dL — AB
SPECIFIC GRAVITY, URINE: 1.014 (ref 1.005–1.030)
Urobilinogen, UA: 0.2 mg/dL (ref 0.0–1.0)

## 2013-04-17 LAB — COMPREHENSIVE METABOLIC PANEL
ALT: 97 U/L — AB (ref 0–35)
AST: 60 U/L — ABNORMAL HIGH (ref 0–37)
Albumin: 2.9 g/dL — ABNORMAL LOW (ref 3.5–5.2)
Alkaline Phosphatase: 137 U/L — ABNORMAL HIGH (ref 39–117)
BUN: 42 mg/dL — ABNORMAL HIGH (ref 6–23)
CO2: 22 meq/L (ref 19–32)
Calcium: 8.7 mg/dL (ref 8.4–10.5)
Chloride: 96 mEq/L (ref 96–112)
Creatinine, Ser: 1.67 mg/dL — ABNORMAL HIGH (ref 0.50–1.10)
GFR calc Af Amer: 33 mL/min — ABNORMAL LOW (ref >90)
GFR calc non Af Amer: 28 mL/min — ABNORMAL LOW (ref >90)
Glucose, Bld: 481 mg/dL — ABNORMAL HIGH (ref 70–99)
Potassium: 4.4 mEq/L (ref 3.7–5.3)
SODIUM: 130 meq/L — AB (ref 137–147)
TOTAL PROTEIN: 6.6 g/dL (ref 6.0–8.3)
Total Bilirubin: 0.2 mg/dL — ABNORMAL LOW (ref 0.3–1.2)

## 2013-04-17 LAB — CBC
HCT: 34.3 % — ABNORMAL LOW (ref 36.0–46.0)
HEMOGLOBIN: 11.8 g/dL — AB (ref 12.0–15.0)
MCH: 32.7 pg (ref 26.0–34.0)
MCHC: 34.4 g/dL (ref 30.0–36.0)
MCV: 95 fL (ref 78.0–100.0)
PLATELETS: 196 10*3/uL (ref 150–400)
RBC: 3.61 MIL/uL — AB (ref 3.87–5.11)
RDW: 13.4 % (ref 11.5–15.5)
WBC: 4.1 10*3/uL (ref 4.0–10.5)

## 2013-04-17 LAB — GLUCOSE, CAPILLARY
GLUCOSE-CAPILLARY: 343 mg/dL — AB (ref 70–99)
Glucose-Capillary: 469 mg/dL — ABNORMAL HIGH (ref 70–99)

## 2013-04-17 LAB — URINE MICROSCOPIC-ADD ON

## 2013-04-17 LAB — CG4 I-STAT (LACTIC ACID): LACTIC ACID, VENOUS: 0.94 mmol/L (ref 0.5–2.2)

## 2013-04-17 MED ORDER — INSULIN ASPART PROT & ASPART (70-30 MIX) 100 UNIT/ML ~~LOC~~ SUSP: 8.0000 [IU] | Freq: Once | SUBCUTANEOUS | Status: DC

## 2013-04-17 MED ORDER — INSULIN ASPART PROT & ASPART (70-30 MIX) 100 UNIT/ML ~~LOC~~ SUSP
8.0000 [IU] | Freq: Once | SUBCUTANEOUS | Status: AC
  Administered 2013-04-17: 8 [IU] via SUBCUTANEOUS
  Filled 2013-04-17: qty 10

## 2013-04-17 NOTE — ED Notes (Signed)
Pt presents for evaluation of hyperglycemia, pt home health nurse found CBG to be elevated around noon, called PCP and they instructed to bring pt to ED.  Pt denies any complaints at present, family states she has had problems with high blood sugar in the past.  A&O X 4 at present, IV in place, NSR on monitor.

## 2013-04-17 NOTE — Discharge Instructions (Signed)

## 2013-04-17 NOTE — ED Notes (Signed)
Per pt and family sent here for hyperglycemia. sts she checked it earlier and was high after eating. sts she ate some chicken and rice. Pt denies N,V,D. sts chronic pain nothing acute. sts she feels okay.

## 2013-04-17 NOTE — ED Provider Notes (Signed)
CSN: [id number]     Arrival date & time 04/17/13  1701 History   First MD Initiated Contact with Patient 04/17/13 1842     Chief Complaint  Patient presents with  . Hyperglycemia     (Consider location/radiation/quality/duration/timing/severity/associated sxs/prior Treatment) HPI Comments: Patient is a 80 year old female with a past medical history diabetes, hypertension, CAD, stroke and PVD who presents to the emergency department with her granddaughter with hyperglycemia. Patient lives at home with her husband, home health nurse came out around noon and checked her blood sugar and stated it was "high". She ate chicken and rice prior to checking blood sugar. She has not had anything to eat since. Granddaughter was not notified until patient's husband called her a few hours later. Granddaughter then went to the house, picked up patient brought her directly to the emergency department. Currently patient is denying any symptoms. Denies abdominal pain, nausea, vomiting, fever, chills, diarrhea, lightheadedness, dizziness, dry mouth, urinary changes, vision changes,chest pain or shortness of breath. No recent changes in medications. She did not have her 5:00 PM dose of insulin today as she came to the ED.  Patient is a 80 y.o. female presenting with hyperglycemia. The history is provided by the patient and a relative.  Hyperglycemia   Past Medical History  Diagnosis Date  . Diabetes mellitus   . Hypertension   . Coronary artery disease   . Renal disorder   . Herniated lumbar intervertebral disc   . Cataract     cataract surgery scheduled for 03/31/11, 2 - left eye, 1 - right eye  . Renovascular hypertension      s/p left renal artery stent 12/2007.  S/P balloon angioplasty on 02/16/10 for ISR, BP was  controlled well since then.     . Claudication in peripheral vascular disease     02/16/10: Left CIA 9.0x28 Omnilink and REIA 8.0x40 seff expanding Zilver.   right subclavian artery stent  03/18/2008,  . S/P carotid endarterectomy      left carotid endarterectomy  1991; Stroke and TIA in 1999 with right sided weakness, now with residual right arm weakness  . Stroke     TIA  . Peripheral vascular disease   . Anxiety   . Arthritis    Past Surgical History  Procedure Laterality Date  . Appendectomy    . Abdominal hysterectomy    . Ureteral stent placement    . Carotid endarterectomy    . Laminectomy    . Back surgery    . Colonoscopy  07/30/2011    Procedure: COLONOSCOPY;  Surgeon: Inda Castle, MD;  Location: [place];  Service: Endoscopy;  Laterality: N/A;  gi bleed  . Esophagogastroduodenoscopy  07/30/2011    Procedure: ESOPHAGOGASTRODUODENOSCOPY (EGD);  Surgeon: Inda Castle, MD;  Location: [place];  Service: Endoscopy;  Laterality: N/A;  . Pacemaker insertion     Family History  Problem Relation Age of Onset  . Cancer Father    History  Substance Use Topics  . Smoking status: Current Every Day Smoker -- 0.50 packs/day for 65 years    Types: Cigarettes  . Smokeless tobacco: Current User  . Alcohol Use: No   OB History   Grav Para Term Preterm Abortions TAB SAB Ect Mult Living                 Review of Systems  All other systems reviewed and are negative.      Allergies  Norvasc; Lasix; and Peanut-containing drug products  Home Medications   Current Outpatient Rx  Name  Route  Sig  Dispense  Refill  . acetaminophen-codeine (TYLENOL #3) 300-30 MG per tablet   Oral   Take 1 tablet by mouth every 4 (four) hours as needed for moderate pain.   20 tablet   0   . aspirin EC 81 MG tablet   Oral   Take 81 mg by mouth daily.         . clopidogrel (PLAVIX) 75 MG tablet   Oral   Take 75 mg by mouth daily with breakfast.         . hydrALAZINE (APRESOLINE) 50 MG tablet   Oral   Take 50 mg by mouth 4 (four) times daily.         . insulin NPH-regular Human (NOVOLIN 70/30) (70-30) 100 UNIT/ML injection   Subcutaneous   Inject 8  Units into the skin 2 (two) times daily with a meal.   10 mL   11   . ergocalciferol (VITAMIN D2) 50000 UNITS capsule   Oral   Take 50,000 Units by mouth 2 (two) times a week. Tuesday and friday         . isosorbide mononitrate (IMDUR) 120 MG 24 hr tablet   Oral   Take 1 tablet (120 mg total) by mouth daily.   30 tablet   0   . metoprolol (LOPRESSOR) 100 MG tablet   Oral   Take 100 mg by mouth 2 (two) times daily.         . potassium chloride SA (K-DUR,KLOR-CON) 20 MEQ tablet   Oral   Take 1 tablet (20 mEq total) by mouth daily.   31 tablet   0   . spironolactone (ALDACTONE) 25 MG tablet   Oral   Take 1 tablet (25 mg total) by mouth daily.   30 tablet   0    BP 119/49  Pulse 59  Temp(Src) 97.9 F (36.6 C) (Oral)  Resp 14  Ht 5\' 4"  (1.626 m)  Wt 98 lb (44.453 kg)  BMI 16.81 kg/m2  SpO2 99% Physical Exam  Nursing note and vitals reviewed. Constitutional: She is oriented to person, place, and time. She appears well-developed and well-nourished. No distress.  HENT:  Head: Normocephalic and atraumatic.  Mouth/Throat: Oropharynx is clear and moist.  Eyes: Conjunctivae and EOM are normal. Pupils are equal, round, and reactive to light. No scleral icterus.  Neck: Normal range of motion. Neck supple.  Cardiovascular: Normal rate, regular rhythm, normal heart sounds and intact distal pulses.   Pulmonary/Chest: Effort normal and breath sounds normal.  Abdominal: Soft. Bowel sounds are normal. There is no tenderness.  Musculoskeletal: Normal range of motion. She exhibits no edema.  Neurological: She is alert and oriented to person, place, and time.  Skin: Skin is warm and dry. She is not diaphoretic.  Psychiatric: She has a normal mood and affect. Her behavior is normal.    ED Course  Procedures (including critical care time) Labs Review Labs Reviewed  GLUCOSE, CAPILLARY - Abnormal; Notable for the following:    Glucose-Capillary 469 (*)    All other components  within normal limits  CBC - Abnormal; Notable for the following:    RBC 3.61 (*)    Hemoglobin 11.8 (*)    HCT 34.3 (*)    All other components within normal limits  COMPREHENSIVE METABOLIC PANEL - Abnormal; Notable for the following:    Sodium 130 (*)    Glucose, Bld 481 (*)  BUN 42 (*)    Creatinine, Ser 1.67 (*)    Albumin 2.9 (*)    AST 60 (*)    ALT 97 (*)    Alkaline Phosphatase 137 (*)    Total Bilirubin 0.2 (*)    GFR calc non Af Amer 28 (*)    GFR calc Af Amer 33 (*)    All other components within normal limits  URINALYSIS, ROUTINE W REFLEX MICROSCOPIC - Abnormal; Notable for the following:    APPearance HAZY (*)    Glucose, UA >1000 (*)    Protein, ur 30 (*)    All other components within normal limits  GLUCOSE, CAPILLARY - Abnormal; Notable for the following:    Glucose-Capillary 343 (*)    All other components within normal limits  URINE MICROSCOPIC-ADD ON  CG4 I-STAT (LACTIC ACID)   Imaging Review No results found.  EKG Interpretation   None       MDM   Final diagnoses:  Hyperglycemia    Patient presenting with hyperglycemia, asymptomatic. She is well appearing and in no apparent distress, stable vital signs. Doubt DKA. Labs obtained in triage prior to patient being seen, glucose 481, creatinine slightly elevated compared to 10 days ago, sodium slightly decreased at 1:30. Possible dehydration. Anion gap 12. Plan to rehydrate the patient, monitor CBG, discharge home. Pt and granddaughter both state if avoiding admission is possible than they would prefer to go home. Case discussed with attending Dr. Jeneen Rinks who agrees with plan of care. 9:26 PM CBG decreased from 481 to 343 after giving IV fluids and evening dose of insulin. She remains asymptomatic. She is not in DKA. Regarding elevation of createnine, most likely from dehydration, will have PCP follow this. She is stable for discharge home. F/u with PCP. Close return precautions given to patient and  granddaughter who stated understanding of plan and are agreeable. Pt also seen by Dr. Jeneen Rinks.     Illene Labrador, PA-C 04/17/13 2128  Illene Labrador, PA-C 04/17/13 2144

## 2013-04-17 NOTE — ED Notes (Signed)
PT comfortable with d/c and f/u instructions. Pt contacted her daughter to pick her up. No prescriptions given

## 2013-04-17 NOTE — ED Notes (Signed)
Pt grandaugther Carmen [name], [phone]

## 2013-04-18 NOTE — ED Provider Notes (Signed)
I personally performed the services described in this documentation, which was scribed in my presence. The recorded information has been reviewed and is accurate.  He states examination. Patient seen and evaluated. She is awake alert. She's asymptomatic. Normal heart rate. Normal vital signs. Not acidotic. Blood sugar improving. I think she is perfectly appropriate for outpatient treatment.  Tanna Furry, MD 04/18/13 [phone]

## 2013-06-02 ENCOUNTER — Emergency Department (HOSPITAL_COMMUNITY): Payer: Medicare Other

## 2013-06-02 ENCOUNTER — Observation Stay (HOSPITAL_COMMUNITY)
Admission: EM | Admit: 2013-06-02 | Discharge: 2013-06-03 | Disposition: A | Discharge status: Home / Self Care | Payer: Medicare Other | Attending: Internal Medicine | Admitting: Internal Medicine

## 2013-06-02 ENCOUNTER — Observation Stay (HOSPITAL_COMMUNITY): Payer: Medicare Other

## 2013-06-02 ENCOUNTER — Encounter (HOSPITAL_COMMUNITY): Payer: Self-pay | Admitting: Emergency Medicine

## 2013-06-02 DIAGNOSIS — IMO0002 Reserved for concepts with insufficient information to code with codable children: Secondary | ICD-10-CM

## 2013-06-02 DIAGNOSIS — Z950 Presence of cardiac pacemaker: Secondary | ICD-10-CM | POA: Insufficient documentation

## 2013-06-02 DIAGNOSIS — F17200 Nicotine dependence, unspecified, uncomplicated: Secondary | ICD-10-CM | POA: Insufficient documentation

## 2013-06-02 DIAGNOSIS — I2510 Atherosclerotic heart disease of native coronary artery without angina pectoris: Secondary | ICD-10-CM | POA: Insufficient documentation

## 2013-06-02 DIAGNOSIS — I129 Hypertensive chronic kidney disease with stage 1 through stage 4 chronic kidney disease, or unspecified chronic kidney disease: Secondary | ICD-10-CM | POA: Insufficient documentation

## 2013-06-02 DIAGNOSIS — E1165 Type 2 diabetes mellitus with hyperglycemia: Secondary | ICD-10-CM

## 2013-06-02 DIAGNOSIS — N189 Chronic kidney disease, unspecified: Secondary | ICD-10-CM

## 2013-06-02 DIAGNOSIS — F411 Generalized anxiety disorder: Secondary | ICD-10-CM | POA: Insufficient documentation

## 2013-06-02 DIAGNOSIS — Z794 Long term (current) use of insulin: Secondary | ICD-10-CM | POA: Insufficient documentation

## 2013-06-02 DIAGNOSIS — I69959 Hemiplegia and hemiparesis following unspecified cerebrovascular disease affecting unspecified side: Secondary | ICD-10-CM | POA: Insufficient documentation

## 2013-06-02 DIAGNOSIS — IMO0001 Reserved for inherently not codable concepts without codable children: Secondary | ICD-10-CM

## 2013-06-02 DIAGNOSIS — R7989 Other specified abnormal findings of blood chemistry: Secondary | ICD-10-CM

## 2013-06-02 DIAGNOSIS — E785 Hyperlipidemia, unspecified: Secondary | ICD-10-CM

## 2013-06-02 DIAGNOSIS — G629 Polyneuropathy, unspecified: Secondary | ICD-10-CM

## 2013-06-02 DIAGNOSIS — E1100 Type 2 diabetes mellitus with hyperosmolarity without nonketotic hyperglycemic-hyperosmolar coma (NKHHC): Secondary | ICD-10-CM

## 2013-06-02 DIAGNOSIS — R531 Weakness: Secondary | ICD-10-CM

## 2013-06-02 DIAGNOSIS — M549 Dorsalgia, unspecified: Secondary | ICD-10-CM

## 2013-06-02 DIAGNOSIS — I150 Renovascular hypertension: Secondary | ICD-10-CM

## 2013-06-02 DIAGNOSIS — E876 Hypokalemia: Secondary | ICD-10-CM | POA: Insufficient documentation

## 2013-06-02 DIAGNOSIS — G8929 Other chronic pain: Secondary | ICD-10-CM

## 2013-06-02 DIAGNOSIS — M6281 Muscle weakness (generalized): Principal | ICD-10-CM | POA: Insufficient documentation

## 2013-06-02 DIAGNOSIS — N179 Acute kidney failure, unspecified: Secondary | ICD-10-CM

## 2013-06-02 DIAGNOSIS — Z8673 Personal history of transient ischemic attack (TIA), and cerebral infarction without residual deficits: Secondary | ICD-10-CM

## 2013-06-02 DIAGNOSIS — R739 Hyperglycemia, unspecified: Secondary | ICD-10-CM

## 2013-06-02 DIAGNOSIS — R945 Abnormal results of liver function studies: Secondary | ICD-10-CM

## 2013-06-02 DIAGNOSIS — E860 Dehydration: Secondary | ICD-10-CM

## 2013-06-02 DIAGNOSIS — Z720 Tobacco use: Secondary | ICD-10-CM

## 2013-06-02 DIAGNOSIS — Q283 Other malformations of cerebral vessels: Secondary | ICD-10-CM | POA: Insufficient documentation

## 2013-06-02 DIAGNOSIS — R7401 Elevation of levels of liver transaminase levels: Secondary | ICD-10-CM

## 2013-06-02 DIAGNOSIS — E119 Type 2 diabetes mellitus without complications: Secondary | ICD-10-CM | POA: Insufficient documentation

## 2013-06-02 DIAGNOSIS — R740 Nonspecific elevation of levels of transaminase and lactic acid dehydrogenase [LDH]: Secondary | ICD-10-CM

## 2013-06-02 DIAGNOSIS — E1151 Type 2 diabetes mellitus with diabetic peripheral angiopathy without gangrene: Secondary | ICD-10-CM

## 2013-06-02 DIAGNOSIS — I639 Cerebral infarction, unspecified: Secondary | ICD-10-CM

## 2013-06-02 DIAGNOSIS — Z7982 Long term (current) use of aspirin: Secondary | ICD-10-CM | POA: Insufficient documentation

## 2013-06-02 DIAGNOSIS — Z9889 Other specified postprocedural states: Secondary | ICD-10-CM | POA: Insufficient documentation

## 2013-06-02 DIAGNOSIS — I70219 Atherosclerosis of native arteries of extremities with intermittent claudication, unspecified extremity: Secondary | ICD-10-CM | POA: Insufficient documentation

## 2013-06-02 DIAGNOSIS — G609 Hereditary and idiopathic neuropathy, unspecified: Secondary | ICD-10-CM | POA: Insufficient documentation

## 2013-06-02 DIAGNOSIS — N183 Chronic kidney disease, stage 3 unspecified: Secondary | ICD-10-CM | POA: Insufficient documentation

## 2013-06-02 DIAGNOSIS — I10 Essential (primary) hypertension: Secondary | ICD-10-CM

## 2013-06-02 HISTORY — DX: Presence of cardiac pacemaker: Z95.0

## 2013-06-02 LAB — BASIC METABOLIC PANEL
BUN: 16 mg/dL (ref 6–23)
CALCIUM: 9 mg/dL (ref 8.4–10.5)
CO2: 28 mEq/L (ref 19–32)
Chloride: 98 mEq/L (ref 96–112)
Creatinine, Ser: 0.99 mg/dL (ref 0.50–1.10)
GFR, EST AFRICAN AMERICAN: 62 mL/min — AB (ref >90)
GFR, EST NON AFRICAN AMERICAN: 54 mL/min — AB (ref >90)
GLUCOSE: 379 mg/dL — AB (ref 70–99)
Potassium: 2.6 mEq/L — CL (ref 3.7–5.3)
Sodium: 139 mEq/L (ref 137–147)

## 2013-06-02 LAB — CREATININE, SERUM
Creatinine, Ser: 0.94 mg/dL (ref 0.50–1.10)
GFR calc non Af Amer: 57 mL/min — ABNORMAL LOW (ref >90)
GFR, EST AFRICAN AMERICAN: 66 mL/min — AB (ref >90)

## 2013-06-02 LAB — URINALYSIS, ROUTINE W REFLEX MICROSCOPIC
BILIRUBIN URINE: NEGATIVE
Glucose, UA: >1000 mg/dL — AB
HGB URINE DIPSTICK: NEGATIVE
KETONES UR: NEGATIVE mg/dL
Leukocytes, UA: NEGATIVE
Nitrite: NEGATIVE
Protein, ur: 100 mg/dL — AB
Specific Gravity, Urine: 1.018 (ref 1.005–1.030)
UROBILINOGEN UA: 0.2 mg/dL (ref 0.0–1.0)
pH: 6 (ref 5.0–8.0)

## 2013-06-02 LAB — CBC
HEMATOCRIT: 37.8 % (ref 36.0–46.0)
HEMATOCRIT: 36.4 % (ref 36.0–46.0)
HEMOGLOBIN: 13 g/dL (ref 12.0–15.0)
HEMOGLOBIN: 12.3 g/dL (ref 12.0–15.0)
MCH: 34.9 pg — AB (ref 26.0–34.0)
MCH: 35 pg — ABNORMAL HIGH (ref 26.0–34.0)
MCHC: 34.4 g/dL (ref 30.0–36.0)
MCHC: 33.8 g/dL (ref 30.0–36.0)
MCV: 101.6 fL — ABNORMAL HIGH (ref 78.0–100.0)
MCV: 103.7 fL — AB (ref 78.0–100.0)
PLATELETS: 120 10*3/uL — AB (ref 150–400)
Platelets: 125 10*3/uL — ABNORMAL LOW (ref 150–400)
RBC: 3.72 MIL/uL — ABNORMAL LOW (ref 3.87–5.11)
RBC: 3.51 MIL/uL — AB (ref 3.87–5.11)
RDW: 13.9 % (ref 11.5–15.5)
RDW: 14.2 % (ref 11.5–15.5)
WBC: 5.7 10*3/uL (ref 4.0–10.5)
WBC: 4.3 10*3/uL (ref 4.0–10.5)

## 2013-06-02 LAB — URINE MICROSCOPIC-ADD ON

## 2013-06-02 LAB — MAGNESIUM: Magnesium: 1.8 mg/dL (ref 1.5–2.5)

## 2013-06-02 LAB — CBG MONITORING, ED: Glucose-Capillary: 329 mg/dL — ABNORMAL HIGH (ref 70–99)

## 2013-06-02 LAB — TROPONIN I: Troponin I: <0.3 ng/mL (ref <0.30)

## 2013-06-02 MED ORDER — HYDROCODONE-ACETAMINOPHEN 5-325 MG PO TABS
1.0000 | ORAL_TABLET | Freq: Once | ORAL | Status: AC
  Administered 2013-06-02: 1 via ORAL
  Filled 2013-06-02: qty 1

## 2013-06-02 MED ORDER — ALPRAZOLAM 0.5 MG PO TABS: 0.5000 mg | ORAL_TABLET | Freq: Three times a day (TID) | ORAL | Status: DC | PRN

## 2013-06-02 MED ORDER — POTASSIUM CHLORIDE CRYS ER 20 MEQ PO TBCR
20.0000 meq | EXTENDED_RELEASE_TABLET | Freq: Once | ORAL | Status: AC
  Administered 2013-06-03: 20 meq via ORAL
  Filled 2013-06-02: qty 1

## 2013-06-02 MED ORDER — OMEGA-3-ACID ETHYL ESTERS 1 G PO CAPS
1.0000 g | ORAL_CAPSULE | Freq: Two times a day (BID) | ORAL | Status: DC
  Administered 2013-06-03 (×2): 1 g via ORAL
  Filled 2013-06-02 (×4): qty 1

## 2013-06-02 MED ORDER — ISOSORBIDE MONONITRATE ER 60 MG PO TB24
120.0000 mg | ORAL_TABLET | Freq: Every day | ORAL | Status: DC
  Administered 2013-06-03: 120 mg via ORAL
  Filled 2013-06-02: qty 2

## 2013-06-02 MED ORDER — HYDRALAZINE HCL 50 MG PO TABS
50.0000 mg | ORAL_TABLET | Freq: Four times a day (QID) | ORAL | Status: DC
  Administered 2013-06-02 – 2013-06-03 (×3): 50 mg via ORAL
  Filled 2013-06-02 (×5): qty 1

## 2013-06-02 MED ORDER — INSULIN ASPART PROT & ASPART (70-30 MIX) 100 UNIT/ML ~~LOC~~ SUSP: 8.0000 [IU] | Freq: Two times a day (BID) | SUBCUTANEOUS | Status: DC

## 2013-06-02 MED ORDER — METOPROLOL TARTRATE 100 MG PO TABS
100.0000 mg | ORAL_TABLET | Freq: Two times a day (BID) | ORAL | Status: DC
  Administered 2013-06-03 (×2): 100 mg via ORAL
  Filled 2013-06-02 (×3): qty 1

## 2013-06-02 MED ORDER — SPIRONOLACTONE 50 MG PO TABS
50.0000 mg | ORAL_TABLET | Freq: Every day | ORAL | Status: DC
  Administered 2013-06-03: 50 mg via ORAL
  Filled 2013-06-02: qty 1

## 2013-06-02 MED ORDER — POTASSIUM CHLORIDE CRYS ER 20 MEQ PO TBCR
20.0000 meq | EXTENDED_RELEASE_TABLET | Freq: Every day | ORAL | Status: DC
  Administered 2013-06-03: 20 meq via ORAL
  Filled 2013-06-02 (×3): qty 1

## 2013-06-02 MED ORDER — VITAMIN D (ERGOCALCIFEROL) 1.25 MG (50000 UNIT) PO CAPS: 50000.0000 [IU] | ORAL_CAPSULE | ORAL | Status: DC

## 2013-06-02 MED ORDER — POTASSIUM CHLORIDE 20 MEQ/15ML (10%) PO LIQD: 40.0000 meq | Freq: Once | ORAL | Status: DC

## 2013-06-02 MED ORDER — HEPARIN SODIUM (PORCINE) 5000 UNIT/ML IJ SOLN
5000.0000 [IU] | Freq: Three times a day (TID) | INTRAMUSCULAR | Status: DC
Start: 2013-06-02 — End: 2013-06-03
  Administered 2013-06-03 (×2): 5000 [IU] via SUBCUTANEOUS
  Filled 2013-06-02 (×4): qty 1

## 2013-06-02 MED ORDER — IOHEXOL 350 MG/ML SOLN
50.0000 mL | Freq: Once | INTRAVENOUS | Status: AC | PRN
  Administered 2013-06-02: 50 mL via INTRAVENOUS

## 2013-06-02 MED ORDER — SODIUM CHLORIDE 0.9 % IV SOLN
Freq: Once | INTRAVENOUS | Status: AC
  Administered 2013-06-02: 22:00:00 via INTRAVENOUS

## 2013-06-02 MED ORDER — POTASSIUM CHLORIDE 10 MEQ/100ML IV SOLN
10.0000 meq | Freq: Once | INTRAVENOUS | Status: AC
  Administered 2013-06-02: 10 meq via INTRAVENOUS
  Filled 2013-06-02: qty 100

## 2013-06-02 MED ORDER — POTASSIUM CHLORIDE CRYS ER 20 MEQ PO TBCR
40.0000 meq | EXTENDED_RELEASE_TABLET | Freq: Once | ORAL | Status: AC
  Administered 2013-06-02: 40 meq via ORAL
  Filled 2013-06-02: qty 2

## 2013-06-02 MED ORDER — INSULIN ASPART PROT & ASPART (70-30 MIX) 100 UNIT/ML ~~LOC~~ SUSP
8.0000 [IU] | Freq: Two times a day (BID) | SUBCUTANEOUS | Status: DC
  Administered 2013-06-02 – 2013-06-03 (×2): 8 [IU] via SUBCUTANEOUS
  Filled 2013-06-02: qty 10

## 2013-06-02 MED ORDER — ATORVASTATIN CALCIUM 20 MG PO TABS
20.0000 mg | ORAL_TABLET | Freq: Every day | ORAL | Status: DC
  Filled 2013-06-02: qty 1

## 2013-06-02 MED ORDER — SODIUM CHLORIDE 0.9 % IJ SOLN
3.0000 mL | Freq: Two times a day (BID) | INTRAMUSCULAR | Status: DC
  Administered 2013-06-03 (×2): 3 mL via INTRAVENOUS

## 2013-06-02 MED ORDER — ACETAMINOPHEN-CODEINE #3 300-30 MG PO TABS: 1.0000 | ORAL_TABLET | ORAL | Status: DC | PRN

## 2013-06-02 MED ORDER — INSULIN ASPART 100 UNIT/ML ~~LOC~~ SOLN
0.0000 [IU] | SUBCUTANEOUS | Status: DC
  Administered 2013-06-03: 2 [IU] via SUBCUTANEOUS
  Administered 2013-06-03: 12 [IU] via SUBCUTANEOUS

## 2013-06-02 MED ORDER — POTASSIUM CHLORIDE IN NACL 40-0.9 MEQ/L-% IV SOLN
INTRAVENOUS | Status: DC
  Administered 2013-06-03: 01:00:00 via INTRAVENOUS
  Filled 2013-06-02 (×3): qty 1000

## 2013-06-02 MED ORDER — SODIUM CHLORIDE 0.9 % IV BOLUS (SEPSIS)
1000.0000 mL | Freq: Once | INTRAVENOUS | Status: AC
  Administered 2013-06-02: 1000 mL via INTRAVENOUS

## 2013-06-02 MED ORDER — ASPIRIN EC 81 MG PO TBEC
81.0000 mg | DELAYED_RELEASE_TABLET | Freq: Every day | ORAL | Status: DC
  Administered 2013-06-03: 81 mg via ORAL
  Filled 2013-06-02: qty 1

## 2013-06-02 NOTE — ED Notes (Signed)
She states her back and legs have been hurting all week and shes felt so weak she can hardly walk. shes had no appetite. She is a&o, breathing easily

## 2013-06-02 NOTE — H&P (Addendum)
Hospitalist Admission History and Physical  Patient name: Carmen [name] Medical record number: [id number] Date of birth: 09-Jun-1933 Age: 80 y.o. Gender: female  Primary Care Provider: Maximino Greenland, MD  Chief Complaint: weakness, hypokalemia History of Present Illness:This is a 80 y.o. year old female with multiple medical problems including IDDM, HTN, stage 3 CKD,  peripheral vascular disease, pacemaker, recurrent hypokalemia presenting with weakness and hypokalemia. Patient is known to have been admitted January 29 to February 1 for hyperosmolar nonketotic hyperglycemia. Patient is known to have weakness, hypokalemia, acute and chronic renal sufficiency at that time. Per the patient and daughter, she was discontinued on Lasix in the hospital. However, was not instructed to restart Lasix per the patient. Patient states that she noticed worsening swelling at home and restarted on Lasix on her own. Patient did not restart oral potassium with this. Has been doing this for the past 4-6 weeks. Patient's had progressive weakness that is generalized over the past week. Less so we can assess him be worse in the right. No distal numbness or paresthesias. Patient denies any fevers chills. No shortness of breath, abdominal pain, diarrhea, dysuria. Patient does have a home health RN and does come help at home at times. Per the patient and daughter, and they have been getting conflicting exceptional medication use. Per the family, the patient was instructed not sick grandson today because her sugar was above 100. These instructions came from the nurse. The patient saw her PCP immediately after hospital discharge back in February. However has not seen her PCP since this point. Blood sugars have been somewhat variable at home. Ranging in the 100s to 2-300s. No fevers or chills. Has had weakness, feels like weakness has been somewhat greater on L sided. No numbness or paresthesias. No slurred speech.  In the ER, patient  with a noted potassium level of 2.6. Hemodynamically stable. Blood sugar 379. Borderline anion gap of 13. Urinalysis negative for infection. Chest x-ray negative for any focal infiltrate. Patient given oral as well as IV potassium. Patient Active Problem List   Diagnosis Date Noted  . Weakness generalized 04/06/2013  . Dehydration 04/06/2013  . Transaminitis 04/06/2013  . Renal failure (ARF), acute on chronic 04/06/2013  . Type 2 diabetes mellitus with hyperosmolar nonketotic hyperglycemia 04/05/2013  . CKD (chronic kidney disease), stage III 04/05/2013  . HTN (hypertension) 04/05/2013  . Dyslipidemia 04/05/2013  . Abnormal LFTs 04/05/2013  . CVA (cerebral infarction) 10/15/2011  . [employer] 10/11/2011  . History of CVA (cerebrovascular accident) with associated mild right upper extremity hemiplegia 07/30/2011  . Cardiac pacemaker for history of intermittent heart block 07/30/2011  . Retrovascular hypertension status post left renal artery stent 2009 and balloon angioplasty for in-stent restenosis 2011 04/05/2011  . Hypokalemia 03/28/2011  . Tobacco abuse 03/28/2011  . DM (diabetes mellitus), type 2, uncontrolled, periph vascular complic 57/32/2025  . Peripheral neuropathy 03/28/2011   Past Medical History: Past Medical History  Diagnosis Date  . Diabetes mellitus   . Hypertension   . Coronary artery disease   . Renal disorder   . Herniated lumbar intervertebral disc   . Cataract     cataract surgery scheduled for 03/31/11, 2 - left eye, 1 - right eye  . Renovascular hypertension      s/p left renal artery stent 12/2007.  S/P balloon angioplasty on 02/16/10 for ISR, BP was  controlled well since then.     . Claudication in peripheral vascular disease     02/16/10: Left  CIA 9.0x28 Omnilink and REIA 8.0x40 seff expanding Zilver.   right subclavian artery stent 03/18/2008,  . S/P carotid endarterectomy      left carotid endarterectomy  1991; Stroke and TIA in 1999  with right sided weakness, now with residual right arm weakness  . Stroke     TIA  . Peripheral vascular disease   . Anxiety   . Arthritis     Past Surgical History: Past Surgical History  Procedure Laterality Date  . Appendectomy    . Abdominal hysterectomy    . Ureteral stent placement    . Carotid endarterectomy    . Laminectomy    . Back surgery    . Colonoscopy  07/30/2011    Procedure: COLONOSCOPY;  Surgeon: Inda Castle, MD;  Location: [place];  Service: Endoscopy;  Laterality: N/A;  gi bleed  . Esophagogastroduodenoscopy  07/30/2011    Procedure: ESOPHAGOGASTRODUODENOSCOPY (EGD);  Surgeon: Inda Castle, MD;  Location: [place];  Service: Endoscopy;  Laterality: N/A;  . Pacemaker insertion      Social History: History   Social History  . Marital Status: Married    Spouse Name: N/A    Number of Children: N/A  . Years of Education: N/A   Social History Main Topics  . Smoking status: Current Every Day Smoker -- 0.50 packs/day for 65 years    Types: Cigarettes  . Smokeless tobacco: Current User  . Alcohol Use: No  . Drug Use: No  . Sexual Activity: None   Other Topics Concern  . None   Social History Narrative  . None    Family History: Family History  Problem Relation Age of Onset  . Cancer Father     Allergies: Allergies  Allergen Reactions  . Lasix [Furosemide] Other (See Comments)    Caused a problem with her kidneys. However, pt is taking daily at home.  . Norvasc [Amlodipine Besylate] Swelling  . Peanut-Containing Drug Products Other (See Comments)    Cause my stomach to hurt    Current Facility-Administered Medications  Medication Dose Route Frequency Provider Last Rate Last Dose  . 0.9 %  sodium chloride infusion   Intravenous Once Babette Relic, MD      . 0.9 % NaCl with KCl 40 mEq / L  infusion   Intravenous Continuous Shanda Howells, MD      . acetaminophen-codeine (TYLENOL #3) 300-30 MG per tablet 1 tablet  1 tablet Oral  Q4H PRN Shanda Howells, MD      . ALPRAZolam Duanne Moron) tablet 0.5 mg  0.5 mg Oral TID PRN Shanda Howells, MD      . aspirin EC tablet 81 mg  81 mg Oral Daily Shanda Howells, MD      . Derrill Memo ON 06/03/2013] atorvastatin (LIPITOR) tablet 20 mg  20 mg Oral q1800 Shanda Howells, MD      . Derrill Memo ON 06/04/2013] ergocalciferol (VITAMIN D2) capsule 50,000 Units  50,000 Units Oral Once per day on Mon Thu Shanda Howells, MD      . heparin injection 5,000 Units  5,000 Units Subcutaneous 3 times per day Shanda Howells, MD      . hydrALAZINE (APRESOLINE) tablet 50 mg  50 mg Oral QID Shanda Howells, MD      . insulin aspart protamine- aspart (NOVOLOG MIX 70/30) injection 8 Units  8 Units Subcutaneous BID WC Hannah Muthersbaugh, PA-C   8 Units at 06/02/13 2017  . [START ON 06/03/2013] insulin aspart protamine- aspart (NOVOLOG MIX 70/30)  injection 8 Units  8 Units Subcutaneous BID WC Shanda Howells, MD      . isosorbide mononitrate (IMDUR) 24 hr tablet 120 mg  120 mg Oral Daily Shanda Howells, MD      . metoprolol tartrate (LOPRESSOR) tablet 100 mg  100 mg Oral BID Shanda Howells, MD      . omega-3 acid ethyl esters (LOVAZA) capsule 1 g  1 g Oral BID Shanda Howells, MD      . potassium chloride 10 mEq in 100 mL IVPB  10 mEq Intravenous Once [employer], PA-C      . potassium chloride SA (K-DUR,KLOR-CON) CR tablet 20 mEq  20 mEq Oral Daily Shanda Howells, MD      . sodium chloride 0.9 % injection 3 mL  3 mL Intravenous Q12H Shanda Howells, MD      . spironolactone (ALDACTONE) tablet 50 mg  50 mg Oral Daily Shanda Howells, MD       Current Outpatient Prescriptions  Medication Sig Dispense Refill  . acetaminophen-codeine (TYLENOL #3) 300-30 MG per tablet Take 1 tablet by mouth every 4 (four) hours as needed for moderate pain.  20 tablet  0  . ALPRAZolam (XANAX) 0.5 MG tablet Take 0.5 mg by mouth 3 (three) times daily as needed for anxiety.       Marland Kitchen aspirin EC 81 MG tablet Take 81 mg by mouth daily.      . ergocalciferol  (VITAMIN D2) 50000 UNITS capsule Take 50,000 Units by mouth 2 (two) times a week. Tuesday and friday      . furosemide (LASIX) 40 MG tablet Take 40 mg by mouth 2 (two) times daily.      . hydrALAZINE (APRESOLINE) 50 MG tablet Take 50 mg by mouth 4 (four) times daily.      . hydrochlorothiazide (MICROZIDE) 12.5 MG capsule Take 12.5 mg by mouth daily.      . insulin NPH-regular Human (NOVOLIN 70/30) (70-30) 100 UNIT/ML injection Inject 8 Units into the skin 2 (two) times daily with a meal.  10 mL  11  . isosorbide mononitrate (IMDUR) 120 MG 24 hr tablet Take 1 tablet (120 mg total) by mouth daily.  30 tablet  0  . LOVAZA 1 G capsule Take 1 g by mouth 2 (two) times daily.       . metoprolol (LOPRESSOR) 100 MG tablet Take 100 mg by mouth 2 (two) times daily.      . rosuvastatin (CRESTOR) 10 MG tablet Take 10 mg by mouth daily.      Marland Kitchen spironolactone (ALDACTONE) 50 MG tablet Take 50 mg by mouth daily.      . potassium chloride SA (K-DUR,KLOR-CON) 20 MEQ tablet Take 1 tablet (20 mEq total) by mouth daily.  31 tablet  0  . [DISCONTINUED] aliskiren (TEKTURNA) 150 MG tablet Take 150 mg by mouth daily.      . [DISCONTINUED] gabapentin (NEURONTIN) 100 MG capsule Take 1 capsule (100 mg total) by mouth 3 (three) times daily.  90 capsule  0   Review Of Systems: 12 point ROS negative except as noted above in HPI.  Physical Exam: Filed Vitals:   06/02/13 1719  BP: 191/85  Pulse:   Temp:   Resp: 18    General: alert and cooperative HEENT: PERRLA and extra ocular movement intact Heart: S1, S2 normal, no murmur, rub or gallop, regular rate and rhythm Lungs: clear to auscultation, no wheezes or rales and unlabored breathing Abdomen: abdomen is soft without significant  tenderness, masses, organomegaly or guarding. No sacral ulcers.  Extremities: extremities normal, atraumatic, no cyanosis or edema Skin:no rashes Neurology: normal without focal findings  Labs and Imaging: Lab Results  Component Value  Date/Time   NA 139 06/02/2013  4:10 PM   K 2.6* 06/02/2013  4:10 PM   CL 98 06/02/2013  4:10 PM   CO2 28 06/02/2013  4:10 PM   BUN 16 06/02/2013  4:10 PM   CREATININE 0.99 06/02/2013  4:10 PM   GLUCOSE 379* 06/02/2013  4:10 PM   Lab Results  Component Value Date   WBC 5.7 06/02/2013   HGB 13.0 06/02/2013   HCT 37.8 06/02/2013   MCV 101.6* 06/02/2013   PLT 125* 06/02/2013   Urinalysis    Component Value Date/Time   COLORURINE YELLOW 06/02/2013 1945   APPEARANCEUR CLEAR 06/02/2013 1945   LABSPEC 1.018 06/02/2013 1945   PHURINE 6.0 06/02/2013 1945   GLUCOSEU >1000* 06/02/2013 1945   HGBUR NEGATIVE 06/02/2013 1945   BILIRUBINUR NEGATIVE 06/02/2013 1945   KETONESUR NEGATIVE 06/02/2013 1945   PROTEINUR 100* 06/02/2013 1945   UROBILINOGEN 0.2 06/02/2013 1945   NITRITE NEGATIVE 06/02/2013 1945   LEUKOCYTESUR NEGATIVE 06/02/2013 1945       Dg Chest 2 View  06/02/2013   CLINICAL DATA:  Weakness.  EXAM: CHEST  2 VIEW  COMPARISON:  04/06/2013  FINDINGS: Lung base opacity most notable on the right is likely atelectasis. Lungs are otherwise clear. The cardiac silhouette is normal in size. Normal mediastinal and hilar contours. Left anterior chest wall sequential pacemaker is stable in well positioned. There is a short stent projecting along the right superior mediastinum. Bony thorax is demineralized.  IMPRESSION: No acute cardiopulmonary disease.   Electronically Signed   By: Lajean Manes M.D.   On: 06/02/2013 18:01     Assessment and Plan: NASHAY BRICKLEY is a 80 y.o. year old female presenting with weakness, hypokalemia, hyperglycemia  Weakness: likely secondary to hypokalemia. No signs of infection currently. Mildly dry on exam. KCl runs. IVF w/ K. Check mag level. Anticipate recalcitrant hypokalemia as hyperglycemia is corrected. Serial BMETs. Gentle hydration. Hold diuretics. Also check vitamin D level. Noted medication miscompliance. Unclear if this is nidus for prior issues with K. Will need close  follow up with PCP outpt to follow up on this. Pt with noted ? Predominant L sided weakness. No focal neuro deficits on exam, though prior hx/o TIA and active smoker. Will obtain CT angio head to further eval (MRI contraindicated given pacemaker). Continue baby ASA. Consider neuro consult if weakness persist despite k correction.   Hyperglycemia: Borderline AG @ 13. Will place on glucose stabilizer. Check A1C. Watch sugars closely.   HTN/CAD/PVD: elevated BP. Continue home meds. Hold diuretics. Titrate regimen as indicated. Consider vascular c/s if BPs persistently elevated despite treatment given baseline renovascular disease. Tele bed. No active CP.    FEN/GI: heart healthy, carb modified diet.  Prophylaxis: sub q heparin  Disposition: pending further evaluation. Full code.  Code Status:Full Code        Shanda Howells MD  Pager: [phone]

## 2013-06-02 NOTE — ED Provider Notes (Signed)
Medical screening examination/treatment/procedure(s) were performed by non-physician practitioner and as supervising physician I was immediately available for consultation/collaboration.   Dot Lanes, MD 06/02/13 2030

## 2013-06-02 NOTE — ED Notes (Signed)
Pt eating dinner at this time with no complaints.

## 2013-06-02 NOTE — ED Notes (Signed)
Pt given Turkey sandwich  

## 2013-06-02 NOTE — ED Notes (Signed)
Critical potassium level called from lab.  Jarrett Soho Muthersbaugh PA made aware.

## 2013-06-02 NOTE — ED Provider Notes (Signed)
CSN: [id number]     Arrival date & time 06/02/13  1601 History   First MD Initiated Contact with Patient 06/02/13 1618     Chief Complaint  Patient presents with  . Leg Pain     (Consider location/radiation/quality/duration/timing/severity/associated sxs/prior Treatment) The history is provided by the patient, a relative and medical records. No language interpreter was used.    Carmen [name] is a 80 y.o. female  with a hx of insulin dependent diabetes, hypertension, CAD, stroke and PVD presents to the Emergency Department complaining of gradual, persistent, progressively worsening generalized weakness and decreased appetite onset 3 days ago. Pt reports her legs are shaking when she tries to walk, but she has not fallen or passed out.  Associated symptoms include low back pain which is chronic and unchanged. She takes tylenol #3 for this.  Pt is a poor historian andher daughter is at bedside but unable to answer most of my questions about her medications as she does not live with her mother. Both patient and daughter deny confusion.  She thinks she took her Tylenol #3 this AM.  She reports she did not take her insulin because her CBG was only 100.  Pt denies fever, chills, headache, neck pain, chest pain SOB, abd pain, N/V/D, dizziness, lightheadedness, syncope, dysuria hematuria.  Patient lives at home with her husband and has a home health nurse who visits x1 per month.      Past Medical History  Diagnosis Date  . Diabetes mellitus   . Hypertension   . Coronary artery disease   . Renal disorder   . Herniated lumbar intervertebral disc   . Cataract     cataract surgery scheduled for 03/31/11, 2 - left eye, 1 - right eye  . Renovascular hypertension      s/p left renal artery stent 12/2007.  S/P balloon angioplasty on 02/16/10 for ISR, BP was  controlled well since then.     . Claudication in peripheral vascular disease     02/16/10: Left CIA 9.0x28 Omnilink and REIA 8.0x40 seff  expanding [place].   right subclavian artery stent 03/18/2008,  . S/P carotid endarterectomy      left carotid endarterectomy  1991; Stroke and TIA in 1999 with right sided weakness, now with residual right arm weakness  . Stroke     TIA  . Peripheral vascular disease   . Anxiety   . Arthritis    Past Surgical History  Procedure Laterality Date  . Appendectomy    . Abdominal hysterectomy    . Ureteral stent placement    . Carotid endarterectomy    . Laminectomy    . Back surgery    . Colonoscopy  07/30/2011    Procedure: COLONOSCOPY;  Surgeon: Inda Castle, MD;  Location: [facility];  Service: Endoscopy;  Laterality: N/A;  gi bleed  . Esophagogastroduodenoscopy  07/30/2011    Procedure: ESOPHAGOGASTRODUODENOSCOPY (EGD);  Surgeon: Inda Castle, MD;  Location: [facility];  Service: Endoscopy;  Laterality: N/A;  . Pacemaker insertion     Family History  Problem Relation Age of Onset  . Cancer Father    History  Substance Use Topics  . Smoking status: Current Every Day Smoker -- 0.50 packs/day for 65 years    Types: Cigarettes  . Smokeless tobacco: Current User  . Alcohol Use: No   OB History   Grav Para Term Preterm Abortions TAB SAB Ect Mult Living  Review of Systems  Constitutional: Negative for fever, diaphoresis, appetite change, fatigue and unexpected weight change.  HENT: Negative for mouth sores.   Eyes: Negative for visual disturbance.  Respiratory: Negative for cough, chest tightness, shortness of breath and wheezing.   Cardiovascular: Negative for chest pain.  Gastrointestinal: Negative for nausea, vomiting, abdominal pain, diarrhea and constipation.  Endocrine: Negative for polydipsia, polyphagia and polyuria.  Genitourinary: Negative for dysuria, urgency, frequency and hematuria.  Musculoskeletal: Positive for arthralgias (bilateral knees, chronic) and back pain (chronic). Negative for neck stiffness.  Skin: Negative for rash.   Allergic/Immunologic: Negative for immunocompromised state.  Neurological: Positive for weakness. Negative for syncope, light-headedness and headaches.  Hematological: Does not bruise/bleed easily.  Psychiatric/Behavioral: Negative for sleep disturbance. The patient is not nervous/anxious.       Allergies  Lasix; Norvasc; and Peanut-containing drug products  Home Medications   Current Outpatient Rx  Name  Route  Sig  Dispense  Refill  . acetaminophen-codeine (TYLENOL #3) 300-30 MG per tablet   Oral   Take 1 tablet by mouth every 4 (four) hours as needed for moderate pain.   20 tablet   0   . ALPRAZolam (XANAX) 0.5 MG tablet   Oral   Take 0.5 mg by mouth 3 (three) times daily as needed for anxiety.          Marland Kitchen aspirin EC 81 MG tablet   Oral   Take 81 mg by mouth daily.         . ergocalciferol (VITAMIN D2) 50000 UNITS capsule   Oral   Take 50,000 Units by mouth 2 (two) times a week. Tuesday and friday         . furosemide (LASIX) 40 MG tablet   Oral   Take 40 mg by mouth 2 (two) times daily.         . hydrALAZINE (APRESOLINE) 50 MG tablet   Oral   Take 50 mg by mouth 4 (four) times daily.         . hydrochlorothiazide (MICROZIDE) 12.5 MG capsule   Oral   Take 12.5 mg by mouth daily.         . insulin NPH-regular Human (NOVOLIN 70/30) (70-30) 100 UNIT/ML injection   Subcutaneous   Inject 8 Units into the skin 2 (two) times daily with a meal.   10 mL   11   . isosorbide mononitrate (IMDUR) 120 MG 24 hr tablet   Oral   Take 1 tablet (120 mg total) by mouth daily.   30 tablet   0   . LOVAZA 1 G capsule   Oral   Take 1 g by mouth 2 (two) times daily.          . metoprolol (LOPRESSOR) 100 MG tablet   Oral   Take 100 mg by mouth 2 (two) times daily.         . rosuvastatin (CRESTOR) 10 MG tablet   Oral   Take 10 mg by mouth daily.         Marland Kitchen spironolactone (ALDACTONE) 50 MG tablet   Oral   Take 50 mg by mouth daily.         .  potassium chloride SA (K-DUR,KLOR-CON) 20 MEQ tablet   Oral   Take 1 tablet (20 mEq total) by mouth daily.   31 tablet   0    BP 191/85  Pulse 69  Temp(Src) 97.7 F (36.5 C) (Oral)  Resp 18  SpO2 98% Physical  Exam  Nursing note and vitals reviewed. Constitutional: She is oriented to person, place, and time. She appears well-developed and well-nourished. No distress.  Awake, alert, nontoxic appearance  HENT:  Head: Normocephalic and atraumatic.  Mouth/Throat: Oropharynx is clear and moist. No oropharyngeal exudate.  Eyes: Conjunctivae and EOM are normal. Pupils are equal, round, and reactive to light. No scleral icterus.  Neck: Normal range of motion and full passive range of motion without pain. Neck supple. No spinous process tenderness and no muscular tenderness present. No rigidity. Normal range of motion present.  Full ROM without pain  Cardiovascular: Normal rate, regular rhythm, normal heart sounds and intact distal pulses.   No murmur heard. Pulmonary/Chest: Effort normal and breath sounds normal. No respiratory distress. She has no wheezes.  Clear and equal breath sounds  Abdominal: Soft. Bowel sounds are normal. She exhibits no distension and no mass. There is no tenderness. There is no rebound and no guarding.  abd soft and nontender  Musculoskeletal: Normal range of motion. She exhibits no edema.  Full range of motion of the T-spine and L-spine No tenderness to palpation of the spinous processes of the T-spine or L-spine Mild tenderness to palpation of the paraspinous muscles of the L-spine No edema of the extremities  Lymphadenopathy:    She has no cervical adenopathy.  Neurological: She is alert and oriented to person, place, and time. She has normal reflexes. She exhibits normal muscle tone. Coordination normal. GCS eye subscore is 4. GCS verbal subscore is 5. GCS motor subscore is 6.  Reflex Scores:      Tricep reflexes are 2+ on the right side and 2+ on the left  side.      Bicep reflexes are 2+ on the right side and 2+ on the left side.      Brachioradialis reflexes are 2+ on the right side and 2+ on the left side.      Patellar reflexes are 2+ on the right side and 2+ on the left side.      Achilles reflexes are 2+ on the right side and 2+ on the left side. Mental Status:  Alert, oriented, thought content appropriate. Speech fluent without evidence of aphasia. Able to follow 2 step commands without difficulty.  Cranial Nerves:  II:  Peripheral visual fields grossly normal, pupils equal, round, reactive to light III,IV, VI: ptosis not present, extra-ocular motions intact bilaterally  V,VII: smile symmetric, facial light touch sensation equal VIII: hearing grossly normal bilaterally  IX,X: gag reflex present  XI: bilateral shoulder shrug equal and strong XII: midline tongue extension  Motor:  5/5 in upper and lower extremities bilaterally including strong and equal grip strength and dorsiflexion/plantar flexion Sensory: Pinprick and light touch normal in all extremities.  Deep Tendon Reflexes: 2+ and symmetric  Cerebellar: normal finger-to-nose with bilateral upper extremities Gait: not tested due to fall risk CV: distal pulses palpable throughout   Skin: Skin is warm and dry. No rash noted. She is not diaphoretic. No erythema.  Psychiatric: She has a normal mood and affect. Her behavior is normal.    ED Course  Procedures (including critical care time) Labs Review Labs Reviewed  CBC - Abnormal; Notable for the following:    RBC 3.72 (*)    MCV 101.6 (*)    MCH 34.9 (*)    Platelets 125 (*)    All other components within normal limits  BASIC METABOLIC PANEL - Abnormal; Notable for the following:    Potassium 2.6 (*)  Glucose, Bld 379 (*)    GFR calc non Af Amer 54 (*)    GFR calc Af Amer 62 (*)    All other components within normal limits  URINALYSIS, ROUTINE W REFLEX MICROSCOPIC - Abnormal; Notable for the following:    Glucose,  UA >1000 (*)    Protein, ur 100 (*)    All other components within normal limits  CBG MONITORING, ED - Abnormal; Notable for the following:    Glucose-Capillary 329 (*)    All other components within normal limits  TROPONIN I  URINE MICROSCOPIC-ADD ON   Imaging Review Dg Chest 2 View  06/02/2013   CLINICAL DATA:  Weakness.  EXAM: CHEST  2 VIEW  COMPARISON:  04/06/2013  FINDINGS: Lung base opacity most notable on the right is likely atelectasis. Lungs are otherwise clear. The cardiac silhouette is normal in size. Normal mediastinal and hilar contours. Left anterior chest wall sequential pacemaker is stable in well positioned. There is a short stent projecting along the right superior mediastinum. Bony thorax is demineralized.  IMPRESSION: No acute cardiopulmonary disease.   Electronically Signed   By: Lajean Manes M.D.   On: 06/02/2013 18:01     EKG Interpretation   Date/Time:  Saturday June 02 2013 17:21:11 EDT Ventricular Rate:  77 PR Interval:  175 QRS Duration: 89 QT Interval:  460 QTC Calculation: 521 R Axis:   19 Text Interpretation:  Sinus rhythm Atrial premature complex Probable LVH  with secondary repol abnrm (old) Anterior ST elevation, probably due to  LVH (old) Prolonged QT interval .normal Confirmed by BEATON  MD, ROBERT  ([id number]) on 06/02/2013 5:31:48 PM      MDM   Final diagnoses:  Hypokalemia  Weakness  Chronic back pain  IDDM (insulin dependent diabetes mellitus)  Hyperglycemia   Jessicaann B Moehring presents with generalized weakness for 3 days.  NO falls and no neurologic deficits on exam.  Pt also c/o low back pain, but reports this is chronic for her.   6:31 PM Pt with hypokalemia with repletion beginning in the department.  Hyperglycemia with AG of 13.  Pt given her home insulin dose and fluid bolus.  Urine pending.    Record review shows that pt was admitted on 04/05/13 for hypokalemia.  Upon discharge she reports that she restarted her lasix at home  because the "other medicine (spironolactone) wasn't working", but did not restart her potassium supplement.    CXR without evidence of pneumonia, pulmonary edema or pneumothorax. UA without evidence of urinary tract infection. Discussed with Dr. Ernestina Patches of [facility] who will admit.  The patient was discussed with and seen by Dr. Audie Pinto who agrees with the treatment plan.   Jarrett Soho Kaisy Severino, PA-C 06/02/13 2027

## 2013-06-02 NOTE — ED Notes (Signed)
Admitting MD in to assess pt for admission 

## 2013-06-02 NOTE — ED Notes (Signed)
Dinner tray ordered for pt

## 2013-06-03 ENCOUNTER — Encounter (HOSPITAL_COMMUNITY): Payer: Self-pay | Admitting: *Deleted

## 2013-06-03 DIAGNOSIS — M549 Dorsalgia, unspecified: Secondary | ICD-10-CM

## 2013-06-03 DIAGNOSIS — G8929 Other chronic pain: Secondary | ICD-10-CM

## 2013-06-03 DIAGNOSIS — R5381 Other malaise: Secondary | ICD-10-CM

## 2013-06-03 DIAGNOSIS — I10 Essential (primary) hypertension: Secondary | ICD-10-CM

## 2013-06-03 DIAGNOSIS — E860 Dehydration: Secondary | ICD-10-CM

## 2013-06-03 DIAGNOSIS — N183 Chronic kidney disease, stage 3 unspecified: Secondary | ICD-10-CM

## 2013-06-03 DIAGNOSIS — E1101 Type 2 diabetes mellitus with hyperosmolarity with coma: Secondary | ICD-10-CM

## 2013-06-03 DIAGNOSIS — R5383 Other fatigue: Secondary | ICD-10-CM

## 2013-06-03 LAB — CBC WITH DIFFERENTIAL/PLATELET
BASOS PCT: 0 % (ref 0–1)
Basophils Absolute: 0 10*3/uL (ref 0.0–0.1)
Eosinophils Absolute: 0.4 10*3/uL (ref 0.0–0.7)
Eosinophils Relative: 8 % — ABNORMAL HIGH (ref 0–5)
HEMATOCRIT: 33.1 % — AB (ref 36.0–46.0)
HEMOGLOBIN: 11 g/dL — AB (ref 12.0–15.0)
Lymphocytes Relative: 38 % (ref 12–46)
Lymphs Abs: 1.9 10*3/uL (ref 0.7–4.0)
MCH: 34.7 pg — ABNORMAL HIGH (ref 26.0–34.0)
MCHC: 33.2 g/dL (ref 30.0–36.0)
MCV: 104.4 fL — ABNORMAL HIGH (ref 78.0–100.0)
MONO ABS: 0.4 10*3/uL (ref 0.1–1.0)
MONOS PCT: 9 % (ref 3–12)
NEUTROS PCT: 45 % (ref 43–77)
Neutro Abs: 2.3 10*3/uL (ref 1.7–7.7)
Platelets: 120 10*3/uL — ABNORMAL LOW (ref 150–400)
RBC: 3.17 MIL/uL — ABNORMAL LOW (ref 3.87–5.11)
RDW: 14.2 % (ref 11.5–15.5)
WBC: 5 10*3/uL (ref 4.0–10.5)

## 2013-06-03 LAB — BASIC METABOLIC PANEL
BUN: 17 mg/dL (ref 6–23)
CO2: 27 mEq/L (ref 19–32)
Calcium: 8.5 mg/dL (ref 8.4–10.5)
Chloride: 103 mEq/L (ref 96–112)
Creatinine, Ser: 1.05 mg/dL (ref 0.50–1.10)
GFR, EST AFRICAN AMERICAN: 58 mL/min — AB (ref >90)
GFR, EST NON AFRICAN AMERICAN: 50 mL/min — AB (ref >90)
Glucose, Bld: 298 mg/dL — ABNORMAL HIGH (ref 70–99)
POTASSIUM: 3 meq/L — AB (ref 3.7–5.3)
SODIUM: 142 meq/L (ref 137–147)

## 2013-06-03 LAB — GLUCOSE, CAPILLARY
GLUCOSE-CAPILLARY: 288 mg/dL — AB (ref 70–99)
GLUCOSE-CAPILLARY: 316 mg/dL — AB (ref 70–99)
Glucose-Capillary: 81 mg/dL (ref 70–99)
Glucose-Capillary: 54 mg/dL — ABNORMAL LOW (ref 70–99)
Glucose-Capillary: 131 mg/dL — ABNORMAL HIGH (ref 70–99)
Glucose-Capillary: 135 mg/dL — ABNORMAL HIGH (ref 70–99)
Glucose-Capillary: 124 mg/dL — ABNORMAL HIGH (ref 70–99)

## 2013-06-03 LAB — COMPREHENSIVE METABOLIC PANEL
ALT: 44 U/L — AB (ref 0–35)
AST: 27 U/L (ref 0–37)
Albumin: 2.4 g/dL — ABNORMAL LOW (ref 3.5–5.2)
Alkaline Phosphatase: 110 U/L (ref 39–117)
BUN: 18 mg/dL (ref 6–23)
CALCIUM: 8.3 mg/dL — AB (ref 8.4–10.5)
CO2: 24 meq/L (ref 19–32)
Chloride: 106 mEq/L (ref 96–112)
Creatinine, Ser: 0.98 mg/dL (ref 0.50–1.10)
GFR calc Af Amer: 63 mL/min — ABNORMAL LOW (ref >90)
GFR, EST NON AFRICAN AMERICAN: 54 mL/min — AB (ref >90)
Glucose, Bld: 64 mg/dL — ABNORMAL LOW (ref 70–99)
POTASSIUM: 3.2 meq/L — AB (ref 3.7–5.3)
SODIUM: 142 meq/L (ref 137–147)
Total Bilirubin: <0.2 mg/dL — ABNORMAL LOW (ref 0.3–1.2)
Total Protein: 5.4 g/dL — ABNORMAL LOW (ref 6.0–8.3)

## 2013-06-03 LAB — HEMOGLOBIN A1C
HEMOGLOBIN A1C: 10.7 % — AB (ref <5.7)
Mean Plasma Glucose: 260 mg/dL — ABNORMAL HIGH (ref <117)

## 2013-06-03 LAB — POTASSIUM: Potassium: 4.1 mEq/L (ref 3.7–5.3)

## 2013-06-03 MED ORDER — HYDRALAZINE HCL 20 MG/ML IJ SOLN
10.0000 mg | Freq: Four times a day (QID) | INTRAMUSCULAR | Status: DC | PRN
  Administered 2013-06-03: 10 mg via INTRAVENOUS
  Filled 2013-06-03: qty 1

## 2013-06-03 MED ORDER — BIOTENE DRY MOUTH MT LIQD
15.0000 mL | Freq: Two times a day (BID) | OROMUCOSAL | Status: DC
  Administered 2013-06-03: 15 mL via OROMUCOSAL

## 2013-06-03 MED ORDER — HYDRALAZINE HCL 20 MG/ML IJ SOLN
5.0000 mg | Freq: Once | INTRAMUSCULAR | Status: AC
  Administered 2013-06-03: 5 mg via INTRAVENOUS
  Filled 2013-06-03: qty 1

## 2013-06-03 MED ORDER — FUROSEMIDE 20 MG PO TABS: 20.0000 mg | ORAL_TABLET | Freq: Every day | ORAL | Status: DC

## 2013-06-03 MED ORDER — POTASSIUM CHLORIDE CRYS ER 20 MEQ PO TBCR
40.0000 meq | EXTENDED_RELEASE_TABLET | Freq: Once | ORAL | Status: AC
  Administered 2013-06-03: 40 meq via ORAL
  Filled 2013-06-03: qty 2

## 2013-06-03 MED ORDER — POTASSIUM CHLORIDE CRYS ER 20 MEQ PO TBCR: 20.0000 meq | EXTENDED_RELEASE_TABLET | Freq: Every day | ORAL | Status: DC

## 2013-06-03 NOTE — Discharge Summary (Signed)
Physician Discharge Summary  Carmen [name] ZJQ:[id number] DOB: March 17, 1933 DOA: 06/02/2013  PCP: Maximino Greenland, MD  Admit date: 06/02/2013 Discharge date: 06/03/2013  Time spent: 35 minutes  Recommendations for Outpatient Follow-up:  Patient will be discharged to home to home PT evaluation.  She is to primary care physician within one week of discharge. Patient should have a BMP within one week of discharge to monitor her potassium.  Her primary care physician should discuss her Lasix use.  She should continue her medications as prescribed.   Discharge Diagnoses:  Generalized weakness Hypokalemia Hyperglycemia Hypertension Coronary artery disease Peripheral vascular disease  Discharge Condition: Stable  Diet recommendation: Heart healthy, carb modified  Filed Weights   06/02/13 2230 06/03/13 0500  Weight: 54.023 kg (119 lb 1.6 oz) 55.384 kg (122 lb 1.6 oz)    History of present illness:  This is a 80 y.o. year old female with multiple medical problems including IDDM, HTN, stage 3 CKD, peripheral vascular disease, pacemaker, recurrent hypokalemia presenting with weakness and hypokalemia. Patient is known to have been admitted January 29 to February 1 for hyperosmolar nonketotic hyperglycemia. Patient is known to have weakness, hypokalemia, acute and chronic renal sufficiency at that time. Per the patient and daughter, she was discontinued on Lasix in the hospital. However, was not instructed to restart Lasix per the patient. Patient states that she noticed worsening swelling at home and restarted on Lasix on her own. Patient did not restart oral potassium with this. Has been doing this for the past 4-6 weeks. Patient's had progressive weakness that is generalized over the past week. Less so we can assess him be worse in the right. No distal numbness or paresthesias. Patient denies any fevers chills. No shortness of breath, abdominal pain, diarrhea, dysuria. Patient does have a home  health RN and does come help at home at times. Per the patient and daughter, and they have been getting conflicting exceptional medication use. Per the family, the patient was instructed not sick grandson today because her sugar was above 100. These instructions came from the nurse. The patient saw her PCP immediately after hospital discharge back in February. However has not seen her PCP since this point. Blood sugars have been somewhat variable at home. Ranging in the 100s to 2-300s. No fevers or chills. Has had weakness, feels like weakness has been somewhat greater on L sided. No numbness or paresthesias. No slurred speech.  In the ER, patient with a noted potassium level of 2.6. Hemodynamically stable. Blood sugar 379. Borderline anion gap of 13. Urinalysis negative for infection. Chest x-ray negative for any focal infiltrate. Patient given oral as well as IV potassium.   Hospital Course:  Generalized weakness likely secondary to hypokalemia -Patient is currently on Lasix, HCTZ, spironolactone at home -Her potassium levels were depleted upon admission -She was given potassium chloride via IV as well as by mouth -Potassium is improving, currently 4.1 -Patient will need to her potassium checked within one week of discharge and she should also discuss her current diuretic use with her primary care physician. -Generalized weakness has improved, patient no longer felt weak and is able to ambulate without assistance. -Home health physical therapy to assess and evaluate patient for home health needs. -Vitamin D levels pending  Hypokalemia secondary to overdiuresis -Currently improving -Will discharge with spironolactone and Lasix 20 mg daily, holding her HCTZ  Hyperglycemia/ Diabetes mellitus  -Patient is to continue her home regimen -Last hemoglobin A1c on 04/06/2013 was 15.6 -She should discuss  her regimen with her primary care physician  Hypertension -Poorly controlled -Continue home  medications of hydralazine, Imdur lower, metoprolol, lisinopril, Lasix  Coronary artery disease/peripheral vascular disease -Continue home medications of aspirin, statin, beta blocker -Patient may need to be started on an ACE inhibitor, this should be discussed with her primary care physician  Thrombosed AVM on CT -Discussed with neurology, Dr. Aram Beecham, no recommendations at this time as patient is asymptomatic.   Procedures: None  Consultations: None  Discharge Exam: Filed Vitals:   06/03/13 1300  BP: 161/59  Pulse: 62  Temp: 98.4 F (36.9 C)  Resp: 18     General: Well developed, well nourished, NAD, appears stated age  HEENT: NCAT, PERRLA, EOMI, Anicteic Sclera, mucous membranes moist.   Neck: Supple, no JVD, no masses  Cardiovascular: S1 S2 auscultated, no rubs, murmurs or gallops. Regular rate and rhythm.  Respiratory: Clear to auscultation bilaterally with equal chest rise  Abdomen: Soft, nontender, nondistended, + bowel sounds  Extremities: warm dry without cyanosis clubbing or edema  Neuro: AAOx3, cranial nerves grossly intact. Strength 5/5 in patient's upper and lower extremities bilaterally  Skin: Without rashes exudates or nodules  Psych: Normal affect and demeanor with intact judgement and insight  Discharge Instructions      Discharge Orders   Future Orders Complete By Expires   Diet - low sodium heart healthy  As directed    Discharge instructions  As directed    Comments:     Patient will be discharged to home to home PT evaluation.  She is to primary care physician within one week of discharge. Patient should have a BMP within one week of discharge to monitor her potassium.  Her primary care physician should discuss her Lasix use.  She should continue her medications as prescribed.   Increase activity slowly  As directed        Medication List    STOP taking these medications       hydrochlorothiazide 12.5 MG capsule  Commonly known as:   MICROZIDE      TAKE these medications       acetaminophen-codeine 300-30 MG per tablet  Commonly known as:  TYLENOL #3  Take 1 tablet by mouth every 4 (four) hours as needed for moderate pain.     ALPRAZolam 0.5 MG tablet  Commonly known as:  XANAX  Take 0.5 mg by mouth 3 (three) times daily as needed for anxiety.     aspirin EC 81 MG tablet  Take 81 mg by mouth daily.     ergocalciferol 50000 UNITS capsule  Commonly known as:  VITAMIN D2  Take 50,000 Units by mouth 2 (two) times a week. Tuesday and friday     furosemide 20 MG tablet  Commonly known as:  LASIX  Take 1 tablet (20 mg total) by mouth daily.     hydrALAZINE 50 MG tablet  Commonly known as:  APRESOLINE  Take 50 mg by mouth 4 (four) times daily.     insulin NPH-regular Human (70-30) 100 UNIT/ML injection  Commonly known as:  NOVOLIN 70/30  Inject 8 Units into the skin 2 (two) times daily with a meal.     isosorbide mononitrate 120 MG 24 hr tablet  Commonly known as:  IMDUR  Take 1 tablet (120 mg total) by mouth daily.     LOVAZA 1 G capsule  Generic drug:  omega-3 acid ethyl esters  Take 1 g by mouth 2 (two) times daily.  metoprolol 100 MG tablet  Commonly known as:  LOPRESSOR  Take 100 mg by mouth 2 (two) times daily.     potassium chloride SA 20 MEQ tablet  Commonly known as:  K-DUR,KLOR-CON  Take 1 tablet (20 mEq total) by mouth daily.     rosuvastatin 10 MG tablet  Commonly known as:  CRESTOR  Take 10 mg by mouth daily.     spironolactone 50 MG tablet  Commonly known as:  ALDACTONE  Take 50 mg by mouth daily.       Allergies  Allergen Reactions  . Peanuts [Peanut Oil] Swelling  . Norvasc [Amlodipine Besylate] Swelling  . Peanut-Containing Drug Products Other (See Comments)    Cause my stomach to hurt   Follow-up Information   Follow up with Maximino Greenland, MD. Schedule an appointment as soon as possible for a visit in 1 week. [facility] follow up)    Specialty:  Internal Medicine     Contact information:   [address] [place] [id number] [phone]        The results of significant diagnostics from this hospitalization (including imaging, microbiology, ancillary and laboratory) are listed below for reference.    Significant Diagnostic Studies: Ct Angio Head W/cm &/or Wo Cm  06/03/2013   CLINICAL DATA:  Weakness, left more than right.  History of TIA.  EXAM: CT ANGIOGRAPHY HEAD  TECHNIQUE: Multidetector CT imaging of the head was performed using the standard protocol during bolus administration of intravenous contrast. Multiplanar CT image reconstructions and MIPs were obtained to evaluate the vascular anatomy.  CONTRAST:  12mL OMNIPAQUE IOHEXOL 350 MG/ML SOLN  COMPARISON:  10/17/2011  FINDINGS: Skull and Sinuses:No significant abnormality.  Orbits: No acute abnormality.  Brain:  There is linear, relatively homogeneous cortical and subcortical high attenuation in the anterior left insula and frontal operculum. This is new from 2013 imaging. There is volume loss in the region.  Advanced chronic small vessel disease with remote small vessel infarcts in the bilateral pons and in the left basal ganglia and deep white matter tracts from perforator infarcts. There is a small cortical infarct in the posterior and high left frontal lobe. No hematoma, acute hemorrhage, hydrocephalus, or mass lesion.  Previously seen arteriovenous malformation in the left frontal lobe is no longer evident. The arterial feeders are now attenuated. The high density changes noted above likely reflect mineralization from laminar necrosis. Petechial hemorrhage is an alternate consideration, but considered less likely given the clinical circumstances and associated atrophy. There definitely no focal hematoma.  Anterior circulation: Standard intracranial anatomy. There is heavy calcific atherosclerotic plaque in the carotid siphons, without evidence of significant stenosis. No major vessel  occlusion to suggest embolus or in situ thrombosis. No evidence of aneurysm.  Posterior circulation: Left dominant vertebrobasilar system. There is atherosclerosis of the basilar artery and proximal PCA arteries, without hemodynamically significant stenosis. No evidence of aneurysm.  Review of the MIP images confirms the above findings.  IMPRESSION: 1. A left frontal arteriovenous malformation has thrombosed since 2013, with new atrophy and mineralization in the associated brain parenchyma. 2. No acute findings to explain left-sided weakness.   Electronically Signed   By: Jorje Guild M.D.   On: 06/03/2013 00:21   Dg Chest 2 View  06/02/2013   CLINICAL DATA:  Weakness.  EXAM: CHEST  2 VIEW  COMPARISON:  04/06/2013  FINDINGS: Lung base opacity most notable on the right is likely atelectasis. Lungs are otherwise clear. The cardiac silhouette is normal  in size. Normal mediastinal and hilar contours. Left anterior chest wall sequential pacemaker is stable in well positioned. There is a short stent projecting along the right superior mediastinum. Bony thorax is demineralized.  IMPRESSION: No acute cardiopulmonary disease.   Electronically Signed   By: Lajean Manes M.D.   On: 06/02/2013 18:01    Microbiology: No results found for this or any previous visit (from the past 240 hour(s)).   Labs: Basic Metabolic Panel:  Recent Labs Lab 06/02/13 1610 06/02/13 2121 06/03/13 0012 06/03/13 0600 06/03/13 1430  NA 139  --  142 142  --   K 2.6*  --  3.0* 3.2* 4.1  CL 98  --  103 106  --   CO2 28  --  27 24  --   GLUCOSE 379*  --  298* 64*  --   BUN 16  --  17 18  --   CREATININE 0.99 0.94 1.05 0.98  --   CALCIUM 9.0  --  8.5 8.3*  --   MG  --  1.8  --   --   --    Liver Function Tests:  Recent Labs Lab 06/03/13 0600  AST 27  ALT 44*  ALKPHOS 110  BILITOT <0.2*  PROT 5.4*  ALBUMIN 2.4*   No results found for this basename: LIPASE, AMYLASE,  in the last 168 hours No results found for this  basename: AMMONIA,  in the last 168 hours CBC:  Recent Labs Lab 06/02/13 1610 06/02/13 2121 06/03/13 0600  WBC 5.7 4.3 5.0  NEUTROABS  --   --  2.3  HGB 13.0 12.3 11.0*  HCT 37.8 36.4 33.1*  MCV 101.6* 103.7* 104.4*  PLT 125* 120* 120*   Cardiac Enzymes:  Recent Labs Lab 06/02/13 1610  TROPONINI <0.30   BNP: BNP (last 3 results)  Recent Labs  08/12/12 1112 11/30/12 1509 12/11/12 1400  PROBNP 2540.0* 10923.0* 3706.0*   CBG:  Recent Labs Lab 06/03/13 0412 06/03/13 0759 06/03/13 0848 06/03/13 1147 06/03/13 1546  GLUCAP 81 54* 131* 135* 124*       Signed:  Thayer Embleton  [facility] 06/03/2013, 3:50 PM

## 2013-06-03 NOTE — Discharge Instructions (Signed)
Hypokalemia Hypokalemia means that the amount of potassium in the blood is lower than normal.Potassium is a chemical, called an electrolyte, that helps regulate the amount of fluid in the body. It also stimulates muscle contraction and helps nerves function properly.Most of the body's potassium is inside of cells, and only a very small amount is in the blood. Because the amount in the blood is so small, minor changes can be life-threatening. CAUSES  Antibiotics.  Diarrhea or vomiting.  Using laxatives too much, which can cause diarrhea.  Chronic kidney disease.  Water pills (diuretics).  Eating disorders (bulimia).  Low magnesium level.  Sweating a lot. SIGNS AND SYMPTOMS  Weakness.  Constipation.  Fatigue.  Muscle cramps.  Mental confusion.  Skipped heartbeats or irregular heartbeat (palpitations).  Tingling or numbness. DIAGNOSIS  Your health care provider can diagnose hypokalemia with blood tests. In addition to checking your potassium level, your health care provider may also check other lab tests. TREATMENT Hypokalemia can be treated with potassium supplements taken by mouth or adjustments in your current medicines. If your potassium level is very low, you may need to get potassium through a vein (IV) and be monitored in the hospital. A diet high in potassium is also helpful. Foods high in potassium are:  Nuts, such as peanuts and pistachios.  Seeds, such as sunflower seeds and pumpkin seeds.  Peas, lentils, and lima beans.  Whole grain and bran cereals and breads.  Fresh fruit and vegetables, such as apricots, avocado, bananas, cantaloupe, kiwi, oranges, tomatoes, asparagus, and potatoes.  Orange and tomato juices.  Red meats.  Fruit yogurt. HOME CARE INSTRUCTIONS  Take all medicines as prescribed by your health care provider.  Maintain a healthy diet by including nutritious food, such as fruits, vegetables, nuts, whole grains, and lean meats.  If  you are taking a laxative, be sure to follow the directions on the label. SEEK MEDICAL CARE IF:  Your weakness gets worse.  You feel your heart pounding or racing.  You are vomiting or having diarrhea.  You are diabetic and having trouble keeping your blood glucose in the normal range. SEEK IMMEDIATE MEDICAL CARE IF:  You have chest pain, shortness of breath, or dizziness.  You are vomiting or having diarrhea for more than 2 days.  You faint. MAKE SURE YOU:   Understand these instructions.  Will watch your condition.  Will get help right away if you are not doing well or get worse. Document Released: 02/22/2005 Document Revised: 12/13/2012 Document Reviewed: 08/25/2012 [facility] Patient Information 2014 [place]. Weakness Weakness is a lack of strength. It may be felt all over the body (generalized) or in one specific part of the body (focal). Some causes of weakness can be serious. You may need further medical evaluation, especially if you are elderly or you have a history of immunosuppression (such as chemotherapy or HIV), kidney disease, heart disease, or diabetes. CAUSES  Weakness can be caused by many different things, including:  Infection.  Physical exhaustion.  Internal bleeding or other blood loss that results in a lack of red blood cells (anemia).  Dehydration. This cause is more common in elderly people.  Side effects or electrolyte abnormalities from medicines, such as pain medicines or sedatives.  Emotional distress, anxiety, or depression.  Circulation problems, especially severe peripheral arterial disease.  Heart disease, such as rapid atrial fibrillation, bradycardia, or heart failure.  Nervous system disorders, such as Guillain-Barr syndrome, multiple sclerosis, or stroke. DIAGNOSIS  To find the cause of  your weakness, your caregiver will take your history and perform a physical exam. Lab tests or X-rays may also be ordered, if  needed. TREATMENT  Treatment of weakness depends on the cause of your symptoms and can vary greatly. HOME CARE INSTRUCTIONS   Rest as needed.  Eat a well-balanced diet.  Try to get some exercise every day.  Only take over-the-counter or prescription medicines as directed by your caregiver. SEEK MEDICAL CARE IF:   Your weakness seems to be getting worse or spreads to other parts of your body.  You develop new aches or pains. SEEK IMMEDIATE MEDICAL CARE IF:   You cannot perform your normal daily activities, such as getting dressed and feeding yourself.  You cannot walk up and down stairs, or you feel exhausted when you do so.  You have shortness of breath or chest pain.  You have difficulty moving parts of your body.  You have weakness in only one area of the body or on only one side of the body.  You have a fever.  You have trouble speaking or swallowing.  You cannot control your bladder or bowel movements.  You have black or bloody vomit or stools. MAKE SURE YOU:  Understand these instructions.  Will watch your condition.  Will get help right away if you are not doing well or get worse. Document Released: 02/22/2005 Document Revised: 08/24/2011 Document Reviewed: 04/23/2011 [facility] Patient Information 2014 [place].

## 2013-06-03 NOTE — Progress Notes (Signed)
Pt discharge instructions given, pt verbalized understanding.  VSS. Denies pain. Pt left floor ambulating accompanied by staff and family. 

## 2013-06-03 NOTE — Progress Notes (Signed)
Paged Dr. Barbaraann Rondo, pt doesn't have KCL prescription at home.  Pt is out.  Asked if patient could get KCL called in to pharmacy or written.

## 2013-06-03 NOTE — Progress Notes (Signed)
Utilization review completed.  P.J. Shalee Paolo,RN,BSN Case Manager 

## 2013-06-03 NOTE — Progress Notes (Signed)
New Admission Note:   Arrival: Arrived via bed from ED with Silvestre Mesi Mental Orientation: Alert and oriented x4 Telemetry: Ordered and on box 15 Assessment:  See doc flowsheet Skin: bottom and heels bilateral red but blanchable, otherwise unremarkable IV: clean, dry, saline locked Pain: no complaints of pain Safety Measures:  Call bell placed within reach; patient instructed on use of call bell and verbalized understanding. Bed in lowest position.  Non-skid socks on.  [address] [place] Orientation: Patient oriented to staff and room. Family: Daughter at bedside.  Orders have been reviewed and implemented. Will continue to monitor.  Arlyss Queen, RN, BSN

## 2013-06-03 NOTE — Progress Notes (Signed)
Patient BP 176/58.  Asymptomatic.  MD on call notified; hydralazine 5 mg IV ordered.  Will continue to monitor.

## 2013-06-03 NOTE — Progress Notes (Signed)
   CARE MANAGEMENT NOTE 06/03/2013  Patient:  FLEETA, KUNDE   Account Number:  [id number]  Date Initiated:  06/03/2013  Documentation initiated by:  [facility]  Subjective/Objective Assessment:   adm:  weakness, hypokalemia     Action/Plan:   discharge planning   Anticipated DC Date:  06/03/2013   Anticipated DC Plan:  [place]  CM consult      [facility] Choice  HOME HEALTH   Choice offered to / List presented to:          [facility] arranged  HH-2 PT      [place].   Status of service:  Completed, signed off Medicare Important Message given?   (If response is "NO", the following Medicare IM given date fields will be blank) Date Medicare IM given:   Date Additional Medicare IM given:    Discharge Disposition:  [place]  Per UR Regulation:    If discussed at Long Length of Stay Meetings, dates discussed:    Comments:  06/03/13 17:00 CM received call from MD to arrange HHPT for eval.  Pt was closed out of [place] this past week for [facility] and will now receive HHPT services.  Referral faxed to [facility] for HHPT eval.  No other CM needs were communicated.  Mariane Masters, BSN, CM [phone].

## 2013-06-03 NOTE — Progress Notes (Signed)
BP 197/70.  Given lopressor 100 mg as schedule.  Patient asymptomatic.  Notified MD on call; instructed to take blood pressure in an hour or two, and if still high, then to notify again for further instruction.  Will continue to monitor.

## 2013-06-03 NOTE — Progress Notes (Signed)
Pt tele rings brady.  Manually 64 adjusted leads.

## 2013-06-04 LAB — URINE CULTURE
Colony Count: NO GROWTH
Culture: NO GROWTH

## 2013-06-04 LAB — VITAMIN D 25 HYDROXY (VIT D DEFICIENCY, FRACTURES): Vit D, 25-Hydroxy: 22 ng/mL — ABNORMAL LOW (ref 30–89)

## 2013-06-10 LAB — VITAMIN D 1,25 DIHYDROXY
VITAMIN D 1, 25 (OH) TOTAL: 27 pg/mL (ref 18–72)
Vitamin D2 1, 25 (OH)2: 27 pg/mL

## 2013-07-01 ENCOUNTER — Other Ambulatory Visit: Payer: Self-pay | Admitting: Nurse Practitioner

## 2013-08-03 ENCOUNTER — Emergency Department (HOSPITAL_COMMUNITY): Payer: Medicare Other

## 2013-08-03 ENCOUNTER — Inpatient Hospital Stay (HOSPITAL_COMMUNITY)
Admission: EM | Admit: 2013-08-03 | Discharge: 2013-08-09 | DRG: 682 | Disposition: A | Discharge status: Home Health | Payer: Medicare Other | Attending: Internal Medicine | Admitting: Internal Medicine

## 2013-08-03 ENCOUNTER — Encounter (HOSPITAL_COMMUNITY): Payer: Self-pay | Admitting: Emergency Medicine

## 2013-08-03 DIAGNOSIS — Z91010 Allergy to peanuts: Secondary | ICD-10-CM

## 2013-08-03 DIAGNOSIS — E785 Hyperlipidemia, unspecified: Secondary | ICD-10-CM

## 2013-08-03 DIAGNOSIS — I150 Renovascular hypertension: Secondary | ICD-10-CM | POA: Diagnosis present

## 2013-08-03 DIAGNOSIS — E1151 Type 2 diabetes mellitus with diabetic peripheral angiopathy without gangrene: Secondary | ICD-10-CM

## 2013-08-03 DIAGNOSIS — G629 Polyneuropathy, unspecified: Secondary | ICD-10-CM

## 2013-08-03 DIAGNOSIS — A498 Other bacterial infections of unspecified site: Secondary | ICD-10-CM | POA: Diagnosis present

## 2013-08-03 DIAGNOSIS — R7989 Other specified abnormal findings of blood chemistry: Secondary | ICD-10-CM

## 2013-08-03 DIAGNOSIS — R64 Cachexia: Secondary | ICD-10-CM | POA: Diagnosis present

## 2013-08-03 DIAGNOSIS — Z7982 Long term (current) use of aspirin: Secondary | ICD-10-CM

## 2013-08-03 DIAGNOSIS — E41 Nutritional marasmus: Secondary | ICD-10-CM | POA: Diagnosis present

## 2013-08-03 DIAGNOSIS — R739 Hyperglycemia, unspecified: Secondary | ICD-10-CM

## 2013-08-03 DIAGNOSIS — R740 Nonspecific elevation of levels of transaminase and lactic acid dehydrogenase [LDH]: Secondary | ICD-10-CM

## 2013-08-03 DIAGNOSIS — F17200 Nicotine dependence, unspecified, uncomplicated: Secondary | ICD-10-CM | POA: Diagnosis present

## 2013-08-03 DIAGNOSIS — Z79899 Other long term (current) drug therapy: Secondary | ICD-10-CM

## 2013-08-03 DIAGNOSIS — I129 Hypertensive chronic kidney disease with stage 1 through stage 4 chronic kidney disease, or unspecified chronic kidney disease: Principal | ICD-10-CM | POA: Diagnosis present

## 2013-08-03 DIAGNOSIS — N182 Chronic kidney disease, stage 2 (mild): Secondary | ICD-10-CM | POA: Diagnosis present

## 2013-08-03 DIAGNOSIS — F0390 Unspecified dementia without behavioral disturbance: Secondary | ICD-10-CM | POA: Diagnosis present

## 2013-08-03 DIAGNOSIS — E876 Hypokalemia: Secondary | ICD-10-CM

## 2013-08-03 DIAGNOSIS — E1159 Type 2 diabetes mellitus with other circulatory complications: Secondary | ICD-10-CM | POA: Diagnosis present

## 2013-08-03 DIAGNOSIS — E43 Unspecified severe protein-calorie malnutrition: Secondary | ICD-10-CM | POA: Insufficient documentation

## 2013-08-03 DIAGNOSIS — Z888 Allergy status to other drugs, medicaments and biological substances status: Secondary | ICD-10-CM

## 2013-08-03 DIAGNOSIS — I739 Peripheral vascular disease, unspecified: Secondary | ICD-10-CM | POA: Diagnosis present

## 2013-08-03 DIAGNOSIS — W19XXXA Unspecified fall, initial encounter: Secondary | ICD-10-CM | POA: Diagnosis present

## 2013-08-03 DIAGNOSIS — Z794 Long term (current) use of insulin: Secondary | ICD-10-CM

## 2013-08-03 DIAGNOSIS — E860 Dehydration: Secondary | ICD-10-CM

## 2013-08-03 DIAGNOSIS — Y92009 Unspecified place in unspecified non-institutional (private) residence as the place of occurrence of the external cause: Secondary | ICD-10-CM

## 2013-08-03 DIAGNOSIS — Z7902 Long term (current) use of antithrombotics/antiplatelets: Secondary | ICD-10-CM

## 2013-08-03 DIAGNOSIS — M6282 Rhabdomyolysis: Secondary | ICD-10-CM | POA: Diagnosis present

## 2013-08-03 DIAGNOSIS — N390 Urinary tract infection, site not specified: Secondary | ICD-10-CM | POA: Diagnosis present

## 2013-08-03 DIAGNOSIS — E1100 Type 2 diabetes mellitus with hyperosmolarity without nonketotic hyperglycemic-hyperosmolar coma (NKHHC): Secondary | ICD-10-CM

## 2013-08-03 DIAGNOSIS — N183 Chronic kidney disease, stage 3 unspecified: Secondary | ICD-10-CM

## 2013-08-03 DIAGNOSIS — E1165 Type 2 diabetes mellitus with hyperglycemia: Secondary | ICD-10-CM

## 2013-08-03 DIAGNOSIS — R945 Abnormal results of liver function studies: Secondary | ICD-10-CM

## 2013-08-03 DIAGNOSIS — N189 Chronic kidney disease, unspecified: Secondary | ICD-10-CM

## 2013-08-03 DIAGNOSIS — I639 Cerebral infarction, unspecified: Secondary | ICD-10-CM

## 2013-08-03 DIAGNOSIS — I69959 Hemiplegia and hemiparesis following unspecified cerebrovascular disease affecting unspecified side: Secondary | ICD-10-CM

## 2013-08-03 DIAGNOSIS — R531 Weakness: Secondary | ICD-10-CM

## 2013-08-03 DIAGNOSIS — Z9861 Coronary angioplasty status: Secondary | ICD-10-CM

## 2013-08-03 DIAGNOSIS — R7401 Elevation of levels of liver transaminase levels: Secondary | ICD-10-CM | POA: Diagnosis present

## 2013-08-03 DIAGNOSIS — E875 Hyperkalemia: Secondary | ICD-10-CM | POA: Diagnosis present

## 2013-08-03 DIAGNOSIS — Z791 Long term (current) use of non-steroidal anti-inflammatories (NSAID): Secondary | ICD-10-CM

## 2013-08-03 DIAGNOSIS — Z8673 Personal history of transient ischemic attack (TIA), and cerebral infarction without residual deficits: Secondary | ICD-10-CM

## 2013-08-03 DIAGNOSIS — F411 Generalized anxiety disorder: Secondary | ICD-10-CM | POA: Diagnosis present

## 2013-08-03 DIAGNOSIS — Z950 Presence of cardiac pacemaker: Secondary | ICD-10-CM

## 2013-08-03 DIAGNOSIS — Z720 Tobacco use: Secondary | ICD-10-CM

## 2013-08-03 DIAGNOSIS — N179 Acute kidney failure, unspecified: Secondary | ICD-10-CM

## 2013-08-03 DIAGNOSIS — I2510 Atherosclerotic heart disease of native coronary artery without angina pectoris: Secondary | ICD-10-CM | POA: Diagnosis present

## 2013-08-03 DIAGNOSIS — IMO0002 Reserved for concepts with insufficient information to code with codable children: Secondary | ICD-10-CM

## 2013-08-03 DIAGNOSIS — I160 Hypertensive urgency: Secondary | ICD-10-CM | POA: Diagnosis present

## 2013-08-03 DIAGNOSIS — R7402 Elevation of levels of lactic acid dehydrogenase (LDH): Secondary | ICD-10-CM | POA: Diagnosis present

## 2013-08-03 DIAGNOSIS — I169 Hypertensive crisis, unspecified: Secondary | ICD-10-CM | POA: Diagnosis present

## 2013-08-03 DIAGNOSIS — E119 Type 2 diabetes mellitus without complications: Secondary | ICD-10-CM | POA: Diagnosis present

## 2013-08-03 DIAGNOSIS — I10 Essential (primary) hypertension: Secondary | ICD-10-CM

## 2013-08-03 LAB — COMPREHENSIVE METABOLIC PANEL
ALBUMIN: 2.7 g/dL — AB (ref 3.5–5.2)
ALK PHOS: 85 U/L (ref 39–117)
ALT: 92 U/L — ABNORMAL HIGH (ref 0–35)
AST: 79 U/L — ABNORMAL HIGH (ref 0–37)
BUN: 19 mg/dL (ref 6–23)
CALCIUM: 8.3 mg/dL — AB (ref 8.4–10.5)
CO2: 21 mEq/L (ref 19–32)
Chloride: 103 mEq/L (ref 96–112)
Creatinine, Ser: 1.47 mg/dL — ABNORMAL HIGH (ref 0.50–1.10)
GFR calc Af Amer: 39 mL/min — ABNORMAL LOW (ref >90)
GFR calc non Af Amer: 33 mL/min — ABNORMAL LOW (ref >90)
Glucose, Bld: 462 mg/dL — ABNORMAL HIGH (ref 70–99)
POTASSIUM: 4.4 meq/L (ref 3.7–5.3)
SODIUM: 135 meq/L — AB (ref 137–147)
Total Bilirubin: <0.2 mg/dL — ABNORMAL LOW (ref 0.3–1.2)
Total Protein: 5.9 g/dL — ABNORMAL LOW (ref 6.0–8.3)

## 2013-08-03 LAB — URINALYSIS, ROUTINE W REFLEX MICROSCOPIC
Bilirubin Urine: NEGATIVE
Hgb urine dipstick: NEGATIVE
KETONES UR: NEGATIVE mg/dL
Leukocytes, UA: NEGATIVE
NITRITE: NEGATIVE
PH: 5.5 (ref 5.0–8.0)
Protein, ur: 100 mg/dL — AB
Specific Gravity, Urine: 1.019 (ref 1.005–1.030)
Urobilinogen, UA: 0.2 mg/dL (ref 0.0–1.0)

## 2013-08-03 LAB — CBG MONITORING, ED
GLUCOSE-CAPILLARY: 321 mg/dL — AB (ref 70–99)
Glucose-Capillary: 478 mg/dL — ABNORMAL HIGH (ref 70–99)
Glucose-Capillary: 241 mg/dL — ABNORMAL HIGH (ref 70–99)

## 2013-08-03 LAB — URINE MICROSCOPIC-ADD ON

## 2013-08-03 LAB — CBC
HCT: 37.5 % (ref 36.0–46.0)
Hemoglobin: 12.7 g/dL (ref 12.0–15.0)
MCH: 33.2 pg (ref 26.0–34.0)
MCHC: 33.9 g/dL (ref 30.0–36.0)
MCV: 97.9 fL (ref 78.0–100.0)
Platelets: 153 10*3/uL (ref 150–400)
RBC: 3.83 MIL/uL — ABNORMAL LOW (ref 3.87–5.11)
RDW: 13.1 % (ref 11.5–15.5)
WBC: 2.9 10*3/uL — ABNORMAL LOW (ref 4.0–10.5)

## 2013-08-03 LAB — GLUCOSE, CAPILLARY: Glucose-Capillary: 172 mg/dL — ABNORMAL HIGH (ref 70–99)

## 2013-08-03 MED ORDER — HYDRALAZINE HCL 50 MG PO TABS
50.0000 mg | ORAL_TABLET | Freq: Four times a day (QID) | ORAL | Status: DC
  Filled 2013-08-03 (×4): qty 1

## 2013-08-03 MED ORDER — SODIUM CHLORIDE 0.9 % IV BOLUS (SEPSIS)
500.0000 mL | Freq: Once | INTRAVENOUS | Status: AC
  Administered 2013-08-03: 500 mL via INTRAVENOUS

## 2013-08-03 MED ORDER — HEPARIN SODIUM (PORCINE) 5000 UNIT/ML IJ SOLN
5000.0000 [IU] | Freq: Three times a day (TID) | INTRAMUSCULAR | Status: DC
  Administered 2013-08-03 – 2013-08-09 (×17): 5000 [IU] via SUBCUTANEOUS
  Filled 2013-08-03 (×20): qty 1

## 2013-08-03 MED ORDER — ATORVASTATIN CALCIUM 20 MG PO TABS
20.0000 mg | ORAL_TABLET | Freq: Every day | ORAL | Status: DC
  Administered 2013-08-04 – 2013-08-08 (×5): 20 mg via ORAL
  Filled 2013-08-03 (×6): qty 1

## 2013-08-03 MED ORDER — INSULIN ASPART 100 UNIT/ML ~~LOC~~ SOLN
0.0000 [IU] | Freq: Three times a day (TID) | SUBCUTANEOUS | Status: DC
  Administered 2013-08-04: 7 [IU] via SUBCUTANEOUS
  Administered 2013-08-04: 3 [IU] via SUBCUTANEOUS
  Administered 2013-08-04: 5 [IU] via SUBCUTANEOUS
  Administered 2013-08-05: 2 [IU] via SUBCUTANEOUS
  Administered 2013-08-05 (×2): 3 [IU] via SUBCUTANEOUS
  Administered 2013-08-06 (×2): 2 [IU] via SUBCUTANEOUS
  Administered 2013-08-06: 5 [IU] via SUBCUTANEOUS
  Administered 2013-08-07: 2 [IU] via SUBCUTANEOUS
  Administered 2013-08-07: 3 [IU] via SUBCUTANEOUS
  Administered 2013-08-07 – 2013-08-08 (×2): 2 [IU] via SUBCUTANEOUS
  Administered 2013-08-08: 3 [IU] via SUBCUTANEOUS
  Administered 2013-08-09: 1 [IU] via SUBCUTANEOUS

## 2013-08-03 MED ORDER — CLONIDINE HCL 0.2 MG PO TABS
0.2000 mg | ORAL_TABLET | Freq: Once | ORAL | Status: AC
  Administered 2013-08-03: 0.2 mg via ORAL
  Filled 2013-08-03: qty 1

## 2013-08-03 MED ORDER — ASPIRIN EC 81 MG PO TBEC
81.0000 mg | DELAYED_RELEASE_TABLET | Freq: Every day | ORAL | Status: DC
  Administered 2013-08-04 – 2013-08-09 (×6): 81 mg via ORAL
  Filled 2013-08-03 (×6): qty 1

## 2013-08-03 MED ORDER — ALPRAZOLAM 0.5 MG PO TABS: 0.5000 mg | ORAL_TABLET | Freq: Three times a day (TID) | ORAL | Status: DC | PRN

## 2013-08-03 MED ORDER — INSULIN ASPART 100 UNIT/ML ~~LOC~~ SOLN
8.0000 [IU] | Freq: Once | SUBCUTANEOUS | Status: AC
  Administered 2013-08-03: 8 [IU] via SUBCUTANEOUS
  Filled 2013-08-03: qty 1

## 2013-08-03 MED ORDER — LABETALOL HCL 5 MG/ML IV SOLN
0.5000 mg/min | INTRAVENOUS | Status: DC
  Administered 2013-08-03: 0.5 mg/min via INTRAVENOUS
  Administered 2013-08-04: 2.5 mg/min via INTRAVENOUS
  Administered 2013-08-04: 3 mg/min via INTRAVENOUS
  Administered 2013-08-04: 2.5 mg/min via INTRAVENOUS
  Filled 2013-08-03 (×4): qty 100

## 2013-08-03 MED ORDER — ACETAMINOPHEN 325 MG PO TABS
650.0000 mg | ORAL_TABLET | Freq: Once | ORAL | Status: AC
Start: 2013-08-03 — End: 2013-08-03
  Administered 2013-08-03: 650 mg via ORAL
  Filled 2013-08-03: qty 2

## 2013-08-03 MED ORDER — NICARDIPINE HCL IN NACL 20-0.86 MG/200ML-% IV SOLN: 3.0000 mg/h | INTRAVENOUS | Status: DC

## 2013-08-03 MED ORDER — MORPHINE SULFATE 2 MG/ML IJ SOLN
2.0000 mg | INTRAMUSCULAR | Status: DC | PRN
  Administered 2013-08-05: 2 mg via INTRAVENOUS
  Filled 2013-08-03: qty 1

## 2013-08-03 MED ORDER — MORPHINE SULFATE 4 MG/ML IJ SOLN
4.0000 mg | Freq: Once | INTRAMUSCULAR | Status: AC
  Administered 2013-08-03: 4 mg via INTRAVENOUS
  Filled 2013-08-03: qty 1

## 2013-08-03 MED ORDER — POTASSIUM CHLORIDE CRYS ER 20 MEQ PO TBCR
20.0000 meq | EXTENDED_RELEASE_TABLET | Freq: Every day | ORAL | Status: DC
  Administered 2013-08-03 – 2013-08-04 (×2): 20 meq via ORAL
  Filled 2013-08-03 (×2): qty 1

## 2013-08-03 MED ORDER — SODIUM CHLORIDE 0.9 % IJ SOLN
3.0000 mL | Freq: Two times a day (BID) | INTRAMUSCULAR | Status: DC
  Administered 2013-08-03 – 2013-08-09 (×9): 3 mL via INTRAVENOUS

## 2013-08-03 MED ORDER — IBUPROFEN 200 MG PO TABS
200.0000 mg | ORAL_TABLET | Freq: Four times a day (QID) | ORAL | Status: DC | PRN
  Filled 2013-08-03: qty 1

## 2013-08-03 MED ORDER — METOPROLOL TARTRATE 25 MG PO TABS
100.0000 mg | ORAL_TABLET | Freq: Once | ORAL | Status: AC
  Administered 2013-08-03: 100 mg via ORAL
  Filled 2013-08-03: qty 4

## 2013-08-03 MED ORDER — LABETALOL HCL 5 MG/ML IV SOLN
20.0000 mg | Freq: Once | INTRAVENOUS | Status: AC
  Administered 2013-08-03: 20 mg via INTRAVENOUS
  Filled 2013-08-03: qty 4

## 2013-08-03 MED ORDER — HYDRALAZINE HCL 50 MG PO TABS
50.0000 mg | ORAL_TABLET | Freq: Once | ORAL | Status: AC
  Administered 2013-08-03: 50 mg via ORAL
  Filled 2013-08-03: qty 1

## 2013-08-03 MED ORDER — ISOSORBIDE MONONITRATE ER 60 MG PO TB24
120.0000 mg | ORAL_TABLET | Freq: Every day | ORAL | Status: DC
  Administered 2013-08-05 – 2013-08-07 (×3): 120 mg via ORAL
  Filled 2013-08-03 (×5): qty 2

## 2013-08-03 MED ORDER — INSULIN ASPART PROT & ASPART (70-30 MIX) 100 UNIT/ML ~~LOC~~ SUSP
8.0000 [IU] | Freq: Two times a day (BID) | SUBCUTANEOUS | Status: DC
Start: 2013-08-04 — End: 2013-08-09
  Administered 2013-08-04 – 2013-08-09 (×10): 8 [IU] via SUBCUTANEOUS
  Filled 2013-08-03: qty 10

## 2013-08-03 MED ORDER — CLOPIDOGREL BISULFATE 75 MG PO TABS
75.0000 mg | ORAL_TABLET | Freq: Every day | ORAL | Status: DC
  Administered 2013-08-04 – 2013-08-09 (×6): 75 mg via ORAL
  Filled 2013-08-03 (×7): qty 1

## 2013-08-03 MED ORDER — METOPROLOL TARTRATE 100 MG PO TABS: 100.0000 mg | ORAL_TABLET | Freq: Two times a day (BID) | ORAL | Status: DC

## 2013-08-03 MED ORDER — OMEGA-3-ACID ETHYL ESTERS 1 G PO CAPS
1.0000 g | ORAL_CAPSULE | Freq: Two times a day (BID) | ORAL | Status: DC
  Administered 2013-08-03 – 2013-08-09 (×12): 1 g via ORAL
  Filled 2013-08-03 (×15): qty 1

## 2013-08-03 NOTE — ED Notes (Signed)
Bed: YP95 Expected date:  Expected time:  Means of arrival:  Comments: EMS-fall-hyperglycemia

## 2013-08-03 NOTE — ED Notes (Signed)
Patient transported to X-ray 

## 2013-08-03 NOTE — ED Notes (Signed)
Denita (grandaughter) [phone]

## 2013-08-03 NOTE — ED Notes (Signed)
Patient reports she took her blood pressure medication this morning but is due for her evening dose.

## 2013-08-03 NOTE — ED Notes (Signed)
Upon assessment of potential injury related to fall. Pt presents with productive cough. Pt reports cough for 3 days but denies SOB or CP with cough.

## 2013-08-03 NOTE — ED Notes (Signed)
Patient reports she did not take any insulin this morning.  Son took blood sugar, but patient could not remember what the result was.  Family member said it was 19.

## 2013-08-03 NOTE — H&P (Addendum)
[facility] History and Physical  Carmen [name] GGY:[id number] DOB: [birth year] DOA: 08/03/2013  Referring physician: EDP PCP: Maximino Greenland, MD   Chief Complaint: Fall   HPI: Carmen [name] is a 80 y.o. female who presents to the ED after a fall that occurred while at home.  She was carrying food, turned quickly and fell down on her bottom.  Thankfully she was uninjured in fall; however, patient hypertensive with SBPs persistently in the 220s-240s in the ED.  She denies missing any meds but isnt the greatest historian due to obvious forgetfulness on exam.  Per family her mental status has been and is currently at her baseline.  Other lab abnormalities include a BGL of over 400, creatinine 1.47 (was 1.0 on discharge last time) and her transaminases continue to be chronically elevated.  Review of Systems: No Systems reviewed.  As above, otherwise negative  Past Medical History  Diagnosis Date  . Diabetes mellitus   . Hypertension   . Coronary artery disease   . Renal disorder   . Herniated lumbar intervertebral disc   . Cataract     cataract surgery scheduled for 03/31/11, 2 - left eye, 1 - right eye  . Renovascular hypertension      s/p left renal artery stent 12/2007.  S/P balloon angioplasty on 02/16/10 for ISR, BP was  controlled well since then.     . Claudication in peripheral vascular disease     02/16/10: Left CIA 9.0x28 Omnilink and REIA 8.0x40 seff expanding Zilver.   right subclavian artery stent 03/18/2008,  . S/P carotid endarterectomy      left carotid endarterectomy  1991; Stroke and TIA in 1999 with right sided weakness, now with residual right arm weakness  . Stroke     TIA  . Peripheral vascular disease   . Anxiety   . Arthritis   . Pacemaker    Past Surgical History  Procedure Laterality Date  . Appendectomy    . Abdominal hysterectomy    . Ureteral stent placement    . Carotid endarterectomy    . Laminectomy    . Back surgery    . Colonoscopy   07/30/2011    Procedure: COLONOSCOPY;  Surgeon: Inda Castle, MD;  Location: [place];  Service: Endoscopy;  Laterality: N/A;  gi bleed  . Esophagogastroduodenoscopy  07/30/2011    Procedure: ESOPHAGOGASTRODUODENOSCOPY (EGD);  Surgeon: Inda Castle, MD;  Location: [place];  Service: Endoscopy;  Laterality: N/A;  . Pacemaker insertion     Social History:  reports that she has been smoking Cigarettes.  She has a 32.5 pack-year smoking history. She uses smokeless tobacco. She reports that she does not drink alcohol or use illicit drugs.  Allergies  Allergen Reactions  . Peanuts [Peanut Oil] Swelling  . Norvasc [Amlodipine Besylate] Swelling  . Peanut-Containing Drug Products Other (See Comments)    Cause my stomach to hurt    Family History  Problem Relation Age of Onset  . Cancer Father      Prior to Admission medications   Medication Sig Start Date End Date Taking? Authorizing Provider  ALPRAZolam Duanne Moron) 0.5 MG tablet Take 0.5 mg by mouth 3 (three) times daily as needed for anxiety.  05/30/13  Yes Historical Provider, MD  aspirin EC 81 MG tablet Take 81 mg by mouth daily.   Yes Historical Provider, MD  clopidogrel (PLAVIX) 75 MG tablet Take 75 mg by mouth daily. 08/01/13  Yes Historical Provider, MD  ergocalciferol (VITAMIN  D2) 50000 UNITS capsule Take 50,000 Units by mouth 2 (two) times a week. Tuesday and friday   Yes Historical Provider, MD  furosemide (LASIX) 20 MG tablet Take 1 tablet (20 mg total) by mouth daily. 06/03/13  Yes Maryann Mikhail, DO  hydrALAZINE (APRESOLINE) 50 MG tablet Take 50 mg by mouth 4 (four) times daily.   Yes Historical Provider, MD  ibuprofen (ADVIL,MOTRIN) 200 MG tablet Take 200 mg by mouth every 6 (six) hours as needed for moderate pain.   Yes Historical Provider, MD  insulin NPH-regular Human (NOVOLIN 70/30) (70-30) 100 UNIT/ML injection Inject 8 Units into the skin 2 (two) times daily with a meal. 04/08/13  Yes Eugenie Filler, MD  isosorbide  mononitrate (IMDUR) 120 MG 24 hr tablet Take 1 tablet (120 mg total) by mouth daily. 12/12/12  Yes Annita Brod, MD  LOVAZA 1 G capsule Take 1 g by mouth 2 (two) times daily.  04/12/13  Yes Historical Provider, MD  metoprolol (LOPRESSOR) 100 MG tablet Take 100 mg by mouth 2 (two) times daily.   Yes Historical Provider, MD  potassium chloride SA (K-DUR,KLOR-CON) 20 MEQ tablet Take 1 tablet (20 mEq total) by mouth daily. 06/03/13  Yes Maryann Mikhail, DO  rosuvastatin (CRESTOR) 10 MG tablet Take 10 mg by mouth daily.   Yes Historical Provider, MD  spironolactone (ALDACTONE) 50 MG tablet Take 50 mg by mouth daily.   Yes Historical Provider, MD   Physical Exam: Filed Vitals:   08/03/13 2010  BP: 223/80  Pulse: 61  Temp: 97.9 F (36.6 C)  Resp: 20    BP 223/80  Pulse 61  Temp(Src) 97.9 F (36.6 C) (Oral)  Resp 20  SpO2 100%  General Appearance:    Alert, oriented, some difficulty with memory of medication doses, no distress, appears stated age  Head:    Normocephalic, atraumatic  Eyes:    PERRL, EOMI, sclera non-icteric        Nose:   Nares without drainage or epistaxis. Mucosa, turbinates normal  Throat:   Moist mucous membranes. Oropharynx without erythema or exudate.  Neck:   Supple. No carotid bruits.  No thyromegaly.  No lymphadenopathy.   Back:     No CVA tenderness, no spinal tenderness  Lungs:     Clear to auscultation bilaterally, without wheezes, rhonchi or rales  Chest wall:    No tenderness to palpitation  Heart:    Regular rate and rhythm without murmurs, gallops, rubs  Abdomen:     Soft, non-tender, nondistended, normal bowel sounds, no organomegaly  Genitalia:    deferred  Rectal:    deferred  Extremities:   No clubbing, cyanosis or edema.  Pulses:   2+ and symmetric all extremities  Skin:   Skin color, texture, turgor normal, no rashes or lesions  Lymph nodes:   Cervical, supraclavicular, and axillary nodes normal  Neurologic:   CNII-XII intact. Normal strength,  sensation and reflexes      throughout    Labs on Admission:  Basic Metabolic Panel:  Recent Labs Lab 08/03/13 1544  NA 135*  K 4.4  CL 103  CO2 21  GLUCOSE 462*  BUN 19  CREATININE 1.47*  CALCIUM 8.3*   Liver Function Tests:  Recent Labs Lab 08/03/13 1544  AST 79*  ALT 92*  ALKPHOS 85  BILITOT <0.2*  PROT 5.9*  ALBUMIN 2.7*   No results found for this basename: LIPASE, AMYLASE,  in the last 168 hours No results found for  this basename: AMMONIA,  in the last 168 hours CBC:  Recent Labs Lab 08/03/13 1544  WBC 2.9*  HGB 12.7  HCT 37.5  MCV 97.9  PLT 153   Cardiac Enzymes: No results found for this basename: CKTOTAL, CKMB, CKMBINDEX, TROPONINI,  in the last 168 hours  BNP (last 3 results)  Recent Labs  08/12/12 1112 11/30/12 1509 12/11/12 1400  PROBNP 2540.0* 10923.0* 3706.0*   CBG:  Recent Labs Lab 08/03/13 1508 08/03/13 1836 08/03/13 2009  GLUCAP 478* 321* 241*    Radiological Exams on Admission: Dg Chest 2 View  08/03/2013   CLINICAL DATA:  Hypertension  EXAM: CHEST  2 VIEW  COMPARISON:  06/02/2013  FINDINGS: Left-sided pacemaker overlies normal cardiac silhouette. No effusion, infiltrate, or pneumothorax. Vascular stent noted the right lung apex. Improved aeration the lung bases. Severe degenerate changes of the right shoulder.  IMPRESSION: 1.  No acute cardiopulmonary process. 2. Slight improved aeration the lung bases compared to prior. Per 3 severe degenerative change in the right shoulder.   Electronically Signed   By: Suzy Bouchard M.D.   On: 08/03/2013 16:19   Dg Shoulder Right  08/03/2013   CLINICAL DATA:  Fall. Right shoulder injury and pain. Decreased range of motion.  EXAM: RIGHT SHOULDER - 2+ VIEW  COMPARISON:  04/11/2008  FINDINGS: No evidence of acute fracture or dislocation. Diffuse osteopenia noted. Severe glenohumeral osteoarthritis is demonstrated. Peripheral vascular calcification also noted.  IMPRESSION: No acute findings.   Severe glenohumeral osteoarthritis.  Osteopenia.   Electronically Signed   By: Earle Gell M.D.   On: 08/03/2013 16:16    EKG: Independently reviewed.  Assessment/Plan Principal Problem:   Hypertensive urgency Active Problems:   DM (diabetes mellitus), type 2, uncontrolled, periph vascular complic   Retrovascular hypertension status post left renal artery stent 2009 and balloon angioplasty for in-stent restenosis 2011   History of CVA (cerebrovascular accident) with associated mild right upper extremity hemiplegia   Transaminitis   Renal failure (ARF), acute on chronic   1. Hypertensive urgency - with kidney involvement, BPs persistently in the 220s to 240s.  Patient with h/o Renovascular HTN, got LRA stent in 2009, and then had in-stent restenosis so got balloon angiplasty for this in 2011.  Ordering renal artery [place] to evaluate if these have stenosed again.  If so, it appears that Dr. Einar Gip is her vascular surgeon. 2. Acute on chronic kidney injury - creatinine 1.47, baseline appears to be 1.0 from discharge in March.  Suspect hypertensive injury.  Monitor intake and output and repeat in AM. 3. H/o CVA - despite this, no evidence today of AMS nor new focal neurologic findings on exam, nor neurologic complaints per the patient associated with the fall. 4. DM2 - keeping patient on 70/30 and adding SSI sensitive scale as well.   Code Status: Full Code  Family Communication: No family in room Disposition Plan: Admit to inpatient   Time spent: 64 min  [facility] Pager [phone]  If 7AM-7PM, please contact the day team taking care of the patient [email or web address] Password [facility] 08/03/2013, 8:37 PM

## 2013-08-03 NOTE — ED Notes (Signed)
Spoke with lab, they are resulting and to give them 5-10 mins

## 2013-08-03 NOTE — ED Notes (Signed)
Was able to obtain Urine from female urinal. Notified Dr. Ashok Cordia

## 2013-08-03 NOTE — ED Notes (Signed)
Per EMS pt standing and right leg gave out. Pt has chronic thoracic pain and right arm pain. EMS reports right arm appears dislocated but family says that is pt normal.  Pt has NOT had any medication today.

## 2013-08-03 NOTE — ED Notes (Signed)
Patient up with assistance and family member says she is walking about at baseline.  Patient has a shuffling gait and walks very slowly.

## 2013-08-03 NOTE — ED Provider Notes (Signed)
CSN: [id number]     Arrival date & time 08/03/13  1457 History   First MD Initiated Contact with Patient 08/03/13 1514     Chief Complaint  Patient presents with  . Fall     (Consider location/radiation/quality/duration/timing/severity/associated sxs/prior Treatment) Patient is a 80 y.o. female presenting with fall. The history is provided by the patient and a relative.  Fall Pertinent negatives include no chest pain, no abdominal pain, no headaches and no shortness of breath.  pt w hx dm, htn, s/p fall at home just pta today. Was carrying food, turned quickly, and states went down on bottom. States felt like w turning legs gave way. No faintness or dizziness prior to fall. No associated chest pain or discomfort. No sob. No palpitations or sense of fast or irregular heart beat. Denies other recent falls. States inside walks on own, but when walking any distance or outside, walks w walker.  Denies head injury or headache. No neck or back pain from fall, although states has chronic back pain. C/o pain in right shoulder, family states chronic pain, ?chronic poor use/movement of left shoulder, ?worse post fall. No numbness/weakness. Family states mental status has recently been and remains c/w baseline. Eating/drinking well. No recent change in meds. No fever or chills.     Past Medical History  Diagnosis Date  . Diabetes mellitus   . Hypertension   . Coronary artery disease   . Renal disorder   . Herniated lumbar intervertebral disc   . Cataract     cataract surgery scheduled for 03/31/11, 2 - left eye, 1 - right eye  . Renovascular hypertension      s/p left renal artery stent 12/2007.  S/P balloon angioplasty on 02/16/10 for ISR, BP was  controlled well since then.     . Claudication in peripheral vascular disease     02/16/10: Left CIA 9.0x28 Omnilink and REIA 8.0x40 seff expanding Zilver.   right subclavian artery stent 03/18/2008,  . S/P carotid endarterectomy      left carotid  endarterectomy  1991; Stroke and TIA in 1999 with right sided weakness, now with residual right arm weakness  . Stroke     TIA  . Peripheral vascular disease   . Anxiety   . Arthritis   . Pacemaker    Past Surgical History  Procedure Laterality Date  . Appendectomy    . Abdominal hysterectomy    . Ureteral stent placement    . Carotid endarterectomy    . Laminectomy    . Back surgery    . Colonoscopy  07/30/2011    Procedure: COLONOSCOPY;  Surgeon: Inda Castle, MD;  Location: [place];  Service: Endoscopy;  Laterality: N/A;  gi bleed  . Esophagogastroduodenoscopy  07/30/2011    Procedure: ESOPHAGOGASTRODUODENOSCOPY (EGD);  Surgeon: Inda Castle, MD;  Location: [place];  Service: Endoscopy;  Laterality: N/A;  . Pacemaker insertion     Family History  Problem Relation Age of Onset  . Cancer Father    History  Substance Use Topics  . Smoking status: Current Every Day Smoker -- 0.50 packs/day for 65 years    Types: Cigarettes  . Smokeless tobacco: Current User  . Alcohol Use: No   OB History   Grav Para Term Preterm Abortions TAB SAB Ect Mult Living                 Review of Systems  Constitutional: Negative for fever and chills.  HENT: Negative for sore  throat.   Eyes: Negative for redness.  Respiratory: Positive for cough. Negative for shortness of breath.   Cardiovascular: Negative for chest pain.  Gastrointestinal: Negative for vomiting, abdominal pain and diarrhea.  Genitourinary: Negative for dysuria and flank pain.  Musculoskeletal: Negative for neck pain.  Skin: Negative for wound.  Neurological: Negative for weakness, numbness and headaches.  Hematological: Does not bruise/bleed easily.  Psychiatric/Behavioral: Negative for confusion.      Allergies  Peanuts; Norvasc; and Peanut-containing drug products  Home Medications   Prior to Admission medications   Medication Sig Start Date End Date Taking? Authorizing Provider   acetaminophen-codeine (TYLENOL #3) 300-30 MG per tablet Take 1 tablet by mouth every 4 (four) hours as needed for moderate pain. 04/08/13   Eugenie Filler, MD  ALPRAZolam Duanne Moron) 0.5 MG tablet Take 0.5 mg by mouth 3 (three) times daily as needed for anxiety.  05/30/13   Historical Provider, MD  aspirin EC 81 MG tablet Take 81 mg by mouth daily.    Historical Provider, MD  clopidogrel (PLAVIX) 75 MG tablet Take 75 mg by mouth daily. 08/01/13   Historical Provider, MD  ergocalciferol (VITAMIN D2) 50000 UNITS capsule Take 50,000 Units by mouth 2 (two) times a week. Tuesday and friday    Historical Provider, MD  furosemide (LASIX) 20 MG tablet Take 1 tablet (20 mg total) by mouth daily. 06/03/13   Maryann Mikhail, DO  hydrALAZINE (APRESOLINE) 50 MG tablet Take 50 mg by mouth 4 (four) times daily.    Historical Provider, MD  insulin NPH-regular Human (NOVOLIN 70/30) (70-30) 100 UNIT/ML injection Inject 8 Units into the skin 2 (two) times daily with a meal. 04/08/13   Eugenie Filler, MD  isosorbide mononitrate (IMDUR) 120 MG 24 hr tablet Take 1 tablet (120 mg total) by mouth daily. 12/12/12   Annita Brod, MD  LOVAZA 1 G capsule Take 1 g by mouth 2 (two) times daily.  04/12/13   Historical Provider, MD  metoprolol (LOPRESSOR) 100 MG tablet Take 100 mg by mouth 2 (two) times daily.    Historical Provider, MD  potassium chloride SA (K-DUR,KLOR-CON) 20 MEQ tablet Take 1 tablet (20 mEq total) by mouth daily. 06/03/13   Maryann Mikhail, DO  rosuvastatin (CRESTOR) 10 MG tablet Take 10 mg by mouth daily.    Historical Provider, MD  spironolactone (ALDACTONE) 50 MG tablet Take 50 mg by mouth daily.    Historical Provider, MD   BP 197/87  Pulse 60  Temp(Src) 98.3 F (36.8 C) (Oral)  Resp 18  SpO2 98% Physical Exam  Nursing note and vitals reviewed. Constitutional: She is oriented to person, place, and time. She appears well-developed and well-nourished. No distress.  HENT:  Head: Atraumatic.   Mouth/Throat: Oropharynx is clear and moist.  Eyes: Conjunctivae are normal. Pupils are equal, round, and reactive to light. No scleral icterus.  Neck: Normal range of motion. Neck supple. No tracheal deviation present.  No bruit  Cardiovascular: Normal rate, regular rhythm, normal heart sounds and intact distal pulses.   Pulmonary/Chest: Effort normal and breath sounds normal. No respiratory distress. She exhibits no tenderness.  Abdominal: Soft. Normal appearance. She exhibits no distension and no mass. There is no tenderness. There is no rebound and no guarding.  Genitourinary:  No cva tenderness  Musculoskeletal: She exhibits no edema.  Tenderness/pain w movement right shoulder. Not dislocated. Distal pulses palp bil. CTLS spine, non tender, aligned, no step off.   Neurological: She is alert and oriented  to person, place, and time.  Skin: Skin is warm and dry. No rash noted.  Psychiatric: She has a normal mood and affect.    ED Course  Procedures (including critical care time)   Results for orders placed during the hospital encounter of 08/03/13  CBC      Result Value Ref Range   WBC 2.9 (*) 4.0 - 10.5 K/uL   RBC 3.83 (*) 3.87 - 5.11 MIL/uL   Hemoglobin 12.7  12.0 - 15.0 g/dL   HCT 37.5  36.0 - 46.0 %   MCV 97.9  78.0 - 100.0 fL   MCH 33.2  26.0 - 34.0 pg   MCHC 33.9  30.0 - 36.0 g/dL   RDW 13.1  11.5 - 15.5 %   Platelets 153  150 - 400 K/uL  COMPREHENSIVE METABOLIC PANEL      Result Value Ref Range   Sodium 135 (*) 137 - 147 mEq/L   Potassium 4.4  3.7 - 5.3 mEq/L   Chloride 103  96 - 112 mEq/L   CO2 21  19 - 32 mEq/L   Glucose, Bld 462 (*) 70 - 99 mg/dL   BUN 19  6 - 23 mg/dL   Creatinine, Ser 1.47 (*) 0.50 - 1.10 mg/dL   Calcium 8.3 (*) 8.4 - 10.5 mg/dL   Total Protein 5.9 (*) 6.0 - 8.3 g/dL   Albumin 2.7 (*) 3.5 - 5.2 g/dL   AST 79 (*) 0 - 37 U/L   ALT 92 (*) 0 - 35 U/L   Alkaline Phosphatase 85  39 - 117 U/L   Total Bilirubin <0.2 (*) 0.3 - 1.2 mg/dL   GFR  calc non Af Amer 33 (*) >90 mL/min   GFR calc Af Amer 39 (*) >90 mL/min  URINALYSIS, ROUTINE W REFLEX MICROSCOPIC      Result Value Ref Range   Color, Urine YELLOW  YELLOW   APPearance CLEAR  CLEAR   Specific Gravity, Urine 1.019  1.005 - 1.030   pH 5.5  5.0 - 8.0   Glucose, UA >1000 (*) NEGATIVE mg/dL   Hgb urine dipstick NEGATIVE  NEGATIVE   Bilirubin Urine NEGATIVE  NEGATIVE   Ketones, ur NEGATIVE  NEGATIVE mg/dL   Protein, ur 100 (*) NEGATIVE mg/dL   Urobilinogen, UA 0.2  0.0 - 1.0 mg/dL   Nitrite NEGATIVE  NEGATIVE   Leukocytes, UA NEGATIVE  NEGATIVE  URINE MICROSCOPIC-ADD ON      Result Value Ref Range   Squamous Epithelial / LPF FEW (*) RARE   WBC, UA 0-2  <3 WBC/hpf   Bacteria, UA RARE  RARE  CBG MONITORING, ED      Result Value Ref Range   Glucose-Capillary 478 (*) 70 - 99 mg/dL  CBG MONITORING, ED      Result Value Ref Range   Glucose-Capillary 321 (*) 70 - 99 mg/dL   Dg Chest 2 View  08/03/2013   CLINICAL DATA:  Hypertension  EXAM: CHEST  2 VIEW  COMPARISON:  06/02/2013  FINDINGS: Left-sided pacemaker overlies normal cardiac silhouette. No effusion, infiltrate, or pneumothorax. Vascular stent noted the right lung apex. Improved aeration the lung bases. Severe degenerate changes of the right shoulder.  IMPRESSION: 1.  No acute cardiopulmonary process. 2. Slight improved aeration the lung bases compared to prior. Per 3 severe degenerative change in the right shoulder.   Electronically Signed   By: Suzy Bouchard M.D.   On: 08/03/2013 16:19   Dg Shoulder Right  08/03/2013  CLINICAL DATA:  Fall. Right shoulder injury and pain. Decreased range of motion.  EXAM: RIGHT SHOULDER - 2+ VIEW  COMPARISON:  04/11/2008  FINDINGS: No evidence of acute fracture or dislocation. Diffuse osteopenia noted. Severe glenohumeral osteoarthritis is demonstrated. Peripheral vascular calcification also noted.  IMPRESSION: No acute findings.  Severe glenohumeral osteoarthritis.  Osteopenia.    Electronically Signed   By: Earle Gell M.D.   On: 08/03/2013 16:16      MDM   Labs. Xr.    Reviewed nursing notes and prior charts for additional history.   Tylenol po.  Blood sugar high, iv ns bolus, novolog sq.  Pt indicates hasnt taken her normal meds today.  bp high. Pt given dose of her normal bp meds.  Given uncontrolled blood sugars (although not in DKA), uncontrolled/severe htn, acute kidney injury, will admit.    On review prior charts, recent prior admit for similar issues - possibly pointing to need help w home meds, home health eval, pt/family education, close pcp f/u when able to be discharge to avoid recurrent symptoms/recurrent admissions.  [facility] - will [facility].  bp remains high, hr 68, bp 230/90, labetalol iv.   Recheck glucose mildly improved.      discus    Mirna Mires, MD 08/03/13 2012

## 2013-08-03 NOTE — ED Notes (Signed)
Called lab.  They are working on patient's urine.  Will be ready in 5.

## 2013-08-03 NOTE — ED Notes (Signed)
285ml of ice water given to pt per Caren Griffins

## 2013-08-04 DIAGNOSIS — R7989 Other specified abnormal findings of blood chemistry: Secondary | ICD-10-CM

## 2013-08-04 LAB — CBC
HEMATOCRIT: 35.9 % — AB (ref 36.0–46.0)
Hemoglobin: 12 g/dL (ref 12.0–15.0)
MCH: 32.4 pg (ref 26.0–34.0)
MCHC: 33.4 g/dL (ref 30.0–36.0)
MCV: 97 fL (ref 78.0–100.0)
Platelets: 148 10*3/uL — ABNORMAL LOW (ref 150–400)
RBC: 3.7 MIL/uL — ABNORMAL LOW (ref 3.87–5.11)
RDW: 12.9 % (ref 11.5–15.5)
WBC: 4.5 10*3/uL (ref 4.0–10.5)

## 2013-08-04 LAB — COMPREHENSIVE METABOLIC PANEL
ALK PHOS: 78 U/L (ref 39–117)
ALT: 78 U/L — ABNORMAL HIGH (ref 0–35)
AST: 55 U/L — ABNORMAL HIGH (ref 0–37)
Albumin: 2.6 g/dL — ABNORMAL LOW (ref 3.5–5.2)
BUN: 19 mg/dL (ref 6–23)
CHLORIDE: 107 meq/L (ref 96–112)
CO2: 19 mEq/L (ref 19–32)
Calcium: 8.1 mg/dL — ABNORMAL LOW (ref 8.4–10.5)
Creatinine, Ser: 1.04 mg/dL (ref 0.50–1.10)
GFR, EST AFRICAN AMERICAN: 59 mL/min — AB (ref >90)
GFR, EST NON AFRICAN AMERICAN: 50 mL/min — AB (ref >90)
GLUCOSE: 311 mg/dL — AB (ref 70–99)
POTASSIUM: 3.6 meq/L — AB (ref 3.7–5.3)
Sodium: 137 mEq/L (ref 137–147)
Total Protein: 5.6 g/dL — ABNORMAL LOW (ref 6.0–8.3)

## 2013-08-04 LAB — CK TOTAL AND CKMB (NOT AT ARMC)
CK, MB: 2.7 ng/mL (ref 0.3–4.0)
Relative Index: INVALID (ref 0.0–2.5)
Total CK: 50 U/L (ref 7–177)

## 2013-08-04 LAB — MRSA PCR SCREENING: MRSA by PCR: NEGATIVE

## 2013-08-04 MED ORDER — HYDROCODONE-ACETAMINOPHEN 5-325 MG PO TABS
1.0000 | ORAL_TABLET | Freq: Four times a day (QID) | ORAL | Status: DC | PRN
  Administered 2013-08-05 – 2013-08-09 (×7): 1 via ORAL
  Filled 2013-08-04 (×7): qty 1

## 2013-08-04 MED ORDER — HYDRALAZINE HCL 20 MG/ML IJ SOLN
10.0000 mg | Freq: Four times a day (QID) | INTRAMUSCULAR | Status: DC | PRN
  Administered 2013-08-04 – 2013-08-08 (×6): 10 mg via INTRAVENOUS
  Filled 2013-08-04 (×3): qty 1
  Filled 2013-08-04: qty 0.5
  Filled 2013-08-04 (×2): qty 1
  Filled 2013-08-04: qty 0.5
  Filled 2013-08-04: qty 1

## 2013-08-04 MED ORDER — POTASSIUM CHLORIDE CRYS ER 20 MEQ PO TBCR
40.0000 meq | EXTENDED_RELEASE_TABLET | Freq: Every day | ORAL | Status: DC
  Administered 2013-08-05 – 2013-08-06 (×2): 40 meq via ORAL
  Filled 2013-08-04 (×2): qty 2

## 2013-08-04 MED ORDER — HYDRALAZINE HCL 50 MG PO TABS
50.0000 mg | ORAL_TABLET | Freq: Three times a day (TID) | ORAL | Status: DC
  Administered 2013-08-04 – 2013-08-05 (×2): 50 mg via ORAL
  Filled 2013-08-04 (×5): qty 1

## 2013-08-04 NOTE — Progress Notes (Signed)
Patient ID: Carmen [name], female   DOB: [birth year], 80 y.o.   MRN: [id number] [facility] PROGRESS NOTE  IVANNA KOCAK ATF:[id number] DOB: July 06, 1933 DOA: 08/03/2013 PCP: Maximino Greenland, MD  Brief narrative: 80 y.o. Female with HTN, DM, HLD, CKD stage II - III, dementia, presented to the ED after a fall that occurred at home while she was carrying food, turned quickly and fell down on her bottom. Thankfully she was uninjured in fall; however, patient became hypertensive with SBPs persistently in the 220s-240s in the ED.  Principal Problem:   Hypertensive urgency - currently on Labetalol drip  - will plan on transitioning to off - place on Hydralazine scheduled and as needed - pt also on Imdur and we can continue as per home medical regimen  Active Problems:   DM (diabetes mellitus), type 2, uncontrolled, periph vascular complications  - reasonable inpatient control - continue Insulin - add SSI    Retrovascular hypertension status post left renal artery stent 2009 and balloon angioplasty for in-stent restenosis 2011 - continue aspirin, plavix, statin     History of CVA (cerebrovascular accident) with associated mild right upper extremity hemiplegia - PT/OT once more medically stable and transitioned to regular bed    Transaminitis - possibly from rhabdo - will check CK level now an repeat CMET in AM   Renal failure (ARF), acute on chronic - possibly pre renal in etiology and rhabdo after the fall - IVF provided and Cr is trending down, WNL this AM - repeat BMP in AM   Severe malnutrition - SLP, nutrition consultation  - PT/OT eval   Hypokalemia - mild, supplement and repeat BMP in AM  Consultants:  None   Procedures/Studies: Dg Chest 2 View  08/03/2013   No acute cardiopulmonary process. Slight improved aeration the lung bases compared to prior.   Dg Shoulder Right  08/03/2013  No acute findings.  Severe glenohumeral osteoarthritis.  Osteopenia.     Antibiotics:  None  Code Status: Full Family Communication: Pt at bedside Disposition Plan: Keep in SDU   HPI/Subjective: No events overnight.   Objective: Filed Vitals:   08/04/13 1115 08/04/13 1130 08/04/13 1145 08/04/13 1200  BP: 117/34 110/38 120/39 121/41  Pulse: 59 60 60 60  Temp:    96.2 F (35.7 C)  TempSrc:    Axillary  Resp: 18 17 17 18   Height:      Weight:      SpO2: 94% 94% 95% 95%    Intake/Output Summary (Last 24 hours) at 08/04/13 1255 Last data filed at 08/04/13 0600  Gross per 24 hour  Intake 180.88 ml  Output      0 ml  Net 180.88 ml    Exam:   General:  Pt is alert, follows commands appropriately, not in acute distress, cachectic  Cardiovascular: Regular rate and rhythm, no rubs, no gallops  Respiratory: Clear to auscultation bilaterally, no wheezing, diminished breath sounds at bases   Abdomen: Soft, non tender, non distended, bowel sounds present, no guarding  Extremities: No edema, pulses DP and PT palpable bilaterally  Data Reviewed: Basic Metabolic Panel:  Recent Labs Lab 08/03/13 1544 08/04/13 0406  NA 135* 137  K 4.4 3.6*  CL 103 107  CO2 21 19  GLUCOSE 462* 311*  BUN 19 19  CREATININE 1.47* 1.04  CALCIUM 8.3* 8.1*   Liver Function Tests:  Recent Labs Lab 08/03/13 1544 08/04/13 0406  AST 79* 55*  ALT 92* 78*  ALKPHOS 85 78  BILITOT <0.2* <0.2*  PROT 5.9* 5.6*  ALBUMIN 2.7* 2.6*   CBC:  Recent Labs Lab 08/03/13 1544 08/04/13 0406  WBC 2.9* 4.5  HGB 12.7 12.0  HCT 37.5 35.9*  MCV 97.9 97.0  PLT 153 148*   CBG:  Recent Labs Lab 08/03/13 1508 08/03/13 1836 08/03/13 2009 08/03/13 2201  GLUCAP 478* 321* 241* 172*    Recent Results (from the past 240 hour(s))  MRSA PCR SCREENING     Status: None   Collection Time    08/03/13 10:05 PM      Result Value Ref Range Status   MRSA by PCR NEGATIVE  NEGATIVE Final   Comment:            The GeneXpert MRSA Assay (FDA     approved for NASAL  specimens     only), is one component of a     comprehensive MRSA colonization     surveillance program. It is not     intended to diagnose MRSA     infection nor to guide or     monitor treatment for     MRSA infections.     Scheduled Meds: . aspirin EC  81 mg Oral Daily  . atorvastatin  20 mg Oral q1800  . clopidogrel  75 mg Oral Daily  . heparin  5,000 Units Subcutaneous 3 times per day  . hydrALAZINE  50 mg Oral QID  . insulin aspart  0-9 Units Subcutaneous TID WC  . insulin aspart protamine- aspart  8 Units Subcutaneous BID WC  . isosorbide mononitrate  120 mg Oral Daily  . omega-3 acid ethyl esters  1 g Oral BID  . potassium chloride SA  20 mEq Oral Daily  . sodium chloride  3 mL Intravenous Q12H   Continuous Infusions: . labetalol (NORMODYNE) infusion Stopped (08/04/13 1137)     Theodis Blaze, MD  [facility] Pager [phone]  If 7PM-7AM, please contact night-coverage [email or web address] Password [email or web address] 08/04/2013, 12:55 PM   LOS: 1 day

## 2013-08-04 NOTE — Progress Notes (Signed)
Pt on labetalol IV infusion, per orders bolus of a total of 30 mg (10mg  x 3).  Will continue to monitor.

## 2013-08-04 NOTE — Evaluation (Signed)
Clinical/Bedside Swallow Evaluation Patient Details  Name: MARIDEE SLAPE MRN: [id number] Date of Birth: 17-Mar-1933  Today's Date: 08/04/2013 Time: 1430-1500 SLP Time Calculation (min): 30 min  Past Medical History:  Past Medical History  Diagnosis Date  . Diabetes mellitus   . Hypertension   . Coronary artery disease   . Renal disorder   . Herniated lumbar intervertebral disc   . Cataract     cataract surgery scheduled for 03/31/11, 2 - left eye, 1 - right eye  . Renovascular hypertension      s/p left renal artery stent 12/2007.  S/P balloon angioplasty on 02/16/10 for ISR, BP was  controlled well since then.     . Claudication in peripheral vascular disease     02/16/10: Left CIA 9.0x28 Omnilink and REIA 8.0x40 seff expanding [place]il[place].   right subclavian artery stent 03/18/2008,  . S/P carotid endarterectomy      left carotid endarterectomy  1991; Stroke and TIA in 1999 with right sided weakness, now with residual right arm weakness  . Stroke     TIA  . Peripheral vascular disease   . Anxiety   . Arthritis   . Pacemaker    Past Surgical History:  Past Surgical History  Procedure Laterality Date  . Appendectomy    . Abdominal hysterectomy    . Ureteral stent placement    . Carotid endarterectomy    . Laminectomy    . Back surgery    . Colonoscopy  07/30/2011    Procedure: COLONOSCOPY;  Surgeon: Inda Castle, MD;  Location: [facility];  Service: Endoscopy;  Laterality: N/A;  gi bleed  . Esophagogastroduodenoscopy  07/30/2011    Procedure: ESOPHAGOGASTRODUODENOSCOPY (EGD);  Surgeon: Inda Castle, MD;  Location: [facility];  Service: Endoscopy;  Laterality: N/A;  . Pacemaker insertion     HPI:  80 year old female admitted 08/03/13 after a fall at home.  PMH significant for CVA/TIA, dysphagia. CXR revealed nothing acute. BSE ordered to evaluate swallow function and safety. Pt with hx dysphagia after extubation last year.   Assessment / Plan / Recommendation Clinical  Impression  Pt appears to be at baseline level of functioning.  No overt s/s aspiration observed or reported. Pt is used to eating without dentures in, and manages a regular diet and thin liquids at home. Daughter reports she has a good appetite.    Aspiration Risk  Mild    Diet Recommendation Regular;Thin liquid   Liquid Administration via: Straw;Cup Medication Administration: Whole meds with liquid Supervision: Patient able to self feed Compensations: Slow rate;Small sips/bites Postural Changes and/or Swallow Maneuvers: Seated upright 90 degrees    Other  Recommendations Oral Care Recommendations: Oral care BID   Follow Up Recommendations  None    Frequency and Duration        Pertinent Vitals/Pain VSS, 9/10 pain reported (pt indicated "Im always in pain)    SLP Swallow Goals  n/a   Swallow Study Prior Functional Status  Type of Home: House Available Help at Discharge: Family;Available 24 hours/day    General Date of Onset: 08/03/13 HPI: 80 year old female admitted 08/03/13 after a fall at home.  PMH significant for CVA/TIA, dysphagia. CXR revealed nothing acute. BSE ordered to evaluate swallow function and safety. Pt with hx dysphagia after extubation last year. Type of Study: Bedside swallow evaluation Previous Swallow Assessment: BSE September 2014 Diet Prior to this Study: Regular;Thin liquids Temperature Spikes Noted: No Respiratory Status: Room air History of Recent  Intubation: No Behavior/Cognition: Alert;Cooperative;Pleasant mood;Distractible Oral Cavity - Dentition: Edentulous Self-Feeding Abilities: Able to feed self Patient Positioning: Upright in chair Baseline Vocal Quality: Clear Volitional Cough: Strong Volitional Swallow: Able to elicit    Oral/Motor/Sensory Function Overall Oral Motor/Sensory Function: Appears within functional limits for tasks assessed   Ice Chips Ice chips: Not tested   Thin Liquid Thin Liquid: Within functional  limits Presentation: Straw    Nectar Thick Nectar Thick Liquid: Not tested   Honey Thick Honey Thick Liquid: Not tested   Puree Puree: Not tested   Solid   GO   Celia B. Quentin [facility], CCC-SLP [phone] Solid: Within functional limits Presentation: [place] 08/04/2013,3:55 PM

## 2013-08-04 NOTE — Progress Notes (Signed)
[facility], NP made aware of pt pain issues (pt states she has chronic back pain) and pharmacy concerns with Cardene-IV infusion to be given.  New orders received and initiated and NP to f/u with pharmacy.

## 2013-08-04 NOTE — Evaluation (Addendum)
Occupational Therapy Evaluation Patient Details Name: Carmen [name] MRN: [id number] DOB: 08/29/1933 Today's Date: 08/04/2013    History of Present Illness Carmen [name] is a 80 y.o. female who presents to the ED after a fall that occurred while at home.  She was carrying food, turned quickly and fell down on her bottom.  Thankfully she was uninjured in fall; however, patient hypertensive with SBPs persistently in the 220s-240s in the ED.  She denies missing any meds but isnt the greatest historian due to obvious forgetfulness on exam.  Per family her mental status has been and is currently at her baseline   Clinical Impression   This 80 yo female admitted for above presents to acute OT with decreased mobility, increased pain, decreased use of RUE, all affecting pt's ability to take care of herself at the Mod I level that she was pta. She will benefit from acute OT with follow up [place]. Noted that we had an OT order with activity up with assistance and then noted in internal med note from today--PT/OT once pt medically stable and on regular floor. Spoke with nurse Rodena Piety) and she said pt now off of IV BP meds and it is fine to work with her.    Follow Up Recommendations  Home health OT    Equipment Recommendations  3 in 1 bedside comode       Precautions / Restrictions Precautions Precautions: Fall Restrictions Weight Bearing Restrictions: No      Mobility Bed Mobility Overal bed mobility: Needs Assistance Bed Mobility: Supine to Sit     Supine to sit: Mod assist;HOB elevated        Transfers Overall transfer level: Needs assistance Equipment used: 1 person hand held assist Transfers: Sit to/from Stand;Stand Pivot Transfers Sit to Stand: Min assist Stand pivot transfers: Min assist            Balance Overall balance assessment: Needs assistance Sitting-balance support: Feet supported;Single extremity supported Sitting balance-Leahy Scale: Fair     Standing  balance support: Single extremity supported Standing balance-Leahy Scale: Poor                              ADL Overall ADL's : Needs assistance/impaired Eating/Feeding: Set up;Sitting   Grooming: Set up;Sitting   Upper Body Bathing: Set up;Sitting   Lower Body Bathing: Minimal assistance;Sit to/from stand   Upper Body Dressing : Set up;Sitting   Lower Body Dressing: Minimal assistance;Sit to/from stand   Toilet Transfer: Minimal assistance;Stand-pivot;BSC   Toileting- Clothing Manipulation and Hygiene: Total assistance (with min A sit<>stand)         General ADL Comments: Pt requires increased time with all BADLs due to decreased use of RUE and now with painful right knee with WB'ing               Pertinent Vitals/Pain Right knee pain with WB'ing; repositioned and made RN aware.     Hand Dominance Left (due to CVA in 1990's that affected her RUE)   Extremity/Trunk Assessment Upper Extremity Assessment Upper Extremity Assessment: RUE deficits/detail RUE Deficits / Details: basically non-functional per pt, she can hold something in her hand once she places it there, does basically all of her B/D one handed RUE Coordination: decreased fine motor;decreased gross motor           Communication Communication Communication: No difficulties   Cognition Arousal/Alertness: Awake/alert Behavior During Therapy: WFL for tasks assessed/performed Overall  Cognitive Status: Within Functional Limits for tasks assessed                                Home Living Family/patient expects to be discharged to:: Private residence Living Arrangements: Spouse/significant other;Children (son who works) Available Help at Discharge: Family;Available 24 hours/day Type of Home: House Home Access: Stairs to enter [employer] of Steps: 3 Entrance Stairs-Rails: Right Home Layout: One level     Bathroom Shower/Tub: Tub/shower unit;Curtain Shower/tub  characteristics: Curtain       Home Equipment: Clinical cytogeneticist - 2 wheels;Cane - single point;Bedside commode   Additional Comments: Living at home with spouse and son, walked indep without AD except outside she used a walking stick, family assists with in/out of bathtub      Prior Functioning/Environment Level of Independence: Needs assistance  Gait / Transfers Assistance Needed: walks with walking stick outside ADL's / Homemaking Assistance Needed: assisted with buttons; family does housework and cooking        OT Diagnosis: Generalized weakness;Acute pain   OT Problem List: Decreased strength;Decreased range of motion;Impaired balance (sitting and/or standing);Pain   OT Treatment/Interventions: Self-care/ADL training;Balance training;Patient/family education;DME and/or AE instruction    OT Goals(Current goals can be found in the care plan section) Acute Rehab OT Goals OT Goal Formulation: With patient Time For Goal Achievement: 08/11/13 Potential to Achieve Goals: Good  OT Frequency: Min 2X/week              End of Session Equipment Utilized During Treatment: Gait belt Nurse Communication: Mobility status (pain in right knee, had a BM)  Activity Tolerance: Patient tolerated treatment well Patient left: in chair;with call bell/phone within reach;with family/visitor present   Time: 1412-1446 OT Time Calculation (min): 34 min Charges:  OT General Charges $OT Visit: 1 Procedure OT Evaluation $Initial OT Evaluation Tier I: 1 Procedure OT Treatments $Self Care/Home Management : 23-37 mins  Almon Register [phone] 08/04/2013, 3:02 PM

## 2013-08-05 DIAGNOSIS — Z950 Presence of cardiac pacemaker: Secondary | ICD-10-CM

## 2013-08-05 DIAGNOSIS — I169 Hypertensive crisis, unspecified: Secondary | ICD-10-CM | POA: Diagnosis present

## 2013-08-05 LAB — COMPREHENSIVE METABOLIC PANEL
ALT: 72 U/L — ABNORMAL HIGH (ref 0–35)
AST: 42 U/L — AB (ref 0–37)
Albumin: 2.8 g/dL — ABNORMAL LOW (ref 3.5–5.2)
Alkaline Phosphatase: 69 U/L (ref 39–117)
BUN: 23 mg/dL (ref 6–23)
CALCIUM: 9 mg/dL (ref 8.4–10.5)
CO2: 20 meq/L (ref 19–32)
Chloride: 110 mEq/L (ref 96–112)
Creatinine, Ser: 1.25 mg/dL — ABNORMAL HIGH (ref 0.50–1.10)
GFR calc Af Amer: 47 mL/min — ABNORMAL LOW (ref >90)
GFR, EST NON AFRICAN AMERICAN: 40 mL/min — AB (ref >90)
Glucose, Bld: 68 mg/dL — ABNORMAL LOW (ref 70–99)
Potassium: 3.9 mEq/L (ref 3.7–5.3)
SODIUM: 141 meq/L (ref 137–147)
TOTAL PROTEIN: 5.8 g/dL — AB (ref 6.0–8.3)
Total Bilirubin: <0.2 mg/dL — ABNORMAL LOW (ref 0.3–1.2)

## 2013-08-05 LAB — GLUCOSE, CAPILLARY
GLUCOSE-CAPILLARY: 302 mg/dL — AB (ref 70–99)
GLUCOSE-CAPILLARY: 160 mg/dL — AB (ref 70–99)
GLUCOSE-CAPILLARY: 236 mg/dL — AB (ref 70–99)
Glucose-Capillary: 290 mg/dL — ABNORMAL HIGH (ref 70–99)
Glucose-Capillary: 212 mg/dL — ABNORMAL HIGH (ref 70–99)
Glucose-Capillary: 162 mg/dL — ABNORMAL HIGH (ref 70–99)
Glucose-Capillary: 150 mg/dL — ABNORMAL HIGH (ref 70–99)
Glucose-Capillary: 235 mg/dL — ABNORMAL HIGH (ref 70–99)
Glucose-Capillary: 273 mg/dL — ABNORMAL HIGH (ref 70–99)
Glucose-Capillary: 244 mg/dL — ABNORMAL HIGH (ref 70–99)

## 2013-08-05 LAB — CBC
HCT: 39.3 % (ref 36.0–46.0)
Hemoglobin: 13.3 g/dL (ref 12.0–15.0)
MCH: 32.8 pg (ref 26.0–34.0)
MCHC: 33.8 g/dL (ref 30.0–36.0)
MCV: 96.8 fL (ref 78.0–100.0)
PLATELETS: 163 10*3/uL (ref 150–400)
RBC: 4.06 MIL/uL (ref 3.87–5.11)
RDW: 13.2 % (ref 11.5–15.5)
WBC: 4.4 10*3/uL (ref 4.0–10.5)

## 2013-08-05 MED ORDER — NICARDIPINE HCL IN NACL 20-0.86 MG/200ML-% IV SOLN
3.0000 mg/h | INTRAVENOUS | Status: DC
  Administered 2013-08-05: 5 mg/h via INTRAVENOUS
  Administered 2013-08-05: 10 mg/h via INTRAVENOUS
  Filled 2013-08-05 (×3): qty 200

## 2013-08-05 MED ORDER — HYDRALAZINE HCL 20 MG/ML IJ SOLN
10.0000 mg | Freq: Once | INTRAMUSCULAR | Status: AC
  Administered 2013-08-05: 10 mg via INTRAVENOUS
  Filled 2013-08-05: qty 1

## 2013-08-05 MED ORDER — HYDRALAZINE HCL 50 MG PO TABS
50.0000 mg | ORAL_TABLET | Freq: Four times a day (QID) | ORAL | Status: DC
  Administered 2013-08-05 – 2013-08-07 (×7): 50 mg via ORAL
  Filled 2013-08-05 (×12): qty 1

## 2013-08-05 NOTE — Evaluation (Signed)
Physical Therapy Evaluation Patient Details Name: Carmen [name] MRN: [id number] DOB: 1933-12-16 Today's Date: 08/05/2013   History of Present Illness  Carmen [name] is a 80 y.o. female who presents to the ED after a fall that occurred while at home.  She was carrying food, turned quickly and fell down on her bottom.  Thankfully she was uninjured in fall; however, patient hypertensive with SBPs persistently in the 220s-240s in the ED.  Sto obvious forgetfulness on exam.  Per family her mental status is at baseline.  Clinical Impression  Pt able to mobilize to recliner per OK from RN. Pt will benefit from PT to address problems listed in note. No family present to discuss DC PLAN.   Follow Up Recommendations Home health PT    Equipment Recommendations  None recommended by PT    Recommendations for Other Services       Precautions / Restrictions Precautions Precautions: Fall      Mobility  Bed Mobility Overal bed mobility: Needs Assistance Bed Mobility: Supine to Sit;Rolling Rolling: Min assist   Supine to sit: Mod assist;HOB elevated        Transfers Overall transfer level: Needs assistance Equipment used: Rolling walker (2 wheeled) Transfers: Sit to/from [facility] Sit to Stand: Min assist Stand pivot transfers: Mod assist       General transfer comment: extra time  to pivot to [facility],   Ambulation/Gait Ambulation/Gait assistance: Min assist Ambulation Distance (Feet): 5 Feet Assistive device: Rolling walker (2 wheeled)       General Gait Details: from [facility] to recliner, cues for safety, hand palcement  Stairs            Wheelchair Mobility    Modified Rankin (Stroke Patients Only)       Balance Overall balance assessment: Needs assistance Sitting-balance support: Feet supported;Single extremity supported Sitting balance-Leahy Scale: Fair     Standing balance support: During functional activity;Single extremity  supported Standing balance-Leahy Scale: Poor Standing balance comment: did stand and atempt to provide self pericare.                             Pertinent Vitals/Pain 169/58    Home Living Family/patient expects to be discharged to:: Private residence Living Arrangements: Spouse/significant other;Children Available Help at Discharge: Family;Available 24 hours/day Type of Home: House Home Access: Stairs to enter Entrance Stairs-Rails: Right Entrance Stairs-Number of Steps: 3 Home Layout: One level Home Equipment: Clinical cytogeneticist - 2 wheels;Cane - single point;Bedside commode Additional Comments: Living at home with spouse and son, walked indep without AD except outside she used a walking stick, family assists with in/out of bathtub    Prior Function Level of Independence: Needs assistance   Gait / Transfers Assistance Needed: walks with walking stick outside  ADL's / Homemaking Assistance Needed: assisted with buttons; family does housework and cooking  Comments: family does housework and cooking     Journalist, newspaper        Extremity/Trunk Assessment   Upper Extremity Assessment: Defer to OT evaluation           Lower Extremity Assessment: Generalized weakness      Cervical / Trunk Assessment: Kyphotic  Communication   Communication: No difficulties  Cognition Arousal/Alertness: Awake/alert Behavior During Therapy: WFL for tasks assessed/performed Overall Cognitive Status:  H/O cognitive changes  General Comments      Exercises        Assessment/Plan    PT Assessment Patient needs continued PT services  PT Diagnosis Difficulty walking;Generalized weakness   PT Problem List Decreased strength;Decreased activity tolerance;Decreased balance;Decreased mobility;Decreased knowledge of use of DME;Decreased safety awareness;Decreased knowledge of precautions  PT Treatment Interventions DME instruction;Gait  training;Functional mobility training;Therapeutic activities;Therapeutic exercise;Patient/family education   PT Goals (Current goals can be found in the Care Plan section) Acute Rehab PT Goals Patient Stated Goal: i want to get up. PT Goal Formulation: With patient Time For Goal Achievement: 08/19/13 Potential to Achieve Goals: Good    Frequency Min 3X/week   Barriers to discharge        Co-evaluation               End of Session Equipment Utilized During Treatment: Gait belt Activity Tolerance: Patient tolerated treatment well Patient left:  (chair alarm pad not available, Nursing to get one) Nurse Communication: Mobility status         Time: 4967-5916 PT Time Calculation (min): 29 min   Charges:     PT Treatments $Therapeutic Activity: 23-37 mins   PT G Codes:          [name] 08/05/2013, 1:33 PM Tresa Endo PT [phone]

## 2013-08-05 NOTE — Progress Notes (Signed)
Patient ID: Carmen [name], female   DOB: Feb 08, 1934, 80 y.o.   MRN: [id number] [facility] PROGRESS NOTE  Carmen [name] GBT:[id number] DOB: Jul 12, 1933 DOA: 08/03/2013 PCP: Maximino Greenland, MD  Brief narrative:  80 y.o. Female with HTN, DM, HLD, CKD stage II - III, dementia, presented to the ED after a fall that occurred at home while she was carrying food, turned quickly and fell down on her bottom. Thankfully she was uninjured in fall; however, patient became hypertensive with SBPs persistently in the 220s-240s in the ED.   Principal Problem:  Hypertensive urgency  - currently off Labetalol drip since 5/30 - BP still labile but better over the past 24 hours - continue Imdur and Hydralazine but will increase the frequency on Hydralazine to QID, also continue Hydralazine as needed  Active Problems:  DM (diabetes mellitus), type 2, uncontrolled, periph vascular complications  - reasonable inpatient control  - continue Insulin  - add SSI  Retrovascular hypertension status post left renal artery stent 2009 and balloon angioplasty for in-stent restenosis 2011  - continue aspirin, plavix, statin  History of CVA (cerebrovascular accident) with associated mild right upper extremity hemiplegia  - PT/OT once more medically stable and transitioned to regular bed  Transaminitis  - possibly from rhabdo  - LFT's trending down  Renal failure (ARF), acute on chronic  - possibly pre renal in etiology and rhabdo after the fall  - IVF provided, Cr slightly up from yesterday, encourage PO intake  - repeat BMP in AM  Severe malnutrition  - SLP, nutrition consultation appreciated  - PT/OT eval  Hypokalemia  - supplemented and WNL this AM  Consultants:  None  Procedures/Studies:  Dg Chest 2 View 08/03/2013 No acute cardiopulmonary process. Slight improved aeration the lung bases compared to prior.  Dg Shoulder Right 08/03/2013 No acute findings. Severe glenohumeral osteoarthritis. Osteopenia.   Antibiotics:  None  Code Status: Full  Family Communication: Pt at bedside  Disposition Plan: Keep in SDU    HPI/Subjective: No events overnight.   Objective: Filed Vitals:   08/05/13 0600 08/05/13 0629 08/05/13 0700 08/05/13 0800  BP: 169/54 169/54    Pulse: 68  74 70  Temp:    98.8 F (37.1 C)  TempSrc:    Oral  Resp: 19  18 18   Height:      Weight:      SpO2: 95%  94% 94%    Intake/Output Summary (Last 24 hours) at 08/05/13 0849 Last data filed at 08/04/13 2100  Gross per 24 hour  Intake 193.38 ml  Output    750 ml  Net -556.62 ml    Exam:   General:  Pt is alert, follows commands appropriately, not in acute distress  Cardiovascular: Regular rate and rhythm, no rubs, no gallops  Respiratory: Clear to auscultation bilaterally, no wheezing, no crackles, no rhonchi  Abdomen: Soft, non tender, non distended, bowel sounds present, no guarding  Extremities: No edema, pulses DP and PT palpable bilaterally  Data Reviewed: Basic Metabolic Panel:  Recent Labs Lab 08/03/13 1544 08/04/13 0406 08/05/13 0349  NA 135* 137 141  K 4.4 3.6* 3.9  CL 103 107 110  CO2 21 19 20   GLUCOSE 462* 311* 68*  BUN 19 19 23   CREATININE 1.47* 1.04 1.25*  CALCIUM 8.3* 8.1* 9.0   Liver Function Tests:  Recent Labs Lab 08/03/13 1544 08/04/13 0406 08/05/13 0349  AST 79* 55* 42*  ALT 92* 78* 72*  ALKPHOS 85 78 69  BILITOT <0.2* <0.2* <0.2*  PROT 5.9* 5.6* 5.8*  ALBUMIN 2.7* 2.6* 2.8*   CBC:  Recent Labs Lab 08/03/13 1544 08/04/13 0406 08/05/13 0349  WBC 2.9* 4.5 4.4  HGB 12.7 12.0 13.3  HCT 37.5 35.9* 39.3  MCV 97.9 97.0 96.8  PLT 153 148* 163   Cardiac Enzymes:  Recent Labs Lab 08/04/13 1413  CKTOTAL 50  CKMB 2.7   CBG:  Recent Labs Lab 08/04/13 0758 08/04/13 1150 08/04/13 1550 08/04/13 2206 08/05/13 0806  GLUCAP 290* 302* 212* 162* 150*    Recent Results (from the past 240 hour(s))  MRSA PCR SCREENING     Status: None   Collection Time     08/03/13 10:05 PM      Result Value Ref Range Status   MRSA by PCR NEGATIVE  NEGATIVE Final   Comment:            The GeneXpert MRSA Assay (FDA     approved for NASAL specimens     only), is one component of a     comprehensive MRSA colonization     surveillance program. It is not     intended to diagnose MRSA     infection nor to guide or     monitor treatment for     MRSA infections.     Scheduled Meds: . aspirin EC  81 mg Oral Daily  . atorvastatin  20 mg Oral q1800  . clopidogrel  75 mg Oral Daily  . heparin  5,000 Units Subcutaneous 3 times per day  . hydrALAZINE  50 mg Oral 3 times per day  . insulin aspart  0-9 Units Subcutaneous TID WC  . insulin aspart protamine- aspart  8 Units Subcutaneous BID WC  . isosorbide mononitrate  120 mg Oral Daily  . omega-3 acid ethyl esters  1 g Oral BID  . potassium chloride SA  40 mEq Oral Daily  . sodium chloride  3 mL Intravenous Q12H   Continuous Infusions:    Theodis Blaze, MD  [facility] Pager [phone]  If 7PM-7AM, please contact night-coverage [email or web address] Password [email or web address] 08/05/2013, 8:49 AM   LOS: 2 days

## 2013-08-06 DIAGNOSIS — E43 Unspecified severe protein-calorie malnutrition: Secondary | ICD-10-CM | POA: Insufficient documentation

## 2013-08-06 LAB — BASIC METABOLIC PANEL
BUN: 21 mg/dL (ref 6–23)
CALCIUM: 8.9 mg/dL (ref 8.4–10.5)
CHLORIDE: 107 meq/L (ref 96–112)
CO2: 17 mEq/L — ABNORMAL LOW (ref 19–32)
CREATININE: 1.14 mg/dL — AB (ref 0.50–1.10)
GFR calc non Af Amer: 45 mL/min — ABNORMAL LOW (ref >90)
GFR, EST AFRICAN AMERICAN: 52 mL/min — AB (ref >90)
Glucose, Bld: 117 mg/dL — ABNORMAL HIGH (ref 70–99)
Potassium: 5.5 mEq/L — ABNORMAL HIGH (ref 3.7–5.3)
Sodium: 135 mEq/L — ABNORMAL LOW (ref 137–147)

## 2013-08-06 LAB — GLUCOSE, CAPILLARY
Glucose-Capillary: 173 mg/dL — ABNORMAL HIGH (ref 70–99)
Glucose-Capillary: 188 mg/dL — ABNORMAL HIGH (ref 70–99)
Glucose-Capillary: 279 mg/dL — ABNORMAL HIGH (ref 70–99)
Glucose-Capillary: 207 mg/dL — ABNORMAL HIGH (ref 70–99)

## 2013-08-06 LAB — CBC
HEMATOCRIT: 39 % (ref 36.0–46.0)
Hemoglobin: 13.5 g/dL (ref 12.0–15.0)
MCH: 33 pg (ref 26.0–34.0)
MCHC: 34.6 g/dL (ref 30.0–36.0)
MCV: 95.4 fL (ref 78.0–100.0)
Platelets: 183 10*3/uL (ref 150–400)
RBC: 4.09 MIL/uL (ref 3.87–5.11)
RDW: 13.3 % (ref 11.5–15.5)
WBC: 4.5 10*3/uL (ref 4.0–10.5)

## 2013-08-06 MED ORDER — CLONIDINE HCL 0.2 MG PO TABS
0.2000 mg | ORAL_TABLET | Freq: Two times a day (BID) | ORAL | Status: DC
  Administered 2013-08-06 (×2): 0.2 mg via ORAL
  Filled 2013-08-06 (×4): qty 1

## 2013-08-06 MED ORDER — NICARDIPINE HCL IN NACL 40-0.83 MG/200ML-% IV SOLN
3.0000 mg/h | INTRAVENOUS | Status: DC
  Administered 2013-08-06 (×2): 10 mg/h via INTRAVENOUS
  Filled 2013-08-06 (×3): qty 200

## 2013-08-06 NOTE — Progress Notes (Signed)
Patient ID: Carmen [name], female   DOB: [birth year], 80 y.o.   MRN: [id number]  [facility] PROGRESS NOTE  Carmen [name] YQM:[id number] DOB: [birth year] DOA: 08/03/2013 PCP: Maximino Greenland, MD  Brief narrative:  80 y.o. Female with HTN, DM, HLD, CKD stage II - III, dementia, presented to the ED after a fall that occurred at home while she was carrying food, turned quickly and fell down on her bottom. Thankfully she was uninjured in fall; however, patient became hypertensive with SBPs persistently in the 220s-240s in the ED.   Principal Problem:  Hypertensive urgency  - currently off Labetalol drip since 5/30, persistently elevated BP with SBP in 220's and higher - placed on Cardene drip 5/31 and still requiring titration  - continue hydralazine, Imdur, add Clonidine 0.2 mg BID PO - keep in SDU  Active Problems:  DM (diabetes mellitus), type 2, uncontrolled, periph vascular complications  - reasonable inpatient control  - continue Insulin  - add SSI  Retrovascular hypertension status post left renal artery stent 2009 and balloon angioplasty for in-stent restenosis 2011  - continue aspirin, plavix, statin  History of CVA (cerebrovascular accident) with associated mild right upper extremity hemiplegia  - PT/OT once pt able to tolerate and participate  Transaminitis  - possibly from rhabdo  - LFT's trending down  Renal failure (ARF), acute on chronic  - possibly pre renal in etiology and rhabdo after the fall  - IVF provided, Cr trending down - repeat BMP in AM  Severe malnutrition  - SLP, nutrition consultation appreciated  - liberalize the diet as pt is cachectic  Hypokalemia  - supplemented and slightly high this AM - stop supplementing and repeat BMP in AM  Consultants:  None  Procedures/Studies:  Dg Chest 2 View 08/03/2013 No acute cardiopulmonary process. Slight improved aeration the lung bases compared to prior.  Dg Shoulder Right 08/03/2013 No acute findings. Severe  glenohumeral osteoarthritis. Osteopenia.  Antibiotics:  None  Code Status: Full  Family Communication: Pt at bedside  Disposition Plan: Keep in SDU    HPI/Subjective: No events overnight.   Objective: Filed Vitals:   08/06/13 1300 08/06/13 1315 08/06/13 1330 08/06/13 1345  BP: 152/47 141/44 165/57 153/56  Pulse: 66 67 69 66  Temp:      TempSrc:      Resp: 20 19 20 24   Height:      Weight:      SpO2: 93% 94% 95% 95%    Intake/Output Summary (Last 24 hours) at 08/06/13 1445 Last data filed at 08/06/13 1400  Gross per 24 hour  Intake   1602 ml  Output    325 ml  Net   1277 ml    Exam:   General:  Pt is alert, follows commands appropriately, not in acute distress  Cardiovascular: Regular rate and rhythm, SEM 3/6, no rubs, no gallops  Respiratory: Clear to auscultation bilaterally, no wheezing, diminished breath sounds at bases  Abdomen: Soft, non tender, non distended, bowel sounds present, no guarding  Extremities: No edema, pulses DP and PT palpable bilaterally  Data Reviewed: Basic Metabolic Panel:  Recent Labs Lab 08/03/13 1544 08/04/13 0406 08/05/13 0349 08/06/13 0337  NA 135* 137 141 135*  K 4.4 3.6* 3.9 5.5*  CL 103 107 110 107  CO2 21 19 20  17*  GLUCOSE 462* 311* 68* 117*  BUN 19 19 23 21   CREATININE 1.47* 1.04 1.25* 1.14*  CALCIUM 8.3* 8.1* 9.0 8.9   Liver Function Tests:  Recent Labs Lab 08/03/13 1544 08/04/13 0406 08/05/13 0349  AST 79* 55* 42*  ALT 92* 78* 72*  ALKPHOS 85 78 69  BILITOT <0.2* <0.2* <0.2*  PROT 5.9* 5.6* 5.8*  ALBUMIN 2.7* 2.6* 2.8*   CBC:  Recent Labs Lab 08/03/13 1544 08/04/13 0406 08/05/13 0349 08/06/13 0337  WBC 2.9* 4.5 4.4 4.5  HGB 12.7 12.0 13.3 13.5  HCT 37.5 35.9* 39.3 39.0  MCV 97.9 97.0 96.8 95.4  PLT 153 148* 163 183   Cardiac Enzymes:  Recent Labs Lab 08/04/13 1413  CKTOTAL 50  CKMB 2.7   CBG:  Recent Labs Lab 08/05/13 1612 08/05/13 1739 08/05/13 2218 08/06/13 0746  08/06/13 1208  GLUCAP 273* 244* 236* 173* 188*    Recent Results (from the past 240 hour(s))  MRSA PCR SCREENING     Status: None   Collection Time    08/03/13 10:05 PM      Result Value Ref Range Status   MRSA by PCR NEGATIVE  NEGATIVE Final   Comment:            The GeneXpert MRSA Assay (FDA     approved for NASAL specimens     only), is one component of a     comprehensive MRSA colonization     surveillance program. It is not     intended to diagnose MRSA     infection nor to guide or     monitor treatment for     MRSA infections.     Scheduled Meds: . aspirin EC  81 mg Oral Daily  . atorvastatin  20 mg Oral q1800  . cloNIDine  0.2 mg Oral BID  . clopidogrel  75 mg Oral Daily  . heparin  5,000 Units Subcutaneous 3 times per day  . hydrALAZINE  50 mg Oral 4 times per day  . insulin aspart  0-9 Units Subcutaneous TID WC  . insulin aspart protamine- aspart  8 Units Subcutaneous BID WC  . isosorbide mononitrate  120 mg Oral Daily  . omega-3 acid ethyl esters  1 g Oral BID  . potassium chloride SA  40 mEq Oral Daily  . sodium chloride  3 mL Intravenous Q12H   Continuous Infusions: . niCARDipine 2.5 mg/hr (08/06/13 1410)   Theodis Blaze, MD  [facility] Pager [phone]  If 7PM-7AM, please contact night-coverage [email or web address] Password [email or web address] 08/06/2013, 2:45 PM   LOS: 3 days

## 2013-08-06 NOTE — Progress Notes (Signed)
Physical Therapy Treatment Patient Details Name: Carmen [name] MRN: [id number] DOB: [birth year] Today's Date: 08/06/2013    History of Present Illness MARBETH SMEDLEY is a 80 y.o. female who presents to the ED after a fall that occurred while at home.  She was carrying food, turned quickly and fell down on her bottom.  Thankfully she was uninjured in fall; however, patient hypertensive with SBPs persistently in the 220s-240s in the ED.  She denies missing any meds but isnt the greatest historian due to obvious forgetfulness on exam.  Per family her mental status has been and is currently at her baseline    PT Comments    Progressing slowly with mobility. Continues to require Min assist. Pt tolerated session well.   Follow Up Recommendations  Home health PT     Equipment Recommendations       Recommendations for Other Services OT consult     Precautions / Restrictions Precautions Precautions: Fall Restrictions Weight Bearing Restrictions: No    Mobility  Bed Mobility Overal bed mobility: Needs Assistance Bed Mobility: Supine to Sit           General bed mobility comments: Assist for trunk to upright and to scoot to EOB. Pt has no functional use of R UE.   Transfers Overall transfer level: Needs assistance   Transfers: Sit to/from Stand Sit to Stand: Min assist;From elevated surface Stand pivot transfers: Min assist       General transfer comment: Increased time to rise to standing. Assist to rise, stabilize, control descent.   Ambulation/Gait Ambulation/Gait assistance: Min assist Ambulation Distance (Feet): 100 Feet Assistive device:  (pt used IV pole for support) Gait Pattern/deviations: Decreased stride length;Step-through pattern;Decreased step length - left;Decreased step length - right     General Gait Details: assist to stabilize throughout ambulation.    Stairs            Wheelchair Mobility    Modified Rankin (Stroke Patients Only)       Balance                                    Cognition Arousal/Alertness: Awake/alert Behavior During Therapy: WFL for tasks assessed/performed Overall Cognitive Status: Within Functional Limits for tasks assessed                      Exercises      General Comments        Pertinent Vitals/Pain No c/o pain    Home Living                      Prior Function            PT Goals (current goals can now be found in the care plan section) Progress towards PT goals: Progressing toward goals    Frequency  Min 3X/week    PT Plan Current plan remains appropriate    Co-evaluation             End of Session Equipment Utilized During Treatment: Gait belt Activity Tolerance: Patient tolerated treatment well Patient left: in bed;with call bell/phone within reach;with bed alarm set     Time: 1410-1428 PT Time Calculation (min): 18 min  Charges:  $Gait Training: 8-22 mins                    G Codes:  Weston Anna, MPT Pager: [phone]

## 2013-08-06 NOTE — Progress Notes (Signed)
[id number] Rosana Hoes, RN, BSN, CCM  [phone]  Chart Reviewed for discharge and hospital needs.  Discharge needs at time of review: None present will follow for needs.  Review of patient progress due on [id number].

## 2013-08-06 NOTE — Progress Notes (Signed)
I have reviewed and agree with nutrition assessment completed by Avel Peace, [place] Clinical Dietitian [phone]

## 2013-08-06 NOTE — Progress Notes (Signed)
INITIAL NUTRITION ASSESSMENT  DOCUMENTATION CODES Per approved criteria  -Severe malnutrition in the context of acute illness or injury   Pt meets criteria for severe MALNUTRITION in the context of acute illness/injury as evidenced by moderate depletion of muscle mass and weight loss of 13% x 2 months.   INTERVENTION: Downgrade to Dysphagia 3 diet Downgrade to Carb Modified Diet  RD to continue to follow nutrition care plan  NUTRITION DIAGNOSIS: Biting/Chewing (Masticatory) Difficulty related to poor dentition as evidenced by pt report of difficulty chewing tough meats.    Goal: Pt to meet >/=90% of estimated nutrition needs   Monitor:  PO intake, weight trend, labs   Reason for Assessment: MD consult   80 y.o. female  Admitting Dx: Hypertensive urgency  ASSESSMENT: Patient is a 80 y.o. female who presents to the ED after a fall that occurred while at home. She was carrying food, turned quickly and fell down on her bottom. Thankfully she was uninjured in fall. PMHx significant for DM, HTN, CAD, Renal disorder, CVA.   -Pt states her normal weight is 103 lbs  -Per Epic chart review, pt has lost 13 lbs x 2 months (11%), which is severe for time frame -Pt reports that she knows how to follow a diabetic diet at home, but "I eat corn bread and biscuits and I'm not supposed to"  -Pt reports that her son cooks for her -Pt has a good appetite and eats 3 meals/day at home including things like collard greens, cabbage, and steak  -Pt drinks water and diet coke at home  -Due to elevated blood sugars and good appetite (eating 100%), pt may benefit from a carb modified diet  -Pt states that if her meats were chopped up for her, it would be easier for her to chew   Nutrition Focused Physical Exam:  Subcutaneous Fat:  Orbital Region: wnl Upper Arm Region: wnl Thoracic and Lumbar Region: n/a   Muscle:  Temple Region: wnl Clavicle Bone Region: wnl Clavicle and Acromion Bone Region:  wnl Scapular Bone Region: n/a  Dorsal Hand: n/a  Patellar Region: moderate depletion  Anterior Thigh Region: moderate depletion  Posterior Calf Region: moderate depletion   Edema: none   Low sodium Elevated potassium, Cr   Height: Ht Readings from Last 1 Encounters:  08/05/13 5\' 4"  (1.626 m)    Weight: Wt Readings from Last 1 Encounters:  08/06/13 109 lb 2 oz (49.5 kg)    Ideal Body Weight: 54.5 kg   % Ideal Body Weight: 91%   Wt Readings from Last 10 Encounters:  08/06/13 109 lb 2 oz (49.5 kg)  06/03/13 122 lb 1.6 oz (55.384 kg)  04/17/13 98 lb (44.453 kg)  04/07/13 108 lb 9.6 oz (49.261 kg)  12/12/12 116 lb 11.2 oz (52.935 kg)  08/16/12 140 lb 10.5 oz (63.8 kg)  05/29/12 112 lb (50.803 kg)  05/29/12 112 lb (50.803 kg)  04/30/12 116 lb 13.5 oz (53 kg)  03/04/12 110 lb 7.2 oz (50.1 kg)    Usual Body Weight: 103 lbs, per pt   % Usual Body Weight: 106%   BMI:  Body mass index is 18.72 kg/(m^2)., Normal   Estimated Nutritional Needs: Kcal: 1300 - 1500  Protein: 55 - 65 grams  Fluid: >/= 1.5 L /day   Skin: WDL   Diet Order: General  EDUCATION NEEDS: -No education needs identified at this time   Intake/Output Summary (Last 24 hours) at 08/06/13 1026 Last data filed at 08/06/13 1000  Gross per 24 hour  Intake   1647 ml  Output    575 ml  Net   1072 ml    Last BM: 6/1    Labs:   Recent Labs Lab 08/04/13 0406 08/05/13 0349 08/06/13 0337  NA 137 141 135*  K 3.6* 3.9 5.5*  CL 107 110 107  CO2 19 20 17*  BUN 19 23 21   CREATININE 1.04 1.25* 1.14*  CALCIUM 8.1* 9.0 8.9  GLUCOSE 311* 68* 117*    CBG (last 3)   Recent Labs  08/05/13 1612 08/05/13 1739 08/05/13 2218  GLUCAP 273* 244* 236*    Scheduled Meds: . aspirin EC  81 mg Oral Daily  . atorvastatin  20 mg Oral q1800  . clopidogrel  75 mg Oral Daily  . heparin  5,000 Units Subcutaneous 3 times per day  . hydrALAZINE  50 mg Oral 4 times per day  . insulin aspart  0-9 Units  Subcutaneous TID WC  . insulin aspart protamine- aspart  8 Units Subcutaneous BID WC  . isosorbide mononitrate  120 mg Oral Daily  . omega-3 acid ethyl esters  1 g Oral BID  . potassium chloride SA  40 mEq Oral Daily  . sodium chloride  3 mL Intravenous Q12H    Continuous Infusions: . niCARDipine 7.5 mg/hr (08/06/13 0636)    Past Medical History  Diagnosis Date  . Diabetes mellitus   . Hypertension   . Coronary artery disease   . Renal disorder   . Herniated lumbar intervertebral disc   . Cataract     cataract surgery scheduled for 03/31/11, 2 - left eye, 1 - right eye  . Renovascular hypertension      s/p left renal artery stent 12/2007.  S/P balloon angioplasty on 02/16/10 for ISR, BP was  controlled well since then.     . Claudication in peripheral vascular disease     02/16/10: Left CIA 9.0x28 Omnilink and REIA 8.0x40 seff expanding [place].   right subclavian artery stent 03/18/2008,  . S/P carotid endarterectomy      left carotid endarterectomy  1991; Stroke and TIA in 1999 with right sided weakness, now with residual right arm weakness  . Stroke     TIA  . Peripheral vascular disease   . Anxiety   . Arthritis   . Pacemaker     Past Surgical History  Procedure Laterality Date  . Appendectomy    . Abdominal hysterectomy    . Ureteral stent placement    . Carotid endarterectomy    . Laminectomy    . Back surgery    . Colonoscopy  07/30/2011    Procedure: COLONOSCOPY;  Surgeon: Inda Castle, MD;  Location: [facility];  Service: Endoscopy;  Laterality: N/A;  gi bleed  . Esophagogastroduodenoscopy  07/30/2011    Procedure: ESOPHAGOGASTRODUODENOSCOPY (EGD);  Surgeon: Inda Castle, MD;  Location: [facility];  Service: Endoscopy;  Laterality: N/A;  . Pacemaker insertion      Carrolyn Leigh, BS Dietetic Intern Pager: [phone]

## 2013-08-07 LAB — BASIC METABOLIC PANEL
BUN: 21 mg/dL (ref 6–23)
CO2: 17 meq/L — AB (ref 19–32)
CREATININE: 1.02 mg/dL (ref 0.50–1.10)
Calcium: 8.8 mg/dL (ref 8.4–10.5)
Chloride: 105 mEq/L (ref 96–112)
GFR calc Af Amer: 60 mL/min — ABNORMAL LOW (ref >90)
GFR calc non Af Amer: 52 mL/min — ABNORMAL LOW (ref >90)
GLUCOSE: 151 mg/dL — AB (ref 70–99)
Potassium: 4.9 mEq/L (ref 3.7–5.3)
SODIUM: 132 meq/L — AB (ref 137–147)

## 2013-08-07 LAB — GLUCOSE, CAPILLARY
GLUCOSE-CAPILLARY: 214 mg/dL — AB (ref 70–99)
GLUCOSE-CAPILLARY: 100 mg/dL — AB (ref 70–99)
Glucose-Capillary: 179 mg/dL — ABNORMAL HIGH (ref 70–99)
Glucose-Capillary: 164 mg/dL — ABNORMAL HIGH (ref 70–99)

## 2013-08-07 LAB — CBC
HCT: 35.4 % — ABNORMAL LOW (ref 36.0–46.0)
HEMOGLOBIN: 12 g/dL (ref 12.0–15.0)
MCH: 32.8 pg (ref 26.0–34.0)
MCHC: 33.9 g/dL (ref 30.0–36.0)
MCV: 96.7 fL (ref 78.0–100.0)
Platelets: 162 10*3/uL (ref 150–400)
RBC: 3.66 MIL/uL — AB (ref 3.87–5.11)
RDW: 13.6 % (ref 11.5–15.5)
WBC: 3.7 10*3/uL — ABNORMAL LOW (ref 4.0–10.5)

## 2013-08-07 MED ORDER — METOPROLOL TARTRATE 25 MG PO TABS
25.0000 mg | ORAL_TABLET | Freq: Two times a day (BID) | ORAL | Status: DC
  Administered 2013-08-07 – 2013-08-09 (×4): 25 mg via ORAL
  Filled 2013-08-07 (×5): qty 1

## 2013-08-07 MED ORDER — HYDRALAZINE HCL 50 MG PO TABS
100.0000 mg | ORAL_TABLET | Freq: Four times a day (QID) | ORAL | Status: DC
  Administered 2013-08-07 – 2013-08-09 (×9): 100 mg via ORAL
  Filled 2013-08-07 (×12): qty 2

## 2013-08-07 MED ORDER — CLONIDINE HCL 0.2 MG PO TABS
0.2000 mg | ORAL_TABLET | Freq: Three times a day (TID) | ORAL | Status: DC
  Administered 2013-08-07 – 2013-08-09 (×7): 0.2 mg via ORAL
  Filled 2013-08-07 (×9): qty 1

## 2013-08-07 NOTE — Progress Notes (Signed)
OT Cancellation Note  Patient Details Name: Carmen [name] MRN: [id number] DOB: [birth year]   Cancelled Treatment:    Reason Eval/Treat Not Completed: Fatigue/lethargy limiting ability to participate. Pt had just moved rooms and is now eating lunch. Will check on pt in the morning. Thanks,  [name] 08/07/2013, 11:59 AM

## 2013-08-07 NOTE — Progress Notes (Addendum)
Patient ID: Carmen [name], female   DOB: 1934-01-23, 80 y.o.   MRN: [id number]  [facility] PROGRESS NOTE  Carmen [name] JME:[id number] DOB: [birth year] DOA: 08/03/2013 PCP: Maximino Greenland, MD  Brief narrative:  80 y.o. Female with HTN, DM, HLD, CKD stage II - III, dementia, presented to the ED after a fall that occurred at home while she was carrying food, turned quickly and fell down on her bottom. Thankfully she was uninjured in fall; however, patient became hypertensive with SBPs persistently in the 220s-240s in the ED.   Principal Problem:  Hypertensive urgency  - off Labetalol drip since 5/30, persistently elevated BP with SBP in 220's and requiring placement on Cardene drip 05/31 - off Cardene drip since 06/01, SBP in 170 -180's off Cardene drip - continue hydralazine, Imdur, add Clonidine 0.2 mg BID PO - add metoprolol and increase the dose of Hydralazine from 50 mg to 100 mg   - transfer to telemetry bed  Active Problems:  DM (diabetes mellitus), type 2, uncontrolled, periph vascular complications  - reasonable inpatient control  - continue Insulin  - add SSI  Retrovascular hypertension status post left renal artery stent 2009 and balloon angioplasty for in-stent restenosis 2011  - continue aspirin, plavix, statin  History of CVA (cerebrovascular accident) with associated mild right upper extremity hemiplegia  - PT/OT while inpatient  - home health PT  Transaminitis  - possibly from rhabdo  - LFT's trending down  Renal failure (ARF), acute on chronic  - possibly pre renal in etiology and rhabdo after the fall  - IVF provided, Cr trending down and now WNL  - repeat BMP in AM  Severe malnutrition  - SLP, nutrition consultation appreciated  - liberalize the diet as pt is cachectic  Hypokalemia  - supplemented and WNL this AM  Consultants:  None  Procedures/Studies:  Dg Chest 2 View 08/03/2013 No acute cardiopulmonary process. Slight improved aeration the lung  bases compared to prior.  Dg Shoulder Right 08/03/2013 No acute findings. Severe glenohumeral osteoarthritis. Osteopenia.  Antibiotics:  None  Code Status: Full  Family Communication: Pt at bedside  Disposition Plan: transfer to telemetry    HPI/Subjective: No events overnight.   Objective: Filed Vitals:   08/07/13 0540 08/07/13 0600 08/07/13 0700 08/07/13 0800  BP: 167/63 181/56 169/58 181/55  Pulse:  73 70 72  Temp:      TempSrc:      Resp:  18 17 20   Height:      Weight:      SpO2:  94% 94% 92%    Intake/Output Summary (Last 24 hours) at 08/07/13 0836 Last data filed at 08/07/13 0800  Gross per 24 hour  Intake    683 ml  Output    800 ml  Net   -117 ml    Exam:   General:  Pt is alert, follows commands appropriately, not in acute distress  Cardiovascular: Regular rate and rhythm, SEM 3/6, no rubs, no gallops  Respiratory: Clear to auscultation bilaterally, no wheezing, no crackles, no rhonchi  Abdomen: Soft, non tender, non distended, bowel sounds present, no guarding  Data Reviewed: Basic Metabolic Panel:  Recent Labs Lab 08/03/13 1544 08/04/13 0406 08/05/13 0349 08/06/13 0337 08/07/13 0323  NA 135* 137 141 135* 132*  K 4.4 3.6* 3.9 5.5* 4.9  CL 103 107 110 107 105  CO2 21 19 20  17* 17*  GLUCOSE 462* 311* 68* 117* 151*  BUN 19 19 23 21  21  CREATININE 1.47* 1.04 1.25* 1.14* 1.02  CALCIUM 8.3* 8.1* 9.0 8.9 8.8   Liver Function Tests:  Recent Labs Lab 08/03/13 1544 08/04/13 0406 08/05/13 0349  AST 79* 55* 42*  ALT 92* 78* 72*  ALKPHOS 85 78 69  BILITOT <0.2* <0.2* <0.2*  PROT 5.9* 5.6* 5.8*  ALBUMIN 2.7* 2.6* 2.8*   No results found for this basename: LIPASE, AMYLASE,  in the last 168 hours No results found for this basename: AMMONIA,  in the last 168 hours CBC:  Recent Labs Lab 08/03/13 1544 08/04/13 0406 08/05/13 0349 08/06/13 0337 08/07/13 0323  WBC 2.9* 4.5 4.4 4.5 3.7*  HGB 12.7 12.0 13.3 13.5 12.0  HCT 37.5 35.9* 39.3  39.0 35.4*  MCV 97.9 97.0 96.8 95.4 96.7  PLT 153 148* 163 183 162   Cardiac Enzymes:  Recent Labs Lab 08/04/13 1413  CKTOTAL 50  CKMB 2.7   BNP: No components found with this basename: POCBNP,  CBG:  Recent Labs Lab 08/05/13 2218 08/06/13 0746 08/06/13 1208 08/06/13 1633 08/06/13 2122  GLUCAP 236* 173* 188* 279* 207*    Recent Results (from the past 240 hour(s))  MRSA PCR SCREENING     Status: None   Collection Time    08/03/13 10:05 PM      Result Value Ref Range Status   MRSA by PCR NEGATIVE  NEGATIVE Final   Comment:            The GeneXpert MRSA Assay (FDA     approved for NASAL specimens     only), is one component of a     comprehensive MRSA colonization     surveillance program. It is not     intended to diagnose MRSA     infection nor to guide or     monitor treatment for     MRSA infections.     Scheduled Meds: . aspirin EC  81 mg Oral Daily  . atorvastatin  20 mg Oral q1800  . cloNIDine  0.2 mg Oral BID  . clopidogrel  75 mg Oral Daily  . heparin  5,000 Units Subcutaneous 3 times per day  . hydrALAZINE  50 mg Oral 4 times per day  . insulin aspart  0-9 Units Subcutaneous TID WC  . insulin aspart protamine- aspart  8 Units Subcutaneous BID WC  . isosorbide mononitrate  120 mg Oral Daily  . omega-3 acid ethyl esters  1 g Oral BID  . sodium chloride  3 mL Intravenous Q12H   Continuous Infusions: . niCARDipine Stopped (08/06/13 1555)     Theodis Blaze, MD  [facility] Pager [phone]  If 7PM-7AM, please contact night-coverage [email or web address] Password [facility] 08/07/2013, 8:36 AM   LOS: 4 days

## 2013-08-08 DIAGNOSIS — R7402 Elevation of levels of lactic acid dehydrogenase (LDH): Secondary | ICD-10-CM

## 2013-08-08 DIAGNOSIS — E785 Hyperlipidemia, unspecified: Secondary | ICD-10-CM

## 2013-08-08 DIAGNOSIS — I6350 Cerebral infarction due to unspecified occlusion or stenosis of unspecified cerebral artery: Secondary | ICD-10-CM

## 2013-08-08 DIAGNOSIS — E43 Unspecified severe protein-calorie malnutrition: Secondary | ICD-10-CM

## 2013-08-08 DIAGNOSIS — R740 Nonspecific elevation of levels of transaminase and lactic acid dehydrogenase [LDH]: Secondary | ICD-10-CM

## 2013-08-08 DIAGNOSIS — Z8673 Personal history of transient ischemic attack (TIA), and cerebral infarction without residual deficits: Secondary | ICD-10-CM

## 2013-08-08 LAB — BASIC METABOLIC PANEL
BUN: 20 mg/dL (ref 6–23)
BUN: 23 mg/dL (ref 6–23)
CHLORIDE: 100 meq/L (ref 96–112)
CO2: 17 meq/L — AB (ref 19–32)
CO2: 19 mEq/L (ref 19–32)
CREATININE: 0.97 mg/dL (ref 0.50–1.10)
CREATININE: 1.12 mg/dL — AB (ref 0.50–1.10)
Calcium: 8.9 mg/dL (ref 8.4–10.5)
Calcium: 8.4 mg/dL (ref 8.4–10.5)
Chloride: 102 mEq/L (ref 96–112)
GFR calc Af Amer: 53 mL/min — ABNORMAL LOW (ref >90)
GFR calc non Af Amer: 55 mL/min — ABNORMAL LOW (ref >90)
GFR calc non Af Amer: 46 mL/min — ABNORMAL LOW (ref >90)
GFR, EST AFRICAN AMERICAN: 64 mL/min — AB (ref >90)
Glucose, Bld: 38 mg/dL — CL (ref 70–99)
Glucose, Bld: 182 mg/dL — ABNORMAL HIGH (ref 70–99)
POTASSIUM: 5.9 meq/L — AB (ref 3.7–5.3)
Potassium: 5.1 mEq/L (ref 3.7–5.3)
SODIUM: 131 meq/L — AB (ref 137–147)
Sodium: 129 mEq/L — ABNORMAL LOW (ref 137–147)

## 2013-08-08 LAB — CBC
HCT: 39.8 % (ref 36.0–46.0)
HEMOGLOBIN: 13.7 g/dL (ref 12.0–15.0)
MCH: 33.5 pg (ref 26.0–34.0)
MCHC: 34.4 g/dL (ref 30.0–36.0)
MCV: 97.3 fL (ref 78.0–100.0)
PLATELETS: 174 10*3/uL (ref 150–400)
RBC: 4.09 MIL/uL (ref 3.87–5.11)
RDW: 13.7 % (ref 11.5–15.5)
WBC: 3.1 10*3/uL — AB (ref 4.0–10.5)

## 2013-08-08 LAB — GLUCOSE, CAPILLARY
GLUCOSE-CAPILLARY: 105 mg/dL — AB (ref 70–99)
Glucose-Capillary: 65 mg/dL — ABNORMAL LOW (ref 70–99)
Glucose-Capillary: 91 mg/dL (ref 70–99)
Glucose-Capillary: 213 mg/dL — ABNORMAL HIGH (ref 70–99)
Glucose-Capillary: 178 mg/dL — ABNORMAL HIGH (ref 70–99)

## 2013-08-08 MED ORDER — ISOSORBIDE MONONITRATE ER 60 MG PO TB24
150.0000 mg | ORAL_TABLET | Freq: Every day | ORAL | Status: DC
  Administered 2013-08-08 – 2013-08-09 (×2): 150 mg via ORAL
  Filled 2013-08-08 (×2): qty 1

## 2013-08-08 MED ORDER — GLUCAGON HCL RDNA (DIAGNOSTIC) 1 MG IJ SOLR
INTRAMUSCULAR | Status: AC
  Filled 2013-08-08: qty 1

## 2013-08-08 MED ORDER — LISINOPRIL 20 MG PO TABS: 20.0000 mg | ORAL_TABLET | Freq: Every day | ORAL | Status: DC

## 2013-08-08 MED ORDER — DEXTROSE 50 % IV SOLN
25.0000 mL | Freq: Once | INTRAVENOUS | Status: AC | PRN
  Administered 2013-08-08: 25 mL via INTRAVENOUS

## 2013-08-08 MED ORDER — SODIUM POLYSTYRENE SULFONATE 15 GM/60ML PO SUSP
30.0000 g | Freq: Three times a day (TID) | ORAL | Status: DC
  Administered 2013-08-08: 30 g via ORAL
  Filled 2013-08-08 (×4): qty 120

## 2013-08-08 NOTE — Progress Notes (Signed)
[facility] TEAM 1 - Stepdown/ICU TEAM Progress Note  Carmen [name] ATF:[id number] DOB: Jul 19, 1933 DOA: 08/03/2013 PCP: Maximino Greenland, MD  Admit HPI / Brief Narrative: 80 y.o. BF PMHx uncontrolled HTN, DM, HLD, CKD stage II - III, dementia, presented to the ED after a fall that occurred at home while she was carrying food, turned quickly and fell down on her bottom. Thankfully she was uninjured in fall; however, patient became hypertensive with SBPs persistently in the 220s-240s in the ED.    HPI/Subjective: 6/3 negative CP, negative SOB, negative DOB, negative headache.  Assessment/Plan: Hypertensive urgency  - continue hydralazine, -6/30 increased Imdur 150 mg daily -Continue Clonidine 0.2 mg BID PO  - Continue metoprolol 25 mg BID -Continue Hydralazine 100 mg QID  DM (diabetes mellitus), type 2, uncontrolled, periph vascular complications  - reasonable inpatient control  - continue Insulin  - add SSI   Retrovascular hypertension status post left renal artery stent 2009 and balloon angioplasty for in-stent restenosis 2011  - continue aspirin, plavix, statin   History of CVA (cerebrovascular accident) with associated mild right upper extremity hemiplegia  - PT/OT while inpatient  - home health PT   Transaminitis  - possibly from rhabdo  - Check in a.m.   Renal failure (ARF), acute on chronic  - Resolved  Severe malnutrition  - SLP, nutrition consultation appreciated  - liberalize the diet as pt is cachectic   Hyperkalemia  - Kayexalate 30 g  TID -Check K. Q6 hr   Code Status: FULL Family Communication: no family present at time of exam Disposition Plan: Control of BP    Consultants: NA  Procedure/Significant Events: 5/29 DG chest/right shoulder -No acute cardiopulmonary process.  - Slight improved aeration the lung bases compared to prior.severe degenerative change right shoulder   Culture NA  Antibiotics: NA  DVT  prophylaxis: Heparin   Devices NA   LINES / TUBES:  6/2 20ga right hand    Continuous Infusions:   Objective: VITAL SIGNS: Temp: 98.1 F (36.7 C) (06/03 1421) Temp src: Oral (06/03 1421) BP: 124/52 mmHg (06/03 1421) Pulse Rate: 59 (06/03 1421) SPO2; 97% on room air FIO2:   Intake/Output Summary (Last 24 hours) at 08/08/13 1650 Last data filed at 08/08/13 1400  Gross per 24 hour  Intake    360 ml  Output   1700 ml  Net  -1340 ml     Exam: General: A./O. x4, NAD, No acute respiratory distress Lungs: Clear to auscultation bilaterally without wheezes or crackles Cardiovascular: Regular rate and rhythm without murmur gallop or rub normal S1 and S2 Abdomen: Nontender, nondistended, soft, bowel sounds positive, no rebound, no ascites, no appreciable mass Extremities: No significant cyanosis, clubbing, or edema bilateral lower extremities  Data Reviewed: Basic Metabolic Panel:  Recent Labs Lab 08/04/13 0406 08/05/13 0349 08/06/13 0337 08/07/13 0323 08/08/13 0350  NA 137 141 135* 132* 131*  K 3.6* 3.9 5.5* 4.9 5.9*  CL 107 110 107 105 102  CO2 19 20 17* 17* 17*  GLUCOSE 311* 68* 117* 151* 38*  BUN 19 23 21 21 20   CREATININE 1.04 1.25* 1.14* 1.02 0.97  CALCIUM 8.1* 9.0 8.9 8.8 8.9   Liver Function Tests:  Recent Labs Lab 08/03/13 1544 08/04/13 0406 08/05/13 0349  AST 79* 55* 42*  ALT 92* 78* 72*  ALKPHOS 85 78 69  BILITOT <0.2* <0.2* <0.2*  PROT 5.9* 5.6* 5.8*  ALBUMIN 2.7* 2.6* 2.8*   No results found for this basename: LIPASE,  AMYLASE,  in the last 168 hours No results found for this basename: AMMONIA,  in the last 168 hours CBC:  Recent Labs Lab 08/04/13 0406 08/05/13 0349 08/06/13 0337 08/07/13 0323 08/08/13 0530  WBC 4.5 4.4 4.5 3.7* 3.1*  HGB 12.0 13.3 13.5 12.0 13.7  HCT 35.9* 39.3 39.0 35.4* 39.8  MCV 97.0 96.8 95.4 96.7 97.3  PLT 148* 163 183 162 174   Cardiac Enzymes:  Recent Labs Lab 08/04/13 1413  CKTOTAL 50  CKMB 2.7    BNP (last 3 results)  Recent Labs  08/12/12 1112 11/30/12 1509 12/11/12 1400  PROBNP 2540.0* 10923.0* 3706.0*   CBG:  Recent Labs Lab 08/07/13 2107 08/08/13 0454 08/08/13 0749 08/08/13 0804 08/08/13 1209  GLUCAP 100* 105* 65* 91 213*    Recent Results (from the past 240 hour(s))  MRSA PCR SCREENING     Status: None   Collection Time    08/03/13 10:05 PM      Result Value Ref Range Status   MRSA by PCR NEGATIVE  NEGATIVE Final   Comment:            The GeneXpert MRSA Assay (FDA     approved for NASAL specimens     only), is one component of a     comprehensive MRSA colonization     surveillance program. It is not     intended to diagnose MRSA     infection nor to guide or     monitor treatment for     MRSA infections.  URINE CULTURE     Status: None   Collection Time    08/07/13  3:30 AM      Result Value Ref Range Status   Specimen Description URINE, RANDOM   Final   Special Requests NONE   Final   Culture  Setup Time     Final   Value: 08/07/2013 08:43     Performed at [facility]     Final   Value: 20,OOO COLONIES/ML     Performed at [facility]   Culture     Final   Value: ESCHERICHIA COLI     Performed at [facility]   Report Status PENDING   Incomplete     Studies:  Recent x-ray studies have been reviewed in detail by the Attending Physician  Scheduled Meds:  Scheduled Meds: . aspirin EC  81 mg Oral Daily  . atorvastatin  20 mg Oral q1800  . cloNIDine  0.2 mg Oral TID  . clopidogrel  75 mg Oral Daily  . heparin  5,000 Units Subcutaneous 3 times per day  . hydrALAZINE  100 mg Oral 4 times per day  . insulin aspart  0-9 Units Subcutaneous TID WC  . insulin aspart protamine- aspart  8 Units Subcutaneous BID WC  . isosorbide mononitrate  150 mg Oral Daily  . metoprolol tartrate  25 mg Oral BID  . omega-3 acid ethyl esters  1 g Oral BID  . sodium chloride  3 mL Intravenous Q12H    Time spent on  care of this patient: 40 mins   Allie Bossier , MD   [facility] Office  [phone] Pager - [phone]  On-Call/Text Page:      [email or web address]      password [email or web address]  If 7PM-7AM, please contact night-coverage [email or web address] Password [email or web address] 08/08/2013, 4:50 PM   LOS: 5 days

## 2013-08-08 NOTE — Progress Notes (Signed)
Hypoglycemic Event  CBG: 38  Treatment: 28ml of D50 IV  Symptoms: None  Follow-up CBG: Time:0451 CBG Result:105  Possible Reasons for Event: Unknown  Comments/MD notified: K. Schorr, NP   No new orders received, will continue to monitor     Carmen [name] E Carmen [name]  Remember to initiate Hypoglycemia Order Set & completeAdult Hypoglycemia Protocol Treatment Guidelines  1. RN shall initiate Hypoglycemia Protocol emergency measures immediately when:            w        Routine or STAT CBG and/or a lab glucose indicates hypoglycemia (CBG < 70 mg/dl)  2. Treat the patient according to ability to take PO's and severity of hypoglycemia.   3. If patient is on GlucoStabilizer, follow directions provided by the [facility] for hypoglycemic events.  4. If patient on insulin pump, follow Hypoglycemia Protocol.  If patient requires more than one treatment have patient place pump in SUSPEND and notify MD.  DO NOT leave pump in SUSPEND for greater than 30 minutes unless ordered by MD.  A. Treatment for Mild or Moderate-Patient cooperative and able to swallow    1.  Patient taking PO's and can cooperate   a.  Give one of the following 15 gram CHO options:                           w     1 tube oral dextrose gel                           w     3-4 Glucose tablets                           w     4 oz. Juice                           w     4 oz. regular soda                                    ESRD patients:  clear, regular soda                           w     8 oz. skim milk    b.  Recheck CBG in 15 minutes after treatment                            w       If CBG < 70 mg/dl, repeat treatment and recheck until hypoglycemia is resolved                            w       If CBG > 70 mg/dl and next meal is more than 1 hour away, give additional 15 grams CHO   2.  Patient NPO-Patient cooperative and no altered mental status    a.  Give 25 ml of D50 IV.   b.  Recheck CBG in 15 minutes after  treatment.  w         If CBG is less than 70 mg/dl, repeat treatment and recheck until hypoglycemia is resolved.   c.  Notify MD for further orders.             SPECIAL CONSIDERATIONS:    a.  If no IV access,                              w        Start IV of D5W at [facility]                             w        Give 25 ml of D50 IV.    b.  If unable to gain IV access                             w          Give Glucagon IM:     i.  1 mg if patient weighs more than 45.5 kg     ii.  0.5 mg if patient weighs less than 45.5 kg   c.  Notify MD for further orders  B. Treatment for Severe-- Patient unconscious or unable to take PO's safely    1.  Position patient on side   2.  Give 50 ml D50 IV   3.  Recheck CBG in 15 minutes.                    w      If CBG is less than 70 mg/dl, repeat treatment and recheck until hypoglycemia is resolved.   4.  Notify MD for further orders.    SPECIAL CONSIDERATIONS:    a.  If no IV access                              w     Give Glucagon IM                                              i.  1 mg if patient weighs more than 45.5 kg                                             ii.  0.5 mg if patient weighs less than 45.5 kg                              w      Start IV of D5W at 50 ml/hr and give 50 ml D50 IV   b.  If no IV access and active seizure                               w       Call Rapid Response   c.  If unable to gain IV access, give Glucagon IM:  w          1 mg if patient weighs more than 45.5 kg                              w          0.5 mg if patient weighs less than 45.5 kg   d.  Notify MD for further orders.  C. Complete smart text progress note to document intervention and follow-up CBG   1. In [facility] patient chart, click on Notes (left side of screen)   2. Create Progress Note   3. Click on [employer].  In the Match box type "hypo" and enter    4. Double click on [facility] IP  HYPOGLYCEMIC EVENT and enter data   5. MD must be notified if patient is NPO or experienced severe hypoglycemia

## 2013-08-08 NOTE — Progress Notes (Signed)
Occupational Therapy Treatment Patient Details Name: Carmen [name] MRN: [id number] DOB: [birth year] Today's Date: 08/08/2013    History of present illness Carmen [name] is a 80 y.o. female who presents to the ED after a fall that occurred while at home.  She was carrying food, turned quickly and fell down on her bottom.  Thankfully she was uninjured in fall; however, patient hypertensive with SBPs persistently in the 220s-240s in the ED.  She denies missing any meds but isnt the greatest historian due to obvious forgetfulness on exam.  Per family her mental status has been and is currently at her baseline   OT comments  Son will help as needed. Pt needs increased time for transfers.  Follow Up Recommendations  Home health OT;Supervision/Assistance - 24 hour    Equipment Recommendations  3 in 1 bedside comode           Mobility Bed Mobility Overal bed mobility: Needs Assistance Bed Mobility: Supine to Sit           General bed mobility comments: Assist for trunk to upright and to scoot to EOB. Pt has no functional use of R UE.   Transfers Overall transfer level: Needs assistance     Sit to Stand: Min assist;From elevated surface Stand pivot transfers: Min assist       General transfer comment: Increased time to rise to standing. Assist to rise, stabilize, control descent.         ADL       Grooming: Minimal assistance;Standing                   Toilet Transfer: Minimal assistance;Stand-pivot;BSC   Toileting- Clothing Manipulation and Hygiene: Moderate assistance;Sit to/from stand         General ADL Comments: R arm painful this visit. RN gave pain meds.                 Cognition   Behavior During Therapy: WFL for tasks assessed/performed Overall Cognitive Status: Within Functional Limits for tasks assessed                                  Frequency Min 2X/week     Progress Toward Goals  OT Goals(current goals can now be  found in the care plan section)  Progress towards OT goals: Progressing toward goals      End of Session Equipment Utilized During Treatment: Gait belt   Activity Tolerance Patient tolerated treatment well   Patient Left in chair;with call bell/phone within reach;with family/visitor present   Nurse Communication Mobility status (pain in right knee, had a BM)        Time: 7829-5621 OT Time Calculation (min): 34 min  Charges: OT General Charges $OT Visit: 1 Procedure OT Treatments $Self Care/Home Management : 23-37 mins  [name] 08/08/2013, 1:10 PM

## 2013-08-08 NOTE — Progress Notes (Signed)
Physical Therapy Treatment Patient Details Name: Carmen [name] MRN: [id number] DOB: March 30, 1933 Today's Date: 08/08/2013    History of Present Illness [name] is a 80 y.o. female who presents to the ED after a fall that occurred while at home.  She was carrying food, turned quickly and fell down on her bottom.  Thankfully she was uninjured in fall; however, patient hypertensive with SBPs persistently in the 220s-240s in the ED.  She denies missing any meds but isnt the greatest historian due to obvious forgetfulness on exam.  Per family her mental status has been and is currently at her baseline    PT Comments    Assisted OOb to amb in hallway then assisted to BR.  Assisted back to bed. Pt plans to D/C back home with spouse and son.   Follow Up Recommendations  Home health PT     Equipment Recommendations       Recommendations for Other Services       Precautions / Restrictions Precautions Precautions: Fall Restrictions Weight Bearing Restrictions: No    Mobility  Bed Mobility Overal bed mobility: Needs Assistance Bed Mobility: Supine to Sit     Supine to sit: Min assist     General bed mobility comments: Assist for trunk to upright and to scoot to EOB. Pt has no functional use of R UE.   Transfers Overall transfer level: Needs assistance Equipment used: Rolling walker (2 wheeled) Transfers: Sit to/from Stand Sit to Stand: Min assist;From elevated surface Stand pivot transfers: Min assist       General transfer comment: Increased time to rise to standing. Assist to rise, stabilize, control descent.   Ambulation/Gait Ambulation/Gait assistance: Min assist Ambulation Distance (Feet): 250 Feet Assistive device: Rolling walker (2 wheeled) Gait Pattern/deviations: Step-through pattern;Trunk flexed Gait velocity: decreased   General Gait Details: 25% VC's on safety with turns using RW.  decreased use of R UE with limited shoulder ROM.   Stairs             Wheelchair Mobility    Modified Rankin (Stroke Patients Only)       Balance                                    Cognition Arousal/Alertness: Awake/alert Behavior During Therapy: WFL for tasks assessed/performed Overall Cognitive Status: Within Functional Limits for tasks assessed                      Exercises      General Comments        Pertinent Vitals/Pain C/o B knee pain "arthritis" pt stated    Home Living                      Prior Function            PT Goals (current goals can now be found in the care plan section) Progress towards PT goals: Progressing toward goals    Frequency  Min 3X/week    PT Plan      Co-evaluation             End of Session Equipment Utilized During Treatment: Gait belt Activity Tolerance: Patient tolerated treatment well Patient left: in bed;with call bell/phone within reach;with bed alarm set     Time: 4825-0037 PT Time Calculation (min): 24 min  Charges:  $Gait Training: 8-22 mins $Therapeutic  Activity: 8-22 mins                    G Codes:      Rica Koyanagi  PTA [facility]  Acute  Rehab Pager      [phone]

## 2013-08-08 NOTE — Progress Notes (Signed)
Inpatient Diabetes Program Recommendations  AACE/ADA: New Consensus Statement on Inpatient Glycemic Control (2013)  Target Ranges:  Prepandial:   less than 140 mg/dL      Peak postprandial:   less than 180 mg/dL (1-2 hours)      Critically ill patients:  140 - 180 mg/dL   Reason for Visit: Hypoglycemia  Results for Carmen [name], Carmen [name] (MRN [id number]) as of 08/08/2013 11:19  Ref. Range 08/07/2013 03:23 08/08/2013 03:50  Glucose Latest Range: 70-99 mg/dL 151 (H) 38 (LL)  Results for Carmen [name], Carmen [name] (MRN [id number]) as of 08/08/2013 11:19  Ref. Range 08/08/2013 04:54 08/08/2013 07:49 08/08/2013 08:04  Glucose-Capillary Latest Range: 70-99 mg/dL 105 (H) 65 (L) 91   Hypoglycemia this am.  May benefit from decreasing 70/30 to 6 units bid. Recommend HS snack if pt is willing.  Thank you. Lorenda Peck, RD, LDN, CDE Inpatient Diabetes Coordinator [phone]

## 2013-08-09 DIAGNOSIS — N183 Chronic kidney disease, stage 3 unspecified: Secondary | ICD-10-CM

## 2013-08-09 LAB — URINE CULTURE

## 2013-08-09 LAB — COMPREHENSIVE METABOLIC PANEL
ALK PHOS: 64 U/L (ref 39–117)
ALT: 41 U/L — AB (ref 0–35)
AST: 29 U/L (ref 0–37)
Albumin: 2.6 g/dL — ABNORMAL LOW (ref 3.5–5.2)
BUN: 23 mg/dL (ref 6–23)
CO2: 20 meq/L (ref 19–32)
Calcium: 8.6 mg/dL (ref 8.4–10.5)
Chloride: 102 mEq/L (ref 96–112)
Creatinine, Ser: 1.18 mg/dL — ABNORMAL HIGH (ref 0.50–1.10)
GFR calc Af Amer: 50 mL/min — ABNORMAL LOW (ref >90)
GFR, EST NON AFRICAN AMERICAN: 43 mL/min — AB (ref >90)
Glucose, Bld: 67 mg/dL — ABNORMAL LOW (ref 70–99)
POTASSIUM: 4.7 meq/L (ref 3.7–5.3)
SODIUM: 132 meq/L — AB (ref 137–147)
Total Protein: 5.6 g/dL — ABNORMAL LOW (ref 6.0–8.3)

## 2013-08-09 LAB — GLUCOSE, CAPILLARY
Glucose-Capillary: 146 mg/dL — ABNORMAL HIGH (ref 70–99)
Glucose-Capillary: 93 mg/dL (ref 70–99)
Glucose-Capillary: 132 mg/dL — ABNORMAL HIGH (ref 70–99)

## 2013-08-09 LAB — POTASSIUM: Potassium: 4.2 mEq/L (ref 3.7–5.3)

## 2013-08-09 MED ORDER — METOPROLOL TARTRATE 25 MG PO TABS: 25.0000 mg | ORAL_TABLET | Freq: Two times a day (BID) | ORAL | Status: DC

## 2013-08-09 MED ORDER — POTASSIUM CHLORIDE CRYS ER 10 MEQ PO TBCR
10.0000 meq | EXTENDED_RELEASE_TABLET | Freq: Every day | ORAL | Status: DC
Start: 2013-08-10 — End: 2015-01-16

## 2013-08-09 MED ORDER — CIPROFLOXACIN HCL 250 MG PO TABS
250.0000 mg | ORAL_TABLET | Freq: Two times a day (BID) | ORAL | Status: DC
  Filled 2013-08-09 (×3): qty 1

## 2013-08-09 MED ORDER — HYDRALAZINE HCL 100 MG PO TABS: 100.0000 mg | ORAL_TABLET | Freq: Four times a day (QID) | ORAL | Status: DC

## 2013-08-09 MED ORDER — CIPROFLOXACIN HCL 250 MG PO TABS: 250.0000 mg | ORAL_TABLET | Freq: Two times a day (BID) | ORAL | Status: DC

## 2013-08-09 MED ORDER — CLONIDINE HCL 0.2 MG PO TABS: 0.2000 mg | ORAL_TABLET | Freq: Three times a day (TID) | ORAL | Status: DC

## 2013-08-09 MED ORDER — ACETAMINOPHEN-CODEINE #3 300-30 MG PO TABS: 1.0000 | ORAL_TABLET | Freq: Four times a day (QID) | ORAL | Status: DC | PRN

## 2013-08-09 MED ORDER — TRAMADOL HCL 50 MG PO TABS: 50.0000 mg | ORAL_TABLET | Freq: Two times a day (BID) | ORAL | Status: DC | PRN

## 2013-08-09 MED ORDER — FUROSEMIDE 20 MG PO TABS: 20.0000 mg | ORAL_TABLET | Freq: Every day | ORAL | Status: DC

## 2013-08-09 NOTE — Progress Notes (Signed)
Came to visit patient at bedside. She is active with [facility]. She reports she is going home today. Made her aware that [facility] will follow up with her post hospital discharge. Appreciative of visit. Will make inpatient RNCM aware. Marthenia Rolling, MSN- [facility] Liaison463 [phone]

## 2013-08-09 NOTE — Progress Notes (Signed)
Occupational Therapy Treatment Patient Details Name: JOSCELINE CHENARD MRN: [id number] DOB: Jun 11, 1933 Today's Date: 08/09/2013       OT comments  Children and husband will A pt at home  Follow Up Recommendations  Home health OT;Supervision/Assistance - 24 hour          Precautions / Restrictions Precautions Precautions: Fall Restrictions Weight Bearing Restrictions: No       Mobility Bed Mobility Overal bed mobility: Needs Assistance Bed Mobility: Supine to Sit     Supine to sit: Min assist     General bed mobility comments: Assist for trunk to upright and to scoot to EOB. Pt has no functional use of R UE.   Transfers Overall transfer level: Needs assistance Equipment used: Rolling walker (2 wheeled)   Sit to Stand: Min assist;From elevated surface         General transfer comment: Increased time to rise to standing. Assist to rise, stabilize, control descent.         ADL       Grooming: Minimal assistance;Standing                   Toilet Transfer: Minimal assistance;Stand-pivot;BSC   Toileting- Clothing Manipulation and Hygiene: Minimal assistance;Sit to/from stand         General ADL Comments: verbal cues for safety      Vision                                           Progress Toward Goals  OT Goals(current goals can now be found in the care plan section)        Plan Discharge plan remains appropriate    Co-evaluation                 End of Session     Activity Tolerance Patient tolerated treatment well   Patient Left in chair;with call bell/phone within reach;with family/visitor present           Time: 1224-8250 OT Time Calculation (min): 17 min  Charges: OT General Charges $OT Visit: 1 Procedure OT Treatments $Self Care/Home Management : 8-22 mins  [name] 08/09/2013, 11:12 AM

## 2013-08-09 NOTE — Discharge Summary (Signed)
Physician Discharge Summary  Patient ID: Carmen [name] MRN: [id number] DOB/AGE: 78-Oct-1935 78 y.o.  Admit date: 08/03/2013 Discharge date: 08/09/2013  Primary Care Physician:  Maximino Greenland, MD  Discharge Diagnoses:    Hypertensive urgency/accelerated hypertension  . DM (diabetes mellitus), type 2, uncontrolled, periph vascular complic . Renal failure (ARF), acute on chronic . Retrovascular hypertension status post left renal artery stent 2009 and balloon angioplasty for in-stent restenosis 2011 . Escherichia coli UTI  . Transaminitis  Consults:  None   Recommendations for Outpatient Follow-up:  Patient is to be followed closely for antihypertensive medication dose adjustment.  Statin has been placed on hold due to transaminitis from rhabdomyolysis, please check LFTs at follow-up  Please check BMET at the time of follow-up  Allergies:   Allergies  Allergen Reactions  . Peanuts [Peanut Oil] Swelling  . Norvasc [Amlodipine Besylate] Swelling  . Peanut-Containing Drug Products Other (See Comments)    Cause my stomach to hurt     Discharge Medications:   Medication List    STOP taking these medications       ibuprofen 200 MG tablet  Commonly known as:  ADVIL,MOTRIN     rosuvastatin 10 MG tablet  Commonly known as:  CRESTOR     spironolactone 50 MG tablet  Commonly known as:  ALDACTONE      TAKE these medications       acetaminophen-codeine 300-30 MG per tablet  Commonly known as:  TYLENOL #3  Take 1 tablet by mouth every 6 (six) hours as needed for moderate pain.     ALPRAZolam 0.5 MG tablet  Commonly known as:  XANAX  Take 0.5 mg by mouth 3 (three) times daily as needed for anxiety.     aspirin EC 81 MG tablet  Take 81 mg by mouth daily.     ciprofloxacin 250 MG tablet  Commonly known as:  CIPRO  Take 1 tablet (250 mg total) by mouth 2 (two) times daily. X 3days     cloNIDine 0.2 MG tablet  Commonly known as:  CATAPRES  Take 1 tablet (0.2 mg  total) by mouth 3 (three) times daily.     clopidogrel 75 MG tablet  Commonly known as:  PLAVIX  Take 75 mg by mouth daily.     ergocalciferol 50000 UNITS capsule  Commonly known as:  VITAMIN D2  Take 50,000 Units by mouth 2 (two) times a week. Tuesday and friday     furosemide 20 MG tablet  Commonly known as:  LASIX  Take 1 tablet (20 mg total) by mouth daily.  Start taking on:  08/10/2013     hydrALAZINE 100 MG tablet  Commonly known as:  APRESOLINE  Take 1 tablet (100 mg total) by mouth 4 (four) times daily.     insulin NPH-regular Human (70-30) 100 UNIT/ML injection  Commonly known as:  NOVOLIN 70/30  Inject 8 Units into the skin 2 (two) times daily with a meal.     isosorbide mononitrate 120 MG 24 hr tablet  Commonly known as:  IMDUR  Take 1 tablet (120 mg total) by mouth daily.     LOVAZA 1 G capsule  Generic drug:  omega-3 acid ethyl esters  Take 1 g by mouth 2 (two) times daily.     metoprolol tartrate 25 MG tablet  Commonly known as:  LOPRESSOR  Take 1 tablet (25 mg total) by mouth 2 (two) times daily.     potassium chloride 10 MEQ tablet  Commonly known  as:  K-DUR,KLOR-CON  Take 1 tablet (10 mEq total) by mouth daily.  Start taking on:  08/10/2013         Brief H and P: For complete details please refer to admission H and P, but in briefMaxine B [name] is a 80 y.o. female who presented to the ED after a fall  that occurred while at home. She was carrying food, turned quickly and fell down on her bottom. Thankfully she was uninjured in fall; however, patient was hypertensive with SBPs persistently in the 220s-240s in the ED. She denied missing any meds but isnt the greatest historian due to obvious forgetfulness on exam. Per family her mental status has been and is currently at her baseline.  Other lab abnormalities include a BGL of over 400, creatinine 1.47 (was 1.0 on discharge last time) and her transaminases continue to be chronically elevated.   Hospital  Course:  80 y.o. Female with HTN, DM, HLD, CKD stage II - III, dementia, presented to the ED after a fall that occurred at home while she was carrying food, turned quickly and fell down on her bottom. Fortunately, she was uninjured in fall; however, patient became hypertensive with SBPs persistently in the 220s-240s in the ED and was admitted to step down unit.   Hypertensive urgency : Patient was admitted to the step down unit, she required labetalol drip on 5/30 with no significant improvement in her BP readings, requiring replacement subsequently to the Cardene drip. She has been off the Cardene drip, was placed on hydralazine, Imdur, metoprolol, clonidine. As patient also had acute renal insufficiency with a creatinine of 1.47, hyperkalemia hence Lasix and spironolactone were held. Ibuprofen was discontinued from her medication profile Patient is currently on hydralazine 100 mg qid, Imdur 120mg  daily, clonidine 0.2 mg 3 times a day, metoprolol 25 mg twice a day BB can be titrated up according to the heart rate and BP at the time of follow up with her PCP.  As her creatinine function has normalized, will restart Lasix from tomorrow at 20 mg daily  DM (diabetes mellitus), type 2, uncontrolled, periph vascular complications  - reasonable inpatient control,  continue Insulin   Retrovascular hypertension status post left renal artery stent 2009 and balloon angioplasty for in-stent restenosis 2011  - continue aspirin, plavix, statin   History of CVA (cerebrovascular accident) with associated mild right upper extremity hemiplegia  - PT/OT while inpatient, home health PT, OT, RN was setup by case management    Transaminitis  - possibly from rhabdomyolysis,improving, statin has been placed on hold  Renal failure (ARF), acute on chronic  - Resolved   Severe malnutrition  -Patient received nutritional consult, continue to encourage oral diet  Hyperkalemia : Potassium was 5.9 on 08/08/2013,  spironolactone was discontinued and patient was placed on Kayexalate. Potassium has improved to 4.2 today at discharge.  Potassium will be restarted at 10 mEq once patient's restart her Lasix., Please check a BMET at the time of her appointment   Mild Escherichia coli UTI: Urine culture showed only 20,000 colonies of Escherichia coli, will treat with ciprofloxacin for 3 days  Day of Discharge BP 127/87  Pulse 59  Temp(Src) 98.1 F (36.7 C) (Oral)  Resp 16  Ht 5\' 4"  (1.626 m)  Wt 50.3 kg (110 lb 14.3 oz)  BMI 19.03 kg/m2  SpO2 97%  Physical Exam: General: Alert and awake oriented  not in any acute distress. CVS: S1-S2 clear no murmur rubs or gallops Chest: clear  to auscultation bilaterally, no wheezing rales or rhonchi Abdomen: soft nontender, nondistended, normal bowel sounds Extremities: no cyanosis, clubbing or edema noted bilaterally Neuro: Cranial nerves II-XII intact, no focal neurological deficits   The results of significant diagnostics from this hospitalization (including imaging, microbiology, ancillary and laboratory) are listed below for reference.    LAB RESULTS: Basic Metabolic Panel:  Recent Labs Lab 08/08/13 1957 08/09/13 0200 08/09/13 0810  NA 129* 132*  --   K 5.1 4.7 4.2  CL 100 102  --   CO2 19 20  --   GLUCOSE 182* 67*  --   BUN 23 23  --   CREATININE 1.12* 1.18*  --   CALCIUM 8.4 8.6  --    Liver Function Tests:  Recent Labs Lab 08/05/13 0349 08/09/13 0200  AST 42* 29  ALT 72* 41*  ALKPHOS 69 64  BILITOT <0.2* <0.2*  PROT 5.8* 5.6*  ALBUMIN 2.8* 2.6*   No results found for this basename: LIPASE, AMYLASE,  in the last 168 hours No results found for this basename: AMMONIA,  in the last 168 hours CBC:  Recent Labs Lab 08/07/13 0323 08/08/13 0530  WBC 3.7* 3.1*  HGB 12.0 13.7  HCT 35.4* 39.8  MCV 96.7 97.3  PLT 162 174   Cardiac Enzymes:  Recent Labs Lab 08/04/13 1413  CKTOTAL 50  CKMB 2.7   BNP: No components found with  this basename: POCBNP,  CBG:  Recent Labs Lab 08/09/13 0740 08/09/13 1140  GLUCAP 93 132*    Significant Diagnostic Studies:  Dg Chest 2 View  08/03/2013   CLINICAL DATA:  Hypertension  EXAM: CHEST  2 VIEW  COMPARISON:  06/02/2013  FINDINGS: Left-sided pacemaker overlies normal cardiac silhouette. No effusion, infiltrate, or pneumothorax. Vascular stent noted the right lung apex. Improved aeration the lung bases. Severe degenerate changes of the right shoulder.  IMPRESSION: 1.  No acute cardiopulmonary process. 2. Slight improved aeration the lung bases compared to prior. Per 3 severe degenerative change in the right shoulder.   Electronically Signed   By: Suzy Bouchard M.D.   On: 08/03/2013 16:19   Dg Shoulder Right  08/03/2013   CLINICAL DATA:  Fall. Right shoulder injury and pain. Decreased range of motion.  EXAM: RIGHT SHOULDER - 2+ VIEW  COMPARISON:  04/11/2008  FINDINGS: No evidence of acute fracture or dislocation. Diffuse osteopenia noted. Severe glenohumeral osteoarthritis is demonstrated. Peripheral vascular calcification also noted.  IMPRESSION: No acute findings.  Severe glenohumeral osteoarthritis.  Osteopenia.   Electronically Signed   By: Earle Gell M.D.   On: 08/03/2013 16:16     Disposition and Follow-up: Discharge Instructions   Discharge instructions    Complete by:  As directed   Please check your blood pressure daily. If blood pressure is still elevated SBP >165, DBP >100 despite the medications, you can take one extra pill of Lopressor (metoprolol) that day.  Please note all the changes in the blood pressure medications before you start taking them at home.            DISPOSITION: Home DIET: Carb modified diet   DISCHARGE FOLLOW-UP Follow-up Information   Follow up with Maximino Greenland, MD. Schedule an appointment as soon as possible for a visit in 10 days. (for hospital follow-up. please discuss about your BP medications for further adjustments.)     Specialty:  Internal Medicine   Contact information:   [address] [place] [address] [phone]  Time spent on Discharge: 45 mins   Signed:   Kourtnie Sachs Krystal Eaton M.D. [facility] 08/09/2013, 12:31 PM Pager: [phone]   **Disclaimer: This note was dictated with voice recognition software. Similar sounding words can inadvertently be transcribed and this note may contain transcription errors which may not have been corrected upon publication of note.**

## 2013-08-09 NOTE — Care Management Note (Signed)
    Page 1 of 2   08/09/2013     12:15:45 PM CARE MANAGEMENT NOTE 08/09/2013  Patient:  SOLINA, HERON   Account Number:  [id number]  Date Initiated:  08/06/2013  Documentation initiated by:  DAVIS,RHONDA  Subjective/Objective Assessment:   hypertensive crisis and iv meds     Action/Plan:   home when stable   Anticipated DC Date:  08/09/2013   Anticipated DC Plan:  [place] referral  NA      DC Planning Services  CM consult      [facility] Choice  NA   Choice offered to / List presented to:  [name]   DME arranged  NA      DME agency  NA        Status of service:  In process, will continue to follow Medicare Important Message given?  YES (If response is "NO", the following Medicare IM given date fields will be blank) Date Medicare IM given:  08/09/2013 Date Additional Medicare IM given:    Discharge Disposition:    Per UR Regulation:  Reviewed for med. necessity/level of care/duration of stay  If discussed at [place] of Stay Meetings, dates discussed:   08/09/2013    Comments:  08/09/13 [facility] RN,BSN NCM [phone] [place] HHPT/OT.  [id number]/VOHYWV Rosana Hoes, RN, BSN, [place] [phone] Chart Reviewed for discharge and hospital needs. Discharge needs at time of review: None present will follow for needs. Review of patient progress due on [id number].

## 2013-08-14 ENCOUNTER — Emergency Department (HOSPITAL_COMMUNITY)
Admission: EM | Admit: 2013-08-14 | Discharge: 2013-08-14 | Disposition: A | Discharge status: Home / Self Care | Payer: Medicare Other | Attending: Emergency Medicine | Admitting: Emergency Medicine

## 2013-08-14 ENCOUNTER — Encounter (HOSPITAL_COMMUNITY): Payer: Self-pay | Admitting: Emergency Medicine

## 2013-08-14 DIAGNOSIS — Z8673 Personal history of transient ischemic attack (TIA), and cerebral infarction without residual deficits: Secondary | ICD-10-CM | POA: Insufficient documentation

## 2013-08-14 DIAGNOSIS — Z8669 Personal history of other diseases of the nervous system and sense organs: Secondary | ICD-10-CM | POA: Insufficient documentation

## 2013-08-14 DIAGNOSIS — M129 Arthropathy, unspecified: Secondary | ICD-10-CM | POA: Insufficient documentation

## 2013-08-14 DIAGNOSIS — Z87448 Personal history of other diseases of urinary system: Secondary | ICD-10-CM | POA: Insufficient documentation

## 2013-08-14 DIAGNOSIS — Z7902 Long term (current) use of antithrombotics/antiplatelets: Secondary | ICD-10-CM | POA: Insufficient documentation

## 2013-08-14 DIAGNOSIS — F411 Generalized anxiety disorder: Secondary | ICD-10-CM | POA: Insufficient documentation

## 2013-08-14 DIAGNOSIS — I739 Peripheral vascular disease, unspecified: Secondary | ICD-10-CM | POA: Insufficient documentation

## 2013-08-14 DIAGNOSIS — Z79899 Other long term (current) drug therapy: Secondary | ICD-10-CM | POA: Insufficient documentation

## 2013-08-14 DIAGNOSIS — Z9889 Other specified postprocedural states: Secondary | ICD-10-CM | POA: Insufficient documentation

## 2013-08-14 DIAGNOSIS — E119 Type 2 diabetes mellitus without complications: Secondary | ICD-10-CM | POA: Insufficient documentation

## 2013-08-14 DIAGNOSIS — Z794 Long term (current) use of insulin: Secondary | ICD-10-CM | POA: Insufficient documentation

## 2013-08-14 DIAGNOSIS — I10 Essential (primary) hypertension: Secondary | ICD-10-CM

## 2013-08-14 DIAGNOSIS — F17200 Nicotine dependence, unspecified, uncomplicated: Secondary | ICD-10-CM | POA: Insufficient documentation

## 2013-08-14 DIAGNOSIS — I2510 Atherosclerotic heart disease of native coronary artery without angina pectoris: Secondary | ICD-10-CM | POA: Insufficient documentation

## 2013-08-14 DIAGNOSIS — Z95810 Presence of automatic (implantable) cardiac defibrillator: Secondary | ICD-10-CM | POA: Insufficient documentation

## 2013-08-14 LAB — CBC WITH DIFFERENTIAL/PLATELET
Basophils Absolute: 0 10*3/uL (ref 0.0–0.1)
Basophils Relative: 0 % (ref 0–1)
Eosinophils Absolute: 0.2 10*3/uL (ref 0.0–0.7)
Eosinophils Relative: 5 % (ref 0–5)
HCT: 37.6 % (ref 36.0–46.0)
HEMOGLOBIN: 12.6 g/dL (ref 12.0–15.0)
LYMPHS PCT: 41 % (ref 12–46)
Lymphs Abs: 1.4 10*3/uL (ref 0.7–4.0)
MCH: 33.1 pg (ref 26.0–34.0)
MCHC: 33.5 g/dL (ref 30.0–36.0)
MCV: 98.7 fL (ref 78.0–100.0)
MONO ABS: 0.2 10*3/uL (ref 0.1–1.0)
MONOS PCT: 6 % (ref 3–12)
NEUTROS ABS: 1.6 10*3/uL — AB (ref 1.7–7.7)
NEUTROS PCT: 48 % (ref 43–77)
Platelets: 157 10*3/uL (ref 150–400)
RBC: 3.81 MIL/uL — ABNORMAL LOW (ref 3.87–5.11)
RDW: 13.6 % (ref 11.5–15.5)
WBC: 3.3 10*3/uL — AB (ref 4.0–10.5)

## 2013-08-14 LAB — COMPREHENSIVE METABOLIC PANEL
ALK PHOS: 71 U/L (ref 39–117)
ALT: 24 U/L (ref 0–35)
AST: 25 U/L (ref 0–37)
Albumin: 2.9 g/dL — ABNORMAL LOW (ref 3.5–5.2)
BUN: 19 mg/dL (ref 6–23)
CO2: 24 meq/L (ref 19–32)
CREATININE: 1.02 mg/dL (ref 0.50–1.10)
Calcium: 8.6 mg/dL (ref 8.4–10.5)
Chloride: 102 mEq/L (ref 96–112)
GFR calc Af Amer: 60 mL/min — ABNORMAL LOW (ref >90)
GFR, EST NON AFRICAN AMERICAN: 52 mL/min — AB (ref >90)
Glucose, Bld: 108 mg/dL — ABNORMAL HIGH (ref 70–99)
POTASSIUM: 3.3 meq/L — AB (ref 3.7–5.3)
Sodium: 141 mEq/L (ref 137–147)
Total Bilirubin: 0.2 mg/dL — ABNORMAL LOW (ref 0.3–1.2)
Total Protein: 6.2 g/dL (ref 6.0–8.3)

## 2013-08-14 LAB — I-STAT TROPONIN, ED: Troponin i, poc: 0.01 ng/mL (ref 0.00–0.08)

## 2013-08-14 MED ORDER — CLONIDINE HCL 0.2 MG PO TABS
0.2000 mg | ORAL_TABLET | Freq: Once | ORAL | Status: AC
  Administered 2013-08-14: 0.2 mg via ORAL
  Filled 2013-08-14: qty 1

## 2013-08-14 NOTE — ED Notes (Signed)
Pt provided [place] sandwich and diet coke.

## 2013-08-14 NOTE — ED Notes (Signed)
MD at bedside. 

## 2013-08-14 NOTE — ED Provider Notes (Signed)
CSN: [id number]     Arrival date & time 08/14/13  1147 History   First MD Initiated Contact with Patient 08/14/13 1209     Chief Complaint  Patient presents with  . Hypertension     (Consider location/radiation/quality/duration/timing/severity/associated sxs/prior Treatment) HPI Carmen [name] is a 80 y.o. female who presents to ED with complaint of elevated blood pressure. Pt states her home health nurse came today and took her BP and it was 462M systolic. States was recently admitted for Hypertensive urgency with BP in 220s after a fall with new renal insufficiency. Pt states they added clonidine to her meds, states taking all her medications, took all her morning doses. Pt denies any complains including chest pain, shortness of breath, swelling in extremities, headache, n/v.   Past Medical History  Diagnosis Date  . Diabetes mellitus   . Hypertension   . Coronary artery disease   . Renal disorder   . Herniated lumbar intervertebral disc   . Cataract     cataract surgery scheduled for 03/31/11, 2 - left eye, 1 - right eye  . Renovascular hypertension      s/p left renal artery stent 12/2007.  S/P balloon angioplasty on 02/16/10 for ISR, BP was  controlled well since then.     . Claudication in peripheral vascular disease     02/16/10: Left CIA 9.0x28 Omnilink and REIA 8.0x40 seff expanding Zilver.   right subclavian artery stent 03/18/2008,  . S/P carotid endarterectomy      left carotid endarterectomy  1991; Stroke and TIA in 1999 with right sided weakness, now with residual right arm weakness  . Stroke     TIA  . Peripheral vascular disease   . Anxiety   . Arthritis   . Pacemaker    Past Surgical History  Procedure Laterality Date  . Appendectomy    . Abdominal hysterectomy    . Ureteral stent placement    . Carotid endarterectomy    . Laminectomy    . Back surgery    . Colonoscopy  07/30/2011    Procedure: COLONOSCOPY;  Surgeon: Inda Castle, MD;  Location: [facility];  Service: Endoscopy;  Laterality: N/A;  gi bleed  . Esophagogastroduodenoscopy  07/30/2011    Procedure: ESOPHAGOGASTRODUODENOSCOPY (EGD);  Surgeon: Inda Castle, MD;  Location: [facility];  Service: Endoscopy;  Laterality: N/A;  . Pacemaker insertion     Family History  Problem Relation Age of Onset  . Cancer Father    History  Substance Use Topics  . Smoking status: Current Every Day Smoker -- 0.50 packs/day for 65 years    Types: Cigarettes  . Smokeless tobacco: Current User  . Alcohol Use: No   OB History   Grav Para Term Preterm Abortions TAB SAB Ect Mult Living                 Review of Systems  Constitutional: Negative for fever and chills.  Respiratory: Negative for cough, chest tightness and shortness of breath.   Cardiovascular: Negative for chest pain, palpitations and leg swelling.  Gastrointestinal: Negative for nausea, vomiting, abdominal pain and diarrhea.  Genitourinary: Negative for dysuria, flank pain and pelvic pain.  Musculoskeletal: Negative for arthralgias, myalgias, neck pain and neck stiffness.  Skin: Negative for rash.  Neurological: Negative for dizziness, weakness and headaches.  All other systems reviewed and are negative.     Allergies  Peanuts; Norvasc; and Peanut-containing drug products  Home Medications   Prior to Admission  medications   Medication Sig Start Date End Date Taking? Authorizing Provider  acetaminophen-codeine (TYLENOL #3) 300-30 MG per tablet Take 1 tablet by mouth every 6 (six) hours as needed for moderate pain. 08/09/13   Ripudeep Krystal Eaton, MD  ALPRAZolam Duanne Moron) 0.5 MG tablet Take 0.5 mg by mouth 3 (three) times daily as needed for anxiety.  05/30/13   Historical Provider, MD  aspirin EC 81 MG tablet Take 81 mg by mouth daily.    Historical Provider, MD  ciprofloxacin (CIPRO) 250 MG tablet Take 1 tablet (250 mg total) by mouth 2 (two) times daily. X 3days 08/09/13   Ripudeep Krystal Eaton, MD  cloNIDine (CATAPRES) 0.2 MG  tablet Take 1 tablet (0.2 mg total) by mouth 3 (three) times daily. 08/09/13   Ripudeep Krystal Eaton, MD  clopidogrel (PLAVIX) 75 MG tablet Take 75 mg by mouth daily. 08/01/13   Historical Provider, MD  ergocalciferol (VITAMIN D2) 50000 UNITS capsule Take 50,000 Units by mouth 2 (two) times a week. Tuesday and friday    Historical Provider, MD  furosemide (LASIX) 20 MG tablet Take 1 tablet (20 mg total) by mouth daily. 08/10/13   Ripudeep Krystal Eaton, MD  hydrALAZINE (APRESOLINE) 100 MG tablet Take 1 tablet (100 mg total) by mouth 4 (four) times daily. 08/09/13   Ripudeep Krystal Eaton, MD  insulin NPH-regular Human (NOVOLIN 70/30) (70-30) 100 UNIT/ML injection Inject 8 Units into the skin 2 (two) times daily with a meal. 04/08/13   Eugenie Filler, MD  isosorbide mononitrate (IMDUR) 120 MG 24 hr tablet Take 1 tablet (120 mg total) by mouth daily. 12/12/12   Annita Brod, MD  LOVAZA 1 G capsule Take 1 g by mouth 2 (two) times daily.  04/12/13   Historical Provider, MD  metoprolol tartrate (LOPRESSOR) 25 MG tablet Take 1 tablet (25 mg total) by mouth 2 (two) times daily. 08/09/13   Ripudeep Krystal Eaton, MD  potassium chloride SA (K-DUR,KLOR-CON) 10 MEQ tablet Take 1 tablet (10 mEq total) by mouth daily. 08/10/13   Ripudeep K Rai, MD   BP 183/87  Pulse 63  Temp(Src) 97.7 F (36.5 C) (Oral)  Resp 16  SpO2 99% Physical Exam  Nursing note and vitals reviewed. Constitutional: She is oriented to person, place, and time. She appears well-developed and well-nourished. No distress.  HENT:  Head: Normocephalic.  Eyes: Conjunctivae are normal.  Neck: Neck supple.  Cardiovascular: Normal rate, regular rhythm and normal heart sounds.   Pulmonary/Chest: Effort normal and breath sounds normal. No respiratory distress. She has no wheezes. She has no rales.  Abdominal: Soft. Bowel sounds are normal. She exhibits no distension. There is no tenderness. There is no rebound.  Musculoskeletal: She exhibits no edema.  Neurological: She is alert  and oriented to person, place, and time.  Skin: Skin is warm and dry.  Psychiatric: She has a normal mood and affect. Her behavior is normal.    ED Course  Procedures (including critical care time) Labs Review Labs Reviewed  CBC WITH DIFFERENTIAL - Abnormal; Notable for the following:    WBC 3.3 (*)    RBC 3.81 (*)    Neutro Abs 1.6 (*)    All other components within normal limits  COMPREHENSIVE METABOLIC PANEL - Abnormal; Notable for the following:    Potassium 3.3 (*)    Glucose, Bld 108 (*)    Albumin 2.9 (*)    Total Bilirubin 0.2 (*)    GFR calc non Af Amer 52 (*)  GFR calc Af Amer 60 (*)    All other components within normal limits  I-STAT TROPOININ, ED    Imaging Review No results found.   EKG Interpretation None      Date: 08/14/2013  Rate: 60  Rhythm: normal sinus rhythm and premature ventricular contractions (PVC)  QRS Axis: normal  Intervals: normal  ST/T Wave abnormalities: nonspecific T wave changes  Conduction Disutrbances:none  Narrative Interpretation:   Old EKG Reviewed: unchanged    MDM   Final diagnoses:  Hypertension    Patient in emergency department with elevated blood pressure at home. Blood pressure home 623 systolic. Here initial blood pressure is 180/93. Patient completely asymptomatic. She denies any headache, chest pain, shortness of breath, swelling in extremities. Recent admission for hypertensive urgency and renal insufficiency after a fall and rhabdomyolysis and elevated LFTs. Patient states she has been doing well since discharge. Will check blood work including LFTs, renal function, EKG, troponin.   EKG unchanged, troponin negative, renal function is normal at this time. Patient's LFTs are normal. There is no evidence of endorgan damage. She is completely asymptomatic. Blood pressure is chronically elevated, given her regular dose of her afternoon clonidine, blood pressure went down to 174/70. Discussed with Dr. Jeanell Sparrow. Will d/c  home with follow up.   Filed Vitals:   08/14/13 1415 08/14/13 1430 08/14/13 1448 08/14/13 1454  BP: 173/68 191/76 201/69 174/70  Pulse: 59 58 73 65  Temp:      TempSrc:      Resp: 17 19 16 17   SpO2: 97% 96% 96% 94%       Renold Genta, PA-C 08/14/13 1537

## 2013-08-14 NOTE — ED Notes (Signed)
Pt brought back to room via wheelchair with family in tow; pt getting undressed and into a gown; Judson Roch, RN aware

## 2013-08-14 NOTE — ED Notes (Addendum)
Pt in stating she was told to come in today by her home health nurse due to elevated BP at home, pt states her BP is normally this high and she took her medications per normal. Denies complaints. Pt with recent hospital admission at [facility] for similar problems.

## 2013-08-14 NOTE — Discharge Instructions (Signed)
Continue to take all regular medications. Follow up with your doctor as soon as able for medication adjustment for your hypertension.    Hypertension As your heart beats, it forces blood through your arteries. This force is your blood pressure. If the pressure is too high, it is called hypertension (HTN) or high blood pressure. HTN is dangerous because you may have it and not know it. High blood pressure may mean that your heart has to work harder to pump blood. Your arteries may be narrow or stiff. The extra work puts you at risk for heart disease, stroke, and other problems.  Blood pressure consists of two numbers, a higher number over a lower, 110/72, for example. It is stated as "110 over 72." The ideal is below 120 for the top number (systolic) and under 80 for the bottom (diastolic). Write down your blood pressure today. You should pay close attention to your blood pressure if you have certain conditions such as:  Heart failure.  Prior heart attack.  Diabetes  Chronic kidney disease.  Prior stroke.  Multiple risk factors for heart disease. To see if you have HTN, your blood pressure should be measured while you are seated with your arm held at the level of the heart. It should be measured at least twice. A one-time elevated blood pressure reading (especially in the Emergency Department) does not mean that you need treatment. There may be conditions in which the blood pressure is different between your right and left arms. It is important to see your caregiver soon for a recheck. Most people have essential hypertension which means that there is not a specific cause. This type of high blood pressure may be lowered by changing lifestyle factors such as:  Stress.  Smoking.  Lack of exercise.  Excessive weight.  Drug/tobacco/alcohol use.  Eating less salt. Most people do not have symptoms from high blood pressure until it has caused damage to the body. Effective treatment can often  prevent, delay or reduce that damage. TREATMENT  When a cause has been identified, treatment for high blood pressure is directed at the cause. There are a large number of medications to treat HTN. These fall into several categories, and your caregiver will help you select the medicines that are best for you. Medications may have side effects. You should review side effects with your caregiver. If your blood pressure stays high after you have made lifestyle changes or started on medicines,   Your medication(s) may need to be changed.  Other problems may need to be addressed.  Be certain you understand your prescriptions, and know how and when to take your medicine.  Be sure to follow up with your caregiver within the time frame advised (usually within two weeks) to have your blood pressure rechecked and to review your medications.  If you are taking more than one medicine to lower your blood pressure, make sure you know how and at what times they should be taken. Taking two medicines at the same time can result in blood pressure that is too low. SEEK IMMEDIATE MEDICAL CARE IF:  You develop a severe headache, blurred or changing vision, or confusion.  You have unusual weakness or numbness, or a faint feeling.  You have severe chest or abdominal pain, vomiting, or breathing problems. MAKE SURE YOU:   Understand these instructions.  Will watch your condition.  Will get help right away if you are not doing well or get worse. Document Released: 02/22/2005 Document Revised: 05/17/2011 Document  Reviewed: 10/13/2007 ExitCare Patient Information 2014 [place], [place].

## 2013-08-18 NOTE — ED Provider Notes (Signed)
80 y.o. Female with known history of hypertension.  She denies any symptoms such as headache, chest pain or dyspnea.  She is advised to continue current meds and keep bp diary and f/u with primary md.  Filed Vitals:   08/14/13 1515  BP: 126/58  Pulse: 65  Temp:   Resp: [place], MD 08/18/13 1734

## 2013-08-24 ENCOUNTER — Encounter: Payer: Self-pay | Admitting: *Deleted

## 2013-12-03 ENCOUNTER — Encounter (HOSPITAL_COMMUNITY): Payer: Self-pay | Admitting: Emergency Medicine

## 2013-12-03 ENCOUNTER — Emergency Department (HOSPITAL_COMMUNITY)
Admission: EM | Admit: 2013-12-03 | Discharge: 2013-12-03 | Disposition: A | Discharge status: Home / Self Care | Payer: Medicare Other | Attending: Emergency Medicine | Admitting: Emergency Medicine

## 2013-12-03 DIAGNOSIS — Z8742 Personal history of other diseases of the female genital tract: Secondary | ICD-10-CM | POA: Insufficient documentation

## 2013-12-03 DIAGNOSIS — X58XXXA Exposure to other specified factors, initial encounter: Secondary | ICD-10-CM | POA: Insufficient documentation

## 2013-12-03 DIAGNOSIS — F17200 Nicotine dependence, unspecified, uncomplicated: Secondary | ICD-10-CM | POA: Diagnosis not present

## 2013-12-03 DIAGNOSIS — Z794 Long term (current) use of insulin: Secondary | ICD-10-CM | POA: Insufficient documentation

## 2013-12-03 DIAGNOSIS — I2510 Atherosclerotic heart disease of native coronary artery without angina pectoris: Secondary | ICD-10-CM | POA: Diagnosis not present

## 2013-12-03 DIAGNOSIS — R21 Rash and other nonspecific skin eruption: Secondary | ICD-10-CM | POA: Diagnosis present

## 2013-12-03 DIAGNOSIS — F411 Generalized anxiety disorder: Secondary | ICD-10-CM | POA: Diagnosis not present

## 2013-12-03 DIAGNOSIS — Y929 Unspecified place or not applicable: Secondary | ICD-10-CM | POA: Insufficient documentation

## 2013-12-03 DIAGNOSIS — Z8673 Personal history of transient ischemic attack (TIA), and cerebral infarction without residual deficits: Secondary | ICD-10-CM | POA: Insufficient documentation

## 2013-12-03 DIAGNOSIS — Z7982 Long term (current) use of aspirin: Secondary | ICD-10-CM | POA: Insufficient documentation

## 2013-12-03 DIAGNOSIS — IMO0002 Reserved for concepts with insufficient information to code with codable children: Secondary | ICD-10-CM | POA: Diagnosis not present

## 2013-12-03 DIAGNOSIS — Y939 Activity, unspecified: Secondary | ICD-10-CM | POA: Diagnosis not present

## 2013-12-03 DIAGNOSIS — E119 Type 2 diabetes mellitus without complications: Secondary | ICD-10-CM | POA: Diagnosis not present

## 2013-12-03 DIAGNOSIS — S40812A Abrasion of left upper arm, initial encounter: Secondary | ICD-10-CM

## 2013-12-03 DIAGNOSIS — Z950 Presence of cardiac pacemaker: Secondary | ICD-10-CM | POA: Diagnosis not present

## 2013-12-03 DIAGNOSIS — I10 Essential (primary) hypertension: Secondary | ICD-10-CM | POA: Diagnosis not present

## 2013-12-03 DIAGNOSIS — Z79899 Other long term (current) drug therapy: Secondary | ICD-10-CM | POA: Diagnosis not present

## 2013-12-03 DIAGNOSIS — M129 Arthropathy, unspecified: Secondary | ICD-10-CM | POA: Insufficient documentation

## 2013-12-03 MED ORDER — MUPIROCIN CALCIUM 2 % EX CREA: 1.0000 "application " | TOPICAL_CREAM | Freq: Two times a day (BID) | CUTANEOUS | Status: DC

## 2013-12-03 MED ORDER — ACETAMINOPHEN-CODEINE #3 300-30 MG PO TABS: 1.0000 | ORAL_TABLET | Freq: Four times a day (QID) | ORAL | Status: DC | PRN

## 2013-12-03 NOTE — ED Notes (Signed)
Pt has rash to right arm area sts  2 weeks. sts itching and slight swelling to arm. Scabs present

## 2013-12-03 NOTE — Discharge Instructions (Signed)

## 2013-12-03 NOTE — ED Provider Notes (Signed)
Medical screening examination/treatment/procedure(s) were performed by non-physician practitioner and as supervising physician I was immediately available for consultation/collaboration.   EKG Interpretation None        Evelina Bucy, MD 12/03/13 1623

## 2013-12-03 NOTE — ED Provider Notes (Signed)
CSN: [id number]     Arrival date & time 12/03/13  1035 History   This chart was scribed for Alyse Low, PA-C, working with No att. providers found by Marti Sleigh, ED Scribe. This patient was seen in room TR05C/TR05C and the patient's care was started at 11:55 AM.     Chief Complaint  Patient presents with  . Rash   The history is provided by the patient. No language interpreter was used.    HPI Comments: Carmen [name] is a 80 y.o. female with a history of pacemaker placement who presents to the Emergency Department complaining of a worsening rash on her right arm with associated swelling and excoriation that started two weeks ago. Pt denies rash on any other part of the body. She applied Listerine to the area with no relief to her symptoms. The pt's PCP is Dr. Baird Cancer.   Past Medical History  Diagnosis Date  . Diabetes mellitus   . Hypertension   . Coronary artery disease   . Renal disorder   . Herniated lumbar intervertebral disc   . Cataract     cataract surgery scheduled for 03/31/11, 2 - left eye, 1 - right eye  . Renovascular hypertension      s/p left renal artery stent 12/2007.  S/P balloon angioplasty on 02/16/10 for ISR, BP was  controlled well since then.     . Claudication in peripheral vascular disease     02/16/10: Left CIA 9.0x28 Omnilink and REIA 8.0x40 seff expanding [place]ilver.   right subclavian artery stent 03/18/2008,  . S/P carotid endarterectomy      left carotid endarterectomy  1991; Stroke and TIA in 1999 with right sided weakness, now with residual right arm weakness  . Stroke     TIA  . Peripheral vascular disease   . Anxiety   . Arthritis   . Pacemaker    Past Surgical History  Procedure Laterality Date  . Appendectomy    . Abdominal hysterectomy    . Ureteral stent placement    . Carotid endarterectomy    . Laminectomy    . Back surgery    . Colonoscopy  07/30/2011    Procedure: COLONOSCOPY;  Surgeon: Inda Castle, MD;  Location: [facility];   Service: Endoscopy;  Laterality: N/A;  gi bleed  . Esophagogastroduodenoscopy  07/30/2011    Procedure: ESOPHAGOGASTRODUODENOSCOPY (EGD);  Surgeon: Inda Castle, MD;  Location: [facility];  Service: Endoscopy;  Laterality: N/A;  . Pacemaker insertion     Family History  Problem Relation Age of Onset  . Cancer Father    History  Substance Use Topics  . Smoking status: Current Every Day Smoker -- 0.50 packs/day for 65 years    Types: Cigarettes  . Smokeless tobacco: Current User  . Alcohol Use: No   OB History   Grav Para Term Preterm Abortions TAB SAB Ect Mult Living                 Review of Systems  Skin: Positive for rash.  All other systems reviewed and are negative.     Allergies  Peanuts; Norvasc; and Peanut-containing drug products  Home Medications   Prior to Admission medications   Medication Sig Start Date End Date Taking? Authorizing Provider  acetaminophen-codeine (TYLENOL #3) 300-30 MG per tablet Take 1 tablet by mouth every 6 (six) hours as needed for moderate pain. 08/09/13   Ripudeep Krystal Eaton, MD  ALPRAZolam Duanne Moron) 0.5 MG tablet Take 0.5 mg  by mouth 3 (three) times daily as needed for anxiety.  05/30/13   Historical Provider, MD  aspirin EC 81 MG tablet Take 81 mg by mouth daily.    Historical Provider, MD  ciprofloxacin (CIPRO) 250 MG tablet Take 1 tablet (250 mg total) by mouth 2 (two) times daily. X 3days 08/09/13   Ripudeep Krystal Eaton, MD  cloNIDine (CATAPRES) 0.2 MG tablet Take 1 tablet (0.2 mg total) by mouth 3 (three) times daily. 08/09/13   Ripudeep Krystal Eaton, MD  clopidogrel (PLAVIX) 75 MG tablet Take 75 mg by mouth daily. 08/01/13   Historical Provider, MD  CRESTOR 10 MG tablet Take 10 mg by mouth daily. 08/01/13   Historical Provider, MD  ergocalciferol (VITAMIN D2) 50000 UNITS capsule Take 50,000 Units by mouth 2 (two) times a week. Tuesday and friday    Historical Provider, MD  furosemide (LASIX) 20 MG tablet Take 1 tablet (20 mg total) by mouth daily. 08/10/13    Ripudeep Krystal Eaton, MD  hydrALAZINE (APRESOLINE) 100 MG tablet Take 1 tablet (100 mg total) by mouth 4 (four) times daily. 08/09/13   Ripudeep Krystal Eaton, MD  insulin NPH-regular Human (NOVOLIN 70/30) (70-30) 100 UNIT/ML injection Inject 8 Units into the skin 2 (two) times daily with a meal. 04/08/13   Eugenie Filler, MD  isosorbide mononitrate (IMDUR) 120 MG 24 hr tablet Take 1 tablet (120 mg total) by mouth daily. 12/12/12   Annita Brod, MD  LOVAZA 1 G capsule Take 1 g by mouth 2 (two) times daily.  04/12/13   Historical Provider, MD  metoprolol tartrate (LOPRESSOR) 25 MG tablet Take 1 tablet (25 mg total) by mouth 2 (two) times daily. 08/09/13   Ripudeep Krystal Eaton, MD  potassium chloride SA (K-DUR,KLOR-CON) 10 MEQ tablet Take 1 tablet (10 mEq total) by mouth daily. 08/10/13   Ripudeep Krystal Eaton, MD  spironolactone (ALDACTONE) 50 MG tablet Take 50 mg by mouth daily. 08/01/13   Historical Provider, MD   BP 174/63  Pulse 60  Temp(Src) 98.3 F (36.8 C)  Resp 18  SpO2 98% Physical Exam  Nursing note and vitals reviewed. Constitutional: She is oriented to person, place, and time. She appears well-developed and well-nourished.  HENT:  Head: Normocephalic and atraumatic.  Eyes: Conjunctivae and EOM are normal. Pupils are equal, round, and reactive to light.  Neck: Normal range of motion. Neck supple.  Cardiovascular: Normal rate, regular rhythm and normal heart sounds.   Pulmonary/Chest: Effort normal and breath sounds normal.  Abdominal: Soft. Bowel sounds are normal.  Musculoskeletal: Normal range of motion.  Neurological: She is alert and oriented to person, place, and time.  Skin: Skin is warm and dry. Rash noted. There is erythema.  Superficial abrasion right arm, scabbed. Erythema surrounding area.  Psychiatric: She has a normal mood and affect. Her behavior is normal.    ED Course  Procedures (including critical care time) DIAGNOSTIC STUDIES: Oxygen Saturation is 98% on room air, normal by my  interpretation.    COORDINATION OF CARE: 11:58 AM Will prescribe prescription topical abx cream. She agreed to treatment plan.  Labs Review Labs Reviewed - No data to display  Imaging Review No results found.   EKG Interpretation None      MDM   Final diagnoses:  Abrasion of left arm, initial encounter    bactroban ointment to area Tylenol with codeine    Fransico Meadow, PA-C 12/03/13 1521

## 2013-12-03 NOTE — ED Notes (Signed)
PT and family is requesting treatment for chronic back pain.

## 2014-01-27 IMAGING — CT CT ANGIO HEAD
3 of 5 series · 10 of 33 positions shown · IV contrast (CONTRAST)
Comparison: 10/15/2011 head CT.

CTA NECK

CLINICAL DATA: Diabetic hypertensive patient with renal disorder
and coronary artery disease.  Altered mental status.  No new
neurological findings otherwise noted. Pacemaker and cannot have
MR.  Jisho carotid endarterectomy 4664.

CT ANGIOGRAPHY HEAD AND NECK
TECHNIQUE: Multidetector CT imaging of the head and neck was
performed using the standard protocol during bolus administration
of intravenous contrast.  Multiplanar CT image reconstructions
including MIPs were obtained to evaluate the vascular anatomy.
Carotid stenosis measurements (when applicable) are obtained
utilizing NASCET criteria, using the distal internal carotid
diameter as the denominator.
Contrast: 50mL OMNIPAQUE IOHEXOL 350 MG/ML SOLN

[Series 6: carotid · axial · 0.43mm/px · z∈[+177,+295]mm · 2 of 174 slices shown]
[im 58/174  soft-tissue]
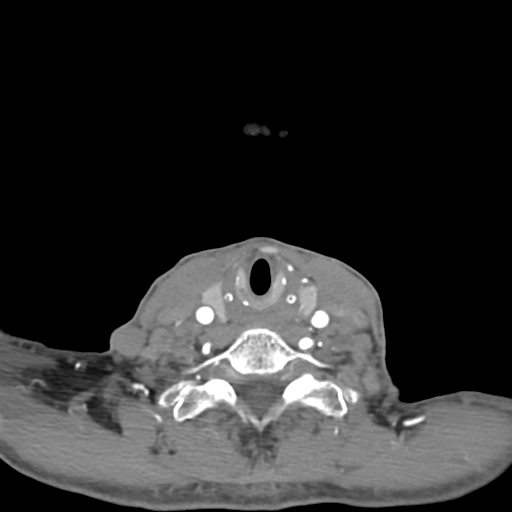
[im 116/174  soft-tissue]
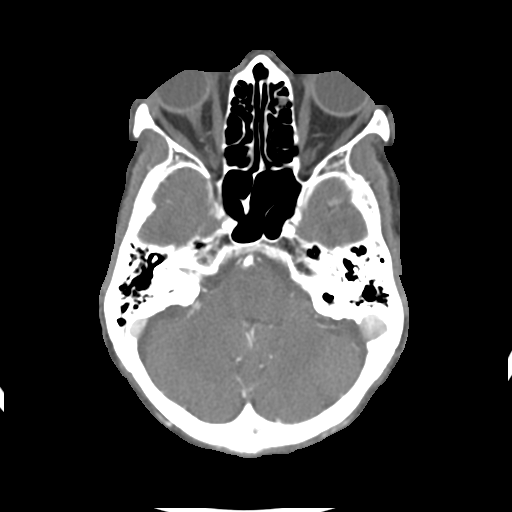

[Series 8079: mpr, sag 1x1 mpr, sagittal · sagittal · 0.68mm/px · 1 of 147 slices shown]
[im 74/147  soft-tissue]
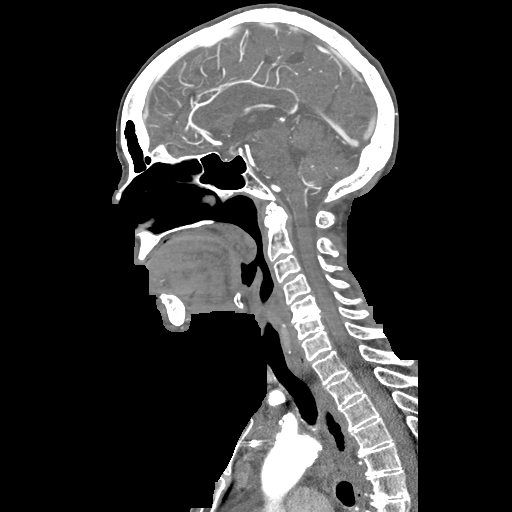

[mpr, ax 1x1 mpr, axial · axial · 0.43mm/px · z∈[+106,+368]mm · 7 of 341 slices shown]
[im 43/341  soft-tissue]
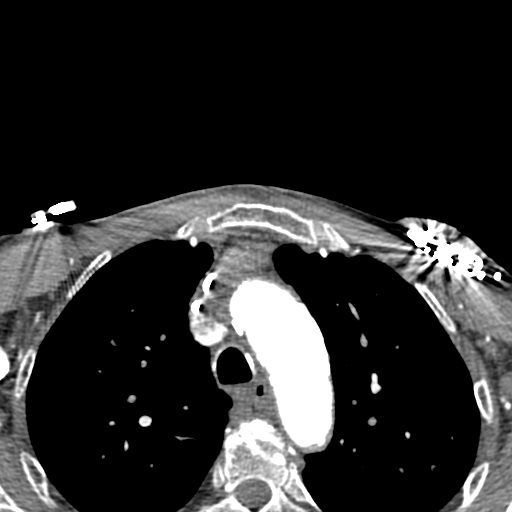
[im 86/341  bone]
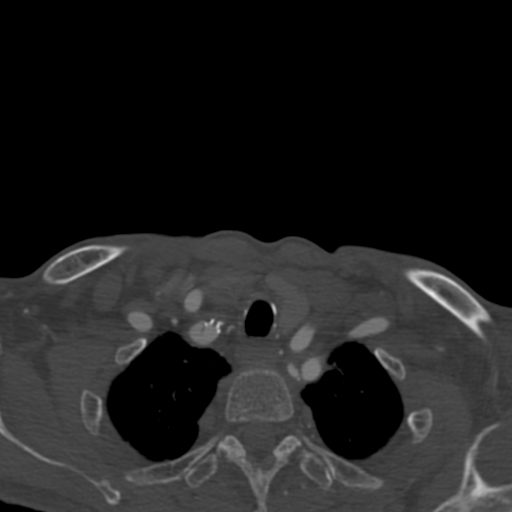
[im 128/341  soft-tissue]
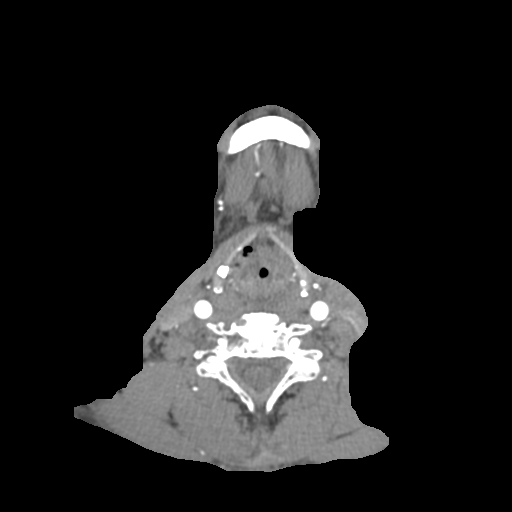
[im 171/341  bone]
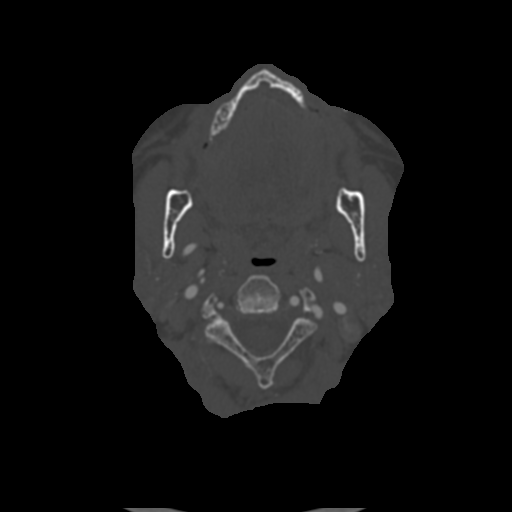
[im 213/341  soft-tissue]
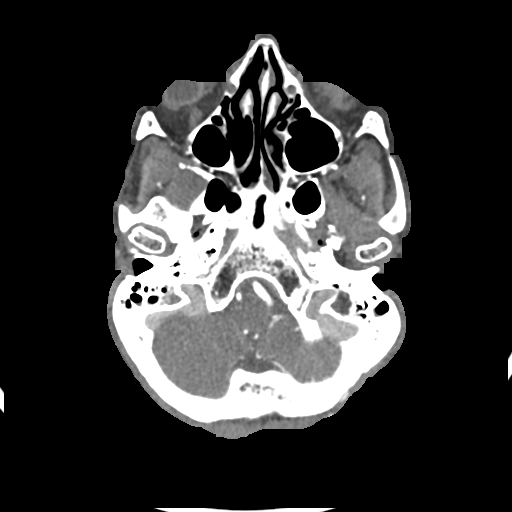
[im 256/341  bone]
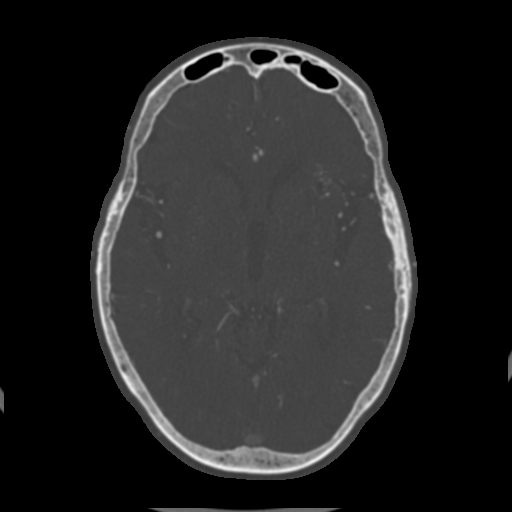
[im 298/341  soft-tissue]
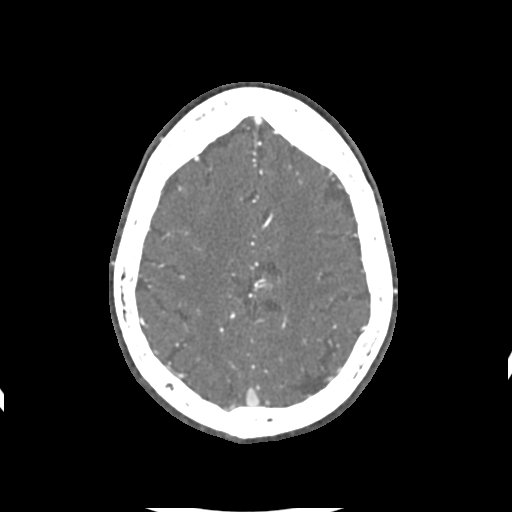

[10 of 33 positions shown; findings below may reference images not displayed]

FINDINGS: Pacemaker in place.

Atherosclerotic type changes aortic arch with ectasia.

Normal configuration of the origin of the great vessels from the
aortic arch..

Mild narrowing proximal innominate artery.  Mild to moderate
narrowing 2 cm above the origin of the innominate artery.

Stent within the proximal right subclavian artery with moderate
narrowing.

Mild narrowing proximal right common carotid artery.

Plaque proximal left internal carotid artery with narrowing most
notable 1.8 cm above the origin of the left internal carotid artery
where there is a shelf-like plaque with focal 62% diameter
stenosis.  Ectatic vertical cervical segment of the right internal
carotid artery with fold at the C1 level with narrowing.

Calcification with mild narrowing proximal left common carotid
artery.  Fold 4 cm beyond its origin with mild narrowing.  Prior
left carotid endarterectomy.  Calcification along the
endarterectomy site with mild narrowing but without hemodynamically
significant stenosis.  Ectatic vertical segment left internal
carotid artery.

Moderate to slightly marked narrowing proximal right vertebral
artery.

Mild irregularity left subclavian artery without significant
narrowing.

The left vertebral artery is dominant.  Fold of the left vertebral
artery 2 cm above its origin with plaque and mild narrowing.

Degenerative changes cervical spine most notable C5-6 and less so
C4-5.  Mild spinal stenosis with minimal cord contact at both of
these levels.

Small nodules in the thyroid gland largest left lobe measuring 6
mm.

Nonspecific 1 cm low density structure extends from the base of the
right palatine tonsil to the epiglottis.

 Review of the MIP images confirms the above findings.
IMPRESSION: Stent within the proximal right subclavian artery with moderate
narrowing.

Plaque proximal left internal carotid artery with narrowing most
notable 1.8 cm above the origin of the left internal carotid artery
where there is a shelf-like plaque with focal 62% diameter
stenosis.

Prior left carotid endarterectomy.  Calcification along the
endarterectomy site with mild narrowing but without hemodynamically
significant stenosis.

Moderate to slightly marked narrowing proximal right vertebral
artery.

Please see above for additional findings.

CTA HEAD
FINDINGS: Since the recent CT examination, there is loss of sulci
in the superior right convexity (series 2 images 24 through 26)
with mild enhancement suggesting acute to posterior right frontal
lobe infarct.  As the patient has a arteriovenous malformation of
the anterior left opercular region, one could raise possibility of
subarachnoid hemorrhage causing loss of sulci in the right frontal
lobe however, there is no subarachnoid hemorrhage surrounding the
left frontal opercular arteriovenous malformation and subarachnoid
hemorrhage is felt to be a much less likely consideration for the
right frontal lobe findings.  Encephalitis also felt unlikely (to
be considered if of clinical concern). Tumor felt unlikely.

Prominent tangle of vessels anterior left opercular region
consistent with arteriovenous malformation. No adjacent
subarachnoid hemorrhage.

Right pontine small infarct appears remote.

Remote right thalamic infarct.

Small vessel disease type changes.

Global atrophy without hydrocephalus.

Atherosclerotic type changes cavernous segment of the internal
carotid artery bilaterally with mild to slightly moderate
narrowing.

Mild irregularity and narrowing of the M1 segment of the right
middle cerebral artery with mild to slightly moderate narrowing
right middle cerebral artery bifurcation.

After the takeoff of the right PICA, small right vertebral artery.

Mild narrowing distal left vertebral artery after the takeoff of
the left PICA.

Slight irregularity of small caliber basilar artery without high-
grade stenosis.

 Review of the MIP images confirms the above findings.
IMPRESSION: Acute right frontal lobe infarct suspected as detailed above.

Left frontal opercular region arteriovenous malformation spans over
2.3 cm.

Remote infarcts, small vessel disease type changes and intracranial
atherosclerotic type changes as detailed above.

Critical Value/emergent results were called by telephone at the
time of interpretation on 10/17/2011 at [DATE] p.m. to Dr. Monzee, who
verbally acknowledged these results.

## 2014-02-14 ENCOUNTER — Encounter (HOSPITAL_COMMUNITY): Payer: Self-pay | Admitting: Internal Medicine

## 2014-02-21 IMAGING — CR DG CHEST 1V PORT
1 series · 1 of 1 positions shown · non-contrast
Comparison: Portable exam 0600 hours compared to 10/15/2011

CLINICAL DATA: Hyperglycemia, shortness of breath, weakness,
history diabetes, hypertension, coronary artery disease

PORTABLE CHEST - 1 VIEW

[AP]
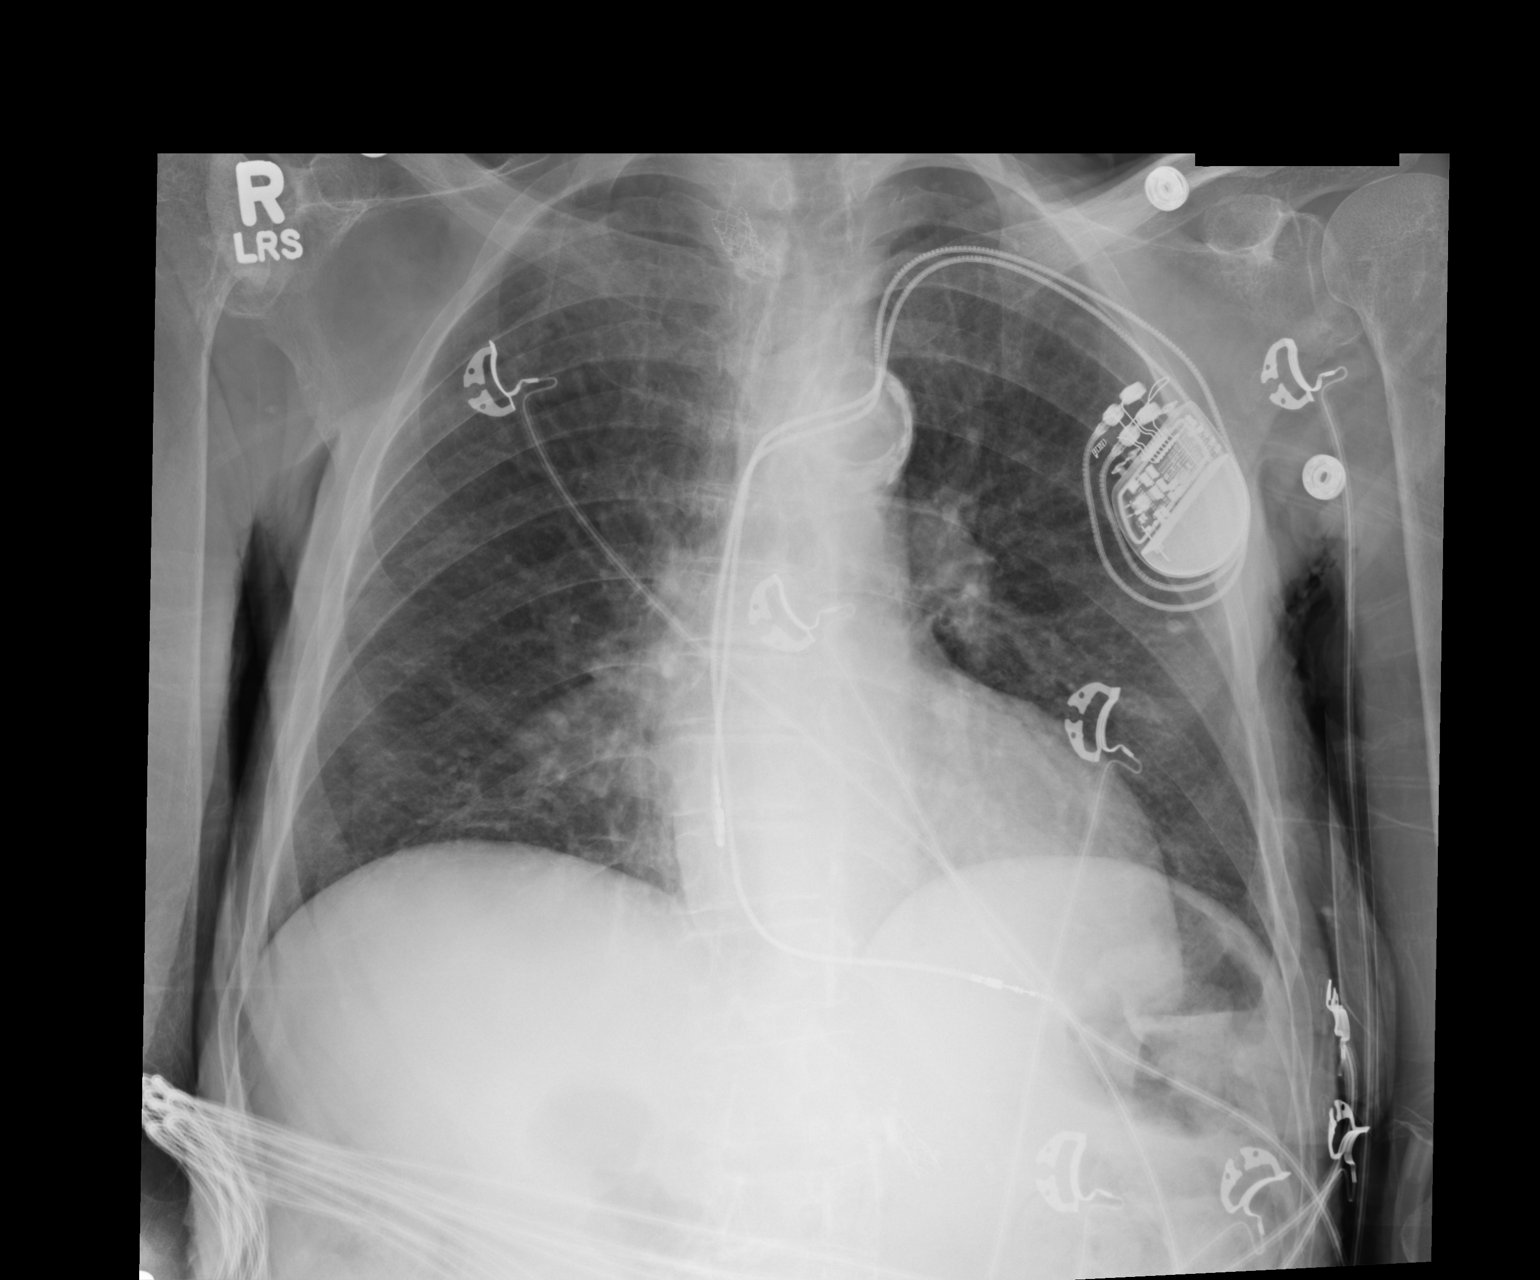

[1 of 1 positions shown; findings below may reference images not displayed]

FINDINGS: Left subclavian sequential transvenous pacemaker leads project at
right atrium and right ventricle.
Upper normal heart size.
Atherosclerotic calcification aorta.
Mediastinal contours and pulmonary vascularity normal.
Lungs appear emphysematous with minimal right basilar atelectasis.
No definite infiltrate, pleural effusion or pneumothorax.
Wall stent identified at the right superior mediastinum.
Bones appear demineralized.
IMPRESSION: Question emphysematous changes with right basilar atelectasis.

## 2014-09-11 ENCOUNTER — Encounter (HOSPITAL_COMMUNITY): Payer: Self-pay | Admitting: Emergency Medicine

## 2014-09-11 ENCOUNTER — Emergency Department (HOSPITAL_COMMUNITY): Payer: Medicare Other

## 2014-09-11 ENCOUNTER — Inpatient Hospital Stay (HOSPITAL_COMMUNITY)
Admission: EM | Admit: 2014-09-11 | Discharge: 2014-09-16 | DRG: 291 | Disposition: A | Discharge status: Home Health | Payer: Medicare Other | Attending: Internal Medicine | Admitting: Internal Medicine

## 2014-09-11 DIAGNOSIS — Z9111 Patient's noncompliance with dietary regimen: Secondary | ICD-10-CM | POA: Diagnosis present

## 2014-09-11 DIAGNOSIS — Z794 Long term (current) use of insulin: Secondary | ICD-10-CM | POA: Diagnosis not present

## 2014-09-11 DIAGNOSIS — Z7982 Long term (current) use of aspirin: Secondary | ICD-10-CM

## 2014-09-11 DIAGNOSIS — I129 Hypertensive chronic kidney disease with stage 1 through stage 4 chronic kidney disease, or unspecified chronic kidney disease: Secondary | ICD-10-CM | POA: Diagnosis present

## 2014-09-11 DIAGNOSIS — I5031 Acute diastolic (congestive) heart failure: Secondary | ICD-10-CM

## 2014-09-11 DIAGNOSIS — J9601 Acute respiratory failure with hypoxia: Secondary | ICD-10-CM | POA: Diagnosis present

## 2014-09-11 DIAGNOSIS — F17210 Nicotine dependence, cigarettes, uncomplicated: Secondary | ICD-10-CM | POA: Clinically undetermined

## 2014-09-11 DIAGNOSIS — N183 Chronic kidney disease, stage 3 unspecified: Secondary | ICD-10-CM | POA: Diagnosis present

## 2014-09-11 DIAGNOSIS — E1151 Type 2 diabetes mellitus with diabetic peripheral angiopathy without gangrene: Secondary | ICD-10-CM | POA: Diagnosis present

## 2014-09-11 DIAGNOSIS — E43 Unspecified severe protein-calorie malnutrition: Secondary | ICD-10-CM | POA: Diagnosis present

## 2014-09-11 DIAGNOSIS — Z79899 Other long term (current) drug therapy: Secondary | ICD-10-CM

## 2014-09-11 DIAGNOSIS — E1159 Type 2 diabetes mellitus with other circulatory complications: Secondary | ICD-10-CM

## 2014-09-11 DIAGNOSIS — E876 Hypokalemia: Secondary | ICD-10-CM

## 2014-09-11 DIAGNOSIS — I5033 Acute on chronic diastolic (congestive) heart failure: Secondary | ICD-10-CM | POA: Diagnosis present

## 2014-09-11 DIAGNOSIS — R0602 Shortness of breath: Secondary | ICD-10-CM

## 2014-09-11 DIAGNOSIS — Z681 Body mass index (BMI) 19 or less, adult: Secondary | ICD-10-CM

## 2014-09-11 DIAGNOSIS — J811 Chronic pulmonary edema: Secondary | ICD-10-CM | POA: Diagnosis present

## 2014-09-11 DIAGNOSIS — Z950 Presence of cardiac pacemaker: Secondary | ICD-10-CM | POA: Diagnosis not present

## 2014-09-11 DIAGNOSIS — I2510 Atherosclerotic heart disease of native coronary artery without angina pectoris: Secondary | ICD-10-CM | POA: Diagnosis present

## 2014-09-11 DIAGNOSIS — Z9119 Patient's noncompliance with other medical treatment and regimen: Secondary | ICD-10-CM | POA: Diagnosis present

## 2014-09-11 DIAGNOSIS — I69331 Monoplegia of upper limb following cerebral infarction affecting right dominant side: Secondary | ICD-10-CM

## 2014-09-11 DIAGNOSIS — E1165 Type 2 diabetes mellitus with hyperglycemia: Secondary | ICD-10-CM | POA: Diagnosis present

## 2014-09-11 DIAGNOSIS — N179 Acute kidney failure, unspecified: Secondary | ICD-10-CM | POA: Diagnosis present

## 2014-09-11 DIAGNOSIS — E119 Type 2 diabetes mellitus without complications: Secondary | ICD-10-CM | POA: Diagnosis present

## 2014-09-11 HISTORY — DX: Migraine, unspecified, not intractable, without status migrainosus: G43.909

## 2014-09-11 HISTORY — DX: Long term (current) use of insulin: Z79.4

## 2014-09-11 HISTORY — DX: Type 2 diabetes mellitus without complications: E11.9

## 2014-09-11 HISTORY — DX: Reserved for inherently not codable concepts without codable children: IMO0001

## 2014-09-11 HISTORY — DX: Pneumonia, unspecified organism: J18.9

## 2014-09-11 HISTORY — DX: Low back pain: M54.5

## 2014-09-11 HISTORY — DX: Other chronic pain: G89.29

## 2014-09-11 HISTORY — DX: Low back pain, unspecified: M54.50

## 2014-09-11 LAB — COMPREHENSIVE METABOLIC PANEL
ALT: 22 U/L (ref 14–54)
ANION GAP: 9 (ref 5–15)
AST: 22 U/L (ref 15–41)
Albumin: 2.7 g/dL — ABNORMAL LOW (ref 3.5–5.0)
Alkaline Phosphatase: 96 U/L (ref 38–126)
BUN: 14 mg/dL (ref 6–20)
CO2: 24 mmol/L (ref 22–32)
CREATININE: 1.28 mg/dL — AB (ref 0.44–1.00)
Calcium: 7.5 mg/dL — ABNORMAL LOW (ref 8.9–10.3)
Chloride: 106 mmol/L (ref 101–111)
GFR, EST AFRICAN AMERICAN: 45 mL/min — AB (ref >60)
GFR, EST NON AFRICAN AMERICAN: 39 mL/min — AB (ref >60)
Glucose, Bld: 145 mg/dL — ABNORMAL HIGH (ref 65–99)
Potassium: 2.8 mmol/L — ABNORMAL LOW (ref 3.5–5.1)
Sodium: 139 mmol/L (ref 135–145)
Total Bilirubin: 0.4 mg/dL (ref 0.3–1.2)
Total Protein: 6.7 g/dL (ref 6.5–8.1)

## 2014-09-11 LAB — CBC
HCT: 42.3 % (ref 36.0–46.0)
Hemoglobin: 14.4 g/dL (ref 12.0–15.0)
MCH: 32.4 pg (ref 26.0–34.0)
MCHC: 34 g/dL (ref 30.0–36.0)
MCV: 95.1 fL (ref 78.0–100.0)
Platelets: 210 10*3/uL (ref 150–400)
RBC: 4.45 MIL/uL (ref 3.87–5.11)
RDW: 13.8 % (ref 11.5–15.5)
WBC: 5.7 10*3/uL (ref 4.0–10.5)

## 2014-09-11 LAB — BRAIN NATRIURETIC PEPTIDE: B Natriuretic Peptide: 3381.1 pg/mL — ABNORMAL HIGH (ref 0.0–100.0)

## 2014-09-11 LAB — MAGNESIUM
MAGNESIUM: 1.7 mg/dL (ref 1.7–2.4)
Magnesium: 1.5 mg/dL — ABNORMAL LOW (ref 1.7–2.4)

## 2014-09-11 LAB — GLUCOSE, CAPILLARY: Glucose-Capillary: 309 mg/dL — ABNORMAL HIGH (ref 65–99)

## 2014-09-11 LAB — TROPONIN I: TROPONIN I: 0.04 ng/mL — AB (ref <0.031)

## 2014-09-11 MED ORDER — ENOXAPARIN SODIUM 40 MG/0.4ML ~~LOC~~ SOLN
40.0000 mg | SUBCUTANEOUS | Status: DC
  Administered 2014-09-11 – 2014-09-13 (×3): 40 mg via SUBCUTANEOUS
  Filled 2014-09-11 (×4): qty 0.4

## 2014-09-11 MED ORDER — ISOSORBIDE MONONITRATE ER 60 MG PO TB24
120.0000 mg | ORAL_TABLET | Freq: Every day | ORAL | Status: DC
  Filled 2014-09-11 (×2): qty 2

## 2014-09-11 MED ORDER — POTASSIUM CHLORIDE CRYS ER 20 MEQ PO TBCR
40.0000 meq | EXTENDED_RELEASE_TABLET | Freq: Two times a day (BID) | ORAL | Status: DC
  Administered 2014-09-11 – 2014-09-12 (×3): 40 meq via ORAL
  Filled 2014-09-11 (×6): qty 2

## 2014-09-11 MED ORDER — CLONIDINE HCL 0.2 MG PO TABS
0.2000 mg | ORAL_TABLET | Freq: Once | ORAL | Status: AC
  Administered 2014-09-11: 0.2 mg via ORAL
  Filled 2014-09-11: qty 1

## 2014-09-11 MED ORDER — INSULIN ASPART 100 UNIT/ML ~~LOC~~ SOLN
0.0000 [IU] | Freq: Three times a day (TID) | SUBCUTANEOUS | Status: DC
  Administered 2014-09-12 (×3): 2 [IU] via SUBCUTANEOUS
  Administered 2014-09-13: 7 [IU] via SUBCUTANEOUS
  Administered 2014-09-14: 2 [IU] via SUBCUTANEOUS
  Administered 2014-09-14 – 2014-09-15 (×2): 3 [IU] via SUBCUTANEOUS
  Administered 2014-09-15: 5 [IU] via SUBCUTANEOUS
  Administered 2014-09-16: 2 [IU] via SUBCUTANEOUS

## 2014-09-11 MED ORDER — NITROGLYCERIN 2 % TD OINT
1.0000 [in_us] | TOPICAL_OINTMENT | Freq: Once | TRANSDERMAL | Status: AC
  Administered 2014-09-11: 1 [in_us] via TOPICAL
  Filled 2014-09-11: qty 1

## 2014-09-11 MED ORDER — NITROGLYCERIN 0.4 MG SL SUBL
0.4000 mg | SUBLINGUAL_TABLET | Freq: Once | SUBLINGUAL | Status: AC
  Administered 2014-09-11: 0.4 mg via SUBLINGUAL
  Filled 2014-09-11: qty 1

## 2014-09-11 MED ORDER — INSULIN DETEMIR 100 UNIT/ML ~~LOC~~ SOLN
10.0000 [IU] | Freq: Every day | SUBCUTANEOUS | Status: DC
  Administered 2014-09-11 – 2014-09-12 (×2): 10 [IU] via SUBCUTANEOUS
  Filled 2014-09-11 (×3): qty 0.1

## 2014-09-11 MED ORDER — ACETAMINOPHEN 325 MG PO TABS
650.0000 mg | ORAL_TABLET | ORAL | Status: DC | PRN
  Administered 2014-09-13 – 2014-09-15 (×4): 650 mg via ORAL
  Filled 2014-09-11 (×3): qty 2

## 2014-09-11 MED ORDER — FUROSEMIDE 10 MG/ML IJ SOLN
40.0000 mg | Freq: Once | INTRAMUSCULAR | Status: AC
  Administered 2014-09-11: 40 mg via INTRAVENOUS
  Filled 2014-09-11: qty 4

## 2014-09-11 MED ORDER — LISINOPRIL 5 MG PO TABS
5.0000 mg | ORAL_TABLET | Freq: Every day | ORAL | Status: DC
  Filled 2014-09-11 (×2): qty 1

## 2014-09-11 MED ORDER — FUROSEMIDE 10 MG/ML IJ SOLN
40.0000 mg | Freq: Two times a day (BID) | INTRAMUSCULAR | Status: DC
Start: 2014-09-11 — End: 2014-09-12
  Administered 2014-09-12: 40 mg via INTRAVENOUS
  Filled 2014-09-11 (×3): qty 4

## 2014-09-11 MED ORDER — INSULIN ASPART 100 UNIT/ML ~~LOC~~ SOLN
0.0000 [IU] | Freq: Every day | SUBCUTANEOUS | Status: DC
  Administered 2014-09-11: 4 [IU] via SUBCUTANEOUS
  Administered 2014-09-13: 5 [IU] via SUBCUTANEOUS
  Administered 2014-09-15: 3 [IU] via SUBCUTANEOUS

## 2014-09-11 MED ORDER — SODIUM CHLORIDE 0.9 % IJ SOLN
3.0000 mL | Freq: Two times a day (BID) | INTRAMUSCULAR | Status: DC
  Administered 2014-09-11 – 2014-09-15 (×7): 3 mL via INTRAVENOUS

## 2014-09-11 MED ORDER — HYDRALAZINE HCL 50 MG PO TABS
100.0000 mg | ORAL_TABLET | Freq: Four times a day (QID) | ORAL | Status: DC
  Administered 2014-09-11: 100 mg via ORAL
  Filled 2014-09-11 (×5): qty 2

## 2014-09-11 MED ORDER — INSULIN ASPART 100 UNIT/ML ~~LOC~~ SOLN
3.0000 [IU] | Freq: Three times a day (TID) | SUBCUTANEOUS | Status: DC
  Administered 2014-09-12 – 2014-09-16 (×10): 3 [IU] via SUBCUTANEOUS

## 2014-09-11 MED ORDER — ACETAMINOPHEN-CODEINE #3 300-30 MG PO TABS
1.0000 | ORAL_TABLET | Freq: Four times a day (QID) | ORAL | Status: DC | PRN
  Administered 2014-09-12 – 2014-09-13 (×3): 1 via ORAL
  Filled 2014-09-11 (×4): qty 1

## 2014-09-11 MED ORDER — ASPIRIN EC 81 MG PO TBEC
81.0000 mg | DELAYED_RELEASE_TABLET | Freq: Every day | ORAL | Status: DC
  Administered 2014-09-12 – 2014-09-16 (×5): 81 mg via ORAL
  Filled 2014-09-11 (×6): qty 1

## 2014-09-11 MED ORDER — ALPRAZOLAM 0.5 MG PO TABS
0.5000 mg | ORAL_TABLET | Freq: Three times a day (TID) | ORAL | Status: DC | PRN
  Administered 2014-09-13: 0.5 mg via ORAL
  Filled 2014-09-11: qty 1

## 2014-09-11 MED ORDER — POTASSIUM CHLORIDE 10 MEQ/100ML IV SOLN
10.0000 meq | INTRAVENOUS | Status: AC
  Administered 2014-09-11 (×2): 10 meq via INTRAVENOUS
  Filled 2014-09-11 (×2): qty 100

## 2014-09-11 MED ORDER — POTASSIUM CHLORIDE CRYS ER 20 MEQ PO TBCR: 40.0000 meq | EXTENDED_RELEASE_TABLET | Freq: Once | ORAL | Status: AC

## 2014-09-11 MED ORDER — FUROSEMIDE 10 MG/ML IJ SOLN: 40.0000 mg | Freq: Once | INTRAMUSCULAR | Status: AC

## 2014-09-11 MED ORDER — SODIUM CHLORIDE 0.9 % IV SOLN: 250.0000 mL | INTRAVENOUS | Status: DC | PRN

## 2014-09-11 MED ORDER — SODIUM CHLORIDE 0.9 % IJ SOLN: 3.0000 mL | INTRAMUSCULAR | Status: DC | PRN

## 2014-09-11 MED ORDER — POTASSIUM CHLORIDE CRYS ER 20 MEQ PO TBCR
40.0000 meq | EXTENDED_RELEASE_TABLET | Freq: Once | ORAL | Status: AC
  Administered 2014-09-11: 40 meq via ORAL
  Filled 2014-09-11: qty 2

## 2014-09-11 MED ORDER — METOPROLOL TARTRATE 25 MG PO TABS
25.0000 mg | ORAL_TABLET | Freq: Two times a day (BID) | ORAL | Status: DC
  Administered 2014-09-11 – 2014-09-16 (×10): 25 mg via ORAL
  Filled 2014-09-11 (×11): qty 1

## 2014-09-11 MED ORDER — ONDANSETRON HCL 4 MG/2ML IJ SOLN: 4.0000 mg | Freq: Four times a day (QID) | INTRAMUSCULAR | Status: DC | PRN

## 2014-09-11 NOTE — ED Notes (Signed)
Sob increasing over past week, exp wheezes on right side, diminished on left.  Received 10 albuterol and 0.5 atrovent with ems, also received 125 solumedrol, no cp, no dizziness.

## 2014-09-11 NOTE — ED Provider Notes (Signed)
CSN: [id number]     Arrival date & time 09/11/14  1450 History   First MD Initiated Contact with Patient 09/11/14 1457     Chief Complaint  Patient presents with  . Shortness of Breath     (Consider location/radiation/quality/duration/timing/severity/associated sxs/prior Treatment) Patient is a 81 y.o. female presenting with shortness of breath. The history is provided by the patient and a relative.  Shortness of Breath Associated symptoms: no abdominal pain, no chest pain, no cough, no fever, no headaches, no neck pain, no rash, no sore throat and no vomiting   Patient w hx htn, chf, presents c/o increased sob during the past 2-3 days. Moderate, persistent, progressive. States has been compliant w normal meds. +smoker, denies hx asthma or copd, denies mdi use. Ems indicated wheezing, gave albuterol and atrovent neb, and solumedrol.  Pt states chest felt mildly tight earlier, denies current chest pain. ?hx cad. ?hx chf. States urinating of normal amount. ?mild increased ankle swelling bil. Denies orthopnea or pnd. Denies worsening cough or uri c/o. No fever or chills.       Past Medical History  Diagnosis Date  . Diabetes mellitus   . Hypertension   . Coronary artery disease   . Renal disorder   . Herniated lumbar intervertebral disc   . Cataract     cataract surgery scheduled for 03/31/11, 2 - left eye, 1 - right eye  . Renovascular hypertension      s/p left renal artery stent 12/2007.  S/P balloon angioplasty on 02/16/10 for ISR, BP was  controlled well since then.     . Claudication in peripheral vascular disease     02/16/10: Left CIA 9.0x28 Omnilink and REIA 8.0x40 seff expanding Zilver.   right subclavian artery stent 03/18/2008,  . S/P carotid endarterectomy      left carotid endarterectomy  1991; Stroke and TIA in 1999 with right sided weakness, now with residual right arm weakness  . Stroke     TIA  . Peripheral vascular disease   . Anxiety   . Arthritis   . Pacemaker     Past Surgical History  Procedure Laterality Date  . Appendectomy    . Abdominal hysterectomy    . Ureteral stent placement    . Carotid endarterectomy    . Laminectomy    . Back surgery    . Colonoscopy  07/30/2011    Procedure: COLONOSCOPY;  Surgeon: Inda Castle, MD;  Location: [place];  Service: Endoscopy;  Laterality: N/A;  gi bleed  . Esophagogastroduodenoscopy  07/30/2011    Procedure: ESOPHAGOGASTRODUODENOSCOPY (EGD);  Surgeon: Inda Castle, MD;  Location: [place];  Service: Endoscopy;  Laterality: N/A;  . Pacemaker insertion    . Permanent pacemaker insertion Left 06/28/2011    Procedure: PERMANENT PACEMAKER INSERTION;  Surgeon: Deboraha Sprang, MD;  Location: [facility] CATH LAB;  Service: Cardiovascular;  Laterality: Left;  . Abdominal angiogram N/A 07/06/2011    Procedure: ABDOMINAL ANGIOGRAM;  Surgeon: Laverda Page, MD;  Location: [facility] CATH LAB;  Service: Cardiovascular;  Laterality: N/A;  . Renal angiogram N/A 07/06/2011    Procedure: RENAL ANGIOGRAM;  Surgeon: Laverda Page, MD;  Location: [facility] CATH LAB;  Service: Cardiovascular;  Laterality: N/A;  . Lower extremity angiogram Right 05/29/2012    Procedure: LOWER EXTREMITY ANGIOGRAM;  Surgeon: Laverda Page, MD;  Location: [facility] CATH LAB;  Service: Cardiovascular;  Laterality: Right;   Family History  Problem Relation Age of Onset  . Cancer Father  History  Substance Use Topics  . Smoking status: Current Every Day Smoker -- 0.50 packs/day for 65 years    Types: Cigarettes  . Smokeless tobacco: Current User  . Alcohol Use: No   OB History    No data available     Review of Systems  Constitutional: Negative for fever and chills.  HENT: Negative for sore throat.   Eyes: Negative for redness.  Respiratory: Positive for shortness of breath. Negative for cough.   Cardiovascular: Negative for chest pain and palpitations.  Gastrointestinal: Negative for vomiting, abdominal pain and diarrhea.  Endocrine:  Negative for polyuria.  Genitourinary: Negative for flank pain.  Musculoskeletal: Negative for back pain and neck pain.  Skin: Negative for rash.  Neurological: Negative for headaches.  Hematological: Does not bruise/bleed easily.  Psychiatric/Behavioral: Negative for confusion.      Allergies  Peanuts; Norvasc; and Peanut-containing drug products  Home Medications   Prior to Admission medications   Medication Sig Start Date End Date Taking? Authorizing Provider  acetaminophen-codeine (TYLENOL #3) 300-30 MG per tablet Take 1 tablet by mouth every 6 (six) hours as needed for moderate pain. 12/03/13   Fransico Meadow, PA-C  ALPRAZolam Duanne Moron) 0.5 MG tablet Take 0.5 mg by mouth 3 (three) times daily as needed for anxiety.  05/30/13   Historical Provider, MD  aspirin EC 81 MG tablet Take 81 mg by mouth daily.    Historical Provider, MD  ciprofloxacin (CIPRO) 250 MG tablet Take 1 tablet (250 mg total) by mouth 2 (two) times daily. X 3days 08/09/13   Ripudeep Krystal Eaton, MD  cloNIDine (CATAPRES) 0.2 MG tablet Take 1 tablet (0.2 mg total) by mouth 3 (three) times daily. 08/09/13   Ripudeep Krystal Eaton, MD  clopidogrel (PLAVIX) 75 MG tablet Take 75 mg by mouth daily. 08/01/13   Historical Provider, MD  CRESTOR 10 MG tablet Take 10 mg by mouth daily. 08/01/13   Historical Provider, MD  ergocalciferol (VITAMIN D2) 50000 UNITS capsule Take 50,000 Units by mouth 2 (two) times a week. Tuesday and friday    Historical Provider, MD  furosemide (LASIX) 20 MG tablet Take 1 tablet (20 mg total) by mouth daily. 08/10/13   Ripudeep Krystal Eaton, MD  hydrALAZINE (APRESOLINE) 100 MG tablet Take 1 tablet (100 mg total) by mouth 4 (four) times daily. 08/09/13   Ripudeep Krystal Eaton, MD  insulin NPH-regular Human (NOVOLIN 70/30) (70-30) 100 UNIT/ML injection Inject 8 Units into the skin 2 (two) times daily with a meal. 04/08/13   Eugenie Filler, MD  isosorbide mononitrate (IMDUR) 120 MG 24 hr tablet Take 1 tablet (120 mg total) by mouth daily.  12/12/12   Annita Brod, MD  LOVAZA 1 G capsule Take 1 g by mouth 2 (two) times daily.  04/12/13   Historical Provider, MD  metoprolol tartrate (LOPRESSOR) 25 MG tablet Take 1 tablet (25 mg total) by mouth 2 (two) times daily. 08/09/13   Ripudeep Krystal Eaton, MD  mupirocin cream (BACTROBAN) 2 % Apply 1 application topically 2 (two) times daily. 12/03/13   Fransico Meadow, PA-C  potassium chloride SA (K-DUR,KLOR-CON) 10 MEQ tablet Take 1 tablet (10 mEq total) by mouth daily. 08/10/13   Ripudeep Krystal Eaton, MD  spironolactone (ALDACTONE) 50 MG tablet Take 50 mg by mouth daily. 08/01/13   Historical Provider, MD   BP 221/92 mmHg  Pulse 71  Temp(Src) 98.5 F (36.9 C) (Oral)  Resp 24  SpO2 91% Physical Exam  Constitutional: She is  oriented to person, place, and time. She appears well-developed and well-nourished. No distress.  HENT:  Mouth/Throat: Oropharynx is clear and moist.  Eyes: Conjunctivae are normal. No scleral icterus.  Neck: Neck supple. No JVD present. No tracheal deviation present.  Cardiovascular: Normal rate, regular rhythm, normal heart sounds and intact distal pulses.  Exam reveals no gallop and no friction rub.   No murmur heard. Pulmonary/Chest: Effort normal. No respiratory distress. She has wheezes.  Abdominal: Soft. Normal appearance and bowel sounds are normal. She exhibits no distension. There is no tenderness.  Genitourinary:  No cva tenderness.   Musculoskeletal: She exhibits no edema or tenderness.  Neurological: She is alert and oriented to person, place, and time.  Skin: Skin is warm and dry. No rash noted. She is not diaphoretic.  Psychiatric: She has a normal mood and affect.  Nursing note and vitals reviewed.   ED Course  Procedures (including critical care time) Labs Review  Results for orders placed or performed during the hospital encounter of 09/11/14  CBC  Result Value Ref Range   WBC 5.7 4.0 - 10.5 K/uL   RBC 4.45 3.87 - 5.11 MIL/uL   Hemoglobin 14.4 12.0 -  15.0 g/dL   HCT 42.3 36.0 - 46.0 %   MCV 95.1 78.0 - 100.0 fL   MCH 32.4 26.0 - 34.0 pg   MCHC 34.0 30.0 - 36.0 g/dL   RDW 13.8 11.5 - 15.5 %   Platelets 210 150 - 400 K/uL  Comprehensive metabolic panel  Result Value Ref Range   Sodium 139 135 - 145 mmol/L   Potassium 2.8 (L) 3.5 - 5.1 mmol/L   Chloride 106 101 - 111 mmol/L   CO2 24 22 - 32 mmol/L   Glucose, Bld 145 (H) 65 - 99 mg/dL   BUN 14 6 - 20 mg/dL   Creatinine, Ser 1.28 (H) 0.44 - 1.00 mg/dL   Calcium 7.5 (L) 8.9 - 10.3 mg/dL   Total Protein 6.7 6.5 - 8.1 g/dL   Albumin 2.7 (L) 3.5 - 5.0 g/dL   AST 22 15 - 41 U/L   ALT 22 14 - 54 U/L   Alkaline Phosphatase 96 38 - 126 U/L   Total Bilirubin 0.4 0.3 - 1.2 mg/dL   GFR calc non Af Amer 39 (L) >60 mL/min   GFR calc Af Amer 45 (L) >60 mL/min   Anion gap 9 5 - 15  Troponin I  Result Value Ref Range   Troponin I 0.04 (H) <0.031 ng/mL  Brain natriuretic peptide  Result Value Ref Range   B Natriuretic Peptide 3381.1 (H) 0.0 - 100.0 pg/mL   Dg Chest Port 1 View  09/11/2014   CLINICAL DATA:  Worsening shortness of breath for past week. Wheezing. Decreased breath sounds on left.  EXAM: PORTABLE CHEST - 1 VIEW  COMPARISON:  08/03/2013  FINDINGS: Dual lead transvenous pacemaker remains in appropriate position. Increased diffuse interstitial infiltrates and symmetric bibasilar airspace disease seen, consistent with diffuse pulmonary edema. Probable small left pleural effusion present. Heart size remains within normal limits.  IMPRESSION: Diffuse pulmonary edema and probable small left pleural effusion.   Electronically Signed   By: Earle Gell M.D.   On: 09/11/2014 15:35       EKG Interpretation   Date/Time:  Wednesday September 11 2014 15:03:40 EDT Ventricular Rate:  74 PR Interval:  167 QRS Duration: 92 QT Interval:  431 QTC Calculation: 478 R Axis:   24 Text Interpretation:  Sinus rhythm  LVH with secondary repolarization  abnormality Nonspecific T wave abnormality No  significant change since  last tracing Confirmed by [facility]  MD, Lennette Bihari ([id number]) on 09/11/2014 3:06:11 PM      MDM   Iv ns. Continuous pulse ox and monitor. o2 [place]. bp recheck - remains high.  Hx hypertensive urgency, chf.  ntg sl for bp improvement/afterload reduction.  Pt indicates hasnt had bp meds yet today, will give dose.    Lasix iv.  Awaiting cxr and lab results.  ntg paste.   Reviewed nursing notes and prior charts for additional history.   Recheck bp improved. cxr w pulm edema.  k low, mg added to labs, kcl po and iv.   resp status stable/sl improved from prior.  CRITICAL CARE  RE hypertensive urgency resulting in pulmonary edema/chf, hypokalemia, dyspnea Performed by: Mirna Mires Total critical care time: 35 Critical care time was exclusive of separately billable procedures and treating other patients. Critical care was necessary to treat or prevent imminent or life-threatening deterioration. Critical care was time spent personally by me on the following activities: development of treatment plan with patient and/or surrogate as well as nursing, discussions with consultants, evaluation of patient's response to treatment, examination of patient, obtaining history from patient or surrogate, ordering and performing treatments and interventions, ordering and review of laboratory studies, ordering and review of radiographic studies, pulse oximetry and re-evaluation of patient's condition.  Med service contacted for admission.     Lajean Saver, MD 09/11/14 [phone]

## 2014-09-11 NOTE — H&P (Addendum)
[facility] History and Physical  [name]:[id number] DOB: 02-Nov-1933 DOA: 09/11/2014  Referring physician: Dr. Ashok Cordia PCP: Maximino Greenland, MD   Chief Complaint: SOB  HPI: Carmen [name] is a 81 y.o. female with past apical history of chronic diastolic heart failure that comes in for shortness of breath that started 4 days prior to admission. He relates she was noticing with ambulation-cannot lay flat to sleep. So she called EMS on the day of admission when they got to her house she was wheezing and given albuterol and Solu-Medrol. She relates orthopnea no chest pain. She denies any nausea vomiting or diarrhea. No fevers or chills or cough.  In the ED: She was found to have a blood pressure greater than 200/100, She was given IV nitroglycerin and IV Lasix her blood pressure came 190/90. So we were consulted for admission and further evaluation.  Review of Systems:  Constitutional:  No weight loss, night sweats, Fevers, chills, fatigue.  HEENT:  No headaches, Difficulty swallowing,Tooth/dental problems,Sore throat,  No sneezing, itching, ear ache, nasal congestion, post nasal drip,  Cardio-vascular:  No chest pain, Orthopnea, PND, swelling in lower extremities, anasarca, dizziness, palpitations  GI:  No heartburn, indigestion, abdominal pain, nausea, vomiting, diarrhea, change in bowel habits, loss of appetite  Resp:   No excess mucus, no productive cough, No non-productive cough, No coughing up of blood.No change in color of mucus.No wheezing.No chest wall deformity  Skin:  no rash or lesions.  GU:  no dysuria, change in color of urine, no urgency or frequency. No flank pain.  Musculoskeletal:  No joint pain or swelling. No decreased range of motion. No back pain.  Psych:  No change in mood or affect. No depression or anxiety. No memory loss.   Past Medical History  Diagnosis Date  . Hypertension   . Coronary artery disease   . Renal disorder   . Herniated  lumbar intervertebral disc   . Cataract     "both eyes; blood pressure's always too high to have them fixed" (09/11/2014)  . Renovascular hypertension      s/p left renal artery stent 12/2007.  S/P balloon angioplasty on 02/16/10 for ISR, BP was  controlled well since then.     . Claudication in peripheral vascular disease     02/16/10: Left CIA 9.0x28 Omnilink and REIA 8.0x40 seff expanding Zilver.   right subclavian artery stent 03/18/2008,  . Peripheral vascular disease   . Anxiety   . Pacemaker   . Pneumonia X 2  . IDDM (insulin dependent diabetes mellitus)   . Migraine     "used to have terrible migraines; stopped in the 1990's"  . Stroke 1999    Stroke and TIA in 1999 with right sided weakness, now with residual right arm weakness (09/11/2014)  . Arthritis     "everywhere"  . Chronic lower back pain    Past Surgical History  Procedure Laterality Date  . Appendectomy    . Ureteral stent placement    . Carotid endarterectomy Left 1991     Stroke and TIA in 1999 with right sided weakness, now with residual right arm weakness  . Lumbar laminectomy      'herniated disc"  . Back surgery    . Colonoscopy  07/30/2011    Procedure: COLONOSCOPY;  Surgeon: Inda Castle, MD;  Location: [place];  Service: Endoscopy;  Laterality: N/A;  gi bleed  . Esophagogastroduodenoscopy  07/30/2011    Procedure: ESOPHAGOGASTRODUODENOSCOPY (EGD);  Surgeon:  Inda Castle, MD;  Location: [place];  Service: Endoscopy;  Laterality: N/A;  . Permanent pacemaker insertion Left 06/28/2011    Procedure: PERMANENT PACEMAKER INSERTION;  Surgeon: Deboraha Sprang, MD;  Location: [facility] CATH LAB;  Service: Cardiovascular;  Laterality: Left;  . Abdominal angiogram N/A 07/06/2011    Procedure: ABDOMINAL ANGIOGRAM;  Surgeon: Laverda Page, MD;  Location: [facility] CATH LAB;  Service: Cardiovascular;  Laterality: N/A;  . Renal angiogram N/A 07/06/2011    Procedure: RENAL ANGIOGRAM;  Surgeon: Laverda Page, MD;   Location: [facility] CATH LAB;  Service: Cardiovascular;  Laterality: N/A;  . Lower extremity angiogram Right 05/29/2012    Procedure: LOWER EXTREMITY ANGIOGRAM;  Surgeon: Laverda Page, MD;  Location: [facility] CATH LAB;  Service: Cardiovascular;  Laterality: Right;  . Tonsillectomy    . Insert / replace / remove pacemaker    . Excisional hemorrhoidectomy    . Carpal tunnel release Left   . Vaginal hysterectomy     Social History:  reports that she has been smoking Cigarettes.  She has been smoking about 0.00 packs per day for the past 70 years. She has never used smokeless tobacco. She reports that she drinks alcohol. She reports that she does not use illicit drugs.  Allergies  Allergen Reactions  . Peanuts [Peanut Oil] Swelling  . Norvasc [Amlodipine Besylate] Swelling  . Peanut-Containing Drug Products Other (See Comments)    Cause my stomach to hurt    Family History  Problem Relation Age of Onset  . Cancer Father   . Heart attack Mother     Prior to Admission medications   Medication Sig Start Date End Date Taking? Authorizing Provider  acetaminophen-codeine (TYLENOL #3) 300-30 MG per tablet Take 1 tablet by mouth every 6 (six) hours as needed for moderate pain. 12/03/13  Yes Hollace Kinnier Sofia, PA-C  aspirin EC 81 MG tablet Take 81 mg by mouth daily.   Yes Historical Provider, MD  furosemide (LASIX) 20 MG tablet Take 1 tablet (20 mg total) by mouth daily. 08/10/13  Yes Ripudeep Krystal Eaton, MD  insulin NPH-regular Human (NOVOLIN 70/30) (70-30) 100 UNIT/ML injection Inject 8 Units into the skin 2 (two) times daily with a meal. 04/08/13  Yes Eugenie Filler, MD  metoprolol tartrate (LOPRESSOR) 25 MG tablet Take 1 tablet (25 mg total) by mouth 2 (two) times daily. 08/09/13  Yes Ripudeep Krystal Eaton, MD  Omega-3 Fatty Acids (FISH OIL PEARLS) 300 MG CAPS Take 1 tablet by mouth 2 (two) times daily.   Yes Historical Provider, MD  ALPRAZolam Duanne Moron) 0.5 MG tablet Take 0.5 mg by mouth 3 (three) times daily as needed  for anxiety.  05/30/13   Historical Provider, MD  ciprofloxacin (CIPRO) 250 MG tablet Take 1 tablet (250 mg total) by mouth 2 (two) times daily. X 3days Patient not taking: Reported on 09/11/2014 08/09/13   Ripudeep Krystal Eaton, MD  cloNIDine (CATAPRES) 0.2 MG tablet Take 1 tablet (0.2 mg total) by mouth 3 (three) times daily. Patient not taking: Reported on 09/11/2014 08/09/13   Ripudeep Krystal Eaton, MD  hydrALAZINE (APRESOLINE) 100 MG tablet Take 1 tablet (100 mg total) by mouth 4 (four) times daily. Patient not taking: Reported on 09/11/2014 08/09/13   Ripudeep Krystal Eaton, MD  isosorbide mononitrate (IMDUR) 120 MG 24 hr tablet Take 1 tablet (120 mg total) by mouth daily. Patient not taking: Reported on 09/11/2014 12/12/12   Annita Brod, MD  mupirocin cream (BACTROBAN) 2 % Apply  1 application topically 2 (two) times daily. Patient not taking: Reported on 09/11/2014 12/03/13   Fransico Meadow, PA-C  potassium chloride SA (K-DUR,KLOR-CON) 10 MEQ tablet Take 1 tablet (10 mEq total) by mouth daily. Patient not taking: Reported on 09/11/2014 08/10/13   Ripudeep Krystal Eaton, MD   Physical Exam: Filed Vitals:   09/15/14 2104 09/16/14 0521 09/16/14 1000 09/16/14 1330  BP: 126/59 167/70 145/61 136/48  Pulse: 62 62 81 62  Temp: 97.5 F (36.4 C) 97.7 F (36.5 C)  97.9 F (36.6 C)  TempSrc: Oral Oral  Oral  Resp:    16  Height:      Weight:  48.081 kg (106 lb)    SpO2: 98% 100%  98%    Wt Readings from Last 3 Encounters:  09/16/14 48.081 kg (106 lb)  08/07/13 50.3 kg (110 lb 14.3 oz)  06/03/13 55.384 kg (122 lb 1.6 oz)    General:  Appears calm and comfortable Eyes: PERRL, normal lids, irises & conjunctiva ENT: grossly normal hearing, lips & tongue, +JVD Neck: no LAD, masses or thyromegaly Cardiovascular: RRR, no m/r/g. No LE edema. Telemetry: SR, no arrhythmias  Respiratory: Good air movement and clear to auscultation. Abdomen: soft, ntnd Skin: no rash or induration seen on limited exam Musculoskeletal: grossly normal  tone BUE/BLE Psychiatric: grossly normal mood and affect, speech fluent and appropriate Neurologic: grossly non-focal.          Labs on Admission:  Basic Metabolic Panel:  Recent Labs Lab 09/11/14 1522 09/11/14 2200 09/12/14 0316 09/13/14 0234 09/14/14 0250 09/15/14 0310 09/16/14 0337  NA 139  --  137 141 137 134* 136  K 2.8*  --  3.0* 2.8* 3.7 3.6 3.5  CL 106  --  104 109 104 99* 101  CO2 24  --  22 24 25 24 26   GLUCOSE 145*  --  254* 68 187* 126* 69  BUN 14  --  20 20 20  25* 33*  CREATININE 1.28*  --  1.34* 1.39* 1.36* 1.26* 1.51*  CALCIUM 7.5*  --  7.1* 7.6* 7.9* 8.3* 7.8*  MG 1.7 1.5*  --  1.8  --   --   --    Liver Function Tests:  Recent Labs Lab 09/11/14 1522  AST 22  ALT 22  ALKPHOS 96  BILITOT 0.4  PROT 6.7  ALBUMIN 2.7*   No results for input(s): LIPASE, AMYLASE in the last 168 hours. No results for input(s): AMMONIA in the last 168 hours. CBC:  Recent Labs Lab 09/11/14 1522  WBC 5.7  HGB 14.4  HCT 42.3  MCV 95.1  PLT 210   Cardiac Enzymes:  Recent Labs Lab 09/11/14 1522  TROPONINI 0.04*    BNP (last 3 results)  Recent Labs  09/11/14 1522  BNP 3381.1*    ProBNP (last 3 results) No results for input(s): PROBNP in the last 8760 hours.  CBG:  Recent Labs Lab 09/15/14 1346 09/15/14 2101 09/16/14 0630 09/16/14 0654 09/16/14 1106  GLUCAP 344* 288* 51* 121* 162*    Radiological Exams on Admission: No results found.  EKG: Independently reviewed. Sinus rhythm no ST segment abnormalities LVH pattern.  Assessment/Plan Acute respiratory failure with hypoxia due to   Pulmonary edema Acute diastolic heart failure - She she has not been compliant with her diet, she said she did not take her medications this morning, she has positive JVD minimal lower extremity edema, chest x-ray that showed pulmonary edema with an elevated BNP with orthopnea and  hypoxic. - She is in acute decompensated heart failure with secondary pulmonary  edema. - I agree with nitroglycerin and IV Lasix, will resume her Imdur and hydralazine. She is not on an Ace. Her GFR is greater than 30 so start low-dose ACE inhibitor. Continue metoprolol strict I's and O's low-sodium diet and daily weights. - She can probably go to telemetry. Her troponin is just barely elevated I think is due to acute decompensated heart failure.  Hypertensive urgency: - Sure which one came first, but she relates her respiration is better and her blood pressure continues to be elevated. - I will resume her home medications, Imdur hydralazine and metoprolol. - Change her Lasix to IV and start low-dose lisinopril.  Hypokalemia: - We'll replete her potassium IV and orally. Will check a magnesium level. DM (diabetes mellitus), type 2, uncontrolled, periph vascular complic  CKD (chronic kidney disease), stage III: - Her creatinine seems at baseline and recheck in the morning.  Protein-calorie malnutrition, severe Ensure 3 times a day.  Code Status: full DVT Prophylaxis:lovenox Family Communication: none Disposition Plan: inpatient for 4-5 days  Time spent: 62 min  Charlynne Cousins [facility] Pager [phone]

## 2014-09-12 LAB — BASIC METABOLIC PANEL
ANION GAP: 11 (ref 5–15)
BUN: 20 mg/dL (ref 6–20)
CHLORIDE: 104 mmol/L (ref 101–111)
CO2: 22 mmol/L (ref 22–32)
Calcium: 7.1 mg/dL — ABNORMAL LOW (ref 8.9–10.3)
Creatinine, Ser: 1.34 mg/dL — ABNORMAL HIGH (ref 0.44–1.00)
GFR, EST AFRICAN AMERICAN: 43 mL/min — AB (ref >60)
GFR, EST NON AFRICAN AMERICAN: 37 mL/min — AB (ref >60)
Glucose, Bld: 254 mg/dL — ABNORMAL HIGH (ref 65–99)
Potassium: 3 mmol/L — ABNORMAL LOW (ref 3.5–5.1)
SODIUM: 137 mmol/L (ref 135–145)

## 2014-09-12 LAB — GLUCOSE, CAPILLARY
GLUCOSE-CAPILLARY: 165 mg/dL — AB (ref 65–99)
Glucose-Capillary: 178 mg/dL — ABNORMAL HIGH (ref 65–99)
Glucose-Capillary: 188 mg/dL — ABNORMAL HIGH (ref 65–99)
Glucose-Capillary: 190 mg/dL — ABNORMAL HIGH (ref 65–99)

## 2014-09-12 MED ORDER — LISINOPRIL 10 MG PO TABS
10.0000 mg | ORAL_TABLET | Freq: Every day | ORAL | Status: DC
  Administered 2014-09-12 – 2014-09-13 (×2): 10 mg via ORAL
  Filled 2014-09-12 (×3): qty 1

## 2014-09-12 MED ORDER — ISOSORB DINITRATE-HYDRALAZINE 20-37.5 MG PO TABS
2.0000 | ORAL_TABLET | Freq: Two times a day (BID) | ORAL | Status: DC
  Administered 2014-09-12 – 2014-09-14 (×6): 2 via ORAL
  Filled 2014-09-12 (×8): qty 2

## 2014-09-12 MED ORDER — FUROSEMIDE 10 MG/ML IJ SOLN
60.0000 mg | Freq: Two times a day (BID) | INTRAMUSCULAR | Status: DC
  Administered 2014-09-12 – 2014-09-13 (×2): 60 mg via INTRAVENOUS
  Filled 2014-09-12 (×3): qty 6

## 2014-09-12 MED ORDER — POTASSIUM CHLORIDE CRYS ER 20 MEQ PO TBCR: 40.0000 meq | EXTENDED_RELEASE_TABLET | Freq: Two times a day (BID) | ORAL | Status: DC

## 2014-09-12 MED ORDER — MAGNESIUM SULFATE 2 GM/50ML IV SOLN
2.0000 g | Freq: Once | INTRAVENOUS | Status: AC
  Administered 2014-09-12: 2 g via INTRAVENOUS
  Filled 2014-09-12: qty 50

## 2014-09-12 NOTE — Care Management Note (Signed)
Case Management Note  Patient Details  Name: Carmen [name] MRN: [id number] Date of Birth: 07-Dec-1933  Subjective/Objective:      Pt admitted with CHF              Action/Plan: PTA pt lived at home- NCM to follow for d/c needs  Expected Discharge Date:                  Expected Discharge Plan:  Real  In-House Referral:     Discharge planning Services  CM Consult  Post Acute Care Choice:    Choice offered to:     DME Arranged:    DME Agency:     HH Arranged:    HH Agency:     Status of Service:  In process, will continue to follow  Medicare Important Message Given:    Date Medicare IM Given:    Medicare IM give by:    Date Additional Medicare IM Given:    Additional Medicare Important Message give by:     If discussed at [place] of Stay Meetings, dates discussed:    Additional Comments:  Dawayne Patricia, RN 09/12/2014, 10:04 AM

## 2014-09-12 NOTE — Progress Notes (Signed)
[facility] PROGRESS NOTE  Filed Weights   09/11/14 1535 09/11/14 1936 09/12/14 0330  Weight: 49.896 kg (110 lb) 54.522 kg (120 lb 3.2 oz) 54.522 kg (120 lb 3.2 oz)        Intake/Output Summary (Last 24 hours) at 09/12/14 0830 Last data filed at 09/12/14 0100  Gross per 24 hour  Intake    240 ml  Output    550 ml  Net   -310 ml     Assessment/Plan: Acute respiratory failure with hypoxia/  Pulmonary edema/ Acute diastolic heart failure - Unknown estimated dry weight, she is still fluid overloaded. - Sluggish diuresis, will go ahead and increase her Lasix, she is on hydralazine 4 times a day which is unlikely she's taking religiously. - We'll change her to BiDil twice a day, increase her lisinopril. - Low-sodium diet strict I's and O's and fluid restriction.  Protein-calorie malnutrition, severe  Renal failure (ARF), acute on chronic - Creatinine has hold steady we'll continue to monitor.  DM (diabetes mellitus), type 2, uncontrolled, periph vascular complic - Blood glucoses improve with Levemir insulin sliding scale. Will continue CBGs before meals and at bedtime.   Hypokalemia: - Continue oral repletion. - Magnesium was 1.5 recorded repleted and reevaluate in the morning.     Code Status: full Family Communication: daughter  Disposition Plan: Home 2-3 days.   Consultants:  none  Procedures: ECHO: none  Antibiotics:  None  HPI/Subjective: She relates her breathing is much better she gets to not lay flat to sleep.  Objective: Filed Vitals:   09/11/14 1900 09/11/14 1936 09/11/14 2340 09/12/14 0330  BP: 161/65 173/81 179/70 182/66  Pulse: 60 68 60 60  Temp:  98.2 F (36.8 C) 97.6 F (36.4 C) 97.7 F (36.5 C)  TempSrc:  Oral Oral Oral  Resp: 17 16 18 20   Height:      Weight:  54.522 kg (120 lb 3.2 oz)  54.522 kg (120 lb 3.2 oz)  SpO2: 98% 98% 99% 100%     Exam:  General: Alert, awake, oriented x3, in no acute distress.  HEENT: No  bruits, no goiter. Positive JVD Heart: Regular rate and rhythm,  Lungs: Good air movement, crackles at bases bilaterally. No lower extremity edema. Abdomen: Soft, nontender, nondistended, positive bowel sounds.  Neuro: Grossly intact, nonfocal.   Data Reviewed: Basic Metabolic Panel:  Recent Labs Lab 09/11/14 1522 09/11/14 2200 09/12/14 0316  NA 139  --  137  K 2.8*  --  3.0*  CL 106  --  104  CO2 24  --  22  GLUCOSE 145*  --  254*  BUN 14  --  20  CREATININE 1.28*  --  1.34*  CALCIUM 7.5*  --  7.1*  MG 1.7 1.5*  --    Liver Function Tests:  Recent Labs Lab 09/11/14 1522  AST 22  ALT 22  ALKPHOS 96  BILITOT 0.4  PROT 6.7  ALBUMIN 2.7*   No results for input(s): LIPASE, AMYLASE in the last 168 hours. No results for input(s): AMMONIA in the last 168 hours. CBC:  Recent Labs Lab 09/11/14 1522  WBC 5.7  HGB 14.4  HCT 42.3  MCV 95.1  PLT 210   Cardiac Enzymes:  Recent Labs Lab 09/11/14 1522  TROPONINI 0.04*   BNP (last 3 results)  Recent Labs  09/11/14 1522  BNP 3381.1*    ProBNP (last 3 results) No results for input(s): PROBNP in the last 8760 hours.  CBG:  Recent Labs Lab 09/11/14 2129 09/12/14 0603  GLUCAP 309* 178*    No results found for this or any previous visit (from the past 240 hour(s)).   Studies: Dg Chest Port 1 View  09/11/2014   CLINICAL DATA:  Worsening shortness of breath for past week. Wheezing. Decreased breath sounds on left.  EXAM: PORTABLE CHEST - 1 VIEW  COMPARISON:  08/03/2013  FINDINGS: Dual lead transvenous pacemaker remains in appropriate position. Increased diffuse interstitial infiltrates and symmetric bibasilar airspace disease seen, consistent with diffuse pulmonary edema. Probable small left pleural effusion present. Heart size remains within normal limits.  IMPRESSION: Diffuse pulmonary edema and probable small left pleural effusion.   Electronically Signed   By: Earle Gell M.D.   On: 09/11/2014 15:35     Scheduled Meds: . aspirin EC  81 mg Oral Daily  . enoxaparin (LOVENOX) injection  40 mg Subcutaneous Q24H  . furosemide  40 mg Intravenous Q12H  . hydrALAZINE  100 mg Oral QID  . insulin aspart  0-5 Units Subcutaneous QHS  . insulin aspart  0-9 Units Subcutaneous TID WC  . insulin aspart  3 Units Subcutaneous TID WC  . insulin detemir  10 Units Subcutaneous QHS  . isosorbide mononitrate  120 mg Oral Daily  . lisinopril  5 mg Oral Daily  . metoprolol tartrate  25 mg Oral BID  . potassium chloride SA  40 mEq Oral BID  . sodium chloride  3 mL Intravenous Q12H   Continuous Infusions:    Charlynne Cousins  [facility] Pager [phone] If 7PM-7AM, please contact night-coverage at [email or web address], password [facility] 09/12/2014, 8:30 AM  LOS: 1 day

## 2014-09-12 NOTE — Progress Notes (Signed)
Utilization review completed.  

## 2014-09-13 LAB — BASIC METABOLIC PANEL
ANION GAP: 8 (ref 5–15)
BUN: 20 mg/dL (ref 6–20)
CALCIUM: 7.6 mg/dL — AB (ref 8.9–10.3)
CO2: 24 mmol/L (ref 22–32)
Chloride: 109 mmol/L (ref 101–111)
Creatinine, Ser: 1.39 mg/dL — ABNORMAL HIGH (ref 0.44–1.00)
GFR calc Af Amer: 41 mL/min — ABNORMAL LOW (ref >60)
GFR, EST NON AFRICAN AMERICAN: 35 mL/min — AB (ref >60)
Glucose, Bld: 68 mg/dL (ref 65–99)
Potassium: 2.8 mmol/L — ABNORMAL LOW (ref 3.5–5.1)
Sodium: 141 mmol/L (ref 135–145)

## 2014-09-13 LAB — HEMOGLOBIN A1C
Hgb A1c MFr Bld: 8.7 % — ABNORMAL HIGH (ref 4.8–5.6)
Mean Plasma Glucose: 203 mg/dL

## 2014-09-13 LAB — GLUCOSE, CAPILLARY
GLUCOSE-CAPILLARY: 68 mg/dL (ref 65–99)
Glucose-Capillary: 42 mg/dL — CL (ref 65–99)
Glucose-Capillary: 315 mg/dL — ABNORMAL HIGH (ref 65–99)
Glucose-Capillary: 118 mg/dL — ABNORMAL HIGH (ref 65–99)
Glucose-Capillary: 372 mg/dL — ABNORMAL HIGH (ref 65–99)

## 2014-09-13 LAB — MAGNESIUM: MAGNESIUM: 1.8 mg/dL (ref 1.7–2.4)

## 2014-09-13 MED ORDER — METOLAZONE 2.5 MG PO TABS
2.5000 mg | ORAL_TABLET | Freq: Once | ORAL | Status: AC
  Administered 2014-09-13: 2.5 mg via ORAL
  Filled 2014-09-13: qty 1

## 2014-09-13 MED ORDER — POTASSIUM CHLORIDE CRYS ER 20 MEQ PO TBCR
40.0000 meq | EXTENDED_RELEASE_TABLET | Freq: Three times a day (TID) | ORAL | Status: AC
  Administered 2014-09-13 (×2): 40 meq via ORAL
  Filled 2014-09-13: qty 2

## 2014-09-13 MED ORDER — POTASSIUM CHLORIDE 10 MEQ/100ML IV SOLN
10.0000 meq | INTRAVENOUS | Status: AC
  Administered 2014-09-13 (×4): 10 meq via INTRAVENOUS
  Filled 2014-09-13 (×5): qty 100

## 2014-09-13 MED ORDER — POTASSIUM CHLORIDE CRYS ER 20 MEQ PO TBCR: 40.0000 meq | EXTENDED_RELEASE_TABLET | Freq: Two times a day (BID) | ORAL | Status: DC

## 2014-09-13 MED ORDER — ENSURE ENLIVE PO LIQD
237.0000 mL | Freq: Two times a day (BID) | ORAL | Status: DC
  Administered 2014-09-13 – 2014-09-16 (×6): 237 mL via ORAL

## 2014-09-13 MED ORDER — FUROSEMIDE 10 MG/ML IJ SOLN
80.0000 mg | Freq: Two times a day (BID) | INTRAMUSCULAR | Status: DC
  Administered 2014-09-13 – 2014-09-15 (×4): 80 mg via INTRAVENOUS
  Filled 2014-09-13 (×4): qty 8

## 2014-09-13 MED ORDER — INSULIN DETEMIR 100 UNIT/ML ~~LOC~~ SOLN
5.0000 [IU] | Freq: Every day | SUBCUTANEOUS | Status: DC
  Administered 2014-09-13: 5 [IU] via SUBCUTANEOUS
  Filled 2014-09-13 (×3): qty 0.05

## 2014-09-13 MED ORDER — POTASSIUM CHLORIDE 10 MEQ/100ML IV SOLN
10.0000 meq | INTRAVENOUS | Status: AC
  Administered 2014-09-13: 10 meq via INTRAVENOUS
  Filled 2014-09-13: qty 100

## 2014-09-13 NOTE — Progress Notes (Signed)
[facility] PROGRESS NOTE  Filed Weights   09/11/14 1936 09/12/14 0330 09/13/14 0544  Weight: 54.522 kg (120 lb 3.2 oz) 54.522 kg (120 lb 3.2 oz) 53.797 kg (118 lb 9.6 oz)        Intake/Output Summary (Last 24 hours) at 09/13/14 0930 Last data filed at 09/13/14 0500  Gross per 24 hour  Intake    243 ml  Output   1550 ml  Net  -1307 ml     Assessment/Plan: Acute respiratory failure with hypoxia/  Pulmonary edema/ Acute diastolic heart failure - Unknown estimated dry weight, she is still fluid overloaded. - Good diuresis with IV lasix. Still fluid overloaded add low dose metolazone. - We'll change her to BiDil twice a day, increase her lisinopril. - Low-sodium diet strict I's and O's and fluid restriction.  Protein-calorie malnutrition, severe Ensure   Renal failure (ARF), acute on chronic - At baseline  DM (diabetes mellitus), type 2, uncontrolled, periph vascular complic - decrease Levemir insulin sliding scale. Will continue CBGs before meals and at bedtime.   Hypokalemia: - Continue oral repletion and IV repletion.    Code Status: full Family Communication: daughter  Disposition Plan: Home 2-3 days.   Consultants:  none  Procedures: ECHO: none  Antibiotics:  None  HPI/Subjective: She relates her breathing is much better she gets to not lay flat to sleep.  Objective: Filed Vitals:   09/12/14 0835 09/12/14 1349 09/12/14 2017 09/13/14 0544  BP: 223/110 130/55 157/52 176/81  Pulse: 81 59 60 68  Temp: 98.1 F (36.7 C) 97.9 F (36.6 C) 98.5 F (36.9 C) 98.2 F (36.8 C)  TempSrc: Oral Oral Oral Oral  Resp: 18 18 18 18   Height:      Weight:    53.797 kg (118 lb 9.6 oz)  SpO2: 98% 98% 98% 94%     Exam:  General: Alert, awake, oriented x3, in no acute distress.  HEENT: No bruits, no goiter. Positive JVD Heart: Regular rate and rhythm,  Lungs: Good air movement,  No lower extremity edema. Abdomen: Soft, nontender, nondistended,  positive bowel sounds.  Neuro: Grossly intact, nonfocal.   Data Reviewed: Basic Metabolic Panel:  Recent Labs Lab 09/11/14 1522 09/11/14 2200 09/12/14 0316 09/13/14 0234  NA 139  --  137 141  K 2.8*  --  3.0* 2.8*  CL 106  --  104 109  CO2 24  --  22 24  GLUCOSE 145*  --  254* 68  BUN 14  --  20 20  CREATININE 1.28*  --  1.34* 1.39*  CALCIUM 7.5*  --  7.1* 7.6*  MG 1.7 1.5*  --  1.8   Liver Function Tests:  Recent Labs Lab 09/11/14 1522  AST 22  ALT 22  ALKPHOS 96  BILITOT 0.4  PROT 6.7  ALBUMIN 2.7*   No results for input(s): LIPASE, AMYLASE in the last 168 hours. No results for input(s): AMMONIA in the last 168 hours. CBC:  Recent Labs Lab 09/11/14 1522  WBC 5.7  HGB 14.4  HCT 42.3  MCV 95.1  PLT 210   Cardiac Enzymes:  Recent Labs Lab 09/11/14 1522  TROPONINI 0.04*   BNP (last 3 results)  Recent Labs  09/11/14 1522  BNP 3381.1*    ProBNP (last 3 results) No results for input(s): PROBNP in the last 8760 hours.  CBG:  Recent Labs Lab 09/12/14 1112 09/12/14 1718 09/12/14 2054 09/13/14 0630 09/13/14 0702  GLUCAP 165* 188* 190* 42* 68  No results found for this or any previous visit (from the past 240 hour(s)).   Studies: Dg Chest Port 1 View  09/11/2014   CLINICAL DATA:  Worsening shortness of breath for past week. Wheezing. Decreased breath sounds on left.  EXAM: PORTABLE CHEST - 1 VIEW  COMPARISON:  08/03/2013  FINDINGS: Dual lead transvenous pacemaker remains in appropriate position. Increased diffuse interstitial infiltrates and symmetric bibasilar airspace disease seen, consistent with diffuse pulmonary edema. Probable small left pleural effusion present. Heart size remains within normal limits.  IMPRESSION: Diffuse pulmonary edema and probable small left pleural effusion.   Electronically Signed   By: Earle Gell M.D.   On: 09/11/2014 15:35    Scheduled Meds: . aspirin EC  81 mg Oral Daily  . enoxaparin (LOVENOX) injection   40 mg Subcutaneous Q24H  . furosemide  60 mg Intravenous Q12H  . insulin aspart  0-5 Units Subcutaneous QHS  . insulin aspart  0-9 Units Subcutaneous TID WC  . insulin aspart  3 Units Subcutaneous TID WC  . insulin detemir  10 Units Subcutaneous QHS  . isosorbide-hydrALAZINE  2 tablet Oral BID  . lisinopril  10 mg Oral Daily  . metoprolol tartrate  25 mg Oral BID  . potassium chloride SA  40 mEq Oral BID  . sodium chloride  3 mL Intravenous Q12H   Continuous Infusions:    Charlynne Cousins  [facility] Pager [phone] If 7PM-7AM, please contact night-coverage at [email or web address], password [facility] 09/13/2014, 9:30 AM  LOS: 2 days

## 2014-09-13 NOTE — Care Management (Signed)
Important Message  Patient Details  Name: Carmen [name] MRN: [id number] Date of Birth: [birth year]   Medicare Important Message Given:  Yes-second notification given    Dawayne Patricia, RN 09/13/2014, 10:59 AM

## 2014-09-13 NOTE — Progress Notes (Signed)
Hypoglycemic Event  CBG: 42  Treatment: Salli Real, Graham Crackers  Symptoms: None  Follow-up CBG: Time: 7:05 CBG Result: 68  Possible Reasons for Event: Unknown   Comments/MD notified:    Raliegh Ip L  Remember to initiate Hypoglycemia Order Set & complete

## 2014-09-14 LAB — BASIC METABOLIC PANEL
ANION GAP: 8 (ref 5–15)
BUN: 20 mg/dL (ref 6–20)
CALCIUM: 7.9 mg/dL — AB (ref 8.9–10.3)
CHLORIDE: 104 mmol/L (ref 101–111)
CO2: 25 mmol/L (ref 22–32)
Creatinine, Ser: 1.36 mg/dL — ABNORMAL HIGH (ref 0.44–1.00)
GFR calc non Af Amer: 36 mL/min — ABNORMAL LOW (ref >60)
GFR, EST AFRICAN AMERICAN: 42 mL/min — AB (ref >60)
GLUCOSE: 187 mg/dL — AB (ref 65–99)
Potassium: 3.7 mmol/L (ref 3.5–5.1)
SODIUM: 137 mmol/L (ref 135–145)

## 2014-09-14 LAB — GLUCOSE, CAPILLARY
GLUCOSE-CAPILLARY: 109 mg/dL — AB (ref 65–99)
GLUCOSE-CAPILLARY: 243 mg/dL — AB (ref 65–99)
Glucose-Capillary: 183 mg/dL — ABNORMAL HIGH (ref 65–99)
Glucose-Capillary: 127 mg/dL — ABNORMAL HIGH (ref 65–99)

## 2014-09-14 MED ORDER — LISINOPRIL 20 MG PO TABS
20.0000 mg | ORAL_TABLET | Freq: Every day | ORAL | Status: DC
  Administered 2014-09-14 – 2014-09-15 (×2): 20 mg via ORAL
  Filled 2014-09-14 (×2): qty 1

## 2014-09-14 MED ORDER — INSULIN DETEMIR 100 UNIT/ML ~~LOC~~ SOLN
7.0000 [IU] | Freq: Every day | SUBCUTANEOUS | Status: DC
  Administered 2014-09-14 – 2014-09-15 (×2): 7 [IU] via SUBCUTANEOUS
  Filled 2014-09-14 (×3): qty 0.07

## 2014-09-14 MED ORDER — ENOXAPARIN SODIUM 30 MG/0.3ML ~~LOC~~ SOLN
30.0000 mg | SUBCUTANEOUS | Status: DC
  Administered 2014-09-14 – 2014-09-15 (×2): 30 mg via SUBCUTANEOUS
  Filled 2014-09-14 (×3): qty 0.3

## 2014-09-14 MED ORDER — POTASSIUM CHLORIDE CRYS ER 20 MEQ PO TBCR
40.0000 meq | EXTENDED_RELEASE_TABLET | Freq: Two times a day (BID) | ORAL | Status: AC
  Administered 2014-09-14 (×2): 40 meq via ORAL
  Filled 2014-09-14: qty 2

## 2014-09-14 MED ORDER — METOLAZONE 2.5 MG PO TABS
2.5000 mg | ORAL_TABLET | Freq: Once | ORAL | Status: AC
  Administered 2014-09-14: 2.5 mg via ORAL
  Filled 2014-09-14: qty 1

## 2014-09-14 NOTE — Progress Notes (Signed)
[facility] PROGRESS NOTE  Filed Weights   09/12/14 0330 09/13/14 0544 09/14/14 0458  Weight: 54.522 kg (120 lb 3.2 oz) 53.797 kg (118 lb 9.6 oz) 53.842 kg (118 lb 11.2 oz)        Intake/Output Summary (Last 24 hours) at 09/14/14 4193 Last data filed at 09/14/14 7902  Gross per 24 hour  Intake    820 ml  Output   2750 ml  Net  -1930 ml     Assessment/Plan: Acute respiratory failure with hypoxia/  Pulmonary edema/ Acute diastolic heart failure - Unknown estimated dry weight, she is still fluid overloaded. - Cont IV lasix and metolazone.  - We'll change her to BiDil twice a day, increase her lisinopril. - Low-sodium diet strict I's and O's and fluid restriction. Cr cont to be stable.  Protein-calorie malnutrition, severe Ensure   Renal failure (ARF), acute on chronic - At baseline  DM (diabetes mellitus), type 2, uncontrolled, periph vascular complic - decrease Levemir insulin sliding scale. Will continue CBGs before meals and at bedtime.   Hypokalemia: - resolved - Continue supplementation while diuresing.    Code Status: full Family Communication: daughter  Disposition Plan: Home 2-3 days.   Consultants:  none  Procedures: ECHO: none  Antibiotics:  None  HPI/Subjective: She relates her breathing is much better she gets to not lay flat to sleep.  Objective: Filed Vitals:   09/13/14 0544 09/13/14 1402 09/13/14 2106 09/14/14 0458  BP: 176/81 158/50 194/72 169/64  Pulse: 68 71 74 67  Temp: 98.2 F (36.8 C) 98.3 F (36.8 C) 97.7 F (36.5 C) 98.6 F (37 C)  TempSrc: Oral Oral Oral Oral  Resp: 18 18 18 18   Height:      Weight: 53.797 kg (118 lb 9.6 oz)   53.842 kg (118 lb 11.2 oz)  SpO2: 94% 96% 99% 99%     Exam:  General: Alert, awake, oriented x3, in no acute distress.  HEENT: No bruits, no goiter. Positive JVD Heart: Regular rate and rhythm,  Lungs: Good air movement,  No lower extremity edema. Abdomen: Soft, nontender,  nondistended, positive bowel sounds.  Neuro: Grossly intact, nonfocal.   Data Reviewed: Basic Metabolic Panel:  Recent Labs Lab 09/11/14 1522 09/11/14 2200 09/12/14 0316 09/13/14 0234 09/14/14 0250  NA 139  --  137 141 137  K 2.8*  --  3.0* 2.8* 3.7  CL 106  --  104 109 104  CO2 24  --  22 24 25   GLUCOSE 145*  --  254* 68 187*  BUN 14  --  20 20 20   CREATININE 1.28*  --  1.34* 1.39* 1.36*  CALCIUM 7.5*  --  7.1* 7.6* 7.9*  MG 1.7 1.5*  --  1.8  --    Liver Function Tests:  Recent Labs Lab 09/11/14 1522  AST 22  ALT 22  ALKPHOS 96  BILITOT 0.4  PROT 6.7  ALBUMIN 2.7*   No results for input(s): LIPASE, AMYLASE in the last 168 hours. No results for input(s): AMMONIA in the last 168 hours. CBC:  Recent Labs Lab 09/11/14 1522  WBC 5.7  HGB 14.4  HCT 42.3  MCV 95.1  PLT 210   Cardiac Enzymes:  Recent Labs Lab 09/11/14 1522  TROPONINI 0.04*   BNP (last 3 results)  Recent Labs  09/11/14 1522  BNP 3381.1*    ProBNP (last 3 results) No results for input(s): PROBNP in the last 8760 hours.  CBG:  Recent Labs Lab  09/13/14 0702 09/13/14 1118 09/13/14 1630 09/13/14 2104 09/14/14 0622  GLUCAP 68 315* 118* 372* 109*    No results found for this or any previous visit (from the past 240 hour(s)).   Studies: No results found.  Scheduled Meds: . aspirin EC  81 mg Oral Daily  . enoxaparin (LOVENOX) injection  40 mg Subcutaneous Q24H  . feeding supplement (ENSURE ENLIVE)  237 mL Oral BID BM  . furosemide  80 mg Intravenous Q12H  . insulin aspart  0-5 Units Subcutaneous QHS  . insulin aspart  0-9 Units Subcutaneous TID WC  . insulin aspart  3 Units Subcutaneous TID WC  . insulin detemir  5 Units Subcutaneous QHS  . isosorbide-hydrALAZINE  2 tablet Oral BID  . lisinopril  10 mg Oral Daily  . metoprolol tartrate  25 mg Oral BID  . potassium chloride  40 mEq Oral TID  . sodium chloride  3 mL Intravenous Q12H   Continuous Infusions:    Charlynne Cousins  [facility] Pager [phone] If 7PM-7AM, please contact night-coverage at [email or web address], password [facility] 09/14/2014, 9:28 AM  LOS: 3 days

## 2014-09-14 NOTE — Evaluation (Addendum)
Physical Therapy Evaluation Patient Details Name: Carmen [name] MRN: [id number] DOB: 10/30/1933 Today's Date: 09/14/2014   History of Present Illness  Patient is a 81 yo female admitted 09/11/14 with acute resp failure with hypoxia, CHF, HTN.  PMH:  CVA with Rt hemiparesis, CHF, DM, HTN, CAD, back surgery, PVD, pacemaker  Clinical Impression  Patient presents with problems listed below.  Will benefit from acute PT to maximize functional independence prior to discharge home with family.  Patient close to baseline mobility.      Follow Up Recommendations No PT follow up;Supervision/Assistance - 24 hour    Equipment Recommendations  None recommended by PT    Recommendations for Other Services       Precautions / Restrictions Precautions Precautions: Fall Restrictions Weight Bearing Restrictions: No      Mobility  Bed Mobility               General bed mobility comments: Patient sitting EOB as PT entered room  Transfers Overall transfer level: Needs assistance Equipment used: None Transfers: Sit to/from Stand Sit to Stand: Supervision         General transfer comment: Supervision for safety only  Ambulation/Gait Ambulation/Gait assistance: Min guard Ambulation Distance (Feet): 92 Feet Assistive device: None Gait Pattern/deviations: Step-through pattern;Decreased stride length Gait velocity: Decreased Gait velocity interpretation: Below normal speed for age/gender General Gait Details: Patient with slow guarded gait.  Fairly steady with no assistive device. No DOE noted with gait on room air.  Patient uses LUE to support RUE during gait due to Rt shoulder pain.  Provided sling for RUE to support shoulder during gait/sitting - decreased pain per patient report.  Stairs            Wheelchair Mobility    Modified Rankin (Stroke Patients Only)       Balance Overall balance assessment: No apparent balance deficits (not formally assessed)                                            Pertinent Vitals/Pain Pain Assessment: 0-10 Pain Score: 10-Worst pain ever Pain Location: Rt shoulder and low back Pain Descriptors / Indicators: Aching Pain Intervention(s): Monitored during session;Repositioned;Other (comment) (Patient declined pain meds; Sling RUE for shoulder pain)    Home Living Family/patient expects to be discharged to:: Private residence Living Arrangements: Spouse/significant other;Children Available Help at Discharge: Family;Available 24 hours/day Type of Home: House Home Access: Stairs to enter Entrance Stairs-Rails: Psychiatric nurse of Steps: 2 Home Layout: One level Home Equipment: Walker - 2 wheels;Shower seat;Cane - single point;Bedside commode;Hand held shower head      Prior Function Level of Independence: Independent;Needs assistance   Gait / Transfers Assistance Needed: Ambulates iwth no assistive device  ADL's / Homemaking Assistance Needed: Husband and son prepare meals and complete housekeeping.  Patient independent with shower.        Hand Dominance   Dominant Hand: Right (Learned to use Lt hand after CVA)    Extremity/Trunk Assessment   Upper Extremity Assessment: RUE deficits/detail RUE Deficits / Details: Decreased strength and ROM from prior CVA in 1970's.  Shoulder movement 2-/5 with ROM 20* flexion AAROM; Elbow in flexed position with 2-/5 strength and -40* extension; wrist/hand with 3-/5 strength RUE: Unable to fully assess due to pain (Shoulder very painful.  Sling decreased pain)       Lower Extremity  Assessment: Generalized weakness (LLE 4+/5 and RLE 4/5 strength)         Communication   Communication: No difficulties  Cognition Arousal/Alertness: Awake/alert Behavior During Therapy: WFL for tasks assessed/performed Overall Cognitive Status: Within Functional Limits for tasks assessed                      General Comments      Exercises         Assessment/Plan    PT Assessment Patient needs continued PT services  PT Diagnosis Abnormality of gait;Generalized weakness;Acute pain   PT Problem List Decreased strength;Decreased range of motion;Decreased activity tolerance;Decreased mobility;Pain  PT Treatment Interventions Gait training;Functional mobility training;Therapeutic activities;Patient/family education   PT Goals (Current goals can be found in the Care Plan section) Acute Rehab PT Goals Patient Stated Goal: To return home PT Goal Formulation: With patient Time For Goal Achievement: 09/21/14 Potential to Achieve Goals: Good    Frequency Min 3X/week   Barriers to discharge        Co-evaluation               End of Session Equipment Utilized During Treatment: Gait belt Activity Tolerance: Patient tolerated treatment well;Patient limited by pain Patient left: in bed;with call bell/phone within reach (sitting EOB) Nurse Communication: Mobility status (RN contacted Ortho Tech for sling)         Time: 3888-2800 PT Time Calculation (min) (ACUTE ONLY): 24 min   Charges:   PT Evaluation $Initial PT Evaluation Tier I: 1 Procedure PT Treatments $Gait Training: 8-22 mins   PT G CodesDespina Pole 09/21/2014, 6:06 PM Carita Pian. Sanjuana Kava, [place] Pager [phone]

## 2014-09-14 NOTE — Progress Notes (Signed)
Orthopedic Tech Progress Note Patient Details:  Carmen [name] [birth year] [id number]  Ortho Devices Type of Ortho Device: Arm sling Ortho Device/Splint Location: RUE Ortho Device/Splint Interventions: Ordered, Application   Braulio Bosch 09/14/2014, 3:34 PM

## 2014-09-15 LAB — GLUCOSE, CAPILLARY
GLUCOSE-CAPILLARY: 114 mg/dL — AB (ref 65–99)
Glucose-Capillary: 213 mg/dL — ABNORMAL HIGH (ref 65–99)
Glucose-Capillary: 344 mg/dL — ABNORMAL HIGH (ref 65–99)
Glucose-Capillary: 288 mg/dL — ABNORMAL HIGH (ref 65–99)

## 2014-09-15 LAB — BASIC METABOLIC PANEL
ANION GAP: 11 (ref 5–15)
BUN: 25 mg/dL — ABNORMAL HIGH (ref 6–20)
CALCIUM: 8.3 mg/dL — AB (ref 8.9–10.3)
CO2: 24 mmol/L (ref 22–32)
Chloride: 99 mmol/L — ABNORMAL LOW (ref 101–111)
Creatinine, Ser: 1.26 mg/dL — ABNORMAL HIGH (ref 0.44–1.00)
GFR calc Af Amer: 46 mL/min — ABNORMAL LOW (ref >60)
GFR calc non Af Amer: 40 mL/min — ABNORMAL LOW (ref >60)
Glucose, Bld: 126 mg/dL — ABNORMAL HIGH (ref 65–99)
Potassium: 3.6 mmol/L (ref 3.5–5.1)
SODIUM: 134 mmol/L — AB (ref 135–145)

## 2014-09-15 MED ORDER — ISOSORB DINITRATE-HYDRALAZINE 20-37.5 MG PO TABS: 2.0000 | ORAL_TABLET | Freq: Two times a day (BID) | ORAL | Status: DC

## 2014-09-15 MED ORDER — ISOSORB DINITRATE-HYDRALAZINE 20-37.5 MG PO TABS
2.0000 | ORAL_TABLET | Freq: Two times a day (BID) | ORAL | Status: DC
  Administered 2014-09-16: 2 via ORAL
  Filled 2014-09-15 (×2): qty 2

## 2014-09-15 MED ORDER — HYDRALAZINE HCL 50 MG PO TABS: 50.0000 mg | ORAL_TABLET | Freq: Four times a day (QID) | ORAL | Status: DC

## 2014-09-15 MED ORDER — ISOSORB DINITRATE-HYDRALAZINE 20-37.5 MG PO TABS
2.0000 | ORAL_TABLET | Freq: Three times a day (TID) | ORAL | Status: DC
  Administered 2014-09-15: 2 via ORAL
  Filled 2014-09-15 (×3): qty 2

## 2014-09-15 MED ORDER — METOLAZONE 2.5 MG PO TABS
2.5000 mg | ORAL_TABLET | Freq: Once | ORAL | Status: AC
  Administered 2014-09-15: 2.5 mg via ORAL
  Filled 2014-09-15: qty 1

## 2014-09-15 MED ORDER — ISOSORBIDE MONONITRATE ER 60 MG PO TB24: 120.0000 mg | ORAL_TABLET | Freq: Every day | ORAL | Status: DC

## 2014-09-15 MED ORDER — CLONIDINE HCL 0.2 MG PO TABS
0.2000 mg | ORAL_TABLET | Freq: Three times a day (TID) | ORAL | Status: DC
  Administered 2014-09-15: 0.2 mg via ORAL
  Filled 2014-09-15 (×4): qty 1

## 2014-09-15 NOTE — Progress Notes (Signed)
Pt.'s bp. Became low  Paged doctor he ordered a 500cc bolus and to recheak after bouls

## 2014-09-15 NOTE — Progress Notes (Signed)
[facility] PROGRESS NOTE  Filed Weights   09/13/14 0544 09/14/14 0458 09/15/14 0517  Weight: 53.797 kg (118 lb 9.6 oz) 53.842 kg (118 lb 11.2 oz) 47.9 kg (105 lb 9.6 oz)        Intake/Output Summary (Last 24 hours) at 09/15/14 0850 Last data filed at 09/15/14 0715  Gross per 24 hour  Intake    240 ml  Output   2600 ml  Net  -2360 ml     Assessment/Plan: Acute respiratory failure with hypoxia/  Pulmonary edema/ Acute diastolic heart failure - Unknown estimated dry weight,will cont IV diuretics for 1 additional day. - Cont IV lasix and metolazone.  Good diureisis - Cont Bidil, metoprolol and ACE-i - Low-sodium diet strict I's and O's and fluid restriction. Cr cont to be stable.  Protein-calorie malnutrition, severe Ensure   Renal failure (ARF), acute on chronic - At baseline  DM (diabetes mellitus), type 2, uncontrolled, periph vascular complic - decrease Levemir insulin sliding scale. Will continue CBGs before meals and at bedtime.   Hypokalemia: - resolved - Continue supplementation while diuresing.    Code Status: full Family Communication: daughter  Disposition Plan: Home 2-3 days.   Consultants:  none  Procedures: ECHO: none  Antibiotics:  None  HPI/Subjective: She relates her breathing is much better, able to lay flat to sleep.  Objective: Filed Vitals:   09/14/14 0458 09/14/14 1500 09/14/14 1958 09/15/14 0517  BP: 169/64 145/69 151/92 184/76  Pulse: 67 64 66 64  Temp: 98.6 F (37 C) 98.8 F (37.1 C) 98.1 F (36.7 C) 97.9 F (36.6 C)  TempSrc: Oral Oral Oral Oral  Resp: 18 18 18 18   Height:      Weight: 53.842 kg (118 lb 11.2 oz)   47.9 kg (105 lb 9.6 oz)  SpO2: 99% 99% 98% 98%     Exam:  General: Alert, awake, oriented x3, in no acute distress.  HEENT: No bruits, no goiter. Negative JVD Heart: Regular rate and rhythm,  Lungs: Good air movement,  No lower extremity edema. Abdomen: Soft, nontender, nondistended, positive  bowel sounds.  Neuro: Grossly intact, nonfocal.   Data Reviewed: Basic Metabolic Panel:  Recent Labs Lab 09/11/14 1522 09/11/14 2200 09/12/14 0316 09/13/14 0234 09/14/14 0250 09/15/14 0310  NA 139  --  137 141 137 134*  K 2.8*  --  3.0* 2.8* 3.7 3.6  CL 106  --  104 109 104 99*  CO2 24  --  22 24 25 24   GLUCOSE 145*  --  254* 68 187* 126*  BUN 14  --  20 20 20  25*  CREATININE 1.28*  --  1.34* 1.39* 1.36* 1.26*  CALCIUM 7.5*  --  7.1* 7.6* 7.9* 8.3*  MG 1.7 1.5*  --  1.8  --   --    Liver Function Tests:  Recent Labs Lab 09/11/14 1522  AST 22  ALT 22  ALKPHOS 96  BILITOT 0.4  PROT 6.7  ALBUMIN 2.7*   No results for input(s): LIPASE, AMYLASE in the last 168 hours. No results for input(s): AMMONIA in the last 168 hours. CBC:  Recent Labs Lab 09/11/14 1522  WBC 5.7  HGB 14.4  HCT 42.3  MCV 95.1  PLT 210   Cardiac Enzymes:  Recent Labs Lab 09/11/14 1522  TROPONINI 0.04*   BNP (last 3 results)  Recent Labs  09/11/14 1522  BNP 3381.1*    ProBNP (last 3 results) No results for input(s): PROBNP in the  last 8760 hours.  CBG:  Recent Labs Lab 09/14/14 0622 09/14/14 1131 09/14/14 1628 09/14/14 2121 09/15/14 0639  GLUCAP 109* 183* 243* 127* 114*    No results found for this or any previous visit (from the past 240 hour(s)).   Studies: No results found.  Scheduled Meds: . aspirin EC  81 mg Oral Daily  . cloNIDine  0.2 mg Oral 3 times per day  . enoxaparin (LOVENOX) injection  30 mg Subcutaneous Q24H  . feeding supplement (ENSURE ENLIVE)  237 mL Oral BID BM  . furosemide  80 mg Intravenous Q12H  . insulin aspart  0-5 Units Subcutaneous QHS  . insulin aspart  0-9 Units Subcutaneous TID WC  . insulin aspart  3 Units Subcutaneous TID WC  . insulin detemir  7 Units Subcutaneous QHS  . isosorbide-hydrALAZINE  2 tablet Oral TID  . lisinopril  20 mg Oral Daily  . metolazone  2.5 mg Oral Once  . metoprolol tartrate  25 mg Oral BID  . sodium  chloride  3 mL Intravenous Q12H   Continuous Infusions:    Charlynne Cousins  [facility] Pager [phone] If 7PM-7AM, please contact night-coverage at [email or web address], password [facility] 09/15/2014, 8:50 AM  LOS: 4 days

## 2014-09-16 LAB — BASIC METABOLIC PANEL
ANION GAP: 9 (ref 5–15)
BUN: 33 mg/dL — AB (ref 6–20)
CALCIUM: 7.8 mg/dL — AB (ref 8.9–10.3)
CHLORIDE: 101 mmol/L (ref 101–111)
CO2: 26 mmol/L (ref 22–32)
Creatinine, Ser: 1.51 mg/dL — ABNORMAL HIGH (ref 0.44–1.00)
GFR calc Af Amer: 37 mL/min — ABNORMAL LOW (ref >60)
GFR calc non Af Amer: 32 mL/min — ABNORMAL LOW (ref >60)
Glucose, Bld: 69 mg/dL (ref 65–99)
POTASSIUM: 3.5 mmol/L (ref 3.5–5.1)
SODIUM: 136 mmol/L (ref 135–145)

## 2014-09-16 LAB — GLUCOSE, CAPILLARY
Glucose-Capillary: 121 mg/dL — ABNORMAL HIGH (ref 65–99)
Glucose-Capillary: 51 mg/dL — ABNORMAL LOW (ref 65–99)
Glucose-Capillary: 162 mg/dL — ABNORMAL HIGH (ref 65–99)

## 2014-09-16 MED ORDER — LISINOPRIL 10 MG PO TABS: 10.0000 mg | ORAL_TABLET | Freq: Every day | ORAL | Status: DC

## 2014-09-16 MED ORDER — HYDRALAZINE HCL 100 MG PO TABS: 100.0000 mg | ORAL_TABLET | Freq: Three times a day (TID) | ORAL | Status: DC

## 2014-09-16 MED ORDER — FUROSEMIDE 20 MG PO TABS
20.0000 mg | ORAL_TABLET | Freq: Every day | ORAL | Status: DC
  Administered 2014-09-16: 20 mg via ORAL
  Filled 2014-09-16: qty 1

## 2014-09-16 MED ORDER — HYDRALAZINE HCL 100 MG PO TABS: 100.0000 mg | ORAL_TABLET | Freq: Four times a day (QID) | ORAL | Status: DC

## 2014-09-16 MED ORDER — FUROSEMIDE 20 MG PO TABS: 40.0000 mg | ORAL_TABLET | Freq: Every day | ORAL | Status: DC

## 2014-09-16 NOTE — Care Management (Signed)
Important Message  Patient Details  Name: Carmen [name] MRN: [id number] Date of Birth: [birth year]   Medicare Important Message Given:  Yes-third notification given    Dawayne Patricia, RN 09/16/2014, 10:47 AM

## 2014-09-16 NOTE — Progress Notes (Signed)
Reviewed discharge instructions with patient and family. Vitals stable. Iv removed. Patient awaiting discharge. Will continue to monitor.  Keshonda Monsour, Mervin Kung RN

## 2014-09-16 NOTE — Care Management (Signed)
Important Message  Patient Details  Name: Carmen [name] MRN: [id number] Date of Birth: Apr 26, 1933   Medicare Important Message Given:  Yes-third notification given    Nathen May 09/16/2014, 2:19 PM

## 2014-09-16 NOTE — Progress Notes (Signed)
Physical Therapy Treatment Patient Details Name: EMILLIA WEATHERLY MRN: [id number] DOB: [birth year] Today's Date: 09/16/2014    History of Present Illness Patient is a 81 yo female admitted 09/11/14 with acute resp failure with hypoxia, CHF, HTN.  PMH:  CVA with Rt hemiparesis, CHF, DM, HTN, CAD, back surgery, PVD, pacemaker    PT Comments    Pt progressing well towards physical therapy goals. Was able to perform transfers and ambulation without physical assistance, and no LOB noted. Pt states she has no concerns with returning home today. Offered HHPT services at d/c to improve overall function, and pt agreeable. Will continue to follow and progress as able per POC.   Follow Up Recommendations  Home health PT;Supervision/Assistance - 24 hour     Equipment Recommendations  None recommended by PT    Recommendations for Other Services       Precautions / Restrictions Precautions Precautions: Fall Restrictions Weight Bearing Restrictions: No    Mobility  Bed Mobility               General bed mobility comments: Patient sitting EOB as PT entered room  Transfers Overall transfer level: Needs assistance Equipment used: None Transfers: Sit to/from Stand Sit to Stand: Supervision         General transfer comment: No physical assist required. Pt was able to power-up to full standing without any noted unsteadiness.   Ambulation/Gait Ambulation/Gait assistance: Min guard Ambulation Distance (Feet): 200 Feet Assistive device: None Gait Pattern/deviations: Step-through pattern;Decreased stride length;Trunk flexed;Narrow base of support Gait velocity: Decreased Gait velocity interpretation: Below normal speed for age/gender General Gait Details: Patient with slow guarded gait. Had RUE sling donned for support. Fairly steady with no assistive device. No DOE noted with gait on room air.   Stairs            Wheelchair Mobility    Modified Rankin (Stroke Patients Only)       Balance Overall balance assessment: Needs assistance Sitting-balance support: Feet supported;No upper extremity supported Sitting balance-Leahy Scale: Good     Standing balance support: No upper extremity supported;During functional activity Standing balance-Leahy Scale: Fair                      Cognition Arousal/Alertness: Awake/alert Behavior During Therapy: WFL for tasks assessed/performed Overall Cognitive Status: Within Functional Limits for tasks assessed                      Exercises General Exercises - Lower Extremity Ankle Circles/Pumps: 10 reps Long Arc Quad: 10 reps Hip ABduction/ADduction: 10 reps    General Comments        Pertinent Vitals/Pain Pain Assessment: No/denies pain    Home Living                      Prior Function            PT Goals (current goals can now be found in the care plan section) Acute Rehab PT Goals Patient Stated Goal: Home today PT Goal Formulation: With patient Time For Goal Achievement: 09/21/14 Potential to Achieve Goals: Good Progress towards PT goals: Progressing toward goals    Frequency  Min 3X/week    PT Plan Current plan remains appropriate    Co-evaluation             End of Session Equipment Utilized During Treatment: Gait belt Activity Tolerance: Patient tolerated treatment well Patient left: in chair;with call  bell/phone within reach     Time: 0940-1000 PT Time Calculation (min) (ACUTE ONLY): 20 min  Charges:  $Gait Training: 8-22 mins                    G Codes:      Rolinda Roan 09-29-2014, 10:06 AM   Rolinda Roan, PT, DPT Acute Rehabilitation Services Pager: [phone]

## 2014-09-16 NOTE — Care Management Note (Signed)
Case Management Note  Patient Details  Name: Carmen [name] MRN: [id number] Date of Birth: [birth year]  Subjective/Objective:      Pt admitted with CHF              Action/Plan: PTA pt lived at home- NCM to follow for d/c needs  Expected Discharge Date:       09/16/14           Expected Discharge Plan:  [place]  In-House Referral:     Discharge planning Services  CM Consult  Post Acute Care Choice:  Home Health Choice offered to:  Patient  DME Arranged:    DME Agency:     HH Arranged:  RN, PT, OT, Nurse's Aide [place] Agency:  [facility]  Status of Service:  Completed, signed off  Medicare Important Message Given:  Yes-third notification given Date Medicare IM Given:    Medicare IM give by:    Date Additional Medicare IM Given:    Additional Medicare Important Message give by:     If discussed at [place] of Stay Meetings, dates discussed:    Additional Comments:  09/16/14- pt for d/c home today- orders for HH-RN/PT/OT/aide- spoke with pt at bedside to offer choice for [facility] agency in [facility]- per pt choice she would like to use [facility] for services- referral called to Santiago Glad with [facility] for [facility] services- start of services within 24-48 hr post discharge.   Marvetta Gibbons [place], RN- [phone] 09/16/2014, 11:12 AM

## 2014-09-16 NOTE — Progress Notes (Signed)
Hypoglycemic Event  CBG: 51  Treatment: Salli Real, Graham crackers  Symptoms: None  Follow-up CBG: LEXN:1700 CBG Result:121  Possible Reasons for Event: Unknown  Comments/MD notified:    Delman Kitten  Remember to initiate Hypoglycemia Order Set & complete

## 2014-09-16 NOTE — Discharge Summary (Signed)
Physician Discharge Summary  INARI SHIN YIF:[id number] DOB: 1933/06/19 DOA: 09/11/2014  PCP: Maximino Greenland, MD  Admit date: 09/11/2014 Discharge date: 09/16/2014  Time spent: 35 minutes  Recommendations for Outpatient Follow-up:  1. Follow-up with Dr. Tonie Griffith in one week we'll check her weight and titrate antihypertensive medications as tolerated. 2. Avoid clonidine.  Discharge Diagnoses:  Principal Problem:   Acute respiratory failure with hypoxia Active Problems:   Hypokalemia   DM (diabetes mellitus), type 2, uncontrolled, periph vascular complic   CKD (chronic kidney disease), stage III   Renal failure (ARF), acute on chronic   Protein-calorie malnutrition, severe   Pulmonary edema   Acute diastolic heart failure   Discharge Condition: stable  Diet recommendation: low sodium diet  Filed Weights   09/14/14 0458 09/15/14 0517 09/16/14 0521  Weight: 53.842 kg (118 lb 11.2 oz) 47.9 kg (105 lb 9.6 oz) 48.081 kg (106 lb)    History of present illness:  81 y.o. female with past apical history of chronic diastolic heart failure that comes in for shortness of breath that started 4 days prior to admission. He relates she was noticing with ambulation-cannot lay flat to sleep. So she called EMS on the day of admission when they got to her house she was wheezing and given albuterol and Solu-Medrol. She relates orthopnea no chest pain. She denies any nausea vomiting or diarrhea. No fevers or chills or cough.  Hospital Course:  Acute respiratory failure with hypoxia/ Pulmonary edema/ Acute diastolic heart failure: - She was admitted on the hospital start her on IV Lasix she diureses well metolazone was added. - Her dry weight improved to 48 kg. - We'll continue Lasix 40 mg twice a day - Cont hydralazine and Imdur, metoprolol and ACE-i - Low-sodium diet strict I's and O's and fluid restriction. Cr cont to be stable.  Protein-calorie malnutrition, severe Ensure   Renal failure  (ARF), acute on chronic - At baseline  DM (diabetes mellitus), type 2, uncontrolled, periph vascular complic - No changes were made to her medication.  Hypokalemia: - resolved - Continue supplementation while diuresing.  Procedures:  CXR  Consultations:  none  Discharge Exam: Filed Vitals:   09/16/14 0521  BP: 167/70  Pulse: 62  Temp: 97.7 F (36.5 C)  Resp:     General: A&O x3 Cardiovascular: RRR Respiratory: good air movement CTA B/l  Discharge Instructions   Discharge Instructions    Diet - low sodium heart healthy    Complete by:  As directed      Increase activity slowly    Complete by:  As directed           Current Discharge Medication List    START taking these medications   Details  lisinopril (ZESTRIL) 10 MG tablet Take 1 tablet (10 mg total) by mouth daily. Qty: 30 tablet, Refills: 0      CONTINUE these medications which have CHANGED   Details  furosemide (LASIX) 20 MG tablet Take 2 tablets (40 mg total) by mouth daily. Qty: 30 tablet, Refills: 3    hydrALAZINE (APRESOLINE) 100 MG tablet Take 1 tablet (100 mg total) by mouth 4 (four) times daily. Qty: 120 tablet, Refills: 4      CONTINUE these medications which have NOT CHANGED   Details  acetaminophen-codeine (TYLENOL #3) 300-30 MG per tablet Take 1 tablet by mouth every 6 (six) hours as needed for moderate pain. Qty: 20 tablet, Refills: 0    aspirin EC 81 MG tablet Take  81 mg by mouth daily.    insulin NPH-regular Human (NOVOLIN 70/30) (70-30) 100 UNIT/ML injection Inject 8 Units into the skin 2 (two) times daily with a meal. Qty: 10 mL, Refills: 11    metoprolol tartrate (LOPRESSOR) 25 MG tablet Take 1 tablet (25 mg total) by mouth 2 (two) times daily. Qty: 60 tablet, Refills: 4    Omega-3 Fatty Acids (FISH OIL PEARLS) 300 MG CAPS Take 1 tablet by mouth 2 (two) times daily.    ALPRAZolam (XANAX) 0.5 MG tablet Take 0.5 mg by mouth 3 (three) times daily as needed for anxiety.      isosorbide mononitrate (IMDUR) 120 MG 24 hr tablet Take 1 tablet (120 mg total) by mouth daily. Qty: 30 tablet, Refills: 0    mupirocin cream (BACTROBAN) 2 % Apply 1 application topically 2 (two) times daily. Qty: 15 g, Refills: 0    potassium chloride SA (K-DUR,KLOR-CON) 10 MEQ tablet Take 1 tablet (10 mEq total) by mouth daily. Qty: 30 tablet, Refills: 1      STOP taking these medications     ciprofloxacin (CIPRO) 250 MG tablet      cloNIDine (CATAPRES) 0.2 MG tablet        Allergies  Allergen Reactions  . Peanuts [Peanut Oil] Swelling  . Norvasc [Amlodipine Besylate] Swelling  . Peanut-Containing Drug Products Other (See Comments)    Cause my stomach to hurt   Follow-up Information    Follow up with Adrian Prows, MD In 1 week.   Specialty:  Cardiology   Why:  hospital follow up   Contact information:   [place] [id number] [phone]        The results of significant diagnostics from this hospitalization (including imaging, microbiology, ancillary and laboratory) are listed below for reference.    Significant Diagnostic Studies: Dg Chest Port 1 View  09/11/2014   CLINICAL DATA:  Worsening shortness of breath for past week. Wheezing. Decreased breath sounds on left.  EXAM: PORTABLE CHEST - 1 VIEW  COMPARISON:  08/03/2013  FINDINGS: Dual lead transvenous pacemaker remains in appropriate position. Increased diffuse interstitial infiltrates and symmetric bibasilar airspace disease seen, consistent with diffuse pulmonary edema. Probable small left pleural effusion present. Heart size remains within normal limits.  IMPRESSION: Diffuse pulmonary edema and probable small left pleural effusion.   Electronically Signed   By: Earle Gell M.D.   On: 09/11/2014 15:35    Microbiology: No results found for this or any previous visit (from the past 240 hour(s)).   Labs: Basic Metabolic Panel:  Recent Labs Lab 09/11/14 1522 09/11/14 2200  09/12/14 0316 09/13/14 0234 09/14/14 0250 09/15/14 0310 09/16/14 0337  NA 139  --  137 141 137 134* 136  K 2.8*  --  3.0* 2.8* 3.7 3.6 3.5  CL 106  --  104 109 104 99* 101  CO2 24  --  22 24 25 24 26   GLUCOSE 145*  --  254* 68 187* 126* 69  BUN 14  --  20 20 20  25* 33*  CREATININE 1.28*  --  1.34* 1.39* 1.36* 1.26* 1.51*  CALCIUM 7.5*  --  7.1* 7.6* 7.9* 8.3* 7.8*  MG 1.7 1.5*  --  1.8  --   --   --    Liver Function Tests:  Recent Labs Lab 09/11/14 1522  AST 22  ALT 22  ALKPHOS 96  BILITOT 0.4  PROT 6.7  ALBUMIN 2.7*   No results for  input(s): LIPASE, AMYLASE in the last 168 hours. No results for input(s): AMMONIA in the last 168 hours. CBC:  Recent Labs Lab 09/11/14 1522  WBC 5.7  HGB 14.4  HCT 42.3  MCV 95.1  PLT 210   Cardiac Enzymes:  Recent Labs Lab 09/11/14 1522  TROPONINI 0.04*   BNP: BNP (last 3 results)  Recent Labs  09/11/14 1522  BNP 3381.1*    ProBNP (last 3 results) No results for input(s): PROBNP in the last 8760 hours.  CBG:  Recent Labs Lab 09/15/14 1148 09/15/14 1346 09/15/14 2101 09/16/14 0630 09/16/14 0654  GLUCAP 213* 344* 288* 51* 121*       Signed:  Charlynne Cousins  [facility] 09/16/2014, 9:36 AM

## 2014-10-27 ENCOUNTER — Emergency Department (HOSPITAL_COMMUNITY)
Admission: EM | Admit: 2014-10-27 | Discharge: 2014-10-28 | Disposition: A | Discharge status: Home / Self Care | Payer: Medicare Other | Source: Home / Self Care | Attending: Emergency Medicine | Admitting: Emergency Medicine

## 2014-10-27 ENCOUNTER — Encounter (HOSPITAL_COMMUNITY): Payer: Self-pay

## 2014-10-27 DIAGNOSIS — E1151 Type 2 diabetes mellitus with diabetic peripheral angiopathy without gangrene: Secondary | ICD-10-CM | POA: Diagnosis not present

## 2014-10-27 DIAGNOSIS — I739 Peripheral vascular disease, unspecified: Secondary | ICD-10-CM

## 2014-10-27 DIAGNOSIS — M5432 Sciatica, left side: Secondary | ICD-10-CM

## 2014-10-27 DIAGNOSIS — E860 Dehydration: Secondary | ICD-10-CM | POA: Diagnosis not present

## 2014-10-27 MED ORDER — DIAZEPAM 5 MG PO TABS
5.0000 mg | ORAL_TABLET | Freq: Once | ORAL | Status: AC
  Administered 2014-10-27: 5 mg via ORAL
  Filled 2014-10-27: qty 1

## 2014-10-27 MED ORDER — HYDROMORPHONE HCL 1 MG/ML IJ SOLN
1.0000 mg | Freq: Once | INTRAMUSCULAR | Status: AC
  Administered 2014-10-27: 1 mg via INTRAMUSCULAR
  Filled 2014-10-27: qty 1

## 2014-10-27 MED ORDER — HYDROCODONE-ACETAMINOPHEN 5-325 MG PO TABS
1.0000 | ORAL_TABLET | Freq: Once | ORAL | Status: AC
  Administered 2014-10-27: 1 via ORAL
  Filled 2014-10-27: qty 1

## 2014-10-27 NOTE — ED Notes (Signed)
MD at bedside. 

## 2014-10-27 NOTE — ED Notes (Signed)
PER EMS: pt from home with complaints of left leg pain: mid thigh to her toes, ongoing for 7 days. Denies fall, denies trauma, no redness, no swelling. BP-160/54, HR-66, RR-16, CBG-102. A&O to baseline per family.

## 2014-10-27 NOTE — ED Provider Notes (Addendum)
CSN: [id number]     Arrival date & time 10/27/14  1944 History   First MD Initiated Contact with Patient 10/27/14 2010     Chief Complaint  Patient presents with  . Leg Pain     (Consider location/radiation/quality/duration/timing/severity/associated sxs/prior Treatment) Patient is a 81 y.o. female presenting with leg pain. The history is provided by the patient.  Leg Pain Location:  Hip, leg and foot Time since incident: going on for years but worse this week. Injury: no   Hip location:  L hip Leg location:  L upper leg and L lower leg Foot location:  L foot Pain details:    Quality:  Aching, burning and shooting   Radiates to:  Back   Severity:  Severe   Onset quality:  Gradual   Duration:  1 week   Timing:  Constant   Progression:  Worsening Chronicity:  Chronic Relieved by:  Nothing Worsened by:  Activity, bearing weight and extension Ineffective treatments: tylenol 3. Associated symptoms: back pain, decreased ROM and stiffness   Associated symptoms: no muscle weakness, no numbness, no swelling and no tingling   Associated symptoms comment:  No bowel incontinence or urinary retention Risk factors comment:  History of degenerative disc disease chronic sciatica   Past Medical History  Diagnosis Date  . Hypertension   . Coronary artery disease   . Renal disorder   . Herniated lumbar intervertebral disc   . Cataract     "both eyes; blood pressure's always too high to have them fixed" (09/11/2014)  . Renovascular hypertension      s/p left renal artery stent 12/2007.  S/P balloon angioplasty on 02/16/10 for ISR, BP was  controlled well since then.     . Claudication in peripheral vascular disease     02/16/10: Left CIA 9.0x28 Omnilink and REIA 8.0x40 seff expanding Zilver.   right subclavian artery stent 03/18/2008,  . Peripheral vascular disease   . Anxiety   . Pacemaker   . Pneumonia X 2  . IDDM (insulin dependent diabetes mellitus)   . Migraine     "used to have  terrible migraines; stopped in the 1990's"  . Stroke 1999    Stroke and TIA in 1999 with right sided weakness, now with residual right arm weakness (09/11/2014)  . Arthritis     "everywhere"  . Chronic lower back pain    Past Surgical History  Procedure Laterality Date  . Appendectomy    . Ureteral stent placement    . Carotid endarterectomy Left 1991     Stroke and TIA in 1999 with right sided weakness, now with residual right arm weakness  . Lumbar laminectomy      'herniated disc"  . Back surgery    . Colonoscopy  07/30/2011    Procedure: COLONOSCOPY;  Surgeon: Inda Castle, MD;  Location: [place];  Service: Endoscopy;  Laterality: N/A;  gi bleed  . Esophagogastroduodenoscopy  07/30/2011    Procedure: ESOPHAGOGASTRODUODENOSCOPY (EGD);  Surgeon: Inda Castle, MD;  Location: [place];  Service: Endoscopy;  Laterality: N/A;  . Permanent pacemaker insertion Left 06/28/2011    Procedure: PERMANENT PACEMAKER INSERTION;  Surgeon: Deboraha Sprang, MD;  Location: [facility] CATH LAB;  Service: Cardiovascular;  Laterality: Left;  . Abdominal angiogram N/A 07/06/2011    Procedure: ABDOMINAL ANGIOGRAM;  Surgeon: Laverda Page, MD;  Location: [facility] CATH LAB;  Service: Cardiovascular;  Laterality: N/A;  . Renal angiogram N/A 07/06/2011    Procedure: RENAL ANGIOGRAM;  Surgeon: Laverda Page, MD;  Location: [facility] CATH LAB;  Service: Cardiovascular;  Laterality: N/A;  . Lower extremity angiogram Right 05/29/2012    Procedure: LOWER EXTREMITY ANGIOGRAM;  Surgeon: Laverda Page, MD;  Location: [facility] CATH LAB;  Service: Cardiovascular;  Laterality: Right;  . Tonsillectomy    . Insert / replace / remove pacemaker    . Excisional hemorrhoidectomy    . Carpal tunnel release Left   . Vaginal hysterectomy     Family History  Problem Relation Age of Onset  . Cancer Father   . Heart attack Mother    Social History  Substance Use Topics  . Smoking status: Current Every Day Smoker -- 0.00 packs/day  for 70 years    Types: Cigarettes  . Smokeless tobacco: Never Used     Comment: 09/11/2014 "I used to smoke very heavy; down to 1 cigarette/day"  . Alcohol Use: Yes     Comment: "stopped drinking back in the 1970's"   OB History    No data available     Review of Systems  Musculoskeletal: Positive for back pain and stiffness.  All other systems reviewed and are negative.     Allergies  Peanuts; Norvasc; and Peanut-containing drug products  Home Medications   Prior to Admission medications   Medication Sig Start Date End Date Taking? Authorizing Provider  acetaminophen-codeine (TYLENOL #3) 300-30 MG per tablet Take 1 tablet by mouth every 6 (six) hours as needed for moderate pain. 12/03/13   Fransico Meadow, PA-C  ALPRAZolam Duanne Moron) 0.5 MG tablet Take 0.5 mg by mouth 3 (three) times daily as needed for anxiety.  05/30/13   Historical Provider, MD  aspirin EC 81 MG tablet Take 81 mg by mouth daily.    Historical Provider, MD  furosemide (LASIX) 20 MG tablet Take 2 tablets (40 mg total) by mouth daily. 09/16/14   Charlynne Cousins, MD  hydrALAZINE (APRESOLINE) 100 MG tablet Take 1 tablet (100 mg total) by mouth 3 (three) times daily. 09/16/14   Charlynne Cousins, MD  insulin NPH-regular Human (NOVOLIN 70/30) (70-30) 100 UNIT/ML injection Inject 8 Units into the skin 2 (two) times daily with a meal. 04/08/13   Eugenie Filler, MD  isosorbide mononitrate (IMDUR) 120 MG 24 hr tablet Take 1 tablet (120 mg total) by mouth daily. Patient not taking: Reported on 09/11/2014 12/12/12   Annita Brod, MD  lisinopril (ZESTRIL) 10 MG tablet Take 1 tablet (10 mg total) by mouth daily. 09/19/14   Charlynne Cousins, MD  metoprolol tartrate (LOPRESSOR) 25 MG tablet Take 1 tablet (25 mg total) by mouth 2 (two) times daily. 08/09/13   Ripudeep Krystal Eaton, MD  mupirocin cream (BACTROBAN) 2 % Apply 1 application topically 2 (two) times daily. Patient not taking: Reported on 09/11/2014 12/03/13   Fransico Meadow,  PA-C  Omega-3 Fatty Acids (FISH OIL PEARLS) 300 MG CAPS Take 1 tablet by mouth 2 (two) times daily.    Historical Provider, MD  potassium chloride SA (K-DUR,KLOR-CON) 10 MEQ tablet Take 1 tablet (10 mEq total) by mouth daily. Patient not taking: Reported on 09/11/2014 08/10/13   Ripudeep K Rai, MD   BP 129/49 mmHg  Pulse 60  Temp(Src) 98.4 F (36.9 C) (Oral)  Resp 12  SpO2 98% Physical Exam  Constitutional: She is oriented to person, place, and time. She appears well-developed and well-nourished. She appears distressed.  HENT:  Head: Normocephalic and atraumatic.  Mouth/Throat: Oropharynx is clear and moist.  Eyes: EOM are normal. Pupils are equal, round, and reactive to light.  Neck: Normal range of motion. Neck supple. No spinous process tenderness and no muscular tenderness present. No rigidity. Normal range of motion present.  Cardiovascular: Normal rate, regular rhythm, normal heart sounds and intact distal pulses.   No murmur heard. Pulmonary/Chest: Effort normal and breath sounds normal. She has no wheezes. She has no rales.  Abdominal: Soft. She exhibits no distension. There is no tenderness. There is no CVA tenderness.  Musculoskeletal: She exhibits tenderness.       Lumbar back: She exhibits decreased range of motion and pain. She exhibits no swelling, no deformity and normal pulse.  Slight coolness to the left toes with mild redness compared to the right. Less than 3 second capillary refill. No palpable DP and PT pulses and unable to doppler a pulse. Tenderness with palpation to the lateral left thigh and calf. No evidence of rashes. Pain worse with extension and point tenderness to the lumbar spine.  Dopplerable popliteal pulse.  Neurological: She is alert and oriented to person, place, and time. Coordination normal.  Reflex Scores:      Patellar reflexes are 1+ on the right side and 1+ on the left side. Skin: Skin is warm and dry. No rash noted.  Psychiatric: She has a normal  mood and affect.  Nursing note and vitals reviewed.   ED Course  Procedures (including critical care time) Labs Review Labs Reviewed - No data to display  Imaging Review No results found. I have personally reviewed and evaluated these images and lab results as part of my medical decision-making.   EKG Interpretation None      MDM   Final diagnoses:  Sciatica, left  PVD (peripheral vascular disease)    Pt with gradual onset of back pain suggestive of radiculopathy.  Patient has had similar pains for a long time but they are more pronounced this week.  No neurovascular compromise and no incontinence.  Pt has no infectious sx, hx of CA  or other red flags concerning for pathologic back pain.  Pt is able to ambulate but is painful.  Normal strength and reflexes on exam.  Denies trauma. Will give pt pain control and to return for developement of above sx. Patient given Vicodin and will reevaluate. Low suspicion for DVT, infectious cause of patient's symptoms. Patient does have a cooler foot on that side and unable to Doppler DP and PT pulses. Will speak with Dr. Einar Gip as he was the person who did her right lower extremity stenting.  10:45 PM Spoke with Dr. Einar Gip who states he knows this patient well and she always has a cool left extremity without palpable or dopplerable pulses. His appears to be her baseline. On repeat evaluation after Vicodin and Valium she had only minimal improvement in her pain. She was given an IM injection.  12:01 AM Pt feeling much better and d/ced home.  Blanchie Dessert, MD 10/28/14 0001  Blanchie Dessert, MD 10/28/14 [id number]

## 2014-10-27 NOTE — ED Notes (Signed)
Doppler to left foot. Unable to auscultate/palpate pedal pulses (dorsalis pedal, anterior/posterior tibial). MD notified.

## 2014-10-28 ENCOUNTER — Encounter (HOSPITAL_COMMUNITY): Payer: Self-pay

## 2014-10-28 ENCOUNTER — Other Ambulatory Visit (HOSPITAL_COMMUNITY): Payer: Medicare Other

## 2014-10-28 ENCOUNTER — Emergency Department (HOSPITAL_COMMUNITY): Payer: Medicare Other

## 2014-10-28 ENCOUNTER — Inpatient Hospital Stay (HOSPITAL_COMMUNITY)
Admission: EM | Admit: 2014-10-28 | Discharge: 2014-11-07 | DRG: 252 | Disposition: A | Discharge status: Skilled Nursing Facility | Payer: Medicare Other | Attending: Family Medicine | Admitting: Family Medicine

## 2014-10-28 DIAGNOSIS — M79605 Pain in left leg: Secondary | ICD-10-CM | POA: Diagnosis not present

## 2014-10-28 DIAGNOSIS — R339 Retention of urine, unspecified: Secondary | ICD-10-CM | POA: Diagnosis not present

## 2014-10-28 DIAGNOSIS — G629 Polyneuropathy, unspecified: Secondary | ICD-10-CM

## 2014-10-28 DIAGNOSIS — Z9119 Patient's noncompliance with other medical treatment and regimen: Secondary | ICD-10-CM

## 2014-10-28 DIAGNOSIS — F17210 Nicotine dependence, cigarettes, uncomplicated: Secondary | ICD-10-CM | POA: Diagnosis present

## 2014-10-28 DIAGNOSIS — G9340 Encephalopathy, unspecified: Secondary | ICD-10-CM | POA: Insufficient documentation

## 2014-10-28 DIAGNOSIS — I2510 Atherosclerotic heart disease of native coronary artery without angina pectoris: Secondary | ICD-10-CM | POA: Diagnosis present

## 2014-10-28 DIAGNOSIS — R319 Hematuria, unspecified: Secondary | ICD-10-CM | POA: Insufficient documentation

## 2014-10-28 DIAGNOSIS — N183 Chronic kidney disease, stage 3 unspecified: Secondary | ICD-10-CM | POA: Diagnosis present

## 2014-10-28 DIAGNOSIS — K5641 Fecal impaction: Secondary | ICD-10-CM | POA: Diagnosis present

## 2014-10-28 DIAGNOSIS — G9341 Metabolic encephalopathy: Secondary | ICD-10-CM | POA: Diagnosis not present

## 2014-10-28 DIAGNOSIS — I69359 Hemiplegia and hemiparesis following cerebral infarction affecting unspecified side: Secondary | ICD-10-CM

## 2014-10-28 DIAGNOSIS — E860 Dehydration: Secondary | ICD-10-CM | POA: Diagnosis present

## 2014-10-28 DIAGNOSIS — F419 Anxiety disorder, unspecified: Secondary | ICD-10-CM | POA: Diagnosis present

## 2014-10-28 DIAGNOSIS — D696 Thrombocytopenia, unspecified: Secondary | ICD-10-CM | POA: Diagnosis not present

## 2014-10-28 DIAGNOSIS — E11649 Type 2 diabetes mellitus with hypoglycemia without coma: Secondary | ICD-10-CM | POA: Diagnosis present

## 2014-10-28 DIAGNOSIS — Z7902 Long term (current) use of antithrombotics/antiplatelets: Secondary | ICD-10-CM

## 2014-10-28 DIAGNOSIS — G8929 Other chronic pain: Secondary | ICD-10-CM | POA: Diagnosis present

## 2014-10-28 DIAGNOSIS — M79609 Pain in unspecified limb: Secondary | ICD-10-CM | POA: Diagnosis not present

## 2014-10-28 DIAGNOSIS — E119 Type 2 diabetes mellitus without complications: Secondary | ICD-10-CM

## 2014-10-28 DIAGNOSIS — R627 Adult failure to thrive: Secondary | ICD-10-CM | POA: Diagnosis present

## 2014-10-28 DIAGNOSIS — Z681 Body mass index (BMI) 19 or less, adult: Secondary | ICD-10-CM | POA: Diagnosis not present

## 2014-10-28 DIAGNOSIS — Z7982 Long term (current) use of aspirin: Secondary | ICD-10-CM | POA: Diagnosis not present

## 2014-10-28 DIAGNOSIS — E1140 Type 2 diabetes mellitus with diabetic neuropathy, unspecified: Secondary | ICD-10-CM | POA: Diagnosis not present

## 2014-10-28 DIAGNOSIS — Z8673 Personal history of transient ischemic attack (TIA), and cerebral infarction without residual deficits: Secondary | ICD-10-CM | POA: Diagnosis not present

## 2014-10-28 DIAGNOSIS — I70213 Atherosclerosis of native arteries of extremities with intermittent claudication, bilateral legs: Secondary | ICD-10-CM | POA: Diagnosis present

## 2014-10-28 DIAGNOSIS — E870 Hyperosmolality and hypernatremia: Secondary | ICD-10-CM | POA: Diagnosis not present

## 2014-10-28 DIAGNOSIS — I998 Other disorder of circulatory system: Secondary | ICD-10-CM | POA: Diagnosis present

## 2014-10-28 DIAGNOSIS — R410 Disorientation, unspecified: Secondary | ICD-10-CM | POA: Diagnosis present

## 2014-10-28 DIAGNOSIS — Z950 Presence of cardiac pacemaker: Secondary | ICD-10-CM | POA: Diagnosis present

## 2014-10-28 DIAGNOSIS — N179 Acute kidney failure, unspecified: Secondary | ICD-10-CM | POA: Diagnosis present

## 2014-10-28 DIAGNOSIS — R4182 Altered mental status, unspecified: Secondary | ICD-10-CM | POA: Diagnosis not present

## 2014-10-28 DIAGNOSIS — E1122 Type 2 diabetes mellitus with diabetic chronic kidney disease: Secondary | ICD-10-CM | POA: Diagnosis present

## 2014-10-28 DIAGNOSIS — E1100 Type 2 diabetes mellitus with hyperosmolarity without nonketotic hyperglycemic-hyperosmolar coma (NKHHC): Secondary | ICD-10-CM | POA: Diagnosis present

## 2014-10-28 DIAGNOSIS — I150 Renovascular hypertension: Secondary | ICD-10-CM | POA: Diagnosis present

## 2014-10-28 DIAGNOSIS — Z794 Long term (current) use of insulin: Secondary | ICD-10-CM

## 2014-10-28 DIAGNOSIS — E1101 Type 2 diabetes mellitus with hyperosmolarity with coma: Secondary | ICD-10-CM | POA: Diagnosis not present

## 2014-10-28 DIAGNOSIS — E1151 Type 2 diabetes mellitus with diabetic peripheral angiopathy without gangrene: Secondary | ICD-10-CM | POA: Diagnosis present

## 2014-10-28 DIAGNOSIS — I70222 Atherosclerosis of native arteries of extremities with rest pain, left leg: Secondary | ICD-10-CM | POA: Diagnosis not present

## 2014-10-28 DIAGNOSIS — M545 Low back pain: Secondary | ICD-10-CM | POA: Diagnosis present

## 2014-10-28 DIAGNOSIS — E872 Acidosis: Secondary | ICD-10-CM | POA: Diagnosis not present

## 2014-10-28 DIAGNOSIS — H269 Unspecified cataract: Secondary | ICD-10-CM | POA: Diagnosis present

## 2014-10-28 DIAGNOSIS — I10 Essential (primary) hypertension: Secondary | ICD-10-CM | POA: Diagnosis not present

## 2014-10-28 DIAGNOSIS — E1165 Type 2 diabetes mellitus with hyperglycemia: Secondary | ICD-10-CM | POA: Diagnosis present

## 2014-10-28 DIAGNOSIS — E875 Hyperkalemia: Secondary | ICD-10-CM | POA: Diagnosis present

## 2014-10-28 DIAGNOSIS — N189 Chronic kidney disease, unspecified: Secondary | ICD-10-CM

## 2014-10-28 LAB — COMPREHENSIVE METABOLIC PANEL
ALK PHOS: 62 U/L (ref 38–126)
ALT: 36 U/L (ref 14–54)
AST: 27 U/L (ref 15–41)
Albumin: 3.9 g/dL (ref 3.5–5.0)
Anion gap: 9 (ref 5–15)
BUN: 90 mg/dL — AB (ref 6–20)
CHLORIDE: 113 mmol/L — AB (ref 101–111)
CO2: 14 mmol/L — AB (ref 22–32)
CREATININE: 2.23 mg/dL — AB (ref 0.44–1.00)
Calcium: 9.4 mg/dL (ref 8.9–10.3)
GFR calc Af Amer: 23 mL/min — ABNORMAL LOW (ref >60)
GFR, EST NON AFRICAN AMERICAN: 20 mL/min — AB (ref >60)
Glucose, Bld: 233 mg/dL — ABNORMAL HIGH (ref 65–99)
Potassium: 6.1 mmol/L (ref 3.5–5.1)
Sodium: 136 mmol/L (ref 135–145)
Total Bilirubin: 0.6 mg/dL (ref 0.3–1.2)
Total Protein: 7.1 g/dL (ref 6.5–8.1)

## 2014-10-28 LAB — CBC WITH DIFFERENTIAL/PLATELET
Basophils Absolute: 0 10*3/uL (ref 0.0–0.1)
Basophils Relative: 0 % (ref 0–1)
EOS ABS: 0 10*3/uL (ref 0.0–0.7)
EOS PCT: 0 % (ref 0–5)
HCT: 46.3 % — ABNORMAL HIGH (ref 36.0–46.0)
Hemoglobin: 15 g/dL (ref 12.0–15.0)
Lymphocytes Relative: 19 % (ref 12–46)
Lymphs Abs: 1.1 10*3/uL (ref 0.7–4.0)
MCH: 31.8 pg (ref 26.0–34.0)
MCHC: 32.4 g/dL (ref 30.0–36.0)
MCV: 98.3 fL (ref 78.0–100.0)
MONO ABS: 0.2 10*3/uL (ref 0.1–1.0)
MONOS PCT: 3 % (ref 3–12)
Neutro Abs: 4.6 10*3/uL (ref 1.7–7.7)
Neutrophils Relative %: 78 % — ABNORMAL HIGH (ref 43–77)
Platelets: 180 10*3/uL (ref 150–400)
RBC: 4.71 MIL/uL (ref 3.87–5.11)
RDW: 14.6 % (ref 11.5–15.5)
WBC: 5.9 10*3/uL (ref 4.0–10.5)

## 2014-10-28 LAB — URINALYSIS, ROUTINE W REFLEX MICROSCOPIC
BILIRUBIN URINE: NEGATIVE
GLUCOSE, UA: NEGATIVE mg/dL
Ketones, ur: NEGATIVE mg/dL
Leukocytes, UA: NEGATIVE
Nitrite: NEGATIVE
Protein, ur: 100 mg/dL — AB
SPECIFIC GRAVITY, URINE: 1.012 (ref 1.005–1.030)
UROBILINOGEN UA: 0.2 mg/dL (ref 0.0–1.0)
pH: 5 (ref 5.0–8.0)

## 2014-10-28 LAB — URINE MICROSCOPIC-ADD ON

## 2014-10-28 LAB — BASIC METABOLIC PANEL
Anion gap: 8 (ref 5–15)
BUN: 96 mg/dL — AB (ref 6–20)
CALCIUM: 9.7 mg/dL (ref 8.9–10.3)
CO2: 16 mmol/L — ABNORMAL LOW (ref 22–32)
CREATININE: 2.31 mg/dL — AB (ref 0.44–1.00)
Chloride: 113 mmol/L — ABNORMAL HIGH (ref 101–111)
GFR calc Af Amer: 22 mL/min — ABNORMAL LOW (ref >60)
GFR calc non Af Amer: 19 mL/min — ABNORMAL LOW (ref >60)
Glucose, Bld: 169 mg/dL — ABNORMAL HIGH (ref 65–99)
Potassium: 6.2 mmol/L (ref 3.5–5.1)
SODIUM: 137 mmol/L (ref 135–145)

## 2014-10-28 LAB — POTASSIUM: POTASSIUM: 6.4 mmol/L — AB (ref 3.5–5.1)

## 2014-10-28 LAB — CK: CK TOTAL: 185 U/L (ref 38–234)

## 2014-10-28 LAB — GLUCOSE, CAPILLARY: GLUCOSE-CAPILLARY: 238 mg/dL — AB (ref 65–99)

## 2014-10-28 MED ORDER — ISOSORBIDE MONONITRATE ER 60 MG PO TB24
120.0000 mg | ORAL_TABLET | Freq: Every day | ORAL | Status: DC
  Administered 2014-10-28 – 2014-11-07 (×11): 120 mg via ORAL
  Filled 2014-10-28 (×2): qty 2
  Filled 2014-10-28: qty 4
  Filled 2014-10-28: qty 2
  Filled 2014-10-28 (×3): qty 4
  Filled 2014-10-28 (×2): qty 2
  Filled 2014-10-28: qty 4
  Filled 2014-10-28: qty 2

## 2014-10-28 MED ORDER — ASPIRIN EC 81 MG PO TBEC
81.0000 mg | DELAYED_RELEASE_TABLET | Freq: Every day | ORAL | Status: DC
  Administered 2014-10-28 – 2014-11-07 (×11): 81 mg via ORAL
  Filled 2014-10-28 (×11): qty 1

## 2014-10-28 MED ORDER — INSULIN ASPART 100 UNIT/ML IV SOLN
5.0000 [IU] | Freq: Once | INTRAVENOUS | Status: AC
  Administered 2014-10-28: 5 [IU] via INTRAVENOUS
  Filled 2014-10-28: qty 0.05

## 2014-10-28 MED ORDER — SENNOSIDES-DOCUSATE SODIUM 8.6-50 MG PO TABS
2.0000 | ORAL_TABLET | Freq: Two times a day (BID) | ORAL | Status: DC
  Administered 2014-10-28 – 2014-11-06 (×18): 2 via ORAL
  Filled 2014-10-28 (×19): qty 2

## 2014-10-28 MED ORDER — OXYCODONE-ACETAMINOPHEN 5-325 MG PO TABS
2.0000 | ORAL_TABLET | Freq: Four times a day (QID) | ORAL | Status: DC | PRN
  Administered 2014-10-29 – 2014-10-30 (×4): 2 via ORAL
  Filled 2014-10-28 (×4): qty 2

## 2014-10-28 MED ORDER — OXYCODONE-ACETAMINOPHEN 5-325 MG PO TABS: 2.0000 | ORAL_TABLET | Freq: Four times a day (QID) | ORAL | Status: DC | PRN

## 2014-10-28 MED ORDER — INSULIN ASPART PROT & ASPART (70-30 MIX) 100 UNIT/ML ~~LOC~~ SUSP
8.0000 [IU] | Freq: Two times a day (BID) | SUBCUTANEOUS | Status: DC
  Administered 2014-10-28 – 2014-10-30 (×5): 8 [IU] via SUBCUTANEOUS
  Filled 2014-10-28: qty 10

## 2014-10-28 MED ORDER — SODIUM CHLORIDE 0.9 % IV BOLUS (SEPSIS)
500.0000 mL | Freq: Once | INTRAVENOUS | Status: AC
  Administered 2014-10-28: 500 mL via INTRAVENOUS

## 2014-10-28 MED ORDER — INSULIN ASPART 100 UNIT/ML ~~LOC~~ SOLN
0.0000 [IU] | Freq: Three times a day (TID) | SUBCUTANEOUS | Status: DC
  Administered 2014-10-28 – 2014-10-29 (×2): 5 [IU] via SUBCUTANEOUS
  Administered 2014-10-29: 15 [IU] via SUBCUTANEOUS
  Administered 2014-10-30: 2 [IU] via SUBCUTANEOUS
  Administered 2014-10-30: 5 [IU] via SUBCUTANEOUS
  Administered 2014-10-30: 3 [IU] via SUBCUTANEOUS

## 2014-10-28 MED ORDER — SODIUM CHLORIDE 0.9 % IV SOLN
INTRAVENOUS | Status: AC
  Administered 2014-10-28: 21:00:00 via INTRAVENOUS

## 2014-10-28 MED ORDER — CALCIUM GLUCONATE 10 % IV SOLN
1.0000 g | Freq: Once | INTRAVENOUS | Status: AC
  Administered 2014-10-28: 1 g via INTRAVENOUS
  Filled 2014-10-28: qty 10

## 2014-10-28 MED ORDER — CETYLPYRIDINIUM CHLORIDE 0.05 % MT LIQD
7.0000 mL | Freq: Two times a day (BID) | OROMUCOSAL | Status: DC
  Administered 2014-10-28 – 2014-11-07 (×19): 7 mL via OROMUCOSAL

## 2014-10-28 MED ORDER — FISH OIL PEARLS 300 MG PO CAPS: 1.0000 | ORAL_CAPSULE | Freq: Two times a day (BID) | ORAL | Status: DC

## 2014-10-28 MED ORDER — SODIUM BICARBONATE 8.4 % IV SOLN
50.0000 meq | Freq: Once | INTRAVENOUS | Status: DC
  Filled 2014-10-28: qty 50

## 2014-10-28 MED ORDER — DEXTROSE 5 % IV SOLN
1.0000 g | Freq: Once | INTRAVENOUS | Status: DC
  Administered 2014-10-28: 1 g via INTRAVENOUS
  Filled 2014-10-28: qty 10

## 2014-10-28 MED ORDER — DEXTROSE 5 % IV SOLN
1.0000 g | INTRAVENOUS | Status: DC
  Administered 2014-10-29: 1 g via INTRAVENOUS
  Filled 2014-10-28 (×2): qty 10

## 2014-10-28 MED ORDER — OMEGA-3-ACID ETHYL ESTERS 1 G PO CAPS
1.0000 g | ORAL_CAPSULE | Freq: Two times a day (BID) | ORAL | Status: DC
  Administered 2014-10-28 – 2014-11-07 (×19): 1 g via ORAL
  Filled 2014-10-28 (×20): qty 1

## 2014-10-28 MED ORDER — ENSURE ENLIVE PO LIQD
237.0000 mL | Freq: Two times a day (BID) | ORAL | Status: DC
  Administered 2014-10-28 – 2014-10-29 (×2): 237 mL via ORAL

## 2014-10-28 MED ORDER — SODIUM CHLORIDE 0.9 % IV SOLN
Freq: Once | INTRAVENOUS | Status: AC
  Administered 2014-10-28: 16:00:00 via INTRAVENOUS

## 2014-10-28 MED ORDER — ALBUTEROL SULFATE (2.5 MG/3ML) 0.083% IN NEBU
5.0000 mg | INHALATION_SOLUTION | Freq: Once | RESPIRATORY_TRACT | Status: AC
  Administered 2014-10-28: 5 mg via RESPIRATORY_TRACT
  Filled 2014-10-28: qty 6

## 2014-10-28 MED ORDER — DIAZEPAM 5 MG PO TABS: 5.0000 mg | ORAL_TABLET | Freq: Two times a day (BID) | ORAL | Status: DC

## 2014-10-28 MED ORDER — DEXTROSE 50 % IV SOLN
50.0000 mL | Freq: Once | INTRAVENOUS | Status: AC
  Administered 2014-10-28: 50 mL via INTRAVENOUS
  Filled 2014-10-28: qty 50

## 2014-10-28 MED ORDER — HYDRALAZINE HCL 50 MG PO TABS
100.0000 mg | ORAL_TABLET | Freq: Four times a day (QID) | ORAL | Status: DC
  Administered 2014-10-28 – 2014-11-07 (×39): 100 mg via ORAL
  Filled 2014-10-28 (×3): qty 2
  Filled 2014-10-28: qty 4
  Filled 2014-10-28 (×9): qty 2
  Filled 2014-10-28: qty 4
  Filled 2014-10-28 (×11): qty 2
  Filled 2014-10-28: qty 4
  Filled 2014-10-28 (×3): qty 2
  Filled 2014-10-28: qty 4
  Filled 2014-10-28 (×2): qty 2
  Filled 2014-10-28: qty 4
  Filled 2014-10-28 (×6): qty 2

## 2014-10-28 MED ORDER — CLOPIDOGREL BISULFATE 75 MG PO TABS
75.0000 mg | ORAL_TABLET | Freq: Every day | ORAL | Status: DC
  Administered 2014-10-28 – 2014-11-07 (×11): 75 mg via ORAL
  Filled 2014-10-28 (×11): qty 1

## 2014-10-28 MED ORDER — SODIUM BICARBONATE 8.4 % IV SOLN
50.0000 meq | Freq: Once | INTRAVENOUS | Status: AC
Start: 2014-10-28 — End: 2014-10-28
  Administered 2014-10-28: 50 meq via INTRAVENOUS
  Filled 2014-10-28: qty 50

## 2014-10-28 MED ORDER — POLYETHYLENE GLYCOL 3350 17 G PO PACK
17.0000 g | PACK | Freq: Every day | ORAL | Status: DC
  Administered 2014-10-28 – 2014-10-30 (×3): 17 g via ORAL
  Filled 2014-10-28 (×3): qty 1

## 2014-10-28 MED ORDER — METOPROLOL TARTRATE 25 MG PO TABS
25.0000 mg | ORAL_TABLET | Freq: Two times a day (BID) | ORAL | Status: DC
  Administered 2014-10-28 – 2014-11-01 (×8): 25 mg via ORAL
  Filled 2014-10-28 (×8): qty 1

## 2014-10-28 MED ORDER — HEPARIN SODIUM (PORCINE) 5000 UNIT/ML IJ SOLN
5000.0000 [IU] | Freq: Three times a day (TID) | INTRAMUSCULAR | Status: DC
  Administered 2014-10-28 – 2014-10-30 (×6): 5000 [IU] via SUBCUTANEOUS
  Filled 2014-10-28 (×6): qty 1

## 2014-10-28 NOTE — ED Notes (Signed)
Respiratory in with pt at the time. Will collect labs when complete.

## 2014-10-28 NOTE — ED Notes (Signed)
MD at bedside. ADMITTING MD Florencia Reasons PRESENT

## 2014-10-28 NOTE — ED Notes (Signed)
Patient transported to CT 

## 2014-10-28 NOTE — ED Notes (Signed)
MD at bedside. ZAVITZ

## 2014-10-28 NOTE — ED Notes (Signed)
Per [facility]- Pt seen and TX at [facility] for back pain. Released with narcotic meds and given narcotic meds while in ED. Family states pt was very weak and sedated when released. RN came this AM and evaluated this pt. MD called and referred pt to ED this am. Family continued pain meds this AM 20 minutes prior to EMS arrival. EMS given Narcan 0.5mg  for RR of 8. Recovery of RR to 14. Mentation improved slightly.

## 2014-10-28 NOTE — ED Notes (Signed)
Bed: ER15 Expected date:  Expected time:  Means of arrival:  Comments: EMS- 81yo M, weakness/ took ativan and percocet

## 2014-10-28 NOTE — H&P (Signed)
History and Physical  Carmen [name] WIO:[id number] DOB: 08-16-1933 DOA: 10/28/2014  Referring physician: EDP PCP: Maximino Greenland, MD   Chief Complaint: confusion ,left leg pain  HPI: Carmen [name] is a 81 y.o. female   With h/o htn/IDDM2/cva with chronic right upper extremity contracture, ckdIII brought to [facility] due to confusion, she was seen at [facility] [place] due to left leg pain last night, was  Given pain meds/ valium, this morning patient found more confused than usual, family called the ambulance.  Ed course: patient is confused ,c/o left leg pain, clinically dehydrated, vital with hypertension, lab showed hyperkalemia, arf, hematuria, ct ab no renal stone, no obstructive nephropathy, does show significant stool burden, Ct head no acute findings, ekg no acute changes, she is given hydration/albuterol/insulin. hospitalist called to admit the patient.  Patient is confused, not able to provide detailed history, granddaughter in room, reported patient has poor oral intake, recent weight loss of 8pounds, denies fever, denies gross hematuria.  Review of Systems:  Detail per HPI, Review of systems are otherwise negative  Past Medical History  Diagnosis Date  . Hypertension   . Coronary artery disease   . Renal disorder   . Herniated lumbar intervertebral disc   . Cataract     "both eyes; blood pressure's always too high to have them fixed" (09/11/2014)  . Renovascular hypertension      s/p left renal artery stent 12/2007.  S/P balloon angioplasty on 02/16/10 for ISR, BP was  controlled well since then.     . Claudication in peripheral vascular disease     02/16/10: Left CIA 9.0x28 Omnilink and REIA 8.0x40 seff expanding Zilver.   right subclavian artery stent 03/18/2008,  . Peripheral vascular disease   . Anxiety   . Pacemaker   . Pneumonia X 2  . IDDM (insulin dependent diabetes mellitus)   . Migraine     "used to have terrible migraines; stopped in the 1990's"  . Stroke 1999   Stroke and TIA in 1999 with right sided weakness, now with residual right arm weakness (09/11/2014)  . Arthritis     "everywhere"  . Chronic lower back pain    Past Surgical History  Procedure Laterality Date  . Appendectomy    . Ureteral stent placement    . Carotid endarterectomy Left 1991     Stroke and TIA in 1999 with right sided weakness, now with residual right arm weakness  . Lumbar laminectomy      'herniated disc"  . Back surgery    . Colonoscopy  07/30/2011    Procedure: COLONOSCOPY;  Surgeon: Inda Castle, MD;  Location: [place];  Service: Endoscopy;  Laterality: N/A;  gi bleed  . Esophagogastroduodenoscopy  07/30/2011    Procedure: ESOPHAGOGASTRODUODENOSCOPY (EGD);  Surgeon: Inda Castle, MD;  Location: [place];  Service: Endoscopy;  Laterality: N/A;  . Permanent pacemaker insertion Left 06/28/2011    Procedure: PERMANENT PACEMAKER INSERTION;  Surgeon: Deboraha Sprang, MD;  Location: [facility] CATH LAB;  Service: Cardiovascular;  Laterality: Left;  . Abdominal angiogram N/A 07/06/2011    Procedure: ABDOMINAL ANGIOGRAM;  Surgeon: Laverda Page, MD;  Location: [facility] CATH LAB;  Service: Cardiovascular;  Laterality: N/A;  . Renal angiogram N/A 07/06/2011    Procedure: RENAL ANGIOGRAM;  Surgeon: Laverda Page, MD;  Location: [facility] CATH LAB;  Service: Cardiovascular;  Laterality: N/A;  . Lower extremity angiogram Right 05/29/2012    Procedure: LOWER EXTREMITY ANGIOGRAM;  Surgeon: Laverda Page,  MD;  Location: [place] CATH LAB;  Service: Cardiovascular;  Laterality: Right;  . Tonsillectomy    . Insert / replace / remove pacemaker    . Excisional hemorrhoidectomy    . Carpal tunnel release Left   . Vaginal hysterectomy     Social History:  reports that she has been smoking Cigarettes.  She has been smoking about 0.00 packs per day for the past 70 years. She has never used smokeless tobacco. She reports that she drinks alcohol. She reports that she does not use illicit  drugs. Patient lives at home with family & is able to participate in activities of daily living independently with a walker  Allergies  Allergen Reactions  . Peanuts [Peanut Oil] Swelling  . Norvasc [Amlodipine Besylate] Swelling  . Peanut-Containing Drug Products Other (See Comments)    Cause my stomach to hurt    Family History  Problem Relation Age of Onset  . Cancer Father   . Heart attack Mother       Prior to Admission medications   Medication Sig Start Date End Date Taking? Authorizing Provider  aspirin EC 81 MG tablet Take 81 mg by mouth daily.   Yes Historical Provider, MD  benazepril (LOTENSIN) 40 MG tablet Take 40 mg by mouth daily.   Yes Historical Provider, MD  clopidogrel (PLAVIX) 75 MG tablet Take 75 mg by mouth daily.   Yes Historical Provider, MD  diazepam (VALIUM) 5 MG tablet Take 1 tablet (5 mg total) by mouth 2 (two) times daily. 10/28/14  Yes Blanchie Dessert, MD  furosemide (LASIX) 20 MG tablet Take 2 tablets (40 mg total) by mouth daily. Patient taking differently: Take 20 mg by mouth 2 (two) times daily.  09/16/14  Yes Charlynne Cousins, MD  hydrALAZINE (APRESOLINE) 100 MG tablet Take 1 tablet (100 mg total) by mouth 3 (three) times daily. Patient taking differently: Take 100 mg by mouth 4 (four) times daily.  09/16/14  Yes Charlynne Cousins, MD  insulin NPH-regular Human (NOVOLIN 70/30) (70-30) 100 UNIT/ML injection Inject 8 Units into the skin 2 (two) times daily with a meal. 04/08/13  Yes Eugenie Filler, MD  isosorbide mononitrate (IMDUR) 120 MG 24 hr tablet Take 1 tablet (120 mg total) by mouth daily. 12/12/12  Yes Annita Brod, MD  lisinopril (ZESTRIL) 10 MG tablet Take 1 tablet (10 mg total) by mouth daily. 09/19/14  Yes Charlynne Cousins, MD  metoprolol tartrate (LOPRESSOR) 25 MG tablet Take 1 tablet (25 mg total) by mouth 2 (two) times daily. 08/09/13  Yes Ripudeep Krystal Eaton, MD  Omega-3 Fatty Acids (FISH OIL PEARLS) 300 MG CAPS Take 1 tablet by mouth  2 (two) times daily.   Yes Historical Provider, MD  oxyCODONE-acetaminophen (PERCOCET/ROXICET) 5-325 MG per tablet Take 2 tablets by mouth every 6 (six) hours as needed for severe pain. 10/28/14  Yes Blanchie Dessert, MD  potassium chloride SA (K-DUR,KLOR-CON) 10 MEQ tablet Take 1 tablet (10 mEq total) by mouth daily. 08/10/13  Yes Ripudeep Krystal Eaton, MD  mupirocin cream (BACTROBAN) 2 % Apply 1 application topically 2 (two) times daily. Patient not taking: Reported on 09/11/2014 12/03/13   Fransico Meadow, PA-C    Physical Exam: BP 153/56 mmHg  Pulse 73  Temp(Src) 98.4 F (36.9 C) (Rectal)  Resp 16  SpO2 95%  General:  Very frail, elderly female Eyes: PERRL ENT: dry oral mucosa Neck: supple, no JVD Cardiovascular: RRR Respiratory: CTABL Abdomen: soft/ND/ND, positive bowel sounds Skin: no  rash, increase skin turgor.  Musculoskeletal:  No edema Psychiatric: calm/cooperative Neurologic: oriented to person only, chronic right upper extremity contracture.          Labs on Admission:  Basic Metabolic Panel:  Recent Labs Lab 10/28/14 1245 10/28/14 1358  NA 137  --   K 6.2* 6.4*  CL 113*  --   CO2 16*  --   GLUCOSE 169*  --   BUN 96*  --   CREATININE 2.31*  --   CALCIUM 9.7  --    Liver Function Tests: No results for input(s): AST, ALT, ALKPHOS, BILITOT, PROT, ALBUMIN in the last 168 hours. No results for input(s): LIPASE, AMYLASE in the last 168 hours. No results for input(s): AMMONIA in the last 168 hours. CBC:  Recent Labs Lab 10/28/14 1245  WBC 5.9  NEUTROABS 4.6  HGB 15.0  HCT 46.3*  MCV 98.3  PLT 180   Cardiac Enzymes: No results for input(s): CKTOTAL, CKMB, CKMBINDEX, TROPONINI in the last 168 hours.  BNP (last 3 results)  Recent Labs  09/11/14 1522  BNP 3381.1*    ProBNP (last 3 results) No results for input(s): PROBNP in the last 8760 hours.  CBG: No results for input(s): GLUCAP in the last 168 hours.  Radiological Exams on Admission: Ct Head Wo  Contrast  10/28/2014   CLINICAL DATA:  Altered mental status with weakness. Took Ativan and Percocet.  EXAM: CT HEAD WITHOUT CONTRAST  TECHNIQUE: Contiguous axial images were obtained from the base of the skull through the vertex without intravenous contrast.  COMPARISON:  06/02/2013 and 04/30/2012  FINDINGS: Ventricles, cisterns and other CSF spaces are within normal. There is evidence of chronic ischemic microvascular disease. Chronic stable patchy low-attenuation and linear high density over the left frontal region, basal ganglia and insular cortex. No mass, mass effect, shift of midline structures or acute hemorrhage. No evidence of acute infarction. Subtle patchy low-attenuation over the pons which is stable. Remainder of the exam is unremarkable.  IMPRESSION: No acute intracranial findings.  Chronic ischemic microvascular disease and chronic change in the left frontal/insular region and basal ganglia.   Electronically Signed   By: Marin Olp M.D.   On: 10/28/2014 14:55   Ct Renal Stone Study  10/28/2014   CLINICAL DATA:  Hematuria, weakness.  Altered mental status.  EXAM: CT ABDOMEN AND PELVIS WITHOUT CONTRAST  TECHNIQUE: Multidetector CT imaging of the abdomen and pelvis was performed following the standard protocol without IV contrast.  COMPARISON:  04/18/2012  FINDINGS: Calcified granuloma in the left lower lobe. Lung bases are clear. No effusions. Heart is normal size. No pleural effusions. Heart is normal size. Densely calcified coronary arteries. Pacer wires partially imaged in the right heart.  Liver, gallbladder, adrenals are unremarkable. Bilateral renal cysts. Punctate calcifications in the lower pole of the left kidney. No hydronephrosis. Calcifications in the spleen compatible with old granulomas disease. Pancreas is atrophic. Pancreatic calcifications noted compatible with chronic pancreatitis. This is stable.  Aorta and iliac vessels are heavily calcified as are the branch vessels. No  aneurysm. Large stool burden in the colon, particularly rectosigmoid colon suggesting fecal impaction. Small bowel and stomach decompressed, grossly unremarkable. No free fluid, free air or adenopathy.  Prior hysterectomy. No visible adnexal masses. No free fluid, free air or adenopathy.  No acute bony abnormality or focal bone lesion. Postoperative changes in the lower lumbar spine.  IMPRESSION: Changes of chronic pancreatitis.  Old granulomas disease in the left lower lobe and spleen.  Coronary artery disease.  Large stool burden throughout the colon, particularly rectosigmoid colon suggesting fecal impaction.   Electronically Signed   By: Rolm Baptise M.D.   On: 10/28/2014 14:52    EKG: Independently reviewed. Sinus rhythm, chronic Twave inversion inferior lateral leads   Assessment/Plan Present on Admission:  . Altered mental status . Confusion  ARF on CKD III: likely from dehydration, clinically dry, family reported patient has been having poor oral intake, weight loss of 10pounds over the last few weeks, hold lasix, provided ivf. Check ck level.  Hyperkalemia: no acute EKG changes, both lotensin and lisinopril and potassium supplement listed on home meds, will hold this. Hold lasix as well due to dehydration. S/p calcium gluconate/insulin/albuterol, will give sodium bicarbx1, continue hydration. repeat bmp  Hematuria: family and patient denies gross hematuria, from infection? No kidney stone, no obstruction on CT . Empiric rocephin for now, if hematuria persist, will consider urology consult.  Confusion: metabolic +/- from pain meds/sedatives. CT head no acute changes, in confusion persist, consider repeat ct head, patient not a candidate for MRI due to h/o pacemaker.  Left leg pain:  poor historian, no exam left leg stiff, but no obvious deformity, no edema, no erythema. Prn pain meds. Does has history of herniated lumbar disc. Good pedal pulse less likely PVD.  IDDM2: recent a1c 8.7.  Continue home dose long acting insulin 70/30 8units bid, ssi  HTN: continue asa/lopressor/imdur/hydralzine, hold acei/lasix  H/p PVD s/p stent, h/o CVA with right upper extremity chronic contracture: continue asa/plavix.   FTT/Weight loss: family reported patient has decreased oral intake, 8pounds of weight loss over the last few weeks, progressive generalized weakness, patient reported cough when eating, will get nutrition consult/PT/OT/swallow eval.    DVT prophylaxis: heparin sub Q TID Diet: puree/necktar thick liquid for now, family reported patient swallow pills ok, awaiting for swallow eval  Consultants:   Code Status: full , confirmed with patient and family  Family Communication:  Patient and granddaughter  Disposition Plan: admit to med tele  Time spent: 56mins  Nareg Breighner MD, PhD [facility] Pager [phone] If 7PM-7AM, please contact night-coverage at [email or web address], password [facility]

## 2014-10-28 NOTE — ED Provider Notes (Signed)
CSN: [id number]     Arrival date & time 10/28/14  1214 History   First MD Initiated Contact with Patient 10/28/14 1231     Chief Complaint  Patient presents with  . Altered Mental Status  . Drug Overdose     (Consider location/radiation/quality/duration/timing/severity/associated sxs/prior Treatment) HPI Comments: 81 year old female with history of diabetes, high blood pressure, pacemaker follows Dr. Nadyne Coombes presents with altered mental status since yesterday. Patient is recent was seen in the ER and given narcotics and benzos for sciatica symptoms. Patient is had worsening confusion since then. No head injuries witnessed. No fevers or chills. Patient has had general lethargy through the day. Nothing is improved her symptoms.  Patient is a 81 y.o. female presenting with altered mental status and Overdose. The history is provided by a relative.  Altered Mental Status Drug Overdose    Past Medical History  Diagnosis Date  . Hypertension   . Coronary artery disease   . Renal disorder   . Herniated lumbar intervertebral disc   . Cataract     "both eyes; blood pressure's always too high to have them fixed" (09/11/2014)  . Renovascular hypertension      s/p left renal artery stent 12/2007.  S/P balloon angioplasty on 02/16/10 for ISR, BP was  controlled well since then.     . Claudication in peripheral vascular disease     02/16/10: Left CIA 9.0x28 Omnilink and REIA 8.0x40 seff expanding Zilver.   right subclavian artery stent 03/18/2008,  . Peripheral vascular disease   . Anxiety   . Pacemaker   . Pneumonia X 2  . IDDM (insulin dependent diabetes mellitus)   . Migraine     "used to have terrible migraines; stopped in the 1990's"  . Stroke 1999    Stroke and TIA in 1999 with right sided weakness, now with residual right arm weakness (09/11/2014)  . Arthritis     "everywhere"  . Chronic lower back pain    Past Surgical History  Procedure Laterality Date  . Appendectomy    . Ureteral  stent placement    . Carotid endarterectomy Left 1991     Stroke and TIA in 1999 with right sided weakness, now with residual right arm weakness  . Lumbar laminectomy      'herniated disc"  . Back surgery    . Colonoscopy  07/30/2011    Procedure: COLONOSCOPY;  Surgeon: Inda Castle, MD;  Location: [place];  Service: Endoscopy;  Laterality: N/A;  gi bleed  . Esophagogastroduodenoscopy  07/30/2011    Procedure: ESOPHAGOGASTRODUODENOSCOPY (EGD);  Surgeon: Inda Castle, MD;  Location: [place];  Service: Endoscopy;  Laterality: N/A;  . Permanent pacemaker insertion Left 06/28/2011    Procedure: PERMANENT PACEMAKER INSERTION;  Surgeon: Deboraha Sprang, MD;  Location: [facility] CATH LAB;  Service: Cardiovascular;  Laterality: Left;  . Abdominal angiogram N/A 07/06/2011    Procedure: ABDOMINAL ANGIOGRAM;  Surgeon: Laverda Page, MD;  Location: [facility] CATH LAB;  Service: Cardiovascular;  Laterality: N/A;  . Renal angiogram N/A 07/06/2011    Procedure: RENAL ANGIOGRAM;  Surgeon: Laverda Page, MD;  Location: [facility] CATH LAB;  Service: Cardiovascular;  Laterality: N/A;  . Lower extremity angiogram Right 05/29/2012    Procedure: LOWER EXTREMITY ANGIOGRAM;  Surgeon: Laverda Page, MD;  Location: [facility] CATH LAB;  Service: Cardiovascular;  Laterality: Right;  . Tonsillectomy    . Insert / replace / remove pacemaker    . Excisional hemorrhoidectomy    .  Carpal tunnel release Left   . Vaginal hysterectomy     Family History  Problem Relation Age of Onset  . Cancer Father   . Heart attack Mother    Social History  Substance Use Topics  . Smoking status: Current Every Day Smoker -- 0.00 packs/day for 70 years    Types: Cigarettes  . Smokeless tobacco: Never Used     Comment: 09/11/2014 "I used to smoke very heavy; down to 1 cigarette/day"  . Alcohol Use: Yes     Comment: "stopped drinking back in the 1970's"   OB History    No data available     Review of Systems  Unable to perform ROS:  Mental status change      Allergies  Peanuts; Norvasc; and Peanut-containing drug products  Home Medications   Prior to Admission medications   Medication Sig Start Date End Date Taking? Authorizing Provider  aspirin EC 81 MG tablet Take 81 mg by mouth daily.   Yes Historical Provider, MD  benazepril (LOTENSIN) 40 MG tablet Take 40 mg by mouth daily.   Yes Historical Provider, MD  clopidogrel (PLAVIX) 75 MG tablet Take 75 mg by mouth daily.   Yes Historical Provider, MD  diazepam (VALIUM) 5 MG tablet Take 1 tablet (5 mg total) by mouth 2 (two) times daily. 10/28/14  Yes Blanchie Dessert, MD  furosemide (LASIX) 20 MG tablet Take 2 tablets (40 mg total) by mouth daily. Patient taking differently: Take 20 mg by mouth 2 (two) times daily.  09/16/14  Yes Charlynne Cousins, MD  hydrALAZINE (APRESOLINE) 100 MG tablet Take 1 tablet (100 mg total) by mouth 3 (three) times daily. Patient taking differently: Take 100 mg by mouth 4 (four) times daily.  09/16/14  Yes Charlynne Cousins, MD  insulin NPH-regular Human (NOVOLIN 70/30) (70-30) 100 UNIT/ML injection Inject 8 Units into the skin 2 (two) times daily with a meal. 04/08/13  Yes Eugenie Filler, MD  isosorbide mononitrate (IMDUR) 120 MG 24 hr tablet Take 1 tablet (120 mg total) by mouth daily. 12/12/12  Yes Annita Brod, MD  lisinopril (ZESTRIL) 10 MG tablet Take 1 tablet (10 mg total) by mouth daily. 09/19/14  Yes Charlynne Cousins, MD  metoprolol tartrate (LOPRESSOR) 25 MG tablet Take 1 tablet (25 mg total) by mouth 2 (two) times daily. 08/09/13  Yes Ripudeep Krystal Eaton, MD  Omega-3 Fatty Acids (FISH OIL PEARLS) 300 MG CAPS Take 1 tablet by mouth 2 (two) times daily.   Yes Historical Provider, MD  oxyCODONE-acetaminophen (PERCOCET/ROXICET) 5-325 MG per tablet Take 2 tablets by mouth every 6 (six) hours as needed for severe pain. 10/28/14  Yes Blanchie Dessert, MD  potassium chloride SA (K-DUR,KLOR-CON) 10 MEQ tablet Take 1 tablet (10 mEq total) by  mouth daily. 08/10/13  Yes Ripudeep Krystal Eaton, MD  mupirocin cream (BACTROBAN) 2 % Apply 1 application topically 2 (two) times daily. Patient not taking: Reported on 09/11/2014 12/03/13   Fransico Meadow, PA-C   BP 153/56 mmHg  Pulse 73  Temp(Src) 98.4 F (36.9 C) (Rectal)  Resp 16  SpO2 94% Physical Exam  Constitutional: She appears well-developed and well-nourished.  HENT:  Head: Normocephalic and atraumatic.  Severe dry mucous membranes  Eyes: Right eye exhibits no discharge. Left eye exhibits no discharge.  Neck: Normal range of motion. Neck supple. No tracheal deviation present.  Cardiovascular: Normal rate and regular rhythm.   Pulmonary/Chest: Effort normal and breath sounds normal.  Abdominal: Soft. She  exhibits no distension. There is no tenderness. There is no guarding.  Musculoskeletal: She exhibits no edema.  Neurological: She is alert. GCS eye subscore is 3. GCS verbal subscore is 4. GCS motor subscore is 5.  Mumbling, general lethargy, difficulty appreciating details of speech, patient moves all extremities with general weakness equal.  Skin: Skin is warm. No rash noted.  Psychiatric:  Lethargy  Nursing note and vitals reviewed.   ED Course  Procedures (including critical care time) Labs Review Labs Reviewed  CBC WITH DIFFERENTIAL/PLATELET - Abnormal; Notable for the following:    HCT 46.3 (*)    Neutrophils Relative % 78 (*)    All other components within normal limits  BASIC METABOLIC PANEL - Abnormal; Notable for the following:    Potassium 6.2 (*)    Chloride 113 (*)    CO2 16 (*)    Glucose, Bld 169 (*)    BUN 96 (*)    Creatinine, Ser 2.31 (*)    GFR calc non Af Amer 19 (*)    GFR calc Af Amer 22 (*)    All other components within normal limits  URINALYSIS, ROUTINE W REFLEX MICROSCOPIC (NOT AT [facility]) - Abnormal; Notable for the following:    APPearance CLOUDY (*)    Hgb urine dipstick SMALL (*)    Protein, ur 100 (*)    All other components within normal  limits  URINE MICROSCOPIC-ADD ON - Abnormal; Notable for the following:    Squamous Epithelial / LPF FEW (*)    Bacteria, UA MANY (*)    All other components within normal limits  POTASSIUM - Abnormal; Notable for the following:    Potassium 6.4 (*)    All other components within normal limits  URINE CULTURE    Imaging Review Ct Head Wo Contrast  10/28/2014   CLINICAL DATA:  Altered mental status with weakness. Took Ativan and Percocet.  EXAM: CT HEAD WITHOUT CONTRAST  TECHNIQUE: Contiguous axial images were obtained from the base of the skull through the vertex without intravenous contrast.  COMPARISON:  06/02/2013 and 04/30/2012  FINDINGS: Ventricles, cisterns and other CSF spaces are within normal. There is evidence of chronic ischemic microvascular disease. Chronic stable patchy low-attenuation and linear high density over the left frontal region, basal ganglia and insular cortex. No mass, mass effect, shift of midline structures or acute hemorrhage. No evidence of acute infarction. Subtle patchy low-attenuation over the pons which is stable. Remainder of the exam is unremarkable.  IMPRESSION: No acute intracranial findings.  Chronic ischemic microvascular disease and chronic change in the left frontal/insular region and basal ganglia.   Electronically Signed   By: Marin Olp M.D.   On: 10/28/2014 14:55   Ct Renal Stone Study  10/28/2014   CLINICAL DATA:  Hematuria, weakness.  Altered mental status.  EXAM: CT ABDOMEN AND PELVIS WITHOUT CONTRAST  TECHNIQUE: Multidetector CT imaging of the abdomen and pelvis was performed following the standard protocol without IV contrast.  COMPARISON:  04/18/2012  FINDINGS: Calcified granuloma in the left lower lobe. Lung bases are clear. No effusions. Heart is normal size. No pleural effusions. Heart is normal size. Densely calcified coronary arteries. Pacer wires partially imaged in the right heart.  Liver, gallbladder, adrenals are unremarkable. Bilateral  renal cysts. Punctate calcifications in the lower pole of the left kidney. No hydronephrosis. Calcifications in the spleen compatible with old granulomas disease. Pancreas is atrophic. Pancreatic calcifications noted compatible with chronic pancreatitis. This is stable.  Aorta and iliac  vessels are heavily calcified as are the branch vessels. No aneurysm. Large stool burden in the colon, particularly rectosigmoid colon suggesting fecal impaction. Small bowel and stomach decompressed, grossly unremarkable. No free fluid, free air or adenopathy.  Prior hysterectomy. No visible adnexal masses. No free fluid, free air or adenopathy.  No acute bony abnormality or focal bone lesion. Postoperative changes in the lower lumbar spine.  IMPRESSION: Changes of chronic pancreatitis.  Old granulomas disease in the left lower lobe and spleen.  Coronary artery disease.  Large stool burden throughout the colon, particularly rectosigmoid colon suggesting fecal impaction.   Electronically Signed   By: Rolm Baptise M.D.   On: 10/28/2014 14:52   I have personally reviewed and evaluated these images and lab results as part of my medical decision-making.   EKG Interpretation   Date/Time:  Monday October 28 2014 12:51:06 EDT Ventricular Rate:  68 PR Interval:  162 QRS Duration: 88 QT Interval:  410 QTC Calculation: 436 R Axis:   39 Text Interpretation:  Sinus rhythm LVH with secondary repolarization  abnormality Anterior ST elevation, probably due to LVH overall similar  previous Confirmed by Elleana Stillson  MD, Lucky Alverson ([id number]) on 10/28/2014 2:05:08 PM      MDM   Final diagnoses:  Hematuria  Altered mental status, unspecified altered mental status type  Hyperkalemia  Acute renal failure, unspecified acute renal failure type  Dehydration    Patient presents with worsening lethargy since taking medicines for sciatica symptoms. Clinically patient dehydrated, IV fluid bolus ordered and saline drip. Concern for possible early  urinary infection versus kidney stone with worsening kidney function and hematuria. CT scan ordered and results reviewed no acute findings. Rocephin and urine cultures ordered. Discussed with triad hospitalist for further treatment/admission.. Hyper K treatment.  The patients results and plan were reviewed and discussed.   Any x-rays performed were independently reviewed by myself.   Differential diagnosis were considered with the presenting HPI.  Medications  0.9 %  sodium chloride infusion (not administered)  calcium gluconate 1 g in sodium chloride 0.9 % 100 mL IVPB (not administered)  albuterol (PROVENTIL) (2.5 MG/3ML) 0.083% nebulizer solution 5 mg (not administered)  cefTRIAXone (ROCEPHIN) 1 g in dextrose 5 % 50 mL IVPB (not administered)  0.9 %  sodium chloride infusion (not administered)  sodium chloride 0.9 % bolus 500 mL (500 mLs Intravenous New Bag/Given 10/28/14 1445)  dextrose 50 % solution 50 mL (50 mLs Intravenous Given 10/28/14 1448)  insulin aspart (novoLOG) injection 5 Units (5 Units Intravenous Given 10/28/14 1448)    Filed Vitals:   10/28/14 1234 10/28/14 1400 10/28/14 1446 10/28/14 1515  BP: 148/53  153/56 153/56  Pulse: 68 67  73  Temp: 98.4 F (36.9 C)     TempSrc: Rectal     Resp: 18 14 15 16   SpO2: 97% 95%  94%    Final diagnoses:  Hematuria  Altered mental status, unspecified altered mental status type  Hyperkalemia  Acute renal failure, unspecified acute renal failure type  Dehydration    Admission/ observation were discussed with the admitting physician, patient and/or family and they are comfortable with the plan.     Elnora Morrison, MD 10/28/14 [phone]

## 2014-10-28 NOTE — ED Notes (Signed)
Discharge instructions/prescriptions reviewed with daughter/patient. Understanding verbalized by daughter. Patient denies pain at time of discharge. No acute distress noted.

## 2014-10-29 ENCOUNTER — Inpatient Hospital Stay (HOSPITAL_COMMUNITY): Payer: Medicare Other

## 2014-10-29 ENCOUNTER — Encounter (HOSPITAL_COMMUNITY): Payer: Medicare Other

## 2014-10-29 DIAGNOSIS — F0390 Unspecified dementia without behavioral disturbance: Secondary | ICD-10-CM

## 2014-10-29 DIAGNOSIS — M79605 Pain in left leg: Secondary | ICD-10-CM

## 2014-10-29 DIAGNOSIS — G9341 Metabolic encephalopathy: Secondary | ICD-10-CM

## 2014-10-29 LAB — BASIC METABOLIC PANEL
ANION GAP: 10 (ref 5–15)
BUN: 86 mg/dL — ABNORMAL HIGH (ref 6–20)
CALCIUM: 9.2 mg/dL (ref 8.9–10.3)
CHLORIDE: 114 mmol/L — AB (ref 101–111)
CO2: 19 mmol/L — AB (ref 22–32)
Creatinine, Ser: 2.12 mg/dL — ABNORMAL HIGH (ref 0.44–1.00)
GFR calc non Af Amer: 21 mL/min — ABNORMAL LOW (ref >60)
GFR, EST AFRICAN AMERICAN: 25 mL/min — AB (ref >60)
Glucose, Bld: 135 mg/dL — ABNORMAL HIGH (ref 65–99)
Potassium: 5.5 mmol/L — ABNORMAL HIGH (ref 3.5–5.1)
SODIUM: 143 mmol/L (ref 135–145)

## 2014-10-29 LAB — CBC
HEMATOCRIT: 42.4 % (ref 36.0–46.0)
HEMOGLOBIN: 14 g/dL (ref 12.0–15.0)
MCH: 31.8 pg (ref 26.0–34.0)
MCHC: 33 g/dL (ref 30.0–36.0)
MCV: 96.4 fL (ref 78.0–100.0)
Platelets: 182 10*3/uL (ref 150–400)
RBC: 4.4 MIL/uL (ref 3.87–5.11)
RDW: 14.3 % (ref 11.5–15.5)
WBC: 4.3 10*3/uL (ref 4.0–10.5)

## 2014-10-29 LAB — GLUCOSE, CAPILLARY
GLUCOSE-CAPILLARY: 199 mg/dL — AB (ref 65–99)
GLUCOSE-CAPILLARY: 124 mg/dL — AB (ref 65–99)
GLUCOSE-CAPILLARY: 355 mg/dL — AB (ref 65–99)
GLUCOSE-CAPILLARY: 203 mg/dL — AB (ref 65–99)
Glucose-Capillary: 231 mg/dL — ABNORMAL HIGH (ref 65–99)

## 2014-10-29 MED ORDER — BOOST / RESOURCE BREEZE PO LIQD
1.0000 | Freq: Two times a day (BID) | ORAL | Status: DC
  Administered 2014-10-29 – 2014-11-07 (×17): 1 via ORAL

## 2014-10-29 MED ORDER — SODIUM CHLORIDE 0.9 % IV SOLN
INTRAVENOUS | Status: DC
  Administered 2014-10-29 – 2014-10-30 (×2): via INTRAVENOUS

## 2014-10-29 MED ORDER — HYDRALAZINE HCL 20 MG/ML IJ SOLN
10.0000 mg | Freq: Three times a day (TID) | INTRAMUSCULAR | Status: DC | PRN
  Administered 2014-10-29 – 2014-11-01 (×3): 10 mg via INTRAVENOUS
  Filled 2014-10-29 (×3): qty 1

## 2014-10-29 NOTE — Evaluation (Signed)
Clinical/Bedside Swallow Evaluation Patient Details  Name: Carmen [name] MRN: [id number] Date of Birth: Mar 31, 1933  Today's Date: 10/29/2014 Time: SLP Start Time (ACUTE ONLY): 0855 SLP Stop Time (ACUTE ONLY): 3532 SLP Time Calculation (min) (ACUTE ONLY): 27 min  Past Medical History:  Past Medical History  Diagnosis Date  . Hypertension   . Coronary artery disease   . Renal disorder   . Herniated lumbar intervertebral disc   . Cataract     "both eyes; blood pressure's always too high to have them fixed" (09/11/2014)  . Renovascular hypertension      s/p left renal artery stent 12/2007.  S/P balloon angioplasty on 02/16/10 for ISR, BP was  controlled well since then.     . Claudication in peripheral vascular disease     02/16/10: Left CIA 9.0x28 Omnilink and REIA 8.0x40 seff expanding Zilver.   right subclavian artery stent 03/18/2008,  . Peripheral vascular disease   . Anxiety   . Pacemaker   . Pneumonia X 2  . IDDM (insulin dependent diabetes mellitus)   . Migraine     "used to have terrible migraines; stopped in the 1990's"  . Stroke 1999    Stroke and TIA in 1999 with right sided weakness, now with residual right arm weakness (09/11/2014)  . Arthritis     "everywhere"  . Chronic lower back pain    Past Surgical History:  Past Surgical History  Procedure Laterality Date  . Appendectomy    . Ureteral stent placement    . Carotid endarterectomy Left 1991     Stroke and TIA in 1999 with right sided weakness, now with residual right arm weakness  . Lumbar laminectomy      'herniated disc"  . Back surgery    . Colonoscopy  07/30/2011    Procedure: COLONOSCOPY;  Surgeon: Inda Castle, MD;  Location: [place];  Service: Endoscopy;  Laterality: N/A;  gi bleed  . Esophagogastroduodenoscopy  07/30/2011    Procedure: ESOPHAGOGASTRODUODENOSCOPY (EGD);  Surgeon: Inda Castle, MD;  Location: [place];  Service: Endoscopy;  Laterality: N/A;  . Permanent pacemaker insertion  Left 06/28/2011    Procedure: PERMANENT PACEMAKER INSERTION;  Surgeon: Deboraha Sprang, MD;  Location: [facility] CATH LAB;  Service: Cardiovascular;  Laterality: Left;  . Abdominal angiogram N/A 07/06/2011    Procedure: ABDOMINAL ANGIOGRAM;  Surgeon: Laverda Page, MD;  Location: [facility] CATH LAB;  Service: Cardiovascular;  Laterality: N/A;  . Renal angiogram N/A 07/06/2011    Procedure: RENAL ANGIOGRAM;  Surgeon: Laverda Page, MD;  Location: [facility] CATH LAB;  Service: Cardiovascular;  Laterality: N/A;  . Lower extremity angiogram Right 05/29/2012    Procedure: LOWER EXTREMITY ANGIOGRAM;  Surgeon: Laverda Page, MD;  Location: [facility] CATH LAB;  Service: Cardiovascular;  Laterality: Right;  . Tonsillectomy    . Insert / replace / remove pacemaker    . Excisional hemorrhoidectomy    . Carpal tunnel release Left   . Vaginal hysterectomy     HPI:  pt is a 81 yo female adm to [facility] with confusion = recently seen at [facility] for back pain and was released.  Per granddaughter, pt took pain medicine and valium and became confused.  Swallow evaluation ordered.  PMH + for mildly delayed gastric empyting, CVA with right upper extremity contracture,  HTN, DM2.  Pt has a h/o reflux and an EGD completed in 2005 was normal but report indicated to continue PPI    Assessment / Plan /  Recommendation Clinical Impression  Pt presents with functional oropharyngeal swallow ability despite h/o CVA and lack of dentition.  She self fed crackers, applesauce and juice - no indication of airway compromise nor indication of oropharyngeal residuals.  Pt does have h/o borderline delayed gastric emptying per test completed in 2005.  She admits to weight loss which she contributes to decreased appetite, taste changes and pain.  Pt denies dysphagia.  Educated pt/family to compensation strategies to utilize if dysphagia is noted in future.  Will sign off as all education completed and pt with functional swallow.  Thanks for this consult.       Aspiration Risk  Mild    Diet Recommendation Age appropriate regular solids;Thin   Medication Administration: Whole meds with liquid (1 at a time) Compensations: Slow rate;Small sips/bites    Other  Recommendations Oral Care Recommendations: Oral care BID   Follow Up Recommendations       Frequency and Duration        Pertinent Vitals/Pain Afebrile, decreased      Swallow Study Prior Functional Status   pt eats regular goods at home, does not use dentures consistently    General Date of Onset: 10/29/14 Other Pertinent Information: pt is a 81 yo female adm to [facility] with confusion = recently seen at [facility] for back pain and was released.  Per granddaughter, pt took pain medicine and valium and became confused.  Swallow evaluation ordered.  PMH + for mildly delayed gastric empyting, CVA with right upper extremity contracture,  HTN, DM2.  Pt has a h/o reflux and an EGD completed in 2005 was normal but report indicated to continue PPI  Type of Study: Bedside swallow evaluation Diet Prior to this Study: Dysphagia 1 (puree);Nectar-thick liquids Temperature Spikes Noted: No Respiratory Status: Room air History of Recent Intubation: No Behavior/Cognition: Alert;Cooperative;Pleasant mood Oral Cavity - Dentition: Edentulous (pt has dentures that she does not consistently wear for meals) Self-Feeding Abilities: Able to feed self Patient Positioning: Upright in bed Baseline Vocal Quality: Low vocal intensity Volitional Cough: Weak    Oral/Motor/Sensory Function Overall Oral Motor/Sensory Function: Impaired at baseline   Ice Chips Ice chips: Not tested   Thin Liquid Thin Liquid: Within functional limits Presentation: Cup;Straw;Self Fed    Nectar Thick Nectar Thick Liquid: Not tested   Honey Thick Honey Thick Liquid: Not tested   Puree Puree: Within functional limits Presentation: Self Fed;Spoon   Solid   GO    Solid: Within functional limits Presentation: [place], [place] [facility] SLP [phone]

## 2014-10-29 NOTE — Progress Notes (Addendum)
PROGRESS NOTE  Carmen [name] PPI:[id number] DOB: 1933-05-17 DOA: 10/28/2014 PCP: Maximino Greenland, MD  HPI/Recap of past 24 hours:  More alert, though still very frail, much improved compare to yesterday,  c/o left lower leg pain bp elevated, k trending down  Assessment/Plan: Active Problems:   Altered mental status   Confusion  ARF on CKD III: likely from dehydration, clinically dry, family reported patient has been having poor oral intake, weight loss of 10pounds over the last few weeks, hold lasix, provided ivf.  ck wnl. Cr improving. Foley for 24hrs, monitor urine output and urine color.  Hyperkalemia: no acute EKG changes, both lotensin and lisinopril and potassium supplement listed on home meds, will hold this. Hold lasix as well due to dehydration. S/p calcium gluconate/insulin/albuterol, will give sodium bicarbx1, continue hydration. repeat bmp k improving  Hematuria: family and patient denies gross hematuria, from infection? No kidney stone, no obstruction on CT . Empiric rocephin for now, if hematuria persist, will consider urology consult. hgb stable, continue asa/plavix.  Confusion: metabolic +/- from pain meds/sedatives. CT head no acute changes, patient not a candidate for MRI due to h/o pacemaker. Improving. Likely has baseline dementia.  Left leg pain:  poor historian, no exam left leg stiff, but no obvious deformity, no edema, no erythema. Prn pain meds. Does has history of herniated lumbar disc.  More alert today on 8/23, able to localize pain to anterior lateral aspect of left lower leg. Will get leg xray,  ABI to r/p PVD, though no left leg pain is anterior lateral, no calf pain.  IDDM2: recent a1c 8.7. Continue home dose long acting insulin 70/30 8units bid, ssi  HTN: continue asa/lopressor/imdur/hydralzine, hold acei/lasix  H/p PVD s/p stent, h/o CVA with right upper extremity chronic contracture: continue asa/plavix.   FTT/Weight loss: family reported  patient has decreased oral intake, 8pounds of weight loss over the last few weeks, progressive generalized weakness, consult/PT/OT.     DVT prophylaxis: heparin sub Q TID Diet: pass swallow eval, on regular diet   Code Status: full, confirmed with patient and family  Family Communication: patient  And granddaughter  Disposition Plan: remain in patient, home health vs rehab   Consultants:  none  Procedures:  none  Antibiotics:  rocephin   Objective: BP 164/53 mmHg  Pulse 76  Temp(Src) 98.2 F (36.8 C) (Oral)  Resp 18  Ht 5\' 3"  (1.6 m)  Wt 93 lb 7.6 oz (42.4 kg)  BMI 16.56 kg/m2  SpO2 100%  Intake/Output Summary (Last 24 hours) at 10/29/14 1401 Last data filed at 10/29/14 0600  Gross per 24 hour  Intake   1690 ml  Output      0 ml  Net   1690 ml   Filed Weights   10/28/14 1606 10/28/14 1632  Weight: 99 lb (44.906 kg) 93 lb 7.6 oz (42.4 kg)    Exam:   General:  NAD, very frail, but much more alert compare to yesterday  Cardiovascular: RRR  Respiratory: CTABL  Abdomen: Soft/ND/NT, positive BS  Musculoskeletal: No Edema, left lower extremity anterior lateral aspect mild erythema? Significant tender, difficult pulpate pedal pulse, left foot slight cooler than right foot. No calf pain.  Neuro: oriented to person only, chronic right upper extremity contracture.  Data Reviewed: Basic Metabolic Panel:  Recent Labs Lab 10/28/14 1245 10/28/14 1358 10/28/14 1649 10/29/14 0434  NA 137  --  136 143  K 6.2* 6.4* 6.1* 5.5*  CL 113*  --  113* 114*  CO2 16*  --  14* 19*  GLUCOSE 169*  --  233* 135*  BUN 96*  --  90* 86*  CREATININE 2.31*  --  2.23* 2.12*  CALCIUM 9.7  --  9.4 9.2   Liver Function Tests:  Recent Labs Lab 10/28/14 1649  AST 27  ALT 36  ALKPHOS 62  BILITOT 0.6  PROT 7.1  ALBUMIN 3.9   No results for input(s): LIPASE, AMYLASE in the last 168 hours. No results for input(s): AMMONIA in the last 168 hours. CBC:  Recent  Labs Lab 10/28/14 1245 10/29/14 0434  WBC 5.9 4.3  NEUTROABS 4.6  --   HGB 15.0 14.0  HCT 46.3* 42.4  MCV 98.3 96.4  PLT 180 182   Cardiac Enzymes:    Recent Labs Lab 10/28/14 1649  CKTOTAL 185   BNP (last 3 results)  Recent Labs  09/11/14 1522  BNP 3381.1*    ProBNP (last 3 results) No results for input(s): PROBNP in the last 8760 hours.  CBG:  Recent Labs Lab 10/28/14 1643 10/28/14 2054 10/29/14 0807 10/29/14 1219  GLUCAP 238* 199* 124* 355*    Recent Results (from the past 240 hour(s))  Urine culture     Status: None (Preliminary result)   Collection Time: 10/28/14  1:56 PM  Result Value Ref Range Status   Specimen Description URINE, CATHETERIZED  Final   Special Requests NONE  Final   Culture   Final    NO GROWTH < 24 HOURS Performed at [facility]    Report Status PENDING  Incomplete     Studies: Ct Head Wo Contrast  10/28/2014   CLINICAL DATA:  Altered mental status with weakness. Took Ativan and Percocet.  EXAM: CT HEAD WITHOUT CONTRAST  TECHNIQUE: Contiguous axial images were obtained from the base of the skull through the vertex without intravenous contrast.  COMPARISON:  06/02/2013 and 04/30/2012  FINDINGS: Ventricles, cisterns and other CSF spaces are within normal. There is evidence of chronic ischemic microvascular disease. Chronic stable patchy low-attenuation and linear high density over the left frontal region, basal ganglia and insular cortex. No mass, mass effect, shift of midline structures or acute hemorrhage. No evidence of acute infarction. Subtle patchy low-attenuation over the pons which is stable. Remainder of the exam is unremarkable.  IMPRESSION: No acute intracranial findings.  Chronic ischemic microvascular disease and chronic change in the left frontal/insular region and basal ganglia.   Electronically Signed   By: Marin Olp M.D.   On: 10/28/2014 14:55   Ct Renal Stone Study  10/28/2014   CLINICAL DATA:  Hematuria,  weakness.  Altered mental status.  EXAM: CT ABDOMEN AND PELVIS WITHOUT CONTRAST  TECHNIQUE: Multidetector CT imaging of the abdomen and pelvis was performed following the standard protocol without IV contrast.  COMPARISON:  04/18/2012  FINDINGS: Calcified granuloma in the left lower lobe. Lung bases are clear. No effusions. Heart is normal size. No pleural effusions. Heart is normal size. Densely calcified coronary arteries. Pacer wires partially imaged in the right heart.  Liver, gallbladder, adrenals are unremarkable. Bilateral renal cysts. Punctate calcifications in the lower pole of the left kidney. No hydronephrosis. Calcifications in the spleen compatible with old granulomas disease. Pancreas is atrophic. Pancreatic calcifications noted compatible with chronic pancreatitis. This is stable.  Aorta and iliac vessels are heavily calcified as are the branch vessels. No aneurysm. Large stool burden in the colon, particularly rectosigmoid colon suggesting fecal impaction. Small bowel and stomach decompressed, grossly unremarkable. No  free fluid, free air or adenopathy.  Prior hysterectomy. No visible adnexal masses. No free fluid, free air or adenopathy.  No acute bony abnormality or focal bone lesion. Postoperative changes in the lower lumbar spine.  IMPRESSION: Changes of chronic pancreatitis.  Old granulomas disease in the left lower lobe and spleen.  Coronary artery disease.  Large stool burden throughout the colon, particularly rectosigmoid colon suggesting fecal impaction.   Electronically Signed   By: Rolm Baptise M.D.   On: 10/28/2014 14:52    Scheduled Meds: . antiseptic oral rinse  7 mL Mouth Rinse BID  . aspirin EC  81 mg Oral Daily  . cefTRIAXone (ROCEPHIN)  IV  1 g Intravenous Q24H  . clopidogrel  75 mg Oral Daily  . feeding supplement (ENSURE ENLIVE)  237 mL Oral BID BM  . heparin  5,000 Units Subcutaneous 3 times per day  . hydrALAZINE  100 mg Oral QID  . insulin aspart  0-15 Units  Subcutaneous TID WC  . insulin aspart protamine- aspart  8 Units Subcutaneous BID WC  . isosorbide mononitrate  120 mg Oral Daily  . metoprolol tartrate  25 mg Oral BID  . omega-3 acid ethyl esters  1 g Oral BID  . polyethylene glycol  17 g Oral Daily  . senna-docusate  2 tablet Oral BID  . sodium bicarbonate  50 mEq Intravenous Once    Continuous Infusions: . sodium chloride       Time spent: 53mins  Patton Rabinovich MD, PhD  [facility] Pager [phone]. If 7PM-7AM, please contact night-coverage at [email or web address], password [facility] 10/29/2014, 2:01 PM  LOS: 1 day

## 2014-10-29 NOTE — Progress Notes (Signed)
Initial Nutrition Assessment  DOCUMENTATION CODES:   Underweight, Severe malnutrition in context of acute illness/injury  INTERVENTION:  - Will d/c Ensure Enlive due to patient preferences - Will order Boost Breeze BID, each supplement provides 250 kcal and 9 grams of protein - RD will continue to monitor for needs  NUTRITION DIAGNOSIS:   Malnutrition related to acute illness as evidenced by energy intake < or equal to 50% for > or equal to 5 days, percent weight loss.  GOAL:   Patient will meet greater than or equal to 90% of their needs  MONITOR:   PO intake, Supplement acceptance, Weight trends, Labs, I & O's  REASON FOR ASSESSMENT:   Malnutrition Screening Tool  ASSESSMENT:   81 year old who was brought to [facility] ED due to confusion. She was seen at [facility] ED due to left leg pain last night and was given pain meds/ valium, this morning patient found more confused than usual, family called the ambulance.  Pt seen for MST. BMI indicates underweight status. Pt ate <25% of lunch at time of RD visit. Daughter reports that pt did not select items on the meal tray and that they are not foods she eats.   Per chart review, pt ate 75% of dinner last night. Daughter reports that pt typically has a very good appetite but that over the past week, due to pain, pt has not been eating well. She states that pt was very hungry last night and although she was on Dysphagia 1 at that time, pt consumed most of what was on the tray.  SLP saw pt this AM and current diet order is reflective of recommendations.   Ensure Jeanne Ivan has been ordered BID but pt refuses to drink it as she states she does not like it. Will trial Boost Breeze.  Pt requests physical assessment not be done at this time. Per chart review, pt has lost 13 lbs (12% body weight) in the past 1 month which is significant for time frame.   Medications reviewed. Labs reviewed; CBGs: 124-355 mg/dL, K: 5.5 mmol/L, CL: 114 mmol/L,  BUN/creatiine elevated but trending down, GFR: 21.   Diet Order:  Diet Carb Modified Fluid consistency:: Thin; Room service appropriate?: Yes with Assist  Skin:  Reviewed, no issues  Last BM:  PTA  Height:   Ht Readings from Last 1 Encounters:  10/28/14 5\' 3"  (1.6 m)    Weight:   Wt Readings from Last 1 Encounters:  10/28/14 93 lb 7.6 oz (42.4 kg)    Ideal Body Weight:  52.27 kg (kg)  BMI:  Body mass index is 16.56 kg/(m^2).  Estimated Nutritional Needs:   Kcal:  5329-9242  Protein:  45-60 grams  Fluid:  2 L/day  EDUCATION NEEDS:   No education needs identified at this time     Carmen [name], RD, LDN Inpatient Clinical Dietitian Pager # [phone] After hours/weekend pager # [phone]

## 2014-10-29 NOTE — Care Management Note (Signed)
Case Management Note  Patient Details  Name: Carmen [name] MRN: [id number] Date of Birth: 06-Nov-1933  Subjective/Objective:80 y/o f admitted w/AMS.From home. PT/OT cons-await recommendations. ST-thin liq.                   Action/Plan:Monitor progress for d/c plans.   Expected Discharge Date:   (unknown)               Expected Discharge Plan:  [place]  In-House Referral:     Discharge planning Services  CM Consult  Post Acute Care Choice:    Choice offered to:     DME Arranged:    DME Agency:     HH Arranged:    HH Agency:     Status of Service:  In process, will continue to follow  Medicare Important Message Given:    Date Medicare IM Given:    Medicare IM give by:    Date Additional Medicare IM Given:    Additional Medicare Important Message give by:     If discussed at [place] of Stay Meetings, dates discussed:    Additional Comments:  Dessa Phi, RN 10/29/2014, 1:47 PM

## 2014-10-29 NOTE — Progress Notes (Signed)
Foley catheter inserted.  600cc's of clear yellow urine returned.  Leg strap applied.

## 2014-10-30 ENCOUNTER — Encounter (HOSPITAL_COMMUNITY): Payer: Medicare Other

## 2014-10-30 ENCOUNTER — Other Ambulatory Visit (HOSPITAL_COMMUNITY): Payer: Medicare Other

## 2014-10-30 ENCOUNTER — Inpatient Hospital Stay (HOSPITAL_COMMUNITY): Payer: Medicare Other

## 2014-10-30 DIAGNOSIS — N183 Chronic kidney disease, stage 3 (moderate): Secondary | ICD-10-CM

## 2014-10-30 DIAGNOSIS — M79609 Pain in unspecified limb: Secondary | ICD-10-CM

## 2014-10-30 DIAGNOSIS — K5641 Fecal impaction: Secondary | ICD-10-CM

## 2014-10-30 LAB — URINE CULTURE: CULTURE: NO GROWTH

## 2014-10-30 LAB — BASIC METABOLIC PANEL
Anion gap: 10 (ref 5–15)
Anion gap: 6 (ref 5–15)
BUN: 65 mg/dL — AB (ref 6–20)
BUN: 53 mg/dL — AB (ref 6–20)
CALCIUM: 8.9 mg/dL (ref 8.9–10.3)
CHLORIDE: 110 mmol/L (ref 101–111)
CO2: 18 mmol/L — ABNORMAL LOW (ref 22–32)
CO2: 20 mmol/L — AB (ref 22–32)
CREATININE: 1.59 mg/dL — AB (ref 0.44–1.00)
Calcium: 8.4 mg/dL — ABNORMAL LOW (ref 8.9–10.3)
Chloride: 110 mmol/L (ref 101–111)
Creatinine, Ser: 1.7 mg/dL — ABNORMAL HIGH (ref 0.44–1.00)
GFR calc Af Amer: 32 mL/min — ABNORMAL LOW (ref >60)
GFR calc Af Amer: 35 mL/min — ABNORMAL LOW (ref >60)
GFR calc non Af Amer: 30 mL/min — ABNORMAL LOW (ref >60)
GFR, EST NON AFRICAN AMERICAN: 28 mL/min — AB (ref >60)
GLUCOSE: 126 mg/dL — AB (ref 65–99)
Glucose, Bld: 231 mg/dL — ABNORMAL HIGH (ref 65–99)
Potassium: 5.5 mmol/L — ABNORMAL HIGH (ref 3.5–5.1)
Potassium: 4.4 mmol/L (ref 3.5–5.1)
Sodium: 138 mmol/L (ref 135–145)
Sodium: 136 mmol/L (ref 135–145)

## 2014-10-30 LAB — GLUCOSE, CAPILLARY
GLUCOSE-CAPILLARY: 237 mg/dL — AB (ref 65–99)
Glucose-Capillary: 136 mg/dL — ABNORMAL HIGH (ref 65–99)
Glucose-Capillary: 186 mg/dL — ABNORMAL HIGH (ref 65–99)
Glucose-Capillary: 114 mg/dL — ABNORMAL HIGH (ref 65–99)

## 2014-10-30 MED ORDER — HYDROCODONE-ACETAMINOPHEN 5-325 MG PO TABS
1.0000 | ORAL_TABLET | Freq: Four times a day (QID) | ORAL | Status: DC | PRN
  Filled 2014-10-30: qty 1

## 2014-10-30 MED ORDER — BISACODYL 10 MG RE SUPP
10.0000 mg | Freq: Once | RECTAL | Status: AC
Start: 2014-10-30 — End: 2014-10-30
  Administered 2014-10-30: 10 mg via RECTAL
  Filled 2014-10-30: qty 1

## 2014-10-30 MED ORDER — HYDROMORPHONE HCL 1 MG/ML IJ SOLN
0.5000 mg | Freq: Four times a day (QID) | INTRAMUSCULAR | Status: DC | PRN
  Administered 2014-10-30 – 2014-10-31 (×5): 0.5 mg via INTRAVENOUS
  Filled 2014-10-30 (×5): qty 1

## 2014-10-30 MED ORDER — POLYETHYLENE GLYCOL 3350 17 G PO PACK
17.0000 g | PACK | Freq: Two times a day (BID) | ORAL | Status: DC
  Administered 2014-10-30 – 2014-11-04 (×10): 17 g via ORAL
  Filled 2014-10-30 (×13): qty 1

## 2014-10-30 MED ORDER — SODIUM POLYSTYRENE SULFONATE 15 GM/60ML PO SUSP
30.0000 g | Freq: Once | ORAL | Status: AC
  Administered 2014-10-30: 30 g via ORAL
  Filled 2014-10-30: qty 120

## 2014-10-30 MED ORDER — SODIUM CHLORIDE 0.9 % IV SOLN
INTRAVENOUS | Status: DC
  Administered 2014-10-30 – 2014-10-31 (×2): via INTRAVENOUS

## 2014-10-30 NOTE — Progress Notes (Signed)
VASCULAR LAB PRELIMINARY  PRELIMINARY  PRELIMINARY  PRELIMINARY  Left lower extremity venous duplex completed.    Preliminary report:  Left:  No evidence of DVT, superficial thrombosis, or Baker's cyst.  Beth Goodlin, RVS 10/30/2014, 10:09 AM

## 2014-10-30 NOTE — Progress Notes (Signed)
PT Cancellation Note  Patient Details Name: Carmen [name] MRN: [id number] DOB: 23-Jan-1934   Cancelled Treatment:    Reason Eval/Treat Not Completed: Pain limiting ability to participate (received pain meds however MD into room to assess leg and increased pain per pt)   Cj Edgell,KATHrine E 10/30/2014, 9:12 AM Carmelia Bake, PT, DPT 10/30/2014 Pager: [phone]

## 2014-10-30 NOTE — Progress Notes (Signed)
OT Cancellation Note  Patient Details Name: Carmen [name] MRN: [id number] DOB: 12-20-1933   Cancelled Treatment:    Reason Eval/Treat Not Completed: Patient at procedure or test/ unavailable  [name] 10/30/2014, 2:14 PM  Lesle Chris, OTR/L [phone] 10/30/2014

## 2014-10-30 NOTE — Care Management Important Message (Signed)
Important Message  Patient Details  Name: Carmen [name] MRN: [id number] Date of Birth: 1933-04-26   Medicare Important Message Given:  Yes-second notification given    Camillo Flaming 10/30/2014, 1:46 PMImportant Message  Patient Details  Name: Carmen [name] MRN: [id number] Date of Birth: 02/14/1934   Medicare Important Message Given:  Yes-second notification given    Camillo Flaming 10/30/2014, 1:45 PM

## 2014-10-30 NOTE — Progress Notes (Addendum)
PROGRESS NOTE    Carmen [name] DXA:[id number] DOB: 27-Apr-1933 DOA: 10/28/2014 PCP: Maximino Greenland, MD  HPI/Brief narrative 81 year old female with history of HTN, IDDM/DM 2, CVA with right upper extremity contracture, CKD3, chronic low back pain, CAD, PAD, PPM, history of intermittent left leg pain for the last 3-4 months which has progressively worsened in the last week, seen at [facility] ED on 8/21 at which time assessed to have radiculopathy related pain-EDP discussed with Dr. Einar Gip stated that she has chronically cool left extremity without palpable or dopplerable distal pulses. She was given pain medications and discharged home but returned and admitted to the [facility] on 10/28/14 due to confusion and persistent left leg pain. She was found with acute renal failure, hyperkalemia and hematuria.   Assessment/Plan:  Acute on stage III chronic kidney disease - Baseline creatinine probably in the 1.2-1.5 range. Admitted with creatinine of 2.3. - Most likely related to poor oral intake and dehydration. - No hydronephrosis by CT renal stone study. - Hydrated with IV fluids, creatinine improving and approaching baseline. Follow BMP.  Hyperkalemia - Secondary to acute on chronic kidney disease. Also on ACEI/lisinopril and potassium supplements at home-held. - Improving as AKI improves. - Provided a dose of Kayexalate 8/24. Follow BMP this evening. - ? RTA 4 also a possibility. - DC heparin which can at times cause hyperkalemia.  Left leg pain  - No reported trauma. Picture not consistent with peripheral neuropathy or radiculopathy pain. X-ray negative for fractures. Lower extremity venous Dopplers negative. ABI pending.  - ? Worsening PAD. Discussed with Dr. Einar Gip > recommended obtaining complete bilateral lower extremity arterial Dopplers and decision pending results and renal functions.  Hematuria  - Microscopic hematuria. CT renal stone study: No stone. Started empirically on IV  Rocephin for possible UTI-urine culture negative. Will DC IV Rocephin. Consider outpatient consultation with urology.   Acute encephalopathy - Secondary to metabolic derangement (acute renal failure) and opioids. Seems to have resolved. - CT head without acute findings.  Uncontrolled type II DM/IDDM - On home dose of 70/30 insulin and SSI added. Mildly uncontrolled and fluctuating. Monitor  Essential hypertension - Mildly uncontrolled. Continue metoprolol, Imdur and hydralazine. Holding ACEI secondary to acute renal failure.  PAD status post bilateral iliac stenting  CVA with residual R UE contracture - Continue aspirin and Plavix  Failure to thrive  Fecal impaction - Started bowel regimen.  S/P PPM   DVT prophylaxis: SCDs  Code Status: Full Family Communication: Discussed with granddaughter at bedside.  Disposition Plan: DC home when medically stable.   Consultants:  None  Procedures:  None  Antibiotics:  Rocephin 8/22 >  Subjective: Persistent left leg pain.  Objective: Filed Vitals:   10/29/14 1536 10/29/14 2203 10/30/14 0650 10/30/14 1110  BP: 131/47 151/57 193/64 162/63  Pulse: 64 71  67  Temp: 98 F (36.7 C) 98 F (36.7 C)    TempSrc: Oral Oral    Resp: 20 20    Height:      Weight:      SpO2: 98% 98%  97%    Intake/Output Summary (Last 24 hours) at 10/30/14 1403 Last data filed at 10/30/14 0931  Gross per 24 hour  Intake    590 ml  Output   1800 ml  Net  -1210 ml   Filed Weights   10/28/14 1606 10/28/14 1632  Weight: 44.906 kg (99 lb) 42.4 kg (93 lb 7.6 oz)     Exam:  General exam:  Pleasant elderly, frail female sitting up in bed eating breakfast this morning.  Respiratory system: Clear. No increased work of breathing. Cardiovascular system: S1 & S2 heard, RRR. No JVD, murmurs, gallops, clicks or pedal edema. Telemetry: Sinus rhythm.  Gastrointestinal system: Abdomen is nondistended, soft and nontender. Normal bowel sounds  heard. Central nervous system: Alert and oriented. No focal neurological deficits. Extremities: Symmetric 5 x 5 power. Left leg below knee cool to touch compared to right, absent left dorsalis pedis and posterior tibial pulses.? Febrile left popliteal pulse. Left femoral pulse well palpable.   Data Reviewed: Basic Metabolic Panel:  Recent Labs Lab 10/28/14 1245 10/28/14 1358 10/28/14 1649 10/29/14 0434 10/30/14 0511  NA 137  --  136 143 138  K 6.2* 6.4* 6.1* 5.5* 5.5*  CL 113*  --  113* 114* 110  CO2 16*  --  14* 19* 18*  GLUCOSE 169*  --  233* 135* 126*  BUN 96*  --  90* 86* 65*  CREATININE 2.31*  --  2.23* 2.12* 1.70*  CALCIUM 9.7  --  9.4 9.2 8.9   Liver Function Tests:  Recent Labs Lab 10/28/14 1649  AST 27  ALT 36  ALKPHOS 62  BILITOT 0.6  PROT 7.1  ALBUMIN 3.9   No results for input(s): LIPASE, AMYLASE in the last 168 hours. No results for input(s): AMMONIA in the last 168 hours. CBC:  Recent Labs Lab 10/28/14 1245 10/29/14 0434  WBC 5.9 4.3  NEUTROABS 4.6  --   HGB 15.0 14.0  HCT 46.3* 42.4  MCV 98.3 96.4  PLT 180 182   Cardiac Enzymes:  Recent Labs Lab 10/28/14 1649  CKTOTAL 185   BNP (last 3 results) No results for input(s): PROBNP in the last 8760 hours. CBG:  Recent Labs Lab 10/29/14 1219 10/29/14 1634 10/29/14 2157 10/30/14 0737 10/30/14 1109  GLUCAP 355* 203* 231* 136* 186*    Recent Results (from the past 240 hour(s))  Urine culture     Status: None   Collection Time: 10/28/14  1:56 PM  Result Value Ref Range Status   Specimen Description URINE, CATHETERIZED  Final   Special Requests NONE  Final   Culture   Final    NO GROWTH 1 DAY Performed at [facility]    Report Status 10/30/2014 FINAL  Final      Studies: Ct Head Wo Contrast  10/28/2014   CLINICAL DATA:  Altered mental status with weakness. Took Ativan and Percocet.  EXAM: CT HEAD WITHOUT CONTRAST  TECHNIQUE: Contiguous axial images were obtained from  the base of the skull through the vertex without intravenous contrast.  COMPARISON:  06/02/2013 and 04/30/2012  FINDINGS: Ventricles, cisterns and other CSF spaces are within normal. There is evidence of chronic ischemic microvascular disease. Chronic stable patchy low-attenuation and linear high density over the left frontal region, basal ganglia and insular cortex. No mass, mass effect, shift of midline structures or acute hemorrhage. No evidence of acute infarction. Subtle patchy low-attenuation over the pons which is stable. Remainder of the exam is unremarkable.  IMPRESSION: No acute intracranial findings.  Chronic ischemic microvascular disease and chronic change in the left frontal/insular region and basal ganglia.   Electronically Signed   By: Marin Olp M.D.   On: 10/28/2014 14:55   Dg Tibia/fibula Left Port  10/29/2014   CLINICAL DATA:  Patient with generalized lower extremity pain. No reported injury. Initial encounter.  EXAM: PORTABLE LEFT TIBIA AND FIBULA - 2 VIEW  COMPARISON:  None.  FINDINGS: Normal anatomic alignment. No evidence for acute fracture or dislocation. Regional soft tissues demonstrate vascular calcifications. Midfoot degenerative changes.  IMPRESSION: No acute osseous abnormality.   Electronically Signed   By: Lovey Newcomer M.D.   On: 10/29/2014 16:05   Ct Renal Stone Study  10/28/2014   CLINICAL DATA:  Hematuria, weakness.  Altered mental status.  EXAM: CT ABDOMEN AND PELVIS WITHOUT CONTRAST  TECHNIQUE: Multidetector CT imaging of the abdomen and pelvis was performed following the standard protocol without IV contrast.  COMPARISON:  04/18/2012  FINDINGS: Calcified granuloma in the left lower lobe. Lung bases are clear. No effusions. Heart is normal size. No pleural effusions. Heart is normal size. Densely calcified coronary arteries. Pacer wires partially imaged in the right heart.  Liver, gallbladder, adrenals are unremarkable. Bilateral renal cysts. Punctate calcifications in  the lower pole of the left kidney. No hydronephrosis. Calcifications in the spleen compatible with old granulomas disease. Pancreas is atrophic. Pancreatic calcifications noted compatible with chronic pancreatitis. This is stable.  Aorta and iliac vessels are heavily calcified as are the branch vessels. No aneurysm. Large stool burden in the colon, particularly rectosigmoid colon suggesting fecal impaction. Small bowel and stomach decompressed, grossly unremarkable. No free fluid, free air or adenopathy.  Prior hysterectomy. No visible adnexal masses. No free fluid, free air or adenopathy.  No acute bony abnormality or focal bone lesion. Postoperative changes in the lower lumbar spine.  IMPRESSION: Changes of chronic pancreatitis.  Old granulomas disease in the left lower lobe and spleen.  Coronary artery disease.  Large stool burden throughout the colon, particularly rectosigmoid colon suggesting fecal impaction.   Electronically Signed   By: Rolm Baptise M.D.   On: 10/28/2014 14:52        Scheduled Meds: . antiseptic oral rinse  7 mL Mouth Rinse BID  . aspirin EC  81 mg Oral Daily  . cefTRIAXone (ROCEPHIN)  IV  1 g Intravenous Q24H  . clopidogrel  75 mg Oral Daily  . feeding supplement  1 Container Oral BID BM  . hydrALAZINE  100 mg Oral QID  . insulin aspart  0-15 Units Subcutaneous TID WC  . insulin aspart protamine- aspart  8 Units Subcutaneous BID WC  . isosorbide mononitrate  120 mg Oral Daily  . metoprolol tartrate  25 mg Oral BID  . omega-3 acid ethyl esters  1 g Oral BID  . polyethylene glycol  17 g Oral Daily  . senna-docusate  2 tablet Oral BID   Continuous Infusions: . sodium chloride      Active Problems:   Altered mental status   Confusion    Time spent: 40 minutes.    Vernell Leep, MD, FACP, FHM. [facility] Pager [phone]  If 7PM-7AM, please contact night-coverage [email or web address] Password [email or web address] 10/30/2014, 2:03 PM    LOS: 2 days

## 2014-10-30 NOTE — Progress Notes (Addendum)
Duplex scan of th lower extremities will be performed 10/31/2014 at approx. 9 am Unable to completed 10/30/2014 due to computer and equipment issue with EPIC R.N. Notified and she stated that she would make to family and patient aware.Marland Kitchen

## 2014-10-31 ENCOUNTER — Inpatient Hospital Stay (HOSPITAL_COMMUNITY): Payer: Medicare Other

## 2014-10-31 DIAGNOSIS — M79605 Pain in left leg: Secondary | ICD-10-CM

## 2014-10-31 LAB — GLUCOSE, CAPILLARY
GLUCOSE-CAPILLARY: 75 mg/dL (ref 65–99)
GLUCOSE-CAPILLARY: 124 mg/dL — AB (ref 65–99)
GLUCOSE-CAPILLARY: 179 mg/dL — AB (ref 65–99)
Glucose-Capillary: 41 mg/dL — CL (ref 65–99)
Glucose-Capillary: 73 mg/dL (ref 65–99)
Glucose-Capillary: 84 mg/dL (ref 65–99)

## 2014-10-31 LAB — PROTIME-INR
INR: 0.96 (ref 0.00–1.49)
Prothrombin Time: 13 seconds (ref 11.6–15.2)

## 2014-10-31 LAB — BASIC METABOLIC PANEL
ANION GAP: 7 (ref 5–15)
BUN: 44 mg/dL — ABNORMAL HIGH (ref 6–20)
CALCIUM: 8.5 mg/dL — AB (ref 8.9–10.3)
CHLORIDE: 114 mmol/L — AB (ref 101–111)
CO2: 21 mmol/L — AB (ref 22–32)
Creatinine, Ser: 1.33 mg/dL — ABNORMAL HIGH (ref 0.44–1.00)
GFR calc Af Amer: 43 mL/min — ABNORMAL LOW (ref >60)
GFR calc non Af Amer: 37 mL/min — ABNORMAL LOW (ref >60)
GLUCOSE: 40 mg/dL — AB (ref 65–99)
POTASSIUM: 4.2 mmol/L (ref 3.5–5.1)
Sodium: 142 mmol/L (ref 135–145)

## 2014-10-31 LAB — APTT: aPTT: 29 seconds (ref 24–37)

## 2014-10-31 MED ORDER — HEPARIN BOLUS VIA INFUSION
1500.0000 [IU] | Freq: Once | INTRAVENOUS | Status: AC
  Administered 2014-10-31: 1500 [IU] via INTRAVENOUS
  Filled 2014-10-31: qty 1500

## 2014-10-31 MED ORDER — INSULIN ASPART PROT & ASPART (70-30 MIX) 100 UNIT/ML ~~LOC~~ SUSP
5.0000 [IU] | Freq: Two times a day (BID) | SUBCUTANEOUS | Status: DC
  Administered 2014-10-31 – 2014-11-03 (×7): 5 [IU] via SUBCUTANEOUS
  Filled 2014-10-31 (×2): qty 10

## 2014-10-31 MED ORDER — INSULIN ASPART 100 UNIT/ML ~~LOC~~ SOLN
0.0000 [IU] | Freq: Three times a day (TID) | SUBCUTANEOUS | Status: DC
  Administered 2014-10-31 – 2014-11-01 (×2): 2 [IU] via SUBCUTANEOUS
  Administered 2014-11-01: 1 [IU] via SUBCUTANEOUS
  Administered 2014-11-01: 2 [IU] via SUBCUTANEOUS
  Administered 2014-11-02: 1 [IU] via SUBCUTANEOUS
  Administered 2014-11-02: 3 [IU] via SUBCUTANEOUS
  Administered 2014-11-02: 1 [IU] via SUBCUTANEOUS
  Administered 2014-11-03: 2 [IU] via SUBCUTANEOUS
  Administered 2014-11-04: 1 [IU] via SUBCUTANEOUS
  Administered 2014-11-04 (×2): 5 [IU] via SUBCUTANEOUS
  Administered 2014-11-05: 2 [IU] via SUBCUTANEOUS
  Administered 2014-11-06: 9 [IU] via SUBCUTANEOUS
  Administered 2014-11-06: 3 [IU] via SUBCUTANEOUS
  Administered 2014-11-06 – 2014-11-07 (×2): 1 [IU] via SUBCUTANEOUS
  Administered 2014-11-07: 7 [IU] via SUBCUTANEOUS

## 2014-10-31 MED ORDER — HYDROMORPHONE HCL 1 MG/ML IJ SOLN
1.0000 mg | Freq: Once | INTRAMUSCULAR | Status: AC
  Administered 2014-10-31: 1 mg via INTRAVENOUS
  Filled 2014-10-31: qty 1

## 2014-10-31 MED ORDER — HYDROMORPHONE HCL 1 MG/ML IJ SOLN
0.5000 mg | INTRAMUSCULAR | Status: DC | PRN
  Administered 2014-11-01: 0.5 mg via INTRAVENOUS
  Filled 2014-10-31: qty 1

## 2014-10-31 MED ORDER — HEPARIN (PORCINE) IN NACL 100-0.45 UNIT/ML-% IJ SOLN
1000.0000 [IU]/h | INTRAMUSCULAR | Status: DC
  Administered 2014-10-31 – 2014-11-02 (×2): 750 [IU]/h via INTRAVENOUS
  Administered 2014-11-03: 1050 [IU]/h via INTRAVENOUS
  Administered 2014-11-04 – 2014-11-05 (×2): 1000 [IU]/h via INTRAVENOUS
  Filled 2014-10-31 (×7): qty 250

## 2014-10-31 NOTE — Evaluation (Signed)
Physical Therapy Evaluation Patient Details Name: Carmen [name] MRN: [id number] DOB: November 24, 1933 Today's Date: 10/31/2014   History of Present Illness  Pt is a 81 year old female admitted for confusion and CKD. PMH:  CVA with Rt UE hemiparesis, CHF, DM, HTN, CAD, back surgery, PVD, pacemaker, PVD, claudication  Clinical Impression  Pt admitted with above diagnosis. Pt currently with functional limitations due to the deficits listed below (see PT Problem List).  Pt will benefit from skilled PT to increase their independence and safety with mobility to allow discharge to the venue listed below.  Pt having increased difficulty ambulating and requiring assist for balance.  Recommending SNF as pt only has spouse at home and he would likely not be able to provide assist level; pt also presents as HIGH fall risk.  Pt was independent with mobility prior to this admission.     Follow Up Recommendations SNF;Supervision/Assistance - 24 hour    Equipment Recommendations  None recommended by PT    Recommendations for Other Services       Precautions / Restrictions Precautions Precautions: Fall      Mobility  Bed Mobility Overal bed mobility: Needs Assistance Bed Mobility: Supine to Sit;Sit to Supine     Supine to sit: Supervision;HOB elevated Sit to supine: Supervision;HOB elevated   General bed mobility comments: increased time and effort however did not require physical assist  Transfers Overall transfer level: Needs assistance Equipment used: Rolling walker (2 wheeled) Transfers: Sit to/from Stand Sit to Stand: Mod assist         General transfer comment: verbal cues for safe technique, assist to rise and steady, presents with posterior lean  Ambulation/Gait Ambulation/Gait assistance: Mod assist Ambulation Distance (Feet): 4 Feet Assistive device: Rolling walker (2 wheeled) Gait Pattern/deviations: Step-to pattern;Antalgic     General Gait Details: verbal and visual cues  for technique, pt with difficulty following cues for WBing through UEs and taking steps, performed 4 feet forwards and then backwards to bed with LOB requiring assist to prevent fall, tends to keep weight shifted posteriorly despite frequent cues to correct, may potentially WB through UEs better with R platform?  Stairs            Wheelchair Mobility    Modified Rankin (Stroke Patients Only)       Balance Overall balance assessment: Needs assistance         Standing balance support: Bilateral upper extremity supported;During functional activity Standing balance-Leahy Scale: Zero Standing balance comment: posterior LOB with stepping backwards                             Pertinent Vitals/Pain Pain Assessment: Faces Faces Pain Scale: Hurts even more Pain Location: L LE, foot Pain Descriptors / Indicators: Aching Pain Intervention(s): Limited activity within patient's tolerance;Monitored during session;Repositioned    Home Living Family/patient expects to be discharged to:: Private residence Living Arrangements: Spouse/significant other;Children (son works during day) Available Help at Discharge: Family;Available 24 hours/day Type of Home: House       Home Layout: One level Home Equipment: [place] - 2 wheels;Cane - single point;Bedside commode      Prior Function Level of Independence: Independent         Comments: independent with mobility, reports she has aide for household chores     Hand Dominance        Extremity/Trunk Assessment   Upper Extremity Assessment: Generalized weakness;RUE deficits/detail RUE Deficits / Details:  residual deficits observed from previous CVA         Lower Extremity Assessment: Generalized weakness         Communication   Communication: No difficulties  Cognition Arousal/Alertness: Awake/alert Behavior During Therapy: WFL for tasks assessed/performed Overall Cognitive Status: Within Functional Limits for  tasks assessed                      General Comments      Exercises        Assessment/Plan    PT Assessment Patient needs continued PT services  PT Diagnosis Acute pain;Abnormality of gait   PT Problem List Decreased strength;Decreased activity tolerance;Decreased mobility;Decreased balance;Decreased knowledge of use of DME;Pain  PT Treatment Interventions DME instruction;Gait training;Patient/family education;Functional mobility training;Therapeutic activities;Therapeutic exercise;Balance training   PT Goals (Current goals can be found in the Care Plan section) Acute Rehab PT Goals PT Goal Formulation: With patient/family Time For Goal Achievement: 11/07/14 Potential to Achieve Goals: Good    Frequency Min 3X/week   Barriers to discharge        Co-evaluation               End of Session Equipment Utilized During Treatment: Gait belt Activity Tolerance: Patient tolerated treatment well Patient left: in bed;with call bell/phone within reach;with family/visitor present;with bed alarm set           Time: 5750-5183 PT Time Calculation (min) (ACUTE ONLY): 22 min   Charges:   PT Evaluation $Initial PT Evaluation Tier I: 1 Procedure     PT G Codes:        Brynn Mulgrew,KATHrine E 10/31/2014, 10:45 AM Carmelia Bake, PT, DPT 10/31/2014 Pager: [phone]

## 2014-10-31 NOTE — Consult Note (Signed)
CARDIOLOGY CONSULT NOTE  Patient ID: Carmen [name] MRN: [id number] DOB/AGE: 1934-02-05 81 y.o.  Admit date: 10/28/2014 Referring Physician  Vernell Leep, MD Primary Physician:  Maximino Greenland, MD Reason for Consultation  PAD  HPI: Carmen [name]  is a AA  81 y.o. female   with severe peripheral arterial disease, and history of sick sinus syndrome, difficult to control hypertension, was recently seen in the emergency room about 3-4 days ago with left leg pain but felt not to be in acute limb ischemia, foot was warm, was discharged with outpatient follow-up recommendation. She was readmitted to [facility] 2 days ago with altered mental status. She continued to complain of left leg pain, was consulted to evaluate and give my opinion. I did obtain lower except arterial duplex which was abnormal. Plans are to proceed and place her in a extended care facility for a short time for recovery. Patient's family at the bedside. She states that she is unable to keep the feet down without having severe pain. She also cannot touch her left foot without having pain.  Past Medical History  Diagnosis Date  . Hypertension   . Coronary artery disease   . Renal disorder   . Herniated lumbar intervertebral disc   . Cataract     "both eyes; blood pressure's always too high to have them fixed" (09/11/2014)  . Renovascular hypertension      s/p left renal artery stent 12/2007.  S/P balloon angioplasty on 02/16/10 for ISR, BP was  controlled well since then.     . Claudication in peripheral vascular disease     02/16/10: Left CIA 9.0x28 Omnilink and REIA 8.0x40 seff expanding Zilver.   right subclavian artery stent 03/18/2008,  . Peripheral vascular disease   . Anxiety   . Pacemaker   . Pneumonia X 2  . IDDM (insulin dependent diabetes mellitus)   . Migraine     "used to have terrible migraines; stopped in the 1990's"  . Stroke 1999    Stroke and TIA in 1999 with right sided weakness, now with  residual right arm weakness (09/11/2014)  . Arthritis     "everywhere"  . Chronic lower back pain      Past Surgical History  Procedure Laterality Date  . Appendectomy    . Ureteral stent placement    . Carotid endarterectomy Left 1991     Stroke and TIA in 1999 with right sided weakness, now with residual right arm weakness  . Lumbar laminectomy      'herniated disc"  . Back surgery    . Colonoscopy  07/30/2011    Procedure: COLONOSCOPY;  Surgeon: Inda Castle, MD;  Location: [place];  Service: Endoscopy;  Laterality: N/A;  gi bleed  . Esophagogastroduodenoscopy  07/30/2011    Procedure: ESOPHAGOGASTRODUODENOSCOPY (EGD);  Surgeon: Inda Castle, MD;  Location: [place];  Service: Endoscopy;  Laterality: N/A;  . Permanent pacemaker insertion Left 06/28/2011    Procedure: PERMANENT PACEMAKER INSERTION;  Surgeon: Deboraha Sprang, MD;  Location: [facility] CATH LAB;  Service: Cardiovascular;  Laterality: Left;  . Abdominal angiogram N/A 07/06/2011    Procedure: ABDOMINAL ANGIOGRAM;  Surgeon: Laverda Page, MD;  Location: [facility] CATH LAB;  Service: Cardiovascular;  Laterality: N/A;  . Renal angiogram N/A 07/06/2011    Procedure: RENAL ANGIOGRAM;  Surgeon: Laverda Page, MD;  Location: [facility] CATH LAB;  Service: Cardiovascular;  Laterality: N/A;  . Lower extremity angiogram Right 05/29/2012    Procedure:  LOWER EXTREMITY ANGIOGRAM;  Surgeon: Laverda Page, MD;  Location: [facility] CATH LAB;  Service: Cardiovascular;  Laterality: Right;  . Tonsillectomy    . Insert / replace / remove pacemaker    . Excisional hemorrhoidectomy    . Carpal tunnel release Left   . Vaginal hysterectomy       Family History  Problem Relation Age of Onset  . Cancer Father   . Heart attack Mother      Social History: Social History   Social History  . Marital Status: Married    Spouse Name: N/A  . Number of Children: N/A  . Years of Education: N/A   Occupational History  . Not on file.   Social  History Main Topics  . Smoking status: Current Every Day Smoker -- 0.00 packs/day for 70 years    Types: Cigarettes  . Smokeless tobacco: Never Used     Comment: 09/11/2014 "I used to smoke very heavy; down to 1 cigarette/day"  . Alcohol Use: Yes     Comment: "stopped drinking back in the 1970's"  . Drug Use: No  . Sexual Activity: No   Other Topics Concern  . Not on file   Social History Narrative     Prescriptions prior to admission  Medication Sig Dispense Refill Last Dose  . aspirin EC 81 MG tablet Take 81 mg by mouth daily.   10/27/2014 at Unknown time  . benazepril (LOTENSIN) 40 MG tablet Take 40 mg by mouth daily.   10/27/2014 at Unknown time  . clopidogrel (PLAVIX) 75 MG tablet Take 75 mg by mouth daily.   10/27/2014 at Unknown time  . diazepam (VALIUM) 5 MG tablet Take 1 tablet (5 mg total) by mouth 2 (two) times daily. 15 tablet 0 10/28/2014 at Unknown time  . furosemide (LASIX) 20 MG tablet Take 2 tablets (40 mg total) by mouth daily. (Patient taking differently: Take 20 mg by mouth 2 (two) times daily. ) 30 tablet 3 10/27/2014 at Unknown time  . hydrALAZINE (APRESOLINE) 100 MG tablet Take 1 tablet (100 mg total) by mouth 3 (three) times daily. (Patient taking differently: Take 100 mg by mouth 4 (four) times daily. ) 120 tablet 4 10/27/2014 at Unknown time  . insulin NPH-regular Human (NOVOLIN 70/30) (70-30) 100 UNIT/ML injection Inject 8 Units into the skin 2 (two) times daily with a meal. 10 mL 11 10/27/2014 at Unknown time  . isosorbide mononitrate (IMDUR) 120 MG 24 hr tablet Take 1 tablet (120 mg total) by mouth daily. 30 tablet 0 10/27/2014 at Unknown time  . lisinopril (ZESTRIL) 10 MG tablet Take 1 tablet (10 mg total) by mouth daily. 30 tablet 0 10/27/2014 at Unknown time  . metoprolol tartrate (LOPRESSOR) 25 MG tablet Take 1 tablet (25 mg total) by mouth 2 (two) times daily. 60 tablet 4 10/27/2014 at 1700  . Omega-3 Fatty Acids (FISH OIL PEARLS) 300 MG CAPS Take 1 tablet by mouth 2  (two) times daily.   10/27/2014 at Unknown time  . oxyCODONE-acetaminophen (PERCOCET/ROXICET) 5-325 MG per tablet Take 2 tablets by mouth every 6 (six) hours as needed for severe pain. 15 tablet 0 10/28/2014 at Unknown time  . potassium chloride SA (K-DUR,KLOR-CON) 10 MEQ tablet Take 1 tablet (10 mEq total) by mouth daily. 30 tablet 1 10/27/2014 at Unknown time  . mupirocin cream (BACTROBAN) 2 % Apply 1 application topically 2 (two) times daily. (Patient not taking: Reported on 09/11/2014) 15 g 0 Not Taking at Unknown time  ROS: General: no fevers/chills/night sweats, has lack of appetite. Eyes:  blurry vision due to cataract, denies diplopia, or amaurosis ENT: no sore throat or hearing loss ResChronic cough, shortness of breath on exertion. No  wheezing, or hemoptysis CV: no edema or palpitations GI: no abdominal pain, nausea, vomiting, diarrhea, or constipation GU: no dysuria, frequency, or hematuria Skin: no rash Neuro: no headache, numbness, tingling, or weakness of extremities, severe pain in the left leg to move.  Heme: no bleeding, DVT, or easy bruising Endo:  uncontrolled diabetes mellitus.   Physical Exam: Blood pressure 149/47, pulse 59, temperature 98.2 F (36.8 C), temperature source Oral, resp. rate 19, height 5\' 3"  (1.6 m), weight 42.4 kg (93 lb 7.6 oz), SpO2 100 %.   General appearance: alert, cooperative, appears older than stated age, cachectic and no distress Lungs: clear to auscultation bilaterally Heart: regular rate and rhythm, S1, S2 normal, no murmur, click, rub or gallop Abdomen: soft, non-tender; bowel sounds normal; no masses,  no organomegaly Extremities: Chronic right upper exudate contracture present. Left approximately normal. Right lower extremity in normal range of movement. Left leg painful to move. Left leg below the knee is colder compared to right. Severe paresthesia on touch present in the left leg. No ulceration. Right lower expertise warm and  normal. Neurologic: Grossly normal Vascular exam: Carotid bounding, soft bruit. Femoral artery: cord like. Normal pulses. Bilateral bruit present. Popliteal pulse feeble right absent left. Pedal pulse absent bilateral. Slow capillary refill left.    Labs:   Lab Results  Component Value Date   WBC 4.3 10/29/2014   HGB 14.0 10/29/2014   HCT 42.4 10/29/2014   MCV 96.4 10/29/2014   PLT 182 10/29/2014    Recent Labs Lab 10/28/14 1649  10/31/14 0517  NA 136  < > 142  K 6.1*  < > 4.2  CL 113*  < > 114*  CO2 14*  < > 21*  BUN 90*  < > 44*  CREATININE 2.23*  < > 1.33*  CALCIUM 9.4  < > 8.5*  PROT 7.1  --   --   BILITOT 0.6  --   --   ALKPHOS 62  --   --   ALT 36  --   --   AST 27  --   --   GLUCOSE 233*  < > 40*  < > = values in this interval not displayed.  Lipid Panel     Component Value Date/Time   CHOL 149 05/29/2012 0657   TRIG 59 05/29/2012 0657   HDL 87 05/29/2012 0657   CHOLHDL 1.7 05/29/2012 0657   VLDL 12 05/29/2012 0657   LDLCALC 50 05/29/2012 0657    BNP (last 3 results)  Recent Labs  09/11/14 1522  BNP 3381.1*    ProBNP (last 3 results) No results for input(s): PROBNP in the last 8760 hours.  HEMOGLOBIN A1C Lab Results  Component Value Date   HGBA1C 8.7* 09/11/2014   MPG 203 09/11/2014    Cardiac Panel (last 3 results)  Recent Labs  09/11/14 1522 10/28/14 1649  CKTOTAL  --  185  TROPONINI 0.04*  --     Lab Results  Component Value Date   CKTOTAL 185 10/28/2014   CKMB 2.7 08/04/2013   TROPONINI 0.04* 09/11/2014     TSH No results for input(s): TSH in the last 8760 hours.   EKG 10/28/2014:: Normal sinus rhythm, LVH with repolarization abnormality.  Vascular Dopplers: 10/31/2014: Right - Moderate irregular calcific and  non calcific plaque noted  throughout the entire lower extremity with areas of acoustic  shadowing.. Doppler waveforms are abnormal. There is no evidence  of significant stenosis, - Left - Moderate to severe  irregular calcific and non calcic  plaque noted throughout the entire lower extremity with areas of  acoustic shadowing. Doppler waveforms are abnormal and begin to  diminish to dampened monophasic with diminishing velocities in  the mid femoral artery continuing throughout the remainder of the  leg. there is no evidence of significant stenosis except in the  distal anterior tibial artery which appears occluded  Echo: 04/06/2013: Hyperdynamic left systolic function. EF 65-70%. Grade 1 diastolic dysfunction. Pacemaker wire noted in the right ventricle. Moderate left enlargement. Mild to moderate pulmonary hypertension, PA pressure 41 mmHg.    Radiology: No results found.  Scheduled Meds: . antiseptic oral rinse  7 mL Mouth Rinse BID  . aspirin EC  81 mg Oral Daily  . clopidogrel  75 mg Oral Daily  . feeding supplement  1 Container Oral BID BM  . hydrALAZINE  100 mg Oral QID  . insulin aspart  0-9 Units Subcutaneous TID WC  . insulin aspart protamine- aspart  5 Units Subcutaneous BID WC  . isosorbide mononitrate  120 mg Oral Daily  . metoprolol tartrate  25 mg Oral BID  . omega-3 acid ethyl esters  1 g Oral BID  . polyethylene glycol  17 g Oral BID  . senna-docusate  2 tablet Oral BID   Continuous Infusions:  PRN Meds:.hydrALAZINE, HYDROcodone-acetaminophen, HYDROmorphone (DILAUDID) injection  ASSESSMENT AND PLAN:  1. Acute limb threatening ischemia left leg, patient has rest pain and cold leg. No ulceration or infarct. 2. Peripheral arterial disease in diabetes mellitus. History of PTA and stenting of the right and left iliac arteries in the past, history of right subclavian artery stent, 2011, 2010 respectively.  History of PTA and stenting of the right SFA on 05/29/2012 with 6.0 x 16 mm Zilver self-expanding stent and chocolate balloon angioplasty of the distal right SFA. 3 vessel r/u bilateral legs. Mild disease left. 3. Diabetes mellitus type 2 uncontrolled 4. History of  tobacco use disorder, quit recently 5. Hypertension 6. Noncompliance with medical advice 7. Acute renal failure now improving serum creatinine, due to dehydration. Patient admitted with altered mental status.  Recommendation: I had a very long discussion with patient's family including her daughter, granddaughter and the patient regarding severe pain that is ongoing in the left leg and the findings of the lower extremity arterial duplex which revealed monophasic waveforms throughout the left lower extremity extending from the left mid SFA. I suspect she has probably occluded the left mid SFA or has a high-grade thrombotic lesion. She is extremely high risk for proceeding with peripheral arteriogram with risk of renal failure, significant risk of bleeding and embolic complication. She has severe peripheral arterial disease and severe calcification or vascular disease.   After discussions, the improved lifestyle and quality, as patient is unable to get any relief of pain, we have agreed to proceed with peripheral arteriogram. I would like her serum creatinine to stabilize, it has been trending down over the past 2 days. Schedule permitting will plan this on Monday or Tuesday. I would like to start her on IV heparin for now. Unless her symptoms get worse, procedure to be performed on Monday or Tuesday.  Adrian Prows, MD 10/31/2014, 8:19 PM [facility] [place] Pager: [phone] Office: [phone] If no answer Cell [phone]

## 2014-10-31 NOTE — Progress Notes (Addendum)
ANTICOAGULATION CONSULT NOTE - Initial Consult  Pharmacy Consult for heparin Indication: limb-threatening ischemia  Allergies  Allergen Reactions  . Peanuts [Peanut Oil] Swelling  . Norvasc [Amlodipine Besylate] Swelling  . Peanut-Containing Drug Products Other (See Comments)    Cause my stomach to hurt    Patient Measurements: Height: 5\' 3"  (160 cm) Weight: 93 lb 7.6 oz (42.4 kg) IBW/kg (Calculated) : 52.4 Heparin Dosing Weight: 42.4 kg  Vital Signs: Temp: 98.2 F (36.8 C) (08/25 1330) Temp Source: Oral (08/25 1330) BP: 149/47 mmHg (08/25 1419) Pulse Rate: 59 (08/25 1330)  Labs:  Recent Labs  10/29/14 0434 10/30/14 0511 10/30/14 1655 10/31/14 0517  HGB 14.0  --   --   --   HCT 42.4  --   --   --   PLT 182  --   --   --   CREATININE 2.12* 1.70* 1.59* 1.33*    Estimated Creatinine Clearance: 23.3 mL/min (by C-G formula based on Cr of 1.33).   Medical History: Past Medical History  Diagnosis Date  . Hypertension   . Coronary artery disease   . Renal disorder   . Herniated lumbar intervertebral disc   . Cataract     "both eyes; blood pressure's always too high to have them fixed" (09/11/2014)  . Renovascular hypertension      s/p left renal artery stent 12/2007.  S/P balloon angioplasty on 02/16/10 for ISR, BP was  controlled well since then.     . Claudication in peripheral vascular disease     02/16/10: Left CIA 9.0x28 Omnilink and REIA 8.0x40 seff expanding Zilver.   right subclavian artery stent 03/18/2008,  . Peripheral vascular disease   . Anxiety   . Pacemaker   . Pneumonia X 2  . IDDM (insulin dependent diabetes mellitus)   . Migraine     "used to have terrible migraines; stopped in the 1990's"  . Stroke 1999    Stroke and TIA in 1999 with right sided weakness, now with residual right arm weakness (09/11/2014)  . Arthritis     "everywhere"  . Chronic lower back pain     Medications:  Prescriptions prior to admission  Medication Sig Dispense  Refill Last Dose  . aspirin EC 81 MG tablet Take 81 mg by mouth daily.   10/27/2014 at Unknown time  . benazepril (LOTENSIN) 40 MG tablet Take 40 mg by mouth daily.   10/27/2014 at Unknown time  . clopidogrel (PLAVIX) 75 MG tablet Take 75 mg by mouth daily.   10/27/2014 at Unknown time  . diazepam (VALIUM) 5 MG tablet Take 1 tablet (5 mg total) by mouth 2 (two) times daily. 15 tablet 0 10/28/2014 at Unknown time  . furosemide (LASIX) 20 MG tablet Take 2 tablets (40 mg total) by mouth daily. (Patient taking differently: Take 20 mg by mouth 2 (two) times daily. ) 30 tablet 3 10/27/2014 at Unknown time  . hydrALAZINE (APRESOLINE) 100 MG tablet Take 1 tablet (100 mg total) by mouth 3 (three) times daily. (Patient taking differently: Take 100 mg by mouth 4 (four) times daily. ) 120 tablet 4 10/27/2014 at Unknown time  . insulin NPH-regular Human (NOVOLIN 70/30) (70-30) 100 UNIT/ML injection Inject 8 Units into the skin 2 (two) times daily with a meal. 10 mL 11 10/27/2014 at Unknown time  . isosorbide mononitrate (IMDUR) 120 MG 24 hr tablet Take 1 tablet (120 mg total) by mouth daily. 30 tablet 0 10/27/2014 at Unknown time  . lisinopril (ZESTRIL)  10 MG tablet Take 1 tablet (10 mg total) by mouth daily. 30 tablet 0 10/27/2014 at Unknown time  . metoprolol tartrate (LOPRESSOR) 25 MG tablet Take 1 tablet (25 mg total) by mouth 2 (two) times daily. 60 tablet 4 10/27/2014 at 1700  . Omega-3 Fatty Acids (FISH OIL PEARLS) 300 MG CAPS Take 1 tablet by mouth 2 (two) times daily.   10/27/2014 at Unknown time  . oxyCODONE-acetaminophen (PERCOCET/ROXICET) 5-325 MG per tablet Take 2 tablets by mouth every 6 (six) hours as needed for severe pain. 15 tablet 0 10/28/2014 at Unknown time  . potassium chloride SA (K-DUR,KLOR-CON) 10 MEQ tablet Take 1 tablet (10 mEq total) by mouth daily. 30 tablet 1 10/27/2014 at Unknown time  . mupirocin cream (BACTROBAN) 2 % Apply 1 application topically 2 (two) times daily. (Patient not taking:  Reported on 09/11/2014) 15 g 0 Not Taking at Unknown time   Scheduled:  . antiseptic oral rinse  7 mL Mouth Rinse BID  . aspirin EC  81 mg Oral Daily  . clopidogrel  75 mg Oral Daily  . feeding supplement  1 Container Oral BID BM  . heparin  1,500 Units Intravenous Once  . hydrALAZINE  100 mg Oral QID  . insulin aspart  0-9 Units Subcutaneous TID WC  . insulin aspart protamine- aspart  5 Units Subcutaneous BID WC  . isosorbide mononitrate  120 mg Oral Daily  . metoprolol tartrate  25 mg Oral BID  . omega-3 acid ethyl esters  1 g Oral BID  . polyethylene glycol  17 g Oral BID  . senna-docusate  2 tablet Oral BID    Assessment: 89 yoF with Hx HTN, DMT2, CVA, CAD, PAD with intermittent claudication x 3-4 months, now worsening in the past week with cold left foot.  Seen by cardiology and found to have moderate to severe plaque throughout LLE.  Due to extensive comorbidities, patient is high risk for renal failure and bleeding from arteriogram.  Plan is to place patient on heparin until SCr stabilizes, then proceed with arteriogram probably on 8/29 or 8/30   Baseline INR, aPTT: pending  Prior anticoagulation: on ASA/Plavix prior to admit  Significant events:  Today, 10/31/2014:  CBC: (8/23) Hgb/Plt both wnl  CrCl: 23.3 due mostly to low weight and advanced age  Goal of Therapy: Heparin level 0.3-0.7 units/ml Monitor platelets by anticoagulation protocol: Yes  Plan: Heparin per [place] nomogram:  Heparin 1500 units IV bolus x 1  Heparin 750 units/hr IV infusion  Check heparin level 8 hrs after start  F/u baseline coag labs  Daily CBC, daily heparin level once stable  Monitor for signs of bleeding or thrombosis.  Patient is at high risk of bleeding with concomitant ASA/Plavix use.  Recommend using heparin for the shortest possible duration.   Reuel Boom, PharmD Pager: [phone] 10/31/2014, 8:30 PM

## 2014-10-31 NOTE — Clinical Social Work Placement (Signed)
Patient has a bed at [facility] when stable for discharge.     Raynaldo Opitz, [facility] Clinical Social Worker cell #: [phone]    CLINICAL SOCIAL WORK PLACEMENT  NOTE  Date:  10/31/2014  Patient Details  Name: Carmen [name] MRN: [id number] Date of Birth: [birth year]  Clinical Social Work is seeking post-discharge placement for this patient at the [place] level of care (*CSW will initial, date and re-position this form in  chart as items are completed):  Yes   Patient/family provided with [place] Work Department's list of facilities offering this level of care within the geographic area requested by the patient (or if unable, by the patient's family).  Yes   Patient/family informed of their freedom to choose among providers that offer the needed level of care, that participate in Medicare, Medicaid or managed care program needed by the patient, have an available bed and are willing to accept the patient.  Yes   Patient/family informed of [facility]'s ownership interest in [facility] and [facility], as well as of the fact that they are under no obligation to receive care at these facilities.  PASRR submitted to EDS on 10/31/14     PASRR number received on 10/31/14     Existing PASRR number confirmed on       FL2 transmitted to all facilities in geographic area requested by pt/family on 10/31/14     FL2 transmitted to all facilities within larger geographic area on       Patient informed that his/her managed care company has contracts with or will negotiate with certain facilities, including the following:        Yes   Patient/family informed of bed offers received.  Patient chooses bed at [facility] recommends and patient chooses bed at      Patient to be transferred to [facility] on  .  Patient to be transferred to facility by       Patient  family notified on   of transfer.  Name of family member notified:        PHYSICIAN       Additional Comment:    _______________________________________________ Standley Brooking, LCSW 10/31/2014, 11:56 AM

## 2014-10-31 NOTE — Progress Notes (Signed)
VASCULAR LAB PRELIMINARY  PRELIMINARY  PRELIMINARY  PRELIMINARY  Bilateral lower extremity arterial duplex completed.    Preliminary report:  Duplex scan of the lower extremity arterial system reveal moderate calcific and non calcific plaque throughout the right and moderate to severe calcific and non calcific plaque  Through out the left. Doppler waveforms on the right were biphasic in the common femoral artery diminishing to dampened monophasic in the distal tibial arteries. The Doppler waveforms on the left were biphasic in the proximal common femoral artery diminishing to monophasic into the popliteal. The Doppler waveforms throughout the tibial arteries diminished to dampened monophasic with  severelydiminishing velocities in the distal region. There is no evidence of a total occlusion.  Carmen [name], RVS 10/31/2014, 10:43 AM

## 2014-10-31 NOTE — Progress Notes (Signed)
Hypoglycemic Event  CBG: 41  Treatment: 15 GM carbohydrate snack  Symptoms: None  Follow-up CBG: Time:0711 CBG Result:75  Possible Reasons for Event: Unknown  Comments/MD notified:Dr. Hongalgi    Carmen [name], Carmen [name]  Remember to initiate Hypoglycemia Order Set & complete

## 2014-10-31 NOTE — Progress Notes (Signed)
PROGRESS NOTE    Carmen [name] IYM:[id number] DOB: 04-01-1933 DOA: 10/28/2014 PCP: Maximino [place], MD  HPI/Brief narrative 81 year old female with history of HTN, IDDM/DM 2, CVA with right upper extremity contracture, CKD3, chronic low back pain, CAD, PAD, PPM, history of intermittent left leg pain for the last 3-4 months which has progressively worsened in the last week, seen at [facility] ED on 8/21 at which time assessed to have radiculopathy related pain-EDP discussed with Dr. Einar Gip stated that she has chronically cool left extremity without palpable or dopplerable distal pulses. She was given pain medications and discharged home but returned and admitted to the [facility] on 10/28/14 due to confusion and persistent left leg pain. She was found with acute renal failure, hyperkalemia and hematuria.   Assessment/Plan:  Acute on stage III chronic kidney disease - Baseline creatinine probably in the 1.2-1.5 range. Admitted with creatinine of 2.3. - Most likely related to poor oral intake and dehydration. - No hydronephrosis by CT renal stone study. - Hydrated with IV fluids, creatinine now at baseline.  - Acute kidney injury resolved.  Hyperkalemia - Secondary to acute on chronic kidney disease. Also on ACEI/lisinopril and potassium supplements at home-held. - Provided a dose of Kayexalate 8/24.  - ? RTA 4 also a possibility. - DC heparin which can at times cause hyperkalemia. - Resolved.  Left leg pain  - No reported trauma. Picture not consistent with peripheral neuropathy or radiculopathy pain. X-ray negative for fractures. Lower extremity venous Dopplers negative. ABI pending.  - ? Worsening PAD. Discussed with Dr. Einar Gip 8/24> recommended obtaining complete bilateral lower extremity arterial Dopplers and decision pending results and renal functions. - Bilateral lower extremity arterial Doppler results as below-discussed with Dr. Einar Gip who will formally consult and provide  recommendations.  Hematuria  - Microscopic hematuria. CT renal stone study: No stone. Started empirically on IV Rocephin for possible UTI-urine culture negative. Will DC IV Rocephin. Consider outpatient consultation with urology.   Acute encephalopathy - Secondary to metabolic derangement (acute renal failure) and opioids. Resolved as per discussion with granddaughter at bedside. - CT head without acute findings.  Uncontrolled type II DM/IDDM/hypoglycemia - On home dose of 70/30 insulin and SSI added. Hypoglycemic with CBG of 41 this morning. Reduced 70/30 insulin to 5 units twice a day. Monitor closely.  Essential hypertension - Mildly uncontrolled. Continue metoprolol, Imdur and hydralazine. Holding ACEI secondary to acute renal failure.  PAD status post bilateral iliac stenting  CVA with residual R UE contracture - Continue aspirin and Plavix  Failure to thrive  Fecal impaction - Started bowel regimen. As per patient and family, had a BM last night.  S/P PPM   DVT prophylaxis: SCDs  Code Status: Full Family Communication: Discussed with second granddaughter at bedside.  Disposition Plan: DC to SNF when bed available-possibly 8/26 if no intervention planned for PAD.   Consultants:  None  Procedures:  Bilateral lower extremity arterial duplex 10/31/14.   Preliminary report: Duplex scan of the lower extremity arterial system reveal moderate calcific and non calcific plaque throughout the right and moderate to severe calcific and non calcific plaque Through out the left. Doppler waveforms on the right were biphasic in the common femoral artery diminishing to dampened monophasic in the distal tibial arteries. The Doppler waveforms on the left were biphasic in the proximal common femoral artery diminishing to monophasic into the popliteal. The Doppler waveforms throughout the tibial arteries diminished to dampened monophasic with severelydiminishing velocities in the distal  region. There is no evidence of a total occlusion.  Antibiotics:  Rocephin 8/22 > 8/24  Subjective: Head BM last night. Left leg pain with movement.  Objective: Filed Vitals:   10/30/14 2111 10/31/14 0857 10/31/14 0911 10/31/14 1330  BP: 125/41 171/57  164/56  Pulse: 60 70 70 59  Temp: 97.9 F (36.6 C)   98.2 F (36.8 C)  TempSrc: Oral   Oral  Resp: 20   19  Height:      Weight:      SpO2: 98% 98%  100%    Intake/Output Summary (Last 24 hours) at 10/31/14 1347 Last data filed at 10/31/14 0830  Gross per 24 hour  Intake   3200 ml  Output    900 ml  Net   2300 ml   Filed Weights   10/28/14 1606 10/28/14 1632  Weight: 44.906 kg (99 lb) 42.4 kg (93 lb 7.6 oz)     Exam:  General exam: Pleasant elderly, frail female sitting up in bed comfortably. Respiratory system: Clear. No increased work of breathing. Cardiovascular system: S1 & S2 heard, RRR. No JVD, murmurs, gallops, clicks or pedal edema. Telemetry: Sinus rhythm/on demand A pacing.  Gastrointestinal system: Abdomen is nondistended, soft and nontender. Normal bowel sounds heard. Central nervous system: Alert and oriented. No focal neurological deficits. Extremities: Symmetric 5 x 5 power. Left leg below knee cool to touch compared to right, absent left dorsalis pedis and posterior tibial pulses.? Febrile left popliteal pulse. Left femoral pulse well palpable.   Data Reviewed: Basic Metabolic Panel:  Recent Labs Lab 10/28/14 1649 10/29/14 0434 10/30/14 0511 10/30/14 1655 10/31/14 0517  NA 136 143 138 136 142  K 6.1* 5.5* 5.5* 4.4 4.2  CL 113* 114* 110 110 114*  CO2 14* 19* 18* 20* 21*  GLUCOSE 233* 135* 126* 231* 40*  BUN 90* 86* 65* 53* 44*  CREATININE 2.23* 2.12* 1.70* 1.59* 1.33*  CALCIUM 9.4 9.2 8.9 8.4* 8.5*   Liver Function Tests:  Recent Labs Lab 10/28/14 1649  AST 27  ALT 36  ALKPHOS 62  BILITOT 0.6  PROT 7.1  ALBUMIN 3.9   No results for input(s): LIPASE, AMYLASE in the last 168  hours. No results for input(s): AMMONIA in the last 168 hours. CBC:  Recent Labs Lab 10/28/14 1245 10/29/14 0434  WBC 5.9 4.3  NEUTROABS 4.6  --   HGB 15.0 14.0  HCT 46.3* 42.4  MCV 98.3 96.4  PLT 180 182   Cardiac Enzymes:  Recent Labs Lab 10/28/14 1649  CKTOTAL 185   BNP (last 3 results) No results for input(s): PROBNP in the last 8760 hours. CBG:  Recent Labs Lab 10/30/14 2054 10/31/14 0655 10/31/14 0711 10/31/14 0801 10/31/14 1154  GLUCAP 114* 41* 75 124* 179*    Recent Results (from the past 240 hour(s))  Urine culture     Status: None   Collection Time: 10/28/14  1:56 PM  Result Value Ref Range Status   Specimen Description URINE, CATHETERIZED  Final   Special Requests NONE  Final   Culture   Final    NO GROWTH 1 DAY Performed at [facility]    Report Status 10/30/2014 FINAL  Final      Studies: Dg Tibia/fibula Left Port  10/29/2014   CLINICAL DATA:  Patient with generalized lower extremity pain. No reported injury. Initial encounter.  EXAM: PORTABLE LEFT TIBIA AND FIBULA - 2 VIEW  COMPARISON:  None.  FINDINGS: Normal anatomic alignment. No evidence  for acute fracture or dislocation. Regional soft tissues demonstrate vascular calcifications. Midfoot degenerative changes.  IMPRESSION: No acute osseous abnormality.   Electronically Signed   By: Lovey Newcomer M.D.   On: 10/29/2014 16:05        Scheduled Meds: . antiseptic oral rinse  7 mL Mouth Rinse BID  . aspirin EC  81 mg Oral Daily  . clopidogrel  75 mg Oral Daily  . feeding supplement  1 Container Oral BID BM  . hydrALAZINE  100 mg Oral QID  . insulin aspart  0-9 Units Subcutaneous TID WC  . insulin aspart protamine- aspart  5 Units Subcutaneous BID WC  . isosorbide mononitrate  120 mg Oral Daily  . metoprolol tartrate  25 mg Oral BID  . omega-3 acid ethyl esters  1 g Oral BID  . polyethylene glycol  17 g Oral BID  . senna-docusate  2 tablet Oral BID   Continuous Infusions:     Active Problems:   Altered mental status   Confusion    Time spent: 40 minutes.    Vernell Leep, MD, FACP, FHM. [facility] Pager [phone]  If 7PM-7AM, please contact night-coverage [email or web address] Password [email or web address] 10/31/2014, 1:47 PM    LOS: 3 days

## 2014-10-31 NOTE — Clinical Social Work Note (Signed)
Clinical Social Work Assessment  Patient Details  Name: Carmen [name] MRN: [id number] Date of Birth: Jun 27, 1933  Date of referral:  10/31/14               Reason for consult:  Facility Placement                Permission sought to share information with:  Chartered certified accountant granted to share information::  Yes, Verbal Permission Granted  Name::        Agency::     Relationship::     Contact Information:     Housing/Transportation Living arrangements for the past 2 months:  Single Family Home Source of Information:  Patient, Adult Children Patient Interpreter Needed:  None Criminal Activity/Legal Involvement Pertinent to Current Situation/Hospitalization:  No - Comment as needed Significant Relationships:  Adult Children Lives with:  Self Do you feel safe going back to the place where you live?  Yes Need for family participation in patient care:  No (Coment)  Care giving concerns:  CSW reviewed PT evaluation recommending SNF at discharge.    Social Worker assessment / plan:  CSW spoke with patient & grandaughter, Carmen [name] at bedside re: SNF. Patient informed CSW that she had been to [facility] in the past & would prefer to go there.   Employment status:  Retired Nurse, adult PT Recommendations:  [place] / Referral to community resources:  [place]  Patient/Family's Response to care:  CSW confirmed with Carmen [name] at [facility] that they would be able to take patient when ready - patient & grandaughter pleased to hear.   Patient/Family's Understanding of and Emotional Response to Diagnosis, Current Treatment, and Prognosis:  Patient is awaiting sonogram, patient is anticipating possible discharge this afternoon pending sonogram results.   Emotional Assessment Appearance:  Appears stated age Attitude/Demeanor/Rapport:    Affect (typically observed):  Pleasant, Quiet, Calm Orientation:   Oriented to Self, Oriented to Place, Oriented to  Time, Oriented to Situation Alcohol / Substance use:    Psych involvement (Current and /or in the community):     Discharge Needs  Concerns to be addressed:    Readmission within the last 30 days:    Current discharge risk:    Barriers to Discharge:      Standley Brooking, LCSW 10/31/2014, 11:54 AM

## 2014-10-31 NOTE — Plan of Care (Deleted)
Problem: Phase II Progression Outcomes Goal: Vital signs remain stable Outcome: Progressing Afebrile, VS WNLs

## 2014-10-31 NOTE — Progress Notes (Signed)
OT Cancellation Note  Patient Details Name: ANNAKA CLEAVER MRN: [id number] DOB: 06-Apr-1933   Cancelled Treatment:    Reason Eval/Treat Not Completed: Fatigue/lethargy limiting ability to participate.  Will check back tomorrow.  Reeder Brisby 10/31/2014, 3:12 PM  Lesle Chris, OTR/L [phone] 10/31/2014

## 2014-11-01 DIAGNOSIS — E1140 Type 2 diabetes mellitus with diabetic neuropathy, unspecified: Secondary | ICD-10-CM

## 2014-11-01 DIAGNOSIS — I10 Essential (primary) hypertension: Secondary | ICD-10-CM

## 2014-11-01 LAB — URINALYSIS, ROUTINE W REFLEX MICROSCOPIC
Bilirubin Urine: NEGATIVE
GLUCOSE, UA: NEGATIVE mg/dL
HGB URINE DIPSTICK: NEGATIVE
KETONES UR: NEGATIVE mg/dL
LEUKOCYTES UA: NEGATIVE
Nitrite: NEGATIVE
PH: 5.5 (ref 5.0–8.0)
Protein, ur: 100 mg/dL — AB
Specific Gravity, Urine: 1.015 (ref 1.005–1.030)
Urobilinogen, UA: 0.2 mg/dL (ref 0.0–1.0)

## 2014-11-01 LAB — BASIC METABOLIC PANEL
Anion gap: 7 (ref 5–15)
BUN: 33 mg/dL — AB (ref 6–20)
CALCIUM: 8.5 mg/dL — AB (ref 8.9–10.3)
CO2: 18 mmol/L — ABNORMAL LOW (ref 22–32)
CREATININE: 1.41 mg/dL — AB (ref 0.44–1.00)
Chloride: 113 mmol/L — ABNORMAL HIGH (ref 101–111)
GFR calc Af Amer: 40 mL/min — ABNORMAL LOW (ref >60)
GFR, EST NON AFRICAN AMERICAN: 35 mL/min — AB (ref >60)
GLUCOSE: 269 mg/dL — AB (ref 65–99)
POTASSIUM: 5 mmol/L (ref 3.5–5.1)
SODIUM: 138 mmol/L (ref 135–145)

## 2014-11-01 LAB — CBC
HCT: 36.7 % (ref 36.0–46.0)
Hemoglobin: 12 g/dL (ref 12.0–15.0)
MCH: 32.3 pg (ref 26.0–34.0)
MCHC: 32.7 g/dL (ref 30.0–36.0)
MCV: 98.7 fL (ref 78.0–100.0)
Platelets: 149 10*3/uL — ABNORMAL LOW (ref 150–400)
RBC: 3.72 MIL/uL — ABNORMAL LOW (ref 3.87–5.11)
RDW: 14.8 % (ref 11.5–15.5)
WBC: 5.4 10*3/uL (ref 4.0–10.5)

## 2014-11-01 LAB — GLUCOSE, CAPILLARY
GLUCOSE-CAPILLARY: 132 mg/dL — AB (ref 65–99)
GLUCOSE-CAPILLARY: 92 mg/dL (ref 65–99)
Glucose-Capillary: 167 mg/dL — ABNORMAL HIGH (ref 65–99)
Glucose-Capillary: 174 mg/dL — ABNORMAL HIGH (ref 65–99)

## 2014-11-01 LAB — HEPARIN LEVEL (UNFRACTIONATED)
HEPARIN UNFRACTIONATED: 0.32 [IU]/mL (ref 0.30–0.70)
HEPARIN UNFRACTIONATED: 0.36 [IU]/mL (ref 0.30–0.70)

## 2014-11-01 LAB — URINE MICROSCOPIC-ADD ON

## 2014-11-01 MED ORDER — HYDRALAZINE HCL 20 MG/ML IJ SOLN
10.0000 mg | Freq: Four times a day (QID) | INTRAMUSCULAR | Status: DC | PRN
  Administered 2014-11-01 – 2014-11-06 (×6): 10 mg via INTRAVENOUS
  Filled 2014-11-01 (×6): qty 1

## 2014-11-01 MED ORDER — SODIUM CHLORIDE 0.9 % IV SOLN: INTRAVENOUS | Status: DC

## 2014-11-01 MED ORDER — OXYCODONE-ACETAMINOPHEN 5-325 MG PO TABS
2.0000 | ORAL_TABLET | Freq: Four times a day (QID) | ORAL | Status: DC | PRN
  Administered 2014-11-01 – 2014-11-07 (×7): 2 via ORAL
  Filled 2014-11-01 (×7): qty 2

## 2014-11-01 MED ORDER — METOPROLOL TARTRATE 25 MG PO TABS
25.0000 mg | ORAL_TABLET | Freq: Once | ORAL | Status: AC
  Administered 2014-11-01: 25 mg via ORAL
  Filled 2014-11-01: qty 1

## 2014-11-01 MED ORDER — METOPROLOL TARTRATE 50 MG PO TABS
50.0000 mg | ORAL_TABLET | Freq: Two times a day (BID) | ORAL | Status: DC
  Administered 2014-11-01: 50 mg via ORAL
  Filled 2014-11-01: qty 1

## 2014-11-01 MED ORDER — HYDROMORPHONE HCL 1 MG/ML IJ SOLN
1.0000 mg | INTRAMUSCULAR | Status: DC | PRN
  Administered 2014-11-01 – 2014-11-05 (×9): 1 mg via INTRAVENOUS
  Filled 2014-11-01 (×10): qty 1

## 2014-11-01 MED ORDER — SODIUM CHLORIDE 0.45 % IV SOLN
INTRAVENOUS | Status: DC
  Administered 2014-11-01 – 2014-11-02 (×2): via INTRAVENOUS

## 2014-11-01 NOTE — Progress Notes (Signed)
Patient was transport by [facility] to [facility]. Patient tolerated the transfer to the stretcher well.

## 2014-11-01 NOTE — Progress Notes (Signed)
Patient had not urinated since early last night. Bladder was scanned and it showed 836 cc. NP was notified and new orders were given to do an in out and out. Cath was done and RN got 825cc

## 2014-11-01 NOTE — Progress Notes (Signed)
ANTICOAGULATION CONSULT NOTE - F/u Consult  Pharmacy Consult for heparin Indication: limb-threatening ischemia  Allergies  Allergen Reactions  . Peanuts [Peanut Oil] Swelling  . Norvasc [Amlodipine Besylate] Swelling  . Peanut-Containing Drug Products Other (See Comments)    Cause my stomach to hurt    Patient Measurements: Height: 5\' 3"  (160 cm) Weight: 93 lb 7.6 oz (42.4 kg) IBW/kg (Calculated) : 52.4 Heparin Dosing Weight: 42.4 kg  Vital Signs: Temp: 98.5 F (36.9 C) (08/26 0523) Temp Source: Oral (08/26 0523) BP: 227/63 mmHg (08/26 0523) Pulse Rate: 67 (08/26 0523)  Labs:  Recent Labs  10/30/14 0511 10/30/14 1655 10/31/14 0517 10/31/14 2118 11/01/14 0429  APTT  --   --   --  29  --   LABPROT  --   --   --  13.0  --   INR  --   --   --  0.96  --   HEPARINUNFRC  --   --   --   --  0.32  CREATININE 1.70* 1.59* 1.33*  --   --     Estimated Creatinine Clearance: 23.3 mL/min (by C-G formula based on Cr of 1.33).   Medical History: Past Medical History  Diagnosis Date  . Hypertension   . Coronary artery disease   . Renal disorder   . Herniated lumbar intervertebral disc   . Cataract     "both eyes; blood pressure's always too high to have them fixed" (09/11/2014)  . Renovascular hypertension      s/p left renal artery stent 12/2007.  S/P balloon angioplasty on 02/16/10 for ISR, BP was  controlled well since then.     . Claudication in peripheral vascular disease     02/16/10: Left CIA 9.0x28 Omnilink and REIA 8.0x40 seff expanding Zilver.   right subclavian artery stent 03/18/2008,  . Peripheral vascular disease   . Anxiety   . Pacemaker   . Pneumonia X 2  . IDDM (insulin dependent diabetes mellitus)   . Migraine     "used to have terrible migraines; stopped in the 1990's"  . Stroke 1999    Stroke and TIA in 1999 with right sided weakness, now with residual right arm weakness (09/11/2014)  . Arthritis     "everywhere"  . Chronic lower back pain      Medications:  Prescriptions prior to admission  Medication Sig Dispense Refill Last Dose  . aspirin EC 81 MG tablet Take 81 mg by mouth daily.   10/27/2014 at Unknown time  . benazepril (LOTENSIN) 40 MG tablet Take 40 mg by mouth daily.   10/27/2014 at Unknown time  . clopidogrel (PLAVIX) 75 MG tablet Take 75 mg by mouth daily.   10/27/2014 at Unknown time  . diazepam (VALIUM) 5 MG tablet Take 1 tablet (5 mg total) by mouth 2 (two) times daily. 15 tablet 0 10/28/2014 at Unknown time  . furosemide (LASIX) 20 MG tablet Take 2 tablets (40 mg total) by mouth daily. (Patient taking differently: Take 20 mg by mouth 2 (two) times daily. ) 30 tablet 3 10/27/2014 at Unknown time  . hydrALAZINE (APRESOLINE) 100 MG tablet Take 1 tablet (100 mg total) by mouth 3 (three) times daily. (Patient taking differently: Take 100 mg by mouth 4 (four) times daily. ) 120 tablet 4 10/27/2014 at Unknown time  . insulin NPH-regular Human (NOVOLIN 70/30) (70-30) 100 UNIT/ML injection Inject 8 Units into the skin 2 (two) times daily with a meal. 10 mL 11 10/27/2014 at Unknown  time  . isosorbide mononitrate (IMDUR) 120 MG 24 hr tablet Take 1 tablet (120 mg total) by mouth daily. 30 tablet 0 10/27/2014 at Unknown time  . lisinopril (ZESTRIL) 10 MG tablet Take 1 tablet (10 mg total) by mouth daily. 30 tablet 0 10/27/2014 at Unknown time  . metoprolol tartrate (LOPRESSOR) 25 MG tablet Take 1 tablet (25 mg total) by mouth 2 (two) times daily. 60 tablet 4 10/27/2014 at 1700  . Omega-3 Fatty Acids (FISH OIL PEARLS) 300 MG CAPS Take 1 tablet by mouth 2 (two) times daily.   10/27/2014 at Unknown time  . oxyCODONE-acetaminophen (PERCOCET/ROXICET) 5-325 MG per tablet Take 2 tablets by mouth every 6 (six) hours as needed for severe pain. 15 tablet 0 10/28/2014 at Unknown time  . potassium chloride SA (K-DUR,KLOR-CON) 10 MEQ tablet Take 1 tablet (10 mEq total) by mouth daily. 30 tablet 1 10/27/2014 at Unknown time  . mupirocin cream (BACTROBAN) 2 %  Apply 1 application topically 2 (two) times daily. (Patient not taking: Reported on 09/11/2014) 15 g 0 Not Taking at Unknown time   Scheduled:  . antiseptic oral rinse  7 mL Mouth Rinse BID  . aspirin EC  81 mg Oral Daily  . clopidogrel  75 mg Oral Daily  . feeding supplement  1 Container Oral BID BM  . hydrALAZINE  100 mg Oral QID  . insulin aspart  0-9 Units Subcutaneous TID WC  . insulin aspart protamine- aspart  5 Units Subcutaneous BID WC  . isosorbide mononitrate  120 mg Oral Daily  . metoprolol tartrate  25 mg Oral BID  . omega-3 acid ethyl esters  1 g Oral BID  . polyethylene glycol  17 g Oral BID  . senna-docusate  2 tablet Oral BID    Assessment: 63 yoF with Hx HTN, DMT2, CVA, CAD, PAD with intermittent claudication x 3-4 months, now worsening in the past week with cold left foot.  Seen by cardiology and found to have moderate to severe plaque throughout LLE.  Due to extensive comorbidities, patient is high risk for renal failure and bleeding from arteriogram.  Plan is to place patient on heparin until SCr stabilizes, then proceed with arteriogram probably on 8/29 or 8/30   Baseline INR, aPTT: pending  Prior anticoagulation: on ASA/Plavix prior to admit  Significant events:  10/31/14  CBC: (8/23) Hgb/Plt both wnl  CrCl: 23.3 due mostly to low weight and advanced age Today, 11/01/14  0430 HL=0.32, no problems reported, low end of therapeutic  Goal of Therapy: Heparin level 0.3-0.7 units/ml Monitor platelets by anticoagulation protocol: Yes  Plan:  Continue Heparin drip at 750 units/hr   Recheck HL at 11am today to ensure stays in therapeutic range  Daily CBC, daily heparin level once stable  Monitor for signs of bleeding or thrombosis.  Patient is at high risk of bleeding with concomitant ASA/Plavix use.  Recommend using heparin for the shortest possible duration.  Dorrene German 11/01/2014, 5:38 AM

## 2014-11-01 NOTE — Progress Notes (Signed)
OT Cancellation Note  Patient Details Name: Carmen [name] MRN: [id number] DOB: [birth year]   Cancelled Treatment:    Reason Eval/Treat Not Completed: Other (comment).  Pt doesn't feel up to OT this am:  Noted she will have peripheral arteriogram Mon or Tues.   Wil check back.  Kahli Mayon 11/01/2014, 10:46 AM  Lesle Chris, OTR/L [phone] 11/01/2014

## 2014-11-01 NOTE — Progress Notes (Signed)
ANTICOAGULATION CONSULT NOTE - F/u Consult  Pharmacy Consult for heparin Indication: limb-threatening ischemia  Allergies  Allergen Reactions  . Peanuts [Peanut Oil] Swelling  . Norvasc [Amlodipine Besylate] Swelling  . Peanut-Containing Drug Products Other (See Comments)    Cause my stomach to hurt    Patient Measurements: Height: 5\' 3"  (160 cm) Weight: 93 lb 7.6 oz (42.4 kg) IBW/kg (Calculated) : 52.4  Vital Signs: Temp: 98.5 F (36.9 C) (08/26 0523) Temp Source: Oral (08/26 0523) BP: 163/65 mmHg (08/26 0755) Pulse Rate: 59 (08/26 0755)  Labs:  Recent Labs  10/30/14 1655 10/31/14 0517 10/31/14 2118 11/01/14 0429 11/01/14 1130  HGB  --   --   --  12.0  --   HCT  --   --   --  36.7  --   PLT  --   --   --  149*  --   APTT  --   --  29  --   --   LABPROT  --   --  13.0  --   --   INR  --   --  0.96  --   --   HEPARINUNFRC  --   --   --  0.32 0.36  CREATININE 1.59* 1.33*  --  1.41*  --     Estimated Creatinine Clearance: 22 mL/min (by C-G formula based on Cr of 1.41).   Medical History: Past Medical History  Diagnosis Date  . Hypertension   . Coronary artery disease   . Renal disorder   . Herniated lumbar intervertebral disc   . Cataract     "both eyes; blood pressure's always too high to have them fixed" (09/11/2014)  . Renovascular hypertension      s/p left renal artery stent 12/2007.  S/P balloon angioplasty on 02/16/10 for ISR, BP was  controlled well since then.     . Claudication in peripheral vascular disease     02/16/10: Left CIA 9.0x28 Omnilink and REIA 8.0x40 seff expanding Zilver.   right subclavian artery stent 03/18/2008,  . Peripheral vascular disease   . Anxiety   . Pacemaker   . Pneumonia X 2  . IDDM (insulin dependent diabetes mellitus)   . Migraine     "used to have terrible migraines; stopped in the 1990's"  . Stroke 1999    Stroke and TIA in 1999 with right sided weakness, now with residual right arm weakness (09/11/2014)  .  Arthritis     "everywhere"  . Chronic lower back pain     Medications:  Prescriptions prior to admission  Medication Sig Dispense Refill Last Dose  . aspirin EC 81 MG tablet Take 81 mg by mouth daily.   10/27/2014 at Unknown time  . benazepril (LOTENSIN) 40 MG tablet Take 40 mg by mouth daily.   10/27/2014 at Unknown time  . clopidogrel (PLAVIX) 75 MG tablet Take 75 mg by mouth daily.   10/27/2014 at Unknown time  . diazepam (VALIUM) 5 MG tablet Take 1 tablet (5 mg total) by mouth 2 (two) times daily. 15 tablet 0 10/28/2014 at Unknown time  . furosemide (LASIX) 20 MG tablet Take 2 tablets (40 mg total) by mouth daily. (Patient taking differently: Take 20 mg by mouth 2 (two) times daily. ) 30 tablet 3 10/27/2014 at Unknown time  . hydrALAZINE (APRESOLINE) 100 MG tablet Take 1 tablet (100 mg total) by mouth 3 (three) times daily. (Patient taking differently: Take 100 mg by mouth 4 (four) times daily. )  120 tablet 4 10/27/2014 at Unknown time  . insulin NPH-regular Human (NOVOLIN 70/30) (70-30) 100 UNIT/ML injection Inject 8 Units into the skin 2 (two) times daily with a meal. 10 mL 11 10/27/2014 at Unknown time  . isosorbide mononitrate (IMDUR) 120 MG 24 hr tablet Take 1 tablet (120 mg total) by mouth daily. 30 tablet 0 10/27/2014 at Unknown time  . lisinopril (ZESTRIL) 10 MG tablet Take 1 tablet (10 mg total) by mouth daily. 30 tablet 0 10/27/2014 at Unknown time  . metoprolol tartrate (LOPRESSOR) 25 MG tablet Take 1 tablet (25 mg total) by mouth 2 (two) times daily. 60 tablet 4 10/27/2014 at 1700  . Omega-3 Fatty Acids (FISH OIL PEARLS) 300 MG CAPS Take 1 tablet by mouth 2 (two) times daily.   10/27/2014 at Unknown time  . oxyCODONE-acetaminophen (PERCOCET/ROXICET) 5-325 MG per tablet Take 2 tablets by mouth every 6 (six) hours as needed for severe pain. 15 tablet 0 10/28/2014 at Unknown time  . potassium chloride SA (K-DUR,KLOR-CON) 10 MEQ tablet Take 1 tablet (10 mEq total) by mouth daily. 30 tablet 1  10/27/2014 at Unknown time  . [DISCONTINUED] mupirocin cream (BACTROBAN) 2 % Apply 1 application topically 2 (two) times daily. (Patient not taking: Reported on 09/11/2014) 15 g 0 Not Taking at Unknown time   Scheduled:  . antiseptic oral rinse  7 mL Mouth Rinse BID  . aspirin EC  81 mg Oral Daily  . clopidogrel  75 mg Oral Daily  . feeding supplement  1 Container Oral BID BM  . hydrALAZINE  100 mg Oral QID  . insulin aspart  0-9 Units Subcutaneous TID WC  . insulin aspart protamine- aspart  5 Units Subcutaneous BID WC  . isosorbide mononitrate  120 mg Oral Daily  . metoprolol tartrate  50 mg Oral BID  . omega-3 acid ethyl esters  1 g Oral BID  . polyethylene glycol  17 g Oral BID  . senna-docusate  2 tablet Oral BID    Assessment: 19 yoF with Hx HTN, DMT2, CVA, CAD, PAD with intermittent claudication x 3-4 months, now worsening in the past week with cold left foot.  Seen by cardiology and found to have moderate to severe plaque throughout LLE.  Due to extensive comorbidities, patient is high risk for renal failure and bleeding from arteriogram.  Plan is to place patient on heparin until SCr stabilizes, then proceed with arteriogram probably on 8/29 or 8/30   Baseline INR, aPTT: wnl  on ASA/Plavix PTA for hx CVA  Today, 11/01/14  Repeat INR therapeutic  No bleeding reported  Pltc trending down  Goal of Therapy: Heparin level 0.3-0.7 units/ml Monitor platelets by anticoagulation protocol: Yes  Plan:  Continue Heparin drip at 750 units/hr   Daily CBC, daily heparin level once stable  Monitor for signs of bleeding or thrombosis.  Patient is at high risk of bleeding with concomitant ASA/Plavix use.  Recommend using heparin for the shortest possible duration.  Transfer to [facility] for peripheral angiogram next week  Biagio Borg 11/01/2014, 1:07 PM

## 2014-11-01 NOTE — Progress Notes (Signed)
Subjective:  Pt reports feeling somewhat better this morning, mental status has improved.  Daughter at bedside and states patient ate breakfast this morning and hasn't been eating much for several days. Left leg and foot still extremely painful, although warm to touch.  No discoloration or ulcerations.   Objective:  Vital Signs in the last 24 hours: Temp:  [98.2 F (36.8 C)-98.5 F (36.9 C)] 98.5 F (36.9 C) (08/26 0523) Pulse Rate:  [59-67] 59 (08/26 0755) Resp:  [18-19] 18 (08/25 2144) BP: (149-227)/(47-65) 163/65 mmHg (08/26 0755) SpO2:  [98 %-100 %] 98 % (08/26 0523)  Intake/Output from previous day: 08/25 0701 - 08/26 0700 In: 422.8 [P.O.:360; I.V.:62.8] Out: 1225 [Urine:1225]  Physical Exam: General appearance: alert, cooperative, appears older than stated age, cachectic and no distress Lungs: clear to auscultation bilaterally Heart: regular rate and rhythm, S1, S2 normal, no murmur, click, rub or gallop Abdomen: soft, non-tender; bowel sounds normal; no masses, no organomegaly Extremities: Chronic right upper exudate contracture present. Left approximately normal. Right lower extremity in normal range of movement. Left leg painful to move. Left leg below the knee is now warmer. Severe paresthesia and tenderness on touch present in the left leg. No ulceration. Right lower extremity warm and normal. Neurologic: Grossly normal Vascular exam: Carotid bounding, soft bruit. Femoral artery: cord like. Normal pulses. Bilateral bruit present. Popliteal pulse feeble right absent left. Pedal pulse absent bilateral. Slow capillary refill left.    Lab Results: BMP  Recent Labs  10/30/14 1655 10/31/14 0517 11/01/14 0429  NA 136 142 138  K 4.4 4.2 5.0  CL 110 114* 113*  CO2 20* 21* 18*  GLUCOSE 231* 40* 269*  BUN 53* 44* 33*  CREATININE 1.59* 1.33* 1.41*  CALCIUM 8.4* 8.5* 8.5*  GFRNONAA 30* 37* 35*  GFRAA 35* 43* 40*    CBC  Recent Labs Lab 10/28/14 1245   11/01/14 0429  WBC 5.9  < > 5.4  RBC 4.71  < > 3.72*  HGB 15.0  < > 12.0  HCT 46.3*  < > 36.7  PLT 180  < > 149*  MCV 98.3  < > 98.7  MCH 31.8  < > 32.3  MCHC 32.4  < > 32.7  RDW 14.6  < > 14.8  LYMPHSABS 1.1  --   --   MONOABS 0.2  --   --   EOSABS 0.0  --   --   BASOSABS 0.0  --   --   < > = values in this interval not displayed.  HEMOGLOBIN A1C Lab Results  Component Value Date   HGBA1C 8.7* 09/11/2014   MPG 203 09/11/2014    Cardiac Panel (last 3 results)  Recent Labs  09/11/14 1522 10/28/14 1649  CKTOTAL  --  185  TROPONINI 0.04*  --     BNP (last 3 results) No results for input(s): PROBNP in the last 8760 hours.  TSH No results for input(s): TSH in the last 8760 hours.  CHOLESTEROL No results for input(s): CHOL in the last 8760 hours.  Hepatic Function Panel  Recent Labs  09/11/14 1522 10/28/14 1649  PROT 6.7 7.1  ALBUMIN 2.7* 3.9  AST 22 27  ALT 22 36  ALKPHOS 96 62  BILITOT 0.4 0.6    Imaging: No results found.  Cardiac Studies:  EKG 10/28/2014:: Normal sinus rhythm, LVH with repolarization abnormality.  Vascular Dopplers: 10/31/2014: Right - Moderate irregular calcific and non calcific plaque noted  throughout the entire lower extremity with  areas of acoustic  shadowing.. Doppler waveforms are abnormal. There is no evidence  of significant stenosis, - Left - Moderate to severe irregular calcific and non calcic  plaque noted throughout the entire lower extremity with areas of  acoustic shadowing. Doppler waveforms are abnormal and begin to  diminish to dampened monophasic with diminishing velocities in  the mid femoral artery continuing throughout the remainder of the  leg. there is no evidence of significant stenosis except in the  distal anterior tibial artery which appears occluded  Echo: 04/06/2013: Hyperdynamic left systolic function. EF 65-70%. Grade 1 diastolic dysfunction. Pacemaker wire noted in the right ventricle.  Moderate left enlargement. Mild to moderate pulmonary hypertension, PA pressure 41 mmHg.    Assessment/Plan:  1. Acute limb threatening ischemia left leg, patient has rest pain and cooler leg, although warmer today since being on Heparin gtt. No ulceration or infarct. 2. Peripheral arterial disease in diabetes mellitus. History of PTA and stenting of the right and left iliac arteries in the past, history of right subclavian artery stent, 2011, 2010 respectively. History of PTA and stenting of the right SFA on 05/29/2012 with 6.0 x 16 mm Zilver self-expanding stent and chocolate balloon angioplasty of the distal right SFA. 3 vessel r/u bilateral legs. Mild disease left. 3. Diabetes mellitus type 2 uncontrolled 4. History of tobacco use disorder, quit recently 5. Hypertension 6. Noncompliance with medical advice 7. Acute renal failure now improving serum creatinine, due to dehydration. Patient admitted with altered mental status.  Recommendation: pt reports feeling slightly better this morning and foot is warmer to touch since being on Heparin gtt, although still extremely painful with absent pulses.  Will arrange for transfer to [facility] in preparation for PV angio next week.    Rachel Bo, NP-C 11/01/2014, 8:31 AM [facility] Pager: [phone] Office: [phone]

## 2014-11-01 NOTE — Progress Notes (Signed)
[facility] notified RN to say that they are en route to Carmen [name] to transport this patient to [facility].  Pain medication was given to the patient.

## 2014-11-01 NOTE — Progress Notes (Signed)
This morning patient's SBP was over 200. Her pain medicine was given, apresoline IV was given, and her in and out cath was done. Even after that patient's BP was 197/60. NP was notified and gave orders for the RN to give the 10am BP meds.

## 2014-11-01 NOTE — Progress Notes (Signed)
Addendum  RN paged to say patient has recurrent urinary retention. Apparently had in and out Foley catheterization this morning. Currently unable to urinate and bladder scan shows urinary residuals >600 ML. Place Foley catheter.  Today's labs reviewed. Creatinine slightly higher at 1.41 and potassium starting to creep up again. Was not on IV fluids since yesterday (had been DC'd). Reinitiate IV fluids and follow BMP in a.m.. Mild thrombocytopenia. Follow CBC in a.m.  Then, patient has a bed at [facility] for transfer.  Vernell Leep, MD, FACP, FHM. [facility] Pager [phone]  If 7PM-7AM, please contact night-coverage [email or web address] Password [facility] 11/01/2014, 3:38 PM

## 2014-11-01 NOTE — Progress Notes (Addendum)
PT Cancellation Note  Patient Details Name: Carmen [name] MRN: [id number] DOB: 1933/08/11   Cancelled Treatment:    Reason Eval/Treat Not Completed: Fatigue/lethargy limiting ability to participate Pt not feeling up to therapy this morning.  Noted for possible arrangement for transfer to [facility] in preparation for peripheral arteriogram next week.     Billal Rollo,KATHrine E 11/01/2014, 11:50 AM Carmelia Bake, PT, DPT 11/01/2014 Pager: [phone]

## 2014-11-01 NOTE — Progress Notes (Addendum)
PROGRESS NOTE    Carmen [name] WOE:[id number] DOB: May 23, 1933 DOA: 10/28/2014 PCP: Maximino Greenland, MD  HPI/Brief narrative 81 year old female with history of HTN, IDDM/DM 2, CVA with right upper extremity contracture, CKD3, chronic low back pain, CAD, PAD, PPM, history of intermittent left leg pain for the last 3-4 months which has progressively worsened in the last week, seen at [facility] ED on 8/21 at which time assessed to have radiculopathy related pain-EDP discussed with Dr. Einar Gip stated that she has chronically cool left extremity without palpable or dopplerable distal pulses. She was given pain medications and discharged home but returned and admitted to the [facility] on 10/28/14 due to confusion and persistent left leg pain. She was found with acute renal failure, hyperkalemia and hematuria. Acute renal failure and hyperkalemia have resolved. Patient has acute on chronic left lower extremity limb threatening ischemia. Dr. Einar Gip has consulted and plans peripheral arteriogram on 8/29 or 8/30. He recommends transferring patient to [facility] in case she requires emergent intervention or for elective procedure early next week.   Assessment/Plan:  Acute on stage III chronic kidney disease - Baseline creatinine probably in the 1.2-1.5 range. Admitted with creatinine of 2.3. - Most likely related to poor oral intake and dehydration. - No hydronephrosis by CT renal stone study. - Hydrated with IV fluids, creatinine now at baseline.  - Acute kidney injury resolved. - We will continue gentle IV fluid hydration in preparation for peripheral arteriogram on Monday or Tuesday of next week. Follow BMP.  Hyperkalemia - Secondary to acute on chronic kidney disease. Also on ACEI/lisinopril and potassium supplements at home-held. - Provided a dose of Kayexalate 8/24.  - ? RTA 4 also a possibility. - DC heparin which can at times cause hyperkalemia. - Resolved.  Acute on chronic left lower extremity  limb threatening ischemia/Left leg pain  - No reported trauma. Picture not consistent with peripheral neuropathy or radiculopathy pain. X-ray negative for fractures. Lower extremity venous Dopplers negative.   - Lower extremity arterial Doppler results as below. - Dr. Einar Gip consultation appreciated and discussed with him this morning. He has discussed extensively with patient and family regarding high risk procedure/peripheral arteriogram. Patient has severe rest pain not relieved with pain medications. So as to improve lifestyle and quality, family has opted to proceed with peripheral arteriogram being aware of all risks. - IV heparin started per pharmacy. - Transfer to [facility]  Hematuria  - Microscopic hematuria. CT renal stone study: No stone. Started empirically on IV Rocephin for possible UTI-urine culture negative. Will DC IV Rocephin. Consider outpatient consultation with urology.  - We will repeat urine microscopy.  Acute encephalopathy - Secondary to metabolic derangement (acute renal failure) and opioids. Resolved. - CT head without acute findings.  Uncontrolled type II DM/IDDM/hypoglycemia - On home dose of 70/30 insulin and SSI added. Hypoglycemic with CBG of 41 this morning. Reduced 70/30 insulin to 5 units twice a day. Monitor closely. No further hypoglycemic episodes.  Essential hypertension - uncontrolled. Continue metoprolol (will increase dose from 25 to 50 twice a day on 8/26), Imdur and hydralazine. Holding ACEI secondary to acute renal failure. May consider amlodipine if continues to have uncontrolled hypertension.  PAD status post bilateral iliac stenting Management as above.  CVA with residual R UE contracture - Continue aspirin and Plavix  Failure to thrive  Fecal impaction - Started bowel regimen. Had BM 8/24   S/P PPM   DVT prophylaxis: On IV heparin drip.   Code Status: Full  Family Communication: Discussed with granddaughter at bedside on 8/26.    Disposition Plan: Transfer to [facility]. Eventually will need discharge to SNF when medically stable.   Consultants:  Vascular/Dr. Einar Gip  Procedures:  Bilateral lower extremity arterial duplex 10/31/14.   Preliminary report: Duplex scan of the lower extremity arterial system reveal moderate calcific and non calcific plaque throughout the right and moderate to severe calcific and non calcific plaque Through out the left. Doppler waveforms on the right were biphasic in the common femoral artery diminishing to dampened monophasic in the distal tibial arteries. The Doppler waveforms on the left were biphasic in the proximal common femoral artery diminishing to monophasic into the popliteal. The Doppler waveforms throughout the tibial arteries diminished to dampened monophasic with severelydiminishing velocities in the distal region. There is no evidence of a total occlusion.  Antibiotics:  Rocephin 8/22 > 8/24  Subjective: Continued left leg pain despite pain medications.   Objective: Filed Vitals:   10/31/14 2144 11/01/14 0523 11/01/14 0632 11/01/14 0755  BP: 180/57 227/63 197/60 163/65  Pulse: 63 67  59  Temp: 98.5 F (36.9 C) 98.5 F (36.9 C)    TempSrc: Oral Oral    Resp: 18     Height:      Weight:      SpO2: 98% 98%      Intake/Output Summary (Last 24 hours) at 11/01/14 1029 Last data filed at 11/01/14 0600  Gross per 24 hour  Intake 302.75 ml  Output   1225 ml  Net -922.25 ml   Filed Weights   10/28/14 1606 10/28/14 1632  Weight: 44.906 kg (99 lb) 42.4 kg (93 lb 7.6 oz)     Exam:  General exam: Pleasant elderly, frail female lying comfortably supine in bed.  Respiratory system: Clear. No increased work of breathing. Cardiovascular system: S1 & S2 heard, RRR. No JVD, murmurs, gallops, clicks or pedal edema.  Gastrointestinal system: Abdomen is nondistended, soft and nontender. Normal bowel sounds heard. Central nervous system: Alert and oriented. No focal  neurological deficits. Extremities: Symmetric 5 x 5 power. Left leg below knee cool to touch compared to right, absent left dorsalis pedis and posterior tibial pulses.? Feeble left popliteal pulse. Left femoral pulse well palpable.   Data Reviewed: Basic Metabolic Panel:  Recent Labs Lab 10/28/14 1649 10/29/14 0434 10/30/14 0511 10/30/14 1655 10/31/14 0517  NA 136 143 138 136 142  K 6.1* 5.5* 5.5* 4.4 4.2  CL 113* 114* 110 110 114*  CO2 14* 19* 18* 20* 21*  GLUCOSE 233* 135* 126* 231* 40*  BUN 90* 86* 65* 53* 44*  CREATININE 2.23* 2.12* 1.70* 1.59* 1.33*  CALCIUM 9.4 9.2 8.9 8.4* 8.5*   Liver Function Tests:  Recent Labs Lab 10/28/14 1649  AST 27  ALT 36  ALKPHOS 62  BILITOT 0.6  PROT 7.1  ALBUMIN 3.9   No results for input(s): LIPASE, AMYLASE in the last 168 hours. No results for input(s): AMMONIA in the last 168 hours. CBC:  Recent Labs Lab 10/28/14 1245 10/29/14 0434  WBC 5.9 4.3  NEUTROABS 4.6  --   HGB 15.0 14.0  HCT 46.3* 42.4  MCV 98.3 96.4  PLT 180 182   Cardiac Enzymes:  Recent Labs Lab 10/28/14 1649  CKTOTAL 185   BNP (last 3 results) No results for input(s): PROBNP in the last 8760 hours. CBG:  Recent Labs Lab 10/31/14 0711 10/31/14 0801 10/31/14 1154 10/31/14 1644 10/31/14 2203  GLUCAP 75 124* 179* 73 84  Recent Results (from the past 240 hour(s))  Urine culture     Status: None   Collection Time: 10/28/14  1:56 PM  Result Value Ref Range Status   Specimen Description URINE, CATHETERIZED  Final   Special Requests NONE  Final   Culture   Final    NO GROWTH 1 DAY Performed at [facility]    Report Status 10/30/2014 FINAL  Final      Studies: No results found.      Scheduled Meds: . antiseptic oral rinse  7 mL Mouth Rinse BID  . aspirin EC  81 mg Oral Daily  . clopidogrel  75 mg Oral Daily  . feeding supplement  1 Container Oral BID BM  . hydrALAZINE  100 mg Oral QID  . insulin aspart  0-9 Units  Subcutaneous TID WC  . insulin aspart protamine- aspart  5 Units Subcutaneous BID WC  . isosorbide mononitrate  120 mg Oral Daily  . metoprolol tartrate  25 mg Oral BID  . omega-3 acid ethyl esters  1 g Oral BID  . polyethylene glycol  17 g Oral BID  . senna-docusate  2 tablet Oral BID   Continuous Infusions: . sodium chloride    . heparin 750 Units/hr (10/31/14 2138)    Active Problems:   Altered mental status   Confusion    Time spent: 40 minutes.    Vernell Leep, MD, FACP, FHM. [facility] Pager [phone]  If 7PM-7AM, please contact night-coverage [email or web address] Password [email or web address] 11/01/2014, 10:29 AM    LOS: 4 days

## 2014-11-02 DIAGNOSIS — I998 Other disorder of circulatory system: Secondary | ICD-10-CM | POA: Diagnosis present

## 2014-11-02 DIAGNOSIS — R339 Retention of urine, unspecified: Secondary | ICD-10-CM | POA: Clinically undetermined

## 2014-11-02 DIAGNOSIS — E860 Dehydration: Secondary | ICD-10-CM

## 2014-11-02 DIAGNOSIS — R319 Hematuria, unspecified: Secondary | ICD-10-CM | POA: Insufficient documentation

## 2014-11-02 DIAGNOSIS — N179 Acute kidney failure, unspecified: Secondary | ICD-10-CM | POA: Insufficient documentation

## 2014-11-02 DIAGNOSIS — E1101 Type 2 diabetes mellitus with hyperosmolarity with coma: Secondary | ICD-10-CM

## 2014-11-02 DIAGNOSIS — E875 Hyperkalemia: Secondary | ICD-10-CM | POA: Diagnosis present

## 2014-11-02 DIAGNOSIS — G9340 Encephalopathy, unspecified: Secondary | ICD-10-CM | POA: Insufficient documentation

## 2014-11-02 LAB — GLUCOSE, CAPILLARY
GLUCOSE-CAPILLARY: 148 mg/dL — AB (ref 65–99)
GLUCOSE-CAPILLARY: 242 mg/dL — AB (ref 65–99)
Glucose-Capillary: 148 mg/dL — ABNORMAL HIGH (ref 65–99)
Glucose-Capillary: 277 mg/dL — ABNORMAL HIGH (ref 65–99)
Glucose-Capillary: 59 mg/dL — ABNORMAL LOW (ref 65–99)
Glucose-Capillary: 104 mg/dL — ABNORMAL HIGH (ref 65–99)

## 2014-11-02 LAB — HEPARIN LEVEL (UNFRACTIONATED)
HEPARIN UNFRACTIONATED: 0.19 [IU]/mL — AB (ref 0.30–0.70)
Heparin Unfractionated: 0.21 IU/mL — ABNORMAL LOW (ref 0.30–0.70)

## 2014-11-02 MED ORDER — METOPROLOL TARTRATE 50 MG PO TABS: 50.0000 mg | ORAL_TABLET | Freq: Two times a day (BID) | ORAL | Status: DC

## 2014-11-02 MED ORDER — METOPROLOL TARTRATE 50 MG PO TABS: 75.0000 mg | ORAL_TABLET | Freq: Two times a day (BID) | ORAL | Status: DC

## 2014-11-02 MED ORDER — TAMSULOSIN HCL 0.4 MG PO CAPS
0.4000 mg | ORAL_CAPSULE | Freq: Every day | ORAL | Status: DC
  Administered 2014-11-02 – 2014-11-06 (×5): 0.4 mg via ORAL
  Filled 2014-11-02 (×5): qty 1

## 2014-11-02 MED ORDER — METOPROLOL TARTRATE 50 MG PO TABS
75.0000 mg | ORAL_TABLET | Freq: Two times a day (BID) | ORAL | Status: DC
  Administered 2014-11-02 – 2014-11-07 (×11): 75 mg via ORAL
  Filled 2014-11-02 (×22): qty 1

## 2014-11-02 MED ORDER — SODIUM CHLORIDE 0.45 % IV SOLN
INTRAVENOUS | Status: DC
  Administered 2014-11-02 – 2014-11-03 (×2): via INTRAVENOUS
  Filled 2014-11-02 (×2): qty 1000

## 2014-11-02 NOTE — Progress Notes (Signed)
Hypoglycemic Event  CBG: 59   Treatment: 15 GM carbohydrate snack  Symptoms: None  Follow-up CBG: Time: 2232  CBG Result:104  Possible Reasons for Event: Inadequate meal intake  Comments/MD notified: on call physician notified    Carmen [name]  Remember to initiate Hypoglycemia Order Set & complete

## 2014-11-02 NOTE — Progress Notes (Signed)
ANTICOAGULATION CONSULT NOTE - F/u Consult  Pharmacy Consult for heparin Indication: limb-threatening ischemia  Allergies  Allergen Reactions  . Peanuts [Peanut Oil] Swelling  . Norvasc [Amlodipine Besylate] Swelling  . Peanut-Containing Drug Products Other (See Comments)    Cause my stomach to hurt    Patient Measurements: Height: 5\' 4"  (162.6 cm) Weight: 108 lb 3.9 oz (49.1 kg) IBW/kg (Calculated) : 54.7  Vital Signs: Temp: 99 F (37.2 C) (08/27 1327) Temp Source: Oral (08/27 1327) BP: 140/61 mmHg (08/27 1327) Pulse Rate: 59 (08/27 1327)  Labs:  Recent Labs  10/31/14 0517 10/31/14 2118  11/01/14 0429 11/01/14 1130 11/02/14 1009 11/02/14 1900  HGB  --   --   --  12.0  --   --   --   HCT  --   --   --  36.7  --   --   --   PLT  --   --   --  149*  --   --   --   APTT  --  29  --   --   --   --   --   LABPROT  --  13.0  --   --   --   --   --   INR  --  0.96  --   --   --   --   --   HEPARINUNFRC  --   --   < > 0.32 0.36 0.21* 0.19*  CREATININE 1.33*  --   --  1.41*  --   --   --   < > = values in this interval not displayed.  Estimated Creatinine Clearance: 25.5 mL/min (by C-G formula based on Cr of 1.41).  Assessment: 38 yoF with Hx HTN, DMT2, CVA, CAD, PAD with intermittent claudication x 3-4 months, now worsening in the past week with cold left foot.  Seen by cardiology and found to have moderate to severe plaque throughout LLE. She continue on IV heparin. Heparin level is now subtherapeutic.   Repeat HL remains subtherapeutic at 0.19 on heparin 850 units/hr. Nurse reports no issues with infusion or bleeding.  Goal of Therapy: Heparin level 0.3-0.7 units/ml Monitor platelets by anticoagulation protocol: Yes  Plan: - Increase heparin gtt to 1050 units/hr - Check an 8 hour heparin level - Daily heparin level and CBC - Watch platelets closely  Andrey Cota. Diona Foley, PharmD Clinical Pharmacist Pager [phone] 11/02/2014 8:03 PM

## 2014-11-02 NOTE — Progress Notes (Signed)
[facility] PROGRESS NOTE  Carmen [name] VEH:[id number] DOB: 07-16-1933 DOA: 10/28/2014 PCP: Maximino Greenland, MD  HPI/Brief narrative 81 year old female with history of HTN, IDDM/DM 2, CVA with right upper extremity contracture, CKD3, chronic low back pain, CAD, PAD, PPM, history of intermittent left leg pain for the last 3-4 months which has progressively worsened in the last week, seen at [facility] ED on 8/21 at which time assessed to have radiculopathy related pain-EDP discussed with Dr. Einar Gip stated that she has chronically cool left extremity without palpable or dopplerable distal pulses. She was given pain medications and discharged home but returned and admitted to the [facility] on 10/28/14 due to confusion and persistent left leg pain. She was found with acute renal failure, hyperkalemia and hematuria. Acute renal failure and hyperkalemia have resolved. Patient has acute on chronic left lower extremity limb threatening ischemia. Dr. Einar Gip has consulted and plans peripheral arteriogram on 8/29 or 8/30. He recommended transferring patient to [facility] in case she requires emergent intervention or for elective procedure early next week.   Assessment/Plan: Acute on chronic left lower extremity limb threatening ischemia/Left leg pain  - No reported trauma. Picture not consistent with peripheral neuropathy or radiculopathy pain. X-ray negative for fractures. Lower extremity venous Dopplers negative.  - Lower extremity arterial Doppler results as below.  - Dr. Einar Gip consultation appreciated and Dr Algis Liming discussed with him  11/01/2014. He has discussed extensively with patient and family regarding high risk procedure/peripheral arteriogram. Patient has severe rest pain not relieved with pain medications. So as to improve lifestyle and quality, family has opted to proceed with peripheral arteriogram being aware of all risks.  - IV heparin started per pharmacy.  - Per Vascular/Dr Einar Gip. Continue  current pain management.  Acute on stage III chronic kidney disease  - Baseline creatinine probably in the 1.2-1.5 range. Admitted with creatinine of 2.3.  - Most likely related to poor oral intake and dehydration.  - No hydronephrosis by CT renal stone study.  - Hydrated with IV fluids, creatinine now at baseline.  - Acute kidney injury resolved.  - We will continue gentle IV fluid hydration in preparation for peripheral arteriogram on Monday or Tuesday of next week. Follow BMP.  Hyperkalemia  - Secondary to acute on chronic kidney disease. Also on ACEI/lisinopril and potassium supplements at home-held.  - Provided a dose of Kayexalate 8/24.  - ? RTA 4 also a possibility.  - Resolved.   Hematuria  - Microscopic hematuria. CT renal stone study: No stone. UTI-urine culture negative. Status post 3 days IV Rocephin. Consider outpatient consultation with urology.   Urinary retention Status post Foley catheter placement. We'll place on Flomax. Will likely need a voiding trial in a few days prior to discharge.   Acute encephalopathy - Secondary to metabolic derangement (acute renal failure) and opioids. Resolved. - CT head without acute findings.  Uncontrolled type II DM/IDDM/hypoglycemia - On home dose of 70/30 insulin and SSI added. Hypoglycemic with CBG of 41 on 11/01/2014. Reduced 70/30 insulin to 5 units twice a day. Monitor closely. No further hypoglycemic episodes.  Essential hypertension - uncontrolled. BP improved. Increase metoprolol to 75 mg twice daily. Continue Imdur and hydralazine. Holding ACEI secondary to acute renal failure. May consider amlodipine if continues to have uncontrolled hypertension.  PAD status post bilateral iliac stenting Management as above.  CVA with residual R UE contracture - Continue aspirin and Plavix for secondary stroke prevention. PT/OT.  Failure to thrive  Fecal impaction - Continue  bowel regimen. Had BM 8/24   S/P PPM   DVT  prophylaxis: On IV heparin drip.    Code Status: Full Family Communication: Updated patient. No family at bedside. Disposition Plan: Likely skilled nursing facility when medically stable.   Consultants:  Vascular/Dr. Einar Gip  Procedures:  Bilateral lower extremity arterial duplex 10/31/14.  Preliminary report: Duplex scan of the lower extremity arterial system reveal moderate calcific and non calcific plaque throughout the right and moderate to severe calcific and non calcific plaque Through out the left. Doppler waveforms on the right were biphasic in the common femoral artery diminishing to dampened monophasic in the distal tibial arteries. The Doppler waveforms on the left were biphasic in the proximal common femoral artery diminishing to monophasic into the popliteal. The Doppler waveforms throughout the tibial arteries diminished to dampened monophasic with severelydiminishing velocities in the distal region. There is no evidence of a total occlusion.  CT head 10/28/2014 Plain films of the left tib-fib 10/29/2014  lower extremity venous Dopplers  Antibiotics:  IV Rocephin 10/28/2014>>>> 10/30/2014  HPI/Subjective: Patient states as long as she remains still and does not move her left lower extremity pain is controlled. Patient states extreme pain on palpation. Patient denies any shortness of breath. No chest pain.  Objective: Filed Vitals:   11/02/14 1327  BP: 140/61  Pulse: 59  Temp: 99 F (37.2 C)  Resp: 18    Intake/Output Summary (Last 24 hours) at 11/02/14 1801 Last data filed at 11/02/14 1528  Gross per 24 hour  Intake   2075 ml  Output   1750 ml  Net    325 ml   Filed Weights   10/28/14 1606 10/28/14 1632 11/01/14 2039  Weight: 44.906 kg (99 lb) 42.4 kg (93 lb 7.6 oz) 49.1 kg (108 lb 3.9 oz)    Exam:   General:  Frail elderly female in no acute cardiopulmonary distress.  Cardiovascular: RRR  Respiratory: CTAB.  Abdomen: Soft, nontender,  nondistended, positive bowel sounds.  Musculoskeletal: No clubbing cyanosis or edema. Left lower extremity exquisitely tender to palpation. unable to palpate a pulse in the left foot.  Data Reviewed: Basic Metabolic Panel:  Recent Labs Lab 10/29/14 0434 10/30/14 0511 10/30/14 1655 10/31/14 0517 11/01/14 0429  NA 143 138 136 142 138  K 5.5* 5.5* 4.4 4.2 5.0  CL 114* 110 110 114* 113*  CO2 19* 18* 20* 21* 18*  GLUCOSE 135* 126* 231* 40* 269*  BUN 86* 65* 53* 44* 33*  CREATININE 2.12* 1.70* 1.59* 1.33* 1.41*  CALCIUM 9.2 8.9 8.4* 8.5* 8.5*   Liver Function Tests:  Recent Labs Lab 10/28/14 1649  AST 27  ALT 36  ALKPHOS 62  BILITOT 0.6  PROT 7.1  ALBUMIN 3.9   No results for input(s): LIPASE, AMYLASE in the last 168 hours. No results for input(s): AMMONIA in the last 168 hours. CBC:  Recent Labs Lab 10/28/14 1245 10/29/14 0434 11/01/14 0429  WBC 5.9 4.3 5.4  NEUTROABS 4.6  --   --   HGB 15.0 14.0 12.0  HCT 46.3* 42.4 36.7  MCV 98.3 96.4 98.7  PLT 180 182 149*   Cardiac Enzymes:  Recent Labs Lab 10/28/14 1649  CKTOTAL 185   BNP (last 3 results)  Recent Labs  09/11/14 1522  BNP 3381.1*    ProBNP (last 3 results) No results for input(s): PROBNP in the last 8760 hours.  CBG:  Recent Labs Lab 11/01/14 2122 11/02/14 0743 11/02/14 1137 11/02/14 1558 11/02/14 1741  GLUCAP  92 148* 148* 277* 242*    Recent Results (from the past 240 hour(s))  Urine culture     Status: None   Collection Time: 10/28/14  1:56 PM  Result Value Ref Range Status   Specimen Description URINE, CATHETERIZED  Final   Special Requests NONE  Final   Culture   Final    NO GROWTH 1 DAY Performed at [facility]    Report Status 10/30/2014 FINAL  Final     Studies: No results found.  Scheduled Meds: . antiseptic oral rinse  7 mL Mouth Rinse BID  . aspirin EC  81 mg Oral Daily  . clopidogrel  75 mg Oral Daily  . feeding supplement  1 Container Oral BID BM   . hydrALAZINE  100 mg Oral QID  . insulin aspart  0-9 Units Subcutaneous TID WC  . insulin aspart protamine- aspart  5 Units Subcutaneous BID WC  . isosorbide mononitrate  120 mg Oral Daily  . metoprolol tartrate  75 mg Oral BID  . omega-3 acid ethyl esters  1 g Oral BID  . polyethylene glycol  17 g Oral BID  . senna-docusate  2 tablet Oral BID  . tamsulosin  0.4 mg Oral QPC supper   Continuous Infusions: . heparin 850 Units/hr (11/02/14 1105)  . sodium chloride 0.45 % 1,000 mL infusion 75 mL/hr at 11/02/14 1409    Principal Problem:   Lower limb ischemia; LLE Active Problems:   Peripheral neuropathy   History of CVA (cerebrovascular accident) with associated mild right upper extremity hemiplegia   Pacemaker-[employer]   Type 2 diabetes mellitus with hyperosmolar nonketotic hyperglycemia   CKD (chronic kidney disease), stage III   HTN (hypertension)   Renal failure (ARF), acute on chronic   Altered mental status   Confusion   Urinary retention   Hyperkalemia    Time spent: 27 minutes    Drystan Reader M.D. [facility] Pager [phone]. If 7PM-7AM, please contact night-coverage at [email or web address], password [facility] 11/02/2014, 6:01 PM  LOS: 5 days

## 2014-11-02 NOTE — Progress Notes (Signed)
ANTICOAGULATION CONSULT NOTE - F/u Consult  Pharmacy Consult for heparin Indication: limb-threatening ischemia  Allergies  Allergen Reactions  . Peanuts [Peanut Oil] Swelling  . Norvasc [Amlodipine Besylate] Swelling  . Peanut-Containing Drug Products Other (See Comments)    Cause my stomach to hurt    Patient Measurements: Height: 5\' 4"  (162.6 cm) Weight: 108 lb 3.9 oz (49.1 kg) IBW/kg (Calculated) : 54.7  Vital Signs: Temp: 98.9 F (37.2 C) (08/27 0737) Temp Source: Oral (08/27 0737) BP: 189/65 mmHg (08/27 0737) Pulse Rate: 71 (08/27 0737)  Labs:  Recent Labs  10/30/14 1655 10/31/14 0517 10/31/14 2118 11/01/14 0429 11/01/14 1130 11/02/14 1009  HGB  --   --   --  12.0  --   --   HCT  --   --   --  36.7  --   --   PLT  --   --   --  149*  --   --   APTT  --   --  29  --   --   --   LABPROT  --   --  13.0  --   --   --   INR  --   --  0.96  --   --   --   HEPARINUNFRC  --   --   --  0.32 0.36 0.21*  CREATININE 1.59* 1.33*  --  1.41*  --   --     Estimated Creatinine Clearance: 25.5 mL/min (by C-G formula based on Cr of 1.41).  Assessment: Carmen [name] with Hx HTN, DMT2, CVA, CAD, PAD with intermittent claudication x 3-4 months, now worsening in the past week with cold left foot.  Seen by cardiology and found to have moderate to severe plaque throughout LLE. She continue on IV heparin. Heparin level is now subtherapeutic.   Goal of Therapy: Heparin level 0.3-0.7 units/ml Monitor platelets by anticoagulation protocol: Yes  Plan: - Increase heparin gtt to 850 units/hr - Check an 8 hour heparin level - Daily heparin level and CBC - Watch platelets closely  Salome Arnt, PharmD, BCPS Pager # [phone] 11/02/2014 10:56 AM

## 2014-11-03 DIAGNOSIS — Z8673 Personal history of transient ischemic attack (TIA), and cerebral infarction without residual deficits: Secondary | ICD-10-CM

## 2014-11-03 LAB — BASIC METABOLIC PANEL
ANION GAP: 6 (ref 5–15)
BUN: 21 mg/dL — AB (ref 6–20)
CO2: 18 mmol/L — ABNORMAL LOW (ref 22–32)
Calcium: 8.1 mg/dL — ABNORMAL LOW (ref 8.9–10.3)
Chloride: 107 mmol/L (ref 101–111)
Creatinine, Ser: 1.19 mg/dL — ABNORMAL HIGH (ref 0.44–1.00)
GFR, EST AFRICAN AMERICAN: 49 mL/min — AB (ref >60)
GFR, EST NON AFRICAN AMERICAN: 43 mL/min — AB (ref >60)
Glucose, Bld: 114 mg/dL — ABNORMAL HIGH (ref 65–99)
POTASSIUM: 5.1 mmol/L (ref 3.5–5.1)
SODIUM: 131 mmol/L — AB (ref 135–145)

## 2014-11-03 LAB — CBC
HCT: 34.2 % — ABNORMAL LOW (ref 36.0–46.0)
HEMOGLOBIN: 11.4 g/dL — AB (ref 12.0–15.0)
MCH: 32.3 pg (ref 26.0–34.0)
MCHC: 33.3 g/dL (ref 30.0–36.0)
MCV: 96.9 fL (ref 78.0–100.0)
PLATELETS: 135 10*3/uL — AB (ref 150–400)
RBC: 3.53 MIL/uL — AB (ref 3.87–5.11)
RDW: 14.3 % (ref 11.5–15.5)
WBC: 5.2 10*3/uL (ref 4.0–10.5)

## 2014-11-03 LAB — GLUCOSE, CAPILLARY
GLUCOSE-CAPILLARY: 112 mg/dL — AB (ref 65–99)
GLUCOSE-CAPILLARY: 166 mg/dL — AB (ref 65–99)
Glucose-Capillary: 89 mg/dL (ref 65–99)
Glucose-Capillary: 133 mg/dL — ABNORMAL HIGH (ref 65–99)

## 2014-11-03 LAB — HEPARIN LEVEL (UNFRACTIONATED)
HEPARIN UNFRACTIONATED: 0.45 [IU]/mL (ref 0.30–0.70)
HEPARIN UNFRACTIONATED: 0.68 [IU]/mL (ref 0.30–0.70)

## 2014-11-03 MED ORDER — STERILE WATER FOR INJECTION IV SOLN
INTRAVENOUS | Status: DC
  Administered 2014-11-03 – 2014-11-04 (×2): via INTRAVENOUS
  Filled 2014-11-03 (×2): qty 850

## 2014-11-03 MED ORDER — SODIUM BICARBONATE 8.4 % IV SOLN: INTRAVENOUS | Status: DC

## 2014-11-03 NOTE — Progress Notes (Signed)
[facility] PROGRESS NOTE  Carmen [name] FWY:[id number] DOB: May 13, 1933 DOA: 10/28/2014 PCP: Maximino Greenland, MD  HPI/Brief narrative 81 year old female with history of HTN, IDDM/DM 2, CVA with right upper extremity contracture, CKD3, chronic low back pain, CAD, PAD, PPM, history of intermittent left leg pain for the last 3-4 months which has progressively worsened in the last week, seen at [facility] ED on 8/21 at which time assessed to have radiculopathy related pain-EDP discussed with Dr. Einar Gip stated that she has chronically cool left extremity without palpable or dopplerable distal pulses. She was given pain medications and discharged home but returned and admitted to the [facility] on 10/28/14 due to confusion and persistent left leg pain. She was found with acute renal failure, hyperkalemia and hematuria. Acute renal failure and hyperkalemia have resolved. Patient has acute on chronic left lower extremity limb threatening ischemia. Dr. Einar Gip has consulted and plans peripheral arteriogram on 8/29 or 8/30. He recommended transferring patient to [facility] in case she requires emergent intervention or for elective procedure early next week.   Assessment/Plan: Acute on chronic left lower extremity limb threatening ischemia/Left leg pain  - No reported trauma. Picture not consistent with peripheral neuropathy or radiculopathy pain. X-ray negative for fractures. Lower extremity venous Dopplers negative.  - Lower extremity arterial Doppler results as below.  - Dr. Einar Gip consultation appreciated and Dr Algis Liming discussed with him  11/01/2014. He has discussed extensively with patient and family regarding high risk procedure/peripheral arteriogram. Patient has severe rest pain not relieved with pain medications. So as to improve lifestyle and quality, family has opted to proceed with peripheral arteriogram being aware of all risks.  - IV heparin started per pharmacy.  - Per Vascular/Dr Einar Gip. Continue  current pain management.  Acute on stage III chronic kidney disease  - Baseline creatinine probably in the 1.2-1.5 range. Admitted with creatinine of 2.3.  - Most likely related to poor oral intake and dehydration.  - No hydronephrosis by CT renal stone study.  - Hydrated with IV fluids, creatinine now at baseline.  - Acute kidney injury resolved.  - We will continue gentle IV fluid hydration in preparation for peripheral arteriogram on Monday or Tuesday of next week. Follow BMP.   Hyperkalemia  - Secondary to acute on chronic kidney disease. Also on ACEI/lisinopril and potassium supplements at home-held.  - Provided a dose of Kayexalate 8/24.  - ? RTA 4 also a possibility.  - Resolved.   Metabolic acidosis Place on bicarbonate drip.  Hematuria  - Microscopic hematuria. CT renal stone study: No stone. UTI-urine culture negative. Status post 3 days IV Rocephin. Consider outpatient consultation with urology.   Urinary retention Status post Foley catheter placement. Continue Flomax. Will likely need a voiding trial in a few days prior to discharge.   Acute encephalopathy - Secondary to metabolic derangement (acute renal failure) and opioids. Resolved. - CT head without acute findings.  Uncontrolled type II DM/IDDM/hypoglycemia - On home dose of 70/30 insulin and SSI added. Hypoglycemic with CBG of 41 on 11/01/2014. CBG 59 on 11/02/2014. D/C 70/30. Monitor closely. SSI.  Essential hypertension - uncontrolled. BP improved. Increased metoprolol to 75 mg twice daily. Continue Imdur and hydralazine. Holding ACEI secondary to acute renal failure. May consider amlodipine if continues to have uncontrolled hypertension.  PAD status post bilateral iliac stenting Management as above.  CVA with residual R UE contracture - Continue aspirin and Plavix for secondary stroke prevention. PT/OT.  Failure to thrive  Fecal impaction - Continue  bowel regimen.   S/P PPM   DVT prophylaxis: On  IV heparin drip.    Code Status: Full Family Communication: Updated patient. No family at bedside. Disposition Plan: Likely skilled nursing facility when medically stable.   Consultants:  Vascular/Dr. Einar Gip  Procedures:  Bilateral lower extremity arterial duplex 10/31/14.  Preliminary report: Duplex scan of the lower extremity arterial system reveal moderate calcific and non calcific plaque throughout the right and moderate to severe calcific and non calcific plaque Through out the left. Doppler waveforms on the right were biphasic in the common femoral artery diminishing to dampened monophasic in the distal tibial arteries. The Doppler waveforms on the left were biphasic in the proximal common femoral artery diminishing to monophasic into the popliteal. The Doppler waveforms throughout the tibial arteries diminished to dampened monophasic with severelydiminishing velocities in the distal region. There is no evidence of a total occlusion.  CT head 10/28/2014 Plain films of the left tib-fib 10/29/2014  lower extremity venous Dopplers  Antibiotics:  IV Rocephin 10/28/2014>>>> 10/30/2014  HPI/Subjective: Patient states as long as she remains still and does not move her left lower extremity pain is controlled. Patient states extreme pain on palpation. Patient denies any shortness of breath. No chest pain.  Objective: Filed Vitals:   11/03/14 0923  BP: 184/49  Pulse: 62  Temp:   Resp:     Intake/Output Summary (Last 24 hours) at 11/03/14 1239 Last data filed at 11/03/14 0901  Gross per 24 hour  Intake 2087.83 ml  Output   1150 ml  Net 937.83 ml   Filed Weights   10/28/14 1606 10/28/14 1632 11/01/14 2039  Weight: 44.906 kg (99 lb) 42.4 kg (93 lb 7.6 oz) 49.1 kg (108 lb 3.9 oz)    Exam:   General:  Frail elderly female in no acute cardiopulmonary distress.  Cardiovascular: RRR  Respiratory: CTAB.  Abdomen: Soft, nontender, nondistended, positive bowel  sounds.  Musculoskeletal: No clubbing cyanosis or edema. Left lower extremity exquisitely tender to palpation. unable to palpate a pulse in the left foot.  Data Reviewed: Basic Metabolic Panel:  Recent Labs Lab 10/30/14 0511 10/30/14 1655 10/31/14 0517 11/01/14 0429 11/03/14 0500  NA 138 136 142 138 131*  K 5.5* 4.4 4.2 5.0 5.1  CL 110 110 114* 113* 107  CO2 18* 20* 21* 18* 18*  GLUCOSE 126* 231* 40* 269* 114*  BUN 65* 53* 44* 33* 21*  CREATININE 1.70* 1.59* 1.33* 1.41* 1.19*  CALCIUM 8.9 8.4* 8.5* 8.5* 8.1*   Liver Function Tests:  Recent Labs Lab 10/28/14 1649  AST 27  ALT 36  ALKPHOS 62  BILITOT 0.6  PROT 7.1  ALBUMIN 3.9   No results for input(s): LIPASE, AMYLASE in the last 168 hours. No results for input(s): AMMONIA in the last 168 hours. CBC:  Recent Labs Lab 10/28/14 1245 10/29/14 0434 11/01/14 0429 11/03/14 0500  WBC 5.9 4.3 5.4 5.2  NEUTROABS 4.6  --   --   --   HGB 15.0 14.0 12.0 11.4*  HCT 46.3* 42.4 36.7 34.2*  MCV 98.3 96.4 98.7 96.9  PLT 180 182 149* 135*   Cardiac Enzymes:  Recent Labs Lab 10/28/14 1649  CKTOTAL 185   BNP (last 3 results)  Recent Labs  09/11/14 1522  BNP 3381.1*    ProBNP (last 3 results) No results for input(s): PROBNP in the last 8760 hours.  CBG:  Recent Labs Lab 11/02/14 1741 11/02/14 2155 11/02/14 2232 11/03/14 0754 11/03/14 1134  GLUCAP 242*  106* 104* 112* 166*    Recent Results (from the past 240 hour(s))  Urine culture     Status: None   Collection Time: 10/28/14  1:56 PM  Result Value Ref Range Status   Specimen Description URINE, CATHETERIZED  Final   Special Requests NONE  Final   Culture   Final    NO GROWTH 1 DAY Performed at [facility]    Report Status 10/30/2014 FINAL  Final     Studies: No results found.  Scheduled Meds: . antiseptic oral rinse  7 mL Mouth Rinse BID  . aspirin EC  81 mg Oral Daily  . clopidogrel  75 mg Oral Daily  . feeding supplement  1  Container Oral BID BM  . hydrALAZINE  100 mg Oral QID  . insulin aspart  0-9 Units Subcutaneous TID WC  . isosorbide mononitrate  120 mg Oral Daily  . metoprolol tartrate  75 mg Oral BID  . omega-3 acid ethyl esters  1 g Oral BID  . polyethylene glycol  17 g Oral BID  . senna-docusate  2 tablet Oral BID  . tamsulosin  0.4 mg Oral QPC supper   Continuous Infusions: . heparin 1,050 Units/hr (11/03/14 0517)  .  sodium bicarbonate 150 mEq in sterile water 1000 mL infusion 50 mL/hr at 11/03/14 1027    Principal Problem:   Lower limb ischemia; LLE Active Problems:   Peripheral neuropathy   History of CVA (cerebrovascular accident) with associated mild right upper extremity hemiplegia   Pacemaker-[employer]   Type 2 diabetes mellitus with hyperosmolar nonketotic hyperglycemia   CKD (chronic kidney disease), stage III   HTN (hypertension)   Renal failure (ARF), acute on chronic   Altered mental status   Confusion   Urinary retention   Hyperkalemia   Acute renal failure syndrome   Hematuria   Acute encephalopathy    Time spent: 70 minutes    Sofi Bryars M.D. [facility] Pager [phone]. If 7PM-7AM, please contact night-coverage at [email or web address], password [facility] 11/03/2014, 12:39 PM  LOS: 6 days

## 2014-11-03 NOTE — Progress Notes (Signed)
ANTICOAGULATION CONSULT NOTE - F/u Consult  Pharmacy Consult for heparin Indication: limb-threatening ischemia  Allergies  Allergen Reactions  . Peanuts [Peanut Oil] Swelling  . Norvasc [Amlodipine Besylate] Swelling  . Peanut-Containing Drug Products Other (See Comments)    Cause my stomach to hurt    Patient Measurements: Height: 5\' 4"  (162.6 cm) Weight: 108 lb 3.9 oz (49.1 kg) IBW/kg (Calculated) : 54.7  Vital Signs: Temp: 98.9 F (37.2 C) (08/28 0539) Temp Source: Oral (08/28 0539) BP: 179/59 mmHg (08/28 0539) Pulse Rate: 60 (08/28 0539)  Labs:  Recent Labs  10/31/14 2118 11/01/14 0429  11/02/14 1009 11/02/14 1900 11/03/14 0500  HGB  --  12.0  --   --   --  11.4*  HCT  --  36.7  --   --   --  34.2*  PLT  --  149*  --   --   --  135*  APTT 29  --   --   --   --   --   LABPROT 13.0  --   --   --   --   --   INR 0.96  --   --   --   --   --   HEPARINUNFRC  --  0.32  < > 0.21* 0.19* 0.45  CREATININE  --  1.41*  --   --   --  1.19*  < > = values in this interval not displayed.  Estimated Creatinine Clearance: 30.2 mL/min (by C-G formula based on Cr of 1.19).  Assessment: [name] on heparin for LLE ischemia. Heparin level therapeutic (0.45) on 1050 units/hr. Hgb and plt down a bit - will watch. No bleeding noted.  Goal of Therapy: Heparin level 0.3-0.7 units/ml Monitor platelets by anticoagulation protocol: Yes  Plan: - Continue heparin gtt at 1050 units/hr - Check a 6 hour heparin level to confirm therapeutic - Daily heparin level and CBC - Watch platelets closely  Sherlon Handing, PharmD, BCPS Clinical pharmacist, pager [phone] 11/03/2014 5:49 AM

## 2014-11-03 NOTE — Progress Notes (Signed)
ANTICOAGULATION CONSULT NOTE - F/u Consult  Pharmacy Consult for heparin Indication: limb-threatening ischemia  Allergies  Allergen Reactions  . Peanuts [Peanut Oil] Swelling  . Norvasc [Amlodipine Besylate] Swelling  . Peanut-Containing Drug Products Other (See Comments)    Cause my stomach to hurt    Patient Measurements: Height: 5\' 4"  (162.6 cm) Weight: 108 lb 3.9 oz (49.1 kg) IBW/kg (Calculated) : 54.7  Vital Signs: Temp: 98.9 F (37.2 C) (08/28 1320) Temp Source: Oral (08/28 1320) BP: 150/54 mmHg (08/28 1320) Pulse Rate: 60 (08/28 1320)  Labs:  Recent Labs  10/31/14 2118 11/01/14 0429  11/02/14 1900 11/03/14 0500 11/03/14 1359  HGB  --  12.0  --   --  11.4*  --   HCT  --  36.7  --   --  34.2*  --   PLT  --  149*  --   --  135*  --   APTT 29  --   --   --   --   --   LABPROT 13.0  --   --   --   --   --   INR 0.96  --   --   --   --   --   HEPARINUNFRC  --  0.32  < > 0.19* 0.45 0.68  CREATININE  --  1.41*  --   --  1.19*  --   < > = values in this interval not displayed.  Estimated Creatinine Clearance: 30.2 mL/min (by C-G formula based on Cr of 1.19).  Assessment: [name] on heparin for LLE ischemia. Heparin level therapeutic is therapeutic at 0.68 but at the very upper end of the goal range. No bleeding noted. Platelets are down slightly.   Goal of Therapy: Heparin level 0.3-0.7 units/ml Monitor platelets by anticoagulation protocol: Yes  Plan: - Reduce heparin gtt slightly to 1000 units/hr - F/u daily heparin level and CBC  Salome Arnt, PharmD, BCPS Pager # [phone] 11/03/2014 2:24 PM

## 2014-11-04 LAB — BASIC METABOLIC PANEL
ANION GAP: 6 (ref 5–15)
BUN: 20 mg/dL (ref 6–20)
CHLORIDE: 105 mmol/L (ref 101–111)
CO2: 22 mmol/L (ref 22–32)
Calcium: 8.3 mg/dL — ABNORMAL LOW (ref 8.9–10.3)
Creatinine, Ser: 1.24 mg/dL — ABNORMAL HIGH (ref 0.44–1.00)
GFR calc Af Amer: 47 mL/min — ABNORMAL LOW (ref >60)
GFR calc non Af Amer: 41 mL/min — ABNORMAL LOW (ref >60)
GLUCOSE: 141 mg/dL — AB (ref 65–99)
POTASSIUM: 4.9 mmol/L (ref 3.5–5.1)
Sodium: 133 mmol/L — ABNORMAL LOW (ref 135–145)

## 2014-11-04 LAB — URINE MICROSCOPIC-ADD ON

## 2014-11-04 LAB — GLUCOSE, CAPILLARY
GLUCOSE-CAPILLARY: 271 mg/dL — AB (ref 65–99)
GLUCOSE-CAPILLARY: 257 mg/dL — AB (ref 65–99)
GLUCOSE-CAPILLARY: 124 mg/dL — AB (ref 65–99)
Glucose-Capillary: 136 mg/dL — ABNORMAL HIGH (ref 65–99)

## 2014-11-04 LAB — URINALYSIS, ROUTINE W REFLEX MICROSCOPIC
BILIRUBIN URINE: NEGATIVE
GLUCOSE, UA: NEGATIVE mg/dL
Ketones, ur: NEGATIVE mg/dL
Nitrite: NEGATIVE
Protein, ur: 100 mg/dL — AB
SPECIFIC GRAVITY, URINE: 1.007 (ref 1.005–1.030)
UROBILINOGEN UA: 0.2 mg/dL (ref 0.0–1.0)
pH: 6 (ref 5.0–8.0)

## 2014-11-04 LAB — CBC
HEMATOCRIT: 31.4 % — AB (ref 36.0–46.0)
HEMOGLOBIN: 10.5 g/dL — AB (ref 12.0–15.0)
MCH: 31.5 pg (ref 26.0–34.0)
MCHC: 33.4 g/dL (ref 30.0–36.0)
MCV: 94.3 fL (ref 78.0–100.0)
Platelets: 161 10*3/uL (ref 150–400)
RBC: 3.33 MIL/uL — AB (ref 3.87–5.11)
RDW: 13.9 % (ref 11.5–15.5)
WBC: 4.8 10*3/uL (ref 4.0–10.5)

## 2014-11-04 LAB — HEPARIN LEVEL (UNFRACTIONATED): HEPARIN UNFRACTIONATED: 0.45 [IU]/mL (ref 0.30–0.70)

## 2014-11-04 MED ORDER — DEXTROSE 5 % IV SOLN
1.5000 g | INTRAVENOUS | Status: AC
  Filled 2014-11-04: qty 1.5

## 2014-11-04 MED ORDER — BENAZEPRIL HCL 40 MG PO TABS
40.0000 mg | ORAL_TABLET | Freq: Every day | ORAL | Status: DC
  Filled 2014-11-04: qty 1

## 2014-11-04 NOTE — Progress Notes (Signed)
[facility] PROGRESS NOTE  Carmen [name] OZD:[id number] DOB: 1933/12/12 DOA: 10/28/2014 PCP: Maximino Greenland, MD  HPI/Brief narrative 81 year old female with history of HTN, IDDM/DM 2, CVA with right upper extremity contracture, CKD3, chronic low back pain, CAD, PAD, PPM, history of intermittent left leg pain for the last 3-4 months which has progressively worsened in the last week, seen at [facility] ED on 8/21 at which time assessed to have radiculopathy related pain-EDP discussed with Dr. Einar Gip stated that she has chronically cool left extremity without palpable or dopplerable distal pulses. She was given pain medications and discharged home but returned and admitted to the [facility] on 10/28/14 due to confusion and persistent left leg pain. She was found with acute renal failure, hyperkalemia and hematuria. Acute renal failure and hyperkalemia have resolved. Patient has acute on chronic left lower extremity limb threatening ischemia. Dr. Einar Gip has consulted and plans peripheral arteriogram on 8/29 or 8/30. He recommended transferring patient to [facility] in case she requires emergent intervention or for elective procedure early next week.   Assessment/Plan: Acute on chronic left lower extremity limb threatening ischemia/Left leg pain  - No reported trauma. Picture not consistent with peripheral neuropathy or radiculopathy pain. X-ray negative for fractures. Lower extremity venous Dopplers negative.  - Lower extremity arterial Doppler results as below.  - Dr. Einar Gip consultation appreciated and Dr Algis Liming discussed with him  11/01/2014. He has discussed extensively with patient and family regarding high risk procedure/peripheral arteriogram. Patient has severe rest pain not relieved with pain medications. So as to improve lifestyle and quality, family has opted to proceed with peripheral arteriogram being aware of all risks.  - IV heparin started per pharmacy. Patient with some slightly tinged  urine which is pink. Will monitor closely if increased bleeding will likely need to discontinue IV heparin. - Per Vascular/Dr Einar Gip. Continue current pain management.  Acute on stage III chronic kidney disease  - Baseline creatinine probably in the 1.2-1.5 range. Admitted with creatinine of 2.3.  - Most likely related to poor oral intake and dehydration.  - No hydronephrosis by CT renal stone study.  - Hydrated with IV fluids, creatinine now at baseline.  - Acute kidney injury resolved.  - We will continue gentle IV fluid hydration in preparation for peripheral arteriogram on Monday or Tuesday of next week. Follow BMP.   Hyperkalemia  - Secondary to acute on chronic kidney disease. Also on ACEI/lisinopril and potassium supplements at home-held.  - Provided a dose of Kayexalate 8/24.  - ? RTA 4 also a possibility.  - Resolved.   Metabolic acidosis Improved. Discontinue bicarbonate drip.  Hematuria  - Microscopic hematuria. CT renal stone study: No stone. UTI-urine culture negative. Status post 3 days IV Rocephin. Consider outpatient consultation with urology.   Urinary retention Status post Foley catheter placement. Continue Flomax. Will likely need a voiding trial in a few days prior to discharge.   Acute encephalopathy - Secondary to metabolic derangement (acute renal failure) and opioids. Resolved. - CT head without acute findings.  Uncontrolled type II DM/IDDM/hypoglycemia - Hypoglycemic episodes with CBG of 41 on 11/01/2014. CBG 59 on 11/02/2014. D/C'd 70/30. Monitor closely. SSI.  Essential hypertension - uncontrolled. BP improved. Continue metoprolol 75 mg twice daily. Continue Imdur and hydralazine. Holding ACEI secondary to acute renal failure.   PAD status post bilateral iliac stenting Management as above.  CVA with residual R UE contracture - Continue aspirin and Plavix for secondary stroke prevention. PT/OT.  Failure to thrive  Fecal impaction - Continue bowel  regimen.   S/P PPM   DVT prophylaxis: On IV heparin drip.    Code Status: Full Family Communication: Updated patient. No family at bedside. Disposition Plan: Likely skilled nursing facility when medically stable.   Consultants:  Vascular/Dr. Einar Gip  Procedures:  Bilateral lower extremity arterial duplex 10/31/14.  Preliminary report: Duplex scan of the lower extremity arterial system reveal moderate calcific and non calcific plaque throughout the right and moderate to severe calcific and non calcific plaque Through out the left. Doppler waveforms on the right were biphasic in the common femoral artery diminishing to dampened monophasic in the distal tibial arteries. The Doppler waveforms on the left were biphasic in the proximal common femoral artery diminishing to monophasic into the popliteal. The Doppler waveforms throughout the tibial arteries diminished to dampened monophasic with severelydiminishing velocities in the distal region. There is no evidence of a total occlusion.  CT head 10/28/2014 Plain films of the left tib-fib 10/29/2014  lower extremity venous Dopplers  Antibiotics:  IV Rocephin 10/28/2014>>>> 10/30/2014  HPI/Subjective: Patient with pink color urine. Per nursing patient also with some slight vaginal smear. Patient denies any chest pain. No shortness of breath. Patient still with left lower extremity exquisite tenderness.   Objective: Filed Vitals:   11/04/14 1216  BP: 178/60  Pulse:   Temp:   Resp:     Intake/Output Summary (Last 24 hours) at 11/04/14 1244 Last data filed at 11/04/14 1133  Gross per 24 hour  Intake 1845.67 ml  Output   2050 ml  Net -204.33 ml   Filed Weights   10/28/14 1606 10/28/14 1632 11/01/14 2039  Weight: 44.906 kg (99 lb) 42.4 kg (93 lb 7.6 oz) 49.1 kg (108 lb 3.9 oz)    Exam:   General:  Frail elderly female in no acute cardiopulmonary distress.  Cardiovascular: RRR  Respiratory: CTAB.  Abdomen: Soft,  nontender, nondistended, positive bowel sounds.  Musculoskeletal: No clubbing cyanosis or edema. Left lower extremity exquisitely tender to palpation. unable to palpate a pulse in the left foot.  Data Reviewed: Basic Metabolic Panel:  Recent Labs Lab 10/30/14 1655 10/31/14 0517 11/01/14 0429 11/03/14 0500 11/04/14 0256  NA 136 142 138 131* 133*  K 4.4 4.2 5.0 5.1 4.9  CL 110 114* 113* 107 105  CO2 20* 21* 18* 18* 22  GLUCOSE 231* 40* 269* 114* 141*  BUN 53* 44* 33* 21* 20  CREATININE 1.59* 1.33* 1.41* 1.19* 1.24*  CALCIUM 8.4* 8.5* 8.5* 8.1* 8.3*   Liver Function Tests:  Recent Labs Lab 10/28/14 1649  AST 27  ALT 36  ALKPHOS 62  BILITOT 0.6  PROT 7.1  ALBUMIN 3.9   No results for input(s): LIPASE, AMYLASE in the last 168 hours. No results for input(s): AMMONIA in the last 168 hours. CBC:  Recent Labs Lab 10/28/14 1245 10/29/14 0434 11/01/14 0429 11/03/14 0500 11/04/14 0256  WBC 5.9 4.3 5.4 5.2 4.8  NEUTROABS 4.6  --   --   --   --   HGB 15.0 14.0 12.0 11.4* 10.5*  HCT 46.3* 42.4 36.7 34.2* 31.4*  MCV 98.3 96.4 98.7 96.9 94.3  PLT 180 182 149* 135* 161   Cardiac Enzymes:  Recent Labs Lab 10/28/14 1649  CKTOTAL 185   BNP (last 3 results)  Recent Labs  09/11/14 1522  BNP 3381.1*    ProBNP (last 3 results) No results for input(s): PROBNP in the last 8760 hours.  CBG:  Recent Labs Lab 11/03/14  1134 11/03/14 1608 11/03/14 2144 11/04/14 0744 11/04/14 1129  GLUCAP 166* 89 133* 136* 271*    Recent Results (from the past 240 hour(s))  Urine culture     Status: None   Collection Time: 10/28/14  1:56 PM  Result Value Ref Range Status   Specimen Description URINE, CATHETERIZED  Final   Special Requests NONE  Final   Culture   Final    NO GROWTH 1 DAY Performed at [facility]    Report Status 10/30/2014 FINAL  Final     Studies: No results found.  Scheduled Meds: . antiseptic oral rinse  7 mL Mouth Rinse BID  . aspirin EC   81 mg Oral Daily  . [START ON 11/05/2014] cefUROXime (ZINACEF)  IV  1.5 g Intravenous On Call to OR  . clopidogrel  75 mg Oral Daily  . feeding supplement  1 Container Oral BID BM  . hydrALAZINE  100 mg Oral QID  . insulin aspart  0-9 Units Subcutaneous TID WC  . isosorbide mononitrate  120 mg Oral Daily  . metoprolol tartrate  75 mg Oral BID  . omega-3 acid ethyl esters  1 g Oral BID  . polyethylene glycol  17 g Oral BID  . senna-docusate  2 tablet Oral BID  . tamsulosin  0.4 mg Oral QPC supper   Continuous Infusions: . heparin 1,000 Units/hr (11/04/14 0454)    Principal Problem:   Lower limb ischemia; LLE Active Problems:   Peripheral neuropathy   History of CVA (cerebrovascular accident) with associated mild right upper extremity hemiplegia   Pacemaker-[employer]   Type 2 diabetes mellitus with hyperosmolar nonketotic hyperglycemia   CKD (chronic kidney disease), stage III   HTN (hypertension)   Renal failure (ARF), acute on chronic   Altered mental status   Confusion   Urinary retention   Hyperkalemia   Acute renal failure syndrome   Hematuria   Acute encephalopathy    Time spent: 9 minutes    THOMPSON,DANIEL M.D. [facility] Pager [phone]. If 7PM-7AM, please contact night-coverage at [email or web address], password [facility] 11/04/2014, 12:44 PM  LOS: 7 days

## 2014-11-04 NOTE — Progress Notes (Signed)
ANTICOAGULATION CONSULT NOTE - F/u Consult  Pharmacy Consult for heparin Indication: limb-threatening ischemia  Allergies  Allergen Reactions  . Peanuts [Peanut Oil] Swelling  . Norvasc [Amlodipine Besylate] Swelling  . Peanut-Containing Drug Products Other (See Comments)    Cause my stomach to hurt    Patient Measurements: Height: 5\' 4"  (162.6 cm) Weight: 108 lb 3.9 oz (49.1 kg) IBW/kg (Calculated) : 54.7  Vital Signs: Temp: 99.1 F (37.3 C) (08/29 0556) BP: 215/100 mmHg (08/29 1017) Pulse Rate: 60 (08/29 1017)  Labs:  Recent Labs  11/03/14 0500 11/03/14 1359 11/04/14 0256  HGB 11.4*  --  10.5*  HCT 34.2*  --  31.4*  PLT 135*  --  161  HEPARINUNFRC 0.45 0.68 0.45  CREATININE 1.19*  --  1.24*    Estimated Creatinine Clearance: 29 mL/min (by C-G formula based on Cr of 1.24).  Assessment:  CC: L leg pain, confusion  PMH: back pain, CVA, migraine, OA, HTN, CAD, PVD, anxiety, DM  AC: Heparin for limb ischemia, very painful - HL 0.45, Hgb 11.4>10.5, Plts 161 ok. - Scheduled for PV angiogram tomorrow.  Goal of Therapy: Heparin level 0.3-0.7 units/ml Monitor platelets by anticoagulation protocol: Yes  Plan: Continue IV heparin at 1000 units/hr Daily HL and CBC    Bryann Mcnealy S. Lenis Highland, PharmD, [facility] Clinical Staff Pharmacist Pager [phone]   11/04/2014 11:58 AM

## 2014-11-04 NOTE — Evaluation (Signed)
Occupational Therapy Evaluation Patient Details Name: Carmen [name] MRN: [id number] DOB: [birth year] Today's Date: 11/04/2014    History of Present Illness Pt is a 81 year old female admitted for confusion and CKD. PMH:  CVA with Rt UE hemiparesis, CHF, DM, HTN, CAD, back surgery, PVD, pacemaker, PVD, claudication   Clinical Impression   PT admitted with CKD and LLE pain. Pt currently with functional limitiations due to the deficits listed below (see OT problem list). PTA independent with all adls. Pt will benefit from skilled OT to increase their independence and safety with adls and balance to allow discharge SNF.     Follow Up Recommendations  SNF    Equipment Recommendations  Other (comment) (defer)    Recommendations for Other Services       Precautions / Restrictions Precautions Precautions: Fall Precaution Comments: L LE ischemia/pain Restrictions Weight Bearing Restrictions:  (self WBAT due to pain in LLE)      Mobility Bed Mobility Overal bed mobility: Needs Assistance Bed Mobility: Supine to Sit     Supine to sit: Min assist     General bed mobility comments: MIN (A) to progress to EOB with LOB posteriorly. Pt with L UE used to help with balance  Transfers Overall transfer level: Needs assistance Equipment used: Rolling walker (2 wheeled) Transfers: Sit to/from Stand Sit to Stand: +2 safety/equipment;Mod assist         General transfer comment: limited LLE WB and attempting to place on floor with facial grimace    Balance Overall balance assessment: Needs assistance Sitting-balance support: Single extremity supported;Feet supported Sitting balance-Leahy Scale: Poor     Standing balance support: Bilateral upper extremity supported Standing balance-Leahy Scale: Zero Standing balance comment: decr R UE grasp on RW                            ADL Overall ADL's : Needs assistance/impaired     Grooming: Wash/dry face;Minimal  assistance;Sitting   Upper Body Bathing: Moderate assistance;Sitting   Lower Body Bathing: Maximal assistance;Sit to/from stand       Lower Body Dressing: Maximal assistance;Bed level Lower Body Dressing Details (indicate cue type and reason): don socks Toilet Transfer: +2 for physical assistance;Moderate assistance             General ADL Comments: pt completed LB dressing bed level and then tranferred to chair . Pt with extreme pain in L LE     Vision Vision Assessment?:  (reports all vision is blurry at baseline and no current chan)   Perception     Praxis      Pertinent Vitals/Pain Pain Assessment: Faces Pain Score:  (0- rest, 8 if someone touches it or she puts weight on it) Faces Pain Scale: Hurts whole lot Pain Location: LLE Pain Intervention(s): Limited activity within patient's tolerance     Hand Dominance Right   Extremity/Trunk Assessment Upper Extremity Assessment Upper Extremity Assessment: RUE deficits/detail RUE Deficits / Details: residual deficits noted- abduction ~10 degrees shoulder, shoulder flexion ~20 degree, supination/ pronation wfl, digit wfl elbow 90 degrees tone noted at bicep RUE Sensation: decreased light touch RUE Coordination: decreased gross motor   Lower Extremity Assessment Lower Extremity Assessment: Defer to PT evaluation   Cervical / Trunk Assessment Cervical / Trunk Assessment: Kyphotic   Communication Communication Communication: No difficulties   Cognition Arousal/Alertness: Awake/alert Behavior During Therapy: WFL for tasks assessed/performed Overall Cognitive Status: Within Functional Limits for tasks assessed  General Comments       Exercises Exercises: General Lower Extremity     Shoulder Instructions      Home Living Family/patient expects to be discharged to:: Private residence Living Arrangements: Spouse/significant other;Children Available Help at Discharge: Family;Available  24 hours/day Type of Home: House Home Access: Stairs to enter [employer] of Steps: 2 Entrance Stairs-Rails: Right;Left Home Layout: One level     Bathroom Shower/Tub: Occupational psychologist: Handicapped height     Home Equipment: Environmental consultant - 2 wheels;Cane - single point;Bedside commode          Prior Functioning/Environment Level of Independence: Independent        Comments: independent with mobility, reports she has aide for household chores    OT Diagnosis: Generalized weakness;Acute pain   OT Problem List: Decreased strength;Decreased activity tolerance;Impaired balance (sitting and/or standing);Decreased safety awareness;Decreased knowledge of use of DME or AE;Decreased knowledge of precautions;Pain   OT Treatment/Interventions: Self-care/ADL training;Therapeutic exercise;Energy conservation;DME and/or AE instruction;Therapeutic activities;Patient/family education;Balance training    OT Goals(Current goals can be found in the care plan section) Acute Rehab OT Goals Patient Stated Goal: to figure out what is wrong with this leg OT Goal Formulation: With patient Time For Goal Achievement: 11/18/14 Potential to Achieve Goals: Good  OT Frequency: Min 2X/week   Barriers to D/C:            Co-evaluation PT/OT/SLP Co-Evaluation/Treatment: Yes Reason for Co-Treatment: Complexity of the patient's impairments (multi-system involvement) PT goals addressed during session: Mobility/safety with mobility OT goals addressed during session: ADL's and self-care      End of Session Equipment Utilized During Treatment: Gait belt;Rolling walker Nurse Communication: Mobility status;Precautions  Activity Tolerance: Patient limited by pain Patient left: in chair;with call bell/phone within reach;with nursing/sitter in room   Time: 1200-1223 OT Time Calculation (min): 23 min Charges:  OT General Charges $OT Visit: 1 Procedure OT Evaluation $Initial OT  Evaluation Tier I: 1 Procedure G-Codes:    Peri Maris 25-Nov-2014, 1:56 PM  Jeri Modena   OTR/L Pager: [phone] Office: [phone] .

## 2014-11-04 NOTE — Progress Notes (Addendum)
Physical Therapy Treatment Patient Details Name: Carmen [name] MRN: [id number] DOB: December 01, 1933 Today's Date: 11/04/2014    History of Present Illness Pt is a 81 year old female admitted for confusion and CKD. PMH:  CVA with Rt UE hemiparesis, CHF, DM, HTN, CAD, back surgery, PVD, pacemaker, PVD, claudication    PT Comments    Pt mobility greatly limited by L LE pain. Awaiting results of arteriogram to further assess pt mobility and d/c recommendations.  Follow Up Recommendations  SNF;Supervision/Assistance - 24 hour     Equipment Recommendations       Recommendations for Other Services       Precautions / Restrictions Precautions Precautions: Fall Precaution Comments: L LE ischemia/pain Restrictions Weight Bearing Restrictions:  (self WBAT due to pain in LLE)    Mobility  Bed Mobility Overal bed mobility: Needs Assistance Bed Mobility: Supine to Sit     Supine to sit: Min assist     General bed mobility comments: minA to scoot to edge of bed, limited functional use of R UE due to contraction  Transfers Overall transfer level: Needs assistance Equipment used: Rolling walker (2 wheeled) Transfers: Sit to/from [employer] Sit to Stand: Min assist;Mod assist;+2 safety/equipment         General transfer comment: limited L LE WBing, assist to off weight L LE due to limited WBing ability with R UE as well. increased time, c/o L LE pain  Ambulation/Gait             General Gait Details: unable to tolerate ambulation at this time due to L LE pain, tolerated 4 steps to chair   Stairs            Wheelchair Mobility    Modified Rankin (Stroke Patients Only)       Balance Overall balance assessment: Needs assistance         Standing balance support: Bilateral upper extremity supported Standing balance-Leahy Scale: Zero Standing balance comment: limited by L LE pain                    Cognition Arousal/Alertness:  Awake/alert Behavior During Therapy: WFL for tasks assessed/performed Overall Cognitive Status: Within Functional Limits for tasks assessed                      Exercises General Exercises - Lower Extremity Ankle Circles/Pumps: AROM;Both;10 reps;Seated Long Arc Quad: AROM;Left;10 reps;Seated    General Comments General comments (skin integrity, edema, etc.): educated on importance of L LE elevation and AROM to help blood flow      Pertinent Vitals/Pain Pain Assessment: 0-10 Pain Score:  (0- rest, 8 if someone touches it or she puts weight on it) Pain Location: LLE Pain Intervention(s): Limited activity within patient's tolerance    Home Living                      Prior Function            PT Goals (current goals can now be found in the care plan section) Progress towards PT goals: Progressing toward goals    Frequency  Min 3X/week    PT Plan Current plan remains appropriate    Co-evaluation PT/OT/SLP Co-Evaluation/Treatment: Yes Reason for Co-Treatment: Complexity of the patient's impairments (multi-system involvement) PT goals addressed during session: Mobility/safety with mobility       End of Session Equipment Utilized During Treatment: Gait belt Activity Tolerance: Patient limited by pain  Patient left: in chair;with call bell/phone within reach;with nursing/sitter in room     Time: 1156-1221 PT Time Calculation (min) (ACUTE ONLY): 25 min  Charges:  $Therapeutic Activity: 8-22 mins                    G Codes:      Kingsley Callander 11/04/2014, 1:36 PM   Kittie Plater, PT, DPT Pager #: [phone] Office #: [phone]

## 2014-11-04 NOTE — Progress Notes (Signed)
Subjective:  Pt denies any new symptoms or concerns. Left leg and foot still extremely painful, although warm to touch.  No discoloration or ulcerations.   Objective:  Vital Signs in the last 24 hours: Temp:  [98.4 F (36.9 C)-99.1 F (37.3 C)] 99.1 F (37.3 C) (08/29 0556) Pulse Rate:  [59-64] 61 (08/29 0556) Resp:  [16-17] 16 (08/29 0556) BP: (150-198)/(49-63) 198/58 mmHg (08/29 0556) SpO2:  [95 %-97 %] 95 % (08/29 0556)  Intake/Output from previous day: 08/28 0701 - 08/29 0700 In: 1442.3 [P.O.:600; I.V.:842.3] Out: 1600 [Urine:1600]  Physical Exam: General appearance: alert, cooperative, appears older than stated age, cachectic and no distress Lungs: clear to auscultation bilaterally Heart: regular rate and rhythm, S1, S2 normal, no murmur, click, rub or gallop Abdomen: soft, non-tender; bowel sounds normal; no masses, no organomegaly Extremities: Chronic right upper exudate contracture present. Left approximately normal. Right lower extremity in normal range of movement. Left leg painful to move. Left leg below the knee is now warmer. Severe paresthesia and tenderness on touch present in the left leg. No ulceration. Right lower extremity warm and normal. Neurologic: Grossly normal Vascular exam: Carotid bounding, soft bruit. Femoral artery: cord like. Normal pulses. Bilateral bruit present. Popliteal pulse feeble right absent left. Pedal pulse absent bilateral. Slow capillary refill left.    Lab Results: BMP  Recent Labs  11/01/14 0429 11/03/14 0500 11/04/14 0256  NA 138 131* 133*  K 5.0 5.1 4.9  CL 113* 107 105  CO2 18* 18* 22  GLUCOSE 269* 114* 141*  BUN 33* 21* 20  CREATININE 1.41* 1.19* 1.24*  CALCIUM 8.5* 8.1* 8.3*  GFRNONAA 35* 43* 41*  GFRAA 40* 49* 47*    CBC  Recent Labs Lab 10/28/14 1245  11/04/14 0256  WBC 5.9  < > 4.8  RBC 4.71  < > 3.33*  HGB 15.0  < > 10.5*  HCT 46.3*  < > 31.4*  PLT 180  < > 161  MCV 98.3  < > 94.3  MCH 31.8  < > 31.5   MCHC 32.4  < > 33.4  RDW 14.6  < > 13.9  LYMPHSABS 1.1  --   --   MONOABS 0.2  --   --   EOSABS 0.0  --   --   BASOSABS 0.0  --   --   < > = values in this interval not displayed.  HEMOGLOBIN A1C Lab Results  Component Value Date   HGBA1C 8.7* 09/11/2014   MPG 203 09/11/2014    Cardiac Panel (last 3 results)  Recent Labs  09/11/14 1522 10/28/14 1649  CKTOTAL  --  185  TROPONINI 0.04*  --     BNP (last 3 results) No results for input(s): PROBNP in the last 8760 hours.  TSH No results for input(s): TSH in the last 8760 hours.  CHOLESTEROL No results for input(s): CHOL in the last 8760 hours.  Hepatic Function Panel  Recent Labs  09/11/14 1522 10/28/14 1649  PROT 6.7 7.1  ALBUMIN 2.7* 3.9  AST 22 27  ALT 22 36  ALKPHOS 96 62  BILITOT 0.4 0.6    Imaging: No results found.  Cardiac Studies:  EKG 10/28/2014:: Normal sinus rhythm, LVH with repolarization abnormality.  Vascular Dopplers: 10/31/2014: Right - Moderate irregular calcific and non calcific plaque noted  throughout the entire lower extremity with areas of acoustic  shadowing.. Doppler waveforms are abnormal. There is no evidence  of significant stenosis, - Left - Moderate to severe  irregular calcific and non calcic  plaque noted throughout the entire lower extremity with areas of  acoustic shadowing. Doppler waveforms are abnormal and begin to  diminish to dampened monophasic with diminishing velocities in  the mid femoral artery continuing throughout the remainder of the  leg. there is no evidence of significant stenosis except in the  distal anterior tibial artery which appears occluded  Echo: 04/06/2013: Hyperdynamic left systolic function. EF 65-70%. Grade 1 diastolic dysfunction. Pacemaker wire noted in the right ventricle. Moderate left enlargement. Mild to moderate pulmonary hypertension, PA pressure 41 mmHg.    Assessment/Plan:  1. Acute limb threatening ischemia left leg,  patient has rest pain and cooler leg, although warmer now since being on Heparin gtt. No ulceration or infarct. 2. Peripheral arterial disease in diabetes mellitus. History of PTA and stenting of the right and left iliac arteries in the past, history of right subclavian artery stent, 2011, 2010 respectively. History of PTA and stenting of the right SFA on 05/29/2012 with 6.0 x 16 mm Zilver self-expanding stent and chocolate balloon angioplasty of the distal right SFA. 3 vessel r/u bilateral legs. Mild disease left. 3. Diabetes mellitus type 2 uncontrolled 4. History of tobacco use disorder, quit recently 5. Hypertension 6. Noncompliance with medical advice 7. Acute renal failure now improving serum creatinine, due to dehydration. Patient admitted with altered mental status.  Recommendation: No new symptoms or concerns. Left foot is still warm to touch since being on Heparin gtt, although still extremely painful with absent pulses.  Scheduled for PV angiogram tomorrow with Dr. Randa Spike, NP-C 11/04/2014, 8:39 AM [facility] Pager: [phone] Office: [phone]

## 2014-11-04 NOTE — Care Management Important Message (Signed)
Important Message  Patient Details  Name: Carmen [name] MRN: [id number] Date of Birth: 1933-11-08   Medicare Important Message Given:  Yes-third notification given    Delorse Lek 11/04/2014, 11:03 AM

## 2014-11-05 ENCOUNTER — Encounter (HOSPITAL_COMMUNITY)
Admission: EM | Disposition: A | Discharge status: Skilled Nursing Facility | Payer: Medicare Other | Source: Home / Self Care | Attending: Internal Medicine

## 2014-11-05 DIAGNOSIS — I70222 Atherosclerosis of native arteries of extremities with rest pain, left leg: Secondary | ICD-10-CM

## 2014-11-05 HISTORY — PX: PERIPHERAL VASCULAR CATHETERIZATION: SHX172C

## 2014-11-05 LAB — GLUCOSE, CAPILLARY
GLUCOSE-CAPILLARY: 99 mg/dL (ref 65–99)
GLUCOSE-CAPILLARY: 121 mg/dL — AB (ref 65–99)
GLUCOSE-CAPILLARY: 233 mg/dL — AB (ref 65–99)
Glucose-Capillary: 152 mg/dL — ABNORMAL HIGH (ref 65–99)

## 2014-11-05 LAB — BASIC METABOLIC PANEL
ANION GAP: 6 (ref 5–15)
BUN: 20 mg/dL (ref 6–20)
CALCIUM: 8.4 mg/dL — AB (ref 8.9–10.3)
CO2: 23 mmol/L (ref 22–32)
Chloride: 104 mmol/L (ref 101–111)
Creatinine, Ser: 1.28 mg/dL — ABNORMAL HIGH (ref 0.44–1.00)
GFR, EST AFRICAN AMERICAN: 45 mL/min — AB (ref >60)
GFR, EST NON AFRICAN AMERICAN: 39 mL/min — AB (ref >60)
GLUCOSE: 174 mg/dL — AB (ref 65–99)
Potassium: 4.4 mmol/L (ref 3.5–5.1)
Sodium: 133 mmol/L — ABNORMAL LOW (ref 135–145)

## 2014-11-05 LAB — CBC
HCT: 32.1 % — ABNORMAL LOW (ref 36.0–46.0)
Hemoglobin: 10.6 g/dL — ABNORMAL LOW (ref 12.0–15.0)
MCH: 31.1 pg (ref 26.0–34.0)
MCHC: 33 g/dL (ref 30.0–36.0)
MCV: 94.1 fL (ref 78.0–100.0)
PLATELETS: 175 10*3/uL (ref 150–400)
RBC: 3.41 MIL/uL — AB (ref 3.87–5.11)
RDW: 13.7 % (ref 11.5–15.5)
WBC: 5.3 10*3/uL (ref 4.0–10.5)

## 2014-11-05 LAB — HEPARIN LEVEL (UNFRACTIONATED): HEPARIN UNFRACTIONATED: 0.54 [IU]/mL (ref 0.30–0.70)

## 2014-11-05 LAB — MRSA PCR SCREENING: MRSA BY PCR: NEGATIVE

## 2014-11-05 LAB — POCT ACTIVATED CLOTTING TIME
ACTIVATED CLOTTING TIME: 245 s
Activated Clotting Time: 239 seconds
Activated Clotting Time: 140 seconds

## 2014-11-05 SURGERY — ABDOMINAL AORTOGRAM
Anesthesia: LOCAL

## 2014-11-05 MED ORDER — HYDRALAZINE HCL 20 MG/ML IJ SOLN
INTRAMUSCULAR | Status: DC | PRN
  Administered 2014-11-05 (×2): 10 mg via INTRAVENOUS

## 2014-11-05 MED ORDER — GUAIFENESIN-DM 100-10 MG/5ML PO SYRP: 15.0000 mL | ORAL_SOLUTION | ORAL | Status: DC | PRN

## 2014-11-05 MED ORDER — DOCUSATE SODIUM 100 MG PO CAPS: 100.0000 mg | ORAL_CAPSULE | Freq: Every day | ORAL | Status: DC

## 2014-11-05 MED ORDER — HEPARIN SODIUM (PORCINE) 1000 UNIT/ML IJ SOLN
INTRAMUSCULAR | Status: DC | PRN
  Administered 2014-11-05: 1000 [IU] via INTRAVENOUS
  Administered 2014-11-05: 5000 [IU] via INTRAVENOUS

## 2014-11-05 MED ORDER — SODIUM CHLORIDE 0.9 % IV SOLN
INTRAVENOUS | Status: DC
  Administered 2014-11-05: 20:00:00 via INTRAVENOUS

## 2014-11-05 MED ORDER — LABETALOL HCL 5 MG/ML IV SOLN: 10.0000 mg | INTRAVENOUS | Status: DC | PRN

## 2014-11-05 MED ORDER — IODIXANOL 320 MG/ML IV SOLN
INTRAVENOUS | Status: DC | PRN
  Administered 2014-11-05: 160 mL via INTRAVENOUS

## 2014-11-05 MED ORDER — ALUM & MAG HYDROXIDE-SIMETH 200-200-20 MG/5ML PO SUSP: 15.0000 mL | ORAL | Status: DC | PRN

## 2014-11-05 MED ORDER — METOPROLOL TARTRATE 1 MG/ML IV SOLN
2.0000 mg | INTRAVENOUS | Status: DC | PRN
Start: 2014-11-05 — End: 2014-11-07

## 2014-11-05 MED ORDER — LIDOCAINE HCL (PF) 1 % IJ SOLN
INTRAMUSCULAR | Status: AC
  Filled 2014-11-05: qty 30

## 2014-11-05 MED ORDER — SODIUM CHLORIDE 0.9 % IV SOLN: INTRAVENOUS | Status: DC

## 2014-11-05 MED ORDER — HYDRALAZINE HCL 20 MG/ML IJ SOLN: 5.0000 mg | INTRAMUSCULAR | Status: DC | PRN

## 2014-11-05 MED ORDER — NITROGLYCERIN IN D5W 200-5 MCG/ML-% IV SOLN
INTRAVENOUS | Status: DC | PRN
  Administered 2014-11-05: 10 ug/min via INTRAVENOUS

## 2014-11-05 MED ORDER — HYDRALAZINE HCL 20 MG/ML IJ SOLN
INTRAMUSCULAR | Status: AC
  Filled 2014-11-05: qty 1

## 2014-11-05 MED ORDER — ATROPINE SULFATE 0.1 MG/ML IJ SOLN
INTRAMUSCULAR | Status: AC
  Filled 2014-11-05: qty 10

## 2014-11-05 MED ORDER — PHENOL 1.4 % MT LIQD: 1.0000 | OROMUCOSAL | Status: DC | PRN

## 2014-11-05 MED ORDER — HEPARIN SODIUM (PORCINE) 1000 UNIT/ML IJ SOLN
INTRAMUSCULAR | Status: AC
  Filled 2014-11-05: qty 1

## 2014-11-05 MED ORDER — ACETAMINOPHEN 325 MG PO TABS
325.0000 mg | ORAL_TABLET | ORAL | Status: DC | PRN
  Administered 2014-11-06: 650 mg via ORAL
  Filled 2014-11-05: qty 2

## 2014-11-05 MED ORDER — ACETAMINOPHEN 650 MG RE SUPP: 325.0000 mg | RECTAL | Status: DC | PRN

## 2014-11-05 MED ORDER — HEPARIN (PORCINE) IN NACL 2-0.9 UNIT/ML-% IJ SOLN
INTRAMUSCULAR | Status: AC
  Filled 2014-11-05: qty 1000

## 2014-11-05 MED ORDER — NITROPRUSSIDE SODIUM 25 MG/ML IV SOLN
0.0000 ug/kg/min | INTRAVENOUS | Status: DC
  Administered 2014-11-05: 0.3 ug/kg/min via INTRAVENOUS
  Filled 2014-11-05 (×2): qty 2

## 2014-11-05 MED ORDER — ONDANSETRON HCL 4 MG/2ML IJ SOLN: 4.0000 mg | Freq: Four times a day (QID) | INTRAMUSCULAR | Status: DC | PRN

## 2014-11-05 MED ORDER — SODIUM CHLORIDE 0.9 % IV SOLN
INTRAVENOUS | Status: DC
  Administered 2014-11-05: 12:00:00 via INTRAVENOUS

## 2014-11-05 SURGICAL SUPPLY — 30 items
BALLN ANGIOSCULPT OTW 5.0X20 (BALLOONS) ×2
BALLN CHOCOLATE 3.5X40X135 (BALLOONS) ×2
BALLN LUTONIX DCB 5X40X130 (BALLOONS) ×2
BALLOON ANGIOSCULPT OTW 5.0X20 (BALLOONS) ×1 IMPLANT
BALLOON CHOCOLATE 3.5X40X135 (BALLOONS) IMPLANT
BALLOON LUTONIX DCB 5X40X130 (BALLOONS) IMPLANT
CATH OMNI FLUSH 5F 65CM (CATHETERS) ×1 IMPLANT
CATH QUICKCROSS .035X135CM (MICROCATHETER) ×1 IMPLANT
CATH SOFT-VU 4F 65 STRAIGHT (CATHETERS) IMPLANT
CATH SOFT-VU STRAIGHT 4F 65CM (CATHETERS) ×2
COVER PRB 48X5XTLSCP FOLD TPE (BAG) IMPLANT
COVER PROBE 5X48 (BAG) ×2
DEVICE TORQUE H2O (MISCELLANEOUS) ×1 IMPLANT
DRAPE ZERO GRAVITY STERILE (DRAPES) ×2 IMPLANT
GUIDEWIRE ANGLED .035X150CM (WIRE) ×1 IMPLANT
GUIDEWIRE ANGLED .035X260CM (WIRE) ×1 IMPLANT
KIT ENCORE 26 ADVANTAGE (KITS) ×1 IMPLANT
KIT MICROINTRODUCER STIFF 5F (SHEATH) ×2 IMPLANT
KIT PV (KITS) ×2 IMPLANT
SHEATH HIGHFLEX ANSEL 7FR 55CM (SHEATH) ×2 IMPLANT
SHEATH PINNACLE 5F 10CM (SHEATH) ×1 IMPLANT
SHIELD RADPAD SCOOP 12X17 (MISCELLANEOUS) ×1 IMPLANT
SYR MEDRAD MARK V 150ML (SYRINGE) ×2 IMPLANT
TAPE RADIOPAQUE TURBO (MISCELLANEOUS) ×2 IMPLANT
TRANSDUCER W/STOPCOCK (MISCELLANEOUS) ×2 IMPLANT
TRAY PV CATH (CUSTOM PROCEDURE TRAY) ×2 IMPLANT
WIRE AMPLATZ SS-J .035X180CM (WIRE) ×1 IMPLANT
WIRE BENTSON .035X145CM (WIRE) ×2 IMPLANT
WIRE ROSEN-J .035X180CM (WIRE) ×1 IMPLANT
WIRE SPARTACORE .014X300CM (WIRE) ×1 IMPLANT

## 2014-11-05 NOTE — Progress Notes (Signed)
[facility] PROGRESS NOTE  Carmen [name] URK:[id number] DOB: Mar 11, 1933 DOA: 10/28/2014 PCP: Maximino Greenland, MD  HPI/Brief narrative 82 year old female with history of HTN, IDDM/DM 2, CVA with right upper extremity contracture, CKD3, chronic low back pain, CAD, PAD, PPM, history of intermittent left leg pain for the last 3-4 months which has progressively worsened in the last week, seen at [facility] ED on 8/21 at which time assessed to have radiculopathy related pain-EDP discussed with Dr. Einar Gip stated that she has chronically cool left extremity without palpable or dopplerable distal pulses. She was given pain medications and discharged home but returned and admitted to the [facility] on 10/28/14 due to confusion and persistent left leg pain. She was found with acute renal failure, hyperkalemia and hematuria. Acute renal failure and hyperkalemia have resolved. Patient has acute on chronic left lower extremity limb threatening ischemia. Dr. Einar Gip has consulted and plans peripheral arteriogram on 8/30. He recommended transferring patient to [facility] in case she requires emergent intervention or for elective procedure. Patient remained at [facility] remained relatively stable and patient for arteriogram today.   Assessment/Plan: Acute on chronic left lower extremity limb threatening ischemia/Left leg pain  - No reported trauma. Picture not consistent with peripheral neuropathy or radiculopathy pain. X-ray negative for fractures. Lower extremity venous Dopplers negative.  - Lower extremity arterial Doppler results as below.  - Dr. Einar Gip consultation appreciated and Dr Algis Liming discussed with him  11/01/2014. He had discussed extensively with patient and family regarding high risk procedure/peripheral arteriogram. Patient has severe rest pain not relieved with pain medications. So as to improve lifestyle and quality, family has opted to proceed with peripheral arteriogram being aware of all risks.  -  IV heparin started per pharmacy. Patient with some slightly tinged urine which is pink and some scant vaginal bleeding however hemoglobin has remained stable.. Will monitor closely if increased bleeding will likely need to discontinue IV heparin. - Per Vascular/Dr Einar Gip. Continue current pain management.  Patient scheduled for arteriogram today per Dr. Gorden Harms. May need to consider discontinuing IV heparin postprocedure patient continues bleeding.  Acute on stage III chronic kidney disease  - Baseline creatinine probably in the 1.2-1.5 range. Admitted with creatinine of 2.3.  - Most likely related to poor oral intake and dehydration.  - No hydronephrosis by CT renal stone study.  - Hydrated with IV fluids, creatinine now at baseline.  - Acute kidney injury resolved.  - We will continue gentle IV fluid hydration in preparation for peripheral arteriogram today 11/05/2014. Follow BMP.   Hyperkalemia  - Secondary to acute on chronic kidney disease. Also on ACEI/lisinopril and potassium supplements at home-held.  - Provided a dose of Kayexalate 8/24.  - ? RTA 4 also a possibility.  - Resolved.   Metabolic acidosis Improved. Discontinued bicarbonate drip.  Hematuria  - Microscopic hematuria. CT renal stone study: No stone. UTI-urine culture negative. Status post 3 days IV Rocephin. Consider outpatient consultation with urology.   Urinary retention Status post Foley catheter placement. Continue Flomax. Will likely need a voiding trial in a few days prior to discharge.   Acute encephalopathy - Secondary to metabolic derangement (acute renal failure) and opioids. Resolved. - CT head without acute findings.  Uncontrolled type II DM/IDDM/hypoglycemia - Hypoglycemic episodes with CBG of 41 on 11/01/2014. CBG 59 on 11/02/2014. D/C'd 70/30. CBGs ranging from 99-152. Monitor closely. SSI.  Essential hypertension - uncontrolled. BP improved. Continue metoprolol 75 mg twice daily.  Continue Imdur and hydralazine. Holding  ACEI secondary to acute renal failure.   PAD status post bilateral iliac stenting Management as above.  CVA with residual R UE contracture - Continue aspirin and Plavix for secondary stroke prevention postoperatively when okay with Dr. Bertrum Sol surgery. PT/OT.  Failure to thrive  Fecal impaction - Continue bowel regimen.   S/P PPM   DVT prophylaxis: On IV heparin drip.    Code Status: Full Family Communication: Updated patient and family at bedside. Disposition Plan: Likely skilled nursing facility when medically stable.   Consultants:  Vascular/Dr. Einar Gip  Procedures:  Bilateral lower extremity arterial duplex 10/31/14.  Preliminary report: Duplex scan of the lower extremity arterial system reveal moderate calcific and non calcific plaque throughout the right and moderate to severe calcific and non calcific plaque Through out the left. Doppler waveforms on the right were biphasic in the common femoral artery diminishing to dampened monophasic in the distal tibial arteries. The Doppler waveforms on the left were biphasic in the proximal common femoral artery diminishing to monophasic into the popliteal. The Doppler waveforms throughout the tibial arteries diminished to dampened monophasic with severelydiminishing velocities in the distal region. There is no evidence of a total occlusion.  CT head 10/28/2014 Plain films of the left tib-fib 10/29/2014  lower extremity venous Dopplers  Antibiotics:  IV Rocephin 10/28/2014>>>> 10/30/2014  HPI/Subjective: Patient with pink color urine. Per nursing patient also with some vaginal bleeding however somewhat scant. Patient denies any chest pain. No shortness of breath. Patient still with left lower extremity exquisite tenderness.   Objective: Filed Vitals:   11/05/14 1316  BP: 152/49  Pulse: 61  Temp: 98.8 F (37.1 C)  Resp: 18    Intake/Output Summary (Last 24 hours) at  11/05/14 1521 Last data filed at 11/05/14 1400  Gross per 24 hour  Intake    715 ml  Output   1300 ml  Net   -585 ml   Filed Weights   10/28/14 1632 11/01/14 2039 11/05/14 0500  Weight: 42.4 kg (93 lb 7.6 oz) 49.1 kg (108 lb 3.9 oz) 51.4 kg (113 lb 5.1 oz)    Exam:   General:  Frail elderly female in no acute cardiopulmonary distress.  Cardiovascular: RRR  Respiratory: CTAB.  Abdomen: Soft, nontender, nondistended, positive bowel sounds.  Musculoskeletal: No clubbing cyanosis or edema. Left lower extremity exquisitely tender to palpation. unable to palpate a pulse in the left foot.  Data Reviewed: Basic Metabolic Panel:  Recent Labs Lab 10/31/14 0517 11/01/14 0429 11/03/14 0500 11/04/14 0256 11/05/14 0308  NA 142 138 131* 133* 133*  K 4.2 5.0 5.1 4.9 4.4  CL 114* 113* 107 105 104  CO2 21* 18* 18* 22 23  GLUCOSE 40* 269* 114* 141* 174*  BUN 44* 33* 21* 20 20  CREATININE 1.33* 1.41* 1.19* 1.24* 1.28*  CALCIUM 8.5* 8.5* 8.1* 8.3* 8.4*   Liver Function Tests: No results for input(s): AST, ALT, ALKPHOS, BILITOT, PROT, ALBUMIN in the last 168 hours. No results for input(s): LIPASE, AMYLASE in the last 168 hours. No results for input(s): AMMONIA in the last 168 hours. CBC:  Recent Labs Lab 11/01/14 0429 11/03/14 0500 11/04/14 0256 11/05/14 0308  WBC 5.4 5.2 4.8 5.3  HGB 12.0 11.4* 10.5* 10.6*  HCT 36.7 34.2* 31.4* 32.1*  MCV 98.7 96.9 94.3 94.1  PLT 149* 135* 161 175   Cardiac Enzymes: No results for input(s): CKTOTAL, CKMB, CKMBINDEX, TROPONINI in the last 168 hours. BNP (last 3 results)  Recent Labs  09/11/14 1522  BNP 3381.1*    ProBNP (last 3 results) No results for input(s): PROBNP in the last 8760 hours.  CBG:  Recent Labs Lab 11/04/14 1658 11/04/14 2147 11/05/14 0739 11/05/14 1115 11/05/14 1438  GLUCAP 257* 124* 152* 99 121*    Recent Results (from the past 240 hour(s))  Urine culture     Status: None   Collection Time: 10/28/14   1:56 PM  Result Value Ref Range Status   Specimen Description URINE, CATHETERIZED  Final   Special Requests NONE  Final   Culture   Final    NO GROWTH 1 DAY Performed at [facility]    Report Status 10/30/2014 FINAL  Final  Culture, Urine     Status: None (Preliminary result)   Collection Time: 11/04/14  7:23 PM  Result Value Ref Range Status   Specimen Description URINE, RANDOM  Final   Special Requests NONE  Final   Culture TOO YOUNG TO READ  Final   Report Status PENDING  Incomplete     Studies: No results found.  Scheduled Meds: . antiseptic oral rinse  7 mL Mouth Rinse BID  . aspirin EC  81 mg Oral Daily  . cefUROXime (ZINACEF)  IV  1.5 g Intravenous On Call to OR  . clopidogrel  75 mg Oral Daily  . feeding supplement  1 Container Oral BID BM  . hydrALAZINE  100 mg Oral QID  . insulin aspart  0-9 Units Subcutaneous TID WC  . isosorbide mononitrate  120 mg Oral Daily  . metoprolol tartrate  75 mg Oral BID  . omega-3 acid ethyl esters  1 g Oral BID  . polyethylene glycol  17 g Oral BID  . senna-docusate  2 tablet Oral BID  . tamsulosin  0.4 mg Oral QPC supper   Continuous Infusions: . sodium chloride 125 mL/hr at 11/05/14 1200  . heparin 1,000 Units/hr (11/05/14 0920)    Principal Problem:   Lower limb ischemia; LLE Active Problems:   Peripheral neuropathy   History of CVA (cerebrovascular accident) with associated mild right upper extremity hemiplegia   Pacemaker-[employer]   Type 2 diabetes mellitus with hyperosmolar nonketotic hyperglycemia   CKD (chronic kidney disease), stage III   HTN (hypertension)   Renal failure (ARF), acute on chronic   Altered mental status   Confusion   Urinary retention   Hyperkalemia   Acute renal failure syndrome   Hematuria   Acute encephalopathy    Time spent: 26 minutes    Hakop Humbarger M.D. [facility] Pager [phone]. If 7PM-7AM, please contact night-coverage at [email or web address], password  [facility] 11/05/2014, 3:21 PM  LOS: 8 days

## 2014-11-05 NOTE — Progress Notes (Signed)
ANTICOAGULATION CONSULT NOTE - F/u Consult  Pharmacy Consult for heparin Indication: limb-threatening ischemia  Allergies  Allergen Reactions  . Peanuts [Peanut Oil] Swelling  . Norvasc [Amlodipine Besylate] Swelling  . Peanut-Containing Drug Products Other (See Comments)    Cause my stomach to hurt    Patient Measurements: Height: 5\' 4"  (162.6 cm) Weight: 113 lb 5.1 oz (51.4 kg) IBW/kg (Calculated) : 54.7  Vital Signs: Temp: 98.9 F (37.2 C) (08/30 0601) Temp Source: Oral (08/30 0601) BP: 184/62 mmHg (08/30 1013) Pulse Rate: 60 (08/30 1013)  Labs:  Recent Labs  11/03/14 0500 11/03/14 1359 11/04/14 0256 11/05/14 0308  HGB 11.4*  --  10.5* 10.6*  HCT 34.2*  --  31.4* 32.1*  PLT 135*  --  161 175  HEPARINUNFRC 0.45 0.68 0.45 0.54  CREATININE 1.19*  --  1.24* 1.28*    Estimated Creatinine Clearance: 29.4 mL/min (by C-G formula based on Cr of 1.28).  Assessment:  CC: L leg pain, confusion  PMH: back pain, CVA, migraine, OA, HTN, CAD, PVD, anxiety, DM  AC: Heparin for limb ischemia, very painful - HL 0.54, Hgb 11.4>10.6, Plts 175 ok. - Scheduled for PV angiogram tomorrow. UA was red with large Hgb   Goal of Therapy: Heparin level 0.3-0.7 units/ml Monitor platelets by anticoagulation protocol: Yes  Plan: Continue IV heparin at 1000 units/hr Daily HL and CBC    Arabelle Bollig S. Kallal Highland, PharmD, [facility] Clinical Staff Pharmacist Pager [phone]   11/05/2014 12:56 PM

## 2014-11-05 NOTE — Progress Notes (Signed)
Right femoral artery sheath pulled at 2222 without difficulty. Pressure held for a total of 30 minutes until 2252 at which time a pressure dressing was applied. Vital signs and distal pulses remained stable throughout procedure. EBL 9mL. Site currently level 0 and patient resting comfortably with no complaints of pain. Patient given instructions regarding bedrest and verbalized understanding. Sheath pull observed by Duanne Moron, RN. Johnsie Cancel, RN

## 2014-11-05 NOTE — Op Note (Signed)
Patient name: Carmen [name] MRN: [id number] DOB: 06-30-1933 Sex: female  10/28/2014 - 11/05/2014 Pre-operative Diagnosis: Left extremity rest pain Post-operative diagnosis:  Same Surgeon:  Annamarie Major Procedure Performed:  1.  Ultrasound-guided access right femoral artery  2.  Abdominal aortogram  3.  Left lower extremity runoff  4.  Drug coated balloon angioplasty, left superficial femoral artery  5.  Balloon angioplasty, left popliteal artery (below the knee)  6.  Follow-up imaging     Indications:  Patient has left lower extremity rest pain.  Doppler studies identified diffuse disease.  She is here for further evaluation and possible intervention  Findings: The patient had a short segment occlusion of the left superficial femoral artery in the midportion.  This was successfully crossed and treated with a drug coated balloon angioplasty, 5 x 40.  This was preceded by pretreatment using a Angiosculpt 5 x 20 balloon.  Residual stenosis was less than 10% There was a second lesion in the below knee popliteal artery with a 99% stenosis.  This was treated initially with a Angiosculpt 5 x 20 balloon.  There was a post angioplasty non-flow-limiting dissection and therefore a Chocolate 3.5 x 40 balloon was inflated with resolution of the dissection.  Residual stenosis was less than 15%.  Procedure:  The patient was identified in the holding area and taken to room 8.  The patient was then placed supine on the table and prepped and draped in the usual sterile fashion.  A time out was called.  Ultrasound was used to evaluate the right common femoral artery.  It was patent .  A digital ultrasound image was acquired.  A micropuncture needle was used to access the right common femoral artery under ultrasound guidance.  An 018 wire was advanced without resistance and a micropuncture sheath was placed.  The 018 wire was removed and a benson wire was placed.  The micropuncture sheath was exchanged for a 5  french sheath.  An omniflush catheter was advanced over the wire to the level of L-1.  An abdominal angiogram was obtained.  Next, using the omniflush catheter and a benson wire, the aortic bifurcation was crossed and the catheter was placed into theleft external iliac artery and left runoff was obtained.   Findings:   Aortogram:  No significant renal artery stenosis is identified.  The infrarenal abdominal aorta is widely patent.  A stent within the proximal left common iliac artery is widely patent as is the left external iliac artery.  The right common iliac artery is widely patent.  The stent within the right external iliac and the remaining portions of the external iliac artery are widely patent.  Right Lower Extremity:  Not evaluated  Left Lower Extremity:  The left common femoral and superficial femoral artery are widely patent.  Superficial femoral artery is patent proximally.  There is a short segment stenosis in the midportion with reconstitution of the distal superficial femoral artery.  The above-knee popliteal artery is widely patent.  The below knee popliteal artery has a near total occlusion.  The dominant runoff is the peroneal artery.  The anterior tibial and posterior tibial artery are occluded proximally.  There is reconstitution the posterior tibial artery which crosses the ankle.  Intervention:  After the above images were acquired, the decision was made to proceed with intervention.  A Amplatz superstiff wire was advanced into the profunda femoral artery on the left.  I selected a 7 x 55 high flex Ansel 1  sheath.  This was advanced over the wire into the external iliac artery on the left.  The patient was fully heparinized.  I used a 17 Glidewire and a quick cross catheter to cross both lesions in the mid superficial femoral artery as well as the below knee popliteal artery.  A contrast injection was performed after crossing each lesion to confirm that the catheter was intraluminal.   Next, a 014 wire was inserted.  I selected a Angiosculpt 5 x 20 balloon to perform balloon angioplasty of the short segment occlusion in the mid superficial femoral artery as well as in the stenosis of the below knee popliteal artery.  This was followed by drug coated Lutonix 5 x 40 balloon in the mid superficial femoral stenosis.  This was taken to nominal pressure for 2 minutes.  A completion imaging was performed which showed resolution of the stenosis within the superficial femoral artery.  There was a nonflow limiting dissection at the below knee popliteal stenosis.  I then selected a chocolate 3.5 x 40 balloon and repeated the balloon angioplasty in the below knee popliteal artery.  This was held for 2 minutes.  Follow-up imaging revealed resolution of the stenosis and no evidence of dissection with residual stenosis less than 15%.  At this point completion imaging confirmed no change in the runoff.  Catheters and wires were removed.  The sheath was withdrawn to the right external iliac artery.  The patient was taken the holding area for sheath pull once her platelets are profile corrects.  Impression:  #1  short segment occlusion in the mid left superficial femoral artery successfully crossed and treated using a scoring balloon followed by a 5 x 40 drug coated balloon.  #2  99% stenosis within the below knee popliteal artery.  This was successfully treated with a scoring balloon, 5 x 20.  There was a post intervention nonflow limiting dissection which was then intervened using a chocolate 3.5 x 40 balloon.  After this inflation there was a resolution of the dissection.  #3  single vessel runoff via the peroneal artery    V. Annamarie Major, M.D. [facility] Office: [phone] Pager:  [phone]

## 2014-11-05 NOTE — H&P (Signed)
Consult Note  Patient name: Carmen [name] MRN: [id number] DOB: May 31, 1933 Sex: female  Consulting Physician:  Dr. Einar Gip  Reason for Consult:  Chief Complaint  Patient presents with  . Altered Mental Status  . Drug Overdose    HISTORY OF PRESENT ILLNESS: This is a 81 year old female that I was asked to evaluate by Dr. Einar Gip for evaluation of peripheral vascular disease and rest pain to her left leg.  She has a history of iliac stenting in 2011 and stenting to a right subclavian artery and 2010.  Patient has a history of a stroke which is left her with residual right-sided weakness.  She has a history significant for coronary artery disease she has a pacemaker in place.  She is an insulin-dependent diabetic.  She is medically managed for hypertension on multiple medications including an ACE inhibitor.  She is not on a statin.  Past Medical History  Diagnosis Date  . Hypertension   . Coronary artery disease   . Renal disorder   . Herniated lumbar intervertebral disc   . Cataract     "both eyes; blood pressure's always too high to have them fixed" (09/11/2014)  . Renovascular hypertension      s/p left renal artery stent 12/2007.  S/P balloon angioplasty on 02/16/10 for ISR, BP was  controlled well since then.     . Claudication in peripheral vascular disease     02/16/10: Left CIA 9.0x28 Omnilink and REIA 8.0x40 seff expanding Zilver.   right subclavian artery stent 03/18/2008,  . Peripheral vascular disease   . Anxiety   . Pacemaker   . Pneumonia X 2  . IDDM (insulin dependent diabetes mellitus)   . Migraine     "used to have terrible migraines; stopped in the 1990's"  . Stroke 1999    Stroke and TIA in 1999 with right sided weakness, now with residual right arm weakness (09/11/2014)  . Arthritis     "everywhere"  . Chronic lower back pain     Past Surgical History  Procedure Laterality Date  . Appendectomy    . Ureteral stent placement    . Carotid endarterectomy  Left 1991     Stroke and TIA in 1999 with right sided weakness, now with residual right arm weakness  . Lumbar laminectomy      'herniated disc"  . Back surgery    . Colonoscopy  07/30/2011    Procedure: COLONOSCOPY;  Surgeon: Inda Castle, MD;  Location: [place];  Service: Endoscopy;  Laterality: N/A;  gi bleed  . Esophagogastroduodenoscopy  07/30/2011    Procedure: ESOPHAGOGASTRODUODENOSCOPY (EGD);  Surgeon: Inda Castle, MD;  Location: [place];  Service: Endoscopy;  Laterality: N/A;  . Permanent pacemaker insertion Left 06/28/2011    Procedure: PERMANENT PACEMAKER INSERTION;  Surgeon: Deboraha Sprang, MD;  Location: [facility] CATH LAB;  Service: Cardiovascular;  Laterality: Left;  . Abdominal angiogram N/A 07/06/2011    Procedure: ABDOMINAL ANGIOGRAM;  Surgeon: Laverda Page, MD;  Location: [facility] CATH LAB;  Service: Cardiovascular;  Laterality: N/A;  . Renal angiogram N/A 07/06/2011    Procedure: RENAL ANGIOGRAM;  Surgeon: Laverda Page, MD;  Location: [facility] CATH LAB;  Service: Cardiovascular;  Laterality: N/A;  . Lower extremity angiogram Right 05/29/2012    Procedure: LOWER EXTREMITY ANGIOGRAM;  Surgeon: Laverda Page, MD;  Location: [facility] CATH LAB;  Service: Cardiovascular;  Laterality: Right;  . Tonsillectomy    . Insert /  replace / remove pacemaker    . Excisional hemorrhoidectomy    . Carpal tunnel release Left   . Vaginal hysterectomy      Social History   Social History  . Marital Status: Married    Spouse Name: N/A  . Number of Children: N/A  . Years of Education: N/A   Occupational History  . Not on file.   Social History Main Topics  . Smoking status: Current Every Day Smoker -- 0.00 packs/day for 70 years    Types: Cigarettes  . Smokeless tobacco: Never Used     Comment: 09/11/2014 "I used to smoke very heavy; down to 1 cigarette/day"  . Alcohol Use: Yes     Comment: "stopped drinking back in the 1970's"  . Drug Use: No  . Sexual Activity: No   Other  Topics Concern  . Not on file   Social History Narrative    Family History  Problem Relation Age of Onset  . Cancer Father   . Heart attack Mother     Allergies as of 10/28/2014 - Review Complete 10/28/2014  Allergen Reaction Noted  . Peanuts [peanut oil] Swelling 06/03/2013  . Norvasc [amlodipine besylate] Swelling 04/03/2011  . Peanut-containing drug products Other (See Comments) 07/20/2011    No current facility-administered medications on file prior to encounter.   Current Outpatient Prescriptions on File Prior to Encounter  Medication Sig Dispense Refill  . aspirin EC 81 MG tablet Take 81 mg by mouth daily.    . benazepril (LOTENSIN) 40 MG tablet Take 40 mg by mouth daily.    . clopidogrel (PLAVIX) 75 MG tablet Take 75 mg by mouth daily.    . diazepam (VALIUM) 5 MG tablet Take 1 tablet (5 mg total) by mouth 2 (two) times daily. 15 tablet 0  . furosemide (LASIX) 20 MG tablet Take 2 tablets (40 mg total) by mouth daily. (Patient taking differently: Take 20 mg by mouth 2 (two) times daily. ) 30 tablet 3  . hydrALAZINE (APRESOLINE) 100 MG tablet Take 1 tablet (100 mg total) by mouth 3 (three) times daily. (Patient taking differently: Take 100 mg by mouth 4 (four) times daily. ) 120 tablet 4  . insulin NPH-regular Human (NOVOLIN 70/30) (70-30) 100 UNIT/ML injection Inject 8 Units into the skin 2 (two) times daily with a meal. 10 mL 11  . isosorbide mononitrate (IMDUR) 120 MG 24 hr tablet Take 1 tablet (120 mg total) by mouth daily. 30 tablet 0  . lisinopril (ZESTRIL) 10 MG tablet Take 1 tablet (10 mg total) by mouth daily. 30 tablet 0  . metoprolol tartrate (LOPRESSOR) 25 MG tablet Take 1 tablet (25 mg total) by mouth 2 (two) times daily. 60 tablet 4  . Omega-3 Fatty Acids (FISH OIL PEARLS) 300 MG CAPS Take 1 tablet by mouth 2 (two) times daily.    Marland Kitchen oxyCODONE-acetaminophen (PERCOCET/ROXICET) 5-325 MG per tablet Take 2 tablets by mouth every 6 (six) hours as needed for severe pain.  15 tablet 0  . potassium chloride SA (K-DUR,KLOR-CON) 10 MEQ tablet Take 1 tablet (10 mEq total) by mouth daily. 30 tablet 1  . [DISCONTINUED] aliskiren (TEKTURNA) 150 MG tablet Take 150 mg by mouth daily.    . [DISCONTINUED] gabapentin (NEURONTIN) 100 MG capsule Take 1 capsule (100 mg total) by mouth 3 (three) times daily. 90 capsule 0     REVIEW OF SYSTEMS: Cardiovascular: No chest pain, chest pressure, palpitations, orthopnea, or dyspnea on exertion. No claudication or rest pain,  No history of DVT or phlebitis. Pulmonary: No productive cough, asthma or wheezing. Neurologic: No weakness, paresthesias, aphasia, or amaurosis. No dizziness. Hematologic: No bleeding problems or clotting disorders. Musculoskeletal: No joint pain or joint swelling. Gastrointestinal: No blood in stool or hematemesis Genitourinary: No dysuria or hematuria. Psychiatric:: No history of major depression. Integumentary: No rashes or ulcers. Constitutional: No fever or chills.  PHYSICAL EXAMINATION: General: The patient appears their stated age.  Vital signs are BP 152/49 mmHg  Pulse 61  Temp(Src) 98.8 F (37.1 C) (Oral)  Resp 18  Ht 5\' 4"  (1.626 m)  Wt 113 lb 5.1 oz (51.4 kg)  BMI 19.44 kg/m2  SpO2 97% Pulmonary: Respirations are non-labored HEENT:  No gross abnormalities Abdomen: Soft and non-tender  Musculoskeletal: There are no major deformities.   Neurologic: Right-sided weakness and contracture, Skin: There are no ulcer or rashes noted. Psychiatric: The patient has normal affect. Cardiovascular: There is a regular rate and rhythm without significant murmur appreciated.  Both feet are warm.  Normal motor and function of her left leg  Diagnostic Studies: I have reviewed her duplex studies which show moderate to severe plaque throughout the entire left lower extremity.  Doppler waveforms begin to diminish in the mid femoral artery and continued through the remainder of the leg. On the right there is  irregular plaque throughout  Assessment:  Atherosclerosis with rest pain left leg  Plan: I discussed with the patient proceeding with angiography to define her anatomy.  If she has a lesion amenable to percutaneous intervention we will proceed accordingly.       Eldridge Abrahams, M.D. [facility] [place] Office: [phone] Pager:  [phone]

## 2014-11-05 NOTE — Progress Notes (Signed)
Nutrition Follow-up  DOCUMENTATION CODES:   Underweight, Severe malnutrition in context of acute illness/injury  INTERVENTION:   -Once diet is resumed, continue Boost Breeze po BID, each supplement provides 250 kcal and 9 grams of protein  NUTRITION DIAGNOSIS:   Malnutrition related to acute illness as evidenced by energy intake < or equal to 50% for > or equal to 5 days, percent weight loss.  Ongoing  GOAL:   Patient will meet greater than or equal to 90% of their needs  Progressing  MONITOR:   PO intake, Supplement acceptance, Weight trends, Labs, I & O's  REASON FOR ASSESSMENT:   Malnutrition Screening Tool    ASSESSMENT:   81 year old who was brought to [facility] ED due to confusion. She was seen at [facility] ED due to left leg pain last night and was given pain meds/ valium, this morning patient found more confused than usual, family called the ambulance.  Pt transferred from [facility] to [facility] via [place] on 11/01/14. Pt is currently NPO for peripheral arteriogram today.   Spoke with pt and daughter at bedside. Pt expresses anxiety over her upcoming procedure. Pt daughter reports that appetite has improved from last week. Noted variable meal intake (PO: 01-85%, averaging 25% meal completion). Per pt daughter, diet was liberalized from a renal diet to a CM diet, due to increased food selections. Pt reports she is drinking the Boost Breeze supplements and liked them. RD will continue order.   Reinforced importance of good meal and supplement intake to promote healing. Pt and daughter expressed appreciation for visit.   CSW following. Plan is to transfer back to [facility] once medically stable.   Labs reviewed:  Na: 133 (on IV supplementation).   Diet Order:  Diet NPO time specified Except for: Sips with Meds  Skin:  Reviewed, no issues  Last BM:  11/04/14  Height:   Ht Readings from Last 1 Encounters:  11/01/14 5\' 4"  (1.626 m)    Weight:   Wt Readings from Last 1  Encounters:  11/05/14 113 lb 5.1 oz (51.4 kg)    Ideal Body Weight:  52.27 kg (kg)  BMI:  Body mass index is 19.44 kg/(m^2).  Estimated Nutritional Needs:   Kcal:  1350-1550   Protein:  65-75 grams  Fluid:  >1.4 L  EDUCATION NEEDS:   No education needs identified at this time  Peregrine Nolt A. Jimmye Norman, RD, LDN, CDE Pager: [phone] After hours Pager: [phone]

## 2014-11-05 NOTE — Progress Notes (Signed)
Pt had a large BM this morning; and upon cleaning have noticed a small amount of fresh blood oozing out of her vagina; of note, her pink-tinged urine in her foley yesterday has become more like cranberry-colored; still on Heparin drip at 10 cc/hr; NP on call paged and notified of observation, but no immediate orders given. Will continue to monitor.

## 2014-11-06 ENCOUNTER — Encounter (HOSPITAL_COMMUNITY): Payer: Self-pay | Admitting: Surgery

## 2014-11-06 DIAGNOSIS — I998 Other disorder of circulatory system: Secondary | ICD-10-CM

## 2014-11-06 LAB — URINE CULTURE

## 2014-11-06 LAB — CBC
HEMATOCRIT: 29.1 % — AB (ref 36.0–46.0)
Hemoglobin: 9.7 g/dL — ABNORMAL LOW (ref 12.0–15.0)
MCH: 31.9 pg (ref 26.0–34.0)
MCHC: 33.3 g/dL (ref 30.0–36.0)
MCV: 95.7 fL (ref 78.0–100.0)
Platelets: 201 10*3/uL (ref 150–400)
RBC: 3.04 MIL/uL — AB (ref 3.87–5.11)
RDW: 14.1 % (ref 11.5–15.5)
WBC: 3.9 10*3/uL — AB (ref 4.0–10.5)

## 2014-11-06 LAB — BASIC METABOLIC PANEL
ANION GAP: 7 (ref 5–15)
BUN: 20 mg/dL (ref 6–20)
CALCIUM: 7.9 mg/dL — AB (ref 8.9–10.3)
CO2: 20 mmol/L — AB (ref 22–32)
Chloride: 104 mmol/L (ref 101–111)
Creatinine, Ser: 1.09 mg/dL — ABNORMAL HIGH (ref 0.44–1.00)
GFR calc Af Amer: 55 mL/min — ABNORMAL LOW (ref >60)
GFR, EST NON AFRICAN AMERICAN: 47 mL/min — AB (ref >60)
GLUCOSE: 186 mg/dL — AB (ref 65–99)
Potassium: 4.5 mmol/L (ref 3.5–5.1)
Sodium: 131 mmol/L — ABNORMAL LOW (ref 135–145)

## 2014-11-06 LAB — HEPARIN LEVEL (UNFRACTIONATED)

## 2014-11-06 LAB — GLUCOSE, CAPILLARY
GLUCOSE-CAPILLARY: 238 mg/dL — AB (ref 65–99)
GLUCOSE-CAPILLARY: 333 mg/dL — AB (ref 65–99)
GLUCOSE-CAPILLARY: 125 mg/dL — AB (ref 65–99)
Glucose-Capillary: 154 mg/dL — ABNORMAL HIGH (ref 65–99)
Glucose-Capillary: 137 mg/dL — ABNORMAL HIGH (ref 65–99)

## 2014-11-06 MED FILL — Heparin Sodium (Porcine) 2 Unit/ML in Sodium Chloride 0.9%: INTRAMUSCULAR | Qty: 1000 | Status: AC

## 2014-11-06 MED FILL — Lidocaine HCl Local Preservative Free (PF) Inj 1%: INTRAMUSCULAR | Qty: 30 | Status: AC

## 2014-11-06 NOTE — Progress Notes (Signed)
Subjective:  Pt denies any new symptoms or concerns. Left leg and foot not painful and states pain has improved since PTA.   No discoloration or ulcerations.   Objective:  Vital Signs in the last 24 hours: Temp:  [98 F (36.7 C)-98.8 F (37.1 C)] 98.8 F (37.1 C) (08/31 0330) Pulse Rate:  [57-61] 60 (08/31 0700) Resp:  [10-18] 14 (08/31 0700) BP: (91-193)/(28-64) 177/50 mmHg (08/31 0700) SpO2:  [95 %-100 %] 97 % (08/31 0700) Arterial Line BP: (139-210)/(44-66) 158/54 mmHg (08/30 2221) Weight:  [49.3 kg (108 lb 11 oz)-50.2 kg (110 lb 10.7 oz)] 50.2 kg (110 lb 10.7 oz) (08/31 0500)  Intake/Output from previous day: 08/30 0701 - 08/31 0700 In: 1395.3 [I.V.:1395.3] Out: 1215 [Urine:1215]  Physical Exam: General appearance: alert, cooperative, appears older than stated age, cachectic and no distress Lungs: clear to auscultation bilaterally Heart: regular rate and rhythm, S1, S2 normal, no murmur, click, rub or gallop Abdomen: soft, non-tender; bowel sounds normal; no masses, no organomegaly Extremities: Chronic right upper exudate contracture present. Left approximately normal. Right lower extremity in normal range of movement. Left leg painful to move. Left leg below the knee is now warmer. Severe paresthesia and tenderness on touch present in the left leg. No ulceration. Right lower extremity warm and normal. Neurologic: Grossly normal Vascular exam: Carotid bounding, soft bruit. Femoral artery: cord like. Normal pulses. Bilateral bruit present. Popliteal pulse feeble bilateral. Pedal pulse absent bilateral.  Normal capillary fill.  Right groin access without complications or hematoma.   Lab Results: BMP  Recent Labs  11/04/14 0256 11/05/14 0308 11/06/14 0251  NA 133* 133* 131*  K 4.9 4.4 4.5  CL 105 104 104  CO2 22 23 20*  GLUCOSE 141* 174* 186*  BUN 20 20 20   CREATININE 1.24* 1.28* 1.09*  CALCIUM 8.3* 8.4* 7.9*  GFRNONAA 41* 39* 47*  GFRAA 47* 45* 55*     CBC  Recent Labs Lab 11/06/14 0251  WBC 3.9*  RBC 3.04*  HGB 9.7*  HCT 29.1*  PLT 201  MCV 95.7  MCH 31.9  MCHC 33.3  RDW 14.1    HEMOGLOBIN A1C Lab Results  Component Value Date   HGBA1C 8.7* 09/11/2014   MPG 203 09/11/2014    Cardiac Panel (last 3 results)  Recent Labs  09/11/14 1522 10/28/14 1649  CKTOTAL  --  185  TROPONINI 0.04*  --     Hepatic Function Panel  Recent Labs  09/11/14 1522 10/28/14 1649  PROT 6.7 7.1  ALBUMIN 2.7* 3.9  AST 22 27  ALT 22 36  ALKPHOS 96 62  BILITOT 0.4 0.6   Cardiac Studies:  EKG 10/28/2014:: Normal sinus rhythm, LVH with repolarization abnormality.  Vascular Dopplers: 10/31/2014: Right - Moderate irregular calcific and non calcific plaque noted  throughout the entire lower extremity with areas of acoustic  shadowing.. Doppler waveforms are abnormal. There is no evidence  of significant stenosis, - Left - Moderate to severe irregular calcific and non calcic  plaque noted throughout the entire lower extremity with areas of  acoustic shadowing. Doppler waveforms are abnormal and begin to  diminish to dampened monophasic with diminishing velocities in  the mid femoral artery continuing throughout the remainder of the  leg. there is no evidence of significant stenosis except in the  distal anterior tibial artery which appears occluded  Echo: 04/06/2013: Hyperdynamic left systolic function. EF 65-70%. Grade 1 diastolic dysfunction. Pacemaker wire noted in the right ventricle. Moderate left enlargement. Mild to moderate  pulmonary hypertension, PA pressure 41 mmHg.    Assessment/Plan:  1. Acute limb threatening ischemia left leg- resolved, S/P successful balloon  PTA of the left SFA and left popliteal artery by Dr. Trula Slade, appreciate his help. 2. Peripheral arterial disease in diabetes mellitus. History of PTA and stenting of the right and left iliac arteries in the past, history of right subclavian artery  stent, 2011, 2010 respectively. History of PTA and stenting of the right SFA on 05/29/2012 with 6.0 x 16 mm Zilver self-expanding stent and chocolate balloon angioplasty of the distal right SFA. 3 vessel r/u bilateral legs. Mild disease left. 3. Diabetes mellitus type 2 uncontrolled 4. History of tobacco use disorder, quit recently 5. Hypertension 6. Noncompliance with medical advice 7. Acute renal failure now improving serum creatinine, due to dehydration. Patient admitted with altered mental status.  Recommendation: No new symptoms or concerns. Left leg now non tender to touch. Ambulate and stable from cardiac standpoint for discharge. Continue ASA and plavix. Urinary bleed due to foley trauma. Please call if questions. WIll obtain ABI as OP on f/u in 2-3 weeks.  Adrian Prows, NP-C 11/06/2014, 8:51 AM [facility] Pager: [phone] Office: [phone]

## 2014-11-06 NOTE — Progress Notes (Signed)
    Subjective  - POD #1, s/p PTA and stenting to left leg x2  Reports that her pain in her left leg is much improved after procedure yesterday   Physical Exam:  Left leg not nearly as tender to the touch as pre-procedure Good doppler signals  Right groin canulation site soft without complication       Assessment/Plan:  POD #1  Significant improvement in left leg symptoms following percutaneous intervention. ASA/Plavix Monitor renal function following IV dye from angiogram Follow up care per Dr. Drema Halon, [place] 11/06/2014 7:08 PM --  Filed Vitals:   11/06/14 1903  BP: 131/39  Pulse: 60  Temp:   Resp: 14    Intake/Output Summary (Last 24 hours) at 11/06/14 1908 Last data filed at 11/06/14 1900  Gross per 24 hour  Intake 1134.06 ml  Output    615 ml  Net 519.06 ml     Laboratory CBC    Component Value Date/Time   WBC 3.9* 11/06/2014 0251   HGB 9.7* 11/06/2014 0251   HCT 29.1* 11/06/2014 0251   PLT 201 11/06/2014 0251    BMET    Component Value Date/Time   NA 131* 11/06/2014 0251   K 4.5 11/06/2014 0251   CL 104 11/06/2014 0251   CO2 20* 11/06/2014 0251   GLUCOSE 186* 11/06/2014 0251   BUN 20 11/06/2014 0251   CREATININE 1.09* 11/06/2014 0251   CALCIUM 7.9* 11/06/2014 0251   GFRNONAA 47* 11/06/2014 0251   GFRAA 55* 11/06/2014 0251    COAG Lab Results  Component Value Date   INR 0.96 10/31/2014   INR 1.08 04/06/2013   INR 0.94 05/29/2012   No results found for: PTT  Antibiotics Anti-infectives    Start     Dose/Rate Route Frequency Ordered Stop   11/05/14 0600  cefUROXime (ZINACEF) 1.5 g in dextrose 5 % 50 mL IVPB     1.5 g 100 mL/hr over 30 Minutes Intravenous On call to O.R. 11/04/14 1149 11/06/14 0559   10/29/14 1600  cefTRIAXone (ROCEPHIN) 1 g in dextrose 5 % 50 mL IVPB  Status:  Discontinued     1 g 100 mL/hr over 30 Minutes Intravenous Every 24 hours 10/28/14 1940 10/30/14 1425   10/28/14 1530  cefTRIAXone (ROCEPHIN) 1 g  in dextrose 5 % 50 mL IVPB  Status:  Discontinued     1 g 100 mL/hr over 30 Minutes Intravenous  Once 10/28/14 1518 10/28/14 1630       V. Leia Alf, M.D. [facility] [place] Office: [phone] Pager:  [phone]

## 2014-11-06 NOTE — Clinical Social Work Note (Signed)
Patient transferred to 2H02, verbal handoff given to unit CSW, this CSW to sign off.  Jones Broom. [place], MSW, [place] 11/06/2014 8:50 AM

## 2014-11-06 NOTE — Progress Notes (Signed)
  Progress Note    11/06/2014 8:01 AM 1 Day Post-Op  Subjective:  Left foot pain resolved.  afebrile  Filed Vitals:   11/06/14 0700  BP: 177/50  Pulse: 60  Temp:   Resp: 14    Physical Exam: Extremities:  Left foot is warm with brisk biphasic DP/PT doppler signal with monophasic left peroneal doppler signal.  Right DP is palpable.   CBC    Component Value Date/Time   WBC 3.9* 11/06/2014 0251   RBC 3.04* 11/06/2014 0251   HGB 9.7* 11/06/2014 0251   HCT 29.1* 11/06/2014 0251   PLT 201 11/06/2014 0251   MCV 95.7 11/06/2014 0251   MCH 31.9 11/06/2014 0251   MCHC 33.3 11/06/2014 0251   RDW 14.1 11/06/2014 0251   LYMPHSABS 1.1 10/28/2014 1245   MONOABS 0.2 10/28/2014 1245   EOSABS 0.0 10/28/2014 1245   BASOSABS 0.0 10/28/2014 1245    BMET    Component Value Date/Time   NA 131* 11/06/2014 0251   K 4.5 11/06/2014 0251   CL 104 11/06/2014 0251   CO2 20* 11/06/2014 0251   GLUCOSE 186* 11/06/2014 0251   BUN 20 11/06/2014 0251   CREATININE 1.09* 11/06/2014 0251   CALCIUM 7.9* 11/06/2014 0251   GFRNONAA 47* 11/06/2014 0251   GFRAA 55* 11/06/2014 0251    INR    Component Value Date/Time   INR 0.96 10/31/2014 2118     Intake/Output Summary (Last 24 hours) at 11/06/14 0801 Last data filed at 11/06/14 0700  Gross per 24 hour  Intake 1385.26 ml  Output   1215 ml  Net 170.26 ml     Assessment:  81 y.o. female is s/p:  1. Ultrasound-guided access right femoral artery 2. Abdominal aortogram 3. Left lower extremity runoff 4. Drug coated balloon angioplasty, left superficial femoral artery 5. Balloon angioplasty, left popliteal artery (below the knee) 6. Follow-up imaging  1 Day Post-Op  Plan: -pt doing well this am with resolution of her left foot pain -she has brisk DP/PT biphasic doppler signal and monophasic left peroneal -right groin is soft without hematoma -may advance  diet from vascular surgery standpoint.     Leontine Locket, PA-C [facility] [phone] 11/06/2014 8:01 AM    Agree with above.  See my note above  Annamarie Major

## 2014-11-06 NOTE — Progress Notes (Signed)
Physical Therapy Treatment Patient Details Name: Carmen [name] MRN: [id number] DOB: [birth year] Today's Date: 11/06/2014    History of Present Illness Pt is a 81 year old female admitted for confusion and CKD. LLE ischemia, trfr to [facility], 8/30 angioplasty to Lt SFA and popliteal arteries  PMH:  CVA with Rt UE hemiparesis, CHF, DM, HTN, CAD, back surgery, PVD, pacemaker, PVD, claudication    PT Comments    Pt initially reported no pain (supine, at rest) and reported some pain, but much better with activity. Incontinent of stool x 3 limited ambulation today. (She reports this has not been an issue PTA and RN reports multiple laxatives/stool softeners have been give to "get her going"). Anticipate progression to home with family assist.   Follow Up Recommendations  Supervision/Assistance - 24 hour;Home health PT     Equipment Recommendations  None recommended by PT    Recommendations for Other Services       Precautions / Restrictions Precautions Precautions: Fall Precaution Comments: L LE ischemia/pain Restrictions Weight Bearing Restrictions: No    Mobility  Bed Mobility Overal bed mobility: Needs Assistance Bed Mobility: Supine to Sit     Supine to sit: Supervision     General bed mobility comments: slow and a bit labored  Transfers Overall transfer level: Needs assistance Equipment used: Rolling walker (2 wheeled);1 person hand held assist Transfers: Sit to/from [facility] Sit to Stand: Min assist;Min guard Stand pivot transfers: Min guard       General transfer comment: x 4; very guarded with HHA, much better with RW  Ambulation/Gait Ambulation/Gait assistance: Min assist Ambulation Distance (Feet): 15 Feet (BSC, 15) Assistive device: Rolling walker (2 wheeled) Gait Pattern/deviations: Step-to pattern;Decreased stride length;Shuffle;Trunk flexed   Gait velocity interpretation: Below normal speed for age/gender General Gait Details: limited DF  LLE; pt reports much less pain and only in her shin not foot; assist to maneuver RW and vc for sequence   Stairs            Wheelchair Mobility    Modified Rankin (Stroke Patients Only)       Balance Overall balance assessment: Needs assistance         Standing balance support: Single extremity supported Standing balance-Leahy Scale: Poor Standing balance comment: stood up to 4 minutes for peri-care, obtaining BSC (multiple times prolonged standing with little assist with bil UE support on RW                    Cognition Arousal/Alertness: Awake/alert Behavior During Therapy: [employer] for tasks assessed/performed Overall Cognitive Status: Within Functional Limits for tasks assessed                      Exercises General Exercises - Lower Extremity Ankle Circles/Pumps: AROM;Both;10 reps;Seated    General Comments        Pertinent Vitals/Pain Pain Assessment: Faces Faces Pain Scale: Hurts even more Pain Location: anterior RLE/shin Pain Descriptors / Indicators: Guarding;Grimacing Pain Intervention(s): Limited activity within patient's tolerance;Monitored during session;Repositioned (RN in to assess reddened area)    Home Living                      Prior Function            PT Goals (current goals can now be found in the care plan section) Acute Rehab PT Goals Patient Stated Goal: to be able to go home Time For Goal Achievement: 11/07/14 Progress  towards PT goals: Progressing toward goals    Frequency  Min 3X/week    PT Plan Discharge plan needs to be updated;Equipment recommendations need to be updated    Co-evaluation             End of Session Equipment Utilized During Treatment: Gait belt Activity Tolerance: Treatment limited secondary to medical complications (Comment) (incontinent of stool x 3) Patient left: in chair;with call bell/phone within reach;with nursing/sitter in room;with family/visitor present      Time: 1443-1540 PT Time Calculation (min) (ACUTE ONLY): 42 min  Charges:  $Gait Training: 23-37 mins $Therapeutic Activity: 8-22 mins                    G Codes:      Erine Phenix 11-08-2014, 12:32 PM Pager [phone]

## 2014-11-06 NOTE — Progress Notes (Signed)
Pt has not voided since foley catheter remove at ~1200. Bladder scan showed >350. Pt not feeling urge to void, but placed on BSC. Pt  unable to void. Pt verbalized she wants to try "a little later."  RN will continue to monitor and assess pt closely.

## 2014-11-06 NOTE — Progress Notes (Signed)
[facility] PROGRESS NOTE  Carmen [name] VEH:[id number] DOB: 11/16/1933 DOA: 10/28/2014 PCP: Maximino Greenland, MD  HPI/Brief narrative 81 year old female with history of HTN, IDDM/DM 2, CVA with right upper extremity contracture, CKD3, chronic low back pain, CAD, PAD, PPM, history of intermittent left leg pain for the last 3-4 months which has progressively worsened in the last week, seen at [facility] ED on 8/21 at which time assessed to have radiculopathy related pain-EDP discussed with Dr. Einar Gip stated that she has chronically cool left extremity without palpable or dopplerable distal pulses. She was given pain medications and discharged home but returned and admitted to the [facility] on 10/28/14 due to confusion and persistent left leg pain. She was found with acute renal failure, hyperkalemia and hematuria. Acute renal failure and hyperkalemia have resolved. Patient has acute on chronic left lower extremity limb threatening ischemia. Dr. Einar Gip has consulted and plans peripheral arteriogram on 8/30. He recommended transferring patient to [facility] in case she requires emergent intervention or for elective procedure. Patient remained at [facility] remained relatively stable and patient for arteriogram today.   Assessment/Plan: Acute on chronic left lower extremity limb threatening ischemia/Left leg pain  - No reported trauma. Picture not consistent with peripheral neuropathy or radiculopathy pain. X-ray negative for fractures. Lower extremity venous Dopplers negative.  - Per Vascular/Dr Einar Gip. Continue current pain management.  - Plan is to continue plavix and aspirin. Vascular surgery advancing diet. - start discharge planning, consulting social worker for snf placement.   Acute on stage III chronic kidney disease  - Baseline creatinine probably in the 1.2-1.5 range. Admitted with creatinine of 2.3.  - Most likely related to poor oral intake and dehydration.  - No hydronephrosis by CT  renal stone study.  - resolved with IV fluid rehydration.  Hyperkalemia  - Secondary to acute on chronic kidney disease. Also on ACEI/lisinopril and potassium supplements at home-held.  - Provided a dose of Kayexalate 8/24.  - Resolved.   Metabolic acidosis Improved. Discontinued bicarbonate drip.  Hematuria  - Microscopic hematuria. CT renal stone study: No stone. UTI-urine culture negative. Status post 3 days IV Rocephin. Consider outpatient consultation with urology.  Presumed to be 2ary to traumatic foley placement  Urinary retention Status post Foley catheter placement. Continue Flomax.    Acute encephalopathy - Secondary to metabolic derangement (acute renal failure) and opioids. Resolved. - CT head without acute findings.  Uncontrolled type II DM/IDDM/hypoglycemia - Hypoglycemic episodes with CBG of 41 on 11/01/2014. CBG 59 on 11/02/2014. D/C'd 70/30. CBGs ranging from 99-152. Monitor closely. SSI.  Essential hypertension - uncontrolled. Will try to wean off of Nitroprusside - Pt is currently on imdur, metoprolol, hydralazine  PAD status post bilateral iliac stenting Management as above.  CVA with residual R UE contracture - Continue aspirin and Plavix for secondary stroke prevention postoperatively when okay with Dr. Bertrum Sol surgery. PT/OT.  Failure to thrive  Fecal impaction - Continue bowel regimen.   S/P PPM   DVT prophylaxis: On IV heparin drip.    Code Status: Full Family Communication: discussed directly with patient Disposition Plan: Likely skilled nursing facility when medically stable.   Consultants:  Vascular/Dr. Einar Gip  Procedures:  Bilateral lower extremity arterial duplex 10/31/14.  Preliminary report: Duplex scan of the lower extremity arterial system reveal moderate calcific and non calcific plaque throughout the right and moderate to severe calcific and non calcific plaque Through out the left. Doppler waveforms on the right  were biphasic in the common femoral artery  diminishing to dampened monophasic in the distal tibial arteries. The Doppler waveforms on the left were biphasic in the proximal common femoral artery diminishing to monophasic into the popliteal. The Doppler waveforms throughout the tibial arteries diminished to dampened monophasic with severelydiminishing velocities in the distal region. There is no evidence of a total occlusion.  CT head 10/28/2014 Plain films of the left tib-fib 10/29/2014  lower extremity venous Dopplers  Antibiotics:  IV Rocephin 10/28/2014>>>> 10/30/2014  HPI/Subjective: Patient has no new complaints. Denies any pain at LLE currently  Objective: Filed Vitals:   11/06/14 0918  BP: 187/70  Pulse: 59  Temp:   Resp: 13    Intake/Output Summary (Last 24 hours) at 11/06/14 9381 Last data filed at 11/06/14 0900  Gross per 24 hour  Intake 1879.86 ml  Output   1215 ml  Net 664.86 ml   Filed Weights   11/05/14 0500 11/05/14 1800 11/06/14 0500  Weight: 51.4 kg (113 lb 5.1 oz) 49.3 kg (108 lb 11 oz) 50.2 kg (110 lb 10.7 oz)    Exam:   General:  Frail elderly female, in nad, alert and awake.  Cardiovascular: RRR, no rubs  Respiratory: CTAB, no wheezes, no increased wob  Abdomen: Soft, nontender, nondistended, positive bowel sounds.  Musculoskeletal: No clubbing cyanosis or edema. Left lower extremity without pain on palpation   Data Reviewed: Basic Metabolic Panel:  Recent Labs Lab 11/01/14 0429 11/03/14 0500 11/04/14 0256 11/05/14 0308 11/06/14 0251  NA 138 131* 133* 133* 131*  K 5.0 5.1 4.9 4.4 4.5  CL 113* 107 105 104 104  CO2 18* 18* 22 23 20*  GLUCOSE 269* 114* 141* 174* 186*  BUN 33* 21* 20 20 20   CREATININE 1.41* 1.19* 1.24* 1.28* 1.09*  CALCIUM 8.5* 8.1* 8.3* 8.4* 7.9*   Liver Function Tests: No results for input(s): AST, ALT, ALKPHOS, BILITOT, PROT, ALBUMIN in the last 168 hours. No results for input(s): LIPASE, AMYLASE in the last 168  hours. No results for input(s): AMMONIA in the last 168 hours. CBC:  Recent Labs Lab 11/01/14 0429 11/03/14 0500 11/04/14 0256 11/05/14 0308 11/06/14 0251  WBC 5.4 5.2 4.8 5.3 3.9*  HGB 12.0 11.4* 10.5* 10.6* 9.7*  HCT 36.7 34.2* 31.4* 32.1* 29.1*  MCV 98.7 96.9 94.3 94.1 95.7  PLT 149* 135* 161 175 201   Cardiac Enzymes: No results for input(s): CKTOTAL, CKMB, CKMBINDEX, TROPONINI in the last 168 hours. BNP (last 3 results)  Recent Labs  09/11/14 1522  BNP 3381.1*    ProBNP (last 3 results) No results for input(s): PROBNP in the last 8760 hours.  CBG:  Recent Labs Lab 11/05/14 1115 11/05/14 1438 11/05/14 1741 11/05/14 2124 11/06/14 0735  GLUCAP 99 121* 154* 233* 137*    Recent Results (from the past 240 hour(s))  Urine culture     Status: None   Collection Time: 10/28/14  1:56 PM  Result Value Ref Range Status   Specimen Description URINE, CATHETERIZED  Final   Special Requests NONE  Final   Culture   Final    NO GROWTH 1 DAY Performed at [facility]    Report Status 10/30/2014 FINAL  Final  Culture, Urine     Status: None   Collection Time: 11/04/14  7:23 PM  Result Value Ref Range Status   Specimen Description URINE, RANDOM  Final   Special Requests NONE  Final   Culture >=100,000 COLONIES/mL ENTEROCOCCUS SPECIES  Final   Report Status 11/06/2014 FINAL  Final  Organism ID, Bacteria ENTEROCOCCUS SPECIES  Final      Susceptibility   Enterococcus species - MIC*    AMPICILLIN <=2 SENSITIVE Sensitive     LEVOFLOXACIN 2 SENSITIVE Sensitive     NITROFURANTOIN 64 INTERMEDIATE Intermediate     VANCOMYCIN <=0.5 SENSITIVE Sensitive     LINEZOLID 2 SENSITIVE Sensitive     * >=100,000 COLONIES/mL ENTEROCOCCUS SPECIES  MRSA PCR Screening     Status: None   Collection Time: 11/05/14  6:22 PM  Result Value Ref Range Status   MRSA by PCR NEGATIVE NEGATIVE Final    Comment:        The GeneXpert MRSA Assay (FDA approved for NASAL specimens only),  is one component of a comprehensive MRSA colonization surveillance program. It is not intended to diagnose MRSA infection nor to guide or monitor treatment for MRSA infections.      Studies: No results found.  Scheduled Meds: . antiseptic oral rinse  7 mL Mouth Rinse BID  . aspirin EC  81 mg Oral Daily  . clopidogrel  75 mg Oral Daily  . feeding supplement  1 Container Oral BID BM  . hydrALAZINE  100 mg Oral QID  . insulin aspart  0-9 Units Subcutaneous TID WC  . isosorbide mononitrate  120 mg Oral Daily  . metoprolol tartrate  75 mg Oral BID  . omega-3 acid ethyl esters  1 g Oral BID  . polyethylene glycol  17 g Oral BID  . senna-docusate  2 tablet Oral BID  . tamsulosin  0.4 mg Oral QPC supper   Continuous Infusions: . sodium chloride Stopped (11/05/14 2200)  . sodium chloride 10 mL/hr at 11/05/14 2200  . nitroPRUSSide 0.8 mcg/kg/min (11/05/14 2330)    Principal Problem:   Lower limb ischemia; LLE Active Problems:   Peripheral neuropathy   History of CVA (cerebrovascular accident) with associated mild right upper extremity hemiplegia   Pacemaker-[employer]   Type 2 diabetes mellitus with hyperosmolar nonketotic hyperglycemia   CKD (chronic kidney disease), stage III   HTN (hypertension)   Renal failure (ARF), acute on chronic   Altered mental status   Confusion   Urinary retention   Hyperkalemia   Acute renal failure syndrome   Hematuria   Acute encephalopathy    Time spent: 35 minutes    Velvet Bathe M.D. [facility] Pager [phone]. If 7PM-7AM, please contact night-coverage at [email or web address], password [email or web address] 11/06/2014, 9:37 AM  LOS: 9 days

## 2014-11-06 NOTE — Progress Notes (Signed)
OT Cancellation Note  Patient Details Name: Carmen [name] MRN: [id number] DOB: [birth year]   Cancelled Treatment:    Reason Eval/Treat Not Completed: Medical issues which prohibited therapy - BP 248/55. RN notified  Darlina Rumpf [place], OTR/L [phone]  11/06/2014, 3:33 PM

## 2014-11-06 NOTE — Progress Notes (Signed)
[facility]  Patient Status: Active (receiving services up to time of hospitalization)  [facility] is providing the following services: RN  If patient discharges after hours, please call [phone].   Carmen [name] 11/06/2014, 1:54 PM

## 2014-11-07 LAB — CBC
HEMATOCRIT: 31 % — AB (ref 36.0–46.0)
Hemoglobin: 10.6 g/dL — ABNORMAL LOW (ref 12.0–15.0)
MCH: 32.1 pg (ref 26.0–34.0)
MCHC: 34.2 g/dL (ref 30.0–36.0)
MCV: 93.9 fL (ref 78.0–100.0)
Platelets: 195 10*3/uL (ref 150–400)
RBC: 3.3 MIL/uL — AB (ref 3.87–5.11)
RDW: 13.8 % (ref 11.5–15.5)
WBC: 3.7 10*3/uL — AB (ref 4.0–10.5)

## 2014-11-07 LAB — GLUCOSE, CAPILLARY
Glucose-Capillary: 144 mg/dL — ABNORMAL HIGH (ref 65–99)
Glucose-Capillary: 335 mg/dL — ABNORMAL HIGH (ref 65–99)

## 2014-11-07 MED ORDER — LISINOPRIL 10 MG PO TABS
10.0000 mg | ORAL_TABLET | Freq: Every day | ORAL | Status: DC
  Administered 2014-11-07: 10 mg via ORAL
  Filled 2014-11-07: qty 1

## 2014-11-07 MED ORDER — OXYCODONE-ACETAMINOPHEN 5-325 MG PO TABS: 2.0000 | ORAL_TABLET | Freq: Four times a day (QID) | ORAL | Status: DC | PRN

## 2014-11-07 MED ORDER — FUROSEMIDE 20 MG PO TABS: 20.0000 mg | ORAL_TABLET | Freq: Every day | ORAL | Status: DC

## 2014-11-07 MED ORDER — TAMSULOSIN HCL 0.4 MG PO CAPS: 0.4000 mg | ORAL_CAPSULE | Freq: Every day | ORAL | Status: DC

## 2014-11-07 MED ORDER — METOPROLOL TARTRATE 75 MG PO TABS: 75.0000 mg | ORAL_TABLET | Freq: Two times a day (BID) | ORAL | Status: DC

## 2014-11-07 MED ORDER — DIAZEPAM 5 MG PO TABS: 5.0000 mg | ORAL_TABLET | Freq: Two times a day (BID) | ORAL | Status: DC

## 2014-11-07 NOTE — Progress Notes (Signed)
Physical Therapy Treatment Patient Details Name: MACLOVIA UHER MRN: [id number] DOB: 1933-12-10 Today's Date: 11/07/2014    History of Present Illness Pt is a 81 year old female admitted for confusion and CKD. LLE ischemia, transfer to [facility], 8/30 angioplasty to Lt SFA and popliteal arteries  PMH:  CVA with Rt UE hemiparesis, CHF, DM, HTN, CAD, back surgery, PVD, pacemaker, PVD, claudication    PT Comments    Pt incontinent of limited stool in bed on arrival, transferred to [facility] with pt able to void with assist for pericare. Pt with greatly improved ambulation today after toileting and education for HEP, mobility and progression. Will continue to follow  Follow Up Recommendations  Supervision/Assistance - 24 hour;Home health PT     Equipment Recommendations       Recommendations for Other Services       Precautions / Restrictions Precautions Precautions: Fall Precaution Comments: L LE ischemia/pain    Mobility  Bed Mobility Overal bed mobility: Modified Independent             General bed mobility comments: increased time with rail   Transfers Overall transfer level: Needs assistance   Transfers: Sit to/from Stand;Stand Pivot Transfers Sit to Stand: Min assist Stand pivot transfers: Min assist       General transfer comment: cues for hand placement with assist for anterior translation and elevation from surface  Ambulation/Gait Ambulation/Gait assistance: Min guard Ambulation Distance (Feet): 90 Feet Assistive device: Rolling walker (2 wheeled) Gait Pattern/deviations: Step-through pattern;Decreased stride length;Trunk flexed   Gait velocity interpretation: Below normal speed for age/gender General Gait Details: cues for posture, position in RW and sequence of gait   Stairs            Wheelchair Mobility    Modified Rankin (Stroke Patients Only)       Balance Overall balance assessment: Needs assistance   Sitting balance-Leahy Scale: Fair       Standing balance-Leahy Scale: Poor                      Cognition Arousal/Alertness: Awake/alert Behavior During Therapy: WFL for tasks assessed/performed Overall Cognitive Status: Within Functional Limits for tasks assessed                      Exercises General Exercises - Lower Extremity Ankle Circles/Pumps: AROM;Both;Seated;15 reps (limited ROM LLE secondary to pain and weakness) Long Arc Quad: AROM;Seated;15 reps;Both Hip Flexion/Marching: AROM;Seated;Both;15 reps    General Comments        Pertinent Vitals/Pain Pain Score: 5  Pain Location: anterior LLE Pain Descriptors / Indicators: Aching Pain Intervention(s): Limited activity within patient's tolerance;Repositioned  HR 64 sats 100% on RA BP 146/54    Home Living                      Prior Function            PT Goals (current goals can now be found in the care plan section) Progress towards PT goals: Progressing toward goals    Frequency       PT Plan Current plan remains appropriate    Co-evaluation             End of Session   Activity Tolerance: Patient tolerated treatment well Patient left: in chair;with call bell/phone within reach;with nursing/sitter in room     Time: 0736-0800 PT Time Calculation (min) (ACUTE ONLY): 24 min  Charges:  $Gait Training:  8-22 mins $Therapeutic Exercise: 8-22 mins                    G Codes:      Melford Aase Nov 19, 2014, 9:43 AM Elwyn Reach, [place]

## 2014-11-07 NOTE — Care Management Note (Signed)
Case Management Note  Patient Details  Name: Carmen [name] MRN: [id number] Date of Birth: 01/21/1934  Subjective/Objective:     Adm w leg ischemia, on iv nipride drip               Action/Plan: lives w husband, pcp robyn sanders,hx [facility], sw involeved for snf   Expected Discharge Date:                Expected Discharge Plan:  [place]  In-House Referral:  Clinical Social Work  Discharge planning Services  CM Consult  Post Acute Care Choice:  Resumption of Svcs/PTA Provider Choice offered to:     DME Arranged:    DME Agency:     HH Arranged:  RN [place] Agency:  [place]  Status of Service:  In process, will continue to follow  Medicare Important Message Given:  Yes-third notification given Date Medicare IM Given:    Medicare IM give by:    Date Additional Medicare IM Given:    Additional Medicare Important Message give by:     If discussed at [place] of Stay Meetings, dates discussed:    Additional Comments:sw following for snf for rehab  Lacretia Leigh, RN 11/07/2014, 9:08 AM

## 2014-11-07 NOTE — Progress Notes (Signed)
  [facility] Progress Note  Subjective  - Left leg feels better. Having some soreness at left anterior shin. Says this has been present since before procedure.   Objective Filed Vitals:   11/07/14 0939  BP: 146/54  Pulse: 64  Temp:   Resp:     Intake/Output Summary (Last 24 hours) at 11/07/14 1007 Last data filed at 11/07/14 0700  Gross per 24 hour  Intake 657.33 ml  Output   1200 ml  Net -542.67 ml   Left leg with erythema to anterior shin.  Brisk left PT and DP doppler flow.  Right groin soft without hematoma.  Assessment/Planning: 81 y.o. female is s/p: PTA and stenting to left leg x 2 2 Days Post-Op   Good doppler flow to left leg.  Left leg pain improved.  Ok to discharge home today. Discharge with ASA and plavix. Follow up with Dr. Einar Gip in 2-3 weeks with ABIs.   Carmen [name] 11/07/2014 10:07 AM --  Laboratory CBC    Component Value Date/Time   WBC 3.7* 11/07/2014 0818   HGB 10.6* 11/07/2014 0818   HCT 31.0* 11/07/2014 0818   PLT 195 11/07/2014 0818    BMET    Component Value Date/Time   NA 131* 11/06/2014 0251   K 4.5 11/06/2014 0251   CL 104 11/06/2014 0251   CO2 20* 11/06/2014 0251   GLUCOSE 186* 11/06/2014 0251   BUN 20 11/06/2014 0251   CREATININE 1.09* 11/06/2014 0251   CALCIUM 7.9* 11/06/2014 0251   GFRNONAA 47* 11/06/2014 0251   GFRAA 55* 11/06/2014 0251    COAG Lab Results  Component Value Date   INR 0.96 10/31/2014   INR 1.08 04/06/2013   INR 0.94 05/29/2012   No results found for: PTT  Antibiotics Anti-infectives    Start     Dose/Rate Route Frequency Ordered Stop   11/05/14 0600  cefUROXime (ZINACEF) 1.5 g in dextrose 5 % 50 mL IVPB     1.5 g 100 mL/hr over 30 Minutes Intravenous On call to O.R. 11/04/14 1149 11/06/14 0559   10/29/14 1600  cefTRIAXone (ROCEPHIN) 1 g in dextrose 5 % 50 mL IVPB  Status:  Discontinued     1 g 100 mL/hr over 30 Minutes Intravenous Every 24 hours 10/28/14 1940 10/30/14  1425   10/28/14 1530  cefTRIAXone (ROCEPHIN) 1 g in dextrose 5 % 50 mL IVPB  Status:  Discontinued     1 g 100 mL/hr over 30 Minutes Intravenous  Once 10/28/14 1518 10/28/14 [place], PA-C [facility] Office: [phone] Pager: [phone] 11/07/2014 10:07 AM

## 2014-11-07 NOTE — Discharge Summary (Signed)
Physician Discharge Summary  Carmen [name] BHA:[id number] DOB: September 10, 1933 DOA: 10/28/2014  PCP: Maximino Greenland, MD  Admit date: 10/28/2014 Discharge date: 11/07/2014  Time spent: > 35 minutes  Recommendations for Outpatient Follow-up:  1. Monitor BP's and adjust antihypertensive medication as needed 2. Monitor blood sugars will discontinue 70/30 given hypoglycemia while in house. If blood sugars rise and are high consistently may continue prior 70/30 regimen. Otherwise continue diabetic diet for blood sugar control 3. Pt had some hematuria presumed to be from traumatic foley insertion. If persists consider Urology consultation. 4. Follow up with Dr. Einar Gip in 2-3 weeks with ABIs.  5. Monitor serum creatinine given improvement back to baseline will continue lasix and lisinopril  Discharge Diagnoses:  Principal Problem:   Lower limb ischemia; LLE Active Problems:   Peripheral neuropathy   History of CVA (cerebrovascular accident) with associated mild right upper extremity hemiplegia   Pacemaker-[employer]   Type 2 diabetes mellitus with hyperosmolar nonketotic hyperglycemia   CKD (chronic kidney disease), stage III   HTN (hypertension)   Renal failure (ARF), acute on chronic   Altered mental status   Confusion   Urinary retention   Hyperkalemia   Acute renal failure syndrome   Hematuria   Acute encephalopathy   Discharge Condition: stable  Diet recommendation: Carb modified diet  Filed Weights   11/05/14 1800 11/06/14 0500 11/07/14 0500  Weight: 49.3 kg (108 lb 11 oz) 50.2 kg (110 lb 10.7 oz) 50.8 kg (111 lb 15.9 oz)    History of present illness:  From original HPI: 81 year old female with history of HTN, IDDM/DM 2, CVA with right upper extremity contracture, CKD3, chronic low back pain, CAD, PAD, PPM, history of intermittent left leg pain for the last 3-4 months which has progressively worsened in the last week, seen at [facility] ED on 8/21 at which time assessed to have  radiculopathy related pain-EDP discussed with Dr. Einar Gip stated that she has chronically cool left extremity without palpable or dopplerable distal pulses. She was given pain medications and discharged home but returned and admitted to the [facility] on 10/28/14 due to confusion and persistent left leg pain. She was found with acute renal failure, hyperkalemia and hematuria. Acute renal failure and hyperkalemia have resolved. Patient has acute on chronic left lower extremity limb threatening ischemia. Dr. Einar Gip has consulted and plans peripheral arteriogram on 8/30. He recommended transferring patient to [facility] in case she requires emergent intervention or for elective procedure.  Hospital Course:  Acute on chronic left lower extremity limb threatening ischemia/Left leg pain  - No reported trauma. Picture not consistent with peripheral neuropathy or radiculopathy pain. X-ray negative for fractures. Lower extremity venous Dopplers negative.  - Vascular/Dr Einar Gip on board while patient was in house. - Plan is to continue plavix and aspirin.  - SNF placement on d/c, discussed with pt and Education officer, museum. S/P successful balloon PTA of the left SFA and left popliteal artery by Dr. Trula Slade, appreciate his help.  Acute on stage III chronic kidney disease  - Baseline creatinine probably in the 1.2-1.5 range. Admitted with creatinine of 2.3.  - Most likely related to poor oral intake and dehydration.  - No hydronephrosis by CT renal stone study.  - resolved with IV fluid rehydration.  Hyperkalemia  - Secondary to acute on chronic kidney disease. Also on ACEI/lisinopril and potassium supplements at home - Provided a dose of Kayexalate 8/24.  - Resolved.   Metabolic acidosis Improved. Discontinued bicarbonate drip.  Hematuria  -  Microscopic hematuria. CT renal stone study: No stone. UTI-urine culture negative. Status post 3 days IV Rocephin. Consider outpatient consultation with urology.   Presumed to be 2ary to traumatic foley placement  Urinary retention Status post Foley catheter placement. Continue Flomax.   Acute encephalopathy - Secondary to metabolic derangement (acute renal failure) and opioids. Resolved. - CT head without acute findings.  Uncontrolled type II DM/IDDM/hypoglycemia - Hypoglycemic episodes with CBG of 41 on 11/01/2014. CBG 59 on 11/02/2014. D/C'd 70/30.  - discharge on diabetic diet. If blood sugars persistently elevated may consider continuing her prior 70/30 regimen.  Essential hypertension - Pt is currently on imdur, metoprolol, hydralazine, lisinopril, lasix  PAD status post bilateral iliac stenting Management as above.  CVA with residual R UE contracture - Continue aspirin and Plavix for secondary stroke prevention postoperatively when okay with Dr. Bertrum Sol surgery. PT/OT.  Failure to thrive  Fecal impaction - resolved  Procedures:  Please see above.  Consultations:  Cardiology  Vascular surgery  Discharge Exam: Filed Vitals:   11/07/14 1137  BP:   Pulse:   Temp: 98.5 F (36.9 C)  Resp:     General: Pt in nad, alert and awake Cardiovascular: rrr, no rubs Respiratory: cta bl, no wheezes  Discharge Instructions   Discharge Instructions    Call MD for:  extreme fatigue    Complete by:  As directed      Call MD for:  severe uncontrolled pain    Complete by:  As directed      Call MD for:  temperature >100.4    Complete by:  As directed      Diet - low sodium heart healthy    Complete by:  As directed      Discharge instructions    Complete by:  As directed   Please follow up with your cardiologist Dr. Einar Gip after discharge     Increase activity slowly    Complete by:  As directed           Current Discharge Medication List    START taking these medications   Details  tamsulosin (FLOMAX) 0.4 MG CAPS capsule Take 1 capsule (0.4 mg total) by mouth daily after supper. Qty: 30 capsule, Refills: 0       CONTINUE these medications which have CHANGED   Details  diazepam (VALIUM) 5 MG tablet Take 1 tablet (5 mg total) by mouth 2 (two) times daily. Qty: 15 tablet, Refills: 0    furosemide (LASIX) 20 MG tablet Take 1 tablet (20 mg total) by mouth daily. Qty: 30 tablet, Refills: 0    metoprolol tartrate 75 MG TABS Take 75 mg by mouth 2 (two) times daily. Qty: 60 tablet, Refills: 0    oxyCODONE-acetaminophen (PERCOCET/ROXICET) 5-325 MG per tablet Take 2 tablets by mouth every 6 (six) hours as needed for moderate pain. Qty: 30 tablet, Refills: 0      CONTINUE these medications which have NOT CHANGED   Details  aspirin EC 81 MG tablet Take 81 mg by mouth daily.    clopidogrel (PLAVIX) 75 MG tablet Take 75 mg by mouth daily.    hydrALAZINE (APRESOLINE) 100 MG tablet Take 1 tablet (100 mg total) by mouth 3 (three) times daily. Qty: 120 tablet, Refills: 4    isosorbide mononitrate (IMDUR) 120 MG 24 hr tablet Take 1 tablet (120 mg total) by mouth daily. Qty: 30 tablet, Refills: 0    lisinopril (ZESTRIL) 10 MG tablet Take 1 tablet (10 mg total) by  mouth daily. Qty: 30 tablet, Refills: 0    Omega-3 Fatty Acids (FISH OIL PEARLS) 300 MG CAPS Take 1 tablet by mouth 2 (two) times daily.    potassium chloride SA (K-DUR,KLOR-CON) 10 MEQ tablet Take 1 tablet (10 mEq total) by mouth daily. Qty: 30 tablet, Refills: 1      STOP taking these medications     benazepril (LOTENSIN) 40 MG tablet      insulin NPH-regular Human (NOVOLIN 70/30) (70-30) 100 UNIT/ML injection        Allergies  Allergen Reactions  . Peanuts [Peanut Oil] Swelling  . Norvasc [Amlodipine Besylate] Swelling  . Peanut-Containing Drug Products Other (See Comments)    Cause my stomach to hurt   Follow-up Information    Call Adrian Prows, MD.   Specialty:  Cardiology   Why:  To be seen in 3 weeks. Will schedule ABI as op   Contact information:   [address] [place] [address] [phone]         The results of significant diagnostics from this hospitalization (including imaging, microbiology, ancillary and laboratory) are listed below for reference.    Significant Diagnostic Studies: Ct Head Wo Contrast  10/28/2014   CLINICAL DATA:  Altered mental status with weakness. Took Ativan and Percocet.  EXAM: CT HEAD WITHOUT CONTRAST  TECHNIQUE: Contiguous axial images were obtained from the base of the skull through the vertex without intravenous contrast.  COMPARISON:  06/02/2013 and 04/30/2012  FINDINGS: Ventricles, cisterns and other CSF spaces are within normal. There is evidence of chronic ischemic microvascular disease. Chronic stable patchy low-attenuation and linear high density over the left frontal region, basal ganglia and insular cortex. No mass, mass effect, shift of midline structures or acute hemorrhage. No evidence of acute infarction. Subtle patchy low-attenuation over the pons which is stable. Remainder of the exam is unremarkable.  IMPRESSION: No acute intracranial findings.  Chronic ischemic microvascular disease and chronic change in the left frontal/insular region and basal ganglia.   Electronically Signed   By: Marin Olp M.D.   On: 10/28/2014 14:55   Dg Tibia/fibula Left Port  10/29/2014   CLINICAL DATA:  Patient with generalized lower extremity pain. No reported injury. Initial encounter.  EXAM: PORTABLE LEFT TIBIA AND FIBULA - 2 VIEW  COMPARISON:  None.  FINDINGS: Normal anatomic alignment. No evidence for acute fracture or dislocation. Regional soft tissues demonstrate vascular calcifications. Midfoot degenerative changes.  IMPRESSION: No acute osseous abnormality.   Electronically Signed   By: Lovey Newcomer M.D.   On: 10/29/2014 16:05   Ct Renal Stone Study  10/28/2014   CLINICAL DATA:  Hematuria, weakness.  Altered mental status.  EXAM: CT ABDOMEN AND PELVIS WITHOUT CONTRAST  TECHNIQUE: Multidetector CT imaging of the abdomen and pelvis was performed following the  standard protocol without IV contrast.  COMPARISON:  04/18/2012  FINDINGS: Calcified granuloma in the left lower lobe. Lung bases are clear. No effusions. Heart is normal size. No pleural effusions. Heart is normal size. Densely calcified coronary arteries. Pacer wires partially imaged in the right heart.  Liver, gallbladder, adrenals are unremarkable. Bilateral renal cysts. Punctate calcifications in the lower pole of the left kidney. No hydronephrosis. Calcifications in the spleen compatible with old granulomas disease. Pancreas is atrophic. Pancreatic calcifications noted compatible with chronic pancreatitis. This is stable.  Aorta and iliac vessels are heavily calcified as are the branch vessels. No aneurysm. Large stool burden in the colon, particularly rectosigmoid colon suggesting fecal impaction.  Small bowel and stomach decompressed, grossly unremarkable. No free fluid, free air or adenopathy.  Prior hysterectomy. No visible adnexal masses. No free fluid, free air or adenopathy.  No acute bony abnormality or focal bone lesion. Postoperative changes in the lower lumbar spine.  IMPRESSION: Changes of chronic pancreatitis.  Old granulomas disease in the left lower lobe and spleen.  Coronary artery disease.  Large stool burden throughout the colon, particularly rectosigmoid colon suggesting fecal impaction.   Electronically Signed   By: Rolm Baptise M.D.   On: 10/28/2014 14:52    Microbiology: Recent Results (from the past 240 hour(s))  Urine culture     Status: None   Collection Time: 10/28/14  1:56 PM  Result Value Ref Range Status   Specimen Description URINE, CATHETERIZED  Final   Special Requests NONE  Final   Culture   Final    NO GROWTH 1 DAY Performed at [facility]    Report Status 10/30/2014 FINAL  Final  Culture, Urine     Status: None   Collection Time: 11/04/14  7:23 PM  Result Value Ref Range Status   Specimen Description URINE, RANDOM  Final   Special Requests NONE   Final   Culture >=100,000 COLONIES/mL ENTEROCOCCUS SPECIES  Final   Report Status 11/06/2014 FINAL  Final   Organism ID, Bacteria ENTEROCOCCUS SPECIES  Final      Susceptibility   Enterococcus species - MIC*    AMPICILLIN <=2 SENSITIVE Sensitive     LEVOFLOXACIN 2 SENSITIVE Sensitive     NITROFURANTOIN 64 INTERMEDIATE Intermediate     VANCOMYCIN <=0.5 SENSITIVE Sensitive     LINEZOLID 2 SENSITIVE Sensitive     * >=100,000 COLONIES/mL ENTEROCOCCUS SPECIES  MRSA PCR Screening     Status: None   Collection Time: 11/05/14  6:22 PM  Result Value Ref Range Status   MRSA by PCR NEGATIVE NEGATIVE Final    Comment:        The GeneXpert MRSA Assay (FDA approved for NASAL specimens only), is one component of a comprehensive MRSA colonization surveillance program. It is not intended to diagnose MRSA infection nor to guide or monitor treatment for MRSA infections.      Labs: Basic Metabolic Panel:  Recent Labs Lab 11/01/14 0429 11/03/14 0500 11/04/14 0256 11/05/14 0308 11/06/14 0251  NA 138 131* 133* 133* 131*  K 5.0 5.1 4.9 4.4 4.5  CL 113* 107 105 104 104  CO2 18* 18* 22 23 20*  GLUCOSE 269* 114* 141* 174* 186*  BUN 33* 21* 20 20 20   CREATININE 1.41* 1.19* 1.24* 1.28* 1.09*  CALCIUM 8.5* 8.1* 8.3* 8.4* 7.9*   Liver Function Tests: No results for input(s): AST, ALT, ALKPHOS, BILITOT, PROT, ALBUMIN in the last 168 hours. No results for input(s): LIPASE, AMYLASE in the last 168 hours. No results for input(s): AMMONIA in the last 168 hours. CBC:  Recent Labs Lab 11/03/14 0500 11/04/14 0256 11/05/14 0308 11/06/14 0251 11/07/14 0818  WBC 5.2 4.8 5.3 3.9* 3.7*  HGB 11.4* 10.5* 10.6* 9.7* 10.6*  HCT 34.2* 31.4* 32.1* 29.1* 31.0*  MCV 96.9 94.3 94.1 95.7 93.9  PLT 135* 161 175 201 195   Cardiac Enzymes: No results for input(s): CKTOTAL, CKMB, CKMBINDEX, TROPONINI in the last 168 hours. BNP: BNP (last 3 results)  Recent Labs  09/11/14 1522  BNP 3381.1*     ProBNP (last 3 results) No results for input(s): PROBNP in the last 8760 hours.  CBG:  Recent Labs Lab  11/06/14 1122 11/06/14 1726 11/06/14 2151 11/07/14 0726 11/07/14 1136  GLUCAP 238* 333* 125* 144* 335*     Signed:  Velvet Bathe  [facility] 11/07/2014, 12:18 PM

## 2014-11-07 NOTE — Progress Notes (Signed)
Discharge teaching completed with  Pt and pts grandaughter. IV removed. [facility] here to transport pt to [place]. All belongings sent with the pts daughter.

## 2014-11-07 NOTE — Care Management Important Message (Signed)
Important Message  Patient Details  Name: Carmen [name] MRN: [id number] Date of Birth: 05-Jul-1933   Medicare Important Message Given:  Yes-fourth notification given    Pricilla Handler 11/07/2014, 11:05 AM

## 2014-11-07 NOTE — Clinical Social Work Placement (Signed)
   CLINICAL SOCIAL WORK PLACEMENT  NOTE  Date:  11/07/2014  Patient Details  Name: Carmen [name] MRN: [id number] Date of Birth: [birth year]  Clinical Social Work is seeking post-discharge placement for this patient at the [place] level of care (*CSW will initial, date and re-position this form in  chart as items are completed):  Yes   Patient/family provided with [place] Work Department's list of facilities offering this level of care within the geographic area requested by the patient (or if unable, by the patient's family).  Yes   Patient/family informed of their freedom to choose among providers that offer the needed level of care, that participate in Medicare, Medicaid or managed care program needed by the patient, have an available bed and are willing to accept the patient.  Yes   Patient/family informed of [facility]'s ownership interest in [facility] and [facility], as well as of the fact that they are under no obligation to receive care at these facilities.  PASRR submitted to EDS on 10/31/14     PASRR number received on 10/31/14     Existing PASRR number confirmed on       FL2 transmitted to all facilities in geographic area requested by pt/family on 10/31/14     FL2 transmitted to all facilities within larger geographic area on       Patient informed that his/her managed care company has contracts with or will negotiate with certain facilities, including the following:        Yes   Patient/family informed of bed offers received.  Patient chooses bed at [place] recommends and patient chooses bed at      Patient to be transferred to [facility] on 11/07/14.  Patient to be transferred to facility by Ambulance [facility])     Patient family notified on 11/07/14 of transfer.  Name of family member notified:  Carmen [name]- Granddaughter and Carmen [name]- Granddaughter      PHYSICIAN Please prepare priority discharge summary, including medications, Please sign FL2, Please prepare prescriptions     Additional Comment: Ok per MD for d/c today to SNF. Patient is agreeable to d/c plan as is her granddaughters- Carmen [name] and Carmen [name]. Nursing notified to call report.  Packet completed and will be placed on patient's chart for EMS pick up. No further CSW needs identified; CSW signing off. Lorie Phenix. Murrell Redden [phone]      _______________________________________________ Williemae Area, LCSW 11/07/2014, 2:08 PM

## 2014-11-07 NOTE — Progress Notes (Signed)
Inpatient Diabetes Program Recommendations  AACE/ADA: New Consensus Statement on Inpatient Glycemic Control (2013)  Target Ranges:  Prepandial:   less than 140 mg/dL      Peak postprandial:   less than 180 mg/dL (1-2 hours)      Critically ill patients:  140 - 180 mg/dL   Results for NIKKOL, PAI (MRN [id number]) as of 11/07/2014 09:27  Ref. Range 11/06/2014 07:35 11/06/2014 11:22 11/06/2014 17:26 11/06/2014 21:51 11/07/2014 07:26  Glucose-Capillary Latest Ref Range: 65-99 mg/dL 137 (H) 238 (H) 333 (H) 125 (H) 144 (H)   Diabetes history: DM 2 Outpatient Diabetes medications: 70/30 8 units BID Current orders for Inpatient glycemic control: Novolog Sensitive TID  Inpatient Diabetes Program Recommendations Correction (SSI): Glucose increased to 300s yesterday due to feeding supplements and good PO intake. Please consider increasing Novolog Correction to Resistant scale OR start Novolog 3 units meal coverage TID.  Thanks,  Tama Headings RN, MSN, [facility] Inpatient Diabetes Coordinator Team Pager [phone]

## 2014-11-12 ENCOUNTER — Non-Acute Institutional Stay (SKILLED_NURSING_FACILITY): Payer: Medicare Other | Admitting: Internal Medicine

## 2014-11-12 ENCOUNTER — Encounter: Payer: Self-pay | Admitting: Internal Medicine

## 2014-11-12 DIAGNOSIS — I998 Other disorder of circulatory system: Secondary | ICD-10-CM | POA: Diagnosis not present

## 2014-11-12 DIAGNOSIS — N189 Chronic kidney disease, unspecified: Secondary | ICD-10-CM | POA: Diagnosis not present

## 2014-11-12 DIAGNOSIS — I10 Essential (primary) hypertension: Secondary | ICD-10-CM | POA: Diagnosis not present

## 2014-11-12 DIAGNOSIS — R339 Retention of urine, unspecified: Secondary | ICD-10-CM

## 2014-11-12 DIAGNOSIS — E872 Acidosis, unspecified: Secondary | ICD-10-CM

## 2014-11-12 DIAGNOSIS — E875 Hyperkalemia: Secondary | ICD-10-CM

## 2014-11-12 DIAGNOSIS — N179 Acute kidney failure, unspecified: Secondary | ICD-10-CM

## 2014-11-12 DIAGNOSIS — E1101 Type 2 diabetes mellitus with hyperosmolarity with coma: Secondary | ICD-10-CM

## 2014-11-12 DIAGNOSIS — G9340 Encephalopathy, unspecified: Secondary | ICD-10-CM | POA: Diagnosis not present

## 2014-11-12 DIAGNOSIS — I639 Cerebral infarction, unspecified: Secondary | ICD-10-CM | POA: Diagnosis not present

## 2014-11-12 DIAGNOSIS — E1100 Type 2 diabetes mellitus with hyperosmolarity without nonketotic hyperglycemic-hyperosmolar coma (NKHHC): Secondary | ICD-10-CM

## 2014-11-12 NOTE — Progress Notes (Signed)
MRN: [id number] Name: Carmen [name]  Sex: female Age: 81 y.o. DOB: 1933/12/12  [place] #: Helene Kelp Facility/Room:312 Level Of Care: SNF Provider: Inocencio Homes D Emergency Contacts: Extended Emergency Contact Information Primary Emergency Contact: [place] Phone: [phone] Mobile Phone: [phone] Relation: [place] Secondary Emergency Contact: Erker,James Address: [place], [place] [address] [place] Mobile Phone: [phone] Relation: Spouse  Code Status:   Allergies: Peanuts; Norvasc; and Peanut-containing drug products  Chief Complaint  Patient presents with  . New Admit To SNF    HPI: Patient is 81 y.o. female whohistory of HTN, IDDM/DM 2, CVA with right upper extremity contracture, CKD3, chronic low back pain, CAD, PAD, PPM, history of intermittent left leg pain for the last 3-4 months which has progressively worsened in the last week, seen at [facility] ED on 8/21 at which time assessed to have radiculopathy related pain-EDP discussed with Dr. Einar Gip stated that she has chronically cool left extremity without palpable or dopplerable distal pulses. She was given pain medications and discharged home but returned and admitted to the [facility] on 10/28/14 due to confusion and persistent left leg pain. She was found with acute renal failure, hyperkalemia and hematuria. Pt was admitted to hospital from 8/22-9/1 where a peripheral arteriogram done after resolution of AKI showed dx and pt underwent a successful balloon PTA of L SFA and L popliteal artery. Pt's hospital course was complicated by acute encephalopathy, hypoglycemic episodes and metabolic acidosis. Pt is admitted to SNF for generalized weakness for OT/PT. While at SNF pt will be followed for HTN, tx with lisinopril, hydralazine, metoprolol and lasix, CVA prophylaxed with ASA and plavix and HLD tx with fish oil.  Past Medical History   Diagnosis Date  . Hypertension   . Coronary artery disease   . Renal disorder   . Herniated lumbar intervertebral disc   . Cataract     "both eyes; blood pressure's always too high to have them fixed" (09/11/2014)  . Renovascular hypertension      s/p left renal artery stent 12/2007.  S/P balloon angioplasty on 02/16/10 for ISR, BP was  controlled well since then.     . Claudication in peripheral vascular disease     02/16/10: Left CIA 9.0x28 Omnilink and REIA 8.0x40 seff expanding Zilver.   right subclavian artery stent 03/18/2008,  . Peripheral vascular disease   . Anxiety   . Pacemaker   . Pneumonia X 2  . IDDM (insulin dependent diabetes mellitus)   . Migraine     "used to have terrible migraines; stopped in the 1990's"  . Stroke 1999    Stroke and TIA in 1999 with right sided weakness, now with residual right arm weakness (09/11/2014)  . Arthritis     "everywhere"  . Chronic lower back pain     Past Surgical History  Procedure Laterality Date  . Appendectomy    . Ureteral stent placement    . Carotid endarterectomy Left 1991     Stroke and TIA in 1999 with right sided weakness, now with residual right arm weakness  . Lumbar laminectomy      'herniated disc"  . Back surgery    . Colonoscopy  07/30/2011    Procedure: COLONOSCOPY;  Surgeon: Inda Castle, MD;  Location: [place];  Service: Endoscopy;  Laterality: N/A;  gi bleed  . Esophagogastroduodenoscopy  07/30/2011    Procedure: ESOPHAGOGASTRODUODENOSCOPY (EGD);  Surgeon: Inda Castle, MD;  Location: [place];  Service: Endoscopy;  Laterality: N/A;  . Permanent pacemaker insertion Left 06/28/2011    Procedure: PERMANENT PACEMAKER INSERTION;  Surgeon: Deboraha Sprang, MD;  Location: [facility] CATH LAB;  Service: Cardiovascular;  Laterality: Left;  . Abdominal angiogram N/A 07/06/2011    Procedure: ABDOMINAL ANGIOGRAM;  Surgeon: Laverda Page, MD;  Location: [facility] CATH LAB;  Service: Cardiovascular;  Laterality: N/A;  .  Renal angiogram N/A 07/06/2011    Procedure: RENAL ANGIOGRAM;  Surgeon: Laverda Page, MD;  Location: [facility] CATH LAB;  Service: Cardiovascular;  Laterality: N/A;  . Lower extremity angiogram Right 05/29/2012    Procedure: LOWER EXTREMITY ANGIOGRAM;  Surgeon: Laverda Page, MD;  Location: [facility] CATH LAB;  Service: Cardiovascular;  Laterality: Right;  . Tonsillectomy    . Insert / replace / remove pacemaker    . Excisional hemorrhoidectomy    . Carpal tunnel release Left   . Vaginal hysterectomy    . Peripheral vascular catheterization N/A 11/05/2014    Procedure: Abdominal Aortogram;  Surgeon: Serafina Mitchell, MD;  Location: [place] CV LAB;  Service: Cardiovascular;  Laterality: N/A;      Medication List       This list is accurate as of: 11/12/14 11:59 PM.  Always use your most recent med list.               aspirin EC 81 MG tablet  Take 81 mg by mouth daily.     clopidogrel 75 MG tablet  Commonly known as:  PLAVIX  Take 75 mg by mouth daily.     diazepam 5 MG tablet  Commonly known as:  VALIUM  Take 1 tablet (5 mg total) by mouth 2 (two) times daily.     FISH OIL PEARLS 300 MG Caps  Take 1 tablet by mouth 2 (two) times daily.     furosemide 20 MG tablet  Commonly known as:  LASIX  Take 1 tablet (20 mg total) by mouth daily.     hydrALAZINE 100 MG tablet  Commonly known as:  APRESOLINE  Take 1 tablet (100 mg total) by mouth 3 (three) times daily.     isosorbide mononitrate 120 MG 24 hr tablet  Commonly known as:  IMDUR  Take 1 tablet (120 mg total) by mouth daily.     lisinopril 10 MG tablet  Commonly known as:  ZESTRIL  Take 1 tablet (10 mg total) by mouth daily.     Metoprolol Tartrate 75 MG Tabs  Take 75 mg by mouth 2 (two) times daily.     oxyCODONE-acetaminophen 5-325 MG per tablet  Commonly known as:  PERCOCET/ROXICET  Take 2 tablets by mouth every 6 (six) hours as needed for moderate pain.     potassium chloride 10 MEQ tablet  Commonly known as:   K-DUR,KLOR-CON  Take 1 tablet (10 mEq total) by mouth daily.     tamsulosin 0.4 MG Caps capsule  Commonly known as:  FLOMAX  Take 1 capsule (0.4 mg total) by mouth daily after supper.        No orders of the defined types were placed in this encounter.    Immunization History  Administered Date(s) Administered  . Tdap 04/30/2012    Social History  Substance Use Topics  . Smoking status: Current Every Day Smoker -- 0.00 packs/day for 70 years    Types: Cigarettes  . Smokeless tobacco: Never Used     Comment: 09/11/2014 "  I used to smoke very heavy; down to 1 cigarette/day"  . Alcohol Use: Yes     Comment: "stopped drinking back in the 1970's"    Family history is  +CA and MI.  Review of Systems  DATA OBTAINED: from patient, nurse, medical record GENERAL:  no fevers, fatigue, appetite changes SKIN: No itching, rash or wounds EYES: No eye pain, redness, discharge EARS: No earache, tinnitus, change in hearing NOSE: No congestion, drainage or bleeding  MOUTH/THROAT: No mouth or tooth pain, No sore throat RESPIRATORY: No cough, wheezing, SOB CARDIAC: No chest pain, palpitations, lower extremity edema  GI: No abdominal pain, No N/V/D or constipation, No heartburn or reflux  GU: No dysuria, frequency or urgency, or incontinence  MUSCULOSKELETAL: No unrelieved bone/joint pain NEUROLOGIC: No headache, dizziness or focal weakness PSYCHIATRIC: No c/o anxiety or sadness   Filed Vitals:   11/12/14 1957  BP: 182/75  Pulse: 60  Temp: 97.6 F (36.4 C)  Resp: 18    SpO2 Readings from Last 1 Encounters:  11/07/14 100%        Physical Exam  GENERAL APPEARANCE: Alert, conversant,  No acute distress.  SKIN: No diaphoresis rash HEAD: Normocephalic, atraumatic  EYES: Conjunctiva/lids clear. Pupils round, reactive. EOMs intact.  EARS: External exam WNL, canals clear. Hearing grossly normal.  NOSE: No deformity or discharge.  MOUTH/THROAT: Lips w/o lesions  RESPIRATORY:  Breathing is even, unlabored. Lung sounds are clear   CARDIOVASCULAR: Heart RRR no murmurs, rubs or gallops. No peripheral edema.   GASTROINTESTINAL: Abdomen is soft, non-tender, not distended w/ normal bowel sounds. GENITOURINARY: Bladder non tender, not distended  MUSCULOSKELETAL: No abnormal joints or musculature NEUROLOGIC:  Cranial nerves 2-12 grossly intact. Moves all extremities  PSYCHIATRIC: Mood and affect appropriate to situation, no behavioral issues  Patient Active Problem List   Diagnosis Date Noted  . Metabolic acidosis 28/78/6767  . Urinary retention 11/02/2014  . Lower limb ischemia; LLE 11/02/2014  . Hyperkalemia 11/02/2014  . Acute renal failure syndrome   . Hematuria   . Acute encephalopathy   . Altered mental status 10/28/2014  . Confusion 10/28/2014  . Pulmonary edema 09/11/2014  . Acute respiratory failure with hypoxia 09/11/2014  . Acute diastolic heart failure 20/94/7096  . Protein-calorie malnutrition, severe 08/06/2013  . Hypertensive crisis 08/05/2013  . Hypertensive urgency 08/03/2013  . Weakness generalized 04/06/2013  . Dehydration 04/06/2013  . Transaminitis 04/06/2013  . Renal failure (ARF), acute on chronic 04/06/2013  . Type 2 diabetes mellitus with hyperosmolar nonketotic hyperglycemia 04/05/2013  . CKD (chronic kidney disease), stage III 04/05/2013  . HTN (hypertension) 04/05/2013  . Dyslipidemia 04/05/2013  . Abnormal LFTs 04/05/2013  . CVA (cerebral infarction) 10/15/2011  . [employer] 10/11/2011  . History of CVA (cerebrovascular accident) with associated mild right upper extremity hemiplegia 07/30/2011  . Cardiac pacemaker for history of intermittent heart block 07/30/2011  . Retrovascular hypertension status post left renal artery stent 2009 and balloon angioplasty for in-stent restenosis 2011 04/05/2011  . Hypokalemia 03/28/2011  . Tobacco abuse 03/28/2011  . DM (diabetes mellitus), type 2, uncontrolled, periph  vascular complic 28/36/6294  . Peripheral neuropathy 03/28/2011    CBC    Component Value Date/Time   WBC 3.7* 11/07/2014 0818   RBC 3.30* 11/07/2014 0818   HGB 10.6* 11/07/2014 0818   HCT 31.0* 11/07/2014 0818   PLT 195 11/07/2014 0818   MCV 93.9 11/07/2014 0818   LYMPHSABS 1.1 10/28/2014 1245   MONOABS 0.2 10/28/2014 1245   EOSABS 0.0  10/28/2014 1245   BASOSABS 0.0 10/28/2014 1245    CMP     Component Value Date/Time   NA 131* 11/06/2014 0251   K 4.5 11/06/2014 0251   CL 104 11/06/2014 0251   CO2 20* 11/06/2014 0251   GLUCOSE 186* 11/06/2014 0251   BUN 20 11/06/2014 0251   CREATININE 1.09* 11/06/2014 0251   CALCIUM 7.9* 11/06/2014 0251   PROT 7.1 10/28/2014 1649   ALBUMIN 3.9 10/28/2014 1649   AST 27 10/28/2014 1649   ALT 36 10/28/2014 1649   ALKPHOS 62 10/28/2014 1649   BILITOT 0.6 10/28/2014 1649   GFRNONAA 47* 11/06/2014 0251   GFRAA 55* 11/06/2014 0251    Lab Results  Component Value Date   HGBA1C 8.7* 09/11/2014     Ct Head Wo Contrast  10/28/2014   CLINICAL DATA:  Altered mental status with weakness. Took Ativan and Percocet.  EXAM: CT HEAD WITHOUT CONTRAST  TECHNIQUE: Contiguous axial images were obtained from the base of the skull through the vertex without intravenous contrast.  COMPARISON:  06/02/2013 and 04/30/2012  FINDINGS: Ventricles, cisterns and other CSF spaces are within normal. There is evidence of chronic ischemic microvascular disease. Chronic stable patchy low-attenuation and linear high density over the left frontal region, basal ganglia and insular cortex. No mass, mass effect, shift of midline structures or acute hemorrhage. No evidence of acute infarction. Subtle patchy low-attenuation over the pons which is stable. Remainder of the exam is unremarkable.  IMPRESSION: No acute intracranial findings.  Chronic ischemic microvascular disease and chronic change in the left frontal/insular region and basal ganglia.   Electronically Signed   By:  Marin Olp M.D.   On: 10/28/2014 14:55   Dg Tibia/fibula Left Port  10/29/2014   CLINICAL DATA:  Patient with generalized lower extremity pain. No reported injury. Initial encounter.  EXAM: PORTABLE LEFT TIBIA AND FIBULA - 2 VIEW  COMPARISON:  None.  FINDINGS: Normal anatomic alignment. No evidence for acute fracture or dislocation. Regional soft tissues demonstrate vascular calcifications. Midfoot degenerative changes.  IMPRESSION: No acute osseous abnormality.   Electronically Signed   By: Lovey Newcomer M.D.   On: 10/29/2014 16:05   Ct Renal Stone Study  10/28/2014   CLINICAL DATA:  Hematuria, weakness.  Altered mental status.  EXAM: CT ABDOMEN AND PELVIS WITHOUT CONTRAST  TECHNIQUE: Multidetector CT imaging of the abdomen and pelvis was performed following the standard protocol without IV contrast.  COMPARISON:  04/18/2012  FINDINGS: Calcified granuloma in the left lower lobe. Lung bases are clear. No effusions. Heart is normal size. No pleural effusions. Heart is normal size. Densely calcified coronary arteries. Pacer wires partially imaged in the right heart.  Liver, gallbladder, adrenals are unremarkable. Bilateral renal cysts. Punctate calcifications in the lower pole of the left kidney. No hydronephrosis. Calcifications in the spleen compatible with old granulomas disease. Pancreas is atrophic. Pancreatic calcifications noted compatible with chronic pancreatitis. This is stable.  Aorta and iliac vessels are heavily calcified as are the branch vessels. No aneurysm. Large stool burden in the colon, particularly rectosigmoid colon suggesting fecal impaction. Small bowel and stomach decompressed, grossly unremarkable. No free fluid, free air or adenopathy.  Prior hysterectomy. No visible adnexal masses. No free fluid, free air or adenopathy.  No acute bony abnormality or focal bone lesion. Postoperative changes in the lower lumbar spine.  IMPRESSION: Changes of chronic pancreatitis.  Old granulomas  disease in the left lower lobe and spleen.  Coronary artery disease.  Large stool burden  throughout the colon, particularly rectosigmoid colon suggesting fecal impaction.   Electronically Signed   By: Rolm Baptise M.D.   On: 10/28/2014 14:52    Not all labs, radiology exams or other studies done during hospitalization come through on my EPIC note; however they are reviewed by me.    Assessment and Plan  Lower limb ischemia; LLE No reported trauma. Picture not consistent with peripheral neuropathy or radiculopathy pain. X-ray negative for fractures. Lower extremity venous Dopplers negative.  - Vascular/Dr Einar Gip on board while patient was in house. - Plan is to continue plavix and aspirin.  - SNF placement on d/c, discussed with pt and Education officer, museum. S/P successful balloon PTA of the left SFA and left popliteal artery by Dr. Trula Slade, appreciate his help. SNF - cont plavix and ASA; OT/PT  Renal failure (ARF), acute on chronic Baseline creatinine probably in the 1.2-1.5 range. Admitted with creatinine of 2.3.  - Most likely related to poor oral intake and dehydration.  - No hydronephrosis by CT renal stone study.  - resolved with IV fluid rehydration. SNF- will check BMP in several days  Hyperkalemia Secondary to acute on chronic kidney disease. Also on ACEI/lisinopril and potassium supplements at home - Provided a dose of Kayexalate 8/24.  - Resolved.  SNF - recheck K in several days  Metabolic acidosis Improved. Discontinued bicarbonate drip. SNF - check BMP in several days to re-assess  Urinary retention Status post Foley catheter placement. Continue Flomax. SNF - voiding trial   Acute encephalopathy Secondary to metabolic derangement (acute renal failure) and opioids. Resolved. - CT head without acute findings.  Type 2 diabetes mellitus with hyperosmolar nonketotic hyperglycemia Hypoglycemic episodes with CBG of 41 on 11/01/2014. CBG 59 on 11/02/2014. D/C'd 70/30.   - discharge on diabetic diet. If blood sugars persistently elevated may consider continuing her prior 70/30 regimen; SNF - CBG ac and q HS, will be actively titrating with pt not acutely ill and on more regular diet  HTN (hypertension) SNF - cont imdur, metoprolol, hydralazine, lisinopril, lasix  CVA (cerebral infarction) SNF -Continue aspirin and Plavix for secondary stroke prevention    Time spent > 45 min; > 50% of time with patient was spent reviewing records, labs, tests and studies, counseling and developing plan of care  Hennie Duos, MD

## 2014-11-14 DIAGNOSIS — E872 Acidosis, unspecified: Secondary | ICD-10-CM | POA: Insufficient documentation

## 2014-11-14 NOTE — Assessment & Plan Note (Signed)
Improved. Discontinued bicarbonate drip. SNF - check BMP in several days to re-assess

## 2014-11-14 NOTE — Assessment & Plan Note (Signed)
Status post Foley catheter placement. Continue Flomax. SNF - voiding trial

## 2014-11-14 NOTE — Assessment & Plan Note (Signed)
Secondary to acute on chronic kidney disease. Also on ACEI/lisinopril and potassium supplements at home - Provided a dose of Kayexalate 8/24.  - Resolved.  SNF - recheck K in several days

## 2014-11-14 NOTE — Assessment & Plan Note (Signed)
Baseline creatinine probably in the 1.2-1.5 range. Admitted with creatinine of 2.3.  - Most likely related to poor oral intake and dehydration.  - No hydronephrosis by CT renal stone study.  - resolved with IV fluid rehydration. SNF- will check BMP in several days

## 2014-11-14 NOTE — Assessment & Plan Note (Signed)
SNF - cont imdur, metoprolol, hydralazine, lisinopril, lasix

## 2014-11-14 NOTE — Assessment & Plan Note (Signed)
No reported trauma. Picture not consistent with peripheral neuropathy or radiculopathy pain. X-ray negative for fractures. Lower extremity venous Dopplers negative.  - Vascular/Dr Einar Gip on board while patient was in house. - Plan is to continue plavix and aspirin.  - SNF placement on d/c, discussed with pt and Education officer, museum. S/P successful balloon PTA of the left SFA and left popliteal artery by Dr. Trula Slade, appreciate his help. SNF - cont plavix and ASA; OT/PT

## 2014-11-14 NOTE — Assessment & Plan Note (Signed)
Hypoglycemic episodes with CBG of 41 on 11/01/2014. CBG 59 on 11/02/2014. D/C'd 70/30.  - discharge on diabetic diet. If blood sugars persistently elevated may consider continuing her prior 70/30 regimen; SNF - CBG ac and q HS, will be actively titrating with pt not acutely ill and on more regular diet

## 2014-11-14 NOTE — Assessment & Plan Note (Signed)
SNF -Continue aspirin and Plavix for secondary stroke prevention

## 2014-11-14 NOTE — Assessment & Plan Note (Signed)
Secondary to metabolic derangement (acute renal failure) and opioids. Resolved. - CT head without acute findings.

## 2014-11-18 LAB — HEPATIC FUNCTION PANEL
ALK PHOS: 69 U/L (ref 25–125)
ALT: 30 U/L (ref 7–35)
AST: 31 U/L (ref 13–35)
Bilirubin, Total: 0.2 mg/dL

## 2014-11-18 LAB — BASIC METABOLIC PANEL
BUN: 16 mg/dL (ref 4–21)
Creatinine: 1.3 mg/dL — AB (ref <=1.1)
GLUCOSE: 74 mg/dL
POTASSIUM: 3.6 mmol/L (ref 3.4–5.3)
Sodium: 139 mmol/L (ref 137–147)

## 2014-11-18 LAB — CBC AND DIFFERENTIAL
HCT: 30 % — AB (ref 36–46)
HEMOGLOBIN: 9.5 g/dL — AB (ref 12.0–16.0)
Platelets: 227 10*3/uL (ref 150–399)
WBC: 4.6 10^3/mL

## 2014-11-22 ENCOUNTER — Encounter: Payer: Self-pay | Admitting: Nurse Practitioner

## 2014-11-22 ENCOUNTER — Non-Acute Institutional Stay (SKILLED_NURSING_FACILITY): Payer: Medicare Other | Admitting: Nurse Practitioner

## 2014-11-22 DIAGNOSIS — E1101 Type 2 diabetes mellitus with hyperosmolarity with coma: Secondary | ICD-10-CM

## 2014-11-22 DIAGNOSIS — R531 Weakness: Secondary | ICD-10-CM

## 2014-11-22 DIAGNOSIS — I998 Other disorder of circulatory system: Secondary | ICD-10-CM | POA: Diagnosis not present

## 2014-11-22 DIAGNOSIS — N179 Acute kidney failure, unspecified: Secondary | ICD-10-CM

## 2014-11-22 DIAGNOSIS — N189 Chronic kidney disease, unspecified: Secondary | ICD-10-CM

## 2014-11-22 DIAGNOSIS — E1100 Type 2 diabetes mellitus with hyperosmolarity without nonketotic hyperglycemic-hyperosmolar coma (NKHHC): Secondary | ICD-10-CM

## 2014-11-22 DIAGNOSIS — R339 Retention of urine, unspecified: Secondary | ICD-10-CM

## 2014-11-22 DIAGNOSIS — I10 Essential (primary) hypertension: Secondary | ICD-10-CM

## 2014-11-22 NOTE — Progress Notes (Signed)
Nursing Home Location:  [facility]   Place of Service: SNF (31)  PCP: Maximino Greenland, MD  Allergies  Allergen Reactions  . Peanuts [Peanut Oil] Swelling  . Norvasc [Amlodipine Besylate] Swelling  . Peanut-Containing Drug Products Other (See Comments)    Cause my stomach to hurt    Chief Complaint  Patient presents with  . Discharge Note    HPI:  Patient is a 81 y.o. female seen today at [facility] for discharge home. Pt with a pmh of whohistory of HTN, IDDM/DM 2, CVA with right upper extremity contracture, CKD3, chronic low back pain, CAD, PAD, PPM, history of intermittent left leg pain. Pt was admitted to hospital from 8/22-9/1 where a peripheral arteriogram done after resolution of AKI showed dx and pt underwent a successful balloon PTA of L SFA and L popliteal artery. Pt's hospital course was complicated by acute encephalopathy, hypoglycemic episodes and metabolic acidosis. Pt was admitted to snf for rehab due to generalized weakness. pts 70/30 was stopped during hospitalization due to hypoglcemia however blood sugars were in the high 300s so restarted at low dose. Since restarted blood sugars have ranged from 60-300. Pt also noted to have elevated blood pressures and lisinopril recently increased. Patient currently doing well with therapy, now stable to discharge home with home health.  Review of Systems:  Review of Systems  Constitutional: Negative for fever, chills and diaphoresis.  HENT: Negative for tinnitus.   Respiratory: Negative for shortness of breath.   Cardiovascular: Negative for chest pain and leg swelling.  Gastrointestinal: Negative for nausea, vomiting and abdominal pain.  Musculoskeletal: Negative for myalgias, back pain and arthralgias.  Neurological: Negative for dizziness, weakness and headaches.    Past Medical History  Diagnosis Date  . Hypertension   . Coronary artery disease   . Renal disorder   . Herniated  lumbar intervertebral disc   . Cataract     "both eyes; blood pressure's always too high to have them fixed" (09/11/2014)  . Renovascular hypertension      s/p left renal artery stent 12/2007.  S/P balloon angioplasty on 02/16/10 for ISR, BP was  controlled well since then.     . Claudication in peripheral vascular disease     02/16/10: Left CIA 9.0x28 Omnilink and REIA 8.0x40 seff expanding Zilver.   right subclavian artery stent 03/18/2008,  . Peripheral vascular disease   . Anxiety   . Pacemaker   . Pneumonia X 2  . IDDM (insulin dependent diabetes mellitus)   . Migraine     "used to have terrible migraines; stopped in the 1990's"  . Stroke 1999    Stroke and TIA in 1999 with right sided weakness, now with residual right arm weakness (09/11/2014)  . Arthritis     "everywhere"  . Chronic lower back pain    Past Surgical History  Procedure Laterality Date  . Appendectomy    . Ureteral stent placement    . Carotid endarterectomy Left 1991     Stroke and TIA in 1999 with right sided weakness, now with residual right arm weakness  . Lumbar laminectomy      'herniated disc"  . Back surgery    . Colonoscopy  07/30/2011    Procedure: COLONOSCOPY;  Surgeon: Inda Castle, MD;  Location: [place];  Service: Endoscopy;  Laterality: N/A;  gi bleed  . Esophagogastroduodenoscopy  07/30/2011    Procedure: ESOPHAGOGASTRODUODENOSCOPY (EGD);  Surgeon: Inda Castle, MD;  Location: [facility];  Service: Endoscopy;  Laterality: N/A;  . Permanent pacemaker insertion Left 06/28/2011    Procedure: PERMANENT PACEMAKER INSERTION;  Surgeon: Deboraha Sprang, MD;  Location: [facility] CATH LAB;  Service: Cardiovascular;  Laterality: Left;  . Abdominal angiogram N/A 07/06/2011    Procedure: ABDOMINAL ANGIOGRAM;  Surgeon: Laverda Page, MD;  Location: [facility] CATH LAB;  Service: Cardiovascular;  Laterality: N/A;  . Renal angiogram N/A 07/06/2011    Procedure: RENAL ANGIOGRAM;  Surgeon: Laverda Page, MD;   Location: [facility] CATH LAB;  Service: Cardiovascular;  Laterality: N/A;  . Lower extremity angiogram Right 05/29/2012    Procedure: LOWER EXTREMITY ANGIOGRAM;  Surgeon: Laverda Page, MD;  Location: [facility] CATH LAB;  Service: Cardiovascular;  Laterality: Right;  . Tonsillectomy    . Insert / replace / remove pacemaker    . Excisional hemorrhoidectomy    . Carpal tunnel release Left   . Vaginal hysterectomy    . Peripheral vascular catheterization N/A 11/05/2014    Procedure: Abdominal Aortogram;  Surgeon: Serafina Mitchell, MD;  Location: [place] CV LAB;  Service: Cardiovascular;  Laterality: N/A;   Social History:   reports that she has been smoking Cigarettes.  She has been smoking about 0.00 packs per day for the past 70 years. She has never used smokeless tobacco. She reports that she drinks alcohol. She reports that she does not use illicit drugs.  Family History  Problem Relation Age of Onset  . Cancer Father   . Heart attack Mother     Medications: Patient's Medications  New Prescriptions   No medications on file  Previous Medications   ASPIRIN EC 81 MG TABLET    Take 81 mg by mouth daily.   CLOPIDOGREL (PLAVIX) 75 MG TABLET    Take 75 mg by mouth daily.   DIAZEPAM (VALIUM) 5 MG TABLET    Take 1 tablet (5 mg total) by mouth 2 (two) times daily.   FUROSEMIDE (LASIX) 20 MG TABLET    Take 1 tablet (20 mg total) by mouth daily.   HYDRALAZINE (APRESOLINE) 100 MG TABLET    Take 1 tablet (100 mg total) by mouth 3 (three) times daily.   INSULIN NPH-REGULAR HUMAN (NOVOLIN 70/30) (70-30) 100 UNIT/ML INJECTION    Inject 5 Units into the skin 2 (two) times daily.   ISOSORBIDE MONONITRATE (IMDUR) 120 MG 24 HR TABLET    Take 1 tablet (120 mg total) by mouth daily.   LISINOPRIL (ZESTRIL) 10 MG TABLET    Take 1 tablet (10 mg total) by mouth daily.   METOPROLOL TARTRATE 75 MG TABS    Take 75 mg by mouth 2 (two) times daily.   OMEGA-3 FATTY ACIDS (FISH OIL PEARLS) 300 MG CAPS    Take 1 tablet by  mouth 2 (two) times daily.   OXYCODONE-ACETAMINOPHEN (PERCOCET/ROXICET) 5-325 MG PER TABLET    Take 2 tablets by mouth every 6 (six) hours as needed for moderate pain.   POTASSIUM CHLORIDE SA (K-DUR,KLOR-CON) 10 MEQ TABLET    Take 1 tablet (10 mEq total) by mouth daily.   TAMSULOSIN (FLOMAX) 0.4 MG CAPS CAPSULE    Take 1 capsule (0.4 mg total) by mouth daily after supper.  Modified Medications   No medications on file  Discontinued Medications   No medications on file     Physical Exam: Filed Vitals:   11/22/14 1550  BP: 155/55  Pulse: 60  Temp: 97 F (36.1 C)  Resp: 20  Physical Exam  Constitutional: She is oriented to person, place, and time. She appears well-developed and well-nourished. No distress.  HENT:  Head: Normocephalic and atraumatic.  Mouth/Throat: Oropharynx is clear and moist. No oropharyngeal exudate.  Eyes: Conjunctivae are normal. Pupils are equal, round, and reactive to light.  Neck: Normal range of motion. Neck supple.  Cardiovascular: Normal rate, regular rhythm and normal heart sounds.   Pulmonary/Chest: Effort normal and breath sounds normal.  Abdominal: Soft. Bowel sounds are normal.  Musculoskeletal: She exhibits no edema or tenderness.  Neurological: She is alert and oriented to person, place, and time.  Skin: Skin is warm and dry. She is not diaphoretic.  Psychiatric: She has a normal mood and affect.    Labs reviewed: Basic Metabolic Panel:  Recent Labs  09/11/14 1522 09/11/14 2200  09/13/14 0234  11/04/14 0256 11/05/14 0308 11/06/14 0251 11/18/14  NA 139  --   < > 141  < > 133* 133* 131* 139  K 2.8*  --   < > 2.8*  < > 4.9 4.4 4.5 3.6  CL 106  --   < > 109  < > 105 104 104  --   CO2 24  --   < > 24  < > 22 23 20*  --   GLUCOSE 145*  --   < > 68  < > 141* 174* 186*  --   BUN 14  --   < > 20  < > 20 20 20 16   CREATININE 1.28*  --   < > 1.39*  < > 1.24* 1.28* 1.09* 1.3*  CALCIUM 7.5*  --   < > 7.6*  < > 8.3* 8.4* 7.9*  --   MG 1.7  1.5*  --  1.8  --   --   --   --   --   < > = values in this interval not displayed. Liver Function Tests:  Recent Labs  09/11/14 1522 10/28/14 1649 11/18/14  AST 22 27 31   ALT 22 36 30  ALKPHOS 96 62 69  BILITOT 0.4 0.6  --   PROT 6.7 7.1  --   ALBUMIN 2.7* 3.9  --    No results for input(s): LIPASE, AMYLASE in the last 8760 hours. No results for input(s): AMMONIA in the last 8760 hours. CBC:  Recent Labs  10/28/14 1245  11/05/14 0308 11/06/14 0251 11/07/14 0818 11/18/14  WBC 5.9  < > 5.3 3.9* 3.7* 4.6  NEUTROABS 4.6  --   --   --   --   --   HGB 15.0  < > 10.6* 9.7* 10.6* 9.5*  HCT 46.3*  < > 32.1* 29.1* 31.0* 30*  MCV 98.3  < > 94.1 95.7 93.9  --   PLT 180  < > 175 201 195 227  < > = values in this interval not displayed. TSH: No results for input(s): TSH in the last 8760 hours. A1C: Lab Results  Component Value Date   HGBA1C 8.7* 09/11/2014   Lipid Panel: No results for input(s): CHOL, HDL, LDLCALC, TRIG, CHOLHDL, LDLDIRECT in the last 8760 hours.  Radiological Exams: Ct Head Wo Contrast  10/28/2014   CLINICAL DATA:  Altered mental status with weakness. Took Ativan and Percocet.  EXAM: CT HEAD WITHOUT CONTRAST  TECHNIQUE: Contiguous axial images were obtained from the base of the skull through the vertex without intravenous contrast.  COMPARISON:  06/02/2013 and 04/30/2012  FINDINGS: Ventricles, cisterns and other CSF spaces are within  normal. There is evidence of chronic ischemic microvascular disease. Chronic stable patchy low-attenuation and linear high density over the left frontal region, basal ganglia and insular cortex. No mass, mass effect, shift of midline structures or acute hemorrhage. No evidence of acute infarction. Subtle patchy low-attenuation over the pons which is stable. Remainder of the exam is unremarkable.  IMPRESSION: No acute intracranial findings.  Chronic ischemic microvascular disease and chronic change in the left frontal/insular region and  basal ganglia.   Electronically Signed   By: Marin Olp M.D.   On: 10/28/2014 14:55   Dg Tibia/fibula Left Port  10/29/2014   CLINICAL DATA:  Patient with generalized lower extremity pain. No reported injury. Initial encounter.  EXAM: PORTABLE LEFT TIBIA AND FIBULA - 2 VIEW  COMPARISON:  None.  FINDINGS: Normal anatomic alignment. No evidence for acute fracture or dislocation. Regional soft tissues demonstrate vascular calcifications. Midfoot degenerative changes.  IMPRESSION: No acute osseous abnormality.   Electronically Signed   By: Lovey Newcomer M.D.   On: 10/29/2014 16:05   Ct Renal Stone Study  10/28/2014   CLINICAL DATA:  Hematuria, weakness.  Altered mental status.  EXAM: CT ABDOMEN AND PELVIS WITHOUT CONTRAST  TECHNIQUE: Multidetector CT imaging of the abdomen and pelvis was performed following the standard protocol without IV contrast.  COMPARISON:  04/18/2012  FINDINGS: Calcified granuloma in the left lower lobe. Lung bases are clear. No effusions. Heart is normal size. No pleural effusions. Heart is normal size. Densely calcified coronary arteries. Pacer wires partially imaged in the right heart.  Liver, gallbladder, adrenals are unremarkable. Bilateral renal cysts. Punctate calcifications in the lower pole of the left kidney. No hydronephrosis. Calcifications in the spleen compatible with old granulomas disease. Pancreas is atrophic. Pancreatic calcifications noted compatible with chronic pancreatitis. This is stable.  Aorta and iliac vessels are heavily calcified as are the branch vessels. No aneurysm. Large stool burden in the colon, particularly rectosigmoid colon suggesting fecal impaction. Small bowel and stomach decompressed, grossly unremarkable. No free fluid, free air or adenopathy.  Prior hysterectomy. No visible adnexal masses. No free fluid, free air or adenopathy.  No acute bony abnormality or focal bone lesion. Postoperative changes in the lower lumbar spine.  IMPRESSION: Changes of  chronic pancreatitis.  Old granulomas disease in the left lower lobe and spleen.  Coronary artery disease.  Large stool burden throughout the colon, particularly rectosigmoid colon suggesting fecal impaction.   Electronically Signed   By: Rolm Baptise M.D.   On: 10/28/2014 14:52    Assessment/Plan 1. Lower limb ischemia; LLE -pain significantly improved s/p successful balloon PTA of the left SFA and left popliteal artery by Dr. Trula Slade. - cont plavix and ASA  2. Renal failure (ARF), acute on chronic Improved, Baseline creatinine in the 1.2-1.5 range, will need further follow up by PCP  3. Urinary retention Has improved, conts on flomax daily   4. Type 2 diabetes mellitus with hyperosmolar nonketotic hyperglycemia Blood sugars ranging from 60-300. Will stop 70/30 due to risk of hypoglycemia and start tranjenta 5 mg daily  -will need outpatient follow up on blood sugars   5. Essential hypertension -elevated blood pressure noted, conts on hydralazine 100 mg TID, imdur 120 daily, metoprolol 75 mg BID and recently with increase in lisinopril to 20 mg daily  -will need PCP to follow up as outpatient  6. Weakness generalized -has improved and stable for discharge-will need PT/OT per home health. DME needed includes standard WC. Rx written.  will need  to follow up with PCP within 2 weeks.     Carlos American. Harle Battiest  [facility] [phone] 8 am - 5 pm) [phone] (after hours)

## 2014-12-01 MED ORDER — LINAGLIPTIN 5 MG PO TABS: 5.0000 mg | ORAL_TABLET | Freq: Every day | ORAL | Status: DC

## 2014-12-06 ENCOUNTER — Encounter (HOSPITAL_COMMUNITY): Payer: Self-pay | Admitting: *Deleted

## 2014-12-06 ENCOUNTER — Emergency Department (HOSPITAL_COMMUNITY)
Admission: EM | Admit: 2014-12-06 | Discharge: 2014-12-06 | Disposition: A | Discharge status: Home / Self Care | Payer: Medicare Other | Attending: Emergency Medicine | Admitting: Emergency Medicine

## 2014-12-06 DIAGNOSIS — E876 Hypokalemia: Secondary | ICD-10-CM | POA: Insufficient documentation

## 2014-12-06 DIAGNOSIS — Z8701 Personal history of pneumonia (recurrent): Secondary | ICD-10-CM | POA: Diagnosis not present

## 2014-12-06 DIAGNOSIS — Z7901 Long term (current) use of anticoagulants: Secondary | ICD-10-CM | POA: Diagnosis not present

## 2014-12-06 DIAGNOSIS — Z9889 Other specified postprocedural states: Secondary | ICD-10-CM | POA: Diagnosis not present

## 2014-12-06 DIAGNOSIS — M25511 Pain in right shoulder: Secondary | ICD-10-CM | POA: Diagnosis not present

## 2014-12-06 DIAGNOSIS — I2510 Atherosclerotic heart disease of native coronary artery without angina pectoris: Secondary | ICD-10-CM | POA: Diagnosis not present

## 2014-12-06 DIAGNOSIS — Z87448 Personal history of other diseases of urinary system: Secondary | ICD-10-CM | POA: Insufficient documentation

## 2014-12-06 DIAGNOSIS — E119 Type 2 diabetes mellitus without complications: Secondary | ICD-10-CM | POA: Diagnosis not present

## 2014-12-06 DIAGNOSIS — Z7982 Long term (current) use of aspirin: Secondary | ICD-10-CM | POA: Diagnosis not present

## 2014-12-06 DIAGNOSIS — Z79899 Other long term (current) drug therapy: Secondary | ICD-10-CM | POA: Insufficient documentation

## 2014-12-06 DIAGNOSIS — Z950 Presence of cardiac pacemaker: Secondary | ICD-10-CM | POA: Diagnosis not present

## 2014-12-06 DIAGNOSIS — Z8679 Personal history of other diseases of the circulatory system: Secondary | ICD-10-CM | POA: Diagnosis not present

## 2014-12-06 DIAGNOSIS — Z8739 Personal history of other diseases of the musculoskeletal system and connective tissue: Secondary | ICD-10-CM | POA: Insufficient documentation

## 2014-12-06 DIAGNOSIS — I10 Essential (primary) hypertension: Secondary | ICD-10-CM | POA: Diagnosis not present

## 2014-12-06 DIAGNOSIS — Z794 Long term (current) use of insulin: Secondary | ICD-10-CM | POA: Diagnosis not present

## 2014-12-06 DIAGNOSIS — Z8673 Personal history of transient ischemic attack (TIA), and cerebral infarction without residual deficits: Secondary | ICD-10-CM | POA: Insufficient documentation

## 2014-12-06 DIAGNOSIS — Z720 Tobacco use: Secondary | ICD-10-CM | POA: Diagnosis not present

## 2014-12-06 DIAGNOSIS — M545 Low back pain: Secondary | ICD-10-CM | POA: Insufficient documentation

## 2014-12-06 DIAGNOSIS — H269 Unspecified cataract: Secondary | ICD-10-CM | POA: Insufficient documentation

## 2014-12-06 DIAGNOSIS — M1990 Unspecified osteoarthritis, unspecified site: Secondary | ICD-10-CM | POA: Insufficient documentation

## 2014-12-06 DIAGNOSIS — G8929 Other chronic pain: Secondary | ICD-10-CM | POA: Diagnosis not present

## 2014-12-06 DIAGNOSIS — F419 Anxiety disorder, unspecified: Secondary | ICD-10-CM | POA: Diagnosis not present

## 2014-12-06 DIAGNOSIS — G43909 Migraine, unspecified, not intractable, without status migrainosus: Secondary | ICD-10-CM | POA: Insufficient documentation

## 2014-12-06 LAB — CBC
HEMATOCRIT: 33.3 % — AB (ref 36.0–46.0)
HEMOGLOBIN: 11.1 g/dL — AB (ref 12.0–15.0)
MCH: 33.7 pg (ref 26.0–34.0)
MCHC: 33.3 g/dL (ref 30.0–36.0)
MCV: 101.2 fL — AB (ref 78.0–100.0)
Platelets: 194 10*3/uL (ref 150–400)
RBC: 3.29 MIL/uL — AB (ref 3.87–5.11)
RDW: 16.9 % — ABNORMAL HIGH (ref 11.5–15.5)
WBC: 2.6 10*3/uL — AB (ref 4.0–10.5)

## 2014-12-06 LAB — BASIC METABOLIC PANEL
ANION GAP: 6 (ref 5–15)
BUN: 28 mg/dL — AB (ref 6–20)
CHLORIDE: 112 mmol/L — AB (ref 101–111)
CO2: 23 mmol/L (ref 22–32)
Calcium: 7.7 mg/dL — ABNORMAL LOW (ref 8.9–10.3)
Creatinine, Ser: 1.4 mg/dL — ABNORMAL HIGH (ref 0.44–1.00)
GFR calc Af Amer: 40 mL/min — ABNORMAL LOW (ref >60)
GFR, EST NON AFRICAN AMERICAN: 35 mL/min — AB (ref >60)
GLUCOSE: 282 mg/dL — AB (ref 65–99)
POTASSIUM: 2.7 mmol/L — AB (ref 3.5–5.1)
Sodium: 141 mmol/L (ref 135–145)

## 2014-12-06 MED ORDER — OXYCODONE-ACETAMINOPHEN 5-325 MG PO TABS
1.0000 | ORAL_TABLET | Freq: Once | ORAL | Status: AC
  Administered 2014-12-06: 1 via ORAL
  Filled 2014-12-06: qty 1

## 2014-12-06 MED ORDER — POTASSIUM CHLORIDE ER 10 MEQ PO TBCR: 10.0000 meq | EXTENDED_RELEASE_TABLET | Freq: Two times a day (BID) | ORAL | Status: DC

## 2014-12-06 MED ORDER — HYDROCODONE-ACETAMINOPHEN 5-325 MG PO TABS
1.0000 | ORAL_TABLET | Freq: Once | ORAL | Status: AC
  Administered 2014-12-06: 1 via ORAL
  Filled 2014-12-06: qty 1

## 2014-12-06 MED ORDER — ACETAMINOPHEN-CODEINE #4 300-60 MG PO TABS: 1.0000 | ORAL_TABLET | Freq: Four times a day (QID) | ORAL | Status: DC | PRN

## 2014-12-06 MED ORDER — POTASSIUM CHLORIDE CRYS ER 20 MEQ PO TBCR
40.0000 meq | EXTENDED_RELEASE_TABLET | Freq: Once | ORAL | Status: AC
  Administered 2014-12-06: 40 meq via ORAL
  Filled 2014-12-06: qty 2

## 2014-12-06 NOTE — ED Notes (Signed)
MD notified of abnormal lab

## 2014-12-06 NOTE — ED Notes (Signed)
Per EMS, pt from home.  Pt reports chronic low back and R shoulder pain which she's had for 20 years.   Worse today.  Pt is wearing a sling for comfort.  Denies new injury.

## 2014-12-06 NOTE — Care Management Note (Addendum)
Case Management Note  Patient Details  Name: Carmen [name] MRN: [id number] Date of Birth: [birth year]  Subjective/Objective: Patient presents to Ed with increased back and shoulder pain                   Action/Plan: Home with home health services   Expected Discharge Date:   12/06/2014               Expected Discharge Plan:  [place]  In-House Referral:     Discharge planning Services  CM Consult  Post Acute Care Choice:  Home Health Choice offered to:  Patient  DME Arranged:   (none required per patient) DME Agency:     HH Arranged:  RN, PT, OT, Social Work [employer] Agency:  [place]  Status of Service:  Completed, signed off  Medicare Important Message Given:    Date Medicare IM Given:    Medicare IM give by:    Date Additional Medicare IM Given:    Additional Medicare Important Message give by:     If discussed at [place] of Stay Meetings, dates discussed:    Additional Comments: EDCM consulted by EDP regarding home health needs. EDCM spoke to patient and her daughter [name] at bedside.  Patient lives at home with her son and husband.  Patient reports she has had [facility] in the past and was pleased with this agency.  Patient is agreeable to receive home health RN, PT, OT and social worker.  Patient confirms her pcp is Dr. Glendale Chard.  Patient has a walker, bedside commode and shower bench at home.  Patient denies need for further dme.  Patient's daughter reports patient has a side rail for the patient's bed at home, as the patient can only use one arm, other arm contracted by patient's daughter.  Patient's daughter reports patient has applied for Medicaid in the past but has been denied.  EDCM offered private duty nursing list for more assistance at home, however, patient refused as she is unable to afford this.  EDCM provided patient with list of home health agencies in [place] highlighting [facility].  EDCM explained to patient agency ofher  choice has 24-48 hours to contact her.  Discussed patient with EDP who will place homehealth orders for RN, PT, OT and Education officer, museum.  Patient and patient's daughter thankful for services.  No further EDCM needs at this time.  Patient's son Montine Circle phone number [phone], patient's husband phone number [phone], grand daughter Synetta Shadow [phone] and daughter [name] phone number [phone].  [facility] faxed referral for home health orders to [facility] with confirmation of receipt.   12/09/2014 A. Ferrero RNCM 1711pm  EDCM spoke to patient's daughter [name] who reports she ha not received a phone call from [facility].  EDCM placed call to [facility] and spoke to Tavia who reports someone will be coming out to see the patient tomorrow.  Made patient's daughter [name] aware. Christen Bame, AMY, RN 12/06/2014, 5:26 PM

## 2014-12-06 NOTE — ED Notes (Signed)
While this nurse was triaging pt, pt's family is shaving pt's facial hair.  She reports pt was in the hospital at [facility] x 2 weeks ago and had an angioplasty done.  Was sent to [facility] facility for rehab.  States that pt feels like she was discharged too soon.  Pt reports she is not able to get OOB today d/t her chronic low back and R shoulder pain which she rates >10/10.  Pt is ambulatory.

## 2014-12-06 NOTE — ED Provider Notes (Signed)
CSN: [id number]     Arrival date & time 12/06/14  1415 History   First MD Initiated Contact with Patient 12/06/14 1514     Chief Complaint  Patient presents with  . Back Pain  . Shoulder Pain      HPI Patient presents the emergency department complaining of 3-4 months of right shoulder pain as well as a chronic history of low back pain.  She's had ongoing discomfort and pain is having difficulties getting into her primary care physician secondary to difficulty with the co-pay.  She states that her pain was more severe today and made it difficult to ambulate.  She does have a walker at home.  She is in the ER with her daughter.  Denies fevers and chills.  Denies urinary symptoms.  Denies chest pain shortness breath.  No nausea vomiting or diarrhea.  Appetite has been normal.  No significant weight changes.  No other changes in her medications.    Past Medical History  Diagnosis Date  . Hypertension   . Coronary artery disease   . Renal disorder   . Herniated lumbar intervertebral disc   . Cataract     "both eyes; blood pressure's always too high to have them fixed" (09/11/2014)  . Renovascular hypertension      s/p left renal artery stent 12/2007.  S/P balloon angioplasty on 02/16/10 for ISR, BP was  controlled well since then.     . Claudication in peripheral vascular disease     02/16/10: Left CIA 9.0x28 Omnilink and REIA 8.0x40 seff expanding Zilver.   right subclavian artery stent 03/18/2008,  . Peripheral vascular disease   . Anxiety   . Pacemaker   . Pneumonia X 2  . IDDM (insulin dependent diabetes mellitus)   . Migraine     "used to have terrible migraines; stopped in the 1990's"  . Stroke 1999    Stroke and TIA in 1999 with right sided weakness, now with residual right arm weakness (09/11/2014)  . Arthritis     "everywhere"  . Chronic lower back pain    Past Surgical History  Procedure Laterality Date  . Appendectomy    . Ureteral stent placement    . Carotid  endarterectomy Left 1991     Stroke and TIA in 1999 with right sided weakness, now with residual right arm weakness  . Lumbar laminectomy      'herniated disc"  . Back surgery    . Colonoscopy  07/30/2011    Procedure: COLONOSCOPY;  Surgeon: Inda Castle, MD;  Location: [facility];  Service: Endoscopy;  Laterality: N/A;  gi bleed  . Esophagogastroduodenoscopy  07/30/2011    Procedure: ESOPHAGOGASTRODUODENOSCOPY (EGD);  Surgeon: Inda Castle, MD;  Location: [facility];  Service: Endoscopy;  Laterality: N/A;  . Permanent pacemaker insertion Left 06/28/2011    Procedure: PERMANENT PACEMAKER INSERTION;  Surgeon: Deboraha Sprang, MD;  Location: [facility] CATH LAB;  Service: Cardiovascular;  Laterality: Left;  . Abdominal angiogram N/A 07/06/2011    Procedure: ABDOMINAL ANGIOGRAM;  Surgeon: Laverda Page, MD;  Location: [facility] CATH LAB;  Service: Cardiovascular;  Laterality: N/A;  . Renal angiogram N/A 07/06/2011    Procedure: RENAL ANGIOGRAM;  Surgeon: Laverda Page, MD;  Location: [facility] CATH LAB;  Service: Cardiovascular;  Laterality: N/A;  . Lower extremity angiogram Right 05/29/2012    Procedure: LOWER EXTREMITY ANGIOGRAM;  Surgeon: Laverda Page, MD;  Location: [facility] CATH LAB;  Service: Cardiovascular;  Laterality: Right;  . Tonsillectomy    .  Insert / replace / remove pacemaker    . Excisional hemorrhoidectomy    . Carpal tunnel release Left   . Vaginal hysterectomy    . Peripheral vascular catheterization N/A 11/05/2014    Procedure: Abdominal Aortogram;  Surgeon: Serafina Mitchell, MD;  Location: [place] CV LAB;  Service: Cardiovascular;  Laterality: N/A;   Family History  Problem Relation Age of Onset  . Cancer Father   . Heart attack Mother    Social History  Substance Use Topics  . Smoking status: Current Every Day Smoker -- 0.00 packs/day for 70 years    Types: Cigarettes  . Smokeless tobacco: Never Used     Comment: 09/11/2014 "I used to smoke very heavy; down to 1  cigarette/day"  . Alcohol Use: Yes     Comment: "stopped drinking back in the 1970's"   OB History    No data available     Review of Systems  All other systems reviewed and are negative.     Allergies  Peanuts; Norvasc; and Peanut-containing drug products  Home Medications   Prior to Admission medications   Medication Sig Start Date End Date Taking? Authorizing Chrystal Zeimet  aspirin EC 81 MG tablet Take 81 mg by mouth daily.   Yes Historical Tandy Lewin, MD  clopidogrel (PLAVIX) 75 MG tablet Take 75 mg by mouth daily.   Yes Historical Lilleigh Hechavarria, MD  diazepam (VALIUM) 5 MG tablet Take 1 tablet (5 mg total) by mouth 2 (two) times daily. 11/07/14  Yes Velvet Bathe, MD  furosemide (LASIX) 20 MG tablet Take 1 tablet (20 mg total) by mouth daily. 11/07/14  Yes Velvet Bathe, MD  hydrALAZINE (APRESOLINE) 100 MG tablet Take 1 tablet (100 mg total) by mouth 3 (three) times daily. Patient taking differently: Take 100 mg by mouth every 8 (eight) hours.  09/16/14  Yes Charlynne Cousins, MD  isosorbide mononitrate (IMDUR) 120 MG 24 hr tablet Take 1 tablet (120 mg total) by mouth daily. 12/12/12  Yes Annita Brod, MD  linagliptin (TRADJENTA) 5 MG TABS tablet Take 1 tablet (5 mg total) by mouth daily. 12/01/14  Yes Lauree Chandler, NP  lisinopril (ZESTRIL) 10 MG tablet Take 1 tablet (10 mg total) by mouth daily. Patient taking differently: Take 20 mg by mouth daily.  09/19/14  Yes Charlynne Cousins, MD  metoprolol tartrate 75 MG TABS Take 75 mg by mouth 2 (two) times daily. 11/07/14  Yes Velvet Bathe, MD  NOVOLIN 70/30 (70-30) 100 UNIT/ML injection Inject 8 Units into the skin 2 (two) times daily.  11/10/14  Yes Historical Alethia Melendrez, MD  Omega-3 Fatty Acids (FISH OIL PEARLS) 300 MG CAPS Take 1 tablet by mouth 2 (two) times daily.   Yes Historical Seneca Gadbois, MD  potassium chloride SA (K-DUR,KLOR-CON) 10 MEQ tablet Take 1 tablet (10 mEq total) by mouth daily. 08/10/13  Yes Ripudeep Krystal Eaton, MD  tamsulosin  (FLOMAX) 0.4 MG CAPS capsule Take 1 capsule (0.4 mg total) by mouth daily after supper. 11/07/14  Yes Velvet Bathe, MD  oxyCODONE-acetaminophen (PERCOCET/ROXICET) 5-325 MG per tablet Take 2 tablets by mouth every 6 (six) hours as needed for moderate pain. Patient not taking: Reported on 12/06/2014 11/07/14   Velvet Bathe, MD   BP 217/77 mmHg  Pulse 61  Temp(Src) 98 F (36.7 C) (Oral)  Resp 18  SpO2 93% Physical Exam  Constitutional: She is oriented to person, place, and time. She appears well-developed and well-nourished. No distress.  HENT:  Head: Normocephalic and atraumatic.  Eyes: EOM are normal.  Neck: Normal range of motion.  Cardiovascular: Normal rate, regular rhythm and normal heart sounds.   Pulmonary/Chest: Effort normal and breath sounds normal.  Abdominal: Soft. She exhibits no distension. There is no tenderness.  Musculoskeletal: Normal range of motion.  Limited range of motion of the right shoulder secondary to discomfort and pain.  No obvious deformity.  No rash or redness noted.  Normal right radial pulse.  Contracture of her right upper extremity noted with tightness in her right bicep.  No thoracic or lumbar point tenderness.  Mild paralumbar tenderness without lumbar step-off.  Normal strength in her lower extremities.  Normal pulses in her bilateral lower extremity.  Neurological: She is alert and oriented to person, place, and time.  Skin: Skin is warm and dry.  Psychiatric: She has a normal mood and affect. Judgment normal.  Nursing note and vitals reviewed.   ED Course  Procedures (including critical care time) Labs Review Labs Reviewed  CBC - Abnormal; Notable for the following:    WBC 2.6 (*)    RBC 3.29 (*)    Hemoglobin 11.1 (*)    HCT 33.3 (*)    MCV 101.2 (*)    RDW 16.9 (*)    All other components within normal limits  BASIC METABOLIC PANEL - Abnormal; Notable for the following:    Potassium 2.7 (*)    Chloride 112 (*)    Glucose, Bld 282 (*)     BUN 28 (*)    Creatinine, Ser 1.40 (*)    Calcium 7.7 (*)    GFR calc non Af Amer 35 (*)    GFR calc Af Amer 40 (*)    All other components within normal limits    Imaging Review No results found. I have personally reviewed and evaluated these images and lab results as part of my medical decision-making.   EKG Interpretation None      MDM   Final diagnoses:  None    Mild hypokalemia.  I don't suspect this is related to her discomfort today.  Her pain seems chronic in nature.  Of asked her to follow-up with her primary care physician regarding this.  She'll be written a short prescription of Tylenol with Codeine at her request.  She states hydrocodone and Percocet do not help her discomfort and pain and that she prefers Tylenol with Codeine.  I involve case management as well who will help assist her with home health follow-up including RN, PT, OT, social work.  She has a walker at home.  Ongoing primary care follow-up for chronic pain needs.    Jola Schmidt, MD 12/06/14 [phone]

## 2015-01-07 DIAGNOSIS — I10 Essential (primary) hypertension: Secondary | ICD-10-CM | POA: Diagnosis not present

## 2015-01-07 DIAGNOSIS — M5126 Other intervertebral disc displacement, lumbar region: Secondary | ICD-10-CM | POA: Diagnosis not present

## 2015-01-07 DIAGNOSIS — E1151 Type 2 diabetes mellitus with diabetic peripheral angiopathy without gangrene: Secondary | ICD-10-CM | POA: Diagnosis not present

## 2015-01-07 DIAGNOSIS — Z950 Presence of cardiac pacemaker: Secondary | ICD-10-CM | POA: Diagnosis not present

## 2015-01-07 DIAGNOSIS — Z720 Tobacco use: Secondary | ICD-10-CM | POA: Diagnosis not present

## 2015-01-07 DIAGNOSIS — F419 Anxiety disorder, unspecified: Secondary | ICD-10-CM | POA: Diagnosis not present

## 2015-01-07 DIAGNOSIS — M545 Low back pain: Secondary | ICD-10-CM | POA: Diagnosis not present

## 2015-01-07 DIAGNOSIS — I2510 Atherosclerotic heart disease of native coronary artery without angina pectoris: Secondary | ICD-10-CM | POA: Diagnosis not present

## 2015-01-07 DIAGNOSIS — M25511 Pain in right shoulder: Secondary | ICD-10-CM | POA: Diagnosis not present

## 2015-01-07 DIAGNOSIS — Z8673 Personal history of transient ischemic attack (TIA), and cerebral infarction without residual deficits: Secondary | ICD-10-CM | POA: Diagnosis not present

## 2015-01-10 DIAGNOSIS — M25511 Pain in right shoulder: Secondary | ICD-10-CM | POA: Diagnosis not present

## 2015-01-10 DIAGNOSIS — I10 Essential (primary) hypertension: Secondary | ICD-10-CM | POA: Diagnosis not present

## 2015-01-10 DIAGNOSIS — M5126 Other intervertebral disc displacement, lumbar region: Secondary | ICD-10-CM | POA: Diagnosis not present

## 2015-01-10 DIAGNOSIS — Z950 Presence of cardiac pacemaker: Secondary | ICD-10-CM | POA: Diagnosis not present

## 2015-01-10 DIAGNOSIS — I2510 Atherosclerotic heart disease of native coronary artery without angina pectoris: Secondary | ICD-10-CM | POA: Diagnosis not present

## 2015-01-10 DIAGNOSIS — F419 Anxiety disorder, unspecified: Secondary | ICD-10-CM | POA: Diagnosis not present

## 2015-01-10 DIAGNOSIS — M545 Low back pain: Secondary | ICD-10-CM | POA: Diagnosis not present

## 2015-01-10 DIAGNOSIS — Z720 Tobacco use: Secondary | ICD-10-CM | POA: Diagnosis not present

## 2015-01-10 DIAGNOSIS — E1151 Type 2 diabetes mellitus with diabetic peripheral angiopathy without gangrene: Secondary | ICD-10-CM | POA: Diagnosis not present

## 2015-01-10 DIAGNOSIS — Z8673 Personal history of transient ischemic attack (TIA), and cerebral infarction without residual deficits: Secondary | ICD-10-CM | POA: Diagnosis not present

## 2015-01-13 ENCOUNTER — Emergency Department (HOSPITAL_COMMUNITY): Payer: Medicare Other

## 2015-01-13 ENCOUNTER — Inpatient Hospital Stay (HOSPITAL_COMMUNITY)
Admission: EM | Admit: 2015-01-13 | Discharge: 2015-01-16 | DRG: 682 | Disposition: A | Discharge status: Home Health | Payer: Medicare Other | Attending: Internal Medicine | Admitting: Internal Medicine

## 2015-01-13 ENCOUNTER — Encounter (HOSPITAL_COMMUNITY): Payer: Self-pay | Admitting: *Deleted

## 2015-01-13 DIAGNOSIS — R1011 Right upper quadrant pain: Secondary | ICD-10-CM | POA: Diagnosis not present

## 2015-01-13 DIAGNOSIS — E875 Hyperkalemia: Secondary | ICD-10-CM | POA: Diagnosis present

## 2015-01-13 DIAGNOSIS — Z7902 Long term (current) use of antithrombotics/antiplatelets: Secondary | ICD-10-CM

## 2015-01-13 DIAGNOSIS — G8929 Other chronic pain: Secondary | ICD-10-CM | POA: Diagnosis present

## 2015-01-13 DIAGNOSIS — M545 Low back pain: Secondary | ICD-10-CM | POA: Diagnosis not present

## 2015-01-13 DIAGNOSIS — Z681 Body mass index (BMI) 19 or less, adult: Secondary | ICD-10-CM | POA: Diagnosis not present

## 2015-01-13 DIAGNOSIS — E43 Unspecified severe protein-calorie malnutrition: Secondary | ICD-10-CM | POA: Diagnosis not present

## 2015-01-13 DIAGNOSIS — I5031 Acute diastolic (congestive) heart failure: Secondary | ICD-10-CM | POA: Diagnosis not present

## 2015-01-13 DIAGNOSIS — H268 Other specified cataract: Secondary | ICD-10-CM | POA: Diagnosis not present

## 2015-01-13 DIAGNOSIS — R112 Nausea with vomiting, unspecified: Secondary | ICD-10-CM | POA: Diagnosis not present

## 2015-01-13 DIAGNOSIS — E860 Dehydration: Secondary | ICD-10-CM | POA: Diagnosis present

## 2015-01-13 DIAGNOSIS — Z91010 Allergy to peanuts: Secondary | ICD-10-CM

## 2015-01-13 DIAGNOSIS — F419 Anxiety disorder, unspecified: Secondary | ICD-10-CM | POA: Diagnosis not present

## 2015-01-13 DIAGNOSIS — Z794 Long term (current) use of insulin: Secondary | ICD-10-CM

## 2015-01-13 DIAGNOSIS — E1122 Type 2 diabetes mellitus with diabetic chronic kidney disease: Secondary | ICD-10-CM | POA: Diagnosis present

## 2015-01-13 DIAGNOSIS — I739 Peripheral vascular disease, unspecified: Secondary | ICD-10-CM | POA: Diagnosis present

## 2015-01-13 DIAGNOSIS — Z79899 Other long term (current) drug therapy: Secondary | ICD-10-CM

## 2015-01-13 DIAGNOSIS — E1165 Type 2 diabetes mellitus with hyperglycemia: Secondary | ICD-10-CM | POA: Diagnosis not present

## 2015-01-13 DIAGNOSIS — I129 Hypertensive chronic kidney disease with stage 1 through stage 4 chronic kidney disease, or unspecified chronic kidney disease: Secondary | ICD-10-CM | POA: Diagnosis present

## 2015-01-13 DIAGNOSIS — K5641 Fecal impaction: Secondary | ICD-10-CM | POA: Diagnosis present

## 2015-01-13 DIAGNOSIS — K529 Noninfective gastroenteritis and colitis, unspecified: Secondary | ICD-10-CM | POA: Diagnosis present

## 2015-01-13 DIAGNOSIS — N390 Urinary tract infection, site not specified: Secondary | ICD-10-CM | POA: Diagnosis not present

## 2015-01-13 DIAGNOSIS — I2510 Atherosclerotic heart disease of native coronary artery without angina pectoris: Secondary | ICD-10-CM | POA: Diagnosis not present

## 2015-01-13 DIAGNOSIS — K2980 Duodenitis without bleeding: Secondary | ICD-10-CM | POA: Diagnosis present

## 2015-01-13 DIAGNOSIS — Z950 Presence of cardiac pacemaker: Secondary | ICD-10-CM

## 2015-01-13 DIAGNOSIS — F17210 Nicotine dependence, cigarettes, uncomplicated: Secondary | ICD-10-CM | POA: Diagnosis present

## 2015-01-13 DIAGNOSIS — N179 Acute kidney failure, unspecified: Secondary | ICD-10-CM | POA: Diagnosis not present

## 2015-01-13 DIAGNOSIS — E119 Type 2 diabetes mellitus without complications: Secondary | ICD-10-CM | POA: Diagnosis present

## 2015-01-13 DIAGNOSIS — E1151 Type 2 diabetes mellitus with diabetic peripheral angiopathy without gangrene: Secondary | ICD-10-CM | POA: Diagnosis present

## 2015-01-13 DIAGNOSIS — N183 Chronic kidney disease, stage 3 (moderate): Secondary | ICD-10-CM | POA: Diagnosis not present

## 2015-01-13 DIAGNOSIS — Z888 Allergy status to other drugs, medicaments and biological substances status: Secondary | ICD-10-CM

## 2015-01-13 DIAGNOSIS — Z8673 Personal history of transient ischemic attack (TIA), and cerebral infarction without residual deficits: Secondary | ICD-10-CM

## 2015-01-13 DIAGNOSIS — R1010 Upper abdominal pain, unspecified: Secondary | ICD-10-CM

## 2015-01-13 DIAGNOSIS — I10 Essential (primary) hypertension: Secondary | ICD-10-CM | POA: Diagnosis present

## 2015-01-13 DIAGNOSIS — R0781 Pleurodynia: Secondary | ICD-10-CM | POA: Diagnosis not present

## 2015-01-13 DIAGNOSIS — Z7982 Long term (current) use of aspirin: Secondary | ICD-10-CM

## 2015-01-13 DIAGNOSIS — IMO0002 Reserved for concepts with insufficient information to code with codable children: Secondary | ICD-10-CM

## 2015-01-13 LAB — URINE MICROSCOPIC-ADD ON

## 2015-01-13 LAB — URINALYSIS, ROUTINE W REFLEX MICROSCOPIC
BILIRUBIN URINE: NEGATIVE
GLUCOSE, UA: NEGATIVE mg/dL
HGB URINE DIPSTICK: NEGATIVE
KETONES UR: NEGATIVE mg/dL
NITRITE: NEGATIVE
PH: 5 (ref 5.0–8.0)
Protein, ur: 30 mg/dL — AB
Specific Gravity, Urine: 1.013 (ref 1.005–1.030)
Urobilinogen, UA: 0.2 mg/dL (ref 0.0–1.0)

## 2015-01-13 LAB — COMPREHENSIVE METABOLIC PANEL
ALBUMIN: 3.9 g/dL (ref 3.5–5.0)
ALT: 75 U/L — AB (ref 14–54)
AST: 68 U/L — AB (ref 15–41)
Alkaline Phosphatase: 67 U/L (ref 38–126)
Anion gap: 8 (ref 5–15)
BILIRUBIN TOTAL: 0.3 mg/dL (ref 0.3–1.2)
BUN: 74 mg/dL — AB (ref 6–20)
CHLORIDE: 107 mmol/L (ref 101–111)
CO2: 17 mmol/L — ABNORMAL LOW (ref 22–32)
CREATININE: 1.92 mg/dL — AB (ref 0.44–1.00)
Calcium: 8.9 mg/dL (ref 8.9–10.3)
GFR calc Af Amer: 27 mL/min — ABNORMAL LOW (ref >60)
GFR, EST NON AFRICAN AMERICAN: 24 mL/min — AB (ref >60)
GLUCOSE: 211 mg/dL — AB (ref 65–99)
Potassium: 5.7 mmol/L — ABNORMAL HIGH (ref 3.5–5.1)
Sodium: 132 mmol/L — ABNORMAL LOW (ref 135–145)
Total Protein: 7.1 g/dL (ref 6.5–8.1)

## 2015-01-13 LAB — LIPASE, BLOOD: LIPASE: 13 U/L (ref 11–51)

## 2015-01-13 LAB — CBC
HCT: 42.1 % (ref 36.0–46.0)
Hemoglobin: 14.2 g/dL (ref 12.0–15.0)
MCH: 34.1 pg — ABNORMAL HIGH (ref 26.0–34.0)
MCHC: 33.7 g/dL (ref 30.0–36.0)
MCV: 101.2 fL — AB (ref 78.0–100.0)
PLATELETS: 178 10*3/uL (ref 150–400)
RBC: 4.16 MIL/uL (ref 3.87–5.11)
RDW: 13.6 % (ref 11.5–15.5)
WBC: 3 10*3/uL — AB (ref 4.0–10.5)

## 2015-01-13 MED ORDER — SODIUM CHLORIDE 0.9 % IV BOLUS (SEPSIS)
500.0000 mL | Freq: Once | INTRAVENOUS | Status: AC
  Administered 2015-01-13: 500 mL via INTRAVENOUS

## 2015-01-13 MED ORDER — MORPHINE SULFATE (PF) 2 MG/ML IV SOLN: 2.0000 mg | Freq: Once | INTRAVENOUS | Status: DC

## 2015-01-13 MED ORDER — IOHEXOL 300 MG/ML  SOLN
25.0000 mL | INTRAMUSCULAR | Status: AC
  Administered 2015-01-13: 25 mL via ORAL

## 2015-01-13 MED ORDER — ONDANSETRON HCL 4 MG/2ML IJ SOLN
4.0000 mg | Freq: Once | INTRAMUSCULAR | Status: AC
  Administered 2015-01-13: 4 mg via INTRAVENOUS
  Filled 2015-01-13: qty 2

## 2015-01-13 MED ORDER — DEXTROSE 5 % IV SOLN
1.0000 g | Freq: Once | INTRAVENOUS | Status: AC
  Administered 2015-01-13: 1 g via INTRAVENOUS
  Filled 2015-01-13: qty 10

## 2015-01-13 MED ORDER — MORPHINE SULFATE (PF) 2 MG/ML IV SOLN
2.0000 mg | Freq: Once | INTRAVENOUS | Status: AC
  Administered 2015-01-13: 2 mg via INTRAVENOUS
  Filled 2015-01-13: qty 1

## 2015-01-13 NOTE — ED Notes (Signed)
Pt arrived by [facility] for RUQ pain x 3-4 days. Reports n/v x 1. Pain increases with movement and reports generalized fatigue.

## 2015-01-13 NOTE — ED Provider Notes (Signed)
CSN: [id number]     Arrival date & time 01/13/15  1743 History   First MD Initiated Contact with Patient 01/13/15 2249     Chief Complaint  Patient presents with  . Abdominal Pain     (Consider location/radiation/quality/duration/timing/severity/associated sxs/prior Treatment) HPI 81 year old female presents with right upper quadrant abdominal pain for the last 3 days. Complained about it to family today for the first time and EMS was called. Vomited once in triage but otherwise has not complained of nausea or vomiting. States she has had no energy and poor appetite for the past 2 weeks or more. Granddaughter the bedside states she's ashy been eating okay until today when she has not even drank anything. No fevers. Denies pain worsening with eating.   Past Medical History  Diagnosis Date  . Hypertension   . Coronary artery disease   . Renal disorder   . Herniated lumbar intervertebral disc   . Cataract     "both eyes; blood pressure's always too high to have them fixed" (09/11/2014)  . Renovascular hypertension      s/p left renal artery stent 12/2007.  S/P balloon angioplasty on 02/16/10 for ISR, BP was  controlled well since then.     . Claudication in peripheral vascular disease ([place])     02/16/10: Left CIA 9.0x28 Omnilink and REIA 8.0x40 seff expanding Zilver.   right subclavian artery stent 03/18/2008,  . Peripheral vascular disease ([place])   . Anxiety   . Pacemaker   . Pneumonia X 2  . IDDM (insulin dependent diabetes mellitus) ([place])   . Migraine     "used to have terrible migraines; stopped in the 1990's"  . Stroke [facility]) 1999    Stroke and TIA in 1999 with right sided weakness, now with residual right arm weakness (09/11/2014)  . Arthritis     "everywhere"  . Chronic lower back pain    Past Surgical History  Procedure Laterality Date  . Appendectomy    . Ureteral stent placement    . Carotid endarterectomy Left 1991     Stroke and TIA in 1999 with right sided weakness, now  with residual right arm weakness  . Lumbar laminectomy      'herniated disc"  . Back surgery    . Colonoscopy  07/30/2011    Procedure: COLONOSCOPY;  Surgeon: Inda Castle, MD;  Location: [place];  Service: Endoscopy;  Laterality: N/A;  gi bleed  . Esophagogastroduodenoscopy  07/30/2011    Procedure: ESOPHAGOGASTRODUODENOSCOPY (EGD);  Surgeon: Inda Castle, MD;  Location: [place];  Service: Endoscopy;  Laterality: N/A;  . Permanent pacemaker insertion Left 06/28/2011    Procedure: PERMANENT PACEMAKER INSERTION;  Surgeon: Deboraha Sprang, MD;  Location: [facility] CATH LAB;  Service: Cardiovascular;  Laterality: Left;  . Abdominal angiogram N/A 07/06/2011    Procedure: ABDOMINAL ANGIOGRAM;  Surgeon: Laverda Page, MD;  Location: [facility] CATH LAB;  Service: Cardiovascular;  Laterality: N/A;  . Renal angiogram N/A 07/06/2011    Procedure: RENAL ANGIOGRAM;  Surgeon: Laverda Page, MD;  Location: [facility] CATH LAB;  Service: Cardiovascular;  Laterality: N/A;  . Lower extremity angiogram Right 05/29/2012    Procedure: LOWER EXTREMITY ANGIOGRAM;  Surgeon: Laverda Page, MD;  Location: [facility] CATH LAB;  Service: Cardiovascular;  Laterality: Right;  . Tonsillectomy    . Insert / replace / remove pacemaker    . Excisional hemorrhoidectomy    . Carpal tunnel release Left   . Vaginal hysterectomy    .  Peripheral vascular catheterization N/A 11/05/2014    Procedure: Abdominal Aortogram;  Surgeon: Serafina Mitchell, MD;  Location: [place] CV LAB;  Service: Cardiovascular;  Laterality: N/A;   Family History  Problem Relation Age of Onset  . Cancer Father   . Heart attack Mother    Social History  Substance Use Topics  . Smoking status: Current Every Day Smoker -- 0.00 packs/day for 70 years    Types: Cigarettes  . Smokeless tobacco: Never Used     Comment: 09/11/2014 "I used to smoke very heavy; down to 1 cigarette/day"  . Alcohol Use: Yes     Comment: "stopped drinking back in the 1970's"   OB  History    No data available     Review of Systems  Constitutional: Positive for fatigue. Negative for fever.  Gastrointestinal: Positive for vomiting and abdominal pain.  Genitourinary: Negative for dysuria.  Neurological: Positive for weakness.  All other systems reviewed and are negative.     Allergies  Peanuts; Norvasc; and Peanut-containing drug products  Home Medications   Prior to Admission medications   Medication Sig Start Date End Date Taking? Authorizing Provider  aspirin EC 81 MG tablet Take 81 mg by mouth daily.   Yes Historical Provider, MD  benazepril (LOTENSIN) 40 MG tablet Take 40 mg by mouth daily.   Yes Historical Provider, MD  clopidogrel (PLAVIX) 75 MG tablet Take 75 mg by mouth daily.   Yes Historical Provider, MD  diazepam (VALIUM) 5 MG tablet Take 1 tablet (5 mg total) by mouth 2 (two) times daily. 11/07/14  Yes Velvet Bathe, MD  furosemide (LASIX) 20 MG tablet Take 1 tablet (20 mg total) by mouth daily. 11/07/14  Yes Velvet Bathe, MD  hydrALAZINE (APRESOLINE) 100 MG tablet Take 1 tablet (100 mg total) by mouth 3 (three) times daily. Patient taking differently: Take 100 mg by mouth 4 (four) times daily.  09/16/14  Yes Charlynne Cousins, MD  isosorbide mononitrate (IMDUR) 120 MG 24 hr tablet Take 1 tablet (120 mg total) by mouth daily. 12/12/12  Yes Annita Brod, MD  linagliptin (TRADJENTA) 5 MG TABS tablet Take 1 tablet (5 mg total) by mouth daily. 12/01/14  Yes Lauree Chandler, NP  lisinopril (ZESTRIL) 10 MG tablet Take 1 tablet (10 mg total) by mouth daily. Patient taking differently: Take 20 mg by mouth daily.  09/19/14  Yes Charlynne Cousins, MD  metoprolol tartrate (LOPRESSOR) 25 MG tablet Take 25 mg by mouth 2 (two) times daily.   Yes Historical Provider, MD  NOVOLIN 70/30 (70-30) 100 UNIT/ML injection Inject 8 Units into the skin daily with supper.  11/10/14  Yes Historical Provider, MD  Omega-3 Fatty Acids (FISH OIL PEARLS) 300 MG CAPS Take 1 tablet  by mouth 2 (two) times daily.   Yes Historical Provider, MD  potassium chloride (K-DUR) 10 MEQ tablet Take 1 tablet (10 mEq total) by mouth 2 (two) times daily. 12/06/14  Yes Jola Schmidt, MD  acetaminophen-codeine (TYLENOL #4) 300-60 MG tablet Take 1 tablet by mouth every 6 (six) hours as needed for moderate pain. 12/06/14   Jola Schmidt, MD  metoprolol tartrate 75 MG TABS Take 75 mg by mouth 2 (two) times daily. 11/07/14   Velvet Bathe, MD  potassium chloride SA (K-DUR,KLOR-CON) 10 MEQ tablet Take 1 tablet (10 mEq total) by mouth daily. 08/10/13   Ripudeep Krystal Eaton, MD  tamsulosin (FLOMAX) 0.4 MG CAPS capsule Take 1 capsule (0.4 mg total) by mouth daily  after supper. 11/07/14   Velvet Bathe, MD   BP 166/56 mmHg  Pulse 60  Temp(Src) 98.6 F (37 C) (Oral)  Resp 18  SpO2 98% Physical Exam  Constitutional: She is oriented to person, place, and time. She appears well-developed and well-nourished.  HENT:  Head: Normocephalic and atraumatic.  Right Ear: External ear normal.  Left Ear: External ear normal.  Nose: Nose normal.  Eyes: Right eye exhibits no discharge. Left eye exhibits no discharge.  Neck: Neck supple.  Cardiovascular: Normal rate, regular rhythm and normal heart sounds.   Pulmonary/Chest: Effort normal and breath sounds normal.  Abdominal: Soft. She exhibits no distension. There is tenderness in the right upper quadrant. There is negative Murphy's sign.  Neurological: She is alert and oriented to person, place, and time.  Skin: Skin is warm and dry.  Nursing note and vitals reviewed.   ED Course  Procedures (including critical care time) Labs Review Labs Reviewed  COMPREHENSIVE METABOLIC PANEL - Abnormal; Notable for the following:    Sodium 132 (*)    Potassium 5.7 (*)    CO2 17 (*)    Glucose, Bld 211 (*)    BUN 74 (*)    Creatinine, Ser 1.92 (*)    AST 68 (*)    ALT 75 (*)    GFR calc non Af Amer 24 (*)    GFR calc Af Amer 27 (*)    All other components within normal  limits  CBC - Abnormal; Notable for the following:    WBC 3.0 (*)    MCV 101.2 (*)    MCH 34.1 (*)    All other components within normal limits  URINALYSIS, ROUTINE W REFLEX MICROSCOPIC (NOT AT [facility]) - Abnormal; Notable for the following:    APPearance CLOUDY (*)    Protein, ur 30 (*)    Leukocytes, UA MODERATE (*)    All other components within normal limits  URINE MICROSCOPIC-ADD ON - Abnormal; Notable for the following:    Squamous Epithelial / LPF FEW (*)    Bacteria, UA MANY (*)    All other components within normal limits  LIPASE, BLOOD    Imaging Review Dg Chest 2 View  01/13/2015  CLINICAL DATA:  Right lower rib pain for 1 day with no known injury EXAM: CHEST  2 VIEW COMPARISON:  09/11/2014 FINDINGS: Heart size upper normal. Two lead cardiac pacer again noted. Vascular pattern normal. No consolidation effusion or pneumothorax. Lungs are clear allowing for patient rotation exaggerating conspicuity of right hilum. Right superior mediastinal vascular stent again noted. Bony thorax grossly intact. Moderate to severe right shoulder arthropathy. Bilateral carotid artery calcification in the neck. Aortic calcification again noted. IMPRESSION: No active cardiopulmonary disease. Electronically Signed   By: Skipper Cliche M.D.   On: 01/13/2015 21:49   I have personally reviewed and evaluated these images and lab results as part of my medical decision-making.   EKG Interpretation   Date/Time:  Monday January 13 2015 23:26:44 EST Ventricular Rate:  60 PR Interval:  182 QRS Duration: 94 QT Interval:  427 QTC Calculation: 427 R Axis:   20 Text Interpretation:  Sinus rhythm Probable left atrial enlargement LVH  with secondary repolarization abnormality Anterior ST elevation, probably  due to LVH No significant change since Aug 2016 Confirmed by Regenia Skeeter  MD,  [place] [phone]) on 01/13/2015 11:32:01 PM      MDM   Final diagnoses:  Upper abdominal pain  Acute kidney injury ([place])  Patient's labs show acute kidney injury with mild hyperkalemia of 5.7. EKG shows chronic changes, no significant worsening now. She has right upper quadrant pain but no gallbladder. Plan to get a CT scan and she will need admission for further care of her acute kidney injury. Care to Dr. Christy Gentles with CT scan pending    Sherwood Gambler, MD 01/14/15 [phone]

## 2015-01-14 ENCOUNTER — Emergency Department (HOSPITAL_COMMUNITY): Payer: Medicare Other

## 2015-01-14 ENCOUNTER — Encounter (HOSPITAL_COMMUNITY): Payer: Self-pay | Admitting: Radiology

## 2015-01-14 DIAGNOSIS — I10 Essential (primary) hypertension: Secondary | ICD-10-CM

## 2015-01-14 DIAGNOSIS — R1010 Upper abdominal pain, unspecified: Secondary | ICD-10-CM | POA: Diagnosis not present

## 2015-01-14 DIAGNOSIS — R112 Nausea with vomiting, unspecified: Secondary | ICD-10-CM

## 2015-01-14 DIAGNOSIS — N179 Acute kidney failure, unspecified: Secondary | ICD-10-CM | POA: Diagnosis present

## 2015-01-14 DIAGNOSIS — I5031 Acute diastolic (congestive) heart failure: Secondary | ICD-10-CM | POA: Diagnosis not present

## 2015-01-14 DIAGNOSIS — E860 Dehydration: Secondary | ICD-10-CM | POA: Diagnosis not present

## 2015-01-14 DIAGNOSIS — E875 Hyperkalemia: Secondary | ICD-10-CM

## 2015-01-14 DIAGNOSIS — G8929 Other chronic pain: Secondary | ICD-10-CM

## 2015-01-14 LAB — COMPREHENSIVE METABOLIC PANEL
ALBUMIN: 3.8 g/dL (ref 3.5–5.0)
ALK PHOS: 60 U/L (ref 38–126)
ALT: 69 U/L — ABNORMAL HIGH (ref 14–54)
ANION GAP: 9 (ref 5–15)
AST: 62 U/L — ABNORMAL HIGH (ref 15–41)
BILIRUBIN TOTAL: 0.3 mg/dL (ref 0.3–1.2)
BUN: 73 mg/dL — ABNORMAL HIGH (ref 6–20)
CALCIUM: 8.7 mg/dL — AB (ref 8.9–10.3)
CO2: 18 mmol/L — ABNORMAL LOW (ref 22–32)
Chloride: 103 mmol/L (ref 101–111)
Creatinine, Ser: 1.93 mg/dL — ABNORMAL HIGH (ref 0.44–1.00)
GFR, EST AFRICAN AMERICAN: 27 mL/min — AB (ref >60)
GFR, EST NON AFRICAN AMERICAN: 24 mL/min — AB (ref >60)
Glucose, Bld: 162 mg/dL — ABNORMAL HIGH (ref 65–99)
POTASSIUM: 6.5 mmol/L — AB (ref 3.5–5.1)
Sodium: 130 mmol/L — ABNORMAL LOW (ref 135–145)
TOTAL PROTEIN: 6.9 g/dL (ref 6.5–8.1)

## 2015-01-14 LAB — GLUCOSE, CAPILLARY
GLUCOSE-CAPILLARY: 214 mg/dL — AB (ref 65–99)
GLUCOSE-CAPILLARY: 163 mg/dL — AB (ref 65–99)
GLUCOSE-CAPILLARY: 134 mg/dL — AB (ref 65–99)
Glucose-Capillary: 199 mg/dL — ABNORMAL HIGH (ref 65–99)
Glucose-Capillary: 158 mg/dL — ABNORMAL HIGH (ref 65–99)

## 2015-01-14 LAB — POTASSIUM: POTASSIUM: 6.4 mmol/L — AB (ref 3.5–5.1)

## 2015-01-14 MED ORDER — DEXTROSE 50 % IV SOLN
INTRAVENOUS | Status: AC
  Filled 2015-01-14: qty 50

## 2015-01-14 MED ORDER — ONDANSETRON HCL 4 MG PO TABS: 4.0000 mg | ORAL_TABLET | Freq: Four times a day (QID) | ORAL | Status: DC | PRN

## 2015-01-14 MED ORDER — SODIUM CHLORIDE 0.9 % IV SOLN
1.0000 g | Freq: Once | INTRAVENOUS | Status: AC
  Administered 2015-01-14: 1 g via INTRAVENOUS
  Filled 2015-01-14: qty 10

## 2015-01-14 MED ORDER — SODIUM CHLORIDE 0.9 % IJ SOLN
3.0000 mL | Freq: Two times a day (BID) | INTRAMUSCULAR | Status: DC
  Administered 2015-01-16: 3 mL via INTRAVENOUS

## 2015-01-14 MED ORDER — METRONIDAZOLE IN NACL 5-0.79 MG/ML-% IV SOLN
500.0000 mg | Freq: Three times a day (TID) | INTRAVENOUS | Status: DC
  Administered 2015-01-14 – 2015-01-15 (×4): 500 mg via INTRAVENOUS
  Filled 2015-01-14 (×4): qty 100

## 2015-01-14 MED ORDER — INSULIN ASPART PROT & ASPART (70-30 MIX) 100 UNIT/ML ~~LOC~~ SUSP
8.0000 [IU] | Freq: Every day | SUBCUTANEOUS | Status: DC
  Administered 2015-01-14 – 2015-01-15 (×2): 8 [IU] via SUBCUTANEOUS
  Filled 2015-01-14: qty 10

## 2015-01-14 MED ORDER — SODIUM POLYSTYRENE SULFONATE 15 GM/60ML PO SUSP: 30.0000 g | Freq: Once | ORAL | Status: DC

## 2015-01-14 MED ORDER — DEXTROSE 50 % IV SOLN
25.0000 mL | Freq: Once | INTRAVENOUS | Status: AC
  Administered 2015-01-14: 25 mL via INTRAVENOUS

## 2015-01-14 MED ORDER — CLOPIDOGREL BISULFATE 75 MG PO TABS
75.0000 mg | ORAL_TABLET | Freq: Every day | ORAL | Status: DC
Start: 2015-01-14 — End: 2015-01-16
  Administered 2015-01-14 – 2015-01-16 (×3): 75 mg via ORAL
  Filled 2015-01-14 (×3): qty 1

## 2015-01-14 MED ORDER — ACETAMINOPHEN-CODEINE #4 300-60 MG PO TABS: 1.0000 | ORAL_TABLET | Freq: Four times a day (QID) | ORAL | Status: DC | PRN

## 2015-01-14 MED ORDER — GLUCERNA SHAKE PO LIQD
237.0000 mL | Freq: Two times a day (BID) | ORAL | Status: DC
  Administered 2015-01-14 – 2015-01-16 (×4): 237 mL via ORAL

## 2015-01-14 MED ORDER — HYDRALAZINE HCL 20 MG/ML IJ SOLN: 10.0000 mg | Freq: Four times a day (QID) | INTRAMUSCULAR | Status: DC | PRN

## 2015-01-14 MED ORDER — PANTOPRAZOLE SODIUM 40 MG PO TBEC
40.0000 mg | DELAYED_RELEASE_TABLET | Freq: Every day | ORAL | Status: DC
  Administered 2015-01-14 – 2015-01-16 (×3): 40 mg via ORAL
  Filled 2015-01-14 (×3): qty 1

## 2015-01-14 MED ORDER — INSULIN ASPART 100 UNIT/ML ~~LOC~~ SOLN
10.0000 [IU] | Freq: Once | SUBCUTANEOUS | Status: AC
  Administered 2015-01-14: 10 [IU] via SUBCUTANEOUS

## 2015-01-14 MED ORDER — POLYETHYLENE GLYCOL 3350 17 G PO PACK
17.0000 g | PACK | Freq: Every day | ORAL | Status: DC
  Administered 2015-01-14 – 2015-01-16 (×3): 17 g via ORAL
  Filled 2015-01-14 (×3): qty 1

## 2015-01-14 MED ORDER — OMEGA-3-ACID ETHYL ESTERS 1 G PO CAPS
1.0000 g | ORAL_CAPSULE | Freq: Two times a day (BID) | ORAL | Status: DC
  Administered 2015-01-14 – 2015-01-16 (×4): 1 g via ORAL
  Filled 2015-01-14 (×5): qty 1

## 2015-01-14 MED ORDER — INSULIN ASPART 100 UNIT/ML ~~LOC~~ SOLN
0.0000 [IU] | Freq: Three times a day (TID) | SUBCUTANEOUS | Status: DC
  Administered 2015-01-14 (×3): 2 [IU] via SUBCUTANEOUS
  Administered 2015-01-15: 5 [IU] via SUBCUTANEOUS
  Administered 2015-01-15: 9 [IU] via SUBCUTANEOUS

## 2015-01-14 MED ORDER — ACETAMINOPHEN-CODEINE #3 300-30 MG PO TABS
1.0000 | ORAL_TABLET | Freq: Four times a day (QID) | ORAL | Status: DC | PRN
  Administered 2015-01-14 – 2015-01-15 (×2): 1 via ORAL
  Filled 2015-01-14 (×2): qty 1

## 2015-01-14 MED ORDER — ONDANSETRON HCL 4 MG/2ML IJ SOLN: 4.0000 mg | Freq: Four times a day (QID) | INTRAMUSCULAR | Status: DC | PRN

## 2015-01-14 MED ORDER — DIAZEPAM 5 MG PO TABS
5.0000 mg | ORAL_TABLET | Freq: Two times a day (BID) | ORAL | Status: DC
  Administered 2015-01-14 – 2015-01-16 (×5): 5 mg via ORAL
  Filled 2015-01-14 (×5): qty 1

## 2015-01-14 MED ORDER — SODIUM POLYSTYRENE SULFONATE 15 GM/60ML PO SUSP
30.0000 g | Freq: Once | ORAL | Status: AC
  Administered 2015-01-14: 30 g via ORAL
  Filled 2015-01-14: qty 120

## 2015-01-14 MED ORDER — ISOSORBIDE MONONITRATE ER 60 MG PO TB24
120.0000 mg | ORAL_TABLET | Freq: Every day | ORAL | Status: DC
  Administered 2015-01-14 – 2015-01-16 (×3): 120 mg via ORAL
  Filled 2015-01-14 (×4): qty 2

## 2015-01-14 MED ORDER — FISH OIL PEARLS 300 MG PO CAPS: 1.0000 | ORAL_CAPSULE | Freq: Two times a day (BID) | ORAL | Status: DC

## 2015-01-14 MED ORDER — TAMSULOSIN HCL 0.4 MG PO CAPS
0.4000 mg | ORAL_CAPSULE | Freq: Every day | ORAL | Status: DC
  Administered 2015-01-14 – 2015-01-15 (×2): 0.4 mg via ORAL
  Filled 2015-01-14 (×2): qty 1

## 2015-01-14 MED ORDER — METOPROLOL TARTRATE 25 MG PO TABS
25.0000 mg | ORAL_TABLET | Freq: Two times a day (BID) | ORAL | Status: DC
  Administered 2015-01-14 – 2015-01-16 (×5): 25 mg via ORAL
  Filled 2015-01-14 (×5): qty 1

## 2015-01-14 MED ORDER — CIPROFLOXACIN IN D5W 200 MG/100ML IV SOLN
200.0000 mg | Freq: Two times a day (BID) | INTRAVENOUS | Status: DC
  Administered 2015-01-14 – 2015-01-15 (×3): 200 mg via INTRAVENOUS
  Filled 2015-01-14 (×4): qty 100

## 2015-01-14 MED ORDER — HEPARIN SODIUM (PORCINE) 5000 UNIT/ML IJ SOLN
5000.0000 [IU] | Freq: Three times a day (TID) | INTRAMUSCULAR | Status: DC
  Administered 2015-01-14 – 2015-01-16 (×7): 5000 [IU] via SUBCUTANEOUS
  Filled 2015-01-14 (×2): qty 1

## 2015-01-14 MED ORDER — ASPIRIN EC 81 MG PO TBEC
81.0000 mg | DELAYED_RELEASE_TABLET | Freq: Every day | ORAL | Status: DC
  Administered 2015-01-14 – 2015-01-16 (×3): 81 mg via ORAL
  Filled 2015-01-14 (×4): qty 1

## 2015-01-14 MED ORDER — HYDRALAZINE HCL 50 MG PO TABS
100.0000 mg | ORAL_TABLET | Freq: Four times a day (QID) | ORAL | Status: DC
  Administered 2015-01-14 – 2015-01-16 (×9): 100 mg via ORAL
  Filled 2015-01-14 (×14): qty 2

## 2015-01-14 MED ORDER — SODIUM CHLORIDE 0.9 % IV SOLN
INTRAVENOUS | Status: DC
  Administered 2015-01-14 – 2015-01-15 (×3): via INTRAVENOUS

## 2015-01-14 MED ORDER — DEXTROSE 50 % IV SOLN
25.0000 mL | Freq: Once | INTRAVENOUS | Status: AC
  Administered 2015-01-14: 25 mL via INTRAVENOUS
  Filled 2015-01-14: qty 50

## 2015-01-14 NOTE — H&P (Addendum)
[facility] History and Physical  Carmen [name] RSW:[id number] DOB: [birth year] DOA: 01/13/2015  Referring physician: ED PCP: Maximino Greenland, MD   Chief Complaint: Right sided abdominal pain  HPI:  Patient is a 81 year old female with a past medical history significant for diabetes mellitus type 2, chronic kidney disease, hypertension; who presents for acute onset of right upper quadrant pain. Symptoms started 2-3 days ago and patient reports it as a dull constant pain. However, pain became so bad today that she alerted family members who called EMS around 4:45 PM. Patient reports that she's been having associated symptoms of nausea and decreased appetite. She had one episode of vomiting today in the emergency department. Holding her side on the right pelvis relieved symptoms.  Granddaughter is present in the room and adds additional history. She notes that there've been multiple medication changes in recent months including diazepam and an new diabetes medication. On admission into the emergency department her creatinine level was elevated at 1.92, potassium of 5.7, and there were signs of a UTI on urinalysis. Patient was given Rocephin.     Review of Systems  Constitutional: Positive for malaise/fatigue. Negative for fever and chills.  HENT: Negative for hearing loss.   Eyes: Negative for blurred vision and double vision.  Respiratory: Negative for cough and hemoptysis.   Cardiovascular: Negative for chest pain and palpitations.  Gastrointestinal: Positive for nausea, vomiting and abdominal pain.  Genitourinary: Positive for dysuria and flank pain.  Musculoskeletal: Negative for neck pain.  Skin: Negative for itching and rash.  Neurological: Negative for sensory change, focal weakness and headaches.  Endo/Heme/Allergies: Negative for polydipsia. Does not bruise/bleed easily.  Psychiatric/Behavioral: Negative for depression and suicidal ideas.       Past Medical History   Diagnosis Date  . Hypertension   . Coronary artery disease   . Renal disorder   . Herniated lumbar intervertebral disc   . Cataract     "both eyes; blood pressure's always too high to have them fixed" (09/11/2014)  . Renovascular hypertension      s/p left renal artery stent 12/2007.  S/P balloon angioplasty on 02/16/10 for ISR, BP was  controlled well since then.     . Claudication in peripheral vascular disease ([place])     02/16/10: Left CIA 9.0x28 Omnilink and REIA 8.0x40 seff expanding Zilver.   right subclavian artery stent 03/18/2008,  . Peripheral vascular disease ([place])   . Anxiety   . Pacemaker   . Pneumonia X 2  . IDDM (insulin dependent diabetes mellitus) ([place])   . Migraine     "used to have terrible migraines; stopped in the 1990's"  . Stroke [facility]) 1999    Stroke and TIA in 1999 with right sided weakness, now with residual right arm weakness (09/11/2014)  . Arthritis     "everywhere"  . Chronic lower back pain      Past Surgical History  Procedure Laterality Date  . Appendectomy    . Ureteral stent placement    . Carotid endarterectomy Left 1991     Stroke and TIA in 1999 with right sided weakness, now with residual right arm weakness  . Lumbar laminectomy      'herniated disc"  . Back surgery    . Colonoscopy  07/30/2011    Procedure: COLONOSCOPY;  Surgeon: Inda Castle, MD;  Location: [facility];  Service: Endoscopy;  Laterality: N/A;  gi bleed  . Esophagogastroduodenoscopy  07/30/2011    Procedure: ESOPHAGOGASTRODUODENOSCOPY (EGD);  Surgeon: Herbie Baltimore  Shaaron Adler, MD;  Location: [facility];  Service: Endoscopy;  Laterality: N/A;  . Permanent pacemaker insertion Left 06/28/2011    Procedure: PERMANENT PACEMAKER INSERTION;  Surgeon: Deboraha Sprang, MD;  Location: [facility] CATH LAB;  Service: Cardiovascular;  Laterality: Left;  . Abdominal angiogram N/A 07/06/2011    Procedure: ABDOMINAL ANGIOGRAM;  Surgeon: Laverda Page, MD;  Location: [facility] CATH LAB;  Service:  Cardiovascular;  Laterality: N/A;  . Renal angiogram N/A 07/06/2011    Procedure: RENAL ANGIOGRAM;  Surgeon: Laverda Page, MD;  Location: [facility] CATH LAB;  Service: Cardiovascular;  Laterality: N/A;  . Lower extremity angiogram Right 05/29/2012    Procedure: LOWER EXTREMITY ANGIOGRAM;  Surgeon: Laverda Page, MD;  Location: [facility] CATH LAB;  Service: Cardiovascular;  Laterality: Right;  . Tonsillectomy    . Insert / replace / remove pacemaker    . Excisional hemorrhoidectomy    . Carpal tunnel release Left   . Vaginal hysterectomy    . Peripheral vascular catheterization N/A 11/05/2014    Procedure: Abdominal Aortogram;  Surgeon: Serafina Mitchell, MD;  Location: [place] CV LAB;  Service: Cardiovascular;  Laterality: N/A;      Social History:  reports that she has been smoking Cigarettes.  She has been smoking about 0.00 packs per day for the past 70 years. She has never used smokeless tobacco. She reports that she drinks alcohol. She reports that she does not use illicit drugs.  where does patient live--home Can patient participate in ADLs? yes  Allergies  Allergen Reactions  . Peanuts [Peanut Oil] Swelling  . Norvasc [Amlodipine Besylate] Swelling  . Peanut-Containing Drug Products Other (See Comments)    Cause my stomach to hurt    Family History  Problem Relation Age of Onset  . Cancer Father   . Heart attack Mother         Prior to Admission medications   Medication Sig Start Date End Date Taking? Authorizing Provider  aspirin EC 81 MG tablet Take 81 mg by mouth daily.   Yes Historical Provider, MD  benazepril (LOTENSIN) 40 MG tablet Take 40 mg by mouth daily.   Yes Historical Provider, MD  clopidogrel (PLAVIX) 75 MG tablet Take 75 mg by mouth daily.   Yes Historical Provider, MD  diazepam (VALIUM) 5 MG tablet Take 1 tablet (5 mg total) by mouth 2 (two) times daily. 11/07/14  Yes Velvet Bathe, MD  furosemide (LASIX) 20 MG tablet Take 1 tablet (20 mg total) by mouth  daily. 11/07/14  Yes Velvet Bathe, MD  hydrALAZINE (APRESOLINE) 100 MG tablet Take 1 tablet (100 mg total) by mouth 3 (three) times daily. Patient taking differently: Take 100 mg by mouth 4 (four) times daily.  09/16/14  Yes Charlynne Cousins, MD  isosorbide mononitrate (IMDUR) 120 MG 24 hr tablet Take 1 tablet (120 mg total) by mouth daily. 12/12/12  Yes Annita Brod, MD  linagliptin (TRADJENTA) 5 MG TABS tablet Take 1 tablet (5 mg total) by mouth daily. 12/01/14  Yes Lauree Chandler, NP  lisinopril (ZESTRIL) 10 MG tablet Take 1 tablet (10 mg total) by mouth daily. Patient taking differently: Take 20 mg by mouth daily.  09/19/14  Yes Charlynne Cousins, MD  metoprolol tartrate (LOPRESSOR) 25 MG tablet Take 25 mg by mouth 2 (two) times daily.   Yes Historical Provider, MD  NOVOLIN 70/30 (70-30) 100 UNIT/ML injection Inject 8 Units into the skin daily with supper.  11/10/14  Yes Historical Provider,  MD  Omega-3 Fatty Acids (FISH OIL PEARLS) 300 MG CAPS Take 1 tablet by mouth 2 (two) times daily.   Yes Historical Provider, MD  potassium chloride (K-DUR) 10 MEQ tablet Take 1 tablet (10 mEq total) by mouth 2 (two) times daily. 12/06/14  Yes Jola Schmidt, MD  acetaminophen-codeine (TYLENOL #4) 300-60 MG tablet Take 1 tablet by mouth every 6 (six) hours as needed for moderate pain. 12/06/14   Jola Schmidt, MD  metoprolol tartrate 75 MG TABS Take 75 mg by mouth 2 (two) times daily. 11/07/14   Velvet Bathe, MD  potassium chloride SA (K-DUR,KLOR-CON) 10 MEQ tablet Take 1 tablet (10 mEq total) by mouth daily. 08/10/13   Ripudeep Krystal Eaton, MD  tamsulosin (FLOMAX) 0.4 MG CAPS capsule Take 1 capsule (0.4 mg total) by mouth daily after supper. 11/07/14   Velvet Bathe, MD     Physical Exam: Filed Vitals:   01/14/15 0230 01/14/15 0315 01/14/15 0408 01/14/15 0458  BP: 161/59 154/59 158/55 183/53  Pulse: 60 60 60 59  Temp:    98.9 F (37.2 C)  TempSrc:    Oral  Resp: 16  17 18   Weight:    43.727 kg (96 lb 6.4 oz)   SpO2: 99% 96% 97% 99%     Constitutional: Vital signs reviewed. Patient is a thin elderly female in no acute distress and cooperative with exam. Alert and oriented x3.  Head: Normocephalic and atraumatic  Ear: TM normal bilaterally  Mouth: no erythema or exudates, MMM  Eyes: PERRL, EOMI, conjunctivae normal, No scleral icterus.  Neck: Supple, Trachea midline normal ROM, No JVD, mass, thyromegaly, or carotid bruit present.  Cardiovascular: RRR, S1 normal, S2 normal, no MRG, pulses symmetric and intact bilaterally  Pulmonary/Chest: CTAB, no wheezes, rales, or rhonchi  Abdominal: Soft. Tender to palpation of the right upper quadrant and suprapubically. Non-distended, bowel sounds are normal, no masses, organomegaly, or guarding present.  NF:AOZH CVA tenderness Musculoskeletal: No joint deformities, erythema, or stiffness, ROM full and no nontender Ext: no edema and no cyanosis, pulses palpable bilaterally (DP and PT)  Hematology: no cervical, inginal, or axillary adenopathy.  Neurological: A&O x3, Strenght is normal and symmetric bilaterally, cranial nerve II-XII are grossly intact, no focal motor deficit, sensory intact to light touch bilaterally.  Skin: Warm, dry and intact. No rash, cyanosis, or clubbing. Patient has poor skin turgor  Psychiatric: Normal mood and affect. speech and behavior is normal. Judgment and thought content normal. Cognition and memory are normal.      Data Review   Micro Results No results found for this or any previous visit (from the past 240 hour(s)).  Radiology Reports Ct Abdomen Pelvis Wo Contrast  01/14/2015  CLINICAL DATA:  Right upper quadrant pain for 3 days. Nausea and vomiting. EXAM: CT ABDOMEN AND PELVIS WITHOUT CONTRAST TECHNIQUE: Multidetector CT imaging of the abdomen and pelvis was performed following the standard protocol without IV contrast. COMPARISON:  CT 10/28/2014 FINDINGS: Lower chest: The heart is enlarged. Pacemaker wires partially  included. Scattered atelectasis in both lower lobes. No pleural effusion. Liver: No focal lesion allowing for lack contrast. Hepatobiliary: Gallbladder physiologically distended. No evidence of biliary dilatation. Pancreas: Sequela of chronic pancreatitis with multifocal pancreatic calcifications. Pancreatic parenchymal atrophy. Spleen: Sequela of granulomatous disease with calcifications, normal in size. Adrenal glands: Not well seen, no evidence of mass. Kidneys: No hydronephrosis. Probable nonobstructing stone lower left kidney. Bilateral renal cysts, suboptimally defined without contrast. Stomach/Bowel: Stomach physiologically distended. Suspect distal duodenal and proximal  jejunal wall thickening. Moderate volume of stool throughout the colon without colonic wall thickening, a stool ball distends the rectum. Paucity of intra-abdominal fat limits bowel evaluation. Vascular/Lymphatic: No retroperitoneal adenopathy. Advanced atherosclerosis of the abdominal aorta and its branches. No aneurysm. Reproductive: Uterus surgically absent.  No adnexal mass. Bladder: Physiologically distended, no definite wall thickening. Other: No free air, free fluid, or intra-abdominal fluid collection. Musculoskeletal: There are no acute or suspicious osseous abnormalities. Postsurgical change at L5-S1. Compression deformities of L1, L2, and L4, stable. IMPRESSION: 1. Wall thickening involving the duodenum and proximal small bowel loops, suspect enteritis. No obstruction. 2. Moderate stool burden with stool ball distending the rectum, query constipation and fecal impaction. 3. Sequela of chronic pancreatitis. Electronically Signed   By: Jeb Levering M.D.   On: 01/14/2015 02:21   Dg Chest 2 View  01/13/2015  CLINICAL DATA:  Right lower rib pain for 1 day with no known injury EXAM: CHEST  2 VIEW COMPARISON:  09/11/2014 FINDINGS: Heart size upper normal. Two lead cardiac pacer again noted. Vascular pattern normal. No consolidation  effusion or pneumothorax. Lungs are clear allowing for patient rotation exaggerating conspicuity of right hilum. Right superior mediastinal vascular stent again noted. Bony thorax grossly intact. Moderate to severe right shoulder arthropathy. Bilateral carotid artery calcification in the neck. Aortic calcification again noted. IMPRESSION: No active cardiopulmonary disease. Electronically Signed   By: Skipper Cliche M.D.   On: 01/13/2015 21:49     CBC  Recent Labs Lab 01/13/15 1831  WBC 3.0*  HGB 14.2  HCT 42.1  PLT 178  MCV 101.2*  MCH 34.1*  MCHC 33.7  RDW 13.6    Chemistries   Recent Labs Lab 01/13/15 1831  NA 132*  K 5.7*  CL 107  CO2 17*  GLUCOSE 211*  BUN 74*  CREATININE 1.92*  CALCIUM 8.9  AST 68*  ALT 75*  ALKPHOS 67  BILITOT 0.3   ------------------------------------------------------------------------------------------------------------------ estimated creatinine clearance is 16.4 mL/min (by C-G formula based on Cr of 1.92). ------------------------------------------------------------------------------------------------------------------ No results for input(s): HGBA1C in the last 72 hours. ------------------------------------------------------------------------------------------------------------------ No results for input(s): CHOL, HDL, LDLCALC, TRIG, CHOLHDL, LDLDIRECT in the last 72 hours. ------------------------------------------------------------------------------------------------------------------ No results for input(s): TSH, T4TOTAL, T3FREE, THYROIDAB in the last 72 hours.  Invalid input(s): FREET3 ------------------------------------------------------------------------------------------------------------------ No results for input(s): VITAMINB12, FOLATE, FERRITIN, TIBC, IRON, RETICCTPCT in the last 72 hours.  Coagulation profile No results for input(s): INR, PROTIME in the last 168 hours.  No results for input(s): DDIMER in the last 72  hours.  Cardiac Enzymes No results for input(s): CKMB, TROPONINI, MYOGLOBIN in the last 168 hours.  Invalid input(s): CK ------------------------------------------------------------------------------------------------------------------ Invalid input(s): POCBNP   CBG: No results for input(s): GLUCAP in the last 168 hours.     EKG: Independently reviewed. Normal sinus rhythm with signs of LVH   Assessment/Plan Dehydration: Acute. suspect secondary to acute illness and patient's Lasix that she takes on a daily basis. -Hold  Lasix -Gentle fluid hydration   AKI (acute kidney injury) ([place]) acute baseline creatinine somewhere around 1.09 however acutely worsened to 1.92 day. Suspect prerenal in nature -Held lisinopril and benazepril due to acute kidney injury   -Gentle hydration with normal saline IV fluids -Rechecking BMP  Urinary tract infection: Positive UA with suprapubic tenderness -Check urine culture -Continue Rocephin until able to tailor down to a more specific antibiotic  RUQ abdominal pain: Acute over the last 2 days of question ideology at this time CT scan showing signs of possible enteritis  with no obstruction -abx as seen above.  Constipation: As seen on CT scan  - MiraLAX daily may consider a enema if infection not relieved    DM (diabetes mellitus), type 2, uncontrolled, periph vascular complic (HCC) -Held oral hypoglycemic agents -Sliding-scale insulin -Continue home 70/30 mix  HTN (hypertension) -Continue Lopressor  and hydralazine per home dose   Hyperkalemia potassium seen to be at 5.7 on admission no significant  -Patient placed on telemetry -Recheck BMP  Nausea and vomiting: Patient reports only one occurrence of vomiting. -As needed Zofran    Acute diastolic heart failure ([place] stable   Code Status:   full Family Communication: bedside Disposition Plan: admit   Total time spent 55 minutes.Greater than 50% of this time was spent in  counseling, explanation of diagnosis, planning of further management, and coordination of care  [facility] Pager [phone]  If 7PM-7AM, please contact night-coverage [email or web address] Password [email or web address] 01/14/2015, 5:52 AM

## 2015-01-14 NOTE — Progress Notes (Signed)
Initial Nutrition Assessment  DOCUMENTATION CODES:   Severe malnutrition in context of chronic illness, Underweight  INTERVENTION:  Provide Glucerna Shake po BID, each supplement provides 220 kcal and 10 grams of protein.  Encourage adequate PO intake.   NUTRITION DIAGNOSIS:   Malnutrition related to chronic illness as evidenced by severe depletion of body fat, severe depletion of muscle mass.  GOAL:   Patient will meet greater than or equal to 90% of their needs  MONITOR:   PO intake, Supplement acceptance, Weight trends, Labs, I & O's  REASON FOR ASSESSMENT:   Consult Assessment of nutrition requirement/status  ASSESSMENT:   81 year old female with history of diabetes mellitus type 2, chronic kidney disease, Coronary artery disease, hypertension presented with acute onset of right upper quadrant abdominal pain for the past 2-3 days, being dull with associated nausea and poor appetite and an episode of vomiting on the day of admission. CT of the abdomen and pelvis showing inflammation of the duodenum and proximal small bowel. Labs showed acute kidney injury. CT of the abdomen also showed impacted stool.  Pt reports having a good appetite currently and PTA with consumption of at least 3 meals a day. Current meal completion is 100%. Pt reports her weight fluctuates. Per Epic weight records, pt with a 13.5% weight loss in 2 months. Pt reports she does consume Ensure at home twice daily and is agreeable on receiving them while admitted. Noted CBGs have been elevated, RD to order to order Glucerna. Patient and family at bedside were educated to the importance of continuing protein supplementation as well as on budget friendly nutritional supplements.  Nutrition-Focused physical exam completed. Findings are severe fat depletion, severe muscle depletion, and no edema.   Labs: low sodium, CO2, calcium, and GFR. High BUN, creatinine, AST, ALT, potassium (6.5).  Diet Order:  Diet Heart  Room service appropriate?: Yes; Fluid consistency:: Thin  Skin:  Reviewed, no issues  Last BM:  PTA  Height:   Ht Readings from Last 1 Encounters:  11/05/14 5\' 4"  (1.626 m)    Weight:   Wt Readings from Last 1 Encounters:  01/14/15 96 lb 6.4 oz (43.727 kg)    Ideal Body Weight:  54.5 kg  BMI:  Body mass index is 16.54 kg/(m^2).  Estimated Nutritional Needs:   Kcal:  1450-1650  Protein:  65-75 grams  Fluid:  Per MD  EDUCATION NEEDS:   Education needs addressed  Corrin Parker, MS, RD, LDN Pager # [phone] After hours/ weekend pager # [phone]

## 2015-01-14 NOTE — Progress Notes (Signed)
PT Cancellation Note  Patient Details Name: CHASLYN EISEN MRN: [id number] DOB: [birth year]   Cancelled Treatment:    Reason Eval/Treat Not Completed: Patient declined, no reason specified Patient requests PT evaluation be held until tomorrow. States she has had an enema and is having very frequent loose stools.  Will follow-up for evaluation tomorrow.  Ellouise Newer 01/14/2015, 5:59 PM Camille Bal [place], [place]

## 2015-01-14 NOTE — ED Provider Notes (Signed)
CT negative for acute disease Pt requesting food Will admit D/w dr Fuller Plan Admit to tele Pt given IV fluids and rocephin in the ED   Ripley Fraise, MD 01/14/15 [id number]

## 2015-01-14 NOTE — Progress Notes (Signed)
[facility] PROGRESS NOTE  Carmen [name] JEH:[id number] DOB: 1934/01/31 DOA: 01/13/2015 PCP: Maximino Greenland, MD  Same day follow up.  Please refer to admission H&P early this morning for details, in brief, 81 year old female with history of diabetes mellitus type 2, chronic kidney disease, Coronary artery disease, hypertension presented with acute onset of right upper quadrant abdominal pain for the past 2-3 days, being dull with associated nausea and poor appetite and an episode of vomiting on the day of admission. CT of the abdomen and pelvis showing inflammation of the duodenum and proximal small bowel. Labs showed acute kidney injury with creatinine of 1.92 and hyperkalemia with potassium of 5.7 (was on potassium supplement at home). UA was also suggestive of UTI.) CT of the abdomen also showed impacted stool. This morning repeat blood work showed worsening hyperkalemia with potassium 6.5. EKG done showed peaked T waves in anterior leads (unchanged from admission) . Patient is symptomatic. Ordered 1 g Gluconate, D50 with Regular Insulin and Kayexalate.  Assessment/Plan: Acute kidney injury Secondary to dehydration with associated poor by mouth intake and vomiting secondary to underlying enteritis. Hold Lasix. She also on both ACEi and ARB which is  held. Monitor renal function with gentle hydration.   Hyperkalemia Patient on potassium supplements at home. Given calcium gluconate, D50 with insulin and Kayexalate. EKG with peaked T waves unchanged admission.  Acute enteritis/ duodenitis Symptoms improving. Will swtch abx to cipro and flagyl.  zofran prn for nausea and morphine for pain.   Constipation with stool impaction  ordered soap suds enema  Essential HTN  on multiple BP meds Continue lopressor and hydralazine  Diastolic CHF  stable  CAD / PVD and hx of stroke On ASA, plavix . Continue BB  Type  2 DM On tradjenta and novolin at home. Resumed and monitor on  SSI.  Protein calorie malnutrition Added supplements  DVT prophylaxis: sq heparin  Diet: diabetic/ heart healthy    Code Status: Full code Family Communication: Daughter at bedside  Disposition Plan: in next 1-2 days once symptoms improve. Possibly home with services. PT evaluation.   Consultants:  None  Procedures:  CT abdomen and pelvis  Antibiotics:  IV Rocephin 1  IV Cipro and Flagyl 11/8--  HPI/Subjective: Patient seen and examined. Reports right upper quadrant pain (improved since admission)  Objective: Filed Vitals:   01/14/15 0846  BP: 171/49  Pulse: 59  Temp: 98.4 F (36.9 C)  Resp: 18    Intake/Output Summary (Last 24 hours) at 01/14/15 1015 Last data filed at 01/14/15 0846  Gross per 24 hour  Intake    240 ml  Output      0 ml  Net    240 ml   Filed Weights   01/14/15 0458  Weight: 43.727 kg (96 lb 6.4 oz)    Exam:   General:  NAD  HEENT: no pallor, dry mucosa,  Chest: clear b/l   CVS: NS1&S2, no murmurs  GI: soft, ND, RUQ tenderness, BS+  Musculoskeletal: warm, no edema   CNS: alert and oriented    Data Reviewed: Basic Metabolic Panel:  Recent Labs Lab 01/13/15 1831 01/14/15 0821  NA 132* 130*  K 5.7* 6.5*  CL 107 103  CO2 17* 18*  GLUCOSE 211* 162*  BUN 74* 73*  CREATININE 1.92* 1.93*  CALCIUM 8.9 8.7*   Liver Function Tests:  Recent Labs Lab 01/13/15 1831 01/14/15 0821  AST 68* 62*  ALT 75* 69*  ALKPHOS 67 60  BILITOT 0.3  0.3  PROT 7.1 6.9  ALBUMIN 3.9 3.8    Recent Labs Lab 01/13/15 1831  LIPASE 13   No results for input(s): AMMONIA in the last 168 hours. CBC:  Recent Labs Lab 01/13/15 1831  WBC 3.0*  HGB 14.2  HCT 42.1  MCV 101.2*  PLT 178   Cardiac Enzymes: No results for input(s): CKTOTAL, CKMB, CKMBINDEX, TROPONINI in the last 168 hours. BNP (last 3 results)  Recent Labs  09/11/14 1522  BNP 3381.1*    ProBNP (last 3 results) No results for input(s): PROBNP in the  last 8760 hours.  CBG:  Recent Labs Lab 01/14/15 0456 01/14/15 0844  GLUCAP 214* 199*    No results found for this or any previous visit (from the past 240 hour(s)).   Studies: Ct Abdomen Pelvis Wo Contrast  01/14/2015  CLINICAL DATA:  Right upper quadrant pain for 3 days. Nausea and vomiting. EXAM: CT ABDOMEN AND PELVIS WITHOUT CONTRAST TECHNIQUE: Multidetector CT imaging of the abdomen and pelvis was performed following the standard protocol without IV contrast. COMPARISON:  CT 10/28/2014 FINDINGS: Lower chest: The heart is enlarged. Pacemaker wires partially included. Scattered atelectasis in both lower lobes. No pleural effusion. Liver: No focal lesion allowing for lack contrast. Hepatobiliary: Gallbladder physiologically distended. No evidence of biliary dilatation. Pancreas: Sequela of chronic pancreatitis with multifocal pancreatic calcifications. Pancreatic parenchymal atrophy. Spleen: Sequela of granulomatous disease with calcifications, normal in size. Adrenal glands: Not well seen, no evidence of mass. Kidneys: No hydronephrosis. Probable nonobstructing stone lower left kidney. Bilateral renal cysts, suboptimally defined without contrast. Stomach/Bowel: Stomach physiologically distended. Suspect distal duodenal and proximal jejunal wall thickening. Moderate volume of stool throughout the colon without colonic wall thickening, a stool ball distends the rectum. Paucity of intra-abdominal fat limits bowel evaluation. Vascular/Lymphatic: No retroperitoneal adenopathy. Advanced atherosclerosis of the abdominal aorta and its branches. No aneurysm. Reproductive: Uterus surgically absent.  No adnexal mass. Bladder: Physiologically distended, no definite wall thickening. Other: No free air, free fluid, or intra-abdominal fluid collection. Musculoskeletal: There are no acute or suspicious osseous abnormalities. Postsurgical change at L5-S1. Compression deformities of L1, L2, and L4, stable.  IMPRESSION: 1. Wall thickening involving the duodenum and proximal small bowel loops, suspect enteritis. No obstruction. 2. Moderate stool burden with stool ball distending the rectum, query constipation and fecal impaction. 3. Sequela of chronic pancreatitis. Electronically Signed   By: Jeb Levering M.D.   On: 01/14/2015 02:21   Dg Chest 2 View  01/13/2015  CLINICAL DATA:  Right lower rib pain for 1 day with no known injury EXAM: CHEST  2 VIEW COMPARISON:  09/11/2014 FINDINGS: Heart size upper normal. Two lead cardiac pacer again noted. Vascular pattern normal. No consolidation effusion or pneumothorax. Lungs are clear allowing for patient rotation exaggerating conspicuity of right hilum. Right superior mediastinal vascular stent again noted. Bony thorax grossly intact. Moderate to severe right shoulder arthropathy. Bilateral carotid artery calcification in the neck. Aortic calcification again noted. IMPRESSION: No active cardiopulmonary disease. Electronically Signed   By: Skipper Cliche M.D.   On: 01/13/2015 21:49    Scheduled Meds: . aspirin EC  81 mg Oral Daily  . calcium gluconate  1 g Intravenous Once  . ciprofloxacin  200 mg Intravenous Q12H  . clopidogrel  75 mg Oral Daily  . dextrose      . diazepam  5 mg Oral BID  . heparin  5,000 Units Subcutaneous 3 times per day  . hydrALAZINE  100 mg Oral QID  .  insulin aspart  0-9 Units Subcutaneous TID WC  . insulin aspart protamine- aspart  8 Units Subcutaneous Q supper  . isosorbide mononitrate  120 mg Oral Daily  . metoprolol tartrate  25 mg Oral BID  . metronidazole  500 mg Intravenous Q8H  . omega-3 acid ethyl esters  1 g Oral BID  . pantoprazole  40 mg Oral Daily  . polyethylene glycol  17 g Oral Daily  . sodium chloride  3 mL Intravenous Q12H  . tamsulosin  0.4 mg Oral QPC supper   Continuous Infusions: . sodium chloride 75 mL/hr at 01/14/15 0847      Time spent: 20 minutes    Trason Shifflet  [facility] Pager [phone] If 7PM-7AM, please contact night-coverage at [email or web address], password [facility] 01/14/2015, 10:15 AM  LOS: 0 days

## 2015-01-14 NOTE — ED Provider Notes (Signed)
Pt stable No distress Currently awaiting CT imaging Will admit after CT scans   Ripley Fraise, MD 01/14/15 0110

## 2015-01-14 NOTE — Progress Notes (Signed)
CRITICAL VALUE ALERT  Critical value received:  K 6.4  Date of notification:  01/14/15  Time of notification:  7867  Critical value read back:Yes.    Nurse who received alert:  Joellen Jersey, RN.  MD notified (1st page):  Dr. Clementeen Graham  Time of first page:  1711  MD notified (2nd page):  Time of second page:  Responding MD:  [id number]  Time MD responded:  Dr. Clementeen Graham

## 2015-01-15 DIAGNOSIS — Z888 Allergy status to other drugs, medicaments and biological substances status: Secondary | ICD-10-CM | POA: Diagnosis not present

## 2015-01-15 DIAGNOSIS — F17210 Nicotine dependence, cigarettes, uncomplicated: Secondary | ICD-10-CM | POA: Diagnosis present

## 2015-01-15 DIAGNOSIS — Z8673 Personal history of transient ischemic attack (TIA), and cerebral infarction without residual deficits: Secondary | ICD-10-CM | POA: Diagnosis not present

## 2015-01-15 DIAGNOSIS — K5641 Fecal impaction: Secondary | ICD-10-CM | POA: Diagnosis present

## 2015-01-15 DIAGNOSIS — Z681 Body mass index (BMI) 19 or less, adult: Secondary | ICD-10-CM | POA: Diagnosis not present

## 2015-01-15 DIAGNOSIS — E1151 Type 2 diabetes mellitus with diabetic peripheral angiopathy without gangrene: Secondary | ICD-10-CM | POA: Diagnosis not present

## 2015-01-15 DIAGNOSIS — I129 Hypertensive chronic kidney disease with stage 1 through stage 4 chronic kidney disease, or unspecified chronic kidney disease: Secondary | ICD-10-CM | POA: Diagnosis present

## 2015-01-15 DIAGNOSIS — E860 Dehydration: Secondary | ICD-10-CM | POA: Diagnosis not present

## 2015-01-15 DIAGNOSIS — Z7982 Long term (current) use of aspirin: Secondary | ICD-10-CM | POA: Diagnosis not present

## 2015-01-15 DIAGNOSIS — E875 Hyperkalemia: Secondary | ICD-10-CM | POA: Diagnosis present

## 2015-01-15 DIAGNOSIS — F419 Anxiety disorder, unspecified: Secondary | ICD-10-CM | POA: Diagnosis present

## 2015-01-15 DIAGNOSIS — I5031 Acute diastolic (congestive) heart failure: Secondary | ICD-10-CM | POA: Diagnosis not present

## 2015-01-15 DIAGNOSIS — Z950 Presence of cardiac pacemaker: Secondary | ICD-10-CM | POA: Diagnosis not present

## 2015-01-15 DIAGNOSIS — I739 Peripheral vascular disease, unspecified: Secondary | ICD-10-CM | POA: Diagnosis present

## 2015-01-15 DIAGNOSIS — E43 Unspecified severe protein-calorie malnutrition: Secondary | ICD-10-CM | POA: Diagnosis present

## 2015-01-15 DIAGNOSIS — I2510 Atherosclerotic heart disease of native coronary artery without angina pectoris: Secondary | ICD-10-CM | POA: Diagnosis present

## 2015-01-15 DIAGNOSIS — Z79899 Other long term (current) drug therapy: Secondary | ICD-10-CM | POA: Diagnosis not present

## 2015-01-15 DIAGNOSIS — E1165 Type 2 diabetes mellitus with hyperglycemia: Secondary | ICD-10-CM | POA: Diagnosis not present

## 2015-01-15 DIAGNOSIS — Z794 Long term (current) use of insulin: Secondary | ICD-10-CM | POA: Diagnosis not present

## 2015-01-15 DIAGNOSIS — Z91010 Allergy to peanuts: Secondary | ICD-10-CM | POA: Diagnosis not present

## 2015-01-15 DIAGNOSIS — E1122 Type 2 diabetes mellitus with diabetic chronic kidney disease: Secondary | ICD-10-CM | POA: Diagnosis present

## 2015-01-15 DIAGNOSIS — Z7902 Long term (current) use of antithrombotics/antiplatelets: Secondary | ICD-10-CM | POA: Diagnosis not present

## 2015-01-15 DIAGNOSIS — M545 Low back pain: Secondary | ICD-10-CM | POA: Diagnosis present

## 2015-01-15 DIAGNOSIS — H268 Other specified cataract: Secondary | ICD-10-CM | POA: Diagnosis present

## 2015-01-15 DIAGNOSIS — N390 Urinary tract infection, site not specified: Secondary | ICD-10-CM | POA: Diagnosis present

## 2015-01-15 DIAGNOSIS — G8929 Other chronic pain: Secondary | ICD-10-CM | POA: Diagnosis present

## 2015-01-15 DIAGNOSIS — K529 Noninfective gastroenteritis and colitis, unspecified: Secondary | ICD-10-CM | POA: Diagnosis present

## 2015-01-15 DIAGNOSIS — N183 Chronic kidney disease, stage 3 (moderate): Secondary | ICD-10-CM | POA: Diagnosis present

## 2015-01-15 DIAGNOSIS — N179 Acute kidney failure, unspecified: Secondary | ICD-10-CM | POA: Diagnosis not present

## 2015-01-15 DIAGNOSIS — K2980 Duodenitis without bleeding: Secondary | ICD-10-CM | POA: Diagnosis present

## 2015-01-15 LAB — BASIC METABOLIC PANEL
ANION GAP: 6 (ref 5–15)
BUN: 59 mg/dL — ABNORMAL HIGH (ref 6–20)
CALCIUM: 8.4 mg/dL — AB (ref 8.9–10.3)
CO2: 21 mmol/L — ABNORMAL LOW (ref 22–32)
CREATININE: 1.61 mg/dL — AB (ref 0.44–1.00)
Chloride: 111 mmol/L (ref 101–111)
GFR calc Af Amer: 34 mL/min — ABNORMAL LOW (ref >60)
GFR, EST NON AFRICAN AMERICAN: 29 mL/min — AB (ref >60)
GLUCOSE: 68 mg/dL (ref 65–99)
Potassium: 4.3 mmol/L (ref 3.5–5.1)
Sodium: 138 mmol/L (ref 135–145)

## 2015-01-15 LAB — GLUCOSE, CAPILLARY
GLUCOSE-CAPILLARY: 153 mg/dL — AB (ref 65–99)
Glucose-Capillary: 100 mg/dL — ABNORMAL HIGH (ref 65–99)
Glucose-Capillary: 264 mg/dL — ABNORMAL HIGH (ref 65–99)

## 2015-01-15 MED ORDER — CIPROFLOXACIN HCL 250 MG PO TABS
250.0000 mg | ORAL_TABLET | Freq: Two times a day (BID) | ORAL | Status: DC
  Administered 2015-01-15 – 2015-01-16 (×2): 250 mg via ORAL
  Filled 2015-01-15 (×2): qty 1

## 2015-01-15 MED ORDER — METRONIDAZOLE 500 MG PO TABS
500.0000 mg | ORAL_TABLET | Freq: Three times a day (TID) | ORAL | Status: DC
  Administered 2015-01-15 – 2015-01-16 (×3): 500 mg via ORAL
  Filled 2015-01-15 (×3): qty 1

## 2015-01-15 NOTE — Evaluation (Signed)
Physical Therapy Evaluation Patient Details Name: Carmen [name] MRN: [id number] DOB: [birth year] Today's Date: 01/15/2015   History of Present Illness  81 year old female with history of diabetes mellitus type 2, chronic kidney disease, Coronary artery disease, hypertension presented with acute onset of right upper quadrant abdominal pain. Admitted with Acute enteritis/ duodenitis, acute kidney injury, and hyperkalemia.  Clinical Impression  Pt admitted with the above complications. Pt currently with functional limitations due to the deficits listed below (see PT Problem List). Demonstrates mild instability with gait but declines use of an assistive device for support. Reports she has 24/7 assist at home as needed. Good family support. Would benefit from some short term HHPT as pt states her mobility is still quite difficulty compared to her baseline. Pt will benefit from skilled PT to increase their independence and safety with mobility to allow discharge to the venue listed below.       Follow Up Recommendations Home health PT;Supervision for mobility/OOB    Equipment Recommendations  None recommended by PT    Recommendations for Other Services OT consult     Precautions / Restrictions Precautions Precautions: Fall Restrictions Weight Bearing Restrictions: No      Mobility  Bed Mobility Overal bed mobility: Modified Independent             General bed mobility comments: extra time  Transfers Overall transfer level: Needs assistance Equipment used: None Transfers: Sit to/from Stand Sit to Stand: Supervision         General transfer comment: Supervision for safety. Minor sway but able to self correct.  Ambulation/Gait Ambulation/Gait assistance: Min guard Ambulation Distance (Feet): 100 Feet Assistive device: None Gait Pattern/deviations: Step-through pattern;Decreased stride length;Drifts right/left;Trunk flexed;Scissoring;Narrow base of support Gait velocity:  decreased Gait velocity interpretation: <1.8 ft/sec, indicative of risk for recurrent falls General Gait Details: Close guard for safety, initially reaching for furniture in room but declined use of assistive device for support. Progressed to unsupported ambulation in hallway without need for rails. Able to step backwards several feet, performed horizontal/vertical head turns, high march, and perform variable gait speeds. Each task performed with some deviation from straight path. Demos some instability but did not require physical assist to correct.  Stairs            Wheelchair Mobility    Modified Rankin (Stroke Patients Only)       Balance Overall balance assessment: Needs assistance Sitting-balance support: No upper extremity supported;Feet supported Sitting balance-Leahy Scale: Good     Standing balance support: No upper extremity supported Standing balance-Leahy Scale: Fair                               Pertinent Vitals/Pain Pain Assessment: No/denies pain    Home Living Family/patient expects to be discharged to:: Private residence Living Arrangements: Spouse/significant other;Children (son) Available Help at Discharge: Family;Available 24 hours/day Type of Home: House Home Access: Stairs to enter Entrance Stairs-Rails: Psychiatric nurse of Steps: 2 Home Layout: One level Home Equipment: Walker - 2 wheels;Cane - single point;Bedside commode      Prior Function Level of Independence: Independent               Hand Dominance   Dominant Hand: Right    Extremity/Trunk Assessment   Upper Extremity Assessment: Defer to OT evaluation           Lower Extremity Assessment: Generalized weakness  Communication   Communication: No difficulties  Cognition Arousal/Alertness: Awake/alert Behavior During Therapy: WFL for tasks assessed/performed Overall Cognitive Status: Within Functional Limits for tasks assessed                       General Comments      Exercises        Assessment/Plan    PT Assessment Patient needs continued PT services  PT Diagnosis Abnormality of gait;Generalized weakness   PT Problem List Decreased strength;Decreased activity tolerance;Decreased balance;Decreased mobility;Decreased knowledge of use of DME  PT Treatment Interventions DME instruction;Gait training;Stair training;Functional mobility training;Therapeutic activities;Therapeutic exercise;Balance training;Neuromuscular re-education;Patient/family education   PT Goals (Current goals can be found in the Care Plan section) Acute Rehab PT Goals Patient Stated Goal: go home PT Goal Formulation: With patient Time For Goal Achievement: 01/29/15 Potential to Achieve Goals: Good    Frequency Min 3X/week   Barriers to discharge        Co-evaluation               End of Session   Activity Tolerance: Patient tolerated treatment well Patient left: in chair;with call bell/phone within reach Nurse Communication: Mobility status    Functional Assessment Tool Used: clinical observation Functional Limitation: Mobility: Walking and moving around Mobility: Walking and Moving Around Current Status (J4492): At least 1 percent but less than 20 percent impaired, limited or restricted Mobility: Walking and Moving Around Goal Status [phone]): At least 1 percent but less than 20 percent impaired, limited or restricted    Time: 1219-7588 PT Time Calculation (min) (ACUTE ONLY): 15 min   Charges:   PT Evaluation $Initial PT Evaluation Tier I: 1 Procedure     PT G Codes:   PT G-Codes **NOT FOR INPATIENT CLASS** Functional Assessment Tool Used: clinical observation Functional Limitation: Mobility: Walking and moving around Mobility: Walking and Moving Around Current Status (T2549): At least 1 percent but less than 20 percent impaired, limited or restricted Mobility: Walking and Moving Around Goal Status  [phone]): At least 1 percent but less than 20 percent impaired, limited or restricted    Ellouise Newer 01/15/2015, 2:42 PM  Camille Bal [place], [place]

## 2015-01-15 NOTE — Progress Notes (Signed)
[facility] PROGRESS NOTE  Carmen [name] ZJI:[id number] DOB: [birth year] DOA: 01/13/2015 PCP: Maximino Greenland, MD  Same day follow up.  Please refer to admission H&P early this morning for details, in brief, 81 year old female with history of diabetes mellitus type 2, chronic kidney disease, Coronary artery disease, hypertension presented with acute onset of right upper quadrant abdominal pain for the past 2-3 days, being dull with associated nausea and poor appetite and an episode of vomiting on the day of admission. CT of the abdomen and pelvis showing inflammation of the duodenum and proximal small bowel. Labs showed acute kidney injury with creatinine of 1.92 and hyperkalemia with potassium of 5.7 (was on potassium supplement at home). UA was also suggestive of UTI.) CT of the abdomen also showed impacted stool. This morning repeat blood work showed worsening hyperkalemia with potassium 6.5. EKG done showed peaked T waves in anterior leads (unchanged from admission) . Patient is symptomatic. Ordered 1 g Gluconate, D50 with Regular Insulin and Kayexalate.  Assessment/Plan: Acute kidney injury Secondary to dehydration with associated poor by mouth intake and vomiting secondary to underlying enteritis. Hold Lasix. She also on both ACEi and ARB which is  held. Monitor renal function with gentle hydration.  Improving, cr has decrease to 1.6.  Hyperkalemia Patient on potassium supplements at home. Given calcium gluconate, D50 with insulin and Kayexalate. EKG with peaked T waves resolved.   Acute enteritis/ duodenitis Symptoms improving. Continue with  cipro and flagyl.  zofran prn for nausea and morphine for pain. Pain has improved.   Constipation with stool impaction  ordered soap suds enema Resolved.   Essential HTN  on multiple BP meds Continue lopressor and hydralazine  Diastolic CHF  stable  CAD / PVD and hx of stroke On ASA, plavix . Continue BB  Type  2 DM On tradjenta  and novolin at home. Resumed and monitor on SSI.  Protein calorie malnutrition On protein  supplements  DVT prophylaxis: sq heparin  Diet: diabetic/ heart healthy    Code Status: Full code Family Communication: discussed with patient.  Disposition Plan: in next 1-2 days once symptoms improve. Possibly home with services. PT evaluation.   Consultants:  None  Procedures:  CT abdomen and pelvis  Antibiotics:  IV Rocephin 1  IV Cipro and Flagyl 11/8--  HPI/Subjective: Abdominal pain is better , almost resolved. Had BM   Objective: Filed Vitals:   01/15/15 1000  BP: 139/54  Pulse: 60  Temp: 98.2 F (36.8 C)  Resp: 19    Intake/Output Summary (Last 24 hours) at 01/15/15 1140 Last data filed at 01/15/15 0859  Gross per 24 hour  Intake   2825 ml  Output    900 ml  Net   1925 ml   Filed Weights   01/14/15 0458 01/14/15 2034 01/14/15 2042  Weight: 43.727 kg (96 lb 6.4 oz) 44.1 kg (97 lb 3.6 oz) 44.6 kg (98 lb 5.2 oz)    Exam:   General:  NAD  HEENT: no pallor, dry mucosa,  Chest: clear b/l   CVS: NS1&S2, no murmurs  GI: soft, ND, RUQ tenderness, BS+  Musculoskeletal: warm, no edema   CNS: alert and oriented    Data Reviewed: Basic Metabolic Panel:  Recent Labs Lab 01/13/15 1831 01/14/15 0821 01/14/15 1531 01/15/15 0401  NA 132* 130*  --  138  K 5.7* 6.5* 6.4* 4.3  CL 107 103  --  111  CO2 17* 18*  --  21*  GLUCOSE 211* 162*  --  68  BUN 74* 73*  --  59*  CREATININE 1.92* 1.93*  --  1.61*  CALCIUM 8.9 8.7*  --  8.4*   Liver Function Tests:  Recent Labs Lab 01/13/15 1831 01/14/15 0821  AST 68* 62*  ALT 75* 69*  ALKPHOS 67 60  BILITOT 0.3 0.3  PROT 7.1 6.9  ALBUMIN 3.9 3.8    Recent Labs Lab 01/13/15 1831  LIPASE 13   No results for input(s): AMMONIA in the last 168 hours. CBC:  Recent Labs Lab 01/13/15 1831  WBC 3.0*  HGB 14.2  HCT 42.1  MCV 101.2*  PLT 178   Cardiac Enzymes: No results for input(s):  CKTOTAL, CKMB, CKMBINDEX, TROPONINI in the last 168 hours. BNP (last 3 results)  Recent Labs  09/11/14 1522  BNP 3381.1*    ProBNP (last 3 results) No results for input(s): PROBNP in the last 8760 hours.  CBG:  Recent Labs Lab 01/14/15 0844 01/14/15 1207 01/14/15 1717 01/14/15 2041 01/15/15 0804  GLUCAP 199* 158* 163* 134* 100*    No results found for this or any previous visit (from the past 240 hour(s)).   Studies: Ct Abdomen Pelvis Wo Contrast  01/14/2015  CLINICAL DATA:  Right upper quadrant pain for 3 days. Nausea and vomiting. EXAM: CT ABDOMEN AND PELVIS WITHOUT CONTRAST TECHNIQUE: Multidetector CT imaging of the abdomen and pelvis was performed following the standard protocol without IV contrast. COMPARISON:  CT 10/28/2014 FINDINGS: Lower chest: The heart is enlarged. Pacemaker wires partially included. Scattered atelectasis in both lower lobes. No pleural effusion. Liver: No focal lesion allowing for lack contrast. Hepatobiliary: Gallbladder physiologically distended. No evidence of biliary dilatation. Pancreas: Sequela of chronic pancreatitis with multifocal pancreatic calcifications. Pancreatic parenchymal atrophy. Spleen: Sequela of granulomatous disease with calcifications, normal in size. Adrenal glands: Not well seen, no evidence of mass. Kidneys: No hydronephrosis. Probable nonobstructing stone lower left kidney. Bilateral renal cysts, suboptimally defined without contrast. Stomach/Bowel: Stomach physiologically distended. Suspect distal duodenal and proximal jejunal wall thickening. Moderate volume of stool throughout the colon without colonic wall thickening, a stool ball distends the rectum. Paucity of intra-abdominal fat limits bowel evaluation. Vascular/Lymphatic: No retroperitoneal adenopathy. Advanced atherosclerosis of the abdominal aorta and its branches. No aneurysm. Reproductive: Uterus surgically absent.  No adnexal mass. Bladder: Physiologically distended, no  definite wall thickening. Other: No free air, free fluid, or intra-abdominal fluid collection. Musculoskeletal: There are no acute or suspicious osseous abnormalities. Postsurgical change at L5-S1. Compression deformities of L1, L2, and L4, stable. IMPRESSION: 1. Wall thickening involving the duodenum and proximal small bowel loops, suspect enteritis. No obstruction. 2. Moderate stool burden with stool ball distending the rectum, query constipation and fecal impaction. 3. Sequela of chronic pancreatitis. Electronically Signed   By: Jeb Levering M.D.   On: 01/14/2015 02:21   Dg Chest 2 View  01/13/2015  CLINICAL DATA:  Right lower rib pain for 1 day with no known injury EXAM: CHEST  2 VIEW COMPARISON:  09/11/2014 FINDINGS: Heart size upper normal. Two lead cardiac pacer again noted. Vascular pattern normal. No consolidation effusion or pneumothorax. Lungs are clear allowing for patient rotation exaggerating conspicuity of right hilum. Right superior mediastinal vascular stent again noted. Bony thorax grossly intact. Moderate to severe right shoulder arthropathy. Bilateral carotid artery calcification in the neck. Aortic calcification again noted. IMPRESSION: No active cardiopulmonary disease. Electronically Signed   By: Skipper Cliche M.D.   On: 01/13/2015 21:49    Scheduled Meds: . aspirin EC  81 mg  Oral Daily  . ciprofloxacin  200 mg Intravenous Q12H  . clopidogrel  75 mg Oral Daily  . diazepam  5 mg Oral BID  . feeding supplement (GLUCERNA SHAKE)  237 mL Oral BID BM  . heparin  5,000 Units Subcutaneous 3 times per day  . hydrALAZINE  100 mg Oral QID  . insulin aspart  0-9 Units Subcutaneous TID WC  . insulin aspart protamine- aspart  8 Units Subcutaneous Q supper  . isosorbide mononitrate  120 mg Oral Daily  . metoprolol tartrate  25 mg Oral BID  . metronidazole  500 mg Intravenous Q8H  . omega-3 acid ethyl esters  1 g Oral BID  . pantoprazole  40 mg Oral Daily  . polyethylene glycol  17  g Oral Daily  . sodium chloride  3 mL Intravenous Q12H  . tamsulosin  0.4 mg Oral QPC supper   Continuous Infusions: . sodium chloride 75 mL/hr at 01/15/15 8916      Time spent: 25 minutes    Regalado, Belkys A  [facility] Pager [phone] If 7PM-7AM, please contact night-coverage at [email or web address], password [facility] 01/15/2015, 11:40 AM  LOS: 1 day

## 2015-01-16 DIAGNOSIS — E1151 Type 2 diabetes mellitus with diabetic peripheral angiopathy without gangrene: Secondary | ICD-10-CM

## 2015-01-16 DIAGNOSIS — E1165 Type 2 diabetes mellitus with hyperglycemia: Secondary | ICD-10-CM

## 2015-01-16 LAB — BASIC METABOLIC PANEL
ANION GAP: 7 (ref 5–15)
BUN: 42 mg/dL — ABNORMAL HIGH (ref 6–20)
CALCIUM: 8 mg/dL — AB (ref 8.9–10.3)
CO2: 19 mmol/L — AB (ref 22–32)
CREATININE: 1.5 mg/dL — AB (ref 0.44–1.00)
Chloride: 113 mmol/L — ABNORMAL HIGH (ref 101–111)
GFR calc Af Amer: 37 mL/min — ABNORMAL LOW (ref >60)
GFR, EST NON AFRICAN AMERICAN: 32 mL/min — AB (ref >60)
GLUCOSE: 111 mg/dL — AB (ref 65–99)
Potassium: 4.5 mmol/L (ref 3.5–5.1)
Sodium: 139 mmol/L (ref 135–145)

## 2015-01-16 LAB — GLUCOSE, CAPILLARY
GLUCOSE-CAPILLARY: 357 mg/dL — AB (ref 65–99)
GLUCOSE-CAPILLARY: 119 mg/dL — AB (ref 65–99)

## 2015-01-16 LAB — URINE CULTURE

## 2015-01-16 MED ORDER — PANTOPRAZOLE SODIUM 40 MG PO TBEC: 40.0000 mg | DELAYED_RELEASE_TABLET | Freq: Every day | ORAL | Status: DC

## 2015-01-16 MED ORDER — HYDRALAZINE HCL 100 MG PO TABS: 100.0000 mg | ORAL_TABLET | Freq: Four times a day (QID) | ORAL | Status: DC

## 2015-01-16 MED ORDER — HYDRALAZINE HCL 50 MG PO TABS: 100.0000 mg | ORAL_TABLET | Freq: Four times a day (QID) | ORAL | Status: DC

## 2015-01-16 MED ORDER — METRONIDAZOLE 500 MG PO TABS: 500.0000 mg | ORAL_TABLET | Freq: Three times a day (TID) | ORAL | Status: DC

## 2015-01-16 MED ORDER — CIPROFLOXACIN HCL 250 MG PO TABS: 250.0000 mg | ORAL_TABLET | Freq: Two times a day (BID) | ORAL | Status: DC

## 2015-01-16 MED ORDER — GLUCERNA SHAKE PO LIQD: 237.0000 mL | Freq: Two times a day (BID) | ORAL | Status: DC

## 2015-01-16 NOTE — Evaluation (Signed)
Occupational Therapy Evaluation Patient Details Name: ELNORIA SCHLEISMAN MRN: [id number] DOB: 22-Nov-1933 Today's Date: 01/16/2015    History of Present Illness 81 year old female with history of diabetes mellitus type 2, chronic kidney disease, Coronary artery disease, hypertension presented with acute onset of right upper quadrant abdominal pain. Admitted with Acute enteritis/ duodenitis, acute kidney injury, and hyperkalemia.   Clinical Impression   Pt admitted to hospital due to reason stated above. Pt currently with functional limitiations due to the deficits listed below (see OT problem list). Prior to admission pt was independent with ADLs, pt currently at baseline. Pt reports she has 24/7 assist at home as needed and has good family support, grand-daughter present during session. Pt demonstrates mild instability during functional mobility for ADLs, however declines using assistive device to increase safety. All education complete, feel pt will benefit from intermittent supervision to maximize safety with adls and balance to remain safe at home.    Follow Up Recommendations  Supervision - Intermittent    Equipment Recommendations  None recommended by OT    Recommendations for Other Services       Precautions / Restrictions Precautions Precautions: Fall Restrictions Weight Bearing Restrictions: No      Mobility Bed Mobility               General bed mobility comments: Pt sitting EOB upon arrival  Transfers Overall transfer level: Needs assistance Equipment used: None Transfers: Sit to/from Stand Sit to Stand: Supervision         General transfer comment: Supervision for safety, noted slight sway but able to self correct    Balance Overall balance assessment: Needs assistance Sitting-balance support: No upper extremity supported;Feet supported Sitting balance-Leahy Scale: Good     Standing balance support: No upper extremity supported Standing balance-Leahy  Scale: Fair                              ADL Overall ADL's : Needs assistance/impaired                     Lower Body Dressing: Modified independent;Sitting/lateral leans;Bed level Lower Body Dressing Details (indicate cue type and reason): pt able to don/doff bil LE socks Toilet Transfer: Modified Independent;Ambulation;BSC;Comfort height toilet Toilet Transfer Details (indicate cue type and reason): BSC positioned over toilet         Functional mobility during ADLs: Supervision/safety General ADL Comments: Pt at baseline for ADLs, recommended pt utilize rolling walker for functional mobility to increase pt's safety and decrease risk of falling. Noted pt using furniture in room to assist with functional mobility to bathroom to perform toilet transfer. Pt agreeable to recommendation however, therapist questions pt compliance due to mild cognitive deficits. Pt reported installing "grab bars" in bathroom to assist with toilet and showering safety. Pt reports supervision assistance from grand-daughter when transferring in/out of shower.     Vision     Perception     Praxis      Pertinent Vitals/Pain Pain Assessment: No/denies pain     Hand Dominance Right   Extremity/Trunk Assessment Upper Extremity Assessment Upper Extremity Assessment: RUE deficits/detail RUE Deficits / Details: Long standing residual deficits from stroke and tia in 1999 RUE Coordination: decreased fine motor;decreased gross motor   Lower Extremity Assessment Lower Extremity Assessment: Defer to PT evaluation   Cervical / Trunk Assessment Cervical / Trunk Assessment: Normal   Communication Communication Communication: No difficulties   Cognition Arousal/Alertness:  Awake/alert Behavior During Therapy: WFL for tasks assessed/performed Overall Cognitive Status: History of cognitive impairments - at baseline                     General Comments       Exercises        Shoulder Instructions      Home Living Family/patient expects to be discharged to:: Private residence Living Arrangements: Spouse/significant other;Children Available Help at Discharge: Family;Available 24 hours/day Type of Home: House Home Access: Stairs to enter [employer] of Steps: 2 Entrance Stairs-Rails: Right;Left Home Layout: One level     Bathroom Shower/Tub: Therapist, occupational: Handicapped height     Home Equipment: Environmental consultant - 2 wheels;Cane - single point;Bedside commode;Shower seat;Hand held shower head          Prior Functioning/Environment Level of Independence: Independent        Comments: reports having walkers for mobility however does not use    OT Diagnosis: Generalized weakness;Cognitive deficits;Acute pain   OT Problem List: Decreased strength;Decreased activity tolerance;Decreased range of motion;Pain   OT Treatment/Interventions:      OT Goals(Current goals can be found in the care plan section) Acute Rehab OT Goals Patient Stated Goal: go home today  OT Frequency:     Barriers to D/C:            Co-evaluation              End of Session Equipment Utilized During Treatment: Gait belt  Activity Tolerance: Patient tolerated treatment well Patient left: in bed;with call bell/phone within reach;with bed alarm set (sitting EOB)   Time: [id number] OT Time Calculation (min): 16 min Charges:  OT General Charges $OT Visit: 1 Procedure OT Evaluation $Initial OT Evaluation Tier I: 1 Procedure G-Codes:    Lin Landsman 01-24-2015, 12:09 PM

## 2015-01-16 NOTE — Discharge Summary (Signed)
Physician Discharge Summary  Carmen [name] [id number] DOB: Jun 06, 1933 DOA: 01/13/2015  PCP: Maximino Greenland, MD  Admit date: 01/13/2015 Discharge date: 01/16/2015  Time spent: 35 minutes  Recommendations for Outpatient Follow-up:  1. Needs B-met to follow renal function. 2. Resume lasix if renal function stable and retaining  fluids.  3.   Discharge Diagnoses:    AKI (acute kidney injury) ([place])   Acute enteritis/ duodenitis   DM (diabetes mellitus), type 2, uncontrolled, periph vascular complic ([facility])   HTN (hypertension)   Dehydration   Acute diastolic heart failure ([facility])   Hyperkalemia   Discharge Condition: stable.   Diet recommendation: heart healthy  Filed Weights   01/14/15 2034 01/14/15 2042 01/15/15 2120  Weight: 44.1 kg (97 lb 3.6 oz) 44.6 kg (98 lb 5.2 oz) 46.085 kg (101 lb 9.6 oz)    History of present illness:  Please refer to admission H&P  for details, in brief, 81 year old female with history of diabetes mellitus type 2, chronic kidney disease, Coronary artery disease, hypertension presented with acute onset of right upper quadrant abdominal pain for the past 2-3 days, being dull with associated nausea and poor appetite and an episode of vomiting on the day of admission. CT of the abdomen and pelvis showing inflammation of the duodenum and proximal small bowel. Labs showed acute kidney injury with creatinine of 1.92 and hyperkalemia with potassium of 5.7 (was on potassium supplement at home). UA was also suggestive of UTI.) CT of the abdomen also showed impacted stool. This morning repeat blood work showed worsening hyperkalemia with potassium 6.5. EKG done showed peaked T waves in anterior leads (unchanged from admission) . Patient is symptomatic. Ordered 1 g Gluconate, D50 with Regular Insulin and Kayexalate.  Hospital Course:  Assessment/Plan: Acute kidney injury Secondary to dehydration with associated poor by mouth intake and vomiting secondary to  underlying enteritis. Hold Lasix. She also on both ACEi and ARB which is held. Monitor renal function with gentle hydration.  Improving, cr has decrease to 1.5. Will hold lasix at discharge. Advised to call PCP if patient start retaining fluids.   Hyperkalemia; resolved.  Patient on potassium supplements at home. Given calcium gluconate, D50 with insulin and Kayexalate. EKG with peaked T waves resolved.  Discontinue potasium supplements at discharge/   Acute enteritis/ duodenitis Symptoms improving. received cipro and flagyl for 3 days. Plan to discharge with 5 more days of antibiotics.  zofran prn for nausea and morphine for pain. Pain has improved.   Constipation with stool impaction ordered soap suds enema Resolved.   Essential HTN on multiple BP meds Continue lopressor and hydralazine  Diastolic CHF stable  CAD / PVD and hx of stroke On ASA, plavix . Continue BB  Type 2 DM On tradjenta and novolin at home. Resumed and monitor on SSI.  Protein calorie malnutrition On protein supplements  Procedures:  none  Consultations:  none  Discharge Exam: Filed Vitals:   01/16/15 0746  BP: 148/53  Pulse: 59  Temp: 98.2 F (36.8 C)  Resp: 17    General: Alert in no ditress Cardiovascular: S 1, S 2 RRR Respiratory: CTA  Discharge Instructions   Discharge Instructions    Diet Carb Modified    Complete by:  As directed      Increase activity slowly    Complete by:  As directed           Current Discharge Medication List    START taking these medications   Details  ciprofloxacin (  CIPRO) 250 MG tablet Take 1 tablet (250 mg total) by mouth 2 (two) times daily. Qty: 10 tablet, Refills: 0    feeding supplement, GLUCERNA SHAKE, (GLUCERNA SHAKE) LIQD Take 237 mLs by mouth 2 (two) times daily between meals. Qty: 30 Can, Refills: 0    metroNIDAZOLE (FLAGYL) 500 MG tablet Take 1 tablet (500 mg total) by mouth every 8 (eight) hours. Qty: 15 tablet, Refills:  0    pantoprazole (PROTONIX) 40 MG tablet Take 1 tablet (40 mg total) by mouth daily. Qty: 10 tablet, Refills: 0      CONTINUE these medications which have CHANGED   Details  hydrALAZINE (APRESOLINE) 100 MG tablet Take 1 tablet (100 mg total) by mouth 4 (four) times daily. Qty: 120 tablet, Refills: 4      CONTINUE these medications which have NOT CHANGED   Details  aspirin EC 81 MG tablet Take 81 mg by mouth daily.    clopidogrel (PLAVIX) 75 MG tablet Take 75 mg by mouth daily.    diazepam (VALIUM) 5 MG tablet Take 1 tablet (5 mg total) by mouth 2 (two) times daily. Qty: 15 tablet, Refills: 0    isosorbide mononitrate (IMDUR) 120 MG 24 hr tablet Take 1 tablet (120 mg total) by mouth daily. Qty: 30 tablet, Refills: 0    linagliptin (TRADJENTA) 5 MG TABS tablet Take 1 tablet (5 mg total) by mouth daily. Qty: 30 tablet    metoprolol tartrate (LOPRESSOR) 25 MG tablet Take 25 mg by mouth 2 (two) times daily.    NOVOLIN 70/30 (70-30) 100 UNIT/ML injection Inject 8 Units into the skin daily with supper.  Refills: 0    Omega-3 Fatty Acids (FISH OIL PEARLS) 300 MG CAPS Take 1 tablet by mouth 2 (two) times daily.    acetaminophen-codeine (TYLENOL #4) 300-60 MG tablet Take 1 tablet by mouth every 6 (six) hours as needed for moderate pain. Qty: 15 tablet, Refills: 0    tamsulosin (FLOMAX) 0.4 MG CAPS capsule Take 1 capsule (0.4 mg total) by mouth daily after supper. Qty: 30 capsule, Refills: 0      STOP taking these medications     benazepril (LOTENSIN) 40 MG tablet      furosemide (LASIX) 20 MG tablet      lisinopril (ZESTRIL) 10 MG tablet      potassium chloride (K-DUR) 10 MEQ tablet      potassium chloride SA (K-DUR,KLOR-CON) 10 MEQ tablet        Allergies  Allergen Reactions  . Peanuts [Peanut Oil] Swelling  . Norvasc [Amlodipine Besylate] Swelling  . Peanut-Containing Drug Products Other (See Comments)    Cause my stomach to hurt   Follow-up Information     Follow up with Maximino Greenland, MD In 1 week.   Specialty:  Internal Medicine   Contact information:   [address] [place] [address] [phone]        The results of significant diagnostics from this hospitalization (including imaging, microbiology, ancillary and laboratory) are listed below for reference.    Significant Diagnostic Studies: Ct Abdomen Pelvis Wo Contrast  01/14/2015  CLINICAL DATA:  Right upper quadrant pain for 3 days. Nausea and vomiting. EXAM: CT ABDOMEN AND PELVIS WITHOUT CONTRAST TECHNIQUE: Multidetector CT imaging of the abdomen and pelvis was performed following the standard protocol without IV contrast. COMPARISON:  CT 10/28/2014 FINDINGS: Lower chest: The heart is enlarged. Pacemaker wires partially included. Scattered atelectasis in both lower lobes. No pleural effusion. Liver: No focal  lesion allowing for lack contrast. Hepatobiliary: Gallbladder physiologically distended. No evidence of biliary dilatation. Pancreas: Sequela of chronic pancreatitis with multifocal pancreatic calcifications. Pancreatic parenchymal atrophy. Spleen: Sequela of granulomatous disease with calcifications, normal in size. Adrenal glands: Not well seen, no evidence of mass. Kidneys: No hydronephrosis. Probable nonobstructing stone lower left kidney. Bilateral renal cysts, suboptimally defined without contrast. Stomach/Bowel: Stomach physiologically distended. Suspect distal duodenal and proximal jejunal wall thickening. Moderate volume of stool throughout the colon without colonic wall thickening, a stool ball distends the rectum. Paucity of intra-abdominal fat limits bowel evaluation. Vascular/Lymphatic: No retroperitoneal adenopathy. Advanced atherosclerosis of the abdominal aorta and its branches. No aneurysm. Reproductive: Uterus surgically absent.  No adnexal mass. Bladder: Physiologically distended, no definite wall thickening. Other: No free air, free fluid, or  intra-abdominal fluid collection. Musculoskeletal: There are no acute or suspicious osseous abnormalities. Postsurgical change at L5-S1. Compression deformities of L1, L2, and L4, stable. IMPRESSION: 1. Wall thickening involving the duodenum and proximal small bowel loops, suspect enteritis. No obstruction. 2. Moderate stool burden with stool ball distending the rectum, query constipation and fecal impaction. 3. Sequela of chronic pancreatitis. Electronically Signed   By: Jeb Levering M.D.   On: 01/14/2015 02:21   Dg Chest 2 View  01/13/2015  CLINICAL DATA:  Right lower rib pain for 1 day with no known injury EXAM: CHEST  2 VIEW COMPARISON:  09/11/2014 FINDINGS: Heart size upper normal. Two lead cardiac pacer again noted. Vascular pattern normal. No consolidation effusion or pneumothorax. Lungs are clear allowing for patient rotation exaggerating conspicuity of right hilum. Right superior mediastinal vascular stent again noted. Bony thorax grossly intact. Moderate to severe right shoulder arthropathy. Bilateral carotid artery calcification in the neck. Aortic calcification again noted. IMPRESSION: No active cardiopulmonary disease. Electronically Signed   By: Skipper Cliche M.D.   On: 01/13/2015 21:49    Microbiology: Recent Results (from the past 240 hour(s))  Urine culture     Status: None (Preliminary result)   Collection Time: 01/13/15  8:34 PM  Result Value Ref Range Status   Specimen Description URINE, CLEAN CATCH  Final   Special Requests ADDED [id number]  Final   Culture CULTURE REINCUBATED FOR BETTER GROWTH  Final   Report Status PENDING  Incomplete     Labs: Basic Metabolic Panel:  Recent Labs Lab 01/13/15 1831 01/14/15 0821 01/14/15 1531 01/15/15 0401 01/16/15 0627  NA 132* 130*  --  138 139  K 5.7* 6.5* 6.4* 4.3 4.5  CL 107 103  --  111 113*  CO2 17* 18*  --  21* 19*  GLUCOSE 211* 162*  --  68 111*  BUN 74* 73*  --  59* 42*  CREATININE 1.92* 1.93*  --  1.61* 1.50*   CALCIUM 8.9 8.7*  --  8.4* 8.0*   Liver Function Tests:  Recent Labs Lab 01/13/15 1831 01/14/15 0821  AST 68* 62*  ALT 75* 69*  ALKPHOS 67 60  BILITOT 0.3 0.3  PROT 7.1 6.9  ALBUMIN 3.9 3.8    Recent Labs Lab 01/13/15 1831  LIPASE 13   No results for input(s): AMMONIA in the last 168 hours. CBC:  Recent Labs Lab 01/13/15 1831  WBC 3.0*  HGB 14.2  HCT 42.1  MCV 101.2*  PLT 178   Cardiac Enzymes: No results for input(s): CKTOTAL, CKMB, CKMBINDEX, TROPONINI in the last 168 hours. BNP: BNP (last 3 results)  Recent Labs  09/11/14 1522  BNP 3381.1*    ProBNP (last 3  results) No results for input(s): PROBNP in the last 8760 hours.  CBG:  Recent Labs Lab 01/15/15 0804 01/15/15 1128 01/15/15 1625 01/15/15 2117 01/16/15 0850  GLUCAP 100* 264* 357* 153* 119*       Signed:  Gorden Stthomas A  [facility] 01/16/2015, 9:48 AM

## 2015-01-16 NOTE — Progress Notes (Signed)
[facility]  Patient Status: Active (receiving services up to time of hospitalization)  [facility] is providing the following services: RN  If patient discharges after hours, please call [phone].   Carmen [name] 01/16/2015, 9:33 AM

## 2015-01-16 NOTE — Progress Notes (Signed)
Patient's discharged papers printed,explained and given to patient's daughter.Explained that her pain medication would make her constipated.Advised to increased fibers in her diet,mobility as patient's tolerated and seek advise from her primary M.D.,If she needs stool softener.Questions were answered satisfactorily at the time of this discharged.

## 2015-01-16 NOTE — Care Management Note (Signed)
Case Management Note  Patient Details  Name: Carmen [name] MRN: [id number] Date of Birth: 08/18/1933  Subjective/Objective:              CM following for progression and d/c planning.      Action/Plan: 01/16/2015 Met with pt and daughter re [facility] services, both state that pt is active with [facility] and wish to continue that service. This CM explained that a new order would be sent to [facility] and services would continue. [place] notified of plan to d/c and new orders.   Expected Discharge Date:       01/16/2015           Expected Discharge Plan:  [place]  In-House Referral:  NA  Discharge planning Services  CM Consult  Post Acute Care Choice:  Home Health Choice offered to:  Patient  DME Arranged:    DME Agency:     HH Arranged:  RN, PT [place] Agency:  [facility]  Status of Service:  Completed, signed off  Medicare Important Message Given:    Date Medicare IM Given:    Medicare IM give by:    Date Additional Medicare IM Given:    Additional Medicare Important Message give by:     If discussed at [place] of Stay Meetings, dates discussed:    Additional Comments:  Adron Bene, RN 01/16/2015, 1:10 PM

## 2015-01-17 DIAGNOSIS — M25511 Pain in right shoulder: Secondary | ICD-10-CM | POA: Diagnosis not present

## 2015-01-17 DIAGNOSIS — Z8673 Personal history of transient ischemic attack (TIA), and cerebral infarction without residual deficits: Secondary | ICD-10-CM | POA: Diagnosis not present

## 2015-01-17 DIAGNOSIS — E1151 Type 2 diabetes mellitus with diabetic peripheral angiopathy without gangrene: Secondary | ICD-10-CM | POA: Diagnosis not present

## 2015-01-17 DIAGNOSIS — M545 Low back pain: Secondary | ICD-10-CM | POA: Diagnosis not present

## 2015-01-17 DIAGNOSIS — M5126 Other intervertebral disc displacement, lumbar region: Secondary | ICD-10-CM | POA: Diagnosis not present

## 2015-01-17 DIAGNOSIS — F419 Anxiety disorder, unspecified: Secondary | ICD-10-CM | POA: Diagnosis not present

## 2015-01-17 DIAGNOSIS — Z720 Tobacco use: Secondary | ICD-10-CM | POA: Diagnosis not present

## 2015-01-17 DIAGNOSIS — Z950 Presence of cardiac pacemaker: Secondary | ICD-10-CM | POA: Diagnosis not present

## 2015-01-17 DIAGNOSIS — I10 Essential (primary) hypertension: Secondary | ICD-10-CM | POA: Diagnosis not present

## 2015-01-17 DIAGNOSIS — I2510 Atherosclerotic heart disease of native coronary artery without angina pectoris: Secondary | ICD-10-CM | POA: Diagnosis not present

## 2015-01-20 DIAGNOSIS — M25511 Pain in right shoulder: Secondary | ICD-10-CM | POA: Diagnosis not present

## 2015-01-20 DIAGNOSIS — M5126 Other intervertebral disc displacement, lumbar region: Secondary | ICD-10-CM | POA: Diagnosis not present

## 2015-01-20 DIAGNOSIS — Z950 Presence of cardiac pacemaker: Secondary | ICD-10-CM | POA: Diagnosis not present

## 2015-01-20 DIAGNOSIS — E1151 Type 2 diabetes mellitus with diabetic peripheral angiopathy without gangrene: Secondary | ICD-10-CM | POA: Diagnosis not present

## 2015-01-20 DIAGNOSIS — I10 Essential (primary) hypertension: Secondary | ICD-10-CM | POA: Diagnosis not present

## 2015-01-20 DIAGNOSIS — M545 Low back pain: Secondary | ICD-10-CM | POA: Diagnosis not present

## 2015-01-20 DIAGNOSIS — F419 Anxiety disorder, unspecified: Secondary | ICD-10-CM | POA: Diagnosis not present

## 2015-01-20 DIAGNOSIS — I2510 Atherosclerotic heart disease of native coronary artery without angina pectoris: Secondary | ICD-10-CM | POA: Diagnosis not present

## 2015-01-20 DIAGNOSIS — Z8673 Personal history of transient ischemic attack (TIA), and cerebral infarction without residual deficits: Secondary | ICD-10-CM | POA: Diagnosis not present

## 2015-01-20 DIAGNOSIS — Z720 Tobacco use: Secondary | ICD-10-CM | POA: Diagnosis not present

## 2015-01-21 DIAGNOSIS — F419 Anxiety disorder, unspecified: Secondary | ICD-10-CM | POA: Diagnosis not present

## 2015-01-21 DIAGNOSIS — M25511 Pain in right shoulder: Secondary | ICD-10-CM | POA: Diagnosis not present

## 2015-01-21 DIAGNOSIS — M545 Low back pain: Secondary | ICD-10-CM | POA: Diagnosis not present

## 2015-01-21 DIAGNOSIS — Z720 Tobacco use: Secondary | ICD-10-CM | POA: Diagnosis not present

## 2015-01-21 DIAGNOSIS — I2510 Atherosclerotic heart disease of native coronary artery without angina pectoris: Secondary | ICD-10-CM | POA: Diagnosis not present

## 2015-01-21 DIAGNOSIS — E1151 Type 2 diabetes mellitus with diabetic peripheral angiopathy without gangrene: Secondary | ICD-10-CM | POA: Diagnosis not present

## 2015-01-21 DIAGNOSIS — I10 Essential (primary) hypertension: Secondary | ICD-10-CM | POA: Diagnosis not present

## 2015-01-21 DIAGNOSIS — Z950 Presence of cardiac pacemaker: Secondary | ICD-10-CM | POA: Diagnosis not present

## 2015-01-21 DIAGNOSIS — M5126 Other intervertebral disc displacement, lumbar region: Secondary | ICD-10-CM | POA: Diagnosis not present

## 2015-01-21 DIAGNOSIS — Z8673 Personal history of transient ischemic attack (TIA), and cerebral infarction without residual deficits: Secondary | ICD-10-CM | POA: Diagnosis not present

## 2015-01-23 DIAGNOSIS — I1310 Hypertensive heart and chronic kidney disease without heart failure, with stage 1 through stage 4 chronic kidney disease, or unspecified chronic kidney disease: Secondary | ICD-10-CM | POA: Diagnosis not present

## 2015-01-23 DIAGNOSIS — E1122 Type 2 diabetes mellitus with diabetic chronic kidney disease: Secondary | ICD-10-CM | POA: Diagnosis not present

## 2015-01-24 DIAGNOSIS — I2510 Atherosclerotic heart disease of native coronary artery without angina pectoris: Secondary | ICD-10-CM | POA: Diagnosis not present

## 2015-01-24 DIAGNOSIS — Z950 Presence of cardiac pacemaker: Secondary | ICD-10-CM | POA: Diagnosis not present

## 2015-01-24 DIAGNOSIS — M545 Low back pain: Secondary | ICD-10-CM | POA: Diagnosis not present

## 2015-01-24 DIAGNOSIS — Z720 Tobacco use: Secondary | ICD-10-CM | POA: Diagnosis not present

## 2015-01-24 DIAGNOSIS — Z8673 Personal history of transient ischemic attack (TIA), and cerebral infarction without residual deficits: Secondary | ICD-10-CM | POA: Diagnosis not present

## 2015-01-24 DIAGNOSIS — F419 Anxiety disorder, unspecified: Secondary | ICD-10-CM | POA: Diagnosis not present

## 2015-01-24 DIAGNOSIS — I10 Essential (primary) hypertension: Secondary | ICD-10-CM | POA: Diagnosis not present

## 2015-01-24 DIAGNOSIS — E1151 Type 2 diabetes mellitus with diabetic peripheral angiopathy without gangrene: Secondary | ICD-10-CM | POA: Diagnosis not present

## 2015-01-24 DIAGNOSIS — M25511 Pain in right shoulder: Secondary | ICD-10-CM | POA: Diagnosis not present

## 2015-01-24 DIAGNOSIS — M5126 Other intervertebral disc displacement, lumbar region: Secondary | ICD-10-CM | POA: Diagnosis not present

## 2015-01-28 DIAGNOSIS — I10 Essential (primary) hypertension: Secondary | ICD-10-CM | POA: Diagnosis not present

## 2015-01-28 DIAGNOSIS — Z8673 Personal history of transient ischemic attack (TIA), and cerebral infarction without residual deficits: Secondary | ICD-10-CM | POA: Diagnosis not present

## 2015-01-28 DIAGNOSIS — M5126 Other intervertebral disc displacement, lumbar region: Secondary | ICD-10-CM | POA: Diagnosis not present

## 2015-01-28 DIAGNOSIS — Z950 Presence of cardiac pacemaker: Secondary | ICD-10-CM | POA: Diagnosis not present

## 2015-01-28 DIAGNOSIS — F419 Anxiety disorder, unspecified: Secondary | ICD-10-CM | POA: Diagnosis not present

## 2015-01-28 DIAGNOSIS — E1151 Type 2 diabetes mellitus with diabetic peripheral angiopathy without gangrene: Secondary | ICD-10-CM | POA: Diagnosis not present

## 2015-01-28 DIAGNOSIS — M25511 Pain in right shoulder: Secondary | ICD-10-CM | POA: Diagnosis not present

## 2015-01-28 DIAGNOSIS — Z720 Tobacco use: Secondary | ICD-10-CM | POA: Diagnosis not present

## 2015-01-28 DIAGNOSIS — I2510 Atherosclerotic heart disease of native coronary artery without angina pectoris: Secondary | ICD-10-CM | POA: Diagnosis not present

## 2015-01-28 DIAGNOSIS — M545 Low back pain: Secondary | ICD-10-CM | POA: Diagnosis not present

## 2015-01-31 DIAGNOSIS — E1151 Type 2 diabetes mellitus with diabetic peripheral angiopathy without gangrene: Secondary | ICD-10-CM | POA: Diagnosis not present

## 2015-01-31 DIAGNOSIS — I10 Essential (primary) hypertension: Secondary | ICD-10-CM | POA: Diagnosis not present

## 2015-01-31 DIAGNOSIS — Z8673 Personal history of transient ischemic attack (TIA), and cerebral infarction without residual deficits: Secondary | ICD-10-CM | POA: Diagnosis not present

## 2015-01-31 DIAGNOSIS — M5126 Other intervertebral disc displacement, lumbar region: Secondary | ICD-10-CM | POA: Diagnosis not present

## 2015-01-31 DIAGNOSIS — Z720 Tobacco use: Secondary | ICD-10-CM | POA: Diagnosis not present

## 2015-01-31 DIAGNOSIS — M25511 Pain in right shoulder: Secondary | ICD-10-CM | POA: Diagnosis not present

## 2015-01-31 DIAGNOSIS — M545 Low back pain: Secondary | ICD-10-CM | POA: Diagnosis not present

## 2015-01-31 DIAGNOSIS — I2510 Atherosclerotic heart disease of native coronary artery without angina pectoris: Secondary | ICD-10-CM | POA: Diagnosis not present

## 2015-01-31 DIAGNOSIS — F419 Anxiety disorder, unspecified: Secondary | ICD-10-CM | POA: Diagnosis not present

## 2015-01-31 DIAGNOSIS — Z950 Presence of cardiac pacemaker: Secondary | ICD-10-CM | POA: Diagnosis not present

## 2015-02-04 DIAGNOSIS — F419 Anxiety disorder, unspecified: Secondary | ICD-10-CM | POA: Diagnosis not present

## 2015-02-04 DIAGNOSIS — M545 Low back pain: Secondary | ICD-10-CM | POA: Diagnosis not present

## 2015-02-04 DIAGNOSIS — I2510 Atherosclerotic heart disease of native coronary artery without angina pectoris: Secondary | ICD-10-CM | POA: Diagnosis not present

## 2015-02-04 DIAGNOSIS — Z950 Presence of cardiac pacemaker: Secondary | ICD-10-CM | POA: Diagnosis not present

## 2015-02-04 DIAGNOSIS — Z720 Tobacco use: Secondary | ICD-10-CM | POA: Diagnosis not present

## 2015-02-04 DIAGNOSIS — Z8673 Personal history of transient ischemic attack (TIA), and cerebral infarction without residual deficits: Secondary | ICD-10-CM | POA: Diagnosis not present

## 2015-02-04 DIAGNOSIS — M5126 Other intervertebral disc displacement, lumbar region: Secondary | ICD-10-CM | POA: Diagnosis not present

## 2015-02-04 DIAGNOSIS — I10 Essential (primary) hypertension: Secondary | ICD-10-CM | POA: Diagnosis not present

## 2015-02-04 DIAGNOSIS — M25511 Pain in right shoulder: Secondary | ICD-10-CM | POA: Diagnosis not present

## 2015-02-04 DIAGNOSIS — E1151 Type 2 diabetes mellitus with diabetic peripheral angiopathy without gangrene: Secondary | ICD-10-CM | POA: Diagnosis not present

## 2015-02-06 DIAGNOSIS — N390 Urinary tract infection, site not specified: Secondary | ICD-10-CM

## 2015-02-06 HISTORY — DX: Urinary tract infection, site not specified: N39.0

## 2015-02-07 DIAGNOSIS — I10 Essential (primary) hypertension: Secondary | ICD-10-CM | POA: Diagnosis not present

## 2015-02-07 DIAGNOSIS — Z950 Presence of cardiac pacemaker: Secondary | ICD-10-CM | POA: Diagnosis not present

## 2015-02-07 DIAGNOSIS — M5126 Other intervertebral disc displacement, lumbar region: Secondary | ICD-10-CM | POA: Diagnosis not present

## 2015-02-07 DIAGNOSIS — M25511 Pain in right shoulder: Secondary | ICD-10-CM | POA: Diagnosis not present

## 2015-02-07 DIAGNOSIS — F419 Anxiety disorder, unspecified: Secondary | ICD-10-CM | POA: Diagnosis not present

## 2015-02-07 DIAGNOSIS — Z8673 Personal history of transient ischemic attack (TIA), and cerebral infarction without residual deficits: Secondary | ICD-10-CM | POA: Diagnosis not present

## 2015-02-07 DIAGNOSIS — E1151 Type 2 diabetes mellitus with diabetic peripheral angiopathy without gangrene: Secondary | ICD-10-CM | POA: Diagnosis not present

## 2015-02-07 DIAGNOSIS — Z720 Tobacco use: Secondary | ICD-10-CM | POA: Diagnosis not present

## 2015-02-07 DIAGNOSIS — I2510 Atherosclerotic heart disease of native coronary artery without angina pectoris: Secondary | ICD-10-CM | POA: Diagnosis not present

## 2015-02-07 DIAGNOSIS — M545 Low back pain: Secondary | ICD-10-CM | POA: Diagnosis not present

## 2015-02-08 DIAGNOSIS — M5126 Other intervertebral disc displacement, lumbar region: Secondary | ICD-10-CM | POA: Diagnosis not present

## 2015-02-08 DIAGNOSIS — I2510 Atherosclerotic heart disease of native coronary artery without angina pectoris: Secondary | ICD-10-CM | POA: Diagnosis not present

## 2015-02-08 DIAGNOSIS — M545 Low back pain: Secondary | ICD-10-CM | POA: Diagnosis not present

## 2015-02-08 DIAGNOSIS — M25511 Pain in right shoulder: Secondary | ICD-10-CM | POA: Diagnosis not present

## 2015-02-11 DIAGNOSIS — M25511 Pain in right shoulder: Secondary | ICD-10-CM | POA: Diagnosis not present

## 2015-02-11 DIAGNOSIS — I2510 Atherosclerotic heart disease of native coronary artery without angina pectoris: Secondary | ICD-10-CM | POA: Diagnosis not present

## 2015-02-11 DIAGNOSIS — M545 Low back pain: Secondary | ICD-10-CM | POA: Diagnosis not present

## 2015-02-11 DIAGNOSIS — M5126 Other intervertebral disc displacement, lumbar region: Secondary | ICD-10-CM | POA: Diagnosis not present

## 2015-02-11 DIAGNOSIS — Z720 Tobacco use: Secondary | ICD-10-CM | POA: Diagnosis not present

## 2015-02-11 DIAGNOSIS — F419 Anxiety disorder, unspecified: Secondary | ICD-10-CM | POA: Diagnosis not present

## 2015-02-11 DIAGNOSIS — Z950 Presence of cardiac pacemaker: Secondary | ICD-10-CM | POA: Diagnosis not present

## 2015-02-11 DIAGNOSIS — Z8673 Personal history of transient ischemic attack (TIA), and cerebral infarction without residual deficits: Secondary | ICD-10-CM | POA: Diagnosis not present

## 2015-02-11 DIAGNOSIS — E1151 Type 2 diabetes mellitus with diabetic peripheral angiopathy without gangrene: Secondary | ICD-10-CM | POA: Diagnosis not present

## 2015-02-11 DIAGNOSIS — I10 Essential (primary) hypertension: Secondary | ICD-10-CM | POA: Diagnosis not present

## 2015-02-13 DIAGNOSIS — M545 Low back pain: Secondary | ICD-10-CM | POA: Diagnosis not present

## 2015-02-13 DIAGNOSIS — M25511 Pain in right shoulder: Secondary | ICD-10-CM | POA: Diagnosis not present

## 2015-02-13 DIAGNOSIS — F419 Anxiety disorder, unspecified: Secondary | ICD-10-CM | POA: Diagnosis not present

## 2015-02-13 DIAGNOSIS — Z720 Tobacco use: Secondary | ICD-10-CM | POA: Diagnosis not present

## 2015-02-13 DIAGNOSIS — I2510 Atherosclerotic heart disease of native coronary artery without angina pectoris: Secondary | ICD-10-CM | POA: Diagnosis not present

## 2015-02-13 DIAGNOSIS — Z8673 Personal history of transient ischemic attack (TIA), and cerebral infarction without residual deficits: Secondary | ICD-10-CM | POA: Diagnosis not present

## 2015-02-13 DIAGNOSIS — Z7901 Long term (current) use of anticoagulants: Secondary | ICD-10-CM | POA: Diagnosis not present

## 2015-02-13 DIAGNOSIS — I10 Essential (primary) hypertension: Secondary | ICD-10-CM | POA: Diagnosis not present

## 2015-02-13 DIAGNOSIS — M5126 Other intervertebral disc displacement, lumbar region: Secondary | ICD-10-CM | POA: Diagnosis not present

## 2015-02-13 DIAGNOSIS — Z950 Presence of cardiac pacemaker: Secondary | ICD-10-CM | POA: Diagnosis not present

## 2015-02-13 DIAGNOSIS — E1151 Type 2 diabetes mellitus with diabetic peripheral angiopathy without gangrene: Secondary | ICD-10-CM | POA: Diagnosis not present

## 2015-02-15 ENCOUNTER — Emergency Department (HOSPITAL_COMMUNITY): Payer: Medicare Other

## 2015-02-15 ENCOUNTER — Encounter (HOSPITAL_COMMUNITY): Payer: Self-pay | Admitting: Nurse Practitioner

## 2015-02-15 ENCOUNTER — Emergency Department (HOSPITAL_COMMUNITY)
Admission: EM | Admit: 2015-02-15 | Discharge: 2015-02-15 | Disposition: A | Discharge status: Home / Self Care | Payer: Medicare Other | Attending: Emergency Medicine | Admitting: Emergency Medicine

## 2015-02-15 DIAGNOSIS — R0602 Shortness of breath: Secondary | ICD-10-CM | POA: Diagnosis not present

## 2015-02-15 DIAGNOSIS — F17210 Nicotine dependence, cigarettes, uncomplicated: Secondary | ICD-10-CM | POA: Insufficient documentation

## 2015-02-15 DIAGNOSIS — Z7902 Long term (current) use of antithrombotics/antiplatelets: Secondary | ICD-10-CM | POA: Insufficient documentation

## 2015-02-15 DIAGNOSIS — E119 Type 2 diabetes mellitus without complications: Secondary | ICD-10-CM | POA: Diagnosis not present

## 2015-02-15 DIAGNOSIS — Z7982 Long term (current) use of aspirin: Secondary | ICD-10-CM | POA: Insufficient documentation

## 2015-02-15 DIAGNOSIS — I10 Essential (primary) hypertension: Secondary | ICD-10-CM | POA: Diagnosis not present

## 2015-02-15 DIAGNOSIS — G8929 Other chronic pain: Secondary | ICD-10-CM | POA: Diagnosis not present

## 2015-02-15 DIAGNOSIS — Z8673 Personal history of transient ischemic attack (TIA), and cerebral infarction without residual deficits: Secondary | ICD-10-CM | POA: Insufficient documentation

## 2015-02-15 DIAGNOSIS — I2510 Atherosclerotic heart disease of native coronary artery without angina pectoris: Secondary | ICD-10-CM | POA: Insufficient documentation

## 2015-02-15 DIAGNOSIS — E872 Acidosis, unspecified: Secondary | ICD-10-CM

## 2015-02-15 DIAGNOSIS — M1990 Unspecified osteoarthritis, unspecified site: Secondary | ICD-10-CM | POA: Diagnosis not present

## 2015-02-15 DIAGNOSIS — R05 Cough: Secondary | ICD-10-CM | POA: Diagnosis present

## 2015-02-15 DIAGNOSIS — Z8701 Personal history of pneumonia (recurrent): Secondary | ICD-10-CM | POA: Diagnosis not present

## 2015-02-15 DIAGNOSIS — Z8742 Personal history of other diseases of the female genital tract: Secondary | ICD-10-CM | POA: Insufficient documentation

## 2015-02-15 DIAGNOSIS — I739 Peripheral vascular disease, unspecified: Secondary | ICD-10-CM | POA: Insufficient documentation

## 2015-02-15 DIAGNOSIS — J069 Acute upper respiratory infection, unspecified: Secondary | ICD-10-CM | POA: Insufficient documentation

## 2015-02-15 DIAGNOSIS — F419 Anxiety disorder, unspecified: Secondary | ICD-10-CM | POA: Diagnosis not present

## 2015-02-15 DIAGNOSIS — H269 Unspecified cataract: Secondary | ICD-10-CM | POA: Diagnosis not present

## 2015-02-15 DIAGNOSIS — Z950 Presence of cardiac pacemaker: Secondary | ICD-10-CM | POA: Diagnosis not present

## 2015-02-15 DIAGNOSIS — Z794 Long term (current) use of insulin: Secondary | ICD-10-CM | POA: Diagnosis not present

## 2015-02-15 DIAGNOSIS — R531 Weakness: Secondary | ICD-10-CM | POA: Diagnosis not present

## 2015-02-15 DIAGNOSIS — G43909 Migraine, unspecified, not intractable, without status migrainosus: Secondary | ICD-10-CM | POA: Diagnosis not present

## 2015-02-15 DIAGNOSIS — E860 Dehydration: Secondary | ICD-10-CM | POA: Insufficient documentation

## 2015-02-15 LAB — HEPATIC FUNCTION PANEL
ALBUMIN: 3.6 g/dL (ref 3.5–5.0)
ALT: 30 U/L (ref 14–54)
AST: 29 U/L (ref 15–41)
Alkaline Phosphatase: 68 U/L (ref 38–126)
Bilirubin, Direct: 0.1 mg/dL (ref 0.1–0.5)
Indirect Bilirubin: 0.8 mg/dL (ref 0.3–0.9)
TOTAL PROTEIN: 7.3 g/dL (ref 6.5–8.1)
Total Bilirubin: 0.9 mg/dL (ref 0.3–1.2)

## 2015-02-15 LAB — BASIC METABOLIC PANEL
Anion gap: 14 (ref 5–15)
BUN: 56 mg/dL — AB (ref 6–20)
CHLORIDE: 107 mmol/L (ref 101–111)
CO2: 14 mmol/L — AB (ref 22–32)
CREATININE: 1.82 mg/dL — AB (ref 0.44–1.00)
Calcium: 8.6 mg/dL — ABNORMAL LOW (ref 8.9–10.3)
GFR calc Af Amer: 29 mL/min — ABNORMAL LOW (ref >60)
GFR calc non Af Amer: 25 mL/min — ABNORMAL LOW (ref >60)
Glucose, Bld: 197 mg/dL — ABNORMAL HIGH (ref 65–99)
Potassium: 4.5 mmol/L (ref 3.5–5.1)
Sodium: 135 mmol/L (ref 135–145)

## 2015-02-15 LAB — URINE MICROSCOPIC-ADD ON
BACTERIA UA: NONE SEEN
RBC / HPF: NONE SEEN RBC/hpf (ref 0–5)

## 2015-02-15 LAB — DIFFERENTIAL
BASOS ABS: 0 10*3/uL (ref 0.0–0.1)
BASOS PCT: 0 %
Eosinophils Absolute: 0 10*3/uL (ref 0.0–0.7)
Eosinophils Relative: 0 %
LYMPHS PCT: 20 %
Lymphs Abs: 1 10*3/uL (ref 0.7–4.0)
Monocytes Absolute: 0.3 10*3/uL (ref 0.1–1.0)
Monocytes Relative: 5 %
NEUTROS ABS: 3.8 10*3/uL (ref 1.7–7.7)
NEUTROS PCT: 75 %

## 2015-02-15 LAB — URINALYSIS, ROUTINE W REFLEX MICROSCOPIC
Bilirubin Urine: NEGATIVE
Glucose, UA: NEGATIVE mg/dL
Hgb urine dipstick: NEGATIVE
Ketones, ur: NEGATIVE mg/dL
LEUKOCYTES UA: NEGATIVE
Nitrite: NEGATIVE
PROTEIN: 100 mg/dL — AB
SPECIFIC GRAVITY, URINE: 1.012 (ref 1.005–1.030)
pH: 5 (ref 5.0–8.0)

## 2015-02-15 LAB — CBC
HCT: 41.3 % (ref 36.0–46.0)
Hemoglobin: 13.6 g/dL (ref 12.0–15.0)
MCH: 33 pg (ref 26.0–34.0)
MCHC: 32.9 g/dL (ref 30.0–36.0)
MCV: 100.2 fL — AB (ref 78.0–100.0)
PLATELETS: 178 10*3/uL (ref 150–400)
RBC: 4.12 MIL/uL (ref 3.87–5.11)
RDW: 13.3 % (ref 11.5–15.5)
WBC: 5.3 10*3/uL (ref 4.0–10.5)

## 2015-02-15 LAB — I-STAT TROPONIN, ED: TROPONIN I, POC: 0.01 ng/mL (ref 0.00–0.08)

## 2015-02-15 LAB — PROTIME-INR
INR: 1.11 (ref 0.00–1.49)
PROTHROMBIN TIME: 14.5 s (ref 11.6–15.2)

## 2015-02-15 LAB — APTT: aPTT: 32 seconds (ref 24–37)

## 2015-02-15 MED ORDER — DOXYCYCLINE HYCLATE 100 MG PO CAPS: 100.0000 mg | ORAL_CAPSULE | Freq: Two times a day (BID) | ORAL | Status: DC

## 2015-02-15 MED ORDER — GUAIFENESIN ER 600 MG PO TB12: 600.0000 mg | ORAL_TABLET | Freq: Two times a day (BID) | ORAL | Status: DC | PRN

## 2015-02-15 MED ORDER — ACETAMINOPHEN 325 MG PO TABS
325.0000 mg | ORAL_TABLET | Freq: Once | ORAL | Status: AC
  Administered 2015-02-15: 325 mg via ORAL
  Filled 2015-02-15: qty 1

## 2015-02-15 MED ORDER — ACETAMINOPHEN 325 MG PO TABS
650.0000 mg | ORAL_TABLET | Freq: Once | ORAL | Status: AC
  Administered 2015-02-15: 650 mg via ORAL
  Filled 2015-02-15: qty 2

## 2015-02-15 MED ORDER — SODIUM CHLORIDE 0.9 % IV BOLUS (SEPSIS)
15.0000 mL/kg | Freq: Once | INTRAVENOUS | Status: AC
  Administered 2015-02-15: 692 mL via INTRAVENOUS

## 2015-02-15 NOTE — ED Notes (Signed)
Pt. Left with all belongings. Discharge instructions were reviewed and all questions were answered.  

## 2015-02-15 NOTE — ED Notes (Signed)
The pt has had weakness for several days.  She has had a stroke on the right in the past  Her rt arm is  Difficult for her to straighten out and she has pain with movement there

## 2015-02-15 NOTE — ED Notes (Addendum)
Pt taken to xray before i saw her

## 2015-02-15 NOTE — Discharge Instructions (Signed)
Dehydration, Adult Dehydration means your body does not have as much fluid or water as it needs. It happens when you take in less fluid than you lose. Your kidneys, brain, and heart will not work properly without the right amount of fluids.  Dehydration can range from mild to severe. It should be treated right away to help prevent it from becoming severe. HOME CARE  Drink enough fluid to keep your pee (urine) clear or pale yellow.  Drink water or fluid slowly by taking small sips. You can also try sucking on ice cubes.  Have food or drinks that contain electrolytes. Examples include bananas and sports drinks.  Take over-the-counter and prescription medicines only as told by your doctor.  Prepare oral rehydration solution (ORS) according to the instructions that came with it. Take sips of ORS every 5 minutes until your pee returns to normal.  If you are throwing up (vomiting) or have watery poop (diarrhea), keep trying to drink water, ORS, or both.  If you have watery poop, avoid:  Drinks with caffeine.  Fruit juice.  Milk.  Carbonated soft drinks.  Do not take salt tablets. This can lead to having too much sodium in your body (hypernatremia). GET HELP IF:  You cannot eat or drink without throwing up.  You have had mild watery poop for longer than 24 hours.  You have a fever. GET HELP RIGHT AWAY IF:   You have very strong thirst.  You have very bad watery poop.  You have not peed in 6-8 hours, or you have peed only a small amount of very dark pee.  You have shriveled skin.  You are dizzy, confused, or both.   This information is not intended to replace advice given to you by your health care provider. Make sure you discuss any questions you have with your health care provider.   Document Released: 12/19/2008 Document Revised: 11/13/2014 Document Reviewed: 07/10/2014 Elsevier Interactive Patient Education 2016 Else[employer]  Upper Respiratory Infection, Adult Most  upper respiratory infections (URIs) are a viral infection of the air passages leading to the lungs. A URI affects the nose, throat, and upper air passages. The most common type of URI is nasopharyngitis and is typically referred to as "the common cold." URIs run their course and usually go away on their own. Most of the time, a URI does not require medical attention, but sometimes a bacterial infection in the upper airways can follow a viral infection. This is called a secondary infection. Sinus and middle ear infections are common types of secondary upper respiratory infections. Bacterial pneumonia can also complicate a URI. A URI can worsen asthma and chronic obstructive pulmonary disease (COPD). Sometimes, these complications can require emergency medical care and may be life threatening.  CAUSES Almost all URIs are caused by viruses. A virus is a type of germ and can spread from one person to another.  RISKS FACTORS You may be at risk for a URI if:   You smoke.   You have chronic heart or lung disease.  You have a weakened defense (immune) system.   You are very young or very old.   You have nasal allergies or asthma.  You work in crowded or poorly ventilated areas.  You work in health care facilities or schools. SIGNS AND SYMPTOMS  Symptoms typically develop 2-3 days after you come in contact with a cold virus. Most viral URIs last 7-10 days. However, viral URIs from the influenza virus (flu virus) can last  14-18 days and are typically more severe. Symptoms may include:   Runny or stuffy (congested) nose.   Sneezing.   Cough.   Sore throat.   Headache.   Fatigue.   Fever.   Loss of appetite.   Pain in your forehead, behind your eyes, and over your cheekbones (sinus pain).  Muscle aches.  DIAGNOSIS  Your health care provider may diagnose a URI by:  Physical exam.  Tests to check that your symptoms are not due to another condition such as:  Strep  throat.  Sinusitis.  Pneumonia.  Asthma. TREATMENT  A URI goes away on its own with time. It cannot be cured with medicines, but medicines may be prescribed or recommended to relieve symptoms. Medicines may help:  Reduce your fever.  Reduce your cough.  Relieve nasal congestion. HOME CARE INSTRUCTIONS   Take medicines only as directed by your health care provider.   Gargle warm saltwater or take cough drops to comfort your throat as directed by your health care provider.  Use a warm mist humidifier or inhale steam from a shower to increase air moisture. This may make it easier to breathe.  Drink enough fluid to keep your urine clear or pale yellow.   Eat soups and other clear broths and maintain good nutrition.   Rest as needed.   Return to work when your temperature has returned to normal or as your health care provider advises. You may need to stay home longer to avoid infecting others. You can also use a face mask and careful hand washing to prevent spread of the virus.  Increase the usage of your inhaler if you have asthma.   Do not use any tobacco products, including cigarettes, chewing tobacco, or electronic cigarettes. If you need help quitting, ask your health care provider. PREVENTION  The best way to protect yourself from getting a cold is to practice good hygiene.   Avoid oral or hand contact with people with cold symptoms.   Wash your hands often if contact occurs.  There is no clear evidence that vitamin C, vitamin E, echinacea, or exercise reduces the chance of developing a cold. However, it is always recommended to get plenty of rest, exercise, and practice good nutrition.  SEEK MEDICAL CARE IF:   You are getting worse rather than better.   Your symptoms are not controlled by medicine.   You have chills.  You have worsening shortness of breath.  You have brown or red mucus.  You have yellow or brown nasal discharge.  You have pain in your  face, especially when you bend forward.  You have a fever.  You have swollen neck glands.  You have pain while swallowing.  You have white areas in the back of your throat. SEEK IMMEDIATE MEDICAL CARE IF:   You have severe or persistent:  Headache.  Ear pain.  Sinus pain.  Chest pain.  You have chronic lung disease and any of the following:  Wheezing.  Prolonged cough.  Coughing up blood.  A change in your usual mucus.  You have a stiff neck.  You have changes in your:  Vision.  Hearing.  Thinking.  Mood. MAKE SURE YOU:   Understand these instructions.  Will watch your condition.  Will get help right away if you are not doing well or get worse.   This information is not intended to replace advice given to you by your health care provider. Make sure you discuss any questions you have  with your health care provider.   Document Released: 08/18/2000 Document Revised: 07/09/2014 Document Reviewed: 05/30/2013 Elsevier Interactive Patient Education [employer].

## 2015-02-15 NOTE — ED Provider Notes (Signed)
CSN: [id number]     Arrival date & time 02/15/15  1513 History   First MD Initiated Contact with Patient 02/15/15 1545     Chief Complaint  Patient presents with  . Cough  . Weakness  . Constipation   HPI Pt started having trouble with generalized weakness and congestion over the last few days.  Daughter who doesn't live with her went to the house today to help her bathe etc. Noticed she was weak and listless.  Not clear if she has been coughing a lot. She has been having pain on the right side of her chest.  It started a few days ago.  It just feels sore.  Her eyes are watery.  She has not been eating well.  Not sure when her last bowel movement.  She has decreased strength but she is able to get up and walk on her own. Past Medical History  Diagnosis Date  . Hypertension   . Coronary artery disease   . Renal disorder   . Herniated lumbar intervertebral disc   . Cataract     "both eyes; blood pressure's always too high to have them fixed" (09/11/2014)  . Renovascular hypertension      s/p left renal artery stent 12/2007.  S/P balloon angioplasty on 02/16/10 for ISR, BP was  controlled well since then.     . Claudication in peripheral vascular disease ([place])     02/16/10: Left CIA 9.0x28 Omnilink and REIA 8.0x40 seff expanding Zilver.   right subclavian artery stent 03/18/2008,  . Peripheral vascular disease ([place])   . Anxiety   . Pacemaker   . Pneumonia X 2  . IDDM (insulin dependent diabetes mellitus) ([place])   . Migraine     "used to have terrible migraines; stopped in the 1990's"  . Stroke [facility]) 1999    Stroke and TIA in 1999 with right sided weakness, now with residual right arm weakness (09/11/2014)  . Arthritis     "everywhere"  . Chronic lower back pain    Past Surgical History  Procedure Laterality Date  . Appendectomy    . Ureteral stent placement    . Carotid endarterectomy Left 1991     Stroke and TIA in 1999 with right sided weakness, now with residual right arm weakness   . Lumbar laminectomy      'herniated disc"  . Back surgery    . Colonoscopy  07/30/2011    Procedure: COLONOSCOPY;  Surgeon: Inda Castle, MD;  Location: [facility];  Service: Endoscopy;  Laterality: N/A;  gi bleed  . Esophagogastroduodenoscopy  07/30/2011    Procedure: ESOPHAGOGASTRODUODENOSCOPY (EGD);  Surgeon: Inda Castle, MD;  Location: [facility];  Service: Endoscopy;  Laterality: N/A;  . Permanent pacemaker insertion Left 06/28/2011    Procedure: PERMANENT PACEMAKER INSERTION;  Surgeon: Deboraha Sprang, MD;  Location: [facility] CATH LAB;  Service: Cardiovascular;  Laterality: Left;  . Abdominal angiogram N/A 07/06/2011    Procedure: ABDOMINAL ANGIOGRAM;  Surgeon: Laverda Page, MD;  Location: [facility] CATH LAB;  Service: Cardiovascular;  Laterality: N/A;  . Renal angiogram N/A 07/06/2011    Procedure: RENAL ANGIOGRAM;  Surgeon: Laverda Page, MD;  Location: [facility] CATH LAB;  Service: Cardiovascular;  Laterality: N/A;  . Lower extremity angiogram Right 05/29/2012    Procedure: LOWER EXTREMITY ANGIOGRAM;  Surgeon: Laverda Page, MD;  Location: [facility] CATH LAB;  Service: Cardiovascular;  Laterality: Right;  . Tonsillectomy    . Insert / replace / remove  pacemaker    . Excisional hemorrhoidectomy    . Carpal tunnel release Left   . Vaginal hysterectomy    . Peripheral vascular catheterization N/A 11/05/2014    Procedure: Abdominal Aortogram;  Surgeon: Serafina Mitchell, MD;  Location: [facility] CV LAB;  Service: Cardiovascular;  Laterality: N/A;   Family History  Problem Relation Age of Onset  . Cancer Father   . Heart attack Mother    Social History  Substance Use Topics  . Smoking status: Current Every Day Smoker -- 0.00 packs/day for 70 years    Types: Cigarettes  . Smokeless tobacco: Never Used     Comment: 09/11/2014 "I used to smoke very heavy; down to 1 cigarette/day"  . Alcohol Use: Yes     Comment: "stopped drinking back in the 1970's"   OB History    No data available      Review of Systems  All other systems reviewed and are negative.     Allergies  Norvasc and Peanut-containing drug products  Home Medications   Prior to Admission medications   Medication Sig Start Date End Date Taking? Authorizing Provider  aspirin EC 81 MG tablet Take 81 mg by mouth daily.   Yes Historical Provider, MD  benazepril (LOTENSIN) 40 MG tablet Take 40 mg by mouth daily. 01/28/15  Yes Historical Provider, MD  clopidogrel (PLAVIX) 75 MG tablet Take 75 mg by mouth daily.   Yes Historical Provider, MD  furosemide (LASIX) 20 MG tablet Take 20 mg by mouth 2 (two) times daily. 01/28/15  Yes Historical Provider, MD  hydrALAZINE (APRESOLINE) 100 MG tablet Take 1 tablet (100 mg total) by mouth 4 (four) times daily. 01/16/15  Yes Belkys A Regalado, MD  insulin NPH-regular Human (NOVOLIN 70/30) (70-30) 100 UNIT/ML injection Inject 8 Units into the skin 2 (two) times daily as needed (CBG >100).    Yes Historical Provider, MD  isosorbide mononitrate (IMDUR) 120 MG 24 hr tablet Take 1 tablet (120 mg total) by mouth daily. 12/12/12  Yes Annita Brod, MD  linagliptin (TRADJENTA) 5 MG TABS tablet Take 1 tablet (5 mg total) by mouth daily. 12/01/14  Yes Lauree Chandler, NP  lisinopril (PRINIVIL,ZESTRIL) 10 MG tablet Take 10 mg by mouth daily. 02/04/15  Yes Historical Provider, MD  metoprolol tartrate (LOPRESSOR) 25 MG tablet Take 25 mg by mouth 2 (two) times daily.   Yes Historical Provider, MD  Nutritional Supplements (ENSURE PO) Take 237 mLs by mouth daily.   Yes Historical Provider, MD  Omega-3 Fatty Acids (FISH OIL) 1000 MG CAPS Take 1,000 mg by mouth 2 (two) times daily.    Yes Historical Provider, MD  pantoprazole (PROTONIX) 40 MG tablet Take 1 tablet (40 mg total) by mouth daily. 01/16/15  Yes Belkys A Regalado, MD  potassium chloride (K-DUR) 10 MEQ tablet Take 20 mEq by mouth daily. Take with food 01/28/15  Yes Historical Provider, MD  acetaminophen-codeine (TYLENOL #4) 300-60 MG  tablet Take 1 tablet by mouth every 6 (six) hours as needed for moderate pain. Patient not taking: Reported on 02/15/2015 12/06/14   Jola Schmidt, MD  diazepam (VALIUM) 5 MG tablet Take 1 tablet (5 mg total) by mouth 2 (two) times daily. Patient not taking: Reported on 02/15/2015 11/07/14   Velvet Bathe, MD  doxycycline (VIBRAMYCIN) 100 MG capsule Take 1 capsule (100 mg total) by mouth 2 (two) times daily. 02/15/15   Dorie Rank, MD  feeding supplement, GLUCERNA SHAKE, (GLUCERNA SHAKE) LIQD Take 237 mLs  by mouth 2 (two) times daily between meals. Patient not taking: Reported on 02/15/2015 01/16/15   Belkys A Regalado, MD  guaiFENesin (MUCINEX) 600 MG 12 hr tablet Take 1 tablet (600 mg total) by mouth 2 (two) times daily as needed. 02/15/15   Dorie Rank, MD  tamsulosin (FLOMAX) 0.4 MG CAPS capsule Take 1 capsule (0.4 mg total) by mouth daily after supper. Patient not taking: Reported on 02/15/2015 11/07/14   Velvet Bathe, MD   BP 132/52 mmHg  Pulse 67  Temp(Src) 98.5 F (36.9 C) (Oral)  Resp 15  Wt 46.1 kg  SpO2 96% Physical Exam  Constitutional: She appears listless. No distress.  Frail elderly, listless  HENT:  Head: Normocephalic and atraumatic.  Right Ear: External ear normal.  Left Ear: External ear normal.  Mouth/Throat: No oropharyngeal exudate.  Eyes: Conjunctivae are normal. Right eye exhibits no discharge. Left eye exhibits no discharge. No scleral icterus.  Neck: Neck supple. No tracheal deviation present.  Cardiovascular: Normal rate, regular rhythm and intact distal pulses.   Pulmonary/Chest: Effort normal and breath sounds normal. No stridor. No respiratory distress. She has no wheezes. She has no rales.  Abdominal: Soft. Bowel sounds are normal. She exhibits no distension. There is no tenderness. There is no rebound and no guarding.  Scaphoid   Musculoskeletal: She exhibits no edema or tenderness.  Neurological: She appears listless. No cranial nerve deficit (no facial droop,  extraocular movements intact, no slurred speech) or sensory deficit. She exhibits normal muscle tone. She displays no seizure activity. Coordination normal.  General weakness   Skin: Skin is warm and dry. No rash noted.  Decreased skin turgor   Psychiatric: She has a normal mood and affect.  Nursing note and vitals reviewed.   ED Course  Procedures (including critical care time) Labs Review Labs Reviewed  BASIC METABOLIC PANEL - Abnormal; Notable for the following:    CO2 14 (*)    Glucose, Bld 197 (*)    BUN 56 (*)    Creatinine, Ser 1.82 (*)    Calcium 8.6 (*)    GFR calc non Af Amer 25 (*)    GFR calc Af Amer 29 (*)    All other components within normal limits  CBC - Abnormal; Notable for the following:    MCV 100.2 (*)    All other components within normal limits  URINALYSIS, ROUTINE W REFLEX MICROSCOPIC (NOT AT [facility] - Abnormal; Notable for the following:    Protein, ur 100 (*)    All other components within normal limits  URINE MICROSCOPIC-ADD ON - Abnormal; Notable for the following:    Squamous Epithelial / LPF 0-5 (*)    All other components within normal limits  PROTIME-INR  APTT  DIFFERENTIAL  HEPATIC FUNCTION PANEL  I-STAT TROPOININ, ED  I-STAT TROPOININ, ED    Imaging Review Dg Chest 2 View  02/15/2015  CLINICAL DATA:  Left-sided chest pain and shortness of Breath for 3-4 weeks, history of tobacco use EXAM: CHEST - 2 VIEW COMPARISON:  01/13/2015 FINDINGS: Cardiac shadow is stable. The lungs are well aerated bilaterally. A pacing device is again seen and stable. No focal bony abnormality is seen. IMPRESSION: No acute abnormality noted. Electronically Signed   By: Inez Catalina M.D.   On: 02/15/2015 16:05   Ct Head Wo Contrast  02/15/2015  CLINICAL DATA:  81 year old with personal history of stroke, presenting with weakness over the past several days, with difficulty moving and straightening the right arm. EXAM:  CT HEAD WITHOUT CONTRAST TECHNIQUE: Contiguous  axial images were obtained from the base of the skull through the vertex without intravenous contrast. COMPARISON:  Multiple prior head CTs dating back to 04/11/2009, most recently 10/28/2014. FINDINGS: Moderate cortical and deep atrophy and mild cerebellar atrophy, stable dating back to 2013 but slightly progressive since 2011. Old lacunar strokes involving the left basal ganglia, unchanged. Asymmetric enlargement of the frontal horn of the left lateral ventricle, unchanged, due to encephalomalacia in the adjacent left frontal lobe at the site of a previously thrombosed AVM, with associated mild dystrophic calcification. Moderate changes of small vessel disease of the white matter diffusely, unchanged. No mass lesion. No midline shift. No acute hemorrhage or hematoma. No extra-axial fluid collections. No evidence of acute infarction. No skull fracture or other focal osseous abnormality involving the skull. Mucosal thickening involving the maxillary sinuses, opacification of multiple bilateral ethmoid air cells and mucosal thickening with air-fluid levels in the sphenoid sinuses bilaterally. Bilateral mastoid air cells and middle ear cavities well-aerated. Severe bilateral carotid siphon and left vertebral artery atherosclerosis. IMPRESSION: 1. No acute intracranial abnormality. 2. Stable moderate generalized atrophy, moderate chronic microvascular ischemic changes of the white matter, old lacunar strokes in the left basal ganglia, and encephalomalacia in the left frontal lobe white matter. 3. Chronic bilateral maxillary, ethmoid and sphenoid sinus disease with superimposed acute disease in the sphenoid sinuses. Electronically Signed   By: Evangeline Dakin M.D.   On: 02/15/2015 18:15   I have personally reviewed and evaluated these images and lab results as part of my medical decision-making.   EKG Interpretation   Date/Time:  Saturday February 15 2015 15:21:18 EST Ventricular Rate:  76 PR Interval:   160 QRS Duration: 84 QT Interval:  424 QTC Calculation: 477 R Axis:   105 Text Interpretation:  Normal sinus rhythm Left ventricular hypertrophy  with repolarization abnormality Lateral infarct , age undetermined  Abnormal ECG No significant change since last tracing Confirmed by Alida Greiner   MD-J, Kaj Vasil [id number]) on 02/15/2015 5:52:55 PM      MDM   Final diagnoses:  Dehydration  Metabolic acidosis  URI, acute    Patient's laboratory tests are consistent with dehydration on top of her chronic kidney disease and chronic metabolic acidosis.  Her BUN and creatinine are slightly elevated compared to previous values. Her bicarbonate is also lower. Chest x-ray does not show pneumonia. Urinalysis does not suggest urinary tract infection.  Sx may be viral but she does have a very junky cough.  Will rx doxycycline.  CT scan does not show any evidence of stroke. Chest x-ray without definite pneumonia.  She was treated with IV fluids in the emergency room. Her symptoms have improved significantly. She is more alert and awake. She has been eating and drinking.  Her daughter would prefer to bring her home. I think this is reasonable considering the improvement she has had with IV fluid hydration.  I will have her follow-up with her doctor next week. Patient return to the emergency room for worsening symptoms   Dorie Rank, MD 02/15/15 2147

## 2015-02-15 NOTE — ED Notes (Signed)
Pt daughter brought her today for congestion, weakness, constipation. Pt has been c/o pain under  R breast, decreased oral intake, and increasingly weak over past days. daughter was unabel to bathe pt today because pt was so weak. Daughter is concerned for pneumonia. Pt is alert, mild labored breathing at rest

## 2015-02-15 NOTE — ED Notes (Signed)
Pt had an in and out catheter placed washed with soap and water prior to the    catherization .  Dark colored concentrated urine with a foul small toward the end of the cath,  Obtained approx  125 urine.  To lab

## 2015-02-15 NOTE — ED Notes (Signed)
Pt more alert  She does not want to be cathed.  Dr Tomi Bamberger has agreed to let her sit on a bedside commode.

## 2015-02-15 NOTE — ED Notes (Signed)
Pain all over,  Hoarse  Cough she still smokes

## 2015-02-15 NOTE — ED Notes (Signed)
The pt was unable to  Void on bedside commode.  Right now she is vigorously eating crackers and soup brought [place] [employer] by her daughter

## 2015-02-17 DIAGNOSIS — I1310 Hypertensive heart and chronic kidney disease without heart failure, with stage 1 through stage 4 chronic kidney disease, or unspecified chronic kidney disease: Secondary | ICD-10-CM | POA: Diagnosis not present

## 2015-02-17 DIAGNOSIS — E860 Dehydration: Secondary | ICD-10-CM | POA: Diagnosis not present

## 2015-02-17 DIAGNOSIS — I70219 Atherosclerosis of native arteries of extremities with intermittent claudication, unspecified extremity: Secondary | ICD-10-CM | POA: Diagnosis not present

## 2015-02-17 DIAGNOSIS — N183 Chronic kidney disease, stage 3 (moderate): Secondary | ICD-10-CM | POA: Diagnosis not present

## 2015-02-18 DIAGNOSIS — E1151 Type 2 diabetes mellitus with diabetic peripheral angiopathy without gangrene: Secondary | ICD-10-CM | POA: Diagnosis not present

## 2015-02-18 DIAGNOSIS — Z7901 Long term (current) use of anticoagulants: Secondary | ICD-10-CM | POA: Diagnosis not present

## 2015-02-18 DIAGNOSIS — Z720 Tobacco use: Secondary | ICD-10-CM | POA: Diagnosis not present

## 2015-02-18 DIAGNOSIS — M25511 Pain in right shoulder: Secondary | ICD-10-CM | POA: Diagnosis not present

## 2015-02-18 DIAGNOSIS — I2510 Atherosclerotic heart disease of native coronary artery without angina pectoris: Secondary | ICD-10-CM | POA: Diagnosis not present

## 2015-02-18 DIAGNOSIS — M545 Low back pain: Secondary | ICD-10-CM | POA: Diagnosis not present

## 2015-02-18 DIAGNOSIS — I10 Essential (primary) hypertension: Secondary | ICD-10-CM | POA: Diagnosis not present

## 2015-02-18 DIAGNOSIS — M5126 Other intervertebral disc displacement, lumbar region: Secondary | ICD-10-CM | POA: Diagnosis not present

## 2015-02-18 DIAGNOSIS — Z950 Presence of cardiac pacemaker: Secondary | ICD-10-CM | POA: Diagnosis not present

## 2015-02-18 DIAGNOSIS — F419 Anxiety disorder, unspecified: Secondary | ICD-10-CM | POA: Diagnosis not present

## 2015-02-18 DIAGNOSIS — Z8673 Personal history of transient ischemic attack (TIA), and cerebral infarction without residual deficits: Secondary | ICD-10-CM | POA: Diagnosis not present

## 2015-02-21 DIAGNOSIS — Z950 Presence of cardiac pacemaker: Secondary | ICD-10-CM | POA: Diagnosis not present

## 2015-02-21 DIAGNOSIS — Z8673 Personal history of transient ischemic attack (TIA), and cerebral infarction without residual deficits: Secondary | ICD-10-CM | POA: Diagnosis not present

## 2015-02-21 DIAGNOSIS — Z7901 Long term (current) use of anticoagulants: Secondary | ICD-10-CM | POA: Diagnosis not present

## 2015-02-21 DIAGNOSIS — E1151 Type 2 diabetes mellitus with diabetic peripheral angiopathy without gangrene: Secondary | ICD-10-CM | POA: Diagnosis not present

## 2015-02-21 DIAGNOSIS — M545 Low back pain: Secondary | ICD-10-CM | POA: Diagnosis not present

## 2015-02-21 DIAGNOSIS — I2510 Atherosclerotic heart disease of native coronary artery without angina pectoris: Secondary | ICD-10-CM | POA: Diagnosis not present

## 2015-02-21 DIAGNOSIS — Z720 Tobacco use: Secondary | ICD-10-CM | POA: Diagnosis not present

## 2015-02-21 DIAGNOSIS — M5126 Other intervertebral disc displacement, lumbar region: Secondary | ICD-10-CM | POA: Diagnosis not present

## 2015-02-21 DIAGNOSIS — M25511 Pain in right shoulder: Secondary | ICD-10-CM | POA: Diagnosis not present

## 2015-02-21 DIAGNOSIS — F419 Anxiety disorder, unspecified: Secondary | ICD-10-CM | POA: Diagnosis not present

## 2015-02-21 DIAGNOSIS — I10 Essential (primary) hypertension: Secondary | ICD-10-CM | POA: Diagnosis not present

## 2015-02-22 ENCOUNTER — Emergency Department (HOSPITAL_COMMUNITY)
Admission: EM | Admit: 2015-02-22 | Discharge: 2015-02-22 | Disposition: A | Discharge status: Home / Self Care | Payer: Medicare Other | Attending: Emergency Medicine | Admitting: Emergency Medicine

## 2015-02-22 ENCOUNTER — Encounter (HOSPITAL_COMMUNITY): Payer: Self-pay | Admitting: Emergency Medicine

## 2015-02-22 ENCOUNTER — Emergency Department (HOSPITAL_COMMUNITY): Payer: Medicare Other

## 2015-02-22 DIAGNOSIS — R630 Anorexia: Secondary | ICD-10-CM | POA: Insufficient documentation

## 2015-02-22 DIAGNOSIS — M1990 Unspecified osteoarthritis, unspecified site: Secondary | ICD-10-CM | POA: Diagnosis not present

## 2015-02-22 DIAGNOSIS — R0981 Nasal congestion: Secondary | ICD-10-CM | POA: Insufficient documentation

## 2015-02-22 DIAGNOSIS — Z87448 Personal history of other diseases of urinary system: Secondary | ICD-10-CM | POA: Insufficient documentation

## 2015-02-22 DIAGNOSIS — I10 Essential (primary) hypertension: Secondary | ICD-10-CM | POA: Insufficient documentation

## 2015-02-22 DIAGNOSIS — G8929 Other chronic pain: Secondary | ICD-10-CM | POA: Diagnosis not present

## 2015-02-22 DIAGNOSIS — R404 Transient alteration of awareness: Secondary | ICD-10-CM | POA: Diagnosis not present

## 2015-02-22 DIAGNOSIS — Z8701 Personal history of pneumonia (recurrent): Secondary | ICD-10-CM | POA: Diagnosis not present

## 2015-02-22 DIAGNOSIS — Z950 Presence of cardiac pacemaker: Secondary | ICD-10-CM | POA: Diagnosis not present

## 2015-02-22 DIAGNOSIS — E119 Type 2 diabetes mellitus without complications: Secondary | ICD-10-CM | POA: Insufficient documentation

## 2015-02-22 DIAGNOSIS — G710 Muscular dystrophy: Secondary | ICD-10-CM | POA: Insufficient documentation

## 2015-02-22 DIAGNOSIS — R531 Weakness: Secondary | ICD-10-CM | POA: Insufficient documentation

## 2015-02-22 DIAGNOSIS — R05 Cough: Secondary | ICD-10-CM | POA: Diagnosis not present

## 2015-02-22 DIAGNOSIS — Z792 Long term (current) use of antibiotics: Secondary | ICD-10-CM | POA: Diagnosis not present

## 2015-02-22 DIAGNOSIS — R634 Abnormal weight loss: Secondary | ICD-10-CM | POA: Diagnosis not present

## 2015-02-22 DIAGNOSIS — F419 Anxiety disorder, unspecified: Secondary | ICD-10-CM | POA: Insufficient documentation

## 2015-02-22 DIAGNOSIS — I2510 Atherosclerotic heart disease of native coronary artery without angina pectoris: Secondary | ICD-10-CM | POA: Diagnosis not present

## 2015-02-22 DIAGNOSIS — F17210 Nicotine dependence, cigarettes, uncomplicated: Secondary | ICD-10-CM | POA: Diagnosis not present

## 2015-02-22 DIAGNOSIS — Z7982 Long term (current) use of aspirin: Secondary | ICD-10-CM | POA: Insufficient documentation

## 2015-02-22 DIAGNOSIS — Z7902 Long term (current) use of antithrombotics/antiplatelets: Secondary | ICD-10-CM | POA: Insufficient documentation

## 2015-02-22 DIAGNOSIS — Z79899 Other long term (current) drug therapy: Secondary | ICD-10-CM | POA: Diagnosis not present

## 2015-02-22 DIAGNOSIS — Z8673 Personal history of transient ischemic attack (TIA), and cerebral infarction without residual deficits: Secondary | ICD-10-CM | POA: Diagnosis not present

## 2015-02-22 DIAGNOSIS — Z7984 Long term (current) use of oral hypoglycemic drugs: Secondary | ICD-10-CM | POA: Diagnosis not present

## 2015-02-22 DIAGNOSIS — M6281 Muscle weakness (generalized): Secondary | ICD-10-CM | POA: Diagnosis not present

## 2015-02-22 LAB — URINE MICROSCOPIC-ADD ON

## 2015-02-22 LAB — COMPREHENSIVE METABOLIC PANEL
ALT: 22 U/L (ref 14–54)
ANION GAP: 9 (ref 5–15)
AST: 27 U/L (ref 15–41)
Albumin: 3.5 g/dL (ref 3.5–5.0)
Alkaline Phosphatase: 73 U/L (ref 38–126)
BILIRUBIN TOTAL: 0.7 mg/dL (ref 0.3–1.2)
BUN: 65 mg/dL — AB (ref 6–20)
CHLORIDE: 103 mmol/L (ref 101–111)
CO2: 20 mmol/L — ABNORMAL LOW (ref 22–32)
Calcium: 9.1 mg/dL (ref 8.9–10.3)
Creatinine, Ser: 1.74 mg/dL — ABNORMAL HIGH (ref 0.44–1.00)
GFR, EST AFRICAN AMERICAN: 31 mL/min — AB (ref >60)
GFR, EST NON AFRICAN AMERICAN: 27 mL/min — AB (ref >60)
Glucose, Bld: 242 mg/dL — ABNORMAL HIGH (ref 65–99)
POTASSIUM: 5 mmol/L (ref 3.5–5.1)
Sodium: 132 mmol/L — ABNORMAL LOW (ref 135–145)
TOTAL PROTEIN: 6.9 g/dL (ref 6.5–8.1)

## 2015-02-22 LAB — CBC WITH DIFFERENTIAL/PLATELET
BASOS ABS: 0 10*3/uL (ref 0.0–0.1)
BASOS PCT: 1 %
EOS ABS: 0 10*3/uL (ref 0.0–0.7)
Eosinophils Relative: 1 %
HEMATOCRIT: 42 % (ref 36.0–46.0)
Hemoglobin: 14.3 g/dL (ref 12.0–15.0)
Lymphocytes Relative: 37 %
Lymphs Abs: 1.4 10*3/uL (ref 0.7–4.0)
MCH: 32.6 pg (ref 26.0–34.0)
MCHC: 34 g/dL (ref 30.0–36.0)
MCV: 95.9 fL (ref 78.0–100.0)
MONO ABS: 0.3 10*3/uL (ref 0.1–1.0)
MONOS PCT: 8 %
NEUTROS ABS: 2 10*3/uL (ref 1.7–7.7)
NEUTROS PCT: 53 %
Platelets: 243 10*3/uL (ref 150–400)
RBC: 4.38 MIL/uL (ref 3.87–5.11)
RDW: 12.7 % (ref 11.5–15.5)
WBC: 3.7 10*3/uL — ABNORMAL LOW (ref 4.0–10.5)

## 2015-02-22 LAB — URINALYSIS, ROUTINE W REFLEX MICROSCOPIC
BILIRUBIN URINE: NEGATIVE
GLUCOSE, UA: NEGATIVE mg/dL
HGB URINE DIPSTICK: NEGATIVE
KETONES UR: NEGATIVE mg/dL
Leukocytes, UA: NEGATIVE
Nitrite: NEGATIVE
PH: 5 (ref 5.0–8.0)
PROTEIN: 30 mg/dL — AB
Specific Gravity, Urine: 1.011 (ref 1.005–1.030)

## 2015-02-22 LAB — I-STAT TROPONIN, ED: TROPONIN I, POC: 0.01 ng/mL (ref 0.00–0.08)

## 2015-02-22 LAB — I-STAT CG4 LACTIC ACID, ED: LACTIC ACID, VENOUS: 0.7 mmol/L (ref 0.5–2.0)

## 2015-02-22 MED ORDER — SODIUM CHLORIDE 0.9 % IV SOLN
Freq: Once | INTRAVENOUS | Status: AC
  Administered 2015-02-22: 18:00:00 via INTRAVENOUS

## 2015-02-22 NOTE — ED Notes (Signed)
Generalized weakness and "not feeling well"; seen last week for the same. No improvement.

## 2015-02-22 NOTE — ED Notes (Signed)
Patient transported to X-ray 

## 2015-02-22 NOTE — Discharge Instructions (Signed)
The exact cause of your decreased appetite and weakness is not entirely clear.  Your results today look improved from last time.  Try to make an effort to eat small, frequent meals.  You can try to supplement your diet with boost or ensure as well.  Follow up with your primary care physician for continued work up and evaluation.  Weakness Weakness is a lack of strength. It may be felt all over the body (generalized) or in one specific part of the body (focal). Some causes of weakness can be serious. You may need further medical evaluation, especially if you are elderly or you have a history of immunosuppression (such as chemotherapy or HIV), kidney disease, heart disease, or diabetes. CAUSES  Weakness can be caused by many different things, including:  Infection.  Physical exhaustion.  Internal bleeding or other blood loss that results in a lack of red blood cells (anemia).  Dehydration. This cause is more common in elderly people.  Side effects or electrolyte abnormalities from medicines, such as pain medicines or sedatives.  Emotional distress, anxiety, or depression.  Circulation problems, especially severe peripheral arterial disease.  Heart disease, such as rapid atrial fibrillation, bradycardia, or heart failure.  Nervous system disorders, such as Guillain-Barr syndrome, multiple sclerosis, or stroke. DIAGNOSIS  To find the cause of your weakness, your caregiver will take your history and perform a physical exam. Lab tests or X-rays may also be ordered, if needed. TREATMENT  Treatment of weakness depends on the cause of your symptoms and can vary greatly. HOME CARE INSTRUCTIONS   Rest as needed.  Eat a well-balanced diet.  Try to get some exercise every day.  Only take over-the-counter or prescription medicines as directed by your caregiver. SEEK MEDICAL CARE IF:   Your weakness seems to be getting worse or spreads to other parts of your body.  You develop new aches or  pains. SEEK IMMEDIATE MEDICAL CARE IF:   You cannot perform your normal daily activities, such as getting dressed and feeding yourself.  You cannot walk up and down stairs, or you feel exhausted when you do so.  You have shortness of breath or chest pain.  You have difficulty moving parts of your body.  You have weakness in only one area of the body or on only one side of the body.  You have a fever.  You have trouble speaking or swallowing.  You cannot control your bladder or bowel movements.  You have black or bloody vomit or stools. MAKE SURE YOU:  Understand these instructions.  Will watch your condition.  Will get help right away if you are not doing well or get worse.   This information is not intended to replace advice given to you by your health care provider. Make sure you discuss any questions you have with your health care provider.   Document Released: 02/22/2005 Document Revised: 08/24/2011 Document Reviewed: 04/23/2011 Elsevier Interactive Patient Education [employer].

## 2015-02-22 NOTE — ED Notes (Signed)
Pt verbalized understanding of d/c instructions and has no further questions. Pt stable and NAD.  

## 2015-02-22 NOTE — ED Notes (Addendum)
[employer] interrogated and received message from the interrogation.  Message given to edp

## 2015-02-22 NOTE — ED Provider Notes (Signed)
CSN: [id number]     Arrival date & time 02/22/15  1739 History   First MD Initiated Contact with Patient 02/22/15 1742     Chief Complaint  Patient presents with  . Weakness     (Consider location/radiation/quality/duration/timing/severity/associated sxs/prior Treatment) HPI Comments: 81 y.o. Female with history of DM, peripheral neuropathy, CVA, HTN, dyslipidemia, CKD presents with her family for generalized weakness, weight loss, and decreased PO intake.  The patient reports that she just does not feel up to eating like she knows she should.  Denies fever, chills.  She has had chest congestion and some cough.  Patient has a home care nurse who visits and noted that she had lost 6 lbs in the last few days.     Past Medical History  Diagnosis Date  . Hypertension   . Coronary artery disease   . Renal disorder   . Herniated lumbar intervertebral disc   . Cataract     "both eyes; blood pressure's always too high to have them fixed" (09/11/2014)  . Renovascular hypertension      s/p left renal artery stent 12/2007.  S/P balloon angioplasty on 02/16/10 for ISR, BP was  controlled well since then.     . Claudication in peripheral vascular disease ([place])     02/16/10: Left CIA 9.0x28 Omnilink and REIA 8.0x40 seff expanding Zilver.   right subclavian artery stent 03/18/2008,  . Peripheral vascular disease ([place])   . Anxiety   . Pacemaker   . Pneumonia X 2  . IDDM (insulin dependent diabetes mellitus) ([place])   . Migraine     "used to have terrible migraines; stopped in the 1990's"  . Stroke [facility]) 1999    Stroke and TIA in 1999 with right sided weakness, now with residual right arm weakness (09/11/2014)  . Arthritis     "everywhere"  . Chronic lower back pain    Past Surgical History  Procedure Laterality Date  . Appendectomy    . Ureteral stent placement    . Carotid endarterectomy Left 1991     Stroke and TIA in 1999 with right sided weakness, now with residual right arm weakness  .  Lumbar laminectomy      'herniated disc"  . Back surgery    . Colonoscopy  07/30/2011    Procedure: COLONOSCOPY;  Surgeon: Inda Castle, MD;  Location: [place];  Service: Endoscopy;  Laterality: N/A;  gi bleed  . Esophagogastroduodenoscopy  07/30/2011    Procedure: ESOPHAGOGASTRODUODENOSCOPY (EGD);  Surgeon: Inda Castle, MD;  Location: [place];  Service: Endoscopy;  Laterality: N/A;  . Permanent pacemaker insertion Left 06/28/2011    Procedure: PERMANENT PACEMAKER INSERTION;  Surgeon: Deboraha Sprang, MD;  Location: [facility] CATH LAB;  Service: Cardiovascular;  Laterality: Left;  . Abdominal angiogram N/A 07/06/2011    Procedure: ABDOMINAL ANGIOGRAM;  Surgeon: Laverda Page, MD;  Location[facility] CATH LAB;  Service: Cardiovascular;  Laterality: N/A;  . Renal angiogram N/A 07/06/2011    Procedure: RENAL ANGIOGRAM;  Surgeon: Laverda Page, MD;  Location: [facility] CATH LAB;  Service: Cardiovascular;  Laterality: N/A;  . Lower extremity angiogram Right 05/29/2012    Procedure: LOWER EXTREMITY ANGIOGRAM;  Surgeon: Laverda Page, MD;  Location: [facility] CATH LAB;  Service: Cardiovascular;  Laterality: Right;  . Tonsillectomy    . Insert / replace / remove pacemaker    . Excisional hemorrhoidectomy    . Carpal tunnel release Left   . Vaginal hysterectomy    .  Peripheral vascular catheterization N/A 11/05/2014    Procedure: Abdominal Aortogram;  Surgeon: Serafina Mitchell, MD;  Location: [place] CV LAB;  Service: Cardiovascular;  Laterality: N/A;   Family History  Problem Relation Age of Onset  . Cancer Father   . Heart attack Mother    Social History  Substance Use Topics  . Smoking status: Current Every Day Smoker -- 0.00 packs/day for 70 years    Types: Cigarettes  . Smokeless tobacco: Never Used     Comment: 09/11/2014 "I used to smoke very heavy; down to 1 cigarette/day"  . Alcohol Use: Yes     Comment: "stopped drinking back in the 1970's"   OB History    No data available      Review of Systems  Constitutional: Positive for activity change, appetite change, fatigue and unexpected weight change. Negative for fever and chills.  HENT: Positive for congestion. Negative for sinus pressure, sneezing and sore throat.   Eyes: Negative for pain and redness.  Respiratory: Positive for cough. Negative for chest tightness and shortness of breath.   Cardiovascular: Negative for chest pain and palpitations.  Gastrointestinal: Negative for nausea, vomiting, abdominal pain and diarrhea.  Genitourinary: Negative for dysuria, urgency and hematuria.  Musculoskeletal: Negative for myalgias and back pain.  Skin: Negative for rash.  Neurological: Negative for dizziness, syncope, facial asymmetry, weakness and headaches.  Hematological: Does not bruise/bleed easily.      Allergies  Norvasc and Peanut-containing drug products  Home Medications   Prior to Admission medications   Medication Sig Start Date End Date Taking? Authorizing Provider  aspirin EC 81 MG tablet Take 81 mg by mouth daily.   Yes Historical Provider, MD  benazepril (LOTENSIN) 40 MG tablet Take 40 mg by mouth daily. 01/28/15  Yes Historical Provider, MD  clopidogrel (PLAVIX) 75 MG tablet Take 75 mg by mouth daily.   Yes Historical Provider, MD  furosemide (LASIX) 20 MG tablet Take 20 mg by mouth 2 (two) times daily. 01/28/15  Yes Historical Provider, MD  hydrALAZINE (APRESOLINE) 100 MG tablet Take 1 tablet (100 mg total) by mouth 4 (four) times daily. 01/16/15  Yes Belkys A Regalado, MD  linagliptin (TRADJENTA) 5 MG TABS tablet Take 1 tablet (5 mg total) by mouth daily. 12/01/14  Yes Lauree Chandler, NP  lisinopril (PRINIVIL,ZESTRIL) 10 MG tablet Take 10 mg by mouth daily. 02/04/15  Yes Historical Provider, MD  metoprolol tartrate (LOPRESSOR) 25 MG tablet Take 25 mg by mouth 2 (two) times daily.   Yes Historical Provider, MD  pantoprazole (PROTONIX) 40 MG tablet Take 1 tablet (40 mg total) by mouth daily.  01/16/15  Yes Belkys A Regalado, MD  potassium chloride (K-DUR) 10 MEQ tablet Take 20 mEq by mouth daily. Take with food 01/28/15  Yes Historical Provider, MD  acetaminophen-codeine (TYLENOL #4) 300-60 MG tablet Take 1 tablet by mouth every 6 (six) hours as needed for moderate pain. Patient not taking: Reported on 02/15/2015 12/06/14   Jola Schmidt, MD  diazepam (VALIUM) 5 MG tablet Take 1 tablet (5 mg total) by mouth 2 (two) times daily. Patient not taking: Reported on 02/15/2015 11/07/14   Velvet Bathe, MD  doxycycline (VIBRAMYCIN) 100 MG capsule Take 1 capsule (100 mg total) by mouth 2 (two) times daily. 02/15/15   Dorie Rank, MD  feeding supplement, GLUCERNA SHAKE, (GLUCERNA SHAKE) LIQD Take 237 mLs by mouth 2 (two) times daily between meals. Patient not taking: Reported on 02/15/2015 01/16/15   Belkys A Regalado,  MD  guaiFENesin (MUCINEX) 600 MG 12 hr tablet Take 1 tablet (600 mg total) by mouth 2 (two) times daily as needed. 02/15/15   Dorie Rank, MD  isosorbide mononitrate (IMDUR) 120 MG 24 hr tablet Take 1 tablet (120 mg total) by mouth daily. 12/12/12   Annita Brod, MD  Omega-3 Fatty Acids (FISH OIL) 1000 MG CAPS Take 1,000 mg by mouth 2 (two) times daily.     Historical Provider, MD  tamsulosin (FLOMAX) 0.4 MG CAPS capsule Take 1 capsule (0.4 mg total) by mouth daily after supper. Patient not taking: Reported on 02/15/2015 11/07/14   Velvet Bathe, MD   BP 152/58 mmHg  Pulse 60  Temp(Src) 98.1 F (36.7 C) (Oral)  Resp 17  Ht 5\' 4"  (1.626 m)  Wt 100 lb (45.36 kg)  BMI 17.16 kg/m2  SpO2 98% Physical Exam  Constitutional: She is oriented to person, place, and time. She appears cachectic. No distress.  HENT:  Head: Normocephalic and atraumatic.  Right Ear: External ear normal.  Left Ear: External ear normal.  Mouth/Throat: Oropharynx is clear and moist. No oropharyngeal exudate.  Eyes: EOM are normal. Pupils are equal, round, and reactive to light.  Neck: Normal range of motion.  Neck supple.  Cardiovascular: Normal rate, regular rhythm and intact distal pulses.   Pulmonary/Chest: Effort normal. No respiratory distress. She has no wheezes. She has no rales.  Abdominal: Soft. She exhibits no distension. There is no tenderness.  Neurological: She is alert and oriented to person, place, and time. No cranial nerve deficit.  Contracted right arm.  Patient with generalized muscular atrophy.  Patient with symmetric strength in all extremities although her strength is mildly diminished in all extremities.  Skin: Skin is warm and dry. No rash noted. She is not diaphoretic.  Vitals reviewed.   ED Course  Procedures (including critical care time) Labs Review Labs Reviewed  CBC WITH DIFFERENTIAL/PLATELET - Abnormal; Notable for the following:    WBC 3.7 (*)    All other components within normal limits  URINALYSIS, ROUTINE W REFLEX MICROSCOPIC (NOT AT [facility]) - Abnormal; Notable for the following:    APPearance HAZY (*)    Protein, ur 30 (*)    All other components within normal limits  COMPREHENSIVE METABOLIC PANEL - Abnormal; Notable for the following:    Sodium 132 (*)    CO2 20 (*)    Glucose, Bld 242 (*)    BUN 65 (*)    Creatinine, Ser 1.74 (*)    GFR calc non Af Amer 27 (*)    GFR calc Af Amer 31 (*)    All other components within normal limits  URINE MICROSCOPIC-ADD ON - Abnormal; Notable for the following:    Squamous Epithelial / LPF 0-5 (*)    Bacteria, UA RARE (*)    Casts HYALINE CASTS (*)    All other components within normal limits  I-STAT CG4 LACTIC ACID, ED  Randolm Idol, ED    Imaging Review Dg Chest 2 View  02/22/2015  CLINICAL DATA:  Cough and weakness. EXAM: CHEST  2 VIEW COMPARISON:  02/15/2015 FINDINGS: Left chest wall pacer device is noted with leads in the right atrial appendage and right ventricle. Aortic atherosclerosis noted. Mild cardiac enlargement. No pleural effusion or edema. IMPRESSION: 1. Mild cardiac enlargement. 2. Aortic  atherosclerosis Electronically Signed   By: Kerby Moors M.D.   On: 02/22/2015 18:57   I have personally reviewed and evaluated these images and lab results  as part of my medical decision-making.   EKG Interpretation   Date/Time:  Saturday February 22 2015 17:51:18 EST Ventricular Rate:  60 PR Interval:  124 QRS Duration: 90 QT Interval:  445 QTC Calculation: 445 R Axis:   21 Text Interpretation:  Sinus rhythm LVH with secondary repolarization  abnormality Anterior ST elevation, probably due to LVH which appear  similar to previous EKG including from 01/14/2015 Confirmed by Orma Cheetham,  Shantee Hayne ([id number]) on 02/22/2015 5:57:11 PM      MDM  Patient was seen and evaluated in stable condition.  Labs, EKG, chest xray unremarkable.  Patient cachectic and frail bit with no sign of acute infection or metabolic process.  Patient ate a [place] sandwich and apple sauce in the ED.  Pacemaker was interrogated and was reported to be unremarkable.  All results were discussed at length with patient and daughter at bedside.  Instructed to attempt to feed the patient smaller, more frequent meals.  Patient was discharged home in stable condition with instruction to follow up with her PCP. Final diagnoses:  Generalized weakness    1. Generalized weakness    Harvel Quale, MD 02/23/15 [phone]

## 2015-02-22 NOTE — ED Notes (Signed)
Pt requested a [place] sandwich and a diet coke. Ok per [employer], pt given a [place] sandwich and a diet coke. Pt requested [place] sandwich heated up that was preformed for 30 sec and given back to pt.

## 2015-02-22 NOTE — ED Notes (Signed)
Lab called and stated that blood for CMP hemolyzed and needs to be redrawn,.

## 2015-02-24 DIAGNOSIS — N08 Glomerular disorders in diseases classified elsewhere: Secondary | ICD-10-CM | POA: Diagnosis not present

## 2015-02-24 DIAGNOSIS — R630 Anorexia: Secondary | ICD-10-CM | POA: Diagnosis not present

## 2015-02-24 DIAGNOSIS — E860 Dehydration: Secondary | ICD-10-CM | POA: Diagnosis not present

## 2015-02-24 DIAGNOSIS — E1122 Type 2 diabetes mellitus with diabetic chronic kidney disease: Secondary | ICD-10-CM | POA: Diagnosis not present

## 2015-02-24 DIAGNOSIS — E46 Unspecified protein-calorie malnutrition: Secondary | ICD-10-CM | POA: Diagnosis not present

## 2015-02-24 DIAGNOSIS — N183 Chronic kidney disease, stage 3 (moderate): Secondary | ICD-10-CM | POA: Diagnosis not present

## 2015-02-25 DIAGNOSIS — I10 Essential (primary) hypertension: Secondary | ICD-10-CM | POA: Diagnosis not present

## 2015-02-25 DIAGNOSIS — M25511 Pain in right shoulder: Secondary | ICD-10-CM | POA: Diagnosis not present

## 2015-02-25 DIAGNOSIS — Z950 Presence of cardiac pacemaker: Secondary | ICD-10-CM | POA: Diagnosis not present

## 2015-02-25 DIAGNOSIS — N390 Urinary tract infection, site not specified: Secondary | ICD-10-CM | POA: Diagnosis not present

## 2015-02-25 DIAGNOSIS — I2510 Atherosclerotic heart disease of native coronary artery without angina pectoris: Secondary | ICD-10-CM | POA: Diagnosis not present

## 2015-02-25 DIAGNOSIS — M545 Low back pain: Secondary | ICD-10-CM | POA: Diagnosis not present

## 2015-02-25 DIAGNOSIS — Z720 Tobacco use: Secondary | ICD-10-CM | POA: Diagnosis not present

## 2015-02-25 DIAGNOSIS — E1151 Type 2 diabetes mellitus with diabetic peripheral angiopathy without gangrene: Secondary | ICD-10-CM | POA: Diagnosis not present

## 2015-02-25 DIAGNOSIS — F419 Anxiety disorder, unspecified: Secondary | ICD-10-CM | POA: Diagnosis not present

## 2015-02-25 DIAGNOSIS — Z8673 Personal history of transient ischemic attack (TIA), and cerebral infarction without residual deficits: Secondary | ICD-10-CM | POA: Diagnosis not present

## 2015-02-25 DIAGNOSIS — M5126 Other intervertebral disc displacement, lumbar region: Secondary | ICD-10-CM | POA: Diagnosis not present

## 2015-02-25 DIAGNOSIS — Z7901 Long term (current) use of anticoagulants: Secondary | ICD-10-CM | POA: Diagnosis not present

## 2015-02-26 ENCOUNTER — Encounter (HOSPITAL_COMMUNITY): Payer: Self-pay | Admitting: Emergency Medicine

## 2015-02-26 ENCOUNTER — Observation Stay (HOSPITAL_COMMUNITY): Payer: Medicare Other

## 2015-02-26 ENCOUNTER — Emergency Department (HOSPITAL_COMMUNITY): Payer: Medicare Other

## 2015-02-26 ENCOUNTER — Inpatient Hospital Stay (HOSPITAL_COMMUNITY)
Admission: EM | Admit: 2015-02-26 | Discharge: 2015-03-01 | DRG: 682 | Disposition: A | Discharge status: Hospice | Payer: Medicare Other | Attending: Internal Medicine | Admitting: Internal Medicine

## 2015-02-26 DIAGNOSIS — E1165 Type 2 diabetes mellitus with hyperglycemia: Secondary | ICD-10-CM | POA: Diagnosis not present

## 2015-02-26 DIAGNOSIS — I639 Cerebral infarction, unspecified: Secondary | ICD-10-CM | POA: Diagnosis present

## 2015-02-26 DIAGNOSIS — I2510 Atherosclerotic heart disease of native coronary artery without angina pectoris: Secondary | ICD-10-CM | POA: Diagnosis not present

## 2015-02-26 DIAGNOSIS — Z681 Body mass index (BMI) 19 or less, adult: Secondary | ICD-10-CM | POA: Diagnosis not present

## 2015-02-26 DIAGNOSIS — I10 Essential (primary) hypertension: Secondary | ICD-10-CM | POA: Diagnosis present

## 2015-02-26 DIAGNOSIS — E875 Hyperkalemia: Secondary | ICD-10-CM | POA: Diagnosis not present

## 2015-02-26 DIAGNOSIS — R627 Adult failure to thrive: Secondary | ICD-10-CM | POA: Diagnosis present

## 2015-02-26 DIAGNOSIS — Z8673 Personal history of transient ischemic attack (TIA), and cerebral infarction without residual deficits: Secondary | ICD-10-CM

## 2015-02-26 DIAGNOSIS — E1151 Type 2 diabetes mellitus with diabetic peripheral angiopathy without gangrene: Secondary | ICD-10-CM | POA: Diagnosis not present

## 2015-02-26 DIAGNOSIS — R05 Cough: Secondary | ICD-10-CM | POA: Diagnosis not present

## 2015-02-26 DIAGNOSIS — Z7189 Other specified counseling: Secondary | ICD-10-CM

## 2015-02-26 DIAGNOSIS — R404 Transient alteration of awareness: Secondary | ICD-10-CM | POA: Diagnosis not present

## 2015-02-26 DIAGNOSIS — Z66 Do not resuscitate: Secondary | ICD-10-CM | POA: Diagnosis not present

## 2015-02-26 DIAGNOSIS — N183 Chronic kidney disease, stage 3 unspecified: Secondary | ICD-10-CM

## 2015-02-26 DIAGNOSIS — E11649 Type 2 diabetes mellitus with hypoglycemia without coma: Secondary | ICD-10-CM | POA: Diagnosis not present

## 2015-02-26 DIAGNOSIS — N189 Chronic kidney disease, unspecified: Secondary | ICD-10-CM | POA: Diagnosis present

## 2015-02-26 DIAGNOSIS — N3000 Acute cystitis without hematuria: Secondary | ICD-10-CM | POA: Diagnosis not present

## 2015-02-26 DIAGNOSIS — K5900 Constipation, unspecified: Secondary | ICD-10-CM | POA: Insufficient documentation

## 2015-02-26 DIAGNOSIS — E860 Dehydration: Secondary | ICD-10-CM | POA: Diagnosis not present

## 2015-02-26 DIAGNOSIS — Z79899 Other long term (current) drug therapy: Secondary | ICD-10-CM | POA: Diagnosis not present

## 2015-02-26 DIAGNOSIS — N179 Acute kidney failure, unspecified: Principal | ICD-10-CM | POA: Diagnosis present

## 2015-02-26 DIAGNOSIS — E119 Type 2 diabetes mellitus without complications: Secondary | ICD-10-CM | POA: Diagnosis present

## 2015-02-26 DIAGNOSIS — Z7902 Long term (current) use of antithrombotics/antiplatelets: Secondary | ICD-10-CM

## 2015-02-26 DIAGNOSIS — I129 Hypertensive chronic kidney disease with stage 1 through stage 4 chronic kidney disease, or unspecified chronic kidney disease: Secondary | ICD-10-CM | POA: Diagnosis not present

## 2015-02-26 DIAGNOSIS — M1990 Unspecified osteoarthritis, unspecified site: Secondary | ICD-10-CM | POA: Diagnosis present

## 2015-02-26 DIAGNOSIS — F17210 Nicotine dependence, cigarettes, uncomplicated: Secondary | ICD-10-CM | POA: Diagnosis present

## 2015-02-26 DIAGNOSIS — H269 Unspecified cataract: Secondary | ICD-10-CM | POA: Diagnosis not present

## 2015-02-26 DIAGNOSIS — J189 Pneumonia, unspecified organism: Secondary | ICD-10-CM

## 2015-02-26 DIAGNOSIS — Z515 Encounter for palliative care: Secondary | ICD-10-CM | POA: Diagnosis not present

## 2015-02-26 DIAGNOSIS — Z950 Presence of cardiac pacemaker: Secondary | ICD-10-CM | POA: Diagnosis present

## 2015-02-26 DIAGNOSIS — R531 Weakness: Secondary | ICD-10-CM | POA: Diagnosis not present

## 2015-02-26 DIAGNOSIS — Z0389 Encounter for observation for other suspected diseases and conditions ruled out: Secondary | ICD-10-CM | POA: Diagnosis not present

## 2015-02-26 DIAGNOSIS — E43 Unspecified severe protein-calorie malnutrition: Secondary | ICD-10-CM | POA: Diagnosis present

## 2015-02-26 DIAGNOSIS — R64 Cachexia: Secondary | ICD-10-CM | POA: Diagnosis present

## 2015-02-26 DIAGNOSIS — I120 Hypertensive chronic kidney disease with stage 5 chronic kidney disease or end stage renal disease: Secondary | ICD-10-CM | POA: Diagnosis not present

## 2015-02-26 DIAGNOSIS — F419 Anxiety disorder, unspecified: Secondary | ICD-10-CM | POA: Diagnosis not present

## 2015-02-26 DIAGNOSIS — E1122 Type 2 diabetes mellitus with diabetic chronic kidney disease: Secondary | ICD-10-CM | POA: Diagnosis not present

## 2015-02-26 DIAGNOSIS — Z7982 Long term (current) use of aspirin: Secondary | ICD-10-CM

## 2015-02-26 DIAGNOSIS — K5909 Other constipation: Secondary | ICD-10-CM | POA: Diagnosis not present

## 2015-02-26 DIAGNOSIS — IMO0002 Reserved for concepts with insufficient information to code with codable children: Secondary | ICD-10-CM

## 2015-02-26 HISTORY — DX: Urinary tract infection, site not specified: N39.0

## 2015-02-26 LAB — CBC WITH DIFFERENTIAL/PLATELET
BASOS ABS: 0 10*3/uL (ref 0.0–0.1)
BASOS PCT: 1 %
Eosinophils Absolute: 0.1 10*3/uL (ref 0.0–0.7)
Eosinophils Relative: 1 %
HEMATOCRIT: 43.9 % (ref 36.0–46.0)
HEMOGLOBIN: 14.7 g/dL (ref 12.0–15.0)
Lymphocytes Relative: 32 %
Lymphs Abs: 1.3 10*3/uL (ref 0.7–4.0)
MCH: 33.2 pg (ref 26.0–34.0)
MCHC: 33.5 g/dL (ref 30.0–36.0)
MCV: 99.1 fL (ref 78.0–100.0)
MONOS PCT: 8 %
Monocytes Absolute: 0.3 10*3/uL (ref 0.1–1.0)
NEUTROS PCT: 58 %
Neutro Abs: 2.4 10*3/uL (ref 1.7–7.7)
Platelets: 202 10*3/uL (ref 150–400)
RBC: 4.43 MIL/uL (ref 3.87–5.11)
RDW: 13.1 % (ref 11.5–15.5)
WBC: 4.2 10*3/uL (ref 4.0–10.5)

## 2015-02-26 LAB — URINE MICROSCOPIC-ADD ON: RBC / HPF: NONE SEEN RBC/hpf (ref 0–5)

## 2015-02-26 LAB — BASIC METABOLIC PANEL
ANION GAP: 10 (ref 5–15)
BUN: 92 mg/dL — ABNORMAL HIGH (ref 6–20)
CALCIUM: 8.5 mg/dL — AB (ref 8.9–10.3)
CO2: 19 mmol/L — AB (ref 22–32)
CREATININE: 2.21 mg/dL — AB (ref 0.44–1.00)
Chloride: 103 mmol/L (ref 101–111)
GFR, EST AFRICAN AMERICAN: 23 mL/min — AB (ref >60)
GFR, EST NON AFRICAN AMERICAN: 20 mL/min — AB (ref >60)
Glucose, Bld: 651 mg/dL (ref 65–99)
Potassium: 6.3 mmol/L (ref 3.5–5.1)
SODIUM: 132 mmol/L — AB (ref 135–145)

## 2015-02-26 LAB — COMPREHENSIVE METABOLIC PANEL
ALBUMIN: 3.5 g/dL (ref 3.5–5.0)
ALK PHOS: 93 U/L (ref 38–126)
ALT: 38 U/L (ref 14–54)
AST: 45 U/L — AB (ref 15–41)
Anion gap: 10 (ref 5–15)
BILIRUBIN TOTAL: 0.5 mg/dL (ref 0.3–1.2)
BUN: 102 mg/dL — AB (ref 6–20)
CALCIUM: 9.4 mg/dL (ref 8.9–10.3)
CO2: 24 mmol/L (ref 22–32)
Chloride: 106 mmol/L (ref 101–111)
Creatinine, Ser: 1.98 mg/dL — ABNORMAL HIGH (ref 0.44–1.00)
GFR calc Af Amer: 26 mL/min — ABNORMAL LOW (ref >60)
GFR calc non Af Amer: 23 mL/min — ABNORMAL LOW (ref >60)
GLUCOSE: 204 mg/dL — AB (ref 65–99)
Potassium: 5.1 mmol/L (ref 3.5–5.1)
Sodium: 140 mmol/L (ref 135–145)
TOTAL PROTEIN: 6.8 g/dL (ref 6.5–8.1)

## 2015-02-26 LAB — I-STAT CG4 LACTIC ACID, ED: Lactic Acid, Venous: 1.3 mmol/L (ref 0.5–2.0)

## 2015-02-26 LAB — URINALYSIS, ROUTINE W REFLEX MICROSCOPIC
BILIRUBIN URINE: NEGATIVE
Glucose, UA: NEGATIVE mg/dL
HGB URINE DIPSTICK: NEGATIVE
Ketones, ur: NEGATIVE mg/dL
NITRITE: NEGATIVE
PH: 5 (ref 5.0–8.0)
Protein, ur: 30 mg/dL — AB
SPECIFIC GRAVITY, URINE: 1.01 (ref 1.005–1.030)

## 2015-02-26 LAB — I-STAT TROPONIN, ED
TROPONIN I, POC: 0.01 ng/mL (ref 0.00–0.08)
Troponin i, poc: 0.02 ng/mL (ref 0.00–0.08)

## 2015-02-26 LAB — GLUCOSE, CAPILLARY
GLUCOSE-CAPILLARY: 486 mg/dL — AB (ref 65–99)
GLUCOSE-CAPILLARY: 527 mg/dL — AB (ref 65–99)
Glucose-Capillary: 473 mg/dL — ABNORMAL HIGH (ref 65–99)

## 2015-02-26 LAB — LIPASE, BLOOD: LIPASE: 14 U/L (ref 11–51)

## 2015-02-26 LAB — MAGNESIUM: Magnesium: 2.5 mg/dL — ABNORMAL HIGH (ref 1.7–2.4)

## 2015-02-26 LAB — PHOSPHORUS: Phosphorus: 5.3 mg/dL — ABNORMAL HIGH (ref 2.5–4.6)

## 2015-02-26 MED ORDER — SODIUM CHLORIDE 0.9 % IV SOLN
INTRAVENOUS | Status: DC
Start: 2015-02-26 — End: 2015-02-27
  Administered 2015-02-26: 13:00:00 via INTRAVENOUS

## 2015-02-26 MED ORDER — DEXTROSE-NACL 5-0.45 % IV SOLN
INTRAVENOUS | Status: DC
  Administered 2015-02-27: 03:00:00 via INTRAVENOUS

## 2015-02-26 MED ORDER — HYDRALAZINE HCL 50 MG PO TABS
100.0000 mg | ORAL_TABLET | Freq: Four times a day (QID) | ORAL | Status: DC
  Administered 2015-02-26 – 2015-03-01 (×11): 100 mg via ORAL
  Filled 2015-02-26 (×11): qty 2

## 2015-02-26 MED ORDER — ISOSORBIDE MONONITRATE ER 60 MG PO TB24
120.0000 mg | ORAL_TABLET | Freq: Every day | ORAL | Status: DC
  Administered 2015-02-26 – 2015-03-01 (×4): 120 mg via ORAL
  Filled 2015-02-26 (×4): qty 2

## 2015-02-26 MED ORDER — CEFTRIAXONE SODIUM 1 G IJ SOLR
1.0000 g | INTRAMUSCULAR | Status: DC
Start: 2015-02-26 — End: 2015-03-01
  Administered 2015-02-26 – 2015-02-28 (×3): 1 g via INTRAVENOUS
  Filled 2015-02-26 (×4): qty 10

## 2015-02-26 MED ORDER — SODIUM CHLORIDE 0.9 % IV BOLUS (SEPSIS)
500.0000 mL | Freq: Once | INTRAVENOUS | Status: AC
Start: 2015-02-26 — End: 2015-02-26
  Administered 2015-02-26: 500 mL via INTRAVENOUS

## 2015-02-26 MED ORDER — INSULIN ASPART 100 UNIT/ML ~~LOC~~ SOLN
0.0000 [IU] | Freq: Three times a day (TID) | SUBCUTANEOUS | Status: DC
Start: 2015-02-26 — End: 2015-02-26

## 2015-02-26 MED ORDER — BARRIER CREAM NON-SPECIFIED
1.0000 "application " | TOPICAL_CREAM | Freq: Two times a day (BID) | TOPICAL | Status: DC | PRN
  Filled 2015-02-26: qty 1

## 2015-02-26 MED ORDER — PANTOPRAZOLE SODIUM 40 MG PO TBEC
40.0000 mg | DELAYED_RELEASE_TABLET | Freq: Every day | ORAL | Status: DC
  Administered 2015-02-26 – 2015-03-01 (×4): 40 mg via ORAL
  Filled 2015-02-26 (×4): qty 1

## 2015-02-26 MED ORDER — SODIUM CHLORIDE 0.9 % IV SOLN
INTRAVENOUS | Status: DC
  Administered 2015-02-26: 4.1 [IU]/h via INTRAVENOUS
  Filled 2015-02-26: qty 2.5

## 2015-02-26 MED ORDER — ONDANSETRON HCL 4 MG/2ML IJ SOLN: 4.0000 mg | Freq: Four times a day (QID) | INTRAMUSCULAR | Status: DC | PRN

## 2015-02-26 MED ORDER — ALBUTEROL SULFATE (2.5 MG/3ML) 0.083% IN NEBU
2.5000 mg | INHALATION_SOLUTION | Freq: Four times a day (QID) | RESPIRATORY_TRACT | Status: DC
  Filled 2015-02-26: qty 3

## 2015-02-26 MED ORDER — ALBUTEROL SULFATE (2.5 MG/3ML) 0.083% IN NEBU: 2.5000 mg | INHALATION_SOLUTION | Freq: Four times a day (QID) | RESPIRATORY_TRACT | Status: DC

## 2015-02-26 MED ORDER — PANCRELIPASE (LIP-PROT-AMYL) 12000-38000 UNITS PO CPEP: 12000.0000 [IU] | ORAL_CAPSULE | Freq: Three times a day (TID) | ORAL | Status: DC

## 2015-02-26 MED ORDER — GUAIFENESIN-DM 100-10 MG/5ML PO SYRP: 5.0000 mL | ORAL_SOLUTION | ORAL | Status: DC | PRN

## 2015-02-26 MED ORDER — ALUM & MAG HYDROXIDE-SIMETH 200-200-20 MG/5ML PO SUSP: 30.0000 mL | Freq: Four times a day (QID) | ORAL | Status: DC | PRN

## 2015-02-26 MED ORDER — ENSURE ENLIVE PO LIQD: 237.0000 mL | Freq: Two times a day (BID) | ORAL | Status: DC

## 2015-02-26 MED ORDER — SODIUM CHLORIDE 0.9 % IV SOLN
INTRAVENOUS | Status: DC
  Administered 2015-02-26: 21:00:00 via INTRAVENOUS

## 2015-02-26 MED ORDER — PANCRELIPASE (LIP-PROT-AMYL) 12000-38000 UNITS PO CPEP
12000.0000 [IU] | ORAL_CAPSULE | Freq: Three times a day (TID) | ORAL | Status: DC
  Administered 2015-02-26 – 2015-03-01 (×7): 12000 [IU] via ORAL
  Filled 2015-02-26 (×7): qty 1

## 2015-02-26 MED ORDER — OMEGA-3-ACID ETHYL ESTERS 1 G PO CAPS
1.0000 g | ORAL_CAPSULE | Freq: Every day | ORAL | Status: DC
  Administered 2015-02-26 – 2015-03-01 (×4): 1 g via ORAL
  Filled 2015-02-26 (×4): qty 1

## 2015-02-26 MED ORDER — ONDANSETRON HCL 4 MG PO TABS: 4.0000 mg | ORAL_TABLET | Freq: Four times a day (QID) | ORAL | Status: DC | PRN

## 2015-02-26 MED ORDER — ASPIRIN EC 81 MG PO TBEC
81.0000 mg | DELAYED_RELEASE_TABLET | Freq: Every day | ORAL | Status: DC
  Administered 2015-02-26 – 2015-03-01 (×4): 81 mg via ORAL
  Filled 2015-02-26 (×4): qty 1

## 2015-02-26 MED ORDER — ACETAMINOPHEN 325 MG PO TABS
650.0000 mg | ORAL_TABLET | Freq: Four times a day (QID) | ORAL | Status: DC | PRN
  Administered 2015-02-26: 650 mg via ORAL
  Filled 2015-02-26: qty 2

## 2015-02-26 MED ORDER — SODIUM CHLORIDE 0.9 % IV SOLN: INTRAVENOUS | Status: DC

## 2015-02-26 MED ORDER — GLUCERNA SHAKE PO LIQD
237.0000 mL | Freq: Two times a day (BID) | ORAL | Status: DC
  Administered 2015-02-27 (×2): 237 mL via ORAL

## 2015-02-26 MED ORDER — SENNOSIDES-DOCUSATE SODIUM 8.6-50 MG PO TABS: 1.0000 | ORAL_TABLET | Freq: Every evening | ORAL | Status: DC | PRN

## 2015-02-26 MED ORDER — ACETAMINOPHEN 650 MG RE SUPP: 650.0000 mg | Freq: Four times a day (QID) | RECTAL | Status: DC | PRN

## 2015-02-26 MED ORDER — CLOPIDOGREL BISULFATE 75 MG PO TABS
75.0000 mg | ORAL_TABLET | Freq: Every day | ORAL | Status: DC
  Administered 2015-02-26 – 2015-03-01 (×4): 75 mg via ORAL
  Filled 2015-02-26 (×4): qty 1

## 2015-02-26 MED ORDER — WHITE PETROLATUM GEL
Status: AC
  Administered 2015-02-26: 15:00:00
  Filled 2015-02-26: qty 1

## 2015-02-26 MED ORDER — SORBITOL 70 % SOLN
30.0000 mL | Freq: Every day | Status: DC | PRN
  Filled 2015-02-26: qty 30

## 2015-02-26 MED ORDER — HEPARIN SODIUM (PORCINE) 5000 UNIT/ML IJ SOLN
5000.0000 [IU] | Freq: Three times a day (TID) | INTRAMUSCULAR | Status: DC
  Administered 2015-02-26 – 2015-03-01 (×8): 5000 [IU] via SUBCUTANEOUS
  Filled 2015-02-26 (×7): qty 1

## 2015-02-26 NOTE — Progress Notes (Signed)
Notified MD of pt's blood sugar.  MD stated to get BMP stat and inform her of results.  Based on results would determine whether she treats it with SQ Insulin or drip.

## 2015-02-26 NOTE — ED Notes (Signed)
Pt to ED via [facility] from home with family c/o not eating. Family states pt has lost > 20# over past week or two. Pt is alert/oriented x 4, granddaughter with pt-- has been helping to take care of pt. Pt has flat affect,

## 2015-02-26 NOTE — Progress Notes (Signed)
CRITICAL VALUE ALERT  Critical value received:  2049  Date of notification:  02/26/2015  Time of notification:  2049  Critical value read back:Yes.    Nurse who received alert:  Dorris Carnes  MD notified (1st page):  Maryjane Hurter  Time of first page:  2055 (text page)  MD notified (2nd page):  Time of second page:  Responding MD:  Maryjane Hurter  Time MD responded:  2102

## 2015-02-26 NOTE — ED Notes (Signed)
Pt eating ice cream-- granddaughter feeding pt.

## 2015-02-26 NOTE — Progress Notes (Signed)
Pt admitted to 6N06 via stretcher from ED.  Pt AAO X 4.  Pt on RA. Pt has 22 to RT FA with fluids infusing.  Family and belongings to bedside.  Report rcvd from [place], Therapist, sports.  Will continue to monitor.

## 2015-02-26 NOTE — ED Notes (Signed)
Patient family requested blood draws from IV only

## 2015-02-26 NOTE — Care Management (Signed)
Consult for home health needs   However, H and P states :  May need Palliative or Hospice support. Family is leaning toward more comfort services for this precious lady rather than aggressive care.  If MD prefers can arrange outpatient palliative consult.   Await goals of care meeting .  Magdalen Spatz RN BSN [phone]

## 2015-02-26 NOTE — ED Provider Notes (Signed)
CSN: [id number]     Arrival date & time 02/26/15  0831 History   First MD Initiated Contact with Patient 02/26/15 [phone]     Chief Complaint  Patient presents with  . Failure To Thrive  . poor appetite      (Consider location/radiation/quality/duration/timing/severity/associated sxs/prior Treatment) HPI Comments: 2 weeks of decreased appetitie, will not eat anything Grandaughter was feeding her broth which she accepted, however has not for last 2 days Pt has lost about 20lb, is fatigued Pt reports she has a cold so has not felt like eating Reports cough, nasal congestion Mucinex is not helping Has been to normal doctor and ED several times for same concerns with worsening   Past Medical History  Diagnosis Date  . Hypertension   . Coronary artery disease   . Renal disorder   . Herniated lumbar intervertebral disc   . Cataract     "both eyes; blood pressure's always too high to have them fixed" (09/11/2014)  . Renovascular hypertension      s/p left renal artery stent 12/2007.  S/P balloon angioplasty on 02/16/10 for ISR, BP was  controlled well since then.     . Claudication in peripheral vascular disease ([place])     02/16/10: Left CIA 9.0x28 Omnilink and REIA 8.0x40 seff expanding [place]ilver.   right subclavian artery stent 03/18/2008,  . Peripheral vascular disease ([place])   . Anxiety   . Pacemaker   . Pneumonia X 2  . IDDM (insulin dependent diabetes mellitus) ([place])   . Migraine     "used to have terrible migraines; stopped in the 1990's"  . Stroke [facility]) 1999    Stroke and TIA in 1999 with right sided weakness, now with residual right arm weakness (09/11/2014)  . Arthritis     "everywhere"  . Chronic lower back pain   . UTI (lower urinary tract infection) 02/2015   Past Surgical History  Procedure Laterality Date  . Appendectomy    . Ureteral stent placement    . Carotid endarterectomy Left 1991     Stroke and TIA in 1999 with right sided weakness, now with residual right arm  weakness  . Lumbar laminectomy      'herniated disc"  . Back surgery    . Colonoscopy  07/30/2011    Procedure: COLONOSCOPY;  Surgeon: Inda Castle, MD;  Location: [place];  Service: Endoscopy;  Laterality: N/A;  gi bleed  . Esophagogastroduodenoscopy  07/30/2011    Procedure: ESOPHAGOGASTRODUODENOSCOPY (EGD);  Surgeon: Inda Castle, MD;  Location: [place];  Service: Endoscopy;  Laterality: N/A;  . Permanent pacemaker insertion Left 06/28/2011    Procedure: PERMANENT PACEMAKER INSERTION;  Surgeon: Deboraha Sprang, MD;  Location: [facility] CATH LAB;  Service: Cardiovascular;  Laterality: Left;  . Abdominal angiogram N/A 07/06/2011    Procedure: ABDOMINAL ANGIOGRAM;  Surgeon: Laverda Page, MD;  Location: [facility] CATH LAB;  Service: Cardiovascular;  Laterality: N/A;  . Renal angiogram N/A 07/06/2011    Procedure: RENAL ANGIOGRAM;  Surgeon: Laverda Page, MD;  Location: [facility] CATH LAB;  Service: Cardiovascular;  Laterality: N/A;  . Lower extremity angiogram Right 05/29/2012    Procedure: LOWER EXTREMITY ANGIOGRAM;  Surgeon: Laverda Page, MD;  Location: [facility] CATH LAB;  Service: Cardiovascular;  Laterality: Right;  . Tonsillectomy    . Insert / replace / remove pacemaker    . Excisional hemorrhoidectomy    . Carpal tunnel release Left   . Vaginal hysterectomy    . Peripheral  vascular catheterization N/A 11/05/2014    Procedure: Abdominal Aortogram;  Surgeon: Serafina Mitchell, MD;  Location: [place] CV LAB;  Service: Cardiovascular;  Laterality: N/A;   Family History  Problem Relation Age of Onset  . Cancer Father   . Heart attack Mother    Social History  Substance Use Topics  . Smoking status: Current Every Day Smoker -- 0.00 packs/day for 70 years    Types: Cigarettes  . Smokeless tobacco: Never Used     Comment: 09/11/2014 "I used to smoke very heavy; down to 1 cigarette/day"  . Alcohol Use: No     Comment: "stopped drinking back in the 1970's"   OB History    No data  available     Review of Systems  Constitutional: Positive for activity change, appetite change, fatigue and unexpected weight change. Negative for fever.  HENT: Positive for congestion and sore throat.   Eyes: Negative for visual disturbance.  Respiratory: Positive for cough. Negative for shortness of breath.   Cardiovascular: Negative for chest pain.  Gastrointestinal: Positive for nausea. Negative for vomiting, abdominal pain, diarrhea and constipation.  Genitourinary: Negative for difficulty urinating.  Musculoskeletal: Negative for back pain and neck pain.  Skin: Negative for rash.  Neurological: Negative for syncope and headaches.      Allergies  Norvasc and Peanut-containing drug products  Home Medications   Prior to Admission medications   Medication Sig Start Date End Date Taking? Authorizing Provider  aspirin EC 81 MG tablet Take 81 mg by mouth daily.   Yes Historical Provider, MD  clopidogrel (PLAVIX) 75 MG tablet Take 75 mg by mouth daily.   Yes Historical Provider, MD  feeding supplement, GLUCERNA SHAKE, (GLUCERNA SHAKE) LIQD Take 237 mLs by mouth 2 (two) times daily between meals. 01/16/15  Yes Belkys A Regalado, MD  guaiFENesin (MUCINEX) 600 MG 12 hr tablet Take 1 tablet (600 mg total) by mouth 2 (two) times daily as needed. 02/15/15  Yes Dorie Rank, MD  hydrALAZINE (APRESOLINE) 100 MG tablet Take 1 tablet (100 mg total) by mouth 4 (four) times daily. 01/16/15  Yes Belkys A Regalado, MD  linagliptin (TRADJENTA) 5 MG TABS tablet Take 1 tablet (5 mg total) by mouth daily. 12/01/14  Yes Lauree Chandler, NP  metoprolol tartrate (LOPRESSOR) 25 MG tablet Take 25 mg by mouth 2 (two) times daily.   Yes Historical Provider, MD  Omega-3 Fatty Acids (FISH OIL) 1000 MG CAPS Take 1,000 mg by mouth 2 (two) times daily.    Yes Historical Provider, MD  pantoprazole (PROTONIX) 40 MG tablet Take 1 tablet (40 mg total) by mouth daily. 01/16/15  Yes Belkys A Regalado, MD  potassium  chloride (K-DUR) 10 MEQ tablet Take 20 mEq by mouth daily. Take with food 01/28/15  Yes Historical Provider, MD  isosorbide mononitrate (IMDUR) 120 MG 24 hr tablet Take 1 tablet (120 mg total) by mouth daily. 12/12/12   Annita Brod, MD   BP 156/80 mmHg  Pulse 61  Temp(Src) 97.9 F (36.6 C) (Oral)  Resp 17  Wt 90 lb 8 oz (41.051 kg)  SpO2 100% Physical Exam  Constitutional: She is oriented to person, place, and time. She appears well-developed. She appears cachectic. No distress.  HENT:  Head: Normocephalic and atraumatic.  Eyes: Conjunctivae and EOM are normal.  Neck: Normal range of motion.  Cardiovascular: Normal rate, regular rhythm, normal heart sounds and intact distal pulses.  Exam reveals no gallop and no friction rub.  No murmur heard. Pulmonary/Chest: Effort normal and breath sounds normal. No respiratory distress. She has no wheezes. She has no rales.  Abdominal: Soft. She exhibits no distension. There is no tenderness. There is no guarding.  Musculoskeletal: She exhibits no edema or tenderness.  Neurological: She is alert and oriented to person, place, and time.  Skin: Skin is warm and dry. No rash noted. She is not diaphoretic. No erythema.  Nursing note and vitals reviewed.   ED Course  Procedures (including critical care time) Labs Review Labs Reviewed  COMPREHENSIVE METABOLIC PANEL - Abnormal; Notable for the following:    Glucose, Bld 204 (*)    BUN 102 (*)    Creatinine, Ser 1.98 (*)    AST 45 (*)    GFR calc non Af Amer 23 (*)    GFR calc Af Amer 26 (*)    All other components within normal limits  MAGNESIUM - Abnormal; Notable for the following:    Magnesium 2.5 (*)    All other components within normal limits  PHOSPHORUS - Abnormal; Notable for the following:    Phosphorus 5.3 (*)    All other components within normal limits  URINALYSIS, ROUTINE W REFLEX MICROSCOPIC (NOT AT [facility]) - Abnormal; Notable for the following:    APPearance HAZY (*)     Protein, ur 30 (*)    Leukocytes, UA SMALL (*)    All other components within normal limits  URINE MICROSCOPIC-ADD ON - Abnormal; Notable for the following:    Squamous Epithelial / LPF 6-30 (*)    Bacteria, UA MANY (*)    All other components within normal limits  HEMOGLOBIN A1C - Abnormal; Notable for the following:    Hgb A1c MFr Bld 7.6 (*)    All other components within normal limits  BASIC METABOLIC PANEL - Abnormal; Notable for the following:    CO2 18 (*)    Glucose, Bld 177 (*)    BUN 86 (*)    Creatinine, Ser 2.17 (*)    Calcium 8.4 (*)    GFR calc non Af Amer 20 (*)    GFR calc Af Amer 24 (*)    All other components within normal limits  GLUCOSE, CAPILLARY - Abnormal; Notable for the following:    Glucose-Capillary 527 (*)    All other components within normal limits  BASIC METABOLIC PANEL - Abnormal; Notable for the following:    Sodium 132 (*)    Potassium 6.3 (*)    CO2 19 (*)    Glucose, Bld 651 (*)    BUN 92 (*)    Creatinine, Ser 2.21 (*)    Calcium 8.5 (*)    GFR calc non Af Amer 20 (*)    GFR calc Af Amer 23 (*)    All other components within normal limits  BASIC METABOLIC PANEL - Abnormal; Notable for the following:    Sodium 130 (*)    Potassium 5.6 (*)    CO2 16 (*)    Glucose, Bld 563 (*)    BUN 88 (*)    Creatinine, Ser 2.28 (*)    Calcium 8.2 (*)    GFR calc non Af Amer 19 (*)    GFR calc Af Amer 22 (*)    All other components within normal limits  BASIC METABOLIC PANEL - Abnormal; Notable for the following:    CO2 19 (*)    Glucose, Bld 148 (*)    BUN 85 (*)    Creatinine,  Ser 2.16 (*)    Calcium 8.4 (*)    GFR calc non Af Amer 21 (*)    GFR calc Af Amer 24 (*)    All other components within normal limits  GLUCOSE, CAPILLARY - Abnormal; Notable for the following:    Glucose-Capillary 473 (*)    All other components within normal limits  GLUCOSE, CAPILLARY - Abnormal; Notable for the following:    Glucose-Capillary 486 (*)    All  other components within normal limits  GLUCOSE, CAPILLARY - Abnormal; Notable for the following:    Glucose-Capillary 393 (*)    All other components within normal limits  GLUCOSE, CAPILLARY - Abnormal; Notable for the following:    Glucose-Capillary 281 (*)    All other components within normal limits  GLUCOSE, CAPILLARY - Abnormal; Notable for the following:    Glucose-Capillary 188 (*)    All other components within normal limits  GLUCOSE, CAPILLARY - Abnormal; Notable for the following:    Glucose-Capillary 104 (*)    All other components within normal limits  GLUCOSE, CAPILLARY - Abnormal; Notable for the following:    Glucose-Capillary 115 (*)    All other components within normal limits  GLUCOSE, CAPILLARY - Abnormal; Notable for the following:    Glucose-Capillary 136 (*)    All other components within normal limits  GLUCOSE, CAPILLARY - Abnormal; Notable for the following:    Glucose-Capillary 149 (*)    All other components within normal limits  GLUCOSE, CAPILLARY - Abnormal; Notable for the following:    Glucose-Capillary 148 (*)    All other components within normal limits  GLUCOSE, CAPILLARY - Abnormal; Notable for the following:    Glucose-Capillary 156 (*)    All other components within normal limits  GLUCOSE, CAPILLARY - Abnormal; Notable for the following:    Glucose-Capillary 217 (*)    All other components within normal limits  URINE CULTURE  CBC WITH DIFFERENTIAL/PLATELET  LIPASE, BLOOD  GLUCOSE, CAPILLARY  GLUCOSE, CAPILLARY  HEMOGLOBIN A1C  I-STAT TROPOININ, ED  I-STAT CG4 LACTIC ACID, ED  Randolm Idol, ED    Imaging Review Dg Chest 2 View  02/26/2015  CLINICAL DATA:  Cough and congestion EXAM: CHEST  2 VIEW COMPARISON:  February 22, 2015 FINDINGS: There is no edema or consolidation. Heart is upper normal in size with pulmonary vascularity within normal limits. Pacemaker leads are attached to the right atrium and right ventricle. There is  calcification in the aorta. There is a stent in the proximal right subclavian artery region. No adenopathy. Bones appear osteoporotic. There is arthropathy in the right shoulder. IMPRESSION: No edema or consolidation. Electronically Signed   By: Lowella Grip III M.D.   On: 02/26/2015 09:18   Portable Chest 1 View  02/26/2015  CLINICAL DATA:  Productive cough. EXAM: PORTABLE CHEST 1 VIEW COMPARISON:  02/26/2015. FINDINGS: Questionable nodular densities noted in the right upper lobe and right lower lung. These are not present on prior recent exams. These could be extrinsic to the patient. Repeat PA and lateral chest x-ray is suggested. No focal infiltrate. No pleural effusion or pneumothorax. Cardiac pacer noted in stable position. Stable cardiomegaly. No pulmonary venous congestion. Right subclavian stent noted. Bilateral subclavian artery atherosclerotic vascular disease . IMPRESSION: 1. Questionable nodular densities in the right upper lobe and right lung base. These are not present on prior exams. These may be extrinsic to the patient. Repeat PA and lateral chest x-ray is suggested. Lungs are otherwise clear. 2.  Cardiac  pacer stable position.  Stable cardiomegaly. Electronically Signed   By: Marcello Moores  Register   On: 02/26/2015 16:10   Dg Abd Portable 1v  02/26/2015  CLINICAL DATA:  Not eating, drinking or having bowel movements. EXAM: PORTABLE ABDOMEN - 1 VIEW COMPARISON:  CT 01/14/2015 FINDINGS: No evidence of ileus, obstruction or visible free air. Moderate amount of fecal matter in the colon, within normal limits. Previous lumbosacral fusion. Extensive atherosclerosis with left iliac artery stent. Old compression deformities at L1, L2 and L4. IMPRESSION: No acute radiographic finding. Amount of fecal matter within normal limits. Electronically Signed   By: Nelson Chimes M.D.   On: 02/26/2015 11:40   I have personally reviewed and evaluated these images and lab results as part of my medical  decision-making.   EKG Interpretation   Date/Time:  Wednesday February 26 2015 08:39:03 EST Ventricular Rate:  60 PR Interval:  117 QRS Duration: 96 QT Interval:  460 QTC Calculation: 460 R Axis:   9 Text Interpretation:  Atrial-paced rhythm LVH with secondary  repolarization abnormality Anterior Q waves, possibly due to LVH ST  Elevation in anterior leads, unchanged from prior ECG Confirmed by  [facility] MD, [place] ([id number]) on 02/26/2015 8:52:54 AM      MDM   Final diagnoses:  Failure to thrive in adult  Dehydration, moderate  Acute on chronic kidney failure ([place])   81 year old female with a history of diabetes, CVA, pacemaker, CK D, hypertension, multiple visits to the emergency department in the last 10 days for concern of poor by mouth intake, generalized weakness presents with concern for patient not eating at home. Patient has had a head CT since the symptoms began which showed no acute abnormalities. Urinalysis pending.  Chest x-ray without signs of pneumonia CMP shows a BUN of 102, with a creatinine of 1.98 and GFR of 26.  Concern for nausea, significantly worsened BUN to 102 and low GFR for acute on chronic kidney failure with possible uremic symptoms.  Will admit for IV hydration, monitoring, discussion of goals of care.    Gareth Morgan, MD 02/27/15 1249

## 2015-02-26 NOTE — H&P (Signed)
[facility] History and Physical                                                                                    Carmen [name], is a 81 y.o. female  MRN: [id number]   DOB - 10/28/1933  Admit Date - 02/26/2015  Outpatient Primary MD for the patient is Carmen Greenland, MD  Referring Physician:  Dr. Billy [name], EDP  Chief Complaint:   Chief Complaint  Patient presents with  . Failure To Thrive  . poor appetite      HPI  Carmen [name]  is a 81 y.o. female, with HTN, PVD with claudication, CAD s/p pacemaker placement, DM presents to the ER for anorexia and weight loss.  The patient was hospitalized in November with acute kidney injury, acute enteritis, and constipation with stool impaction.  Over the next several weeks she began to feel poorly again, eating less and getting out of bed less.  She developed URI symptoms (cough and congestion). Over the last few days the patient stopped eating even though she was being fed by her family.  In the ER today her creatinine has climbed from 1.5 on 01/16/15 to 1.98 today (02/26/15).  U/A is positive for UTI. CXR is clear.  We will admit her to obs for hydration, treatment of UTI, Palliative Medicine Consultation.  The patient appears to be improving with IV fluids in the ER.  She has perked up and is now eating on her own.  She denies any pain.  Review of Systems  Constitutional: Positive for weight loss and malaise/fatigue.  HENT: Negative.   Eyes: Negative.   Respiratory: Positive for cough.   Cardiovascular: Negative.   Gastrointestinal: Negative.   Genitourinary: Negative.   Musculoskeletal: Negative.   Skin: Negative.   Neurological: Positive for weakness.  Endo/Heme/Allergies: Negative.   Psychiatric/Behavioral: Negative.      Past Medical History  Past Medical History  Diagnosis Date  . Hypertension   . Coronary artery disease   . Renal disorder   . Herniated lumbar intervertebral disc   . Cataract     "both eyes; blood  pressure's always too high to have them fixed" (09/11/2014)  . Renovascular hypertension      s/p left renal artery stent 12/2007.  S/P balloon angioplasty on 02/16/10 for ISR, BP was  controlled well since then.     . Claudication in peripheral vascular disease ([place])     02/16/10: Left CIA 9.0x28 Omnilink and REIA 8.0x40 seff expanding Zilver.   right subclavian artery stent 03/18/2008,  . Peripheral vascular disease ([place])   . Anxiety   . Pacemaker   . Pneumonia X 2  . IDDM (insulin dependent diabetes mellitus) ([place])   . Migraine     "used to have terrible migraines; stopped in the 1990's"  . Stroke [facility]) 1999    Stroke and TIA in 1999 with right sided weakness, now with residual right arm weakness (09/11/2014)  . Arthritis     "everywhere"  . Chronic lower back pain     Past Surgical History  Procedure Laterality Date  . Appendectomy    . Ureteral stent placement    .  Carotid endarterectomy Left 1991     Stroke and TIA in 1999 with right sided weakness, now with residual right arm weakness  . Lumbar laminectomy      'herniated disc"  . Back surgery    . Colonoscopy  07/30/2011    Procedure: COLONOSCOPY;  Surgeon: Inda Castle, MD;  Location: [place];  Service: Endoscopy;  Laterality: N/A;  gi bleed  . Esophagogastroduodenoscopy  07/30/2011    Procedure: ESOPHAGOGASTRODUODENOSCOPY (EGD);  Surgeon: Inda Castle, MD;  Location: [place];  Service: Endoscopy;  Laterality: N/A;  . Permanent pacemaker insertion Left 06/28/2011    Procedure: PERMANENT PACEMAKER INSERTION;  Surgeon: Deboraha Sprang, MD;  Location: [facility] CATH LAB;  Service: Cardiovascular;  Laterality: Left;  . Abdominal angiogram N/A 07/06/2011    Procedure: ABDOMINAL ANGIOGRAM;  Surgeon: Laverda Page, MD;  Location: [facility] CATH LAB;  Service: Cardiovascular;  Laterality: N/A;  . Renal angiogram N/A 07/06/2011    Procedure: RENAL ANGIOGRAM;  Surgeon: Laverda Page, MD;  Location: [facility] CATH LAB;  Service:  Cardiovascular;  Laterality: N/A;  . Lower extremity angiogram Right 05/29/2012    Procedure: LOWER EXTREMITY ANGIOGRAM;  Surgeon: Laverda Page, MD;  Location: [facility] CATH LAB;  Service: Cardiovascular;  Laterality: Right;  . Tonsillectomy    . Insert / replace / remove pacemaker    . Excisional hemorrhoidectomy    . Carpal tunnel release Left   . Vaginal hysterectomy    . Peripheral vascular catheterization N/A 11/05/2014    Procedure: Abdominal Aortogram;  Surgeon: Serafina Mitchell, MD;  Location: [place] CV LAB;  Service: Cardiovascular;  Laterality: N/A;      Social History Social History  Substance Use Topics  . Smoking status: Current Every Day Smoker -- 0.00 packs/day for 70 years    Types: Cigarettes  . Smokeless tobacco: Never Used     Comment: 09/11/2014 "I used to smoke very heavy; down to 1 cigarette/day"  . Alcohol Use: No     Comment: "stopped drinking back in the 1970's"    Family History Family History  Problem Relation Age of Onset  . Cancer Father   . Heart attack Mother     Prior to Admission medications   Medication Sig Start Date End Date Taking? Authorizing Provider  aspirin EC 81 MG tablet Take 81 mg by mouth daily.   Yes Historical Provider, MD  benazepril (LOTENSIN) 40 MG tablet Take 40 mg by mouth daily. 01/28/15  Yes Historical Provider, MD  clopidogrel (PLAVIX) 75 MG tablet Take 75 mg by mouth daily.   Yes Historical Provider, MD  feeding supplement, GLUCERNA SHAKE, (GLUCERNA SHAKE) LIQD Take 237 mLs by mouth 2 (two) times daily between meals. 01/16/15  Yes Belkys A Regalado, MD  furosemide (LASIX) 20 MG tablet Take 20 mg by mouth 2 (two) times daily. 01/28/15  Yes Historical Provider, MD  guaiFENesin (MUCINEX) 600 MG 12 hr tablet Take 1 tablet (600 mg total) by mouth 2 (two) times daily as needed. 02/15/15  Yes Dorie Rank, MD  hydrALAZINE (APRESOLINE) 100 MG tablet Take 1 tablet (100 mg total) by mouth 4 (four) times daily. 01/16/15  Yes Belkys A  Regalado, MD  linagliptin (TRADJENTA) 5 MG TABS tablet Take 1 tablet (5 mg total) by mouth daily. 12/01/14  Yes Lauree Chandler, NP  lisinopril (PRINIVIL,ZESTRIL) 10 MG tablet Take 10 mg by mouth daily. 02/04/15  Yes Historical Provider, MD  metoprolol tartrate (LOPRESSOR) 25 MG tablet Take 25 mg  by mouth 2 (two) times daily.   Yes Historical Provider, MD  Omega-3 Fatty Acids (FISH OIL) 1000 MG CAPS Take 1,000 mg by mouth 2 (two) times daily.    Yes Historical Provider, MD  pantoprazole (PROTONIX) 40 MG tablet Take 1 tablet (40 mg total) by mouth daily. 01/16/15  Yes Belkys A Regalado, MD  potassium chloride (K-DUR) 10 MEQ tablet Take 20 mEq by mouth daily. Take with food 01/28/15  Yes Historical Provider, MD  isosorbide mononitrate (IMDUR) 120 MG 24 hr tablet Take 1 tablet (120 mg total) by mouth daily. 12/12/12   Annita Brod, MD    Allergies  Allergen Reactions  . Norvasc [Amlodipine Besylate] Swelling  . Peanut-Containing Drug Products Other (See Comments)    Stomach pain    Physical Exam  Vitals  Blood pressure 159/70, pulse 60, temperature 97.4 F (36.3 C), temperature source Oral, resp. rate 15, SpO2 99 %.   General:  Frail, cachectic, tired appearing  lying in bed in NAD, daughters at bedside  Psych:  Normal affect and insight, Not Suicidal or Homicidal, Awake Alert, Oriented X 3.  Neuro:   No F.N deficits, ALL C.Nerves Intact, Strength 5/5 all 4 extremities, Sensation intact all 4 extremities.  ENT:  Ears and Eyes appear Normal, Conjunctivae clear, PER. Moist oral mucosa without erythema or exudates.  Neck:  Supple, No lymphadenopathy appreciated  Respiratory:  Symmetrical chest wall movement, Good air movement bilaterally, CTAB. Patient has a weak gurgly cough-unable to bring up secretions  Cardiac:  RRR, No Murmurs, no LE edema noted, no JVD.    Abdomen:  Positive bowel sounds, Soft, Non tender, Non distended,  No masses appreciated  Skin:  No Cyanosis, + +  tenting, No Skin Rash or Bruise.  Extremities:  Able to move all 4. 5/5 strength in each,  no effusions.  Data Review  Wt Readings from Last 3 Encounters:  02/22/15 45.36 kg (100 lb)  02/15/15 46.1 kg (101 lb 10.1 oz)  01/15/15 46.085 kg (101 lb 9.6 oz)    CBC  Recent Labs Lab 02/22/15 1837 02/26/15 0858  WBC 3.7* 4.2  HGB 14.3 14.7  HCT 42.0 43.9  PLT 243 202  MCV 95.9 99.1  MCH 32.6 33.2  MCHC 34.0 33.5  RDW 12.7 13.1  LYMPHSABS 1.4 1.3  MONOABS 0.3 0.3  EOSABS 0.0 0.1  BASOSABS 0.0 0.0    Chemistries   Recent Labs Lab 02/22/15 2020 02/26/15 0858  NA 132* 140  K 5.0 5.1  CL 103 106  CO2 20* 24  GLUCOSE 242* 204*  BUN 65* 102*  CREATININE 1.74* 1.98*  CALCIUM 9.1 9.4  MG  --  2.5*  AST 27 45*  ALT 22 38  ALKPHOS 73 93  BILITOT 0.7 0.5      Lab Results  Component Value Date   HGBA1C 8.7* 09/11/2014    CREATININE: 1.98 mg/dL ABNORMAL (02/26/15 0858) Estimated creatinine clearance - 16.5 mL/min    Urinalysis    Component Value Date/Time   COLORURINE YELLOW 02/26/2015 1049   APPEARANCEUR HAZY* 02/26/2015 1049   LABSPEC 1.010 02/26/2015 1049   PHURINE 5.0 02/26/2015 1049   GLUCOSEU NEGATIVE 02/26/2015 1049   HGBUR NEGATIVE 02/26/2015 1049   BILIRUBINUR NEGATIVE 02/26/2015 1049   KETONESUR NEGATIVE 02/26/2015 1049   PROTEINUR 30* 02/26/2015 1049   UROBILINOGEN 0.2 01/13/2015 2034   NITRITE NEGATIVE 02/26/2015 1049   LEUKOCYTESUR SMALL* 02/26/2015 1049    Imaging results:   Dg Chest 2 View  02/26/2015  CLINICAL DATA:  Cough and congestion EXAM: CHEST  2 VIEW COMPARISON:  February 22, 2015 FINDINGS: There is no edema or consolidation. Heart is upper normal in size with pulmonary vascularity within normal limits. Pacemaker leads are attached to the right atrium and right ventricle. There is calcification in the aorta. There is a stent in the proximal right subclavian artery region. No adenopathy. Bones appear osteoporotic. There is  arthropathy in the right shoulder. IMPRESSION: No edema or consolidation. Electronically Signed   By: Lowella Grip III M.D.   On: 02/26/2015 09:18   Dg Chest 2 View  02/22/2015  CLINICAL DATA:  Cough and weakness. EXAM: CHEST  2 VIEW COMPARISON:  02/15/2015 FINDINGS: Left chest wall pacer device is noted with leads in the right atrial appendage and right ventricle. Aortic atherosclerosis noted. Mild cardiac enlargement. No pleural effusion or edema. IMPRESSION: 1. Mild cardiac enlargement. 2. Aortic atherosclerosis Electronically Signed   By: Kerby Moors M.D.   On: 02/22/2015 18:57   Dg Chest 2 View  02/15/2015  CLINICAL DATA:  Left-sided chest pain and shortness of Breath for 3-4 weeks, history of tobacco use EXAM: CHEST - 2 VIEW COMPARISON:  01/13/2015 FINDINGS: Cardiac shadow is stable. The lungs are well aerated bilaterally. A pacing device is again seen and stable. No focal bony abnormality is seen. IMPRESSION: No acute abnormality noted. Electronically Signed   By: Inez Catalina M.D.   On: 02/15/2015 16:05   Ct Head Wo Contrast  02/15/2015  CLINICAL DATA:  81 year old with personal history of stroke, presenting with weakness over the past several days, with difficulty moving and straightening the right arm. EXAM: CT HEAD WITHOUT CONTRAST TECHNIQUE: Contiguous axial images were obtained from the base of the skull through the vertex without intravenous contrast. COMPARISON:  Multiple prior head CTs dating back to 04/11/2009, most recently 10/28/2014. FINDINGS: Moderate cortical and deep atrophy and mild cerebellar atrophy, stable dating back to 2013 but slightly progressive since 2011. Old lacunar strokes involving the left basal ganglia, unchanged. Asymmetric enlargement of the frontal horn of the left lateral ventricle, unchanged, due to encephalomalacia in the adjacent left frontal lobe at the site of a previously thrombosed AVM, with associated mild dystrophic calcification. Moderate  changes of small vessel disease of the white matter diffusely, unchanged. No mass lesion. No midline shift. No acute hemorrhage or hematoma. No extra-axial fluid collections. No evidence of acute infarction. No skull fracture or other focal osseous abnormality involving the skull. Mucosal thickening involving the maxillary sinuses, opacification of multiple bilateral ethmoid air cells and mucosal thickening with air-fluid levels in the sphenoid sinuses bilaterally. Bilateral mastoid air cells and middle ear cavities well-aerated. Severe bilateral carotid siphon and left vertebral artery atherosclerosis. IMPRESSION: 1. No acute intracranial abnormality. 2. Stable moderate generalized atrophy, moderate chronic microvascular ischemic changes of the white matter, old lacunar strokes in the left basal ganglia, and encephalomalacia in the left frontal lobe white matter. 3. Chronic bilateral maxillary, ethmoid and sphenoid sinus disease with superimposed acute disease in the sphenoid sinuses. Electronically Signed   By: Evangeline Dakin M.D.   On: 02/15/2015 18:15    My personal review of EKG: Atrial paced rhythm   Assessment & Plan  Principal Problem:   Renal failure (ARF), acute on chronic (HCC) Active Problems:   Cardiac pacemaker for history of intermittent heart block   Pacemaker-[employer]   CKD (chronic kidney disease), stage III   HTN (hypertension)   Dehydration   Protein-calorie malnutrition,  severe (HCC)   Dehydration, moderate   Failure to thrive in adult   Acute kidney injury with underlying CKD Patient with baseline creatinine of 1.5. This is slowly climbed to 1.98 today.  We'll hold Ace Inhibitors (?  Both lisinopril and benazepril?) and Lasix.  Gently hydrate.   UTI Many bacteria.  Will culture.  Start Rocephin.   Weight loss / Failure to thrive/severe protein calorie malnutrition Per the family the patient has lost up to 20 pounds in the past 1-2 weeks. While our  inpatient scales may not be accurate records indicate that the patient weighed 101 on 11/9 and that she weighs 100 pounds today. Regardless she appears malnourished and cachectic.  We will request a nutrition evaluation.   Possible chronic pancreatitis On CT abdomen pelvis done in November she had pancreatic calcifications and atrophy consistent with chronic pancreatitis. The patient is not currently complaining of abdominal pain. However she does not to eat. We will check a lipase.  It is possible that the patient is on a slow decline towards death, but I want to be certain that we treat the treatable if she has chronic pancreatitis pain.  Will start on empiric creon to see if it helps her eat.  If she is discharged on pancreatic enzymes - consider Zenpep - as it may be more affordable than creon.   Diabetes Mellitus Hold Tradjenta.  Start SSI - Sensitive inpatient.  Will allow CBG to ride a little high -- 150.   Hypertension Currently controlled. Will hold Lasix, Ace inhibitors as well as her metoprolol as she is slightly bradycardic. We'll continue Imdur and hydralazine.  Would add back metoprolol as soon as appropriate.  ** Please ensure that the patient does not go home on two Ace Inhibitors **.  Med rec indicates she is on both lisinopril and benazepril.   Grade 1 diastolic dysfunction Based on echocardiogram from 04/06/2013. LVEF was 65-70%   Disposition:  Patient will be discharged to home with daughter when appropriate regardless of findings.  May need Palliative or Hospice support.  Family is leaning toward more comfort services for this precious lady rather than aggressive care.   Consultants Called:    none  Family Communication:     Spoke with patient's dtr and grand dtrs multiple times at bedside.  Code Status:    DNR  Condition:    Weak, but stable.  FTT.  Potential Disposition:   To home with DTR.  Will likely need either HHRN or Hospice.  Time spent in minutes :  [place],  [place] on 02/26/2015 at 11:33 AM Between 7am to 7pm - Pager - [phone] After 7pm go to [email or web address] - password [email or web address] And look for the night coverage person covering me after hours

## 2015-02-27 DIAGNOSIS — I639 Cerebral infarction, unspecified: Secondary | ICD-10-CM | POA: Diagnosis not present

## 2015-02-27 DIAGNOSIS — N179 Acute kidney failure, unspecified: Secondary | ICD-10-CM | POA: Diagnosis not present

## 2015-02-27 DIAGNOSIS — K5900 Constipation, unspecified: Secondary | ICD-10-CM | POA: Diagnosis not present

## 2015-02-27 DIAGNOSIS — E1151 Type 2 diabetes mellitus with diabetic peripheral angiopathy without gangrene: Secondary | ICD-10-CM

## 2015-02-27 DIAGNOSIS — Z515 Encounter for palliative care: Secondary | ICD-10-CM

## 2015-02-27 DIAGNOSIS — N189 Chronic kidney disease, unspecified: Secondary | ICD-10-CM

## 2015-02-27 DIAGNOSIS — Z7189 Other specified counseling: Secondary | ICD-10-CM

## 2015-02-27 DIAGNOSIS — N3000 Acute cystitis without hematuria: Secondary | ICD-10-CM | POA: Diagnosis not present

## 2015-02-27 DIAGNOSIS — K5909 Other constipation: Secondary | ICD-10-CM | POA: Diagnosis not present

## 2015-02-27 DIAGNOSIS — R627 Adult failure to thrive: Secondary | ICD-10-CM | POA: Diagnosis not present

## 2015-02-27 DIAGNOSIS — E1165 Type 2 diabetes mellitus with hyperglycemia: Secondary | ICD-10-CM

## 2015-02-27 DIAGNOSIS — N183 Chronic kidney disease, stage 3 (moderate): Secondary | ICD-10-CM | POA: Diagnosis not present

## 2015-02-27 LAB — BASIC METABOLIC PANEL
ANION GAP: 9 (ref 5–15)
Anion gap: 11 (ref 5–15)
Anion gap: 8 (ref 5–15)
BUN: 88 mg/dL — ABNORMAL HIGH (ref 6–20)
BUN: 85 mg/dL — ABNORMAL HIGH (ref 6–20)
BUN: 86 mg/dL — AB (ref 6–20)
CHLORIDE: 109 mmol/L (ref 101–111)
CO2: 16 mmol/L — ABNORMAL LOW (ref 22–32)
CO2: 18 mmol/L — ABNORMAL LOW (ref 22–32)
CO2: 19 mmol/L — ABNORMAL LOW (ref 22–32)
Calcium: 8.2 mg/dL — ABNORMAL LOW (ref 8.9–10.3)
Calcium: 8.4 mg/dL — ABNORMAL LOW (ref 8.9–10.3)
Calcium: 8.4 mg/dL — ABNORMAL LOW (ref 8.9–10.3)
Chloride: 103 mmol/L (ref 101–111)
Chloride: 108 mmol/L (ref 101–111)
Creatinine, Ser: 2.28 mg/dL — ABNORMAL HIGH (ref 0.44–1.00)
Creatinine, Ser: 2.17 mg/dL — ABNORMAL HIGH (ref 0.44–1.00)
Creatinine, Ser: 2.16 mg/dL — ABNORMAL HIGH (ref 0.44–1.00)
GFR calc Af Amer: 22 mL/min — ABNORMAL LOW (ref >60)
GFR calc Af Amer: 24 mL/min — ABNORMAL LOW (ref >60)
GFR calc Af Amer: 24 mL/min — ABNORMAL LOW (ref >60)
GFR calc non Af Amer: 19 mL/min — ABNORMAL LOW (ref >60)
GFR calc non Af Amer: 20 mL/min — ABNORMAL LOW (ref >60)
GFR calc non Af Amer: 21 mL/min — ABNORMAL LOW (ref >60)
Glucose, Bld: 563 mg/dL (ref 65–99)
Glucose, Bld: 177 mg/dL — ABNORMAL HIGH (ref 65–99)
Glucose, Bld: 148 mg/dL — ABNORMAL HIGH (ref 65–99)
POTASSIUM: 4.5 mmol/L (ref 3.5–5.1)
Potassium: 5.6 mmol/L — ABNORMAL HIGH (ref 3.5–5.1)
Potassium: 5.1 mmol/L (ref 3.5–5.1)
SODIUM: 136 mmol/L (ref 135–145)
Sodium: 130 mmol/L — ABNORMAL LOW (ref 135–145)
Sodium: 135 mmol/L (ref 135–145)

## 2015-02-27 LAB — HEMOGLOBIN A1C
Hgb A1c MFr Bld: 7.6 % — ABNORMAL HIGH (ref 4.8–5.6)
Mean Plasma Glucose: 171 mg/dL

## 2015-02-27 LAB — GLUCOSE, CAPILLARY
GLUCOSE-CAPILLARY: 281 mg/dL — AB (ref 65–99)
GLUCOSE-CAPILLARY: 188 mg/dL — AB (ref 65–99)
GLUCOSE-CAPILLARY: 104 mg/dL — AB (ref 65–99)
GLUCOSE-CAPILLARY: 67 mg/dL (ref 65–99)
GLUCOSE-CAPILLARY: 92 mg/dL (ref 65–99)
GLUCOSE-CAPILLARY: 148 mg/dL — AB (ref 65–99)
GLUCOSE-CAPILLARY: 217 mg/dL — AB (ref 65–99)
GLUCOSE-CAPILLARY: 211 mg/dL — AB (ref 65–99)
Glucose-Capillary: 393 mg/dL — ABNORMAL HIGH (ref 65–99)
Glucose-Capillary: 115 mg/dL — ABNORMAL HIGH (ref 65–99)
Glucose-Capillary: 136 mg/dL — ABNORMAL HIGH (ref 65–99)
Glucose-Capillary: 149 mg/dL — ABNORMAL HIGH (ref 65–99)
Glucose-Capillary: 156 mg/dL — ABNORMAL HIGH (ref 65–99)
Glucose-Capillary: 187 mg/dL — ABNORMAL HIGH (ref 65–99)
Glucose-Capillary: 111 mg/dL — ABNORMAL HIGH (ref 65–99)
Glucose-Capillary: 215 mg/dL — ABNORMAL HIGH (ref 65–99)

## 2015-02-27 MED ORDER — INSULIN ASPART 100 UNIT/ML ~~LOC~~ SOLN: 0.0000 [IU] | Freq: Three times a day (TID) | SUBCUTANEOUS | Status: DC

## 2015-02-27 MED ORDER — BISACODYL 10 MG RE SUPP: 10.0000 mg | Freq: Every day | RECTAL | Status: DC | PRN

## 2015-02-27 MED ORDER — GLUCERNA SHAKE PO LIQD
237.0000 mL | Freq: Three times a day (TID) | ORAL | Status: DC
  Administered 2015-02-27 – 2015-02-28 (×2): 237 mL via ORAL

## 2015-02-27 MED ORDER — POLYETHYLENE GLYCOL 3350 17 G PO PACK
17.0000 g | PACK | Freq: Two times a day (BID) | ORAL | Status: DC
  Administered 2015-02-27 – 2015-03-01 (×5): 17 g via ORAL
  Filled 2015-02-27 (×5): qty 1

## 2015-02-27 MED ORDER — INSULIN ASPART 100 UNIT/ML ~~LOC~~ SOLN
0.0000 [IU] | Freq: Three times a day (TID) | SUBCUTANEOUS | Status: DC
  Administered 2015-02-27 (×2): 3 [IU] via SUBCUTANEOUS
  Administered 2015-02-28: 1 [IU] via SUBCUTANEOUS
  Administered 2015-02-28: 2 [IU] via SUBCUTANEOUS
  Administered 2015-02-28: 9 [IU] via SUBCUTANEOUS

## 2015-02-27 MED ORDER — DEXTROSE 50 % IV SOLN
13.0000 mL | Freq: Once | INTRAVENOUS | Status: AC
  Administered 2015-02-27: 13 mL via INTRAVENOUS

## 2015-02-27 MED ORDER — INSULIN ASPART 100 UNIT/ML ~~LOC~~ SOLN
0.0000 [IU] | Freq: Every day | SUBCUTANEOUS | Status: DC
  Administered 2015-02-27: 2 [IU] via SUBCUTANEOUS

## 2015-02-27 MED ORDER — INSULIN ASPART 100 UNIT/ML ~~LOC~~ SOLN: 0.0000 [IU] | Freq: Every day | SUBCUTANEOUS | Status: DC

## 2015-02-27 MED ORDER — DEXTROSE 50 % IV SOLN
INTRAVENOUS | Status: AC
  Filled 2015-02-27: qty 50

## 2015-02-27 MED ORDER — INSULIN DETEMIR 100 UNIT/ML ~~LOC~~ SOLN
4.0000 [IU] | Freq: Every day | SUBCUTANEOUS | Status: DC
  Filled 2015-02-27: qty 0.04

## 2015-02-27 MED ORDER — SODIUM CHLORIDE 0.9 % IV SOLN
INTRAVENOUS | Status: DC
  Administered 2015-02-27 – 2015-02-28 (×3): via INTRAVENOUS

## 2015-02-27 MED ORDER — INSULIN DETEMIR 100 UNIT/ML ~~LOC~~ SOLN
4.0000 [IU] | Freq: Every day | SUBCUTANEOUS | Status: DC
  Administered 2015-02-27 – 2015-02-28 (×2): 4 [IU] via SUBCUTANEOUS
  Filled 2015-02-27 (×3): qty 0.04

## 2015-02-27 NOTE — Progress Notes (Signed)
Initial Nutrition Assessment  DOCUMENTATION CODES:   Severe malnutrition in context of chronic illness  INTERVENTION:   -Increase Glucerna shake to TID, each supplement provides 220 kcal and 10 grams of protein -Consider transition to Ensure Enlive supplement (due to increased nutrient density), especially if PO intake continues to decline  NUTRITION DIAGNOSIS:   Malnutrition related to chronic illness as evidenced by severe depletion of body fat, severe depletion of muscle mass, percent weight loss.  GOAL:   Patient will meet greater than or equal to 90% of their needs  MONITOR:   PO intake, Labs, Weight trends, Skin, I & O's  REASON FOR ASSESSMENT:   Consult Assessment of nutrition requirement/status  ASSESSMENT:   MERYN STYER is a 81 y.o. female with a Past Medical History of hypertension, CAD, C KD, PVD, pacemaker insertion, DM, CVA who presents with failure to thrive and UTI. Significant time will likely need to be made for end-of-life and goals of care planning. Family very involved.  Pt admitted with failure to thrive and UTI.   Pt is familiar to this RD due to multiple previous admissions. Pt has a hx of severe malnutrition, which is ongoing. Nutrition-focused physical exam completed on 01/14/15, which revealed severe fat and muscle depletion. Noted pt has experienced an 18% wt loss over the past 6 months. This RD does not suspect changes in physical exam at this time.  SLP following for dysphagia; recommending dysphagia 3 diet with thin liquids. Pt with no signs of symptoms of aspiration, however, mastication is laborious as pt does not typically use dentures when consuming foods. Meal completion poor (25%). Pt is taking Glucerna supplements well. Noted pt takes Ensure supplements at home.   PCT and hospice services following. Awaiting eligibility for hospice services; plan to d/c home with hospice services once medically stable. Due to EOL trajectory, suspect  nutritional status will continue to decline.   Labs reviewed. CBGS: 111-156.   Diet Order:  DIET DYS 3 Room service appropriate?: Yes; Fluid consistency:: Thin  Skin:  Reviewed, no issues  Last BM:  02/26/15  Height:   Ht Readings from Last 1 Encounters:  02/22/15 5\' 4"  (1.626 m)    Weight:   Wt Readings from Last 1 Encounters:  02/27/15 90 lb 8 oz (41.051 kg)    Ideal Body Weight:  54.5 kg  BMI:  Body mass index is 15.53 kg/(m^2).  Estimated Nutritional Needs:   Kcal:  1200-1400  Protein:  45-55 grams  Fluid:  1.2-1.4 L  EDUCATION NEEDS:   No education needs identified at this time  Nezzie Manera A. Jimmye Norman, RD, LDN, CDE Pager: [phone] After hours Pager: [phone]

## 2015-02-27 NOTE — Progress Notes (Signed)
Occupational Therapy Evaluation Patient Details Name: Carmen [name] MRN: [id number] DOB: March 13, 1933 Today's Date: 02/27/2015    History of Present Illness 81 y.o. female admitted to [facility] on 02/26/15 for UTI, FTT/weakness, and possible chronic pancreatitis.  Pt with significant PMhx of HTN, CAD, renal disorder, herniated lumbar disc, back surgery, claudication/PVD, pacemaker, DM, and stroke with R sided residual deficits.   Clinical Impression   Pt overall supervision to minguard with ADL activities. Most likely pt's baseline level of function. Will need 24/7 S at D/C and S with all mobility and ADL. OT signing off.     Follow Up Recommendations  No OT follow up;Supervision/Assistance - 24 hour    Equipment Recommendations  None recommended by OT    Recommendations for Other Services       Precautions / Restrictions Precautions Precautions: Fall Precaution Comments: pt reports no h/o falls, but unsteady on her feet.       Mobility Bed Mobility Overal bed mobility: Needs Assistance Bed Mobility: Supine to Sit     Supine to sit: Supervision;HOB elevated     General bed mobility comments: HOb elevated and pt relying heavily on railing for leverage at trunk.   Transfers Overall transfer level: Needs assistance Equipment used: 1 person hand held assist Transfers: Sit to/from Stand Sit to Stand: Supervision         General transfer comment: Pt furniture walking during all mobility and transfers    Balance Overall balance assessment: Needs assistance Sitting-balance support: Feet supported;No upper extremity supported Sitting balance-Leahy Scale: Good     Standing balance support: Single extremity supported;Bilateral upper extremity supported;No upper extremity supported Standing balance-Leahy Scale: Fair                              ADL Overall ADL's : At baseline                                       General ADL Comments: Pt  appears close to baseline level of functiona for ADL. Family will be able to assist as needed per pt     Vision     Perception     Praxis      Pertinent Vitals/Pain Pain Assessment: No/denies pain     Hand Dominance Right ("but I use my left hand all the time" pt reports stroke R)   Extremity/Trunk Assessment Upper Extremity Assessment Upper Extremity Assessment: Generalized weakness;RUE deficits/detail RUE Deficits / Details: residual deficits from CVA. Uses as functional assist   Lower Extremity Assessment Lower Extremity Assessment: Generalized weakness   Cervical / Trunk Assessment Cervical / Trunk Assessment: Other exceptions Cervical / Trunk Exceptions: h/o back surgery and chronic back pain.    Communication Communication Communication: No difficulties   Cogni Arousal/Alertness: Awake/alert Behavior During Therapy: WFL for tasks assessed/performed Overall Cognitive Status: No family/caregiver present to determine baseline cognitive functioning   most likely baseline     Memory: Decreased short-term memory             General Comments       Exercises       Shoulder Instructions      Home Living Family/patient expects to be discharged to:: Private residence Living Arrangements: Children;Spouse/significant other (son, grandchild- 50 something) Available Help at Discharge: Family;Available 24 hours/day Type of Home: House Home Access: Stairs to enter Entrance  Stairs-Number of Steps: 2 Entrance Stairs-Rails: Left (wall on the right side) Home Layout: One level     Bathroom Shower/Tub: Tub/shower unit;Door   Bathroom Toilet: Handicapped height Bathroom Accessibility: Yes   Home Equipment: Environmental consultant - 2 wheels;Cane - single point;Bedside commode;Hand held shower head;Tub bench   Additional Comments: gave OT the same information that she told PT      Prior Functioning/Environment Level of Independence: Independent        Comments: per pt  report she needed closer supervision for her activities, but no physical assist; states she fixes herself toast in teh middle of the night. (states her family is "watching her")    OT Diagnosis: Generalized weakness   OT Problem List: Decreased strength;Impaired balance (sitting and/or standing);Decreased safety awareness   OT Treatment/Interventions:      OT Goals(Current goals can be found in the care plan section) Acute Rehab OT Goals Patient Stated Goal: to go home with family OT Goal Formulation: All assessment and education complete, DC therapy  OT Frequency:     Barriers to D/C:            Co-evaluation              End of Session Nurse Communication: Mobility status  Activity Tolerance: Patient tolerated treatment well Patient left: in bed;with call bell/phone within reach;with bed alarm set   Time: [id number] OT Time Calculation (min): 12 min Charges:  OT General Charges $OT Visit: 1 Procedure OT Evaluation $Initial OT Evaluation Tier I: 1 Procedure G-Codes: OT G-codes **NOT FOR INPATIENT CLASS** Functional Assessment Tool Used: clinical judgement Functional Limitation: Self care Self Care Current Status [id number]): At least 1 percent but less than 20 percent impaired, limited or restricted Self Care Goal Status [id number]): At least 1 percent but less than 20 percent impaired, limited or restricted Self Care Discharge Status [phone]): At least 1 percent but less than 20 percent impaired, limited or restricted  Carmen [name],Carmen [name] 02/27/2015, 5:09 PM   [facility], OTR/L  [phone] 02/27/2015

## 2015-02-27 NOTE — Evaluation (Signed)
Clinical/Bedside Swallow Evaluation Patient Details  Name: Carmen [name] MRN: [id number] Date of Birth: 1933-06-17  Today's Date: 02/27/2015 Time: SLP Start Time (ACUTE ONLY): 66 SLP Stop Time (ACUTE ONLY): 1048 SLP Time Calculation (min) (ACUTE ONLY): 12 min  Past Medical History:  Past Medical History  Diagnosis Date  . Hypertension   . Coronary artery disease   . Renal disorder   . Herniated lumbar intervertebral disc   . Cataract     "both eyes; blood pressure's always too high to have them fixed" (09/11/2014)  . Renovascular hypertension      s/p left renal artery stent 12/2007.  S/P balloon angioplasty on 02/16/10 for ISR, BP was  controlled well since then.     . Claudication in peripheral vascular disease ([employer])     02/16/10: Left CIA 9.0x28 Omnilink and REIA 8.0x40 seff expanding Zilver.   right subclavian artery stent 03/18/2008,  . Peripheral vascular disease ([place])   . Anxiety   . Pacemaker   . Pneumonia X 2  . IDDM (insulin dependent diabetes mellitus) ([place])   . Migraine     "used to have terrible migraines; stopped in the 1990's"  . Stroke [facility]) 1999    Stroke and TIA in 1999 with right sided weakness, now with residual right arm weakness (09/11/2014)  . Arthritis     "everywhere"  . Chronic lower back pain   . UTI (lower urinary tract infection) 02/2015   Past Surgical History:  Past Surgical History  Procedure Laterality Date  . Appendectomy    . Ureteral stent placement    . Carotid endarterectomy Left 1991     Stroke and TIA in 1999 with right sided weakness, now with residual right arm weakness  . Lumbar laminectomy      'herniated disc"  . Back surgery    . Colonoscopy  07/30/2011    Procedure: COLONOSCOPY;  Surgeon: Inda Castle, MD;  Location: [place];  Service: Endoscopy;  Laterality: N/A;  gi bleed  . Esophagogastroduodenoscopy  07/30/2011    Procedure: ESOPHAGOGASTRODUODENOSCOPY (EGD);  Surgeon: Inda Castle, MD;  Location: [place];   Service: Endoscopy;  Laterality: N/A;  . Permanent pacemaker insertion Left 06/28/2011    Procedure: PERMANENT PACEMAKER INSERTION;  Surgeon: Deboraha Sprang, MD;  Location: [facility] CATH LAB;  Service: Cardiovascular;  Laterality: Left;  . Abdominal angiogram N/A 07/06/2011    Procedure: ABDOMINAL ANGIOGRAM;  Surgeon: Laverda Page, MD;  Location: [facility] CATH LAB;  Service: Cardiovascular;  Laterality: N/A;  . Renal angiogram N/A 07/06/2011    Procedure: RENAL ANGIOGRAM;  Surgeon: Laverda Page, MD;  Location[facility] CATH LAB;  Service: Cardiovascular;  Laterality: N/A;  . Lower extremity angiogram Right 05/29/2012    Procedure: LOWER EXTREMITY ANGIOGRAM;  Surgeon: Laverda Page, MD;  Location: [facility] CATH LAB;  Service: Cardiovascular;  Laterality: Right;  . Tonsillectomy    . Insert / replace / remove pacemaker    . Excisional hemorrhoidectomy    . Carpal tunnel release Left   . Vaginal hysterectomy    . Peripheral vascular catheterization N/A 11/05/2014    Procedure: Abdominal Aortogram;  Surgeon: Serafina Mitchell, MD;  Location: [place] CV LAB;  Service: Cardiovascular;  Laterality: N/A;   HPI:  Carmen [name] is a 81 y.o. female, with HTN, PVD with claudication, CAD s/p pacemaker placement, DM presents to the ER for anorexia and weight loss. The patient was hospitalized in November with acute kidney injury, acute enteritis, and  constipation with stool impaction. Over the next several weeks she began to feel poorly again, eating less and getting out of bed less. She developed URI symptoms (cough and congestion). Over the last few days the patient stopped eating even though she was being fed by her family. In the ER today her creatinine has climbed from 1.5 on 01/16/15 to 1.98 today (02/26/15). U/A is positive for UTI. CXR is clear. We will admit her to obs for hydration, treatment of UTI, Palliative Medicine Consultation. The patient appears to be improving with IV fluids in the ER. She has  perked up and started eating on her own per chart.Pt has no SLP notes in record.    Assessment / Plan / Recommendation Clinical Impression  Pt demonstrates normal swallow function with no signs of aspiration. She is edentulous and does not wear dentures while eating, though she has them "for going to church." Mastication observed to be slow and laborious, though functional. Recommend a mechanical soft diet (dys 3) to facilitate intake. No SLP f/u needed, will sign off.     Aspiration Risk  Mild aspiration risk    Diet Recommendation Dysphagia 3 (Mech soft);Thin liquid   Liquid Administration via: Cup;Straw Medication Administration: Whole meds with liquid Supervision: Patient able to self feed Postural Changes: Seated upright at 90 degrees    Other  Recommendations     Follow up Recommendations  None    Frequency and Duration            Prognosis        Swallow Study   General HPI: Carmen [name] is a 81 y.o. female, with HTN, PVD with claudication, CAD s/p pacemaker placement, DM presents to the ER for anorexia and weight loss. The patient was hospitalized in November with acute kidney injury, acute enteritis, and constipation with stool impaction. Over the next several weeks she began to feel poorly again, eating less and getting out of bed less. She developed URI symptoms (cough and congestion). Over the last few days the patient stopped eating even though she was being fed by her family. In the ER today her creatinine has climbed from 1.5 on 01/16/15 to 1.98 today (02/26/15). U/A is positive for UTI. CXR is clear. We will admit her to obs for hydration, treatment of UTI, Palliative Medicine Consultation. The patient appears to be improving with IV fluids in the ER. She has perked up and started eating on her own per chart.Pt has no SLP notes in record.  Type of Study: Bedside Swallow Evaluation Previous Swallow Assessment: none Diet Prior to this Study: NPO Temperature  Spikes Noted: No Respiratory Status: Room air History of Recent Intubation: No Behavior/Cognition: Alert;Cooperative;Pleasant mood Oral Cavity Assessment: Within Functional Limits Oral Care Completed by SLP: No Oral Cavity - Dentition: Edentulous;Dentures, not available Vision: Functional for self-feeding Self-Feeding Abilities: Able to feed self Patient Positioning: Upright in bed Baseline Vocal Quality: Normal Volitional Cough: Strong Volitional Swallow: Able to elicit    Oral/Motor/Sensory Function Overall Oral Motor/Sensory Function: Within functional limits   Ice Chips     Thin Liquid Thin Liquid: Within functional limits    Nectar Thick     Honey Thick     Puree Puree: Within functional limits   Solid Solid: Impaired Presentation: Self Fed Oral Phase Functional Implications: Impaired mastication;Prolonged oral transit      Herbie Baltimore, MA CCC-SLP [phone]  Karrin Eisenmenger, Katherene Ponto 02/27/2015,11:44 AM

## 2015-02-27 NOTE — Progress Notes (Signed)
Inpatient Diabetes Program Recommendations  AACE/ADA: New Consensus Statement on Inpatient Glycemic Control (2015)  Target Ranges:  Prepandial:   less than 140 mg/dL      Peak postprandial:   less than 180 mg/dL (1-2 hours)      Critically ill patients:  140 - 180 mg/dL  Results for Carmen [name], Carmen [name] (MRN [id number]) as of 02/27/2015 11:10  Ref. Range 02/27/2015 06:44 02/27/2015 07:45 02/27/2015 08:52 02/27/2015 09:47 02/27/2015 10:47  Glucose-Capillary Latest Ref Range: 65-99 mg/dL 115 (H) 136 (H) 149 (H) 148 (H) 156 (H)   Review of Glycemic Control  Diabetes history: DM2 Outpatient Diabetes medications: Tradjenta 5 mg daily Current orders for Inpatient glycemic control: Novolin R insulin drip  Inpatient Diabetes Program Recommendations: Insulin - Basal: At time of transition from IV to SQ insulin, please consider ordering very low dose basal insulin. Recommend starting with Levemir 4 units Q24H (based on 41 kg x 0.1 units). Correction (SSI): At time of transition from IV to SQ insulin, please consider ordering Novolog 0-9 units Q4H.  Thanks, Barnie Alderman, RN, MSN, CDE Diabetes Coordinator Inpatient Diabetes Program [phone] (Team Pager from [place] to [place]) [phone] ([place] office) [phone] [facility] office) [phone] [facility] office)

## 2015-02-27 NOTE — Progress Notes (Signed)
Notified by [facility] of pt./family request for [facility] services at home after discharge. Chart and patient information are being reviewed for eligibility.  Writer spoke with pt. and her granddaughter at the bedside and spoke on the phone with her husband. Education was initiated related to hospice philosophy, services and team approach to care. Pt. and family voiced understanding of information provided.Per discussion plan is for discharge to home by personal vehicle when medically stable.  Please send signed and completed DNR form home with pt./family. Pt. will need presrcriptions for discharge comfort medications.  DME needs discussed and pt currently has a bsc, walker, cane and a shower chair at home. Family is requesting delivery of a bed and table tomorrow morning to the home. [facility] equipment manager Jewel Ysidro Evert notified and will contact [place] to arrange deliveyt to the home. The home address has been verified and pt's husband Jeneen Rinks is the family member to be called to arrange time of delivery.  [facility] aware of above. Please notify [facility] when pt. Is ready to leave the unit at discharge- call [phone] ( or [phone] after 5 pm). [facility] information and contact numbers have been given to [place], pt's granddaughter during the visit. Please call with any questions.  [facility] Liaison [phone]

## 2015-02-27 NOTE — Consult Note (Signed)
Consultation Note Date: 02/27/2015   Patient Name: Carmen [name]  DOB: 1934/01/16  MRN: [id number]  Age / Sex: 81 y.o., female  PCP: Carmen Chard, MD Referring Physician: Lavina Hamman, MD  Reason for Consultation: Establishing goals of care and Psychosocial/spiritual support    Clinical Assessment/Narrative:   Carmen [name] is a 81 y.o. female, with HTN, PVD with claudication, CAD s/p pacemaker placement, DM presents to the ER for anorexia and weight loss. The patient was hospitalized in November with acute kidney injury, acute enteritis, and constipation with stool impaction. Over the next several weeks she began to feel poorly again, eating less and getting out of bed less. She developed URI symptoms (cough and congestion). Over the last few days the patient stopped eating even though she was being fed by her family. In the ER today her creatinine has climbed from 1.5 on 01/16/15 to 1.98 today (02/26/15). U/A is positive for UTI. CXR is clear. Admited for obs for hydration, treatment of UTI, Palliative Medicine Consultation.    This NP Carmen [name] reviewed medical records, received report from team, assessed the patient and then meet at the patient's bedside along with her daughter Carmen [name] and patient's  husband Carmen [name] by telephone  to discuss diagnosis prognosis, [place], EOL wishes disposition and options.   A detailed discussion was had today regarding advanced directives.  Concepts specific to code status, artifical feeding and hydration, continued IV antibiotics and rehospitalization was had.  The difference between a aggressive medical intervention path  and a palliative comfort care path for this patient at this time was had.  Values and goals of care important to patient and family were attempted to be elicited.  Concept of Hospice and Palliative Care were discussed  Natural trajectory and expectations  at EOL were discussed.  Questions and concerns addressed.   Family encouraged to call with questions or concerns.  PMT will continue to support holistically.    Primary Decision Maker: self with support of her husband  HCPOA: no - none documented but tells me that her husband is the main decision maker  SUMMARY OF RECOMMENDATIONS  - treat the treatable, return home when stable and access hospice benefit if accepted  Code Status/Advance Care Planning: DNR    Code Status Orders        Start     Ordered   02/26/15 2101  Do not attempt resuscitation (DNR)   Continuous    Question Answer Comment  In the event of cardiac or respiratory ARREST Do not call a "code blue"   In the event of cardiac or respiratory ARREST Do not perform Intubation, CPR, defibrillation or ACLS   In the event of cardiac or respiratory ARREST Use medication by any route, position, wound care, and other measures to relive pain and suffering. May use oxygen, suction and manual treatment of airway obstruction as needed for comfort.      02/26/15 2102    Advance Directive Documentation        Most Recent Value   Type of Advance Directive  Living will   Pre-existing out of facility DNR order (yellow form or pink MOST form)     "MOST" Form in Place?        Other Directives:None  Palliative Prophylaxis:   Aspiration, Bowel Regimen, Frequent Pain Assessment, Oral Care and Turn Reposition  Additional Recommendations (Limitations, Scope, Preferences):  Avoid Hospitalization and No Artificial Feeding   Psycho-social/Spiritual:  Support System: [employer] Desire for further Chaplaincy support:no  Additional Recommendations: Education on Hospice  Prognosis:   Less than 6 months  Discharge Planning: Home with Hospice, will write for choice   Chief Complaint/ Primary Diagnoses: Present on Admission:  . Dehydration . Dehydration, moderate . Renal failure (ARF), acute on chronic (HCC) . Protein-calorie  malnutrition, severe ([place]) . Museum/gallery curator . HTN (hypertension) . CKD (chronic kidney disease), stage III . Cardiac pacemaker for history of intermittent heart block . Failure to thrive in adult . Acute on chronic renal failure ([place])  I have reviewed the medical record, interviewed the patient and family, and examined the patient. The following aspects are pertinent.  Past Medical History  Diagnosis Date  . Hypertension   . Coronary artery disease   . Renal disorder   . Herniated lumbar intervertebral disc   . Cataract     "both eyes; blood pressure's always too high to have them fixed" (09/11/2014)  . Renovascular hypertension      s/p left renal artery stent 12/2007.  S/P balloon angioplasty on 02/16/10 for ISR, BP was  controlled well since then.     . Claudication in peripheral vascular disease ([place])     02/16/10: Left CIA 9.0x28 Omnilink and REIA 8.0x40 seff expanding Zilver.   right subclavian artery stent 03/18/2008,  . Peripheral vascular disease ([place])   . Anxiety   . Pacemaker   . Pneumonia X 2  . IDDM (insulin dependent diabetes mellitus) ([place])   . Migraine     "used to have terrible migraines; stopped in the 1990's"  . Stroke [facility]) 1999    Stroke and TIA in 1999 with right sided weakness, now with residual right arm weakness (09/11/2014)  . Arthritis     "everywhere"  . Chronic lower back pain   . UTI (lower urinary tract infection) 02/2015   Social History   Social History  . Marital Status: Married    Spouse Name: N/A  . Number of Children: N/A  . Years of Education: N/A   Social History Main Topics  . Smoking status: Current Every Day Smoker -- 0.00 packs/day for 70 years    Types: Cigarettes  . Smokeless tobacco: Never Used     Comment: 09/11/2014 "I used to smoke very heavy; down to 1 cigarette/day"  . Alcohol Use: No     Comment: "stopped drinking back in the 1970's"  . Drug Use: No  . Sexual Activity: No   Other Topics Concern  . None    Social History Narrative   Family History  Problem Relation Age of Onset  . Cancer Father   . Heart attack Mother    Scheduled Meds: . aspirin EC  81 mg Oral Daily  . cefTRIAXone (ROCEPHIN)  IV  1 g Intravenous Q24H  . clopidogrel  75 mg Oral Daily  . feeding supplement (GLUCERNA SHAKE)  237 mL Oral BID BM  . heparin  5,000 Units Subcutaneous 3 times per day  . hydrALAZINE  100 mg Oral QID  . isosorbide mononitrate  120 mg Oral Daily  . lipase/protease/amylase  12,000 Units Oral TID AC  . omega-3 acid ethyl esters  1 g Oral Daily  . pantoprazole  40 mg Oral Daily   Continuous Infusions:  PRN Meds:.acetaminophen **OR** acetaminophen, alum & mag hydroxide-simeth, barrier cream, guaiFENesin-dextromethorphan, ondansetron **OR** ondansetron (ZOFRAN) IV, senna-docusate Medications Prior to Admission:  Prior to Admission medications   Medication Sig Start Date End Date Taking? Authorizing Provider  aspirin EC 81 MG tablet Take 81 mg  by mouth daily.   Yes Historical Provider, MD  benazepril (LOTENSIN) 40 MG tablet Take 40 mg by mouth daily. 01/28/15  Yes Historical Provider, MD  clopidogrel (PLAVIX) 75 MG tablet Take 75 mg by mouth daily.   Yes Historical Provider, MD  feeding supplement, GLUCERNA SHAKE, (GLUCERNA SHAKE) LIQD Take 237 mLs by mouth 2 (two) times daily between meals. 01/16/15  Yes Belkys A Regalado, MD  furosemide (LASIX) 20 MG tablet Take 20 mg by mouth 2 (two) times daily. 01/28/15  Yes Historical Provider, MD  guaiFENesin (MUCINEX) 600 MG 12 hr tablet Take 1 tablet (600 mg total) by mouth 2 (two) times daily as needed. 02/15/15  Yes Dorie Rank, MD  hydrALAZINE (APRESOLINE) 100 MG tablet Take 1 tablet (100 mg total) by mouth 4 (four) times daily. 01/16/15  Yes Belkys A Regalado, MD  linagliptin (TRADJENTA) 5 MG TABS tablet Take 1 tablet (5 mg total) by mouth daily. 12/01/14  Yes Lauree Chandler, NP  lisinopril (PRINIVIL,ZESTRIL) 10 MG tablet Take 10 mg by mouth daily.  02/04/15  Yes Historical Provider, MD  metoprolol tartrate (LOPRESSOR) 25 MG tablet Take 25 mg by mouth 2 (two) times daily.   Yes Historical Provider, MD  Omega-3 Fatty Acids (FISH OIL) 1000 MG CAPS Take 1,000 mg by mouth 2 (two) times daily.    Yes Historical Provider, MD  pantoprazole (PROTONIX) 40 MG tablet Take 1 tablet (40 mg total) by mouth daily. 01/16/15  Yes Belkys A Regalado, MD  potassium chloride (K-DUR) 10 MEQ tablet Take 20 mEq by mouth daily. Take with food 01/28/15  Yes Historical Provider, MD  isosorbide mononitrate (IMDUR) 120 MG 24 hr tablet Take 1 tablet (120 mg total) by mouth daily. 12/12/12   Annita Brod, MD   Allergies  Allergen Reactions  . Norvasc [Amlodipine Besylate] Swelling  . Peanut-Containing Drug Products Other (See Comments)    Stomach pain    Review of Systems  Constitutional: Positive for fatigue.  All other systems reviewed and are negative.   Physical Exam  Constitutional: Vital signs are normal. She is cooperative.  -frail  HENT:  Mouth/Throat: Mucous membranes are normal.  Cardiovascular: Normal rate, regular rhythm and normal heart sounds.   Respiratory: She has decreased breath sounds in the right lower field and the left lower field.  GI: Soft. Normal appearance. There is no tenderness.  Musculoskeletal:  -generalized weakness  Neurological: She is alert.  Skin: Skin is warm and dry.    Vital Signs: BP 133/52 mmHg  Pulse 59  Temp(Src) 97.9 F (36.6 C) (Oral)  Resp 17  Wt 41.051 kg (90 lb 8 oz)  SpO2 96%  SpO2: SpO2: 96 % O2 Device:SpO2: 96 % O2 Flow Rate: .   IO: Intake/output summary:  Intake/Output Summary (Last 24 hours) at 02/27/15 1125 Last data filed at 02/27/15 0630  Gross per 24 hour  Intake   1103 ml  Output   1100 ml  Net      3 ml    LBM: Last BM Date: 02/26/15 Baseline Weight: Weight: 41.051 kg (90 lb 8 oz) Most recent weight: Weight: 41.051 kg (90 lb 8 oz)      Palliative Assessment/Data:    Flowsheet Rows        Most Recent Value   Intake Tab    Referral Department  Hospitalist   Unit at Time of Referral  ER   Palliative Care Primary Diagnosis  Other (Comment) [FTT]   Date Notified  02/26/15  Palliative Care Type  New Palliative care   Reason for referral  Clarify Goals of Care   Date of Admission  02/26/15   # of days IP prior to Palliative referral  0   Clinical Assessment    Psychosocial & Spiritual Assessment    Palliative Care Outcomes       Additional Data Reviewed:  CBC:    Component Value Date/Time   WBC 4.2 02/26/2015 0858   WBC 4.6 11/18/2014   HGB 14.7 02/26/2015 0858   HCT 43.9 02/26/2015 0858   PLT 202 02/26/2015 0858   MCV 99.1 02/26/2015 0858   NEUTROABS 2.4 02/26/2015 0858   LYMPHSABS 1.3 02/26/2015 0858   MONOABS 0.3 02/26/2015 0858   EOSABS 0.1 02/26/2015 0858   BASOSABS 0.0 02/26/2015 0858   Comprehensive Metabolic Panel:    Component Value Date/Time   NA 135 02/27/2015 0748   NA 139 11/18/2014   K 5.1 02/27/2015 0748   CL 108 02/27/2015 0748   CO2 19* 02/27/2015 0748   BUN 85* 02/27/2015 0748   BUN 16 11/18/2014   CREATININE 2.16* 02/27/2015 0748   CREATININE 1.3* 11/18/2014   GLUCOSE 148* 02/27/2015 0748   CALCIUM 8.4* 02/27/2015 0748   AST 45* 02/26/2015 0858   ALT 38 02/26/2015 0858   ALKPHOS 93 02/26/2015 0858   BILITOT 0.5 02/26/2015 0858   PROT 6.8 02/26/2015 0858   ALBUMIN 3.5 02/26/2015 0858   Discussed with Dr Posey Pronto  Time In: 0845 Time Out: 1000 Time Total: 75 min Greater than 50%  of this time was spent counseling and coordinating care related to the above assessment and plan.  Signed by: Carmen Lessen, NP  Knox Royalty, NP  02/27/2015, 11:25 AM  Please contact Palliative Medicine Team phone at [phone] for questions and concerns.

## 2015-02-27 NOTE — Progress Notes (Signed)
   02/27/15 1100  SLP G-Codes **NOT FOR INPATIENT CLASS**  Functional Assessment Tool Used (clinical judgement)  Functional Limitations Swallowing  Swallow Current Status [id number]) CI  Swallow Goal Status [id number]) CI  Swallow Discharge Status [id number]) CI  SLP Evaluations  $ SLP Speech Visit 1 Procedure  SLP Evaluations  $BSS Swallow 1 Procedure

## 2015-02-27 NOTE — Evaluation (Signed)
Physical Therapy Evaluation Patient Details Name: Carmen [name] MRN: [id number] DOB: 1933/05/13 Today's Date: 02/27/2015   History of Present Illness  81 y.o. female admitted to [facility] on 02/26/15 for UTI, FTT/weakness, and possible chronic pancreatitis.  Pt with significant PMhx of HTN, CAD, renal disorder, herniated lumbar disc, back surgery, claudication/PVD, pacemaker, DM, and stroke with R sided residual deficits.  Clinical Impression  Pt was able to get up and ambulate the halls, but she is quite unsteady on her feet.  She reports being active with home therapy (PT, aide) and from what I can get from her and RN reports, she has a very supportive family.  If family can provide 24/7 support for a week after discharge I would recommend HHPT resume and pt go home.  She seems to have some mild cognitive deficits that may be left over from the stroke as well as some right sided inattention.   PT to follow acutely for deficits listed below.       Follow Up Recommendations Home health PT;Supervision/Assistance - 24 hour    Equipment Recommendations  None recommended by PT    Recommendations for Other Services   NA    Precautions / Restrictions Precautions Precautions: Fall Precaution Comments: pt reports no h/o falls, but unsteady on her feet.       Mobility  Bed Mobility Overal bed mobility: Needs Assistance Bed Mobility: Supine to Sit     Supine to sit: Supervision;HOB elevated     General bed mobility comments: HOb elevated and pt relying heavily on railing for leverage at trunk.   Transfers Overall transfer level: Needs assistance Equipment used: 1 person hand held assist Transfers: Sit to/from Stand Sit to Stand: Min assist         General transfer comment: Min assist to support trunk for balance during transitions. Verbal cues for safety and safe hand placement.   Ambulation/Gait Ambulation/Gait assistance: Min assist Ambulation Distance (Feet): 120 Feet Assistive  device: 1 person hand held assist Gait Pattern/deviations: Step-through pattern;Staggering left;Staggering right Gait velocity: decreased Gait velocity interpretation: <1.8 ft/sec, indicative of risk for recurrent falls General Gait Details: Pt needed min assist for balance during gait. She is frequently reaching for objects to stabilize herself/balance both in room and in the hallway.  She tends to not attend to obstacles on her right side unless cueed to do so and her right leg showed signs of fatigue on the way back to her room (increased muscle fatigue and give at the right knee).           Balance Overall balance assessment: Needs assistance Sitting-balance support: Feet supported;No upper extremity supported Sitting balance-Leahy Scale: Good     Standing balance support: Single extremity supported;Bilateral upper extremity supported;No upper extremity supported Standing balance-Leahy Scale: Fair                               Pertinent Vitals/Pain Pain Assessment: No/denies pain    Home Living Family/patient expects to be discharged to:: Private residence Living Arrangements: Children;Spouse/significant other (son, grandchild- 26 something) Available Help at Discharge: Family;Available 24 hours/day Type of Home: House Home Access: Stairs to enter Entrance Stairs-Rails: Left (wall on the right side) Entrance Stairs-Number of Steps: 2 Home Layout: One level Home Equipment: Walker - 2 wheels;Cane - single point;Bedside commode;Hand held shower head;Tub bench Additional Comments: not sure of the accuracy of her answers, they are different than what was already  listed in history and some of them did not make sense or she delayed in her response.      Prior Function Level of Independence: Independent         Comments: per pt report she needed closer supervision for her activities, but no physical assist     Hand Dominance   Dominant Hand: Right ("but I use my  left hand all the time" pt reports stroke R)    Extremity/Trunk Assessment   Upper Extremity Assessment: Defer to OT evaluation           Lower Extremity Assessment: Generalized weakness (right with fatigue functionally more unstable than left)      Cervical / Trunk Assessment: Other exceptions  Communication   Communication: No difficulties  Cognition Arousal/Alertness: Awake/alert Behavior During Therapy: WFL for tasks assessed/performed Overall Cognitive Status: No family/caregiver present to determine baseline cognitive functioning ("I think December")       Memory: Decreased short-term memory                       Assessment/Plan    PT Assessment Patient needs continued PT services  PT Diagnosis Difficulty walking;Abnormality of gait;Generalized weakness   PT Problem List Decreased strength;Decreased activity tolerance;Decreased balance;Decreased mobility;Decreased cognition;Decreased knowledge of use of DME  PT Treatment Interventions DME instruction;Gait training;Stair training;Functional mobility training;Therapeutic activities;Therapeutic exercise;Balance training;Neuromuscular re-education;Patient/family education   PT Goals (Current goals can be found in the Care Plan section) Acute Rehab PT Goals Patient Stated Goal: to go home with family PT Goal Formulation: With patient Time For Goal Achievement: 03/13/15 Potential to Achieve Goals: Good    Frequency Min 3X/week           End of Session Equipment Utilized During Treatment: Gait belt Activity Tolerance: Patient limited by fatigue Patient left: in chair;with call bell/phone within reach Nurse Communication: Mobility status    Functional Assessment Tool Used: assist level Functional Limitation: Mobility: Walking and moving around Mobility: Walking and Moving Around Current Status [phone]): At least 20 percent but less than 40 percent impaired, limited or restricted Mobility: Walking and  Moving Around Goal Status [phone]): At least 1 percent but less than 20 percent impaired, limited or restricted    Time: 1319-1350 PT Time Calculation (min) (ACUTE ONLY): 31 min   Charges:   PT Evaluation $Initial PT Evaluation Tier I: 1 Procedure PT Treatments $Gait Training: 8-22 mins   PT G Codes:   PT G-Codes **NOT FOR INPATIENT CLASS** Functional Assessment Tool Used: assist level Functional Limitation: Mobility: Walking and moving around Mobility: Walking and Moving Around Current Status [id number]): At least 20 percent but less than 40 percent impaired, limited or restricted Mobility: Walking and Moving Around Goal Status [phone]): At least 1 percent but less than 20 percent impaired, limited or restricted    Calista Crain B. Kyonna Frier, PT, DPT [phone]   02/27/2015, 2:00 PM

## 2015-02-27 NOTE — Progress Notes (Signed)
[facility] Progress Note    Patient: Carmen [name]    [id number]  DOB: [birth year]     DOA: 02/26/2015 Date of Service: the patient was seen and examined on 02/27/2015  Subjective: Patient presents with poor appetite and weight loss. At present appetite is improved no nausea no vomiting and abdominal pain no chest pain. Nutrition: able to tolerate oral diet Activity: Ambulating in the room Last BM: Prior to arrival  Assessment and Plan: 1. Acute on chronic renal failure [facility]) Patient presents with poor oral intake. Renal function has worsened mildly. Patient was felt clinically dehydrated in the ER. Continuing with hydration at present.  2. Diabetes mellitus type 2 with peripheral vascular disease Uncontrolled. Patient was hyperglycemic overnight. Patient was started on insulin drip. We will transition her to Levemir as well as sensitive sliding scale as per recommendation from diabetic educator. Continue CBG monitoring.  3. Failure to thrive.  Palliative care was consulted. Appreciate input. Patient will be going home on home with hospice.  4. Protein calorie malnutrition. Continue nutritional supplements.   5. Prior CVA. Continuing aspirin and Plavix.  6. UTI. Continuing ceftriaxone.  DVT Prophylaxis: subcutaneous Heparin Nutrition: Diabetic diet Advance goals of care discussion: DNR/DNI  Brief Summary of Hospitalization:  HPI: As per the H and P dictated on admission, "Carmen [name] is a 81 y.o. female, with HTN, PVD with claudication, CAD s/p pacemaker placement, DM presents to the ER for anorexia and weight loss. The patient was hospitalized in November with acute kidney injury, acute enteritis, and constipation with stool impaction. Over the next several weeks she began to feel poorly again, eating less and getting out of bed less. She developed URI symptoms (cough and congestion). Over the last few days the patient stopped eating even though she was  being fed by her family. In the ER today her creatinine has climbed from 1.5 on 01/16/15 to 1.98 today (02/26/15). U/A is positive for UTI. CXR is clear. We will admit her to obs for hydration, treatment of UTI, Palliative Medicine Consultation. The patient appears to be improving with IV fluids in the ER. She has perked up and is now eating on her own. She denies any pain." Daily update: 02/27/2015 transition to oral diet and stop IV insulin Consultants: Palliative care Procedures: None Antibiotics: Anti-infectives    Start     Dose/Rate Route Frequency Ordered Stop   02/26/15 1630  cefTRIAXone (ROCEPHIN) 1 g in dextrose 5 % 50 mL IVPB     1 g 100 mL/hr over 30 Minutes Intravenous Every 24 hours 02/26/15 1528         Family Communication: family was present at bedside, at the time of interview.  Opportunity was given to ask question and all questions were answered satisfactorily.   Disposition:  Expected discharge date: 02/28/2015 Barriers to safe discharge: Improvement in renal function and oral intake   Intake/Output Summary (Last 24 hours) at 02/27/15 1851 Last data filed at 02/27/15 1420  Gross per 24 hour  Intake   1173 ml  Output    400 ml  Net    773 ml   Filed Weights   02/27/15 0559  Weight: 41.051 kg (90 lb 8 oz)    Objective: Physical Exam: Filed Vitals:   02/27/15 0559 02/27/15 1221 02/27/15 1325 02/27/15 1555  BP: 133/52 156/80 131/55 135/49  Pulse: 59 61 61 69  Temp: 97.9 F (36.6 C)  97.8 F (36.6 C)   TempSrc: Oral  Oral  Resp: 17  18   Weight: 41.051 kg (90 lb 8 oz)     SpO2: 96% 100% 100% 99%     General: Appear in mild distress, no Rash; Oral Mucosa moist. Cardiovascular: S1 and S2 Present, no Murmur, no JVD Respiratory: Bilateral Air entry present and Clear to Auscultation, no Crackles, no wheezes Abdomen: Bowel Sound present, Soft and no tenderness Extremities: no Pedal edema, no calf tenderness Neurology: Grossly no focal neuro  deficit.  Data Reviewed: CBC:  Recent Labs Lab 02/22/15 1837 02/26/15 0858  WBC 3.7* 4.2  NEUTROABS 2.0 2.4  HGB 14.3 14.7  HCT 42.0 43.9  MCV 95.9 99.1  PLT 243 [id number]   Basic Metabolic Panel:  Recent Labs Lab 02/26/15 0858 02/26/15 1959 02/27/15 02/27/15 0525 02/27/15 0748  NA 140 132* 130* 136 135  K 5.1 6.3* 5.6* 4.5 5.1  CL 106 103 103 109 108  CO2 24 19* 16* 18* 19*  GLUCOSE 204* 651* 563* 177* 148*  BUN 102* 92* 88* 86* 85*  CREATININE 1.98* 2.21* 2.28* 2.17* 2.16*  CALCIUM 9.4 8.5* 8.2* 8.4* 8.4*  MG 2.5*  --   --   --   --   PHOS 5.3*  --   --   --   --    Liver Function Tests:  Recent Labs Lab 02/22/15 2020 02/26/15 0858  AST 27 45*  ALT 22 38  ALKPHOS 73 93  BILITOT 0.7 0.5  PROT 6.9 6.8  ALBUMIN 3.5 3.5    Recent Labs Lab 02/26/15 0858  LIPASE 14   No results for input(s): AMMONIA in the last 168 hours.  Cardiac Enzymes: No results for input(s): CKTOTAL, CKMB, CKMBINDEX, TROPONINI in the last 168 hours. BNP (last 3 results)  Recent Labs  09/11/14 1522  BNP 3381.1*    ProBNP (last 3 results) No results for input(s): PROBNP in the last 8760 hours.   CBG:  Recent Labs Lab 02/27/15 1047 02/27/15 1234 02/27/15 1320 02/27/15 1413 02/27/15 1708  GLUCAP 156* 217* 187* 111* 215*    Recent Results (from the past 240 hour(s))  Urine culture     Status: None (Preliminary result)   Collection Time: 02/26/15 10:49 AM  Result Value Ref Range Status   Specimen Description URINE, CLEAN CATCH  Final   Special Requests NONE  Final   Culture CULTURE REINCUBATED FOR BETTER GROWTH  Final   Report Status PENDING  Incomplete     Studies: No results found.   Scheduled Meds: . aspirin EC  81 mg Oral Daily  . cefTRIAXone (ROCEPHIN)  IV  1 g Intravenous Q24H  . clopidogrel  75 mg Oral Daily  . feeding supplement (GLUCERNA SHAKE)  237 mL Oral TID BM  . heparin  5,000 Units Subcutaneous 3 times per day  . hydrALAZINE  100 mg Oral QID  .  insulin aspart  0-5 Units Subcutaneous QHS  . insulin aspart  0-9 Units Subcutaneous TID WC  . insulin detemir  4 Units Subcutaneous QHS  . isosorbide mononitrate  120 mg Oral Daily  . lipase/protease/amylase  12,000 Units Oral TID AC  . omega-3 acid ethyl esters  1 g Oral Daily  . pantoprazole  40 mg Oral Daily  . polyethylene glycol  17 g Oral BID   Continuous Infusions: . sodium chloride 75 mL/hr at 02/27/15 1220   PRN Meds: acetaminophen **OR** acetaminophen, alum & mag hydroxide-simeth, barrier cream, bisacodyl, guaiFENesin-dextromethorphan, ondansetron **OR** ondansetron (ZOFRAN) IV, senna-docusate  Time spent: 30 minutes  Author: Berle Mull, MD [facility] Pager: [phone] 02/27/2015 6:51 PM  If 7PM-7AM, please contact night-coverage at [email or web address], password [facility]

## 2015-02-28 DIAGNOSIS — N3000 Acute cystitis without hematuria: Secondary | ICD-10-CM | POA: Diagnosis not present

## 2015-02-28 DIAGNOSIS — Z79899 Other long term (current) drug therapy: Secondary | ICD-10-CM | POA: Diagnosis not present

## 2015-02-28 DIAGNOSIS — K5909 Other constipation: Secondary | ICD-10-CM

## 2015-02-28 DIAGNOSIS — R64 Cachexia: Secondary | ICD-10-CM | POA: Diagnosis present

## 2015-02-28 DIAGNOSIS — N184 Chronic kidney disease, stage 4 (severe): Secondary | ICD-10-CM | POA: Diagnosis not present

## 2015-02-28 DIAGNOSIS — H269 Unspecified cataract: Secondary | ICD-10-CM | POA: Diagnosis present

## 2015-02-28 DIAGNOSIS — Z66 Do not resuscitate: Secondary | ICD-10-CM | POA: Diagnosis present

## 2015-02-28 DIAGNOSIS — Z7902 Long term (current) use of antithrombotics/antiplatelets: Secondary | ICD-10-CM | POA: Diagnosis not present

## 2015-02-28 DIAGNOSIS — I2510 Atherosclerotic heart disease of native coronary artery without angina pectoris: Secondary | ICD-10-CM | POA: Diagnosis present

## 2015-02-28 DIAGNOSIS — I679 Cerebrovascular disease, unspecified: Secondary | ICD-10-CM | POA: Diagnosis not present

## 2015-02-28 DIAGNOSIS — Z7982 Long term (current) use of aspirin: Secondary | ICD-10-CM | POA: Diagnosis not present

## 2015-02-28 DIAGNOSIS — M1990 Unspecified osteoarthritis, unspecified site: Secondary | ICD-10-CM | POA: Diagnosis present

## 2015-02-28 DIAGNOSIS — R627 Adult failure to thrive: Secondary | ICD-10-CM | POA: Diagnosis not present

## 2015-02-28 DIAGNOSIS — E1159 Type 2 diabetes mellitus with other circulatory complications: Secondary | ICD-10-CM | POA: Diagnosis not present

## 2015-02-28 DIAGNOSIS — F17210 Nicotine dependence, cigarettes, uncomplicated: Secondary | ICD-10-CM | POA: Diagnosis present

## 2015-02-28 DIAGNOSIS — N179 Acute kidney failure, unspecified: Secondary | ICD-10-CM | POA: Diagnosis not present

## 2015-02-28 DIAGNOSIS — I70213 Atherosclerosis of native arteries of extremities with intermittent claudication, bilateral legs: Secondary | ICD-10-CM | POA: Diagnosis not present

## 2015-02-28 DIAGNOSIS — I25119 Atherosclerotic heart disease of native coronary artery with unspecified angina pectoris: Secondary | ICD-10-CM | POA: Diagnosis not present

## 2015-02-28 DIAGNOSIS — N183 Chronic kidney disease, stage 3 (moderate): Secondary | ICD-10-CM | POA: Diagnosis not present

## 2015-02-28 DIAGNOSIS — E860 Dehydration: Secondary | ICD-10-CM | POA: Diagnosis present

## 2015-02-28 DIAGNOSIS — E46 Unspecified protein-calorie malnutrition: Secondary | ICD-10-CM | POA: Diagnosis not present

## 2015-02-28 DIAGNOSIS — E1165 Type 2 diabetes mellitus with hyperglycemia: Secondary | ICD-10-CM | POA: Diagnosis present

## 2015-02-28 DIAGNOSIS — E1151 Type 2 diabetes mellitus with diabetic peripheral angiopathy without gangrene: Secondary | ICD-10-CM | POA: Diagnosis present

## 2015-02-28 DIAGNOSIS — Z950 Presence of cardiac pacemaker: Secondary | ICD-10-CM | POA: Diagnosis not present

## 2015-02-28 DIAGNOSIS — K5900 Constipation, unspecified: Secondary | ICD-10-CM | POA: Diagnosis present

## 2015-02-28 DIAGNOSIS — E875 Hyperkalemia: Secondary | ICD-10-CM | POA: Diagnosis present

## 2015-02-28 DIAGNOSIS — E43 Unspecified severe protein-calorie malnutrition: Secondary | ICD-10-CM | POA: Diagnosis present

## 2015-02-28 DIAGNOSIS — F419 Anxiety disorder, unspecified: Secondary | ICD-10-CM | POA: Diagnosis present

## 2015-02-28 DIAGNOSIS — E1122 Type 2 diabetes mellitus with diabetic chronic kidney disease: Secondary | ICD-10-CM | POA: Diagnosis present

## 2015-02-28 DIAGNOSIS — Z8673 Personal history of transient ischemic attack (TIA), and cerebral infarction without residual deficits: Secondary | ICD-10-CM | POA: Diagnosis not present

## 2015-02-28 DIAGNOSIS — E11649 Type 2 diabetes mellitus with hypoglycemia without coma: Secondary | ICD-10-CM | POA: Diagnosis not present

## 2015-02-28 DIAGNOSIS — Z681 Body mass index (BMI) 19 or less, adult: Secondary | ICD-10-CM | POA: Diagnosis not present

## 2015-02-28 DIAGNOSIS — Z515 Encounter for palliative care: Secondary | ICD-10-CM | POA: Diagnosis present

## 2015-02-28 DIAGNOSIS — I129 Hypertensive chronic kidney disease with stage 1 through stage 4 chronic kidney disease, or unspecified chronic kidney disease: Secondary | ICD-10-CM | POA: Diagnosis present

## 2015-02-28 LAB — BASIC METABOLIC PANEL
Anion gap: 5 (ref 5–15)
Anion gap: 7 (ref 5–15)
BUN: 70 mg/dL — AB (ref 6–20)
BUN: 58 mg/dL — AB (ref 6–20)
CHLORIDE: 114 mmol/L — AB (ref 101–111)
CO2: 20 mmol/L — AB (ref 22–32)
CO2: 18 mmol/L — AB (ref 22–32)
CREATININE: 1.8 mg/dL — AB (ref 0.44–1.00)
Calcium: 8.5 mg/dL — ABNORMAL LOW (ref 8.9–10.3)
Calcium: 8.3 mg/dL — ABNORMAL LOW (ref 8.9–10.3)
Chloride: 114 mmol/L — ABNORMAL HIGH (ref 101–111)
Creatinine, Ser: 2.22 mg/dL — ABNORMAL HIGH (ref 0.44–1.00)
GFR calc Af Amer: 23 mL/min — ABNORMAL LOW (ref >60)
GFR calc Af Amer: 30 mL/min — ABNORMAL LOW (ref >60)
GFR calc non Af Amer: 26 mL/min — ABNORMAL LOW (ref >60)
GFR, EST NON AFRICAN AMERICAN: 20 mL/min — AB (ref >60)
GLUCOSE: 157 mg/dL — AB (ref 65–99)
Glucose, Bld: 287 mg/dL — ABNORMAL HIGH (ref 65–99)
POTASSIUM: 5.3 mmol/L — AB (ref 3.5–5.1)
Potassium: 4.7 mmol/L (ref 3.5–5.1)
Sodium: 139 mmol/L (ref 135–145)
Sodium: 139 mmol/L (ref 135–145)

## 2015-02-28 LAB — CBC
HEMATOCRIT: 33.8 % — AB (ref 36.0–46.0)
Hemoglobin: 11.3 g/dL — ABNORMAL LOW (ref 12.0–15.0)
MCH: 33.3 pg (ref 26.0–34.0)
MCHC: 33.4 g/dL (ref 30.0–36.0)
MCV: 99.7 fL (ref 78.0–100.0)
Platelets: 156 10*3/uL (ref 150–400)
RBC: 3.39 MIL/uL — ABNORMAL LOW (ref 3.87–5.11)
RDW: 13.3 % (ref 11.5–15.5)
WBC: 3.9 10*3/uL — ABNORMAL LOW (ref 4.0–10.5)

## 2015-02-28 LAB — GLUCOSE, CAPILLARY
GLUCOSE-CAPILLARY: 185 mg/dL — AB (ref 65–99)
Glucose-Capillary: 149 mg/dL — ABNORMAL HIGH (ref 65–99)
Glucose-Capillary: 366 mg/dL — ABNORMAL HIGH (ref 65–99)
Glucose-Capillary: 407 mg/dL — ABNORMAL HIGH (ref 65–99)
Glucose-Capillary: 200 mg/dL — ABNORMAL HIGH (ref 65–99)

## 2015-02-28 LAB — URINE CULTURE

## 2015-02-28 LAB — POTASSIUM: POTASSIUM: 5.3 mmol/L — AB (ref 3.5–5.1)

## 2015-02-28 MED ORDER — SODIUM POLYSTYRENE SULFONATE 15 GM/60ML PO SUSP
30.0000 g | Freq: Once | ORAL | Status: AC
  Administered 2015-02-28: 30 g via ORAL
  Filled 2015-02-28 (×2): qty 120

## 2015-02-28 NOTE — Care Management Note (Signed)
Case Management Note  Patient Details  Name: Carmen [name] MRN: [id number] Date of Birth: 04/02/1933  Subjective/Objective:                    Action/Plan:  Spoke with patient and husband via phone .  Hospital bed being delivered this am . Referral to Hospice and [place] was made yesterday , same following  Expected Discharge Date:                  Expected Discharge Plan:  [place]  In-House Referral:     Discharge planning Services  CM Consult  Post Acute Care Choice:  Hospice Choice offered to:     DME Arranged:  Hospital bed DME Agency:  [place]:    [place]:  [facility]  Status of Service:  Completed, signed off  Medicare Important Message Given:    Date Medicare IM Given:    Medicare IM give by:    Date Additional Medicare IM Given:    Additional Medicare Important Message give by:     If discussed at [place] of Stay Meetings, dates discussed:    Additional Comments:  [name], RN 02/28/2015, 9:43 AM

## 2015-02-28 NOTE — Progress Notes (Signed)
Physical Therapy Treatment Patient Details Name: Carmen [name] MRN: [id number] DOB: 1933/05/11 Today's Date: 02/28/2015    History of Present Illness 81 y.o. female admitted to [facility] on 02/26/15 for UTI, FTT/weakness, and possible chronic pancreatitis.  Pt with significant PMhx of HTN, CAD, renal disorder, herniated lumbar disc, back surgery, claudication/PVD, pacemaker, DM, and stroke with R sided residual deficits.    PT Comments    Patient with ability to ambulate 130 ft with HHA before fatigued. Supervision/min guard for transfers and min guard/min A for ambulation. Pt with tendency to neglect R UE. Pt led through bilateral LE exercises and tolerated them well. Current plan remains appropriate.  Follow Up Recommendations  Home health PT;Supervision/Assistance - 24 hour     Equipment Recommendations  None recommended by PT    Recommendations for Other Services       Precautions / Restrictions Precautions Precautions: Fall Precaution Comments: pt reports no h/o falls, but unsteady on her feet.     Mobility  Bed Mobility Overal bed mobility: Needs Assistance Bed Mobility: Supine to Sit     Supine to sit: Supervision;HOB elevated     General bed mobility comments: HOB elevated and use of bedrail; no cues or physical assist needed  Transfers Overall transfer level: Needs assistance Equipment used: 1 person hand held assist Transfers: Sit to/from Stand Sit to Stand: Min guard;Supervision         General transfer comment: vc for hand placement and min guard for safety from EOB; supervision from recliner with no need for cues  Ambulation/Gait Ambulation/Gait assistance: Min assist;Min guard Ambulation Distance (Feet): 130 Feet Assistive device: 1 person hand held assist Gait Pattern/deviations: Step-through pattern;Decreased stride length;Shuffle Gait velocity: decreased   General Gait Details: Min A for balance at times especially when turning but no LOB; pt  required cues to move R UE when getting close to objects while ambulating otherwise pt tends to neglect R UE    Stairs            Wheelchair Mobility    Modified Rankin (Stroke Patients Only)       Balance Overall balance assessment: Needs assistance Sitting-balance support: Feet supported Sitting balance-Leahy Scale: Good     Standing balance support: Single extremity supported Standing balance-Leahy Scale: Fair                      Cognition Arousal/Alertness: Awake/alert Behavior During Therapy: WFL for tasks assessed/performed Overall Cognitive Status: No family/caregiver present to determine baseline cognitive functioning ("I think December")       Memory: Decreased short-term memory              Exercises General Exercises - Lower Extremity Long Arc Quad: AROM;Both;15 reps;Seated Hip Flexion/Marching: AROM;Both;15 reps;Seated Heel Raises: AROM;Both;15 reps;Seated    General Comments        Pertinent Vitals/Pain      Home Living                      Prior Function            PT Goals (current goals can now be found in the care plan section) Acute Rehab PT Goals Patient Stated Goal: to go home with family PT Goal Formulation: With patient Time For Goal Achievement: 03/13/15 Potential to Achieve Goals: Good Progress towards PT goals: Progressing toward goals    Frequency  Min 3X/week    PT Plan Current plan remains appropriate  Co-evaluation             End of Session Equipment Utilized During Treatment: Gait belt Activity Tolerance: Patient limited by fatigue Patient left: in chair;with call bell/phone within reach     Time: 1323-1345 PT Time Calculation (min) (ACUTE ONLY): 22 min  Charges:  $Gait Training: 8-22 mins                    G Codes:  Functional Assessment Tool Used: assist level Functional Limitation: Mobility: Walking and moving around Mobility: Walking and Moving Around Current Status  [phone]): At least 20 percent but less than 40 percent impaired, limited or restricted Mobility: Walking and Moving Around Goal Status [phone]): At least 1 percent but less than 20 percent impaired, limited or restricted   Salina April, PTA Pager: [phone]   02/28/2015, 2:02 PM

## 2015-02-28 NOTE — Progress Notes (Signed)
[facility] Progress Note    Patient: Carmen [name]    [id number]  DOB: 12-Jul-1933     DOA: 02/26/2015 Date of Service: the patient was seen and examined on 02/28/2015  Subjective: Patient is eating appropriate denies having any nausea or vomiting or abdominal pain. Nutrition: able to tolerate oral diet Activity: Ambulating in the hallway Last BM: Prior to arrival  Assessment and Plan: 1. Acute on chronic renal failure (HCC)  Hyperkalemia  Patient presents with poor oral intake. Renal function has worsened mildly. Patient was felt clinically dehydrated in the ER. Continuing with hydration at present.  Potassium was elevated earlier in the morning. Received Kayexalate. Repeat potassium is significantly improved.  2. Diabetes mellitus type 2 with peripheral vascular disease Uncontrolled. Patient was hyperglycemic during the day due to patient receiving non-carb modified diet. On admission the patient was started on insulin drip for uncontrolled hyperglycemia. Continue with 4 units of Levemir and before meals at bedtime sliding scale  3. Failure to thrive.  Palliative care was consulted. Appreciate input. Patient will be going home on home with hospice.  4. Protein calorie malnutrition. Continue nutritional supplements. Appreciate input from dietitian   5. Prior CVA. Continuing aspirin and Plavix.  6. UTI. Continuing ceftriaxone. Last dose 02/28/2015  DVT Prophylaxis: subcutaneous Heparin Nutrition: Diabetic diet Advance goals of care discussion: DNR/DNI  Brief Summary of Hospitalization:  HPI: As per the H and P dictated on admission, "Carmen [name] is a 81 y.o. female, with HTN, PVD with claudication, CAD s/p pacemaker placement, DM presents to the ER for anorexia and weight loss. The patient was hospitalized in November with acute kidney injury, acute enteritis, and constipation with stool impaction. Over the next several weeks she began to feel poorly  again, eating less and getting out of bed less. She developed URI symptoms (cough and congestion). Over the last few days the patient stopped eating even though she was being fed by her family. In the ER today her creatinine has climbed from 1.5 on 01/16/15 to 1.98 today (02/26/15). U/A is positive for UTI. CXR is clear. We will admit her to obs for hydration, treatment of UTI, Palliative Medicine Consultation. The patient appears to be improving with IV fluids in the ER. She has perked up and is now eating on her own. She denies any pain." Daily update: 02/27/2015 transition to oral diet and stop IV insulin.  Hyperkalemia 02/25/2015, hyperglycemia as well Consultants: Palliative care Procedures: None Antibiotics: Anti-infectives    Start     Dose/Rate Route Frequency Ordered Stop   02/26/15 1630  cefTRIAXone (ROCEPHIN) 1 g in dextrose 5 % 50 mL IVPB     1 g 100 mL/hr over 30 Minutes Intravenous Every 24 hours 02/26/15 1528        Family Communication: family was present at bedside, at the time of interview.  Opportunity was given to ask question and all questions were answered satisfactorily.   Disposition:  Expected discharge date: 03/01/2015 Barriers to safe discharge: Hyperglycemia and insulin adjustment   Intake/Output Summary (Last 24 hours) at 02/28/15 1636 Last data filed at 02/28/15 1122  Gross per 24 hour  Intake 1742.5 ml  Output    600 ml  Net 1142.5 ml   Filed Weights   02/27/15 0559 02/27/15 2025  Weight: 41.051 kg (90 lb 8 oz) 41 kg (90 lb 6.2 oz)    Objective: Physical Exam: Filed Vitals:   02/27/15 2017 02/27/15 2025 02/28/15 0523 02/28/15 0925  BP: 147/75  155/51 186/47  Pulse: 70  69   Temp: 98.1 F (36.7 C)  98.4 F (36.9 C)   TempSrc: Oral  Oral   Resp: 19  19   Height:  5\' 4"  (1.626 m)    Weight:  41 kg (90 lb 6.2 oz)    SpO2: 98%  98%     General: Appear in mild distress, no Rash; Oral Mucosa moist. Cardiovascular: S1 and S2 Present, no  Murmur, no JVD Respiratory: Bilateral Air entry present and Clear to Auscultation, no Crackles, no wheezes Abdomen: Bowel Sound present, Soft and no tenderness Extremities: no Pedal edema, no calf tenderness  Data Reviewed: CBC:  Recent Labs Lab 02/22/15 1837 02/26/15 0858 02/28/15 0536  WBC 3.7* 4.2 3.9*  NEUTROABS 2.0 2.4  --   HGB 14.3 14.7 11.3*  HCT 42.0 43.9 33.8*  MCV 95.9 99.1 99.7  PLT 243 202 [id number]   Basic Metabolic Panel:  Recent Labs Lab 02/26/15 0858  02/27/15 02/27/15 0525 02/27/15 0748 02/28/15 0536 02/28/15 1335 02/28/15 1518  NA 140  < > 130* 136 135 139  --  139  K 5.1  < > 5.6* 4.5 5.1 5.3* 5.3* 4.7  CL 106  < > 103 109 108 114*  --  114*  CO2 24  < > 16* 18* 19* 20*  --  18*  GLUCOSE 204*  < > 563* 177* 148* 157*  --  287*  BUN 102*  < > 88* 86* 85* 70*  --  58*  CREATININE 1.98*  < > 2.28* 2.17* 2.16* 2.22*  --  1.80*  CALCIUM 9.4  < > 8.2* 8.4* 8.4* 8.5*  --  8.3*  MG 2.5*  --   --   --   --   --   --   --   PHOS 5.3*  --   --   --   --   --   --   --   < > = values in this interval not displayed. Liver Function Tests:  Recent Labs Lab 02/22/15 2020 02/26/15 0858  AST 27 45*  ALT 22 38  ALKPHOS 73 93  BILITOT 0.7 0.5  PROT 6.9 6.8  ALBUMIN 3.5 3.5    Recent Labs Lab 02/26/15 0858  LIPASE 14   No results for input(s): AMMONIA in the last 168 hours.  Cardiac Enzymes: No results for input(s): CKTOTAL, CKMB, CKMBINDEX, TROPONINI in the last 168 hours. BNP (last 3 results)  Recent Labs  09/11/14 1522  BNP 3381.1*    ProBNP (last 3 results) No results for input(s): PROBNP in the last 8760 hours.   CBG:  Recent Labs Lab 02/27/15 1708 02/27/15 2119 02/28/15 0726 02/28/15 1231 02/28/15 1232  GLUCAP 215* 211* 149* 407* 366*    Recent Results (from the past 240 hour(s))  Urine culture     Status: None   Collection Time: 02/26/15 10:49 AM  Result Value Ref Range Status   Specimen Description URINE, CLEAN CATCH  Final     Special Requests NONE  Final   Culture MULTIPLE SPECIES PRESENT, SUGGEST RECOLLECTION  Final   Report Status 02/28/2015 FINAL  Final     Studies: No results found.   Scheduled Meds: . aspirin EC  81 mg Oral Daily  . cefTRIAXone (ROCEPHIN)  IV  1 g Intravenous Q24H  . clopidogrel  75 mg Oral Daily  . feeding supplement (GLUCERNA SHAKE)  237 mL Oral TID BM  . heparin  5,000 Units Subcutaneous  3 times per day  . hydrALAZINE  100 mg Oral QID  . insulin aspart  0-5 Units Subcutaneous QHS  . insulin aspart  0-9 Units Subcutaneous TID WC  . insulin detemir  4 Units Subcutaneous QHS  . isosorbide mononitrate  120 mg Oral Daily  . lipase/protease/amylase  12,000 Units Oral TID AC  . omega-3 acid ethyl esters  1 g Oral Daily  . pantoprazole  40 mg Oral Daily  . polyethylene glycol  17 g Oral BID   Continuous Infusions: . sodium chloride 75 mL/hr at 02/27/15 2150   PRN Meds: acetaminophen **OR** acetaminophen, alum & mag hydroxide-simeth, barrier cream, bisacodyl, guaiFENesin-dextromethorphan, ondansetron **OR** ondansetron (ZOFRAN) IV, senna-docusate  Time spent: 30 minutes  Author: Berle Mull, MD [facility] Pager: [phone] 02/28/2015 4:36 PM  If 7PM-7AM, please contact night-coverage at [email or web address], password [facility]

## 2015-03-01 DIAGNOSIS — I70213 Atherosclerosis of native arteries of extremities with intermittent claudication, bilateral legs: Secondary | ICD-10-CM | POA: Diagnosis not present

## 2015-03-01 DIAGNOSIS — E46 Unspecified protein-calorie malnutrition: Secondary | ICD-10-CM | POA: Diagnosis not present

## 2015-03-01 DIAGNOSIS — I679 Cerebrovascular disease, unspecified: Secondary | ICD-10-CM | POA: Diagnosis not present

## 2015-03-01 DIAGNOSIS — I25119 Atherosclerotic heart disease of native coronary artery with unspecified angina pectoris: Secondary | ICD-10-CM | POA: Diagnosis not present

## 2015-03-01 LAB — CBC
HEMATOCRIT: 31.8 % — AB (ref 36.0–46.0)
Hemoglobin: 10.6 g/dL — ABNORMAL LOW (ref 12.0–15.0)
MCH: 32.8 pg (ref 26.0–34.0)
MCHC: 33.3 g/dL (ref 30.0–36.0)
MCV: 98.5 fL (ref 78.0–100.0)
PLATELETS: 144 10*3/uL — AB (ref 150–400)
RBC: 3.23 MIL/uL — AB (ref 3.87–5.11)
RDW: 13.2 % (ref 11.5–15.5)
WBC: 3.3 10*3/uL — AB (ref 4.0–10.5)

## 2015-03-01 LAB — BASIC METABOLIC PANEL
Anion gap: 6 (ref 5–15)
BUN: 45 mg/dL — AB (ref 6–20)
CHLORIDE: 113 mmol/L — AB (ref 101–111)
CO2: 21 mmol/L — AB (ref 22–32)
CREATININE: 1.37 mg/dL — AB (ref 0.44–1.00)
Calcium: 8.4 mg/dL — ABNORMAL LOW (ref 8.9–10.3)
GFR calc non Af Amer: 36 mL/min — ABNORMAL LOW (ref >60)
GFR, EST AFRICAN AMERICAN: 41 mL/min — AB (ref >60)
Glucose, Bld: 105 mg/dL — ABNORMAL HIGH (ref 65–99)
POTASSIUM: 4.5 mmol/L (ref 3.5–5.1)
SODIUM: 140 mmol/L (ref 135–145)

## 2015-03-01 LAB — GLUCOSE, CAPILLARY
GLUCOSE-CAPILLARY: 182 mg/dL — AB (ref 65–99)
GLUCOSE-CAPILLARY: 67 mg/dL (ref 65–99)
Glucose-Capillary: 62 mg/dL — ABNORMAL LOW (ref 65–99)

## 2015-03-01 MED ORDER — INSULIN DETEMIR 100 UNIT/ML ~~LOC~~ SOLN: 4.0000 [IU] | Freq: Every day | SUBCUTANEOUS | Status: DC

## 2015-03-01 MED ORDER — POLYETHYLENE GLYCOL 3350 17 G PO PACK: 17.0000 g | PACK | Freq: Every day | ORAL | Status: DC

## 2015-03-01 NOTE — Discharge Instructions (Signed)
Diabetes Mellitus and Food It is important for you to manage your blood sugar (glucose) level. Your blood glucose level can be greatly affected by what you eat. Eating healthier foods in the appropriate amounts throughout the day at about the same time each day will help you control your blood glucose level. It can also help slow or prevent worsening of your diabetes mellitus. Healthy eating may even help you improve the level of your blood pressure and reach or maintain a healthy weight.  General recommendations for healthful eating and cooking habits include:  Eating meals and snacks regularly. Avoid going long periods of time without eating to lose weight.  Eating a diet that consists mainly of plant-based foods, such as fruits, vegetables, nuts, legumes, and whole grains.  Using low-heat cooking methods, such as baking, instead of high-heat cooking methods, such as deep frying. Work with your dietitian to make sure you understand how to use the Nutrition Facts information on food labels. HOW CAN FOOD AFFECT ME? Carbohydrates Carbohydrates affect your blood glucose level more than any other type of food. Your dietitian will help you determine how many carbohydrates to eat at each meal and teach you how to count carbohydrates. Counting carbohydrates is important to keep your blood glucose at a healthy level, especially if you are using insulin or taking certain medicines for diabetes mellitus. Alcohol Alcohol can cause sudden decreases in blood glucose (hypoglycemia), especially if you use insulin or take certain medicines for diabetes mellitus. Hypoglycemia can be a life-threatening condition. Symptoms of hypoglycemia (sleepiness, dizziness, and disorientation) are similar to symptoms of having too much alcohol.  If your health care provider has given you approval to drink alcohol, do so in moderation and use the following guidelines:  Women should not have more than one drink per day, and men  should not have more than two drinks per day. One drink is equal to:  12 oz of beer.  5 oz of wine.  1 oz of hard liquor.  Do not drink on an empty stomach.  Keep yourself hydrated. Have water, diet soda, or unsweetened iced tea.  Regular soda, juice, and other mixers might contain a lot of carbohydrates and should be counted. WHAT FOODS ARE NOT RECOMMENDED? As you make food choices, it is important to remember that all foods are not the same. Some foods have fewer nutrients per serving than other foods, even though they might have the same number of calories or carbohydrates. It is difficult to get your body what it needs when you eat foods with fewer nutrients. Examples of foods that you should avoid that are high in calories and carbohydrates but low in nutrients include:  Trans fats (most processed foods list trans fats on the Nutrition Facts label).  Regular soda.  Juice.  Candy.  Sweets, such as cake, pie, doughnuts, and cookies.  Fried foods. WHAT FOODS CAN I EAT? Eat nutrient-rich foods, which will nourish your body and keep you healthy. The food you should eat also will depend on several factors, including:  The calories you need.  The medicines you take.  Your weight.  Your blood glucose level.  Your blood pressure level.  Your cholesterol level. You should eat a variety of foods, including:  Protein.  Lean cuts of meat.  Proteins low in saturated fats, such as fish, egg whites, and beans. Avoid processed meats.  Fruits and vegetables.  Fruits and vegetables that may help control blood glucose levels, such as apples, mangoes, and  yams.  Dairy products.  Choose fat-free or low-fat dairy products, such as milk, yogurt, and cheese.  Grains, bread, pasta, and rice.  Choose whole grain products, such as multigrain bread, whole oats, and brown rice. These foods may help control blood pressure.  Fats.  Foods containing healthful fats, such as nuts,  avocado, olive oil, canola oil, and fish. DOES EVERYONE WITH DIABETES MELLITUS HAVE THE SAME MEAL PLAN? Because every person with diabetes mellitus is different, there is not one meal plan that works for everyone. It is very important that you meet with a dietitian who will help you create a meal plan that is just right for you.   This information is not intended to replace advice given to you by your health care provider. Make sure you discuss any questions you have with your health care provider.   Document Released: 11/19/2004 Document Revised: 03/15/2014 Document Reviewed: 01/19/2013 Elsevier Interactive Patient Education 2016 [employer] Dysphagia Diet Level 3, Mechanically Advanced The dysphagia level 3 diet includes foods that are soft, moist, and can be chopped into 1-inch chunks. This diet is helpful for people with mild swallowing difficulties. It reduces the risk of food getting caught in the windpipe, trachea, or lungs. WHAT DO I NEED TO KNOW ABOUT THIS DIET?  You may eat foods that are soft and moist.  If you were on the dysphagia level 1 or level 2 diets, you may eat any of the foods included on those lists.  Avoid foods that are dry, hard, sticky, chewy, coarse, and crunchy. Also avoid large cuts of food.  Take small bites. Each bite should contain 1 inch or less of food.  Thicken liquids if instructed by your health care provider. Follow your health care provider's instructions on how to do this and to what consistency.  See your dietitian or speech language pathologist regularly for help with your dietary changes. WHAT FOODS CAN I EAT? Grains Moist breads without nuts or seeds. Biscuits, muffins, pancakes, and waffles well-moistened with syrup, jelly, margarine, or butter. Smooth cereals with plenty of milk to moisten them. Moist bread stuffing. Moist rice. Vegetables All cooked, soft vegetables. Shredded lettuce. Tender fried potatoes. Fruits All canned and cooked  fruits. Soft, peeled fresh fruits, such as peaches, nectarines, kiwis, cantaloupe, honeydew melon, and watermelon without seeds. Soft berries, such as strawberries. Meat and Other Protein Sources Moist ground or finely diced or sliced meats. Solid, tender cuts of meat. Meatloaf. Hamburger with a bun. Sausage patty. Deli thin-sliced lunch meat. Chicken, egg, or tuna salad sandwich. Sloppy joe. Moist fish. Eggs prepared any way. Casseroles with small chunks of meats, ground meats, or tender meats. Dairy Cheese spreads without coarse large chunks. Shredded cheese. Cheese slices. Cottage cheese. Milk at the right texture. Smooth frappes. Yogurt without nuts or coconut. Ask your health care provider whether you can have frozen desserts (such as malts or milk shakes) and thin liquids. Sweets/Desserts Soft, smooth, moist desserts. Non-chewy, smooth candy. Jam. Jelly. Honey. Preserves. Ask your health care provider whether you can have frozen desserts. Fats and Oils Butter. Oils. Margarine. Mayonnaise. Gravy. Spreads. Other All seasonings and sweeteners. All sauces without large chunks. The items listed above may not be a complete list of recommended foods or beverages. Contact your dietitian for more options. WHAT FOODS ARE NOT RECOMMENDED? Grains Coarse or dry cereals. Dry breads. Toast. Crackers. Tough, crusty breads, such French bread and baguettes. Tough, crisp fried potatoes. Potato skins. Dry bread stuffing. Granola. Popcorn. Chips. Vegetables All  raw vegetables except shredded lettuce. Cooked corn. Rubbery or stiff cooked vegetables. Stringy vegetables, such as celery. Fruits Hard fruits that are difficult to chew, such as apples or pears. Stringy, high-pulp fruits, such as pineapple, papaya, or mango. Fruits with tough skins, such as grapes. Coconut. All dried fruits. Fruit leather. Fruit roll-ups. Fruit snacks. Meat and Other Protein Sources Dry or tough meats or poultry. Dry fish. Fish with  bones. Peanut butter. All nuts and seeds. Dairy  Any with nuts, seeds, chocolate chips, dried fruit, coconut, or pineapple. Sweets/Desserts Dry cakes. Chewy or dry cookies. Any with nuts, seeds, dry fruits, coconut, pineapple, or anything dry, sticky, or hard. Chewy caramel. Licorice. Taffy-type candies. Ask your health care provider whether you can have frozen desserts. Fats and Oils Any with chunks, nuts, seeds, or pineapple. Olives. Angie Fava. Other Soups with tough or large chunks of meats, poultry, or vegetables. Corn or clam chowder. The items listed above may not be a complete list of foods and beverages to avoid. Contact your dietitian for more information.   This information is not intended to replace advice given to you by your health care provider. Make sure you discuss any questions you have with your health care provider.   Document Released: 02/22/2005 Document Revised: 03/15/2014 Document Reviewed: 02/05/2013 Elsevier Interactive Patient Education 2016 [employer]. Potassium Content of Foods Potassium is a mineral found in many foods and drinks. It helps keep fluids and minerals balanced in your body and affects how steadily your heart beats. Potassium also helps control your blood pressure and keep your muscles and nervous system healthy. Certain health conditions and medicines may change the balance of potassium in your body. When this happens, you can help balance your level of potassium through the foods that you do or do not eat. Your health care provider or dietitian may recommend an amount of potassium that you should have each day. The following lists of foods provide the amount of potassium (in parentheses) per serving in each item. HIGH IN POTASSIUM  The following foods and beverages have 200 mg or more of potassium per serving:  Apricots, 2 raw or 5 dry (200 mg).  Artichoke, 1 medium (345 mg).  Avocado, raw,  each (245 mg).  Banana, 1 medium (425 mg).  Beans,  lima, or baked beans, canned,  cup (280 mg).  Beans, white, canned,  cup (595 mg).  Beef roast, 3 oz (320 mg).  Beef, ground, 3 oz (270 mg).  Beets, raw or cooked,  cup (260 mg).  Bran muffin, 2 oz (300 mg).  Broccoli,  cup (230 mg).  Brussels sprouts,  cup (250 mg).  Cantaloupe,  cup (215 mg).  Cereal, 100% bran,  cup (200-400 mg).  Cheeseburger, single, fast food, 1 each (225-400 mg).  Chicken, 3 oz (220 mg).  Clams, canned, 3 oz (535 mg).  Crab, 3 oz (225 mg).  Dates, 5 each (270 mg).  Dried beans and peas,  cup (300-475 mg).  Figs, dried, 2 each (260 mg).  Fish: halibut, tuna, cod, snapper, 3 oz (480 mg).  Fish: salmon, haddock, swordfish, perch, 3 oz (300 mg).  Fish, tuna, canned 3 oz (200 mg).  [place] fries, fast food, 3 oz (470 mg).  Granola with fruit and nuts,  cup (200 mg).  Grapefruit juice,  cup (200 mg).  Greens, beet,  cup (655 mg).  Honeydew melon,  cup (200 mg).  Kale, raw, 1 cup (300 mg).  Kiwi, 1 medium (240 mg).  Kohlrabi,  rutabaga, parsnips,  cup (280 mg).  Lentils,  cup (365 mg).  Mango, 1 each (325 mg).  Milk, chocolate, 1 cup (420 mg).  Milk: nonfat, low-fat, whole, buttermilk, 1 cup (350-380 mg).  Molasses, 1 Tbsp (295 mg).  Mushrooms,  cup (280) mg.  Nectarine, 1 each (275 mg).  Nuts: almonds, peanuts, hazelnuts, [place], cashew, mixed, 1 oz (200 mg).  Nuts, pistachios, 1 oz (295 mg).  Orange, 1 each (240 mg).  Orange juice,  cup (235 mg).  Papaya, medium,  fruit (390 mg).  Peanut butter, chunky, 2 Tbsp (240 mg).  Peanut butter, smooth, 2 Tbsp (210 mg).  Pear, 1 medium (200 mg).  Pomegranate, 1 whole (400 mg).  Pomegranate juice,  cup (215 mg).  Pork, 3 oz (350 mg).  Potato chips, salted, 1 oz (465 mg).  Potato, baked with skin, 1 medium (925 mg).  Potatoes, boiled,  cup (255 mg).  Potatoes, mashed,  cup (330 mg).  Prune juice,  cup (370 mg).  Prunes, 5 each (305  mg).  Pudding, chocolate,  cup (230 mg).  Pumpkin, canned,  cup (250 mg).  Raisins, seedless,  cup (270 mg).  Seeds, sunflower or pumpkin, 1 oz (240 mg).  Soy milk, 1 cup (300 mg).  Spinach,  cup (420 mg).  Spinach, canned,  cup (370 mg).  Sweet potato, baked with skin, 1 medium (450 mg).  Swiss chard,  cup (480 mg).  Tomato or vegetable juice,  cup (275 mg).  Tomato sauce or puree,  cup (400-550 mg).  Tomato, raw, 1 medium (290 mg).  Tomatoes, canned,  cup (200-300 mg).  [place], 3 oz (250 mg).  Wheat germ, 1 oz (250 mg).  Winter squash,  cup (250 mg).  Yogurt, plain or fruited, 6 oz (260-435 mg).  Zucchini,  cup (220 mg). MODERATE IN POTASSIUM The following foods and beverages have 50-200 mg of potassium per serving:  Apple, 1 each (150 mg).  Apple juice,  cup (150 mg).  Applesauce,  cup (90 mg).  Apricot nectar,  cup (140 mg).  Asparagus, small spears,  cup or 6 spears (155 mg).  Bagel, cinnamon raisin, 1 each (130 mg).  Bagel, egg or plain, 4 in., 1 each (70 mg).  Beans, green,  cup (90 mg).  Beans, yellow,  cup (190 mg).  Beer, regular, 12 oz (100 mg).  Beets, canned,  cup (125 mg).  Blackberries,  cup (115 mg).  Blueberries,  cup (60 mg).  Bread, whole wheat, 1 slice (70 mg).  Broccoli, raw,  cup (145 mg).  Cabbage,  cup (150 mg).  Carrots, cooked or raw,  cup (180 mg).  Cauliflower, raw,  cup (150 mg).  Celery, raw,  cup (155 mg).  Cereal, bran flakes, cup (120-150 mg).  Cheese, cottage,  cup (110 mg).  Cherries, 10 each (150 mg).  Chocolate, 1 oz bar (165 mg).  Coffee, brewed 6 oz (90 mg).  Corn,  cup or 1 ear (195 mg).  Cucumbers,  cup (80 mg).  Egg, large, 1 each (60 mg).  Eggplant,  cup (60 mg).  Endive, raw, cup (80 mg).  English muffin, 1 each (65 mg).  Fish, orange roughy, 3 oz (150 mg).  Frankfurter, beef or pork, 1 each (75 mg).  Fruit cocktail,  cup (115  mg).  Grape juice,  cup (170 mg).  Grapefruit,  fruit (175 mg).  Grapes,  cup (155 mg).  Greens: kale, turnip, collard,  cup (110-150 mg).  Ice cream or frozen  yogurt, chocolate,  cup (175 mg).  Ice cream or frozen yogurt, vanilla,  cup (120-150 mg).  Lemons, limes, 1 each (80 mg).  Lettuce, all types, 1 cup (100 mg).  Mixed vegetables,  cup (150 mg).  Mushrooms, raw,  cup (110 mg).  Nuts: walnuts, pecans, or macadamia, 1 oz (125 mg).  Oatmeal,  cup (80 mg).  Okra,  cup (110 mg).  Onions, raw,  cup (120 mg).  Peach, 1 each (185 mg).  Peaches, canned,  cup (120 mg).  Pears, canned,  cup (120 mg).  Peas, green, frozen,  cup (90 mg).  Peppers, green,  cup (130 mg).  Peppers, red,  cup (160 mg).  Pineapple juice,  cup (165 mg).  Pineapple, fresh or canned,  cup (100 mg).  Plums, 1 each (105 mg).  Pudding, vanilla,  cup (150 mg).  Raspberries,  cup (90 mg).  Rhubarb,  cup (115 mg).  Rice, wild,  cup (80 mg).  Shrimp, 3 oz (155 mg).  Spinach, raw, 1 cup (170 mg).  Strawberries,  cup (125 mg).  Summer squash  cup (175-200 mg).  Swiss chard, raw, 1 cup (135 mg).  Tangerines, 1 each (140 mg).  Tea, brewed, 6 oz (65 mg).  Turnips,  cup (140 mg).  Watermelon,  cup (85 mg).  Wine, red, table, 5 oz (180 mg).  Wine, white, table, 5 oz (100 mg). LOW IN POTASSIUM The following foods and beverages have less than 50 mg of potassium per serving.  Bread, white, 1 slice (30 mg).  Carbonated beverages, 12 oz (less than 5 mg).  Cheese, 1 oz (20-30 mg).  Cranberries,  cup (45 mg).  Cranberry juice cocktail,  cup (20 mg).  Fats and oils, 1 Tbsp (less than 5 mg).  Hummus, 1 Tbsp (32 mg).  Nectar: papaya, mango, or pear,  cup (35 mg).  Rice, white or brown,  cup (50 mg).  Spaghetti or macaroni,  cup cooked (30 mg).  Tortilla, flour or corn, 1 each (50 mg).  Waffle, 4 in., 1 each (50 mg).  Water chestnuts,   cup (40 mg).   This information is not intended to replace advice given to you by your health care provider. Make sure you discuss any questions you have with your health care provider.   Document Released: 10/06/2004 Document Revised: 02/27/2013 Document Reviewed: 01/19/2013 Elsevier Interactive Patient Education [employer].

## 2015-03-01 NOTE — Progress Notes (Signed)
Hypoglycemic Event  CBG: 62@ 8:22  Treatment: 15 GM carbohydrate snack  Symptoms: None  Follow-up CBG: Time:9:22 CBG Result:182  Possible Reasons for Event: Inadequate meal intake  Comments/MD notified:notified via text    Saidi Santacroce, Arville Go

## 2015-03-02 NOTE — Discharge Summary (Addendum)
[facility] Discharge Summary   Patient: Carmen [name]    [id number] PCP: Carmen Greenland, MD   DOB: Jul 10, 1933 Date of admission: 02/26/2015   Date of discharge: 03/01/2015   Discharge Diagnoses:  Principal Problem:   Acute on chronic renal failure ([place]) Active Problems:   DM (diabetes mellitus), type 2, uncontrolled, periph vascular complic ([place])   Pacemaker-[employer]   CVA (cerebral infarction)   CKD (chronic kidney disease), stage III   HTN (hypertension)   Dehydration   Protein-calorie malnutrition, severe (HCC)   AKI (acute kidney injury) ([place])   Dehydration, moderate   Failure to thrive in adult   Acute cystitis without hematuria   DNR (do not resuscitate) discussion   Palliative care encounter   Acute on chronic kidney failure ([place])   Constipation   Recommendations for Outpatient Follow-up:  1. Follow up with PCP for repeat BMP and adjustment of medication for diabetes   Diet recommendation: diabetic diet, low potassium and drink adequate fluids.  Activity: The patient is advised to gradually reintroduce usual activities.  Discharge Condition: good  History of present illness: As per the H and P dictated on admission, "Carmen [name] is a 81 y.o. female, with HTN, PVD with claudication, CAD s/p pacemaker placement, DM presents to the ER for anorexia and weight loss. The patient was hospitalized in November with acute kidney injury, acute enteritis, and constipation with stool impaction. Over the next several weeks she began to feel poorly again, eating less and getting out of bed less. She developed URI symptoms (cough and congestion). Over the last few days the patient stopped eating even though she was being fed by her family. In the ER today her creatinine has climbed from 1.5 on 01/16/15 to 1.98 today (02/26/15). U/A is positive for UTI. CXR is clear. We will admit her to obs for hydration, treatment of UTI, Palliative Medicine Consultation.  The patient appears to be improving with IV fluids in the ER. She has perked up and is now eating on her own. She denies any pain."  Hospital Course:  Summary of her active problems in the hospital is as following.  1. Acute on chronic renal failure ([place])  2. Hyperkalemia  Patient presents with poor oral intake. Renal function has worsened and improved with hydration.  Potassium was elevated, Received Kayexalate. Repeat potassium is significantly improved. Recommend low potassium diet.  3. Diabetes mellitus type 2 with peripheral vascular disease Uncontrolled.  On admission the patient was started on insulin drip for uncontrolled hyperglycemia. Patient was started on levemir in the hospital but due to poor oral intake she had an hypoglycemic event. Will recommend patient to discuss with PCP regarding further management of diabetes and at present discharge on home medication .  4. Failure to thrive.  Palliative care was consulted. Appreciate input. Patient will be going home on home with hospice.  5. Protein calorie malnutrition. Continue nutritional supplements. Appreciate input from dietitian  6. Prior CVA. Continuing aspirin and Plavix.  7. UTI. Treated with ceftriaxone. Last dose 02/28/2015  8. Constipation: Discharged on miralax.  All other chronic medical condition were stable during the hospitalization.  Patient was seen by physical therapy, who recommended home health, but since family has decided for hospice, hospice was arranged by Education officer, museum and case Freight forwarder. On the day of the discharge the patient's renal function improved, and no other acute medical condition were reported by patient. the patient was felt safe to be discharge at home with hospice.  Procedures and Results:  none   Consultations:  none  Discharge Exam: Filed Weights   02/27/15 0559 02/27/15 2025 03/01/15 0450  Weight: 41.051 kg (90 lb 8 oz) 41 kg (90 lb 6.2 oz) 40.1 kg (88 lb  6.5 oz)   Filed Vitals:   02/28/15 2110 03/01/15 0450  BP: 198/56 198/54  Pulse: 71 63  Temp: 97.8 F (36.6 C) 98.1 F (36.7 C)  Resp: 18 16   General: Appear in no distress, no Rash; Oral Mucosa moist. Cardiovascular: S1 and S2 Present, no Murmur, no JVD Respiratory: Bilateral Air entry present and Clear to Auscultation, no Crackles, no wheezes Abdomen: Bowel Sound present, Soft and no tenderness Extremities: no Pedal edema, no calf tenderness Neurology: Grossly no focal neuro deficit.  DISCHARGE MEDICATION: Discharge Instructions    Diet Carb Modified    Complete by:  As directed      Diet low potassium    Complete by:  As directed      Discharge instructions    Complete by:  As directed   Maintain adequate fluid intake. Avoid diet high in potassium. Follow up with PCP in 1 week  Regarding diabetes mellitus.     Increase activity slowly    Complete by:  As directed           Discharge Medication List as of 03/01/2015 10:30 AM    START taking these medications   Details  polyethylene glycol (MIRALAX / GLYCOLAX) packet Take 17 g by mouth daily., Starting 03/01/2015, Until Discontinued, Normal      CONTINUE these medications which have NOT CHANGED   Details  aspirin EC 81 MG tablet Take 81 mg by mouth daily., Until Discontinued, Historical Med    clopidogrel (PLAVIX) 75 MG tablet Take 75 mg by mouth daily., Until Discontinued, Historical Med    feeding supplement, GLUCERNA SHAKE, (GLUCERNA SHAKE) LIQD Take 237 mLs by mouth 2 (two) times daily between meals., Starting 01/16/2015, Until Discontinued, Print    guaiFENesin (MUCINEX) 600 MG 12 hr tablet Take 1 tablet (600 mg total) by mouth 2 (two) times daily as needed., Starting 02/15/2015, Until Discontinued, Print    hydrALAZINE (APRESOLINE) 100 MG tablet Take 1 tablet (100 mg total) by mouth 4 (four) times daily., Starting 01/16/2015, Until Discontinued, Print    linagliptin (TRADJENTA) 5 MG TABS tablet Take 1  tablet (5 mg total) by mouth daily., Starting 12/01/2014, Until Discontinued, No Print    metoprolol tartrate (LOPRESSOR) 25 MG tablet Take 25 mg by mouth 2 (two) times daily., Until Discontinued, Historical Med    Omega-3 Fatty Acids (FISH OIL) 1000 MG CAPS Take 1,000 mg by mouth 2 (two) times daily. , Until Discontinued, Historical Med    pantoprazole (PROTONIX) 40 MG tablet Take 1 tablet (40 mg total) by mouth daily., Starting 01/16/2015, Until Discontinued, Print    isosorbide mononitrate (IMDUR) 120 MG 24 hr tablet Take 1 tablet (120 mg total) by mouth daily., Starting 12/12/2012, Until Discontinued, Normal      STOP taking these medications     potassium chloride (K-DUR) 10 MEQ tablet        Allergies  Allergen Reactions  . Norvasc [Amlodipine Besylate] Swelling  . Peanut-Containing Drug Products Other (See Comments)    Stomach pain   Follow-up Information    Follow up with SANDERS,ROBYN N, MD. Schedule an appointment as soon as possible for a visit in 1 week.   Specialty:  Internal Medicine   Why:  BMP  Contact information:   [address] [place] [address] [phone]       The results of significant diagnostics from this hospitalization (including imaging, microbiology, ancillary and laboratory) are listed below for reference.    Significant Diagnostic Studies: Dg Chest 2 View  02/26/2015  CLINICAL DATA:  Cough and congestion EXAM: CHEST  2 VIEW COMPARISON:  February 22, 2015 FINDINGS: There is no edema or consolidation. Heart is upper normal in size with pulmonary vascularity within normal limits. Pacemaker leads are attached to the right atrium and right ventricle. There is calcification in the aorta. There is a stent in the proximal right subclavian artery region. No adenopathy. Bones appear osteoporotic. There is arthropathy in the right shoulder. IMPRESSION: No edema or consolidation. Electronically Signed   By: Lowella Grip III M.D.   On:  02/26/2015 09:18   Dg Chest 2 View  02/22/2015  CLINICAL DATA:  Cough and weakness. EXAM: CHEST  2 VIEW COMPARISON:  02/15/2015 FINDINGS: Left chest wall pacer device is noted with leads in the right atrial appendage and right ventricle. Aortic atherosclerosis noted. Mild cardiac enlargement. No pleural effusion or edema. IMPRESSION: 1. Mild cardiac enlargement. 2. Aortic atherosclerosis Electronically Signed   By: Kerby Moors M.D.   On: 02/22/2015 18:57   Dg Chest 2 View  02/15/2015  CLINICAL DATA:  Left-sided chest pain and shortness of Breath for 3-4 weeks, history of tobacco use EXAM: CHEST - 2 VIEW COMPARISON:  01/13/2015 FINDINGS: Cardiac shadow is stable. The lungs are well aerated bilaterally. A pacing device is again seen and stable. No focal bony abnormality is seen. IMPRESSION: No acute abnormality noted. Electronically Signed   By: Inez Catalina M.D.   On: 02/15/2015 16:05   Ct Head Wo Contrast  02/15/2015  CLINICAL DATA:  81 year old with personal history of stroke, presenting with weakness over the past several days, with difficulty moving and straightening the right arm. EXAM: CT HEAD WITHOUT CONTRAST TECHNIQUE: Contiguous axial images were obtained from the base of the skull through the vertex without intravenous contrast. COMPARISON:  Multiple prior head CTs dating back to 04/11/2009, most recently 10/28/2014. FINDINGS: Moderate cortical and deep atrophy and mild cerebellar atrophy, stable dating back to 2013 but slightly progressive since 2011. Old lacunar strokes involving the left basal ganglia, unchanged. Asymmetric enlargement of the frontal horn of the left lateral ventricle, unchanged, due to encephalomalacia in the adjacent left frontal lobe at the site of a previously thrombosed AVM, with associated mild dystrophic calcification. Moderate changes of small vessel disease of the white matter diffusely, unchanged. No mass lesion. No midline shift. No acute hemorrhage or hematoma.  No extra-axial fluid collections. No evidence of acute infarction. No skull fracture or other focal osseous abnormality involving the skull. Mucosal thickening involving the maxillary sinuses, opacification of multiple bilateral ethmoid air cells and mucosal thickening with air-fluid levels in the sphenoid sinuses bilaterally. Bilateral mastoid air cells and middle ear cavities well-aerated. Severe bilateral carotid siphon and left vertebral artery atherosclerosis. IMPRESSION: 1. No acute intracranial abnormality. 2. Stable moderate generalized atrophy, moderate chronic microvascular ischemic changes of the white matter, old lacunar strokes in the left basal ganglia, and encephalomalacia in the left frontal lobe white matter. 3. Chronic bilateral maxillary, ethmoid and sphenoid sinus disease with superimposed acute disease in the sphenoid sinuses. Electronically Signed   By: Evangeline Dakin M.D.   On: 02/15/2015 18:15   Portable Chest 1 View  02/26/2015  CLINICAL DATA:  Productive cough.  EXAM: PORTABLE CHEST 1 VIEW COMPARISON:  02/26/2015. FINDINGS: Questionable nodular densities noted in the right upper lobe and right lower lung. These are not present on prior recent exams. These could be extrinsic to the patient. Repeat PA and lateral chest x-ray is suggested. No focal infiltrate. No pleural effusion or pneumothorax. Cardiac pacer noted in stable position. Stable cardiomegaly. No pulmonary venous congestion. Right subclavian stent noted. Bilateral subclavian artery atherosclerotic vascular disease . IMPRESSION: 1. Questionable nodular densities in the right upper lobe and right lung base. These are not present on prior exams. These may be extrinsic to the patient. Repeat PA and lateral chest x-ray is suggested. Lungs are otherwise clear. 2.  Cardiac pacer stable position.  Stable cardiomegaly. Electronically Signed   By: Marcello Moores  Register   On: 02/26/2015 16:10   Dg Abd Portable 1v  02/26/2015  CLINICAL  DATA:  Not eating, drinking or having bowel movements. EXAM: PORTABLE ABDOMEN - 1 VIEW COMPARISON:  CT 01/14/2015 FINDINGS: No evidence of ileus, obstruction or visible free air. Moderate amount of fecal matter in the colon, within normal limits. Previous lumbosacral fusion. Extensive atherosclerosis with left iliac artery stent. Old compression deformities at L1, L2 and L4. IMPRESSION: No acute radiographic finding. Amount of fecal matter within normal limits. Electronically Signed   By: Nelson Chimes M.D.   On: 02/26/2015 11:40    Microbiology: Recent Results (from the past 240 hour(s))  Urine culture     Status: None   Collection Time: 02/26/15 10:49 AM  Result Value Ref Range Status   Specimen Description URINE, CLEAN CATCH  Final   Special Requests NONE  Final   Culture MULTIPLE SPECIES PRESENT, SUGGEST RECOLLECTION  Final   Report Status 02/28/2015 FINAL  Final     Labs: CBC:  Recent Labs Lab 02/26/15 0858 02/28/15 0536 03/01/15 0445  WBC 4.2 3.9* 3.3*  NEUTROABS 2.4  --   --   HGB 14.7 11.3* 10.6*  HCT 43.9 33.8* 31.8*  MCV 99.1 99.7 98.5  PLT 202 156 [id number]*   Basic Metabolic Panel:  Recent Labs Lab 02/26/15 0858  02/27/15 0525 02/27/15 0748 02/28/15 0536 02/28/15 1335 02/28/15 1518 03/01/15 0445  NA 140  < > 136 135 139  --  139 140  K 5.1  < > 4.5 5.1 5.3* 5.3* 4.7 4.5  CL 106  < > 109 108 114*  --  114* 113*  CO2 24  < > 18* 19* 20*  --  18* 21*  GLUCOSE 204*  < > 177* 148* 157*  --  287* 105*  BUN 102*  < > 86* 85* 70*  --  58* 45*  CREATININE 1.98*  < > 2.17* 2.16* 2.22*  --  1.80* 1.37*  CALCIUM 9.4  < > 8.4* 8.4* 8.5*  --  8.3* 8.4*  MG 2.5*  --   --   --   --   --   --   --   PHOS 5.3*  --   --   --   --   --   --   --   < > = values in this interval not displayed. Liver Function Tests:  Recent Labs Lab 02/26/15 0858  AST 45*  ALT 38  ALKPHOS 93  BILITOT 0.5  PROT 6.8  ALBUMIN 3.5    Recent Labs Lab 02/26/15 0858  LIPASE 14   No results  for input(s): AMMONIA in the last 168 hours.  Cardiac Enzymes: No results for input(s):  CKTOTAL, CKMB, CKMBINDEX, TROPONINI in the last 168 hours. BNP (last 3 results)  Recent Labs  09/11/14 1522  BNP 3381.1*    ProBNP (last 3 results) No results for input(s): PROBNP in the last 8760 hours.  CBG:  Recent Labs Lab 02/28/15 1702 02/28/15 2107 03/01/15 0814 03/01/15 0845 03/01/15 0920  GLUCAP 200* 185* 62* 67 182*    Time spent: 30 minutes  Signed:  Aishah Teffeteller  [facility] 03/01/2015, 7:18 PM

## 2015-03-27 DIAGNOSIS — I2510 Atherosclerotic heart disease of native coronary artery without angina pectoris: Secondary | ICD-10-CM | POA: Diagnosis not present

## 2015-03-27 DIAGNOSIS — I1310 Hypertensive heart and chronic kidney disease without heart failure, with stage 1 through stage 4 chronic kidney disease, or unspecified chronic kidney disease: Secondary | ICD-10-CM | POA: Diagnosis not present

## 2015-03-27 DIAGNOSIS — N183 Chronic kidney disease, stage 3 (moderate): Secondary | ICD-10-CM | POA: Diagnosis not present

## 2015-05-06 DIAGNOSIS — N08 Glomerular disorders in diseases classified elsewhere: Secondary | ICD-10-CM | POA: Diagnosis not present

## 2015-05-06 DIAGNOSIS — Z0000 Encounter for general adult medical examination without abnormal findings: Secondary | ICD-10-CM | POA: Diagnosis not present

## 2015-05-06 DIAGNOSIS — N183 Chronic kidney disease, stage 3 (moderate): Secondary | ICD-10-CM | POA: Diagnosis not present

## 2015-05-06 DIAGNOSIS — E46 Unspecified protein-calorie malnutrition: Secondary | ICD-10-CM | POA: Diagnosis not present

## 2015-05-06 DIAGNOSIS — E1122 Type 2 diabetes mellitus with diabetic chronic kidney disease: Secondary | ICD-10-CM | POA: Diagnosis not present

## 2015-05-06 DIAGNOSIS — I1310 Hypertensive heart and chronic kidney disease without heart failure, with stage 1 through stage 4 chronic kidney disease, or unspecified chronic kidney disease: Secondary | ICD-10-CM | POA: Diagnosis not present

## 2015-05-21 DIAGNOSIS — R531 Weakness: Secondary | ICD-10-CM | POA: Diagnosis not present

## 2015-06-07 DIAGNOSIS — R0602 Shortness of breath: Secondary | ICD-10-CM | POA: Diagnosis not present

## 2015-06-07 DIAGNOSIS — I509 Heart failure, unspecified: Secondary | ICD-10-CM | POA: Diagnosis not present

## 2015-06-07 DIAGNOSIS — E1101 Type 2 diabetes mellitus with hyperosmolarity with coma: Secondary | ICD-10-CM | POA: Diagnosis not present

## 2015-06-16 DIAGNOSIS — Z87891 Personal history of nicotine dependence: Secondary | ICD-10-CM | POA: Diagnosis not present

## 2015-06-16 DIAGNOSIS — I10 Essential (primary) hypertension: Secondary | ICD-10-CM | POA: Diagnosis not present

## 2015-06-16 DIAGNOSIS — Z950 Presence of cardiac pacemaker: Secondary | ICD-10-CM | POA: Diagnosis not present

## 2015-06-16 DIAGNOSIS — I5032 Chronic diastolic (congestive) heart failure: Secondary | ICD-10-CM | POA: Diagnosis not present

## 2015-07-07 DIAGNOSIS — I509 Heart failure, unspecified: Secondary | ICD-10-CM | POA: Diagnosis not present

## 2015-07-07 DIAGNOSIS — E1101 Type 2 diabetes mellitus with hyperosmolarity with coma: Secondary | ICD-10-CM | POA: Diagnosis not present

## 2015-07-07 DIAGNOSIS — R0602 Shortness of breath: Secondary | ICD-10-CM | POA: Diagnosis not present

## 2015-08-07 DIAGNOSIS — I509 Heart failure, unspecified: Secondary | ICD-10-CM | POA: Diagnosis not present

## 2015-08-07 DIAGNOSIS — R0602 Shortness of breath: Secondary | ICD-10-CM | POA: Diagnosis not present

## 2015-08-07 DIAGNOSIS — E1101 Type 2 diabetes mellitus with hyperosmolarity with coma: Secondary | ICD-10-CM | POA: Diagnosis not present

## 2015-08-12 DIAGNOSIS — N183 Chronic kidney disease, stage 3 (moderate): Secondary | ICD-10-CM | POA: Diagnosis not present

## 2015-08-12 DIAGNOSIS — N08 Glomerular disorders in diseases classified elsewhere: Secondary | ICD-10-CM | POA: Diagnosis not present

## 2015-08-12 DIAGNOSIS — R634 Abnormal weight loss: Secondary | ICD-10-CM | POA: Diagnosis not present

## 2015-08-12 DIAGNOSIS — I2510 Atherosclerotic heart disease of native coronary artery without angina pectoris: Secondary | ICD-10-CM | POA: Diagnosis not present

## 2015-08-12 DIAGNOSIS — E1122 Type 2 diabetes mellitus with diabetic chronic kidney disease: Secondary | ICD-10-CM | POA: Diagnosis not present

## 2015-09-06 DIAGNOSIS — R0602 Shortness of breath: Secondary | ICD-10-CM | POA: Diagnosis not present

## 2015-09-06 DIAGNOSIS — E1101 Type 2 diabetes mellitus with hyperosmolarity with coma: Secondary | ICD-10-CM | POA: Diagnosis not present

## 2015-09-06 DIAGNOSIS — I509 Heart failure, unspecified: Secondary | ICD-10-CM | POA: Diagnosis not present

## 2015-10-07 DIAGNOSIS — I509 Heart failure, unspecified: Secondary | ICD-10-CM | POA: Diagnosis not present

## 2015-10-07 DIAGNOSIS — E1101 Type 2 diabetes mellitus with hyperosmolarity with coma: Secondary | ICD-10-CM | POA: Diagnosis not present

## 2015-10-07 DIAGNOSIS — R0602 Shortness of breath: Secondary | ICD-10-CM | POA: Diagnosis not present

## 2015-10-28 ENCOUNTER — Emergency Department (HOSPITAL_COMMUNITY): Payer: Commercial Managed Care - HMO

## 2015-10-28 ENCOUNTER — Observation Stay (HOSPITAL_COMMUNITY)
Admission: EM | Admit: 2015-10-28 | Discharge: 2015-10-30 | Disposition: A | Discharge status: Home / Self Care | Payer: Commercial Managed Care - HMO | Attending: Internal Medicine | Admitting: Internal Medicine

## 2015-10-28 ENCOUNTER — Other Ambulatory Visit: Payer: Self-pay

## 2015-10-28 ENCOUNTER — Encounter (HOSPITAL_COMMUNITY): Payer: Self-pay

## 2015-10-28 DIAGNOSIS — R531 Weakness: Secondary | ICD-10-CM | POA: Insufficient documentation

## 2015-10-28 DIAGNOSIS — Z8673 Personal history of transient ischemic attack (TIA), and cerebral infarction without residual deficits: Secondary | ICD-10-CM | POA: Insufficient documentation

## 2015-10-28 DIAGNOSIS — E875 Hyperkalemia: Secondary | ICD-10-CM | POA: Diagnosis not present

## 2015-10-28 DIAGNOSIS — E11649 Type 2 diabetes mellitus with hypoglycemia without coma: Secondary | ICD-10-CM | POA: Diagnosis not present

## 2015-10-28 DIAGNOSIS — Z950 Presence of cardiac pacemaker: Secondary | ICD-10-CM | POA: Diagnosis not present

## 2015-10-28 DIAGNOSIS — I495 Sick sinus syndrome: Secondary | ICD-10-CM | POA: Diagnosis not present

## 2015-10-28 DIAGNOSIS — I739 Peripheral vascular disease, unspecified: Secondary | ICD-10-CM | POA: Diagnosis not present

## 2015-10-28 DIAGNOSIS — E1165 Type 2 diabetes mellitus with hyperglycemia: Secondary | ICD-10-CM

## 2015-10-28 DIAGNOSIS — N183 Chronic kidney disease, stage 3 unspecified: Secondary | ICD-10-CM | POA: Diagnosis present

## 2015-10-28 DIAGNOSIS — N184 Chronic kidney disease, stage 4 (severe): Secondary | ICD-10-CM | POA: Insufficient documentation

## 2015-10-28 DIAGNOSIS — I130 Hypertensive heart and chronic kidney disease with heart failure and stage 1 through stage 4 chronic kidney disease, or unspecified chronic kidney disease: Secondary | ICD-10-CM | POA: Diagnosis not present

## 2015-10-28 DIAGNOSIS — Z7982 Long term (current) use of aspirin: Secondary | ICD-10-CM | POA: Insufficient documentation

## 2015-10-28 DIAGNOSIS — I5032 Chronic diastolic (congestive) heart failure: Secondary | ICD-10-CM | POA: Diagnosis not present

## 2015-10-28 DIAGNOSIS — J9811 Atelectasis: Secondary | ICD-10-CM | POA: Diagnosis not present

## 2015-10-28 DIAGNOSIS — Z794 Long term (current) use of insulin: Secondary | ICD-10-CM | POA: Insufficient documentation

## 2015-10-28 DIAGNOSIS — E876 Hypokalemia: Secondary | ICD-10-CM | POA: Diagnosis present

## 2015-10-28 DIAGNOSIS — Z7902 Long term (current) use of antithrombotics/antiplatelets: Secondary | ICD-10-CM | POA: Diagnosis not present

## 2015-10-28 DIAGNOSIS — E162 Hypoglycemia, unspecified: Secondary | ICD-10-CM | POA: Diagnosis present

## 2015-10-28 DIAGNOSIS — F17210 Nicotine dependence, cigarettes, uncomplicated: Secondary | ICD-10-CM | POA: Diagnosis not present

## 2015-10-28 DIAGNOSIS — I10 Essential (primary) hypertension: Secondary | ICD-10-CM | POA: Diagnosis present

## 2015-10-28 DIAGNOSIS — IMO0002 Reserved for concepts with insufficient information to code with codable children: Secondary | ICD-10-CM

## 2015-10-28 DIAGNOSIS — F4489 Other dissociative and conversion disorders: Secondary | ICD-10-CM | POA: Diagnosis not present

## 2015-10-28 DIAGNOSIS — R402421 Glasgow coma scale score 9-12, in the field [EMT or ambulance]: Secondary | ICD-10-CM | POA: Diagnosis not present

## 2015-10-28 DIAGNOSIS — R262 Difficulty in walking, not elsewhere classified: Secondary | ICD-10-CM | POA: Diagnosis not present

## 2015-10-28 DIAGNOSIS — E1151 Type 2 diabetes mellitus with diabetic peripheral angiopathy without gangrene: Secondary | ICD-10-CM

## 2015-10-28 DIAGNOSIS — I2510 Atherosclerotic heart disease of native coronary artery without angina pectoris: Secondary | ICD-10-CM | POA: Diagnosis not present

## 2015-10-28 DIAGNOSIS — E119 Type 2 diabetes mellitus without complications: Secondary | ICD-10-CM | POA: Diagnosis present

## 2015-10-28 DIAGNOSIS — Z66 Do not resuscitate: Secondary | ICD-10-CM | POA: Insufficient documentation

## 2015-10-28 DIAGNOSIS — E1122 Type 2 diabetes mellitus with diabetic chronic kidney disease: Secondary | ICD-10-CM | POA: Diagnosis not present

## 2015-10-28 DIAGNOSIS — I6789 Other cerebrovascular disease: Secondary | ICD-10-CM | POA: Diagnosis not present

## 2015-10-28 LAB — CBC
HCT: 40 % (ref 36.0–46.0)
HEMOGLOBIN: 13.2 g/dL (ref 12.0–15.0)
MCH: 31.4 pg (ref 26.0–34.0)
MCHC: 33 g/dL (ref 30.0–36.0)
MCV: 95 fL (ref 78.0–100.0)
Platelets: 165 10*3/uL (ref 150–400)
RBC: 4.21 MIL/uL (ref 3.87–5.11)
RDW: 13.8 % (ref 11.5–15.5)
WBC: 6.9 10*3/uL (ref 4.0–10.5)

## 2015-10-28 LAB — COMPREHENSIVE METABOLIC PANEL
ALT: 30 U/L (ref 14–54)
AST: 40 U/L (ref 15–41)
Albumin: 2.5 g/dL — ABNORMAL LOW (ref 3.5–5.0)
Alkaline Phosphatase: 71 U/L (ref 38–126)
Anion gap: 8 (ref 5–15)
BUN: 21 mg/dL — ABNORMAL HIGH (ref 6–20)
CHLORIDE: 101 mmol/L (ref 101–111)
CO2: 28 mmol/L (ref 22–32)
Calcium: 7.4 mg/dL — ABNORMAL LOW (ref 8.9–10.3)
Creatinine, Ser: 1.67 mg/dL — ABNORMAL HIGH (ref 0.44–1.00)
GFR, EST AFRICAN AMERICAN: 33 mL/min — AB (ref >60)
GFR, EST NON AFRICAN AMERICAN: 28 mL/min — AB (ref >60)
Glucose, Bld: 64 mg/dL — ABNORMAL LOW (ref 65–99)
Sodium: 137 mmol/L (ref 135–145)
Total Bilirubin: 0.6 mg/dL (ref 0.3–1.2)
Total Protein: 5.3 g/dL — ABNORMAL LOW (ref 6.5–8.1)

## 2015-10-28 LAB — CBG MONITORING, ED
GLUCOSE-CAPILLARY: 100 mg/dL — AB (ref 65–99)
GLUCOSE-CAPILLARY: 32 mg/dL — AB (ref 65–99)
Glucose-Capillary: 60 mg/dL — ABNORMAL LOW (ref 65–99)

## 2015-10-28 LAB — I-STAT CG4 LACTIC ACID, ED: LACTIC ACID, VENOUS: 1.8 mmol/L (ref 0.5–1.9)

## 2015-10-28 MED ORDER — POTASSIUM CHLORIDE CRYS ER 20 MEQ PO TBCR
40.0000 meq | EXTENDED_RELEASE_TABLET | Freq: Once | ORAL | Status: AC
  Administered 2015-10-28: 40 meq via ORAL
  Filled 2015-10-28: qty 2

## 2015-10-28 MED ORDER — DEXTROSE 50 % IV SOLN
1.0000 | Freq: Once | INTRAVENOUS | Status: AC
  Administered 2015-10-28: 50 mL via INTRAVENOUS
  Filled 2015-10-28: qty 50

## 2015-10-28 MED ORDER — SODIUM CHLORIDE 0.9 % IV BOLUS (SEPSIS)
1000.0000 mL | Freq: Once | INTRAVENOUS | Status: AC
  Administered 2015-10-28: 1000 mL via INTRAVENOUS

## 2015-10-28 MED ORDER — POTASSIUM CHLORIDE 10 MEQ/100ML IV SOLN
10.0000 meq | INTRAVENOUS | Status: AC
  Administered 2015-10-28 – 2015-10-29 (×3): 10 meq via INTRAVENOUS
  Filled 2015-10-28 (×3): qty 100

## 2015-10-28 MED ORDER — POTASSIUM CHLORIDE 2 MEQ/ML IV SOLN
INTRAVENOUS | Status: DC
  Administered 2015-10-28: 23:00:00 via INTRAVENOUS
  Filled 2015-10-28 (×2): qty 1000

## 2015-10-28 NOTE — ED Notes (Signed)
CBG 32. MD [place] notified.

## 2015-10-28 NOTE — ED Notes (Signed)
Potassium >7.5 and hemolyzed er md notified

## 2015-10-28 NOTE — ED Notes (Signed)
EKG obtained 1859

## 2015-10-28 NOTE — ED Notes (Signed)
CBG assessed and found to be 60.

## 2015-10-28 NOTE — ED Provider Notes (Signed)
[facility] DEPT Provider Note   CSN: [id number] Arrival date & time: 10/28/15  1906     History   Chief Complaint Chief Complaint  Patient presents with  . Altered Mental Status  . Hypoglycemia    HPI Carmen [name] is a 82 y.o. female.  HPI Patient's daughter came home from work this afternoon found her mom altered and unresponsive and EMS was contacted.  Blood sugar was found to be 31.  She was given an amp of D50 and her blood sugar came up 200.  On arrival to the ER as her blood sugars back down to 60.  She states that she took all of her medications this morning.  Family reports that she was given her evening medications this morning instead of her morning medications.  They do not know what these medications are.  This is all based on the pill bottle itself.  Patient has a history of renal insufficiency as well as diabetes.  She's had a prior stroke as well.  She also reports that she is on insulin and the daughter reports that she was given both breakfast and lunch today and ate normally.   Past Medical History:  Diagnosis Date  . Anxiety   . Arthritis    "everywhere"  . Cataract    "both eyes; blood pressure's always too high to have them fixed" (09/11/2014)  . Chronic lower back pain   . Claudication in peripheral vascular disease ([place])    02/16/10: Left CIA 9.0x28 Omnilink and REIA 8.0x40 seff expanding Zilver.   right subclavian artery stent 03/18/2008,  . Coronary artery disease   . Herniated lumbar intervertebral disc   . Hypertension   . IDDM (insulin dependent diabetes mellitus) ([place])   . Migraine    "used to have terrible migraines; stopped in the 1990's"  . Pacemaker   . Peripheral vascular disease ([place])   . Pneumonia X 2  . Renal disorder   . Renovascular hypertension     s/p left renal artery stent 12/2007.  S/P balloon angioplasty on 02/16/10 for ISR, BP was  controlled well since then.     . Stroke [facility]) 1999   Stroke and TIA in 1999 with right  sided weakness, now with residual right arm weakness (09/11/2014)  . UTI (lower urinary tract infection) 02/2015    Patient Active Problem List   Diagnosis Date Noted  . DNR (do not resuscitate) discussion 02/27/2015  . Palliative care encounter 02/27/2015  . Acute on chronic kidney failure ([place])   . Constipation   . Dehydration, moderate 02/26/2015  . Failure to thrive in adult 02/26/2015  . Acute on chronic renal failure ([place]) 02/26/2015  . Acute cystitis without hematuria   . AKI (acute kidney injury) ([place]) 01/14/2015  . Metabolic acidosis [id number]  . Urinary retention 11/02/2014  . Lower limb ischemia; LLE 11/02/2014  . Hyperkalemia 11/02/2014  . Acute renal failure syndrome ([place])   . Hematuria   . Acute encephalopathy   . Altered mental status 10/28/2014  . Confusion 10/28/2014  . Pulmonary edema 09/11/2014  . Acute respiratory failure with hypoxia ([place]) 09/11/2014  . Acute diastolic heart failure ([place]) 09/11/2014  . Protein-calorie malnutrition, severe ([place]) 08/06/2013  . Hypertensive crisis 08/05/2013  . Hypertensive urgency 08/03/2013  . Weakness generalized 04/06/2013  . Dehydration 04/06/2013  . Transaminitis 04/06/2013  . Type 2 diabetes mellitus with hyperosmolar nonketotic hyperglycemia ([place]) 04/05/2013  . CKD (chronic kidney disease), stage III 04/05/2013  . HTN (hypertension)  04/05/2013  . Dyslipidemia 04/05/2013  . Abnormal LFTs 04/05/2013  . CVA (cerebral infarction) 10/15/2011  . [employer] 10/11/2011  . History of CVA (cerebrovascular accident) with associated mild right upper extremity hemiplegia 07/30/2011  . Retrovascular hypertension status post left renal artery stent 2009 and balloon angioplasty for in-stent restenosis 2011 04/05/2011  . Hypokalemia 03/28/2011  . Tobacco abuse 03/28/2011  . DM (diabetes mellitus), type 2, uncontrolled, periph vascular complic ([place]) [id number]  . Peripheral neuropathy ([place]) 03/28/2011    Past  Surgical History:  Procedure Laterality Date  . ABDOMINAL ANGIOGRAM N/A 07/06/2011   Procedure: ABDOMINAL ANGIOGRAM;  Surgeon: Laverda Page, MD;  Location: [facility] CATH LAB;  Service: Cardiovascular;  Laterality: N/A;  . APPENDECTOMY    . BACK SURGERY    . CAROTID ENDARTERECTOMY Left 1991    Stroke and TIA in 1999 with right sided weakness, now with residual right arm weakness  . CARPAL TUNNEL RELEASE Left   . COLONOSCOPY  07/30/2011   Procedure: COLONOSCOPY;  Surgeon: Inda Castle, MD;  Location: [facility];  Service: Endoscopy;  Laterality: N/A;  gi bleed  . ESOPHAGOGASTRODUODENOSCOPY  07/30/2011   Procedure: ESOPHAGOGASTRODUODENOSCOPY (EGD);  Surgeon: Inda Castle, MD;  Location: [facility];  Service: Endoscopy;  Laterality: N/A;  . EXCISIONAL HEMORRHOIDECTOMY    . INSERT / REPLACE / REMOVE PACEMAKER    . LOWER EXTREMITY ANGIOGRAM Right 05/29/2012   Procedure: LOWER EXTREMITY ANGIOGRAM;  Surgeon: Laverda Page, MD;  Location: [facility] CATH LAB;  Service: Cardiovascular;  Laterality: Right;  . LUMBAR LAMINECTOMY     'herniated disc"  . PERIPHERAL VASCULAR CATHETERIZATION N/A 11/05/2014   Procedure: Abdominal Aortogram;  Surgeon: Serafina Mitchell, MD;  Location: [facility] CV LAB;  Service: Cardiovascular;  Laterality: N/A;  . PERMANENT PACEMAKER INSERTION Left 06/28/2011   Procedure: PERMANENT PACEMAKER INSERTION;  Surgeon: Deboraha Sprang, MD;  Location: [facility] CATH LAB;  Service: Cardiovascular;  Laterality: Left;  . RENAL ANGIOGRAM N/A 07/06/2011   Procedure: RENAL ANGIOGRAM;  Surgeon: Laverda Page, MD;  Location: [facility] CATH LAB;  Service: Cardiovascular;  Laterality: N/A;  . TONSILLECTOMY    . URETERAL STENT PLACEMENT    . VAGINAL HYSTERECTOMY      OB History    No data available       Home Medications    Prior to Admission medications   Medication Sig Start Date End Date Taking? Authorizing Provider  aspirin EC 81 MG tablet Take 81 mg by mouth daily.    Historical  Provider, MD  clopidogrel (PLAVIX) 75 MG tablet Take 75 mg by mouth daily.    Historical Provider, MD  feeding supplement, GLUCERNA SHAKE, (GLUCERNA SHAKE) LIQD Take 237 mLs by mouth 2 (two) times daily between meals. 01/16/15   Belkys A Regalado, MD  guaiFENesin (MUCINEX) 600 MG 12 hr tablet Take 1 tablet (600 mg total) by mouth 2 (two) times daily as needed. 02/15/15   Dorie Rank, MD  hydrALAZINE (APRESOLINE) 100 MG tablet Take 1 tablet (100 mg total) by mouth 4 (four) times daily. 01/16/15   Belkys A Regalado, MD  isosorbide mononitrate (IMDUR) 120 MG 24 hr tablet Take 1 tablet (120 mg total) by mouth daily. 12/12/12   Annita Brod, MD  linagliptin (TRADJENTA) 5 MG TABS tablet Take 1 tablet (5 mg total) by mouth daily. 12/01/14   Lauree Chandler, NP  metoprolol tartrate (LOPRESSOR) 25 MG tablet Take 25 mg by mouth 2 (two) times daily.  Historical Provider, MD  Omega-3 Fatty Acids (FISH OIL) 1000 MG CAPS Take 1,000 mg by mouth 2 (two) times daily.     Historical Provider, MD  pantoprazole (PROTONIX) 40 MG tablet Take 1 tablet (40 mg total) by mouth daily. 01/16/15   Belkys A Regalado, MD  polyethylene glycol (MIRALAX / GLYCOLAX) packet Take 17 g by mouth daily. 03/01/15   Lavina Hamman, MD    Family History Family History  Problem Relation Age of Onset  . Cancer Father   . Heart attack Mother     Social History Social History  Substance Use Topics  . Smoking status: Current Every Day Smoker    Packs/day: 0.00    Years: 70.00    Types: Cigarettes  . Smokeless tobacco: Never Used     Comment: 09/11/2014 "I used to smoke very heavy; down to 1 cigarette/day"  . Alcohol use No     Comment: "stopped drinking back in the 1970's"     Allergies   Norvasc [amlodipine besylate] and Peanut-containing drug products   Review of Systems Review of Systems  All other systems reviewed and are negative.    Physical Exam Updated Vital Signs BP 112/62   Pulse 78   Temp 97.6 F (36.4  C) (Oral)   Resp 14   Ht 5\' 7"  (1.702 m)   Wt 130 lb (59 kg) Comment: per family  SpO2 100%   BMI 20.36 kg/m   Physical Exam  Constitutional: She is oriented to person, place, and time. She appears well-developed and well-nourished. No distress.  HENT:  Head: Normocephalic and atraumatic.  Eyes: EOM are normal.  Neck: Normal range of motion.  Cardiovascular: Normal rate, regular rhythm and normal heart sounds.   Pulmonary/Chest: Effort normal and breath sounds normal.  Abdominal: Soft. She exhibits no distension. There is no tenderness.  Musculoskeletal: Normal range of motion.  Neurological: She is alert and oriented to person, place, and time.  Skin: Skin is warm and dry.  Psychiatric: She has a normal mood and affect. Judgment normal.  Nursing note and vitals reviewed.    ED Treatments / Results  Labs (all labs ordered are listed, but only abnormal results are displayed) Labs Reviewed  COMPREHENSIVE METABOLIC PANEL - Abnormal; Notable for the following:       Result Value   Potassium <2.0 (*)    Glucose, Bld 64 (*)    BUN 21 (*)    Creatinine, Ser 1.67 (*)    Calcium 7.4 (*)    Total Protein 5.3 (*)    Albumin 2.5 (*)    GFR calc non Af Amer 28 (*)    GFR calc Af Amer 33 (*)    All other components within normal limits  CBG MONITORING, ED - Abnormal; Notable for the following:    Glucose-Capillary 60 (*)    All other components within normal limits  CBG MONITORING, ED - Abnormal; Notable for the following:    Glucose-Capillary 100 (*)    All other components within normal limits  CBC  URINALYSIS, ROUTINE W REFLEX MICROSCOPIC (NOT AT [facility])  Randolm Idol, ED  I-STAT CG4 LACTIC ACID, ED  CBG MONITORING, ED    EKG  EKG Interpretation  Date/Time:  Tuesday October 28 2015 18:59:58 EDT Ventricular Rate:  122 PR Interval:    QRS Duration: 102 QT Interval:  412 QTC Calculation: 587 R Axis:   12 Text Interpretation:  Demand pacemaker; interpretation is  based on intrinsic rhythm Atrial fibrillation  with rapid ventricular response with premature ventricular or aberrantly conducted complexes Marked ST abnormality, possible inferior subendocardial injury Marked ST abnormality, possible anterior subendocardial injury Abnormal ECG changed from prior ecg Confirmed by Harriett Azar  MD, Vondra Aldredge ([id number]) on 10/28/2015 8:34:02 PM       Radiology Dg Chest 2 View  Result Date: 10/28/2015 CLINICAL DATA:  Weakness and hypoglycemia EXAM: CHEST  2 VIEW COMPARISON:  Chest radiograph 02/26/2015 FINDINGS: Aortic atherosclerotic calcification. Vascular stent overlying the right lung apex. Left chest wall pacemaker. There is shallow lung inflation. Cardiomediastinal silhouette remains mildly enlarged. No pulmonary edema. Trace left pleural effusion. No pneumothorax. Linear opacity at the lung bases, worse on the right, likely atelectasis. No focal airspace consolidation. Advanced osteoarthrosis of the right shoulder. IMPRESSION: 1. Mild cardiomegaly and aortic atherosclerosis. 2. Bibasilar atelectasis without focal consolidation. 3. Severe right shoulder osteoarthrosis. Electronically Signed   By: Ulyses Jarred M.D.   On: 10/28/2015 21:55   Ct Head Wo Contrast  Result Date: 10/28/2015 CLINICAL DATA:  Right-sided weakness.  Hypoglycemia. EXAM: CT HEAD WITHOUT CONTRAST TECHNIQUE: Contiguous axial images were obtained from the base of the skull through the vertex without intravenous contrast. COMPARISON:  Head CT 02/15/2015 FINDINGS: Brain: There is no mass lesion, intraparenchymal hemorrhage or extra-axial collection. No evidence of acute cortical infarct. No hydrocephalus. There is confluent periventricular white matter hypoattenuation compatible with chronic microvascular disease. There are multiple old lacunar infarcts, including within the left pericapsular region and right pons. Ventricles and sulci are normal for patient age. Vascular: There is atherosclerotic calcification  within the posterior circulation and within the proximal intracranial internal carotid arteries. Skull: Normal Sinuses/Orbits: There is a trace amount of fluid within the sphenoid sinus. Normal orbits. Other: Visualized extracranial soft tissues are normal. IMPRESSION: 1. No acute intracranial abnormality. 2. Advanced chronic microvascular disease and multiple old lacunar infarcts. Electronically Signed   By: Ulyses Jarred M.D.   On: 10/28/2015 22:06    Procedures Procedures (including critical care time)  Medications Ordered in ED Medications  sodium chloride 0.9 % bolus 1,000 mL (not administered)  potassium chloride 10 mEq in 100 mL IVPB (not administered)  potassium chloride SA (K-DUR,KLOR-CON) CR tablet 40 mEq (not administered)  dextrose 50 % solution 50 mL (50 mLs Intravenous Given 10/28/15 1915)     Initial Impression / Assessment and Plan / ED Course  I have reviewed the triage vital signs and the nursing notes.  Pertinent labs & imaging results that were available during my care of the patient were reviewed by me and considered in my medical decision making (see chart for details).  Clinical Course    Patient be admitted for severe hypokalemia with scooping ST segments of developing UAs.  IVP potassium now.  She did require one repeat dose of dextrose here in emergency department.  We will attempt to feed the patient some protein and carbs.  Blood sugar is being rechecked at this time.  She'll need admission the hospital.  Head CT negative.  Overall well-appearing  Final Clinical Impressions(s) / ED Diagnoses   Final diagnoses:  Hypoglycemia  Hypokalemia    New Prescriptions New Prescriptions   No medications on file     Jola Schmidt, MD 10/28/15 2223

## 2015-10-28 NOTE — ED Triage Notes (Addendum)
Per daughter her eyes were rolling back in her head. Like losing consciousness. Pt states she ate 2 fish sticks (loaded potatoes.) Pt alert and oriented to self, month, and place. Pt remembers only that the baby was waking her up. Per daughter LSN was between 0700-0730.  Daughter states her grand daughter states she gave them lunch and she was fine and everything was fine and she went to sleep.  She states she put her hand on her chest and saw her stomach moving really fast and felt like her heart was going to beat out of her chest.

## 2015-10-28 NOTE — ED Triage Notes (Signed)
C/O of altered mental status and a history of CVA with R sided weakness. Pt. CBG was 31 on scene, 25g D50 given in truck which brought it up to 117. CBG on arrival was 60. Pt. Appears lethargic and has noted Right sided weakness. BP is 140/74 on the truck.  Last time seen normal was 8AM when daughter left for work

## 2015-10-29 ENCOUNTER — Encounter (HOSPITAL_COMMUNITY): Payer: Self-pay | Admitting: Internal Medicine

## 2015-10-29 DIAGNOSIS — E1151 Type 2 diabetes mellitus with diabetic peripheral angiopathy without gangrene: Secondary | ICD-10-CM

## 2015-10-29 DIAGNOSIS — E876 Hypokalemia: Secondary | ICD-10-CM

## 2015-10-29 DIAGNOSIS — E1165 Type 2 diabetes mellitus with hyperglycemia: Secondary | ICD-10-CM

## 2015-10-29 DIAGNOSIS — E162 Hypoglycemia, unspecified: Secondary | ICD-10-CM | POA: Diagnosis not present

## 2015-10-29 DIAGNOSIS — N183 Chronic kidney disease, stage 3 (moderate): Secondary | ICD-10-CM

## 2015-10-29 DIAGNOSIS — I10 Essential (primary) hypertension: Secondary | ICD-10-CM

## 2015-10-29 LAB — URINE MICROSCOPIC-ADD ON

## 2015-10-29 LAB — GLUCOSE, CAPILLARY
GLUCOSE-CAPILLARY: 266 mg/dL — AB (ref 65–99)
GLUCOSE-CAPILLARY: 314 mg/dL — AB (ref 65–99)
Glucose-Capillary: 307 mg/dL — ABNORMAL HIGH (ref 65–99)
Glucose-Capillary: 278 mg/dL — ABNORMAL HIGH (ref 65–99)

## 2015-10-29 LAB — CBC
HCT: 36.7 % (ref 36.0–46.0)
Hemoglobin: 12.3 g/dL (ref 12.0–15.0)
MCH: 31.6 pg (ref 26.0–34.0)
MCHC: 33.5 g/dL (ref 30.0–36.0)
MCV: 94.3 fL (ref 78.0–100.0)
PLATELETS: 147 10*3/uL — AB (ref 150–400)
RBC: 3.89 MIL/uL (ref 3.87–5.11)
RDW: 14 % (ref 11.5–15.5)
WBC: 4.6 10*3/uL (ref 4.0–10.5)

## 2015-10-29 LAB — MAGNESIUM: MAGNESIUM: 1.5 mg/dL — AB (ref 1.7–2.4)

## 2015-10-29 LAB — BASIC METABOLIC PANEL
Anion gap: 9 (ref 5–15)
BUN: 19 mg/dL (ref 6–20)
CALCIUM: 6.6 mg/dL — AB (ref 8.9–10.3)
CO2: 22 mmol/L (ref 22–32)
CREATININE: 1.78 mg/dL — AB (ref 0.44–1.00)
Chloride: 105 mmol/L (ref 101–111)
GFR calc non Af Amer: 26 mL/min — ABNORMAL LOW (ref >60)
GFR, EST AFRICAN AMERICAN: 30 mL/min — AB (ref >60)
Glucose, Bld: 297 mg/dL — ABNORMAL HIGH (ref 65–99)
Potassium: 2.3 mmol/L — CL (ref 3.5–5.1)
SODIUM: 136 mmol/L (ref 135–145)

## 2015-10-29 LAB — URINALYSIS, ROUTINE W REFLEX MICROSCOPIC
BILIRUBIN URINE: NEGATIVE
Glucose, UA: 100 mg/dL — AB
Hgb urine dipstick: NEGATIVE
Ketones, ur: NEGATIVE mg/dL
Leukocytes, UA: NEGATIVE
NITRITE: NEGATIVE
SPECIFIC GRAVITY, URINE: 1.015 (ref 1.005–1.030)
pH: 5.5 (ref 5.0–8.0)

## 2015-10-29 LAB — CBG MONITORING, ED
GLUCOSE-CAPILLARY: 276 mg/dL — AB (ref 65–99)
Glucose-Capillary: 295 mg/dL — ABNORMAL HIGH (ref 65–99)

## 2015-10-29 LAB — POTASSIUM: POTASSIUM: 3 mmol/L — AB (ref 3.5–5.1)

## 2015-10-29 LAB — MRSA PCR SCREENING: MRSA BY PCR: NEGATIVE

## 2015-10-29 MED ORDER — ISOSORBIDE MONONITRATE ER 60 MG PO TB24
120.0000 mg | ORAL_TABLET | Freq: Every day | ORAL | Status: DC
  Administered 2015-10-29 – 2015-10-30 (×2): 120 mg via ORAL
  Filled 2015-10-29 (×3): qty 2

## 2015-10-29 MED ORDER — ACETAMINOPHEN 325 MG PO TABS: 650.0000 mg | ORAL_TABLET | Freq: Four times a day (QID) | ORAL | Status: DC | PRN

## 2015-10-29 MED ORDER — POTASSIUM CHLORIDE CRYS ER 20 MEQ PO TBCR
40.0000 meq | EXTENDED_RELEASE_TABLET | Freq: Once | ORAL | Status: AC
  Administered 2015-10-29: 40 meq via ORAL
  Filled 2015-10-29: qty 2

## 2015-10-29 MED ORDER — ONDANSETRON HCL 4 MG/2ML IJ SOLN: 4.0000 mg | Freq: Four times a day (QID) | INTRAMUSCULAR | Status: DC | PRN

## 2015-10-29 MED ORDER — METOPROLOL TARTRATE 25 MG PO TABS
25.0000 mg | ORAL_TABLET | Freq: Two times a day (BID) | ORAL | Status: DC
  Administered 2015-10-29 – 2015-10-30 (×4): 25 mg via ORAL
  Filled 2015-10-29 (×4): qty 1

## 2015-10-29 MED ORDER — POTASSIUM CHLORIDE CRYS ER 20 MEQ PO TBCR
40.0000 meq | EXTENDED_RELEASE_TABLET | ORAL | Status: AC
  Administered 2015-10-29 (×2): 40 meq via ORAL
  Filled 2015-10-29 (×2): qty 2

## 2015-10-29 MED ORDER — GLUCERNA SHAKE PO LIQD
237.0000 mL | Freq: Two times a day (BID) | ORAL | Status: DC
  Administered 2015-10-29 – 2015-10-30 (×2): 237 mL via ORAL
  Filled 2015-10-29: qty 237

## 2015-10-29 MED ORDER — ACETAMINOPHEN 650 MG RE SUPP: 650.0000 mg | Freq: Four times a day (QID) | RECTAL | Status: DC | PRN

## 2015-10-29 MED ORDER — HYDRALAZINE HCL 50 MG PO TABS
100.0000 mg | ORAL_TABLET | Freq: Three times a day (TID) | ORAL | Status: DC
  Administered 2015-10-29 – 2015-10-30 (×4): 100 mg via ORAL
  Filled 2015-10-29 (×5): qty 2

## 2015-10-29 MED ORDER — MAGNESIUM SULFATE 4 GM/100ML IV SOLN
4.0000 g | Freq: Once | INTRAVENOUS | Status: AC
  Administered 2015-10-29: 4 g via INTRAVENOUS
  Filled 2015-10-29: qty 100

## 2015-10-29 MED ORDER — PANTOPRAZOLE SODIUM 40 MG PO TBEC
40.0000 mg | DELAYED_RELEASE_TABLET | Freq: Every day | ORAL | Status: DC
  Administered 2015-10-29: 40 mg via ORAL
  Filled 2015-10-29: qty 1

## 2015-10-29 MED ORDER — INSULIN ASPART 100 UNIT/ML ~~LOC~~ SOLN
0.0000 [IU] | Freq: Three times a day (TID) | SUBCUTANEOUS | Status: DC
  Administered 2015-10-29: 3 [IU] via SUBCUTANEOUS
  Administered 2015-10-30: 1 [IU] via SUBCUTANEOUS
  Administered 2015-10-30: 9 [IU] via SUBCUTANEOUS

## 2015-10-29 MED ORDER — ONDANSETRON HCL 4 MG PO TABS: 4.0000 mg | ORAL_TABLET | Freq: Four times a day (QID) | ORAL | Status: DC | PRN

## 2015-10-29 MED ORDER — ASPIRIN EC 81 MG PO TBEC
81.0000 mg | DELAYED_RELEASE_TABLET | Freq: Every day | ORAL | Status: DC
  Administered 2015-10-29 – 2015-10-30 (×2): 81 mg via ORAL
  Filled 2015-10-29 (×2): qty 1

## 2015-10-29 MED ORDER — OMEGA-3-ACID ETHYL ESTERS 1 G PO CAPS
1000.0000 mg | ORAL_CAPSULE | Freq: Two times a day (BID) | ORAL | Status: DC
  Administered 2015-10-29 – 2015-10-30 (×3): 1000 mg via ORAL
  Filled 2015-10-29 (×4): qty 1

## 2015-10-29 MED ORDER — CLOPIDOGREL BISULFATE 75 MG PO TABS
75.0000 mg | ORAL_TABLET | Freq: Every day | ORAL | Status: DC
  Administered 2015-10-29 – 2015-10-30 (×2): 75 mg via ORAL
  Filled 2015-10-29 (×2): qty 1

## 2015-10-29 MED ORDER — ENOXAPARIN SODIUM 40 MG/0.4ML ~~LOC~~ SOLN
40.0000 mg | Freq: Every day | SUBCUTANEOUS | Status: DC
  Administered 2015-10-29 – 2015-10-30 (×2): 40 mg via SUBCUTANEOUS
  Filled 2015-10-29 (×2): qty 0.4

## 2015-10-29 MED ORDER — GUAIFENESIN ER 600 MG PO TB12: 600.0000 mg | ORAL_TABLET | Freq: Two times a day (BID) | ORAL | Status: DC | PRN

## 2015-10-29 NOTE — ED Notes (Signed)
Reuel Derby [phone] Pt daughter

## 2015-10-29 NOTE — ED Notes (Signed)
Paged Dr. Raliegh Ip about critical potassium

## 2015-10-29 NOTE — Care Management Note (Signed)
Case Management Note  Patient Details  Name: MEENA MAFI MRN: [id number] Date of Birth: 19-Aug-1933  Subjective/Objective:   Patient lives at home with spouse and her son.  She has a rolling walker, a cane and a bsc.  Patient has insurance for her medications, she has a pcp and she has transportation at [employer]. May need pt eval.   NCM will cont to follow for dc needs.                 Action/Plan:   Expected Discharge Date:                  Expected Discharge Plan:  [place]  In-House Referral:     Discharge planning Services  CM Consult  Post Acute Care Choice:    Choice offered to:     DME Arranged:    DME Agency:     HH Arranged:    [place] Agency:     Status of Service:  In process, will continue to follow  If discussed at Long Length of Stay Meetings, dates discussed:    Additional Comments:  Zenon Mayo, RN 10/29/2015, 2:39 PM

## 2015-10-29 NOTE — ED Notes (Signed)
Attempted report 

## 2015-10-29 NOTE — Care Management Obs Status (Signed)
[place] NOTIFICATION   Patient Details  Name: Carmen [name] MRN: [id number] Date of Birth: Dec 05, 1933   Medicare Observation Status Notification Given:  Yes    Zenon Mayo, RN 10/29/2015, 2:50 PM

## 2015-10-29 NOTE — Progress Notes (Addendum)
PROGRESS NOTE  Carmen [name]  [id number] DOB: [birth year] DOA: 10/28/2015 PCP: Maximino Greenland, MD  Brief Narrative:    Carmen [name] is a 82 y.o. female with diabetes mellitus type 2, chronic kidney disease stage 3-4, diastolic CHF, sick sinus syndrome status post pacemaker placement was brought to the ER after patient's daughter found patient unresponsive the night of admission. Patient's blood sugar was found to be around 30 and was given D50 ampule. Patient was brought to the ER and again blood sugar was found to be in the 60s. Patient's potassium level was less than 2. She was admitted for hypoglycemia and severe hypokalemia. She denied recent medication changes or infections.     Assessment & Plan:   Principal Problem:   Hypoglycemia Active Problems:   DM (diabetes mellitus), type 2, uncontrolled, periph vascular complic (HCC)   CKD (chronic kidney disease), stage III   HTN (hypertension)   Hyperkalemia  Hypoglycemia, unclear etiology.  Her family members administer her medication and she has not had any medication changes.  She denies recent fevers or signs of infection. She states she was eating normally prior to admission. -  Continue to hold linagliptin -  Continue every hour fingersticks -  DC D10 -  Will not start insulin administration and tell CBGs are stable for at least 4 hours off dextrose infusion, likely this evening  Severe hypokalemia, not on diuretics. She has not had weight loss to suggest poor oral intake. May be related to her Protonix. -  Has received 110 mEq KCl since admission -  Magnesium level I.5, administer 4 g IV magnesium -  Repeat potassium level around lunchtime and continue supplementation as needed -  Consider cessation of Protonix  Chronic kidney disease stage IV, creatinine at baseline near 1.8 -  Minimize nephrotoxins, renally dose medications  History of stroke and peripheral vascular disease, stable on aspirin,  Plavix  Hypertension, blood pressure stable on hydralazine, Imdur, metoprolol  Telemetry demonstrates paced rhythm  DVT prophylaxis:  Lovenox Code Status:  DO NOT RESUSCITATE Family Communication:  Patient alone Disposition Plan:  Possibly home tomorrow if CBG stable, potassium within normal limits   Consultants:   None  Procedures:  None  Antimicrobials:   None    Subjective: States that she is feeling much better. She does not have good memory of the events that led to her admission. She denies recent fevers, chills, sinus congestion, sore throat, difficulty breathing, cough, vomiting, diarrhea, or dysuria. She does not recall any recent medication changes. Her family members administer her medications for her, including her 8 units of insulin that she receives at breakfast and dinner.  Objective: Vitals:   10/29/15 0515 10/29/15 0545 10/29/15 0726 10/29/15 1100  BP: 112/74 127/57 106/65 (!) 142/68  Pulse:   76 72  Resp: 13 11 18  (!) 24  Temp:   97.7 F (36.5 C)   TempSrc:   Oral   SpO2:   100% 100%  Weight:      Height:        Intake/Output Summary (Last 24 hours) at 10/29/15 1322 Last data filed at 10/29/15 1100  Gross per 24 hour  Intake           593.33 ml  Output              250 ml  Net           343.33 ml   Filed Weights   10/28/15 1930  Weight: 59  kg (130 lb)    Examination:  General exam:  Adult Female.  No acute distress, lying comfortably in bed HEENT:  NCAT, MMM Respiratory system: Clear to auscultation bilaterally Cardiovascular system: Regular rate and rhythm, normal S1/S2. No murmurs, rubs, gallops or clicks.  Warm extremities Gastrointestinal system: Normal active bowel sounds, soft, nondistended, nontender. MSK:  Normal tone and bulk, no lower extremity edema Neuro:  Grossly intact Psych:  Normal affect. Fair insight    Data Reviewed: I have personally reviewed following labs and imaging studies  CBC:  Recent Labs Lab  10/28/15 2000 10/29/15 0650  WBC 6.9 4.6  HGB 13.2 12.3  HCT 40.0 36.7  MCV 95.0 94.3  PLT 165 [id number]*   Basic Metabolic Panel:  Recent Labs Lab 10/28/15 2056 10/29/15 0340 10/29/15 0912  NA 137 136  --   K <2.0* 2.3*  --   CL 101 105  --   CO2 28 22  --   GLUCOSE 64* 297*  --   BUN 21* 19  --   CREATININE 1.67* 1.78*  --   CALCIUM 7.4* 6.6*  --   MG  --   --  1.5*   GFR: Estimated Creatinine Clearance: 23.9 mL/min (by C-G formula based on SCr of 1.78 mg/dL). Liver Function Tests:  Recent Labs Lab 10/28/15 2056  AST 40  ALT 30  ALKPHOS 71  BILITOT 0.6  PROT 5.3*  ALBUMIN 2.5*   No results for input(s): LIPASE, AMYLASE in the last 168 hours. No results for input(s): AMMONIA in the last 168 hours. Coagulation Profile: No results for input(s): INR, PROTIME in the last 168 hours. Cardiac Enzymes: No results for input(s): CKTOTAL, CKMB, CKMBINDEX, TROPONINI in the last 168 hours. BNP (last 3 results) No results for input(s): PROBNP in the last 8760 hours. HbA1C: No results for input(s): HGBA1C in the last 72 hours. CBG:  Recent Labs Lab 10/28/15 2225 10/29/15 0305 10/29/15 0519 10/29/15 0950 10/29/15 1225  GLUCAP 32* 276* 295* 266* 307*   Lipid Profile: No results for input(s): CHOL, HDL, LDLCALC, TRIG, CHOLHDL, LDLDIRECT in the last 72 hours. Thyroid Function Tests: No results for input(s): TSH, T4TOTAL, FREET4, T3FREE, THYROIDAB in the last 72 hours. Anemia Panel: No results for input(s): VITAMINB12, FOLATE, FERRITIN, TIBC, IRON, RETICCTPCT in the last 72 hours. Urine analysis:    Component Value Date/Time   COLORURINE YELLOW 10/29/2015 0006   APPEARANCEUR CLOUDY (A) 10/29/2015 0006   LABSPEC 1.015 10/29/2015 0006   PHURINE 5.5 10/29/2015 0006   GLUCOSEU 100 (A) 10/29/2015 0006   HGBUR NEGATIVE 10/29/2015 0006   BILIRUBINUR NEGATIVE 10/29/2015 0006   KETONESUR NEGATIVE 10/29/2015 0006   PROTEINUR >300 (A) 10/29/2015 0006   UROBILINOGEN 0.2  01/13/2015 2034   NITRITE NEGATIVE 10/29/2015 0006   LEUKOCYTESUR NEGATIVE 10/29/2015 0006   Sepsis Labs: @LABRCNTIP (procalcitonin:4,lacticidven:4)  ) Recent Results (from the past 240 hour(s))  MRSA PCR Screening     Status: None   Collection Time: 10/29/15  6:52 AM  Result Value Ref Range Status   MRSA by PCR NEGATIVE NEGATIVE Final    Comment:        The GeneXpert MRSA Assay (FDA approved for NASAL specimens only), is one component of a comprehensive MRSA colonization surveillance program. It is not intended to diagnose MRSA infection nor to guide or monitor treatment for MRSA infections.       Radiology Studies: Dg Chest 2 View  Result Date: 10/28/2015 CLINICAL DATA:  Weakness and hypoglycemia EXAM: CHEST  2 VIEW COMPARISON:  Chest radiograph 02/26/2015 FINDINGS: Aortic atherosclerotic calcification. Vascular stent overlying the right lung apex. Left chest wall pacemaker. There is shallow lung inflation. Cardiomediastinal silhouette remains mildly enlarged. No pulmonary edema. Trace left pleural effusion. No pneumothorax. Linear opacity at the lung bases, worse on the right, likely atelectasis. No focal airspace consolidation. Advanced osteoarthrosis of the right shoulder. IMPRESSION: 1. Mild cardiomegaly and aortic atherosclerosis. 2. Bibasilar atelectasis without focal consolidation. 3. Severe right shoulder osteoarthrosis. Electronically Signed   By: Ulyses Jarred M.D.   On: 10/28/2015 21:55   Ct Head Wo Contrast  Result Date: 10/28/2015 CLINICAL DATA:  Right-sided weakness.  Hypoglycemia. EXAM: CT HEAD WITHOUT CONTRAST TECHNIQUE: Contiguous axial images were obtained from the base of the skull through the vertex without intravenous contrast. COMPARISON:  Head CT 02/15/2015 FINDINGS: Brain: There is no mass lesion, intraparenchymal hemorrhage or extra-axial collection. No evidence of acute cortical infarct. No hydrocephalus. There is confluent periventricular white matter  hypoattenuation compatible with chronic microvascular disease. There are multiple old lacunar infarcts, including within the left pericapsular region and right pons. Ventricles and sulci are normal for patient age. Vascular: There is atherosclerotic calcification within the posterior circulation and within the proximal intracranial internal carotid arteries. Skull: Normal Sinuses/Orbits: There is a trace amount of fluid within the sphenoid sinus. Normal orbits. Other: Visualized extracranial soft tissues are normal. IMPRESSION: 1. No acute intracranial abnormality. 2. Advanced chronic microvascular disease and multiple old lacunar infarcts. Electronically Signed   By: Ulyses Jarred M.D.   On: 10/28/2015 22:06     Scheduled Meds: . aspirin EC  81 mg Oral Daily  . clopidogrel  75 mg Oral Daily  . enoxaparin (LOVENOX) injection  40 mg Subcutaneous Daily  . feeding supplement (GLUCERNA SHAKE)  237 mL Oral BID BM  . hydrALAZINE  100 mg Oral TID AC & HS  . isosorbide mononitrate  120 mg Oral Daily  . magnesium sulfate 1 - 4 g bolus IVPB  4 g Intravenous Once  . metoprolol tartrate  25 mg Oral BID  . omega-3 acid ethyl esters  1,000 mg Oral BID  . pantoprazole  40 mg Oral Daily   Continuous Infusions:    LOS: 0 days    Time spent: 30 min    Janece Canterbury, MD [facility] Pager [phone]  If 7PM-7AM, please contact night-coverage [email or web address] Password [email or web address] 10/29/2015, 1:22 PM

## 2015-10-29 NOTE — H&P (Signed)
History and Physical    Carmen [name] [id number] DOB: [birth year] DOA: 10/28/2015  PCP: Maximino Greenland, MD  Patient coming from: Home.  Chief Complaint: Unresponsive episode.  HPI: Carmen [name] is a 82 y.o. female with diabetes mellitus type 2, chronic kidney disease stage 3-4, diastolic CHF, sick sinus syndrome status post pacemaker placement was brought to the ER after patient's daughter found patient unresponsive last evening. Patient's blood sugar was found to be around 30 and was given D50 ampule. Patient was brought to the ER and again blood sugar was found to be in the 60s. Patient's potassium level is less than 2. Patient is being admitted for hypoglycemia and severe hypokalemia. Patient denies any nausea vomiting diarrhea. Is not on any diuretics. On exam patient at this time is eating and denies any abdominal pain. Patient is well oriented at this time.   ED Course: Patient was started on D10 and had potassium replacement.  Review of Systems: As per HPI, rest all negative.   Past Medical History:  Diagnosis Date  . Anxiety   . Arthritis    "everywhere"  . Cataract    "both eyes; blood pressure's always too high to have them fixed" (09/11/2014)  . Chronic lower back pain   . Claudication in peripheral vascular disease ([place])    02/16/10: Left CIA 9.0x28 Omnilink and REIA 8.0x40 seff expanding Zil[place].   right subclavian artery stent 03/18/2008,  . Coronary artery disease   . Herniated lumbar intervertebral disc   . Hypertension   . IDDM (insulin dependent diabetes mellitus) ([place])   . Migraine    "used to have terrible migraines; stopped in the 1990's"  . Pacemaker   . Peripheral vascular disease ([place])   . Pneumonia X 2  . Renal disorder   . Renovascular hypertension     s/p left renal artery stent 12/2007.  S/P balloon angioplasty on 02/16/10 for ISR, BP was  controlled well since then.     . Stroke [facility]) 1999   Stroke and TIA in 1999 with right sided weakness,  now with residual right arm weakness (09/11/2014)  . UTI (lower urinary tract infection) 02/2015    Past Surgical History:  Procedure Laterality Date  . ABDOMINAL ANGIOGRAM N/A 07/06/2011   Procedure: ABDOMINAL ANGIOGRAM;  Surgeon: Laverda Page, MD;  Location: [facility] CATH LAB;  Service: Cardiovascular;  Laterality: N/A;  . APPENDECTOMY    . BACK SURGERY    . CAROTID ENDARTERECTOMY Left 1991    Stroke and TIA in 1999 with right sided weakness, now with residual right arm weakness  . CARPAL TUNNEL RELEASE Left   . COLONOSCOPY  07/30/2011   Procedure: COLONOSCOPY;  Surgeon: Inda Castle, MD;  Location: [place];  Service: Endoscopy;  Laterality: N/A;  gi bleed  . ESOPHAGOGASTRODUODENOSCOPY  07/30/2011   Procedure: ESOPHAGOGASTRODUODENOSCOPY (EGD);  Surgeon: Inda Castle, MD;  Location: [facility];  Service: Endoscopy;  Laterality: N/A;  . EXCISIONAL HEMORRHOIDECTOMY    . INSERT / REPLACE / REMOVE PACEMAKER    . LOWER EXTREMITY ANGIOGRAM Right 05/29/2012   Procedure: LOWER EXTREMITY ANGIOGRAM;  Surgeon: Laverda Page, MD;  Location: [facility] CATH LAB;  Service: Cardiovascular;  Laterality: Right;  . LUMBAR LAMINECTOMY     'herniated disc"  . PERIPHERAL VASCULAR CATHETERIZATION N/A 11/05/2014   Procedure: Abdominal Aortogram;  Surgeon: Serafina Mitchell, MD;  Location: [place] CV LAB;  Service: Cardiovascular;  Laterality: N/A;  . PERMANENT PACEMAKER INSERTION Left 06/28/2011  Procedure: PERMANENT PACEMAKER INSERTION;  Surgeon: Deboraha Sprang, MD;  Location[facility] CATH LAB;  Service: Cardiovascular;  Laterality: Left;  . RENAL ANGIOGRAM N/A 07/06/2011   Procedure: RENAL ANGIOGRAM;  Surgeon: Laverda Page, MD;  Location: [facility] CATH LAB;  Service: Cardiovascular;  Laterality: N/A;  . TONSILLECTOMY    . URETERAL STENT PLACEMENT    . VAGINAL HYSTERECTOMY       reports that she has been smoking Cigarettes.  She has been smoking about 0.00 packs per day for the past 70.00 years. She has  never used smokeless tobacco. She reports that she does not drink alcohol or use drugs.  Allergies  Allergen Reactions  . Norvasc [Amlodipine Besylate] Swelling  . Peanut-Containing Drug Products Other (See Comments)    Stomach pain    Family History  Problem Relation Age of Onset  . Cancer Father   . Heart attack Mother     Prior to Admission medications   Medication Sig Start Date End Date Taking? Authorizing Provider  aspirin EC 81 MG tablet Take 81 mg by mouth daily.    Historical Provider, MD  clopidogrel (PLAVIX) 75 MG tablet Take 75 mg by mouth daily.    Historical Provider, MD  feeding supplement, GLUCERNA SHAKE, (GLUCERNA SHAKE) LIQD Take 237 mLs by mouth 2 (two) times daily between meals. 01/16/15   Belkys A Regalado, MD  guaiFENesin (MUCINEX) 600 MG 12 hr tablet Take 1 tablet (600 mg total) by mouth 2 (two) times daily as needed. 02/15/15   Dorie Rank, MD  hydrALAZINE (APRESOLINE) 100 MG tablet Take 1 tablet (100 mg total) by mouth 4 (four) times daily. 01/16/15   Belkys A Regalado, MD  isosorbide mononitrate (IMDUR) 120 MG 24 hr tablet Take 1 tablet (120 mg total) by mouth daily. 12/12/12   Annita Brod, MD  linagliptin (TRADJENTA) 5 MG TABS tablet Take 1 tablet (5 mg total) by mouth daily. 12/01/14   Lauree Chandler, NP  metoprolol tartrate (LOPRESSOR) 25 MG tablet Take 25 mg by mouth 2 (two) times daily.    Historical Provider, MD  Omega-3 Fatty Acids (FISH OIL) 1000 MG CAPS Take 1,000 mg by mouth 2 (two) times daily.     Historical Provider, MD  pantoprazole (PROTONIX) 40 MG tablet Take 1 tablet (40 mg total) by mouth daily. 01/16/15   Belkys A Regalado, MD  polyethylene glycol (MIRALAX / GLYCOLAX) packet Take 17 g by mouth daily. 03/01/15   Lavina Hamman, MD    Physical Exam: Vitals:   10/28/15 2015 10/28/15 2245 10/28/15 2330 10/29/15 0030  BP: 112/62 170/76 163/97 104/62  Pulse: 78 103 76 70  Resp: 14 22 22 12   Temp:      TempSrc:      SpO2: 100% 100% 100%  100%  Weight:      Height:          Constitutional: Not in distress. Vitals:   10/28/15 2015 10/28/15 2245 10/28/15 2330 10/29/15 0030  BP: 112/62 170/76 163/97 104/62  Pulse: 78 103 76 70  Resp: 14 22 22 12   Temp:      TempSrc:      SpO2: 100% 100% 100% 100%  Weight:      Height:       Eyes: Anicteric. No pallor. ENMT: No discharge from the ears eyes nose or mouth. Neck: No neck rigidity. Respiratory: No rhonchi or crepitations. Cardiovascular: S1-S2 heard. Abdomen: Soft nontender bowel sounds present. Musculoskeletal: No edema. Skin: No rash.  Neurologic: Alert awake oriented to time place and person. Moves all extremities. Psychiatric: Appears normal.   Labs on Admission: I have personally reviewed following labs and imaging studies  CBC:  Recent Labs Lab 10/28/15 2000  WBC 6.9  HGB 13.2  HCT 40.0  MCV 95.0  PLT [id number]   Basic Metabolic Panel:  Recent Labs Lab 10/28/15 2056  NA 137  K <2.0*  CL 101  CO2 28  GLUCOSE 64*  BUN 21*  CREATININE 1.67*  CALCIUM 7.4*   GFR: Estimated Creatinine Clearance: 25.4 mL/min (by C-G formula based on SCr of 1.67 mg/dL). Liver Function Tests:  Recent Labs Lab 10/28/15 2056  AST 40  ALT 30  ALKPHOS 71  BILITOT 0.6  PROT 5.3*  ALBUMIN 2.5*   No results for input(s): LIPASE, AMYLASE in the last 168 hours. No results for input(s): AMMONIA in the last 168 hours. Coagulation Profile: No results for input(s): INR, PROTIME in the last 168 hours. Cardiac Enzymes: No results for input(s): CKTOTAL, CKMB, CKMBINDEX, TROPONINI in the last 168 hours. BNP (last 3 results) No results for input(s): PROBNP in the last 8760 hours. HbA1C: No results for input(s): HGBA1C in the last 72 hours. CBG:  Recent Labs Lab 10/28/15 1907 10/28/15 2013 10/28/15 2225  GLUCAP 60* 100* 32*   Lipid Profile: No results for input(s): CHOL, HDL, LDLCALC, TRIG, CHOLHDL, LDLDIRECT in the last 72 hours. Thyroid Function Tests: No  results for input(s): TSH, T4TOTAL, FREET4, T3FREE, THYROIDAB in the last 72 hours. Anemia Panel: No results for input(s): VITAMINB12, FOLATE, FERRITIN, TIBC, IRON, RETICCTPCT in the last 72 hours. Urine analysis:    Component Value Date/Time   COLORURINE YELLOW 10/29/2015 0006   APPEARANCEUR CLOUDY (A) 10/29/2015 0006   LABSPEC 1.015 10/29/2015 0006   PHURINE 5.5 10/29/2015 0006   GLUCOSEU 100 (A) 10/29/2015 0006   HGBUR NEGATIVE 10/29/2015 0006   BILIRUBINUR NEGATIVE 10/29/2015 0006   KETONESUR NEGATIVE 10/29/2015 0006   PROTEINUR >300 (A) 10/29/2015 0006   UROBILINOGEN 0.2 01/13/2015 2034   NITRITE NEGATIVE 10/29/2015 0006   LEUKOCYTESUR NEGATIVE 10/29/2015 0006   Sepsis Labs: @LABRCNTIP (procalcitonin:4,lacticidven:4) )No results found for this or any previous visit (from the past 240 hour(s)).   Radiological Exams on Admission: Dg Chest 2 View  Result Date: 10/28/2015 CLINICAL DATA:  Weakness and hypoglycemia EXAM: CHEST  2 VIEW COMPARISON:  Chest radiograph 02/26/2015 FINDINGS: Aortic atherosclerotic calcification. Vascular stent overlying the right lung apex. Left chest wall pacemaker. There is shallow lung inflation. Cardiomediastinal silhouette remains mildly enlarged. No pulmonary edema. Trace left pleural effusion. No pneumothorax. Linear opacity at the lung bases, worse on the right, likely atelectasis. No focal airspace consolidation. Advanced osteoarthrosis of the right shoulder. IMPRESSION: 1. Mild cardiomegaly and aortic atherosclerosis. 2. Bibasilar atelectasis without focal consolidation. 3. Severe right shoulder osteoarthrosis. Electronically Signed   By: Ulyses Jarred M.D.   On: 10/28/2015 21:55   Ct Head Wo Contrast  Result Date: 10/28/2015 CLINICAL DATA:  Right-sided weakness.  Hypoglycemia. EXAM: CT HEAD WITHOUT CONTRAST TECHNIQUE: Contiguous axial images were obtained from the base of the skull through the vertex without intravenous contrast. COMPARISON:  Head CT  02/15/2015 FINDINGS: Brain: There is no mass lesion, intraparenchymal hemorrhage or extra-axial collection. No evidence of acute cortical infarct. No hydrocephalus. There is confluent periventricular white matter hypoattenuation compatible with chronic microvascular disease. There are multiple old lacunar infarcts, including within the left pericapsular region and right pons. Ventricles and sulci are normal for patient age. Vascular: There  is atherosclerotic calcification within the posterior circulation and within the proximal intracranial internal carotid arteries. Skull: Normal Sinuses/Orbits: There is a trace amount of fluid within the sphenoid sinus. Normal orbits. Other: Visualized extracranial soft tissues are normal. IMPRESSION: 1. No acute intracranial abnormality. 2. Advanced chronic microvascular disease and multiple old lacunar infarcts. Electronically Signed   By: Ulyses Jarred M.D.   On: 10/28/2015 22:06    EKG: Independently reviewed. Paced rhythm.  Assessment/Plan Principal Problem:   Hypoglycemia Active Problems:   DM (diabetes mellitus), type 2, uncontrolled, periph vascular complic (HCC)   CKD (chronic kidney disease), stage III   HTN (hypertension)   Hyperkalemia    1. Hypoglycemia - hold Tradjenta. Continue D10 and closely follow CBGs every hourly. May be from poor oral intake. 2. Severe hypokalemia - could also be from poor oral intake. Replace potassium and recheck in a.m. Check magnesium levels. 3. Chronic diastolic CHF last EF measured in 2015 was 65-70% with grade 1 diastolic dysfunction - presently receiving IV fluids. 4. Chronic kidney disease stage IV - creatinine appears to be at baseline. 5. History of stroke and peripheral vascular disease - on antiplatelet agents.   DVT prophylaxis: Lovenox. Code Status: DO NOT RESUSCITATE.  Family Communication: Discussed with patient.  Disposition Plan: Home.  Consults called: None.  Admission status: Observation.     [name] MD [facility] Pager [phone].  If 7PM-7AM, please contact night-coverage [email or web address] Password [email or web address]  10/29/2015, 1:32 AM

## 2015-10-30 DIAGNOSIS — E1151 Type 2 diabetes mellitus with diabetic peripheral angiopathy without gangrene: Secondary | ICD-10-CM | POA: Diagnosis not present

## 2015-10-30 DIAGNOSIS — E162 Hypoglycemia, unspecified: Secondary | ICD-10-CM | POA: Diagnosis not present

## 2015-10-30 DIAGNOSIS — N183 Chronic kidney disease, stage 3 (moderate): Secondary | ICD-10-CM | POA: Diagnosis not present

## 2015-10-30 DIAGNOSIS — E876 Hypokalemia: Secondary | ICD-10-CM | POA: Diagnosis not present

## 2015-10-30 LAB — BASIC METABOLIC PANEL
ANION GAP: 7 (ref 5–15)
BUN: 21 mg/dL — ABNORMAL HIGH (ref 6–20)
CALCIUM: 6.9 mg/dL — AB (ref 8.9–10.3)
CHLORIDE: 108 mmol/L (ref 101–111)
CO2: 21 mmol/L — AB (ref 22–32)
Creatinine, Ser: 1.68 mg/dL — ABNORMAL HIGH (ref 0.44–1.00)
GFR calc non Af Amer: 28 mL/min — ABNORMAL LOW (ref >60)
GFR, EST AFRICAN AMERICAN: 32 mL/min — AB (ref >60)
GLUCOSE: 188 mg/dL — AB (ref 65–99)
POTASSIUM: 3.5 mmol/L (ref 3.5–5.1)
Sodium: 136 mmol/L (ref 135–145)

## 2015-10-30 LAB — HEMOGLOBIN A1C
Hgb A1c MFr Bld: 12.8 % — ABNORMAL HIGH (ref 4.8–5.6)
Mean Plasma Glucose: 321 mg/dL

## 2015-10-30 LAB — GLUCOSE, CAPILLARY
Glucose-Capillary: 143 mg/dL — ABNORMAL HIGH (ref 65–99)
Glucose-Capillary: 365 mg/dL — ABNORMAL HIGH (ref 65–99)

## 2015-10-30 MED ORDER — POTASSIUM CHLORIDE CRYS ER 20 MEQ PO TBCR
40.0000 meq | EXTENDED_RELEASE_TABLET | Freq: Once | ORAL | Status: AC
  Administered 2015-10-30: 40 meq via ORAL
  Filled 2015-10-30: qty 2

## 2015-10-30 MED ORDER — POTASSIUM CHLORIDE ER 20 MEQ PO TBCR: 40.0000 meq | EXTENDED_RELEASE_TABLET | Freq: Every day | ORAL | 0 refills | Status: DC

## 2015-10-30 MED ORDER — HYDRALAZINE HCL 100 MG PO TABS: 100.0000 mg | ORAL_TABLET | Freq: Four times a day (QID) | ORAL | 0 refills | Status: DC

## 2015-10-30 MED ORDER — ENOXAPARIN SODIUM 30 MG/0.3ML ~~LOC~~ SOLN: 30.0000 mg | Freq: Every day | SUBCUTANEOUS | Status: DC

## 2015-10-30 MED ORDER — SODIUM CHLORIDE 0.9 % IV SOLN
2.0000 g | Freq: Once | INTRAVENOUS | Status: AC
  Administered 2015-10-30: 2 g via INTRAVENOUS
  Filled 2015-10-30: qty 20

## 2015-10-30 MED ORDER — INSULIN NPH ISOPHANE & REGULAR (70-30) 100 UNIT/ML ~~LOC~~ SUSP: 5.0000 [IU] | Freq: Two times a day (BID) | SUBCUTANEOUS | 0 refills | Status: AC

## 2015-10-30 MED ORDER — MAGNESIUM GLUCONATE 30 MG PO TABS: 30.0000 mg | ORAL_TABLET | Freq: Two times a day (BID) | ORAL | 0 refills | Status: DC

## 2015-10-30 NOTE — Progress Notes (Signed)
Patient taken out via wheelchair for discharge home in care of granddaughter.  All questions answered to their expressed satisfaction.

## 2015-10-30 NOTE — Discharge Instructions (Signed)
Hypoglycemia Low blood sugar (hypoglycemia) means that the level of sugar in your blood is lower than it should be. Signs of low blood sugar include:  Getting sweaty.  Feeling hungry.  Feeling dizzy or weak.  Feeling sleepier than normal.  Feeling nervous.  Headaches.  Having a fast heartbeat. Low blood sugar can happen fast and can be an emergency. Your doctor can do tests to check your blood sugar level. You can have low blood sugar and not have diabetes. HOME CARE  Check your blood sugar as told by your doctor. If it is less than 70 mg/dl or as told by your doctor, take 1 of the following:  3 to 4 glucose tablets.   cup clear juice.   cup soda pop, not diet.  1 cup milk.  5 to 6 hard candies.  Recheck blood sugar after 15 minutes. Repeat until it is at the right level.  Eat a snack if it is more than 1 hour until the next meal.  Only take medicine as told by your doctor.  Do not skip meals. Eat on time.  Do not drink alcohol except with meals.  Check your blood glucose before driving.  Check your blood glucose before and after exercise.  Always carry treatment with you, such as glucose pills.  Always wear a medical alert bracelet if you have diabetes. GET HELP RIGHT AWAY IF:   Your blood glucose goes below 70 mg/dl or as told by your doctor, and you:  Are confused.  Are not able to swallow.  Pass out (faint).  You cannot treat yourself. You may need someone to help you.  You have low blood sugar problems often.  You have problems from your medicines.  You are not feeling better after 3 to 4 days.  You have vision changes. MAKE SURE YOU:   Understand these instructions.  Will watch this condition.  Will get help right away if you are not doing well or get worse.   This information is not intended to replace advice given to you by your health care provider. Make sure you discuss any questions you have with your health care provider.     Document Released: 05/19/2009 Document Revised: 03/15/2014 Document Reviewed: 10/29/2014 Elsevier Interactive Patient Education 2016 [employer]  

## 2015-10-30 NOTE — Discharge Summary (Signed)
Physician Discharge Summary  Carmen [name] [id number] DOB: 06-Oct-1933 DOA: 10/28/2015  PCP: Carmen Greenland, MD  Admit date: 10/28/2015 Discharge date: 10/30/2015  Recommendations for Outpatient Follow-up:  1. Follow up with PCP in 1 week to review blood sugars and potassium level 2. Home health RN, aid, PT   Discharge Diagnoses:  Principal Problem:   Hypoglycemia Active Problems:   Hypokalemia   DM (diabetes mellitus), type 2, uncontrolled, periph vascular complic (HCC)   CKD (chronic kidney disease), stage III   HTN (hypertension)   Discharge Condition: stable, improved  Diet recommendation: diabetic diet  Wt Readings from Last 3 Encounters:  10/30/15 59 kg (130 lb)  03/01/15 40.1 kg (88 lb 6.5 oz)  02/22/15 45.4 kg (100 lb)    History of present illness:  Carmen B Alfordis a 82 y.o.femalewith diabetes mellitus type 2, chronic kidney disease stage 3-4, diastolic CHF, sick sinus syndrome status post pacemaker placement was brought to the ER afterpatient's daughter found patient unresponsive the night of admission. Patient'sblood sugar was found to be around 30 and was given D50 ampule. Patient was brought to the ER and again blood sugar was found to be in the 60s. Patient's potassium level was less than 2. She was admitted for hypoglycemia and hypokalemia. She denied recent medication changes or infections.    Hospital Course:   Hypoglycemia, unclear etiology.  Her family members administer her medication and she has not had any medication changes.  She denied recent fevers or signs of infection and chest x-ray and urinalysis did not show signs of pneumonia or UTI.  She states she was eating normally prior to admission.  Her linagliptin and BID insulin were held. She was given D50 followed by dextrose infusion.  Her blood sugars improved and she was give sliding scale insulin and her CBGs rose.  Her A1c was 12.8.  If she continues to have problems with labile CBG, consider  work up for gastroparesis.  Asked that she reduce her insulin to 5 units BID for now and use her linagliptin.  If her CBGs remain consistently greater than 200, she should resume her 8 units of insulin again.  Follow up with PCP in 1 week to review blood sugars.  Severe hypokalemia, on lasix.  Her lasix was held.  She has not had weight loss to suggest poor oral intake. She received 124meq of KCL and recommend that she start daily potassium and magnesium supplements.  Because she seemed euvolemic at the time of admission, her lasix was resumed.    Chronic kidney disease stage IV, creatinine at baseline near 1.8.  Continued lasix at discharge.  Will need BMP and magnesium level in 1 week.  History of stroke and peripheral vascular disease, stable on aspirin, Plavix  Hypertension, blood pressure stable on hydralazine, Imdur, metoprolol  Telemetry demonstrated paced rhythm  Procedures:  none  Consultations:  none  Discharge Exam: Vitals:   10/30/15 1031 10/30/15 1135  BP:  (!) 165/79  Pulse: 67 (!) 59  Resp:  18  Temp:     Vitals:   10/30/15 0500 10/30/15 0735 10/30/15 1031 10/30/15 1135  BP:  139/65  (!) 165/79  Pulse:  (!) 59 67 (!) 59  Resp:  18  18  Temp:  98.1 F (36.7 C)    TempSrc:  Oral    SpO2:  100%  100%  Weight: 59 kg (130 lb)     Height:        General exam:  Adult  Female.  No acute distress, lying comfortably in bed HEENT:  NCAT, MMM Respiratory system: Clear to auscultation bilaterally Cardiovascular system: Regular rate and rhythm, normal S1/S2. No murmurs, rubs, gallops or clicks.  Warm extremities Gastrointestinal system: Normal active bowel sounds, soft, nondistended, nontender. MSK:  Normal tone and bulk, no lower extremity edema Neuro:  Grossly intact Psych:  Normal affect. Fair insight  Discharge Instructions  Discharge Instructions    (HEART FAILURE PATIENTS) Call MD:  Anytime you have any of the following symptoms: 1) 3 pound weight gain  in 24 hours or 5 pounds in 1 week 2) shortness of breath, with or without a dry hacking cough 3) swelling in the hands, feet or stomach 4) if you have to sleep on extra pillows at night in order to breathe.    Complete by:  As directed   Call MD for:  difficulty breathing, headache or visual disturbances    Complete by:  As directed   Call MD for:  extreme fatigue    Complete by:  As directed   Call MD for:  hives    Complete by:  As directed   Call MD for:  persistant dizziness or light-headedness    Complete by:  As directed   Call MD for:  persistant nausea and vomiting    Complete by:  As directed   Call MD for:  redness, tenderness, or signs of infection (pain, swelling, redness, odor or green/yellow discharge around incision site)    Complete by:  As directed   Call MD for:  severe uncontrolled pain    Complete by:  As directed   Call MD for:  temperature >100.4    Complete by:  As directed   Diet Carb Modified    Complete by:  As directed   Discharge instructions    Complete by:  As directed   Please start taking potassium and magnesium supplements.  Reduce the insulin to 5 units twice a day for now.  IF her blood sugars are consistently more than 200 before breakfast and before dinner, you may increase back to 8 units twice a day.  Please record her blood sugars four times per day if possible and bring with you to a follow up appointment with her primary care doctor in 1 week.   Increase activity slowly    Complete by:  As directed       Medication List    STOP taking these medications   guaiFENesin 600 MG 12 hr tablet Commonly known as:  MUCINEX   pantoprazole 40 MG tablet Commonly known as:  PROTONIX   polyethylene glycol packet Commonly known as:  MIRALAX / GLYCOLAX     TAKE these medications   aspirin EC 81 MG tablet Take 81 mg by mouth daily.   clopidogrel 75 MG tablet Commonly known as:  PLAVIX Take 75 mg by mouth daily.   COD LIVER OIL PO Take 1 tablet by mouth  daily.   feeding supplement (GLUCERNA SHAKE) Liqd Take 237 mLs by mouth 2 (two) times daily between meals.   Fish Oil 1000 MG Caps Take 1,000 mg by mouth 2 (two) times daily.   furosemide 20 MG tablet Commonly known as:  LASIX Take 20 mg by mouth 2 (two) times daily.   Garlic [id number] MG Caps Take by mouth.   hydrALAZINE 100 MG tablet Commonly known as:  APRESOLINE Take 1 tablet (100 mg total) by mouth 4 (four) times daily.   insulin NPH-regular Human (  70-30) 100 UNIT/ML injection Commonly known as:  NOVOLIN 70/30 Inject 5 Units into the skin 2 (two) times daily. What changed:  how much to take  when to take this  reasons to take this  additional instructions   isosorbide mononitrate 120 MG 24 hr tablet Commonly known as:  IMDUR Take 1 tablet (120 mg total) by mouth daily.   linagliptin 5 MG Tabs tablet Commonly known as:  TRADJENTA Take 1 tablet (5 mg total) by mouth daily.   magnesium gluconate 30 MG tablet Commonly known as:  MAGONATE Take 1 tablet (30 mg total) by mouth 2 (two) times daily.   metoprolol tartrate 25 MG tablet Commonly known as:  LOPRESSOR Take 25 mg by mouth 2 (two) times daily.   Potassium Chloride ER 20 MEQ Tbcr Take 40 mEq by mouth daily.      Follow-up Information    SANDERS,ROBYN N, MD Follow up in 1 week(s).   Specialty:  Internal Medicine Contact information: [address] [place] [address] [phone]            The results of significant diagnostics from this hospitalization (including imaging, microbiology, ancillary and laboratory) are listed below for reference.    Significant Diagnostic Studies: Dg Chest 2 View  Result Date: 10/28/2015 CLINICAL DATA:  Weakness and hypoglycemia EXAM: CHEST  2 VIEW COMPARISON:  Chest radiograph 02/26/2015 FINDINGS: Aortic atherosclerotic calcification. Vascular stent overlying the right lung apex. Left chest wall pacemaker. There is shallow lung inflation.  Cardiomediastinal silhouette remains mildly enlarged. No pulmonary edema. Trace left pleural effusion. No pneumothorax. Linear opacity at the lung bases, worse on the right, likely atelectasis. No focal airspace consolidation. Advanced osteoarthrosis of the right shoulder. IMPRESSION: 1. Mild cardiomegaly and aortic atherosclerosis. 2. Bibasilar atelectasis without focal consolidation. 3. Severe right shoulder osteoarthrosis. Electronically Signed   By: Ulyses Jarred M.D.   On: 10/28/2015 21:55   Ct Head Wo Contrast  Result Date: 10/28/2015 CLINICAL DATA:  Right-sided weakness.  Hypoglycemia. EXAM: CT HEAD WITHOUT CONTRAST TECHNIQUE: Contiguous axial images were obtained from the base of the skull through the vertex without intravenous contrast. COMPARISON:  Head CT 02/15/2015 FINDINGS: Brain: There is no mass lesion, intraparenchymal hemorrhage or extra-axial collection. No evidence of acute cortical infarct. No hydrocephalus. There is confluent periventricular white matter hypoattenuation compatible with chronic microvascular disease. There are multiple old lacunar infarcts, including within the left pericapsular region and right pons. Ventricles and sulci are normal for patient age. Vascular: There is atherosclerotic calcification within the posterior circulation and within the proximal intracranial internal carotid arteries. Skull: Normal Sinuses/Orbits: There is a trace amount of fluid within the sphenoid sinus. Normal orbits. Other: Visualized extracranial soft tissues are normal. IMPRESSION: 1. No acute intracranial abnormality. 2. Advanced chronic microvascular disease and multiple old lacunar infarcts. Electronically Signed   By: Ulyses Jarred M.D.   On: 10/28/2015 22:06    Microbiology: Recent Results (from the past 240 hour(s))  MRSA PCR Screening     Status: None   Collection Time: 10/29/15  6:52 AM  Result Value Ref Range Status   MRSA by PCR NEGATIVE NEGATIVE Final    Comment:        The  GeneXpert MRSA Assay (FDA approved for NASAL specimens only), is one component of a comprehensive MRSA colonization surveillance program. It is not intended to diagnose MRSA infection nor to guide or monitor treatment for MRSA infections.      Labs: Basic Metabolic Panel:  Recent  Labs Lab 10/28/15 2056 10/29/15 0340 10/29/15 0912 10/29/15 1250 10/30/15 0231  NA 137 136  --   --  136  K <2.0* 2.3*  --  3.0* 3.5  CL 101 105  --   --  108  CO2 28 22  --   --  21*  GLUCOSE 64* 297*  --   --  188*  BUN 21* 19  --   --  21*  CREATININE 1.67* 1.78*  --   --  1.68*  CALCIUM 7.4* 6.6*  --   --  6.9*  MG  --   --  1.5*  --   --    Liver Function Tests:  Recent Labs Lab 10/28/15 2056  AST 40  ALT 30  ALKPHOS 71  BILITOT 0.6  PROT 5.3*  ALBUMIN 2.5*   No results for input(s): LIPASE, AMYLASE in the last 168 hours. No results for input(s): AMMONIA in the last 168 hours. CBC:  Recent Labs Lab 10/28/15 2000 10/29/15 0650  WBC 6.9 4.6  HGB 13.2 12.3  HCT 40.0 36.7  MCV 95.0 94.3  PLT 165 147*   Cardiac Enzymes: No results for input(s): CKTOTAL, CKMB, CKMBINDEX, TROPONINI in the last 168 hours. BNP: BNP (last 3 results) No results for input(s): BNP in the last 8760 hours.  ProBNP (last 3 results) No results for input(s): PROBNP in the last 8760 hours.  CBG:  Recent Labs Lab 10/29/15 1225 10/29/15 1651 10/29/15 2117 10/30/15 0831 10/30/15 1213  GLUCAP 307* 278* 314* 143* 365*    Time coordinating discharge: 35 minutes  Signed:  Jeremaih Klima  [facility] 10/30/2015, 12:43 PM

## 2015-10-30 NOTE — Progress Notes (Signed)
Dr. Sheran Fava text paged to inquire of possible d/c today.  Patient's granddaughter is eager to know.

## 2015-10-30 NOTE — Progress Notes (Signed)
Inpatient Diabetes Program Recommendations  AACE/ADA: New Consensus Statement on Inpatient Glycemic Control (2015)  Target Ranges:  Prepandial:   less than 140 mg/dL      Peak postprandial:   less than 180 mg/dL (1-2 hours)      Critically ill patients:  140 - 180 mg/dL   Lab Results  Component Value Date   GLUCAP 143 (H) 10/30/2015   HGBA1C 12.8 (H) 10/29/2015    Review of Glycemic ControlResults for Carmen [name], Carmen [name] (MRN [id number]) as of 10/30/2015 10:23  Ref. Range 10/29/2015 09:50 10/29/2015 12:25 10/29/2015 16:51 10/29/2015 21:17 10/30/2015 08:31  Glucose-Capillary Latest Ref Range: 65 - 99 mg/dL 266 (H) 307 (H) 278 (H) 314 (H) 143 (H)    Diabetes history: Type 2 diabetes Outpatient Diabetes medications: Novolin 70/30 8 units bid, Tradjenta 5 mg daily Current orders for Inpatient glycemic control:  Novolog sensitive tid with meals  Inpatient Diabetes Program Recommendations:   Note that patient was on 70/30 insulin prior to admit.  She was admitted with hypoglycemia .  Note that 70/30 must be given with meal in order to prevent low blood sugars.  May do better with basal/bolus regimen.  Consider adding Levemir 8 units daily, instead of 70/30.  Will follow.  Thanks, Adah Perl, RN, BC-ADM Inpatient Diabetes Coordinator Pager [phone] (8a-5p)

## 2015-10-30 NOTE — Care Management Note (Signed)
Case Management Note  Patient Details  Name: CHENAE GOODLING MRN: [id number] Date of Birth: [birth year]  Subjective/Objective:    Patient for dc today, she chose [facility] , states she has had them before for [facility] services, referral made to [facility] for [facility], PT, OT, and aide.  Soc will begin 24-48 hrs post discharge.                  Action/Plan:   Expected Discharge Date:                  Expected Discharge Plan:  [place]  In-House Referral:     Discharge planning Services  CM Consult  Post Acute Care Choice:  Home Health Choice offered to:  Patient  DME Arranged:    DME Agency:     HH Arranged:  RN, PT, OT, Nurse's Aide [place] Agency:  [place]  Status of Service:  Completed, signed off  If discussed at [place] of Stay Meetings, dates discussed:    Additional Comments:  Zenon Mayo, RN 10/30/2015, 2:35 PM

## 2015-10-30 NOTE — Progress Notes (Signed)
Pharmacy notice sent requesting missing dose of Glucerna.

## 2015-10-30 NOTE — Evaluation (Signed)
Physical Therapy Evaluation Patient Details Name: Carmen [name] MRN: [id number] DOB: 12-02-1933 Today's Date: 10/30/2015   History of Present Illness  Carmen [name] is a 82 y.o. female with diabetes mellitus type 2, chronic kidney disease stage 3-4, diastolic CHF, sick sinus syndrome status post pacemaker placement was brought to the ER after patient's daughter found patient unresponsive the night of admission. Patient's blood sugar was found to be around 30 and was given D50 ampule. Patient was brought to the ER and again blood sugar was found to be in the 60s. Patient's potassium level was less than 2. She was admitted for hypoglycemia and severe hypokalemia. She denied recent medication changes or infections.     Clinical Impression  Pt admitted with above diagnosis. Pt currently with functional limitations due to the deficits listed below (see PT Problem List). Pt was able to ambulate but with LOB needing assist. Pt states husband helps her and she uses furniture to balance.  Encouraged use of her cane at home.  Pt agrees.  Will need HH therapy as below.   Pt will benefit from skilled PT to increase their independence and safety with mobility to allow discharge to the venue listed below.    Follow Up Recommendations Home health PT;Supervision/Assistance - 24 hour (HHOT, HHAide)    Equipment Recommendations  None recommended by PT    Recommendations for Other Services       Precautions / Restrictions Precautions Precautions: Fall Required Braces or Orthoses: Sling (right UE of which pt states "my shoulder has been like this") Restrictions Weight Bearing Restrictions: No      Mobility  Bed Mobility Overal bed mobility: Independent                Transfers Overall transfer level: Needs assistance Equipment used: None Transfers: Sit to/from Stand Sit to Stand: Supervision         General transfer comment: No LOB with standing.    Ambulation/Gait Ambulation/Gait  assistance: Min guard;Min assist;+2 safety/equipment Ambulation Distance (Feet): 350 Feet Assistive device: None Gait Pattern/deviations: Step-through pattern;Decreased stride length;Drifts right/left;Staggering right;Staggering left;Trunk flexed   Gait velocity interpretation: Below normal speed for age/gender General Gait Details: Pt staggered occasionally needing steadying assist and cues for safety. Pt states she holds furniture at home.  Pt also states she staggers at times.  Encouraged pt to use her cane at home for safety and pt agrees.    Stairs            Wheelchair Mobility    Modified Rankin (Stroke Patients Only)       Balance Overall balance assessment: Needs assistance Sitting-balance support: No upper extremity supported;Feet supported Sitting balance-Leahy Scale: Fair     Standing balance support: No upper extremity supported;During functional activity Standing balance-Leahy Scale: Poor Standing balance comment: Pt with several LOB needing min assist to steady pt.  Pt states she holds furniture at home. Encouraged pt to use cane and she states she rarely walks alone.               High level balance activites: Turns;Sudden stops High Level Balance Comments: min assist for stability.              Pertinent Vitals/Pain Pain Assessment: No/denies pain  VSS    Home Living Family/patient expects to be discharged to:: Private residence Living Arrangements: Spouse/significant other;Children Available Help at Discharge: Family;Available 24 hours/day (husband 24 hours and son who works during day) Type of Home: House Home Access:  Stairs to enter Entrance Stairs-Rails: Left Entrance Stairs-Number of Steps: 2 Home Layout: One level Home Equipment: Walker - 2 wheels;Cane - single point;Bedside commode;Hand held shower head;Tub bench      Prior Function Level of Independence: Independent         Comments: per pt report she needed closer  supervision for her activities, but no physical assist     Hand Dominance   Dominant Hand: Right    Extremity/Trunk Assessment   Upper Extremity Assessment: Defer to OT evaluation           Lower Extremity Assessment: Generalized weakness      Cervical / Trunk Assessment: Kyphotic  Communication   Communication: No difficulties  Cognition Arousal/Alertness: Awake/alert Behavior During Therapy: WFL for tasks assessed/performed Overall Cognitive Status: Within Functional Limits for tasks assessed                      General Comments      Exercises General Exercises - Lower Extremity Ankle Circles/Pumps: AROM;Both;5 reps;Supine Long Arc Quad: AROM;Both;5 reps;Seated      Assessment/Plan    PT Assessment Patient needs continued PT services  PT Diagnosis Generalized weakness;Difficulty walking   PT Problem List Decreased balance;Decreased activity tolerance;Decreased mobility;Decreased knowledge of use of DME;Decreased safety awareness;Decreased knowledge of precautions  PT Treatment Interventions DME instruction;Gait training;Stair training;Functional mobility training;Therapeutic activities;Therapeutic exercise;Balance training;Patient/family education   PT Goals (Current goals can be found in the Care Plan section) Acute Rehab PT Goals Patient Stated Goal: to go home today PT Goal Formulation: With patient Time For Goal Achievement: 11/13/15 Potential to Achieve Goals: Good    Frequency Min 3X/week   Barriers to discharge        Co-evaluation               End of Session Equipment Utilized During Treatment: Gait belt Activity Tolerance: Patient limited by fatigue Patient left: in chair;with call bell/phone within reach;with chair alarm set Nurse Communication: Mobility status    Functional Assessment Tool Used: clinical judgment Functional Limitation: Mobility: Walking and moving around Mobility: Walking and Moving Around Current Status  [id number]): At least 20 percent but less than 40 percent impaired, limited or restricted Mobility: Walking and Moving Around Goal Status [phone]): At least 1 percent but less than 20 percent impaired, limited or restricted    Time: 1035-1049 PT Time Calculation (min) (ACUTE ONLY): 14 min   Charges:   PT Evaluation $PT Eval Moderate Complexity: 1 Procedure     PT G Codes:   PT G-Codes **NOT FOR INPATIENT CLASS** Functional Assessment Tool Used: clinical judgment Functional Limitation: Mobility: Walking and moving around Mobility: Walking and Moving Around Current Status [id number]): At least 20 percent but less than 40 percent impaired, limited or restricted Mobility: Walking and Moving Around Goal Status [phone]): At least 1 percent but less than 20 percent impaired, limited or restricted    Denice Paradise 10/30/2015, 12:40 PM Kenslee Achorn,PT Acute Rehabilitation [phone] (pager)

## 2015-10-30 NOTE — Progress Notes (Signed)
Dr. Sheran Fava paged and asked to come speak with the patient's granddaughter.  She is upset she drove from [place] and there are no discharge orders for the patient.

## 2015-10-31 DIAGNOSIS — I495 Sick sinus syndrome: Secondary | ICD-10-CM | POA: Diagnosis not present

## 2015-10-31 DIAGNOSIS — I69354 Hemiplegia and hemiparesis following cerebral infarction affecting left non-dominant side: Secondary | ICD-10-CM | POA: Diagnosis not present

## 2015-10-31 DIAGNOSIS — I2510 Atherosclerotic heart disease of native coronary artery without angina pectoris: Secondary | ICD-10-CM | POA: Diagnosis not present

## 2015-10-31 DIAGNOSIS — I5030 Unspecified diastolic (congestive) heart failure: Secondary | ICD-10-CM | POA: Diagnosis not present

## 2015-10-31 DIAGNOSIS — I110 Hypertensive heart disease with heart failure: Secondary | ICD-10-CM | POA: Diagnosis not present

## 2015-10-31 DIAGNOSIS — M1990 Unspecified osteoarthritis, unspecified site: Secondary | ICD-10-CM | POA: Diagnosis not present

## 2015-10-31 DIAGNOSIS — E1351 Other specified diabetes mellitus with diabetic peripheral angiopathy without gangrene: Secondary | ICD-10-CM | POA: Diagnosis not present

## 2015-10-31 DIAGNOSIS — N184 Chronic kidney disease, stage 4 (severe): Secondary | ICD-10-CM | POA: Diagnosis not present

## 2015-10-31 DIAGNOSIS — E1122 Type 2 diabetes mellitus with diabetic chronic kidney disease: Secondary | ICD-10-CM | POA: Diagnosis not present

## 2015-11-03 DIAGNOSIS — M1990 Unspecified osteoarthritis, unspecified site: Secondary | ICD-10-CM | POA: Diagnosis not present

## 2015-11-03 DIAGNOSIS — N184 Chronic kidney disease, stage 4 (severe): Secondary | ICD-10-CM | POA: Diagnosis not present

## 2015-11-03 DIAGNOSIS — E1351 Other specified diabetes mellitus with diabetic peripheral angiopathy without gangrene: Secondary | ICD-10-CM | POA: Diagnosis not present

## 2015-11-03 DIAGNOSIS — E1122 Type 2 diabetes mellitus with diabetic chronic kidney disease: Secondary | ICD-10-CM | POA: Diagnosis not present

## 2015-11-03 DIAGNOSIS — I69354 Hemiplegia and hemiparesis following cerebral infarction affecting left non-dominant side: Secondary | ICD-10-CM | POA: Diagnosis not present

## 2015-11-03 DIAGNOSIS — I5030 Unspecified diastolic (congestive) heart failure: Secondary | ICD-10-CM | POA: Diagnosis not present

## 2015-11-03 DIAGNOSIS — I2510 Atherosclerotic heart disease of native coronary artery without angina pectoris: Secondary | ICD-10-CM | POA: Diagnosis not present

## 2015-11-03 DIAGNOSIS — I110 Hypertensive heart disease with heart failure: Secondary | ICD-10-CM | POA: Diagnosis not present

## 2015-11-03 DIAGNOSIS — I495 Sick sinus syndrome: Secondary | ICD-10-CM | POA: Diagnosis not present

## 2015-11-04 DIAGNOSIS — I2510 Atherosclerotic heart disease of native coronary artery without angina pectoris: Secondary | ICD-10-CM | POA: Diagnosis not present

## 2015-11-04 DIAGNOSIS — I495 Sick sinus syndrome: Secondary | ICD-10-CM | POA: Diagnosis not present

## 2015-11-04 DIAGNOSIS — I110 Hypertensive heart disease with heart failure: Secondary | ICD-10-CM | POA: Diagnosis not present

## 2015-11-04 DIAGNOSIS — I69354 Hemiplegia and hemiparesis following cerebral infarction affecting left non-dominant side: Secondary | ICD-10-CM | POA: Diagnosis not present

## 2015-11-04 DIAGNOSIS — E1351 Other specified diabetes mellitus with diabetic peripheral angiopathy without gangrene: Secondary | ICD-10-CM | POA: Diagnosis not present

## 2015-11-04 DIAGNOSIS — M1990 Unspecified osteoarthritis, unspecified site: Secondary | ICD-10-CM | POA: Diagnosis not present

## 2015-11-04 DIAGNOSIS — I5030 Unspecified diastolic (congestive) heart failure: Secondary | ICD-10-CM | POA: Diagnosis not present

## 2015-11-04 DIAGNOSIS — N184 Chronic kidney disease, stage 4 (severe): Secondary | ICD-10-CM | POA: Diagnosis not present

## 2015-11-04 DIAGNOSIS — E1122 Type 2 diabetes mellitus with diabetic chronic kidney disease: Secondary | ICD-10-CM | POA: Diagnosis not present

## 2015-11-05 DIAGNOSIS — E1351 Other specified diabetes mellitus with diabetic peripheral angiopathy without gangrene: Secondary | ICD-10-CM | POA: Diagnosis not present

## 2015-11-05 DIAGNOSIS — E162 Hypoglycemia, unspecified: Secondary | ICD-10-CM | POA: Diagnosis not present

## 2015-11-05 DIAGNOSIS — I69354 Hemiplegia and hemiparesis following cerebral infarction affecting left non-dominant side: Secondary | ICD-10-CM | POA: Diagnosis not present

## 2015-11-05 DIAGNOSIS — N184 Chronic kidney disease, stage 4 (severe): Secondary | ICD-10-CM | POA: Diagnosis not present

## 2015-11-05 DIAGNOSIS — E876 Hypokalemia: Secondary | ICD-10-CM | POA: Diagnosis not present

## 2015-11-05 DIAGNOSIS — M1990 Unspecified osteoarthritis, unspecified site: Secondary | ICD-10-CM | POA: Diagnosis not present

## 2015-11-05 DIAGNOSIS — I5030 Unspecified diastolic (congestive) heart failure: Secondary | ICD-10-CM | POA: Diagnosis not present

## 2015-11-05 DIAGNOSIS — E1122 Type 2 diabetes mellitus with diabetic chronic kidney disease: Secondary | ICD-10-CM | POA: Diagnosis not present

## 2015-11-05 DIAGNOSIS — I495 Sick sinus syndrome: Secondary | ICD-10-CM | POA: Diagnosis not present

## 2015-11-05 DIAGNOSIS — I2510 Atherosclerotic heart disease of native coronary artery without angina pectoris: Secondary | ICD-10-CM | POA: Diagnosis not present

## 2015-11-05 DIAGNOSIS — I110 Hypertensive heart disease with heart failure: Secondary | ICD-10-CM | POA: Diagnosis not present

## 2015-11-06 DIAGNOSIS — M1990 Unspecified osteoarthritis, unspecified site: Secondary | ICD-10-CM | POA: Diagnosis not present

## 2015-11-06 DIAGNOSIS — N184 Chronic kidney disease, stage 4 (severe): Secondary | ICD-10-CM | POA: Diagnosis not present

## 2015-11-06 DIAGNOSIS — E1122 Type 2 diabetes mellitus with diabetic chronic kidney disease: Secondary | ICD-10-CM | POA: Diagnosis not present

## 2015-11-06 DIAGNOSIS — I2510 Atherosclerotic heart disease of native coronary artery without angina pectoris: Secondary | ICD-10-CM | POA: Diagnosis not present

## 2015-11-06 DIAGNOSIS — E1351 Other specified diabetes mellitus with diabetic peripheral angiopathy without gangrene: Secondary | ICD-10-CM | POA: Diagnosis not present

## 2015-11-06 DIAGNOSIS — I495 Sick sinus syndrome: Secondary | ICD-10-CM | POA: Diagnosis not present

## 2015-11-06 DIAGNOSIS — I5030 Unspecified diastolic (congestive) heart failure: Secondary | ICD-10-CM | POA: Diagnosis not present

## 2015-11-06 DIAGNOSIS — I110 Hypertensive heart disease with heart failure: Secondary | ICD-10-CM | POA: Diagnosis not present

## 2015-11-06 DIAGNOSIS — I69354 Hemiplegia and hemiparesis following cerebral infarction affecting left non-dominant side: Secondary | ICD-10-CM | POA: Diagnosis not present

## 2015-11-07 DIAGNOSIS — N184 Chronic kidney disease, stage 4 (severe): Secondary | ICD-10-CM | POA: Diagnosis not present

## 2015-11-07 DIAGNOSIS — I110 Hypertensive heart disease with heart failure: Secondary | ICD-10-CM | POA: Diagnosis not present

## 2015-11-07 DIAGNOSIS — I509 Heart failure, unspecified: Secondary | ICD-10-CM | POA: Diagnosis not present

## 2015-11-07 DIAGNOSIS — M1990 Unspecified osteoarthritis, unspecified site: Secondary | ICD-10-CM | POA: Diagnosis not present

## 2015-11-07 DIAGNOSIS — E1122 Type 2 diabetes mellitus with diabetic chronic kidney disease: Secondary | ICD-10-CM | POA: Diagnosis not present

## 2015-11-07 DIAGNOSIS — E1351 Other specified diabetes mellitus with diabetic peripheral angiopathy without gangrene: Secondary | ICD-10-CM | POA: Diagnosis not present

## 2015-11-07 DIAGNOSIS — R0602 Shortness of breath: Secondary | ICD-10-CM | POA: Diagnosis not present

## 2015-11-07 DIAGNOSIS — I69354 Hemiplegia and hemiparesis following cerebral infarction affecting left non-dominant side: Secondary | ICD-10-CM | POA: Diagnosis not present

## 2015-11-07 DIAGNOSIS — I5030 Unspecified diastolic (congestive) heart failure: Secondary | ICD-10-CM | POA: Diagnosis not present

## 2015-11-07 DIAGNOSIS — I2510 Atherosclerotic heart disease of native coronary artery without angina pectoris: Secondary | ICD-10-CM | POA: Diagnosis not present

## 2015-11-07 DIAGNOSIS — I495 Sick sinus syndrome: Secondary | ICD-10-CM | POA: Diagnosis not present

## 2015-11-07 DIAGNOSIS — E1101 Type 2 diabetes mellitus with hyperosmolarity with coma: Secondary | ICD-10-CM | POA: Diagnosis not present

## 2015-11-10 DIAGNOSIS — I2510 Atherosclerotic heart disease of native coronary artery without angina pectoris: Secondary | ICD-10-CM | POA: Diagnosis not present

## 2015-11-10 DIAGNOSIS — I495 Sick sinus syndrome: Secondary | ICD-10-CM | POA: Diagnosis not present

## 2015-11-10 DIAGNOSIS — E1122 Type 2 diabetes mellitus with diabetic chronic kidney disease: Secondary | ICD-10-CM | POA: Diagnosis not present

## 2015-11-10 DIAGNOSIS — I110 Hypertensive heart disease with heart failure: Secondary | ICD-10-CM | POA: Diagnosis not present

## 2015-11-10 DIAGNOSIS — E1351 Other specified diabetes mellitus with diabetic peripheral angiopathy without gangrene: Secondary | ICD-10-CM | POA: Diagnosis not present

## 2015-11-10 DIAGNOSIS — I5030 Unspecified diastolic (congestive) heart failure: Secondary | ICD-10-CM | POA: Diagnosis not present

## 2015-11-10 DIAGNOSIS — N184 Chronic kidney disease, stage 4 (severe): Secondary | ICD-10-CM | POA: Diagnosis not present

## 2015-11-10 DIAGNOSIS — M1990 Unspecified osteoarthritis, unspecified site: Secondary | ICD-10-CM | POA: Diagnosis not present

## 2015-11-10 DIAGNOSIS — I69354 Hemiplegia and hemiparesis following cerebral infarction affecting left non-dominant side: Secondary | ICD-10-CM | POA: Diagnosis not present

## 2015-11-11 DIAGNOSIS — I110 Hypertensive heart disease with heart failure: Secondary | ICD-10-CM | POA: Diagnosis not present

## 2015-11-11 DIAGNOSIS — E1122 Type 2 diabetes mellitus with diabetic chronic kidney disease: Secondary | ICD-10-CM | POA: Diagnosis not present

## 2015-11-11 DIAGNOSIS — E1351 Other specified diabetes mellitus with diabetic peripheral angiopathy without gangrene: Secondary | ICD-10-CM | POA: Diagnosis not present

## 2015-11-11 DIAGNOSIS — I5030 Unspecified diastolic (congestive) heart failure: Secondary | ICD-10-CM | POA: Diagnosis not present

## 2015-11-11 DIAGNOSIS — N184 Chronic kidney disease, stage 4 (severe): Secondary | ICD-10-CM | POA: Diagnosis not present

## 2015-11-11 DIAGNOSIS — M1990 Unspecified osteoarthritis, unspecified site: Secondary | ICD-10-CM | POA: Diagnosis not present

## 2015-11-11 DIAGNOSIS — I69354 Hemiplegia and hemiparesis following cerebral infarction affecting left non-dominant side: Secondary | ICD-10-CM | POA: Diagnosis not present

## 2015-11-11 DIAGNOSIS — I495 Sick sinus syndrome: Secondary | ICD-10-CM | POA: Diagnosis not present

## 2015-11-11 DIAGNOSIS — I2510 Atherosclerotic heart disease of native coronary artery without angina pectoris: Secondary | ICD-10-CM | POA: Diagnosis not present

## 2015-11-13 DIAGNOSIS — I5030 Unspecified diastolic (congestive) heart failure: Secondary | ICD-10-CM | POA: Diagnosis not present

## 2015-11-13 DIAGNOSIS — N184 Chronic kidney disease, stage 4 (severe): Secondary | ICD-10-CM | POA: Diagnosis not present

## 2015-11-13 DIAGNOSIS — E1122 Type 2 diabetes mellitus with diabetic chronic kidney disease: Secondary | ICD-10-CM | POA: Diagnosis not present

## 2015-11-13 DIAGNOSIS — E1351 Other specified diabetes mellitus with diabetic peripheral angiopathy without gangrene: Secondary | ICD-10-CM | POA: Diagnosis not present

## 2015-11-13 DIAGNOSIS — I69354 Hemiplegia and hemiparesis following cerebral infarction affecting left non-dominant side: Secondary | ICD-10-CM | POA: Diagnosis not present

## 2015-11-13 DIAGNOSIS — I2510 Atherosclerotic heart disease of native coronary artery without angina pectoris: Secondary | ICD-10-CM | POA: Diagnosis not present

## 2015-11-13 DIAGNOSIS — I110 Hypertensive heart disease with heart failure: Secondary | ICD-10-CM | POA: Diagnosis not present

## 2015-11-13 DIAGNOSIS — I495 Sick sinus syndrome: Secondary | ICD-10-CM | POA: Diagnosis not present

## 2015-11-13 DIAGNOSIS — M1990 Unspecified osteoarthritis, unspecified site: Secondary | ICD-10-CM | POA: Diagnosis not present

## 2015-11-14 DIAGNOSIS — E1351 Other specified diabetes mellitus with diabetic peripheral angiopathy without gangrene: Secondary | ICD-10-CM | POA: Diagnosis not present

## 2015-11-14 DIAGNOSIS — I69354 Hemiplegia and hemiparesis following cerebral infarction affecting left non-dominant side: Secondary | ICD-10-CM | POA: Diagnosis not present

## 2015-11-14 DIAGNOSIS — I110 Hypertensive heart disease with heart failure: Secondary | ICD-10-CM | POA: Diagnosis not present

## 2015-11-14 DIAGNOSIS — I2510 Atherosclerotic heart disease of native coronary artery without angina pectoris: Secondary | ICD-10-CM | POA: Diagnosis not present

## 2015-11-14 DIAGNOSIS — M1990 Unspecified osteoarthritis, unspecified site: Secondary | ICD-10-CM | POA: Diagnosis not present

## 2015-11-14 DIAGNOSIS — I129 Hypertensive chronic kidney disease with stage 1 through stage 4 chronic kidney disease, or unspecified chronic kidney disease: Secondary | ICD-10-CM | POA: Diagnosis not present

## 2015-11-14 DIAGNOSIS — E1122 Type 2 diabetes mellitus with diabetic chronic kidney disease: Secondary | ICD-10-CM | POA: Diagnosis not present

## 2015-11-14 DIAGNOSIS — I5030 Unspecified diastolic (congestive) heart failure: Secondary | ICD-10-CM | POA: Diagnosis not present

## 2015-11-14 DIAGNOSIS — N184 Chronic kidney disease, stage 4 (severe): Secondary | ICD-10-CM | POA: Diagnosis not present

## 2015-11-14 DIAGNOSIS — N08 Glomerular disorders in diseases classified elsewhere: Secondary | ICD-10-CM | POA: Diagnosis not present

## 2015-11-14 DIAGNOSIS — I495 Sick sinus syndrome: Secondary | ICD-10-CM | POA: Diagnosis not present

## 2015-11-18 DIAGNOSIS — E1122 Type 2 diabetes mellitus with diabetic chronic kidney disease: Secondary | ICD-10-CM | POA: Diagnosis not present

## 2015-11-18 DIAGNOSIS — I2510 Atherosclerotic heart disease of native coronary artery without angina pectoris: Secondary | ICD-10-CM | POA: Diagnosis not present

## 2015-11-18 DIAGNOSIS — I5030 Unspecified diastolic (congestive) heart failure: Secondary | ICD-10-CM | POA: Diagnosis not present

## 2015-11-18 DIAGNOSIS — I495 Sick sinus syndrome: Secondary | ICD-10-CM | POA: Diagnosis not present

## 2015-11-18 DIAGNOSIS — I69354 Hemiplegia and hemiparesis following cerebral infarction affecting left non-dominant side: Secondary | ICD-10-CM | POA: Diagnosis not present

## 2015-11-18 DIAGNOSIS — E1351 Other specified diabetes mellitus with diabetic peripheral angiopathy without gangrene: Secondary | ICD-10-CM | POA: Diagnosis not present

## 2015-11-18 DIAGNOSIS — I110 Hypertensive heart disease with heart failure: Secondary | ICD-10-CM | POA: Diagnosis not present

## 2015-11-18 DIAGNOSIS — N184 Chronic kidney disease, stage 4 (severe): Secondary | ICD-10-CM | POA: Diagnosis not present

## 2015-11-18 DIAGNOSIS — M1990 Unspecified osteoarthritis, unspecified site: Secondary | ICD-10-CM | POA: Diagnosis not present

## 2015-11-19 DIAGNOSIS — R531 Weakness: Secondary | ICD-10-CM | POA: Diagnosis not present

## 2015-11-21 DIAGNOSIS — I5030 Unspecified diastolic (congestive) heart failure: Secondary | ICD-10-CM | POA: Diagnosis not present

## 2015-11-21 DIAGNOSIS — M1990 Unspecified osteoarthritis, unspecified site: Secondary | ICD-10-CM | POA: Diagnosis not present

## 2015-11-21 DIAGNOSIS — E1122 Type 2 diabetes mellitus with diabetic chronic kidney disease: Secondary | ICD-10-CM | POA: Diagnosis not present

## 2015-11-21 DIAGNOSIS — I69354 Hemiplegia and hemiparesis following cerebral infarction affecting left non-dominant side: Secondary | ICD-10-CM | POA: Diagnosis not present

## 2015-11-21 DIAGNOSIS — N184 Chronic kidney disease, stage 4 (severe): Secondary | ICD-10-CM | POA: Diagnosis not present

## 2015-11-21 DIAGNOSIS — E1351 Other specified diabetes mellitus with diabetic peripheral angiopathy without gangrene: Secondary | ICD-10-CM | POA: Diagnosis not present

## 2015-11-21 DIAGNOSIS — I495 Sick sinus syndrome: Secondary | ICD-10-CM | POA: Diagnosis not present

## 2015-11-21 DIAGNOSIS — I110 Hypertensive heart disease with heart failure: Secondary | ICD-10-CM | POA: Diagnosis not present

## 2015-11-21 DIAGNOSIS — I2510 Atherosclerotic heart disease of native coronary artery without angina pectoris: Secondary | ICD-10-CM | POA: Diagnosis not present

## 2015-11-25 DIAGNOSIS — E1351 Other specified diabetes mellitus with diabetic peripheral angiopathy without gangrene: Secondary | ICD-10-CM | POA: Diagnosis not present

## 2015-11-25 DIAGNOSIS — I5030 Unspecified diastolic (congestive) heart failure: Secondary | ICD-10-CM | POA: Diagnosis not present

## 2015-11-25 DIAGNOSIS — I110 Hypertensive heart disease with heart failure: Secondary | ICD-10-CM | POA: Diagnosis not present

## 2015-11-25 DIAGNOSIS — I2510 Atherosclerotic heart disease of native coronary artery without angina pectoris: Secondary | ICD-10-CM | POA: Diagnosis not present

## 2015-11-25 DIAGNOSIS — I495 Sick sinus syndrome: Secondary | ICD-10-CM | POA: Diagnosis not present

## 2015-11-25 DIAGNOSIS — E1122 Type 2 diabetes mellitus with diabetic chronic kidney disease: Secondary | ICD-10-CM | POA: Diagnosis not present

## 2015-11-25 DIAGNOSIS — I69354 Hemiplegia and hemiparesis following cerebral infarction affecting left non-dominant side: Secondary | ICD-10-CM | POA: Diagnosis not present

## 2015-11-25 DIAGNOSIS — M1990 Unspecified osteoarthritis, unspecified site: Secondary | ICD-10-CM | POA: Diagnosis not present

## 2015-11-25 DIAGNOSIS — N184 Chronic kidney disease, stage 4 (severe): Secondary | ICD-10-CM | POA: Diagnosis not present

## 2015-12-01 DIAGNOSIS — E876 Hypokalemia: Secondary | ICD-10-CM | POA: Diagnosis not present

## 2015-12-01 DIAGNOSIS — Z720 Tobacco use: Secondary | ICD-10-CM | POA: Diagnosis not present

## 2015-12-01 DIAGNOSIS — E162 Hypoglycemia, unspecified: Secondary | ICD-10-CM | POA: Diagnosis not present

## 2015-12-04 DIAGNOSIS — E876 Hypokalemia: Secondary | ICD-10-CM | POA: Diagnosis not present

## 2015-12-04 DIAGNOSIS — Z720 Tobacco use: Secondary | ICD-10-CM | POA: Diagnosis not present

## 2015-12-04 DIAGNOSIS — E162 Hypoglycemia, unspecified: Secondary | ICD-10-CM | POA: Diagnosis not present

## 2015-12-07 DIAGNOSIS — E1101 Type 2 diabetes mellitus with hyperosmolarity with coma: Secondary | ICD-10-CM | POA: Diagnosis not present

## 2015-12-07 DIAGNOSIS — R0602 Shortness of breath: Secondary | ICD-10-CM | POA: Diagnosis not present

## 2015-12-07 DIAGNOSIS — I509 Heart failure, unspecified: Secondary | ICD-10-CM | POA: Diagnosis not present

## 2016-01-07 DIAGNOSIS — E1101 Type 2 diabetes mellitus with hyperosmolarity with coma: Secondary | ICD-10-CM | POA: Diagnosis not present

## 2016-01-07 DIAGNOSIS — I509 Heart failure, unspecified: Secondary | ICD-10-CM | POA: Diagnosis not present

## 2016-01-07 DIAGNOSIS — R0602 Shortness of breath: Secondary | ICD-10-CM | POA: Diagnosis not present

## 2016-01-13 ENCOUNTER — Emergency Department (HOSPITAL_COMMUNITY): Payer: Commercial Managed Care - HMO

## 2016-01-13 ENCOUNTER — Inpatient Hospital Stay (HOSPITAL_COMMUNITY)
Admission: EM | Admit: 2016-01-13 | Discharge: 2016-01-15 | DRG: 641 | Disposition: A | Discharge status: Home / Self Care | Payer: Commercial Managed Care - HMO | Attending: Internal Medicine | Admitting: Internal Medicine

## 2016-01-13 ENCOUNTER — Encounter (HOSPITAL_COMMUNITY): Payer: Self-pay | Admitting: Emergency Medicine

## 2016-01-13 DIAGNOSIS — Z91010 Allergy to peanuts: Secondary | ICD-10-CM | POA: Diagnosis not present

## 2016-01-13 DIAGNOSIS — F419 Anxiety disorder, unspecified: Secondary | ICD-10-CM | POA: Diagnosis not present

## 2016-01-13 DIAGNOSIS — E1165 Type 2 diabetes mellitus with hyperglycemia: Secondary | ICD-10-CM | POA: Diagnosis present

## 2016-01-13 DIAGNOSIS — N183 Chronic kidney disease, stage 3 unspecified: Secondary | ICD-10-CM | POA: Diagnosis present

## 2016-01-13 DIAGNOSIS — Z66 Do not resuscitate: Secondary | ICD-10-CM | POA: Diagnosis not present

## 2016-01-13 DIAGNOSIS — R112 Nausea with vomiting, unspecified: Secondary | ICD-10-CM

## 2016-01-13 DIAGNOSIS — I2510 Atherosclerotic heart disease of native coronary artery without angina pectoris: Secondary | ICD-10-CM | POA: Diagnosis present

## 2016-01-13 DIAGNOSIS — Z888 Allergy status to other drugs, medicaments and biological substances status: Secondary | ICD-10-CM

## 2016-01-13 DIAGNOSIS — R079 Chest pain, unspecified: Secondary | ICD-10-CM | POA: Diagnosis present

## 2016-01-13 DIAGNOSIS — Z8249 Family history of ischemic heart disease and other diseases of the circulatory system: Secondary | ICD-10-CM | POA: Diagnosis not present

## 2016-01-13 DIAGNOSIS — I10 Essential (primary) hypertension: Secondary | ICD-10-CM | POA: Diagnosis present

## 2016-01-13 DIAGNOSIS — Z7902 Long term (current) use of antithrombotics/antiplatelets: Secondary | ICD-10-CM

## 2016-01-13 DIAGNOSIS — Z950 Presence of cardiac pacemaker: Secondary | ICD-10-CM

## 2016-01-13 DIAGNOSIS — N179 Acute kidney failure, unspecified: Secondary | ICD-10-CM

## 2016-01-13 DIAGNOSIS — E1151 Type 2 diabetes mellitus with diabetic peripheral angiopathy without gangrene: Secondary | ICD-10-CM | POA: Diagnosis present

## 2016-01-13 DIAGNOSIS — I150 Renovascular hypertension: Secondary | ICD-10-CM | POA: Diagnosis not present

## 2016-01-13 DIAGNOSIS — Z7982 Long term (current) use of aspirin: Secondary | ICD-10-CM

## 2016-01-13 DIAGNOSIS — F17210 Nicotine dependence, cigarettes, uncomplicated: Secondary | ICD-10-CM | POA: Diagnosis present

## 2016-01-13 DIAGNOSIS — E1122 Type 2 diabetes mellitus with diabetic chronic kidney disease: Secondary | ICD-10-CM | POA: Diagnosis not present

## 2016-01-13 DIAGNOSIS — E119 Type 2 diabetes mellitus without complications: Secondary | ICD-10-CM | POA: Diagnosis present

## 2016-01-13 DIAGNOSIS — Z794 Long term (current) use of insulin: Secondary | ICD-10-CM | POA: Diagnosis not present

## 2016-01-13 DIAGNOSIS — E872 Acidosis, unspecified: Secondary | ICD-10-CM | POA: Diagnosis present

## 2016-01-13 DIAGNOSIS — R0789 Other chest pain: Secondary | ICD-10-CM | POA: Diagnosis present

## 2016-01-13 DIAGNOSIS — I69331 Monoplegia of upper limb following cerebral infarction affecting right dominant side: Secondary | ICD-10-CM | POA: Diagnosis not present

## 2016-01-13 DIAGNOSIS — E875 Hyperkalemia: Secondary | ICD-10-CM | POA: Diagnosis not present

## 2016-01-13 DIAGNOSIS — Z79899 Other long term (current) drug therapy: Secondary | ICD-10-CM

## 2016-01-13 LAB — CBC
HEMATOCRIT: 34.4 % — AB (ref 36.0–46.0)
Hemoglobin: 11.6 g/dL — ABNORMAL LOW (ref 12.0–15.0)
MCH: 32.9 pg (ref 26.0–34.0)
MCHC: 33.7 g/dL (ref 30.0–36.0)
MCV: 97.5 fL (ref 78.0–100.0)
Platelets: 198 10*3/uL (ref 150–400)
RBC: 3.53 MIL/uL — ABNORMAL LOW (ref 3.87–5.11)
RDW: 14.2 % (ref 11.5–15.5)
WBC: 2.9 10*3/uL — AB (ref 4.0–10.5)

## 2016-01-13 LAB — BLOOD GAS, VENOUS
ACID-BASE DEFICIT: 9.6 mmol/L — AB (ref 0.0–2.0)
BICARBONATE: 16.6 mmol/L — AB (ref 20.0–28.0)
O2 Saturation: 90.9 %
PCO2 VEN: 41.4 mmHg — AB (ref 44.0–60.0)
PH VEN: 7.228 — AB (ref 7.250–7.430)
Patient temperature: 98.6
pO2, Ven: 71.6 mmHg — ABNORMAL HIGH (ref 32.0–45.0)

## 2016-01-13 LAB — CBC WITH DIFFERENTIAL/PLATELET
Basophils Absolute: 0 10*3/uL (ref 0.0–0.1)
Basophils Relative: 1 %
EOS PCT: 2 %
Eosinophils Absolute: 0.1 10*3/uL (ref 0.0–0.7)
HEMATOCRIT: 41.6 % (ref 36.0–46.0)
HEMOGLOBIN: 14.3 g/dL (ref 12.0–15.0)
LYMPHS ABS: 1 10*3/uL (ref 0.7–4.0)
LYMPHS PCT: 29 %
MCH: 33.1 pg (ref 26.0–34.0)
MCHC: 34.4 g/dL (ref 30.0–36.0)
MCV: 96.3 fL (ref 78.0–100.0)
Monocytes Absolute: 0.2 10*3/uL (ref 0.1–1.0)
Monocytes Relative: 6 %
NEUTROS ABS: 2.2 10*3/uL (ref 1.7–7.7)
Neutrophils Relative %: 62 %
PLATELETS: 228 10*3/uL (ref 150–400)
RBC: 4.32 MIL/uL (ref 3.87–5.11)
RDW: 13.7 % (ref 11.5–15.5)
WBC: 3.5 10*3/uL — AB (ref 4.0–10.5)

## 2016-01-13 LAB — BASIC METABOLIC PANEL
ANION GAP: 9 (ref 5–15)
ANION GAP: 9 (ref 5–15)
Anion gap: 9 (ref 5–15)
Anion gap: 8 (ref 5–15)
BUN: 63 mg/dL — AB (ref 6–20)
BUN: 63 mg/dL — ABNORMAL HIGH (ref 6–20)
BUN: 61 mg/dL — ABNORMAL HIGH (ref 6–20)
BUN: 58 mg/dL — AB (ref 6–20)
CALCIUM: 8.2 mg/dL — AB (ref 8.9–10.3)
CALCIUM: 8.2 mg/dL — AB (ref 8.9–10.3)
CHLORIDE: 108 mmol/L (ref 101–111)
CHLORIDE: 110 mmol/L (ref 101–111)
CO2: 16 mmol/L — ABNORMAL LOW (ref 22–32)
CO2: 16 mmol/L — ABNORMAL LOW (ref 22–32)
CO2: 16 mmol/L — ABNORMAL LOW (ref 22–32)
CO2: 15 mmol/L — ABNORMAL LOW (ref 22–32)
Calcium: 8.8 mg/dL — ABNORMAL LOW (ref 8.9–10.3)
Calcium: 7.6 mg/dL — ABNORMAL LOW (ref 8.9–10.3)
Chloride: 107 mmol/L (ref 101–111)
Chloride: 109 mmol/L (ref 101–111)
Creatinine, Ser: 1.51 mg/dL — ABNORMAL HIGH (ref 0.44–1.00)
Creatinine, Ser: 1.52 mg/dL — ABNORMAL HIGH (ref 0.44–1.00)
Creatinine, Ser: 1.49 mg/dL — ABNORMAL HIGH (ref 0.44–1.00)
Creatinine, Ser: 1.55 mg/dL — ABNORMAL HIGH (ref 0.44–1.00)
GFR calc Af Amer: 36 mL/min — ABNORMAL LOW (ref >60)
GFR calc Af Amer: 35 mL/min — ABNORMAL LOW (ref >60)
GFR calc non Af Amer: 31 mL/min — ABNORMAL LOW (ref >60)
GFR calc non Af Amer: 30 mL/min — ABNORMAL LOW (ref >60)
GFR, EST AFRICAN AMERICAN: 36 mL/min — AB (ref >60)
GFR, EST AFRICAN AMERICAN: 37 mL/min — AB (ref >60)
GFR, EST NON AFRICAN AMERICAN: 31 mL/min — AB (ref >60)
GFR, EST NON AFRICAN AMERICAN: 32 mL/min — AB (ref >60)
GLUCOSE: 341 mg/dL — AB (ref 65–99)
Glucose, Bld: 226 mg/dL — ABNORMAL HIGH (ref 65–99)
Glucose, Bld: 199 mg/dL — ABNORMAL HIGH (ref 65–99)
Glucose, Bld: 284 mg/dL — ABNORMAL HIGH (ref 65–99)
POTASSIUM: 5.6 mmol/L — AB (ref 3.5–5.1)
POTASSIUM: 5.5 mmol/L — AB (ref 3.5–5.1)
POTASSIUM: 5.9 mmol/L — AB (ref 3.5–5.1)
POTASSIUM: 6.3 mmol/L — AB (ref 3.5–5.1)
SODIUM: 133 mmol/L — AB (ref 135–145)
SODIUM: 132 mmol/L — AB (ref 135–145)
SODIUM: 134 mmol/L — AB (ref 135–145)
Sodium: 133 mmol/L — ABNORMAL LOW (ref 135–145)

## 2016-01-13 LAB — I-STAT TROPONIN, ED: Troponin i, poc: 0.01 ng/mL (ref 0.00–0.08)

## 2016-01-13 LAB — TROPONIN I: Troponin I: 0.03 ng/mL (ref <0.03)

## 2016-01-13 LAB — POTASSIUM: Potassium: 5.7 mmol/L — ABNORMAL HIGH (ref 3.5–5.1)

## 2016-01-13 LAB — GLUCOSE, CAPILLARY: Glucose-Capillary: 340 mg/dL — ABNORMAL HIGH (ref 65–99)

## 2016-01-13 MED ORDER — SODIUM CHLORIDE 0.9% FLUSH
3.0000 mL | Freq: Two times a day (BID) | INTRAVENOUS | Status: DC
  Administered 2016-01-13: 3 mL via INTRAVENOUS

## 2016-01-13 MED ORDER — ONDANSETRON HCL 4 MG PO TABS: 4.0000 mg | ORAL_TABLET | Freq: Four times a day (QID) | ORAL | Status: DC | PRN

## 2016-01-13 MED ORDER — SODIUM CHLORIDE 0.9 % IV BOLUS (SEPSIS)
500.0000 mL | Freq: Once | INTRAVENOUS | Status: AC
  Administered 2016-01-13: 500 mL via INTRAVENOUS

## 2016-01-13 MED ORDER — ISOSORBIDE MONONITRATE ER 60 MG PO TB24
120.0000 mg | ORAL_TABLET | Freq: Every day | ORAL | Status: DC
  Administered 2016-01-14 – 2016-01-15 (×2): 120 mg via ORAL
  Filled 2016-01-13 (×2): qty 2

## 2016-01-13 MED ORDER — HYDROCODONE-ACETAMINOPHEN 5-325 MG PO TABS
1.0000 | ORAL_TABLET | Freq: Once | ORAL | Status: AC
  Administered 2016-01-13: 1 via ORAL
  Filled 2016-01-13: qty 1

## 2016-01-13 MED ORDER — LINAGLIPTIN 5 MG PO TABS: 5.0000 mg | ORAL_TABLET | Freq: Every day | ORAL | Status: DC

## 2016-01-13 MED ORDER — INSULIN ASPART PROT & ASPART (70-30 MIX) 100 UNIT/ML ~~LOC~~ SUSP
5.0000 [IU] | Freq: Two times a day (BID) | SUBCUTANEOUS | Status: DC
  Filled 2016-01-13: qty 10

## 2016-01-13 MED ORDER — METOPROLOL TARTRATE 25 MG PO TABS
25.0000 mg | ORAL_TABLET | Freq: Two times a day (BID) | ORAL | Status: DC
Start: 2016-01-13 — End: 2016-01-15
  Administered 2016-01-13 – 2016-01-15 (×4): 25 mg via ORAL
  Filled 2016-01-13 (×4): qty 1

## 2016-01-13 MED ORDER — ONDANSETRON HCL 4 MG/2ML IJ SOLN: 4.0000 mg | Freq: Four times a day (QID) | INTRAMUSCULAR | Status: DC | PRN

## 2016-01-13 MED ORDER — MAGNESIUM GLUCONATE 500 (27 MG) MG PO TABS
250.0000 mg | ORAL_TABLET | Freq: Two times a day (BID) | ORAL | Status: DC
  Administered 2016-01-14 – 2016-01-15 (×3): 250 mg via ORAL
  Filled 2016-01-13 (×3): qty 1

## 2016-01-13 MED ORDER — ACETAMINOPHEN 325 MG PO TABS: 650.0000 mg | ORAL_TABLET | ORAL | Status: DC | PRN

## 2016-01-13 MED ORDER — FUROSEMIDE 10 MG/ML IJ SOLN
40.0000 mg | Freq: Once | INTRAMUSCULAR | Status: AC
  Administered 2016-01-13: 40 mg via INTRAVENOUS
  Filled 2016-01-13: qty 4

## 2016-01-13 MED ORDER — SODIUM CHLORIDE 0.9 % IV SOLN
INTRAVENOUS | Status: DC
  Administered 2016-01-13: 22:00:00 via INTRAVENOUS

## 2016-01-13 MED ORDER — ASPIRIN EC 81 MG PO TBEC
81.0000 mg | DELAYED_RELEASE_TABLET | Freq: Every day | ORAL | Status: DC
  Administered 2016-01-14 – 2016-01-15 (×2): 81 mg via ORAL
  Filled 2016-01-13 (×2): qty 1

## 2016-01-13 MED ORDER — HYDRALAZINE HCL 50 MG PO TABS
100.0000 mg | ORAL_TABLET | Freq: Four times a day (QID) | ORAL | Status: DC
  Administered 2016-01-13 – 2016-01-15 (×6): 100 mg via ORAL
  Filled 2016-01-13 (×6): qty 2

## 2016-01-13 MED ORDER — SODIUM POLYSTYRENE SULFONATE 15 GM/60ML PO SUSP
15.0000 g | Freq: Once | ORAL | Status: AC
  Administered 2016-01-13: 15 g via ORAL
  Filled 2016-01-13: qty 60

## 2016-01-13 MED ORDER — INSULIN ASPART PROT & ASPART (70-30 MIX) 100 UNIT/ML ~~LOC~~ SUSP
5.0000 [IU] | Freq: Two times a day (BID) | SUBCUTANEOUS | Status: DC
  Administered 2016-01-13 – 2016-01-15 (×4): 5 [IU] via SUBCUTANEOUS
  Filled 2016-01-13: qty 10

## 2016-01-13 MED ORDER — ONDANSETRON 4 MG PO TBDP
4.0000 mg | ORAL_TABLET | Freq: Once | ORAL | Status: AC
  Administered 2016-01-13: 4 mg via ORAL
  Filled 2016-01-13: qty 1

## 2016-01-13 MED ORDER — CLOPIDOGREL BISULFATE 75 MG PO TABS
75.0000 mg | ORAL_TABLET | Freq: Every day | ORAL | Status: DC
  Administered 2016-01-14 – 2016-01-15 (×2): 75 mg via ORAL
  Filled 2016-01-13 (×2): qty 1

## 2016-01-13 MED ORDER — ENOXAPARIN SODIUM 30 MG/0.3ML ~~LOC~~ SOLN
30.0000 mg | SUBCUTANEOUS | Status: DC
  Administered 2016-01-13: 30 mg via SUBCUTANEOUS
  Filled 2016-01-13: qty 0.3

## 2016-01-13 MED ORDER — INSULIN ASPART 100 UNIT/ML ~~LOC~~ SOLN
0.0000 [IU] | Freq: Three times a day (TID) | SUBCUTANEOUS | Status: DC
  Administered 2016-01-14: 2 [IU] via SUBCUTANEOUS
  Administered 2016-01-14: 1 [IU] via SUBCUTANEOUS
  Administered 2016-01-14: 3 [IU] via SUBCUTANEOUS

## 2016-01-13 NOTE — ED Notes (Signed)
Attempted report x 1 to 2 [place].

## 2016-01-13 NOTE — ED Notes (Signed)
Hospitalist at bedside 

## 2016-01-13 NOTE — ED Provider Notes (Signed)
82 year old female who came today complaining of chest pain. It was left-sided and tender around her pacemaker. She was seen and evaluated by Dr. Rex Kras and Dr. Einar Gip for this. Felt to be noncardiac in origin. Incidentally she was found to be hyperkalemic. This was rechecked multiple times. She was recently started on potassium. EKG reveals paced rhythm without QRS widening although some mild peaking of her T waves is noted this appears unchanged from EKG of 2002. Plan observation, fluids, lasix, kayexalate.  Discussed with patient and will consult hospitalist.  Discussed with Dr. Olevia Bowens and he will admit   Pattricia Boss, MD 01/13/16 [phone]

## 2016-01-13 NOTE — H&P (Signed)
History and Physical    Carmen [name] [id number] DOB: 03/05/1934 DOA: 01/13/2016  PCP: Maximino Greenland, MD   Patient coming from: Home.  Chief Complaint: Chest Pain.  HPI: Carmen [name] is a 82 y.o. female with medical history significant of anxiety, depression, osteoarthritis, cataracts, chronic lower back pain, peripheral vascular disease, coronary artery disease, pacemaker placement, history of CVA, renovascular hypertension, insulin requiring diabetes mellitus, migraine headaches who came to the emergency department due to chest pain.  Her patient's daughter, earlier today she had midsternal chest pain which radiated to her right shoulder with mild dyspnea. She also had an episode of emesis at home and another one in the ER. She denies abdominal pain, diarrhea, melena or hematochezia. She denies GU symptoms.  Incidentally she was found to have hyperkalemia with normal anion gap acidosis. Per her daughter [name], she recently was restarted on potassium supplementation and has had previous trouble with hypo and hyperkalemia.    ED Course: The patient received NS 1000 mL bolus and furosemide 40 mg ivp x1. WBC was 3.5, hb 14.3. Her potassium level has fluctuated from 5.5 to 6.3 mmol/L. Her BUN was 63 and creatinine 1.51 mg/dL. Her chest radiograph showed borderline cardiomegaly and linear atelectasis.  Review of Systems: As per HPI otherwise 10 point review of systems negative.    Past Medical History:  Diagnosis Date  . Anxiety   . Arthritis    "everywhere"  . Cataract    "both eyes; blood pressure's always too high to have them fixed" (09/11/2014)  . Chronic lower back pain   . Claudication in peripheral vascular disease ([place])    02/16/10: Left CIA 9.0x28 Omnilink and REIA 8.0x40 seff expanding [place].   right subclavian artery stent 03/18/2008,  . Coronary artery disease   . Herniated lumbar intervertebral disc   . Hypertension   . IDDM (insulin dependent diabetes mellitus)  ([place])   . Migraine    "used to have terrible migraines; stopped in the 1990's"  . Pacemaker   . Peripheral vascular disease ([place])   . Pneumonia X 2  . Renal disorder   . Renovascular hypertension     s/p left renal artery stent 12/2007.  S/P balloon angioplasty on 02/16/10 for ISR, BP was  controlled well since then.     . Stroke [facility]) 1999   Stroke and TIA in 1999 with right sided weakness, now with residual right arm weakness (09/11/2014)  . UTI (lower urinary tract infection) 02/2015    Past Surgical History:  Procedure Laterality Date  . ABDOMINAL ANGIOGRAM N/A 07/06/2011   Procedure: ABDOMINAL ANGIOGRAM;  Surgeon: Laverda Page, MD;  Location: [facility] CATH LAB;  Service: Cardiovascular;  Laterality: N/A;  . APPENDECTOMY    . BACK SURGERY    . CAROTID ENDARTERECTOMY Left 1991    Stroke and TIA in 1999 with right sided weakness, now with residual right arm weakness  . CARPAL TUNNEL RELEASE Left   . COLONOSCOPY  07/30/2011   Procedure: COLONOSCOPY;  Surgeon: Inda Castle, MD;  Location: [facility];  Service: Endoscopy;  Laterality: N/A;  gi bleed  . ESOPHAGOGASTRODUODENOSCOPY  07/30/2011   Procedure: ESOPHAGOGASTRODUODENOSCOPY (EGD);  Surgeon: Inda Castle, MD;  Location: [place];  Service: Endoscopy;  Laterality: N/A;  . EXCISIONAL HEMORRHOIDECTOMY    . INSERT / REPLACE / REMOVE PACEMAKER    . LOWER EXTREMITY ANGIOGRAM Right 05/29/2012   Procedure: LOWER EXTREMITY ANGIOGRAM;  Surgeon: Laverda Page, MD;  Location: [facility] CATH LAB;  Service: Cardiovascular;  Laterality: Right;  . LUMBAR LAMINECTOMY     'herniated disc"  . PERIPHERAL VASCULAR CATHETERIZATION N/A 11/05/2014   Procedure: Abdominal Aortogram;  Surgeon: Serafina Mitchell, MD;  Location: [facility] CV LAB;  Service: Cardiovascular;  Laterality: N/A;  . PERMANENT PACEMAKER INSERTION Left 06/28/2011   Procedure: PERMANENT PACEMAKER INSERTION;  Surgeon: Deboraha Sprang, MD;  Location[facility] CATH LAB;  Service:  Cardiovascular;  Laterality: Left;  . RENAL ANGIOGRAM N/A 07/06/2011   Procedure: RENAL ANGIOGRAM;  Surgeon: Laverda Page, MD;  Location: [facility] CATH LAB;  Service: Cardiovascular;  Laterality: N/A;  . TONSILLECTOMY    . URETERAL STENT PLACEMENT    . VAGINAL HYSTERECTOMY       reports that she has been smoking Cigarettes.  She has been smoking about 0.00 packs per day for the past 70.00 years. She has never used smokeless tobacco. She reports that she does not drink alcohol or use drugs.  Allergies  Allergen Reactions  . Norvasc [Amlodipine Besylate] Swelling  . Peanut-Containing Drug Products Other (See Comments)    Stomach pain    Family History  Problem Relation Age of Onset  . Cancer Father     Colon cancer  . Heart attack Mother     Prior to Admission medications   Medication Sig Start Date End Date Taking? Authorizing Provider  aspirin EC 81 MG tablet Take 81 mg by mouth daily.   Yes Historical Provider, MD  clopidogrel (PLAVIX) 75 MG tablet Take 75 mg by mouth daily.   Yes Historical Provider, MD  furosemide (LASIX) 20 MG tablet Take 20 mg by mouth 2 (two) times daily.   Yes Historical Provider, MD  hydrALAZINE (APRESOLINE) 100 MG tablet Take 1 tablet (100 mg total) by mouth 4 (four) times daily. 10/30/15  Yes Janece Canterbury, MD  insulin NPH-regular Human (NOVOLIN 70/30) (70-30) 100 UNIT/ML injection Inject 5 Units into the skin 2 (two) times daily. 10/30/15  Yes Janece Canterbury, MD  isosorbide mononitrate (IMDUR) 120 MG 24 hr tablet Take 1 tablet (120 mg total) by mouth daily. 12/12/12  Yes Annita Brod, MD  linagliptin (TRADJENTA) 5 MG TABS tablet Take 1 tablet (5 mg total) by mouth daily. 12/01/14  Yes Lauree Chandler, NP  metoprolol tartrate (LOPRESSOR) 25 MG tablet Take 25 mg by mouth 2 (two) times daily.   Yes Historical Provider, MD  COD LIVER OIL PO Take 1 tablet by mouth daily.    Historical Provider, MD  feeding supplement, GLUCERNA SHAKE, (GLUCERNA SHAKE) LIQD  Take 237 mLs by mouth 2 (two) times daily between meals. 01/16/15   Belkys A Regalado, MD  Garlic [id number] MG CAPS Take by mouth.    Historical Provider, MD  magnesium gluconate (MAGONATE) 30 MG tablet Take 1 tablet (30 mg total) by mouth 2 (two) times daily. 10/30/15   Janece Canterbury, MD  Omega-3 Fatty Acids (FISH OIL) 1000 MG CAPS Take 1,000 mg by mouth 2 (two) times daily.     Historical Provider, MD  potassium chloride 20 MEQ TBCR Take 40 mEq by mouth daily. 10/30/15   Janece Canterbury, MD    Physical Exam:  Constitutional: NAD, calm, comfortable Vitals:   01/13/16 1845 01/13/16 1900 01/13/16 1915 01/13/16 1930  BP: (!) 167/53 153/57 (!) 162/53 (!) 145/52  Pulse: 61 (!) 59 60 63  Resp: 15 18 16 15   SpO2: 94% 95% 92% 93%   Eyes: PERRL, lids and conjunctivae normal ENMT: Mucous membranes are moist. Posterior  pharynx clear of any exudate or lesions. Neck: normal, supple, no masses, no thyromegaly Respiratory: clear to auscultation bilaterally, no wheezing, no crackles. Normal respiratory effort. No accessory muscle use.  Cardiovascular: Regular rate and rhythm, no murmurs / rubs / gallops. No extremity edema. 2+ pedal pulses. No carotid bruits.  Abdomen: Soft, no tenderness, no masses palpated. No hepatosplenomegaly. Bowel sounds positive.  Musculoskeletal: no clubbing / cyanosis. Good ROM, no contractures. Normal muscle tone.  Skin: Multiple hyperpigmented macules and plaques on face, torso and extremities. Neurologic: CN 2-12 grossly intact. Sensation intact, DTR normal. Strength 5/5 in all 4.  Psychiatric: Alert and oriented x 2. Depressed mood.   Labs on Admission: I have personally reviewed following labs and imaging studies  CBC:  Recent Labs Lab 01/13/16 1255  WBC 3.5*  NEUTROABS 2.2  HGB 14.3  HCT 41.6  MCV 96.3  PLT [id number]   Basic Metabolic Panel:  Recent Labs Lab 01/13/16 1255 01/13/16 1534 01/13/16 1716  NA 133* 132* 134*  K 5.6* 5.5*  5.7* 5.9*  CL 108 107 109    CO2 16* 16* 16*  GLUCOSE 226* 199* 284*  BUN 63* 63* 61*  CREATININE 1.51* 1.52* 1.49*  CALCIUM 8.8* 8.2* 8.2*   GFR: CrCl cannot be calculated (Unknown ideal weight.). Liver Function Tests: No results for input(s): AST, ALT, ALKPHOS, BILITOT, PROT, ALBUMIN in the last 168 hours. No results for input(s): LIPASE, AMYLASE in the last 168 hours. No results for input(s): AMMONIA in the last 168 hours. Coagulation Profile: No results for input(s): INR, PROTIME in the last 168 hours. Cardiac Enzymes:  Recent Labs Lab 01/13/16 1534  TROPONINI <0.03   BNP (last 3 results) No results for input(s): PROBNP in the last 8760 hours. HbA1C: No results for input(s): HGBA1C in the last 72 hours. CBG: No results for input(s): GLUCAP in the last 168 hours. Lipid Profile: No results for input(s): CHOL, HDL, LDLCALC, TRIG, CHOLHDL, LDLDIRECT in the last 72 hours. Thyroid Function Tests: No results for input(s): TSH, T4TOTAL, FREET4, T3FREE, THYROIDAB in the last 72 hours. Anemia Panel: No results for input(s): VITAMINB12, FOLATE, FERRITIN, TIBC, IRON, RETICCTPCT in the last 72 hours. Urine analysis:    Component Value Date/Time   COLORURINE YELLOW 10/29/2015 0006   APPEARANCEUR CLOUDY (A) 10/29/2015 0006   LABSPEC 1.015 10/29/2015 0006   PHURINE 5.5 10/29/2015 0006   GLUCOSEU 100 (A) 10/29/2015 0006   HGBUR NEGATIVE 10/29/2015 0006   BILIRUBINUR NEGATIVE 10/29/2015 0006   KETONESUR NEGATIVE 10/29/2015 0006   PROTEINUR >300 (A) 10/29/2015 0006   UROBILINOGEN 0.2 01/13/2015 2034   NITRITE NEGATIVE 10/29/2015 0006   LEUKOCYTESUR NEGATIVE 10/29/2015 0006    Radiological Exams on Admission: Dg Chest 2 View  Result Date: 01/13/2016 CLINICAL DATA:  Mid sternal chest pain into the right shoulder EXAM: CHEST  2 VIEW COMPARISON:  10/28/2015 FINDINGS: Left-sided dual lead pacemaker with leads over the right atrium and right ventricle. Linear atelectasis or scar at the right base. No acute  infiltrate or effusion. Stable cardiomediastinal silhouette with atherosclerosis. No pneumothorax. Marked degenerative changes of the right shoulder. Vascular stent over the right upper chest. IMPRESSION: 1. Linear atelectasis or scar at the right base. No acute infiltrate or edema 2. Borderline cardiomegaly 3. Atherosclerosis of the aorta Electronically Signed   By: Donavan Foil M.D.   On: 01/13/2016 14:08    EKG: Independently reviewed. Vent. rate 60 BPM PR interval * ms QRS duration 104 ms QT/QTc 467/467 ms P-R-T axes *  59 168 Atrial-paced rhythm LVH with secondary repolarization abnormality Anterior infarct, old  Assessment/Plan Principal Problem:   Hyperkalemia   Normal anion gap metabolic acidosis Admit to telemetry/observation. Discontinue potassium supplements. Received furosemide 40 mg IVP and Kayexalate 15 g orally in the emergency department. Continue IV fluids. Check venous blood gas. Check urine analysis, urine osmolality, sodium, potassium. Monitor serum potassium level. Consider nephrology evaluation in AM.  Active Problems:   Chest pain Likely noncardiac. Seen by cardiology earlier who felt also to be of noncardiac origin. Cardiac monitoring. Trend troponin levels.    DM (diabetes mellitus), type 2, uncontrolled, periph vascular complic (HCC) Carbohydrate modified diet. Continue try again to 5 mg by mouth daily. Continue 70/30 insulin. CBG monitoring regular insulin sliding scale.    CKD (chronic kidney disease), stage III Monitor BUN, creatinine and electrolytes.    HTN (hypertension) Continue hydralazine 100 mg by mouth 3 times a day. Continue metoprolol 25 mg by mouth twice a day.      History of CAD. Continue metoprolol, aspirin, Plavix and Imdur..         DVT prophylaxis: Lovenox SQ. Code Status: Full code. Family Communication: Her daughters [name] and Hassan Rowan were present in the room. Disposition Plan: Admit for overnight observation  and hyperkalemia treatment. Consults called:  Admission status: Observation/telemetry.   Reubin Milan MD [facility] Pager [phone].  If 7PM-7AM, please contact night-coverage [email or web address] Password [facility]  01/13/2016, 8:34 PM    Addendum: Received kayexalate 30 gr on the floor around 12:30 AM. Around 3:20 AM transferred to SDU for closer monitoring.

## 2016-01-13 NOTE — ED Triage Notes (Addendum)
Per EMS: pt from home with care givers c/o mid sternal CP into right shoulder that is now resolved; pt with pacemaker and sts pain in area where pacemaker was implanted; pt with chronic right shoulder and arm pain since having stroke; pt vomited x 1 this am and was given 325mg  ASA with morning meds

## 2016-01-13 NOTE — ED Notes (Signed)
Patient undressed, in gown, on monitor, continuous pulse oximetry and blood pressure cuff; warm blankets given; visitor at bedside

## 2016-01-13 NOTE — ED Notes (Signed)
Pt's husband, Daffney Coll would like to be notified of changes and status.

## 2016-01-13 NOTE — ED Notes (Signed)
This RN attempted IV access x1 unsuccessfully. Able to draw labs off line. Patient's family member does not want me to attempt again.

## 2016-01-13 NOTE — ED Notes (Signed)
IV team at bedside 

## 2016-01-13 NOTE — ED Provider Notes (Signed)
[facility] DEPT Provider Note   CSN: [id number] Arrival date & time: 01/13/16  1221     History   Chief Complaint Chief Complaint  Patient presents with  . Chest Pain    HPI Carmen [name] is a 82 y.o. female.  82yo F w/ PMH including CAD, PVD, IDDM, HTN who p/w chest pain. Earlier today after the patient ate lunch, she began having left-sided chest pain around her area of her pacemaker. She states that the pain is worse with any movement and better when she is sitting still. The pain lasted a short period of time and spontaneously resolved. During this time she did have an episode of vomiting. she denies any shortness of breath. Currently her chest hurts only when you touch it around the pacemaker. No trauma to that area. She reports associated R shoulder and arm pain that is chronic since previous stroke. She has already had 325 mg aspirin today. No abdominal pain or urinary symptoms. No cough/cold symptoms.   The history is provided by the patient.    Past Medical History:  Diagnosis Date  . Anxiety   . Arthritis    "everywhere"  . Cataract    "both eyes; blood pressure's always too high to have them fixed" (09/11/2014)  . Chronic lower back pain   . Claudication in peripheral vascular disease ([place])    02/16/10: Left CIA 9.0x28 Omnilink and REIA 8.0x40 seff expanding Zilver.   right subclavian artery stent 03/18/2008,  . Coronary artery disease   . Herniated lumbar intervertebral disc   . Hypertension   . IDDM (insulin dependent diabetes mellitus) ([place])   . Migraine    "used to have terrible migraines; stopped in the 1990's"  . Pacemaker   . Peripheral vascular disease ([place])   . Pneumonia X 2  . Renal disorder   . Renovascular hypertension     s/p left renal artery stent 12/2007.  S/P balloon angioplasty on 02/16/10 for ISR, BP was  controlled well since then.     . Stroke [facility]) 1999   Stroke and TIA in 1999 with right sided weakness, now with residual right arm  weakness (09/11/2014)  . UTI (lower urinary tract infection) 02/2015    Patient Active Problem List   Diagnosis Date Noted  . Hypoglycemia 10/28/2015  . DNR (do not resuscitate) discussion 02/27/2015  . Palliative care encounter 02/27/2015  . Acute on chronic kidney failure ([place])   . Constipation   . Dehydration, moderate 02/26/2015  . Failure to thrive in adult 02/26/2015  . Acute on chronic renal failure ([place]) 02/26/2015  . Acute cystitis without hematuria   . AKI (acute kidney injury) ([place]) 01/14/2015  . Metabolic acidosis [id number]  . Urinary retention 11/02/2014  . Lower limb ischemia; LLE 11/02/2014  . Hyperkalemia 11/02/2014  . Acute renal failure syndrome ([place])   . Hematuria   . Acute encephalopathy   . Altered mental status 10/28/2014  . Confusion 10/28/2014  . Pulmonary edema 09/11/2014  . Acute respiratory failure with hypoxia ([place]) 09/11/2014  . Acute diastolic heart failure ([place]) 09/11/2014  . Protein-calorie malnutrition, severe ([place]) 08/06/2013  . Hypertensive crisis 08/05/2013  . Hypertensive urgency 08/03/2013  . Weakness generalized 04/06/2013  . Dehydration 04/06/2013  . Transaminitis 04/06/2013  . Type 2 diabetes mellitus with hyperosmolar nonketotic hyperglycemia ([place]) 04/05/2013  . CKD (chronic kidney disease), stage III 04/05/2013  . HTN (hypertension) 04/05/2013  . Dyslipidemia 04/05/2013  . Abnormal LFTs 04/05/2013  . CVA (cerebral infarction)  10/15/2011  . [employer] 10/11/2011  . History of CVA (cerebrovascular accident) with associated mild right upper extremity hemiplegia 07/30/2011  . Retrovascular hypertension status post left renal artery stent 2009 and balloon angioplasty for in-stent restenosis 2011 04/05/2011  . Hypokalemia 03/28/2011  . Tobacco abuse 03/28/2011  . DM (diabetes mellitus), type 2, uncontrolled, periph vascular complic ([place]) [id number]  . Peripheral neuropathy ([place]) 03/28/2011    Past Surgical History:    Procedure Laterality Date  . ABDOMINAL ANGIOGRAM N/A 07/06/2011   Procedure: ABDOMINAL ANGIOGRAM;  Surgeon: Laverda Page, MD;  Location: [facility] CATH LAB;  Service: Cardiovascular;  Laterality: N/A;  . APPENDECTOMY    . BACK SURGERY    . CAROTID ENDARTERECTOMY Left 1991    Stroke and TIA in 1999 with right sided weakness, now with residual right arm weakness  . CARPAL TUNNEL RELEASE Left   . COLONOSCOPY  07/30/2011   Procedure: COLONOSCOPY;  Surgeon: Inda Castle, MD;  Location: [facility];  Service: Endoscopy;  Laterality: N/A;  gi bleed  . ESOPHAGOGASTRODUODENOSCOPY  07/30/2011   Procedure: ESOPHAGOGASTRODUODENOSCOPY (EGD);  Surgeon: Inda Castle, MD;  Location: [facility];  Service: Endoscopy;  Laterality: N/A;  . EXCISIONAL HEMORRHOIDECTOMY    . INSERT / REPLACE / REMOVE PACEMAKER    . LOWER EXTREMITY ANGIOGRAM Right 05/29/2012   Procedure: LOWER EXTREMITY ANGIOGRAM;  Surgeon: Laverda Page, MD;  Location: [facility] CATH LAB;  Service: Cardiovascular;  Laterality: Right;  . LUMBAR LAMINECTOMY     'herniated disc"  . PERIPHERAL VASCULAR CATHETERIZATION N/A 11/05/2014   Procedure: Abdominal Aortogram;  Surgeon: Serafina Mitchell, MD;  Location: [facility] CV LAB;  Service: Cardiovascular;  Laterality: N/A;  . PERMANENT PACEMAKER INSERTION Left 06/28/2011   Procedure: PERMANENT PACEMAKER INSERTION;  Surgeon: Deboraha Sprang, MD;  Location: [facility] CATH LAB;  Service: Cardiovascular;  Laterality: Left;  . RENAL ANGIOGRAM N/A 07/06/2011   Procedure: RENAL ANGIOGRAM;  Surgeon: Laverda Page, MD;  Location: [facility] CATH LAB;  Service: Cardiovascular;  Laterality: N/A;  . TONSILLECTOMY    . URETERAL STENT PLACEMENT    . VAGINAL HYSTERECTOMY      OB History    No data available       Home Medications    Prior to Admission medications   Medication Sig Start Date End Date Taking? Authorizing Provider  aspirin EC 81 MG tablet Take 81 mg by mouth daily.   Yes Historical Provider, MD   clopidogrel (PLAVIX) 75 MG tablet Take 75 mg by mouth daily.   Yes Historical Provider, MD  furosemide (LASIX) 20 MG tablet Take 20 mg by mouth 2 (two) times daily.   Yes Historical Provider, MD  hydrALAZINE (APRESOLINE) 100 MG tablet Take 1 tablet (100 mg total) by mouth 4 (four) times daily. 10/30/15  Yes Janece Canterbury, MD  insulin NPH-regular Human (NOVOLIN 70/30) (70-30) 100 UNIT/ML injection Inject 5 Units into the skin 2 (two) times daily. 10/30/15  Yes Janece Canterbury, MD  isosorbide mononitrate (IMDUR) 120 MG 24 hr tablet Take 1 tablet (120 mg total) by mouth daily. 12/12/12  Yes Annita Brod, MD  linagliptin (TRADJENTA) 5 MG TABS tablet Take 1 tablet (5 mg total) by mouth daily. 12/01/14  Yes Lauree Chandler, NP  metoprolol tartrate (LOPRESSOR) 25 MG tablet Take 25 mg by mouth 2 (two) times daily.   Yes Historical Provider, MD  COD LIVER OIL PO Take 1 tablet by mouth daily.    Historical Provider, MD  feeding  supplement, GLUCERNA SHAKE, (GLUCERNA SHAKE) LIQD Take 237 mLs by mouth 2 (two) times daily between meals. 01/16/15   Belkys A Regalado, MD  Garlic [id number] MG CAPS Take by mouth.    Historical Provider, MD  magnesium gluconate (MAGONATE) 30 MG tablet Take 1 tablet (30 mg total) by mouth 2 (two) times daily. 10/30/15   Janece Canterbury, MD  Omega-3 Fatty Acids (FISH OIL) 1000 MG CAPS Take 1,000 mg by mouth 2 (two) times daily.     Historical Provider, MD  potassium chloride 20 MEQ TBCR Take 40 mEq by mouth daily. 10/30/15   Janece Canterbury, MD    Family History Family History  Problem Relation Age of Onset  . Cancer Father   . Heart attack Mother     Social History Social History  Substance Use Topics  . Smoking status: Current Every Day Smoker    Packs/day: 0.00    Years: 70.00    Types: Cigarettes  . Smokeless tobacco: Never Used     Comment: 09/11/2014 "I used to smoke very heavy; down to 1 cigarette/day"  . Alcohol use No     Comment: "stopped drinking back in the  1970's"     Allergies   Norvasc [amlodipine besylate] and Peanut-containing drug products   Review of Systems Review of Systems 10 Systems reviewed and are negative for acute change except as noted in the HPI.   Physical Exam Updated Vital Signs BP (!) 151/53 (BP Location: Left Arm)   Pulse 61   Resp 18   SpO2 96%   Physical Exam  Constitutional: She is oriented to person, place, and time. No distress.  Frail, elderly woman awake and alert  HENT:  Head: Normocephalic and atraumatic.  Moist mucous membranes  Eyes: Conjunctivae are normal. Pupils are equal, round, and reactive to light.  Neck: Neck supple.  Cardiovascular: Normal rate, regular rhythm and normal heart sounds.   No murmur heard. Pulmonary/Chest: Effort normal and breath sounds normal.  Pacemaker L upper chest w/ mild TTP, no erythema or swelling  Abdominal: Soft. Bowel sounds are normal. She exhibits no distension. There is no tenderness.  Musculoskeletal: She exhibits no edema.  R arm adducted and held close to body  Neurological: She is alert and oriented to person, place, and time.  Fluent speech  Skin: Skin is warm and dry. No erythema.  Psychiatric: She has a normal mood and affect. Judgment normal.  Nursing note and vitals reviewed.    ED Treatments / Results  Labs (all labs ordered are listed, but only abnormal results are displayed) Labs Reviewed  BASIC METABOLIC PANEL - Abnormal; Notable for the following:       Result Value   Sodium 133 (*)    Potassium 5.6 (*)    CO2 16 (*)    Glucose, Bld 226 (*)    BUN 63 (*)    Creatinine, Ser 1.51 (*)    Calcium 8.8 (*)    GFR calc non Af Amer 31 (*)    GFR calc Af Amer 36 (*)    All other components within normal limits  CBC WITH DIFFERENTIAL/PLATELET - Abnormal; Notable for the following:    WBC 3.5 (*)    All other components within normal limits  POTASSIUM - Abnormal; Notable for the following:    Potassium 5.7 (*)    All other components  within normal limits  TROPONIN I  BASIC METABOLIC PANEL  I-STAT TROPOININ, ED    EKG  EKG Interpretation  Date/Time:  Tuesday January 13 2016 12:32:14 EST Ventricular Rate:  60 PR Interval:    QRS Duration: 104 QT Interval:  467 QTC Calculation: 467 R Axis:   38 Text Interpretation:  Atrial-paced rhythm LVH with secondary repolarization abnormality Anterior infarct, old similar to EKG in 2016 Confirmed by Cortni Tays MD, [place] [id number]) on 01/13/2016 12:40:55 PM       Radiology Dg Chest 2 View  Result Date: 01/13/2016 CLINICAL DATA:  Mid sternal chest pain into the right shoulder EXAM: CHEST  2 VIEW COMPARISON:  10/28/2015 FINDINGS: Left-sided dual lead pacemaker with leads over the right atrium and right ventricle. Linear atelectasis or scar at the right base. No acute infiltrate or effusion. Stable cardiomediastinal silhouette with atherosclerosis. No pneumothorax. Marked degenerative changes of the right shoulder. Vascular stent over the right upper chest. IMPRESSION: 1. Linear atelectasis or scar at the right base. No acute infiltrate or edema 2. Borderline cardiomegaly 3. Atherosclerosis of the aorta Electronically Signed   By: Donavan Foil M.D.   On: 01/13/2016 14:08    Procedures Procedures (including critical care time)  Medications Ordered in ED Medications  HYDROcodone-acetaminophen (NORCO/VICODIN) 5-325 MG per tablet 1 tablet (1 tablet Oral Given 01/13/16 1456)  ondansetron (ZOFRAN-ODT) disintegrating tablet 4 mg (4 mg Oral Given 01/13/16 1550)     Initial Impression / Assessment and Plan / ED Course  I have reviewed the triage vital signs and the nursing notes.  Pertinent labs & imaging results that were available during my care of the patient were reviewed by me and considered in my medical decision making (see chart for details).  Clinical Course     Pt w/ episode of L sided CP near area of pacemaker as well as 1 episode of vomiting this morning. She was  comfortable on exam with reassuring vital signs. EKG shows paced rhythm. Mild tenderness around the pacemaker site which she states reproduces her chest pain. No signs of infection around the pacemaker. Obtained above labs as well as chest x-ray.  Labs show normal serial trops, glucose 226 w/ normal AG. Potassium has clotted twice and I have ordered repeat. CXR Negative for acute process. On reexamination, the patient is sitting up and eating soup. She has had no vomiting here and no abdominal tenderness to suggest intra-abdominal process. I contacted the patient's cardiologist, Dr. Einar Gip. He recommended discharge home if labwork reassuring and he will follow the patient in the clinic. I discussed this plan with the patient and her daughter. We will discharge home if repeat potassium is reassuring.  Final Clinical Impressions(s) / ED Diagnoses   Final diagnoses:  Chest pain, unspecified type  Non-intractable vomiting with nausea, unspecified vomiting type    New Prescriptions New Prescriptions   No medications on file     Sharlett Iles, MD 01/13/16 1650

## 2016-01-14 ENCOUNTER — Encounter (HOSPITAL_COMMUNITY): Payer: Self-pay | Admitting: *Deleted

## 2016-01-14 DIAGNOSIS — Z66 Do not resuscitate: Secondary | ICD-10-CM | POA: Diagnosis present

## 2016-01-14 DIAGNOSIS — I69331 Monoplegia of upper limb following cerebral infarction affecting right dominant side: Secondary | ICD-10-CM | POA: Diagnosis not present

## 2016-01-14 DIAGNOSIS — Z888 Allergy status to other drugs, medicaments and biological substances status: Secondary | ICD-10-CM | POA: Diagnosis not present

## 2016-01-14 DIAGNOSIS — Z950 Presence of cardiac pacemaker: Secondary | ICD-10-CM | POA: Diagnosis not present

## 2016-01-14 DIAGNOSIS — E875 Hyperkalemia: Principal | ICD-10-CM

## 2016-01-14 DIAGNOSIS — Z7982 Long term (current) use of aspirin: Secondary | ICD-10-CM | POA: Diagnosis not present

## 2016-01-14 DIAGNOSIS — E1151 Type 2 diabetes mellitus with diabetic peripheral angiopathy without gangrene: Secondary | ICD-10-CM | POA: Diagnosis present

## 2016-01-14 DIAGNOSIS — I2510 Atherosclerotic heart disease of native coronary artery without angina pectoris: Secondary | ICD-10-CM | POA: Diagnosis present

## 2016-01-14 DIAGNOSIS — Z79899 Other long term (current) drug therapy: Secondary | ICD-10-CM | POA: Diagnosis not present

## 2016-01-14 DIAGNOSIS — E872 Acidosis, unspecified: Secondary | ICD-10-CM | POA: Diagnosis present

## 2016-01-14 DIAGNOSIS — I150 Renovascular hypertension: Secondary | ICD-10-CM | POA: Diagnosis present

## 2016-01-14 DIAGNOSIS — Z91010 Allergy to peanuts: Secondary | ICD-10-CM | POA: Diagnosis not present

## 2016-01-14 DIAGNOSIS — N179 Acute kidney failure, unspecified: Secondary | ICD-10-CM | POA: Diagnosis present

## 2016-01-14 DIAGNOSIS — R079 Chest pain, unspecified: Secondary | ICD-10-CM | POA: Diagnosis present

## 2016-01-14 DIAGNOSIS — Z8249 Family history of ischemic heart disease and other diseases of the circulatory system: Secondary | ICD-10-CM | POA: Diagnosis not present

## 2016-01-14 DIAGNOSIS — N183 Chronic kidney disease, stage 3 (moderate): Secondary | ICD-10-CM | POA: Diagnosis present

## 2016-01-14 DIAGNOSIS — E1122 Type 2 diabetes mellitus with diabetic chronic kidney disease: Secondary | ICD-10-CM | POA: Diagnosis present

## 2016-01-14 DIAGNOSIS — Z7902 Long term (current) use of antithrombotics/antiplatelets: Secondary | ICD-10-CM | POA: Diagnosis not present

## 2016-01-14 DIAGNOSIS — R0789 Other chest pain: Secondary | ICD-10-CM | POA: Diagnosis present

## 2016-01-14 DIAGNOSIS — Z794 Long term (current) use of insulin: Secondary | ICD-10-CM | POA: Diagnosis not present

## 2016-01-14 DIAGNOSIS — F17210 Nicotine dependence, cigarettes, uncomplicated: Secondary | ICD-10-CM | POA: Diagnosis present

## 2016-01-14 DIAGNOSIS — E1165 Type 2 diabetes mellitus with hyperglycemia: Secondary | ICD-10-CM | POA: Diagnosis present

## 2016-01-14 DIAGNOSIS — F419 Anxiety disorder, unspecified: Secondary | ICD-10-CM | POA: Diagnosis present

## 2016-01-14 LAB — COMPREHENSIVE METABOLIC PANEL
ALBUMIN: 3.3 g/dL — AB (ref 3.5–5.0)
ALT: 59 U/L — ABNORMAL HIGH (ref 14–54)
AST: 73 U/L — AB (ref 15–41)
Alkaline Phosphatase: 69 U/L (ref 38–126)
Anion gap: 11 (ref 5–15)
BUN: 60 mg/dL — AB (ref 6–20)
CHLORIDE: 111 mmol/L (ref 101–111)
CO2: 16 mmol/L — ABNORMAL LOW (ref 22–32)
Calcium: 8 mg/dL — ABNORMAL LOW (ref 8.9–10.3)
Creatinine, Ser: 1.74 mg/dL — ABNORMAL HIGH (ref 0.44–1.00)
GFR calc Af Amer: 31 mL/min — ABNORMAL LOW (ref >60)
GFR, EST NON AFRICAN AMERICAN: 27 mL/min — AB (ref >60)
Glucose, Bld: 274 mg/dL — ABNORMAL HIGH (ref 65–99)
POTASSIUM: 5.2 mmol/L — AB (ref 3.5–5.1)
Sodium: 138 mmol/L (ref 135–145)
Total Bilirubin: 0.3 mg/dL (ref 0.3–1.2)
Total Protein: 6 g/dL — ABNORMAL LOW (ref 6.5–8.1)

## 2016-01-14 LAB — BASIC METABOLIC PANEL
Anion gap: 8 (ref 5–15)
BUN: 53 mg/dL — ABNORMAL HIGH (ref 6–20)
CALCIUM: 7.2 mg/dL — AB (ref 8.9–10.3)
CHLORIDE: 109 mmol/L (ref 101–111)
CO2: 16 mmol/L — ABNORMAL LOW (ref 22–32)
CREATININE: 1.44 mg/dL — AB (ref 0.44–1.00)
GFR, EST AFRICAN AMERICAN: 39 mL/min — AB (ref >60)
GFR, EST NON AFRICAN AMERICAN: 33 mL/min — AB (ref >60)
Glucose, Bld: 339 mg/dL — ABNORMAL HIGH (ref 65–99)
Potassium: 4.6 mmol/L (ref 3.5–5.1)
SODIUM: 133 mmol/L — AB (ref 135–145)

## 2016-01-14 LAB — GLUCOSE, CAPILLARY
GLUCOSE-CAPILLARY: 190 mg/dL — AB (ref 65–99)
GLUCOSE-CAPILLARY: 136 mg/dL — AB (ref 65–99)
Glucose-Capillary: 204 mg/dL — ABNORMAL HIGH (ref 65–99)
Glucose-Capillary: 99 mg/dL (ref 65–99)

## 2016-01-14 LAB — CBC WITH DIFFERENTIAL/PLATELET
BASOS ABS: 0 10*3/uL (ref 0.0–0.1)
BASOS PCT: 1 %
EOS PCT: 2 %
Eosinophils Absolute: 0.1 10*3/uL (ref 0.0–0.7)
HCT: 35.6 % — ABNORMAL LOW (ref 36.0–46.0)
Hemoglobin: 11.7 g/dL — ABNORMAL LOW (ref 12.0–15.0)
Lymphocytes Relative: 39 %
Lymphs Abs: 1.2 10*3/uL (ref 0.7–4.0)
MCH: 32.4 pg (ref 26.0–34.0)
MCHC: 32.9 g/dL (ref 30.0–36.0)
MCV: 98.6 fL (ref 78.0–100.0)
MONO ABS: 0.2 10*3/uL (ref 0.1–1.0)
Monocytes Relative: 6 %
Neutro Abs: 1.5 10*3/uL — ABNORMAL LOW (ref 1.7–7.7)
Neutrophils Relative %: 52 %
PLATELETS: 212 10*3/uL (ref 150–400)
RBC: 3.61 MIL/uL — ABNORMAL LOW (ref 3.87–5.11)
RDW: 14.4 % (ref 11.5–15.5)
WBC: 3 10*3/uL — ABNORMAL LOW (ref 4.0–10.5)

## 2016-01-14 LAB — TROPONIN I: Troponin I: 0.03 ng/mL (ref <0.03)

## 2016-01-14 MED ORDER — HYDROCODONE-ACETAMINOPHEN 5-325 MG PO TABS
1.0000 | ORAL_TABLET | Freq: Four times a day (QID) | ORAL | Status: DC | PRN
  Administered 2016-01-14 – 2016-01-15 (×2): 1 via ORAL
  Filled 2016-01-14 (×2): qty 1

## 2016-01-14 MED ORDER — LACTATED RINGERS IV SOLN: INTRAVENOUS | Status: DC

## 2016-01-14 MED ORDER — SODIUM BICARBONATE 650 MG PO TABS
1300.0000 mg | ORAL_TABLET | Freq: Two times a day (BID) | ORAL | Status: DC
  Administered 2016-01-14 – 2016-01-15 (×3): 1300 mg via ORAL
  Filled 2016-01-14 (×3): qty 2

## 2016-01-14 MED ORDER — SODIUM POLYSTYRENE SULFONATE 15 GM/60ML PO SUSP
30.0000 g | Freq: Once | ORAL | Status: AC
  Administered 2016-01-14: 30 g via ORAL
  Filled 2016-01-14: qty 120

## 2016-01-14 MED ORDER — SODIUM CHLORIDE 0.9 % IV SOLN
INTRAVENOUS | Status: DC
Start: 2016-01-14 — End: 2016-01-15
  Administered 2016-01-14 (×2): via INTRAVENOUS

## 2016-01-14 MED ORDER — SODIUM CHLORIDE 0.9 % IV BOLUS (SEPSIS)
500.0000 mL | Freq: Once | INTRAVENOUS | Status: AC
  Administered 2016-01-14: 500 mL via INTRAVENOUS

## 2016-01-14 NOTE — Consult Note (Signed)
   [facility] CM Inpatient Consult   01/14/2016  Carmen [name] January 23, 1934 [id number]    Patient screened for [place] Management program. Martin Majestic to bedside to discuss [place] Management services. However, therapy was at bedside. Will follow up at later time. Made inpatient RNCM aware.  Marthenia Rolling, MSN-Ed, RN,BSN [facility] Liaison [phone]

## 2016-01-14 NOTE — Progress Notes (Signed)
rn noted transfer orders for pt. [facility] hospilist on call k. schoor to clarify. Transfer orders discontinued. Pt stable resting in bed. Monitoring will continue.e

## 2016-01-14 NOTE — Progress Notes (Signed)
Inpatient Diabetes Program Recommendations  AACE/ADA: New Consensus Statement on Inpatient Glycemic Control (2015)  Target Ranges:  Prepandial:   less than 140 mg/dL      Peak postprandial:   less than 180 mg/dL (1-2 hours)      Critically ill patients:  140 - 180 mg/dL   Lab Results  Component Value Date   GLUCAP 204 (H) 01/14/2016   HGBA1C 12.8 (H) 10/29/2015    Review of Glycemic Control Results for Carmen [name], Carmen [name] (MRN [id number]) as of 01/14/2016 11:10  Ref. Range 01/13/2016 21:30 01/14/2016 06:10  Glucose-Capillary Latest Ref Range: 65 - 99 mg/dL 340 (H) 204 (H)   Diabetes history: DM2 Outpatient Diabetes medications: 70/30 5 units bid+ Tradjenta 5 mg qd Current orders for Inpatient glycemic control: 70/30 5 units bid+Novolog correction 0-9 units tid  Inpatient Diabetes Program Recommendations:    Please consider: -A1c to determine prehospital glycemic control. Last A1c 12.8 10/29/15 -Increase 70/30 to 7 units bid (Reviewed with patient last admission to take insulin prior to am breakfast& dinner)  Thank you, Nani Gasser. Nazariah Cadet, RN, MSN, CDE Inpatient Glycemic Control Team Team Pager [phone] (8am-5pm) 01/14/2016 11:13 AM

## 2016-01-14 NOTE — Care Management Obs Status (Signed)
[facility] NOTIFICATION   Patient Details  Name: Carmen [name] MRN: [id number] Date of Birth: Mar 22, 1933   Medicare Observation Status Notification Given:  Yes    Dawayne Patricia, RN 01/14/2016, 2:52 PM

## 2016-01-14 NOTE — Consult Note (Signed)
   [facility] CM Inpatient Consult   01/14/2016  Carmen [name] 1934/02/19 [id number]   Patient evaluated for community based chronic disease management services with [place] Management Program as a benefit of patient's [facility]. Chart review reveals Carmen [name] is a 82 y.o. female with medical history significant of anxiety, depression, osteoarthritis, cataracts, chronic lower back pain, peripheral vascular disease, coronary artery disease, pacemaker placement, history of CVA, renovascular hypertension, insulin requiring diabetes mellitus, migraine headaches who came to the emergency department due to chest pain with vomiting and radiating pain down right arm. Spoke with patient at bedside to explain [place] Management services. Consent form signed with left hand due to right hand paresis from stroke.   Patient will receive post hospital discharge call and will be evaluated for monthly home visits for assessments and disease process education.  Left contact information and [facility] literature at bedside. Made Inpatient Case Manager aware that [place] Management following. Of note, [facility] Care Management services does not replace or interfere with any services that are arranged by inpatient case management or social work.  For additional questions or referrals please contact:    Natividad Brood, RN BSN [facility] Liaison  [phone] business mobile phone Toll free office [phone]

## 2016-01-14 NOTE — Progress Notes (Signed)
PT BLOOD SUGAR WAS 340 WITH NO HS SLIDING SCALE. ORDERS GIVEN

## 2016-01-14 NOTE — Progress Notes (Signed)
PROGRESS NOTE    Carmen [name]  [id number] DOB: Aug 17, 1933 DOA: 01/13/2016 PCP: Maximino Greenland, MD  Brief Narrative:  Carmen [name] is a 82 y.o. female with medical history significant of anxiety, depression, osteoarthritis, cataracts, chronic lower back pain, peripheral vascular disease, coronary artery disease, pacemaker placement, history of CVA, renovascular hypertension, insulin requiring diabetes mellitus, migraine headaches who came to the emergency department due to chest pain.  Her patient's daughter, earlier today she had midsternal chest pain which radiated to her right shoulder with mild dyspnea. She also had an episode of emesis at home and another one in the ER. She denies abdominal pain, diarrhea, melena or hematochezia. She denies GU symptoms.  Incidentally she was found to have hyperkalemia with normal anion gap acidosis. Per her daughter [name], she recently was restarted on potassium supplementation and has had previous trouble with hypo and hyperkalemia.    ED Course: The patient received NS 1000 mL bolus and furosemide 40 mg ivp x1. WBC was 3.5, hb 14.3. Her potassium level has fluctuated from 5.5 to 6.3 mmol/L. Her BUN was 63 and creatinine 1.51 mg/dL. Her chest radiograph showed borderline cardiomegaly and linear atelectasis.  Assessment & Plan:   Principal Problem:   Hyperkalemia Active Problems:   DM (diabetes mellitus), type 2, uncontrolled, periph vascular complic (HCC)   CKD (chronic kidney disease), stage III   HTN (hypertension)   Metabolic acidosis   Chest pain   Normal anion gap metabolic acidosis   1-Chest pain;  Chest pain free.  Troponin negative.    2-Hyperkalemia; suspect AKI and potassium supplementation.  Received kayexalate,  Improving.  Repeat labs this afternoon.  Change diet to renal diet   3-AKI; continue with IV fluids.  Strict I and o.  Check renal US.   4-DM;  Gap normal.  Continue with 70-/30 SSI.   Non anion  gap metabolic acidosis.  Suspect related to AKI.  Iv fluids. Start oral bicarb.   DVT prophylaxis:  Code Status: DNR Family Communication: daughter at bedside.  Disposition Plan: home in 24 to 48 hours depending renal function and electrolytes.   Consultants:   None    Procedures:  Renal US pending.    Antimicrobials: none   Subjective: Feeling well. Denies dyspnea. Able to urinate  Objective: Vitals:   01/13/16 2033 01/13/16 2159 01/14/16 0403 01/14/16 1309  BP: (!) 197/56 (!) 140/51 (!) 122/44 (!) 163/64  Pulse: 65   62  Resp: 16   18  Temp: 98 F (36.7 C)   98.1 F (36.7 C)  TempSrc: Oral   Oral  SpO2: 100%   100%  Weight: 45.3 kg (99 lb 12.8 oz)     Height: 5\' 4"  (1.626 m)       Intake/Output Summary (Last 24 hours) at 01/14/16 1702 Last data filed at 01/14/16 1300  Gross per 24 hour  Intake              600 ml  Output             1801 ml  Net            -1201 ml   Filed Weights   01/13/16 2033  Weight: 45.3 kg (99 lb 12.8 oz)    Examination:  General exam: Appears calm and comfortable  Respiratory system: Clear to auscultation. Respiratory effort normal. Cardiovascular system: S1 & S2 heard, RRR. No JVD, murmurs, rubs, gallops or clicks. No pedal edema. Gastrointestinal system: Abdomen is nondistended, soft  and nontender. No organomegaly or masses felt. Normal bowel sounds heard. Central nervous system: Alert and oriented. No focal neurological deficits. Extremities: Symmetric 5 x 5 power. Skin: No rashes, lesions or ulcers Psychiatry: Judgement and insight appear normal. Mood & affect appropriate.     Data Reviewed: I have personally reviewed following labs and imaging studies  CBC:  Recent Labs Lab 01/13/16 1255 01/13/16 2250 01/14/16 0306  WBC 3.5* 2.9* 3.0*  NEUTROABS 2.2  --  1.5*  HGB 14.3 11.6* 11.7*  HCT 41.6 34.4* 35.6*  MCV 96.3 97.5 98.6  PLT 228 198 [id number]   Basic Metabolic Panel:  Recent Labs Lab 01/13/16 1534  01/13/16 1716 01/13/16 2250 01/14/16 0306 01/14/16 1304  NA 132* 134* 133* 138 133*  K 5.5*  5.7* 5.9* 6.3* 5.2* 4.6  CL 107 109 110 111 109  CO2 16* 16* 15* 16* 16*  GLUCOSE 199* 284* 341* 274* 339*  BUN 63* 61* 58* 60* 53*  CREATININE 1.52* 1.49* 1.55* 1.74* 1.44*  CALCIUM 8.2* 8.2* 7.6* 8.0* 7.2*   GFR: Estimated Creatinine Clearance: 22.3 mL/min (by C-G formula based on SCr of 1.44 mg/dL (H)). Liver Function Tests:  Recent Labs Lab 01/14/16 0306  AST 73*  ALT 59*  ALKPHOS 69  BILITOT 0.3  PROT 6.0*  ALBUMIN 3.3*   No results for input(s): LIPASE, AMYLASE in the last 168 hours. No results for input(s): AMMONIA in the last 168 hours. Coagulation Profile: No results for input(s): INR, PROTIME in the last 168 hours. Cardiac Enzymes:  Recent Labs Lab 01/13/16 1534 01/13/16 2250 01/14/16 0640  TROPONINI <0.03 0.03* 0.03*   BNP (last 3 results) No results for input(s): PROBNP in the last 8760 hours. HbA1C: No results for input(s): HGBA1C in the last 72 hours. CBG:  Recent Labs Lab 01/13/16 2130 01/14/16 0610 01/14/16 1122 01/14/16 1612  GLUCAP 340* 204* 190* 136*   Lipid Profile: No results for input(s): CHOL, HDL, LDLCALC, TRIG, CHOLHDL, LDLDIRECT in the last 72 hours. Thyroid Function Tests: No results for input(s): TSH, T4TOTAL, FREET4, T3FREE, THYROIDAB in the last 72 hours. Anemia Panel: No results for input(s): VITAMINB12, FOLATE, FERRITIN, TIBC, IRON, RETICCTPCT in the last 72 hours. Sepsis Labs: No results for input(s): PROCALCITON, LATICACIDVEN in the last 168 hours.  No results found for this or any previous visit (from the past 240 hour(s)).       Radiology Studies: Dg Chest 2 View  Result Date: 01/13/2016 CLINICAL DATA:  Mid sternal chest pain into the right shoulder EXAM: CHEST  2 VIEW COMPARISON:  10/28/2015 FINDINGS: Left-sided dual lead pacemaker with leads over the right atrium and right ventricle. Linear atelectasis or scar at  the right base. No acute infiltrate or effusion. Stable cardiomediastinal silhouette with atherosclerosis. No pneumothorax. Marked degenerative changes of the right shoulder. Vascular stent over the right upper chest. IMPRESSION: 1. Linear atelectasis or scar at the right base. No acute infiltrate or edema 2. Borderline cardiomegaly 3. Atherosclerosis of the aorta Electronically Signed   By: Donavan Foil M.D.   On: 01/13/2016 14:08        Scheduled Meds: . aspirin EC  81 mg Oral Daily  . clopidogrel  75 mg Oral Daily  . hydrALAZINE  100 mg Oral QID  . insulin aspart  0-9 Units Subcutaneous TID WC  . insulin aspart protamine- aspart  5 Units Subcutaneous BID WC  . isosorbide mononitrate  120 mg Oral Daily  . Magnesium Gluconate  250 mg Oral BID  .  metoprolol tartrate  25 mg Oral BID  . sodium bicarbonate  1,300 mg Oral BID  . sodium chloride flush  3 mL Intravenous Q12H   Continuous Infusions: . sodium chloride 75 mL/hr at 01/14/16 0627     LOS: 0 days    Time spent: 35 minutes.     Elmarie Shiley, MD [facility] Pager [phone]  If 7PM-7AM, please contact night-coverage [email or web address] Password [email or web address] 01/14/2016, 5:02 PM

## 2016-01-14 NOTE — Progress Notes (Addendum)
PAGE DR TO MAKE THEM AWARE OF POTASSIUM BEING A 6.3 ORDERS RECEIVED

## 2016-01-15 ENCOUNTER — Inpatient Hospital Stay (HOSPITAL_COMMUNITY): Payer: Commercial Managed Care - HMO

## 2016-01-15 LAB — GLUCOSE, CAPILLARY
GLUCOSE-CAPILLARY: 106 mg/dL — AB (ref 65–99)
GLUCOSE-CAPILLARY: 231 mg/dL — AB (ref 65–99)

## 2016-01-15 LAB — BASIC METABOLIC PANEL
Anion gap: 8 (ref 5–15)
BUN: 46 mg/dL — AB (ref 6–20)
CALCIUM: 7.5 mg/dL — AB (ref 8.9–10.3)
CO2: 18 mmol/L — AB (ref 22–32)
CREATININE: 1.32 mg/dL — AB (ref 0.44–1.00)
Chloride: 111 mmol/L (ref 101–111)
GFR calc Af Amer: 43 mL/min — ABNORMAL LOW (ref >60)
GFR calc non Af Amer: 37 mL/min — ABNORMAL LOW (ref >60)
GLUCOSE: 104 mg/dL — AB (ref 65–99)
Potassium: 4 mmol/L (ref 3.5–5.1)
Sodium: 137 mmol/L (ref 135–145)

## 2016-01-15 MED ORDER — PREDNISONE 20 MG PO TABS
20.0000 mg | ORAL_TABLET | Freq: Once | ORAL | Status: AC
  Administered 2016-01-15: 20 mg via ORAL
  Filled 2016-01-15: qty 1

## 2016-01-15 MED ORDER — COLCHICINE 0.6 MG PO TABS
0.6000 mg | ORAL_TABLET | Freq: Every day | ORAL | Status: DC
  Administered 2016-01-15: 0.6 mg via ORAL
  Filled 2016-01-15: qty 1

## 2016-01-15 MED ORDER — COLCHICINE 0.6 MG PO TABS: 0.6000 mg | ORAL_TABLET | Freq: Every day | ORAL | 0 refills | Status: DC

## 2016-01-15 MED ORDER — ACETAMINOPHEN 325 MG PO TABS: 650.0000 mg | ORAL_TABLET | ORAL | 0 refills | Status: DC | PRN

## 2016-01-15 MED ORDER — PREDNISONE 20 MG PO TABS: 20.0000 mg | ORAL_TABLET | Freq: Every day | ORAL | 0 refills | Status: AC

## 2016-01-15 MED ORDER — SODIUM BICARBONATE 650 MG PO TABS: 1300.0000 mg | ORAL_TABLET | Freq: Two times a day (BID) | ORAL | 0 refills | Status: DC

## 2016-01-15 NOTE — Care Management Note (Signed)
Case Management Note Marvetta Gibbons RN, BSN Unit 2W-Case Manager [phone]  Patient Details  Name: Carmen [name] MRN: [id number] Date of Birth: 12-07-1933  Subjective/Objective:  Pt admitted with hyperkalemia                  Action/Plan: PTA pt lived at home with spouse and sons, per pt she has insurance but has difficulty getting meds due to cost of medications and copays. Pt does not qualify for any assistance with MATCH- due to having medication coverage- referral to [facility] - pt has signed up for f/u with [facility] - they may can assist with resources in the community to assist pt with her concerns with social and financial situations.   Expected Discharge Date:    01/15/16              Expected Discharge Plan:  Home/Self Care  In-House Referral:     Discharge planning Services  CM Consult, Medication Assistance  Post Acute Care Choice:    Choice offered to:     DME Arranged:    DME Agency:     HH Arranged:    HH Agency:     Status of Service:  Completed, signed off  If discussed at [place] of Stay Meetings, dates discussed:    Discharge Disposition: home/self care   Additional Comments:  Dawayne Patricia, RN 01/15/2016, 10:19 AM

## 2016-01-15 NOTE — Discharge Summary (Signed)
Physician Discharge Summary  Carmen [name] [id number] DOB: 1934/01/31 DOA: 01/13/2016  PCP: Maximino Greenland, MD  Admit date: 01/13/2016 Discharge date: 01/15/2016  Admitted From: Home  Disposition:  Home    Recommendations for Outpatient Follow-up:  1. Follow up with PCP in 1-2 weeks 2. Please obtain BMP/CBC in one week, follow renal function.  3. Resume lasix as needed    Discharge Condition: stable.  CODE STATUS: DNR  Diet recommendation: Heart Healthy   Brief/Interim Summary: Carmen B Alfordis a 82 y.o.femalewith medical history significant of anxiety, depression, osteoarthritis, cataracts, chronic lower back pain, peripheral vascular disease, coronary artery disease, pacemaker placement, history of CVA, renovascular hypertension, insulin requiring diabetes mellitus, migraine headaches who came to the emergency department due to chest pain.  Her patient's daughter, earlier today she had midsternal chest pain which radiatedto her right shoulder with mild dyspnea. She also had an episode of emesis at home and another one in the ER. She denies abdominal pain, diarrhea, melena or hematochezia. She denies GU symptoms.  Incidentally she was found to have hyperkalemia with normal anion gap acidosis. Per her daughter Carmen [name], she recently was restarted on potassium supplementation and has had previous trouble with hypo and hyperkalemia.    ED Course:The patient received NS 1000 mL bolus and furosemide 40 mg ivp x1. WBC was 3.5, hb 14.3. Her potassium level has fluctuated from 5.5 to 6.3 mmol/L. Her BUN was 63 and creatinine 1.51 mg/dL. Her chest radiograph showed borderline cardiomegaly and linear atelectasis.  Assessment & Plan: 1-Chest pain;  Chest pain free.  Troponin negative.    2-Hyperkalemia; suspect AKI and potassium supplementation.  Received kayexalate,  Improving.  diet to renal diet   3-AKI; continue with IV fluids.   renal US negative for  hydronephrosis, medical diseases.  improved with IV fluids and potasium suplement.   4-DM;  Gap normal.  Continue with 70-/30 SSI.   Non anion gap metabolic acidosis.  Suspect related to AKI.  Iv fluids. Started oral bicarb.  Improved.   Big toe pain;  Colchicine and 2 days of prednisone.   Discharge Diagnoses:  Principal Problem:   Hyperkalemia Active Problems:   DM (diabetes mellitus), type 2, uncontrolled, periph vascular complic (HCC)   CKD (chronic kidney disease), stage III   HTN (hypertension)   Metabolic acidosis   Chest pain   Normal anion gap metabolic acidosis    Discharge Instructions  Discharge Instructions    AMB Referral to [facility] Management    Complete by:  As directed    Reason for consult:  [employer] patient 2nd hospitalization, post hospital follow up   Diagnoses of:   Diabetes Kidney Failure     Expected date of contact:  1-3 days (reserved for hospital discharges)   Please assign to community nurse for transition of care calls and assess for home visits.  Questions please call:   Natividad Brood, RN BSN [facility] Liaison  [phone] business mobile phone Toll free office [phone]   Diet - low sodium heart healthy    Complete by:  As directed    Increase activity slowly    Complete by:  As directed        Medication List    STOP taking these medications   COD LIVER OIL PO   furosemide 20 MG tablet Commonly known as:  LASIX   Potassium Chloride ER 20 MEQ Tbcr     TAKE these medications   acetaminophen 325 MG tablet Commonly known  as:  TYLENOL Take 2 tablets (650 mg total) by mouth every 4 (four) hours as needed for headache or mild pain.   aspirin EC 81 MG tablet Take 81 mg by mouth daily.   clopidogrel 75 MG tablet Commonly known as:  PLAVIX Take 75 mg by mouth daily.   colchicine 0.6 MG tablet Take 1 tablet (0.6 mg total) by mouth daily.   feeding supplement (GLUCERNA SHAKE) Liqd Take 237  mLs by mouth 2 (two) times daily between meals.   Fish Oil 1000 MG Caps Take 1,000 mg by mouth 2 (two) times daily.   Garlic [id number] MG Caps Take by mouth.   hydrALAZINE 100 MG tablet Commonly known as:  APRESOLINE Take 1 tablet (100 mg total) by mouth 4 (four) times daily.   insulin NPH-regular Human (70-30) 100 UNIT/ML injection Commonly known as:  NOVOLIN 70/30 Inject 5 Units into the skin 2 (two) times daily.   isosorbide mononitrate 120 MG 24 hr tablet Commonly known as:  IMDUR Take 1 tablet (120 mg total) by mouth daily.   linagliptin 5 MG Tabs tablet Commonly known as:  TRADJENTA Take 1 tablet (5 mg total) by mouth daily.   magnesium gluconate 30 MG tablet Commonly known as:  MAGONATE Take 1 tablet (30 mg total) by mouth 2 (two) times daily.   metoprolol tartrate 25 MG tablet Commonly known as:  LOPRESSOR Take 25 mg by mouth 2 (two) times daily.   predniSONE 20 MG tablet Commonly known as:  DELTASONE Take 1 tablet (20 mg total) by mouth daily with breakfast.   sodium bicarbonate 650 MG tablet Take 2 tablets (1,300 mg total) by mouth 2 (two) times daily.       Allergies  Allergen Reactions  . Norvasc [Amlodipine Besylate] Swelling  . Peanut-Containing Drug Products Other (See Comments)    Stomach pain    Consultations:  none   Procedures/Studies: Dg Chest 2 View  Result Date: 01/13/2016 CLINICAL DATA:  Mid sternal chest pain into the right shoulder EXAM: CHEST  2 VIEW COMPARISON:  10/28/2015 FINDINGS: Left-sided dual lead pacemaker with leads over the right atrium and right ventricle. Linear atelectasis or scar at the right base. No acute infiltrate or effusion. Stable cardiomediastinal silhouette with atherosclerosis. No pneumothorax. Marked degenerative changes of the right shoulder. Vascular stent over the right upper chest. IMPRESSION: 1. Linear atelectasis or scar at the right base. No acute infiltrate or edema 2. Borderline cardiomegaly 3.  Atherosclerosis of the aorta Electronically Signed   By: Donavan Foil M.D.   On: 01/13/2016 14:08   US Renal  Result Date: 01/15/2016 CLINICAL DATA:  Acute kidney injury. Elevated BUN and creatinine today. History of urinary tract infections, hypertension, diabetes. EXAM: RENAL / URINARY TRACT ULTRASOUND COMPLETE COMPARISON:  CT abdomen and pelvis 1186. FINDINGS: Right Kidney: Length: 8.7 cm. Diffusely increased hepatic parenchymal echotexture suggesting chronic medical renal disease. Simple appearing cyst in the upper pole measuring 1.7 cm maximal diameter. No hydronephrosis. Left Kidney: Length: 9.3 cm. Diffusely increased parenchymal echotexture suggesting chronic medical renal disease. Simple appearing cysts in the lower pole measuring 1.2 cm maximal diameter. No hydronephrosis. Bladder: No bladder wall thickening or filling defect. IMPRESSION: Echogenic renal parenchymal echotexture bilaterally consistent with chronic medical renal disease. Small bilateral renal cysts. No hydronephrosis. Electronically Signed   By: Lucienne Capers M.D.   On: 01/15/2016 01:52    (Echo, Carotid, EGD, Colonoscopy, ERCP)    Subjective:   Discharge Exam: Vitals:   01/14/16 2015 01/15/16  0448  BP: (!) 136/42 (!) 162/55  Pulse: 61 60  Resp: 20 20  Temp: 98 F (36.7 C) 98.2 F (36.8 C)   Vitals:   01/14/16 0403 01/14/16 1309 01/14/16 2015 01/15/16 0448  BP: (!) 122/44 (!) 163/64 (!) 136/42 (!) 162/55  Pulse:  62 61 60  Resp:  18 20 20   Temp:  98.1 F (36.7 C) 98 F (36.7 C) 98.2 F (36.8 C)  TempSrc:  Oral Oral Oral  SpO2:  100% 97% 97%  Weight:    49.8 kg (109 lb 11.2 oz)  Height:        General: Pt is alert, awake, not in acute distress Cardiovascular: RRR, S1/S2 +, no rubs, no gallops Respiratory: CTA bilaterally, no wheezing, no rhonchi Abdominal: Soft, NT, ND, bowel sounds + Extremities: no edema, no cyanosis    The results of significant diagnostics from this hospitalization  (including imaging, microbiology, ancillary and laboratory) are listed below for reference.     Microbiology: No results found for this or any previous visit (from the past 240 hour(s)).   Labs: BNP (last 3 results) No results for input(s): BNP in the last 8760 hours. Basic Metabolic Panel:  Recent Labs Lab 01/13/16 1716 01/13/16 2250 01/14/16 0306 01/14/16 1304 01/15/16 0211  NA 134* 133* 138 133* 137  K 5.9* 6.3* 5.2* 4.6 4.0  CL 109 110 111 109 111  CO2 16* 15* 16* 16* 18*  GLUCOSE 284* 341* 274* 339* 104*  BUN 61* 58* 60* 53* 46*  CREATININE 1.49* 1.55* 1.74* 1.44* 1.32*  CALCIUM 8.2* 7.6* 8.0* 7.2* 7.5*   Liver Function Tests:  Recent Labs Lab 01/14/16 0306  AST 73*  ALT 59*  ALKPHOS 69  BILITOT 0.3  PROT 6.0*  ALBUMIN 3.3*   No results for input(s): LIPASE, AMYLASE in the last 168 hours. No results for input(s): AMMONIA in the last 168 hours. CBC:  Recent Labs Lab 01/13/16 1255 01/13/16 2250 01/14/16 0306  WBC 3.5* 2.9* 3.0*  NEUTROABS 2.2  --  1.5*  HGB 14.3 11.6* 11.7*  HCT 41.6 34.4* 35.6*  MCV 96.3 97.5 98.6  PLT 228 198 212   Cardiac Enzymes:  Recent Labs Lab 01/13/16 1534 01/13/16 2250 01/14/16 0640  TROPONINI <0.03 0.03* 0.03*   BNP: Invalid input(s): POCBNP CBG:  Recent Labs Lab 01/14/16 0610 01/14/16 1122 01/14/16 1612 01/14/16 2055 01/15/16 0605  GLUCAP 204* 190* 136* 99 106*   D-Dimer No results for input(s): DDIMER in the last 72 hours. Hgb A1c No results for input(s): HGBA1C in the last 72 hours. Lipid Profile No results for input(s): CHOL, HDL, LDLCALC, TRIG, CHOLHDL, LDLDIRECT in the last 72 hours. Thyroid function studies No results for input(s): TSH, T4TOTAL, T3FREE, THYROIDAB in the last 72 hours.  Invalid input(s): FREET3 Anemia work up No results for input(s): VITAMINB12, FOLATE, FERRITIN, TIBC, IRON, RETICCTPCT in the last 72 hours. Urinalysis    Component Value Date/Time   COLORURINE YELLOW  10/29/2015 0006   APPEARANCEUR CLOUDY (A) 10/29/2015 0006   LABSPEC 1.015 10/29/2015 0006   PHURINE 5.5 10/29/2015 0006   GLUCOSEU 100 (A) 10/29/2015 0006   HGBUR NEGATIVE 10/29/2015 0006   BILIRUBINUR NEGATIVE 10/29/2015 0006   KETONESUR NEGATIVE 10/29/2015 0006   PROTEINUR >300 (A) 10/29/2015 0006   UROBILINOGEN 0.2 01/13/2015 2034   NITRITE NEGATIVE 10/29/2015 0006   LEUKOCYTESUR NEGATIVE 10/29/2015 0006   Sepsis Labs Invalid input(s): PROCALCITONIN,  WBC,  LACTICIDVEN Microbiology No results found for this or any previous  visit (from the past 240 hour(s)).   Time coordinating discharge: Over 30 minutes  SIGNED:   Elmarie Shiley, MD  [facility] 01/15/2016, 9:53 AM Pager [phone]  If 7PM-7AM, please contact night-coverage [email or web address] Password [email or web address]

## 2016-01-16 ENCOUNTER — Encounter: Payer: Self-pay | Admitting: *Deleted

## 2016-01-16 ENCOUNTER — Other Ambulatory Visit: Payer: Self-pay | Admitting: *Deleted

## 2016-01-16 NOTE — Patient Outreach (Signed)
[facility]) Care Management [place] Telephone Outreach, Transition of Care, day 1 01/16/2016  Carmen [name] 01/27/1934 [id number]  Successful telephone outreach to Carmen [name], 82 y/o female referred to [place] after recent hospitalization November 7-9, 2017 for chest pain, N/V, AKI secondary to dehydration and hyperkalemia.  Patient was discharged home 01/15/16 to self care.  Patient has history including, but not limited to DM- Type II with insulin dependence, HTN, CVA, CKD- stage III, HLD, dCHF, and anxiety/depression.  HIPAA/ identity verified with patient today by phone.  Today, patient reports that she is "doing pretty good," after hospital discharge yesterday.  Patient reports that her husband Carmen [name] (on [facility] Written consent) is her primary caregiver and manages her medications, meal preparation, and general day-to-day care.  Spoke with both patient and her husband today during phone call.  -- Husband reports has all medications, "except for 2" that he cannot name during time of phone call.  Husband reports pharmacy is scheduled to deliver these 2 medications to patient's home today.  Husband reports unable to perform medication reconciliation today, stating, "I am not near her medications right now."  -- Patient and husband report no PCP follow up visit scheduled yet post-hospital discharge.  Importance of making appointment discussed with both, and both agree to make PCP appointment asap.  -- Patient reports strong family support system and currently denies community resource needs.  -- Patient reports having Advanced Directives:  HCPOA (husband) and living will.  -- Patient reports "no problems" with ambulation or ADL's, reports uses cane "only when I go outside, I don't need it when I am inside my home."  Discussed [place] services with patient and her husband, and both verbalize agreement to continued [facility] follow up.  I provided patient  the phone numbers for the [facility] 24-hour nurse line, my direct phone number, as well as the main number to [place].  Patient denies further questions, concerns, needs, or problems at this time, and initial [place] home visit was scheduled for later this month.  Plan:  Patient will continue to take her medications as prescribed and attend all scheduled provider appointments.  Patient will contact her providers for any new concerns, questions, problems, or issues.   I will make patient's PCP aware of [facility] CM involvement in care.  [place] involvement for transition of care to continue with telephone outreach scheduled for next week and initial home visit later this month.  Carmen Rack, RN, BSN, [facility] Care Management  [phone]

## 2016-01-21 ENCOUNTER — Other Ambulatory Visit: Payer: Self-pay | Admitting: *Deleted

## 2016-01-21 ENCOUNTER — Encounter: Payer: Self-pay | Admitting: *Deleted

## 2016-01-21 NOTE — Patient Outreach (Signed)
[facility]) Care Management [place] Telephone Outreach, Transition of Care day 6 01/21/2016  [name] August 17, 1933 [id number]  Successful telephone outreach to Carmen [name], husband and caregiver (on [facility] CM written consent) of Carmen [name], 82 y/o female referred to [place] after recent hospitalization November 7-9, 2017 for chest pain, N/V, AKI secondary to dehydration and hyperkalemia.  Patient was discharged home 01/15/16 to self care.  Patient has history including, but not limited to DM- Type II with insulin dependence, HTN, CVA, CKD- stage III, HLD, dCHF, and anxiety/depression.  HIPAA/ identity verified with patient's husband today by phone.  Today, patient's husband reports that she is "still doing pretty good," after hospital discharge.    -- has all medications, and is taking all as prescribed.  Husband again reports unable to perform medication reconciliation today, stating time constraints; wants to go over medications during scheduled in-home visit next week.  Husband states he manages/ administers patient's daily medications.  -- still no PCP follow up visit scheduled yet post-hospital discharge; husband states that granddaughter transports patient to PCP appointments and makes appointments around her schedule.  Importance of making appointment again discussed, husband agrees to call granddaughter to make PCP appointment asap.  -- per husband report, patient is not in pain and has experienced no falls post-hospital discharge.  -- self-health management of DM:  Husband reports checking patient blood sugars 2-3 daily.  Reports fasting blood sugar of 119 this am.  Husband confirmed upcoming scheduled [place] in-home visit and that patient has the phone numbers for the [facility] 24-hour nurse line, my direct phone number, as well as the main number to [facility] CM.  Husband denies further questions, concerns, needs, or problems at this time, and  states that he prefers to go over all follow up instructions in person during scheduled upcoming in-home visit, due to personal time constraints today.   Plan:  Patient will continue to take her medications as prescribed and attend all scheduled provider appointments.  Patient will contact her providers for any new concerns, questions, problems, or issues.   [place] involvement for transition of care to continue with scheduled initial home visit next week.   Oneta Rack, RN, BSN, [facility] Care Management  [phone]

## 2016-01-26 ENCOUNTER — Encounter: Payer: Self-pay | Admitting: *Deleted

## 2016-01-26 ENCOUNTER — Other Ambulatory Visit: Payer: Self-pay | Admitting: *Deleted

## 2016-01-26 NOTE — Patient Outreach (Addendum)
[place] [facility]) Care Management  [place] Initial Home Visit, Transition of Care day 11 01/26/2016  Carmen [name] June 04, 1933 [id number]  Carmen [name] is an 82 y.o. female referred to [place] after recent hospitalization November 7-9, 2017 for chest pain, N/V, AKI secondary to dehydration and hyperkalemia. Patient was discharged home 01/15/16 to self care. Patient has history including, but not limited to DM- Type II with insulin dependence, HTN, CVA, CKD- stage III, HLD, dCHF, and anxiety/depression. HIPAA/ identity verified in person with patient today.  Patient's husband and caregiver (on [facility] CM written consent) is present in the home for today's visit.  Today, patient reports that she is "doing real good," after hospital discharge.   -- reports has all medications, and is taking all as prescribed. Husband manages/ administers patient's daily medications using pill box; new pill box provided to patient, as current pill box has only one box slot for each day of the week.  Husband states that larger pill box with more boxes/ per day would be helpful.  Patient's sons, whom live in the home administer patient's insulin injections.  Noted that patient has Lasix 20 mg po BID present in her home, but this is not currently on medication list; confirmed through EMR that Lasix was discontinued at the time of hospital discharge on 01/15/16.  Noted that patient does not have magnesium gluconate 30 mg (currently on medication list) in her home; patient and husband report they have "never" had this medication. Confirmed through EMR that this medication was ordered to "continue" after discharge on 01/15/16  Noted that patient has "Oyster shell Calcium 500" on medication list; this is not present in patient's home today.  Patient was recently discharged from hospital and all medications were thoroughly reviewed with patient and her husband/ caregiver today.  Call placed  to PCP Dr. Baird Cancer [phone]) to clarify, voicemail message left on Dr. Baird Cancer phone messaging system.  -- still no PCP follow up visit scheduled yet post-hospital discharge; husband states that granddaughter transports patient to PCP appointments and makes appointments around her schedule. Importance of making appointment again discussed, and I attempted to contact PCP office to facilitate scheduling of hospital discharge follow up appointment; voicemail message left on Dr. Baird Cancer phone messaging system.  Husband agrees to call granddaughter to make PCP appointment asap.  -- [employer] Needs:  Denied today by patient and her husband/ caregiver.  Both state they have a supportive family network that assist with patient care needs.  Patient also reports that her church family assists with care needs/ meals, as needed.   -- self-health management of DM:  Husband reports checking patient blood sugars 2-3 daily.  Reports fasting blood sugar of 108 this am.  Blood sugar ranges were reviewed with patient and caregiver today.  Blood sugar ranges over last week from 98-425.  Patient and husband/ caregiver deny further questions, concerns, needs, or problems at this time.  Subjective: "I am doing very good... I feel good, and I am eating good."  Objective:    BP (!) 162/60   Pulse 62   Resp 18   SpO2 98%    Review of Systems  Constitutional: Negative.  Negative for malaise/fatigue and weight loss.  Respiratory: Negative.  Negative for cough, shortness of breath and wheezing.   Cardiovascular: Negative.  Negative for chest pain, orthopnea and leg swelling.  Gastrointestinal: Negative.  Negative for abdominal pain and nausea.  Genitourinary: Negative.   Musculoskeletal: Positive  for back pain. Negative for falls.       Reports chronic back pain; is wearing a (R) arm sling, states , "my doctor told me to wear this after my stroke."  (R) arm/ hand is warm, exposed hand has full ROM,  patient has strong grip  Neurological: Negative.  Negative for weakness.  Psychiatric/Behavioral: Negative for depression. The patient is not nervous/anxious.     Physical Exam  Constitutional: She is oriented to person, place, and time. She appears well-developed and well-nourished. No distress.  Cardiovascular: Normal rate, regular rhythm, normal heart sounds and intact distal pulses.   Pulses:      Radial pulses are 2+ on the right side, and 2+ on the left side.       Dorsalis pedis pulses are 1+ on the right side, and 1+ on the left side.  Respiratory: Effort normal and breath sounds normal. No respiratory distress. She has no wheezes. She has no rales.  GI: Soft.  Musculoskeletal: She exhibits no edema.  Neurological: She is alert and oriented to person, place, and time.  Skin: Skin is warm and dry. No erythema.  Psychiatric: She has a normal mood and affect. Her behavior is normal. Judgment and thought content normal.    Encounter Medications:   Outpatient Encounter Prescriptions as of 01/26/2016  Medication Sig  . acetaminophen (TYLENOL) 325 MG tablet Take 2 tablets (650 mg total) by mouth every 4 (four) hours as needed for headache or mild pain.  Marland Kitchen aspirin EC 81 MG tablet Take 81 mg by mouth daily.  . clopidogrel (PLAVIX) 75 MG tablet Take 75 mg by mouth daily.  . colchicine 0.6 MG tablet Take 1 tablet (0.6 mg total) by mouth daily.  . feeding supplement, GLUCERNA SHAKE, (GLUCERNA SHAKE) LIQD Take 237 mLs by mouth 2 (two) times daily between meals.  . Garlic [id number] MG CAPS Take by mouth.  . hydrALAZINE (APRESOLINE) 100 MG tablet Take 1 tablet (100 mg total) by mouth 4 (four) times daily.  . insulin NPH-regular Human (NOVOLIN 70/30) (70-30) 100 UNIT/ML injection Inject 5 Units into the skin 2 (two) times daily.  . isosorbide mononitrate (IMDUR) 120 MG 24 hr tablet Take 1 tablet (120 mg total) by mouth daily.  Marland Kitchen linagliptin (TRADJENTA) 5 MG TABS tablet Take 1 tablet (5 mg total) by  mouth daily.  . magnesium gluconate (MAGONATE) 30 MG tablet Take 1 tablet (30 mg total) by mouth 2 (two) times daily.  . metoprolol tartrate (LOPRESSOR) 25 MG tablet Take 25 mg by mouth 2 (two) times daily.  . Omega-3 Fatty Acids (FISH OIL) 1000 MG CAPS Take 1,000 mg by mouth 2 (two) times daily.   . sodium bicarbonate 650 MG tablet Take 2 tablets (1,300 mg total) by mouth 2 (two) times daily.   No facility-administered encounter medications on file as of 01/26/2016.     Functional Status:   In your present state of health, do you have any difficulty performing the following activities: 01/16/2016 01/14/2016  Hearing? N N  Vision? N N  Difficulty concentrating or making decisions? Tempie Donning  Walking or climbing stairs? N N  Dressing or bathing? N N  Doing errands, shopping? Y N  Preparing Food and eating ? Y -  Using the Toilet? N -  In the past six months, have you accidently leaked urine? N -  Do you have problems with loss of bowel control? N -  Managing your Medications? Y -  Managing your Finances? Y -  Housekeeping or managing your Housekeeping? Y -  Some recent data might be hidden    Fall/Depression Screening:    PHQ 2/9 Scores 01/16/2016  PHQ - 2 Score 0    Assessment:  Carmen [name] appears to be recuperating well after her recent discharge from the hospital.  Patient has a supportive caregiver/ husband/ family that assist with her care needs.  Patient needs to schedule a follow up appointment with her PCP for post-hospitalization follow up and clarification of current medications.  Patient's husband agrees to schedule follow up appointment with PCP.  Plan:   Patient will continue to take her medications as prescribed and attend all scheduled provider appointments.  Patient will contact her providers for any new concerns, questions, problems, or issues.   Patient will continue monitoring and recording blood sugars.  [place] involvement for transition of care to  continue with scheduled telephone outreach next week.  I appreciate the opportunity to participate in Carmen [name]'s care,   Oneta Rack, RN, BSN, [place] Coordinator [facility] Care Management  [phone]

## 2016-02-02 ENCOUNTER — Other Ambulatory Visit: Payer: Self-pay | Admitting: *Deleted

## 2016-02-02 ENCOUNTER — Encounter: Payer: Self-pay | Admitting: *Deleted

## 2016-02-02 NOTE — Patient Outreach (Signed)
[facility]) Care Management [place] Telephone Outreach, Transition of Care day 18 02/02/2016  BECKY MISSOURI 11-20-1933 [id number]   GISSELL [name] is an 82 y.o. female referred to [place] after recent hospitalization November 7-9, 2017 for chest pain, N/V, AKI secondary to dehydration and hyperkalemia. Patient was discharged home 01/15/16 to self care. Patient has history including, but not limited to DM- Type II with insulin dependence, HTN, CVA, CKD- stage III, HLD, dCHF, and anxiety/depression. HIPAA/ identity verified with patient's spouse/ caregiver, Carmen [name] today, on [facility] written consent.    Today, Mr. Alfordreports that patient continues "doing real good."    -- PCP follow up visit scheduled for Thursday, February 05, 2016.  Husband reports granddaughter will provide transportation.    -- reports has all medications, and is taking all as prescribed, using new pill box provided to patient during last [place] in-home visit. Encouraged husband to take all of patient's current medications to upcoming provider appointmentfor review and clarification by PCP.  -- self-health management of DM: Husband reports that he is continuing to check patient blood sugars 2-3 daily. Reports fasting blood sugar of 270 this am, states, "she ate too much at Thanksgiving."  Denies questions about patient's blood sugars; husband states that he believes she "is right where she is always is," with her blood sugar readings.   Patient and husband/ caregiver deny further questions, concerns, needs, or problems at this time.  Plan:   Patient will continue to take her medications as prescribed and attend all scheduled provider appointments.  Patient will contact her providers for any new concerns, questions, problems, or issues.   Patient will continue monitoring and recording daily blood sugars.  [place] involvement for transition of care  to continue with scheduled telephone outreach next week.  Oneta Rack, RN, BSN, [employer] [facility] Care Management  [phone]

## 2016-02-05 DIAGNOSIS — E785 Hyperlipidemia, unspecified: Secondary | ICD-10-CM | POA: Diagnosis not present

## 2016-02-05 DIAGNOSIS — E875 Hyperkalemia: Secondary | ICD-10-CM | POA: Diagnosis not present

## 2016-02-05 DIAGNOSIS — R079 Chest pain, unspecified: Secondary | ICD-10-CM | POA: Diagnosis not present

## 2016-02-05 DIAGNOSIS — N183 Chronic kidney disease, stage 3 (moderate): Secondary | ICD-10-CM | POA: Diagnosis not present

## 2016-02-05 DIAGNOSIS — E1122 Type 2 diabetes mellitus with diabetic chronic kidney disease: Secondary | ICD-10-CM | POA: Diagnosis not present

## 2016-02-06 DIAGNOSIS — I509 Heart failure, unspecified: Secondary | ICD-10-CM | POA: Diagnosis not present

## 2016-02-06 DIAGNOSIS — E1101 Type 2 diabetes mellitus with hyperosmolarity with coma: Secondary | ICD-10-CM | POA: Diagnosis not present

## 2016-02-06 DIAGNOSIS — R0602 Shortness of breath: Secondary | ICD-10-CM | POA: Diagnosis not present

## 2016-02-09 ENCOUNTER — Encounter: Payer: Self-pay | Admitting: *Deleted

## 2016-02-09 ENCOUNTER — Other Ambulatory Visit: Payer: Self-pay | Admitting: *Deleted

## 2016-02-09 NOTE — Patient Outreach (Signed)
[place] [facility]) [place], Transition of Care day 25 02/09/2016  Carmen [name] 1933-04-23 [id number]   Successful telephone outreach to Carmen B Alfordis an 82 y.o.femalereferred to [place] after recent hospitalization November 7-9, 2017 for chest pain, N/V, AKI secondary to dehydration and hyperkalemia. Patient was discharged home 01/15/16 to self care. Patient has history including, but not limited to DM- Type II with insulin dependence, HTN, CVA, CKD- stage III, HLD, dCHF, and anxiety/depression. HIPAA/ identity verified with patient's spouse/ caregiver, Carmen [name] today, on [facility] written consent, and later during phone call with patient.   Today, Mrs. Alfordreports that is "doing realgood."    -- Attended PCP follow up visit Thursday, February 05, 2016.  Husband reports that all of patient's medications were "straightened out," and patient stated "the visit went "great."  -- reports has all medications, and is taking all as prescribed, using new pill box provided to patient during last [place] in-home visit.   -- self-health management of DM: Husband and patient reports that continuing to check patient blood sugars 2-3 daily.   [place] in home visit confirmed with patient/ spouse for later this week.  Patient and husband/ caregiver denyfurther questions, concerns, needs, or problems at this time.  Plan:   Patient will continue to take her medications as prescribed and attend all scheduled provider appointments.  Patient will contact her providers for any new concerns, questions, problems, or issues.   Patient will continue monitoring and recording daily blood sugars.  [place] involvement for transition of care to continue with routine home visit later this week.  Oneta Rack, RN, BSN, [employer] [facility] Care Management  [phone]

## 2016-02-12 ENCOUNTER — Other Ambulatory Visit: Payer: Self-pay | Admitting: *Deleted

## 2016-02-12 ENCOUNTER — Encounter: Payer: Self-pay | Admitting: *Deleted

## 2016-02-12 DIAGNOSIS — R531 Weakness: Secondary | ICD-10-CM | POA: Diagnosis not present

## 2016-02-12 NOTE — Patient Outreach (Signed)
[place] [facility]) Care Management  [place] CM Routine Home Visit, Transition of Care day 28 02/12/2016  Carmen [name] 1933-11-06 [id number]  Carmen [name] is an 82 y.o. female referred to [place] after recent hospitalization November 7-9, 2017 for chest pain, N/V, AKI secondary to dehydration and hyperkalemia. Patient was discharged home 01/15/16 to self care. Patient has history including, but not limited to DM- Type II with insulin dependence, HTN, CVA, CKD- stage III, HLD, dCHF, and anxiety/depression. HIPAA/ identity verified with patient in person today.  Today, Mrs. Alfordreports that she is"doing realgood."   --Attended PCP follow up visit Thursday, February 05, 2016. Husband reportsthat all of patient's medications were "straightened out," and patient stated "the visit went "great, she gave me a good report and told me that I didn't need to come back to see her again unless something was wrong."  Phone call placed to PCP today to confirm patient's active medications, spoke with Carmen [name], triage nurse for Dr. Baird [name]:  Patient has the following medications in her home, but not listed in EMR:    Oyster shell calcium 500 mg QD, Carmen [name] at Dr. Baird [name] confirmed this is part of patient's active current prescriptions  Furosemide 20 mg po BID, Carmen [name] at Dr. Baird [name] confirmed this is part of patient's active current prescriptions  Both of these medications were added to EMR, per care coordination phone call with Carmen [name] today. ----------------------------------------------------------------------------------------------------------------------------------------------------------------- The following medications were not present in patient's home, patient/ spouse confirm patient has not been taking:   NaCo3 650 mg (2) po BID  Mg Gluconate 30 mg po BID  Apresoline 100 mg po four times daily  Carmen [name], triage nurse at Dr. Baird [name] confirms today that these are  NOT a part of patient's active prescription medications/ she should not be taking  -- patient/ spouse reports has all medications, and is taking all as prescribed, medication reconciliation was completed during in-home visit today, and clarification as noted above was obtained by staff at Dr. Baird [name] office, patient's PCP.  -- self-health management of DM: Husband and patient reports that continuing to check patient blood sugars 2-3 daily. Fasting blood sugar today: 115.  Blood sugar ranges were reviewed with patient/ spouse, and over last 2 weeks have been ranging from 88 fasting--> 562 HS.  Patient unsure about her A1-C values; per review of EMR, last A1-C 12.8.  Provided educational materials to patient about A1-C with "Staying on Track" booklet, and general information about significance of A1-C levels.  Patient acknowledges that she "needs to do better" with her managing her diet.  -- Safety:  Patient denies falls, and is able to ambulate in home today without difficulty or use of assistive devices.  Patient and husband/ caregiver denyfurther questions, concerns, needs, or problems at this time.  Patient states that she is doing "so well after hospital visit," that she does not believe she needs additional in-home visits.  I let patient know that I would call next week for final TOC and patient and her spouse were agreeable.  Subjective: "I'm doing real good-- I went to the doctor like you told me to, and she thought I was doing good too."  Objective:   BP (!) 178/62   Pulse 60   Resp 16   SpO2 96%     Review of Systems  Constitutional: Negative.  Negative for malaise/fatigue.  Respiratory: Negative.  Negative for cough, shortness of breath and wheezing.   Cardiovascular: Positive for leg swelling.       +  1 bilateral ankle/ foot edema  Gastrointestinal: Negative for abdominal pain and nausea.  Genitourinary: Negative.   Musculoskeletal: Negative for falls and myalgias.        (R) arm sling intact; patient stets she is to continue wearing per MD instructions  Neurological: Negative.  Negative for weakness.  Psychiatric/Behavioral: Negative for depression. The patient is not nervous/anxious.     Physical Exam  Constitutional: She is oriented to person, place, and time. She appears well-developed and well-nourished. No distress.  Cardiovascular: Normal rate, regular rhythm, normal heart sounds and intact distal pulses.   Pulses:      Radial pulses are 2+ on the right side, and 2+ on the left side.       Dorsalis pedis pulses are 1+ on the right side, and 1+ on the left side.  Respiratory: Effort normal and breath sounds normal. No respiratory distress. She has no wheezes. She has no rales.  GI: Soft. Bowel sounds are normal.  Musculoskeletal: She exhibits edema.  See ROS  Neurological: She is alert and oriented to person, place, and time.  Skin: Skin is warm and dry.  Psychiatric: She has a normal mood and affect. Her behavior is normal. Judgment and thought content normal.    Encounter Medications:   Outpatient Encounter Prescriptions as of 02/12/2016  Medication Sig  . acetaminophen (TYLENOL) 325 MG tablet Take 2 tablets (650 mg total) by mouth every 4 (four) hours as needed for headache or mild pain.  Marland Kitchen aspirin EC 81 MG tablet Take 81 mg by mouth daily.  . clopidogrel (PLAVIX) 75 MG tablet Take 75 mg by mouth daily.  . colchicine 0.6 MG tablet Take 1 tablet (0.6 mg total) by mouth daily.  . feeding supplement, GLUCERNA SHAKE, (GLUCERNA SHAKE) LIQD Take 237 mLs by mouth 2 (two) times daily between meals.  . Garlic [id number] MG CAPS Take by mouth.  . hydrALAZINE (APRESOLINE) 100 MG tablet Take 1 tablet (100 mg total) by mouth 4 (four) times daily.  . insulin NPH-regular Human (NOVOLIN 70/30) (70-30) 100 UNIT/ML injection Inject 5 Units into the skin 2 (two) times daily.  . isosorbide mononitrate (IMDUR) 120 MG 24 hr tablet Take 1 tablet (120 mg total) by mouth  daily.  Marland Kitchen linagliptin (TRADJENTA) 5 MG TABS tablet Take 1 tablet (5 mg total) by mouth daily.  . magnesium gluconate (MAGONATE) 30 MG tablet Take 1 tablet (30 mg total) by mouth 2 (two) times daily. (Patient not taking: Reported on 01/26/2016)  . metoprolol tartrate (LOPRESSOR) 25 MG tablet Take 25 mg by mouth 2 (two) times daily.  . Omega-3 Fatty Acids (FISH OIL) 1000 MG CAPS Take 1,000 mg by mouth 2 (two) times daily.   . sodium bicarbonate 650 MG tablet Take 2 tablets (1,300 mg total) by mouth 2 (two) times daily.   No facility-administered encounter medications on file as of 02/12/2016.     Functional Status:   In your present state of health, do you have any difficulty performing the following activities: 01/16/2016 01/14/2016  Hearing? N N  Vision? N N  Difficulty concentrating or making decisions? Tempie Donning  Walking or climbing stairs? N N  Dressing or bathing? N N  Doing errands, shopping? Y N  Preparing Food and eating ? Y -  Using the Toilet? N -  In the past six months, have you accidently leaked urine? N -  Do you have problems with loss of bowel control? N -  Managing your Medications? Y -  Managing your Finances? Y -  Housekeeping or managing your Housekeeping? Y -  Some recent data might be hidden    Fall/Depression Screening:    PHQ 2/9 Scores 01/16/2016  PHQ - 2 Score 0    Assessment:  Carmen [name] continues to recuperate well after her recent discharge from the hospital.  Patient has a supportive caregiver/ husband/ family that assist with her care needs.  Patient scheduled and attended follow up appointment with her PCP for post-hospitalization current medications were clarified with patient.  Patient and her caregiver/ husband verbalized a good general understanding of patient's overall plan of care and of the purpose, dosing, and scheduling of her current medications .  Patient and her husband agree that patient is ready for discharge from [place]  program.  Plan:   Patient will continue to take her medications as prescribed and attend all scheduled provider appointments.  Patient will contact her providers for any new concerns, questions, problems, or issues.   Patient will continue monitoring and recording dailyblood sugars.  [place] involvement for transition of care to continue with scheduled telephone outreach next week.  Oneta Rack, RN, BSN, [employer] [facility] Care Management  [phone]

## 2016-02-16 ENCOUNTER — Encounter: Payer: Self-pay | Admitting: *Deleted

## 2016-02-16 ENCOUNTER — Other Ambulatory Visit: Payer: Self-pay | Admitting: *Deleted

## 2016-02-16 NOTE — Patient Outreach (Signed)
[place] [facility]) Care Management [place] Telephone Outreach, Transition of Care day 31 02/16/2016  Carmen [name] 1934-02-13 [id number]  Successful telephone outreach to Carmen [name], husband and caregiver (on [facility] written consent) of Carmen [name], 82 y.o. female referred to [place] after recent hospitalization November 7-9, 2017 for chest pain, N/V, AKI secondary to dehydration and hyperkalemia. Patient was discharged home 01/15/16 to self care. Patient has history including, but not limited to DM- Type II with insulin dependence, HTN, CVA, CKD- stage III, HLD, dCHF, and anxiety/depression. HIPAA/ identity verified with patient's husband Carmen [name] by phone today.  Today, Mr. Alfordreports that patient is"doing realgood," stating that she "is up and about today and feeling good."   --Husband reportsthat patient has/ is taking all medications as prescribed.  Husband voices no additional questions about patient's medications after call placed during [place] in-home visit last week, where clarification from PCP was received regarding patient's current medications.  -- self-health management of DM: Husband and patient reports that he is continuing to check patient blood sugars 2-3 daily. States "she is right where she is always is."  Denies further questions about DM management.  Husband/ caregiver deniesfurther questions, concerns, needs, or problems at this time.  Husband re-confirmed that patient does not believe she does she needs additional in-home visits at this time, again stating that she "is doing good."  I confirmed that Mr. Boeh has my direct phone number, the main [facility] CM office phone number and the 24-hour nurse advice line should needs arise in the future.  Plan:  Will close current [place] program, as patient has successfully met her goals, and will make patient's PCP aware of same.  It was a pleasure participating in  Carmen [name]'s care,   Oneta Rack, RN, BSN, [place] Coordinator [facility] Care Management  [phone]

## 2016-03-08 DIAGNOSIS — R0602 Shortness of breath: Secondary | ICD-10-CM | POA: Diagnosis not present

## 2016-03-08 DIAGNOSIS — E1101 Type 2 diabetes mellitus with hyperosmolarity with coma: Secondary | ICD-10-CM | POA: Diagnosis not present

## 2016-03-08 DIAGNOSIS — I509 Heart failure, unspecified: Secondary | ICD-10-CM | POA: Diagnosis not present

## 2016-03-27 ENCOUNTER — Emergency Department (HOSPITAL_COMMUNITY)
Admission: EM | Admit: 2016-03-27 | Discharge: 2016-03-27 | Disposition: A | Discharge status: Home / Self Care | Payer: Medicare HMO | Attending: Emergency Medicine | Admitting: Emergency Medicine

## 2016-03-27 ENCOUNTER — Emergency Department (HOSPITAL_COMMUNITY): Payer: Medicare HMO

## 2016-03-27 ENCOUNTER — Encounter (HOSPITAL_COMMUNITY): Payer: Self-pay

## 2016-03-27 DIAGNOSIS — E1165 Type 2 diabetes mellitus with hyperglycemia: Secondary | ICD-10-CM | POA: Insufficient documentation

## 2016-03-27 DIAGNOSIS — Z79899 Other long term (current) drug therapy: Secondary | ICD-10-CM | POA: Diagnosis not present

## 2016-03-27 DIAGNOSIS — R52 Pain, unspecified: Secondary | ICD-10-CM | POA: Diagnosis not present

## 2016-03-27 DIAGNOSIS — G8929 Other chronic pain: Secondary | ICD-10-CM | POA: Diagnosis not present

## 2016-03-27 DIAGNOSIS — Z950 Presence of cardiac pacemaker: Secondary | ICD-10-CM | POA: Diagnosis not present

## 2016-03-27 DIAGNOSIS — Z7982 Long term (current) use of aspirin: Secondary | ICD-10-CM | POA: Insufficient documentation

## 2016-03-27 DIAGNOSIS — I130 Hypertensive heart and chronic kidney disease with heart failure and stage 1 through stage 4 chronic kidney disease, or unspecified chronic kidney disease: Secondary | ICD-10-CM | POA: Insufficient documentation

## 2016-03-27 DIAGNOSIS — M79601 Pain in right arm: Secondary | ICD-10-CM | POA: Diagnosis not present

## 2016-03-27 DIAGNOSIS — Z8673 Personal history of transient ischemic attack (TIA), and cerebral infarction without residual deficits: Secondary | ICD-10-CM | POA: Insufficient documentation

## 2016-03-27 DIAGNOSIS — Z794 Long term (current) use of insulin: Secondary | ICD-10-CM | POA: Insufficient documentation

## 2016-03-27 DIAGNOSIS — N183 Chronic kidney disease, stage 3 (moderate): Secondary | ICD-10-CM | POA: Insufficient documentation

## 2016-03-27 DIAGNOSIS — I2510 Atherosclerotic heart disease of native coronary artery without angina pectoris: Secondary | ICD-10-CM | POA: Insufficient documentation

## 2016-03-27 DIAGNOSIS — M25511 Pain in right shoulder: Secondary | ICD-10-CM | POA: Insufficient documentation

## 2016-03-27 DIAGNOSIS — R739 Hyperglycemia, unspecified: Secondary | ICD-10-CM

## 2016-03-27 DIAGNOSIS — I5031 Acute diastolic (congestive) heart failure: Secondary | ICD-10-CM | POA: Diagnosis not present

## 2016-03-27 LAB — I-STAT VENOUS BLOOD GAS, ED
Acid-base deficit: 9 mmol/L — ABNORMAL HIGH (ref 0.0–2.0)
Bicarbonate: 18 mmol/L — ABNORMAL LOW (ref 20.0–28.0)
O2 Saturation: 57 %
PCO2 VEN: 40.8 mmHg — AB (ref 44.0–60.0)
TCO2: 19 mmol/L (ref 0–100)
pH, Ven: 7.253 (ref 7.250–7.430)
pO2, Ven: 34 mmHg (ref 32.0–45.0)

## 2016-03-27 LAB — BASIC METABOLIC PANEL
ANION GAP: 9 (ref 5–15)
BUN: 41 mg/dL — AB (ref 6–20)
CALCIUM: 8.1 mg/dL — AB (ref 8.9–10.3)
CO2: 17 mmol/L — AB (ref 22–32)
CREATININE: 1.84 mg/dL — AB (ref 0.44–1.00)
Chloride: 106 mmol/L (ref 101–111)
GFR calc Af Amer: 29 mL/min — ABNORMAL LOW (ref >60)
GFR calc non Af Amer: 25 mL/min — ABNORMAL LOW (ref >60)
GLUCOSE: 511 mg/dL — AB (ref 65–99)
Potassium: 4.2 mmol/L (ref 3.5–5.1)
Sodium: 132 mmol/L — ABNORMAL LOW (ref 135–145)

## 2016-03-27 LAB — CBC
HCT: 39.2 % (ref 36.0–46.0)
HEMOGLOBIN: 13.2 g/dL (ref 12.0–15.0)
MCH: 31.7 pg (ref 26.0–34.0)
MCHC: 33.7 g/dL (ref 30.0–36.0)
MCV: 94 fL (ref 78.0–100.0)
Platelets: 141 10*3/uL — ABNORMAL LOW (ref 150–400)
RBC: 4.17 MIL/uL (ref 3.87–5.11)
RDW: 13.5 % (ref 11.5–15.5)
WBC: 6.1 10*3/uL (ref 4.0–10.5)

## 2016-03-27 LAB — URINALYSIS, ROUTINE W REFLEX MICROSCOPIC
Bilirubin Urine: NEGATIVE
Glucose, UA: >=500 mg/dL — AB
Ketones, ur: NEGATIVE mg/dL
Leukocytes, UA: NEGATIVE
Nitrite: NEGATIVE
Protein, ur: 100 mg/dL — AB
SPECIFIC GRAVITY, URINE: 1.008 (ref 1.005–1.030)
Squamous Epithelial / LPF: NONE SEEN
pH: 5 (ref 5.0–8.0)

## 2016-03-27 LAB — CBG MONITORING, ED
GLUCOSE-CAPILLARY: 487 mg/dL — AB (ref 65–99)
Glucose-Capillary: 431 mg/dL — ABNORMAL HIGH (ref 65–99)

## 2016-03-27 MED ORDER — HYDROCODONE-ACETAMINOPHEN 5-325 MG PO TABS
0.5000 | ORAL_TABLET | Freq: Once | ORAL | Status: AC
  Administered 2016-03-27: 0.5 via ORAL
  Filled 2016-03-27: qty 1

## 2016-03-27 MED ORDER — INSULIN ASPART 100 UNIT/ML ~~LOC~~ SOLN
5.0000 [IU] | Freq: Once | SUBCUTANEOUS | Status: AC
  Administered 2016-03-27: 5 [IU] via SUBCUTANEOUS
  Filled 2016-03-27: qty 1

## 2016-03-27 MED ORDER — HYDROCODONE-ACETAMINOPHEN 5-325 MG PO TABS: 0.5000 | ORAL_TABLET | Freq: Four times a day (QID) | ORAL | 0 refills | Status: DC | PRN

## 2016-03-27 MED ORDER — SODIUM CHLORIDE 0.9 % IV BOLUS (SEPSIS)
500.0000 mL | Freq: Once | INTRAVENOUS | Status: AC
  Administered 2016-03-27: 500 mL via INTRAVENOUS

## 2016-03-27 NOTE — ED Notes (Signed)
Juice given; pt tray ordered.

## 2016-03-27 NOTE — ED Notes (Signed)
Ralene Bathe, MD aware of abnormal lab results

## 2016-03-27 NOTE — ED Notes (Signed)
ORDERED LUNCH TRAY 

## 2016-03-27 NOTE — ED Triage Notes (Signed)
[facility]- Pt has chronic right shoulder pain. Hx of arthritis. Scheduled to see a pain specialist but missed the appt. No falls or injury. 162/70, 72bpm, 16rr, 95% SPO2, CPG 561

## 2016-03-27 NOTE — ED Notes (Signed)
Patient transported to X-ray 

## 2016-03-27 NOTE — ED Notes (Signed)
Returned from radiology.  Remains comfortable.  IV infusing.

## 2016-03-27 NOTE — ED Notes (Signed)
Pt in constant pain due to need for right total shoulder replacement but pt refused procedure.  Woke this morning crying due to pain in that shoulder.  Ate breakfast, has had meds.

## 2016-03-27 NOTE — ED Provider Notes (Signed)
[facility] DEPT Provider Note   CSN: [id number] Arrival date & time: 03/27/16  1035     History   Chief Complaint Chief Complaint  Patient presents with  . Arm Pain  . Hyperglycemia    HPI Carmen [name] is a 83 y.o. female.  The history is provided by the patient. No language interpreter was used.  Arm Pain   Hyperglycemia   Carmen [name] is a 83 y.o. female who presents to the Emergency Department complaining of arm pain.  She presents for evaluation of right shoulder pain. She has chronic pain in her right shoulder but over the last 2-3 days she's had increased pain and is now unable to move the shoulder at all. No fevers, chest pain, shortness of breath.No recent injury. She is also diabetic and has been taking her insulin as prescribed, last dose this morning. Past Medical History:  Diagnosis Date  . Anxiety   . Arthritis    "everywhere"  . Cataract    "both eyes; blood pressure's always too high to have them fixed" (09/11/2014)  . Chronic lower back pain   . Claudication in peripheral vascular disease ([place])    02/16/10: Left CIA 9.0x28 Omnilink and REIA 8.0x40 seff expanding Zilver.   right subclavian artery stent 03/18/2008,  . Coronary artery disease   . Herniated lumbar intervertebral disc   . Hypertension   . IDDM (insulin dependent diabetes mellitus) ([place])   . Migraine    "used to have terrible migraines; stopped in the 1990's"  . Pacemaker   . Peripheral vascular disease ([place])   . Pneumonia X 2  . Renal disorder   . Renovascular hypertension     s/p left renal artery stent 12/2007.  S/P balloon angioplasty on 02/16/10 for ISR, BP was  controlled well since then.     . Stroke [facility]) 1999   Stroke and TIA in 1999 with right sided weakness, now with residual right arm weakness (09/11/2014)  . UTI (lower urinary tract infection) 02/2015    Patient Active Problem List   Diagnosis Date Noted  . Normal anion gap metabolic acidosis [id number]  . Chest pain  01/13/2016  . Hypoglycemia 10/28/2015  . DNR (do not resuscitate) discussion 02/27/2015  . Palliative care encounter 02/27/2015  . Acute on chronic kidney failure ([place])   . Constipation   . Dehydration, moderate 02/26/2015  . Failure to thrive in adult 02/26/2015  . Acute on chronic renal failure ([place]) 02/26/2015  . Acute cystitis without hematuria   . AKI (acute kidney injury) ([place]) 01/14/2015  . Metabolic acidosis [id number]  . Urinary retention 11/02/2014  . Lower limb ischemia; LLE 11/02/2014  . Hyperkalemia 11/02/2014  . Acute renal failure syndrome ([place])   . Hematuria   . Acute encephalopathy   . Altered mental status 10/28/2014  . Confusion 10/28/2014  . Pulmonary edema 09/11/2014  . Acute respiratory failure with hypoxia ([place]) 09/11/2014  . Acute diastolic heart failure ([place]) 09/11/2014  . Protein-calorie malnutrition, severe ([place]) 08/06/2013  . Hypertensive crisis 08/05/2013  . Hypertensive urgency 08/03/2013  . Weakness generalized 04/06/2013  . Dehydration 04/06/2013  . Transaminitis 04/06/2013  . Type 2 diabetes mellitus with hyperosmolar nonketotic hyperglycemia ([place]) 04/05/2013  . CKD (chronic kidney disease), stage III 04/05/2013  . HTN (hypertension) 04/05/2013  . Dyslipidemia 04/05/2013  . Abnormal LFTs 04/05/2013  . CVA (cerebral infarction) 10/15/2011  . [employer] 10/11/2011  . History of CVA (cerebrovascular accident) with associated mild right upper extremity hemiplegia  07/30/2011  . Retrovascular hypertension status post left renal artery stent 2009 and balloon angioplasty for in-stent restenosis 2011 04/05/2011  . Hypokalemia 03/28/2011  . Tobacco abuse 03/28/2011  . DM (diabetes mellitus), type 2, uncontrolled, periph vascular complic ([place]) [id number]  . Peripheral neuropathy ([place]) 03/28/2011    Past Surgical History:  Procedure Laterality Date  . ABDOMINAL ANGIOGRAM N/A 07/06/2011   Procedure: ABDOMINAL ANGIOGRAM;  Surgeon:  Laverda Page, MD;  [facility] LAB;  Service: Cardiovascular;  Laterality: N/A;  . APPENDECTOMY    . BACK SURGERY    . CAROTID ENDARTERECTOMY Left 1991    Stroke and TIA in 1999 with right sided weakness, now with residual right arm weakness  . CARPAL TUNNEL RELEASE Left   . COLONOSCOPY  07/30/2011   Procedure: COLONOSCOPY;  Surgeon: Inda Castle, MD;  Location: [facility];  Service: Endoscopy;  Laterality: N/A;  gi bleed  . ESOPHAGOGASTRODUODENOSCOPY  07/30/2011   Procedure: ESOPHAGOGASTRODUODENOSCOPY (EGD);  Surgeon: Inda Castle, MD;  Location: [facility];  Service: Endoscopy;  Laterality: N/A;  . EXCISIONAL HEMORRHOIDECTOMY    . INSERT / REPLACE / REMOVE PACEMAKER    . LOWER EXTREMITY ANGIOGRAM Right 05/29/2012   Procedure: LOWER EXTREMITY ANGIOGRAM;  Surgeon: Laverda Page, MD;  Location: [facility] CATH LAB;  Service: Cardiovascular;  Laterality: Right;  . LUMBAR LAMINECTOMY     'herniated disc"  . PERIPHERAL VASCULAR CATHETERIZATION N/A 11/05/2014   Procedure: Abdominal Aortogram;  Surgeon: Serafina Mitchell, MD;  Location: [facility] CV LAB;  Service: Cardiovascular;  Laterality: N/A;  . PERMANENT PACEMAKER INSERTION Left 06/28/2011   Procedure: PERMANENT PACEMAKER INSERTION;  Surgeon: Deboraha Sprang, MD;  Location: [facility] CATH LAB;  Service: Cardiovascular;  Laterality: Left;  . RENAL ANGIOGRAM N/A 07/06/2011   Procedure: RENAL ANGIOGRAM;  Surgeon: Laverda Page, MD;  Location: [facility] CATH LAB;  Service: Cardiovascular;  Laterality: N/A;  . TONSILLECTOMY    . URETERAL STENT PLACEMENT    . VAGINAL HYSTERECTOMY      OB History    No data available       Home Medications    Prior to Admission medications   Medication Sig Start Date End Date Taking? Authorizing Provider  acetaminophen (TYLENOL) 325 MG tablet Take 2 tablets (650 mg total) by mouth every 4 (four) hours as needed for headache or mild pain. 01/15/16   Belkys A Regalado, MD  aspirin EC 81 MG tablet Take 81  mg by mouth daily.    Historical Provider, MD  clopidogrel (PLAVIX) 75 MG tablet Take 75 mg by mouth daily.    Historical Provider, MD  colchicine 0.6 MG tablet Take 1 tablet (0.6 mg total) by mouth daily. Patient not taking: Reported on 02/12/2016 01/15/16   Belkys A Regalado, MD  feeding supplement, GLUCERNA SHAKE, (GLUCERNA SHAKE) LIQD Take 237 mLs by mouth 2 (two) times daily between meals. Patient not taking: Reported on 02/12/2016 01/16/15   Belkys A Regalado, MD  furosemide (LASIX) 20 MG tablet Take 20 mg by mouth 2 (two) times daily.    Historical Provider, MD  Garlic [id number] MG CAPS Take by mouth.    Historical Provider, MD  hydrALAZINE (APRESOLINE) 100 MG tablet Take 1 tablet (100 mg total) by mouth 4 (four) times daily. Patient not taking: Reported on 02/12/2016 10/30/15   Janece Canterbury, MD  HYDROcodone-acetaminophen (NORCO/VICODIN) 5-325 MG tablet Take 0.5 tablets by mouth every 6 (six) hours as needed. 03/27/16   Quintella Reichert, MD  insulin  NPH-regular Human (NOVOLIN 70/30) (70-30) 100 UNIT/ML injection Inject 5 Units into the skin 2 (two) times daily. 10/30/15   Janece Canterbury, MD  isosorbide mononitrate (IMDUR) 120 MG 24 hr tablet Take 1 tablet (120 mg total) by mouth daily. 12/12/12   Annita Brod, MD  linagliptin (TRADJENTA) 5 MG TABS tablet Take 1 tablet (5 mg total) by mouth daily. 12/01/14   Lauree Chandler, NP  magnesium gluconate (MAGONATE) 30 MG tablet Take 1 tablet (30 mg total) by mouth 2 (two) times daily. Patient not taking: Reported on 02/12/2016 10/30/15   Janece Canterbury, MD  metoprolol tartrate (LOPRESSOR) 25 MG tablet Take 25 mg by mouth 2 (two) times daily.    Historical Provider, MD  Omega-3 Fatty Acids (FISH OIL) 1000 MG CAPS Take 1,000 mg by mouth 2 (two) times daily.     Historical Provider, MD  Loma Boston Calcium 500 MG TABS Take 500 mg by mouth.    Historical Provider, MD  sodium bicarbonate 650 MG tablet Take 2 tablets (1,300 mg total) by mouth 2 (two) times  daily. Patient not taking: Reported on 02/12/2016 01/15/16   Elmarie Shiley, MD    Family History Family History  Problem Relation Age of Onset  . Cancer Father     Colon cancer  . Heart attack Mother     Social History Social History  Substance Use Topics  . Smoking status: Current Every Day Smoker    Packs/day: 0.00    Years: 70.00    Types: Cigarettes  . Smokeless tobacco: Never Used     Comment: 09/11/2014 "I used to smoke very heavy; down to 1 cigarette/day"  . Alcohol use No     Comment: "stopped drinking back in the 1970's"     Allergies   Norvasc [amlodipine besylate] and Peanut-containing drug products   Review of Systems Review of Systems  All other systems reviewed and are negative.    Physical Exam Updated Vital Signs BP 161/95   Pulse 65   Temp 98.7 F (37.1 C) (Oral)   Resp 18   Ht 5\' 4"  (1.626 m)   Wt 120 lb (54.4 kg)   SpO2 95%   BMI 20.60 kg/m   Physical Exam  Constitutional: She is oriented to person, place, and time. She appears well-developed and well-nourished.  HENT:  Head: Normocephalic and atraumatic.  Cardiovascular: Normal rate and regular rhythm.   No murmur heard. Pulmonary/Chest: Effort normal and breath sounds normal. No respiratory distress.  Abdominal: Soft. There is no tenderness. There is no rebound and no guarding.  Musculoskeletal: She exhibits no edema.  2+ radial pulses. Sensation to light touch intact throughout the right upper extremity. There is mild tenderness over the right shoulder without any erythema or swelling. There is winging of the right scapula with overlying lipoma. No erythema or some tenderness to the scapula. Unable to range the right shoulder secondary to pain.  Neurological: She is alert and oriented to person, place, and time.  Skin: Skin is warm and dry.  Psychiatric: She has a normal mood and affect. Her behavior is normal.  Nursing note and vitals reviewed.    ED Treatments / Results   Labs (all labs ordered are listed, but only abnormal results are displayed) Labs Reviewed  BASIC METABOLIC PANEL - Abnormal; Notable for the following:       Result Value   Sodium 132 (*)    CO2 17 (*)    Glucose, Bld 511 (*)  BUN 41 (*)    Creatinine, Ser 1.84 (*)    Calcium 8.1 (*)    GFR calc non Af Amer 25 (*)    GFR calc Af Amer 29 (*)    All other components within normal limits  CBC - Abnormal; Notable for the following:    Platelets 141 (*)    All other components within normal limits  URINALYSIS, ROUTINE W REFLEX MICROSCOPIC - Abnormal; Notable for the following:    Color, Urine STRAW (*)    Glucose, UA >=500 (*)    Hgb urine dipstick SMALL (*)    Protein, ur 100 (*)    Bacteria, UA RARE (*)    All other components within normal limits  CBG MONITORING, ED - Abnormal; Notable for the following:    Glucose-Capillary 487 (*)    All other components within normal limits  I-STAT VENOUS BLOOD GAS, ED - Abnormal; Notable for the following:    pCO2, Ven 40.8 (*)    Bicarbonate 18.0 (*)    Acid-base deficit 9.0 (*)    All other components within normal limits  CBG MONITORING, ED - Abnormal; Notable for the following:    Glucose-Capillary 431 (*)    All other components within normal limits    EKG  EKG Interpretation None       Radiology Dg Chest 2 View  Result Date: 03/27/2016 CLINICAL DATA:  Constant right arm pain. EXAM: CHEST  2 VIEW COMPARISON:  01/13/2016 FINDINGS: There is no focal parenchymal opacity. There is no pleural effusion or pneumothorax. The heart and mediastinal contours are unremarkable. There is a dual lead cardiac pacemaker. There is severe osteoarthritis of the right glenohumeral joint. IMPRESSION: Severe osteoarthritis of the right glenohumeral joint. No active cardiopulmonary disease. Electronically Signed   By: Kathreen Devoid   On: 03/27/2016 13:36   Dg Shoulder Right  Result Date: 03/27/2016 CLINICAL DATA:  Right shoulder pain. EXAM: RIGHT  SHOULDER - 2+ VIEW COMPARISON:  08/03/2013 FINDINGS: The bones are markedly osteopenic. There is advanced progressive osteoarthritis involving the glenohumeral joint. No dislocations or fracture. IMPRESSION: 1. Marked progressive glenohumeral joint osteoarthritis. 2. Osteopenia. Electronically Signed   By: Kerby Moors M.D.   On: 03/27/2016 13:36    Procedures Procedures (including critical care time)  Medications Ordered in ED Medications  sodium chloride 0.9 % bolus 500 mL (0 mLs Intravenous Stopped 03/27/16 1416)  insulin aspart (novoLOG) injection 5 Units (5 Units Subcutaneous Given 03/27/16 1443)  HYDROcodone-acetaminophen (NORCO/VICODIN) 5-325 MG per tablet 0.5 tablet (0.5 tablets Oral Given 03/27/16 1443)     Initial Impression / Assessment and Plan / ED Course  I have reviewed the triage vital signs and the nursing notes.  Pertinent labs & imaging results that were available during my care of the patient were reviewed by me and considered in my medical decision making (see chart for details).     Patient here for right shoulder pain and pain with range of motion. Exam is not consistent with septic arthritis or gouty arthritis. She does have considerable hyperglycemia today. Family states that she did take her insulin this morning but she also had syrup with breakfast. There is no evidence of DKA. Labs with stable renal insufficiency, no anion gap. Providing gentle IV fluid hydration, insulin for hyperglycemia. Following treatment in the emergency department she is feeling improved. Plan to DC home with pain control and discuss diabetic control. Close outpatient follow-up and return precautions discussed. The picture is not consistent with ACS,  PE, dissection.  Final Clinical Impressions(s) / ED Diagnoses   Final diagnoses:  Chronic right shoulder pain  Hyperglycemia    New Prescriptions Discharge Medication List as of 03/27/2016  3:26 PM    START taking these medications    Details  HYDROcodone-acetaminophen (NORCO/VICODIN) 5-325 MG tablet Take 0.5 tablets by mouth every 6 (six) hours as needed., Starting Sat 03/27/2016, Print         Quintella Reichert, MD 03/27/16 1556

## 2016-03-27 NOTE — ED Notes (Signed)
Food tray here.

## 2016-04-07 DIAGNOSIS — M19011 Primary osteoarthritis, right shoulder: Secondary | ICD-10-CM | POA: Diagnosis not present

## 2016-04-07 DIAGNOSIS — I1310 Hypertensive heart and chronic kidney disease without heart failure, with stage 1 through stage 4 chronic kidney disease, or unspecified chronic kidney disease: Secondary | ICD-10-CM | POA: Diagnosis not present

## 2016-04-07 DIAGNOSIS — M25511 Pain in right shoulder: Secondary | ICD-10-CM | POA: Diagnosis not present

## 2016-04-07 DIAGNOSIS — E1122 Type 2 diabetes mellitus with diabetic chronic kidney disease: Secondary | ICD-10-CM | POA: Diagnosis not present

## 2016-04-08 DIAGNOSIS — E46 Unspecified protein-calorie malnutrition: Secondary | ICD-10-CM | POA: Diagnosis not present

## 2016-04-08 DIAGNOSIS — E1101 Type 2 diabetes mellitus with hyperosmolarity with coma: Secondary | ICD-10-CM | POA: Diagnosis not present

## 2016-04-08 DIAGNOSIS — R0602 Shortness of breath: Secondary | ICD-10-CM | POA: Diagnosis not present

## 2016-04-08 DIAGNOSIS — I509 Heart failure, unspecified: Secondary | ICD-10-CM | POA: Diagnosis not present

## 2016-04-08 DIAGNOSIS — R54 Age-related physical debility: Secondary | ICD-10-CM | POA: Diagnosis not present

## 2016-05-11 DIAGNOSIS — I130 Hypertensive heart and chronic kidney disease with heart failure and stage 1 through stage 4 chronic kidney disease, or unspecified chronic kidney disease: Secondary | ICD-10-CM | POA: Diagnosis not present

## 2016-05-11 DIAGNOSIS — I5032 Chronic diastolic (congestive) heart failure: Secondary | ICD-10-CM | POA: Diagnosis not present

## 2016-05-11 DIAGNOSIS — E1122 Type 2 diabetes mellitus with diabetic chronic kidney disease: Secondary | ICD-10-CM | POA: Diagnosis not present

## 2016-05-11 DIAGNOSIS — N08 Glomerular disorders in diseases classified elsewhere: Secondary | ICD-10-CM | POA: Diagnosis not present

## 2016-05-11 DIAGNOSIS — E559 Vitamin D deficiency, unspecified: Secondary | ICD-10-CM | POA: Diagnosis not present

## 2016-05-11 DIAGNOSIS — N183 Chronic kidney disease, stage 3 (moderate): Secondary | ICD-10-CM | POA: Diagnosis not present

## 2016-05-11 DIAGNOSIS — Z0000 Encounter for general adult medical examination without abnormal findings: Secondary | ICD-10-CM | POA: Diagnosis not present

## 2016-07-16 DIAGNOSIS — Z950 Presence of cardiac pacemaker: Secondary | ICD-10-CM | POA: Diagnosis not present

## 2016-07-25 ENCOUNTER — Inpatient Hospital Stay (HOSPITAL_COMMUNITY)
Admission: EM | Admit: 2016-07-25 | Discharge: 2016-07-27 | DRG: 637 | Disposition: A | Discharge status: Home Health | Payer: Medicare HMO | Attending: Internal Medicine | Admitting: Internal Medicine

## 2016-07-25 ENCOUNTER — Emergency Department (HOSPITAL_COMMUNITY): Payer: Medicare HMO

## 2016-07-25 ENCOUNTER — Encounter (HOSPITAL_COMMUNITY): Payer: Self-pay | Admitting: Emergency Medicine

## 2016-07-25 ENCOUNTER — Inpatient Hospital Stay (HOSPITAL_COMMUNITY): Payer: Medicare HMO

## 2016-07-25 DIAGNOSIS — I509 Heart failure, unspecified: Secondary | ICD-10-CM | POA: Diagnosis not present

## 2016-07-25 DIAGNOSIS — G8929 Other chronic pain: Secondary | ICD-10-CM | POA: Diagnosis present

## 2016-07-25 DIAGNOSIS — N179 Acute kidney failure, unspecified: Secondary | ICD-10-CM | POA: Diagnosis not present

## 2016-07-25 DIAGNOSIS — N183 Chronic kidney disease, stage 3 unspecified: Secondary | ICD-10-CM | POA: Diagnosis present

## 2016-07-25 DIAGNOSIS — E119 Type 2 diabetes mellitus without complications: Secondary | ICD-10-CM | POA: Diagnosis present

## 2016-07-25 DIAGNOSIS — I129 Hypertensive chronic kidney disease with stage 1 through stage 4 chronic kidney disease, or unspecified chronic kidney disease: Secondary | ICD-10-CM | POA: Diagnosis present

## 2016-07-25 DIAGNOSIS — I69351 Hemiplegia and hemiparesis following cerebral infarction affecting right dominant side: Secondary | ICD-10-CM

## 2016-07-25 DIAGNOSIS — E1122 Type 2 diabetes mellitus with diabetic chronic kidney disease: Secondary | ICD-10-CM | POA: Diagnosis present

## 2016-07-25 DIAGNOSIS — Z8673 Personal history of transient ischemic attack (TIA), and cerebral infarction without residual deficits: Secondary | ICD-10-CM | POA: Diagnosis not present

## 2016-07-25 DIAGNOSIS — Z720 Tobacco use: Secondary | ICD-10-CM | POA: Diagnosis not present

## 2016-07-25 DIAGNOSIS — E1101 Type 2 diabetes mellitus with hyperosmolarity with coma: Secondary | ICD-10-CM

## 2016-07-25 DIAGNOSIS — I2510 Atherosclerotic heart disease of native coronary artery without angina pectoris: Secondary | ICD-10-CM | POA: Diagnosis not present

## 2016-07-25 DIAGNOSIS — I69398 Other sequelae of cerebral infarction: Secondary | ICD-10-CM

## 2016-07-25 DIAGNOSIS — Z91010 Allergy to peanuts: Secondary | ICD-10-CM

## 2016-07-25 DIAGNOSIS — N289 Disorder of kidney and ureter, unspecified: Secondary | ICD-10-CM

## 2016-07-25 DIAGNOSIS — E872 Acidosis: Secondary | ICD-10-CM | POA: Diagnosis not present

## 2016-07-25 DIAGNOSIS — R739 Hyperglycemia, unspecified: Secondary | ICD-10-CM | POA: Diagnosis present

## 2016-07-25 DIAGNOSIS — I639 Cerebral infarction, unspecified: Secondary | ICD-10-CM | POA: Diagnosis present

## 2016-07-25 DIAGNOSIS — R531 Weakness: Secondary | ICD-10-CM

## 2016-07-25 DIAGNOSIS — R471 Dysarthria and anarthria: Secondary | ICD-10-CM | POA: Diagnosis present

## 2016-07-25 DIAGNOSIS — R031 Nonspecific low blood-pressure reading: Secondary | ICD-10-CM | POA: Diagnosis not present

## 2016-07-25 DIAGNOSIS — Z794 Long term (current) use of insulin: Secondary | ICD-10-CM

## 2016-07-25 DIAGNOSIS — Z452 Encounter for adjustment and management of vascular access device: Secondary | ICD-10-CM | POA: Diagnosis not present

## 2016-07-25 DIAGNOSIS — E1129 Type 2 diabetes mellitus with other diabetic kidney complication: Secondary | ICD-10-CM | POA: Diagnosis not present

## 2016-07-25 DIAGNOSIS — R748 Abnormal levels of other serum enzymes: Secondary | ICD-10-CM | POA: Diagnosis present

## 2016-07-25 DIAGNOSIS — F419 Anxiety disorder, unspecified: Secondary | ICD-10-CM | POA: Diagnosis present

## 2016-07-25 DIAGNOSIS — Z950 Presence of cardiac pacemaker: Secondary | ICD-10-CM

## 2016-07-25 DIAGNOSIS — Z888 Allergy status to other drugs, medicaments and biological substances status: Secondary | ICD-10-CM

## 2016-07-25 DIAGNOSIS — R627 Adult failure to thrive: Secondary | ICD-10-CM | POA: Diagnosis present

## 2016-07-25 DIAGNOSIS — R778 Other specified abnormalities of plasma proteins: Secondary | ICD-10-CM

## 2016-07-25 DIAGNOSIS — E1165 Type 2 diabetes mellitus with hyperglycemia: Secondary | ICD-10-CM | POA: Diagnosis not present

## 2016-07-25 DIAGNOSIS — N189 Chronic kidney disease, unspecified: Secondary | ICD-10-CM

## 2016-07-25 DIAGNOSIS — E785 Hyperlipidemia, unspecified: Secondary | ICD-10-CM | POA: Diagnosis present

## 2016-07-25 DIAGNOSIS — R7989 Other specified abnormal findings of blood chemistry: Secondary | ICD-10-CM

## 2016-07-25 DIAGNOSIS — Z66 Do not resuscitate: Secondary | ICD-10-CM | POA: Diagnosis present

## 2016-07-25 DIAGNOSIS — E559 Vitamin D deficiency, unspecified: Secondary | ICD-10-CM | POA: Diagnosis present

## 2016-07-25 DIAGNOSIS — Z9114 Patient's other noncompliance with medication regimen: Secondary | ICD-10-CM

## 2016-07-25 DIAGNOSIS — Z90710 Acquired absence of both cervix and uterus: Secondary | ICD-10-CM

## 2016-07-25 DIAGNOSIS — Z7982 Long term (current) use of aspirin: Secondary | ICD-10-CM

## 2016-07-25 DIAGNOSIS — E876 Hypokalemia: Secondary | ICD-10-CM | POA: Diagnosis not present

## 2016-07-25 DIAGNOSIS — R079 Chest pain, unspecified: Secondary | ICD-10-CM | POA: Diagnosis not present

## 2016-07-25 DIAGNOSIS — G9341 Metabolic encephalopathy: Secondary | ICD-10-CM | POA: Diagnosis not present

## 2016-07-25 DIAGNOSIS — Z7902 Long term (current) use of antithrombotics/antiplatelets: Secondary | ICD-10-CM

## 2016-07-25 DIAGNOSIS — Z79899 Other long term (current) drug therapy: Secondary | ICD-10-CM

## 2016-07-25 DIAGNOSIS — T8089XA Other complications following infusion, transfusion and therapeutic injection, initial encounter: Secondary | ICD-10-CM | POA: Diagnosis not present

## 2016-07-25 DIAGNOSIS — E1100 Type 2 diabetes mellitus with hyperosmolarity without nonketotic hyperglycemic-hyperosmolar coma (NKHHC): Principal | ICD-10-CM | POA: Diagnosis present

## 2016-07-25 DIAGNOSIS — M545 Low back pain: Secondary | ICD-10-CM | POA: Diagnosis present

## 2016-07-25 DIAGNOSIS — E1151 Type 2 diabetes mellitus with diabetic peripheral angiopathy without gangrene: Secondary | ICD-10-CM | POA: Diagnosis not present

## 2016-07-25 DIAGNOSIS — I959 Hypotension, unspecified: Secondary | ICD-10-CM | POA: Diagnosis not present

## 2016-07-25 DIAGNOSIS — R4182 Altered mental status, unspecified: Secondary | ICD-10-CM | POA: Diagnosis not present

## 2016-07-25 DIAGNOSIS — Z960 Presence of urogenital implants: Secondary | ICD-10-CM | POA: Diagnosis present

## 2016-07-25 DIAGNOSIS — Z789 Other specified health status: Secondary | ICD-10-CM

## 2016-07-25 DIAGNOSIS — E860 Dehydration: Secondary | ICD-10-CM | POA: Diagnosis not present

## 2016-07-25 DIAGNOSIS — Z8249 Family history of ischemic heart disease and other diseases of the circulatory system: Secondary | ICD-10-CM

## 2016-07-25 DIAGNOSIS — R030 Elevated blood-pressure reading, without diagnosis of hypertension: Secondary | ICD-10-CM | POA: Diagnosis not present

## 2016-07-25 LAB — URINALYSIS, ROUTINE W REFLEX MICROSCOPIC
Bacteria, UA: NONE SEEN
Bilirubin Urine: NEGATIVE
Ketones, ur: NEGATIVE mg/dL
Leukocytes, UA: NEGATIVE
Nitrite: NEGATIVE
Specific Gravity, Urine: 1.012 (ref 1.005–1.030)
pH: 5 (ref 5.0–8.0)

## 2016-07-25 LAB — I-STAT CHEM 8, ED
BUN: 56 mg/dL — ABNORMAL HIGH (ref 6–20)
CALCIUM ION: 0.79 mmol/L — AB (ref 1.15–1.40)
CHLORIDE: 96 mmol/L — AB (ref 101–111)
Creatinine, Ser: 2.3 mg/dL — ABNORMAL HIGH (ref 0.44–1.00)
GLUCOSE: 479 mg/dL — AB (ref 65–99)
HCT: 51 % — ABNORMAL HIGH (ref 36.0–46.0)
HEMOGLOBIN: 17.3 g/dL — AB (ref 12.0–15.0)
Potassium: 4 mmol/L (ref 3.5–5.1)
Sodium: 130 mmol/L — ABNORMAL LOW (ref 135–145)
TCO2: 19 mmol/L (ref 0–100)

## 2016-07-25 LAB — CBG MONITORING, ED
GLUCOSE-CAPILLARY: 259 mg/dL — AB (ref 65–99)
GLUCOSE-CAPILLARY: 261 mg/dL — AB (ref 65–99)
GLUCOSE-CAPILLARY: 170 mg/dL — AB (ref 65–99)
GLUCOSE-CAPILLARY: 160 mg/dL — AB (ref 65–99)
Glucose-Capillary: 326 mg/dL — ABNORMAL HIGH (ref 65–99)
Glucose-Capillary: 348 mg/dL — ABNORMAL HIGH (ref 65–99)
Glucose-Capillary: 136 mg/dL — ABNORMAL HIGH (ref 65–99)
Glucose-Capillary: 121 mg/dL — ABNORMAL HIGH (ref 65–99)

## 2016-07-25 LAB — BASIC METABOLIC PANEL
Anion gap: 12 (ref 5–15)
BUN: 60 mg/dL — AB (ref 6–20)
CALCIUM: 6.1 mg/dL — AB (ref 8.9–10.3)
CO2: 16 mmol/L — AB (ref 22–32)
Chloride: 103 mmol/L (ref 101–111)
Creatinine, Ser: 2.1 mg/dL — ABNORMAL HIGH (ref 0.44–1.00)
GFR calc Af Amer: 24 mL/min — ABNORMAL LOW (ref >60)
GFR, EST NON AFRICAN AMERICAN: 21 mL/min — AB (ref >60)
GLUCOSE: 131 mg/dL — AB (ref 65–99)
POTASSIUM: 3.7 mmol/L (ref 3.5–5.1)
Sodium: 131 mmol/L — ABNORMAL LOW (ref 135–145)

## 2016-07-25 LAB — CBC WITH DIFFERENTIAL/PLATELET
Basophils Absolute: 0 10*3/uL (ref 0.0–0.1)
Basophils Relative: 0 %
EOS ABS: 0 10*3/uL (ref 0.0–0.7)
EOS PCT: 1 %
HCT: 45.2 % (ref 36.0–46.0)
Hemoglobin: 15.8 g/dL — ABNORMAL HIGH (ref 12.0–15.0)
LYMPHS ABS: 1.2 10*3/uL (ref 0.7–4.0)
Lymphocytes Relative: 18 %
MCH: 31.2 pg (ref 26.0–34.0)
MCHC: 35 g/dL (ref 30.0–36.0)
MCV: 89.2 fL (ref 78.0–100.0)
MONO ABS: 0.2 10*3/uL (ref 0.1–1.0)
MONOS PCT: 4 %
Neutro Abs: 4.9 10*3/uL (ref 1.7–7.7)
Neutrophils Relative %: 77 %
PLATELETS: 200 10*3/uL (ref 150–400)
RBC: 5.07 MIL/uL (ref 3.87–5.11)
RDW: 13 % (ref 11.5–15.5)
WBC: 6.4 10*3/uL (ref 4.0–10.5)

## 2016-07-25 LAB — COMPREHENSIVE METABOLIC PANEL
ALT: 56 U/L — AB (ref 14–54)
ANION GAP: 13 (ref 5–15)
AST: 40 U/L (ref 15–41)
Albumin: 2.8 g/dL — ABNORMAL LOW (ref 3.5–5.0)
Alkaline Phosphatase: 100 U/L (ref 38–126)
BUN: 60 mg/dL — AB (ref 6–20)
CHLORIDE: 102 mmol/L (ref 101–111)
CO2: 17 mmol/L — ABNORMAL LOW (ref 22–32)
CREATININE: 2.15 mg/dL — AB (ref 0.44–1.00)
Calcium: 6.2 mg/dL — CL (ref 8.9–10.3)
GFR calc non Af Amer: 21 mL/min — ABNORMAL LOW (ref >60)
GFR, EST AFRICAN AMERICAN: 24 mL/min — AB (ref >60)
GLUCOSE: 242 mg/dL — AB (ref 65–99)
Potassium: 3.8 mmol/L (ref 3.5–5.1)
SODIUM: 132 mmol/L — AB (ref 135–145)
TOTAL PROTEIN: 6 g/dL — AB (ref 6.5–8.1)
Total Bilirubin: 0.4 mg/dL (ref 0.3–1.2)

## 2016-07-25 LAB — BETA-HYDROXYBUTYRIC ACID: BETA-HYDROXYBUTYRIC ACID: 0.07 mmol/L (ref 0.05–0.27)

## 2016-07-25 LAB — MAGNESIUM
Magnesium: 2 mg/dL (ref 1.7–2.4)
Magnesium: 2 mg/dL (ref 1.7–2.4)

## 2016-07-25 LAB — CREATININE, URINE, RANDOM: CREATININE, URINE: 50.09 mg/dL

## 2016-07-25 LAB — TROPONIN I
TROPONIN I: 0.06 ng/mL — AB (ref <0.03)
TROPONIN I: 0.06 ng/mL — AB (ref <0.03)
TROPONIN I: 0.07 ng/mL — AB (ref <0.03)

## 2016-07-25 LAB — MRSA PCR SCREENING: MRSA by PCR: NEGATIVE

## 2016-07-25 LAB — PHOSPHORUS: PHOSPHORUS: 6 mg/dL — AB (ref 2.5–4.6)

## 2016-07-25 LAB — SODIUM, URINE, RANDOM: Sodium, Ur: 54 mmol/L

## 2016-07-25 MED ORDER — SODIUM CHLORIDE 0.9 % IV BOLUS (SEPSIS)
500.0000 mL | Freq: Once | INTRAVENOUS | Status: AC
  Administered 2016-07-25: 500 mL via INTRAVENOUS

## 2016-07-25 MED ORDER — SODIUM CHLORIDE 0.9 % IV SOLN: INTRAVENOUS | Status: AC

## 2016-07-25 MED ORDER — HEPARIN SODIUM (PORCINE) 5000 UNIT/ML IJ SOLN
5000.0000 [IU] | Freq: Three times a day (TID) | INTRAMUSCULAR | Status: DC
  Administered 2016-07-25 – 2016-07-27 (×5): 5000 [IU] via SUBCUTANEOUS
  Filled 2016-07-25 (×5): qty 1

## 2016-07-25 MED ORDER — SORBITOL 70 % SOLN: 30.0000 mL | Freq: Every day | Status: DC | PRN

## 2016-07-25 MED ORDER — OMEGA-3-ACID ETHYL ESTERS 1 G PO CAPS
1.0000 g | ORAL_CAPSULE | Freq: Two times a day (BID) | ORAL | Status: DC
  Administered 2016-07-26 – 2016-07-27 (×3): 1 g via ORAL
  Filled 2016-07-25 (×3): qty 1

## 2016-07-25 MED ORDER — SODIUM CHLORIDE 0.9 % IV BOLUS (SEPSIS)
1000.0000 mL | Freq: Once | INTRAVENOUS | Status: AC
  Administered 2016-07-25: 1000 mL via INTRAVENOUS

## 2016-07-25 MED ORDER — CLOPIDOGREL BISULFATE 75 MG PO TABS
75.0000 mg | ORAL_TABLET | Freq: Every day | ORAL | Status: DC
  Administered 2016-07-26 – 2016-07-27 (×2): 75 mg via ORAL
  Filled 2016-07-25 (×2): qty 1

## 2016-07-25 MED ORDER — ONDANSETRON HCL 4 MG/2ML IJ SOLN: 4.0000 mg | Freq: Four times a day (QID) | INTRAMUSCULAR | Status: DC | PRN

## 2016-07-25 MED ORDER — ACETAMINOPHEN 325 MG PO TABS: 650.0000 mg | ORAL_TABLET | ORAL | Status: DC | PRN

## 2016-07-25 MED ORDER — SODIUM CHLORIDE 0.9 % IV SOLN
INTRAVENOUS | Status: DC
  Administered 2016-07-25: 0.5 [IU]/h via INTRAVENOUS
  Filled 2016-07-25: qty 1

## 2016-07-25 MED ORDER — DEXTROSE-NACL 5-0.45 % IV SOLN
INTRAVENOUS | Status: DC
  Administered 2016-07-25: 125 mL/h via INTRAVENOUS

## 2016-07-25 MED ORDER — SODIUM CHLORIDE 0.9 % IV SOLN: INTRAVENOUS | Status: DC

## 2016-07-25 MED ORDER — SODIUM CHLORIDE 0.9 % IV SOLN
INTRAVENOUS | Status: DC
  Administered 2016-07-25: 4.2 [IU]/h via INTRAVENOUS
  Filled 2016-07-25: qty 1

## 2016-07-25 MED ORDER — DEXTROSE-NACL 5-0.45 % IV SOLN
INTRAVENOUS | Status: DC
  Administered 2016-07-25 – 2016-07-26 (×2): via INTRAVENOUS

## 2016-07-25 MED ORDER — SODIUM CHLORIDE 0.9 % IV SOLN
INTRAVENOUS | Status: DC
  Administered 2016-07-25: 12:00:00 via INTRAVENOUS

## 2016-07-25 MED ORDER — OXYCODONE-ACETAMINOPHEN 5-325 MG PO TABS: 1.0000 | ORAL_TABLET | ORAL | Status: DC | PRN

## 2016-07-25 MED ORDER — METOPROLOL TARTRATE 25 MG PO TABS
25.0000 mg | ORAL_TABLET | Freq: Two times a day (BID) | ORAL | Status: DC
  Administered 2016-07-25 – 2016-07-27 (×4): 25 mg via ORAL
  Filled 2016-07-25 (×4): qty 1

## 2016-07-25 MED ORDER — FISH OIL 1000 MG PO CAPS: 1000.0000 mg | ORAL_CAPSULE | Freq: Two times a day (BID) | ORAL | Status: DC

## 2016-07-25 MED ORDER — ASPIRIN 325 MG PO TABS
325.0000 mg | ORAL_TABLET | Freq: Every day | ORAL | Status: DC
  Administered 2016-07-26 – 2016-07-27 (×2): 325 mg via ORAL
  Filled 2016-07-25 (×2): qty 1

## 2016-07-25 NOTE — ED Notes (Signed)
Was called to room because pt was thirsty-- pt was sluggish to respond, sternal rub done, pt had left stare, Domenic Moras, PA notified, pt was able to follow commands, but left hand grip was slightly weaker than right. left sided gaze. Episode lasted approx 2-3 minutes. Pt became more alert, strong grips,, strong pedal pushes bilaterally.

## 2016-07-25 NOTE — ED Provider Notes (Signed)
[facility] DEPT Provider Note   CSN: [id number] Arrival date & time: 07/25/16  1007     History   Chief Complaint Chief Complaint  Patient presents with  . Fatigue  . Hyperglycemia    HPI Carmen [name] is a 83 y.o. female.  HPI   83 year old insulin-dependent diabetic patient with history of CAD, hypertension, pacemaker, renal disease, prior stroke with right-sided weakness brought here via EMS from home for evaluation of weakness. According to granddaughter who is at bedside. For the past 3 days patient has been less active and weaker than usual. This morning she was using the bathroom to urinate but took much longer than usual, her husband went to check up on her and states that she was tremulous and sweaty. EMS was contacted patient was brought here. Per EMS, patient had initial blood pressure in the 01U systolics, as well as having elevated blood sugar in the 500s. Patient initially refused to come to the ER. She is currently without any complaint. Aside from some mild discomfort to her left index finger that started this morning she denies having any arm pain, headache, neck pain, URI symptoms, chest pain, short of breath, back pain, abdominal pain, focal numbness or weakness. Patient states she has been compliant with the medication. She is normally active and states that she is able to move around the house without using a walker. She denies any recent sickness. No complaint of cough or urinary discomfort. Patient is on Plavix.  Past Medical History:  Diagnosis Date  . Anxiety   . Arthritis    "everywhere"  . Cataract    "both eyes; blood pressure's always too high to have them fixed" (09/11/2014)  . Chronic lower back pain   . Claudication in peripheral vascular disease ([place])    02/16/10: Left CIA 9.0x28 Omnilink and REIA 8.0x40 seff expanding Zilver.   right subclavian artery stent 03/18/2008,  . Coronary artery disease   . Herniated lumbar intervertebral disc   .  Hypertension   . IDDM (insulin dependent diabetes mellitus) ([place])   . Migraine    "used to have terrible migraines; stopped in the 1990's"  . Pacemaker   . Peripheral vascular disease ([place])   . Pneumonia X 2  . Renal disorder   . Renovascular hypertension     s/p left renal artery stent 12/2007.  S/P balloon angioplasty on 02/16/10 for ISR, BP was  controlled well since then.     . Stroke [facility]) 1999   Stroke and TIA in 1999 with right sided weakness, now with residual right arm weakness (09/11/2014)  . UTI (lower urinary tract infection) 02/2015    Patient Active Problem List   Diagnosis Date Noted  . Normal anion gap metabolic acidosis 93/23/5573  . Chest pain 01/13/2016  . Hypoglycemia 10/28/2015  . DNR (do not resuscitate) discussion 02/27/2015  . Palliative care encounter 02/27/2015  . Acute on chronic kidney failure ([place])   . Constipation   . Dehydration, moderate 02/26/2015  . Failure to thrive in adult 02/26/2015  . Acute on chronic renal failure ([place]) 02/26/2015  . Acute cystitis without hematuria   . AKI (acute kidney injury) ([place]) 01/14/2015  . Metabolic acidosis 22/04/5425  . Urinary retention 11/02/2014  . Lower limb ischemia; LLE 11/02/2014  . Hyperkalemia 11/02/2014  . Acute renal failure syndrome ([place])   . Hematuria   . Acute encephalopathy   . Altered mental status 10/28/2014  . Confusion 10/28/2014  . Pulmonary edema 09/11/2014  .  Acute respiratory failure with hypoxia ([place]) 09/11/2014  . Acute diastolic heart failure ([place]) 09/11/2014  . Protein-calorie malnutrition, severe ([place]) 08/06/2013  . Hypertensive crisis 08/05/2013  . Hypertensive urgency 08/03/2013  . Weakness generalized 04/06/2013  . Dehydration 04/06/2013  . Transaminitis 04/06/2013  . Type 2 diabetes mellitus with hyperosmolar nonketotic hyperglycemia ([place]) 04/05/2013  . CKD (chronic kidney disease), stage III 04/05/2013  . HTN (hypertension) 04/05/2013  . Dyslipidemia 04/05/2013  .  Abnormal LFTs 04/05/2013  . CVA (cerebral infarction) 10/15/2011  . [employer] 10/11/2011  . History of CVA (cerebrovascular accident) with associated mild right upper extremity hemiplegia 07/30/2011  . Retrovascular hypertension status post left renal artery stent 2009 and balloon angioplasty for in-stent restenosis 2011 04/05/2011  . Hypokalemia 03/28/2011  . Tobacco abuse 03/28/2011  . DM (diabetes mellitus), type 2, uncontrolled, periph vascular complic ([place]) 16/38/4665  . Peripheral neuropathy 03/28/2011    Past Surgical History:  Procedure Laterality Date  . ABDOMINAL ANGIOGRAM N/A 07/06/2011   Procedure: ABDOMINAL ANGIOGRAM;  Surgeon: Laverda Page, MD;  Location: [facility] CATH LAB;  Service: Cardiovascular;  Laterality: N/A;  . APPENDECTOMY    . BACK SURGERY    . CAROTID ENDARTERECTOMY Left 1991    Stroke and TIA in 1999 with right sided weakness, now with residual right arm weakness  . CARPAL TUNNEL RELEASE Left   . COLONOSCOPY  07/30/2011   Procedure: COLONOSCOPY;  Surgeon: Inda Castle, MD;  Location: [facility];  Service: Endoscopy;  Laterality: N/A;  gi bleed  . ESOPHAGOGASTRODUODENOSCOPY  07/30/2011   Procedure: ESOPHAGOGASTRODUODENOSCOPY (EGD);  Surgeon: Inda Castle, MD;  Location: [facility];  Service: Endoscopy;  Laterality: N/A;  . EXCISIONAL HEMORRHOIDECTOMY    . INSERT / REPLACE / REMOVE PACEMAKER    . LOWER EXTREMITY ANGIOGRAM Right 05/29/2012   Procedure: LOWER EXTREMITY ANGIOGRAM;  Surgeon: Laverda Page, MD;  Location[facility] CATH LAB;  Service: Cardiovascular;  Laterality: Right;  . LUMBAR LAMINECTOMY     'herniated disc"  . PERIPHERAL VASCULAR CATHETERIZATION N/A 11/05/2014   Procedure: Abdominal Aortogram;  Surgeon: Serafina Mitchell, MD;  Location: [facility] CV LAB;  Service: Cardiovascular;  Laterality: N/A;  . PERMANENT PACEMAKER INSERTION Left 06/28/2011   Procedure: PERMANENT PACEMAKER INSERTION;  Surgeon: Deboraha Sprang, MD;   Location: [facility] CATH LAB;  Service: Cardiovascular;  Laterality: Left;  . RENAL ANGIOGRAM N/A 07/06/2011   Procedure: RENAL ANGIOGRAM;  Surgeon: Laverda Page, MD;  Location: [facility] CATH LAB;  Service: Cardiovascular;  Laterality: N/A;  . TONSILLECTOMY    . URETERAL STENT PLACEMENT    . VAGINAL HYSTERECTOMY      OB History    No data available       Home Medications    Prior to Admission medications   Medication Sig Start Date End Date Taking? Authorizing Provider  acetaminophen (TYLENOL) 325 MG tablet Take 2 tablets (650 mg total) by mouth every 4 (four) hours as needed for headache or mild pain. 01/15/16   Regalado, Belkys A, MD  aspirin EC 81 MG tablet Take 81 mg by mouth daily.    [provider]  clopidogrel (PLAVIX) 75 MG tablet Take 75 mg by mouth daily.    [provider]  colchicine 0.6 MG tablet Take 1 tablet (0.6 mg total) by mouth daily. Patient not taking: Reported on 02/12/2016 01/15/16   Regalado, Jerald Kief A, MD  feeding supplement, GLUCERNA SHAKE, (GLUCERNA SHAKE) LIQD Take 237 mLs by mouth 2 (two) times daily between  meals. Patient not taking: Reported on 02/12/2016 01/16/15   Regalado, Jerald Kief A, MD  furosemide (LASIX) 20 MG tablet Take 20 mg by mouth 2 (two) times daily.    [provider]  Garlic 270 MG CAPS Take by mouth.    [provider]  hydrALAZINE (APRESOLINE) 100 MG tablet Take 1 tablet (100 mg total) by mouth 4 (four) times daily. Patient not taking: Reported on 02/12/2016 10/30/15   Janece Canterbury, MD  HYDROcodone-acetaminophen (NORCO/VICODIN) 5-325 MG tablet Take 0.5 tablets by mouth every 6 (six) hours as needed. 03/27/16   Quintella Reichert, MD  insulin NPH-regular Human (NOVOLIN 70/30) (70-30) 100 UNIT/ML injection Inject 5 Units into the skin 2 (two) times daily. 10/30/15   Janece Canterbury, MD  isosorbide mononitrate (IMDUR) 120 MG 24 hr tablet Take 1 tablet (120 mg total) by mouth daily. 12/12/12   Annita Brod, MD    linagliptin (TRADJENTA) 5 MG TABS tablet Take 1 tablet (5 mg total) by mouth daily. 12/01/14   Lauree Chandler, NP  magnesium gluconate (MAGONATE) 30 MG tablet Take 1 tablet (30 mg total) by mouth 2 (two) times daily. Patient not taking: Reported on 02/12/2016 10/30/15   Janece Canterbury, MD  metoprolol tartrate (LOPRESSOR) 25 MG tablet Take 25 mg by mouth 2 (two) times daily.    [provider]  Omega-3 Fatty Acids (FISH OIL) 1000 MG CAPS Take 1,000 mg by mouth 2 (two) times daily.     [provider]  [place] Calcium 500 MG TABS Take 500 mg by mouth.    [provider]  sodium bicarbonate 650 MG tablet Take 2 tablets (1,300 mg total) by mouth 2 (two) times daily. Patient not taking: Reported on 02/12/2016 01/15/16   Elmarie Shiley, MD    Family History Family History  Problem Relation Age of Onset  . Cancer Father        Colon cancer  . Heart attack Mother     Social History Social History  Substance Use Topics  . Smoking status: Current Every Day Smoker    Packs/day: 0.00    Years: 70.00    Types: Cigarettes  . Smokeless tobacco: Never Used     Comment: 09/11/2014 "I used to smoke very heavy; down to 1 cigarette/day"  . Alcohol use No     Comment: "stopped drinking back in the 1970's"     Allergies   Norvasc [amlodipine besylate] and Peanut-containing drug products   Review of Systems Review of Systems  All other systems reviewed and are negative.    Physical Exam Updated Vital Signs SpO2 98%   Physical Exam  Constitutional: No distress.  Frail elderly female laying in bed in no acute discomfort.  HENT:  Head: Atraumatic.  Mouth/Throat: Oropharynx is clear and moist.  Eyes: Conjunctivae and EOM are normal.  Neck: Neck supple.  No nuchal rigidity  Cardiovascular: Normal rate, regular rhythm and intact distal pulses.   Pulmonary/Chest: Effort normal and breath sounds normal. She has no wheezes. She has no rales.  Abdominal:  Soft. Bowel sounds are normal. She exhibits no distension. There is no tenderness.  Neurological: She is alert.  Neurologic exam:  Speech garbled secondary to prior stroke, pupils equal round reactive to light, extraocular movements intact  Normal peripheral visual fields Cranial nerves III through XII normal with chronic right-sided facial droop Follows commands, moves extremities x3 with right arm weakness,  Sensation normal to light touch and pinprick Coordination not tested Gait not  tested   Skin: No rash noted.  Psychiatric: She has a normal mood and affect.  Nursing note and vitals reviewed.    ED Treatments / Results  Labs (all labs ordered are listed, but only abnormal results are displayed) Labs Reviewed  CBC WITH DIFFERENTIAL/PLATELET - Abnormal; Notable for the following:       Result Value   Hemoglobin 15.8 (*)    All other components within normal limits  TROPONIN I - Abnormal; Notable for the following:    Troponin I 0.06 (*)    All other components within normal limits  URINALYSIS, ROUTINE W REFLEX MICROSCOPIC - Abnormal; Notable for the following:    Glucose, UA >=500 (*)    Hgb urine dipstick SMALL (*)    Protein, ur >=300 (*)    Squamous Epithelial / LPF 0-5 (*)    All other components within normal limits  COMPREHENSIVE METABOLIC PANEL - Abnormal; Notable for the following:    Sodium 132 (*)    CO2 17 (*)    Glucose, Bld 242 (*)    BUN 60 (*)    Creatinine, Ser 2.15 (*)    Calcium 6.2 (*)    Total Protein 6.0 (*)    Albumin 2.8 (*)    ALT 56 (*)    GFR calc non Af Amer 21 (*)    GFR calc Af Amer 24 (*)    All other components within normal limits  TROPONIN I - Abnormal; Notable for the following:    Troponin I 0.06 (*)    All other components within normal limits  I-STAT CHEM 8, ED - Abnormal; Notable for the following:    Sodium 130 (*)    Chloride 96 (*)    BUN 56 (*)    Creatinine, Ser 2.30 (*)    Glucose, Bld 479 (*)    Calcium, Ion 0.79  (*)    Hemoglobin 17.3 (*)    HCT 51.0 (*)    All other components within normal limits  CBG MONITORING, ED - Abnormal; Notable for the following:    Glucose-Capillary 348 (*)    All other components within normal limits  CBG MONITORING, ED - Abnormal; Notable for the following:    Glucose-Capillary 326 (*)    All other components within normal limits  CBG MONITORING, ED - Abnormal; Notable for the following:    Glucose-Capillary 259 (*)    All other components within normal limits  CBG MONITORING, ED - Abnormal; Notable for the following:    Glucose-Capillary 261 (*)    All other components within normal limits  CULTURE, BLOOD (ROUTINE X 2)  CULTURE, BLOOD (ROUTINE X 2)  URINE CULTURE  MAGNESIUM    EKG  EKG Interpretation  Date/Time:  Sunday Jul 25 2016 11:17:30 EDT Ventricular Rate:  78 PR Interval:    QRS Duration: 91 QT Interval:  447 QTC Calculation: 510 R Axis:   26 Text Interpretation:  Sinus rhythm LVH with secondary repolarization abnormality Probable anterior infarct, age indeterminate Prolonged QT interval since last tracing no significant change Confirmed by BELFI  MD, MELANIE ([id number]) on 07/25/2016 11:23:01 AM       Radiology Dg Chest 2 View  Result Date: 07/25/2016 CLINICAL DATA:  Weakness. Pt denies SOB and chest pain. Hx of CAD, HTN, pneumonia, stroke. Pt unable to extend right arm at all due to right sided weakness from previous stroke. EXAM: CHEST  2 VIEW COMPARISON:  03/27/2016 FINDINGS: Cardiac silhouette is normal in size. No  mediastinal or hilar masses. No evidence of adenopathy. Left anterior chest wall sequential pacemaker is stable and well positioned. There are prominent bronchovascular markings. There is no evidence of pneumonia or pulmonary edema. No pleural effusion or pneumothorax. Skeletal structures are demineralized but grossly intact. There arthropathic changes of the right shoulder, stable. IMPRESSION: No acute cardiopulmonary disease.  Electronically Signed   By: Lajean Manes M.D.   On: 07/25/2016 10:51    Procedures Procedures (including critical care time)  Medications Ordered in ED Medications  dextrose 5 %-0.45 % sodium chloride infusion (not administered)  insulin regular (NOVOLIN R,HUMULIN R) 100 Units in sodium chloride 0.9 % 100 mL (1 Units/mL) infusion (2.7 Units/hr Intravenous Rate/Dose Change 07/25/16 1233)  sodium chloride 0.9 % bolus 1,000 mL (0 mLs Intravenous Stopped 07/25/16 1232)    And  0.9 %  sodium chloride infusion ( Intravenous New Bag/Given 07/25/16 1200)     Initial Impression / Assessment and Plan / ED Course  I have reviewed the triage vital signs and the nursing notes.  Pertinent labs & imaging results that were available during my care of the patient were reviewed by me and considered in my medical decision making (see chart for details).     BP 130/73 (BP Location: Right Arm)   Pulse 61   Temp 98.1 F (36.7 C) (Oral)   Resp 18   Ht 5\' 4"  (1.626 m)   Wt 90 lb (40.8 kg)   SpO2 95%   BMI 15.45 kg/m    Final Clinical Impressions(s) / ED Diagnoses   Final diagnoses:  Weakness  Hyperglycemia  Renal insufficiency  Elevated troponin    New Prescriptions New Prescriptions   No medications on file   10:23 AM Elderly female here for evaluation of weakness and hyperglycemia. History of type 1 diabetes, states she has been compliant with her medication. Has stroke with right-sided residual weakness and facial droop. No new focal deficit identified. Patient is alert and oriented to place time but not year. She is in no acute discomfort. Initial EKG without acute changes. No active chest pain. Documented blood pressure in the ER is 867 systolic. Workup initiated.  Care discussed with Dr. Tamera Punt.    11:20 AM Elevated CBG of 479. Evidence of renal insufficiency with a creatinine of 2.3 which is elevated from prior value several months prior. Normal WBC. She does have an elevated troponin  of 0.06. She has history of CHF, unsure if this is demand ischemia versus cardiac source however patient denies having any active chest pain. Repeat EKG without any acute changes. Her QT is a bit more prolonged and therefore we should avoid medication that can worsen the condition. Patient placed on glucostabilizer for her hyperglycemia. Chest x-ray without signs of infection.  Given the elevated troponin in the setting of unchanged EKG and no CP, I did consult oncall cardiologist who reviewed the ECG and felt pt is stable on cardiac stand point. Plan to have pt admit for further evaluation of her sxs.    UA have not resulted yet.   1:04 PM Appreciate consultation from [facility] Dr. Grandville Silos who agrees to see and admit pt for further care.    1:09 PM Nurse notified that pt appears to have an altered mental status.  On reevaluation, pt was slurring her speech, eye gazed to the left.  She's able to follow commands and moving her extremities.  Sxs lasting for <5 minutes then pt return to baseline.  No c/o headache,  or chest pain.  Given prior stroke, will obtain head CT scan and consult neurology for her TIA sxs.    1:24 PM Appreciate consultation from neurologist Dr. Shon Hale who felt this is likely metabolic related and not TIA.  He does not recommend TIA work up at this time.    CRITICAL CARE Performed by: Domenic Moras Total critical care time: 30 minutes Critical care time was exclusive of separately billable procedures and treating other patients. Critical care was necessary to treat or prevent imminent or life-threatening deterioration. Critical care was time spent personally by me on the following activities: development of treatment plan with patient and/or surrogate as well as nursing, discussions with consultants, evaluation of patient's response to treatment, examination of patient, obtaining history from patient or surrogate, ordering and performing treatments and interventions,  ordering and review of laboratory studies, ordering and review of radiographic studies, pulse oximetry and re-evaluation of patient's condition.    Domenic Moras, PA-C 07/25/16 1434    Malvin Johns, MD 07/25/16 [phone]

## 2016-07-25 NOTE — ED Notes (Signed)
Orders from Dr. Grandville Silos -- keep insulin gtt on at 0.20ml/hr-- give NS bolus 500cc, recheck BMET at 2130== CO2 needs to be >20 before insulin gtt can be stopped. Keep D50.45NS at 100cc/hr

## 2016-07-25 NOTE — ED Notes (Signed)
I&O cath performed with [place] EMT

## 2016-07-25 NOTE — ED Notes (Signed)
Critical Calcium = 6.2 call from lab.

## 2016-07-25 NOTE — ED Notes (Signed)
Dr. Grandville Silos notified of 3 CBGs below 250

## 2016-07-25 NOTE — H&P (Signed)
History and Physical    Carmen [name] WPY:[id number] DOB: [birth year] DOA: 07/25/2016  PCP: Glendale Chard, MD  Patient coming from: Home  I have personally briefly reviewed patient's old medical records in [place]  Chief Complaint: Hyperglycemia/not feeling well  HPI: Carmen [name] is a 83 y.o. female with medical history significant of coronary artery disease, hypertension, history of CVA with residual right-sided weakness and some dysarthria, status post PPM, insulin-dependent diabetes mellitus, chronic kidney disease who presents to the ED with a three-day history of generalized weakness, decreased oral intake, feeling poorly. On the morning of admission patient was using the bathroom to urinate but took longer than usual and patient's granddaughter went to check on her and stated that patient was diaphoretic and tremulousness. EMS was contacted and PED and granddaughter patient's initial systolic blood pressure was in the 80s with elevated blood sugars in the 500s. EMS was called to the patient's home the night prior to admission however patient refused to come to the ED. Patient was subsequently brought to the emergency room. Patient denies any fevers, no shortness of breath, no chest pain, no abdominal pain, no constipation, no diarrhea, no dysuria, no melena, no hematemesis, no hematochezia, no worsening asymmetric weakness, no cough. Patient does endorse generalized weakness. During the ED course patient was noted to have an episode of altered mental status with patient was noted to have some slurred speech, eyes gaze to the left, which lasted less than 5 minutes and patient was back at baseline. Head CT was done which was negative. ED PA discussed episode with neurology who felt it was likely metabolic in nature. [facility] were called to admit the patient for further evaluation and management.  ED Course: In the ED i-STAT 8 obtained at his sodium of 1:30 chloride of 96  glucose of 479 with a BUN of 56 and a creatinine of 2.30. First set of troponin was 0.06. EKG with LVH with repolarization with some T-wave versus irritated 1 aVL V4 unchanged from prior EKG. CBC done with a white count of 6.4 hemoglobin of 15.8 platelet count of 200. Urinalysis with greater than 500 glucose negative ketones negative leukocytes negative nitrites greater than 300 protein 0-5 WBCs. Chest x-ray is negative for any acute abnormalities. Patient was placed on the glucose stabilizer placed on IV fluids. CT head negative for any acute abnormalities.  Review of Systems: As per HPI otherwise 10 point review of systems negative.   Past Medical History:  Diagnosis Date  . Anxiety   . Arthritis    "everywhere"  . Cataract    "both eyes; blood pressure's always too high to have them fixed" (09/11/2014)  . Chronic lower back pain   . Claudication in peripheral vascular disease ([place])    02/16/10: Left CIA 9.0x28 Omnilink and REIA 8.0x40 seff expanding Zilver.   right subclavian artery stent 03/18/2008,  . Coronary artery disease   . Herniated lumbar intervertebral disc   . Hypertension   . IDDM (insulin dependent diabetes mellitus) ([place])   . Migraine    "used to have terrible migraines; stopped in the 1990's"  . Pacemaker   . Peripheral vascular disease ([place])   . Pneumonia X 2  . Renal disorder   . Renovascular hypertension     s/p left renal artery stent 12/2007.  S/P balloon angioplasty on 02/16/10 for ISR, BP was  controlled well since then.     . Stroke [facility]) 1999   Stroke and TIA in  1999 with right sided weakness, now with residual right arm weakness (09/11/2014)  . UTI (lower urinary tract infection) 02/2015    Past Surgical History:  Procedure Laterality Date  . ABDOMINAL ANGIOGRAM N/A 07/06/2011   Procedure: ABDOMINAL ANGIOGRAM;  Surgeon: Laverda Page, MD;  Location: [facility] CATH LAB;  Service: Cardiovascular;  Laterality: N/A;  . APPENDECTOMY    . BACK SURGERY    . CAROTID  ENDARTERECTOMY Left 1991    Stroke and TIA in 1999 with right sided weakness, now with residual right arm weakness  . CARPAL TUNNEL RELEASE Left   . COLONOSCOPY  07/30/2011   Procedure: COLONOSCOPY;  Surgeon: Inda Castle, MD;  Location: [place];  Service: Endoscopy;  Laterality: N/A;  gi bleed  . ESOPHAGOGASTRODUODENOSCOPY  07/30/2011   Procedure: ESOPHAGOGASTRODUODENOSCOPY (EGD);  Surgeon: Inda Castle, MD;  Location: [place];  Service: Endoscopy;  Laterality: N/A;  . EXCISIONAL HEMORRHOIDECTOMY    . INSERT / REPLACE / REMOVE PACEMAKER    . LOWER EXTREMITY ANGIOGRAM Right 05/29/2012   Procedure: LOWER EXTREMITY ANGIOGRAM;  Surgeon: Laverda Page, MD;  Location: [facility] CATH LAB;  Service: Cardiovascular;  Laterality: Right;  . LUMBAR LAMINECTOMY     'herniated disc"  . PERIPHERAL VASCULAR CATHETERIZATION N/A 11/05/2014   Procedure: Abdominal Aortogram;  Surgeon: Serafina Mitchell, MD;  Location: [place] CV LAB;  Service: Cardiovascular;  Laterality: N/A;  . PERMANENT PACEMAKER INSERTION Left 06/28/2011   Procedure: PERMANENT PACEMAKER INSERTION;  Surgeon: Deboraha Sprang, MD;  Location: [facility] CATH LAB;  Service: Cardiovascular;  Laterality: Left;  . RENAL ANGIOGRAM N/A 07/06/2011   Procedure: RENAL ANGIOGRAM;  Surgeon: Laverda Page, MD;  Location: [facility] CATH LAB;  Service: Cardiovascular;  Laterality: N/A;  . TONSILLECTOMY    . URETERAL STENT PLACEMENT    . VAGINAL HYSTERECTOMY       reports that she has quit smoking. Her smoking use included Cigarettes. She smoked 0.00 packs per day for 70.00 years. She has never used smokeless tobacco. She reports that she does not drink alcohol or use drugs.  Allergies  Allergen Reactions  . Norvasc [Amlodipine Besylate] Swelling  . Peanut-Containing Drug Products Other (See Comments)    Stomach pain    Family History  Problem Relation Age of Onset  . Cancer Father        Colon cancer  . Heart attack Mother    Mother deceased in  her 65s from an acute MI. Father deceased in his 37s 19 these from mesothelioma per patient.  Prior to Admission medications   Medication Sig Start Date End Date Taking? Authorizing Provider  acetaminophen (TYLENOL) 325 MG tablet Take 2 tablets (650 mg total) by mouth every 4 (four) hours as needed for headache or mild pain. 01/15/16  Yes Regalado, Belkys A, MD  aspirin 325 MG tablet Take 325 mg by mouth daily.   Yes [provider]  clopidogrel (PLAVIX) 75 MG tablet Take 75 mg by mouth daily.   Yes [provider]  furosemide (LASIX) 20 MG tablet Take 20 mg by mouth 2 (two) times daily.   Yes [provider]  Garlic 295 MG CAPS Take 500 mg by mouth daily.    Yes [provider]  insulin NPH-regular Human (NOVOLIN 70/30) (70-30) 100 UNIT/ML injection Inject 5 Units into the skin 2 (two) times daily. 10/30/15  Yes Short, Noah Delaine, MD  isosorbide mononitrate (IMDUR) 120 MG 24 hr tablet Take 1 tablet (120 mg total) by mouth  daily. 12/12/12  Yes Annita Brod, MD  metoprolol tartrate (LOPRESSOR) 25 MG tablet Take 25 mg by mouth 2 (two) times daily.   Yes [provider]  Omega-3 Fatty Acids (FISH OIL) 1000 MG CAPS Take 1,000 mg by mouth 2 (two) times daily.    Yes [provider]  Loma [place] Calcium 500 MG TABS Take 500 mg by mouth.   Yes [provider]    Physical Exam: Vitals:   07/25/16 1150 07/25/16 1200 07/25/16 1215 07/25/16 1245  BP: 130/73 (!) 160/70 118/67 (!) 147/67  Pulse: 61 67 65 68  Resp: 18 18 16 17   Temp:      TempSrc:      SpO2: 95% 94% 95% 95%  Weight:      Height:        Constitutional: NAD, calm, comfortable Vitals:   07/25/16 1150 07/25/16 1200 07/25/16 1215 07/25/16 1245  BP: 130/73 (!) 160/70 118/67 (!) 147/67  Pulse: 61 67 65 68  Resp: 18 18 16 17   Temp:      TempSrc:      SpO2: 95% 94% 95% 95%  Weight:      Height:       Eyes: PERRLA, lids and conjunctivae normal ENMT: Mucous membranes  are dry. Posterior pharynx clear of any exudate or lesions.Normal dentition.  Neck: normal, supple, no masses, no thyromegaly Respiratory: clear to auscultation bilaterally, no wheezing, no crackles. Normal respiratory effort. No accessory muscle use.  Cardiovascular: Regular rate and rhythm, no murmurs / rubs / gallops. No extremity edema. 2+ pedal pulses. No carotid bruits.  Abdomen: no tenderness, no masses palpated. No hepatosplenomegaly. Bowel sounds positive.  Musculoskeletal: no clubbing / cyanosis. No joint deformity upper and lower extremities. Good ROM, no contractures. Normal muscle tone.  Skin: no rashes, lesions, ulcers. No induration Neurologic: Right facial weakness otherwise the rest of cranial nerves within normal limits. Sensation intact, DTR normal. Strength 5/5 in left upper left lower extremity. 3/5 right upper extremity strength. 3-4/5 right lower extremity strength.  Psychiatric: Normal judgment and insight. Alert and oriented x 3. Normal mood.    Labs on Admission: I have personally reviewed following labs and imaging studies  CBC:  Recent Labs Lab 07/25/16 1026 07/25/16 1034  WBC 6.4  --   NEUTROABS 4.9  --   HGB 15.8* 17.3*  HCT 45.2 51.0*  MCV 89.2  --   PLT 200  --    Basic Metabolic Panel:  Recent Labs Lab 07/25/16 1034 07/25/16 1341  NA 130* 132*  K 4.0 3.8  CL 96* 102  CO2  --  17*  GLUCOSE 479* 242*  BUN 56* 60*  CREATININE 2.30* 2.15*  CALCIUM  --  6.2*  MG  --  2.0   GFR: Estimated Creatinine Clearance: 13.4 mL/min (A) (by C-G formula based on SCr of 2.15 mg/dL (H)). Liver Function Tests:  Recent Labs Lab 07/25/16 1341  AST 40  ALT 56*  ALKPHOS 100  BILITOT 0.4  PROT 6.0*  ALBUMIN 2.8*   No results for input(s): LIPASE, AMYLASE in the last 168 hours. No results for input(s): AMMONIA in the last 168 hours. Coagulation Profile: No results for input(s): INR, PROTIME in the last 168 hours. Cardiac Enzymes:  Recent Labs Lab  07/25/16 1026 07/25/16 1341  TROPONINI 0.06* 0.06*   BNP (last 3 results) No results for input(s): PROBNP in the last 8760 hours. HbA1C: No results for input(s): HGBA1C in the last 72 hours. CBG:  Recent Labs Lab 07/25/16 1213 07/25/16 1228 07/25/16 1309 07/25/16 1334 07/25/16 1441  GLUCAP 348* 326* 259* 261* 170*   Lipid Profile: No results for input(s): CHOL, HDL, LDLCALC, TRIG, CHOLHDL, LDLDIRECT in the last 72 hours. Thyroid Function Tests: No results for input(s): TSH, T4TOTAL, FREET4, T3FREE, THYROIDAB in the last 72 hours. Anemia Panel: No results for input(s): VITAMINB12, FOLATE, FERRITIN, TIBC, IRON, RETICCTPCT in the last 72 hours. Urine analysis:    Component Value Date/Time   COLORURINE YELLOW 07/25/2016 1250   APPEARANCEUR CLEAR 07/25/2016 1250   LABSPEC 1.012 07/25/2016 1250   PHURINE 5.0 07/25/2016 1250   GLUCOSEU >=500 (A) 07/25/2016 1250   HGBUR SMALL (A) 07/25/2016 1250   BILIRUBINUR NEGATIVE 07/25/2016 1250   KETONESUR NEGATIVE 07/25/2016 1250   PROTEINUR >=300 (A) 07/25/2016 1250   UROBILINOGEN 0.2 01/13/2015 2034   NITRITE NEGATIVE 07/25/2016 1250   LEUKOCYTESUR NEGATIVE 07/25/2016 1250    Radiological Exams on Admission: Dg Chest 2 View  Result Date: 07/25/2016 CLINICAL DATA:  Weakness. Pt denies SOB and chest pain. Hx of CAD, HTN, pneumonia, stroke. Pt unable to extend right arm at all due to right sided weakness from previous stroke. EXAM: CHEST  2 VIEW COMPARISON:  03/27/2016 FINDINGS: Cardiac silhouette is normal in size. No mediastinal or hilar masses. No evidence of adenopathy. Left anterior chest wall sequential pacemaker is stable and well positioned. There are prominent bronchovascular markings. There is no evidence of pneumonia or pulmonary edema. No pleural effusion or pneumothorax. Skeletal structures are demineralized but grossly intact. There arthropathic changes of the right shoulder, stable. IMPRESSION: No acute cardiopulmonary  disease. Electronically Signed   By: Lajean Manes M.D.   On: 07/25/2016 10:51   Ct Head Wo Contrast  Result Date: 07/25/2016 CLINICAL DATA:  Confusion.  Altered mental status. EXAM: CT HEAD WITHOUT CONTRAST TECHNIQUE: Contiguous axial images were obtained from the base of the skull through the vertex without intravenous contrast. COMPARISON:  10/28/2015 FINDINGS: Brain: There is atrophy and chronic small vessel disease changes. Old lacunar infarct in the left basal ganglia. No acute intracranial abnormality. Specifically, no hemorrhage, hydrocephalus, mass lesion, acute infarction, or significant intracranial injury. Vascular: No hyperdense vessel or unexpected calcification. Skull: No acute calvarial abnormality. Sinuses/Orbits: Visualized paranasal sinuses and mastoids clear. Orbital soft tissues unremarkable. Other: None IMPRESSION: No acute intracranial abnormality. Atrophy, chronic microvascular disease. Electronically Signed   By: Rolm Baptise M.D.   On: 07/25/2016 14:21    EKG: Independently reviewed. LVH with repolarization. Some T-wave inversions in lead 1 aVL V4 unchanged from prior EKGs.  Assessment/Plan Principal Problem:   Type 2 diabetes mellitus with hyperosmolar nonketotic hyperglycemia (HCC) Active Problems:   Acute metabolic encephalopathy   Tobacco abuse   DM (diabetes mellitus), type 2, uncontrolled, periph vascular complic (HCC)   History of CVA (cerebrovascular accident) with associated mild right upper extremity hemiplegia   Cerebral infarction ([place])   CKD (chronic kidney disease), stage III   Dyslipidemia   Weakness generalized   Dehydration, moderate   Acute on chronic renal failure (HCC)   Hyperglycemia   Hypocalcemia   #1 type 2 diabetes mellitus with hyperosmolar nonketotic hyperglycemia Questionable etiology. Urinalysis negative for UTI. Chest x-ray negative for any acute abnormalities. Patient with minimally elevated troponins which seem to be plateauing.  Patient denies any chest pain no shortness of breath. Patient noted to have an anion gap of 15 with a bicarbonate of 19. Blood sugar of 479. Check blood cultures 2. Check a hemoglobin A1c.  Patient on the glucose stabilizer. Continue IV fluids. Serial BMETS. Once anion gap is closed and bicarbonate greater than 20 with blood sugars less than 2503 will transition to subcutaneous insulin and overlapped with glucose stabilizer for 2 hours and subsequently discontinue glucose stabilizer and subsequently place on sliding scale insulin.Marland Kitchen Keep nothing by mouth for now except ice chips. Follow.   #2 acute metabolic encephalopathy Patient did have an episode in the emergency room with patient was noted to have some slurred speech eyes gaze to the left lasting less than 5 minutes. Patient was able to follow commands at that time. Patient did not have any focal neurological deficits other than right residual weakness from prior CVA. CT head negative. Unable to get a MRI secondary to pacemaker. Likely secondary to problem #1. Patient has been pancultured. Continue IV fluids. Glucose stabilizer. Follow.  #3 acute on chronic kidney disease stage III  baseline creatinine 1.4-1.8 Likely secondary to a prerenal azotemia in the setting of diuretics. When EMS arrived patient's home she was noted to have a systolic blood pressures in the 80s. Check a fractional excretion of sodium. Hydrated with IV fluids. Follow.  #4 dehydration Likely secondary to pleural oral intake. IV fluids. Follow.  #5 hypocalcemia Check a magnesium level, phosphorus level, PTH, ionized calcium, vitamin D 25-dihydroxy, 1, 25 hydroxy vitamin D. Follow.  #6 metabolic acidosis Secondary to problem #1.  #7 minimally elevated troponins Cycle troponins. If troponins arising then will check a 2-D echo. Patient denies any ongoing chest pain. Continue home regimen of Coreg.  #8 generalized weakness/failure to thrive May be secondary to problem #1.  Patient also with poor oral intake likely a failure to thrive. PT/OT. If no significant improvement may need to consider palliative care evaluation for goals of care.   DVT prophylaxis: Heparin Code Status: DO NOT RESUSCITATE Family Communication: Updated patient and granddaughter at bedside. Disposition Plan: Home with home health went back at baseline. Consults called: None Admission status: Admit to SDU   [facility] MD [facility] Pager [phone]  If 7PM-7AM, please contact night-coverage [email or web address] Password [email or web address]  07/25/2016, 3:06 PM

## 2016-07-26 ENCOUNTER — Encounter (HOSPITAL_COMMUNITY): Payer: Self-pay | Admitting: Diagnostic Radiology

## 2016-07-26 ENCOUNTER — Inpatient Hospital Stay (HOSPITAL_COMMUNITY): Payer: Medicare HMO

## 2016-07-26 HISTORY — PX: IR FLUORO GUIDE CV LINE RIGHT: IMG2283

## 2016-07-26 HISTORY — PX: IR US GUIDE VASC ACCESS RIGHT: IMG2390

## 2016-07-26 LAB — BASIC METABOLIC PANEL
ANION GAP: 7 (ref 5–15)
Anion gap: 15 (ref 5–15)
Anion gap: 9 (ref 5–15)
Anion gap: 12 (ref 5–15)
BUN: 59 mg/dL — AB (ref 6–20)
BUN: 57 mg/dL — AB (ref 6–20)
BUN: 56 mg/dL — ABNORMAL HIGH (ref 6–20)
BUN: 45 mg/dL — ABNORMAL HIGH (ref 6–20)
CALCIUM: 6.8 mg/dL — AB (ref 8.9–10.3)
CHLORIDE: 105 mmol/L (ref 101–111)
CO2: 13 mmol/L — AB (ref 22–32)
CO2: 17 mmol/L — ABNORMAL LOW (ref 22–32)
CO2: 15 mmol/L — ABNORMAL LOW (ref 22–32)
CO2: 23 mmol/L (ref 22–32)
Calcium: 6 mg/dL — CL (ref 8.9–10.3)
Calcium: 5.9 mg/dL — CL (ref 8.9–10.3)
Calcium: 5.6 mg/dL — CL (ref 8.9–10.3)
Chloride: 103 mmol/L (ref 101–111)
Chloride: 103 mmol/L (ref 101–111)
Chloride: 101 mmol/L (ref 101–111)
Creatinine, Ser: 2.08 mg/dL — ABNORMAL HIGH (ref 0.44–1.00)
Creatinine, Ser: 2.02 mg/dL — ABNORMAL HIGH (ref 0.44–1.00)
Creatinine, Ser: 1.88 mg/dL — ABNORMAL HIGH (ref 0.44–1.00)
Creatinine, Ser: 1.71 mg/dL — ABNORMAL HIGH (ref 0.44–1.00)
GFR calc Af Amer: 25 mL/min — ABNORMAL LOW (ref >60)
GFR calc Af Amer: 26 mL/min — ABNORMAL LOW (ref >60)
GFR calc Af Amer: 28 mL/min — ABNORMAL LOW (ref >60)
GFR calc Af Amer: 31 mL/min — ABNORMAL LOW (ref >60)
GFR calc non Af Amer: 21 mL/min — ABNORMAL LOW (ref >60)
GFR calc non Af Amer: 22 mL/min — ABNORMAL LOW (ref >60)
GFR calc non Af Amer: 24 mL/min — ABNORMAL LOW (ref >60)
GFR calc non Af Amer: 27 mL/min — ABNORMAL LOW (ref >60)
GLUCOSE: 198 mg/dL — AB (ref 65–99)
Glucose, Bld: 151 mg/dL — ABNORMAL HIGH (ref 65–99)
Glucose, Bld: 150 mg/dL — ABNORMAL HIGH (ref 65–99)
Glucose, Bld: 187 mg/dL — ABNORMAL HIGH (ref 65–99)
POTASSIUM: 4.5 mmol/L (ref 3.5–5.1)
POTASSIUM: 3.9 mmol/L (ref 3.5–5.1)
Potassium: 4 mmol/L (ref 3.5–5.1)
Potassium: 3.5 mmol/L (ref 3.5–5.1)
Sodium: 131 mmol/L — ABNORMAL LOW (ref 135–145)
Sodium: 131 mmol/L — ABNORMAL LOW (ref 135–145)
Sodium: 130 mmol/L — ABNORMAL LOW (ref 135–145)
Sodium: 131 mmol/L — ABNORMAL LOW (ref 135–145)

## 2016-07-26 LAB — HEMOGLOBIN A1C
Hgb A1c MFr Bld: >15.5 % — ABNORMAL HIGH (ref 4.8–5.6)
Mean Plasma Glucose: >398 mg/dL

## 2016-07-26 LAB — GLUCOSE, CAPILLARY
GLUCOSE-CAPILLARY: 176 mg/dL — AB (ref 65–99)
GLUCOSE-CAPILLARY: 188 mg/dL — AB (ref 65–99)
GLUCOSE-CAPILLARY: 319 mg/dL — AB (ref 65–99)
Glucose-Capillary: 184 mg/dL — ABNORMAL HIGH (ref 65–99)
Glucose-Capillary: 191 mg/dL — ABNORMAL HIGH (ref 65–99)
Glucose-Capillary: 149 mg/dL — ABNORMAL HIGH (ref 65–99)
Glucose-Capillary: 212 mg/dL — ABNORMAL HIGH (ref 65–99)
Glucose-Capillary: 179 mg/dL — ABNORMAL HIGH (ref 65–99)

## 2016-07-26 LAB — TROPONIN I: TROPONIN I: 0.05 ng/mL — AB (ref <0.03)

## 2016-07-26 LAB — URINE CULTURE: Culture: NO GROWTH

## 2016-07-26 MED ORDER — SODIUM CHLORIDE 0.9 % IV SOLN
2.0000 g | Freq: Once | INTRAVENOUS | Status: AC
  Administered 2016-07-26: 2 g via INTRAVENOUS
  Filled 2016-07-26: qty 20

## 2016-07-26 MED ORDER — CEFAZOLIN SODIUM-DEXTROSE 2-4 GM/100ML-% IV SOLN
2.0000 g | Freq: Once | INTRAVENOUS | Status: AC
  Administered 2016-07-26: 2 g via INTRAVENOUS
  Filled 2016-07-26: qty 100

## 2016-07-26 MED ORDER — LIDOCAINE HCL (PF) 1 % IJ SOLN
INTRAMUSCULAR | Status: DC | PRN
  Administered 2016-07-26: 10 mL

## 2016-07-26 MED ORDER — INSULIN ASPART 100 UNIT/ML ~~LOC~~ SOLN
0.0000 [IU] | Freq: Three times a day (TID) | SUBCUTANEOUS | Status: DC
  Administered 2016-07-26: 7 [IU] via SUBCUTANEOUS
  Administered 2016-07-26: 1 [IU] via SUBCUTANEOUS
  Administered 2016-07-26: 3 [IU] via SUBCUTANEOUS
  Administered 2016-07-27: 9 [IU] via SUBCUTANEOUS

## 2016-07-26 MED ORDER — LIDOCAINE HCL 1 % IJ SOLN
INTRAMUSCULAR | Status: AC
  Filled 2016-07-26: qty 20

## 2016-07-26 MED ORDER — CEFAZOLIN SODIUM-DEXTROSE 2-4 GM/100ML-% IV SOLN
INTRAVENOUS | Status: AC
  Filled 2016-07-26: qty 100

## 2016-07-26 MED ORDER — CALCIUM CARBONATE-VITAMIN D 500-200 MG-UNIT PO TABS
2.0000 | ORAL_TABLET | Freq: Four times a day (QID) | ORAL | Status: DC
  Administered 2016-07-26 (×4): 2 via ORAL
  Filled 2016-07-26: qty 1
  Filled 2016-07-26 (×5): qty 2

## 2016-07-26 MED ORDER — SODIUM BICARBONATE 8.4 % IV SOLN
INTRAVENOUS | Status: DC
  Administered 2016-07-26 – 2016-07-27 (×2): via INTRAVENOUS
  Filled 2016-07-26 (×5): qty 150

## 2016-07-26 MED ORDER — INSULIN GLARGINE 100 UNIT/ML ~~LOC~~ SOLN
8.0000 [IU] | Freq: Every day | SUBCUTANEOUS | Status: DC
  Administered 2016-07-26 – 2016-07-27 (×2): 8 [IU] via SUBCUTANEOUS
  Filled 2016-07-26 (×2): qty 0.08

## 2016-07-26 NOTE — Progress Notes (Signed)
Dr. Grandville Silos paged about critical calcium level of 5.6. BP re-checked manually per his request. 138/58.

## 2016-07-26 NOTE — Progress Notes (Signed)
Patient to IR for IJ PICC placement.  Concern for possible contamination.  Patient received 2g Ancef via forearm IV.  After infusion complete, patient complained of discomfort in her arm.  Patient found to have infiltrate of her IV.   IV removed.   Ice applied to arm.   Discussed with Dr. Anselm Pancoast.  Based on assessment, patient has likely received Ancef with some remaining in subcutaneous tissue.  No need for additional antibiotics.   RN notified floor RN.  Patient returned to unit.   Brynda Greathouse, MMS RDN PA-C 2:35 PM

## 2016-07-26 NOTE — Consult Note (Signed)
Referring Provider: No ref. provider found Primary Care Physician:  Glendale Chard, MD Primary Nephrologist:    Reason for Consultation:  Hypocalcemia  Metabolic acidosis  Hypotension  HPI: Patient is 83 year old lady with coronary artery disease, hypertension, history of CVA with residual right-sided weakness and dysarthria, status post PPM, insulin-dependent diabetes mellitus presented to the ED with a three-day history of generalized weakness decreased oral intake and feeling very poorly. When EMS got to patient's home systolic blood pressures when the 80s and blood sugars in the 500s. Patient was admitted for hyperosmolar nonketotic hyperglycemia. Patient placed on the glucose stabilizer. Patient also noted to be significantly hypocalcemic with corrected calciums of 6.56. Nephrology consulted.  Past Medical History:  Diagnosis Date  . Anxiety   . Arthritis    "everywhere"  . Cataract    "both eyes; blood pressure's always too high to have them fixed" (09/11/2014)  . Chronic lower back pain   . Claudication in peripheral vascular disease ([place])    02/16/10: Left CIA 9.0x28 Omnilink and REIA 8.0x40 seff expanding Zilver.   right subclavian artery stent 03/18/2008,  . Coronary artery disease   . Herniated lumbar intervertebral disc   . Hypertension   . IDDM (insulin dependent diabetes mellitus) ([place])   . Migraine    "used to have terrible migraines; stopped in the 1990's"  . Pacemaker   . Peripheral vascular disease ([place])   . Pneumonia X 2  . Renal disorder   . Renovascular hypertension     s/p left renal artery stent 12/2007.  S/P balloon angioplasty on 02/16/10 for ISR, BP was  controlled well since then.     . Stroke [facility]) 1999   Stroke and TIA in 1999 with right sided weakness, now with residual right arm weakness (09/11/2014)  . UTI (lower urinary tract infection) 02/2015    Past Surgical History:  Procedure Laterality Date  . ABDOMINAL ANGIOGRAM N/A 07/06/2011   Procedure:  ABDOMINAL ANGIOGRAM;  Surgeon: Laverda Page, MD;  Location: [facility] CATH LAB;  Service: Cardiovascular;  Laterality: N/A;  . APPENDECTOMY    . BACK SURGERY    . CAROTID ENDARTERECTOMY Left 1991    Stroke and TIA in 1999 with right sided weakness, now with residual right arm weakness  . CARPAL TUNNEL RELEASE Left   . COLONOSCOPY  07/30/2011   Procedure: COLONOSCOPY;  Surgeon: Inda Castle, MD;  Location: [place];  Service: Endoscopy;  Laterality: N/A;  gi bleed  . ESOPHAGOGASTRODUODENOSCOPY  07/30/2011   Procedure: ESOPHAGOGASTRODUODENOSCOPY (EGD);  Surgeon: Inda Castle, MD;  Location: [place];  Service: Endoscopy;  Laterality: N/A;  . EXCISIONAL HEMORRHOIDECTOMY    . INSERT / REPLACE / REMOVE PACEMAKER    . LOWER EXTREMITY ANGIOGRAM Right 05/29/2012   Procedure: LOWER EXTREMITY ANGIOGRAM;  Surgeon: Laverda Page, MD;  Location: [facility] CATH LAB;  Service: Cardiovascular;  Laterality: Right;  . LUMBAR LAMINECTOMY     'herniated disc"  . PERIPHERAL VASCULAR CATHETERIZATION N/A 11/05/2014   Procedure: Abdominal Aortogram;  Surgeon: Serafina Mitchell, MD;  Location: [facility] CV LAB;  Service: Cardiovascular;  Laterality: N/A;  . PERMANENT PACEMAKER INSERTION Left 06/28/2011   Procedure: PERMANENT PACEMAKER INSERTION;  Surgeon: Deboraha Sprang, MD;  Location: [facility] CATH LAB;  Service: Cardiovascular;  Laterality: Left;  . RENAL ANGIOGRAM N/A 07/06/2011   Procedure: RENAL ANGIOGRAM;  Surgeon: Laverda Page, MD;  Location: [facility] CATH LAB;  Service: Cardiovascular;  Laterality: N/A;  . TONSILLECTOMY    .  URETERAL STENT PLACEMENT    . VAGINAL HYSTERECTOMY      Prior to Admission medications   Medication Sig Start Date End Date Taking? Authorizing Provider  acetaminophen (TYLENOL) 325 MG tablet Take 2 tablets (650 mg total) by mouth every 4 (four) hours as needed for headache or mild pain. 01/15/16  Yes Regalado, Belkys A, MD  aspirin 325 MG tablet Take 325 mg by mouth daily.   Yes  [provider]  clopidogrel (PLAVIX) 75 MG tablet Take 75 mg by mouth daily.   Yes [provider]  furosemide (LASIX) 20 MG tablet Take 20 mg by mouth 2 (two) times daily.   Yes [provider]  Garlic 503 MG CAPS Take 500 mg by mouth daily.    Yes [provider]  insulin NPH-regular Human (NOVOLIN 70/30) (70-30) 100 UNIT/ML injection Inject 5 Units into the skin 2 (two) times daily. 10/30/15  Yes Short, Noah Delaine, MD  isosorbide mononitrate (IMDUR) 120 MG 24 hr tablet Take 1 tablet (120 mg total) by mouth daily. 12/12/12  Yes Annita Brod, MD  metoprolol tartrate (LOPRESSOR) 25 MG tablet Take 25 mg by mouth 2 (two) times daily.   Yes [provider]  Omega-3 Fatty Acids (FISH OIL) 1000 MG CAPS Take 1,000 mg by mouth 2 (two) times daily.    Yes [provider]  [place] Calcium 500 MG TABS Take 500 mg by mouth.   Yes [provider]    Current Facility-Administered Medications  Medication Dose Route Frequency Provider Last Rate Last Dose  . 0.9 %  sodium chloride infusion   Intravenous Continuous Eugenie Filler, MD   Stopped at 07/25/16 1452  . 0.9 %  sodium chloride infusion   Intravenous Continuous Eugenie Filler, MD   Stopped at 07/25/16 2100  . acetaminophen (TYLENOL) tablet 650 mg  650 mg Oral Q4H PRN Eugenie Filler, MD      . aspirin tablet 325 mg  325 mg Oral Daily Eugenie Filler, MD   325 mg at 07/26/16 5465  . calcium gluconate 2 g in sodium chloride 0.9 % 100 mL IVPB  2 g Intravenous Once Eugenie Filler, MD      . calcium-vitamin D (OSCAL WITH D) 500-200 MG-UNIT per tablet 2 tablet  2 tablet Oral QID Eugenie Filler, MD   2 tablet at 07/26/16 [phone]  . clopidogrel (PLAVIX) tablet 75 mg  75 mg Oral Daily Eugenie Filler, MD   75 mg at 07/26/16 0852  . heparin injection 5,000 Units  5,000 Units Subcutaneous Q8H Eugenie Filler, MD   5,000 Units at 07/26/16 [phone]  . insulin aspart (novoLOG)  injection 0-9 Units  0-9 Units Subcutaneous TID WC Eugenie Filler, MD   1 Units at 07/26/16 1106  . insulin glargine (LANTUS) injection 8 Units  8 Units Subcutaneous Daily Eugenie Filler, MD   8 Units at 07/26/16 1106  . insulin regular (NOVOLIN R,HUMULIN R) 100 Units in sodium chloride 0.9 % 100 mL (1 Units/mL) infusion   Intravenous Continuous Eugenie Filler, MD   Stopped at 07/26/16 [phone]  . metoprolol tartrate (LOPRESSOR) tablet 25 mg  25 mg Oral BID Eugenie Filler, MD   25 mg at 07/26/16 4944  . omega-3 acid ethyl esters (LOVAZA) capsule 1 g  1 g Oral BID Eugenie Filler, MD   1 g at 07/26/16 [phone]  . ondansetron (ZOFRAN) injection 4 mg  4 mg Intravenous  Q6H PRN Eugenie Filler, MD      . oxyCODONE-acetaminophen (PERCOCET/ROXICET) 5-325 MG per tablet 1-2 tablet  1-2 tablet Oral Q4H PRN Eugenie Filler, MD      . sodium bicarbonate 150 mEq in dextrose 5 % 1,000 mL infusion   Intravenous Continuous Eugenie Filler, MD      . sorbitol 70 % solution 30 mL  30 mL Oral Daily PRN Eugenie Filler, MD        Allergies as of 07/25/2016 - Review Complete 07/25/2016  Allergen Reaction Noted  . Norvasc [amlodipine besylate] Swelling 04/03/2011  . Peanut-containing drug products Other (See Comments) 07/20/2011    Family History  Problem Relation Age of Onset  . Cancer Father        Colon cancer  . Heart attack Mother     Social History   Social History  . Marital status: Married    Spouse name: N/A  . Number of children: N/A  . Years of education: N/A   Occupational History  . Not on file.   Social History Main Topics  . Smoking status: Former Smoker    Packs/day: 0.00    Years: 70.00    Types: Cigarettes  . Smokeless tobacco: Never Used     Comment: 09/11/2014 "I used to smoke very heavy; down to 1 cigarette/day"  . Alcohol use No     Comment: "stopped drinking back in the 1970's"  . Drug use: No  . Sexual activity: No   Other Topics Concern  . Not  on file   Social History Narrative  . No narrative on file    Review of Systems: Gen: Denies any fever, chills, sweats, anorexia, fatigue, weakness, malaise, weight loss, and sleep disorder HEENT: No visual complaints, No history of Retinopathy. Normal external appearance No Epistaxis or Sore throat. No sinusitis.   CV: Denies chest pain, angina, palpitations, syncope, orthopnea, PND, peripheral edema, and claudication. Resp: Denies dyspnea at rest, dyspnea with exercise, cough, sputum, wheezing, coughing up blood, and pleurisy. GI: Denies vomiting blood, jaundice, and fecal incontinence.   Denies dysphagia or odynophagia. GU : Denies urinary burning, blood in urine, urinary frequency, urinary hesitancy, nocturnal urination, and urinary incontinence.  No renal calculi. MS: Denies joint pain, limitation of movement, and swelling, stiffness, low back pain, extremity pain. Denies muscle weakness, cramps, atrophy.  No use of non steroidal antiinflammatory drugs. Derm: Denies rash, itching, dry skin, hives, moles, warts, or unhealing ulcers.  Psych: Denies depression, anxiety, memory loss, suicidal ideation, hallucinations, paranoia, and confusion. Heme: Denies bruising, bleeding, and enlarged lymph nodes. Neuro: No headache.  No diplopia. No dysarthria.  No dysphasia.  No history of CVA.  No Seizures. No paresthesias.  No weakness. Endocrine No DM.  No Thyroid disease.  No Adrenal disease.  Physical Exam: Vital signs in last 24 hours: Temp:  [97.6 F (36.4 C)-98.6 F (37 C)] 97.8 F (36.6 C) (05/21 0805) Pulse Rate:  [25-85] 60 (05/21 0805) Resp:  [9-23] 13 (05/21 0805) BP: (88-160)/(55-119) 138/58 (05/21 0805) SpO2:  [92 %-100 %] 97 % (05/21 0419) Last BM Date: 07/26/16 General:    Elderly non distressed  Head:  Normocephalic and atraumatic. Eyes:  Sclera clear, no icterus.   Conjunctiva pink. Ears:  Normal auditory acuity. Nose:  No deformity, discharge,  or lesions. Mouth:  No  deformity or lesions, dentition normal. Neck:  Supple; no masses or thyromegaly. JVP not elevated Lungs:  Clear throughout to auscultation.   No  wheezes, crackles, or rhonchi. No acute distress. Heart:  Regular rate and rhythm; no murmurs, clicks, rubs,  or gallops. Abdomen:  Soft, nontender and nondistended. No masses, hepatosplenomegaly or hernias noted. Normal bowel sounds, without guarding, and without rebound.   Msk:  Symmetrical without gross deformities. Normal posture. Pulses:  No carotid, renal, femoral bruits. DP and PT symmetrical and equal Extremities:  Without clubbing or edema. Neurologic:  Alert and  oriented x4;  grossly normal neurologically. Skin:  Intact without significant lesions or rashes. Cervical Nodes:  No significant cervical adenopathy. Psych:  Alert and cooperative. Normal mood and affect.  Intake/Output from previous day: 05/20 0701 - 05/21 0700 In: 1903.5 [I.V.:904.5; IV Piggyback:999] Out: -  Intake/Output this shift: No intake/output data recorded.  Lab Results:  Recent Labs  07/25/16 1026 07/25/16 1034  WBC 6.4  --   HGB 15.8* 17.3*  HCT 45.2 51.0*  PLT 200  --    BMET  Recent Labs  07/25/16 1732 07/25/16 2106 07/26/16 0141 07/26/16 0650  NA 131* 131* 131* 130*  K 3.7 4.5 3.9 4.0  CL 103 103 105 103  CO2 16* 13* 17* 15*  GLUCOSE 131* 151* 150* 187*  BUN 60* 59* 57* 56*  CREATININE 2.10* 2.08* 2.02* 1.88*  CALCIUM 6.1* 6.0* 5.9* 5.6*  PHOS 6.0*  --   --   --    LFT  Recent Labs  07/25/16 1341  PROT 6.0*  ALBUMIN 2.8*  AST 40  ALT 56*  ALKPHOS 100  BILITOT 0.4   PT/INR No results for input(s): LABPROT, INR in the last 72 hours. Hepatitis Panel No results for input(s): HEPBSAG, HCVAB, HEPAIGM, HEPBIGM in the last 72 hours.  Studies/Results: Dg Chest 2 View  Result Date: 07/25/2016 CLINICAL DATA:  Weakness. Pt denies SOB and chest pain. Hx of CAD, HTN, pneumonia, stroke. Pt unable to extend right arm at all due to right  sided weakness from previous stroke. EXAM: CHEST  2 VIEW COMPARISON:  03/27/2016 FINDINGS: Cardiac silhouette is normal in size. No mediastinal or hilar masses. No evidence of adenopathy. Left anterior chest wall sequential pacemaker is stable and well positioned. There are prominent bronchovascular markings. There is no evidence of pneumonia or pulmonary edema. No pleural effusion or pneumothorax. Skeletal structures are demineralized but grossly intact. There arthropathic changes of the right shoulder, stable. IMPRESSION: No acute cardiopulmonary disease. Electronically Signed   By: Lajean Manes M.D.   On: 07/25/2016 10:51   Ct Head Wo Contrast  Result Date: 07/25/2016 CLINICAL DATA:  Confusion.  Altered mental status. EXAM: CT HEAD WITHOUT CONTRAST TECHNIQUE: Contiguous axial images were obtained from the base of the skull through the vertex without intravenous contrast. COMPARISON:  10/28/2015 FINDINGS: Brain: There is atrophy and chronic small vessel disease changes. Old lacunar infarct in the left basal ganglia. No acute intracranial abnormality. Specifically, no hemorrhage, hydrocephalus, mass lesion, acute infarction, or significant intracranial injury. Vascular: No hyperdense vessel or unexpected calcification. Skull: No acute calvarial abnormality. Sinuses/Orbits: Visualized paranasal sinuses and mastoids clear. Orbital soft tissues unremarkable. Other: None IMPRESSION: No acute intracranial abnormality. Atrophy, chronic microvascular disease. Electronically Signed   By: Rolm Baptise M.D.   On: 07/25/2016 14:21    Assessment/Plan:  Chronic renal disease baseline creatinine about 1.5 - 1.7    Renal ultrasound 11/16 showed echogenic kidney    Creatinine has increased slightly possible related to some hypotension  , she appears to be volume depleted on admission and blood pressure improved with hydration  HTN controlled  BP better no antihypertensives  Patient on beta blockers and isosorbide as an  outpatient  Hypocalcemia  Most likely due to low vitamin D or altered metabolism  Agree with oral calcium and vit D  Metabolic acidosis  Long standing  May have RTA   Replete with bicarbonate   LOS: 1 Kimbree Casanas W @TODAY @11 :24 AM

## 2016-07-26 NOTE — Progress Notes (Signed)
Patient ID: Carmen [name], female   DOB: 1934-02-13, 83 y.o.   MRN: [id number]   Right IJ dual lumen central line was placed today in IR at 1239 pm  See note: pts hair fell into sterile field and contaminated initial catheter- new catheter and re sterilized field were prepped. New catheter placement performed. Ancef 2Gr IV was given as prophylaxis.  Left forearm antecubital IV infiltrated with the Ancef infusion Exam was performed immediately by Rad Dr Anselm Pancoast and IR PA  Ice and elevation post procedure  Now at 1615 I have re examined pts arm Skin is intact; no redness noted No swelling;no blistering of skin Bandage is clean and dry at previous IV site. 2+ pulses FROM Good sensation  Pt denies pain or itching.  Reassurance Pt to let RN know if any change in skin site.

## 2016-07-26 NOTE — Progress Notes (Signed)
Pt had left posterior forearm IV infiltrated after infusion of ancef 2g. Pt denies pain, site is cool to touch and swollen. Discontinued IV and applied ice pack. MD aware and assessed pt.

## 2016-07-26 NOTE — Progress Notes (Signed)
PROGRESS NOTE    Carmen [name]  IBB:[id number] DOB: 30-Oct-1933 DOA: 07/25/2016 PCP: Glendale Chard, MD   Brief Narrative:  Patient is 83 year old lady with coronary artery disease, hypertension, history of CVA with residual right-sided weakness and dysarthria, status post PPM, insulin-dependent diabetes mellitus presented to the ED with a three-day history of generalized weakness decreased oral intake and feeling very poorly. When EMS got to patient's home systolic blood pressures when the 80s and blood sugars in the 500s. Patient was admitted for hyperosmolar nonketotic hyperglycemia. Patient placed on the glucose stabilizer. Patient also noted to be significantly hypocalcemic with corrected calciums of 6.56. Nephrology consulted.   Assessment & Plan:   Principal Problem:   Type 2 diabetes mellitus with hyperosmolar nonketotic hyperglycemia (HCC) Active Problems:   Acute metabolic encephalopathy   Tobacco abuse   DM (diabetes mellitus), type 2, uncontrolled, periph vascular complic (HCC)   History of CVA (cerebrovascular accident) with associated mild right upper extremity hemiplegia   Cerebral infarction ([place])   CKD (chronic kidney disease), stage III   Dyslipidemia   Weakness generalized   Dehydration, moderate   Acute on chronic renal failure (HCC)   Hyperglycemia   Hypocalcemia  #1 type 2 diabetes mellitus with hyperosmolar nonketotic hyperglycemia Questionable etiology. Urinalysis negative for UTI. Chest x-ray negative for any acute abnormalities. Patient with minimally elevated troponins which seem to be plateauing. Patient denies any chest pain no shortness of breath. Patient noted to have an anion gap of 15 with a bicarbonate of 19. Blood sugar of 479. Blood cultures pending. Hemoglobin A1c pending. Cultures were no growth to date. CBGs have ranged from 149 -188. Anion gap closed and 12. Will place on Lantus 8 units subcutaneously daily and overlap with insulin drip. Continue  IV fluids. Place on carb modified diet. SLP pending. Follow.   #2 acute metabolic encephalopathy Patient did have an episode in the emergency room with patient was noted to have some slurred speech eyes gaze to the left lasting less than 5 minutes. Patient was able to follow commands at that time. Patient did not have any focal neurological deficits other than right residual weakness from prior CVA. CT head negative. Unable to get a MRI secondary to pacemaker. Likely secondary to problem #1. Patient has been pancultured. Continue IV fluids. Glucose stabilizer. Follow.  #3 acute on chronic kidney disease stage III  baseline creatinine 1.4-1.8 Likely secondary to a prerenal azotemia in the setting of diuretics. When EMS arrived patient's home she was noted to have a systolic blood pressures in the 80s. Slowly improving with hydration.   #4 dehydration Likely secondary to decreased oral intake. IV fluids. Follow.  #5 hypocalcemia Corrected calcium is 6.56. ?? Etiology. Likely secondary to malabsorption versus secondary to chronic kidney disease versus vitamin D deficiency. Magnesium levels within normal limits. Phosphorus level elevated. Check a magnesium level, phosphorus level, PTH, ionized calcium, vitamin D 25-dihydroxy, 1, 25 hydroxy vitamin D. We'll give a dose of calcium gluconate 2 g IV 1. Patient on oral calcium citrate with vitamin D 4 times a day. Follow.  #6 metabolic acidosis Secondary to problem #1. Will place on bicarbonate infusion.  #7 minimally elevated troponins Cardiac enzymes trended back down. Patient denies any ongoing chest pain. Continue home regimen of Coreg.  #8 generalized weakness/failure to thrive May be secondary to problem #1. Patient also with poor oral intake likely a failure to thrive. PT/OT. If no significant improvement may need to consider palliative care evaluation for goals of  care.     DVT prophylaxis: Heparin Code Status: DO NOT  RESUSCITATE Family Communication: Updated patient and daughter at bedside. Disposition Plan: Likely home with home health once medically stable with significant improvement with hypocalcemia.   Consultants:   Nephrology pending  Procedures:   CT head without contrast 07/25/2016  Chest x-ray 07/25/2016    Antimicrobials:   None   Subjective: Patient in bed. Patient states she feels better. Patient more alert. No chest pain. No shortness of breath. Patient denies any oral parathesias. Patient denies any tingling.  Objective: Vitals:   07/25/16 2343 07/26/16 0000 07/26/16 0419 07/26/16 0805  BP: 128/86 119/68 (!) 88/59 (!) 138/58  Pulse: 68 (!) 25 62 60  Resp: (!) 21 (!) 9 10 13   Temp: 98.6 F (37 C)  97.6 F (36.4 C) 97.8 F (36.6 C)  TempSrc: Oral  Oral Oral  SpO2: 98% 100% 97%   Weight:      Height:        Intake/Output Summary (Last 24 hours) at 07/26/16 1106 Last data filed at 07/26/16 0600  Gross per 24 hour  Intake          1903.46 ml  Output                0 ml  Net          1903.46 ml   Filed Weights   07/25/16 1023  Weight: 40.8 kg (90 lb)    Examination:  General exam: Appears calm and comfortable  Respiratory system: Clear to auscultation. Respiratory effort normal. Cardiovascular system: S1 & S2 heard, RRR. No JVD, murmurs, rubs, gallops or clicks. No pedal edema. Gastrointestinal system: Abdomen is nondistended, soft and nontender. No organomegaly or masses felt. Normal bowel sounds heard. Central nervous system: Alert and oriented. No focal neurological deficits. Extremities: Symmetric 5 x 5 power. Skin: No rashes, lesions or ulcers Psychiatry: Judgement and insight appear normal. Mood & affect appropriate.     Data Reviewed: I have personally reviewed following labs and imaging studies  CBC:  Recent Labs Lab 07/25/16 1026 07/25/16 1034  WBC 6.4  --   NEUTROABS 4.9  --   HGB 15.8* 17.3*  HCT 45.2 51.0*  MCV 89.2  --   PLT 200   --    Basic Metabolic Panel:  Recent Labs Lab 07/25/16 1341 07/25/16 1732 07/25/16 2106 07/26/16 0141 07/26/16 0650  NA 132* 131* 131* 131* 130*  K 3.8 3.7 4.5 3.9 4.0  CL 102 103 103 105 103  CO2 17* 16* 13* 17* 15*  GLUCOSE 242* 131* 151* 150* 187*  BUN 60* 60* 59* 57* 56*  CREATININE 2.15* 2.10* 2.08* 2.02* 1.88*  CALCIUM 6.2* 6.1* 6.0* 5.9* 5.6*  MG 2.0 2.0  --   --   --   PHOS  --  6.0*  --   --   --    GFR: Estimated Creatinine Clearance: 15.4 mL/min (A) (by C-G formula based on SCr of 1.88 mg/dL (H)). Liver Function Tests:  Recent Labs Lab 07/25/16 1341  AST 40  ALT 56*  ALKPHOS 100  BILITOT 0.4  PROT 6.0*  ALBUMIN 2.8*   No results for input(s): LIPASE, AMYLASE in the last 168 hours. No results for input(s): AMMONIA in the last 168 hours. Coagulation Profile: No results for input(s): INR, PROTIME in the last 168 hours. Cardiac Enzymes:  Recent Labs Lab 07/25/16 1026 07/25/16 1341 07/25/16 2105 07/26/16 0141  TROPONINI 0.06* 0.06* 0.07* 0.05*  BNP (last 3 results) No results for input(s): PROBNP in the last 8760 hours. HbA1C:  Recent Labs  07/25/16 1732  HGBA1C >15.5*   CBG:  Recent Labs Lab 07/26/16 0546 07/26/16 0659 07/26/16 0755 07/26/16 0814 07/26/16 0855  GLUCAP 176* 188* 191* 184* 149*   Lipid Profile: No results for input(s): CHOL, HDL, LDLCALC, TRIG, CHOLHDL, LDLDIRECT in the last 72 hours. Thyroid Function Tests: No results for input(s): TSH, T4TOTAL, FREET4, T3FREE, THYROIDAB in the last 72 hours. Anemia Panel: No results for input(s): VITAMINB12, FOLATE, FERRITIN, TIBC, IRON, RETICCTPCT in the last 72 hours. Sepsis Labs: No results for input(s): PROCALCITON, LATICACIDVEN in the last 168 hours.  Recent Results (from the past 240 hour(s))  Urine culture     Status: None (Preliminary result)   Collection Time: 07/25/16 12:50 PM  Result Value Ref Range Status   Specimen Description URINE, RANDOM  Final   Special  Requests NONE  Final   Culture PENDING  Incomplete   Report Status PENDING  Incomplete  Culture, blood (Routine X 2) w Reflex to ID Panel     Status: None (Preliminary result)   Collection Time: 07/25/16  1:40 PM  Result Value Ref Range Status   Specimen Description BLOOD LEFT WRIST  Final   Special Requests   Final    BOTTLES DRAWN AEROBIC ONLY Blood Culture adequate volume   Culture NO GROWTH < 24 HOURS  Final   Report Status PENDING  Incomplete  Culture, blood (Routine X 2) w Reflex to ID Panel     Status: None (Preliminary result)   Collection Time: 07/25/16  1:47 PM  Result Value Ref Range Status   Specimen Description BLOOD LEFT HAND  Final   Special Requests IN PEDIATRIC BOTTLE Blood Culture adequate volume  Final   Culture NO GROWTH < 24 HOURS  Final   Report Status PENDING  Incomplete  MRSA PCR Screening     Status: None   Collection Time: 07/25/16  8:23 PM  Result Value Ref Range Status   MRSA by PCR NEGATIVE NEGATIVE Final    Comment:        The GeneXpert MRSA Assay (FDA approved for NASAL specimens only), is one component of a comprehensive MRSA colonization surveillance program. It is not intended to diagnose MRSA infection nor to guide or monitor treatment for MRSA infections.          Radiology Studies: Dg Chest 2 View  Result Date: 07/25/2016 CLINICAL DATA:  Weakness. Pt denies SOB and chest pain. Hx of CAD, HTN, pneumonia, stroke. Pt unable to extend right arm at all due to right sided weakness from previous stroke. EXAM: CHEST  2 VIEW COMPARISON:  03/27/2016 FINDINGS: Cardiac silhouette is normal in size. No mediastinal or hilar masses. No evidence of adenopathy. Left anterior chest wall sequential pacemaker is stable and well positioned. There are prominent bronchovascular markings. There is no evidence of pneumonia or pulmonary edema. No pleural effusion or pneumothorax. Skeletal structures are demineralized but grossly intact. There arthropathic changes  of the right shoulder, stable. IMPRESSION: No acute cardiopulmonary disease. Electronically Signed   By: Lajean Manes M.D.   On: 07/25/2016 10:51   Ct Head Wo Contrast  Result Date: 07/25/2016 CLINICAL DATA:  Confusion.  Altered mental status. EXAM: CT HEAD WITHOUT CONTRAST TECHNIQUE: Contiguous axial images were obtained from the base of the skull through the vertex without intravenous contrast. COMPARISON:  10/28/2015 FINDINGS: Brain: There is atrophy and chronic small vessel disease changes. Old  lacunar infarct in the left basal ganglia. No acute intracranial abnormality. Specifically, no hemorrhage, hydrocephalus, mass lesion, acute infarction, or significant intracranial injury. Vascular: No hyperdense vessel or unexpected calcification. Skull: No acute calvarial abnormality. Sinuses/Orbits: Visualized paranasal sinuses and mastoids clear. Orbital soft tissues unremarkable. Other: None IMPRESSION: No acute intracranial abnormality. Atrophy, chronic microvascular disease. Electronically Signed   By: Rolm Baptise M.D.   On: 07/25/2016 14:21        Scheduled Meds: . aspirin  325 mg Oral Daily  . calcium-vitamin D  2 tablet Oral QID  . clopidogrel  75 mg Oral Daily  . heparin  5,000 Units Subcutaneous Q8H  . insulin aspart  0-9 Units Subcutaneous TID WC  . insulin glargine  8 Units Subcutaneous Daily  . metoprolol tartrate  25 mg Oral BID  . omega-3 acid ethyl esters  1 g Oral BID   Continuous Infusions: . sodium chloride Stopped (07/25/16 1452)  . sodium chloride Stopped (07/25/16 2100)  . calcium gluconate    . insulin (NOVOLIN-R) infusion Stopped (07/26/16 0952)  .  sodium bicarbonate  infusion 1000 mL       LOS: 1 day    Time spent: 35 minutes    Carmen Hawkey, MD [facility] Pager [phone]  If 7PM-7AM, please contact night-coverage [email or web address] Password [email or web address] 07/26/2016, 11:06 AM

## 2016-07-26 NOTE — Procedures (Signed)
Placement of right jugular central line.  Tip at SVC/RA junction.  Procedure was complicated by contamination of the sterile field by patient's hair.  As a result, the initial access was completely removed and new access obtained.  Patient also given 2 g of Ancef.  Minimal bleeding.

## 2016-07-26 NOTE — Evaluation (Signed)
Clinical/Bedside Swallow Evaluation Patient Details  Name: Carmen [name] MRN: [id number] Date of Birth: 05/15/1933  Today's Date: 07/26/2016 Time: SLP Start Time (ACUTE ONLY): 0930 SLP Stop Time (ACUTE ONLY): 0953 SLP Time Calculation (min) (ACUTE ONLY): 23 min  Past Medical History:  Past Medical History:  Diagnosis Date  . Anxiety   . Arthritis    "everywhere"  . Cataract    "both eyes; blood pressure's always too high to have them fixed" (09/11/2014)  . Chronic lower back pain   . Claudication in peripheral vascular disease ([place])    02/16/10: Left CIA 9.0x28 Omnilink and REIA 8.0x40 seff expanding [place].   right subclavian artery stent 03/18/2008,  . Coronary artery disease   . Herniated lumbar intervertebral disc   . Hypertension   . IDDM (insulin dependent diabetes mellitus) ([place])   . Migraine    "used to have terrible migraines; stopped in the 1990's"  . Pacemaker   . Peripheral vascular disease ([place])   . Pneumonia X 2  . Renal disorder   . Renovascular hypertension     s/p left renal artery stent 12/2007.  S/P balloon angioplasty on 02/16/10 for ISR, BP was  controlled well since then.     . Stroke [facility]) 1999   Stroke and TIA in 1999 with right sided weakness, now with residual right arm weakness (09/11/2014)  . UTI (lower urinary tract infection) 02/2015   Past Surgical History:  Past Surgical History:  Procedure Laterality Date  . ABDOMINAL ANGIOGRAM N/A 07/06/2011   Procedure: ABDOMINAL ANGIOGRAM;  Surgeon: Laverda Page, MD;  Location: [facility] CATH LAB;  Service: Cardiovascular;  Laterality: N/A;  . APPENDECTOMY    . BACK SURGERY    . CAROTID ENDARTERECTOMY Left 1991    Stroke and TIA in 1999 with right sided weakness, now with residual right arm weakness  . CARPAL TUNNEL RELEASE Left   . COLONOSCOPY  07/30/2011   Procedure: COLONOSCOPY;  Surgeon: Inda Castle, MD;  Location: [place];  Service: Endoscopy;  Laterality: N/A;  gi bleed  .  ESOPHAGOGASTRODUODENOSCOPY  07/30/2011   Procedure: ESOPHAGOGASTRODUODENOSCOPY (EGD);  Surgeon: Inda Castle, MD;  Location: [place];  Service: Endoscopy;  Laterality: N/A;  . EXCISIONAL HEMORRHOIDECTOMY    . INSERT / REPLACE / REMOVE PACEMAKER    . LOWER EXTREMITY ANGIOGRAM Right 05/29/2012   Procedure: LOWER EXTREMITY ANGIOGRAM;  Surgeon: Laverda Page, MD;  Location: [facility] CATH LAB;  Service: Cardiovascular;  Laterality: Right;  . LUMBAR LAMINECTOMY     'herniated disc"  . PERIPHERAL VASCULAR CATHETERIZATION N/A 11/05/2014   Procedure: Abdominal Aortogram;  Surgeon: Serafina Mitchell, MD;  Location: [place] CV LAB;  Service: Cardiovascular;  Laterality: N/A;  . PERMANENT PACEMAKER INSERTION Left 06/28/2011   Procedure: PERMANENT PACEMAKER INSERTION;  Surgeon: Deboraha Sprang, MD;  Location[facility] CATH LAB;  Service: Cardiovascular;  Laterality: Left;  . RENAL ANGIOGRAM N/A 07/06/2011   Procedure: RENAL ANGIOGRAM;  Surgeon: Laverda Page, MD;  Location: [facility] CATH LAB;  Service: Cardiovascular;  Laterality: N/A;  . TONSILLECTOMY    . URETERAL STENT PLACEMENT    . VAGINAL HYSTERECTOMY     HPI:  Pt is an 83 yo F admitted for hyperglycemia, AMS, metabolic acidosis. During the ED course patient was noted to have an episode of altered mental status with patient was noted to have some slurred speech, eyes gaze to the left, which lasted less than 5 minutes and patient was back at baseline. Head  CT was done which was negative. CXR also clear. Pt was seen for BSE in 2016, normal swallow function at the time.    Assessment / Plan / Recommendation Clinical Impression  Pt demonstrates signs of a primary oral dysphagia. She does not have dentition and eats impulsively, filling her mouth with several bites before transiting. There is pocketed residual in the buccal cavities and accumulation of saliva, thin liquids in anterior sulcus. When pt takes sips while still masticating, she demonstrates hard  coughing, likely penetrating mixed consistencies with premature spillage. When drinking thin liquids in solation she takes small sips and tolerates with out signs of aspiration. Recommend pts food texture be softed and extra precautions taken for increased safety with intake. Pt will need full supervision  to slow rate, clear mouth of solids before taking sips. Discussed with RN, MD and granddaughter. Will f/u for tolerance.  SLP Visit Diagnosis: Dysphagia, oral phase (R13.11)    Aspiration Risk  Moderate aspiration risk    Diet Recommendation Dysphagia 2 (Fine chop);Thin liquid   Liquid Administration via: Cup;No straw Medication Administration: Whole meds with puree Supervision: Staff to assist with self feeding;Full supervision/cueing for compensatory strategies Compensations: Slow rate;Small sips/bites;Lingual sweep for clearance of pocketing;Minimize environmental distractions (no mixed textures) Postural Changes: Seated upright at 90 degrees;Remain upright for at least 30 minutes after po intake    Other  Recommendations Oral Care Recommendations: Oral care BID   Follow up Recommendations 24 hour supervision/assistance      Frequency and Duration min 2x/week  2 weeks       Prognosis Prognosis for Safe Diet Advancement: Fair      Swallow Study   General HPI: Pt is an 83 yo F admitted for hyperglycemia, AMS, metabolic acidosis. During the ED course patient was noted to have an episode of altered mental status with patient was noted to have some slurred speech, eyes gaze to the left, which lasted less than 5 minutes and patient was back at baseline. Head CT was done which was negative. CXR also clear. Pt was seen for BSE in 2016, normal swallow function at the time.  Type of Study: Bedside Swallow Evaluation Diet Prior to this Study: Regular;Thin liquids Temperature Spikes Noted: No Respiratory Status: Room air Behavior/Cognition: Alert;Cooperative;Requires cueing;Impulsive Oral  Cavity Assessment: Excessive secretions Oral Care Completed by SLP: No Oral Cavity - Dentition: Edentulous Vision: Functional for self-feeding Self-Feeding Abilities: Able to feed self Patient Positioning: Upright in bed Baseline Vocal Quality: Wet Volitional Cough: Strong Volitional Swallow: Able to elicit    Oral/Motor/Sensory Function Overall Oral Motor/Sensory Function: Generalized oral weakness Facial ROM: Within Functional Limits Facial Symmetry: Within Functional Limits Facial Strength: Within Functional Limits Facial Sensation: Within Functional Limits Lingual ROM: Within Functional Limits Lingual Symmetry: Within Functional Limits Lingual Strength: Reduced   Ice Chips Ice chips: Not tested   Thin Liquid Thin Liquid: Impaired Presentation: Cup;Self Fed;Straw Oral Phase Impairments: Reduced labial seal Oral Phase Functional Implications: Oral residue;Right anterior spillage    Nectar Thick Nectar Thick Liquid: Not tested   Honey Thick Honey Thick Liquid: Not tested   Puree Puree: Not tested   Solid   GO   Solid: Impaired Presentation: Self Fed Oral Phase Impairments: Impaired mastication;Reduced lingual movement/coordination Oral Phase Functional Implications: Right lateral sulci pocketing;Left lateral sulci pocketing;Prolonged oral transit;Impaired mastication;Oral residue Pharyngeal Phase Impairments: Cough - Immediate       Herbie [place], MA CCC-SLP [phone]  Lynann Beaver 07/26/2016,11:04 AM

## 2016-07-26 NOTE — Evaluation (Signed)
Physical Therapy Evaluation Patient Details Name: Carmen [name] MRN: [id number] DOB: Jul 15, 1933 Today's Date: 07/26/2016   History of Present Illness  Patient is a 83 y/o female who presents with generalized weakness, decreased oral intake, feeling poorly. EMS was called when pt found being tremulous and diaphoretic, Initial SBP was in the 80s with elevated blood sugars in the 500s. PMH includes DM, history of CVA with residual right-sided weakness and some dysarthria, CKD stage III-IV, diastolic CHF, SSS s/p pacemaker  Clinical Impression  Patient presents with generalized weakness, residual right sided weakness from prior CVA, balance deficits and impaired mobility s/p above. Pt is furniture walker at baseline. Today, requires Min A for balance/safety during gait training. Pt has support from spouse and children at home. BP stable today. Will follow acutely to maximize independence and mobility prior to return home.    Follow Up Recommendations Home health PT;Supervision for mobility/OOB    Equipment Recommendations  None recommended by PT    Recommendations for Other Services       Precautions / Restrictions Precautions Precautions: Fall Restrictions Weight Bearing Restrictions: No      Mobility  Bed Mobility Overal bed mobility: Needs Assistance Bed Mobility: Supine to Sit     Supine to sit: Min guard;HOB elevated     General bed mobility comments: Increased time to get to EOB with use of rail. No dizziness.  Transfers Overall transfer level: Needs assistance Equipment used: None Transfers: Sit to/from Stand Sit to Stand: Min assist         General transfer comment: Assist to stand from low toilet surface using rail to pull up; able to stand from EOB with Min guard assist for safety.  Ambulation/Gait Ambulation/Gait assistance: Min assist Ambulation Distance (Feet): 15 Feet (x2 bouts) Assistive device: 1 person hand held assist Gait Pattern/deviations:  Step-through pattern;Decreased stride length;Trunk flexed Gait velocity: decreased   General Gait Details: Slow, mildly unsteady gait with HHA for support, pt reaching for door, furniture for stability. Right knee instability but no buckling.  Stairs            Wheelchair Mobility    Modified Rankin (Stroke Patients Only)       Balance Overall balance assessment: Needs assistance Sitting-balance support: Feet supported;No upper extremity supported Sitting balance-Leahy Scale: Fair Sitting balance - Comments: Able to reach outside BoS and grab coffee cup from tray with LOB.   Standing balance support: During functional activity Standing balance-Leahy Scale: Fair Standing balance comment: Able to reach down and grab toilet paper without LOB and perform pericare without instability. Washing hands at sink with MIn guard for safety.                              Pertinent Vitals/Pain Pain Assessment: No/denies pain    Home Living Family/patient expects to be discharged to:: Private residence Living Arrangements: Spouse/significant other;Children Available Help at Discharge: Family;Available 24 hours/day (son works) Type of Home: House Home Access: Stairs to enter Entrance Stairs-Rails: Horticulturist, commercial of Steps: 2 Home Layout: One level Home Equipment: Environmental consultant - 2 wheels;Cane - single point;Bedside commode;Hand held shower head;Tub bench      Prior Function Level of Independence: Independent         Comments: Furniture walks at home. Has a SPC and RW if needed. Sometimes needs assist with bathing/dressing, not always.     Hand Dominance   Dominant Hand:  (but has become left  handed from residual deficits from CVA)    Extremity/Trunk Assessment   Upper Extremity Assessment Upper Extremity Assessment: Defer to OT evaluation (Residual weakness RUE from prior CVA with RUE in flexor synergy. Has hand function.)    Lower Extremity  Assessment Lower Extremity Assessment: Generalized weakness    Cervical / Trunk Assessment Cervical / Trunk Assessment: Kyphotic  Communication   Communication: No difficulties  Cognition Arousal/Alertness: Awake/alert Behavior During Therapy: WFL for tasks assessed/performed Overall Cognitive Status: Impaired/Different from baseline (Seems to be at baseline per granddaughter and patient.) Area of Impairment: Orientation                 Orientation Level: Disoriented to;Time             General Comments: Knew it was May but not the year.       General Comments General comments (skin integrity, edema, etc.): Granddaughter present during session. Pt with mild dysarthria which is baseline and difficulty managing secretions -premorbid.    Exercises     Assessment/Plan    PT Assessment Patient needs continued PT services  PT Problem List Decreased mobility;Decreased strength;Decreased activity tolerance;Decreased balance;Decreased cognition       PT Treatment Interventions Therapeutic activities;Gait training;Therapeutic exercise;Patient/family education;Balance training;Functional mobility training    PT Goals (Current goals can be found in the Care Plan section)  Acute Rehab PT Goals Patient Stated Goal: to go home PT Goal Formulation: With patient Time For Goal Achievement: 08/09/16 Potential to Achieve Goals: Good    Frequency Min 3X/week   Barriers to discharge Inaccessible home environment stairs to enter home    Co-evaluation               AM-PAC PT "6 Clicks" Daily Activity  Outcome Measure Difficulty turning over in bed (including adjusting bedclothes, sheets and blankets)?: None Difficulty moving from lying on back to sitting on the side of the bed? : None Difficulty sitting down on and standing up from a chair with arms (e.g., wheelchair, bedside commode, etc,.)?: Total Help needed moving to and from a bed to chair (including a  wheelchair)?: A Little Help needed walking in hospital room?: A Little Help needed climbing 3-5 steps with a railing? : A Little 6 Click Score: 18    End of Session Equipment Utilized During Treatment: Gait belt Activity Tolerance: Patient tolerated treatment well Patient left: in bed;with call bell/phone within reach;with family/visitor present Nurse Communication: Mobility status PT Visit Diagnosis: Unsteadiness on feet (R26.81);Muscle weakness (generalized) (M62.81)    Time: 0321-2248 PT Time Calculation (min) (ACUTE ONLY): 25 min   Charges:   PT Evaluation $PT Eval Low Complexity: 1 Procedure PT Treatments $Gait Training: 8-22 mins   PT G Codes:        Wray Kearns, PT, DPT [phone]    Marguarite Arbour A Crisanto Nied 07/26/2016, 10:49 AM

## 2016-07-26 NOTE — Progress Notes (Signed)
Glucometer not transferring over so CBGs are as follows:   2306: 153mg /dl 0053: 122mg /dl 0156: 132mg /dl 0312 : 141mg /dl 0413: 150 mg/dl 0546: 176 mg/dl  Glucometer is now working and most updated data has been transferred to results.

## 2016-07-26 NOTE — Progress Notes (Signed)
OT Cancellation Note  Patient Details Name: Carmen [name] MRN: [id number] DOB: September 06, 1933   Cancelled Treatment:    Reason Eval/Treat Not Completed: Patient at procedure or test/ unavailable pt off unit for central line placement. OT to reattempt at a more appropriate time.  Vonita Moss   OTR/L Pager: [phone] Office: [phone] .  07/26/2016, 2:01 PM

## 2016-07-26 NOTE — Progress Notes (Signed)
   Patient declined Advanced Directive (AD)  If/when patient decides to move forward w/ AD documentation, please page on-call chaplain or contact the spiritual care department.  Will follow, as needed.  - Rev. [place] MDiv ThM

## 2016-07-27 DIAGNOSIS — E876 Hypokalemia: Secondary | ICD-10-CM

## 2016-07-27 DIAGNOSIS — Z720 Tobacco use: Secondary | ICD-10-CM

## 2016-07-27 LAB — CBC
HEMATOCRIT: 36.6 % (ref 36.0–46.0)
Hemoglobin: 12.9 g/dL (ref 12.0–15.0)
MCH: 31.3 pg (ref 26.0–34.0)
MCHC: 35.2 g/dL (ref 30.0–36.0)
MCV: 88.8 fL (ref 78.0–100.0)
Platelets: 161 10*3/uL (ref 150–400)
RBC: 4.12 MIL/uL (ref 3.87–5.11)
RDW: 13 % (ref 11.5–15.5)
WBC: 4.8 10*3/uL (ref 4.0–10.5)

## 2016-07-27 LAB — RENAL FUNCTION PANEL
ALBUMIN: 2.3 g/dL — AB (ref 3.5–5.0)
ANION GAP: 8 (ref 5–15)
BUN: 38 mg/dL — AB (ref 6–20)
CHLORIDE: 99 mmol/L — AB (ref 101–111)
CO2: 27 mmol/L (ref 22–32)
Calcium: 6.7 mg/dL — ABNORMAL LOW (ref 8.9–10.3)
Creatinine, Ser: 1.54 mg/dL — ABNORMAL HIGH (ref 0.44–1.00)
GFR, EST AFRICAN AMERICAN: 36 mL/min — AB (ref >60)
GFR, EST NON AFRICAN AMERICAN: 31 mL/min — AB (ref >60)
Glucose, Bld: 92 mg/dL (ref 65–99)
PHOSPHORUS: 3.5 mg/dL (ref 2.5–4.6)
POTASSIUM: 3.1 mmol/L — AB (ref 3.5–5.1)
Sodium: 134 mmol/L — ABNORMAL LOW (ref 135–145)

## 2016-07-27 LAB — VITAMIN D 25 HYDROXY (VIT D DEFICIENCY, FRACTURES): VIT D 25 HYDROXY: 5.4 ng/mL — AB (ref 30.0–100.0)

## 2016-07-27 LAB — GLUCOSE, CAPILLARY
GLUCOSE-CAPILLARY: 111 mg/dL — AB (ref 65–99)
Glucose-Capillary: 359 mg/dL — ABNORMAL HIGH (ref 65–99)

## 2016-07-27 LAB — MAGNESIUM: Magnesium: 1.7 mg/dL (ref 1.7–2.4)

## 2016-07-27 MED ORDER — POTASSIUM CHLORIDE CRYS ER 20 MEQ PO TBCR
40.0000 meq | EXTENDED_RELEASE_TABLET | ORAL | Status: AC
  Administered 2016-07-27 (×2): 40 meq via ORAL
  Filled 2016-07-27 (×2): qty 2

## 2016-07-27 MED ORDER — ISOSORBIDE MONONITRATE ER 120 MG PO TB24: 120.0000 mg | ORAL_TABLET | Freq: Every day | ORAL | 0 refills | Status: DC

## 2016-07-27 MED ORDER — CHLORHEXIDINE GLUCONATE CLOTH 2 % EX PADS: 6.0000 | MEDICATED_PAD | Freq: Every day | CUTANEOUS | Status: DC

## 2016-07-27 MED ORDER — FUROSEMIDE 20 MG PO TABS: 20.0000 mg | ORAL_TABLET | Freq: Every day | ORAL | 0 refills | Status: DC

## 2016-07-27 MED ORDER — POTASSIUM CHLORIDE CRYS ER 20 MEQ PO TBCR: 20.0000 meq | EXTENDED_RELEASE_TABLET | Freq: Every day | ORAL | 0 refills | Status: DC

## 2016-07-27 MED ORDER — MAGNESIUM SULFATE 4 GM/100ML IV SOLN
4.0000 g | Freq: Once | INTRAVENOUS | Status: AC
  Administered 2016-07-27: 4 g via INTRAVENOUS
  Filled 2016-07-27: qty 100

## 2016-07-27 MED ORDER — CALCIUM CARBONATE-VITAMIN D 500-200 MG-UNIT PO TABS: 2.0000 | ORAL_TABLET | Freq: Four times a day (QID) | ORAL | Status: AC

## 2016-07-27 NOTE — Progress Notes (Signed)
Pt being discharged home via wheelchair with family. Pt alert and oriented x4. VSS. Pt c/o no pain at this time. No signs of respiratory distress. Education complete and care plans resolved. PIV x1 removed with no complications. No further issues at this time. Pt to follow up with PCP. Leanne Chang, RN

## 2016-07-27 NOTE — Progress Notes (Signed)
Inpatient Diabetes Program Recommendations  AACE/ADA: New Consensus Statement on Inpatient Glycemic Control (2015)  Target Ranges:  Prepandial:   less than 140 mg/dL      Peak postprandial:   less than 180 mg/dL (1-2 hours)      Critically ill patients:  140 - 180 mg/dL   Lab Results  Component Value Date   GLUCAP 359 (H) 07/27/2016   HGBA1C >15.5 (H) 07/25/2016    Review of Glycemic Control  Inpatient Diabetes Program Recommendations:   Spoke with pt and granddaughter @ bedside and husband by phone about A1C results 15.5 and explained what an A1C is, basic pathophysiology of DM Type 2, basic home care, basic diabetes diet nutrition principles, importance of checking CBGs and maintaining good CBG control to prevent long-term and short-term complications. Reviewed signs and symptoms of hyperglycemia and hypoglycemia and how to treat hypoglycemia at home. Also reviewed blood sugar goals at home.  Husband assists patient @ home with insulin. Husband states pt. Receives her insulin ac breakfast and evening. Reviewed unless other orders, to give insulin ac supper also. Husband shared that pt. Gets up all hrs and eats food with lots of sugar on it. Reviewed with pt. Need to control CBGs and limit sugar and sweets. Pt. Acknowledges understanding.  Thank you, Nani Gasser. Keo Schirmer, RN, MSN, CDE  Diabetes Coordinator Inpatient Glycemic Control Team Team Pager [phone] (8am-5pm) 07/27/2016 12:17 PM

## 2016-07-27 NOTE — Discharge Summary (Signed)
Physician Discharge Summary  Carmen [name] JHE:[id number] DOB: [birth year] DOA: 07/25/2016  PCP: Glendale Chard, MD  Admit date: 07/25/2016 Discharge date: 07/27/2016  Time spent: 65 minutes  Recommendations for Outpatient Follow-up:  1. Follow-up with Glendale Chard, MD in 1-2 weeks. On follow-up PTH levels will need to be followed up upon. Patient's vitamin D deficiency/hypocalcemia will need to be followed up upon. Reassessment of patient's diabetes will need to be done. Patient's blood pressure also need to be reassessed. Patient will need a basic metabolic profile and magnesium level done to follow-up on electrolytes and renal function.   Discharge Diagnoses:  Principal Problem:   Type 2 diabetes mellitus with hyperosmolar nonketotic hyperglycemia (HCC) Active Problems:   Acute metabolic encephalopathy   Hypokalemia   Tobacco abuse   DM (diabetes mellitus), type 2, uncontrolled, periph vascular complic (HCC)   History of CVA (cerebrovascular accident) with associated mild right upper extremity hemiplegia   Cerebral infarction ([facility])   CKD (chronic kidney disease), stage III   Dyslipidemia   Weakness generalized   Dehydration, moderate   Acute on chronic renal failure (HCC)   Hyperglycemia   Hypocalcemia   Discharge Condition: Stable and improved.  Diet recommendation: Heart healthy  Filed Weights   07/25/16 1023  Weight: 40.8 kg (90 lb)    History of present illness:  Per Dr.Nazanin Kinner  Carmen [name] is a 83 y.o. female with medical history significant of coronary artery disease, hypertension, history of CVA with residual right-sided weakness and some dysarthria, status post PPM, insulin-dependent diabetes mellitus, chronic kidney disease who presented to the ED with a three-day history of generalized weakness, decreased oral intake, feeling poorly. On the morning of admission patient was using the bathroom to urinate but took longer than usual and patient's granddaughter  went to check on her and stated that patient was diaphoretic and tremulousness. EMS was contacted and PED and granddaughter patient's initial systolic blood pressure was in the 80s with elevated blood sugars in the 500s. EMS was called to the patient's home the night prior to admission however patient refused to come to the ED. Patient was subsequently brought to the emergency room. Patient denied any fevers, no shortness of breath, no chest pain, no abdominal pain, no constipation, no diarrhea, no dysuria, no melena, no hematemesis, no hematochezia, no worsening asymmetric weakness, no cough. Patient did endorse generalized weakness. During the ED course patient was noted to have an episode of altered mental status with patient was noted to have some slurred speech, eyes gaze to the left, which lasted less than 5 minutes and patient was back at baseline. Head CT was done which was negative. ED PA discussed episode with neurology who felt it was likely metabolic in nature. [facility] were called to admit the patient for further evaluation and management.  ED Course: In the ED i-STAT 8 obtained at his sodium of 1:30 chloride of 96 glucose of 479 with a BUN of 56 and a creatinine of 2.30. First set of troponin was 0.06. EKG with LVH with repolarization with some T-wave versus irritated 1 aVL V4 unchanged from prior EKG. CBC done with a white count of 6.4 hemoglobin of 15.8 platelet count of 200. Urinalysis with greater than 500 glucose negative ketones negative leukocytes negative nitrites greater than 300 protein 0-5 WBCs. Chest x-ray is negative for any acute abnormalities. Patient was placed on the glucose stabilizer placed on IV fluids. CT head negative for any acute abnormalities.  Hospital Course:  #1  type 2 diabetes mellitus with hyperosmolar nonketotichyperglycemia Questionable etiology. Likely secondary to medication noncompliance. Urinalysis negative for UTI. Chest x-ray negative for any  acute abnormalities. Patient with minimally elevated troponins which seem to be plateauing. Patient denied any chest pain no shortness of breath. Patient noted to have an anion gap of 15 with a bicarbonate of 19. Blood sugar of 479. Blood cultures pending with no growth to date. Hemoglobin A1c was noted to be greater than 15.5. Patient placed on glucose stabilizer as well as IV fluids. Patient's anion gap closed. Patient was subsequently transitioned to subcutaneous insulin Lantus 8 units daily as well as sliding scale insulin. Patient's blood sugars improved and patient be discharged back on home regimen of NPH 70/35 units twice daily. Patient is to follow-up with PCP as outpatient.   #2 acute metabolic encephalopathy Patient did have an episode in the emergency room with patient was noted to have some slurred speech eyes gaze to the left lasting less than 5 minutes. Patient was able to follow commands at that time. Patient did not have any focal neurological deficits other than right residual weakness from prior CVA. CT head negative. Unable to get a MRI secondary to pacemaker. Likely secondary to problem #1. Patient has been pancultured. Patient was placed on IV fluids and patient hyperosmolar nonketotic hyperglycemia was corrected. Patient was back to baseline by day of discharge.  #3 acute on chronic kidney disease stage III baseline creatinine 1.4-1.8 Likely secondary to a prerenal azotemia in the setting of diuretics. When EMS arrived patient's home she was noted to have a systolic blood pressures in the 80s. Patient was placed on IV fluids. Patient's renal function improved such that by day of discharge patient was back to her baseline. Patient's creatinine on day of discharge was 1.54 from 2.30 on admission.   #4 dehydration Likely secondary to decreased oral intake. Patient was hydrated with IV fluids and was euvolemic by day of discharge.   #5 hypocalcemia/vitamin D deficiency Patient  noted to be significantly hypocalcemic during the hospitalization. It was felt patient's hypocalcemia was likely secondary to vitamin D deficiency oh altered metabolism. Likely secondary to malabsorption versus secondary to chronic kidney disease versus vitamin D deficiency.Patient was asymptomatic. Workup for hypocalcemia was done. Patient was noted to have a vitamin D, 25-hydroxy level of 5.4 which was significantly decreased. Phosphorus level was 3.5. Magnesium level was 1.7. PTH level was pending at time of discharge.  Patient was given IV calcium gluconate 2 g 1 and subsequently placed on oral calcium citrate and vitamin D. Nephrology was consulted and followed the patient throughout the hospitalization. Patient's calcium levels improved such that by day of discharge patient's corrected calcium was 8.06. Outpatient follow-up with PCP.   #6 metabolic acidosis Secondary to problem #1. Resolved by day of discharge.   #7 minimally elevated troponins Cardiac enzymes trended back down. Patient denied any ongoing chest pain. Elevated troponins likely secondary to problem #1. Patient was maintained on home regimen of Coreg. Outpatient follow-up with PCP.   #8 generalized weakness/failure to thrive Likely secondary to problem #1. Patient also with poor oral intake likely a failure to thrive. Patient was admitted hyperglycemia was corrected and patient was started on a diet. Patient ate adequately during the hospitalization. Patient was seen by physical therapy. Patient be discharged home with home health therapies. Outpatient follow-up.      Procedures:  CT head without contrast 07/25/2016  Chest x-ray 07/25/2016  Consultations:  Nephrology: Dr. Justin Mend 07/26/2016  Discharge Exam: Vitals:   07/27/16 0800 07/27/16 0836  BP: (!) 162/61 (!) 162/61  Pulse: 92 (!) 57  Resp: (!) 30 18  Temp:  98.4 F (36.9 C)    General: NAD Cardiovascular: RRR Respiratory: CTAB  Discharge  Instructions   Discharge Instructions    Diet - low sodium heart healthy    Complete by:  As directed    Increase activity slowly    Complete by:  As directed      Current Discharge Medication List    START taking these medications   Details  calcium-vitamin D (OSCAL WITH D) 500-200 MG-UNIT tablet Take 2 tablets by mouth 4 (four) times daily.    potassium chloride SA (K-DUR,KLOR-CON) 20 MEQ tablet Take 1 tablet (20 mEq total) by mouth daily. Qty: 30 tablet, Refills: 0      CONTINUE these medications which have CHANGED   Details  furosemide (LASIX) 20 MG tablet Take 1 tablet (20 mg total) by mouth daily. Resume at lower dose in 2 days. Qty: 30 tablet, Refills: 0    isosorbide mononitrate (IMDUR) 120 MG 24 hr tablet Take 1 tablet (120 mg total) by mouth daily. Resume in 2 days. Qty: 30 tablet, Refills: 0      CONTINUE these medications which have NOT CHANGED   Details  acetaminophen (TYLENOL) 325 MG tablet Take 2 tablets (650 mg total) by mouth every 4 (four) hours as needed for headache or mild pain. Qty: 30 tablet, Refills: 0    aspirin 325 MG tablet Take 325 mg by mouth daily.    clopidogrel (PLAVIX) 75 MG tablet Take 75 mg by mouth daily.    Garlic 814 MG CAPS Take 500 mg by mouth daily.     insulin NPH-regular Human (NOVOLIN 70/30) (70-30) 100 UNIT/ML injection Inject 5 Units into the skin 2 (two) times daily. Qty: 10 mL, Refills: 0    metoprolol tartrate (LOPRESSOR) 25 MG tablet Take 25 mg by mouth 2 (two) times daily.    Omega-3 Fatty Acids (FISH OIL) 1000 MG CAPS Take 1,000 mg by mouth 2 (two) times daily.     Oyster Shell Calcium 500 MG TABS Take 500 mg by mouth.       Allergies  Allergen Reactions  . Norvasc [Amlodipine Besylate] Swelling  . Peanut-Containing Drug Products Other (See Comments)    Stomach pain   Follow-up Information    Glendale Chard, MD Follow up in 1 week(s).   Specialty:  Internal Medicine Why:  f/u 1-2 weeks. Contact  information: [address] [place] [address] [phone]            The results of significant diagnostics from this hospitalization (including imaging, microbiology, ancillary and laboratory) are listed below for reference.    Significant Diagnostic Studies: Dg Chest 2 View  Result Date: 07/25/2016 CLINICAL DATA:  Weakness. Pt denies SOB and chest pain. Hx of CAD, HTN, pneumonia, stroke. Pt unable to extend right arm at all due to right sided weakness from previous stroke. EXAM: CHEST  2 VIEW COMPARISON:  03/27/2016 FINDINGS: Cardiac silhouette is normal in size. No mediastinal or hilar masses. No evidence of adenopathy. Left anterior chest wall sequential pacemaker is stable and well positioned. There are prominent bronchovascular markings. There is no evidence of pneumonia or pulmonary edema. No pleural effusion or pneumothorax. Skeletal structures are demineralized but grossly intact. There arthropathic changes of the right shoulder, stable. IMPRESSION: No acute cardiopulmonary disease. Electronically Signed   By: Shanon Brow  Ormond M.D.   On: 07/25/2016 10:51   Ct Head Wo Contrast  Result Date: 07/25/2016 CLINICAL DATA:  Confusion.  Altered mental status. EXAM: CT HEAD WITHOUT CONTRAST TECHNIQUE: Contiguous axial images were obtained from the base of the skull through the vertex without intravenous contrast. COMPARISON:  10/28/2015 FINDINGS: Brain: There is atrophy and chronic small vessel disease changes. Old lacunar infarct in the left basal ganglia. No acute intracranial abnormality. Specifically, no hemorrhage, hydrocephalus, mass lesion, acute infarction, or significant intracranial injury. Vascular: No hyperdense vessel or unexpected calcification. Skull: No acute calvarial abnormality. Sinuses/Orbits: Visualized paranasal sinuses and mastoids clear. Orbital soft tissues unremarkable. Other: None IMPRESSION: No acute intracranial abnormality. Atrophy, chronic  microvascular disease. Electronically Signed   By: Rolm Baptise M.D.   On: 07/25/2016 14:21   Ir Fluoro Guide Cv Line Right  Result Date: 07/26/2016 INDICATION: 83 year old with difficult intravenous access. Request for central line. Patient has chronic kidney disease and the left subclavian pacemaker. EXAM: PLACEMENT OF CENTRAL VENOUS CATHETER WITH ULTRASOUND AND FLUOROSCOPIC GUIDANCE MEDICATIONS: Ancef 2 g ANESTHESIA/SEDATION: None FLUOROSCOPY TIME:  Fluoroscopy Time: 48 seconds, 1.4 mGy COMPLICATIONS: Sterile field was contaminated during placement of the initial central line. As a result, the initial central line was completely removed and the neck was completely re-prepped and a new catheter was placed. In addition, the patient was given 2 g of Ancef due to the contamination. Unfortunately, the antibiotic extravasated from the peripheral IV. The extravasation was treated with cold compresses. No evidence for neurovascular compromise in the left arm. SIR level B: Nominal therapy (including overnight admission for observation), no consequence. PROCEDURE: Informed written consent was obtained from the patient after a thorough discussion of the procedural risks, benefits and alternatives. All questions were addressed. Maximal Sterile Barrier Technique was utilized including caps, mask, sterile gowns, sterile gloves, sterile drape, hand hygiene and skin antiseptic. A timeout was performed prior to the initiation of the procedure. Ultrasound confirmed a patent right internal jugular vein. The right side of the neck was prepped and draped in a sterile fashion. Maximal barrier sterile technique was utilized including caps, mask, sterile gowns, sterile gloves, sterile drape, hand hygiene and skin antiseptic. Skin was anesthetized with 1% lidocaine. 21 gauge needle directed into the right internal jugular vein with ultrasound guidance. Wire was advanced centrally. Peel-away sheath was placed. A dual lumen Power PICC  line was cut to 14 cm. PICC line was placed through the peel-away sheath. During placement of the catheter through the peel-away sheath, the field was contaminated with patient's hair. It was not clear how much to the catheter was contaminated. Therefore, the entire catheter was removed. The neck was completely re-prepped. Skin was again anesthetized with 1% lidocaine. 21 gauge needle was directed into the right internal jugular vein with ultrasound guidance. Wire was advanced centrally. A new peel-away sheath was placed. A new dual lumen Power PICC line was cut to 14 cm and placed through the peel-away sheath. Catheter tip was placed at the superior cavoatrial junction. Both lumens aspirated and flushed well. Catheter was sutured to skin. Catheter was flushed with sterile saline. FINDINGS: Catheter tip at the superior cavoatrial junction. IMPRESSION: Placement of a right jugular incisions catheter with ultrasound and fluoroscopic guidance. Electronically Signed   By: Markus Daft M.D.   On: 07/26/2016 13:58   Ir US Guide Vasc Access Right  Result Date: 07/26/2016 INDICATION: 83 year old with difficult intravenous access. Request for central line. Patient has chronic kidney disease and the left subclavian  pacemaker. EXAM: PLACEMENT OF CENTRAL VENOUS CATHETER WITH ULTRASOUND AND FLUOROSCOPIC GUIDANCE MEDICATIONS: Ancef 2 g ANESTHESIA/SEDATION: None FLUOROSCOPY TIME:  Fluoroscopy Time: 48 seconds, 1.4 mGy COMPLICATIONS: Sterile field was contaminated during placement of the initial central line. As a result, the initial central line was completely removed and the neck was completely re-prepped and a new catheter was placed. In addition, the patient was given 2 g of Ancef due to the contamination. Unfortunately, the antibiotic extravasated from the peripheral IV. The extravasation was treated with cold compresses. No evidence for neurovascular compromise in the left arm. SIR level B: Nominal therapy (including  overnight admission for observation), no consequence. PROCEDURE: Informed written consent was obtained from the patient after a thorough discussion of the procedural risks, benefits and alternatives. All questions were addressed. Maximal Sterile Barrier Technique was utilized including caps, mask, sterile gowns, sterile gloves, sterile drape, hand hygiene and skin antiseptic. A timeout was performed prior to the initiation of the procedure. Ultrasound confirmed a patent right internal jugular vein. The right side of the neck was prepped and draped in a sterile fashion. Maximal barrier sterile technique was utilized including caps, mask, sterile gowns, sterile gloves, sterile drape, hand hygiene and skin antiseptic. Skin was anesthetized with 1% lidocaine. 21 gauge needle directed into the right internal jugular vein with ultrasound guidance. Wire was advanced centrally. Peel-away sheath was placed. A dual lumen Power PICC line was cut to 14 cm. PICC line was placed through the peel-away sheath. During placement of the catheter through the peel-away sheath, the field was contaminated with patient's hair. It was not clear how much to the catheter was contaminated. Therefore, the entire catheter was removed. The neck was completely re-prepped. Skin was again anesthetized with 1% lidocaine. 21 gauge needle was directed into the right internal jugular vein with ultrasound guidance. Wire was advanced centrally. A new peel-away sheath was placed. A new dual lumen Power PICC line was cut to 14 cm and placed through the peel-away sheath. Catheter tip was placed at the superior cavoatrial junction. Both lumens aspirated and flushed well. Catheter was sutured to skin. Catheter was flushed with sterile saline. FINDINGS: Catheter tip at the superior cavoatrial junction. IMPRESSION: Placement of a right jugular incisions catheter with ultrasound and fluoroscopic guidance. Electronically Signed   By: Markus Daft M.D.   On:  07/26/2016 13:58    Microbiology: Recent Results (from the past 240 hour(s))  Urine culture     Status: None   Collection Time: 07/25/16 12:50 PM  Result Value Ref Range Status   Specimen Description URINE, RANDOM  Final   Special Requests NONE  Final   Culture NO GROWTH  Final   Report Status 07/26/2016 FINAL  Final  Culture, blood (Routine X 2) w Reflex to ID Panel     Status: None (Preliminary result)   Collection Time: 07/25/16  1:40 PM  Result Value Ref Range Status   Specimen Description BLOOD LEFT WRIST  Final   Special Requests   Final    BOTTLES DRAWN AEROBIC ONLY Blood Culture adequate volume   Culture NO GROWTH < 24 HOURS  Final   Report Status PENDING  Incomplete  Culture, blood (Routine X 2) w Reflex to ID Panel     Status: None (Preliminary result)   Collection Time: 07/25/16  1:47 PM  Result Value Ref Range Status   Specimen Description BLOOD LEFT HAND  Final   Special Requests IN PEDIATRIC BOTTLE Blood Culture adequate volume  Final  Culture NO GROWTH < 24 HOURS  Final   Report Status PENDING  Incomplete  MRSA PCR Screening     Status: None   Collection Time: 07/25/16  8:23 PM  Result Value Ref Range Status   MRSA by PCR NEGATIVE NEGATIVE Final    Comment:        The GeneXpert MRSA Assay (FDA approved for NASAL specimens only), is one component of a comprehensive MRSA colonization surveillance program. It is not intended to diagnose MRSA infection nor to guide or monitor treatment for MRSA infections.      Labs: Basic Metabolic Panel:  Recent Labs Lab 07/25/16 1341 07/25/16 1732 07/25/16 2106 07/26/16 0141 07/26/16 0650 07/26/16 1930 07/27/16 0600 07/27/16 0620  NA 132* 131* 131* 131* 130* 131*  --  134*  K 3.8 3.7 4.5 3.9 4.0 3.5  --  3.1*  CL 102 103 103 105 103 101  --  99*  CO2 17* 16* 13* 17* 15* 23  --  27  GLUCOSE 242* 131* 151* 150* 187* 198*  --  92  BUN 60* 60* 59* 57* 56* 45*  --  38*  CREATININE 2.15* 2.10* 2.08* 2.02* 1.88*  1.71*  --  1.54*  CALCIUM 6.2* 6.1* 6.0* 5.9* 5.6* 6.8*  --  6.7*  MG 2.0 2.0  --   --   --   --  1.7  --   PHOS  --  6.0*  --   --   --   --   --  3.5   Liver Function Tests:  Recent Labs Lab 07/25/16 1341 07/27/16 0620  AST 40  --   ALT 56*  --   ALKPHOS 100  --   BILITOT 0.4  --   PROT 6.0*  --   ALBUMIN 2.8* 2.3*   No results for input(s): LIPASE, AMYLASE in the last 168 hours. No results for input(s): AMMONIA in the last 168 hours. CBC:  Recent Labs Lab 07/25/16 1026 07/25/16 1034 07/27/16 0630  WBC 6.4  --  4.8  NEUTROABS 4.9  --   --   HGB 15.8* 17.3* 12.9  HCT 45.2 51.0* 36.6  MCV 89.2  --  88.8  PLT 200  --  161   Cardiac Enzymes:  Recent Labs Lab 07/25/16 1026 07/25/16 1341 07/25/16 2105 07/26/16 0141  TROPONINI 0.06* 0.06* 0.07* 0.05*   BNP: BNP (last 3 results) No results for input(s): BNP in the last 8760 hours.  ProBNP (last 3 results) No results for input(s): PROBNP in the last 8760 hours.  CBG:  Recent Labs Lab 07/26/16 0855 07/26/16 1413 07/26/16 1617 07/26/16 2204 07/27/16 0838  GLUCAP 149* 212* 319* 179* 111*       Signed:  Jessicca Stitzer MD.  [facility] 07/27/2016, 11:14 AM

## 2016-07-27 NOTE — Evaluation (Signed)
Speech Language Pathology Evaluation Patient Details Name: ASJA FROMMER MRN: [id number] DOB: [birth year] Today's Date: 07/27/2016 Time: 8299-3716 SLP Time Calculation (min) (ACUTE ONLY): 14 min  Problem List:  Patient Active Problem List   Diagnosis Date Noted  . Hyperglycemia 07/25/2016  . Acute metabolic encephalopathy 96/78/9381  . Hypocalcemia 07/25/2016  . Elevated troponin   . Weakness   . Normal anion gap metabolic acidosis 01/75/1025  . Chest pain 01/13/2016  . Hypoglycemia 10/28/2015  . DNR (do not resuscitate) discussion 02/27/2015  . Palliative care encounter 02/27/2015  . Acute on chronic kidney failure ([place])   . Constipation   . Dehydration, moderate 02/26/2015  . Failure to thrive in adult 02/26/2015  . Acute on chronic renal failure ([place]) 02/26/2015  . Acute cystitis without hematuria   . AKI (acute kidney injury) ([place]) 01/14/2015  . Metabolic acidosis 85/27/7824  . Urinary retention 11/02/2014  . Lower limb ischemia; LLE 11/02/2014  . Hyperkalemia 11/02/2014  . Acute renal failure syndrome ([place])   . Hematuria   . Acute encephalopathy   . Altered mental status 10/28/2014  . Confusion 10/28/2014  . Pulmonary edema 09/11/2014  . Acute respiratory failure with hypoxia ([place]) 09/11/2014  . Acute diastolic heart failure ([place]) 09/11/2014  . Protein-calorie malnutrition, severe ([place]) 08/06/2013  . Hypertensive crisis 08/05/2013  . Hypertensive urgency 08/03/2013  . Weakness generalized 04/06/2013  . Dehydration 04/06/2013  . Transaminitis 04/06/2013  . Type 2 diabetes mellitus with hyperosmolar nonketotic hyperglycemia ([place]) 04/05/2013  . CKD (chronic kidney disease), stage III 04/05/2013  . HTN (hypertension) 04/05/2013  . Dyslipidemia 04/05/2013  . Abnormal LFTs 04/05/2013  . Cerebral infarction ([place]) 10/15/2011  . [employer] 10/11/2011  . History of CVA (cerebrovascular accident) with associated mild right upper extremity hemiplegia  07/30/2011  . Retrovascular hypertension status post left renal artery stent 2009 and balloon angioplasty for in-stent restenosis 2011 04/05/2011  . Hypokalemia 03/28/2011  . Tobacco abuse 03/28/2011  . DM (diabetes mellitus), type 2, uncontrolled, periph vascular complic ([place]) 23/53/6144  . Peripheral neuropathy 03/28/2011   Past Medical History:  Past Medical History:  Diagnosis Date  . Anxiety   . Arthritis    "everywhere"  . Cataract    "both eyes; blood pressure's always too high to have them fixed" (09/11/2014)  . Chronic lower back pain   . Claudication in peripheral vascular disease ([place])    02/16/10: Left CIA 9.0x28 Omnilink and REIA 8.0x40 seff expanding Zilver.   right subclavian artery stent 03/18/2008,  . Coronary artery disease   . Herniated lumbar intervertebral disc   . Hypertension   . IDDM (insulin dependent diabetes mellitus) ([place])   . Migraine    "used to have terrible migraines; stopped in the 1990's"  . Pacemaker   . Peripheral vascular disease ([place])   . Pneumonia X 2  . Renal disorder   . Renovascular hypertension     s/p left renal artery stent 12/2007.  S/P balloon angioplasty on 02/16/10 for ISR, BP was  controlled well since then.     . Stroke [facility]) 1999   Stroke and TIA in 1999 with right sided weakness, now with residual right arm weakness (09/11/2014)  . UTI (lower urinary tract infection) 02/2015   Past Surgical History:  Past Surgical History:  Procedure Laterality Date  . ABDOMINAL ANGIOGRAM N/A 07/06/2011   Procedure: ABDOMINAL ANGIOGRAM;  Surgeon: Laverda Page, MD;  Location[facility] CATH LAB;  Service: Cardiovascular;  Laterality: N/A;  . APPENDECTOMY    .  BACK SURGERY    . CAROTID ENDARTERECTOMY Left 1991    Stroke and TIA in 1999 with right sided weakness, now with residual right arm weakness  . CARPAL TUNNEL RELEASE Left   . COLONOSCOPY  07/30/2011   Procedure: COLONOSCOPY;  Surgeon: Inda Castle, MD;  Location: [place];  Service:  Endoscopy;  Laterality: N/A;  gi bleed  . ESOPHAGOGASTRODUODENOSCOPY  07/30/2011   Procedure: ESOPHAGOGASTRODUODENOSCOPY (EGD);  Surgeon: Inda Castle, MD;  Location: [place];  Service: Endoscopy;  Laterality: N/A;  . EXCISIONAL HEMORRHOIDECTOMY    . INSERT / REPLACE / REMOVE PACEMAKER    . IR FLUORO GUIDE CV LINE RIGHT  07/26/2016  . IR US GUIDE VASC ACCESS RIGHT  07/26/2016  . LOWER EXTREMITY ANGIOGRAM Right 05/29/2012   Procedure: LOWER EXTREMITY ANGIOGRAM;  Surgeon: Laverda Page, MD;  Location: [facility] CATH LAB;  Service: Cardiovascular;  Laterality: Right;  . LUMBAR LAMINECTOMY     'herniated disc"  . PERIPHERAL VASCULAR CATHETERIZATION N/A 11/05/2014   Procedure: Abdominal Aortogram;  Surgeon: Serafina Mitchell, MD;  Location: [place] CV LAB;  Service: Cardiovascular;  Laterality: N/A;  . PERMANENT PACEMAKER INSERTION Left 06/28/2011   Procedure: PERMANENT PACEMAKER INSERTION;  Surgeon: Deboraha Sprang, MD;  Location: [facility] CATH LAB;  Service: Cardiovascular;  Laterality: Left;  . RENAL ANGIOGRAM N/A 07/06/2011   Procedure: RENAL ANGIOGRAM;  Surgeon: Laverda Page, MD;  Location: [facility] CATH LAB;  Service: Cardiovascular;  Laterality: N/A;  . TONSILLECTOMY    . URETERAL STENT PLACEMENT    . VAGINAL HYSTERECTOMY     HPI:  Pt is an 83 yo F admitted for hyperglycemia, AMS, metabolic acidosis. During the ED course patient was noted to have an episode of altered mental status with patient was noted to have some slurred speech, eyes gaze to the left, which lasted less than 5 minutes and patient was back at baseline. Head CT was done which was negative. CXR also clear. Pt was seen for BSE in 2016, normal swallow function at the time.    Assessment / Plan / Recommendation Clinical Impression  Pt has a mild dysarthria that is also likely impacted by the fact that she does not have her dentures in place. Cognitively she needs Mod cues for emergent awareness, functional problem solving, sustained  attention, and recall of daily events. She does follow simple instructions well. Although no family is present at this time, per PT note they believe that she is at her cognitive and communicative baseline and she will have appropriate assistance upon return home. Will follow acutely to maximize safety with only need for 24/7 supervision anticipated upon d/c.    SLP Assessment  SLP Recommendation/Assessment: Patient needs continued Speech Lanaguage Pathology Services SLP Visit Diagnosis: Cognitive communication deficit (R41.841)    Follow Up Recommendations  24 hour supervision/assistance    Frequency and Duration min 2x/week  1 week      SLP Evaluation Cognition  Overall Cognitive Status: No family/caregiver present to determine baseline cognitive functioning (per PT note, family thinks she is at her baseline) Arousal/Alertness: Awake/alert Orientation Level: Oriented to person;Oriented to place;Disoriented to time;Disoriented to situation Attention: Sustained Sustained Attention: Impaired Sustained Attention Impairment: Functional basic Memory: Impaired Memory Impairment: Decreased recall of new information Awareness: Impaired Awareness Impairment: Emergent impairment Problem Solving: Impaired Problem Solving Impairment: Functional basic Safety/Judgment: Impaired       Comprehension  Auditory Comprehension Overall Auditory Comprehension: Appears within functional limits for tasks assessed  Expression Expression Primary Mode of Expression: Verbal Verbal Expression Overall Verbal Expression: Appears within functional limits for tasks assessed   Oral / Motor  Oral Motor/Sensory Function Overall Oral Motor/Sensory Function: Generalized oral weakness Motor Speech Overall Motor Speech: Other (comment) (per PT note, family thinks she is at her baseline)   GO                    Germain Osgood 07/27/2016, 9:46 AM   Germain Osgood, M.A. CCC-SLP [phone]

## 2016-07-27 NOTE — Progress Notes (Signed)
  Speech Language Pathology Treatment: Dysphagia  Patient Details Name: Carmen [name] MRN: [id number] DOB: June 04, 1933 Today's Date: 07/27/2016 Time: 4196-2229 SLP Time Calculation (min) (ACUTE ONLY): 14 min  Assessment / Plan / Recommendation Clinical Impression  Upon SLP arrival, pt was eating her breakfast semi-reclined in bed with a large volume of partially masticated food in her mouth. Wet vocal quality and coughing was noted. SLP repositioned her for safer intake and provided Max cues for oral clearance, with pt take over five minutes to clear the bolus from her mouth as it was so large. Meanwhile, pt needed additional cues not to take more bites. Once her oral cavity was cleared, she had additional bites/sips with Mod cues provided for slower pacing with solids. No further overt s/s of aspiration were observed. Recommend that pt continue with Dys 2 diet and thin liquids with NO straws, although she will need full supervision in order to do so as evidenced by difficulties noted above. SLP will continue to follow.    HPI HPI: Pt is an 83 yo F admitted for hyperglycemia, AMS, metabolic acidosis. During the ED course patient was noted to have an episode of altered mental status with patient was noted to have some slurred speech, eyes gaze to the left, which lasted less than 5 minutes and patient was back at baseline. Head CT was done which was negative. CXR also clear. Pt was seen for BSE in 2016, normal swallow function at the time.       SLP Plan  Continue with current plan of care       Recommendations  Diet recommendations: Dysphagia 2 (fine chop);Thin liquid Liquids provided via: Cup;No straw Medication Administration: Whole meds with puree Supervision: Patient able to self feed;Full supervision/cueing for compensatory strategies Compensations: Slow rate;Small sips/bites;Lingual sweep for clearance of pocketing;Minimize environmental distractions (no mixed textures) Postural  Changes and/or Swallow Maneuvers: Seated upright 90 degrees;Upright 30-60 min after meal                Oral Care Recommendations: Oral care BID Follow up Recommendations: 24 hour supervision/assistance SLP Visit Diagnosis: Dysphagia, oral phase (R13.11) Plan: Continue with current plan of care       GO                Germain Osgood 07/27/2016, 9:35 AM  Germain Osgood, M.A. CCC-SLP [phone]

## 2016-07-27 NOTE — Care Management Note (Addendum)
Case Management Note  Patient Details  Name: TAEGAN HAIDER MRN: [id number] Date of Birth: Mar 11, 1933  Subjective/Objective:                    Action/Plan:   PTA from home with husband and other adult family members.  Daughter at bedside - per daughter  Pt is ambulatory independent however has walker and cane in the home.  CM offered HH choice , agency chosen was [facility] - agency contacted and referral accepted.  24 hour supervision will be provided by family.  Pt has PCP and per daughter pt does not have barriers to obtaining medications as prescribed.   Will discharge via private vehicle by daughter.   Expected Discharge Date:  07/27/16               Expected Discharge Plan:  [place]  In-House Referral:     Discharge planning Services  CM Consult  Post Acute Care Choice:    Choice offered to:  Adult Children, Patient  DME Arranged:    DME Agency:     HH Arranged:  RN, PT, OT, Social Work, Nurse's Aide [place] Agency:  [place]  Status of Service:  Completed, signed off  If discussed at [employer], dates discussed:    Additional Comments:  Maryclare Labrador, RN 07/27/2016, 11:27 AM

## 2016-07-27 NOTE — Progress Notes (Signed)
Physical Therapy Treatment Patient Details Name: Carmen [name] MRN: [id number] DOB: 07/09/1933 Today's Date: 07/27/2016    History of Present Illness Patient is a 83 y/o female who presents with generalized weakness, decreased oral intake, feeling poorly. EMS was called when pt found being tremulous and diaphoretic, Initial SBP was in the 80s with elevated blood sugars in the 500s. PMH includes DM, history of CVA with residual right-sided weakness and some dysarthria, CKD stage III-IV, diastolic CHF, SSS s/p pacemaker    PT Comments    Patient progressing well towards PT goals. Tolerated gait training with Min A for balance/safety at times due to instability. Pt furniture walker at baseline and reports feeling a lot stronger today than prior to admission. Has support at home. Does not like using her RW or SPC. No overt LOB noted during session.  Will follow if still in hospital.  Follow Up Recommendations  Supervision for mobility/OOB;No PT follow up     Equipment Recommendations  None recommended by PT    Recommendations for Other Services       Precautions / Restrictions Precautions Precautions: Fall Restrictions Weight Bearing Restrictions: No    Mobility  Bed Mobility Overal bed mobility: Needs Assistance Bed Mobility: Supine to Sit     Supine to sit: Supervision;HOB elevated     General bed mobility comments: Increased time to get to EOB with use of rail. No dizziness.  Transfers Overall transfer level: Needs assistance Equipment used: None Transfers: Sit to/from Stand Sit to Stand: Min guard         General transfer comment: Min guard for steadying assist to stand from EOB x1, from toilet x1 using grab bar.   Ambulation/Gait Ambulation/Gait assistance: Min assist Ambulation Distance (Feet): 75 Feet Assistive device: None Gait Pattern/deviations: Step-through pattern;Decreased stride length;Trunk flexed Gait velocity: decreased   General Gait Details:  Slow, unsteady gait. More steady on feet today. HR stable.   Stairs            Wheelchair Mobility    Modified Rankin (Stroke Patients Only)       Balance Overall balance assessment: Needs assistance Sitting-balance support: Feet supported;No upper extremity supported Sitting balance-Leahy Scale: Fair     Standing balance support: During functional activity Standing balance-Leahy Scale: Fair Standing balance comment: Able to reach down and grab toilet paper without LOB and perform pericare without instability.                             Cognition Arousal/Alertness: Awake/alert Behavior During Therapy: WFL for tasks assessed/performed Overall Cognitive Status: No family/caregiver present to determine baseline cognitive functioning                                 General Comments: Seems baseline per family.      Exercises      General Comments        Pertinent Vitals/Pain Pain Assessment: No/denies pain    Home Living       Type of Home: House              Prior Function            PT Goals (current goals can now be found in the care plan section) Progress towards PT goals: Progressing toward goals    Frequency    Min 3X/week      PT Plan Current  plan remains appropriate    Co-evaluation              AM-PAC PT "6 Clicks" Daily Activity  Outcome Measure  Difficulty turning over in bed (including adjusting bedclothes, sheets and blankets)?: None Difficulty moving from lying on back to sitting on the side of the bed? : None Difficulty sitting down on and standing up from a chair with arms (e.g., wheelchair, bedside commode, etc,.)?: None Help needed moving to and from a bed to chair (including a wheelchair)?: A Little Help needed walking in hospital room?: A Little Help needed climbing 3-5 steps with a railing? : A Little 6 Click Score: 21    End of Session Equipment Utilized During Treatment: Gait  belt Activity Tolerance: Patient tolerated treatment well Patient left: in bed;with call bell/phone within reach;with family/visitor present Nurse Communication: Mobility status PT Visit Diagnosis: Unsteadiness on feet (R26.81);Muscle weakness (generalized) (M62.81)     Time: 3220-2542 PT Time Calculation (min) (ACUTE ONLY): 17 min  Charges:  $Gait Training: 8-22 mins                    G Codes:       Wray Kearns, PT, DPT [phone]     [place] 07/27/2016, 9:51 AM

## 2016-07-27 NOTE — Progress Notes (Signed)
[facility] ROUNDING NOTE   Subjective:   Interval History: Patient is 83 year old lady with coronary artery disease, hypertension, history of CVA with residual right-sided weakness and dysarthria, status post PPM, insulin-dependent diabetes mellitus presented to the ED with a three-day history of generalized weakness decreased oral intake and feeling very poorly. When EMS got to patient's home systolic blood pressures when the 80s and blood sugars in the 500s. Patient was admitted for hyperosmolar nonketotic hyperglycemia. Patient placed on the glucose stabilizer. Patient also noted to be significantly hypocalcemic with corrected calciums of 6.56. Nephrology consulted.  Calcium improved and would continue to supplement with calcium and vitamin D   Objective:  Vital signs in last 24 hours:  Temp:  [97.6 F (36.4 C)-98.1 F (36.7 C)] 98.1 F (36.7 C) (05/22 0355) Pulse Rate:  [54-72] 61 (05/22 0400) Resp:  [10-17] 11 (05/22 0400) BP: (130-173)/(61-80) 154/61 (05/22 0400) SpO2:  [96 %-100 %] 96 % (05/22 0400)  Weight change:  Filed Weights   07/25/16 1023  Weight: 90 lb (40.8 kg)    Intake/Output: I/O last 3 completed shifts: In: 1899.9 [P.O.:200; I.V.:1699.9] Out: 475 [Urine:475]   Intake/Output this shift:  No intake/output data recorded.  CVS- RRR RS- CTA ABD- BS present soft non-distended EXT- no edema   Basic Metabolic Panel:  Recent Labs Lab 07/25/16 1341 07/25/16 1732 07/25/16 2106 07/26/16 0141 07/26/16 0650 07/26/16 1930 07/27/16 0620  NA 132* 131* 131* 131* 130* 131* 134*  K 3.8 3.7 4.5 3.9 4.0 3.5 3.1*  CL 102 103 103 105 103 101 99*  CO2 17* 16* 13* 17* 15* 23 27  GLUCOSE 242* 131* 151* 150* 187* 198* 92  BUN 60* 60* 59* 57* 56* 45* 38*  CREATININE 2.15* 2.10* 2.08* 2.02* 1.88* 1.71* 1.54*  CALCIUM 6.2* 6.1* 6.0* 5.9* 5.6* 6.8* 6.7*  MG 2.0 2.0  --   --   --   --   --   PHOS  --  6.0*  --   --   --   --  3.5    Liver Function  Tests:  Recent Labs Lab 07/25/16 1341 07/27/16 0620  AST 40  --   ALT 56*  --   ALKPHOS 100  --   BILITOT 0.4  --   PROT 6.0*  --   ALBUMIN 2.8* 2.3*   No results for input(s): LIPASE, AMYLASE in the last 168 hours. No results for input(s): AMMONIA in the last 168 hours.  CBC:  Recent Labs Lab 07/25/16 1026 07/25/16 1034 07/27/16 0630  WBC 6.4  --  4.8  NEUTROABS 4.9  --   --   HGB 15.8* 17.3* 12.9  HCT 45.2 51.0* 36.6  MCV 89.2  --  88.8  PLT 200  --  161    Cardiac Enzymes:  Recent Labs Lab 07/25/16 1026 07/25/16 1341 07/25/16 2105 07/26/16 0141  TROPONINI 0.06* 0.06* 0.07* 0.05*    BNP: Invalid input(s): POCBNP  CBG:  Recent Labs Lab 07/26/16 0814 07/26/16 0855 07/26/16 1413 07/26/16 1617 07/26/16 2204  GLUCAP 184* 149* 212* 319* 179*    Microbiology: Results for orders placed or performed during the hospital encounter of 07/25/16  Urine culture     Status: None   Collection Time: 07/25/16 12:50 PM  Result Value Ref Range Status   Specimen Description URINE, RANDOM  Final   Special Requests NONE  Final   Culture NO GROWTH  Final   Report Status 07/26/2016 FINAL  Final  Culture,  blood (Routine X 2) w Reflex to ID Panel     Status: None (Preliminary result)   Collection Time: 07/25/16  1:40 PM  Result Value Ref Range Status   Specimen Description BLOOD LEFT WRIST  Final   Special Requests   Final    BOTTLES DRAWN AEROBIC ONLY Blood Culture adequate volume   Culture NO GROWTH < 24 HOURS  Final   Report Status PENDING  Incomplete  Culture, blood (Routine X 2) w Reflex to ID Panel     Status: None (Preliminary result)   Collection Time: 07/25/16  1:47 PM  Result Value Ref Range Status   Specimen Description BLOOD LEFT HAND  Final   Special Requests IN PEDIATRIC BOTTLE Blood Culture adequate volume  Final   Culture NO GROWTH < 24 HOURS  Final   Report Status PENDING  Incomplete  MRSA PCR Screening     Status: None   Collection Time:  07/25/16  8:23 PM  Result Value Ref Range Status   MRSA by PCR NEGATIVE NEGATIVE Final    Comment:        The GeneXpert MRSA Assay (FDA approved for NASAL specimens only), is one component of a comprehensive MRSA colonization surveillance program. It is not intended to diagnose MRSA infection nor to guide or monitor treatment for MRSA infections.     Coagulation Studies: No results for input(s): LABPROT, INR in the last 72 hours.  Urinalysis:  Recent Labs  07/25/16 1250  COLORURINE YELLOW  LABSPEC 1.012  PHURINE 5.0  GLUCOSEU >=500*  HGBUR SMALL*  BILIRUBINUR NEGATIVE  KETONESUR NEGATIVE  PROTEINUR >=300*  NITRITE NEGATIVE  LEUKOCYTESUR NEGATIVE      Imaging: Dg Chest 2 View  Result Date: 07/25/2016 CLINICAL DATA:  Weakness. Pt denies SOB and chest pain. Hx of CAD, HTN, pneumonia, stroke. Pt unable to extend right arm at all due to right sided weakness from previous stroke. EXAM: CHEST  2 VIEW COMPARISON:  03/27/2016 FINDINGS: Cardiac silhouette is normal in size. No mediastinal or hilar masses. No evidence of adenopathy. Left anterior chest wall sequential pacemaker is stable and well positioned. There are prominent bronchovascular markings. There is no evidence of pneumonia or pulmonary edema. No pleural effusion or pneumothorax. Skeletal structures are demineralized but grossly intact. There arthropathic changes of the right shoulder, stable. IMPRESSION: No acute cardiopulmonary disease. Electronically Signed   By: Lajean Manes M.D.   On: 07/25/2016 10:51   Ct Head Wo Contrast  Result Date: 07/25/2016 CLINICAL DATA:  Confusion.  Altered mental status. EXAM: CT HEAD WITHOUT CONTRAST TECHNIQUE: Contiguous axial images were obtained from the base of the skull through the vertex without intravenous contrast. COMPARISON:  10/28/2015 FINDINGS: Brain: There is atrophy and chronic small vessel disease changes. Old lacunar infarct in the left basal ganglia. No acute  intracranial abnormality. Specifically, no hemorrhage, hydrocephalus, mass lesion, acute infarction, or significant intracranial injury. Vascular: No hyperdense vessel or unexpected calcification. Skull: No acute calvarial abnormality. Sinuses/Orbits: Visualized paranasal sinuses and mastoids clear. Orbital soft tissues unremarkable. Other: None IMPRESSION: No acute intracranial abnormality. Atrophy, chronic microvascular disease. Electronically Signed   By: Rolm Baptise M.D.   On: 07/25/2016 14:21   Ir Fluoro Guide Cv Line Right  Result Date: 07/26/2016 INDICATION: 83 year old with difficult intravenous access. Request for central line. Patient has chronic kidney disease and the left subclavian pacemaker. EXAM: PLACEMENT OF CENTRAL VENOUS CATHETER WITH ULTRASOUND AND FLUOROSCOPIC GUIDANCE MEDICATIONS: Ancef 2 g ANESTHESIA/SEDATION: None FLUOROSCOPY TIME:  Fluoroscopy Time: 48  seconds, 1.4 mGy COMPLICATIONS: Sterile field was contaminated during placement of the initial central line. As a result, the initial central line was completely removed and the neck was completely re-prepped and a new catheter was placed. In addition, the patient was given 2 g of Ancef due to the contamination. Unfortunately, the antibiotic extravasated from the peripheral IV. The extravasation was treated with cold compresses. No evidence for neurovascular compromise in the left arm. SIR level B: Nominal therapy (including overnight admission for observation), no consequence. PROCEDURE: Informed written consent was obtained from the patient after a thorough discussion of the procedural risks, benefits and alternatives. All questions were addressed. Maximal Sterile Barrier Technique was utilized including caps, mask, sterile gowns, sterile gloves, sterile drape, hand hygiene and skin antiseptic. A timeout was performed prior to the initiation of the procedure. Ultrasound confirmed a patent right internal jugular vein. The right side of  the neck was prepped and draped in a sterile fashion. Maximal barrier sterile technique was utilized including caps, mask, sterile gowns, sterile gloves, sterile drape, hand hygiene and skin antiseptic. Skin was anesthetized with 1% lidocaine. 21 gauge needle directed into the right internal jugular vein with ultrasound guidance. Wire was advanced centrally. Peel-away sheath was placed. A dual lumen Power PICC line was cut to 14 cm. PICC line was placed through the peel-away sheath. During placement of the catheter through the peel-away sheath, the field was contaminated with patient's hair. It was not clear how much to the catheter was contaminated. Therefore, the entire catheter was removed. The neck was completely re-prepped. Skin was again anesthetized with 1% lidocaine. 21 gauge needle was directed into the right internal jugular vein with ultrasound guidance. Wire was advanced centrally. A new peel-away sheath was placed. A new dual lumen Power PICC line was cut to 14 cm and placed through the peel-away sheath. Catheter tip was placed at the superior cavoatrial junction. Both lumens aspirated and flushed well. Catheter was sutured to skin. Catheter was flushed with sterile saline. FINDINGS: Catheter tip at the superior cavoatrial junction. IMPRESSION: Placement of a right jugular incisions catheter with ultrasound and fluoroscopic guidance. Electronically Signed   By: Markus Daft M.D.   On: 07/26/2016 13:58   Ir US Guide Vasc Access Right  Result Date: 07/26/2016 INDICATION: 83 year old with difficult intravenous access. Request for central line. Patient has chronic kidney disease and the left subclavian pacemaker. EXAM: PLACEMENT OF CENTRAL VENOUS CATHETER WITH ULTRASOUND AND FLUOROSCOPIC GUIDANCE MEDICATIONS: Ancef 2 g ANESTHESIA/SEDATION: None FLUOROSCOPY TIME:  Fluoroscopy Time: 48 seconds, 1.4 mGy COMPLICATIONS: Sterile field was contaminated during placement of the initial central line. As a result,  the initial central line was completely removed and the neck was completely re-prepped and a new catheter was placed. In addition, the patient was given 2 g of Ancef due to the contamination. Unfortunately, the antibiotic extravasated from the peripheral IV. The extravasation was treated with cold compresses. No evidence for neurovascular compromise in the left arm. SIR level B: Nominal therapy (including overnight admission for observation), no consequence. PROCEDURE: Informed written consent was obtained from the patient after a thorough discussion of the procedural risks, benefits and alternatives. All questions were addressed. Maximal Sterile Barrier Technique was utilized including caps, mask, sterile gowns, sterile gloves, sterile drape, hand hygiene and skin antiseptic. A timeout was performed prior to the initiation of the procedure. Ultrasound confirmed a patent right internal jugular vein. The right side of the neck was prepped and draped in a sterile fashion. Maximal  barrier sterile technique was utilized including caps, mask, sterile gowns, sterile gloves, sterile drape, hand hygiene and skin antiseptic. Skin was anesthetized with 1% lidocaine. 21 gauge needle directed into the right internal jugular vein with ultrasound guidance. Wire was advanced centrally. Peel-away sheath was placed. A dual lumen Power PICC line was cut to 14 cm. PICC line was placed through the peel-away sheath. During placement of the catheter through the peel-away sheath, the field was contaminated with patient's hair. It was not clear how much to the catheter was contaminated. Therefore, the entire catheter was removed. The neck was completely re-prepped. Skin was again anesthetized with 1% lidocaine. 21 gauge needle was directed into the right internal jugular vein with ultrasound guidance. Wire was advanced centrally. A new peel-away sheath was placed. A new dual lumen Power PICC line was cut to 14 cm and placed through the  peel-away sheath. Catheter tip was placed at the superior cavoatrial junction. Both lumens aspirated and flushed well. Catheter was sutured to skin. Catheter was flushed with sterile saline. FINDINGS: Catheter tip at the superior cavoatrial junction. IMPRESSION: Placement of a right jugular incisions catheter with ultrasound and fluoroscopic guidance. Electronically Signed   By: Markus Daft M.D.   On: 07/26/2016 13:58     Medications:    . aspirin  325 mg Oral Daily  . calcium-vitamin D  2 tablet Oral QID  . clopidogrel  75 mg Oral Daily  . heparin  5,000 Units Subcutaneous Q8H  . insulin aspart  0-9 Units Subcutaneous TID WC  . insulin glargine  8 Units Subcutaneous Daily  . metoprolol tartrate  25 mg Oral BID  . omega-3 acid ethyl esters  1 g Oral BID  . potassium chloride  40 mEq Oral Q4H   acetaminophen, lidocaine (PF), ondansetron (ZOFRAN) IV, oxyCODONE-acetaminophen, sorbitol  Assessment/ Plan:   Chronic renal disease baseline creatinine about 1.5 - 1.7    Renal ultrasound 11/16 showed echogenic kidney    Creatinine has increased slightly possible related to some hypotension  , she appears to be volume depleted on admission and blood pressure improved with hydration  HTN controlled  BP better no antihypertensives  Patient on beta blockers and isosorbide as an outpatient  Hypocalcemia  Most likely due to low vitamin D or altered metabolism  Agree with oral calcium and vit D   Metabolic acidosis  Long standing  May have RTA   Replete with bicarbonate. This is better.  I will sign off on patient as there is little for me to add. She may need oral bicarbonate  650mg  BID  -- otherwise is doing well    LOS: 2 Hubbert Landrigan W @TODAY @8 :11 AM

## 2016-07-28 DIAGNOSIS — I69322 Dysarthria following cerebral infarction: Secondary | ICD-10-CM | POA: Diagnosis not present

## 2016-07-28 DIAGNOSIS — I2510 Atherosclerotic heart disease of native coronary artery without angina pectoris: Secondary | ICD-10-CM | POA: Diagnosis not present

## 2016-07-28 DIAGNOSIS — N183 Chronic kidney disease, stage 3 (moderate): Secondary | ICD-10-CM | POA: Diagnosis not present

## 2016-07-28 DIAGNOSIS — I69351 Hemiplegia and hemiparesis following cerebral infarction affecting right dominant side: Secondary | ICD-10-CM | POA: Diagnosis not present

## 2016-07-28 DIAGNOSIS — M159 Polyosteoarthritis, unspecified: Secondary | ICD-10-CM | POA: Diagnosis not present

## 2016-07-28 DIAGNOSIS — E1122 Type 2 diabetes mellitus with diabetic chronic kidney disease: Secondary | ICD-10-CM | POA: Diagnosis not present

## 2016-07-28 DIAGNOSIS — E1151 Type 2 diabetes mellitus with diabetic peripheral angiopathy without gangrene: Secondary | ICD-10-CM | POA: Diagnosis not present

## 2016-07-28 DIAGNOSIS — I150 Renovascular hypertension: Secondary | ICD-10-CM | POA: Diagnosis not present

## 2016-07-28 DIAGNOSIS — E1165 Type 2 diabetes mellitus with hyperglycemia: Secondary | ICD-10-CM | POA: Diagnosis not present

## 2016-07-28 LAB — PTH, INTACT AND CALCIUM
CALCIUM TOTAL (PTH): 6.2 mg/dL — AB (ref 8.7–10.3)
PTH: 1067 pg/mL — ABNORMAL HIGH (ref 15–65)

## 2016-07-28 LAB — CALCIUM, IONIZED: CALCIUM, IONIZED, SERUM: 3.5 mg/dL — AB (ref 4.5–5.6)

## 2016-07-28 LAB — CALCITRIOL (1,25 DI-OH VIT D): VIT D 1 25 DIHYDROXY: 24.4 pg/mL (ref 19.9–79.3)

## 2016-07-29 DIAGNOSIS — E1165 Type 2 diabetes mellitus with hyperglycemia: Secondary | ICD-10-CM | POA: Diagnosis not present

## 2016-07-29 DIAGNOSIS — E1122 Type 2 diabetes mellitus with diabetic chronic kidney disease: Secondary | ICD-10-CM | POA: Diagnosis not present

## 2016-07-29 DIAGNOSIS — I150 Renovascular hypertension: Secondary | ICD-10-CM | POA: Diagnosis not present

## 2016-07-29 DIAGNOSIS — E1151 Type 2 diabetes mellitus with diabetic peripheral angiopathy without gangrene: Secondary | ICD-10-CM | POA: Diagnosis not present

## 2016-07-29 DIAGNOSIS — N183 Chronic kidney disease, stage 3 (moderate): Secondary | ICD-10-CM | POA: Diagnosis not present

## 2016-07-29 DIAGNOSIS — I69322 Dysarthria following cerebral infarction: Secondary | ICD-10-CM | POA: Diagnosis not present

## 2016-07-29 DIAGNOSIS — M159 Polyosteoarthritis, unspecified: Secondary | ICD-10-CM | POA: Diagnosis not present

## 2016-07-29 DIAGNOSIS — I2510 Atherosclerotic heart disease of native coronary artery without angina pectoris: Secondary | ICD-10-CM | POA: Diagnosis not present

## 2016-07-29 DIAGNOSIS — I69351 Hemiplegia and hemiparesis following cerebral infarction affecting right dominant side: Secondary | ICD-10-CM | POA: Diagnosis not present

## 2016-07-30 DIAGNOSIS — E1151 Type 2 diabetes mellitus with diabetic peripheral angiopathy without gangrene: Secondary | ICD-10-CM | POA: Diagnosis not present

## 2016-07-30 DIAGNOSIS — E1165 Type 2 diabetes mellitus with hyperglycemia: Secondary | ICD-10-CM | POA: Diagnosis not present

## 2016-07-30 DIAGNOSIS — I2510 Atherosclerotic heart disease of native coronary artery without angina pectoris: Secondary | ICD-10-CM | POA: Diagnosis not present

## 2016-07-30 DIAGNOSIS — M159 Polyosteoarthritis, unspecified: Secondary | ICD-10-CM | POA: Diagnosis not present

## 2016-07-30 DIAGNOSIS — I69322 Dysarthria following cerebral infarction: Secondary | ICD-10-CM | POA: Diagnosis not present

## 2016-07-30 DIAGNOSIS — I69351 Hemiplegia and hemiparesis following cerebral infarction affecting right dominant side: Secondary | ICD-10-CM | POA: Diagnosis not present

## 2016-07-30 DIAGNOSIS — N183 Chronic kidney disease, stage 3 (moderate): Secondary | ICD-10-CM | POA: Diagnosis not present

## 2016-07-30 DIAGNOSIS — I150 Renovascular hypertension: Secondary | ICD-10-CM | POA: Diagnosis not present

## 2016-07-30 DIAGNOSIS — E1122 Type 2 diabetes mellitus with diabetic chronic kidney disease: Secondary | ICD-10-CM | POA: Diagnosis not present

## 2016-07-30 LAB — CULTURE, BLOOD (ROUTINE X 2)
CULTURE: NO GROWTH
CULTURE: NO GROWTH
SPECIAL REQUESTS: ADEQUATE
Special Requests: ADEQUATE

## 2016-08-02 DIAGNOSIS — I150 Renovascular hypertension: Secondary | ICD-10-CM | POA: Diagnosis not present

## 2016-08-02 DIAGNOSIS — N183 Chronic kidney disease, stage 3 (moderate): Secondary | ICD-10-CM | POA: Diagnosis not present

## 2016-08-02 DIAGNOSIS — E1122 Type 2 diabetes mellitus with diabetic chronic kidney disease: Secondary | ICD-10-CM | POA: Diagnosis not present

## 2016-08-02 DIAGNOSIS — I2510 Atherosclerotic heart disease of native coronary artery without angina pectoris: Secondary | ICD-10-CM | POA: Diagnosis not present

## 2016-08-02 DIAGNOSIS — M159 Polyosteoarthritis, unspecified: Secondary | ICD-10-CM | POA: Diagnosis not present

## 2016-08-02 DIAGNOSIS — I69351 Hemiplegia and hemiparesis following cerebral infarction affecting right dominant side: Secondary | ICD-10-CM | POA: Diagnosis not present

## 2016-08-02 DIAGNOSIS — I69322 Dysarthria following cerebral infarction: Secondary | ICD-10-CM | POA: Diagnosis not present

## 2016-08-02 DIAGNOSIS — E1165 Type 2 diabetes mellitus with hyperglycemia: Secondary | ICD-10-CM | POA: Diagnosis not present

## 2016-08-02 DIAGNOSIS — E1151 Type 2 diabetes mellitus with diabetic peripheral angiopathy without gangrene: Secondary | ICD-10-CM | POA: Diagnosis not present

## 2016-08-03 DIAGNOSIS — I69322 Dysarthria following cerebral infarction: Secondary | ICD-10-CM | POA: Diagnosis not present

## 2016-08-03 DIAGNOSIS — I2510 Atherosclerotic heart disease of native coronary artery without angina pectoris: Secondary | ICD-10-CM | POA: Diagnosis not present

## 2016-08-03 DIAGNOSIS — E1151 Type 2 diabetes mellitus with diabetic peripheral angiopathy without gangrene: Secondary | ICD-10-CM | POA: Diagnosis not present

## 2016-08-03 DIAGNOSIS — M159 Polyosteoarthritis, unspecified: Secondary | ICD-10-CM | POA: Diagnosis not present

## 2016-08-03 DIAGNOSIS — E1165 Type 2 diabetes mellitus with hyperglycemia: Secondary | ICD-10-CM | POA: Diagnosis not present

## 2016-08-03 DIAGNOSIS — E1122 Type 2 diabetes mellitus with diabetic chronic kidney disease: Secondary | ICD-10-CM | POA: Diagnosis not present

## 2016-08-03 DIAGNOSIS — I150 Renovascular hypertension: Secondary | ICD-10-CM | POA: Diagnosis not present

## 2016-08-03 DIAGNOSIS — I69351 Hemiplegia and hemiparesis following cerebral infarction affecting right dominant side: Secondary | ICD-10-CM | POA: Diagnosis not present

## 2016-08-03 DIAGNOSIS — N183 Chronic kidney disease, stage 3 (moderate): Secondary | ICD-10-CM | POA: Diagnosis not present

## 2016-08-04 DIAGNOSIS — I69351 Hemiplegia and hemiparesis following cerebral infarction affecting right dominant side: Secondary | ICD-10-CM | POA: Diagnosis not present

## 2016-08-04 DIAGNOSIS — E1151 Type 2 diabetes mellitus with diabetic peripheral angiopathy without gangrene: Secondary | ICD-10-CM | POA: Diagnosis not present

## 2016-08-04 DIAGNOSIS — I2510 Atherosclerotic heart disease of native coronary artery without angina pectoris: Secondary | ICD-10-CM | POA: Diagnosis not present

## 2016-08-04 DIAGNOSIS — E1122 Type 2 diabetes mellitus with diabetic chronic kidney disease: Secondary | ICD-10-CM | POA: Diagnosis not present

## 2016-08-04 DIAGNOSIS — E1165 Type 2 diabetes mellitus with hyperglycemia: Secondary | ICD-10-CM | POA: Diagnosis not present

## 2016-08-04 DIAGNOSIS — I150 Renovascular hypertension: Secondary | ICD-10-CM | POA: Diagnosis not present

## 2016-08-04 DIAGNOSIS — N183 Chronic kidney disease, stage 3 (moderate): Secondary | ICD-10-CM | POA: Diagnosis not present

## 2016-08-04 DIAGNOSIS — M159 Polyosteoarthritis, unspecified: Secondary | ICD-10-CM | POA: Diagnosis not present

## 2016-08-04 DIAGNOSIS — I69322 Dysarthria following cerebral infarction: Secondary | ICD-10-CM | POA: Diagnosis not present

## 2016-08-05 DIAGNOSIS — I69322 Dysarthria following cerebral infarction: Secondary | ICD-10-CM | POA: Diagnosis not present

## 2016-08-05 DIAGNOSIS — I69351 Hemiplegia and hemiparesis following cerebral infarction affecting right dominant side: Secondary | ICD-10-CM | POA: Diagnosis not present

## 2016-08-05 DIAGNOSIS — E1122 Type 2 diabetes mellitus with diabetic chronic kidney disease: Secondary | ICD-10-CM | POA: Diagnosis not present

## 2016-08-05 DIAGNOSIS — E1151 Type 2 diabetes mellitus with diabetic peripheral angiopathy without gangrene: Secondary | ICD-10-CM | POA: Diagnosis not present

## 2016-08-05 DIAGNOSIS — M159 Polyosteoarthritis, unspecified: Secondary | ICD-10-CM | POA: Diagnosis not present

## 2016-08-05 DIAGNOSIS — E1165 Type 2 diabetes mellitus with hyperglycemia: Secondary | ICD-10-CM | POA: Diagnosis not present

## 2016-08-05 DIAGNOSIS — I2510 Atherosclerotic heart disease of native coronary artery without angina pectoris: Secondary | ICD-10-CM | POA: Diagnosis not present

## 2016-08-05 DIAGNOSIS — I150 Renovascular hypertension: Secondary | ICD-10-CM | POA: Diagnosis not present

## 2016-08-05 DIAGNOSIS — N183 Chronic kidney disease, stage 3 (moderate): Secondary | ICD-10-CM | POA: Diagnosis not present

## 2016-08-06 LAB — GLUCOSE, CAPILLARY
GLUCOSE-CAPILLARY: 153 mg/dL — AB (ref 65–99)
GLUCOSE-CAPILLARY: 132 mg/dL — AB (ref 65–99)
GLUCOSE-CAPILLARY: 141 mg/dL — AB (ref 65–99)
Glucose-Capillary: 122 mg/dL — ABNORMAL HIGH (ref 65–99)
Glucose-Capillary: 145 mg/dL — ABNORMAL HIGH (ref 65–99)
Glucose-Capillary: 150 mg/dL — ABNORMAL HIGH (ref 65–99)
Glucose-Capillary: 151 mg/dL — ABNORMAL HIGH (ref 65–99)

## 2016-08-09 DIAGNOSIS — M159 Polyosteoarthritis, unspecified: Secondary | ICD-10-CM | POA: Diagnosis not present

## 2016-08-09 DIAGNOSIS — E1165 Type 2 diabetes mellitus with hyperglycemia: Secondary | ICD-10-CM | POA: Diagnosis not present

## 2016-08-09 DIAGNOSIS — I2510 Atherosclerotic heart disease of native coronary artery without angina pectoris: Secondary | ICD-10-CM | POA: Diagnosis not present

## 2016-08-09 DIAGNOSIS — E1122 Type 2 diabetes mellitus with diabetic chronic kidney disease: Secondary | ICD-10-CM | POA: Diagnosis not present

## 2016-08-09 DIAGNOSIS — I69322 Dysarthria following cerebral infarction: Secondary | ICD-10-CM | POA: Diagnosis not present

## 2016-08-09 DIAGNOSIS — I150 Renovascular hypertension: Secondary | ICD-10-CM | POA: Diagnosis not present

## 2016-08-09 DIAGNOSIS — N183 Chronic kidney disease, stage 3 (moderate): Secondary | ICD-10-CM | POA: Diagnosis not present

## 2016-08-09 DIAGNOSIS — E1151 Type 2 diabetes mellitus with diabetic peripheral angiopathy without gangrene: Secondary | ICD-10-CM | POA: Diagnosis not present

## 2016-08-09 DIAGNOSIS — I69351 Hemiplegia and hemiparesis following cerebral infarction affecting right dominant side: Secondary | ICD-10-CM | POA: Diagnosis not present

## 2016-08-23 DIAGNOSIS — N183 Chronic kidney disease, stage 3 (moderate): Secondary | ICD-10-CM | POA: Diagnosis not present

## 2016-08-23 DIAGNOSIS — I150 Renovascular hypertension: Secondary | ICD-10-CM | POA: Diagnosis not present

## 2016-08-23 DIAGNOSIS — M159 Polyosteoarthritis, unspecified: Secondary | ICD-10-CM | POA: Diagnosis not present

## 2016-08-23 DIAGNOSIS — E1165 Type 2 diabetes mellitus with hyperglycemia: Secondary | ICD-10-CM | POA: Diagnosis not present

## 2016-08-23 DIAGNOSIS — E1151 Type 2 diabetes mellitus with diabetic peripheral angiopathy without gangrene: Secondary | ICD-10-CM | POA: Diagnosis not present

## 2016-08-23 DIAGNOSIS — I69322 Dysarthria following cerebral infarction: Secondary | ICD-10-CM | POA: Diagnosis not present

## 2016-08-23 DIAGNOSIS — I69351 Hemiplegia and hemiparesis following cerebral infarction affecting right dominant side: Secondary | ICD-10-CM | POA: Diagnosis not present

## 2016-08-23 DIAGNOSIS — I2510 Atherosclerotic heart disease of native coronary artery without angina pectoris: Secondary | ICD-10-CM | POA: Diagnosis not present

## 2016-08-23 DIAGNOSIS — E1122 Type 2 diabetes mellitus with diabetic chronic kidney disease: Secondary | ICD-10-CM | POA: Diagnosis not present

## 2016-08-24 DIAGNOSIS — N183 Chronic kidney disease, stage 3 (moderate): Secondary | ICD-10-CM | POA: Diagnosis not present

## 2016-08-24 DIAGNOSIS — N08 Glomerular disorders in diseases classified elsewhere: Secondary | ICD-10-CM | POA: Diagnosis not present

## 2016-08-24 DIAGNOSIS — E1122 Type 2 diabetes mellitus with diabetic chronic kidney disease: Secondary | ICD-10-CM | POA: Diagnosis not present

## 2016-08-24 DIAGNOSIS — I5032 Chronic diastolic (congestive) heart failure: Secondary | ICD-10-CM | POA: Diagnosis not present

## 2016-08-24 DIAGNOSIS — I5020 Unspecified systolic (congestive) heart failure: Secondary | ICD-10-CM | POA: Diagnosis not present

## 2016-08-24 DIAGNOSIS — Z23 Encounter for immunization: Secondary | ICD-10-CM | POA: Diagnosis not present

## 2016-09-02 DIAGNOSIS — N183 Chronic kidney disease, stage 3 (moderate): Secondary | ICD-10-CM | POA: Diagnosis not present

## 2016-09-02 DIAGNOSIS — I69322 Dysarthria following cerebral infarction: Secondary | ICD-10-CM | POA: Diagnosis not present

## 2016-09-02 DIAGNOSIS — E1165 Type 2 diabetes mellitus with hyperglycemia: Secondary | ICD-10-CM | POA: Diagnosis not present

## 2016-09-02 DIAGNOSIS — E1122 Type 2 diabetes mellitus with diabetic chronic kidney disease: Secondary | ICD-10-CM | POA: Diagnosis not present

## 2016-09-02 DIAGNOSIS — M159 Polyosteoarthritis, unspecified: Secondary | ICD-10-CM | POA: Diagnosis not present

## 2016-09-02 DIAGNOSIS — E1151 Type 2 diabetes mellitus with diabetic peripheral angiopathy without gangrene: Secondary | ICD-10-CM | POA: Diagnosis not present

## 2016-09-02 DIAGNOSIS — I69351 Hemiplegia and hemiparesis following cerebral infarction affecting right dominant side: Secondary | ICD-10-CM | POA: Diagnosis not present

## 2016-09-02 DIAGNOSIS — I150 Renovascular hypertension: Secondary | ICD-10-CM | POA: Diagnosis not present

## 2016-09-02 DIAGNOSIS — I2510 Atherosclerotic heart disease of native coronary artery without angina pectoris: Secondary | ICD-10-CM | POA: Diagnosis not present

## 2016-09-06 DIAGNOSIS — I10 Essential (primary) hypertension: Secondary | ICD-10-CM | POA: Diagnosis not present

## 2016-09-06 DIAGNOSIS — E0865 Diabetes mellitus due to underlying condition with hyperglycemia: Secondary | ICD-10-CM | POA: Diagnosis not present

## 2016-09-06 DIAGNOSIS — I5032 Chronic diastolic (congestive) heart failure: Secondary | ICD-10-CM | POA: Diagnosis not present

## 2016-09-06 DIAGNOSIS — Z950 Presence of cardiac pacemaker: Secondary | ICD-10-CM | POA: Diagnosis not present

## 2016-09-07 DIAGNOSIS — I69351 Hemiplegia and hemiparesis following cerebral infarction affecting right dominant side: Secondary | ICD-10-CM | POA: Diagnosis not present

## 2016-09-07 DIAGNOSIS — E1165 Type 2 diabetes mellitus with hyperglycemia: Secondary | ICD-10-CM | POA: Diagnosis not present

## 2016-09-07 DIAGNOSIS — I69322 Dysarthria following cerebral infarction: Secondary | ICD-10-CM | POA: Diagnosis not present

## 2016-09-07 DIAGNOSIS — I2510 Atherosclerotic heart disease of native coronary artery without angina pectoris: Secondary | ICD-10-CM | POA: Diagnosis not present

## 2016-09-07 DIAGNOSIS — N183 Chronic kidney disease, stage 3 (moderate): Secondary | ICD-10-CM | POA: Diagnosis not present

## 2016-09-07 DIAGNOSIS — E1151 Type 2 diabetes mellitus with diabetic peripheral angiopathy without gangrene: Secondary | ICD-10-CM | POA: Diagnosis not present

## 2016-09-07 DIAGNOSIS — M159 Polyosteoarthritis, unspecified: Secondary | ICD-10-CM | POA: Diagnosis not present

## 2016-09-07 DIAGNOSIS — E1122 Type 2 diabetes mellitus with diabetic chronic kidney disease: Secondary | ICD-10-CM | POA: Diagnosis not present

## 2016-09-07 DIAGNOSIS — I150 Renovascular hypertension: Secondary | ICD-10-CM | POA: Diagnosis not present

## 2016-09-22 DIAGNOSIS — E1122 Type 2 diabetes mellitus with diabetic chronic kidney disease: Secondary | ICD-10-CM | POA: Diagnosis not present

## 2016-09-22 DIAGNOSIS — E1151 Type 2 diabetes mellitus with diabetic peripheral angiopathy without gangrene: Secondary | ICD-10-CM | POA: Diagnosis not present

## 2016-09-22 DIAGNOSIS — E1165 Type 2 diabetes mellitus with hyperglycemia: Secondary | ICD-10-CM | POA: Diagnosis not present

## 2016-09-22 DIAGNOSIS — I69322 Dysarthria following cerebral infarction: Secondary | ICD-10-CM | POA: Diagnosis not present

## 2016-09-22 DIAGNOSIS — N183 Chronic kidney disease, stage 3 (moderate): Secondary | ICD-10-CM | POA: Diagnosis not present

## 2016-09-22 DIAGNOSIS — I150 Renovascular hypertension: Secondary | ICD-10-CM | POA: Diagnosis not present

## 2016-09-22 DIAGNOSIS — M159 Polyosteoarthritis, unspecified: Secondary | ICD-10-CM | POA: Diagnosis not present

## 2016-09-22 DIAGNOSIS — I2510 Atherosclerotic heart disease of native coronary artery without angina pectoris: Secondary | ICD-10-CM | POA: Diagnosis not present

## 2016-09-22 DIAGNOSIS — I69351 Hemiplegia and hemiparesis following cerebral infarction affecting right dominant side: Secondary | ICD-10-CM | POA: Diagnosis not present

## 2016-10-19 DIAGNOSIS — R531 Weakness: Secondary | ICD-10-CM | POA: Diagnosis not present

## 2016-11-17 ENCOUNTER — Encounter (HOSPITAL_COMMUNITY): Payer: Self-pay | Admitting: Emergency Medicine

## 2016-11-17 ENCOUNTER — Emergency Department (HOSPITAL_COMMUNITY)
Admission: EM | Admit: 2016-11-17 | Discharge: 2016-11-17 | Disposition: A | Discharge status: Home / Self Care | Payer: Medicare HMO | Attending: Emergency Medicine | Admitting: Emergency Medicine

## 2016-11-17 ENCOUNTER — Emergency Department (HOSPITAL_COMMUNITY): Payer: Medicare HMO

## 2016-11-17 DIAGNOSIS — Z794 Long term (current) use of insulin: Secondary | ICD-10-CM | POA: Diagnosis not present

## 2016-11-17 DIAGNOSIS — Z950 Presence of cardiac pacemaker: Secondary | ICD-10-CM

## 2016-11-17 DIAGNOSIS — N183 Chronic kidney disease, stage 3 (moderate): Secondary | ICD-10-CM | POA: Insufficient documentation

## 2016-11-17 DIAGNOSIS — I5030 Unspecified diastolic (congestive) heart failure: Secondary | ICD-10-CM | POA: Diagnosis not present

## 2016-11-17 DIAGNOSIS — E1122 Type 2 diabetes mellitus with diabetic chronic kidney disease: Secondary | ICD-10-CM | POA: Diagnosis not present

## 2016-11-17 DIAGNOSIS — Z91010 Allergy to peanuts: Secondary | ICD-10-CM | POA: Insufficient documentation

## 2016-11-17 DIAGNOSIS — I130 Hypertensive heart and chronic kidney disease with heart failure and stage 1 through stage 4 chronic kidney disease, or unspecified chronic kidney disease: Secondary | ICD-10-CM | POA: Insufficient documentation

## 2016-11-17 DIAGNOSIS — R2990 Unspecified symptoms and signs involving the nervous system: Secondary | ICD-10-CM | POA: Diagnosis not present

## 2016-11-17 DIAGNOSIS — Z7902 Long term (current) use of antithrombotics/antiplatelets: Secondary | ICD-10-CM | POA: Diagnosis not present

## 2016-11-17 DIAGNOSIS — I2510 Atherosclerotic heart disease of native coronary artery without angina pectoris: Secondary | ICD-10-CM | POA: Insufficient documentation

## 2016-11-17 DIAGNOSIS — Z8673 Personal history of transient ischemic attack (TIA), and cerebral infarction without residual deficits: Secondary | ICD-10-CM | POA: Diagnosis not present

## 2016-11-17 DIAGNOSIS — E11649 Type 2 diabetes mellitus with hypoglycemia without coma: Secondary | ICD-10-CM | POA: Diagnosis not present

## 2016-11-17 DIAGNOSIS — E1159 Type 2 diabetes mellitus with other circulatory complications: Secondary | ICD-10-CM | POA: Diagnosis not present

## 2016-11-17 DIAGNOSIS — R29818 Other symptoms and signs involving the nervous system: Secondary | ICD-10-CM | POA: Diagnosis not present

## 2016-11-17 DIAGNOSIS — R4182 Altered mental status, unspecified: Secondary | ICD-10-CM | POA: Diagnosis present

## 2016-11-17 DIAGNOSIS — Z87891 Personal history of nicotine dependence: Secondary | ICD-10-CM | POA: Insufficient documentation

## 2016-11-17 DIAGNOSIS — Z7982 Long term (current) use of aspirin: Secondary | ICD-10-CM | POA: Insufficient documentation

## 2016-11-17 DIAGNOSIS — I10 Essential (primary) hypertension: Secondary | ICD-10-CM

## 2016-11-17 DIAGNOSIS — E162 Hypoglycemia, unspecified: Secondary | ICD-10-CM | POA: Diagnosis not present

## 2016-11-17 DIAGNOSIS — Z79899 Other long term (current) drug therapy: Secondary | ICD-10-CM | POA: Diagnosis not present

## 2016-11-17 DIAGNOSIS — E1065 Type 1 diabetes mellitus with hyperglycemia: Secondary | ICD-10-CM | POA: Diagnosis not present

## 2016-11-17 LAB — URINALYSIS, ROUTINE W REFLEX MICROSCOPIC
BILIRUBIN URINE: NEGATIVE
Glucose, UA: 50 mg/dL — AB
Ketones, ur: NEGATIVE mg/dL
Leukocytes, UA: NEGATIVE
NITRITE: NEGATIVE
Protein, ur: >=300 mg/dL — AB
SPECIFIC GRAVITY, URINE: 1.012 (ref 1.005–1.030)
pH: 5 (ref 5.0–8.0)

## 2016-11-17 LAB — CBC
HEMATOCRIT: 37.1 % (ref 36.0–46.0)
HEMOGLOBIN: 12.9 g/dL (ref 12.0–15.0)
MCH: 30.4 pg (ref 26.0–34.0)
MCHC: 34.8 g/dL (ref 30.0–36.0)
MCV: 87.3 fL (ref 78.0–100.0)
Platelets: 239 10*3/uL (ref 150–400)
RBC: 4.25 MIL/uL (ref 3.87–5.11)
RDW: 13.9 % (ref 11.5–15.5)
WBC: 4.4 10*3/uL (ref 4.0–10.5)

## 2016-11-17 LAB — I-STAT CHEM 8, ED
BUN: 36 mg/dL — ABNORMAL HIGH (ref 6–20)
CALCIUM ION: 0.66 mmol/L — AB (ref 1.15–1.40)
CREATININE: 2.1 mg/dL — AB (ref 0.44–1.00)
Chloride: 103 mmol/L (ref 101–111)
GLUCOSE: 66 mg/dL (ref 65–99)
HCT: 42 % (ref 36.0–46.0)
HEMOGLOBIN: 14.3 g/dL (ref 12.0–15.0)
POTASSIUM: 3 mmol/L — AB (ref 3.5–5.1)
SODIUM: 135 mmol/L (ref 135–145)
TCO2: 17 mmol/L — AB (ref 22–32)

## 2016-11-17 LAB — DIFFERENTIAL
BASOS ABS: 0 10*3/uL (ref 0.0–0.1)
Basophils Relative: 1 %
EOS ABS: 0.1 10*3/uL (ref 0.0–0.7)
Eosinophils Relative: 2 %
LYMPHS ABS: 1.4 10*3/uL (ref 0.7–4.0)
Lymphocytes Relative: 31 %
MONO ABS: 0.1 10*3/uL (ref 0.1–1.0)
MONOS PCT: 3 %
NEUTROS ABS: 2.8 10*3/uL (ref 1.7–7.7)
Neutrophils Relative %: 63 %

## 2016-11-17 LAB — CBG MONITORING, ED
GLUCOSE-CAPILLARY: 222 mg/dL — AB (ref 65–99)
GLUCOSE-CAPILLARY: 62 mg/dL — AB (ref 65–99)
Glucose-Capillary: 81 mg/dL (ref 65–99)
Glucose-Capillary: 161 mg/dL — ABNORMAL HIGH (ref 65–99)

## 2016-11-17 LAB — COMPREHENSIVE METABOLIC PANEL
ALT: 58 U/L — AB (ref 14–54)
AST: 53 U/L — AB (ref 15–41)
Albumin: 2.6 g/dL — ABNORMAL LOW (ref 3.5–5.0)
Alkaline Phosphatase: 115 U/L (ref 38–126)
Anion gap: 11 (ref 5–15)
BILIRUBIN TOTAL: 0.3 mg/dL (ref 0.3–1.2)
BUN: 36 mg/dL — AB (ref 6–20)
CO2: 16 mmol/L — ABNORMAL LOW (ref 22–32)
CREATININE: 2.22 mg/dL — AB (ref 0.44–1.00)
Calcium: 5.5 mg/dL — CL (ref 8.9–10.3)
Chloride: 105 mmol/L (ref 101–111)
GFR, EST AFRICAN AMERICAN: 23 mL/min — AB (ref >60)
GFR, EST NON AFRICAN AMERICAN: 20 mL/min — AB (ref >60)
Glucose, Bld: 64 mg/dL — ABNORMAL LOW (ref 65–99)
Potassium: 2.9 mmol/L — ABNORMAL LOW (ref 3.5–5.1)
Sodium: 132 mmol/L — ABNORMAL LOW (ref 135–145)
TOTAL PROTEIN: 6.1 g/dL — AB (ref 6.5–8.1)

## 2016-11-17 LAB — I-STAT TROPONIN, ED: TROPONIN I, POC: 0.01 ng/mL (ref 0.00–0.08)

## 2016-11-17 LAB — RAPID URINE DRUG SCREEN, HOSP PERFORMED
Amphetamines: NOT DETECTED
BARBITURATES: NOT DETECTED
Benzodiazepines: NOT DETECTED
Cocaine: NOT DETECTED
Opiates: NOT DETECTED
Tetrahydrocannabinol: NOT DETECTED

## 2016-11-17 LAB — PROTIME-INR
INR: 0.97
Prothrombin Time: 12.8 seconds (ref 11.4–15.2)

## 2016-11-17 LAB — APTT: APTT: 30 s (ref 24–36)

## 2016-11-17 LAB — ETHANOL

## 2016-11-17 MED ORDER — SODIUM CHLORIDE 0.9 % IV SOLN
1.0000 g | Freq: Once | INTRAVENOUS | Status: AC
  Administered 2016-11-17: 1 g via INTRAVENOUS
  Filled 2016-11-17: qty 10

## 2016-11-17 MED ORDER — POTASSIUM CHLORIDE CRYS ER 20 MEQ PO TBCR
40.0000 meq | EXTENDED_RELEASE_TABLET | Freq: Once | ORAL | Status: AC
  Administered 2016-11-17: 40 meq via ORAL
  Filled 2016-11-17: qty 2

## 2016-11-17 MED ORDER — DEXTROSE 50 % IV SOLN
INTRAVENOUS | Status: AC
  Administered 2016-11-17: 16:00:00
  Filled 2016-11-17: qty 50

## 2016-11-17 NOTE — Consult Note (Signed)
CODE STROKE NOTE  Referring Physician: Dr. Wilson Singer    Chief Complaint: lethargic and slurry speech  HPI: Carmen [name] is an 83 y.o. female with hx of pacemaker, DM, HLD, HTN, strokes, CKD, metabolic encephalopathy presented as code stroke.  She lives at home and last seen at baseline by granddaughter at 11:30am. Pt was found lethargic and sleepy by family members later. On waking up, pt was found to have slurry speech, not answer questions well. No weakness, facial droop, numbness, seizure, or vision changes reported. EMS called, on arrival, BP 270/90. Pt was sent to ED for code strokeIn ED, glucose level 62. D50 given, patient symptoms much improved. Stat head CT no acute abnormality.  Patient granddaughter arrived after CT head. She stated that she gave patient a morning dose of insulin. However, just found out that her cousin also gave patient a morning dose of insulin after she left home. That explains why patient sugar was low. As per granddaughter, patient sugar level at home was high, receiving NPH twice a day for sugar control.   Granddaughter also stated that patient blood pressure at home was high at 200s. Her hypertension, diabetes are not in good control. Patient had memory issues and her husband also has memory issues that making medication management very difficult.  She has hx of stroke in 10/2011, CT suggested acute right frontal infarcts but also remote right pontine and right thalamic infarcts. As per granddaughter, patient had residual right arm weakness due to stroke many years ago. She was taking Tylenol for right arm pain.  After D50 injection, patient symptoms much improved, granddaughter stated that patient back to her baseline.  LSN: 11:30 AM tPA Given: No: Hypoglycemia, responding to D50 injection  Past Medical History:  Diagnosis Date  . Anxiety   . Arthritis    "everywhere"  . Cataract    "both eyes; blood pressure's always too high to have them fixed"  (09/11/2014)  . Chronic lower back pain   . Claudication in peripheral vascular disease ([place])    02/16/10: Left CIA 9.0x28 Omnilink and REIA 8.0x40 seff expanding Zilver.   right subclavian artery stent 03/18/2008,  . Coronary artery disease   . Herniated lumbar intervertebral disc   . Hypertension   . IDDM (insulin dependent diabetes mellitus) ([place])   . Migraine    "used to have terrible migraines; stopped in the 1990's"  . Pacemaker   . Peripheral vascular disease ([place])   . Pneumonia X 2  . Renal disorder   . Renovascular hypertension     s/p left renal artery stent 12/2007.  S/P balloon angioplasty on 02/16/10 for ISR, BP was  controlled well since then.     . Stroke [facility]) 1999   Stroke and TIA in 1999 with right sided weakness, now with residual right arm weakness (09/11/2014)  . UTI (lower urinary tract infection) 02/2015    Past Surgical History:  Procedure Laterality Date  . ABDOMINAL ANGIOGRAM N/A 07/06/2011   Procedure: ABDOMINAL ANGIOGRAM;  Surgeon: Laverda Page, MD;  Location: [facility] CATH LAB;  Service: Cardiovascular;  Laterality: N/A;  . APPENDECTOMY    . BACK SURGERY    . CAROTID ENDARTERECTOMY Left 1991    Stroke and TIA in 1999 with right sided weakness, now with residual right arm weakness  . CARPAL TUNNEL RELEASE Left   . COLONOSCOPY  07/30/2011   Procedure: COLONOSCOPY;  Surgeon: Inda Castle, MD;  Location: [facility];  Service: Endoscopy;  Laterality: N/A;  gi bleed  . ESOPHAGOGASTRODUODENOSCOPY  07/30/2011   Procedure: ESOPHAGOGASTRODUODENOSCOPY (EGD);  Surgeon: Inda Castle, MD;  Location: [facility];  Service: Endoscopy;  Laterality: N/A;  . EXCISIONAL HEMORRHOIDECTOMY    . INSERT / REPLACE / REMOVE PACEMAKER    . IR FLUORO GUIDE CV LINE RIGHT  07/26/2016  . IR US GUIDE VASC ACCESS RIGHT  07/26/2016  . LOWER EXTREMITY ANGIOGRAM Right 05/29/2012   Procedure: LOWER EXTREMITY ANGIOGRAM;  Surgeon: Laverda Page, MD;  Location: [facility] CATH LAB;  Service:  Cardiovascular;  Laterality: Right;  . LUMBAR LAMINECTOMY     'herniated disc"  . PERIPHERAL VASCULAR CATHETERIZATION N/A 11/05/2014   Procedure: Abdominal Aortogram;  Surgeon: Serafina Mitchell, MD;  Location: [place] CV LAB;  Service: Cardiovascular;  Laterality: N/A;  . PERMANENT PACEMAKER INSERTION Left 06/28/2011   Procedure: PERMANENT PACEMAKER INSERTION;  Surgeon: Deboraha Sprang, MD;  Location: [facility] CATH LAB;  Service: Cardiovascular;  Laterality: Left;  . RENAL ANGIOGRAM N/A 07/06/2011   Procedure: RENAL ANGIOGRAM;  Surgeon: Laverda Page, MD;  Location: [facility] CATH LAB;  Service: Cardiovascular;  Laterality: N/A;  . TONSILLECTOMY    . URETERAL STENT PLACEMENT    . VAGINAL HYSTERECTOMY      Family History  Problem Relation Age of Onset  . Cancer Father        Colon cancer  . Heart attack Mother    Social History:  reports that she has quit smoking. Her smoking use included Cigarettes. She smoked 0.00 packs per day for 70.00 years. She has never used smokeless tobacco. She reports that she does not drink alcohol or use drugs.  Allergies:  Allergies  Allergen Reactions  . Norvasc [Amlodipine Besylate] Swelling  . Peanut-Containing Drug Products Other (See Comments)    Stomach pain    Medications: I have reviewed the patient's current medications.  ROS: ROS was attempted today and was able to be performed.  Systems assessed include - Constitutional, Eyes, HENT, Respiratory, Cardiovascular, Gastrointestinal, Genitourinary, Integument/breast, Hematologic/lymphatic, Musculoskeletal, Neurological, Behavioral/Psych, Endocrine, Allergic/Immunologic - the patient complains of only the following symptoms, and all other reviewed systems are negative.    Physical Examination:  Temp:  [97 F (36.1 C)] 97 F (36.1 C) (09/12 1557) Pulse Rate:  [67] 67 (09/12 1557) Resp:  [18] 18 (09/12 1557) BP: (158)/(103) 158/103 (09/12 1557) SpO2:  [98 %] 98 % (09/12 1557) Weight:  [90 lb (40.8  kg)] 90 lb (40.8 kg) (09/12 1557)  General - Well nourished, well developed, lethargic.  Ophthalmologic - Fundi not visualized due to noncooperation.  Cardiovascular - Regular rate and rhythm.  Neuro - lethargic, eyes open, awake, follows simple commands. Not orientated to time, place, or age. Able to repeat, naming 2/3. Eyes attending to both side, PERRL, blinking to visual threat bilaterally.  facial symmetrical, tongue in middle. LUE 4/5 and RUE chronic 3-/5 proximal, 4/5 distally. BLE 2/5 proximal and 4/5 distally. Sensory symmetrical, coordination not cooperative, gait not tested.   NIH Stroke Scale  Level Of Consciousness 0=Alert; keenly responsive 1=Not alert, but arousable by minor stimulation 2=Not alert, requires repeated stimulation 3=Responds only with reflex movements 1  LOC Questions to Month and Age 46=Answers both questions correctly 1=Answers one question correctly 2=Answers neither question correctly 2  LOC Commands      -Open/Close eyes     -Open/close grip 0=Performs both tasks correctly 1=Performs one task correctly 2=Performs neighter task correctly 0  Best Gaze 0=Normal 1=Partial gaze palsy 2=Forced deviation, or total  gaze paresis 0  Visual 0=No visual loss 1=Partial hemianopia 2=Complete hemianopia 3=Bilateral hemianopia (blind including cortical blindness) 0  Facial Palsy 0=Normal symmetrical movement 1=Minor paralysis (asymmetry) 2=Partial paralysis (lower face) 3=Complete paralysis (upper and lower face) 0  Motor  0=No drift, limb holds posture for full 10 seconds 1=Drift, limb holds posture, no drift to bed 2=Some antigravity effort, cannot maintain posture, drifts to bed 3=No effort against gravity, limb falls 4=No movement Right Arm 2     Leg 3    Left Arm 0     Leg 3  Limb Ataxia 0=Absent 1=Present in one limb 2=Present in two limbs 0  Sensory 0=Normal 1=Mild to moderate sensory loss 2=Severe to total sensory loss 0  Best Language 0=No  aphasia, normal 1=Mild to moderate aphasia 2=Mute, global aphasia 3=Mute, global aphasia 0  Dysarthria 0=Normal 1=Mild to moderate 2=Severe, unintelligible or mute/anarthric 1  Extinction/Neglect 0=No abnormality 1=Extinction to bilateral simultaneous stimulation 2=Profound neglect 0  Total   12      Results for orders placed or performed during the hospital encounter of 11/17/16 (from the past 48 hour(s))  CBG monitoring, ED     Status: Abnormal   Collection Time: 11/17/16  3:32 PM  Result Value Ref Range   Glucose-Capillary 62 (L) 65 - 99 mg/dL   No results found.  Assessment: 83 y.o. female with hx of pacemaker, DM, HLD, HTN, strokes, CKD, metabolic encephalopathy presented as code stroke. Examination showed no focal neurological deficit, NIHSS largely due to lethargy and chronic residual deficits from previous strokes. Found to have low glucose, which explained by the fact that patient received double dose of insulin accidentally at home. After D50 injection, patient much improved and back to baseline. CT head no acute abnormality. Code stroke canceled.   Plan: - Management of DM, HTN as per ER provider - No further recommendations from neurology at this time. - Neurology will sign off. Please call with questions. Thanks for the consult.  Rosalin Hawking, MD PhD Stroke Neurology 11/17/2016 4:35 PM

## 2016-11-17 NOTE — ED Notes (Signed)
Pt family sts she was given insulin twice today by accident

## 2016-11-17 NOTE — ED Notes (Signed)
I-stat chem 8 result given to Dr. Wilson Singer

## 2016-11-17 NOTE — ED Provider Notes (Signed)
[place] DEPT Provider Note   CSN: [id number] Arrival date & time: 11/17/16  1524   An emergency department physician performed an initial assessment on this suspected stroke patient at 1524.  History   Chief Complaint Chief Complaint  Patient presents with  . Code Stroke    HPI Carmen [name] is a 83 y.o. female.  HPI  83 year old female brought in for evaluation of slurred speech and decreased mental status. Last seen normal around 11:30 AM. Found confused earlier before arrival. No known trauma. Patient did inadvertently received 2 doses of insulin today. Her meds are administered by multiple family members. Poor communication 1 family member gave her second dose of insulin and not realizing that she already had it. Blood sugar was in the 60s. Although that is not very low in, her mental status did improve significantly after she was given D50. She is currently at her baseline per multiple family members at the bedside. Patient herself has no acute complaints.  Past Medical History:  Diagnosis Date  . Anxiety   . Arthritis    "everywhere"  . Cataract    "both eyes; blood pressure's always too high to have them fixed" (09/11/2014)  . Chronic lower back pain   . Claudication in peripheral vascular disease ([place])    02/16/10: Left CIA 9.0x28 Omnilink and REIA 8.0x40 seff expanding Zilver.   right subclavian artery stent 03/18/2008,  . Coronary artery disease   . Herniated lumbar intervertebral disc   . Hypertension   . IDDM (insulin dependent diabetes mellitus) ([place])   . Migraine    "used to have terrible migraines; stopped in the 1990's"  . Pacemaker   . Peripheral vascular disease ([place])   . Pneumonia X 2  . Renal disorder   . Renovascular hypertension     s/p left renal artery stent 12/2007.  S/P balloon angioplasty on 02/16/10 for ISR, BP was  controlled well since then.     . Stroke [facility]) 1999   Stroke and TIA in 1999 with right sided weakness, now with residual  right arm weakness (09/11/2014)  . UTI (lower urinary tract infection) 02/2015    Patient Active Problem List   Diagnosis Date Noted  . Hyperglycemia 07/25/2016  . Acute metabolic encephalopathy 27/08/2374  . Hypocalcemia 07/25/2016  . Elevated troponin   . Weakness   . Normal anion gap metabolic acidosis 28/31/5176  . Chest pain 01/13/2016  . Hypoglycemia 10/28/2015  . DNR (do not resuscitate) discussion 02/27/2015  . Palliative care encounter 02/27/2015  . Acute on chronic kidney failure ([place])   . Constipation   . Dehydration, moderate 02/26/2015  . Failure to thrive in adult 02/26/2015  . Acute on chronic renal failure ([place]) 02/26/2015  . Acute cystitis without hematuria   . AKI (acute kidney injury) ([place]) 01/14/2015  . Metabolic acidosis 16/09/3708  . Urinary retention 11/02/2014  . Lower limb ischemia; LLE 11/02/2014  . Hyperkalemia 11/02/2014  . Acute renal failure syndrome ([place])   . Hematuria   . Acute encephalopathy   . Altered mental status 10/28/2014  . Confusion 10/28/2014  . Pulmonary edema 09/11/2014  . Acute respiratory failure with hypoxia ([place]) 09/11/2014  . Acute diastolic heart failure ([place]) 09/11/2014  . Protein-calorie malnutrition, severe ([place]) 08/06/2013  . Hypertensive crisis 08/05/2013  . Hypertensive urgency 08/03/2013  . Weakness generalized 04/06/2013  . Dehydration 04/06/2013  . Transaminitis 04/06/2013  . Type 2 diabetes mellitus with hyperosmolar nonketotic hyperglycemia ([place]) 04/05/2013  . CKD (chronic kidney  disease), stage III 04/05/2013  . HTN (hypertension) 04/05/2013  . Dyslipidemia 04/05/2013  . Abnormal LFTs 04/05/2013  . Cerebral infarction ([place]) 10/15/2011  . [employer] 10/11/2011  . History of CVA (cerebrovascular accident) with associated mild right upper extremity hemiplegia 07/30/2011  . Retrovascular hypertension status post left renal artery stent 2009 and balloon angioplasty for in-stent restenosis 2011  04/05/2011  . Hypokalemia 03/28/2011  . Tobacco abuse 03/28/2011  . DM (diabetes mellitus), type 2, uncontrolled, periph vascular complic ([place]) 03/47/4259  . Peripheral neuropathy 03/28/2011    Past Surgical History:  Procedure Laterality Date  . ABDOMINAL ANGIOGRAM N/A 07/06/2011   Procedure: ABDOMINAL ANGIOGRAM;  Surgeon: Laverda Page, MD;  Location[facility] CATH LAB;  Service: Cardiovascular;  Laterality: N/A;  . APPENDECTOMY    . BACK SURGERY    . CAROTID ENDARTERECTOMY Left 1991    Stroke and TIA in 1999 with right sided weakness, now with residual right arm weakness  . CARPAL TUNNEL RELEASE Left   . COLONOSCOPY  07/30/2011   Procedure: COLONOSCOPY;  Surgeon: Inda Castle, MD;  Location: [place];  Service: Endoscopy;  Laterality: N/A;  gi bleed  . ESOPHAGOGASTRODUODENOSCOPY  07/30/2011   Procedure: ESOPHAGOGASTRODUODENOSCOPY (EGD);  Surgeon: Inda Castle, MD;  Location: [place];  Service: Endoscopy;  Laterality: N/A;  . EXCISIONAL HEMORRHOIDECTOMY    . INSERT / REPLACE / REMOVE PACEMAKER    . IR FLUORO GUIDE CV LINE RIGHT  07/26/2016  . IR US GUIDE VASC ACCESS RIGHT  07/26/2016  . LOWER EXTREMITY ANGIOGRAM Right 05/29/2012   Procedure: LOWER EXTREMITY ANGIOGRAM;  Surgeon: Laverda Page, MD;  Location: [facility] CATH LAB;  Service: Cardiovascular;  Laterality: Right;  . LUMBAR LAMINECTOMY     'herniated disc"  . PERIPHERAL VASCULAR CATHETERIZATION N/A 11/05/2014   Procedure: Abdominal Aortogram;  Surgeon: Serafina Mitchell, MD;  Location: [place] CV LAB;  Service: Cardiovascular;  Laterality: N/A;  . PERMANENT PACEMAKER INSERTION Left 06/28/2011   Procedure: PERMANENT PACEMAKER INSERTION;  Surgeon: Deboraha Sprang, MD;  Location: [facility] CATH LAB;  Service: Cardiovascular;  Laterality: Left;  . RENAL ANGIOGRAM N/A 07/06/2011   Procedure: RENAL ANGIOGRAM;  Surgeon: Laverda Page, MD;  Location: [facility] CATH LAB;  Service: Cardiovascular;  Laterality: N/A;  . TONSILLECTOMY    .  URETERAL STENT PLACEMENT    . VAGINAL HYSTERECTOMY      OB History    No data available       Home Medications    Prior to Admission medications   Medication Sig Start Date End Date Taking? Authorizing Provider  acetaminophen (TYLENOL) 325 MG tablet Take 2 tablets (650 mg total) by mouth every 4 (four) hours as needed for headache or mild pain. 01/15/16   Regalado, Belkys A, MD  aspirin 325 MG tablet Take 325 mg by mouth daily.    [provider]  calcium-vitamin D (OSCAL WITH D) 500-200 MG-UNIT tablet Take 2 tablets by mouth 4 (four) times daily. 07/27/16   Eugenie Filler, MD  clopidogrel (PLAVIX) 75 MG tablet Take 75 mg by mouth daily.    [provider]  furosemide (LASIX) 20 MG tablet Take 1 tablet (20 mg total) by mouth daily. Resume at lower dose in 2 days. 07/29/16   Eugenie Filler, MD  Garlic 563 MG CAPS Take 500 mg by mouth daily.     [provider]  insulin NPH-regular Human (NOVOLIN 70/30) (70-30) 100 UNIT/ML injection Inject 5 Units into the skin  2 (two) times daily. 10/30/15   Janece Canterbury, MD  isosorbide mononitrate (IMDUR) 120 MG 24 hr tablet Take 1 tablet (120 mg total) by mouth daily. Resume in 2 days. 07/29/16   Eugenie Filler, MD  metoprolol tartrate (LOPRESSOR) 25 MG tablet Take 25 mg by mouth 2 (two) times daily.    [provider]  Omega-3 Fatty Acids (FISH OIL) 1000 MG CAPS Take 1,000 mg by mouth 2 (two) times daily.     [provider]  [place] Calcium 500 MG TABS Take 500 mg by mouth.    [provider]  potassium chloride SA (K-DUR,KLOR-CON) 20 MEQ tablet Take 1 tablet (20 mEq total) by mouth daily. 07/29/16   Eugenie Filler, MD    Family History Family History  Problem Relation Age of Onset  . Cancer Father        Colon cancer  . Heart attack Mother     Social History Social History  Substance Use Topics  . Smoking status: Former Smoker    Packs/day: 0.00    Years: 70.00     Types: Cigarettes  . Smokeless tobacco: Never Used     Comment: 09/11/2014 "I used to smoke very heavy; down to 1 cigarette/day"  . Alcohol use No     Comment: "stopped drinking back in the 1970's"     Allergies   Norvasc [amlodipine besylate] and Peanut-containing drug products   Review of Systems Review of Systems  All systems reviewed and negative, other than as noted in HPI.  Physical Exam Updated Vital Signs BP (!) 177/99 (BP Location: Right Arm)   Pulse 73   Temp (!) 97 F (36.1 C) (Oral)   Resp 18   Ht 5\' 4"  (1.626 m)   Wt 40.8 kg (90 lb)   SpO2 98%   BMI 15.45 kg/m   Physical Exam  Constitutional: She appears well-developed and well-nourished. No distress.  HENT:  Head: Normocephalic and atraumatic.  Eyes: Conjunctivae are normal. Right eye exhibits no discharge. Left eye exhibits no discharge.  Neck: Neck supple.  Cardiovascular: Normal rate, regular rhythm and normal heart sounds.  Exam reveals no gallop and no friction rub.   No murmur heard. Pulmonary/Chest: Effort normal and breath sounds normal. No respiratory distress.  Abdominal: Soft. She exhibits no distension. There is no tenderness.  Musculoskeletal: She exhibits no edema or tenderness.  Neurological: She is alert.  Her speech is slurred. She is not wearing her dentures. Multiple family member state that this is her normal sounding voice. She is responding appropriate to questions and following all commands. Cranial nerves II through XII are intact. Strength is 5 out of 5 bilateral upper lower extremities. Sensation is intact to light touch.  Skin: Skin is warm and dry.  Psychiatric: She has a normal mood and affect. Her behavior is normal. Thought content normal.  Nursing note and vitals reviewed.    ED Treatments / Results  Labs (all labs ordered are listed, but only abnormal results are displayed) Labs Reviewed  COMPREHENSIVE METABOLIC PANEL - Abnormal; Notable for the following:       Result  Value   Sodium 132 (*)    Potassium 2.9 (*)    CO2 16 (*)    Glucose, Bld 64 (*)    BUN 36 (*)    Creatinine, Ser 2.22 (*)    Calcium 5.5 (*)    Total Protein 6.1 (*)    Albumin 2.6 (*)    AST  53 (*)    ALT 58 (*)    GFR calc non Af Amer 20 (*)    GFR calc Af Amer 23 (*)    All other components within normal limits  URINALYSIS, ROUTINE W REFLEX MICROSCOPIC - Abnormal; Notable for the following:    APPearance HAZY (*)    Glucose, UA 50 (*)    Hgb urine dipstick SMALL (*)    Protein, ur >=300 (*)    Bacteria, UA RARE (*)    Squamous Epithelial / LPF 0-5 (*)    All other components within normal limits  I-STAT CHEM 8, ED - Abnormal; Notable for the following:    Potassium 3.0 (*)    BUN 36 (*)    Creatinine, Ser 2.10 (*)    Calcium, Ion 0.66 (*)    TCO2 17 (*)    All other components within normal limits  CBG MONITORING, ED - Abnormal; Notable for the following:    Glucose-Capillary 62 (*)    All other components within normal limits  CBG MONITORING, ED - Abnormal; Notable for the following:    Glucose-Capillary 222 (*)    All other components within normal limits  ETHANOL  PROTIME-INR  APTT  CBC  DIFFERENTIAL  RAPID URINE DRUG SCREEN, HOSP PERFORMED  I-STAT TROPONIN, ED  CBG MONITORING, ED  CBG MONITORING, ED    EKG  EKG Interpretation None       Radiology Ct Head Code Stroke Wo Contrast  Addendum Date: 11/17/2016   ADDENDUM REPORT: 11/17/2016 16:03 ADDENDUM: These results were called by telephone at the time of interpretation on 11/17/2016 at 4:03 pm to Dr. Cheral Marker , who verbally acknowledged these results. Electronically Signed   By: Franchot Gallo M.D.   On: 11/17/2016 16:03   Result Date: 11/17/2016 CLINICAL DATA:  Code stroke. Focal neuro deficit less than 6 hours. Altered mental status and slurred speech EXAM: CT HEAD WITHOUT CONTRAST TECHNIQUE: Contiguous axial images were obtained from the base of the skull through the vertex without intravenous  contrast. COMPARISON:  CT 07/25/2016 FINDINGS: Brain: Moderate atrophy unchanged. Chronic deep white matter infarct on the left is unchanged with volume loss and enlargement of left ventricle unchanged. Chronic microvascular ischemia in the white matter bilaterally. Vascular: Negative for hyperdense vessel. Skull: Negative Sinuses/Orbits: Negative Other: None ASPECTS ([place] Stroke Program Early CT Score) - Ganglionic level infarction (caudate, lentiform nuclei, internal capsule, insula, M1-M3 cortex): 7 - Supraganglionic infarction (M4-M6 cortex): 3 Total score (0-10 with 10 being normal): 10 IMPRESSION: 1. Atrophy and chronic microvascular ischemia. No acute abnormality and no change from prior study 2. ASPECTS is 10 Electronically Signed: By: Franchot Gallo M.D. On: 11/17/2016 16:01    Procedures Procedures (including critical care time)  Medications Ordered in ED Medications  potassium chloride SA (K-DUR,KLOR-CON) CR tablet 40 mEq (not administered)  dextrose 50 % solution (  Given 11/17/16 1542)     Initial Impression / Assessment and Plan / ED Course  I have reviewed the triage vital signs and the nursing notes.  Pertinent labs & imaging results that were available during my care of the patient were reviewed by me and considered in my medical decision making (see chart for details).     Symptoms likley 2/2 hypoglycemia. Now resolved with improvement of glucose. Multiple family members at bedside and say she is at baseline. Glucose stable. Ate peanut butter and drank OJ prior to leave. No further insulin today. Family advised that additional snacks ok this evening.  Plenty of fluids.   Final Clinical Impressions(s) / ED Diagnoses   Final diagnoses:  Hypoglycemia  Hypocalcemia    New Prescriptions New Prescriptions   No medications on file     Virgel Manifold, MD 11/27/16 1042

## 2016-11-17 NOTE — ED Notes (Signed)
Gave pt orange juice and peanut butter.

## 2016-11-17 NOTE — ED Provider Notes (Signed)
MSE was initiated and I personally evaluated the patient and placed orders (if any) at  3:29 PM on November 17, 2016.  The patient airway appears stable, code stroke order set initiated, so that the remainder of the MSE may be completed by another provider.   Langford Carias, Corene Cornea, MD 11/18/16 [phone]

## 2016-11-17 NOTE — Code Documentation (Signed)
83yo female arriving to [facility] via [place] at 67.  Patient from home where she was noted to be altered with slurred speech.  LKW 0800 per EMS.  Stroke team to the bedside on patient arrival.  CBG 62 on arrival and patient treated with D50 IVP.  Patient to CT with team.  CT completed.  NIHSS 5, see documentation for details and code stroke times.  Patient with improvement in mental status following D50 administration.  Granddaughter to the bedside and reports patient was LKW at 78.  Patient was inadvertently given Insulin dose twice by separate family members.  No acute stroke treatment at this time.  Code stroke canceled.  Bedside handoff with ED RN Janett Billow.

## 2016-12-22 IMAGING — CR DG CHEST 1V PORT
1 series · 1 of 1 positions shown · non-contrast
Comparison: 08/03/2013

CLINICAL DATA: Worsening shortness of breath for past week.
Wheezing. Decreased breath sounds on left.

EXAM:
PORTABLE CHEST - 1 VIEW

[AP]
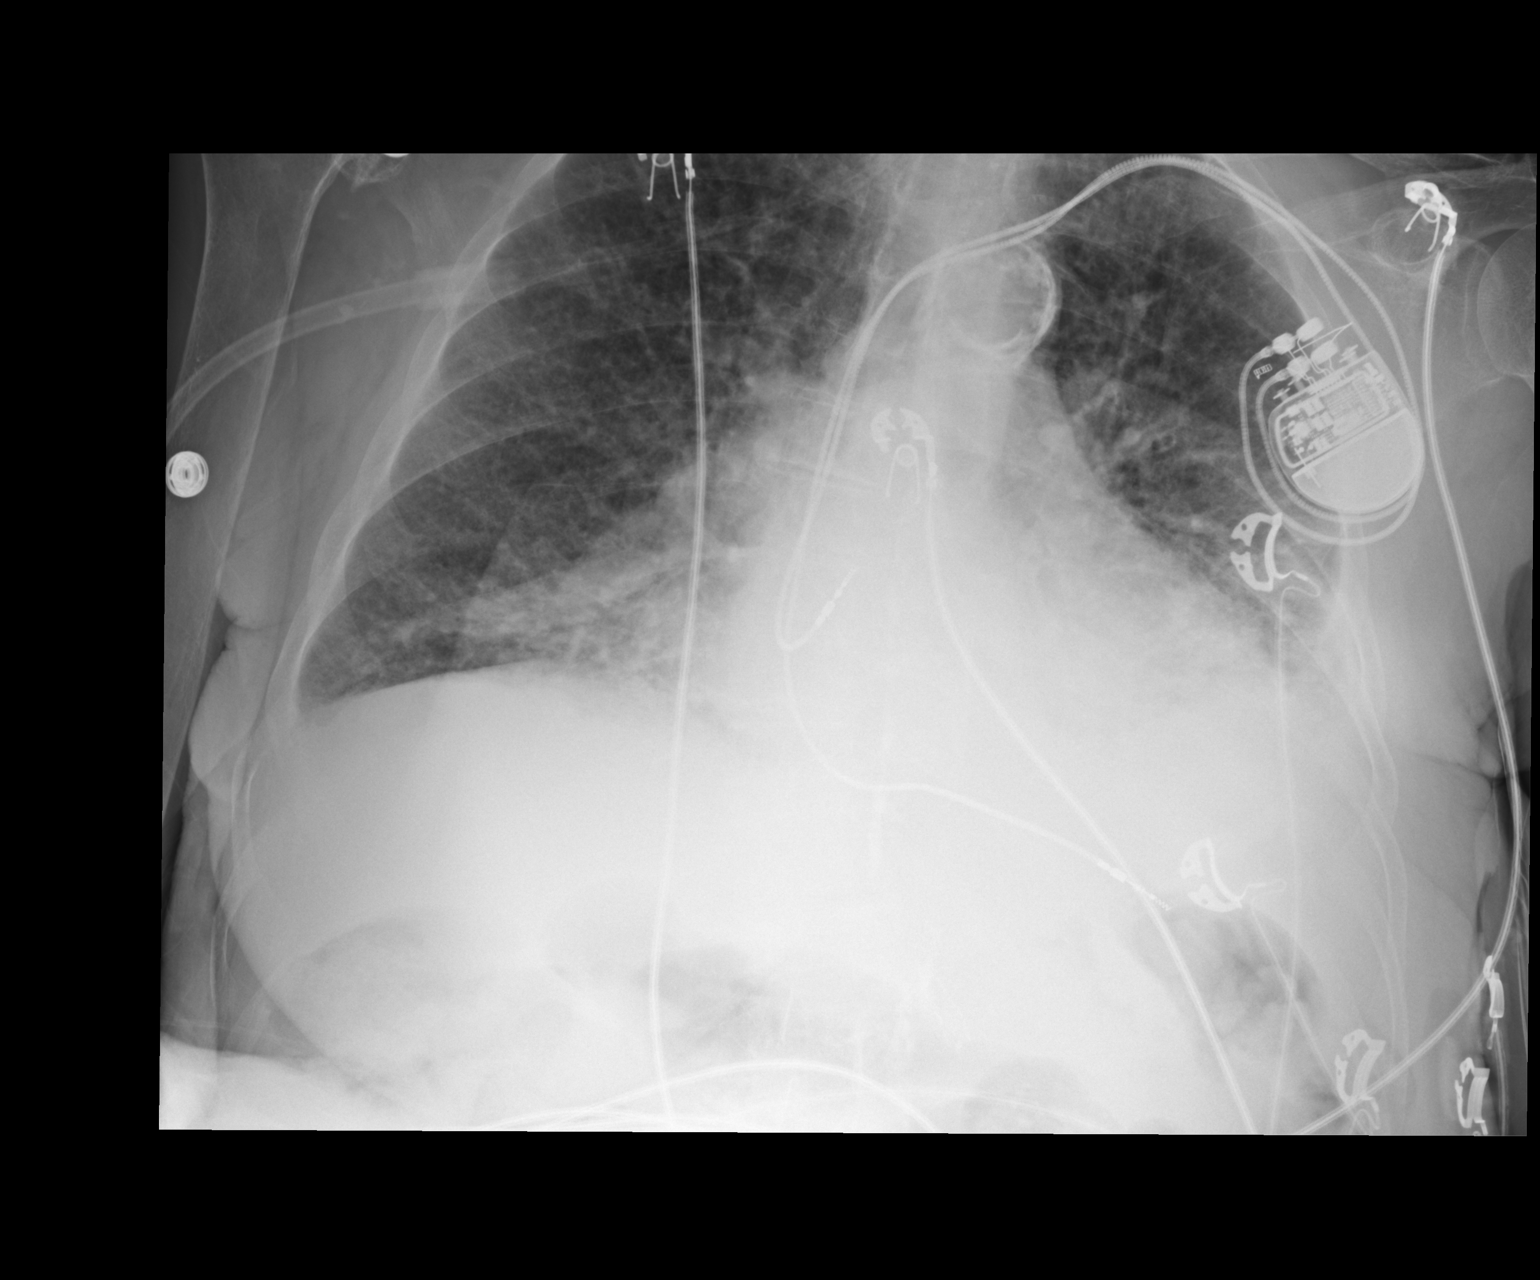

[1 of 1 positions shown; findings below may reference images not displayed]

FINDINGS: Dual lead transvenous pacemaker remains in appropriate position.
Increased diffuse interstitial infiltrates and symmetric bibasilar
airspace disease seen, consistent with diffuse pulmonary edema.
Probable small left pleural effusion present. Heart size remains
within normal limits.
IMPRESSION: Diffuse pulmonary edema and probable small left pleural effusion.

## 2017-01-04 DIAGNOSIS — Z45018 Encounter for adjustment and management of other part of cardiac pacemaker: Secondary | ICD-10-CM | POA: Diagnosis not present

## 2017-01-04 DIAGNOSIS — Z950 Presence of cardiac pacemaker: Secondary | ICD-10-CM | POA: Diagnosis not present

## 2017-01-04 DIAGNOSIS — I495 Sick sinus syndrome: Secondary | ICD-10-CM | POA: Diagnosis not present

## 2017-02-04 DIAGNOSIS — I1310 Hypertensive heart and chronic kidney disease without heart failure, with stage 1 through stage 4 chronic kidney disease, or unspecified chronic kidney disease: Secondary | ICD-10-CM | POA: Diagnosis not present

## 2017-02-04 DIAGNOSIS — N184 Chronic kidney disease, stage 4 (severe): Secondary | ICD-10-CM | POA: Diagnosis not present

## 2017-02-04 DIAGNOSIS — E1122 Type 2 diabetes mellitus with diabetic chronic kidney disease: Secondary | ICD-10-CM | POA: Diagnosis not present

## 2017-02-04 DIAGNOSIS — M25511 Pain in right shoulder: Secondary | ICD-10-CM | POA: Diagnosis not present

## 2017-02-04 DIAGNOSIS — N08 Glomerular disorders in diseases classified elsewhere: Secondary | ICD-10-CM | POA: Diagnosis not present

## 2017-04-12 DIAGNOSIS — Z45018 Encounter for adjustment and management of other part of cardiac pacemaker: Secondary | ICD-10-CM | POA: Diagnosis not present

## 2017-04-12 DIAGNOSIS — Z950 Presence of cardiac pacemaker: Secondary | ICD-10-CM | POA: Diagnosis not present

## 2017-04-12 DIAGNOSIS — I495 Sick sinus syndrome: Secondary | ICD-10-CM | POA: Diagnosis not present

## 2017-04-16 ENCOUNTER — Emergency Department (HOSPITAL_COMMUNITY)
Admission: EM | Admit: 2017-04-16 | Discharge: 2017-04-16 | Disposition: A | Discharge status: Home / Self Care | Payer: Medicare HMO | Attending: Emergency Medicine | Admitting: Emergency Medicine

## 2017-04-16 ENCOUNTER — Encounter (HOSPITAL_COMMUNITY): Payer: Self-pay | Admitting: Emergency Medicine

## 2017-04-16 DIAGNOSIS — N183 Chronic kidney disease, stage 3 (moderate): Secondary | ICD-10-CM | POA: Insufficient documentation

## 2017-04-16 DIAGNOSIS — K5900 Constipation, unspecified: Secondary | ICD-10-CM | POA: Insufficient documentation

## 2017-04-16 DIAGNOSIS — Z79899 Other long term (current) drug therapy: Secondary | ICD-10-CM | POA: Insufficient documentation

## 2017-04-16 DIAGNOSIS — Z87891 Personal history of nicotine dependence: Secondary | ICD-10-CM | POA: Diagnosis not present

## 2017-04-16 DIAGNOSIS — Z7982 Long term (current) use of aspirin: Secondary | ICD-10-CM | POA: Insufficient documentation

## 2017-04-16 DIAGNOSIS — Z8673 Personal history of transient ischemic attack (TIA), and cerebral infarction without residual deficits: Secondary | ICD-10-CM | POA: Insufficient documentation

## 2017-04-16 DIAGNOSIS — Z794 Long term (current) use of insulin: Secondary | ICD-10-CM | POA: Insufficient documentation

## 2017-04-16 DIAGNOSIS — E1122 Type 2 diabetes mellitus with diabetic chronic kidney disease: Secondary | ICD-10-CM | POA: Diagnosis not present

## 2017-04-16 DIAGNOSIS — E1140 Type 2 diabetes mellitus with diabetic neuropathy, unspecified: Secondary | ICD-10-CM | POA: Diagnosis not present

## 2017-04-16 DIAGNOSIS — G8911 Acute pain due to trauma: Secondary | ICD-10-CM | POA: Diagnosis not present

## 2017-04-16 DIAGNOSIS — R100 Acute abdomen: Secondary | ICD-10-CM | POA: Diagnosis not present

## 2017-04-16 DIAGNOSIS — Z91010 Allergy to peanuts: Secondary | ICD-10-CM | POA: Insufficient documentation

## 2017-04-16 DIAGNOSIS — R109 Unspecified abdominal pain: Secondary | ICD-10-CM | POA: Insufficient documentation

## 2017-04-16 DIAGNOSIS — I5030 Unspecified diastolic (congestive) heart failure: Secondary | ICD-10-CM | POA: Insufficient documentation

## 2017-04-16 DIAGNOSIS — I130 Hypertensive heart and chronic kidney disease with heart failure and stage 1 through stage 4 chronic kidney disease, or unspecified chronic kidney disease: Secondary | ICD-10-CM | POA: Diagnosis not present

## 2017-04-16 DIAGNOSIS — I2510 Atherosclerotic heart disease of native coronary artery without angina pectoris: Secondary | ICD-10-CM | POA: Insufficient documentation

## 2017-04-16 DIAGNOSIS — Z950 Presence of cardiac pacemaker: Secondary | ICD-10-CM | POA: Insufficient documentation

## 2017-04-16 MED ORDER — DOCUSATE SODIUM 100 MG PO CAPS: 100.0000 mg | ORAL_CAPSULE | Freq: Every day | ORAL | 0 refills | Status: DC

## 2017-04-16 MED ORDER — POLYETHYLENE GLYCOL 3350 17 GM/SCOOP PO POWD: 17.0000 g | Freq: Two times a day (BID) | ORAL | 0 refills | Status: DC

## 2017-04-16 NOTE — ED Provider Notes (Signed)
Medical screening examination/treatment/procedure(s) were conducted as a shared visit with non-physician practitioner(s) and myself.  I personally evaluated the patient during the encounter.   EKG Interpretation None      Patient is a 84 year old female who presented with constipation.  Had a very large bowel movement in the waiting room and states she is feeling "great".  Abdominal exam completely benign without tenderness or distention.  No vomiting.  No fever.  Will discharge home on Colace twice daily.  Recommend increase water and fiber intake.   Jaylina Ramdass, Delice Bison, DO 04/16/17 [phone]

## 2017-04-16 NOTE — ED Notes (Signed)
Patient denies having any problems and states that she is ready to go home.

## 2017-04-16 NOTE — ED Provider Notes (Signed)
[facility] EMERGENCY DEPARTMENT Provider Note   CSN: [id number] Arrival date & time: 04/16/17  0128     History   Chief Complaint Chief Complaint  Patient presents with  . Constipation    HPI Carmen [name] is a 84 y.o. female.  Patient presents to the emergency department with a chief complaint of abdominal pain.  She states that she called EMS because she was having pain in her bottom.  She had a large bowel movement in the waiting room.  She states that she now feels fine.  She denies any pain whatsoever.  She has not taken anything for symptoms.  She has no symptoms now.   The history is provided by the patient. No language interpreter was used.    Past Medical History:  Diagnosis Date  . Anxiety   . Arthritis    "everywhere"  . Cataract    "both eyes; blood pressure's always too high to have them fixed" (09/11/2014)  . Chronic lower back pain   . Claudication in peripheral vascular disease ([place])    02/16/10: Left CIA 9.0x28 Omnilink and REIA 8.0x40 seff expanding Zilver.   right subclavian artery stent 03/18/2008,  . Coronary artery disease   . Herniated lumbar intervertebral disc   . Hypertension   . IDDM (insulin dependent diabetes mellitus) ([place])   . Migraine    "used to have terrible migraines; stopped in the 1990's"  . Pacemaker   . Peripheral vascular disease ([place])   . Pneumonia X 2  . Renal disorder   . Renovascular hypertension     s/p left renal artery stent 12/2007.  S/P balloon angioplasty on 02/16/10 for ISR, BP was  controlled well since then.     . Stroke [facility]) 1999   Stroke and TIA in 1999 with right sided weakness, now with residual right arm weakness (09/11/2014)  . UTI (lower urinary tract infection) 02/2015    Patient Active Problem List   Diagnosis Date Noted  . Hyperglycemia 07/25/2016  . Acute metabolic encephalopathy 37/85/8850  . Hypocalcemia 07/25/2016  . Elevated troponin   . Weakness   . Normal anion gap  metabolic acidosis 27/74/1287  . Chest pain 01/13/2016  . Hypoglycemia 10/28/2015  . DNR (do not resuscitate) discussion 02/27/2015  . Palliative care encounter 02/27/2015  . Acute on chronic kidney failure ([place])   . Constipation   . Dehydration, moderate 02/26/2015  . Failure to thrive in adult 02/26/2015  . Acute on chronic renal failure ([place]) 02/26/2015  . Acute cystitis without hematuria   . AKI (acute kidney injury) ([place]) 01/14/2015  . Metabolic acidosis 86/76/7209  . Urinary retention 11/02/2014  . Lower limb ischemia; LLE 11/02/2014  . Hyperkalemia 11/02/2014  . Acute renal failure syndrome ([place])   . Hematuria   . Acute encephalopathy   . Altered mental status 10/28/2014  . Confusion 10/28/2014  . Pulmonary edema 09/11/2014  . Acute respiratory failure with hypoxia ([place]) 09/11/2014  . Acute diastolic heart failure ([place]) 09/11/2014  . Protein-calorie malnutrition, severe ([place]) 08/06/2013  . Hypertensive crisis 08/05/2013  . Hypertensive urgency 08/03/2013  . Weakness generalized 04/06/2013  . Dehydration 04/06/2013  . Transaminitis 04/06/2013  . Type 2 diabetes mellitus with hyperosmolar nonketotic hyperglycemia ([place]) 04/05/2013  . CKD (chronic kidney disease), stage III ([place]) 04/05/2013  . HTN (hypertension) 04/05/2013  . Dyslipidemia 04/05/2013  . Abnormal LFTs 04/05/2013  . Cerebral infarction ([place]) 10/15/2011  . [employer] 10/11/2011  . History of CVA (cerebrovascular  accident) with associated mild right upper extremity hemiplegia 07/30/2011  . Retrovascular hypertension status post left renal artery stent 2009 and balloon angioplasty for in-stent restenosis 2011 04/05/2011  . Hypokalemia 03/28/2011  . Tobacco abuse 03/28/2011  . DM (diabetes mellitus), type 2, uncontrolled, periph vascular complic ([place]) 40/81/4481  . Peripheral neuropathy 03/28/2011    Past Surgical History:  Procedure Laterality Date  . ABDOMINAL ANGIOGRAM N/A 07/06/2011    Procedure: ABDOMINAL ANGIOGRAM;  Surgeon: Laverda Page, MD;  Location: [facility] CATH LAB;  Service: Cardiovascular;  Laterality: N/A;  . APPENDECTOMY    . BACK SURGERY    . CAROTID ENDARTERECTOMY Left 1991    Stroke and TIA in 1999 with right sided weakness, now with residual right arm weakness  . CARPAL TUNNEL RELEASE Left   . COLONOSCOPY  07/30/2011   Procedure: COLONOSCOPY;  Surgeon: Inda Castle, MD;  Location: [facility];  Service: Endoscopy;  Laterality: N/A;  gi bleed  . ESOPHAGOGASTRODUODENOSCOPY  07/30/2011   Procedure: ESOPHAGOGASTRODUODENOSCOPY (EGD);  Surgeon: Inda Castle, MD;  Location: [facility];  Service: Endoscopy;  Laterality: N/A;  . EXCISIONAL HEMORRHOIDECTOMY    . INSERT / REPLACE / REMOVE PACEMAKER    . IR FLUORO GUIDE CV LINE RIGHT  07/26/2016  . IR US GUIDE VASC ACCESS RIGHT  07/26/2016  . LOWER EXTREMITY ANGIOGRAM Right 05/29/2012   Procedure: LOWER EXTREMITY ANGIOGRAM;  Surgeon: Laverda Page, MD;  Location: [facility] CATH LAB;  Service: Cardiovascular;  Laterality: Right;  . LUMBAR LAMINECTOMY     'herniated disc"  . PERIPHERAL VASCULAR CATHETERIZATION N/A 11/05/2014   Procedure: Abdominal Aortogram;  Surgeon: Serafina Mitchell, MD;  Location: [place] CV LAB;  Service: Cardiovascular;  Laterality: N/A;  . PERMANENT PACEMAKER INSERTION Left 06/28/2011   Procedure: PERMANENT PACEMAKER INSERTION;  Surgeon: Deboraha Sprang, MD;  Location: [facility] CATH LAB;  Service: Cardiovascular;  Laterality: Left;  . RENAL ANGIOGRAM N/A 07/06/2011   Procedure: RENAL ANGIOGRAM;  Surgeon: Laverda Page, MD;  Location: [facility] CATH LAB;  Service: Cardiovascular;  Laterality: N/A;  . TONSILLECTOMY    . URETERAL STENT PLACEMENT    . VAGINAL HYSTERECTOMY      OB History    No data available       Home Medications    Prior to Admission medications   Medication Sig Start Date End Date Taking? Authorizing Provider  acetaminophen (TYLENOL) 325 MG tablet Take 2 tablets (650 mg total)  by mouth every 4 (four) hours as needed for headache or mild pain. 01/15/16   Regalado, Belkys A, MD  aspirin 325 MG tablet Take 325 mg by mouth daily.    [provider]  calcium-vitamin D (OSCAL WITH D) 500-200 MG-UNIT tablet Take 2 tablets by mouth 4 (four) times daily. 07/27/16   Eugenie Filler, MD  clopidogrel (PLAVIX) 75 MG tablet Take 75 mg by mouth daily.    [provider]  furosemide (LASIX) 20 MG tablet Take 1 tablet (20 mg total) by mouth daily. Resume at lower dose in 2 days. 07/29/16   Eugenie Filler, MD  Garlic 856 MG CAPS Take 500 mg by mouth daily.     [provider]  insulin NPH-regular Human (NOVOLIN 70/30) (70-30) 100 UNIT/ML injection Inject 5 Units into the skin 2 (two) times daily. 10/30/15   Janece Canterbury, MD  isosorbide mononitrate (IMDUR) 120 MG 24 hr tablet Take 1 tablet (120 mg total) by mouth daily. Resume in 2 days. 07/29/16  Eugenie Filler, MD  metoprolol tartrate (LOPRESSOR) 25 MG tablet Take 25 mg by mouth 2 (two) times daily.    [provider]  Omega-3 Fatty Acids (FISH OIL) 1000 MG CAPS Take 1,000 mg by mouth 2 (two) times daily.     [provider]  [place] Calcium 500 MG TABS Take 500 mg by mouth.    [provider]  potassium chloride SA (K-DUR,KLOR-CON) 20 MEQ tablet Take 1 tablet (20 mEq total) by mouth daily. 07/29/16   Eugenie Filler, MD  aliskiren (TEKTURNA) 150 MG tablet Take 150 mg by mouth daily.  05/29/11  [provider]  gabapentin (NEURONTIN) 100 MG capsule Take 1 capsule (100 mg total) by mouth 3 (three) times daily. 04/06/11 05/29/11  Nita Sells, MD    Family History Family History  Problem Relation Age of Onset  . Cancer Father        Colon cancer  . Heart attack Mother     Social History Social History   Tobacco Use  . Smoking status: Former Smoker    Packs/day: 0.00    Years: 70.00    Pack years: 0.00    Types: Cigarettes  . Smokeless  tobacco: Never Used  . Tobacco comment: 09/11/2014 "I used to smoke very heavy; down to 1 cigarette/day"  Substance Use Topics  . Alcohol use: No    Comment: "stopped drinking back in the 1970's"  . Drug use: No     Allergies   Norvasc [amlodipine besylate] and Peanut-containing drug products   Review of Systems Review of Systems  All other systems reviewed and are negative.    Physical Exam Updated Vital Signs BP (!) 174/67 (BP Location: Left Arm)   Pulse 66   Temp 98.2 F (36.8 C) (Oral)   Resp 19   Ht 5\' 4"  (1.626 m)   Wt 40.8 kg (90 lb)   SpO2 98%   BMI 15.45 kg/m   Physical Exam  Constitutional: She is oriented to person, place, and time. She appears well-developed and well-nourished.  HENT:  Head: Normocephalic and atraumatic.  Eyes: Conjunctivae and EOM are normal. Pupils are equal, round, and reactive to light.  Neck: Normal range of motion. Neck supple.  Cardiovascular: Normal rate and regular rhythm. Exam reveals no gallop and no friction rub.  No murmur heard. Pulmonary/Chest: Effort normal and breath sounds normal. No respiratory distress. She has no wheezes. She has no rales. She exhibits no tenderness.  Abdominal: Soft. Bowel sounds are normal. She exhibits no distension and no mass. There is no tenderness. There is no rebound and no guarding.  No focal abdominal tenderness, no RLQ tenderness or pain at McBurney's point, no RUQ tenderness or Murphy's sign, no left-sided abdominal tenderness, no fluid wave, or signs of peritonitis   Musculoskeletal: Normal range of motion. She exhibits no edema or tenderness.  Neurological: She is alert and oriented to person, place, and time.  Skin: Skin is warm and dry.  Psychiatric: She has a normal mood and affect. Her behavior is normal. Judgment and thought content normal.  Nursing note and vitals reviewed.    ED Treatments / Results  Labs (all labs ordered are listed, but only abnormal results are  displayed) Labs Reviewed - No data to display  EKG  EKG Interpretation None       Radiology No results found.  Procedures Procedures (including critical care time)  Medications Ordered in ED Medications - No data to display  Initial Impression / Assessment and Plan / ED Course  I have reviewed the triage vital signs and the nursing notes.  Pertinent labs & imaging results that were available during my care of the patient were reviewed by me and considered in my medical decision making (see chart for details).     Patient with presumed constipation.  She had pain in her bottom and called EMS.  She had a large bowel movement in the waiting room, and now states that she feels fine.  She denies any symptoms whatsoever.  She has no focal abdominal tenderness.  Her vital signs are stable.  She is alert and oriented.  She is here with her granddaughter, who believes that she was constipated.  Seen by and discussed with Dr. Leonides Schanz, who agrees with plan for discharge.  Discussed drinking more water.  Will prescribe some miralax.  Final Clinical Impressions(s) / ED Diagnoses   Final diagnoses:  Constipation, unspecified constipation type    ED Discharge Orders        Ordered    polyethylene glycol powder (GLYCOLAX/MIRALAX) powder  2 times daily,   Status:  Discontinued     04/16/17 0445    docusate sodium (COLACE) 100 MG capsule  Daily     04/16/17 0447       Montine Circle, PA-C 04/16/17 [phone]

## 2017-04-16 NOTE — ED Notes (Signed)
Pt was taken to pod A to be cleaned up. When pt pull up was taken off pt had a large amount of stool in brief. The amount was larger than softball but smaller than a volleyball. Pt was cleaned up and placed in a diaper and blue scrubs.

## 2017-04-16 NOTE — ED Notes (Signed)
Pt had urinary incontinence and large "fist size" hard bowel movement in waiting room floor.  Pt taken to room to be cleaned and placed in paper scrubs.

## 2017-04-16 NOTE — ED Triage Notes (Signed)
BIB [facility] from home, called out because her "bottom was hurting" Pt had pain after having a BM d/t stool being hard.

## 2017-05-06 ENCOUNTER — Emergency Department (HOSPITAL_COMMUNITY): Payer: Medicare HMO

## 2017-05-06 ENCOUNTER — Inpatient Hospital Stay (HOSPITAL_COMMUNITY)
Admission: EM | Admit: 2017-05-06 | Discharge: 2017-05-10 | DRG: 193 | Disposition: A | Discharge status: Home / Self Care | Payer: Medicare HMO | Attending: Internal Medicine | Admitting: Internal Medicine

## 2017-05-06 DIAGNOSIS — Z87891 Personal history of nicotine dependence: Secondary | ICD-10-CM | POA: Diagnosis not present

## 2017-05-06 DIAGNOSIS — E1122 Type 2 diabetes mellitus with diabetic chronic kidney disease: Secondary | ICD-10-CM | POA: Diagnosis not present

## 2017-05-06 DIAGNOSIS — I129 Hypertensive chronic kidney disease with stage 1 through stage 4 chronic kidney disease, or unspecified chronic kidney disease: Secondary | ICD-10-CM | POA: Diagnosis present

## 2017-05-06 DIAGNOSIS — R5383 Other fatigue: Secondary | ICD-10-CM | POA: Diagnosis not present

## 2017-05-06 DIAGNOSIS — Z8673 Personal history of transient ischemic attack (TIA), and cerebral infarction without residual deficits: Secondary | ICD-10-CM | POA: Diagnosis not present

## 2017-05-06 DIAGNOSIS — Z66 Do not resuscitate: Secondary | ICD-10-CM | POA: Diagnosis not present

## 2017-05-06 DIAGNOSIS — I69331 Monoplegia of upper limb following cerebral infarction affecting right dominant side: Secondary | ICD-10-CM

## 2017-05-06 DIAGNOSIS — E871 Hypo-osmolality and hyponatremia: Secondary | ICD-10-CM | POA: Diagnosis present

## 2017-05-06 DIAGNOSIS — N179 Acute kidney failure, unspecified: Secondary | ICD-10-CM | POA: Diagnosis not present

## 2017-05-06 DIAGNOSIS — Z960 Presence of urogenital implants: Secondary | ICD-10-CM | POA: Diagnosis present

## 2017-05-06 DIAGNOSIS — Z794 Long term (current) use of insulin: Secondary | ICD-10-CM

## 2017-05-06 DIAGNOSIS — Z91010 Allergy to peanuts: Secondary | ICD-10-CM

## 2017-05-06 DIAGNOSIS — E1165 Type 2 diabetes mellitus with hyperglycemia: Secondary | ICD-10-CM | POA: Diagnosis not present

## 2017-05-06 DIAGNOSIS — R531 Weakness: Secondary | ICD-10-CM | POA: Diagnosis not present

## 2017-05-06 DIAGNOSIS — E876 Hypokalemia: Secondary | ICD-10-CM | POA: Diagnosis present

## 2017-05-06 DIAGNOSIS — I10 Essential (primary) hypertension: Secondary | ICD-10-CM | POA: Diagnosis present

## 2017-05-06 DIAGNOSIS — Z681 Body mass index (BMI) 19 or less, adult: Secondary | ICD-10-CM | POA: Diagnosis not present

## 2017-05-06 DIAGNOSIS — Z7902 Long term (current) use of antithrombotics/antiplatelets: Secondary | ICD-10-CM

## 2017-05-06 DIAGNOSIS — N184 Chronic kidney disease, stage 4 (severe): Secondary | ICD-10-CM

## 2017-05-06 DIAGNOSIS — E119 Type 2 diabetes mellitus without complications: Secondary | ICD-10-CM

## 2017-05-06 DIAGNOSIS — R509 Fever, unspecified: Secondary | ICD-10-CM | POA: Diagnosis not present

## 2017-05-06 DIAGNOSIS — R627 Adult failure to thrive: Secondary | ICD-10-CM | POA: Diagnosis present

## 2017-05-06 DIAGNOSIS — K5900 Constipation, unspecified: Secondary | ICD-10-CM | POA: Diagnosis present

## 2017-05-06 DIAGNOSIS — Z79899 Other long term (current) drug therapy: Secondary | ICD-10-CM

## 2017-05-06 DIAGNOSIS — Z7982 Long term (current) use of aspirin: Secondary | ICD-10-CM

## 2017-05-06 DIAGNOSIS — J111 Influenza due to unidentified influenza virus with other respiratory manifestations: Secondary | ICD-10-CM | POA: Diagnosis present

## 2017-05-06 DIAGNOSIS — R0602 Shortness of breath: Secondary | ICD-10-CM | POA: Diagnosis not present

## 2017-05-06 DIAGNOSIS — Z8249 Family history of ischemic heart disease and other diseases of the circulatory system: Secondary | ICD-10-CM

## 2017-05-06 DIAGNOSIS — E43 Unspecified severe protein-calorie malnutrition: Secondary | ICD-10-CM | POA: Diagnosis present

## 2017-05-06 DIAGNOSIS — Z90710 Acquired absence of both cervix and uterus: Secondary | ICD-10-CM

## 2017-05-06 DIAGNOSIS — Z9049 Acquired absence of other specified parts of digestive tract: Secondary | ICD-10-CM

## 2017-05-06 DIAGNOSIS — Z950 Presence of cardiac pacemaker: Secondary | ICD-10-CM | POA: Diagnosis not present

## 2017-05-06 DIAGNOSIS — E860 Dehydration: Secondary | ICD-10-CM | POA: Diagnosis present

## 2017-05-06 DIAGNOSIS — E1151 Type 2 diabetes mellitus with diabetic peripheral angiopathy without gangrene: Secondary | ICD-10-CM | POA: Diagnosis present

## 2017-05-06 DIAGNOSIS — E1142 Type 2 diabetes mellitus with diabetic polyneuropathy: Secondary | ICD-10-CM | POA: Diagnosis present

## 2017-05-06 DIAGNOSIS — I2510 Atherosclerotic heart disease of native coronary artery without angina pectoris: Secondary | ICD-10-CM | POA: Diagnosis present

## 2017-05-06 DIAGNOSIS — J1081 Influenza due to other identified influenza virus with encephalopathy: Secondary | ICD-10-CM | POA: Diagnosis present

## 2017-05-06 DIAGNOSIS — E785 Hyperlipidemia, unspecified: Secondary | ICD-10-CM | POA: Diagnosis present

## 2017-05-06 DIAGNOSIS — E1136 Type 2 diabetes mellitus with diabetic cataract: Secondary | ICD-10-CM | POA: Diagnosis present

## 2017-05-06 DIAGNOSIS — R05 Cough: Secondary | ICD-10-CM | POA: Diagnosis not present

## 2017-05-06 DIAGNOSIS — Z9089 Acquired absence of other organs: Secondary | ICD-10-CM

## 2017-05-06 DIAGNOSIS — J101 Influenza due to other identified influenza virus with other respiratory manifestations: Secondary | ICD-10-CM | POA: Diagnosis not present

## 2017-05-06 DIAGNOSIS — Z888 Allergy status to other drugs, medicaments and biological substances status: Secondary | ICD-10-CM

## 2017-05-06 LAB — CBC WITH DIFFERENTIAL/PLATELET
Basophils Absolute: 0 10*3/uL (ref 0.0–0.1)
Basophils Relative: 0 %
Eosinophils Absolute: 0.1 10*3/uL (ref 0.0–0.7)
Eosinophils Relative: 1 %
HEMATOCRIT: 42.4 % (ref 36.0–46.0)
HEMOGLOBIN: 14.6 g/dL (ref 12.0–15.0)
LYMPHS ABS: 0.6 10*3/uL — AB (ref 0.7–4.0)
LYMPHS PCT: 8 %
MCH: 31.9 pg (ref 26.0–34.0)
MCHC: 34.4 g/dL (ref 30.0–36.0)
MCV: 92.6 fL (ref 78.0–100.0)
MONOS PCT: 2 %
Monocytes Absolute: 0.2 10*3/uL (ref 0.1–1.0)
NEUTROS PCT: 89 %
Neutro Abs: 6.6 10*3/uL (ref 1.7–7.7)
Platelets: 154 10*3/uL (ref 150–400)
RBC: 4.58 MIL/uL (ref 3.87–5.11)
RDW: 14 % (ref 11.5–15.5)
WBC: 7.5 10*3/uL (ref 4.0–10.5)

## 2017-05-06 LAB — URINALYSIS, ROUTINE W REFLEX MICROSCOPIC
BILIRUBIN URINE: NEGATIVE
Ketones, ur: NEGATIVE mg/dL
LEUKOCYTES UA: NEGATIVE
NITRITE: NEGATIVE
PH: 5 (ref 5.0–8.0)
Protein, ur: 100 mg/dL — AB
SPECIFIC GRAVITY, URINE: 1.01 (ref 1.005–1.030)

## 2017-05-06 LAB — COMPREHENSIVE METABOLIC PANEL
ALK PHOS: 102 U/L (ref 38–126)
ALT: 29 U/L (ref 14–54)
ANION GAP: 11 (ref 5–15)
AST: 28 U/L (ref 15–41)
Albumin: 3.2 g/dL — ABNORMAL LOW (ref 3.5–5.0)
BILIRUBIN TOTAL: 0.5 mg/dL (ref 0.3–1.2)
BUN: 43 mg/dL — ABNORMAL HIGH (ref 6–20)
CALCIUM: 7.5 mg/dL — AB (ref 8.9–10.3)
CO2: 19 mmol/L — AB (ref 22–32)
CREATININE: 2.21 mg/dL — AB (ref 0.44–1.00)
Chloride: 104 mmol/L (ref 101–111)
GFR calc Af Amer: 23 mL/min — ABNORMAL LOW (ref >60)
GFR, EST NON AFRICAN AMERICAN: 20 mL/min — AB (ref >60)
Glucose, Bld: 456 mg/dL — ABNORMAL HIGH (ref 65–99)
Potassium: 4.8 mmol/L (ref 3.5–5.1)
Sodium: 134 mmol/L — ABNORMAL LOW (ref 135–145)
TOTAL PROTEIN: 7.3 g/dL (ref 6.5–8.1)

## 2017-05-06 LAB — INFLUENZA PANEL BY PCR (TYPE A & B)
INFLAPCR: POSITIVE — AB
Influenza B By PCR: NEGATIVE

## 2017-05-06 LAB — I-STAT CG4 LACTIC ACID, ED: LACTIC ACID, VENOUS: 1.2 mmol/L (ref 0.5–1.9)

## 2017-05-06 LAB — TROPONIN I: Troponin I: 0.03 ng/mL (ref <0.03)

## 2017-05-06 MED ORDER — SODIUM CHLORIDE 0.9 % IV BOLUS (SEPSIS)
1000.0000 mL | Freq: Once | INTRAVENOUS | Status: AC
  Administered 2017-05-06: 1000 mL via INTRAVENOUS

## 2017-05-06 MED ORDER — INSULIN ASPART 100 UNIT/ML ~~LOC~~ SOLN
4.0000 [IU] | Freq: Once | SUBCUTANEOUS | Status: AC
  Administered 2017-05-06: 4 [IU] via INTRAVENOUS
  Filled 2017-05-06: qty 1

## 2017-05-06 MED ORDER — OSELTAMIVIR PHOSPHATE 30 MG PO CAPS
30.0000 mg | ORAL_CAPSULE | Freq: Once | ORAL | Status: AC
  Administered 2017-05-06: 30 mg via ORAL
  Filled 2017-05-06: qty 1

## 2017-05-06 MED ORDER — LABETALOL HCL 5 MG/ML IV SOLN
10.0000 mg | Freq: Once | INTRAVENOUS | Status: AC
  Administered 2017-05-06: 10 mg via INTRAVENOUS
  Filled 2017-05-06: qty 4

## 2017-05-06 NOTE — ED Notes (Signed)
Date and time results received: 05/06/17 2118 (use smartphrase ".now" to insert current time)  Test: Troponin Critical Value: 0.03  Name of Provider Notified: Dr. Ellender Hose  Orders Received? Or Actions Taken?: Orders Received - See Orders for details

## 2017-05-06 NOTE — ED Provider Notes (Signed)
[facility] DEPT Provider Note   CSN: [id number] Arrival date & time: 05/06/17  1818     History   Chief Complaint Chief Complaint  Patient presents with  . Flu symptoms    HPI Carmen [name] is a 84 y.o. female.  HPI    84 year old female with past medical history as below here with generalized weakness and confusion.  The patient reportedly has been increasingly weak and drowsy over the last 2 days.  She is had cough and sputum production.  She has a known sick contact and her husband who is currently hospice with the flu.  She has not been wanting to eat or drink and is not taken her medications today.  She said difficulty getting around the house.  She lives with her husband and a son.  The daughter decided to bring her in due to increasing weakness, fevers up to 102 at home, and fatigue.  Patient currently states she feels exhausted.  Denies any pain.  She feels mildly short of breath.  No chest pain.  No abdominal pain, nausea, vomiting, or diarrhea.  Past Medical History:  Diagnosis Date  . Anxiety   . Arthritis    "everywhere"  . Cataract    "both eyes; blood pressure's always too high to have them fixed" (09/11/2014)  . Chronic lower back pain   . Claudication in peripheral vascular disease ([place])    02/16/10: Left CIA 9.0x28 Omnilink and REIA 8.0x40 seff expanding Zilver.   right subclavian artery stent 03/18/2008,  . Coronary artery disease   . Herniated lumbar intervertebral disc   . Hypertension   . IDDM (insulin dependent diabetes mellitus) ([place])   . Migraine    "used to have terrible migraines; stopped in the 1990's"  . Pacemaker   . Peripheral vascular disease ([place])   . Pneumonia X 2  . Renal disorder   . Renovascular hypertension     s/p left renal artery stent 12/2007.  S/P balloon angioplasty on 02/16/10 for ISR, BP was  controlled well since then.     . Stroke [facility]) 1999   Stroke and TIA in 1999 with right sided weakness,  now with residual right arm weakness (09/11/2014)  . UTI (lower urinary tract infection) 02/2015    Patient Active Problem List   Diagnosis Date Noted  . Influenza 05/06/2017  . Hyperglycemia 07/25/2016  . Acute metabolic encephalopathy 73/42/8768  . Hypocalcemia 07/25/2016  . Elevated troponin   . Weakness   . Normal anion gap metabolic acidosis 11/57/2620  . Chest pain 01/13/2016  . Hypoglycemia 10/28/2015  . DNR (do not resuscitate) discussion 02/27/2015  . Palliative care encounter 02/27/2015  . Acute on chronic kidney failure ([place])   . Constipation   . Dehydration, moderate 02/26/2015  . Failure to thrive in adult 02/26/2015  . Acute on chronic renal failure ([place]) 02/26/2015  . Acute cystitis without hematuria   . AKI (acute kidney injury) ([place]) 01/14/2015  . Metabolic acidosis 35/59/7416  . Urinary retention 11/02/2014  . Lower limb ischemia; LLE 11/02/2014  . Hyperkalemia 11/02/2014  . Acute renal failure syndrome ([place])   . Hematuria   . Acute encephalopathy   . Altered mental status 10/28/2014  . Confusion 10/28/2014  . Pulmonary edema 09/11/2014  . Acute respiratory failure with hypoxia ([place]) 09/11/2014  . Acute diastolic heart failure ([place]) 09/11/2014  . Protein-calorie malnutrition, severe ([place]) 08/06/2013  . Hypertensive crisis 08/05/2013  . Hypertensive urgency 08/03/2013  . Weakness  generalized 04/06/2013  . Dehydration 04/06/2013  . Transaminitis 04/06/2013  . Type 2 diabetes mellitus with hyperosmolar nonketotic hyperglycemia ([place]) 04/05/2013  . CKD (chronic kidney disease), stage III ([place]) 04/05/2013  . HTN (hypertension) 04/05/2013  . Dyslipidemia 04/05/2013  . Abnormal LFTs 04/05/2013  . Cerebral infarction ([place]) 10/15/2011  . [employer] 10/11/2011  . History of CVA (cerebrovascular accident) with associated mild right upper extremity hemiplegia 07/30/2011  . Retrovascular hypertension status post left renal artery stent 2009 and  balloon angioplasty for in-stent restenosis 2011 04/05/2011  . Hypokalemia 03/28/2011  . Tobacco abuse 03/28/2011  . DM (diabetes mellitus), type 2, uncontrolled, periph vascular complic ([place]) 69/62/9528  . Peripheral neuropathy 03/28/2011    Past Surgical History:  Procedure Laterality Date  . ABDOMINAL ANGIOGRAM N/A 07/06/2011   Procedure: ABDOMINAL ANGIOGRAM;  Surgeon: Laverda Page, MD;  Location: [facility] CATH LAB;  Service: Cardiovascular;  Laterality: N/A;  . APPENDECTOMY    . BACK SURGERY    . CAROTID ENDARTERECTOMY Left 1991    Stroke and TIA in 1999 with right sided weakness, now with residual right arm weakness  . CARPAL TUNNEL RELEASE Left   . COLONOSCOPY  07/30/2011   Procedure: COLONOSCOPY;  Surgeon: Inda Castle, MD;  Location: [facility];  Service: Endoscopy;  Laterality: N/A;  gi bleed  . ESOPHAGOGASTRODUODENOSCOPY  07/30/2011   Procedure: ESOPHAGOGASTRODUODENOSCOPY (EGD);  Surgeon: Inda Castle, MD;  Location: [facility];  Service: Endoscopy;  Laterality: N/A;  . EXCISIONAL HEMORRHOIDECTOMY    . INSERT / REPLACE / REMOVE PACEMAKER    . IR FLUORO GUIDE CV LINE RIGHT  07/26/2016  . IR US GUIDE VASC ACCESS RIGHT  07/26/2016  . LOWER EXTREMITY ANGIOGRAM Right 05/29/2012   Procedure: LOWER EXTREMITY ANGIOGRAM;  Surgeon: Laverda Page, MD;  Location: [facility] CATH LAB;  Service: Cardiovascular;  Laterality: Right;  . LUMBAR LAMINECTOMY     'herniated disc"  . PERIPHERAL VASCULAR CATHETERIZATION N/A 11/05/2014   Procedure: Abdominal Aortogram;  Surgeon: Serafina Mitchell, MD;  Location: [facility] CV LAB;  Service: Cardiovascular;  Laterality: N/A;  . PERMANENT PACEMAKER INSERTION Left 06/28/2011   Procedure: PERMANENT PACEMAKER INSERTION;  Surgeon: Deboraha Sprang, MD;  Location: [facility] CATH LAB;  Service: Cardiovascular;  Laterality: Left;  . RENAL ANGIOGRAM N/A 07/06/2011   Procedure: RENAL ANGIOGRAM;  Surgeon: Laverda Page, MD;  Location: [facility] CATH LAB;  Service:  Cardiovascular;  Laterality: N/A;  . TONSILLECTOMY    . URETERAL STENT PLACEMENT    . VAGINAL HYSTERECTOMY      OB History    No data available       Home Medications    Prior to Admission medications   Medication Sig Start Date End Date Taking? Authorizing Provider  aspirin EC 81 MG tablet Take 81 mg by mouth daily.   Yes [provider]  clopidogrel (PLAVIX) 75 MG tablet Take 75 mg by mouth daily.   Yes [provider]  insulin NPH-regular Human (NOVOLIN 70/30) (70-30) 100 UNIT/ML injection Inject 5 Units into the skin 2 (two) times daily. 10/30/15  Yes Short, Noah Delaine, MD  isosorbide mononitrate (IMDUR) 120 MG 24 hr tablet Take 1 tablet (120 mg total) by mouth daily. Resume in 2 days. 07/29/16  Yes Eugenie Filler, MD  metoprolol tartrate (LOPRESSOR) 25 MG tablet Take 25 mg by mouth 2 (two) times daily.   Yes [provider]  Omega-3 Fatty Acids (FISH OIL) 1000 MG CAPS Take 1,000 mg by mouth  2 (two) times daily.    Yes [provider]  potassium chloride SA (K-DUR,KLOR-CON) 20 MEQ tablet Take 1 tablet (20 mEq total) by mouth daily. 07/29/16  Yes Eugenie Filler, MD  acetaminophen (TYLENOL) 325 MG tablet Take 2 tablets (650 mg total) by mouth every 4 (four) hours as needed for headache or mild pain. 01/15/16   Regalado, Belkys A, MD  calcium-vitamin D (OSCAL WITH D) 500-200 MG-UNIT tablet Take 2 tablets by mouth 4 (four) times daily. 07/27/16   Eugenie Filler, MD  diclofenac sodium (VOLTAREN) 1 % GEL Apply 2 g topically 4 (four) times daily.  04/28/17   [provider]  docusate sodium (COLACE) 100 MG capsule Take 1 capsule (100 mg total) by mouth daily. 04/16/17   Montine Circle, PA-C  furosemide (LASIX) 20 MG tablet Take 1 tablet (20 mg total) by mouth daily. Resume at lower dose in 2 days. 07/29/16   Eugenie Filler, MD  Garlic 235 MG CAPS Take 500 mg by mouth daily.     [provider]  [place] Calcium 500 MG TABS  Take 500 mg by mouth.    [provider]  aliskiren (TEKTURNA) 150 MG tablet Take 150 mg by mouth daily.  05/29/11  [provider]  gabapentin (NEURONTIN) 100 MG capsule Take 1 capsule (100 mg total) by mouth 3 (three) times daily. 04/06/11 05/29/11  Nita Sells, MD    Family History Family History  Problem Relation Age of Onset  . Cancer Father        Colon cancer  . Heart attack Mother     Social History Social History   Tobacco Use  . Smoking status: Former Smoker    Packs/day: 0.00    Years: 70.00    Pack years: 0.00    Types: Cigarettes  . Smokeless tobacco: Never Used  . Tobacco comment: 09/11/2014 "I used to smoke very heavy; down to 1 cigarette/day"  Substance Use Topics  . Alcohol use: No    Comment: "stopped drinking back in the 1970's"  . Drug use: No     Allergies   Norvasc [amlodipine besylate] and Peanut-containing drug products   Review of Systems Review of Systems  Constitutional: Positive for fatigue.  Musculoskeletal: Positive for arthralgias and myalgias.  Neurological: Positive for weakness.  All other systems reviewed and are negative.    Physical Exam Updated Vital Signs BP (!) 225/87   Pulse 92   Temp 99.6 F (37.6 C) (Oral)   Resp (!) 22   SpO2 100%   Physical Exam  Constitutional: She appears well-developed and well-nourished. No distress.  HENT:  Head: Normocephalic and atraumatic.  Dry MM  Eyes: Conjunctivae are normal.  Neck: Neck supple.  Cardiovascular: Normal rate, regular rhythm and normal heart sounds. Exam reveals no friction rub.  No murmur heard. Pulmonary/Chest: Effort normal and breath sounds normal. Tachypnea noted. No respiratory distress. She has no wheezes. She has no rales.  Abdominal: She exhibits no distension.  Musculoskeletal: She exhibits no edema.  Neurological: She is alert. She exhibits normal muscle tone.  Fatigued, falls asleep easily. Face symmetric. Speech soft but not  slurred. Strength 5/5 bl UE and LE. Normal sensation to light touch.  Skin: Skin is warm. Capillary refill takes less than 2 seconds.  Psychiatric: She has a normal mood and affect.  Nursing note and vitals reviewed.    ED Treatments / Results  Labs (all labs ordered are listed, but only abnormal results  are displayed) Labs Reviewed  CBC WITH DIFFERENTIAL/PLATELET - Abnormal; Notable for the following components:      Result Value   Lymphs Abs 0.6 (*)    All other components within normal limits  COMPREHENSIVE METABOLIC PANEL - Abnormal; Notable for the following components:   Sodium 134 (*)    CO2 19 (*)    Glucose, Bld 456 (*)    BUN 43 (*)    Creatinine, Ser 2.21 (*)    Calcium 7.5 (*)    Albumin 3.2 (*)    GFR calc non Af Amer 20 (*)    GFR calc Af Amer 23 (*)    All other components within normal limits  INFLUENZA PANEL BY PCR (TYPE A & B) - Abnormal; Notable for the following components:   Influenza A By PCR POSITIVE (*)    All other components within normal limits  URINALYSIS, ROUTINE W REFLEX MICROSCOPIC - Abnormal; Notable for the following components:   Color, Urine STRAW (*)    Glucose, UA >=500 (*)    Hgb urine dipstick SMALL (*)    Protein, ur 100 (*)    Bacteria, UA RARE (*)    Squamous Epithelial / LPF 0-5 (*)    All other components within normal limits  TROPONIN I - Abnormal; Notable for the following components:   Troponin I 0.03 (*)    All other components within normal limits  I-STAT CG4 LACTIC ACID, ED    EKG  EKG Interpretation  Date/Time:  Friday May 06 2017 20:16:01 EST Ventricular Rate:  88 PR Interval:    QRS Duration: 87 QT Interval:  389 QTC Calculation: 471 R Axis:   31 Text Interpretation:  Sinus rhythm LVH with secondary repolarization abnormality Anterior Q waves, possibly due to LVH Since last EKG, rate is increased Otherwise no significant change Confirmed by Duffy Bruce [phone]) on 05/06/2017 9:09:24 PM        Radiology Dg Chest 2 View  Result Date: 05/06/2017 CLINICAL DATA:  Shortness of breath, congestion and fever for 2 weeks. EXAM: CHEST  2 VIEW COMPARISON:  PA and lateral chest 07/25/2016, 03/27/2016 and 01/13/2016. FINDINGS: The lungs are clear. Heart size is normal. There is no pneumothorax or pleural fluid. Aortic atherosclerosis is noted. Heart size is normal. Pacing device in place. No acute bony abnormality. IMPRESSION: No acute disease. Electronically Signed   By: Inge Rise M.D.   On: 05/06/2017 19:50    Procedures Procedures (including critical care time)  Emergency Ultrasound Study:   Angiocath insertion Performed by: Evonnie Pat Consent: Verbal consent/emergent consent obtained. Risks and benefits: risks, benefits and alternatives were discussed Immediately prior to procedure the correct patient, procedure, equipment, support staff and site/side marked as needed.  Indication: difficult IV access Preparation: Patient was prepped and draped in the usual sterile fashion. Sterile gel was used for this procedure and the ultrasound probe was sterilized prior to use. Vein Location: Left forearm vein was visualized during assessment for potential access sites and was found to be patent/ easily compressed with linear ultrasound.  The needle was visualized with real-time ultrasound and guided into the vein. Gauge: 20  Image saved and stored.  Normal blood return.   Patient tolerance: Patient tolerated the procedure well with no immediate complications.       Medications Ordered in ED Medications  sodium chloride 0.9 % bolus 1,000 mL (0 mLs Intravenous Stopped 05/06/17 2022)  insulin aspart (novoLOG) injection 4 Units (4 Units Intravenous Given 05/06/17 2312)  labetalol (NORMODYNE,TRANDATE) injection 10 mg (10 mg Intravenous Given 05/06/17 2324)  oseltamivir (TAMIFLU) capsule 30 mg (30 mg Oral Given 05/06/17 2247)     Initial Impression / Assessment and Plan / ED Course   I have reviewed the triage vital signs and the nursing notes.  Pertinent labs & imaging results that were available during my care of the patient were reviewed by me and considered in my medical decision making (see chart for details).     84 year old female here with generalized weakness and poor appetite.  This occurs in the setting of known contacts with positive influenza patient.  Patient appears dehydrated, fatigued, and reportedly has not been wanting to eat and drink and is not taking her medications.  Lab work shows that she is markedly hyperglycemic, moderately dehydrated, but otherwise without signs of severe sepsis.  She is influenza positive.  She appears significantly weak.  Will give IV labetalol for hypertension, IV insulin, fluids, and plan for admission for generalized weakness and failure to thrive in the setting of influenza.  IV placed by myself.  Final Clinical Impressions(s) / ED Diagnoses   Final diagnoses:  Influenza A    ED Discharge Orders    None       Duffy Bruce, MD 05/07/17 0009

## 2017-05-06 NOTE — H&P (Signed)
[facility]                                                                                    Patient Demographics  Carmen [name], is a 84 y.o. female  CSN: [id number]  MRN: [id number]  DOB - 12/29/1933  Admit Date - 05/06/2017  Outpatient Primary MD for the patient is Glendale Chard, MD   With History of -  Past Medical History:  Diagnosis Date  . Anxiety   . Arthritis    "everywhere"  . Cataract    "both eyes; blood pressure's always too high to have them fixed" (09/11/2014)  . Chronic lower back pain   . Claudication in peripheral vascular disease ([place])    02/16/10: Left CIA 9.0x28 Omnilink and REIA 8.0x40 seff expanding Zilver.   right subclavian artery stent 03/18/2008,  . Coronary artery disease   . Herniated lumbar intervertebral disc   . Hypertension   . IDDM (insulin dependent diabetes mellitus) ([place])   . Migraine    "used to have terrible migraines; stopped in the 1990's"  . Pacemaker   . Peripheral vascular disease ([place])   . Pneumonia X 2  . Renal disorder   . Renovascular hypertension     s/p left renal artery stent 12/2007.  S/P balloon angioplasty on 02/16/10 for ISR, BP was  controlled well since then.     . Stroke [facility]) 1999   Stroke and TIA in 1999 with right sided weakness, now with residual right arm weakness (09/11/2014)  . UTI (lower urinary tract infection) 02/2015      Past Surgical History:  Procedure Laterality Date  . ABDOMINAL ANGIOGRAM N/A 07/06/2011   Procedure: ABDOMINAL ANGIOGRAM;  Surgeon: Laverda Page, MD;  Location: [facility] CATH LAB;  Service: Cardiovascular;  Laterality: N/A;  . APPENDECTOMY    . BACK SURGERY    . CAROTID ENDARTERECTOMY Left 1991    Stroke and TIA in 1999 with right sided weakness, now with residual right arm weakness  . CARPAL TUNNEL RELEASE Left   . COLONOSCOPY  07/30/2011   Procedure: COLONOSCOPY;  Surgeon: Inda Castle, MD;  Location: [place];  Service: Endoscopy;  Laterality: N/A;  gi bleed   . ESOPHAGOGASTRODUODENOSCOPY  07/30/2011   Procedure: ESOPHAGOGASTRODUODENOSCOPY (EGD);  Surgeon: Inda Castle, MD;  Location: [place];  Service: Endoscopy;  Laterality: N/A;  . EXCISIONAL HEMORRHOIDECTOMY    . INSERT / REPLACE / REMOVE PACEMAKER    . IR FLUORO GUIDE CV LINE RIGHT  07/26/2016  . IR US GUIDE VASC ACCESS RIGHT  07/26/2016  . LOWER EXTREMITY ANGIOGRAM Right 05/29/2012   Procedure: LOWER EXTREMITY ANGIOGRAM;  Surgeon: Laverda Page, MD;  Location[facility] CATH LAB;  Service: Cardiovascular;  Laterality: Right;  . LUMBAR LAMINECTOMY     'herniated disc"  . PERIPHERAL VASCULAR CATHETERIZATION N/A 11/05/2014   Procedure: Abdominal Aortogram;  Surgeon: Serafina Mitchell, MD;  Location: [place] CV LAB;  Service: Cardiovascular;  Laterality: N/A;  . PERMANENT PACEMAKER INSERTION Left 06/28/2011   Procedure: PERMANENT PACEMAKER INSERTION;  Surgeon: Deboraha Sprang, MD;  Location: [facility] CATH LAB;  Service: Cardiovascular;  Laterality: Left;  .  RENAL ANGIOGRAM N/A 07/06/2011   Procedure: RENAL ANGIOGRAM;  Surgeon: Laverda Page, MD;  Location: [facility] CATH LAB;  Service: Cardiovascular;  Laterality: N/A;  . TONSILLECTOMY    . URETERAL STENT PLACEMENT    . VAGINAL HYSTERECTOMY      in for   Chief Complaint  Patient presents with  . Flu symptoms     HPI  Carmen [name]  is a 84 y.o. female, with past medical history significant for hypertension , diabetes mellitus and chronic renal failure presenting with worsening mental status and weakness for the last few days.  She has been having cough with sputum production and decreased appetite.  Her husband was admitted with the flu earlier.  We are not sure about the onset date of the symptoms, but it might have been last week.  In the emergency room the patient was noted to be febrile, dehydrated with increased blood sugar and her blood pressure was above 474 systolic.  Patient did not have increased anion gap .    Review of Systems     In addition to the HPI above,   No Headache, No changes with Vision or hearing, No problems swallowing food or Liquids, No Chest pain,  No Abdominal pain, No Nausea or Vommitting, Bowel movements are regular, No Blood in stool or Urine, No dysuria, No new skin rashes or bruises, No new joints pains-aches,  No recent weight gain or loss,   A full 10 point Review of Systems was done, except as stated above, all other Review of Systems were negative.   Social History Social History   Tobacco Use  . Smoking status: Former Smoker    Packs/day: 0.00    Years: 70.00    Pack years: 0.00    Types: Cigarettes  . Smokeless tobacco: Never Used  . Tobacco comment: 09/11/2014 "I used to smoke very heavy; down to 1 cigarette/day"  Substance Use Topics  . Alcohol use: No    Comment: "stopped drinking back in the 1970's"     Family History Family History  Problem Relation Age of Onset  . Cancer Father        Colon cancer  . Heart attack Mother      Prior to Admission medications   Medication Sig Start Date End Date Taking? Authorizing Provider  aspirin EC 81 MG tablet Take 81 mg by mouth daily.   Yes [provider]  clopidogrel (PLAVIX) 75 MG tablet Take 75 mg by mouth daily.   Yes [provider]  insulin NPH-regular Human (NOVOLIN 70/30) (70-30) 100 UNIT/ML injection Inject 5 Units into the skin 2 (two) times daily. 10/30/15  Yes Short, Noah Delaine, MD  isosorbide mononitrate (IMDUR) 120 MG 24 hr tablet Take 1 tablet (120 mg total) by mouth daily. Resume in 2 days. 07/29/16  Yes Eugenie Filler, MD  metoprolol tartrate (LOPRESSOR) 25 MG tablet Take 25 mg by mouth 2 (two) times daily.   Yes [provider]  Omega-3 Fatty Acids (FISH OIL) 1000 MG CAPS Take 1,000 mg by mouth 2 (two) times daily.    Yes [provider]  potassium chloride SA (K-DUR,KLOR-CON) 20 MEQ tablet Take 1 tablet (20 mEq total) by mouth daily. 07/29/16  Yes Eugenie Filler,  MD  acetaminophen (TYLENOL) 325 MG tablet Take 2 tablets (650 mg total) by mouth every 4 (four) hours as needed for headache or mild pain. 01/15/16   Regalado, Cassie Freer, MD  calcium-vitamin D (OSCAL WITH D) 500-200  MG-UNIT tablet Take 2 tablets by mouth 4 (four) times daily. 07/27/16   Eugenie Filler, MD  diclofenac sodium (VOLTAREN) 1 % GEL Apply 2 g topically 4 (four) times daily.  04/28/17   [provider]  docusate sodium (COLACE) 100 MG capsule Take 1 capsule (100 mg total) by mouth daily. 04/16/17   Montine Circle, PA-C  furosemide (LASIX) 20 MG tablet Take 1 tablet (20 mg total) by mouth daily. Resume at lower dose in 2 days. 07/29/16   Eugenie Filler, MD  Garlic 492 MG CAPS Take 500 mg by mouth daily.     [provider]  [place] Calcium 500 MG TABS Take 500 mg by mouth.    [provider]  aliskiren (TEKTURNA) 150 MG tablet Take 150 mg by mouth daily.  05/29/11  [provider]  gabapentin (NEURONTIN) 100 MG capsule Take 1 capsule (100 mg total) by mouth 3 (three) times daily. 04/06/11 05/29/11  Nita Sells, MD    Allergies  Allergen Reactions  . Norvasc [Amlodipine Besylate] Swelling  . Peanut-Containing Drug Products Other (See Comments)    Stomach pain    Physical Exam  Vitals  Blood pressure (!) 251/98, pulse 90, temperature 99.6 F (37.6 C), temperature source Oral, resp. rate 19, SpO2 95 %.   1. General elderly female, looks dehydrated and tired  2. Normal affect and insight, Not Suicidal or Homicidal, pleasantly confused.  3. No F.N deficits, patient moving all extremities.  4. Ears and Eyes appear Normal, Conjunctivae clear, PERRLA.  Dry oral Mucosa.  5. Supple Neck, No JVD, No cervical lymphadenopathy appriciated, No Carotid Bruits.  6. Symmetrical Chest wall movement, CTAB.  7. RRR, No Gallops, Rubs or Murmurs, No Parasternal Heave.  8. Positive Bowel Sounds, Abdomen Soft, Non tender, No organomegaly  appriciated,No rebound -guarding or rigidity.  9.  No Cyanosis, Normal Skin Turgor, No Skin Rash or Bruise.  10. Good muscle tone,  joints appear normal , no effusions, Normal ROM.   Data Review  CBC Recent Labs  Lab 05/06/17 1910  WBC 7.5  HGB 14.6  HCT 42.4  PLT 154  MCV 92.6  MCH 31.9  MCHC 34.4  RDW 14.0  LYMPHSABS 0.6*  MONOABS 0.2  EOSABS 0.1  BASOSABS 0.0   ------------------------------------------------------------------------------------------------------------------  Chemistries  Recent Labs  Lab 05/06/17 1910  NA 134*  K 4.8  CL 104  CO2 19*  GLUCOSE 456*  BUN 43*  CREATININE 2.21*  CALCIUM 7.5*  AST 28  ALT 29  ALKPHOS 102  BILITOT 0.5   ------------------------------------------------------------------------------------------------------------------ CrCl cannot be calculated (Unknown ideal weight.). ------------------------------------------------------------------------------------------------------------------ No results for input(s): TSH, T4TOTAL, T3FREE, THYROIDAB in the last 72 hours.  Invalid input(s): FREET3   Coagulation profile No results for input(s): INR, PROTIME in the last 168 hours. ------------------------------------------------------------------------------------------------------------------- No results for input(s): DDIMER in the last 72 hours. -------------------------------------------------------------------------------------------------------------------  Cardiac Enzymes Recent Labs  Lab 05/06/17 1910  TROPONINI 0.03*   ------------------------------------------------------------------------------------------------------------------ Invalid input(s): POCBNP   ---------------------------------------------------------------------------------------------------------------  Urinalysis    Component Value Date/Time   COLORURINE STRAW (A) 05/06/2017 2122   APPEARANCEUR CLEAR 05/06/2017 2122   LABSPEC 1.010  05/06/2017 2122   PHURINE 5.0 05/06/2017 2122   GLUCOSEU >=500 (A) 05/06/2017 2122   HGBUR SMALL (A) 05/06/2017 2122   BILIRUBINUR NEGATIVE 05/06/2017 2122   [place] NEGATIVE 05/06/2017 2122   PROTEINUR 100 (A) 05/06/2017 2122   UROBILINOGEN 0.2 01/13/2015 2034   NITRITE NEGATIVE 05/06/2017 2122   LEUKOCYTESUR NEGATIVE 05/06/2017 2122    ----------------------------------------------------------------------------------------------------------------  Imaging results:   Dg Chest 2 View  Result Date: 05/06/2017 CLINICAL DATA:  Shortness of breath, congestion and fever for 2 weeks. EXAM: CHEST  2 VIEW COMPARISON:  PA and lateral chest 07/25/2016, 03/27/2016 and 01/13/2016. FINDINGS: The lungs are clear. Heart size is normal. There is no pneumothorax or pleural fluid. Aortic atherosclerosis is noted. Heart size is normal. Pacing device in place. No acute bony abnormality. IMPRESSION: No acute disease. Electronically Signed   By: Inge Rise M.D.   On: 05/06/2017 19:50    My personal review of EKG: Rhythm NSR, Rate 88 bpm, LVH    Assessment & Plan  1.  Influenza, A : Do not know exact onset.      Tamiflu 2.  Dehydration with chronic renal failure      IV fluids normal saline 3.  Uncontrolled blood pressure      Restart p.o. Medications      Lopressor IV as needed 4.  Hyperglycemia      No anion gap      Normal saline      Insulin sliding scale   DVT Prophylaxis heparin  AM Labs Ordered, also please review Full Orders  Family Communication: Admission, patients condition and plan of care including tests being ordered have been discussed with the patient and daughter who indicate understanding and agree with the plan and Code Status.  Code Status DNR  Disposition Plan: Home  Time spent in minutes : 44 minutes  Condition GUARDED   @SIGNATURE @

## 2017-05-06 NOTE — ED Triage Notes (Signed)
Per EMS, pt is coming from home after daughter called and stated that pt was congested with a fever. EMS reports that pt is drowsy. Per EMS pt is alert and oriented. Pt has a hx of CHF, hypertension, and diabetes. Pt was unable to take medications today.

## 2017-05-06 NOTE — ED Notes (Signed)
Bed: XL24 Expected date:  Expected time:  Means of arrival:  Comments: 84 yo flu-like s/sx

## 2017-05-07 ENCOUNTER — Encounter (HOSPITAL_COMMUNITY): Payer: Self-pay | Admitting: *Deleted

## 2017-05-07 ENCOUNTER — Other Ambulatory Visit: Payer: Self-pay

## 2017-05-07 DIAGNOSIS — J101 Influenza due to other identified influenza virus with other respiratory manifestations: Principal | ICD-10-CM

## 2017-05-07 LAB — BASIC METABOLIC PANEL
Anion gap: 10 (ref 5–15)
BUN: 38 mg/dL — AB (ref 6–20)
CALCIUM: 7 mg/dL — AB (ref 8.9–10.3)
CHLORIDE: 110 mmol/L (ref 101–111)
CO2: 17 mmol/L — AB (ref 22–32)
CREATININE: 2.04 mg/dL — AB (ref 0.44–1.00)
GFR calc non Af Amer: 22 mL/min — ABNORMAL LOW (ref >60)
GFR, EST AFRICAN AMERICAN: 25 mL/min — AB (ref >60)
Glucose, Bld: 243 mg/dL — ABNORMAL HIGH (ref 65–99)
Potassium: 3.6 mmol/L (ref 3.5–5.1)
Sodium: 137 mmol/L (ref 135–145)

## 2017-05-07 LAB — GLUCOSE, CAPILLARY
GLUCOSE-CAPILLARY: 135 mg/dL — AB (ref 65–99)
GLUCOSE-CAPILLARY: 117 mg/dL — AB (ref 65–99)
Glucose-Capillary: 351 mg/dL — ABNORMAL HIGH (ref 65–99)
Glucose-Capillary: 213 mg/dL — ABNORMAL HIGH (ref 65–99)
Glucose-Capillary: 247 mg/dL — ABNORMAL HIGH (ref 65–99)

## 2017-05-07 MED ORDER — INSULIN ASPART 100 UNIT/ML ~~LOC~~ SOLN
0.0000 [IU] | Freq: Every day | SUBCUTANEOUS | Status: DC
  Administered 2017-05-07: 5 [IU] via SUBCUTANEOUS

## 2017-05-07 MED ORDER — ACETAMINOPHEN 650 MG RE SUPP: 650.0000 mg | Freq: Four times a day (QID) | RECTAL | Status: DC | PRN

## 2017-05-07 MED ORDER — INSULIN ASPART PROT & ASPART (70-30 MIX) 100 UNIT/ML ~~LOC~~ SUSP
8.0000 [IU] | Freq: Two times a day (BID) | SUBCUTANEOUS | Status: DC
  Administered 2017-05-07 – 2017-05-08 (×2): 8 [IU] via SUBCUTANEOUS
  Filled 2017-05-07: qty 10

## 2017-05-07 MED ORDER — HYDRALAZINE HCL 20 MG/ML IJ SOLN
10.0000 mg | Freq: Four times a day (QID) | INTRAMUSCULAR | Status: DC | PRN
  Administered 2017-05-07 – 2017-05-10 (×2): 10 mg via INTRAVENOUS
  Filled 2017-05-07 (×2): qty 1

## 2017-05-07 MED ORDER — ISOSORBIDE MONONITRATE ER 60 MG PO TB24
120.0000 mg | ORAL_TABLET | Freq: Every day | ORAL | Status: DC
  Administered 2017-05-07 – 2017-05-10 (×4): 120 mg via ORAL
  Filled 2017-05-07 (×4): qty 2

## 2017-05-07 MED ORDER — FUROSEMIDE 20 MG PO TABS
20.0000 mg | ORAL_TABLET | Freq: Every day | ORAL | Status: DC
  Administered 2017-05-07: 20 mg via ORAL
  Filled 2017-05-07: qty 1

## 2017-05-07 MED ORDER — INSULIN ASPART 100 UNIT/ML ~~LOC~~ SOLN
0.0000 [IU] | Freq: Three times a day (TID) | SUBCUTANEOUS | Status: DC
  Administered 2017-05-07 (×2): 3 [IU] via SUBCUTANEOUS
  Administered 2017-05-07: 1 [IU] via SUBCUTANEOUS
  Administered 2017-05-08: 2 [IU] via SUBCUTANEOUS
  Administered 2017-05-09: 5 [IU] via SUBCUTANEOUS
  Administered 2017-05-09 – 2017-05-10 (×2): 1 [IU] via SUBCUTANEOUS

## 2017-05-07 MED ORDER — ACETAMINOPHEN 325 MG PO TABS: 650.0000 mg | ORAL_TABLET | ORAL | Status: DC | PRN

## 2017-05-07 MED ORDER — ACETAMINOPHEN 325 MG PO TABS: 650.0000 mg | ORAL_TABLET | Freq: Four times a day (QID) | ORAL | Status: DC | PRN

## 2017-05-07 MED ORDER — LABETALOL HCL 5 MG/ML IV SOLN
10.0000 mg | Freq: Once | INTRAVENOUS | Status: AC
  Administered 2017-05-07: 10 mg via INTRAVENOUS
  Filled 2017-05-07: qty 4

## 2017-05-07 MED ORDER — SODIUM CHLORIDE 0.9 % IV SOLN
INTRAVENOUS | Status: DC
  Administered 2017-05-07 – 2017-05-08 (×4): via INTRAVENOUS

## 2017-05-07 MED ORDER — DOCUSATE SODIUM 100 MG PO CAPS
100.0000 mg | ORAL_CAPSULE | Freq: Every day | ORAL | Status: DC
  Administered 2017-05-07 – 2017-05-10 (×4): 100 mg via ORAL
  Filled 2017-05-07 (×4): qty 1

## 2017-05-07 MED ORDER — OSELTAMIVIR PHOSPHATE 30 MG PO CAPS
30.0000 mg | ORAL_CAPSULE | Freq: Every day | ORAL | Status: DC
  Administered 2017-05-07 – 2017-05-10 (×4): 30 mg via ORAL
  Filled 2017-05-07 (×4): qty 1

## 2017-05-07 MED ORDER — ONDANSETRON HCL 4 MG/2ML IJ SOLN: 4.0000 mg | Freq: Four times a day (QID) | INTRAMUSCULAR | Status: DC | PRN

## 2017-05-07 MED ORDER — HYDROCODONE-ACETAMINOPHEN 5-325 MG PO TABS
1.0000 | ORAL_TABLET | Freq: Four times a day (QID) | ORAL | Status: DC | PRN
  Administered 2017-05-10: 1 via ORAL
  Filled 2017-05-07: qty 1

## 2017-05-07 MED ORDER — ONDANSETRON HCL 4 MG PO TABS: 4.0000 mg | ORAL_TABLET | Freq: Four times a day (QID) | ORAL | Status: DC | PRN

## 2017-05-07 MED ORDER — ASPIRIN EC 81 MG PO TBEC
81.0000 mg | DELAYED_RELEASE_TABLET | Freq: Every day | ORAL | Status: DC
  Administered 2017-05-07 – 2017-05-10 (×4): 81 mg via ORAL
  Filled 2017-05-07 (×4): qty 1

## 2017-05-07 MED ORDER — METOPROLOL TARTRATE 25 MG PO TABS
25.0000 mg | ORAL_TABLET | Freq: Two times a day (BID) | ORAL | Status: DC
  Administered 2017-05-07 – 2017-05-10 (×8): 25 mg via ORAL
  Filled 2017-05-07 (×8): qty 1

## 2017-05-07 MED ORDER — INSULIN ASPART PROT & ASPART (70-30 MIX) 100 UNIT/ML ~~LOC~~ SUSP
5.0000 [IU] | Freq: Two times a day (BID) | SUBCUTANEOUS | Status: DC
  Administered 2017-05-07: 5 [IU] via SUBCUTANEOUS
  Filled 2017-05-07: qty 10

## 2017-05-07 MED ORDER — METOPROLOL TARTRATE 5 MG/5ML IV SOLN
2.5000 mg | INTRAVENOUS | Status: DC | PRN
  Administered 2017-05-07: 2.5 mg via INTRAVENOUS
  Filled 2017-05-07: qty 5

## 2017-05-07 MED ORDER — HEPARIN SODIUM (PORCINE) 5000 UNIT/ML IJ SOLN
5000.0000 [IU] | Freq: Three times a day (TID) | INTRAMUSCULAR | Status: DC
  Administered 2017-05-07 – 2017-05-10 (×11): 5000 [IU] via SUBCUTANEOUS
  Filled 2017-05-07 (×10): qty 1

## 2017-05-07 MED ORDER — CLOPIDOGREL BISULFATE 75 MG PO TABS
75.0000 mg | ORAL_TABLET | Freq: Every day | ORAL | Status: DC
  Administered 2017-05-07 – 2017-05-10 (×4): 75 mg via ORAL
  Filled 2017-05-07 (×4): qty 1

## 2017-05-07 NOTE — Progress Notes (Signed)
PROGRESS NOTE    Carmen [name]  NFA:[id number] DOB: 1933-12-21 DOA: 05/06/2017 PCP: Glendale Chard, MD    Brief Narrative:  Carmen [name]  is a 84 y.o. female, with past medical history significant for hypertension , diabetes mellitus and chronic renal failure presenting with worsening mental status and weakness for the last few days   Assessment & Plan:   Active Problems:   Influenza   Dehydration:  From flu, improving with IV fluids.    Type 2 DM: CBG (last 3)  Recent Labs    05/07/17 0133 05/07/17 0738 05/07/17 1216  GLUCAP 351* 213* 247*   Increase the novolog 70/30 to 8 units TIDAC.  CONTINUE with SSI.    HYPERTENSION: Well controlled.    Acute on Stage 4 CKD: Improved with hydration. Creatinine at baseline appears to be around 1.5.    Influenza A: On tamiflu.    Hyponatremia:  Improved with hydration.   Generalized weakness:  Sec to influenza. PT evaluation.    Acute encephalopathy: sec to dehydration and infection. She currently appears to be at baseline.   Hypocalcemia: Corrected calcium greater than 7.5.   DVT prophylaxis: (SQ Heparin.  Code Status: (DNR.  Family Communication: none at bedside.  Disposition Plan: pending PT evaluation.   Consultants:   None.    Procedures: (none.    Antimicrobials: tamiflu.    Subjective: Denies any new complaints.   Objective: Vitals:   05/07/17 0523 05/07/17 0639 05/07/17 0932 05/07/17 1425  BP: (!) 194/71 (!) 174/64 (!) 155/66 (!) 113/47  Pulse: 81  89 67  Resp: 16     Temp: 99.8 F (37.7 C)   97.7 F (36.5 C)  TempSrc: Oral   Oral  SpO2: 96%   93%    Intake/Output Summary (Last 24 hours) at 05/07/2017 1546 Last data filed at 05/07/2017 1513 Gross per 24 hour  Intake 1790 ml  Output -  Net 1790 ml   There were no vitals filed for this visit.  Examination:  General exam: frail elderly lady notin distress.  Respiratory system: Clear to auscultation. Respiratory effort  normal. Cardiovascular system: S1 & S2 heard, RRR. No JVD, . No pedal edema. Gastrointestinal system: Abdomen is nondistended, soft and nontender. No organomegaly or masses felt. Normal bowel sounds heard. Central nervous system: Alert and oriented to place and person.  Extremities: Symmetric 5 x 5 power. Skin: No rashes, lesions or ulcers Psychiatry:Mood & affect appropriate.     Data Reviewed: I have personally reviewed following labs and imaging studies  CBC: Recent Labs  Lab 05/06/17 1910  WBC 7.5  NEUTROABS 6.6  HGB 14.6  HCT 42.4  MCV 92.6  PLT 469   Basic Metabolic Panel: Recent Labs  Lab 05/06/17 1910 05/07/17 0541  NA 134* 137  K 4.8 3.6  CL 104 110  CO2 19* 17*  GLUCOSE 456* 243*  BUN 43* 38*  CREATININE 2.21* 2.04*  CALCIUM 7.5* 7.0*   GFR: CrCl cannot be calculated (Unknown ideal weight.). Liver Function Tests: Recent Labs  Lab 05/06/17 1910  AST 28  ALT 29  ALKPHOS 102  BILITOT 0.5  PROT 7.3  ALBUMIN 3.2*   No results for input(s): LIPASE, AMYLASE in the last 168 hours. No results for input(s): AMMONIA in the last 168 hours. Coagulation Profile: No results for input(s): INR, PROTIME in the last 168 hours. Cardiac Enzymes: Recent Labs  Lab 05/06/17 1910  TROPONINI 0.03*   BNP (last 3 results) No results for input(s):  PROBNP in the last 8760 hours. HbA1C: No results for input(s): HGBA1C in the last 72 hours. CBG: Recent Labs  Lab 05/07/17 0133 05/07/17 0738 05/07/17 1216  GLUCAP 351* 213* 247*   Lipid Profile: No results for input(s): CHOL, HDL, LDLCALC, TRIG, CHOLHDL, LDLDIRECT in the last 72 hours. Thyroid Function Tests: No results for input(s): TSH, T4TOTAL, FREET4, T3FREE, THYROIDAB in the last 72 hours. Anemia Panel: No results for input(s): VITAMINB12, FOLATE, FERRITIN, TIBC, IRON, RETICCTPCT in the last 72 hours. Sepsis Labs: Recent Labs  Lab 05/06/17 1918  LATICACIDVEN 1.20    No results found for this or any  previous visit (from the past 240 hour(s)).       Radiology Studies: Dg Chest 2 View  Result Date: 05/06/2017 CLINICAL DATA:  Shortness of breath, congestion and fever for 2 weeks. EXAM: CHEST  2 VIEW COMPARISON:  PA and lateral chest 07/25/2016, 03/27/2016 and 01/13/2016. FINDINGS: The lungs are clear. Heart size is normal. There is no pneumothorax or pleural fluid. Aortic atherosclerosis is noted. Heart size is normal. Pacing device in place. No acute bony abnormality. IMPRESSION: No acute disease. Electronically Signed   By: Inge Rise M.D.   On: 05/06/2017 19:50        Scheduled Meds: . aspirin EC  81 mg Oral Daily  . clopidogrel  75 mg Oral Daily  . docusate sodium  100 mg Oral Daily  . furosemide  20 mg Oral Daily  . heparin  5,000 Units Subcutaneous Q8H  . insulin aspart  0-5 Units Subcutaneous QHS  . insulin aspart  0-9 Units Subcutaneous TID WC  . insulin aspart protamine- aspart  5 Units Subcutaneous BID WC  . isosorbide mononitrate  120 mg Oral Daily  . metoprolol tartrate  25 mg Oral BID  . oseltamivir  30 mg Oral Daily   Continuous Infusions: . sodium chloride 75 mL/hr at 05/07/17 1507     LOS: 1 day    Time spent: 35 minutes.     Hosie Poisson, MD [facility] Pager [phone]  If 7PM-7AM, please contact night-coverage [email or web address] Password [facility] 05/07/2017, 3:46 PM

## 2017-05-07 NOTE — ED Notes (Signed)
ED TO INPATIENT HANDOFF REPORT  Name/Age/Gender Carmen [name] 84 y.o. female  Code Status Code Status History    Date Active Date Inactive Code Status Order ID Comments User Context   07/26/2016 09:58 07/27/2016 18:50 DNR [id number]  Eugenie Filler, MD Inpatient   07/25/2016 20:22 07/26/2016 09:58 Full Code [id number]  Eugenie Filler, MD Inpatient   07/25/2016 16:27 07/25/2016 20:21 DNR [id number]  Eugenie Filler, MD ED   01/13/2016 20:47 01/15/2016 17:05 DNR [id number]  Reubin Milan, MD Inpatient   01/13/2016 20:47 01/13/2016 20:47 DNR [id number]  Reubin Milan, MD Inpatient   10/29/2015 01:31 10/30/2015 17:03 DNR [id number]  [name], MD ED   02/26/2015 21:02 03/01/2015 13:44 DNR [id number]  Schorr, Rhetta Mura, NP Inpatient   02/26/2015 15:17 02/26/2015 21:02 DNR [id number]  Melton Alar, PA-C Inpatient   01/14/2015 05:49 01/16/2015 14:10 Full Code [id number]  Norval Morton, MD Inpatient   11/05/2014 18:03 11/07/2014 19:57 Full Code [id number]  Serafina Mitchell, MD Inpatient   10/28/2014 16:30 11/05/2014 18:03 Full Code [id number]  Florencia Reasons, MD Inpatient   10/28/2014 15:51 10/28/2014 16:30 Full Code [id number]  Florencia Reasons, MD ED   09/11/2014 20:46 09/16/2014 17:47 Full Code [id number]  Charlynne Cousins, MD Inpatient   08/03/2013 20:37 08/09/2013 16:29 Full Code [id number]  Etta Quill, DO ED   06/02/2013 20:50 06/03/2013 21:14 Full Code [id number]  Shanda Howells, MD ED   04/06/2013 07:32 04/08/2013 19:19 Full Code [id number]  Robbie Lis, MD Inpatient   04/05/2013 17:03 04/06/2013 07:32 Full Code [id number]  Robbie Lis, MD ED   11/30/2012 22:28 12/12/2012 19:34 Full Code [id number]  Domenic Polite, MD Inpatient   08/12/2012 17:31 08/16/2012 20:11 Full Code [id number]  Elvera Lennox, MD Inpatient   04/30/2012 18:27 05/02/2012 16:17 DNR [id number]  Samuella Cota, MD ED   03/07/2012 10:13 03/08/2012 18:47 Full Code [id number]  Samuella Cota, MD Inpatient   11/11/2011 06:18 11/13/2011  16:55 Full Code [id number]  Hardie Pulley, RN Inpatient   10/15/2011 20:31 10/18/2011 19:21 Full Code [id number]  Sid Falcon, RN Inpatient   07/29/2011 13:10 07/31/2011 12:57 Full Code [id number]  Shona Simpson, RN Inpatient    Questions for Most Recent Historical Code Status (Order [id number])    Question Answer Comment   In the event of cardiac or respiratory ARREST Do not call a "code blue"    In the event of cardiac or respiratory ARREST Do not perform Intubation, CPR, defibrillation or ACLS    In the event of cardiac or respiratory ARREST Use medication by any route, position, wound care, and other measures to relive pain and suffering. May use oxygen, suction and manual treatment of airway obstruction as needed for comfort.       Home/SNF/Other Home  Chief Complaint flu like symptoms  Level of Care/Admitting Diagnosis ED Disposition    ED Disposition Condition [facility] Area: [place] [100102]  Level of Care: Med-Surg [16]  Diagnosis: Influenza [797282]  Admitting Physician: Merton Border [[id number]]  Attending Physician: Laren Everts, [place]  Estimated length of stay: past midnight tomorrow  Certification:: I certify this patient will need inpatient services for at least 2 midnights  PT Class (Do Not Modify): Inpatient [101]  PT Acc Code (Do Not Modify): Private [1]       Medical History Past Medical History:  Diagnosis Date  . Anxiety   . Arthritis    "  everywhere"  . Cataract    "both eyes; blood pressure's always too high to have them fixed" (09/11/2014)  . Chronic lower back pain   . Claudication in peripheral vascular disease ([place])    02/16/10: Left CIA 9.0x28 Omnilink and REIA 8.0x40 seff expanding Zilver.   right subclavian artery stent 03/18/2008,  . Coronary artery disease   . Herniated lumbar intervertebral disc   . Hypertension   . IDDM (insulin dependent diabetes mellitus) ([place])   . Migraine    "used to have terrible  migraines; stopped in the 1990's"  . Pacemaker   . Peripheral vascular disease ([place])   . Pneumonia X 2  . Renal disorder   . Renovascular hypertension     s/p left renal artery stent 12/2007.  S/P balloon angioplasty on 02/16/10 for ISR, BP was  controlled well since then.     . Stroke [facility]) 1999   Stroke and TIA in 1999 with right sided weakness, now with residual right arm weakness (09/11/2014)  . UTI (lower urinary tract infection) 02/2015    Allergies Allergies  Allergen Reactions  . Norvasc [Amlodipine Besylate] Swelling  . Peanut-Containing Drug Products Other (See Comments)    Stomach pain    IV Location/Drains/Wounds Patient Lines/Drains/Airways Status   Active Line/Drains/Airways    Name:   Placement date:   Placement time:   Site:   Days:   Peripheral IV 05/06/17 Left Antecubital   05/06/17    1920    Antecubital   1   CVC Double Lumen 07/26/16 Right Internal jugular 14 cm   07/26/16    1202     285          Labs/Imaging Results for orders placed or performed during the hospital encounter of 05/06/17 (from the past 48 hour(s))  CBC with Differential     Status: Abnormal   Collection Time: 05/06/17  7:10 PM  Result Value Ref Range   WBC 7.5 4.0 - 10.5 K/uL   RBC 4.58 3.87 - 5.11 MIL/uL   Hemoglobin 14.6 12.0 - 15.0 g/dL   HCT 42.4 36.0 - 46.0 %   MCV 92.6 78.0 - 100.0 fL   MCH 31.9 26.0 - 34.0 pg   MCHC 34.4 30.0 - 36.0 g/dL   RDW 14.0 11.5 - 15.5 %   Platelets 154 150 - 400 K/uL   Neutrophils Relative % 89 %   Neutro Abs 6.6 1.7 - 7.7 K/uL   Lymphocytes Relative 8 %   Lymphs Abs 0.6 (L) 0.7 - 4.0 K/uL   Monocytes Relative 2 %   Monocytes Absolute 0.2 0.1 - 1.0 K/uL   Eosinophils Relative 1 %   Eosinophils Absolute 0.1 0.0 - 0.7 K/uL   Basophils Relative 0 %   Basophils Absolute 0.0 0.0 - 0.1 K/uL    Comment: Performed at [facility], [place] [address], [place], [place] [address]  Comprehensive metabolic panel     Status: Abnormal    Collection Time: 05/06/17  7:10 PM  Result Value Ref Range   Sodium 134 (L) 135 - 145 mmol/L   Potassium 4.8 3.5 - 5.1 mmol/L   Chloride 104 101 - 111 mmol/L   CO2 19 (L) 22 - 32 mmol/L   Glucose, Bld 456 (H) 65 - 99 mg/dL   BUN 43 (H) 6 - 20 mg/dL   Creatinine, Ser 2.21 (H) 0.44 - 1.00 mg/dL   Calcium 7.5 (L) 8.9 - 10.3 mg/dL   Total Protein 7.3 6.5 -  8.1 g/dL   Albumin 3.2 (L) 3.5 - 5.0 g/dL   AST 28 15 - 41 U/L   ALT 29 14 - 54 U/L   Alkaline Phosphatase 102 38 - 126 U/L   Total Bilirubin 0.5 0.3 - 1.2 mg/dL   GFR calc non Af Amer 20 (L) >60 mL/min   GFR calc Af Amer 23 (L) >60 mL/min    Comment: (NOTE) The eGFR has been calculated using the CKD EPI equation. This calculation has not been validated in all clinical situations. eGFR's persistently <60 mL/min signify possible Chronic Kidney Disease.    Anion gap 11 5 - 15    Comment: Performed at [facility], [place] [address] [place], [place] [address]  Influenza panel by PCR (type A & B)     Status: Abnormal   Collection Time: 05/06/17  7:10 PM  Result Value Ref Range   Influenza A By PCR POSITIVE (A) NEGATIVE   Influenza B By PCR NEGATIVE NEGATIVE    Comment: (NOTE) The Xpert Xpress Flu assay is intended as an aid in the diagnosis of  influenza and should not be used as a sole basis for treatment.  This  assay is FDA approved for nasopharyngeal swab specimens only. Nasal  washings and aspirates are unacceptable for Xpert Xpress Flu testing. Performed at [facility], [place] [address] [place], [place] [address]   Troponin I     Status: Abnormal   Collection Time: 05/06/17  7:10 PM  Result Value Ref Range   Troponin I 0.03 (HH) <0.03 ng/mL    Comment: CRITICAL RESULT CALLED TO, READ BACK BY AND VERIFIED WITH: NASH J RN AT 2118 ON 05/06/2017 BY OKOYEHJ Performed at [facility], [place] [address], [place], [place] [address]   I-Stat CG4 Lactic Acid, ED     Status: None    Collection Time: 05/06/17  7:18 PM  Result Value Ref Range   Lactic Acid, Venous 1.20 0.5 - 1.9 mmol/L  Urinalysis, Routine w reflex microscopic     Status: Abnormal   Collection Time: 05/06/17  9:22 PM  Result Value Ref Range   Color, Urine STRAW (A) YELLOW   APPearance CLEAR CLEAR   Specific Gravity, Urine 1.010 1.005 - 1.030   pH 5.0 5.0 - 8.0   Glucose, UA >=500 (A) NEGATIVE mg/dL   Hgb urine dipstick SMALL (A) NEGATIVE   Bilirubin Urine NEGATIVE NEGATIVE   Ketones, ur NEGATIVE NEGATIVE mg/dL   Protein, ur 100 (A) NEGATIVE mg/dL   Nitrite NEGATIVE NEGATIVE   Leukocytes, UA NEGATIVE NEGATIVE   RBC / HPF 0-5 0 - 5 RBC/hpf   WBC, UA 0-5 0 - 5 WBC/hpf   Bacteria, UA RARE (A) NONE SEEN   Squamous Epithelial / LPF 0-5 (A) NONE SEEN    Comment: Performed at [facility], [place] [address] [place], [place] [address]   Dg Chest 2 View  Result Date: 05/06/2017 CLINICAL DATA:  Shortness of breath, congestion and fever for 2 weeks. EXAM: CHEST  2 VIEW COMPARISON:  PA and lateral chest 07/25/2016, 03/27/2016 and 01/13/2016. FINDINGS: The lungs are clear. Heart size is normal. There is no pneumothorax or pleural fluid. Aortic atherosclerosis is noted. Heart size is normal. Pacing device in place. No acute bony abnormality. IMPRESSION: No acute disease. Electronically Signed   By: Inge Rise M.D.   On: 05/06/2017 19:50    Pending Labs [employer] (From admission, onward)   Start  Ordered   Signed and Held  Basic metabolic panel  Tomorrow morning,   R     Signed and Held      Vitals/Pain Today's Vitals   05/06/17 2343 05/07/17 0000 05/07/17 0024 05/07/17 0050  BP: (!) 183/70 (!) 225/87 (!) 197/100 (!) 161/97  Pulse: 90 92 92 87  Resp: (!) 23 (!) 22 18 14   Temp:      TempSrc:      SpO2: 93% 100% 95% 95%  PainSc:        Isolation Precautions Droplet precaution  Medications Medications  metoprolol tartrate (LOPRESSOR) injection 2.5 mg (2.5 mg  Intravenous Given 05/07/17 0018)  sodium chloride 0.9 % bolus 1,000 mL (0 mLs Intravenous Stopped 05/06/17 2022)  insulin aspart (novoLOG) injection 4 Units (4 Units Intravenous Given 05/06/17 2312)  labetalol (NORMODYNE,TRANDATE) injection 10 mg (10 mg Intravenous Given 05/06/17 2324)  oseltamivir (TAMIFLU) capsule 30 mg (30 mg Oral Given 05/06/17 2247)    Mobility non-ambulatory

## 2017-05-08 DIAGNOSIS — E860 Dehydration: Secondary | ICD-10-CM

## 2017-05-08 LAB — CBC
HEMATOCRIT: 36.6 % (ref 36.0–46.0)
HEMOGLOBIN: 12.1 g/dL (ref 12.0–15.0)
MCH: 30.8 pg (ref 26.0–34.0)
MCHC: 33.1 g/dL (ref 30.0–36.0)
MCV: 93.1 fL (ref 78.0–100.0)
Platelets: 132 10*3/uL — ABNORMAL LOW (ref 150–400)
RBC: 3.93 MIL/uL (ref 3.87–5.11)
RDW: 14.1 % (ref 11.5–15.5)
WBC: 7.3 10*3/uL (ref 4.0–10.5)

## 2017-05-08 LAB — ALBUMIN: Albumin: 2.3 g/dL — ABNORMAL LOW (ref 3.5–5.0)

## 2017-05-08 LAB — GLUCOSE, CAPILLARY
GLUCOSE-CAPILLARY: 173 mg/dL — AB (ref 65–99)
GLUCOSE-CAPILLARY: 84 mg/dL (ref 65–99)
Glucose-Capillary: 58 mg/dL — ABNORMAL LOW (ref 65–99)
Glucose-Capillary: 151 mg/dL — ABNORMAL HIGH (ref 65–99)
Glucose-Capillary: 111 mg/dL — ABNORMAL HIGH (ref 65–99)

## 2017-05-08 LAB — BASIC METABOLIC PANEL
ANION GAP: 8 (ref 5–15)
BUN: 36 mg/dL — ABNORMAL HIGH (ref 6–20)
CO2: 17 mmol/L — AB (ref 22–32)
Calcium: 6.3 mg/dL — CL (ref 8.9–10.3)
Chloride: 113 mmol/L — ABNORMAL HIGH (ref 101–111)
Creatinine, Ser: 1.97 mg/dL — ABNORMAL HIGH (ref 0.44–1.00)
GFR calc Af Amer: 26 mL/min — ABNORMAL LOW (ref >60)
GFR calc non Af Amer: 23 mL/min — ABNORMAL LOW (ref >60)
GLUCOSE: 59 mg/dL — AB (ref 65–99)
POTASSIUM: 3.6 mmol/L (ref 3.5–5.1)
Sodium: 138 mmol/L (ref 135–145)

## 2017-05-08 MED ORDER — CALCIUM CARBONATE-VITAMIN D 500-200 MG-UNIT PO TABS
1.0000 | ORAL_TABLET | Freq: Two times a day (BID) | ORAL | Status: DC
  Administered 2017-05-08 – 2017-05-10 (×4): 1 via ORAL
  Filled 2017-05-08 (×5): qty 1

## 2017-05-08 MED ORDER — SODIUM CHLORIDE 0.9 % IV SOLN
1.0000 g | Freq: Once | INTRAVENOUS | Status: AC
  Administered 2017-05-08: 1 g via INTRAVENOUS
  Filled 2017-05-08: qty 1.1

## 2017-05-08 MED ORDER — INSULIN ASPART PROT & ASPART (70-30 MIX) 100 UNIT/ML ~~LOC~~ SUSP
5.0000 [IU] | Freq: Two times a day (BID) | SUBCUTANEOUS | Status: DC
  Administered 2017-05-08 – 2017-05-10 (×3): 5 [IU] via SUBCUTANEOUS

## 2017-05-08 MED ORDER — METOPROLOL TARTRATE 5 MG/5ML IV SOLN: 2.5000 mg | INTRAVENOUS | Status: DC | PRN

## 2017-05-08 NOTE — Progress Notes (Signed)
PROGRESS NOTE    Carmen [name]  BTD:[id number] DOB: 1933/10/18 DOA: 05/06/2017 PCP: Glendale Chard, MD    Brief Narrative:  Carmen [name]  is a 84 y.o. female, with past medical history significant for hypertension , diabetes mellitus and chronic renal failure presenting with worsening mental status and weakness for the last few days   Assessment & Plan:   Active Problems:   Diabetes mellitus ([place])   History of CVA (cerebrovascular accident) with associated mild right upper extremity hemiplegia   HTN (hypertension)   Dyslipidemia   Dehydration   Protein-calorie malnutrition, severe (HCC)   Hypocalcemia   Influenza   Dehydration:  From flu, improving with IV fluids.    Type 2 DM: CBG (last 3)  Recent Labs    05/08/17 0729 05/08/17 0842 05/08/17 1328  GLUCAP 58* 173* 151*   Decrease the novolog 70/30 to 5 units.BID SINCE she had an episode of hypoglycemia this am.    HYPERTENSION: Well controlled.    Acute on Stage 4 CKD: Improved with hydration. Creatinine at baseline appears to be around 1.5. Its 1.7.    Influenza A: On tamiflu.    Hyponatremia:  Improved with hydration.   Generalized weakness:  Sec to influenza. PT evaluation.    Acute encephalopathy: sec to dehydration and infection. She currently appears to be at baseline.   Hypocalcemia: Corrected calcium around 7. 1 g of calcium gluconate ordered.  Calcium and vit D supplementation.  Vit D levels ordered.     DVT prophylaxis: (SQ Heparin.  Code Status: (DNR.  Family Communication: discussed with grand daughter at bedside.  Disposition Plan: pending PT evaluation.   Consultants:   None.    Procedures: (none.    Antimicrobials: tamiflu.    Subjective: Denies any new complaints.  She continues to be weak.   Objective: Vitals:   05/07/17 1425 05/07/17 2020 05/08/17 0512 05/08/17 1500  BP: (!) 113/47 (!) 119/52 (!) 168/64 (!) 146/59  Pulse: 67 64 64 99  Resp:  19 18 18     Temp: 97.7 F (36.5 C) 98.2 F (36.8 C) 97.7 F (36.5 C) 98.4 F (36.9 C)  TempSrc: Oral Oral Oral Oral  SpO2: 93% 99% 98% 97%  Weight:   40.8 kg (89 lb 15.2 oz)   Height:   5\' 4"  (1.626 m)     Intake/Output Summary (Last 24 hours) at 05/08/2017 1634 Last data filed at 05/08/2017 0847 Gross per 24 hour  Intake 2261.25 ml  Output 350 ml  Net 1911.25 ml   Filed Weights   05/08/17 0512  Weight: 40.8 kg (89 lb 15.2 oz)    Examination:  General exam: frail elderly lady not in distress.  Respiratory system:air entry FAIR. No wheezing or rhonchi.  Cardiovascular system: S1 & S2 heard, RRR. No JVD, . No pedal edema. Gastrointestinal system: abd is soft NT ND BS+ Central nervous system: Alert and oriented to place and person.  Extremities: Symmetric 5 x 5 power. Skin: No rashes, lesions or ulcers Psychiatry:Mood & affect appropriate.     Data Reviewed: I have personally reviewed following labs and imaging studies  CBC: Recent Labs  Lab 05/06/17 1910 05/08/17 0606  WBC 7.5 7.3  NEUTROABS 6.6  --   HGB 14.6 12.1  HCT 42.4 36.6  MCV 92.6 93.1  PLT 154 106*   Basic Metabolic Panel: Recent Labs  Lab 05/06/17 1910 05/07/17 0541 05/08/17 0606  NA 134* 137 138  K 4.8 3.6 3.6  CL 104 110  113*  CO2 19* 17* 17*  GLUCOSE 456* 243* 59*  BUN 43* 38* 36*  CREATININE 2.21* 2.04* 1.97*  CALCIUM 7.5* 7.0* 6.3*   GFR: Estimated Creatinine Clearance: 14.4 mL/min (A) (by C-G formula based on SCr of 1.97 mg/dL (H)). Liver Function Tests: Recent Labs  Lab 05/06/17 1910 05/08/17 0600  AST 28  --   ALT 29  --   ALKPHOS 102  --   BILITOT 0.5  --   PROT 7.3  --   ALBUMIN 3.2* 2.3*   No results for input(s): LIPASE, AMYLASE in the last 168 hours. No results for input(s): AMMONIA in the last 168 hours. Coagulation Profile: No results for input(s): INR, PROTIME in the last 168 hours. Cardiac Enzymes: Recent Labs  Lab 05/06/17 1910  TROPONINI 0.03*   BNP (last 3  results) No results for input(s): PROBNP in the last 8760 hours. HbA1C: No results for input(s): HGBA1C in the last 72 hours. CBG: Recent Labs  Lab 05/07/17 1711 05/07/17 2018 05/08/17 0729 05/08/17 0842 05/08/17 1328  GLUCAP 135* 117* 58* 173* 151*   Lipid Profile: No results for input(s): CHOL, HDL, LDLCALC, TRIG, CHOLHDL, LDLDIRECT in the last 72 hours. Thyroid Function Tests: No results for input(s): TSH, T4TOTAL, FREET4, T3FREE, THYROIDAB in the last 72 hours. Anemia Panel: No results for input(s): VITAMINB12, FOLATE, FERRITIN, TIBC, IRON, RETICCTPCT in the last 72 hours. Sepsis Labs: Recent Labs  Lab 05/06/17 1918  LATICACIDVEN 1.20    No results found for this or any previous visit (from the past 240 hour(s)).       Radiology Studies: Dg Chest 2 View  Result Date: 05/06/2017 CLINICAL DATA:  Shortness of breath, congestion and fever for 2 weeks. EXAM: CHEST  2 VIEW COMPARISON:  PA and lateral chest 07/25/2016, 03/27/2016 and 01/13/2016. FINDINGS: The lungs are clear. Heart size is normal. There is no pneumothorax or pleural fluid. Aortic atherosclerosis is noted. Heart size is normal. Pacing device in place. No acute bony abnormality. IMPRESSION: No acute disease. Electronically Signed   By: Inge Rise M.D.   On: 05/06/2017 19:50        Scheduled Meds: . aspirin EC  81 mg Oral Daily  . calcium-vitamin D  1 tablet Oral BID  . clopidogrel  75 mg Oral Daily  . docusate sodium  100 mg Oral Daily  . heparin  5,000 Units Subcutaneous Q8H  . insulin aspart  0-5 Units Subcutaneous QHS  . insulin aspart  0-9 Units Subcutaneous TID WC  . insulin aspart protamine- aspart  8 Units Subcutaneous BID WC  . isosorbide mononitrate  120 mg Oral Daily  . metoprolol tartrate  25 mg Oral BID  . oseltamivir  30 mg Oral Daily   Continuous Infusions: . sodium chloride 75 mL/hr at 05/08/17 1457  . calcium gluconate       LOS: 2 days    Time spent: 35 minutes.      Hosie Poisson, MD [facility] Pager [phone]  If 7PM-7AM, please contact night-coverage [email or web address] Password [facility] 05/08/2017, 4:34 PM

## 2017-05-08 NOTE — Progress Notes (Signed)
Alerted MD to Critical Ca of 6.3 via text.

## 2017-05-09 DIAGNOSIS — Z8673 Personal history of transient ischemic attack (TIA), and cerebral infarction without residual deficits: Secondary | ICD-10-CM

## 2017-05-09 LAB — GLUCOSE, CAPILLARY
GLUCOSE-CAPILLARY: 77 mg/dL (ref 65–99)
Glucose-Capillary: 281 mg/dL — ABNORMAL HIGH (ref 65–99)
Glucose-Capillary: 123 mg/dL — ABNORMAL HIGH (ref 65–99)
Glucose-Capillary: 124 mg/dL — ABNORMAL HIGH (ref 65–99)

## 2017-05-09 LAB — BASIC METABOLIC PANEL
Anion gap: 9 (ref 5–15)
BUN: 36 mg/dL — ABNORMAL HIGH (ref 6–20)
CO2: 16 mmol/L — ABNORMAL LOW (ref 22–32)
Calcium: 7.3 mg/dL — ABNORMAL LOW (ref 8.9–10.3)
Chloride: 114 mmol/L — ABNORMAL HIGH (ref 101–111)
Creatinine, Ser: 1.92 mg/dL — ABNORMAL HIGH (ref 0.44–1.00)
GFR calc Af Amer: 27 mL/min — ABNORMAL LOW (ref >60)
GFR, EST NON AFRICAN AMERICAN: 23 mL/min — AB (ref >60)
GLUCOSE: 72 mg/dL (ref 65–99)
POTASSIUM: 4.3 mmol/L (ref 3.5–5.1)
Sodium: 139 mmol/L (ref 135–145)

## 2017-05-09 NOTE — Care Management Important Message (Addendum)
Important Message  Patient Details IM Letter given to Nora/Case Manager to present to the Patient Name: KASHMERE DAYWALT MRN: [id number] Date of Birth: November 28, 1933   Medicare Important Message Given:  Yes    Kerin Salen 05/09/2017, 10:56 AMImportant Message  Patient Details  Name: LINDSY CERULLO MRN: [id number] Date of Birth: [birth year]   Medicare Important Message Given:  Yes    Kerin Salen 05/09/2017, 10:56 AM

## 2017-05-09 NOTE — Progress Notes (Signed)
PROGRESS NOTE    Carmen [name]  QJJ:[id number] DOB: Jul 30, 1933 DOA: 05/06/2017 PCP: Glendale Chard, MD    Brief Narrative:  Carmen [name]  is a 84 y.o. female, with past medical history significant for hypertension , diabetes mellitus and chronic renal failure presenting with worsening mental status and weakness for the last few days. She was found to have influenza and in acute renal failure.    Assessment & Plan:   Active Problems:   Diabetes mellitus ([place])   History of CVA (cerebrovascular accident) with associated mild right upper extremity hemiplegia   HTN (hypertension)   Dyslipidemia   Dehydration   Protein-calorie malnutrition, severe (HCC)   Hypocalcemia   Influenza   Dehydration:  From flu, improving with IV fluids.  Her renal parameters have improved.    Type 2 DM: CBG (last 3)  Recent Labs    05/08/17 2015 05/09/17 0728 05/09/17 1132  GLUCAP 111* 77 281*   Resume novolog BID.  No change in meds today.    HYPERTENSION: Well controlled.    Acute on Stage 4 CKD: Improved with hydration. Creatinine at baseline appears to be around 1.5. Its 1.9 today.    Influenza A: On tamiflu to complete the course.    Hyponatremia:  Improved with hydration.   Generalized weakness:  Sec to influenza. PT evaluation.    Acute encephalopathy: sec to dehydration and infection. She currently appears to be at baseline.   Hypocalcemia: Corrected calcium around 7. 1 g of calcium gluconate ordered.  Calcium and vit D supplementation.  Vit D levels ordered. Repeat Calcium improved.     DVT prophylaxis: (SQ Heparin.  Code Status: (DNR.  Family Communication: discussed with grand daughter at bedside.  Disposition Plan: pending PT evaluation.   Consultants:   None.    Procedures: (none.    Antimicrobials: tamiflu.    Subjective: In good spirits, but still very weak   Objective: Vitals:   05/08/17 0512 05/08/17 1500 05/08/17 2016 05/09/17 0511    BP: (!) 168/64 (!) 146/59 (!) 155/57 (!) 150/62  Pulse: 64 99 62 80  Resp: 18 18 18 16   Temp: 97.7 F (36.5 C) 98.4 F (36.9 C) 98.6 F (37 C) 98.2 F (36.8 C)  TempSrc: Oral Oral Oral Oral  SpO2: 98% 97% 95% 96%  Weight: 40.8 kg (89 lb 15.2 oz)     Height: 5\' 4"  (1.626 m)       Intake/Output Summary (Last 24 hours) at 05/09/2017 1239 Last data filed at 05/09/2017 4818 Gross per 24 hour  Intake 720 ml  Output -  Net 720 ml   Filed Weights   05/08/17 0512  Weight: 40.8 kg (89 lb 15.2 oz)    Examination:  General exam: frail elderly lady not in distress. In good spirits.  Respiratory system:good air entry bilateral. No wheezing or rhonchi.  Cardiovascular system: S1 & S2 heard, RRR. No JVD, . No pedal edema. Gastrointestinal system: abd is soft non tender non distended bowel sounds heard.  Central nervous system: Alert and oriented to place and person. Non focal. Extremities: Symmetric 5 x 5 power. Skin: No rashes, lesions or ulcers Psychiatry:Mood & affect appropriate.     Data Reviewed: I have personally reviewed following labs and imaging studies  CBC: Recent Labs  Lab 05/06/17 1910 05/08/17 0606  WBC 7.5 7.3  NEUTROABS 6.6  --   HGB 14.6 12.1  HCT 42.4 36.6  MCV 92.6 93.1  PLT 154 563*   Basic Metabolic  Panel: Recent Labs  Lab 05/06/17 1910 05/07/17 0541 05/08/17 0606 05/09/17 0636  NA 134* 137 138 139  K 4.8 3.6 3.6 4.3  CL 104 110 113* 114*  CO2 19* 17* 17* 16*  GLUCOSE 456* 243* 59* 72  BUN 43* 38* 36* 36*  CREATININE 2.21* 2.04* 1.97* 1.92*  CALCIUM 7.5* 7.0* 6.3* 7.3*   GFR: Estimated Creatinine Clearance: 14.8 mL/min (A) (by C-G formula based on SCr of 1.92 mg/dL (H)). Liver Function Tests: Recent Labs  Lab 05/06/17 1910 05/08/17 0600  AST 28  --   ALT 29  --   ALKPHOS 102  --   BILITOT 0.5  --   PROT 7.3  --   ALBUMIN 3.2* 2.3*   No results for input(s): LIPASE, AMYLASE in the last 168 hours. No results for input(s): AMMONIA  in the last 168 hours. Coagulation Profile: No results for input(s): INR, PROTIME in the last 168 hours. Cardiac Enzymes: Recent Labs  Lab 05/06/17 1910  TROPONINI 0.03*   BNP (last 3 results) No results for input(s): PROBNP in the last 8760 hours. HbA1C: No results for input(s): HGBA1C in the last 72 hours. CBG: Recent Labs  Lab 05/08/17 1328 05/08/17 1651 05/08/17 2015 05/09/17 0728 05/09/17 1132  GLUCAP 151* 84 111* 77 281*   Lipid Profile: No results for input(s): CHOL, HDL, LDLCALC, TRIG, CHOLHDL, LDLDIRECT in the last 72 hours. Thyroid Function Tests: No results for input(s): TSH, T4TOTAL, FREET4, T3FREE, THYROIDAB in the last 72 hours. Anemia Panel: No results for input(s): VITAMINB12, FOLATE, FERRITIN, TIBC, IRON, RETICCTPCT in the last 72 hours. Sepsis Labs: Recent Labs  Lab 05/06/17 1918  LATICACIDVEN 1.20    No results found for this or any previous visit (from the past 240 hour(s)).       Radiology Studies: No results found.      Scheduled Meds: . aspirin EC  81 mg Oral Daily  . calcium-vitamin D  1 tablet Oral BID  . clopidogrel  75 mg Oral Daily  . docusate sodium  100 mg Oral Daily  . heparin  5,000 Units Subcutaneous Q8H  . insulin aspart  0-5 Units Subcutaneous QHS  . insulin aspart  0-9 Units Subcutaneous TID WC  . insulin aspart protamine- aspart  5 Units Subcutaneous BID WC  . isosorbide mononitrate  120 mg Oral Daily  . metoprolol tartrate  25 mg Oral BID  . oseltamivir  30 mg Oral Daily   Continuous Infusions:    LOS: 3 days    Time spent: 35 minutes.     Hosie Poisson, MD [facility] Pager [phone]  If 7PM-7AM, please contact night-coverage [email or web address] Password [facility] 05/09/2017, 12:39 PM

## 2017-05-10 LAB — GLUCOSE, CAPILLARY
Glucose-Capillary: 140 mg/dL — ABNORMAL HIGH (ref 65–99)
Glucose-Capillary: 95 mg/dL (ref 65–99)

## 2017-05-10 LAB — VITAMIN D 25 HYDROXY (VIT D DEFICIENCY, FRACTURES): Vit D, 25-Hydroxy: 6.2 ng/mL — ABNORMAL LOW (ref 30.0–100.0)

## 2017-05-10 MED ORDER — VITAMIN D (ERGOCALCIFEROL) 1.25 MG (50000 UNIT) PO CAPS
50000.0000 [IU] | ORAL_CAPSULE | ORAL | Status: DC
  Administered 2017-05-10: 50000 [IU] via ORAL
  Filled 2017-05-10: qty 1

## 2017-05-10 MED ORDER — HYDRALAZINE HCL 10 MG PO TABS: 10.0000 mg | ORAL_TABLET | Freq: Three times a day (TID) | ORAL | 0 refills | Status: AC

## 2017-05-10 MED ORDER — VITAMIN D (ERGOCALCIFEROL) 1.25 MG (50000 UNIT) PO CAPS: 50000.0000 [IU] | ORAL_CAPSULE | ORAL | 0 refills | Status: AC

## 2017-05-10 MED ORDER — OSELTAMIVIR PHOSPHATE 30 MG PO CAPS: 30.0000 mg | ORAL_CAPSULE | Freq: Every day | ORAL | 0 refills | Status: DC

## 2017-05-10 MED ORDER — ISOSORBIDE MONONITRATE ER 120 MG PO TB24: 120.0000 mg | ORAL_TABLET | Freq: Every day | ORAL | 0 refills | Status: AC

## 2017-05-10 NOTE — Evaluation (Signed)
Physical Therapy Evaluation Patient Details Name: Carmen [name] MRN: [id number] DOB: 02/17/1934 Today's Date: 05/10/2017   History of Present Illness  Carmen [name]  is a 84 y.o. female, with past medical history significant for hypertension , diabetes mellitus and chronic renal failure presenting with worsening mental status and weakness for the last few days. She was found to have influenza and in acute renal failure  Clinical Impression  The patient appears at baseline. Ambulated in hall with RW, she cruises in the house by furniture walking. No further PT needs as patient is to Dc home.     Follow Up Recommendations No PT follow up    Equipment Recommendations  None recommended by PT    Recommendations for Other Services       Precautions / Restrictions Precautions Precautions: Fall Precaution Comments: droplet      Mobility  Bed Mobility Overal bed mobility: Needs Assistance Bed Mobility: Supine to Sit     Supine to sit: Supervision        Transfers   Equipment used: Rolling walker (2 wheeled);1 person hand held assist             General transfer comment: ste3ady assist to stand, uses bar beside toilet  Ambulation/Gait Ambulation/Gait assistance: Min assist;Min guard Ambulation Distance (Feet): 60 Feet Assistive device: Rolling walker (2 wheeled) Gait Pattern/deviations: Step-to pattern;Step-through pattern     General Gait Details: able to grip the RW with right hand, cruiosed to BR holding to bed and door frame and rails in BR. requires steady assist without RW  Stairs            Wheelchair Mobility    Modified Rankin (Stroke Patients Only)       Balance Overall balance assessment: Needs assistance Sitting-balance support: No upper extremity supported;Feet supported Sitting balance-Leahy Scale: Good     Standing balance support: During functional activity;Single extremity supported Standing balance-Leahy Scale: Poor                               Pertinent Vitals/Pain Pain Assessment: Faces Faces Pain Scale: Hurts little more Pain Location: right lower leg Pain Descriptors / Indicators: Discomfort Pain Intervention(s): Monitored during session    Home Living Family/patient expects to be discharged to:: Private residence Living Arrangements: Spouse/significant other;Children Available Help at Discharge: Family;Available 24 hours/day Type of Home: House Home Access: Stairs to enter Entrance Stairs-Rails: Left Entrance Stairs-Number of Steps: 2 Home Layout: One level Home Equipment: Walker - 2 wheels;Cane - single point;Bedside commode;Hand held shower head;Tub bench      Prior Function Level of Independence: Needs assistance         Comments: Furniture walks at home. Has a SPC and RW if needed. Sometimes needs assist with bathing/dressing, not always.     Hand Dominance        Extremity/Trunk Assessment   Upper Extremity Assessment Upper Extremity Assessment: RUE deficits/detail RUE Deficits / Details: gross  hand grip, decreased elbow extension    Lower Extremity Assessment Lower Extremity Assessment: Generalized weakness    Cervical / Trunk Assessment Cervical / Trunk Assessment: Kyphotic  Communication   Communication: No difficulties  Cognition Arousal/Alertness: Awake/alert Behavior During Therapy: WFL for tasks assessed/performed Overall Cognitive Status: Within Functional Limits for tasks assessed  General Comments      Exercises     Assessment/Plan    PT Assessment Patent does not need any further PT services(patient is dc'd home today)  PT Problem List Decreased range of motion;Decreased strength;Decreased activity tolerance;Decreased safety awareness;Decreased balance;Decreased knowledge of precautions;Decreased mobility       PT Treatment Interventions      PT Goals (Current goals can be found in the  Care Plan section)  Acute Rehab PT Goals Patient Stated Goal: go home PT Goal Formulation: All assessment and education complete, DC therapy    Frequency     Barriers to discharge        Co-evaluation               AM-PAC PT "6 Clicks" Daily Activity  Outcome Measure Difficulty turning over in bed (including adjusting bedclothes, sheets and blankets)?: A Little Difficulty moving from lying on back to sitting on the side of the bed? : A Little Difficulty sitting down on and standing up from a chair with arms (e.g., wheelchair, bedside commode, etc,.)?: A Lot Help needed moving to and from a bed to chair (including a wheelchair)?: A Lot Help needed walking in hospital room?: A Lot Help needed climbing 3-5 steps with a railing? : A Lot 6 Click Score: 14    End of Session Equipment Utilized During Treatment: Gait belt Activity Tolerance: Patient tolerated treatment well Patient left: in chair;with call bell/phone within reach;with chair alarm set Nurse Communication: Mobility status PT Visit Diagnosis: Unsteadiness on feet (R26.81)    Time: 9675-9163 PT Time Calculation (min) (ACUTE ONLY): 36 min   Charges:   PT Evaluation $PT Eval Low Complexity: 1 Low PT Treatments $Gait Training: 8-22 mins   PT G CodesTresa Endo PT [phone]  Carmen [name] 05/10/2017, 10:45 AM

## 2017-05-10 NOTE — Discharge Summary (Signed)
Physician Discharge Summary  EARMA NICOLAOU OZD:[id number] DOB: Sep 06, 1933 DOA: 05/06/2017  PCP: Glendale Chard, MD  Admit date: 05/06/2017 Discharge date: 05/10/2017  Admitted From: Home  Disposition:  Home.   Recommendations for Outpatient Follow-up:  1. Follow up with PCP in 1-2 weeks 2. Please obtain BMP/CBC in one week Please follow up repeat VIT D Levels after the course.    Discharge Condition:STABLE.  CODE STATUS:DNR Diet recommendation: Heart Healthy    Brief/Interim Summary: MaxineAlfordis a39 y.o.female,with past medical history significant for hypertension,diabetes mellitus and chronic renal failure presenting with worsening mental status and weakness for the last few days. She was found to have influenza and in acute renal failure.    Discharge Diagnoses:  Active Problems:   Diabetes mellitus ([place])   History of CVA (cerebrovascular accident) with associated mild right upper extremity hemiplegia   HTN (hypertension)   Dyslipidemia   Dehydration   Protein-calorie malnutrition, severe (HCC)   Hypocalcemia   Influenza   Dehydration:  From flu, improving with IV fluids.  Her renal parameters have improved.    Type 2 DM:  CBG (last 3)  Recent Labs    05/09/17 2142 05/10/17 0725 05/10/17 1117  GLUCAP 124* 140* 95   Resume home meds.    HYPERTENSION: Well controlled.    Acute on Stage 4 CKD: Improved with hydration. Creatinine at baseline appears to be around 1.5. Its 1.9 on discharge, recommend outpatient follow up with nephrology.    Influenza A: On tamiflu to complete the course.    Hyponatremia:  Improved with hydration.   Generalized weakness:  Sec to influenza.PT eval recommended no follow up needed.    Acute encephalopathy: sec to dehydration and infection. She currently appears to be at baseline.   Hypocalcemia: Corrected calcium around 7. 1 g of calcium gluconate ordered.  Calcium and vit D supplementation.   Vit D levels ordered, very low, supplementation and follow up with PCP.        Discharge Instructions  Discharge Instructions    Diet - low sodium heart healthy   Complete by:  As directed    Increase activity slowly   Complete by:  As directed      Allergies as of 05/10/2017      Reactions   Norvasc [amlodipine Besylate] Swelling   Peanut-containing Drug Products Other (See Comments)   Stomach pain      Medication List    TAKE these medications   acetaminophen 325 MG tablet Commonly known as:  TYLENOL Take 2 tablets (650 mg total) by mouth every 4 (four) hours as needed for headache or mild pain.   aspirin EC 81 MG tablet Take 81 mg by mouth daily.   calcium-vitamin D 500-200 MG-UNIT tablet Commonly known as:  OSCAL WITH D Take 2 tablets by mouth 4 (four) times daily.   clopidogrel 75 MG tablet Commonly known as:  PLAVIX Take 75 mg by mouth daily.   diclofenac sodium 1 % Gel Commonly known as:  VOLTAREN Apply 2 g topically 4 (four) times daily.   docusate sodium 100 MG capsule Commonly known as:  COLACE Take 1 capsule (100 mg total) by mouth daily.   Fish Oil 1000 MG Caps Take 1,000 mg by mouth 2 (two) times daily.   furosemide 20 MG tablet Commonly known as:  LASIX Take 1 tablet (20 mg total) by mouth daily. Resume at lower dose in 2 days. What changed:    when to take this  additional instructions  insulin NPH-regular Human (70-30) 100 UNIT/ML injection Commonly known as:  NOVOLIN 70/30 Inject 5 Units into the skin 2 (two) times daily.   isosorbide mononitrate 120 MG 24 hr tablet Commonly known as:  IMDUR Take 1 tablet (120 mg total) by mouth daily. Resume in 2 days.   metoprolol tartrate 25 MG tablet Commonly known as:  LOPRESSOR Take 25 mg by mouth 2 (two) times daily.   oseltamivir 30 MG capsule Commonly known as:  TAMIFLU Take 1 capsule (30 mg total) by mouth daily. Start taking on:  05/11/2017   potassium chloride SA 20 MEQ  tablet Commonly known as:  K-DUR,KLOR-CON Take 1 tablet (20 mEq total) by mouth daily.   Vitamin D (Ergocalciferol) 50000 units Caps capsule Commonly known as:  DRISDOL Take 1 capsule (50,000 Units total) by mouth every 7 (seven) days. Start taking on:  05/17/2017       Allergies  Allergen Reactions  . Norvasc [Amlodipine Besylate] Swelling  . Peanut-Containing Drug Products Other (See Comments)    Stomach pain    Consultations:  None.    Procedures/Studies: Dg Chest 2 View  Result Date: 05/06/2017 CLINICAL DATA:  Shortness of breath, congestion and fever for 2 weeks. EXAM: CHEST  2 VIEW COMPARISON:  PA and lateral chest 07/25/2016, 03/27/2016 and 01/13/2016. FINDINGS: The lungs are clear. Heart size is normal. There is no pneumothorax or pleural fluid. Aortic atherosclerosis is noted. Heart size is normal. Pacing device in place. No acute bony abnormality. IMPRESSION: No acute disease. Electronically Signed   By: Inge Rise M.D.   On: 05/06/2017 19:50     Subjective: Denies any cough, or sob, is able to walk to the bathroom , no new complaints.   Discharge Exam: Vitals:   05/09/17 2139 05/10/17 0458  BP: (!) 165/61 (!) 191/67  Pulse: 60 60  Resp: 18 16  Temp: 98.4 F (36.9 C) 98.6 F (37 C)  SpO2: 95% 98%   Vitals:   05/09/17 0511 05/09/17 1436 05/09/17 2139 05/10/17 0458  BP: (!) 150/62 (!) 187/65 (!) 165/61 (!) 191/67  Pulse: 80 60 60 60  Resp: 16 18 18 16   Temp: 98.2 F (36.8 C) 98.9 F (37.2 C) 98.4 F (36.9 C) 98.6 F (37 C)  TempSrc: Oral Oral Oral Oral  SpO2: 96% 95% 95% 98%  Weight:      Height:        General: Pt is alert, awake, not in acute distress Cardiovascular: RRR, S1/S2 +, no rubs, no gallops Respiratory: CTA bilaterally, no wheezing, no rhonchi Abdominal: Soft, NT, ND, bowel sounds + Extremities: no edema, no cyanosis    The results of significant diagnostics from this hospitalization (including imaging, microbiology,  ancillary and laboratory) are listed below for reference.     Microbiology: No results found for this or any previous visit (from the past 240 hour(s)).   Labs: BNP (last 3 results) No results for input(s): BNP in the last 8760 hours. Basic Metabolic Panel: Recent Labs  Lab 05/06/17 1910 05/07/17 0541 05/08/17 0606 05/09/17 0636  NA 134* 137 138 139  K 4.8 3.6 3.6 4.3  CL 104 110 113* 114*  CO2 19* 17* 17* 16*  GLUCOSE 456* 243* 59* 72  BUN 43* 38* 36* 36*  CREATININE 2.21* 2.04* 1.97* 1.92*  CALCIUM 7.5* 7.0* 6.3* 7.3*   Liver Function Tests: Recent Labs  Lab 05/06/17 1910 05/08/17 0600  AST 28  --   ALT 29  --   ALKPHOS 102  --  BILITOT 0.5  --   PROT 7.3  --   ALBUMIN 3.2* 2.3*   No results for input(s): LIPASE, AMYLASE in the last 168 hours. No results for input(s): AMMONIA in the last 168 hours. CBC: Recent Labs  Lab 05/06/17 1910 05/08/17 0606  WBC 7.5 7.3  NEUTROABS 6.6  --   HGB 14.6 12.1  HCT 42.4 36.6  MCV 92.6 93.1  PLT 154 132*   Cardiac Enzymes: Recent Labs  Lab 05/06/17 1910  TROPONINI 0.03*   BNP: Invalid input(s): POCBNP CBG: Recent Labs  Lab 05/09/17 1132 05/09/17 1628 05/09/17 2142 05/10/17 0725 05/10/17 1117  GLUCAP 281* 123* 124* 140* 95   D-Dimer No results for input(s): DDIMER in the last 72 hours. Hgb A1c No results for input(s): HGBA1C in the last 72 hours. Lipid Profile No results for input(s): CHOL, HDL, LDLCALC, TRIG, CHOLHDL, LDLDIRECT in the last 72 hours. Thyroid function studies No results for input(s): TSH, T4TOTAL, T3FREE, THYROIDAB in the last 72 hours.  Invalid input(s): FREET3 Anemia work up No results for input(s): VITAMINB12, FOLATE, FERRITIN, TIBC, IRON, RETICCTPCT in the last 72 hours. Urinalysis    Component Value Date/Time   COLORURINE STRAW (A) 05/06/2017 2122   APPEARANCEUR CLEAR 05/06/2017 2122   LABSPEC 1.010 05/06/2017 2122   PHURINE 5.0 05/06/2017 2122   GLUCOSEU >=500 (A)  05/06/2017 2122   HGBUR SMALL (A) 05/06/2017 2122   BILIRUBINUR NEGATIVE 05/06/2017 2122   [place] NEGATIVE 05/06/2017 2122   PROTEINUR 100 (A) 05/06/2017 2122   UROBILINOGEN 0.2 01/13/2015 2034   NITRITE NEGATIVE 05/06/2017 2122   LEUKOCYTESUR NEGATIVE 05/06/2017 2122   Sepsis Labs Invalid input(s): PROCALCITONIN,  WBC,  LACTICIDVEN Microbiology No results found for this or any previous visit (from the past 240 hour(s)).   Time coordinating discharge: Over 30 minutes  SIGNED:   Hosie Poisson, MD  [facility] 05/10/2017, 12:15 PM Pager   If 7PM-7AM, please contact night-coverage [email or web address] Password [email or web address]

## 2017-05-10 NOTE — Progress Notes (Signed)
This CM spoke with pt at bedside for disposition planning. Pt state her husband and son help her at home and she doesn't need home health services at this time. Marney Doctor RN,BSN,NCM [phone]

## 2017-05-12 ENCOUNTER — Other Ambulatory Visit: Payer: Self-pay

## 2017-05-12 NOTE — Patient Outreach (Signed)
[place] [facility]) Care Management  05/12/2017  Carmen [name] 11/29/1933 [id number]   [employer] Referral received 05/12/17: Client discharged from [place] on 05/10/17. RNCM called to complete transition of care call. Client was asleep.   Plan: RNCM will attempt to reach client tomorrow.  Thea Silversmith, RN, MSN, [place] Coordinator Cell: [phone]

## 2017-05-13 ENCOUNTER — Other Ambulatory Visit: Payer: Self-pay

## 2017-05-13 NOTE — Patient Outreach (Signed)
[place] [facility]) Care Management  05/13/2017  Carmen [name] [birth year] [id number]   [employer] Referral: client discharged from [place] on 05/10/17.  Subjective: "I am feeling better".  Objective: none  Assessment: 84 year old with history of HTN, DM, Chronic renal failure, CVA, protein calorie malnutrition. recent admission due to influenza. 1 admission in 6 months and 2 Emergency room visits in 6 months.   RNCM called to complete transition of care. RNCM initially spoke with client who was able to identify name and date of birth, but is confused. Client had to verify address with husband. When asked about medications, client asked RNCM to call the hospital to discuss the medications she is on. Client agrees to [facility] speaking to husband regarding transition of care.  Mr. Lewison reports he manages client's care and her medications. When RNCM attempted to review medications, Mr. Sem repeatedly stated that everything was taken care of.  He states he was recently in the hospital and has a home health nurse seeing him. He states he does not understand why a nurse can't come out to see client. Mr. Janvier report client has an appointment with provider next week and adds he will ask primary care about a nurse for Mrs. Seckel. He states his daughter is going to provide transportation to the appointment.   Mr. Noland is interested in getting Medicaid for client. He states he does not understand why she is not able to get Medicaid.  It is somewhat difficult to understand Mr. Calame per telephone and therefore provides a barrier to assess client's needs telephonically.   Plan: Community care coordinator referral and social work referral.  Thea Silversmith, RN, MSN, [place] Coordinator Cell: [phone]

## 2017-05-16 ENCOUNTER — Other Ambulatory Visit: Payer: Self-pay | Admitting: *Deleted

## 2017-05-16 NOTE — Patient Outreach (Signed)
[place] [facility]) Care Management  05/16/2017  BARBARAJEAN KINZLER 02-Jun-1933 [id number]   Referral received for further assessment in the home and transition of care involvement as she was recently hospitalized for the flu.  Initial transition of care assessment completed by Erskin Burnet prior to referral.  Call placed today to introduce self as assigned care manager, no answer.  HIPAA compliant voice message left, will await call back.  If no call back will follow up tomorrow.  Valente David, [place], MSN [place] [phone]

## 2017-05-17 ENCOUNTER — Other Ambulatory Visit: Payer: Self-pay | Admitting: *Deleted

## 2017-05-17 NOTE — Patient Outreach (Signed)
Patient triggered Red on [place] [facility] Discharge Dashboard, notification sent to [place], RN and Eula Fried, LCSW

## 2017-05-17 NOTE — Patient Outreach (Signed)
[place] [facility]) Care Management  05/17/2017  Carmen [name] 12/06/1933 [id number]   Return call from patient's spouse who states that patient has been approved for Medicaid and would like assistance with applying for personal care services for her. Per patient's spouse, he is the primary caregiver with very little family support. Patient's spouse difficult to understand and had to be asked to repeat himself and speak slowly during the call. Patient's spouse made aware that this social worker was covering for Eula Fried today who will return to the office on 05/18/17 to follow up with any additional community resource needs.   Sheralyn Boatman [facility] Care Management [phone]

## 2017-05-17 NOTE — Patient Outreach (Signed)
[place] [facility]) Care Management  05/17/2017  Carmen [name] May 04, 1933 [id number]   2nd attempt made to introduce self as assigned care manager and follow up for weekly transition of care call.  Female answered phone (later identified as husband) but then places his physical therapist on phone.  He state he is unable to talk at this time due to therapy session and report member is unable to speak with this care manager and provide accurate information.  Request made for husband to call this care manager back when he is available.  Attempted to provide contact information but he report he will be able to return call from the caller ID.  Will await call back.  If no call back, will follow up within the next 3 business days.  Carmen [name], [place], MSN [place] [phone]

## 2017-05-17 NOTE — Patient Outreach (Signed)
[place] [facility]) Care Management  05/17/2017  Carmen [name] March 21, 1933 [id number]   Patient referral received by [facility] RNCM to assess for community resource needs in assigned social worker's Eula Fried absence. Patient did not answer, voicemail message left for a return call.  Sheralyn Boatman [facility] Care Management [phone]

## 2017-05-18 ENCOUNTER — Other Ambulatory Visit: Payer: Self-pay | Admitting: Licensed Clinical Social Worker

## 2017-05-18 ENCOUNTER — Other Ambulatory Visit: Payer: Self-pay | Admitting: *Deleted

## 2017-05-18 NOTE — Patient Outreach (Signed)
[facility]) Care Management  05/18/2017  Carmen [name] 24-Jul-1933 [id number]  Assessment-CSW completed outreach attempt today after returning back to the office and receiving new referral. CSW unable to reach patient successfully. CSW left a HIPPA compliant voice message encouraging patient to return call once available.  Plan-CSW will await return call or complete an additional outreach if needed.  Eula Fried, BSW, MSW, [email or web address] Phone: [phone] Fax: [phone]

## 2017-05-18 NOTE — Patient Outreach (Signed)
[facility] [place] [facility]) Care Management  05/18/2017  Carmen [name] 1933-07-21 [id number]   No call back from member's husband yesterday. Call placed again today in attempt to follow up on member's condition and schedule initial home visit.  Unsuccessful, HIPAA compliant voice message left.  Will await call back.  This makes 3rd attempt to engage member/family without success.  Unsuccessful outreach letter sent to the home.  Will wait 10 business days for response.  If no response, will make final outreach attempt.  If remain unsuccessful, will close case at that time.  Valente David, [place], MSN [place] [phone]

## 2017-05-19 ENCOUNTER — Other Ambulatory Visit: Payer: Self-pay | Admitting: Licensed Clinical Social Worker

## 2017-05-19 NOTE — Patient Outreach (Signed)
Duplicate encounter created in error.  Eula Fried, BSW, MSW, [email or web address] Phone: [phone] Fax: [phone]

## 2017-05-19 NOTE — Patient Outreach (Signed)
[place] [facility]) Care Management  05/19/2017  PRIMA RAYNER [birth year] [id number]  Assessment-CSW completed second outreach attempt today. CSW unable to reach patient successfully. CSW left a HIPPA compliant voice message encouraging patient to return call once available. [facility] RNCM has already mailed unsuccessful outreach letter on 05/18/17. CSW will wait 10 business days to complete third and final outreach attempt before closing case.   Eula Fried, BSW, MSW, [email or web address] Phone: [phone] Fax: [phone]

## 2017-05-20 DIAGNOSIS — N08 Glomerular disorders in diseases classified elsewhere: Secondary | ICD-10-CM | POA: Diagnosis not present

## 2017-05-20 DIAGNOSIS — E559 Vitamin D deficiency, unspecified: Secondary | ICD-10-CM | POA: Diagnosis not present

## 2017-05-20 DIAGNOSIS — E1122 Type 2 diabetes mellitus with diabetic chronic kidney disease: Secondary | ICD-10-CM | POA: Diagnosis not present

## 2017-05-20 DIAGNOSIS — I129 Hypertensive chronic kidney disease with stage 1 through stage 4 chronic kidney disease, or unspecified chronic kidney disease: Secondary | ICD-10-CM | POA: Diagnosis not present

## 2017-05-20 DIAGNOSIS — J111 Influenza due to unidentified influenza virus with other respiratory manifestations: Secondary | ICD-10-CM | POA: Diagnosis not present

## 2017-05-20 DIAGNOSIS — N184 Chronic kidney disease, stage 4 (severe): Secondary | ICD-10-CM | POA: Diagnosis not present

## 2017-06-01 ENCOUNTER — Other Ambulatory Visit: Payer: Self-pay | Admitting: *Deleted

## 2017-06-01 ENCOUNTER — Other Ambulatory Visit: Payer: Self-pay | Admitting: Licensed Clinical Social Worker

## 2017-06-01 NOTE — Patient Outreach (Signed)
[place] [facility]) Care Management  06/01/2017  VIENO TARRANT [birth year] [id number]   3rd attempt made to engage member in [place] [facility] services.  Spoke with husband, he state that he has support from his children and grandchildren with managing member's care, state he is in need of assistance a few times a week with bathing and dressing.  State his daughter provide transportation to MD appointments, he manages medications with the exception of insulin.  Son administers daily insulin injections.  This care manager offered to perform home visit for assessment of needs, he state again he is only interested in having personal care assistance.    He is made aware that LCSW has been trying to contact him without success, advised to contact her as soon as possible.  Contact information provided.  LCSW updated on contact and request.  Will close to nursing at this time.  Valente David, [place], MSN [place] [phone]

## 2017-06-01 NOTE — Patient Outreach (Signed)
[place] [facility]) Care Management  06/01/2017  DEETTA SIEGMANN 06/23/1933 [id number]  Assessment- CSW received in basket message from [place] who stated that she was able to reach patient's spouse successfully today. Spouse denied any nursing needs but is interested in gaining social work assistance and Financial risk analyst. [facility] RNCM reported that spouse was eager to talk to [place] completed outreach call to patient's spouse but number went straight to voice mail box. CSW left another HIPPA compliant voice message encouraging a return call if still interested in [place] work assistance. CSW will complete fourth outreach attempt this week since [facility] RNCM was able to establish contact with family.  Plan-CSW will complete fourth outreach attempt by 06/03/17.  Eula Fried, BSW, MSW, [email or web address] Phone: [phone] Fax: [phone]

## 2017-06-03 ENCOUNTER — Other Ambulatory Visit: Payer: Self-pay | Admitting: Licensed Clinical Social Worker

## 2017-06-03 NOTE — Patient Outreach (Signed)
[place] [facility]) Care Management  06/03/2017  Carmen [name] Apr 27, 1933 [id number]  Assessment-CSW completed fourth and final outreach attempt today. CSW unable to reach patient successfully. CSW left a HIPPA compliant voice message encouraging patient to return call once available if still interested in [place] work assistance.   Plan-CSW will close case at this time and notify PCP.  Eula Fried, BSW, MSW, [email or web address] Phone: [phone] Fax: [phone]

## 2017-07-11 DIAGNOSIS — Z950 Presence of cardiac pacemaker: Secondary | ICD-10-CM | POA: Diagnosis not present

## 2017-07-11 DIAGNOSIS — Z45018 Encounter for adjustment and management of other part of cardiac pacemaker: Secondary | ICD-10-CM | POA: Diagnosis not present

## 2017-09-16 ENCOUNTER — Observation Stay (HOSPITAL_COMMUNITY): Payer: Medicare HMO

## 2017-09-16 ENCOUNTER — Encounter (HOSPITAL_COMMUNITY): Payer: Self-pay

## 2017-09-16 ENCOUNTER — Other Ambulatory Visit: Payer: Self-pay

## 2017-09-16 ENCOUNTER — Inpatient Hospital Stay (HOSPITAL_COMMUNITY)
Admission: EM | Admit: 2017-09-16 | Discharge: 2017-09-19 | DRG: 637 | Disposition: A | Discharge status: Home Health | Payer: Medicare HMO | Attending: Internal Medicine | Admitting: Internal Medicine

## 2017-09-16 DIAGNOSIS — Z888 Allergy status to other drugs, medicaments and biological substances status: Secondary | ICD-10-CM

## 2017-09-16 DIAGNOSIS — I2510 Atherosclerotic heart disease of native coronary artery without angina pectoris: Secondary | ICD-10-CM | POA: Diagnosis present

## 2017-09-16 DIAGNOSIS — E860 Dehydration: Secondary | ICD-10-CM | POA: Diagnosis present

## 2017-09-16 DIAGNOSIS — Z66 Do not resuscitate: Secondary | ICD-10-CM | POA: Diagnosis present

## 2017-09-16 DIAGNOSIS — N170 Acute kidney failure with tubular necrosis: Secondary | ICD-10-CM | POA: Diagnosis not present

## 2017-09-16 DIAGNOSIS — Z950 Presence of cardiac pacemaker: Secondary | ICD-10-CM | POA: Diagnosis not present

## 2017-09-16 DIAGNOSIS — N184 Chronic kidney disease, stage 4 (severe): Secondary | ICD-10-CM | POA: Diagnosis present

## 2017-09-16 DIAGNOSIS — N183 Chronic kidney disease, stage 3 (moderate): Secondary | ICD-10-CM | POA: Diagnosis not present

## 2017-09-16 DIAGNOSIS — R0609 Other forms of dyspnea: Secondary | ICD-10-CM | POA: Diagnosis not present

## 2017-09-16 DIAGNOSIS — E876 Hypokalemia: Secondary | ICD-10-CM | POA: Diagnosis present

## 2017-09-16 DIAGNOSIS — E559 Vitamin D deficiency, unspecified: Secondary | ICD-10-CM | POA: Diagnosis present

## 2017-09-16 DIAGNOSIS — M1990 Unspecified osteoarthritis, unspecified site: Secondary | ICD-10-CM | POA: Diagnosis present

## 2017-09-16 DIAGNOSIS — R197 Diarrhea, unspecified: Secondary | ICD-10-CM | POA: Diagnosis not present

## 2017-09-16 DIAGNOSIS — Z7982 Long term (current) use of aspirin: Secondary | ICD-10-CM

## 2017-09-16 DIAGNOSIS — E1140 Type 2 diabetes mellitus with diabetic neuropathy, unspecified: Secondary | ICD-10-CM | POA: Diagnosis present

## 2017-09-16 DIAGNOSIS — E872 Acidosis, unspecified: Secondary | ICD-10-CM | POA: Diagnosis present

## 2017-09-16 DIAGNOSIS — Z8673 Personal history of transient ischemic attack (TIA), and cerebral infarction without residual deficits: Secondary | ICD-10-CM | POA: Diagnosis not present

## 2017-09-16 DIAGNOSIS — Z452 Encounter for adjustment and management of vascular access device: Secondary | ICD-10-CM | POA: Diagnosis not present

## 2017-09-16 DIAGNOSIS — E1100 Type 2 diabetes mellitus with hyperosmolarity without nonketotic hyperglycemic-hyperosmolar coma (NKHHC): Secondary | ICD-10-CM | POA: Diagnosis not present

## 2017-09-16 DIAGNOSIS — N390 Urinary tract infection, site not specified: Secondary | ICD-10-CM | POA: Diagnosis present

## 2017-09-16 DIAGNOSIS — I495 Sick sinus syndrome: Secondary | ICD-10-CM | POA: Diagnosis present

## 2017-09-16 DIAGNOSIS — T502X5A Adverse effect of carbonic-anhydrase inhibitors, benzothiadiazides and other diuretics, initial encounter: Secondary | ICD-10-CM | POA: Diagnosis present

## 2017-09-16 DIAGNOSIS — E1110 Type 2 diabetes mellitus with ketoacidosis without coma: Secondary | ICD-10-CM

## 2017-09-16 DIAGNOSIS — Z91010 Allergy to peanuts: Secondary | ICD-10-CM | POA: Diagnosis not present

## 2017-09-16 DIAGNOSIS — R0989 Other specified symptoms and signs involving the circulatory and respiratory systems: Secondary | ICD-10-CM | POA: Diagnosis not present

## 2017-09-16 DIAGNOSIS — F419 Anxiety disorder, unspecified: Secondary | ICD-10-CM | POA: Diagnosis present

## 2017-09-16 DIAGNOSIS — Z7902 Long term (current) use of antithrombotics/antiplatelets: Secondary | ICD-10-CM

## 2017-09-16 DIAGNOSIS — Z87891 Personal history of nicotine dependence: Secondary | ICD-10-CM

## 2017-09-16 DIAGNOSIS — E43 Unspecified severe protein-calorie malnutrition: Secondary | ICD-10-CM | POA: Diagnosis not present

## 2017-09-16 DIAGNOSIS — E785 Hyperlipidemia, unspecified: Secondary | ICD-10-CM | POA: Diagnosis present

## 2017-09-16 DIAGNOSIS — Y92009 Unspecified place in unspecified non-institutional (private) residence as the place of occurrence of the external cause: Secondary | ICD-10-CM | POA: Diagnosis not present

## 2017-09-16 DIAGNOSIS — I69351 Hemiplegia and hemiparesis following cerebral infarction affecting right dominant side: Secondary | ICD-10-CM | POA: Diagnosis not present

## 2017-09-16 DIAGNOSIS — E1151 Type 2 diabetes mellitus with diabetic peripheral angiopathy without gangrene: Secondary | ICD-10-CM | POA: Diagnosis present

## 2017-09-16 DIAGNOSIS — R531 Weakness: Secondary | ICD-10-CM | POA: Diagnosis not present

## 2017-09-16 DIAGNOSIS — M545 Low back pain: Secondary | ICD-10-CM | POA: Diagnosis present

## 2017-09-16 DIAGNOSIS — K649 Unspecified hemorrhoids: Secondary | ICD-10-CM | POA: Diagnosis present

## 2017-09-16 DIAGNOSIS — I129 Hypertensive chronic kidney disease with stage 1 through stage 4 chronic kidney disease, or unspecified chronic kidney disease: Secondary | ICD-10-CM | POA: Diagnosis present

## 2017-09-16 DIAGNOSIS — Z90710 Acquired absence of both cervix and uterus: Secondary | ICD-10-CM

## 2017-09-16 DIAGNOSIS — R739 Hyperglycemia, unspecified: Secondary | ICD-10-CM

## 2017-09-16 DIAGNOSIS — G8929 Other chronic pain: Secondary | ICD-10-CM | POA: Diagnosis present

## 2017-09-16 DIAGNOSIS — E1122 Type 2 diabetes mellitus with diabetic chronic kidney disease: Secondary | ICD-10-CM | POA: Diagnosis present

## 2017-09-16 DIAGNOSIS — R8271 Bacteriuria: Secondary | ICD-10-CM | POA: Diagnosis present

## 2017-09-16 DIAGNOSIS — Z79899 Other long term (current) drug therapy: Secondary | ICD-10-CM

## 2017-09-16 DIAGNOSIS — Z800 Family history of malignant neoplasm of digestive organs: Secondary | ICD-10-CM

## 2017-09-16 DIAGNOSIS — H269 Unspecified cataract: Secondary | ICD-10-CM | POA: Diagnosis present

## 2017-09-16 DIAGNOSIS — R0902 Hypoxemia: Secondary | ICD-10-CM | POA: Diagnosis not present

## 2017-09-16 DIAGNOSIS — Z8249 Family history of ischemic heart disease and other diseases of the circulatory system: Secondary | ICD-10-CM

## 2017-09-16 DIAGNOSIS — N189 Chronic kidney disease, unspecified: Secondary | ICD-10-CM

## 2017-09-16 DIAGNOSIS — N179 Acute kidney failure, unspecified: Secondary | ICD-10-CM | POA: Diagnosis present

## 2017-09-16 DIAGNOSIS — R1111 Vomiting without nausea: Secondary | ICD-10-CM | POA: Diagnosis not present

## 2017-09-16 DIAGNOSIS — E1165 Type 2 diabetes mellitus with hyperglycemia: Secondary | ICD-10-CM | POA: Diagnosis present

## 2017-09-16 DIAGNOSIS — Z794 Long term (current) use of insulin: Secondary | ICD-10-CM

## 2017-09-16 HISTORY — PX: IR US GUIDE VASC ACCESS RIGHT: IMG2390

## 2017-09-16 HISTORY — PX: IR FLUORO GUIDE CV LINE RIGHT: IMG2283

## 2017-09-16 LAB — GLUCOSE, CAPILLARY
GLUCOSE-CAPILLARY: 423 mg/dL — AB (ref 70–99)
GLUCOSE-CAPILLARY: 190 mg/dL — AB (ref 70–99)
GLUCOSE-CAPILLARY: 155 mg/dL — AB (ref 70–99)
GLUCOSE-CAPILLARY: 105 mg/dL — AB (ref 70–99)
GLUCOSE-CAPILLARY: 86 mg/dL (ref 70–99)
Glucose-Capillary: 276 mg/dL — ABNORMAL HIGH (ref 70–99)
Glucose-Capillary: 274 mg/dL — ABNORMAL HIGH (ref 70–99)
Glucose-Capillary: 235 mg/dL — ABNORMAL HIGH (ref 70–99)
Glucose-Capillary: 105 mg/dL — ABNORMAL HIGH (ref 70–99)
Glucose-Capillary: 110 mg/dL — ABNORMAL HIGH (ref 70–99)
Glucose-Capillary: 127 mg/dL — ABNORMAL HIGH (ref 70–99)

## 2017-09-16 LAB — BASIC METABOLIC PANEL
ANION GAP: 17 — AB (ref 5–15)
ANION GAP: 16 — AB (ref 5–15)
Anion gap: 14 (ref 5–15)
Anion gap: 15 (ref 5–15)
Anion gap: 10 (ref 5–15)
Anion gap: 11 (ref 5–15)
BUN: 46 mg/dL — ABNORMAL HIGH (ref 8–23)
BUN: 45 mg/dL — ABNORMAL HIGH (ref 8–23)
BUN: 46 mg/dL — ABNORMAL HIGH (ref 8–23)
BUN: 42 mg/dL — AB (ref 8–23)
BUN: 43 mg/dL — AB (ref 8–23)
BUN: 42 mg/dL — ABNORMAL HIGH (ref 8–23)
CALCIUM: 7.2 mg/dL — AB (ref 8.9–10.3)
CALCIUM: 7.1 mg/dL — AB (ref 8.9–10.3)
CHLORIDE: 86 mmol/L — AB (ref 98–111)
CHLORIDE: 93 mmol/L — AB (ref 98–111)
CHLORIDE: 99 mmol/L (ref 98–111)
CHLORIDE: 99 mmol/L (ref 98–111)
CO2: 28 mmol/L (ref 22–32)
CO2: 29 mmol/L (ref 22–32)
CO2: 29 mmol/L (ref 22–32)
CO2: 29 mmol/L (ref 22–32)
CO2: 29 mmol/L (ref 22–32)
CO2: 29 mmol/L (ref 22–32)
CREATININE: 3.18 mg/dL — AB (ref 0.44–1.00)
CREATININE: 2.96 mg/dL — AB (ref 0.44–1.00)
Calcium: 6.9 mg/dL — ABNORMAL LOW (ref 8.9–10.3)
Calcium: 6.8 mg/dL — ABNORMAL LOW (ref 8.9–10.3)
Calcium: 6.9 mg/dL — ABNORMAL LOW (ref 8.9–10.3)
Calcium: 7 mg/dL — ABNORMAL LOW (ref 8.9–10.3)
Chloride: 88 mmol/L — ABNORMAL LOW (ref 98–111)
Chloride: 92 mmol/L — ABNORMAL LOW (ref 98–111)
Creatinine, Ser: 3.26 mg/dL — ABNORMAL HIGH (ref 0.44–1.00)
Creatinine, Ser: 3.14 mg/dL — ABNORMAL HIGH (ref 0.44–1.00)
Creatinine, Ser: 2.68 mg/dL — ABNORMAL HIGH (ref 0.44–1.00)
Creatinine, Ser: 2.56 mg/dL — ABNORMAL HIGH (ref 0.44–1.00)
GFR calc Af Amer: 16 mL/min — ABNORMAL LOW (ref >60)
GFR calc Af Amer: 18 mL/min — ABNORMAL LOW (ref >60)
GFR calc non Af Amer: 12 mL/min — ABNORMAL LOW (ref >60)
GFR calc non Af Amer: 13 mL/min — ABNORMAL LOW (ref >60)
GFR calc non Af Amer: 14 mL/min — ABNORMAL LOW (ref >60)
GFR calc non Af Amer: 16 mL/min — ABNORMAL LOW (ref >60)
GFR, EST AFRICAN AMERICAN: 14 mL/min — AB (ref >60)
GFR, EST AFRICAN AMERICAN: 15 mL/min — AB (ref >60)
GFR, EST AFRICAN AMERICAN: 15 mL/min — AB (ref >60)
GFR, EST AFRICAN AMERICAN: 19 mL/min — AB (ref >60)
GFR, EST NON AFRICAN AMERICAN: 13 mL/min — AB (ref >60)
GFR, EST NON AFRICAN AMERICAN: 16 mL/min — AB (ref >60)
GLUCOSE: 504 mg/dL — AB (ref 70–99)
Glucose, Bld: 575 mg/dL (ref 70–99)
Glucose, Bld: 472 mg/dL — ABNORMAL HIGH (ref 70–99)
Glucose, Bld: 339 mg/dL — ABNORMAL HIGH (ref 70–99)
Glucose, Bld: 131 mg/dL — ABNORMAL HIGH (ref 70–99)
Glucose, Bld: 163 mg/dL — ABNORMAL HIGH (ref 70–99)
POTASSIUM: 3.6 mmol/L (ref 3.5–5.1)
POTASSIUM: 3 mmol/L — AB (ref 3.5–5.1)
Potassium: 3 mmol/L — ABNORMAL LOW (ref 3.5–5.1)
Potassium: 3 mmol/L — ABNORMAL LOW (ref 3.5–5.1)
Potassium: 2.7 mmol/L — CL (ref 3.5–5.1)
SODIUM: 131 mmol/L — AB (ref 135–145)
SODIUM: 135 mmol/L (ref 135–145)
Sodium: 133 mmol/L — ABNORMAL LOW (ref 135–145)
Sodium: 137 mmol/L (ref 135–145)
Sodium: 138 mmol/L (ref 135–145)
Sodium: 139 mmol/L (ref 135–145)

## 2017-09-16 LAB — MRSA PCR SCREENING: MRSA by PCR: NEGATIVE

## 2017-09-16 LAB — CBC WITH DIFFERENTIAL/PLATELET
Basophils Absolute: 0 10*3/uL (ref 0.0–0.1)
Basophils Relative: 0 %
EOS ABS: 0 10*3/uL (ref 0.0–0.7)
Eosinophils Relative: 1 %
HCT: 38.1 % (ref 36.0–46.0)
Hemoglobin: 13.7 g/dL (ref 12.0–15.0)
LYMPHS ABS: 0.9 10*3/uL (ref 0.7–4.0)
Lymphocytes Relative: 16 %
MCH: 32.1 pg (ref 26.0–34.0)
MCHC: 36 g/dL (ref 30.0–36.0)
MCV: 89.2 fL (ref 78.0–100.0)
MONO ABS: 0.3 10*3/uL (ref 0.1–1.0)
MONOS PCT: 5 %
Neutro Abs: 4.5 10*3/uL (ref 1.7–7.7)
Neutrophils Relative %: 78 %
PLATELETS: 183 10*3/uL (ref 150–400)
RBC: 4.27 MIL/uL (ref 3.87–5.11)
RDW: 12.7 % (ref 11.5–15.5)
WBC: 5.7 10*3/uL (ref 4.0–10.5)

## 2017-09-16 LAB — URINALYSIS, ROUTINE W REFLEX MICROSCOPIC
BILIRUBIN URINE: NEGATIVE
Glucose, UA: >=500 mg/dL — AB
KETONES UR: NEGATIVE mg/dL
NITRITE: POSITIVE — AB
PROTEIN: 100 mg/dL — AB
Specific Gravity, Urine: 1.011 (ref 1.005–1.030)
pH: 5 (ref 5.0–8.0)

## 2017-09-16 LAB — MAGNESIUM: Magnesium: 1.7 mg/dL (ref 1.7–2.4)

## 2017-09-16 LAB — HEPATIC FUNCTION PANEL
ALT: 13 U/L (ref 0–44)
AST: 17 U/L (ref 15–41)
Albumin: 2.1 g/dL — ABNORMAL LOW (ref 3.5–5.0)
Alkaline Phosphatase: 107 U/L (ref 38–126)
BILIRUBIN DIRECT: 0.1 mg/dL (ref 0.0–0.2)
BILIRUBIN TOTAL: 0.4 mg/dL (ref 0.3–1.2)
Indirect Bilirubin: 0.3 mg/dL (ref 0.3–0.9)
Total Protein: 5.6 g/dL — ABNORMAL LOW (ref 6.5–8.1)

## 2017-09-16 LAB — TROPONIN I
TROPONIN I: 0.09 ng/mL — AB (ref <0.03)
TROPONIN I: 0.08 ng/mL — AB (ref <0.03)
TROPONIN I: 0.07 ng/mL — AB (ref <0.03)
TROPONIN I: 0.06 ng/mL — AB (ref <0.03)

## 2017-09-16 LAB — CBG MONITORING, ED: GLUCOSE-CAPILLARY: 531 mg/dL — AB (ref 70–99)

## 2017-09-16 LAB — SODIUM, URINE, RANDOM: Sodium, Ur: 79 mmol/L

## 2017-09-16 LAB — CREATININE, URINE, RANDOM: CREATININE, URINE: 39.62 mg/dL

## 2017-09-16 LAB — BETA-HYDROXYBUTYRIC ACID: Beta-Hydroxybutyric Acid: 0.06 mmol/L (ref 0.05–0.27)

## 2017-09-16 MED ORDER — POTASSIUM CHLORIDE 20 MEQ/15ML (10%) PO SOLN
60.0000 meq | ORAL | Status: AC
  Administered 2017-09-16: 60 meq via ORAL
  Filled 2017-09-16: qty 45

## 2017-09-16 MED ORDER — LIDOCAINE HCL 1 % IJ SOLN
INTRAMUSCULAR | Status: AC
  Filled 2017-09-16: qty 20

## 2017-09-16 MED ORDER — SODIUM CHLORIDE 0.9 % IV SOLN
INTRAVENOUS | Status: DC
  Administered 2017-09-16: 1.8 [IU]/h via INTRAVENOUS
  Filled 2017-09-16 (×2): qty 1

## 2017-09-16 MED ORDER — INSULIN ASPART 100 UNIT/ML ~~LOC~~ SOLN: 0.0000 [IU] | Freq: Three times a day (TID) | SUBCUTANEOUS | Status: DC

## 2017-09-16 MED ORDER — SODIUM CHLORIDE 0.9% FLUSH
10.0000 mL | Freq: Two times a day (BID) | INTRAVENOUS | Status: DC
  Administered 2017-09-17 – 2017-09-19 (×5): 10 mL

## 2017-09-16 MED ORDER — ISOSORBIDE MONONITRATE ER 60 MG PO TB24
120.0000 mg | ORAL_TABLET | Freq: Every day | ORAL | Status: DC
  Administered 2017-09-16 – 2017-09-19 (×4): 120 mg via ORAL
  Filled 2017-09-16 (×5): qty 2

## 2017-09-16 MED ORDER — DEXTROSE-NACL 5-0.45 % IV SOLN
INTRAVENOUS | Status: DC
  Administered 2017-09-16: 15:00:00 via INTRAVENOUS

## 2017-09-16 MED ORDER — POTASSIUM CHLORIDE 20 MEQ PO PACK
40.0000 meq | PACK | Freq: Once | ORAL | Status: AC
  Administered 2017-09-16: 40 meq via ORAL
  Filled 2017-09-16: qty 2

## 2017-09-16 MED ORDER — HEPARIN SODIUM (PORCINE) 5000 UNIT/ML IJ SOLN
5000.0000 [IU] | Freq: Three times a day (TID) | INTRAMUSCULAR | Status: DC
  Administered 2017-09-16 – 2017-09-19 (×9): 5000 [IU] via SUBCUTANEOUS
  Filled 2017-09-16 (×10): qty 1

## 2017-09-16 MED ORDER — METOPROLOL TARTRATE 25 MG PO TABS
25.0000 mg | ORAL_TABLET | Freq: Two times a day (BID) | ORAL | Status: DC
  Administered 2017-09-16 – 2017-09-19 (×7): 25 mg via ORAL
  Filled 2017-09-16 (×7): qty 1

## 2017-09-16 MED ORDER — OMEGA-3-ACID ETHYL ESTERS 1 G PO CAPS
1.0000 g | ORAL_CAPSULE | Freq: Every day | ORAL | Status: DC
  Administered 2017-09-16 – 2017-09-19 (×4): 1 g via ORAL
  Filled 2017-09-16 (×4): qty 1

## 2017-09-16 MED ORDER — POTASSIUM CHLORIDE 10 MEQ/50ML IV SOLN
10.0000 meq | Freq: Once | INTRAVENOUS | Status: AC
  Administered 2017-09-16: 10 meq via INTRAVENOUS
  Filled 2017-09-16: qty 50

## 2017-09-16 MED ORDER — HYDRALAZINE HCL 10 MG PO TABS
10.0000 mg | ORAL_TABLET | Freq: Three times a day (TID) | ORAL | Status: DC
  Administered 2017-09-16 – 2017-09-19 (×10): 10 mg via ORAL
  Filled 2017-09-16 (×12): qty 1

## 2017-09-16 MED ORDER — GLUCERNA SHAKE PO LIQD
237.0000 mL | Freq: Three times a day (TID) | ORAL | Status: DC
  Administered 2017-09-17 – 2017-09-19 (×6): 237 mL via ORAL
  Filled 2017-09-16 (×14): qty 237

## 2017-09-16 MED ORDER — LIDOCAINE HCL 1 % IJ SOLN
INTRAMUSCULAR | Status: DC | PRN
  Administered 2017-09-16: 10 mL

## 2017-09-16 MED ORDER — SODIUM CHLORIDE 0.9% FLUSH: 10.0000 mL | INTRAVENOUS | Status: DC | PRN

## 2017-09-16 MED ORDER — INSULIN GLARGINE 100 UNIT/ML ~~LOC~~ SOLN
10.0000 [IU] | Freq: Every day | SUBCUTANEOUS | Status: DC
  Administered 2017-09-16: 10 [IU] via SUBCUTANEOUS
  Filled 2017-09-16 (×2): qty 0.1

## 2017-09-16 MED ORDER — CLOPIDOGREL BISULFATE 75 MG PO TABS
75.0000 mg | ORAL_TABLET | Freq: Every day | ORAL | Status: DC
  Administered 2017-09-16 – 2017-09-18 (×3): 75 mg via ORAL
  Filled 2017-09-16 (×4): qty 1

## 2017-09-16 MED ORDER — SODIUM CHLORIDE 0.9 % IV BOLUS
500.0000 mL | Freq: Once | INTRAVENOUS | Status: AC
  Administered 2017-09-16: 500 mL via INTRAVENOUS

## 2017-09-16 MED ORDER — POTASSIUM CHLORIDE 10 MEQ/100ML IV SOLN
10.0000 meq | INTRAVENOUS | Status: DC
  Administered 2017-09-16: 10 meq via INTRAVENOUS
  Filled 2017-09-16: qty 100

## 2017-09-16 MED ORDER — VITAMIN D (ERGOCALCIFEROL) 1.25 MG (50000 UNIT) PO CAPS
50000.0000 [IU] | ORAL_CAPSULE | ORAL | Status: DC
  Administered 2017-09-16: 50000 [IU] via ORAL
  Filled 2017-09-16: qty 1

## 2017-09-16 MED ORDER — ENSURE ENLIVE PO LIQD: 237.0000 mL | Freq: Two times a day (BID) | ORAL | Status: DC

## 2017-09-16 MED ORDER — ACETAMINOPHEN-CODEINE 120-12 MG/5ML PO SOLN
2.5000 mL | Freq: Four times a day (QID) | ORAL | Status: DC | PRN
  Administered 2017-09-16: 2.5 mL via ORAL
  Filled 2017-09-16 (×2): qty 5

## 2017-09-16 MED ORDER — SODIUM CHLORIDE 0.9 % IV BOLUS
250.0000 mL | Freq: Once | INTRAVENOUS | Status: AC
  Administered 2017-09-16: 250 mL via INTRAVENOUS

## 2017-09-16 MED ORDER — SODIUM CHLORIDE 0.9 % IV SOLN
1.0000 g | INTRAVENOUS | Status: DC
  Administered 2017-09-16 – 2017-09-18 (×3): 1 g via INTRAVENOUS
  Filled 2017-09-16 (×3): qty 1

## 2017-09-16 MED ORDER — ASPIRIN EC 81 MG PO TBEC
81.0000 mg | DELAYED_RELEASE_TABLET | Freq: Every day | ORAL | Status: DC
  Administered 2017-09-16 – 2017-09-19 (×4): 81 mg via ORAL
  Filled 2017-09-16 (×4): qty 1

## 2017-09-16 MED ORDER — CHLORHEXIDINE GLUCONATE CLOTH 2 % EX PADS
6.0000 | MEDICATED_PAD | Freq: Every day | CUTANEOUS | Status: DC
  Administered 2017-09-17 – 2017-09-19 (×2): 6 via TOPICAL

## 2017-09-16 MED ORDER — POTASSIUM CHLORIDE 10 MEQ/100ML IV SOLN
10.0000 meq | INTRAVENOUS | Status: AC
  Administered 2017-09-16: 10 meq via INTRAVENOUS
  Filled 2017-09-16: qty 100

## 2017-09-16 MED ORDER — ACETAMINOPHEN 650 MG RE SUPP: 650.0000 mg | Freq: Four times a day (QID) | RECTAL | Status: DC | PRN

## 2017-09-16 MED ORDER — POTASSIUM CHLORIDE 20 MEQ/15ML (10%) PO SOLN
40.0000 meq | Freq: Once | ORAL | Status: AC
  Administered 2017-09-16: 40 meq via ORAL
  Filled 2017-09-16: qty 30

## 2017-09-16 MED ORDER — POTASSIUM CHLORIDE 10 MEQ/50ML IV SOLN
10.0000 meq | INTRAVENOUS | Status: DC
  Filled 2017-09-16 (×2): qty 50

## 2017-09-16 MED ORDER — SODIUM CHLORIDE 0.9 % IV SOLN
INTRAVENOUS | Status: DC
  Administered 2017-09-16 – 2017-09-19 (×5): via INTRAVENOUS

## 2017-09-16 MED ORDER — ACETAMINOPHEN 325 MG PO TABS
650.0000 mg | ORAL_TABLET | Freq: Four times a day (QID) | ORAL | Status: DC | PRN
  Administered 2017-09-17 – 2017-09-19 (×4): 650 mg via ORAL
  Filled 2017-09-16 (×4): qty 2

## 2017-09-16 MED ORDER — INSULIN ASPART 100 UNIT/ML ~~LOC~~ SOLN
10.0000 [IU] | Freq: Once | SUBCUTANEOUS | Status: DC
  Filled 2017-09-16: qty 1

## 2017-09-16 MED ORDER — CALCIUM CARBONATE-VITAMIN D 500-200 MG-UNIT PO TABS
1.0000 | ORAL_TABLET | Freq: Four times a day (QID) | ORAL | Status: DC
  Administered 2017-09-16 – 2017-09-19 (×13): 1 via ORAL
  Filled 2017-09-16 (×13): qty 1

## 2017-09-16 MED ORDER — ONDANSETRON HCL 4 MG/2ML IJ SOLN
4.0000 mg | Freq: Four times a day (QID) | INTRAMUSCULAR | Status: DC | PRN
  Filled 2017-09-16: qty 2

## 2017-09-16 MED ORDER — INSULIN ASPART 100 UNIT/ML ~~LOC~~ SOLN
0.0000 [IU] | SUBCUTANEOUS | Status: DC
  Administered 2017-09-17: 5 [IU] via SUBCUTANEOUS
  Administered 2017-09-17: 3 [IU] via SUBCUTANEOUS
  Administered 2017-09-17 – 2017-09-18 (×3): 5 [IU] via SUBCUTANEOUS
  Administered 2017-09-18: 2 [IU] via SUBCUTANEOUS
  Administered 2017-09-18 – 2017-09-19 (×2): 3 [IU] via SUBCUTANEOUS
  Administered 2017-09-19: 2 [IU] via SUBCUTANEOUS

## 2017-09-16 MED ORDER — MAGNESIUM SULFATE 2 GM/50ML IV SOLN
2.0000 g | Freq: Once | INTRAVENOUS | Status: AC
Start: 2017-09-16 — End: 2017-09-16
  Administered 2017-09-16: 2 g via INTRAVENOUS
  Filled 2017-09-16: qty 50

## 2017-09-16 MED ORDER — ONDANSETRON HCL 4 MG PO TABS: 4.0000 mg | ORAL_TABLET | Freq: Four times a day (QID) | ORAL | Status: DC | PRN

## 2017-09-16 MED ORDER — ORAL CARE MOUTH RINSE
15.0000 mL | Freq: Two times a day (BID) | OROMUCOSAL | Status: DC
Start: 2017-09-16 — End: 2017-09-19
  Administered 2017-09-16 – 2017-09-19 (×4): 15 mL via OROMUCOSAL

## 2017-09-16 MED ORDER — POTASSIUM CHLORIDE CRYS ER 20 MEQ PO TBCR
40.0000 meq | EXTENDED_RELEASE_TABLET | ORAL | Status: AC
  Administered 2017-09-16 (×2): 40 meq via ORAL
  Filled 2017-09-16 (×2): qty 2

## 2017-09-16 MED ORDER — HEPARIN SODIUM (PORCINE) 5000 UNIT/ML IJ SOLN
5000.0000 [IU] | Freq: Three times a day (TID) | INTRAMUSCULAR | Status: DC
  Administered 2017-09-16: 5000 [IU] via SUBCUTANEOUS
  Filled 2017-09-16: qty 1

## 2017-09-16 NOTE — ED Triage Notes (Signed)
Pt BIB from home. Per PTAR, family states that she did not eat very much yesterday and has been lethargic. Hx of diabetes. Family also reports loose stool today. Denies N/V. A&Ox2. Responds to voice.

## 2017-09-16 NOTE — ED Provider Notes (Signed)
[place] DEPT Provider Note: Georgena Spurling, MD, FACEP  CSN: [id number] MRN: [id number] ARRIVAL: 09/16/17 at [facility]   HISTORY OF PRESENT ILLNESS  09/16/17 12:55 AM Carmen [name] is a 84 y.o. female with a 2-day history of weakness and decreased energy.  She has been primarily bedbound for the past 2 days.  She has been able to ambulate with a cane to the bathroom.  She has not had a fever or vomiting but has had loose stools for the past day and a half.  She is denying pain to me but her daughter states that her generalized arthritis pain is worse than usual.  Her sugar was 125 the day before yesterday but increased to 449 yesterday evening despite decreased oral intake.  She has had little appetite but has been able to drink Ensure.  Her daughter thinks she may have some baseline dementia; she is oriented to person and place.   Past Medical History:  Diagnosis Date  . Anxiety   . Arthritis    "everywhere"  . Cataract    "both eyes; blood pressure's always too high to have them fixed" (09/11/2014)  . Chronic lower back pain   . Claudication in peripheral vascular disease ([place])    02/16/10: Left CIA 9.0x28 Omnilink and REIA 8.0x40 seff expanding Zilver.   right subclavian artery stent 03/18/2008,  . Coronary artery disease   . Herniated lumbar intervertebral disc   . Hypertension   . IDDM (insulin dependent diabetes mellitus) ([place])   . Migraine    "used to have terrible migraines; stopped in the 1990's"  . Pacemaker   . Peripheral vascular disease ([place])   . Pneumonia X 2  . Renal disorder   . Renovascular hypertension     s/p left renal artery stent 12/2007.  S/P balloon angioplasty on 02/16/10 for ISR, BP was  controlled well since then.     . Stroke [facility]) 1999   Stroke and TIA in 1999 with right sided weakness, now with residual right arm weakness (09/11/2014)  . UTI (lower urinary tract infection) 02/2015    Past Surgical  History:  Procedure Laterality Date  . ABDOMINAL ANGIOGRAM N/A 07/06/2011   Procedure: ABDOMINAL ANGIOGRAM;  Surgeon: Laverda Page, MD;  Location: [facility] CATH LAB;  Service: Cardiovascular;  Laterality: N/A;  . APPENDECTOMY    . BACK SURGERY    . CAROTID ENDARTERECTOMY Left 1991    Stroke and TIA in 1999 with right sided weakness, now with residual right arm weakness  . CARPAL TUNNEL RELEASE Left   . COLONOSCOPY  07/30/2011   Procedure: COLONOSCOPY;  Surgeon: Inda Castle, MD;  Location: [place];  Service: Endoscopy;  Laterality: N/A;  gi bleed  . ESOPHAGOGASTRODUODENOSCOPY  07/30/2011   Procedure: ESOPHAGOGASTRODUODENOSCOPY (EGD);  Surgeon: Inda Castle, MD;  Location: [place];  Service: Endoscopy;  Laterality: N/A;  . EXCISIONAL HEMORRHOIDECTOMY    . INSERT / REPLACE / REMOVE PACEMAKER    . IR FLUORO GUIDE CV LINE RIGHT  07/26/2016  . IR US GUIDE VASC ACCESS RIGHT  07/26/2016  . LOWER EXTREMITY ANGIOGRAM Right 05/29/2012   Procedure: LOWER EXTREMITY ANGIOGRAM;  Surgeon: Laverda Page, MD;  Location: [facility] CATH LAB;  Service: Cardiovascular;  Laterality: Right;  . LUMBAR LAMINECTOMY     'herniated disc"  . PERIPHERAL VASCULAR CATHETERIZATION N/A 11/05/2014   Procedure: Abdominal Aortogram;  Surgeon: Serafina Mitchell, MD;  Location: [place] CV  LAB;  Service: Cardiovascular;  Laterality: N/A;  . PERMANENT PACEMAKER INSERTION Left 06/28/2011   Procedure: PERMANENT PACEMAKER INSERTION;  Surgeon: Deboraha Sprang, MD;  Location: [facility] CATH LAB;  Service: Cardiovascular;  Laterality: Left;  . RENAL ANGIOGRAM N/A 07/06/2011   Procedure: RENAL ANGIOGRAM;  Surgeon: Laverda Page, MD;  Location: [facility] CATH LAB;  Service: Cardiovascular;  Laterality: N/A;  . TONSILLECTOMY    . URETERAL STENT PLACEMENT    . VAGINAL HYSTERECTOMY      Family History  Problem Relation Age of Onset  . Cancer Father        Colon cancer  . Heart attack Mother     Social History   Tobacco Use  .  Smoking status: Former Smoker    Packs/day: 0.00    Years: 70.00    Pack years: 0.00    Types: Cigarettes  . Smokeless tobacco: Never Used  . Tobacco comment: 09/11/2014 "I used to smoke very heavy; down to 1 cigarette/day"  Substance Use Topics  . Alcohol use: No    Comment: "stopped drinking back in the 1970's"  . Drug use: No    Prior to Admission medications   Medication Sig Start Date End Date Taking? Authorizing Provider  acetaminophen (TYLENOL) 325 MG tablet Take 2 tablets (650 mg total) by mouth every 4 (four) hours as needed for headache or mild pain. 01/15/16  Yes Regalado, Belkys A, MD  aspirin EC 81 MG tablet Take 81 mg by mouth daily.   Yes [provider]  clopidogrel (PLAVIX) 75 MG tablet Take 75 mg by mouth daily.   Yes [provider]  diclofenac sodium (VOLTAREN) 1 % GEL Apply 2 g topically 4 (four) times daily.  04/28/17  Yes [provider]  furosemide (LASIX) 20 MG tablet Take 1 tablet (20 mg total) by mouth daily. Resume at lower dose in 2 days. Patient taking differently: Take 20 mg by mouth 2 (two) times daily.  07/29/16  Yes Eugenie Filler, MD  hydrALAZINE (APRESOLINE) 10 MG tablet Take 1 tablet (10 mg total) by mouth 3 (three) times daily. 05/10/17  Yes Hosie Poisson, MD  insulin NPH-regular Human (NOVOLIN 70/30) (70-30) 100 UNIT/ML injection Inject 5 Units into the skin 2 (two) times daily. 10/30/15  Yes Short, Noah Delaine, MD  isosorbide mononitrate (IMDUR) 120 MG 24 hr tablet Take 1 tablet (120 mg total) by mouth daily. Resume in 2 days. 05/10/17  Yes Hosie Poisson, MD  metoprolol tartrate (LOPRESSOR) 25 MG tablet Take 25 mg by mouth 2 (two) times daily.   Yes [provider]  Omega-3 Fatty Acids (FISH OIL) 1000 MG CAPS Take 1,000 mg by mouth 2 (two) times daily.    Yes [provider]  potassium chloride SA (K-DUR,KLOR-CON) 20 MEQ tablet Take 1 tablet (20 mEq total) by mouth daily. 07/29/16  Yes Eugenie Filler, MD    Vitamin D, Ergocalciferol, (DRISDOL) 50000 units CAPS capsule Take 1 capsule (50,000 Units total) by mouth every 7 (seven) days. 05/17/17  Yes Hosie Poisson, MD  calcium-vitamin D (OSCAL WITH D) 500-200 MG-UNIT tablet Take 2 tablets by mouth 4 (four) times daily. Patient not taking: Reported on 09/16/2017 07/27/16   Eugenie Filler, MD  docusate sodium (COLACE) 100 MG capsule Take 1 capsule (100 mg total) by mouth daily. Patient not taking: Reported on 05/07/2017 04/16/17   Montine Circle, PA-C  oseltamivir (TAMIFLU) 30 MG capsule Take 1 capsule (30 mg total) by mouth daily. 05/11/17  Hosie Poisson, MD  aliskiren (TEKTURNA) 150 MG tablet Take 150 mg by mouth daily.  05/29/11  [provider]  gabapentin (NEURONTIN) 100 MG capsule Take 1 capsule (100 mg total) by mouth 3 (three) times daily. 04/06/11 05/29/11  Nita Sells, MD    Allergies Norvasc [amlodipine besylate] and Peanut-containing drug products   REVIEW OF SYSTEMS  Negative except as noted here or in the History of Present Illness.   PHYSICAL EXAMINATION  Initial Vital Signs Blood pressure (!) 128/50, pulse (!) 58, temperature 97.9 F (36.6 C), resp. rate 16, SpO2 98 %.  Examination General: Well-developed, cachectic female in no acute distress; appearance consistent with age of record HENT: normocephalic; atraumatic Eyes: pupils equal, round and sluggish; extraocular muscles intact; arcus senilis bilaterally Neck: supple Heart: regular rate and rhythm Lungs: clear to auscultation bilaterally Abdomen: soft; nondistended; nontender; no masses or hepatosplenomegaly; bowel sounds present Extremities: No deformity; contractures of right upper extremity; pulses normal Neurologic: Awake, alert and oriented x 2; right hemiparesis skin: Warm and dry Psychiatric: Normal mood and affect   RESULTS  Summary of this visit's results, reviewed by myself:   EKG Interpretation  Date/Time:  Friday September 16 2017 02:24:22  EDT Ventricular Rate:  69 PR Interval:    QRS Duration: 102 QT Interval:  428 QTC Calculation: 445 R Axis:   23 Text Interpretation:  Atrial-paced complexes Ventricular premature complex Repol abnrm suggests ischemia, anterolateral Previously NSR Confirmed by Catalaya Garr, Jenny Reichmann [phone]) on 09/16/2017 2:26:51 AM      Laboratory Studies: Results for orders placed or performed during the hospital encounter of 09/16/17 (from the past 24 hour(s))  Basic metabolic panel     Status: Abnormal   Collection Time: 09/16/17  1:13 AM  Result Value Ref Range   Sodium 131 (L) 135 - 145 mmol/L   Potassium <2.0 (LL) 3.5 - 5.1 mmol/L   Chloride 86 (L) 98 - 111 mmol/L   CO2 28 22 - 32 mmol/L   Glucose, Bld 575 (HH) 70 - 99 mg/dL   BUN 46 (H) 8 - 23 mg/dL   Creatinine, Ser 3.26 (H) 0.44 - 1.00 mg/dL   Calcium 6.9 (L) 8.9 - 10.3 mg/dL   GFR calc non Af Amer 12 (L) >60 mL/min   GFR calc Af Amer 14 (L) >60 mL/min   Anion gap 17 (H) 5 - 15  CBC with Differential/Platelet     Status: None   Collection Time: 09/16/17  1:13 AM  Result Value Ref Range   WBC 5.7 4.0 - 10.5 K/uL   RBC 4.27 3.87 - 5.11 MIL/uL   Hemoglobin 13.7 12.0 - 15.0 g/dL   HCT 38.1 36.0 - 46.0 %   MCV 89.2 78.0 - 100.0 fL   MCH 32.1 26.0 - 34.0 pg   MCHC 36.0 30.0 - 36.0 g/dL   RDW 12.7 11.5 - 15.5 %   Platelets 183 150 - 400 K/uL   Neutrophils Relative % 78 %   Neutro Abs 4.5 1.7 - 7.7 K/uL   Lymphocytes Relative 16 %   Lymphs Abs 0.9 0.7 - 4.0 K/uL   Monocytes Relative 5 %   Monocytes Absolute 0.3 0.1 - 1.0 K/uL   Eosinophils Relative 1 %   Eosinophils Absolute 0.0 0.0 - 0.7 K/uL   Basophils Relative 0 %   Basophils Absolute 0.0 0.0 - 0.1 K/uL  CBG monitoring, ED     Status: Abnormal   Collection Time: 09/16/17  2:01 AM  Result Value Ref Range  Glucose-Capillary 531 (HH) 70 - 99 mg/dL   Comment 1 Notify RN    Comment 2 Document in Chart    Imaging Studies: No results found.  ED COURSE and MDM  Nursing notes and initial  vitals signs, including pulse oximetry, reviewed.  Vitals:   09/16/17 0037  BP: (!) 128/50  Pulse: (!) 58  Resp: 16  Temp: 97.9 F (36.6 C)  SpO2: 98%   2:24 AM Six runs of potassium chloride IV ordered for critical hypokalemia plus 40 mEq of oral potassium chloride.  Insulin held at hospitalist's (Dr. Moise Boring) request.  Will have patient admitted.  PROCEDURES   CRITICAL CARE Performed by: Shanon Rosser L Total critical care time: 30 minutes Critical care time was exclusive of separately billable procedures and treating other patients. Critical care was necessary to treat or prevent imminent or life-threatening deterioration. Critical care was time spent personally by me on the following activities: development of treatment plan with patient and/or surrogate as well as nursing, discussions with consultants, evaluation of patient's response to treatment, examination of patient, obtaining history from patient or surrogate, ordering and performing treatments and interventions, ordering and review of laboratory studies, ordering and review of radiographic studies, pulse oximetry and re-evaluation of patient's condition.   ED DIAGNOSES     ICD-10-CM   1. Hypokalemia E87.6   2. Hyperglycemia R73.9   3. Weakness R53.1   4. Diarrhea in adult patient R19.7   5. AKI (acute kidney injury) ([place]) N17.9        Eion Timbrook, MD 09/16/17 [phone]

## 2017-09-16 NOTE — Progress Notes (Signed)
  Echocardiogram 2D Echocardiogram and Echocardiogram Pharmacologic Stress Test has been performed.  Merrie Roof F 09/16/2017, 4:14 PM

## 2017-09-16 NOTE — ED Notes (Signed)
Bed: FJ01 Expected date:  Expected time:  Means of arrival:  Comments: 84 yr old weakness

## 2017-09-16 NOTE — Progress Notes (Signed)
CRITICAL VALUE ALERT  Critical Value: Glucose 504, Trop 0.09  Date & Time Notied:  0745  Provider Notified: [place]  Orders Received/Actions taken: insulin drip ordered

## 2017-09-16 NOTE — Procedures (Signed)
DKA  S/p Rt IJ TL picc  Tip svcra No comp Stable EBL 0 Ready for use Full report in pacs

## 2017-09-16 NOTE — ED Notes (Signed)
Potassium infusing at 50. Pt is unable to tolerate the rate of 100 even with maintenance fluid.

## 2017-09-16 NOTE — ED Notes (Signed)
Pt placed on purewick for urine collection. 

## 2017-09-16 NOTE — ED Notes (Signed)
Pt eating chicken noodle soup. She is laughing and says that she feels a lot better.

## 2017-09-16 NOTE — Progress Notes (Addendum)
PROGRESS NOTE    Carmen [name]  YTW:[id number] DOB: 06-12-1933 DOA: 09/16/2017 PCP: Glendale Chard, MD    Brief Narrative:  Carmen [name] is a 84 y.o. female with history of stroke with right-sided weakness, peripheral artery disease status post angioplasty, diastolic dysfunction, sick sinus syndrome status post pacemaker placement, chronic kidney disease stage IV was brought to the ER with complaints of having persistent diarrhea since morning.  Patient's daughter states that patient had some hemorrhoidal problem 2 days ago and had hemorrhoid cream placed.  Following which patient started developing severe diarrhea.  Denies any vomiting.  Denies any abdominal pain but has been feeling generally weak.  Has not had any chest pain or shortness of breath.  ED Course: In the ER patient labs show severe hypokalemia with potassium less than 2.  Creatinine has increased from 1.9 in March 2019 to 3.2.  Patient was given fluid bolus and potassium replacement and admitted for acute on chronic renal failure secondary to diarrhea with severe hypokalemia.  Patient's blood sugar is also in the 500s.  Has taken NPH insulin yesterday morning at home as per the daughter but have not given further doses in the ER due to severe hypokalemia at this time.     Assessment & Plan:   Principal Problem:   Type 2 diabetes mellitus with hyperosmolar nonketotic hyperglycemia (HCC) Active Problems:   Hypokalemia   History of CVA (cerebrovascular accident) with associated mild right upper extremity hemiplegia   Pacemaker-[employer]   Dyslipidemia   Dehydration   Protein-calorie malnutrition, severe (HCC)   Metabolic acidosis   Acute on chronic kidney failure (HCC)   Uncontrolled type 2 diabetes mellitus with hyperglycemia (HCC)   ARF (acute renal failure) (HCC)   Diarrhea in adult patient   Hyperosmolar non-ketotic state in patient with type 2 diabetes mellitus ([place])   Acute renal failure superimposed on  stage 4 chronic kidney disease ([place])   #1 type 2 diabetes mellitus with hyperosmolar nonketotic hyperglycemia Patient had presented with severe hypokalemia, who presented with significant weakness, noted to have had some diarrhea.  Urinalysis done this morning worrisome for UTI.  Patient noted to have a anion gap of 16 with a blood glucose level of 504.  Bicarb of 29.  Patient with no respiratory symptoms.  Will check a portable chest x-ray.  Cycle cardiac enzymes every 6 hours x3.  Check a 2D echo.  Transferred to the stepdown unit placed on the glucose stabilizer.  CBGs every hour.  Basic metabolic profile every 4 hours.  IV fluids.  Place empirically on IV Rocephin for probable UTI.  Follow.  2.  Severe hypokalemia Likely secondary to diuretics.  Per family patient's diuretics were resumed by PCP recently.  Patient also noted to have some diarrhea per admission history and physical.  Potassium improving.  Magnesium level at 1.7.  Keep magnesium greater than 2.  Replete potassium and monitor with correction of hyperosmolar nonketotic hyperglycemia.  Follow.  3.  Dehydration Gentle hydration with IV fluids.  Patient with history of chronic kidney disease stage IV monitor closely for volume overload.  4.  Acute on chronic kidney disease stage IV Likely secondary to a prerenal azotemia as patient noted to be significantly dry on examination.  Patient had presented with diarrhea.  Also noted per family that patient was resumed back on her diuretics.  Gentle hydration monitor closely for volume overload.  5.  History of CVA Stable.  Continue Plavix for secondary stroke prophylaxis.  6.  Hypertension Continue home regimen of hydralazine, imdur, Lopressor.  7.  Vitamin D deficiency Significantly decreased vitamin D 25-hydroxy was 6.2.  Place back on Os-Cal with vitamin D, vitamin D 50,000 units q. weekly.  Follow.  8.  Generalized weakness Likely secondary to dehydration and volume depletion.   PT/OT.  9.  Diarrhea Questionable etiology.  Patient with no further episodes of diarrhea.  Follow.  10.  History of peripheral vascular disease/coronary artery disease Stable.  Continue aspirin, Plavix, Lopressor, hydralazine,imdur.  11.  History of sick sinus syndrome status post PPM  12.  Probable UTI Urine cultures pending.  IV Rocephin.   DVT prophylaxis: Heparin Code Status: Full Family Communication: Updated patient and family. Disposition Plan: Transfer to stepdown unit.   Consultants:   None  Procedures:   Right IJ TL picc per Dr. Annamaria Boots 09/16/2017  Antimicrobials:   IV Rocephin 09/16/2017   Subjective: In bed.  Denies any chest pain.  No shortness of breath.  States she feels a little bit better than on admission.  Objective: Vitals:   09/16/17 0300 09/16/17 0328 09/16/17 0422 09/16/17 0547  BP:  (!) 138/54 130/65   Pulse: (!) 59 62 64   Resp: 13 17 18    Temp:   97.9 F (36.6 C)   TempSrc:   Oral   SpO2: 98% 100% 99%   Weight:    41.9 kg (92 lb 6 oz)    Intake/Output Summary (Last 24 hours) at 09/16/2017 0902 Last data filed at 09/16/2017 0653 Gross per 24 hour  Intake 470 ml  Output 220 ml  Net 250 ml   Filed Weights   09/16/17 0547  Weight: 41.9 kg (92 lb 6 oz)    Examination:  General exam: Appears calm and comfortable.  Dry mucous membranes. Respiratory system: Clear to auscultation. Respiratory effort normal. Cardiovascular system: S1 & S2 heard, RRR. No JVD, murmurs, rubs, gallops or clicks. No pedal edema. Gastrointestinal system: Abdomen is soft nondistended, soft and nontender. No organomegaly or masses felt. Normal bowel sounds heard. Central nervous system: Alert and oriented. No focal neurological deficits. Extremities: Symmetric 5 x 5 power. Skin: No rashes, lesions or ulcers Psychiatry: Judgement and insight appear poor to fair. Mood & affect appropriate.     Data Reviewed: I have personally reviewed following labs and  imaging studies  CBC: Recent Labs  Lab 09/16/17 0113  WBC 5.7  NEUTROABS 4.5  HGB 13.7  HCT 38.1  MCV 89.2  PLT 150   Basic Metabolic Panel: Recent Labs  Lab 09/16/17 0113 09/16/17 0636  NA 131* 133*  K <2.0* 3.6  CL 86* 88*  CO2 28 29  GLUCOSE 575* 504*  BUN 46* 45*  CREATININE 3.26* 3.14*  CALCIUM 6.9* 7.2*  MG  --  1.7   GFR: Estimated Creatinine Clearance: 9.3 mL/min (A) (by C-G formula based on SCr of 3.14 mg/dL (H)). Liver Function Tests: Recent Labs  Lab 09/16/17 0636  AST 17  ALT 13  ALKPHOS 107  BILITOT 0.4  PROT 5.6*  ALBUMIN 2.1*   No results for input(s): LIPASE, AMYLASE in the last 168 hours. No results for input(s): AMMONIA in the last 168 hours. Coagulation Profile: No results for input(s): INR, PROTIME in the last 168 hours. Cardiac Enzymes: Recent Labs  Lab 09/16/17 0636  TROPONINI 0.09*   BNP (last 3 results) No results for input(s): PROBNP in the last 8760 hours. HbA1C: No results for input(s): HGBA1C in the last 72 hours. CBG:  Recent Labs  Lab 09/16/17 0201 09/16/17 0752  GLUCAP 531* 423*   Lipid Profile: No results for input(s): CHOL, HDL, LDLCALC, TRIG, CHOLHDL, LDLDIRECT in the last 72 hours. Thyroid Function Tests: No results for input(s): TSH, T4TOTAL, FREET4, T3FREE, THYROIDAB in the last 72 hours. Anemia Panel: No results for input(s): VITAMINB12, FOLATE, FERRITIN, TIBC, IRON, RETICCTPCT in the last 72 hours. Sepsis Labs: No results for input(s): PROCALCITON, LATICACIDVEN in the last 168 hours.  No results found for this or any previous visit (from the past 240 hour(s)).       Radiology Studies: No results found.      Scheduled Meds: . aspirin EC  81 mg Oral Daily  . clopidogrel  75 mg Oral Daily  . feeding supplement (ENSURE ENLIVE)  237 mL Oral BID BM  . heparin  5,000 Units Subcutaneous Q8H  . hydrALAZINE  10 mg Oral TID  . isosorbide mononitrate  120 mg Oral Daily  . metoprolol tartrate  25 mg  Oral BID  . omega-3 acid ethyl esters  1 g Oral Daily   Continuous Infusions: . sodium chloride    . cefTRIAXone (ROCEPHIN)  IV    . dextrose 5 % and 0.45% NaCl    . insulin (NOVOLIN-R) infusion    . magnesium sulfate 1 - 4 g bolus IVPB    . sodium chloride       LOS: 0 days    Time spent: 65 minutes  No charge.    Irine Seal, MD [facility] Pager [phone]  If 7PM-7AM, please contact night-coverage [email or web address] Password [email or web address] 09/16/2017, 9:02 AM

## 2017-09-16 NOTE — ED Notes (Signed)
ED TO INPATIENT HANDOFF REPORT  Name/Age/Gender Carmen [name] 84 y.o. female  Code Status Code Status History    Date Active Date Inactive Code Status Order ID Comments User Context   05/07/2017 0126 05/10/2017 1937 DNR [id number]  Merton Border, MD Inpatient   07/26/2016 0958 07/27/2016 1850 DNR [id number]  Eugenie Filler, MD Inpatient   07/25/2016 2022 07/26/2016 0958 Full Code [id number]  Eugenie Filler, MD Inpatient   07/25/2016 1627 07/25/2016 2021 DNR [id number]  Eugenie Filler, MD ED   01/13/2016 2047 01/15/2016 1705 DNR [id number]  Reubin Milan, MD Inpatient   01/13/2016 2047 01/13/2016 2047 DNR [id number]  Reubin Milan, MD Inpatient   10/29/2015 0131 10/30/2015 1703 DNR [id number]  [name], MD ED   02/26/2015 2102 03/01/2015 1344 DNR [id number]  Schorr, Rhetta Mura, NP Inpatient   02/26/2015 1517 02/26/2015 2102 DNR [id number]  Melton Alar, PA-C Inpatient   01/14/2015 0549 01/16/2015 1410 Full Code [id number]  Norval Morton, MD Inpatient   11/05/2014 1803 11/07/2014 1957 Full Code [id number]  Serafina Mitchell, MD Inpatient   10/28/2014 1630 11/05/2014 1803 Full Code [id number]  Florencia Reasons, MD Inpatient   10/28/2014 1551 10/28/2014 1630 Full Code [id number]  Florencia Reasons, MD ED   09/11/2014 2046 09/16/2014 1747 Full Code [id number]  Charlynne Cousins, MD Inpatient   08/03/2013 2037 08/09/2013 1629 Full Code [id number]  Etta Quill, DO ED   06/02/2013 2050 06/03/2013 2114 Full Code [id number]  Shanda Howells, MD ED   04/06/2013 0732 04/08/2013 1919 Full Code [id number]  Robbie Lis, MD Inpatient   04/05/2013 1703 04/06/2013 0732 Full Code [id number]  Robbie Lis, MD ED   11/30/2012 2228 12/12/2012 1934 Full Code [id number]  Domenic Polite, MD Inpatient   08/12/2012 1731 08/16/2012 2011 Full Code [id number]  Elvera Lennox, MD Inpatient   04/30/2012 1827 05/02/2012 1617 DNR [id number]  Samuella Cota, MD ED   03/07/2012 1013 03/08/2012 1847 Full Code [id number]  Samuella Cota, MD  Inpatient   11/11/2011 0618 11/13/2011 1655 Full Code [id number]  Hardie Pulley, RN Inpatient   10/15/2011 2031 10/18/2011 1921 Full Code [id number]  Sid Falcon, RN Inpatient   07/29/2011 1310 07/31/2011 1257 Full Code [id number]  Shona Simpson, RN Inpatient    Questions for Most Recent Historical Code Status (Order [id number])    Question Answer Comment   In the event of cardiac or respiratory ARREST Do not call a "code blue"    In the event of cardiac or respiratory ARREST Do not perform Intubation, CPR, defibrillation or ACLS    In the event of cardiac or respiratory ARREST Use medication by any route, position, wound care, and other measures to relive pain and suffering. May use oxygen, suction and manual treatment of airway obstruction as needed for comfort.       Home/SNF/Other Home  Chief Complaint weakness  Level of Care/Admitting Diagnosis ED Disposition    ED Disposition Condition Comment   Admit  Hospital Area: [facility] [[id number]]  Level of Care: Telemetry [5]  Admit to tele based on following criteria: Complex arrhythmia (Bradycardia/Tachycardia)  Diagnosis: Hypokalemia [017494]  Admitting Physician: [name] [phone]  Attending Physician: [name] [phone]  PT Class (Do Not Modify): Observation [104]  PT Acc Code (Do Not Modify): Observation [10022]       Medical History Past Medical History:  Diagnosis Date  . Anxiety   . Arthritis    "  everywhere"  . Cataract    "both eyes; blood pressure's always too high to have them fixed" (09/11/2014)  . Chronic lower back pain   . Claudication in peripheral vascular disease ([place])    02/16/10: Left CIA 9.0x28 Omnilink and REIA 8.0x40 seff expanding Zilver.   right subclavian artery stent 03/18/2008,  . Coronary artery disease   . Herniated lumbar intervertebral disc   . Hypertension   . IDDM (insulin dependent diabetes mellitus) ([place])   . Migraine    "used to have terrible  migraines; stopped in the 1990's"  . Pacemaker   . Peripheral vascular disease ([place])   . Pneumonia X 2  . Renal disorder   . Renovascular hypertension     s/p left renal artery stent 12/2007.  S/P balloon angioplasty on 02/16/10 for ISR, BP was  controlled well since then.     . Stroke [facility]) 1999   Stroke and TIA in 1999 with right sided weakness, now with residual right arm weakness (09/11/2014)  . UTI (lower urinary tract infection) 02/2015    Allergies Allergies  Allergen Reactions  . Norvasc [Amlodipine Besylate] Swelling  . Peanut-Containing Drug Products Other (See Comments)    Stomach pain    IV Location/Drains/Wounds Patient Lines/Drains/Airways Status   Active Line/Drains/Airways    Name:   Placement date:   Placement time:   Site:   Days:   Implanted Port Left Chest   -    -    Chest      Peripheral IV 09/16/17 Left Antecubital   09/16/17    0101    Antecubital   less than 1   CVC Double Lumen 07/26/16 Right Internal jugular 14 cm   07/26/16    1202     417          Labs/Imaging Results for orders placed or performed during the hospital encounter of 09/16/17 (from the past 48 hour(s))  Basic metabolic panel     Status: Abnormal   Collection Time: 09/16/17  1:13 AM  Result Value Ref Range   Sodium 131 (L) 135 - 145 mmol/L   Potassium <2.0 (LL) 3.5 - 5.1 mmol/L    Comment: REPEATED TO VERIFY CRITICAL RESULT CALLED TO, READ BACK BY AND VERIFIED WITH: K MINCIA,RN @0214  09/16/17 MKELLY    Chloride 86 (L) 98 - 111 mmol/L    Comment: Please note change in reference range.   CO2 28 22 - 32 mmol/L   Glucose, Bld 575 (HH) 70 - 99 mg/dL    Comment: Please note change in reference range. CRITICAL RESULT CALLED TO, READ BACK BY AND VERIFIED WITH: K MINCIA,RN @0215  09/16/17 MKELLY    BUN 46 (H) 8 - 23 mg/dL    Comment: Please note change in reference range.   Creatinine, Ser 3.26 (H) 0.44 - 1.00 mg/dL   Calcium 6.9 (L) 8.9 - 10.3 mg/dL   GFR calc non Af Amer 12 (L)  >60 mL/min   GFR calc Af Amer 14 (L) >60 mL/min    Comment: (NOTE) The eGFR has been calculated using the CKD EPI equation. This calculation has not been validated in all clinical situations. eGFR's persistently <60 mL/min signify possible Chronic Kidney Disease.    Anion gap 17 (H) 5 - 15    Comment: Performed at [facility], [place] [address], [place], [place] [address]  CBC with Differential/Platelet     Status: None   Collection Time: 09/16/17  1:13 AM  Result Value  Ref Range   WBC 5.7 4.0 - 10.5 K/uL   RBC 4.27 3.87 - 5.11 MIL/uL   Hemoglobin 13.7 12.0 - 15.0 g/dL   HCT 38.1 36.0 - 46.0 %   MCV 89.2 78.0 - 100.0 fL   MCH 32.1 26.0 - 34.0 pg   MCHC 36.0 30.0 - 36.0 g/dL   RDW 12.7 11.5 - 15.5 %   Platelets 183 150 - 400 K/uL   Neutrophils Relative % 78 %   Neutro Abs 4.5 1.7 - 7.7 K/uL   Lymphocytes Relative 16 %   Lymphs Abs 0.9 0.7 - 4.0 K/uL   Monocytes Relative 5 %   Monocytes Absolute 0.3 0.1 - 1.0 K/uL   Eosinophils Relative 1 %   Eosinophils Absolute 0.0 0.0 - 0.7 K/uL   Basophils Relative 0 %   Basophils Absolute 0.0 0.0 - 0.1 K/uL    Comment: Performed at [facility], [place] [address] [place], [place] [address]  CBG monitoring, ED     Status: Abnormal   Collection Time: 09/16/17  2:01 AM  Result Value Ref Range   Glucose-Capillary 531 (HH) 70 - 99 mg/dL   Comment 1 Notify RN    Comment 2 Document in Chart    No results found.  Pending Labs Unresulted Labs (From admission, onward)   Start     Ordered   09/16/17 0118  Gastrointestinal Panel by PCR , Stool  (Gastrointestinal Panel by PCR, Stool)  Once,   R     09/16/17 0117   09/16/17 0117  C difficile quick scan w PCR reflex  (C Difficile quick screen w PCR reflex panel)  Once, for 24 hours,   R     09/16/17 0117   09/16/17 0053  Urinalysis, Routine w reflex microscopic  Once,   STAT     09/16/17 0052      Vitals/Pain Today's Vitals   09/16/17 0037 09/16/17 0230  09/16/17 0300  BP: (!) 128/50    Pulse: (!) 58 (!) 59 (!) 59  Resp: 16 14 13   Temp: 97.9 F (36.6 C)    SpO2: 98% 98% 98%  PainSc: 0-No pain      Isolation Precautions No active isolations  Medications Medications  sodium chloride 0.9 % bolus 250 mL (250 mLs Intravenous New Bag/Given 09/16/17 0234)  potassium chloride 10 mEq in 100 mL IVPB (10 mEq Intravenous New Bag/Given 09/16/17 0234)  potassium chloride 20 MEQ/15ML (10%) solution 40 mEq (has no administration in time range)    Mobility walks with person assist

## 2017-09-16 NOTE — H&P (Signed)
History and Physical    Carmen [name] QQP:[id number] DOB: 02/18/1934 DOA: 09/16/2017  PCP: Glendale Chard, MD  Patient coming from: Home.  Chief Complaint: Diarrhea and weakness.  HPI: Carmen [name] is a 84 y.o. female with history of stroke with right-sided weakness, peripheral artery disease status post angioplasty, diastolic dysfunction, sick sinus syndrome status post pacemaker placement, chronic kidney disease stage IV was brought to the ER with complaints of having persistent diarrhea since morning.  Patient's daughter states that patient had some hemorrhoidal problem 2 days ago and had hemorrhoid cream placed.  Following which patient started developing severe diarrhea.  Denies any vomiting.  Denies any abdominal pain but has been feeling generally weak.  Has not had any chest pain or shortness of breath.  ED Course: In the ER patient labs show severe hypokalemia with potassium less than 2.  Creatinine has increased from 1.9 in March 2019 to 3.2.  Patient was given fluid bolus and potassium replacement and admitted for acute on chronic renal failure secondary to diarrhea with severe hypokalemia.  Patient's blood sugar is also in the 500s.  Has taken NPH insulin yesterday morning at home as per the daughter but have not given further doses in the ER due to severe hypokalemia at this time.  Review of Systems: As per HPI, rest all negative.   Past Medical History:  Diagnosis Date  . Anxiety   . Arthritis    "everywhere"  . Cataract    "both eyes; blood pressure's always too high to have them fixed" (09/11/2014)  . Chronic lower back pain   . Claudication in peripheral vascular disease ([place])    02/16/10: Left CIA 9.0x28 Omnilink and REIA 8.0x40 seff expanding Zilver.   right subclavian artery stent 03/18/2008,  . Coronary artery disease   . Herniated lumbar intervertebral disc   . Hypertension   . IDDM (insulin dependent diabetes mellitus) ([place])   . Migraine    "used to have  terrible migraines; stopped in the 1990's"  . Pacemaker   . Peripheral vascular disease ([place])   . Pneumonia X 2  . Renal disorder   . Renovascular hypertension     s/p left renal artery stent 12/2007.  S/P balloon angioplasty on 02/16/10 for ISR, BP was  controlled well since then.     . Stroke [facility]) 1999   Stroke and TIA in 1999 with right sided weakness, now with residual right arm weakness (09/11/2014)  . UTI (lower urinary tract infection) 02/2015    Past Surgical History:  Procedure Laterality Date  . ABDOMINAL ANGIOGRAM N/A 07/06/2011   Procedure: ABDOMINAL ANGIOGRAM;  Surgeon: Laverda Page, MD;  Location: [facility] CATH LAB;  Service: Cardiovascular;  Laterality: N/A;  . APPENDECTOMY    . BACK SURGERY    . CAROTID ENDARTERECTOMY Left 1991    Stroke and TIA in 1999 with right sided weakness, now with residual right arm weakness  . CARPAL TUNNEL RELEASE Left   . COLONOSCOPY  07/30/2011   Procedure: COLONOSCOPY;  Surgeon: Inda Castle, MD;  Location: [place];  Service: Endoscopy;  Laterality: N/A;  gi bleed  . ESOPHAGOGASTRODUODENOSCOPY  07/30/2011   Procedure: ESOPHAGOGASTRODUODENOSCOPY (EGD);  Surgeon: Inda Castle, MD;  Location: [place];  Service: Endoscopy;  Laterality: N/A;  . EXCISIONAL HEMORRHOIDECTOMY    . INSERT / REPLACE / REMOVE PACEMAKER    . IR FLUORO GUIDE CV LINE RIGHT  07/26/2016  . IR US GUIDE VASC ACCESS RIGHT  07/26/2016  .  LOWER EXTREMITY ANGIOGRAM Right 05/29/2012   Procedure: LOWER EXTREMITY ANGIOGRAM;  Surgeon: Laverda Page, MD;  Location: [facility] CATH LAB;  Service: Cardiovascular;  Laterality: Right;  . LUMBAR LAMINECTOMY     'herniated disc"  . PERIPHERAL VASCULAR CATHETERIZATION N/A 11/05/2014   Procedure: Abdominal Aortogram;  Surgeon: Serafina Mitchell, MD;  Location: [place] CV LAB;  Service: Cardiovascular;  Laterality: N/A;  . PERMANENT PACEMAKER INSERTION Left 06/28/2011   Procedure: PERMANENT PACEMAKER INSERTION;  Surgeon: Deboraha Sprang, MD;  Location: [facility] CATH LAB;  Service: Cardiovascular;  Laterality: Left;  . RENAL ANGIOGRAM N/A 07/06/2011   Procedure: RENAL ANGIOGRAM;  Surgeon: Laverda Page, MD;  Location: [facility] CATH LAB;  Service: Cardiovascular;  Laterality: N/A;  . TONSILLECTOMY    . URETERAL STENT PLACEMENT    . VAGINAL HYSTERECTOMY       reports that she has quit smoking. Her smoking use included cigarettes. She smoked 0.00 packs per day for 70.00 years. She has never used smokeless tobacco. She reports that she does not drink alcohol or use drugs.  Allergies  Allergen Reactions  . Norvasc [Amlodipine Besylate] Swelling  . Peanut-Containing Drug Products Other (See Comments)    Stomach pain    Family History  Problem Relation Age of Onset  . Cancer Father        Colon cancer  . Heart attack Mother     Prior to Admission medications   Medication Sig Start Date End Date Taking? Authorizing Provider  acetaminophen (TYLENOL) 325 MG tablet Take 2 tablets (650 mg total) by mouth every 4 (four) hours as needed for headache or mild pain. 01/15/16  Yes Regalado, Belkys A, MD  aspirin EC 81 MG tablet Take 81 mg by mouth daily.   Yes [provider]  clopidogrel (PLAVIX) 75 MG tablet Take 75 mg by mouth daily.   Yes [provider]  diclofenac sodium (VOLTAREN) 1 % GEL Apply 2 g topically 4 (four) times daily.  04/28/17  Yes [provider]  furosemide (LASIX) 20 MG tablet Take 1 tablet (20 mg total) by mouth daily. Resume at lower dose in 2 days. Patient taking differently: Take 20 mg by mouth 2 (two) times daily.  07/29/16  Yes Eugenie Filler, MD  hydrALAZINE (APRESOLINE) 10 MG tablet Take 1 tablet (10 mg total) by mouth 3 (three) times daily. 05/10/17  Yes Hosie Poisson, MD  insulin NPH-regular Human (NOVOLIN 70/30) (70-30) 100 UNIT/ML injection Inject 5 Units into the skin 2 (two) times daily. 10/30/15  Yes Short, Noah Delaine, MD  isosorbide mononitrate (IMDUR) 120 MG 24 hr tablet  Take 1 tablet (120 mg total) by mouth daily. Resume in 2 days. 05/10/17  Yes Hosie Poisson, MD  metoprolol tartrate (LOPRESSOR) 25 MG tablet Take 25 mg by mouth 2 (two) times daily.   Yes [provider]  Omega-3 Fatty Acids (FISH OIL) 1000 MG CAPS Take 1,000 mg by mouth 2 (two) times daily.    Yes [provider]  potassium chloride SA (K-DUR,KLOR-CON) 20 MEQ tablet Take 1 tablet (20 mEq total) by mouth daily. 07/29/16  Yes Eugenie Filler, MD  Vitamin D, Ergocalciferol, (DRISDOL) 50000 units CAPS capsule Take 1 capsule (50,000 Units total) by mouth every 7 (seven) days. 05/17/17  Yes Hosie Poisson, MD  calcium-vitamin D (OSCAL WITH D) 500-200 MG-UNIT tablet Take 2 tablets by mouth 4 (four) times daily. Patient not taking: Reported on 09/16/2017 07/27/16   Eugenie Filler, MD  docusate  sodium (COLACE) 100 MG capsule Take 1 capsule (100 mg total) by mouth daily. Patient not taking: Reported on 05/07/2017 04/16/17   Montine Circle, PA-C  oseltamivir (TAMIFLU) 30 MG capsule Take 1 capsule (30 mg total) by mouth daily. 05/11/17   Hosie Poisson, MD  aliskiren (TEKTURNA) 150 MG tablet Take 150 mg by mouth daily.  05/29/11  [provider]  gabapentin (NEURONTIN) 100 MG capsule Take 1 capsule (100 mg total) by mouth 3 (three) times daily. 04/06/11 05/29/11  Nita Sells, MD    Physical Exam: Vitals:   09/16/17 0037 09/16/17 0230 09/16/17 0300 09/16/17 0328  BP: (!) 128/50   (!) 138/54  Pulse: (!) 58 (!) 59 (!) 59 62  Resp: 16 14 13 17   Temp: 97.9 F (36.6 C)     SpO2: 98% 98% 98% 100%      Constitutional: Moderately built and nourished. Vitals:   09/16/17 0037 09/16/17 0230 09/16/17 0300 09/16/17 0328  BP: (!) 128/50   (!) 138/54  Pulse: (!) 58 (!) 59 (!) 59 62  Resp: 16 14 13 17   Temp: 97.9 F (36.6 C)     SpO2: 98% 98% 98% 100%   Eyes: Anicteric no pallor. ENMT: No discharge from the ears eyes nose or mouth. Neck: No mass felt.  No neck rigidity no JVD  appreciated. Respiratory: No rhonchi or crepitations. Cardiovascular: S1-S2 heard no murmurs appreciated. Abdomen: Soft nontender bowel sounds present. Musculoskeletal: No edema.  No joint effusion. Skin: No rash. Neurologic: Alert awake oriented to time place and person.  Mild weakness of the right side from previous stroke. Psychiatric: Appears normal.  Normal affect.   Labs on Admission: I have personally reviewed following labs and imaging studies  CBC: Recent Labs  Lab 09/16/17 0113  WBC 5.7  NEUTROABS 4.5  HGB 13.7  HCT 38.1  MCV 89.2  PLT 563   Basic Metabolic Panel: Recent Labs  Lab 09/16/17 0113  NA 131*  K <2.0*  CL 86*  CO2 28  GLUCOSE 575*  BUN 46*  CREATININE 3.26*  CALCIUM 6.9*   GFR: CrCl cannot be calculated (Unknown ideal weight.). Liver Function Tests: No results for input(s): AST, ALT, ALKPHOS, BILITOT, PROT, ALBUMIN in the last 168 hours. No results for input(s): LIPASE, AMYLASE in the last 168 hours. No results for input(s): AMMONIA in the last 168 hours. Coagulation Profile: No results for input(s): INR, PROTIME in the last 168 hours. Cardiac Enzymes: No results for input(s): CKTOTAL, CKMB, CKMBINDEX, TROPONINI in the last 168 hours. BNP (last 3 results) No results for input(s): PROBNP in the last 8760 hours. HbA1C: No results for input(s): HGBA1C in the last 72 hours. CBG: Recent Labs  Lab 09/16/17 0201  GLUCAP 531*   Lipid Profile: No results for input(s): CHOL, HDL, LDLCALC, TRIG, CHOLHDL, LDLDIRECT in the last 72 hours. Thyroid Function Tests: No results for input(s): TSH, T4TOTAL, FREET4, T3FREE, THYROIDAB in the last 72 hours. Anemia Panel: No results for input(s): VITAMINB12, FOLATE, FERRITIN, TIBC, IRON, RETICCTPCT in the last 72 hours. Urine analysis:    Component Value Date/Time   COLORURINE STRAW (A) 05/06/2017 2122   APPEARANCEUR CLEAR 05/06/2017 2122   LABSPEC 1.010 05/06/2017 2122   PHURINE 5.0 05/06/2017 2122    GLUCOSEU >=500 (A) 05/06/2017 2122   HGBUR SMALL (A) 05/06/2017 2122   BILIRUBINUR NEGATIVE 05/06/2017 2122   [place] NEGATIVE 05/06/2017 2122   PROTEINUR 100 (A) 05/06/2017 2122   UROBILINOGEN 0.2 01/13/2015 2034   NITRITE NEGATIVE 05/06/2017  2122   LEUKOCYTESUR NEGATIVE 05/06/2017 2122   Sepsis Labs: @LABRCNTIP (procalcitonin:4,lacticidven:4) )No results found for this or any previous visit (from the past 240 hour(s)).   Radiological Exams on Admission: No results found.  EKG: Independently reviewed.  Paced rhythm.  Assessment/Plan Principal Problem:   Acute on chronic kidney failure (HCC) Active Problems:   Hypokalemia   Pacemaker-[employer]   Uncontrolled type 2 diabetes mellitus with hyperglycemia ([place])   ARF (acute renal failure) (HCC)   Diarrhea in adult patient    1. Acute on chronic kidney disease stage IV -renal function worsened due to diarrhea.  Patient received fluid bolus.  Follow metabolic panel.  Hold Lasix. 2. Severe hypokalemia likely from diarrhea.  Patient also used to take Lasix.  Hold Lasix replace potassium check magnesium.  Follow metabolic panel. 3. Diabetes mellitus type 2 uncontrolled with hyperglycemia -follow metabolic panel closely.  Since patient has severe hypokalemia no further doses of insulin has been given until potassium gets corrected. 4. Diarrhea -has not had any further episodes of diarrhea in the ER.  If there is any further episodes then will need stool studies.  Denies taking any antibiotics recently or sick contacts. 5. Generalized weakness secondary to dehydration and hypokalemia. 6. History of diastolic dysfunction holding Lasix due to dehydration. 7. History of sick sinus syndrome status post pacemaker placement. 8. History of peripheral vascular disease and CAD on aspirin Plavix.   9. Hypertension on hydralazine and do metoprolol.  Note that patient's blood sugar is elevated but has not been corrected with insulin due to  severe hypokalemia.  Once potassium improves please place patient on insulin and sliding scale coverage.   DVT prophylaxis: Heparin. Code Status: DNR. Family Communication: Patient's daughter. Disposition Plan: Home. Consults called: None. Admission status: Observation.   [name] MD [facility] Pager [phone].  If 7PM-7AM, please contact night-coverage [email or web address] Password [facility]  09/16/2017, 4:15 AM

## 2017-09-16 NOTE — Progress Notes (Signed)
CRITICAL VALUE ALERT  Critical Value:  Potassium 2.7  Date & Time Notied:  09/16/17 1730  Provider Notified: Dr. Grandville Silos  Orders Received/Actions taken: see orders

## 2017-09-16 NOTE — ED Notes (Signed)
Pt has a critical K of less than 2.0   Pt has a critical glucose 575

## 2017-09-16 NOTE — Progress Notes (Signed)
Inpatient Diabetes Program Recommendations  AACE/ADA: New Consensus Statement on Inpatient Glycemic Control (2015)  Target Ranges:  Prepandial:   less than 140 mg/dL      Peak postprandial:   less than 180 mg/dL (1-2 hours)      Critically ill patients:  140 - 180 mg/dL   Lab Results  Component Value Date   GLUCAP 276 (H) 09/16/2017   HGBA1C >15.5 (H) 07/25/2016    Review of Glycemic Control  Diabetes history: DM2 Outpatient Diabetes medications: 70/30 5 units bid Current orders for Inpatient glycemic control: IV insulin per DKA order set   Inpatient Diabetes Program Recommendations:     Need updated HgbA1C. Continue insulin drip until criteria met for discontinuation.  Will follow.  Thank you. Lorenda Peck, RD, LDN, CDE Inpatient Diabetes Coordinator [phone]

## 2017-09-16 NOTE — Progress Notes (Signed)
Initial Nutrition Assessment  DOCUMENTATION CODES:   Severe malnutrition in context of chronic illness, Underweight  INTERVENTION:    Glucerna Shake po TID, each supplement provides 220 kcal and 10 grams of protein  NUTRITION DIAGNOSIS:   Severe Malnutrition related to chronic illness as evidenced by severe fat depletion, severe muscle depletion.  GOAL:   Patient will meet greater than or equal to 90% of their needs  MONITOR:   PO intake, Supplement acceptance, Weight trends, Labs, Diet advancement  REASON FOR ASSESSMENT:   Malnutrition Screening Tool    ASSESSMENT:   Patient with PMH significant for stroke with right sided weakness, PAD s/p angioplasty, sick sinus syndrome s/p pacemaker, CKD IV, HTN, and IDDM. Presents this admission with acute on chronic CKD IV, severe hypokalemia due to diarrhea, and uncontrolled DM.    Spoke with pt's daughter at bedside and pt's husband on the phone. Husband does most of the cooking and noticed a decline in pt's intake over the last week. She typically finishs her entire plate of food but this week would only take a couple of bites. Husband fixes meals that contain fish, fried chicken, vegetables, and grains. Pt drinks Ensure's off and on. Pt very eager to eat asking multiple times during the visit if this RD could provide her with food. She is NPO at this time. Will provide supplements once diet is advanced.   Pt endorses a UBW of 100 lb. Records indicate pt weighed 89 lb 05/08/17 and 95 lb this admission. Nutrition-Focused physical exam completed.   Medications reviewed and include: calcium-vitamin D, D5 @ 100 ml/hr, Mg sulfate, 10 mEq KCl IV Labs reviewed: K 3.0 (L) glucose 472 (H)   NUTRITION - FOCUSED PHYSICAL EXAM:    Most Recent Value  Orbital Region  Moderate depletion  Upper Arm Region  Severe depletion  Thoracic and Lumbar Region  Unable to assess  Buccal Region  Severe depletion  Temple Region  Severe depletion  Clavicle  Bone Region  Severe depletion  Clavicle and Acromion Bone Region  Severe depletion  Scapular Bone Region  Unable to assess  Dorsal Hand  Moderate depletion  Patellar Region  Severe depletion  Anterior Thigh Region  Severe depletion  Posterior Calf Region  Severe depletion  Edema (RD Assessment)  None  Hair  Reviewed  Eyes  Reviewed  Mouth  Reviewed  Skin  Reviewed  Nails  Reviewed     Diet Order:   Diet Order           Diet NPO time specified Except for: Sips with Meds, Ice Chips  Diet effective now          EDUCATION NEEDS:   Not appropriate for education at this time  Skin:  Skin Assessment: Reviewed RN Assessment  Last BM:  09/16/17  Height:   Ht Readings from Last 1 Encounters:  09/16/17 5\' 2"  (1.575 m)    Weight:   Wt Readings from Last 1 Encounters:  09/16/17 95 lb 14.4 oz (43.5 kg)    Ideal Body Weight:  50 kg  BMI:  Body mass index is 17.54 kg/m.  Estimated Nutritional Needs:   Kcal:  1350-1550 kcal  Protein:  65-75 grams  Fluid:  >1.3 L/day    Mariana Single RD, LDN Clinical Nutrition Pager # - [phone]

## 2017-09-17 ENCOUNTER — Inpatient Hospital Stay (HOSPITAL_COMMUNITY): Payer: Medicare HMO

## 2017-09-17 DIAGNOSIS — E43 Unspecified severe protein-calorie malnutrition: Secondary | ICD-10-CM

## 2017-09-17 LAB — BASIC METABOLIC PANEL
ANION GAP: 7 (ref 5–15)
Anion gap: 9 (ref 5–15)
BUN: 41 mg/dL — ABNORMAL HIGH (ref 8–23)
BUN: 39 mg/dL — AB (ref 8–23)
CALCIUM: 7.2 mg/dL — AB (ref 8.9–10.3)
CALCIUM: 7.3 mg/dL — AB (ref 8.9–10.3)
CHLORIDE: 103 mmol/L (ref 98–111)
CO2: 29 mmol/L (ref 22–32)
CO2: 30 mmol/L (ref 22–32)
CREATININE: 2.54 mg/dL — AB (ref 0.44–1.00)
Chloride: 103 mmol/L (ref 98–111)
Creatinine, Ser: 2.4 mg/dL — ABNORMAL HIGH (ref 0.44–1.00)
GFR calc Af Amer: 19 mL/min — ABNORMAL LOW (ref >60)
GFR calc non Af Amer: 17 mL/min — ABNORMAL LOW (ref >60)
GFR calc non Af Amer: 18 mL/min — ABNORMAL LOW (ref >60)
GFR, EST AFRICAN AMERICAN: 21 mL/min — AB (ref >60)
GLUCOSE: 67 mg/dL — AB (ref 70–99)
Glucose, Bld: 80 mg/dL (ref 70–99)
POTASSIUM: 3.5 mmol/L (ref 3.5–5.1)
Potassium: 4.1 mmol/L (ref 3.5–5.1)
SODIUM: 140 mmol/L (ref 135–145)
Sodium: 141 mmol/L (ref 135–145)

## 2017-09-17 LAB — URINE CULTURE: CULTURE: NO GROWTH

## 2017-09-17 LAB — GLUCOSE, CAPILLARY
GLUCOSE-CAPILLARY: 105 mg/dL — AB (ref 70–99)
GLUCOSE-CAPILLARY: 210 mg/dL — AB (ref 70–99)
GLUCOSE-CAPILLARY: 226 mg/dL — AB (ref 70–99)
GLUCOSE-CAPILLARY: 227 mg/dL — AB (ref 70–99)
GLUCOSE-CAPILLARY: 39 mg/dL — AB (ref 70–99)
GLUCOSE-CAPILLARY: 58 mg/dL — AB (ref 70–99)
GLUCOSE-CAPILLARY: 73 mg/dL (ref 70–99)
Glucose-Capillary: 187 mg/dL — ABNORMAL HIGH (ref 70–99)
Glucose-Capillary: 37 mg/dL — CL (ref 70–99)
Glucose-Capillary: 86 mg/dL (ref 70–99)

## 2017-09-17 LAB — HEMOGLOBIN A1C
Hgb A1c MFr Bld: 16.2 % — ABNORMAL HIGH (ref 4.8–5.6)
Mean Plasma Glucose: 418.24 mg/dL

## 2017-09-17 LAB — MAGNESIUM: Magnesium: 1.8 mg/dL (ref 1.7–2.4)

## 2017-09-17 MED ORDER — MAGNESIUM SULFATE 4 GM/100ML IV SOLN
4.0000 g | Freq: Once | INTRAVENOUS | Status: AC
  Administered 2017-09-17: 4 g via INTRAVENOUS
  Filled 2017-09-17 (×2): qty 100

## 2017-09-17 MED ORDER — DEXTROSE 50 % IV SOLN
INTRAVENOUS | Status: AC
  Administered 2017-09-17: 25 mL via INTRAVENOUS
  Filled 2017-09-17: qty 50

## 2017-09-17 MED ORDER — ACETAMINOPHEN 500 MG PO TABS
500.0000 mg | ORAL_TABLET | Freq: Three times a day (TID) | ORAL | Status: DC
  Administered 2017-09-18 – 2017-09-19 (×4): 500 mg via ORAL
  Filled 2017-09-17 (×5): qty 1

## 2017-09-17 MED ORDER — POTASSIUM CHLORIDE CRYS ER 20 MEQ PO TBCR
40.0000 meq | EXTENDED_RELEASE_TABLET | Freq: Once | ORAL | Status: AC
  Administered 2017-09-17: 40 meq via ORAL
  Filled 2017-09-17: qty 2

## 2017-09-17 MED ORDER — DEXTROSE 50 % IV SOLN
25.0000 mL | Freq: Once | INTRAVENOUS | Status: AC
  Administered 2017-09-17: 25 mL via INTRAVENOUS

## 2017-09-17 NOTE — Progress Notes (Signed)
Pt's CBG @ 0349 was 32. Dextrose 50% 25 mL was administered at 0402. Repeat CBG at 0417 was 105. Will continue to monitor.

## 2017-09-17 NOTE — Progress Notes (Addendum)
PROGRESS NOTE    Carmen [name]  XBJ:[id number] DOB: Aug 27, 1933 DOA: 09/16/2017 PCP: Glendale Chard, MD    Brief Narrative:  Carmen [name] is a 84 y.o. female with history of stroke with right-sided weakness, peripheral artery disease status post angioplasty, diastolic dysfunction, sick sinus syndrome status post pacemaker placement, chronic kidney disease stage IV was brought to the ER with complaints of having persistent diarrhea since morning.  Patient's daughter states that patient had some hemorrhoidal problem 2 days ago and had hemorrhoid cream placed.  Following which patient started developing severe diarrhea.  Denies any vomiting.  Denies any abdominal pain but has been feeling generally weak.  Has not had any chest pain or shortness of breath.  ED Course: In the ER patient labs show severe hypokalemia with potassium less than 2.  Creatinine has increased from 1.9 in March 2019 to 3.2.  Patient was given fluid bolus and potassium replacement and admitted for acute on chronic renal failure secondary to diarrhea with severe hypokalemia.  Patient's blood sugar is also in the 500s.  Has taken NPH insulin yesterday morning at home as per the daughter but have not given further doses in the ER due to severe hypokalemia at this time.     Assessment & Plan:   Principal Problem:   Type 2 diabetes mellitus with hyperosmolar nonketotic hyperglycemia (HCC) Active Problems:   Hypokalemia   History of CVA (cerebrovascular accident) with associated mild right upper extremity hemiplegia   Pacemaker-[employer]   Dyslipidemia   Dehydration   Protein-calorie malnutrition, severe (HCC)   Metabolic acidosis   Acute on chronic kidney failure (HCC)   Uncontrolled type 2 diabetes mellitus with hyperglycemia (HCC)   ARF (acute renal failure) (HCC)   Diarrhea in adult patient   Hyperosmolar non-ketotic state in patient with type 2 diabetes mellitus ([place])   Acute renal failure superimposed on  stage 4 chronic kidney disease ([place])   Uncontrolled type 2 DM with hyperosmolar nonketotic hyperglycemia ([place])   #1 type 2 diabetes mellitus with hyperosmolar nonketotic hyperglycemia Patient had presented with severe hypokalemia, who presented with significant weakness, noted to have had some diarrhea.  Urinalysis done, worrisome for UTI.  Patient noted to have a anion gap of 16 with a blood glucose level of 504.  Bicarb of 29 on the first day of the hospitalization.  Chest x-ray obtained was negative for any acute infiltrates.  Cardiac enzymes which were cycled were minimally elevated but seem to be flattened.  Patient denies any chest pain.  2D echo obtained with results pending.  Hemoglobin A1c pending.  Hypoosmolar nonketotic hyperglycemia resolved and patient was transitioned to Lantus 10 units daily with sliding scale insulin.  Patient noted to have a CBG of 39 this morning and as such Lantus has been discontinued.  Continue sliding scale insulin.  Continue empiric IV antibiotics of Rocephin pending urine cultures.  Decrease IV fluids to 75 cc/h.  Check CBGs every 4 hours. Follow.  2.  Severe hypokalemia Likely secondary to diuretics.  Per family patient's diuretics were resumed by PCP recently.  Patient also noted to have some diarrhea per admission history and physical.  Potassium improving.  Magnesium level at 1.8.  Keep magnesium greater than 2.  Replete potassium.  Follow.  3.  Dehydration Gentle hydration with IV fluids.  Decrease IV fluids to 75 cc/h.  Patient with history of chronic kidney disease stage IV monitor closely for volume overload.  4.  Acute on chronic kidney disease  stage IV Likely secondary to a prerenal azotemia as patient noted to be significantly dry on examination.  Patient had presented with diarrhea.  Also noted per family that patient was resumed back on her diuretics with poor oral intake.  Renal ultrasound consistent with medical renal disease and negative for  obstruction/hydronephrosis.  Renal function improving with gentle hydration creatinine currently at 2.40 from 3.26 on admission.  Decrease IV fluids to 75 cc/h.  Monitor closely for volume overload.  5.  History of CVA Stable.  Continue Plavix for secondary stroke prophylaxis.  6.  Hypertension Continue home regimen of hydralazine, imdur, Lopressor.  7.  Vitamin D deficiency Significantly decreased vitamin D 25-hydroxy was 6.2.  Patient has been resumed back on home regimen of Os-Cal with vitamin D, vitamin D 50,000 units q. weekly.  Follow.  8.  Generalized weakness Likely secondary to dehydration and volume depletion.  PT/OT.  9.  Diarrhea Questionable etiology.  Seems to have resolved.  Follow.    10.  History of peripheral vascular disease/coronary artery disease Stable.  Continue aspirin, Plavix, Lopressor, hydralazine,imdur.  11.  History of sick sinus syndrome status post PPM  12.  Probable UTI Urine cultures pending.  Continue IV Rocephin.   DVT prophylaxis: Heparin Code Status: Full Family Communication: Updated patient and granddaughter at bedside. Disposition Plan: Remain in stepdown.  Once CBGs improve could transition to telemetry.  Likely home with home health.   Consultants:   None  Procedures:   Right IJ TL picc per Dr. Annamaria Boots 09/16/2017  2D echo 09/16/2017  Renal ultrasound 09/17/2017  Antimicrobials:   IV Rocephin 09/16/2017   Subjective: Patient sleeping.  Easily arousable.  Denies any chest pain or shortness of breath.  Patient noted to have a low CBG this morning of 39 and had to be given D50.  Blood glucose levels improved.  Per RN patient asymptomatic.   Objective: Vitals:   09/17/17 0700 09/17/17 0800 09/17/17 0836 09/17/17 0900  BP: 135/64 (!) 167/135 (!) 131/52 (!) 191/81  Pulse: (!) 59 63 66 75  Resp: 14 18  19   Temp:  98 F (36.7 C)    TempSrc:  Oral    SpO2: 96% 100%  99%  Weight:      Height:        Intake/Output Summary (Last  24 hours) at 09/17/2017 1011 Last data filed at 09/17/2017 0838 Gross per 24 hour  Intake 2990.63 ml  Output 900 ml  Net 2090.63 ml   Filed Weights   09/16/17 0547 09/16/17 1100 09/17/17 0500  Weight: 41.9 kg (92 lb 6 oz) 43.5 kg (95 lb 14.4 oz) 43.9 kg (96 lb 12.5 oz)    Examination:  General exam: Sleeping.   Respiratory system: Lungs clear to auscultation bilaterally anterior lung fields.  No wheezing.  No crackles.  No rhonchi.  Respiratory effort normal. Cardiovascular system: Regular rate and rhythm no murmurs rubs or gallops.  No JVD.  No lower extremity edema.   Gastrointestinal system: Abdomen is nontender, nondistended, soft, positive bowel sounds.  No rebound.  No guarding.   Central nervous system: Alert and oriented. No focal neurological deficits. Extremities: Symmetric 5 x 5 power. Skin: No rashes, lesions or ulcers Psychiatry: Judgement and insight appear poor to fair. Mood & affect appropriate.     Data Reviewed: I have personally reviewed following labs and imaging studies  CBC: Recent Labs  Lab 09/16/17 0113  WBC 5.7  NEUTROABS 4.5  HGB 13.7  HCT 38.1  MCV 89.2  PLT 616   Basic Metabolic Panel: Recent Labs  Lab 09/16/17 0636  09/16/17 1206 09/16/17 1659 09/16/17 2129 09/17/17 0241 09/17/17 0530 09/17/17 0535  NA 133*   < > 137 138 139 141  --  140  K 3.6   < > 3.0* 2.7* 3.0* 4.1  --  3.5  CL 88*   < > 93* 99 99 103  --  103  CO2 29   < > 29 29 29 29   --  30  GLUCOSE 504*   < > 339* 131* 163* 67*  --  80  BUN 45*   < > 42* 43* 42* 41*  --  39*  CREATININE 3.14*   < > 2.96* 2.68* 2.56* 2.54*  --  2.40*  CALCIUM 7.2*   < > 6.8* 6.9* 7.0* 7.2*  --  7.3*  MG 1.7  --   --   --   --   --  1.8  --    < > = values in this interval not displayed.   GFR: Estimated Creatinine Clearance: 12.7 mL/min (A) (by C-G formula based on SCr of 2.4 mg/dL (H)). Liver Function Tests: Recent Labs  Lab 09/16/17 0636  AST 17  ALT 13  ALKPHOS 107  BILITOT 0.4    PROT 5.6*  ALBUMIN 2.1*   No results for input(s): LIPASE, AMYLASE in the last 168 hours. No results for input(s): AMMONIA in the last 168 hours. Coagulation Profile: No results for input(s): INR, PROTIME in the last 168 hours. Cardiac Enzymes: Recent Labs  Lab 09/16/17 0636 09/16/17 0856 09/16/17 1659 09/16/17 2129  TROPONINI 0.09* 0.08* 0.07* 0.06*   BNP (last 3 results) No results for input(s): PROBNP in the last 8760 hours. HbA1C: No results for input(s): HGBA1C in the last 72 hours. CBG: Recent Labs  Lab 09/17/17 0349 09/17/17 0419 09/17/17 0726 09/17/17 0751 09/17/17 0806  GLUCAP 37* 105* 39* 58* 73   Lipid Profile: No results for input(s): CHOL, HDL, LDLCALC, TRIG, CHOLHDL, LDLDIRECT in the last 72 hours. Thyroid Function Tests: No results for input(s): TSH, T4TOTAL, FREET4, T3FREE, THYROIDAB in the last 72 hours. Anemia Panel: No results for input(s): VITAMINB12, FOLATE, FERRITIN, TIBC, IRON, RETICCTPCT in the last 72 hours. Sepsis Labs: No results for input(s): PROCALCITON, LATICACIDVEN in the last 168 hours.  Recent Results (from the past 240 hour(s))  MRSA PCR Screening     Status: None   Collection Time: 09/16/17 11:13 AM  Result Value Ref Range Status   MRSA by PCR NEGATIVE NEGATIVE Final    Comment:        The GeneXpert MRSA Assay (FDA approved for NASAL specimens only), is one component of a comprehensive MRSA colonization surveillance program. It is not intended to diagnose MRSA infection nor to guide or monitor treatment for MRSA infections. Performed at [facility], [place] [address] [place], [place] [address]          Radiology Studies: Ir Cyndy Freeze Guide Cv Line Right  Result Date: 09/16/2017 INDICATION: Diabetic ketoacidosis, limited access EXAM: ULTRASOUND AND FLUOROSCOPIC RIGHT IJ TRIPLE-LUMEN PICC LINE MEDICATIONS: 1% lidocaine local ANESTHESIA/SEDATION: Moderate Sedation Time: None. The patient's level of  consciousness and vital signs were monitored continuously by radiology nursing throughout the procedure under my direct supervision. FLUOROSCOPY TIME:  Fluoroscopy Time: 12 seconds (1 mGy). COMPLICATIONS: None immediate. PROCEDURE: Informed written consent was obtained from the patient's power of attorney after a thorough discussion of the procedural risks, benefits  and alternatives. All questions were addressed. Maximal Sterile Barrier Technique was utilized including caps, mask, sterile gowns, sterile gloves, sterile drape, hand hygiene and skin antiseptic. A timeout was performed prior to the initiation of the procedure. Under sterile conditions and local anesthesia, ultrasound micropuncture access performed of the right internal jugular vein. Images obtained for documentation of the patent right internal jugular vein. Guidewire advanced easily followed by the peel-away sheath. Measurements obtained for the appropriate length. Over the guidewire, a 15 cm 6 French triple-lumen PICC line was advanced with the tip position at the SVC RA junction. Position confirmed with fluoroscopy. Images obtained for documentation. Blood aspirated easily followed by saline heparin flushes. Appropriate volume and strength of heparin instilled in all lumens followed by external caps. Catheter secured with Ethilon sutures and a sterile dressing. No immediate complication. Patient tolerated the procedure well. IMPRESSION: Successful ultrasound and fluoroscopic right IJ triple-lumen 6 French PICC line. Tip SVC RA junction. Ready for use. Electronically Signed   By: Jerilynn Mages.  Shick M.D.   On: 09/16/2017 10:55   Ir US Guide Vasc Access Right  Result Date: 09/16/2017 INDICATION: Diabetic ketoacidosis, limited access EXAM: ULTRASOUND AND FLUOROSCOPIC RIGHT IJ TRIPLE-LUMEN PICC LINE MEDICATIONS: 1% lidocaine local ANESTHESIA/SEDATION: Moderate Sedation Time: None. The patient's level of consciousness and vital signs were monitored  continuously by radiology nursing throughout the procedure under my direct supervision. FLUOROSCOPY TIME:  Fluoroscopy Time: 12 seconds (1 mGy). COMPLICATIONS: None immediate. PROCEDURE: Informed written consent was obtained from the patient's power of attorney after a thorough discussion of the procedural risks, benefits and alternatives. All questions were addressed. Maximal Sterile Barrier Technique was utilized including caps, mask, sterile gowns, sterile gloves, sterile drape, hand hygiene and skin antiseptic. A timeout was performed prior to the initiation of the procedure. Under sterile conditions and local anesthesia, ultrasound micropuncture access performed of the right internal jugular vein. Images obtained for documentation of the patent right internal jugular vein. Guidewire advanced easily followed by the peel-away sheath. Measurements obtained for the appropriate length. Over the guidewire, a 15 cm 6 French triple-lumen PICC line was advanced with the tip position at the SVC RA junction. Position confirmed with fluoroscopy. Images obtained for documentation. Blood aspirated easily followed by saline heparin flushes. Appropriate volume and strength of heparin instilled in all lumens followed by external caps. Catheter secured with Ethilon sutures and a sterile dressing. No immediate complication. Patient tolerated the procedure well. IMPRESSION: Successful ultrasound and fluoroscopic right IJ triple-lumen 6 French PICC line. Tip SVC RA junction. Ready for use. Electronically Signed   By: Jerilynn Mages.  Shick M.D.   On: 09/16/2017 10:55        Scheduled Meds: . aspirin EC  81 mg Oral Daily  . calcium-vitamin D  1 tablet Oral QID  . Chlorhexidine Gluconate Cloth  6 each Topical Daily  . clopidogrel  75 mg Oral Daily  . feeding supplement (GLUCERNA SHAKE)  237 mL Oral TID BM  . heparin  5,000 Units Subcutaneous Q8H  . hydrALAZINE  10 mg Oral TID  . insulin aspart  0-15 Units Subcutaneous Q4H  .  isosorbide mononitrate  120 mg Oral Daily  . mouth rinse  15 mL Mouth Rinse BID  . metoprolol tartrate  25 mg Oral BID  . omega-3 acid ethyl esters  1 g Oral Daily  . sodium chloride flush  10-40 mL Intracatheter Q12H  . Vitamin D (Ergocalciferol)  50,000 Units Oral Q7 days   Continuous Infusions: . sodium chloride 75  mL/hr at 09/17/17 0836  . cefTRIAXone (ROCEPHIN)  IV Stopped (09/17/17 0908)  . magnesium sulfate 1 - 4 g bolus IVPB       LOS: 1 day    Time spent: 40 minutes    Irine Seal, MD [facility] Pager [phone]  If 7PM-7AM, please contact night-coverage [email or web address] Password [facility] 09/17/2017, 10:11 AM

## 2017-09-17 NOTE — Progress Notes (Signed)
Hypoglycemic Event  CBG: 39  Treatment: 4 oz of orange juice with 2 packets of sugar  Symptoms: none   Follow-up CBG: Time:755 CBG Result:59  Possible Reasons for Event: not eating   Comments/MD notified:will check sugar again in 15 minutes     Carmen [name]

## 2017-09-18 LAB — BASIC METABOLIC PANEL
Anion gap: 6 (ref 5–15)
BUN: 37 mg/dL — ABNORMAL HIGH (ref 8–23)
CALCIUM: 7.7 mg/dL — AB (ref 8.9–10.3)
CO2: 26 mmol/L (ref 22–32)
CREATININE: 2.37 mg/dL — AB (ref 0.44–1.00)
Chloride: 107 mmol/L (ref 98–111)
GFR calc Af Amer: 21 mL/min — ABNORMAL LOW (ref >60)
GFR, EST NON AFRICAN AMERICAN: 18 mL/min — AB (ref >60)
GLUCOSE: 92 mg/dL (ref 70–99)
Potassium: 4 mmol/L (ref 3.5–5.1)
SODIUM: 139 mmol/L (ref 135–145)

## 2017-09-18 LAB — CBC
HCT: 34.6 % — ABNORMAL LOW (ref 36.0–46.0)
HEMOGLOBIN: 12.2 g/dL (ref 12.0–15.0)
MCH: 31.9 pg (ref 26.0–34.0)
MCHC: 35.3 g/dL (ref 30.0–36.0)
MCV: 90.3 fL (ref 78.0–100.0)
PLATELETS: 155 10*3/uL (ref 150–400)
RBC: 3.83 MIL/uL — ABNORMAL LOW (ref 3.87–5.11)
RDW: 13.1 % (ref 11.5–15.5)
WBC: 5.6 10*3/uL (ref 4.0–10.5)

## 2017-09-18 LAB — MAGNESIUM: MAGNESIUM: 2.8 mg/dL — AB (ref 1.7–2.4)

## 2017-09-18 LAB — GLUCOSE, CAPILLARY
GLUCOSE-CAPILLARY: 97 mg/dL (ref 70–99)
GLUCOSE-CAPILLARY: 194 mg/dL — AB (ref 70–99)
GLUCOSE-CAPILLARY: 126 mg/dL — AB (ref 70–99)
Glucose-Capillary: 110 mg/dL — ABNORMAL HIGH (ref 70–99)
Glucose-Capillary: 225 mg/dL — ABNORMAL HIGH (ref 70–99)
Glucose-Capillary: 83 mg/dL (ref 70–99)

## 2017-09-18 MED ORDER — HYDRALAZINE HCL 20 MG/ML IJ SOLN
10.0000 mg | Freq: Four times a day (QID) | INTRAMUSCULAR | Status: DC | PRN
  Administered 2017-09-18 – 2017-09-19 (×2): 10 mg via INTRAVENOUS
  Filled 2017-09-18 (×4): qty 1

## 2017-09-18 NOTE — Evaluation (Signed)
Physical Therapy Evaluation Patient Details Name: Carmen [name] MRN: [id number] DOB: January 11, 1934 Today's Date: 09/18/2017   History of Present Illness  84 y.o. female admitted with DM with nonketotic hyperglycemia, hypokalemia, weakness, and diarrhea. Hx of CVA with R sided weakness, PAD, SSS, pacemaker, CKD.     Clinical Impression  On eval, pt required Min assist for mobility. She walked ~25 feet with a RW. Pt presents with general weakness, decreased activity tolerance, and impaired gait and balance. Family was present during session. Discussed d/c plan-pt will return home with family assisting as needed. Family is agreeable to [place] f/u. Will plan to follow pt during hospital stay.     Follow Up Recommendations Home health PT;Supervision/Assistance - 24 hour    Equipment Recommendations  None recommended by PT    Recommendations for Other Services       Precautions / Restrictions Precautions Precautions: Fall Restrictions Weight Bearing Restrictions: No      Mobility  Bed Mobility Overal bed mobility: Needs Assistance Bed Mobility: Supine to Sit     Supine to sit: Min guard;HOB elevated     General bed mobility comments: cues for how to self assist with scooting to EOB. close guard for safety, management of lines  Transfers Overall transfer level: Needs assistance Equipment used: Rolling walker (2 wheeled) Transfers: Sit to/from Stand Sit to Stand: Min assist         General transfer comment: Assist to rise, stabilize, control descent. VCs safety, technique, hand placement.   Ambulation/Gait Ambulation/Gait assistance: Min assist Gait Distance (Feet): 25 Feet Assistive device: Rolling walker (2 wheeled) Gait Pattern/deviations: Step-through pattern;Decreased stride length     General Gait Details: assist to stabilize pt throughout distance. Pt denied pain, lightheadedness. Slow gait speed.   Stairs            Wheelchair Mobility    Modified  Rankin (Stroke Patients Only)       Balance Overall balance assessment: Needs assistance                                           Pertinent Vitals/Pain Pain Assessment: No/denies pain    Home Living Family/patient expects to be discharged to:: Private residence Living Arrangements: Spouse/significant other;Children Available Help at Discharge: Available 24 hours/day Type of Home: House Home Access: Stairs to enter   [employer] of Steps: 2 Home Layout: One level Home Equipment: Environmental consultant - 2 wheels;Shower seat;Bedside commode;Cane - quad      Prior Function Level of Independence: Needs assistance   Gait / Transfers Assistance Needed: holds to furniture mostly for ambulation at home.   ADL's / Homemaking Assistance Needed: family assists with showering and LB dressing. Family assists with meal prep per previous encounter. Some of home information from previous encounter.         Hand Dominance   Dominant Hand: Left(has become left handed from stroke in past that affected RUE)    Extremity/Trunk Assessment   Upper Extremity Assessment Upper Extremity Assessment: Defer to OT evaluation RUE Deficits / Details: residual deficits from stroke LUE Deficits / Details: generalized weakness.     Lower Extremity Assessment Lower Extremity Assessment: Generalized weakness    Cervical / Trunk Assessment Cervical / Trunk Assessment: Kyphotic  Communication   Communication: No difficulties  Cognition Arousal/Alertness: Awake/alert Behavior During Therapy: WFL for tasks assessed/performed Overall Cognitive Status: Within Functional Limits  for tasks assessed                                 General Comments: difficulty to understand speech at times. Family intervenes quite a bit      General Comments      Exercises     Assessment/Plan    PT Assessment Patient needs continued PT services  PT Problem List Decreased  balance;Decreased strength;Decreased activity tolerance;Decreased mobility;Decreased knowledge of use of DME       PT Treatment Interventions DME instruction;Gait training;Functional mobility training;Therapeutic activities;Balance training;Patient/family education;Therapeutic exercise    PT Goals (Current goals can be found in the Care Plan section)  Acute Rehab PT Goals Patient Stated Goal: wants to go home. PT Goal Formulation: With patient/family Time For Goal Achievement: 10/02/17 Potential to Achieve Goals: Good    Frequency Min 3X/week   Barriers to discharge        Co-evaluation PT/OT/SLP Co-Evaluation/Treatment: Yes Reason for Co-Treatment: For patient/therapist safety PT goals addressed during session: Mobility/safety with mobility;Proper use of DME OT goals addressed during session: ADL's and self-care;Strengthening/ROM       AM-PAC PT "6 Clicks" Daily Activity  Outcome Measure Difficulty turning over in bed (including adjusting bedclothes, sheets and blankets)?: A Little Difficulty moving from lying on back to sitting on the side of the bed? : A Little Difficulty sitting down on and standing up from a chair with arms (e.g., wheelchair, bedside commode, etc,.)?: Unable Help needed moving to and from a bed to chair (including a wheelchair)?: A Little Help needed walking in hospital room?: A Little Help needed climbing 3-5 steps with a railing? : A Little 6 Click Score: 16    End of Session Equipment Utilized During Treatment: Gait belt Activity Tolerance: Patient tolerated treatment well Patient left: in chair;with call bell/phone within reach;with family/visitor present   PT Visit Diagnosis: Muscle weakness (generalized) (M62.81);Difficulty in walking, not elsewhere classified (R26.2);Unsteadiness on feet (R26.81)    Time: 5284-1324 PT Time Calculation (min) (ACUTE ONLY): 23 min   Charges:   PT Evaluation $PT Eval Moderate Complexity: 1 Mod     PT G  Codes:         Weston Anna, MPT Pager: [phone]

## 2017-09-18 NOTE — Evaluation (Signed)
Occupational Therapy Evaluation Patient Details Name: Carmen [name] MRN: [id number] DOB: [birth year] Today's Date: 09/18/2017    History of Present Illness Carmen [name] is a 84 y.o. female with history of stroke with right-sided weakness, peripheral artery disease status post angioplasty, diastolic dysfunction, sick sinus syndrome status post pacemaker placement, chronic kidney disease stage IV was brought to the ER with complaints of having persistent diarrhea   Clinical Impression   Pt up for functional mobility with walker and then to sit up in the recliner this visit. Pt tolerated all activity well. Family present in room and supportive. Pt will benefit from continued OT to progress ADL independence for return home. Family states they have received HH and an aide in the past.     Follow Up Recommendations  Home health OT;Supervision/Assistance - 24 hour;Other (comment)    Equipment Recommendations  None recommended by OT    Recommendations for Other Services       Precautions / Restrictions Precautions Precautions: Fall Restrictions Weight Bearing Restrictions: No      Mobility Bed Mobility Overal bed mobility: Needs Assistance Bed Mobility: Supine to Sit     Supine to sit: Min guard     General bed mobility comments: cues for how to self assist with scooting to EOB.   Transfers Overall transfer level: Needs assistance Equipment used: Rolling walker (2 wheeled) Transfers: Sit to/from Stand Sit to Stand: Min assist         General transfer comment: cues for hand placement and light balance support to rise to standing.     Balance Overall balance assessment: Needs assistance                                         ADL either performed or assessed with clinical judgement   ADL Overall ADL's : Needs assistance/impaired Eating/Feeding: Set up;Bed level   Grooming: Wash/dry face;Sitting;Min guard   Upper Body Bathing: Minimal  assistance;Sitting   Lower Body Bathing: Moderate assistance;Sit to/from stand   Upper Body Dressing : Moderate assistance;Sitting Upper Body Dressing Details (indicate cue type and reason): assist with gown to manage lines Lower Body Dressing: Total assistance;Bed level Lower Body Dressing Details (indicate cue type and reason): with socks from supine Toilet Transfer: Minimal assistance;Ambulation;RW   Toileting- Clothing Manipulation and Hygiene: Total assistance;Sit to/from stand Toileting - Clothing Manipulation Details (indicate cue type and reason): required use of UEs on walker today for support       General ADL Comments: Pt's family present for session. Pt up for functional mobility with walker just outside of the door and then back in room to sit up in the chair. Pt's family state they assist her with LB dressing at home. She mostly furniture walks for support at home. Min cues for safe walker use today.      Vision Patient Visual Report: No change from baseline       Perception     Praxis      Pertinent Vitals/Pain Pain Assessment: No/denies pain     Hand Dominance Left(has become left handed from stroke in past that affected RUE)   Extremity/Trunk Assessment Upper Extremity Assessment Upper Extremity Assessment: RUE deficits/detail;LUE deficits/detail;Generalized weakness RUE Deficits / Details: residual deficits from stroke LUE Deficits / Details: generalized weakness.            Communication Communication Communication: No difficulties   Cognition  Arousal/Alertness: Awake/alert Behavior During Therapy: WFL for tasks assessed/performed Overall Cognitive Status: Within Functional Limits for tasks assessed                                     General Comments       Exercises     Shoulder Instructions      Home Living Family/patient expects to be discharged to:: Private residence Living Arrangements: Spouse/significant  other;Children Available Help at Discharge: Available 24 hours/day Type of Home: House Home Access: Stairs to enter [employer] of Steps: 2   Home Layout: One level     Bathroom Shower/Tub: Teacher, early years/pre: Standard     Home Equipment: Environmental consultant - 2 wheels;Shower seat;Bedside commode;Cane - quad          Prior Functioning/Environment Level of Independence: Needs assistance  Gait / Transfers Assistance Needed: holds to furniture mostly for ambulation at home.  ADL's / Homemaking Assistance Needed: family assists with showering and LB dressing. Family assists with meal prep per previous encounter. Some of home information from previous encounter.             OT Problem List: Decreased strength;Decreased knowledge of use of DME or AE      OT Treatment/Interventions: Self-care/ADL training;DME and/or AE instruction;Therapeutic activities;Patient/family education    OT Goals(Current goals can be found in the care plan section) Acute Rehab OT Goals Patient Stated Goal: wants to go home. OT Goal Formulation: With patient/family Time For Goal Achievement: 10/02/17 Potential to Achieve Goals: Good  OT Frequency: Min 2X/week   Barriers to D/C:            Co-evaluation PT/OT/SLP Co-Evaluation/Treatment: Yes Reason for Co-Treatment: For patient/therapist safety   OT goals addressed during session: ADL's and self-care;Strengthening/ROM      AM-PAC PT "6 Clicks" Daily Activity     Outcome Measure Help from another person eating meals?: A Little Help from another person taking care of personal grooming?: A Little Help from another person toileting, which includes using toliet, bedpan, or urinal?: A Little Help from another person bathing (including washing, rinsing, drying)?: A Little Help from another person to put on and taking off regular upper body clothing?: A Little Help from another person to put on and taking off regular lower body  clothing?: A Little 6 Click Score: 18   End of Session Equipment Utilized During Treatment: Gait belt;Rolling walker  Activity Tolerance: Patient tolerated treatment well Patient left: in chair;with call bell/phone within reach;with family/visitor present  OT Visit Diagnosis: Unsteadiness on feet (R26.81);Muscle weakness (generalized) (M62.81)                Time: 1610-9604 OT Time Calculation (min): 24 min Charges:  OT General Charges $OT Visit: 1 Visit OT Evaluation $OT Eval Low Complexity: 1 Low G-Codes:       Jae Dire Duffy Dantonio 09/18/2017, 12:22 PM

## 2017-09-18 NOTE — Progress Notes (Signed)
PROGRESS NOTE    Carmen [name]  ZOX:[id number] DOB: Jan 30, 1934 DOA: 09/16/2017 PCP: Glendale Chard, MD    Brief Narrative:  Carmen [name] is a 84 y.o. female with history of stroke with right-sided weakness, peripheral artery disease status post angioplasty, diastolic dysfunction, sick sinus syndrome status post pacemaker placement, chronic kidney disease stage IV was brought to the ER with complaints of having persistent diarrhea since morning.  Patient's daughter states that patient had some hemorrhoidal problem 2 days ago and had hemorrhoid cream placed.  Following which patient started developing severe diarrhea.  Denies any vomiting.  Denies any abdominal pain but has been feeling generally weak.  Has not had any chest pain or shortness of breath.  ED Course: In the ER patient labs show severe hypokalemia with potassium less than 2.  Creatinine has increased from 1.9 in March 2019 to 3.2.  Patient was given fluid bolus and potassium replacement and admitted for acute on chronic renal failure secondary to diarrhea with severe hypokalemia.  Patient's blood sugar is also in the 500s.  Has taken NPH insulin yesterday morning at home as per the daughter but have not given further doses in the ER due to severe hypokalemia at this time.     Assessment & Plan:   Principal Problem:   Type 2 diabetes mellitus with hyperosmolar nonketotic hyperglycemia (HCC) Active Problems:   Hypokalemia   History of CVA (cerebrovascular accident) with associated mild right upper extremity hemiplegia   Pacemaker-[employer]   Dyslipidemia   Dehydration   Protein-calorie malnutrition, severe (HCC)   Metabolic acidosis   Acute on chronic kidney failure (HCC)   Uncontrolled type 2 diabetes mellitus with hyperglycemia (HCC)   ARF (acute renal failure) (HCC)   Diarrhea in adult patient   Hyperosmolar non-ketotic state in patient with type 2 diabetes mellitus ([place])   Acute renal failure superimposed on  stage 4 chronic kidney disease ([place])   Uncontrolled type 2 DM with hyperosmolar nonketotic hyperglycemia ([place])   #1 type 2 diabetes mellitus with hyperosmolar nonketotic hyperglycemia Patient had presented with severe hypokalemia, who presented with significant weakness, noted to have had some diarrhea.  Urinalysis done, worrisome for UTI.  Patient noted to have a anion gap of 16 with a blood glucose level of 504.  Bicarb of 29 on the first day of the hospitalization.  Chest x-ray obtained was negative for any acute infiltrates.  Cardiac enzymes which were cycled were minimally elevated but seem to be flattened.  Patient denies any chest pain.  2D echo obtained with results pending.  Hemoglobin A1c pending.  Hypoosmolar nonketotic hyperglycemia resolved and patient was transitioned to Lantus 10 units daily with sliding scale insulin.  Patient noted to have a CBG of 39 the morning of 09/17/2017, and as such Lantus has been discontinued.  CBGs currently at 97-194.  Hemoglobin A1c 16.2.  Continue sliding scale insulin.  Discontinue empiric IV antibiotics after today's dose.  Continue gentle hydration.  Follow.  2.  Severe hypokalemia Likely secondary to diuretics.  Per family patient's diuretics were resumed by PCP recently.  Patient also noted to have some diarrhea per admission history and physical.  Potassium improved.  Magnesium level at 2.8.  Keep magnesium greater than 2.  Repleted potassium.  Follow.  3.  Dehydration Improving with hydration. Patient with history of chronic kidney disease stage IV monitor closely for volume overload.  4.  Acute on chronic kidney disease stage IV Likely secondary to a prerenal azotemia as  patient noted to be significantly dry on examination.  Patient had presented with diarrhea.  Also noted per family that patient was resumed back on her diuretics with poor oral intake.  Renal ultrasound consistent with medical renal disease and negative for  obstruction/hydronephrosis.  Renal function improving with gentle hydration creatinine currently at 2.37 from 2.40 from 3.26 on admission.  Current rate of IV fluids at 100 cc/h.  Monitor closely for volume overload.    5.  History of CVA Stable.  Continue Plavix for secondary stroke prophylaxis.  6.  Hypertension Stable.  Continue current regimen of hydralazine, imdur, Lopressor.  7.  Vitamin D deficiency Significantly decreased vitamin D 25-hydroxy was 6.2.  Patient has been resumed back on home regimen of Os-Cal with vitamin D, vitamin D 50,000 units q. weekly.  Follow.  8.  Generalized weakness Likely secondary to dehydration and volume depletion.  Improving with hydration.  PT/OT.  9.  Diarrhea Questionable etiology.  Patient had 2 loose stools yesterday and none since then.  Discontinue enteric precautions and discontinue stool studies.  10.  History of peripheral vascular disease/coronary artery disease Continue aspirin, Plavix, Lopressor, hydralazine,imdur.  11.  History of sick sinus syndrome status post PPM  12.  Probable UTI Urine cultures negative.  Discontinue IV Rocephin after today's dose patient would have had 3 days of IV antibiotics.     DVT prophylaxis: Heparin Code Status: Full Family Communication: Updated patient and daughter and granddaughter at bedside. Disposition Plan: Transfer to telemetry.     Consultants:   None  Procedures:   Right IJ TL picc per Dr. Annamaria Boots 09/16/2017  2D echo 09/16/2017  Renal ultrasound 09/17/2017  Antimicrobials:   IV Rocephin 09/16/2017>>>> 09/18/2017   Subjective: Patient alert.  Denies any chest pain or shortness of breath.  States she is feeling better.  Tolerating oral intake.  CBGs improved.  No further stool after 2 loose stools yesterday.  Easily arousable.    Objective: Vitals:   09/18/17 0300 09/18/17 0400 09/18/17 0500 09/18/17 0800  BP: (!) 146/55   128/68  Pulse:    60  Resp: 18   16  Temp:  98.3 F  (36.8 C)  98.2 F (36.8 C)  TempSrc:  Oral  Oral  SpO2:    97%  Weight:   48.2 kg (106 lb 4.2 oz)   Height:        Intake/Output Summary (Last 24 hours) at 09/18/2017 1048 Last data filed at 09/18/2017 1044 Gross per 24 hour  Intake 2405 ml  Output 450 ml  Net 1955 ml   Filed Weights   09/16/17 1100 09/17/17 0500 09/18/17 0500  Weight: 43.5 kg (95 lb 14.4 oz) 43.9 kg (96 lb 12.5 oz) 48.2 kg (106 lb 4.2 oz)    Examination:  General exam: NAD  Respiratory system: CTAB.  No wheezes, no crackles, no rhonchi.  Normal respiratory effort.   Cardiovascular system: RRR no murmurs rubs or gallops.  No JVD.  No lower extremity edema.   Gastrointestinal system: Abdomen is soft, nontender, nondistended, positive bowel sounds.  No rebound.  No guarding.  Central nervous system: Alert and oriented. No focal neurological deficits. Extremities: Symmetric 5 x 5 power. Skin: No rashes, lesions or ulcers Psychiatry: Judgement and insight appear fair. Mood & affect appropriate.     Data Reviewed: I have personally reviewed following labs and imaging studies  CBC: Recent Labs  Lab 09/16/17 0113 09/18/17 0552  WBC 5.7 5.6  NEUTROABS 4.5  --  HGB 13.7 12.2  HCT 38.1 34.6*  MCV 89.2 90.3  PLT 183 683   Basic Metabolic Panel: Recent Labs  Lab 09/16/17 0636  09/16/17 1659 09/16/17 2129 09/17/17 0241 09/17/17 0530 09/17/17 0535 09/18/17 0552  NA 133*   < > 138 139 141  --  140 139  K 3.6   < > 2.7* 3.0* 4.1  --  3.5 4.0  CL 88*   < > 99 99 103  --  103 107  CO2 29   < > 29 29 29   --  30 26  GLUCOSE 504*   < > 131* 163* 67*  --  80 92  BUN 45*   < > 43* 42* 41*  --  39* 37*  CREATININE 3.14*   < > 2.68* 2.56* 2.54*  --  2.40* 2.37*  CALCIUM 7.2*   < > 6.9* 7.0* 7.2*  --  7.3* 7.7*  MG 1.7  --   --   --   --  1.8  --  2.8*   < > = values in this interval not displayed.   GFR: Estimated Creatinine Clearance: 14.2 mL/min (A) (by C-G formula based on SCr of 2.37 mg/dL (H)). Liver  Function Tests: Recent Labs  Lab 09/16/17 0636  AST 17  ALT 13  ALKPHOS 107  BILITOT 0.4  PROT 5.6*  ALBUMIN 2.1*   No results for input(s): LIPASE, AMYLASE in the last 168 hours. No results for input(s): AMMONIA in the last 168 hours. Coagulation Profile: No results for input(s): INR, PROTIME in the last 168 hours. Cardiac Enzymes: Recent Labs  Lab 09/16/17 0636 09/16/17 0856 09/16/17 1659 09/16/17 2129  TROPONINI 0.09* 0.08* 0.07* 0.06*   BNP (last 3 results) No results for input(s): PROBNP in the last 8760 hours. HbA1C: Recent Labs    09/17/17 0535  HGBA1C 16.2*   CBG: Recent Labs  Lab 09/17/17 1633 09/17/17 1924 09/17/17 2336 09/18/17 0345 09/18/17 0726  GLUCAP 227* 187* 210* 110* 97   Lipid Profile: No results for input(s): CHOL, HDL, LDLCALC, TRIG, CHOLHDL, LDLDIRECT in the last 72 hours. Thyroid Function Tests: No results for input(s): TSH, T4TOTAL, FREET4, T3FREE, THYROIDAB in the last 72 hours. Anemia Panel: No results for input(s): VITAMINB12, FOLATE, FERRITIN, TIBC, IRON, RETICCTPCT in the last 72 hours. Sepsis Labs: No results for input(s): PROCALCITON, LATICACIDVEN in the last 168 hours.  Recent Results (from the past 240 hour(s))  MRSA PCR Screening     Status: None   Collection Time: 09/16/17 11:13 AM  Result Value Ref Range Status   MRSA by PCR NEGATIVE NEGATIVE Final    Comment:        The GeneXpert MRSA Assay (FDA approved for NASAL specimens only), is one component of a comprehensive MRSA colonization surveillance program. It is not intended to diagnose MRSA infection nor to guide or monitor treatment for MRSA infections. Performed at [facility], [place] [address], [place], [place] [address]   Culture, Urine     Status: None   Collection Time: 09/16/17 12:19 PM  Result Value Ref Range Status   Specimen Description   Final    URINE, CATHETERIZED Performed at [place] [address] [place], [place] [address]    Special Requests   Final    NONE Performed at [facility], [place] [address], [place], [place] [address]    Culture   Final    NO GROWTH Performed at [facility], [place] [address], [place], [place] [address]    Report Status 09/17/2017 FINAL  Final         Radiology Studies: US Renal  Result Date: 09/17/2017 CLINICAL DATA:  Acute on chronic renal failure and history of diabetes. History of chronic kidney disease, stage IV. EXAM: RENAL / URINARY TRACT ULTRASOUND COMPLETE COMPARISON:  01/15/2016 FINDINGS: Right Kidney: Length: 9.2 cm. Stable echogenic appearance with small 1.6 cm upper pole simple cyst. No hydronephrosis. Left Kidney: Length: 9.7 cm.  Stable echogenic appearance without hydronephrosis. Bladder: The bladder is completely decompressed by a Foley catheter. IMPRESSION: The kidneys are stable in size and shows stable echogenic appearance consistent with underlying chronic kidney disease. No evidence of renal obstruction. Electronically Signed   By: Aletta Edouard M.D.   On: 09/17/2017 10:35        Scheduled Meds: . acetaminophen  500 mg Oral TID  . aspirin EC  81 mg Oral Daily  . calcium-vitamin D  1 tablet Oral QID  . Chlorhexidine Gluconate Cloth  6 each Topical Daily  . clopidogrel  75 mg Oral Daily  . feeding supplement (GLUCERNA SHAKE)  237 mL Oral TID BM  . heparin  5,000 Units Subcutaneous Q8H  . hydrALAZINE  10 mg Oral TID  . insulin aspart  0-15 Units Subcutaneous Q4H  . isosorbide mononitrate  120 mg Oral Daily  . mouth rinse  15 mL Mouth Rinse BID  . metoprolol tartrate  25 mg Oral BID  . omega-3 acid ethyl esters  1 g Oral Daily  . sodium chloride flush  10-40 mL Intracatheter Q12H  . Vitamin D (Ergocalciferol)  50,000 Units Oral Q7 days   Continuous Infusions: . sodium chloride 100 mL/hr at 09/18/17 0252  . cefTRIAXone (ROCEPHIN)  IV Stopped (09/17/17 0908)     LOS: 2 days    Time spent: 35  minutes    Irine Seal, MD [facility] Pager [phone]  If 7PM-7AM, please contact night-coverage [email or web address] Password [facility] 09/18/2017, 10:48 AM

## 2017-09-19 LAB — ECHOCARDIOGRAM COMPLETE
Height: 62 in
Weight: 1534.4 oz

## 2017-09-19 LAB — CBC
HCT: 36.3 % (ref 36.0–46.0)
HEMOGLOBIN: 12.3 g/dL (ref 12.0–15.0)
MCH: 31.1 pg (ref 26.0–34.0)
MCHC: 33.9 g/dL (ref 30.0–36.0)
MCV: 91.9 fL (ref 78.0–100.0)
Platelets: 161 10*3/uL (ref 150–400)
RBC: 3.95 MIL/uL (ref 3.87–5.11)
RDW: 13.3 % (ref 11.5–15.5)
WBC: 6.2 10*3/uL (ref 4.0–10.5)

## 2017-09-19 LAB — HEMOGLOBIN A1C: Mean Plasma Glucose: >398 mg/dL

## 2017-09-19 LAB — BASIC METABOLIC PANEL
Anion gap: 6 (ref 5–15)
BUN: 39 mg/dL — ABNORMAL HIGH (ref 8–23)
CALCIUM: 7.7 mg/dL — AB (ref 8.9–10.3)
CO2: 25 mmol/L (ref 22–32)
CREATININE: 2.24 mg/dL — AB (ref 0.44–1.00)
Chloride: 109 mmol/L (ref 98–111)
GFR calc non Af Amer: 19 mL/min — ABNORMAL LOW (ref >60)
GFR, EST AFRICAN AMERICAN: 22 mL/min — AB (ref >60)
Glucose, Bld: 68 mg/dL — ABNORMAL LOW (ref 70–99)
Potassium: 4.2 mmol/L (ref 3.5–5.1)
SODIUM: 140 mmol/L (ref 135–145)

## 2017-09-19 LAB — GLUCOSE, CAPILLARY
GLUCOSE-CAPILLARY: 64 mg/dL — AB (ref 70–99)
GLUCOSE-CAPILLARY: 98 mg/dL (ref 70–99)
Glucose-Capillary: 153 mg/dL — ABNORMAL HIGH (ref 70–99)
Glucose-Capillary: 127 mg/dL — ABNORMAL HIGH (ref 70–99)

## 2017-09-19 MED ORDER — BISACODYL 5 MG PO TBEC: 5.0000 mg | DELAYED_RELEASE_TABLET | Freq: Every day | ORAL | Status: DC | PRN

## 2017-09-19 MED ORDER — GLUCERNA SHAKE PO LIQD: 237.0000 mL | Freq: Three times a day (TID) | ORAL | 0 refills | Status: AC

## 2017-09-19 MED ORDER — BISACODYL 5 MG PO TBEC: 5.0000 mg | DELAYED_RELEASE_TABLET | Freq: Every day | ORAL | 0 refills | Status: AC | PRN

## 2017-09-19 NOTE — Discharge Summary (Signed)
Physician Discharge Summary  Carmen [name] YWV:[id number] DOB: 28-Nov-1933 DOA: 09/16/2017  PCP: Glendale Chard, MD  Admit date: 09/16/2017 Discharge date: 09/19/2017  Time spent: 60 minutes  Recommendations for Outpatient Follow-up:  1. Follow-up with Glendale Chard, MD in 1 week.  On follow-up patient will need a basic metabolic profile done to follow-up on electrolytes and renal function.  Patient diuretics were discontinued on discharge and will need to be followed up upon per PCP to determine as to whether this needs to be resumed.  Patient's diabetes is also need to be reassessed.  2D echo results will need to be followed up upon.   Discharge Diagnoses:  Principal Problem:   Type 2 diabetes mellitus with hyperosmolar nonketotic hyperglycemia (HCC) Active Problems:   Hypokalemia   History of CVA (cerebrovascular accident) with associated mild right upper extremity hemiplegia   Pacemaker-[employer]   Dyslipidemia   Dehydration   Protein-calorie malnutrition, severe (HCC)   Metabolic acidosis   Acute on chronic kidney failure (HCC)   Uncontrolled type 2 diabetes mellitus with hyperglycemia (HCC)   ARF (acute renal failure) (HCC)   Diarrhea in adult patient   Hyperosmolar non-ketotic state in patient with type 2 diabetes mellitus ([place])   Acute renal failure superimposed on stage 4 chronic kidney disease ([place])   Uncontrolled type 2 DM with hyperosmolar nonketotic hyperglycemia ([place])   Discharge Condition: Stable and improved  Diet recommendation: Carb modified diet  Filed Weights   09/16/17 1100 09/17/17 0500 09/18/17 0500  Weight: 43.5 kg (95 lb 14.4 oz) 43.9 kg (96 lb 12.5 oz) 48.2 kg (106 lb 4.2 oz)    History of present illness:  Per Dr. Wynetta Fines is a 84 y.o. female with history of stroke with right-sided weakness, peripheral artery disease status post angioplasty, diastolic dysfunction, sick sinus syndrome status post pacemaker placement, chronic  kidney disease stage IV was brought to the ER with complaints of having persistent diarrhea since morning.  Patient's daughter stated that patient had some hemorrhoidal problem 2 days ago and had hemorrhoid cream placed.  Following which patient started developing severe diarrhea.  Denied any vomiting.  Denies any abdominal pain but has been feeling generally weak.  Has not had any chest pain or shortness of breath.  ED Course: In the ER patient labs showed severe hypokalemia with potassium less than 2.  Creatinine has increased from 1.9 in March 2019 to 3.2.  Patient was given fluid bolus and potassium replacement and admitted for acute on chronic renal failure secondary to diarrhea with severe hypokalemia.  Patient's blood sugar is also in the 500s.  Has taken NPH insulin yesterday morning at home as per the daughter but have not given further doses in the ER due to severe hypokalemia at this time.     Hospital Course:  1 type 2 diabetes mellitus with hyperosmolar nonketotic hyperglycemia Patient had presented with severe hypokalemia, who presented with significant weakness, noted to have had some diarrhea.  Urinalysis done, worrisome for UTI.  Patient noted to have a anion gap of 16 with a blood glucose level of 504.  Bicarb of 29 on the first day of the hospitalization.  Chest x-ray obtained was negative for any acute infiltrates.  Cardiac enzymes which were cycled were minimally elevated but seem to be flattened.  Patient denied any chest pain.  2D echo obtained with results pending to be followed up in the outpatient setting.  Patient was noted to have a hyper osmolar nonketotic  hyperglycemia and subsequently placed on the glucose stabilizer, gentle IV hydration and transferred to the stepdown unit.  Patient's blood glucose levels improved and patient was subsequently transitioned to Lantus 10 units daily as well as sliding scale insulin.  Patient noted to have poor oral intake.  Patient was noted  to have CBGs as low as 39 on the morning of 09/17/2017 as such long-acting Lantus was discontinued.  Patient CBGs improved with oral intake.  Patient was maintained on sliding scale insulin only for the rest of the hospitalization.  Hemoglobin A1c was 16.2.  Patient received a total of 3 days of IV antibiotics.  Patient was discharged home back on her home regimen of 70/35 units twice daily with close outpatient follow-up with PCP.  2.  Severe hypokalemia Likely secondary to diuretics.  Per family patient's diuretics were resumed by PCP recently.  Patient also noted to have some diarrhea per admission history and physical.  Potassium on admission was noted to be less than 2.  Patient's potassium was repleted and hypokalemia had resolved by day of discharge.  Outpatient follow-up with PCP in 1 week for a basic metabolic profile.   3.  Dehydration Patient noted to be extremely dehydrated on admission.  Patient noted to have been on diuretics prior to admission and also some bouts of diarrhea.  Patient was hydrated with IV fluids and was euvolemic by day of discharge.    4.  Acute on chronic kidney disease stage IV Likely secondary to a prerenal azotemia as patient noted to be significantly dry on examination.  Patient had presented with diarrhea.  Also noted per family that patient was resumed back on her diuretics with poor oral intake.  Renal ultrasound consistent with medical renal disease and negative for obstruction/hydronephrosis.  Diuretics were held.  Patient was gently hydrated with IV fluids with improvement with renal function such that by day of discharge creatinine was down to  2.24 from 3.26 on admission.  Outpatient follow-up with PCP.   5.  History of CVA Stable.    Patient was initially maintained on aspirin and Plavix for secondary stroke prophylaxis.  Per family Plavix was discontinued by patient's PCP.  Plavix was subsequently discontinued during the hospitalization.  Outpatient  follow-up with PCP.    6.  Hypertension Stable.    Patient maintained on home regimen of hydralazine, imdur, Lopressor.  7.  Vitamin D deficiency Significantly decreased vitamin D 25-hydroxy was 6.2.  Patient was resumed back on home regimen of Os-Cal with vitamin D, vitamin D 50,000 units q. weekly.  Outpatient follow-up.   8.  Generalized weakness Likely secondary to dehydration and volume depletion.    Improved with hydration.  Patient was seen by PT/OT.  Patient was discharged home with home health therapies.    9.  Diarrhea Questionable etiology.  Patient had 2 loose stools and none since then.  Diarrhea resolved.  Patient noted to be on stool softener prior to admission which will be discontinued on discharge.  Patient be discharged home on Dulcolax as needed.   10.  History of peripheral vascular disease/coronary artery disease Patient was remained on aspirin, Plavix, Lopressor, hydralazine,imdur.  11.  History of sick sinus syndrome status post PPM  12.  Probable UTI vs bacteria in urine Urine cultures negative.  Patient received 3 days of IV Rocephin.  Remained afebrile.  No need for any further antibiotics.       Procedures:  Right IJ TL picc per Dr. Annamaria Boots 09/16/2017  2D echo 09/16/2017  Renal ultrasound 09/17/2017  Consultations:  None  Discharge Exam: Vitals:   09/19/17 1434 09/19/17 1441  BP: (!) 201/85 (!) 168/66  Pulse: 61   Resp: 16   Temp: (!) 97.5 F (36.4 C)   SpO2: 97%     General: NAD Cardiovascular: RRR Respiratory: CTAB  Discharge Instructions   Discharge Instructions    Diet Carb Modified   Complete by:  As directed    Increase activity slowly   Complete by:  As directed      Allergies as of 09/19/2017      Reactions   Norvasc [amlodipine Besylate] Swelling   Peanut-containing Drug Products Other (See Comments)   Stomach pain      Medication List    STOP taking these medications   clopidogrel 75 MG tablet Commonly  known as:  PLAVIX   docusate sodium 100 MG capsule Commonly known as:  COLACE   furosemide 20 MG tablet Commonly known as:  LASIX   oseltamivir 30 MG capsule Commonly known as:  TAMIFLU   potassium chloride SA 20 MEQ tablet Commonly known as:  K-DUR,KLOR-CON     TAKE these medications   acetaminophen 325 MG tablet Commonly known as:  TYLENOL Take 2 tablets (650 mg total) by mouth every 4 (four) hours as needed for headache or mild pain.   aspirin EC 81 MG tablet Take 81 mg by mouth daily.   bisacodyl 5 MG EC tablet Commonly known as:  DULCOLAX Take 1 tablet (5 mg total) by mouth daily as needed for moderate constipation.   calcium-vitamin D 500-200 MG-UNIT tablet Commonly known as:  OSCAL WITH D Take 2 tablets by mouth 4 (four) times daily.   diclofenac sodium 1 % Gel Commonly known as:  VOLTAREN Apply 2 g topically 4 (four) times daily.   feeding supplement (GLUCERNA SHAKE) Liqd Take 237 mLs by mouth 3 (three) times daily between meals.   Fish Oil 1000 MG Caps Take 1,000 mg by mouth 2 (two) times daily.   hydrALAZINE 10 MG tablet Commonly known as:  APRESOLINE Take 1 tablet (10 mg total) by mouth 3 (three) times daily.   insulin NPH-regular Human (70-30) 100 UNIT/ML injection Commonly known as:  NOVOLIN 70/30 Inject 5 Units into the skin 2 (two) times daily.   isosorbide mononitrate 120 MG 24 hr tablet Commonly known as:  IMDUR Take 1 tablet (120 mg total) by mouth daily. Resume in 2 days.   metoprolol tartrate 25 MG tablet Commonly known as:  LOPRESSOR Take 25 mg by mouth 2 (two) times daily.   Vitamin D (Ergocalciferol) 50000 units Caps capsule Commonly known as:  DRISDOL Take 1 capsule (50,000 Units total) by mouth every 7 (seven) days.      Allergies  Allergen Reactions  . Norvasc [Amlodipine Besylate] Swelling  . Peanut-Containing Drug Products Other (See Comments)    Stomach pain   Follow-up Information    Glendale Chard, MD. Schedule an  appointment as soon as possible for a visit in 1 week(s).   Specialty:  Internal Medicine Contact information: [address] [place] [address] [phone]            The results of significant diagnostics from this hospitalization (including imaging, microbiology, ancillary and laboratory) are listed below for reference.    Significant Diagnostic Studies: US Renal  Result Date: 09/17/2017 CLINICAL DATA:  Acute on chronic renal failure and history of diabetes. History of chronic kidney disease, stage IV.  EXAM: RENAL / URINARY TRACT ULTRASOUND COMPLETE COMPARISON:  01/15/2016 FINDINGS: Right Kidney: Length: 9.2 cm. Stable echogenic appearance with small 1.6 cm upper pole simple cyst. No hydronephrosis. Left Kidney: Length: 9.7 cm.  Stable echogenic appearance without hydronephrosis. Bladder: The bladder is completely decompressed by a Foley catheter. IMPRESSION: The kidneys are stable in size and shows stable echogenic appearance consistent with underlying chronic kidney disease. No evidence of renal obstruction. Electronically Signed   By: Aletta Edouard M.D.   On: 09/17/2017 10:35   Ir Fluoro Guide Cv Line Right  Result Date: 09/16/2017 INDICATION: Diabetic ketoacidosis, limited access EXAM: ULTRASOUND AND FLUOROSCOPIC RIGHT IJ TRIPLE-LUMEN PICC LINE MEDICATIONS: 1% lidocaine local ANESTHESIA/SEDATION: Moderate Sedation Time: None. The patient's level of consciousness and vital signs were monitored continuously by radiology nursing throughout the procedure under my direct supervision. FLUOROSCOPY TIME:  Fluoroscopy Time: 12 seconds (1 mGy). COMPLICATIONS: None immediate. PROCEDURE: Informed written consent was obtained from the patient's power of attorney after a thorough discussion of the procedural risks, benefits and alternatives. All questions were addressed. Maximal Sterile Barrier Technique was utilized including caps, mask, sterile gowns, sterile gloves, sterile  drape, hand hygiene and skin antiseptic. A timeout was performed prior to the initiation of the procedure. Under sterile conditions and local anesthesia, ultrasound micropuncture access performed of the right internal jugular vein. Images obtained for documentation of the patent right internal jugular vein. Guidewire advanced easily followed by the peel-away sheath. Measurements obtained for the appropriate length. Over the guidewire, a 15 cm 6 French triple-lumen PICC line was advanced with the tip position at the SVC RA junction. Position confirmed with fluoroscopy. Images obtained for documentation. Blood aspirated easily followed by saline heparin flushes. Appropriate volume and strength of heparin instilled in all lumens followed by external caps. Catheter secured with Ethilon sutures and a sterile dressing. No immediate complication. Patient tolerated the procedure well. IMPRESSION: Successful ultrasound and fluoroscopic right IJ triple-lumen 6 French PICC line. Tip SVC RA junction. Ready for use. Electronically Signed   By: Jerilynn Mages.  Shick M.D.   On: 09/16/2017 10:55   Ir US Guide Vasc Access Right  Result Date: 09/16/2017 INDICATION: Diabetic ketoacidosis, limited access EXAM: ULTRASOUND AND FLUOROSCOPIC RIGHT IJ TRIPLE-LUMEN PICC LINE MEDICATIONS: 1% lidocaine local ANESTHESIA/SEDATION: Moderate Sedation Time: None. The patient's level of consciousness and vital signs were monitored continuously by radiology nursing throughout the procedure under my direct supervision. FLUOROSCOPY TIME:  Fluoroscopy Time: 12 seconds (1 mGy). COMPLICATIONS: None immediate. PROCEDURE: Informed written consent was obtained from the patient's power of attorney after a thorough discussion of the procedural risks, benefits and alternatives. All questions were addressed. Maximal Sterile Barrier Technique was utilized including caps, mask, sterile gowns, sterile gloves, sterile drape, hand hygiene and skin antiseptic. A timeout was  performed prior to the initiation of the procedure. Under sterile conditions and local anesthesia, ultrasound micropuncture access performed of the right internal jugular vein. Images obtained for documentation of the patent right internal jugular vein. Guidewire advanced easily followed by the peel-away sheath. Measurements obtained for the appropriate length. Over the guidewire, a 15 cm 6 French triple-lumen PICC line was advanced with the tip position at the SVC RA junction. Position confirmed with fluoroscopy. Images obtained for documentation. Blood aspirated easily followed by saline heparin flushes. Appropriate volume and strength of heparin instilled in all lumens followed by external caps. Catheter secured with Ethilon sutures and a sterile dressing. No immediate complication. Patient tolerated the procedure well. IMPRESSION: Successful ultrasound and fluoroscopic right  IJ triple-lumen 6 French PICC line. Tip SVC RA junction. Ready for use. Electronically Signed   By: Jerilynn Mages.  Shick M.D.   On: 09/16/2017 10:55    Microbiology: Recent Results (from the past 240 hour(s))  MRSA PCR Screening     Status: None   Collection Time: 09/16/17 11:13 AM  Result Value Ref Range Status   MRSA by PCR NEGATIVE NEGATIVE Final    Comment:        The GeneXpert MRSA Assay (FDA approved for NASAL specimens only), is one component of a comprehensive MRSA colonization surveillance program. It is not intended to diagnose MRSA infection nor to guide or monitor treatment for MRSA infections. Performed at [facility], [place] [address] [place], [place] [address]   Culture, Urine     Status: None   Collection Time: 09/16/17 12:19 PM  Result Value Ref Range Status   Specimen Description   Final    URINE, CATHETERIZED Performed at [facility] [address], [place], [place] [address]    Special Requests   Final    NONE Performed at [facility], [place]  [address], [place], [place] [address]    Culture   Final    NO GROWTH Performed at [facility], [place] [address] [place], [place] [address]    Report Status 09/17/2017 FINAL  Final     Labs: Basic Metabolic Panel: Recent Labs  Lab 09/16/17 0636  09/16/17 2129 09/17/17 0241 09/17/17 0530 09/17/17 0535 09/18/17 0552 09/19/17 0409  NA 133*   < > 139 141  --  140 139 140  K 3.6   < > 3.0* 4.1  --  3.5 4.0 4.2  CL 88*   < > 99 103  --  103 107 109  CO2 29   < > 29 29  --  30 26 25   GLUCOSE 242*   < > 163* 67*  --  80 92 68*  BUN 45*   < > 42* 41*  --  39* 37* 39*  CREATININE 3.14*   < > 2.56* 2.54*  --  2.40* 2.37* 2.24*  CALCIUM 7.2*   < > 7.0* 7.2*  --  7.3* 7.7* 7.7*  MG 1.7  --   --   --  1.8  --  2.8*  --    < > = values in this interval not displayed.   Liver Function Tests: Recent Labs  Lab 09/16/17 0636  AST 17  ALT 13  ALKPHOS 107  BILITOT 0.4  PROT 5.6*  ALBUMIN 2.1*   No results for input(s): LIPASE, AMYLASE in the last 168 hours. No results for input(s): AMMONIA in the last 168 hours. CBC: Recent Labs  Lab 09/16/17 0113 09/18/17 0552 09/19/17 0409  WBC 5.7 5.6 6.2  NEUTROABS 4.5  --   --   HGB 13.7 12.2 12.3  HCT 38.1 34.6* 36.3  MCV 89.2 90.3 91.9  PLT 183 155 161   Cardiac Enzymes: Recent Labs  Lab 09/16/17 0636 09/16/17 0856 09/16/17 1659 09/16/17 2129  TROPONINI 0.09* 0.08* 0.07* 0.06*   BNP: BNP (last 3 results) No results for input(s): BNP in the last 8760 hours.  ProBNP (last 3 results) No results for input(s): PROBNP in the last 8760 hours.  CBG: Recent Labs  Lab 09/18/17 2339 09/19/17 0509 09/19/17 0604 09/19/17 0827 09/19/17 1151  GLUCAP 83 64* 98 153* 127*       Signed:  Irine Seal  MD.  [facility] 09/19/2017, 3:37 PM

## 2017-09-19 NOTE — Progress Notes (Signed)
Patient has discharged to home on 09/19/17. Notified family member at 32. Discharge instruction including medication and appointment was given to patient and family member. No question at this time.

## 2017-09-19 NOTE — Care Management Note (Signed)
Case Management Note  Patient Details  Name: Carmen [name] MRN: [id number] Date of Birth: 1934/01/16  Subjective/Objective:                  hhc  Action/Plan: [facility] to do hhc  Expected Discharge Date:                  Expected Discharge Plan:  [place]  In-House Referral:     Discharge planning Services  CM Consult  Post Acute Care Choice:    Choice offered to:  Patient  DME Arranged:    DME Agency:     HH Arranged:  RN, PT, OT, Nurse's Aide [place] Agency:  [facility]  Status of Service:  Completed, signed off  If discussed at [place] of Stay Meetings, dates discussed:    Additional Comments:  Leeroy Cha, RN 09/19/2017, 10:32 AM

## 2017-09-19 NOTE — Care Management Important Message (Signed)
Important Message  Patient Details  Name: TARICA HARL MRN: [id number] Date of Birth: 08/25/1933   Medicare Important Message Given:  Yes    Kerin Salen 09/19/2017, 12:16 [place] Message  Patient Details  Name: RYNN MARKIEWICZ MRN: [id number] Date of Birth: Jun 14, 1933   Medicare Important Message Given:  Yes    Kerin Salen 09/19/2017, 12:16 PM

## 2017-09-19 NOTE — Progress Notes (Signed)
Bladder scan performed at 0853 as MD ordered; urine residual = 43ml.

## 2017-09-19 NOTE — Progress Notes (Signed)
Patient's BP = 168/66 Hydralazine was given as ordered. Notified MD Grandville Silos at [phone]

## 2017-09-19 NOTE — Progress Notes (Signed)
Physical Therapy Treatment Patient Details Name: Carmen [name] MRN: [id number] DOB: Mar 24, 1933 Today's Date: 09/19/2017    History of Present Illness 84 y.o. female admitted with DM with nonketotic hyperglycemia, hypokalemia, weakness, and diarrhea. Hx of CVA with R sided weakness, PAD, SSS, pacemaker, CKD.     PT Comments    Pt OOB in recliner AxO x 4.  Stated she has 24/7 help from family.  Assisted with amb to bathroom one assist HHA as pt stated prior she did NOT use any AD but admitted to holding to furniture, walls and doorways to steady self.  Pt required Min Assist with all transfers and gait.  Would NOT rec pt amb by herself.  Did not attempt stairs due to pt fatigue.  Pt stated her 2 sons help "one on each side".     Follow Up Recommendations  Home health PT;Supervision/Assistance - 24 hour     Equipment Recommendations  None recommended by PT(has a walker, a cane and a 3:1 already )    Recommendations for Other Services       Precautions / Restrictions Precautions Precautions: Fall Precaution Comments: s/p CVA R UE hemiparsis  Restrictions Weight Bearing Restrictions: No    Mobility  Bed Mobility Overal bed mobility: Needs Assistance Bed Mobility: Supine to Sit     Supine to sit: Min guard;HOB elevated     General bed mobility comments: OOB in recliner   Transfers Overall transfer level: Needs assistance Equipment used: 1 person hand held assist Transfers: Sit to/from [facility] Sit to Stand: Min assist Stand pivot transfers: Min assist       General transfer comment: Assist to rise, stabilize, control descent. VCs safety, technique, hand placement.   Also assisted with toilet transfer.    Ambulation/Gait Ambulation/Gait assistance: Min assist Gait Distance (Feet): 25 Feet Assistive device: 1 person hand held assist;None(furniture walker ) Gait Pattern/deviations: Step-through pattern;Decreased stride length Gait velocity:  decreased   General Gait Details: pt stated prior she did NOT use any AD but admits to holding to furniture, walls and doorways at home.     Stairs Stairs: (unable to attempt.  Pt reports her 2 sons get one on each side to help her)           Wheelchair Mobility    Modified Rankin (Stroke Patients Only)       Balance Overall balance assessment: Needs assistance Sitting-balance support: Single extremity supported Sitting balance-Leahy Scale: Fair     Standing balance support: Single extremity supported Standing balance-Leahy Scale: Fair                              Cognition Arousal/Alertness: Awake/alert Behavior During Therapy: WFL for tasks assessed/performed Overall Cognitive Status: Within Functional Limits for tasks assessed                                 General Comments: mild speech impairedment s/p CVA      Exercises      General Comments        Pertinent Vitals/Pain Pain Assessment: No/denies pain    Home Living                      Prior Function            PT Goals (current goals can now be found in the care  plan section) Progress towards PT goals: Progressing toward goals    Frequency    Min 3X/week      PT Plan Current plan remains appropriate    Co-evaluation              AM-PAC PT "6 Clicks" Daily Activity  Outcome Measure  Difficulty turning over in bed (including adjusting bedclothes, sheets and blankets)?: A Little Difficulty moving from lying on back to sitting on the side of the bed? : A Little Difficulty sitting down on and standing up from a chair with arms (e.g., wheelchair, bedside commode, etc,.)?: A Little Help needed moving to and from a bed to chair (including a wheelchair)?: A Little Help needed walking in hospital room?: A Little Help needed climbing 3-5 steps with a railing? : A Lot 6 Click Score: 17    End of Session Equipment Utilized During Treatment: Gait  belt Activity Tolerance: Patient tolerated treatment well Patient left: in chair;with call bell/phone within reach;with family/visitor present   PT Visit Diagnosis: Muscle weakness (generalized) (M62.81);Difficulty in walking, not elsewhere classified (R26.2);Unsteadiness on feet (R26.81)     Time: 1130-1145 PT Time Calculation (min) (ACUTE ONLY): 15 min  Charges:  $Gait Training: 8-22 mins                    G Codes:       Rica Koyanagi  PTA [facility]  Acute  Rehab Pager      [phone]

## 2017-09-19 NOTE — Progress Notes (Signed)
CRITICAL VALUE ALERT  Critical Value:  CBG  68  Recheck CBG 98  Date & Time Notied: 09/19/17  0409   Provider Notified: Md.Bodenheimer, via Amion  Orders Received/Actions taken:

## 2017-09-19 NOTE — Progress Notes (Signed)
Occupational Therapy Treatment Patient Details Name: Carmen [name] MRN: [id number] DOB: Carmen [name] Today's Date: Carmen [name]    History of present illness 84 y.o. female admitted with DM with nonketotic hyperglycemia, hypokalemia, weakness, and diarrhea. Hx of CVA with R sided weakness, PAD, SSS, pacemaker, CKD.    OT comments  Pt agreeable to OT. No family present  Follow Up Recommendations  Home health OT;Supervision/Assistance - 24 hour;Other (comment)    Equipment Recommendations  None recommended by OT    Recommendations for Other Services      Precautions / Restrictions Precautions Precautions: Fall Restrictions Weight Bearing Restrictions: No       Mobility Bed Mobility Overal bed mobility: Needs Assistance Bed Mobility: Supine to Sit     Supine to sit: Min guard;HOB elevated        Transfers Overall transfer level: Needs assistance Equipment used: 1 person hand held assist Transfers: Sit to/from [facility] Sit to Stand: Min assist Stand pivot transfers: Min assist            Balance Overall balance assessment: Needs assistance Sitting-balance support: Single extremity supported Sitting balance-Leahy Scale: Fair     Standing balance support: Single extremity supported Standing balance-Leahy Scale: Fair                             ADL either performed or assessed with clinical judgement   ADL Overall ADL's : Needs assistance/impaired Eating/Feeding: Set up;Sitting   Grooming: Wash/dry face;Sitting;Minimal assistance                   Toilet Transfer: Minimal assistance;Ambulation;Stand-pivot Toilet Transfer Details (indicate cue type and reason): bed to chair Toileting- Clothing Manipulation and Hygiene: Minimal assistance;Sit to/from stand;Cueing for sequencing;Cueing for safety               Vision Patient Visual Report: No change from baseline     Perception     Praxis      Cognition  Arousal/Alertness: Awake/alert Behavior During Therapy: WFL for tasks assessed/performed Overall Cognitive Status: Within Functional Limits for tasks assessed                                 General Comments: difficult to understand                   Pertinent Vitals/ Pain       Pain Assessment: No/denies pain     Prior Functioning/Environment              Frequency  Min 2X/week        Progress Toward Goals  OT Goals(current goals can now be found in the care plan section)  Progress towards OT goals: Progressing toward goals     Plan Discharge plan remains appropriate    Co-evaluation                 AM-PAC PT "6 Clicks" Daily Activity     Outcome Measure   Help from another person eating meals?: A Little Help from another person taking care of personal grooming?: A Little Help from another person toileting, which includes using toliet, bedpan, or urinal?: A Little Help from another person bathing (including washing, rinsing, drying)?: A Little Help from another person to put on and taking off regular upper body clothing?: A Little Help from another person to put on and taking  off regular lower body clothing?: A Little 6 Click Score: 18    End of Session Equipment Utilized During Treatment: Gait belt  OT Visit Diagnosis: Unsteadiness on feet (R26.81);Muscle weakness (generalized) (M62.81)   Activity Tolerance Patient tolerated treatment well   Patient Left in chair;with call bell/phone within reach;with family/visitor present;with chair alarm set   Nurse Communication Mobility status        Time: 9753-0051 OT Time Calculation (min): 20 min  Charges: OT General Charges $OT Visit: 1 Visit OT Treatments $Self Care/Home Management : 8-22 mins  Carmen [name], Carmen [name]   Carmen [name] Carmen [name], 11:31 AM

## 2017-09-19 NOTE — Progress Notes (Signed)
Notified IV nurse for removing PICC line at 1645.

## 2017-09-20 ENCOUNTER — Encounter (HOSPITAL_COMMUNITY): Payer: Self-pay

## 2017-09-20 ENCOUNTER — Emergency Department (HOSPITAL_COMMUNITY): Payer: Medicare HMO

## 2017-09-20 ENCOUNTER — Inpatient Hospital Stay (HOSPITAL_COMMUNITY)
Admission: EM | Admit: 2017-09-20 | Discharge: 2017-09-22 | DRG: 439 | Disposition: A | Discharge status: Home / Self Care | Payer: Medicare HMO | Attending: Internal Medicine | Admitting: Internal Medicine

## 2017-09-20 ENCOUNTER — Inpatient Hospital Stay: Payer: Self-pay

## 2017-09-20 DIAGNOSIS — R112 Nausea with vomiting, unspecified: Secondary | ICD-10-CM

## 2017-09-20 DIAGNOSIS — I70209 Unspecified atherosclerosis of native arteries of extremities, unspecified extremity: Secondary | ICD-10-CM | POA: Diagnosis present

## 2017-09-20 DIAGNOSIS — R600 Localized edema: Secondary | ICD-10-CM | POA: Diagnosis not present

## 2017-09-20 DIAGNOSIS — Z66 Do not resuscitate: Secondary | ICD-10-CM | POA: Diagnosis not present

## 2017-09-20 DIAGNOSIS — I120 Hypertensive chronic kidney disease with stage 5 chronic kidney disease or end stage renal disease: Secondary | ICD-10-CM | POA: Diagnosis present

## 2017-09-20 DIAGNOSIS — Z87891 Personal history of nicotine dependence: Secondary | ICD-10-CM

## 2017-09-20 DIAGNOSIS — N390 Urinary tract infection, site not specified: Secondary | ICD-10-CM | POA: Diagnosis present

## 2017-09-20 DIAGNOSIS — Z7189 Other specified counseling: Secondary | ICD-10-CM | POA: Diagnosis not present

## 2017-09-20 DIAGNOSIS — R5383 Other fatigue: Secondary | ICD-10-CM

## 2017-09-20 DIAGNOSIS — Z794 Long term (current) use of insulin: Secondary | ICD-10-CM

## 2017-09-20 DIAGNOSIS — K869 Disease of pancreas, unspecified: Secondary | ICD-10-CM | POA: Diagnosis not present

## 2017-09-20 DIAGNOSIS — Z515 Encounter for palliative care: Secondary | ICD-10-CM | POA: Diagnosis not present

## 2017-09-20 DIAGNOSIS — M1990 Unspecified osteoarthritis, unspecified site: Secondary | ICD-10-CM | POA: Diagnosis present

## 2017-09-20 DIAGNOSIS — I10 Essential (primary) hypertension: Secondary | ICD-10-CM | POA: Diagnosis present

## 2017-09-20 DIAGNOSIS — E1122 Type 2 diabetes mellitus with diabetic chronic kidney disease: Secondary | ICD-10-CM | POA: Diagnosis present

## 2017-09-20 DIAGNOSIS — R609 Edema, unspecified: Secondary | ICD-10-CM | POA: Diagnosis not present

## 2017-09-20 DIAGNOSIS — N185 Chronic kidney disease, stage 5: Secondary | ICD-10-CM | POA: Diagnosis present

## 2017-09-20 DIAGNOSIS — R933 Abnormal findings on diagnostic imaging of other parts of digestive tract: Secondary | ICD-10-CM | POA: Diagnosis not present

## 2017-09-20 DIAGNOSIS — K861 Other chronic pancreatitis: Secondary | ICD-10-CM | POA: Diagnosis not present

## 2017-09-20 DIAGNOSIS — E46 Unspecified protein-calorie malnutrition: Secondary | ICD-10-CM | POA: Diagnosis present

## 2017-09-20 DIAGNOSIS — D649 Anemia, unspecified: Secondary | ICD-10-CM | POA: Diagnosis present

## 2017-09-20 DIAGNOSIS — Z7982 Long term (current) use of aspirin: Secondary | ICD-10-CM | POA: Diagnosis not present

## 2017-09-20 DIAGNOSIS — E1165 Type 2 diabetes mellitus with hyperglycemia: Secondary | ICD-10-CM | POA: Diagnosis not present

## 2017-09-20 DIAGNOSIS — M5126 Other intervertebral disc displacement, lumbar region: Secondary | ICD-10-CM | POA: Diagnosis present

## 2017-09-20 DIAGNOSIS — R627 Adult failure to thrive: Secondary | ICD-10-CM | POA: Diagnosis present

## 2017-09-20 DIAGNOSIS — I69351 Hemiplegia and hemiparesis following cerebral infarction affecting right dominant side: Secondary | ICD-10-CM

## 2017-09-20 DIAGNOSIS — R509 Fever, unspecified: Secondary | ICD-10-CM | POA: Diagnosis not present

## 2017-09-20 DIAGNOSIS — Z91010 Allergy to peanuts: Secondary | ICD-10-CM | POA: Diagnosis not present

## 2017-09-20 DIAGNOSIS — R54 Age-related physical debility: Secondary | ICD-10-CM | POA: Diagnosis present

## 2017-09-20 DIAGNOSIS — I2510 Atherosclerotic heart disease of native coronary artery without angina pectoris: Secondary | ICD-10-CM | POA: Diagnosis present

## 2017-09-20 DIAGNOSIS — E1151 Type 2 diabetes mellitus with diabetic peripheral angiopathy without gangrene: Secondary | ICD-10-CM | POA: Diagnosis present

## 2017-09-20 DIAGNOSIS — E1110 Type 2 diabetes mellitus with ketoacidosis without coma: Secondary | ICD-10-CM | POA: Diagnosis not present

## 2017-09-20 DIAGNOSIS — Z950 Presence of cardiac pacemaker: Secondary | ICD-10-CM | POA: Diagnosis not present

## 2017-09-20 DIAGNOSIS — Z6820 Body mass index (BMI) 20.0-20.9, adult: Secondary | ICD-10-CM | POA: Diagnosis not present

## 2017-09-20 DIAGNOSIS — R1111 Vomiting without nausea: Secondary | ICD-10-CM

## 2017-09-20 DIAGNOSIS — R197 Diarrhea, unspecified: Secondary | ICD-10-CM

## 2017-09-20 DIAGNOSIS — Z888 Allergy status to other drugs, medicaments and biological substances status: Secondary | ICD-10-CM

## 2017-09-20 DIAGNOSIS — E860 Dehydration: Secondary | ICD-10-CM | POA: Diagnosis present

## 2017-09-20 DIAGNOSIS — R1110 Vomiting, unspecified: Secondary | ICD-10-CM | POA: Diagnosis not present

## 2017-09-20 DIAGNOSIS — R531 Weakness: Secondary | ICD-10-CM | POA: Diagnosis not present

## 2017-09-20 DIAGNOSIS — N183 Chronic kidney disease, stage 3 unspecified: Secondary | ICD-10-CM | POA: Diagnosis present

## 2017-09-20 DIAGNOSIS — J9811 Atelectasis: Secondary | ICD-10-CM | POA: Diagnosis not present

## 2017-09-20 DIAGNOSIS — Z8673 Personal history of transient ischemic attack (TIA), and cerebral infarction without residual deficits: Secondary | ICD-10-CM

## 2017-09-20 DIAGNOSIS — Z79899 Other long term (current) drug therapy: Secondary | ICD-10-CM | POA: Diagnosis not present

## 2017-09-20 DIAGNOSIS — K8689 Other specified diseases of pancreas: Secondary | ICD-10-CM | POA: Diagnosis present

## 2017-09-20 LAB — I-STAT CHEM 8, ED
BUN: 62 mg/dL — ABNORMAL HIGH (ref 8–23)
BUN: 38 mg/dL — ABNORMAL HIGH (ref 8–23)
CALCIUM ION: 1.06 mmol/L — AB (ref 1.15–1.40)
Calcium, Ion: 1.08 mmol/L — ABNORMAL LOW (ref 1.15–1.40)
Chloride: 107 mmol/L (ref 98–111)
Chloride: 108 mmol/L (ref 98–111)
Creatinine, Ser: 2.3 mg/dL — ABNORMAL HIGH (ref 0.44–1.00)
Creatinine, Ser: 2.2 mg/dL — ABNORMAL HIGH (ref 0.44–1.00)
GLUCOSE: 112 mg/dL — AB (ref 70–99)
Glucose, Bld: 112 mg/dL — ABNORMAL HIGH (ref 70–99)
HCT: 38 % (ref 36.0–46.0)
HEMATOCRIT: 42 % (ref 36.0–46.0)
HEMOGLOBIN: 14.3 g/dL (ref 12.0–15.0)
Hemoglobin: 12.9 g/dL (ref 12.0–15.0)
Potassium: 8 mmol/L (ref 3.5–5.1)
Potassium: 4.9 mmol/L (ref 3.5–5.1)
SODIUM: 135 mmol/L (ref 135–145)
Sodium: 138 mmol/L (ref 135–145)
TCO2: 24 mmol/L (ref 22–32)
TCO2: 21 mmol/L — ABNORMAL LOW (ref 22–32)

## 2017-09-20 LAB — CBC WITH DIFFERENTIAL/PLATELET
BASOS PCT: 0 %
Basophils Absolute: 0 10*3/uL (ref 0.0–0.1)
EOS PCT: 1 %
Eosinophils Absolute: 0 10*3/uL (ref 0.0–0.7)
HCT: 36.8 % (ref 36.0–46.0)
Hemoglobin: 12.5 g/dL (ref 12.0–15.0)
Lymphocytes Relative: 23 %
Lymphs Abs: 1.1 10*3/uL (ref 0.7–4.0)
MCH: 31.3 pg (ref 26.0–34.0)
MCHC: 34 g/dL (ref 30.0–36.0)
MCV: 92.2 fL (ref 78.0–100.0)
MONO ABS: 0.2 10*3/uL (ref 0.1–1.0)
MONOS PCT: 3 %
Neutro Abs: 3.6 10*3/uL (ref 1.7–7.7)
Neutrophils Relative %: 73 %
PLATELETS: 168 10*3/uL (ref 150–400)
RBC: 3.99 MIL/uL (ref 3.87–5.11)
RDW: 13.4 % (ref 11.5–15.5)
WBC: 4.9 10*3/uL (ref 4.0–10.5)

## 2017-09-20 LAB — COMPREHENSIVE METABOLIC PANEL
ALK PHOS: 131 U/L — AB (ref 38–126)
ALT: 24 U/L (ref 0–44)
ANION GAP: 9 (ref 5–15)
AST: 35 U/L (ref 15–41)
Albumin: 1.9 g/dL — ABNORMAL LOW (ref 3.5–5.0)
BUN: 40 mg/dL — ABNORMAL HIGH (ref 8–23)
CALCIUM: 7.8 mg/dL — AB (ref 8.9–10.3)
CO2: 23 mmol/L (ref 22–32)
CREATININE: 2.27 mg/dL — AB (ref 0.44–1.00)
Chloride: 108 mmol/L (ref 98–111)
GFR, EST AFRICAN AMERICAN: 22 mL/min — AB (ref >60)
GFR, EST NON AFRICAN AMERICAN: 19 mL/min — AB (ref >60)
Glucose, Bld: 116 mg/dL — ABNORMAL HIGH (ref 70–99)
Potassium: 5.8 mmol/L — ABNORMAL HIGH (ref 3.5–5.1)
Sodium: 140 mmol/L (ref 135–145)
TOTAL PROTEIN: 5.1 g/dL — AB (ref 6.5–8.1)
Total Bilirubin: 0.6 mg/dL (ref 0.3–1.2)

## 2017-09-20 LAB — URINALYSIS, ROUTINE W REFLEX MICROSCOPIC
Bilirubin Urine: NEGATIVE
GLUCOSE, UA: NEGATIVE mg/dL
KETONES UR: 5 mg/dL — AB
NITRITE: NEGATIVE
PH: 5 (ref 5.0–8.0)
Protein, ur: 100 mg/dL — AB
Specific Gravity, Urine: 1.015 (ref 1.005–1.030)

## 2017-09-20 LAB — I-STAT TROPONIN, ED: Troponin i, poc: 0.01 ng/mL (ref 0.00–0.08)

## 2017-09-20 LAB — I-STAT CG4 LACTIC ACID, ED: LACTIC ACID, VENOUS: 0.95 mmol/L (ref 0.5–1.9)

## 2017-09-20 LAB — BRAIN NATRIURETIC PEPTIDE: B NATRIURETIC PEPTIDE 5: 643 pg/mL — AB (ref 0.0–100.0)

## 2017-09-20 LAB — PROTIME-INR
INR: 1.05
Prothrombin Time: 13.6 seconds (ref 11.4–15.2)

## 2017-09-20 LAB — LIPASE, BLOOD: LIPASE: 15 U/L (ref 11–51)

## 2017-09-20 MED ORDER — ASPIRIN EC 81 MG PO TBEC
81.0000 mg | DELAYED_RELEASE_TABLET | Freq: Every day | ORAL | Status: DC
  Administered 2017-09-21 – 2017-09-22 (×2): 81 mg via ORAL
  Filled 2017-09-20 (×2): qty 1

## 2017-09-20 MED ORDER — INSULIN ASPART PROT & ASPART (70-30 MIX) 100 UNIT/ML ~~LOC~~ SUSP
5.0000 [IU] | Freq: Two times a day (BID) | SUBCUTANEOUS | Status: DC
  Administered 2017-09-21 – 2017-09-22 (×3): 5 [IU] via SUBCUTANEOUS
  Filled 2017-09-20: qty 10

## 2017-09-20 MED ORDER — ACETAMINOPHEN 650 MG RE SUPP: 650.0000 mg | Freq: Four times a day (QID) | RECTAL | Status: DC | PRN

## 2017-09-20 MED ORDER — GI COCKTAIL ~~LOC~~
30.0000 mL | Freq: Once | ORAL | Status: AC
  Administered 2017-09-20: 30 mL via ORAL
  Filled 2017-09-20: qty 30

## 2017-09-20 MED ORDER — ACETAMINOPHEN 325 MG PO TABS
650.0000 mg | ORAL_TABLET | Freq: Four times a day (QID) | ORAL | Status: DC | PRN
  Administered 2017-09-21: 650 mg via ORAL
  Filled 2017-09-20: qty 2

## 2017-09-20 MED ORDER — METOPROLOL TARTRATE 25 MG PO TABS
25.0000 mg | ORAL_TABLET | Freq: Two times a day (BID) | ORAL | Status: DC
  Administered 2017-09-21 – 2017-09-22 (×3): 25 mg via ORAL
  Filled 2017-09-20 (×3): qty 1

## 2017-09-20 MED ORDER — GLUCERNA SHAKE PO LIQD
237.0000 mL | Freq: Three times a day (TID) | ORAL | Status: DC
  Administered 2017-09-21 – 2017-09-22 (×5): 237 mL via ORAL
  Filled 2017-09-20 (×6): qty 237

## 2017-09-20 MED ORDER — ONDANSETRON HCL 4 MG/2ML IJ SOLN: 4.0000 mg | Freq: Four times a day (QID) | INTRAMUSCULAR | Status: DC | PRN

## 2017-09-20 MED ORDER — ISOSORBIDE MONONITRATE ER 60 MG PO TB24
120.0000 mg | ORAL_TABLET | Freq: Every day | ORAL | Status: DC
  Administered 2017-09-21 – 2017-09-22 (×2): 120 mg via ORAL
  Filled 2017-09-20 (×2): qty 2

## 2017-09-20 MED ORDER — SODIUM CHLORIDE 0.9 % IV BOLUS: 1000.0000 mL | Freq: Once | INTRAVENOUS | Status: DC

## 2017-09-20 MED ORDER — HYDRALAZINE HCL 10 MG PO TABS
10.0000 mg | ORAL_TABLET | Freq: Three times a day (TID) | ORAL | Status: DC
  Administered 2017-09-21 – 2017-09-22 (×6): 10 mg via ORAL
  Filled 2017-09-20 (×6): qty 1

## 2017-09-20 MED ORDER — FAMOTIDINE IN NACL 20-0.9 MG/50ML-% IV SOLN: 20.0000 mg | Freq: Two times a day (BID) | INTRAVENOUS | Status: DC

## 2017-09-20 MED ORDER — SODIUM CHLORIDE 0.9 % IV SOLN
Freq: Once | INTRAVENOUS | Status: AC
  Administered 2017-09-20: 15:00:00 via INTRAVENOUS

## 2017-09-20 MED ORDER — ONDANSETRON HCL 4 MG PO TABS: 4.0000 mg | ORAL_TABLET | Freq: Four times a day (QID) | ORAL | Status: DC | PRN

## 2017-09-20 MED ORDER — OMEGA-3-ACID ETHYL ESTERS 1 G PO CAPS
1.0000 g | ORAL_CAPSULE | Freq: Every day | ORAL | Status: DC
  Administered 2017-09-21 – 2017-09-22 (×2): 1 g via ORAL
  Filled 2017-09-20 (×2): qty 1

## 2017-09-20 MED ORDER — ACETAMINOPHEN 500 MG PO TABS
1000.0000 mg | ORAL_TABLET | Freq: Once | ORAL | Status: AC
  Administered 2017-09-20: 1000 mg via ORAL
  Filled 2017-09-20: qty 2

## 2017-09-20 MED ORDER — ONDANSETRON HCL 4 MG/2ML IJ SOLN
4.0000 mg | Freq: Once | INTRAMUSCULAR | Status: DC
  Filled 2017-09-20: qty 2

## 2017-09-20 MED ORDER — BISACODYL 5 MG PO TBEC
5.0000 mg | DELAYED_RELEASE_TABLET | Freq: Every day | ORAL | Status: DC | PRN
  Administered 2017-09-21 – 2017-09-22 (×2): 5 mg via ORAL
  Filled 2017-09-20 (×2): qty 1

## 2017-09-20 NOTE — Progress Notes (Signed)
ED TO INPATIENT HANDOFF REPORT  Name/Age/Gender Carmen [name] 84 y.o. female  Code Status Code Status History    Date Active Date Inactive Code Status Order ID Comments User Context   09/18/2017 1224 09/19/2017 2114 DNR [id number]  Eugenie Filler, MD Inpatient   09/16/2017 0758 09/18/2017 1224 Full Code [id number]  Eugenie Filler, MD Inpatient   09/16/2017 0411 09/16/2017 0758 DNR [id number]  [name], MD Inpatient   05/07/2017 0126 05/10/2017 1937 DNR [id number]  Merton Border, MD Inpatient   07/26/2016 0958 07/27/2016 1850 DNR [id number]  Eugenie Filler, MD Inpatient   07/25/2016 2022 07/26/2016 0958 Full Code [id number]  Eugenie Filler, MD Inpatient   07/25/2016 1627 07/25/2016 2021 DNR [id number]  Eugenie Filler, MD ED   01/13/2016 2047 01/15/2016 1705 DNR [id number]  Reubin Milan, MD Inpatient   01/13/2016 2047 01/13/2016 2047 DNR [id number]  Reubin Milan, MD Inpatient   10/29/2015 0131 10/30/2015 1703 DNR [id number]  [name], MD ED   02/26/2015 2102 03/01/2015 1344 DNR [id number]  Hilbert Bible, Rhetta Mura, NP Inpatient   02/26/2015 1517 02/26/2015 2102 DNR [id number]  Karen Kitchens Inpatient   01/14/2015 0549 01/16/2015 1410 Full Code [id number]  Norval Morton, MD Inpatient   11/05/2014 1803 11/07/2014 1957 Full Code [id number]  Serafina Mitchell, MD Inpatient   10/28/2014 1630 11/05/2014 1803 Full Code [id number]  Florencia Reasons, MD Inpatient   10/28/2014 1551 10/28/2014 1630 Full Code [id number]  Florencia Reasons, MD ED   09/11/2014 2046 09/16/2014 1747 Full Code [id number]  Charlynne Cousins, MD Inpatient   08/03/2013 2037 08/09/2013 1629 Full Code [id number]  Etta Quill, DO ED   06/02/2013 2050 06/03/2013 2114 Full Code [id number]  Shanda Howells, MD ED   04/06/2013 0732 04/08/2013 1919 Full Code [id number]  Robbie Lis, MD Inpatient   04/05/2013 1703 04/06/2013 0732 Full Code [id number]  Robbie Lis, MD ED   11/30/2012 2228 12/12/2012 1934 Full Code [id number]  Domenic Polite, MD Inpatient   08/12/2012 1731 08/16/2012 2011 Full Code [id number]  Elvera Lennox, MD Inpatient   04/30/2012 1827 05/02/2012 1617 DNR [id number]  Samuella Cota, MD ED   03/07/2012 1013 03/08/2012 1847 Full Code [id number]  Samuella Cota, MD Inpatient   11/11/2011 0618 11/13/2011 1655 Full Code [id number]  Hardie Pulley, RN Inpatient   10/15/2011 2031 10/18/2011 1921 Full Code [id number]  Sid Falcon, RN Inpatient   07/29/2011 1310 07/31/2011 1257 Full Code [id number]  Shona Simpson, RN Inpatient    Questions for Most Recent Historical Code Status (Order [id number])    Question Answer Comment   In the event of cardiac or respiratory ARREST Do not call a "code blue"    In the event of cardiac or respiratory ARREST Do not perform Intubation, CPR, defibrillation or ACLS    In the event of cardiac or respiratory ARREST Use medication by any route, position, wound care, and other measures to relive pain and suffering. May use oxygen, suction and manual treatment of airway obstruction as needed for comfort.       Home/SNF/Other Home  Chief Complaint possible bacterial infection   Level of Care/Admitting Diagnosis ED Disposition    ED Disposition Condition Comment   Admit  Hospital Area: [place] [[id number]]  Level of Care: Telemetry [5]  Admit to tele based on following criteria: Monitor for Ischemic changes  Diagnosis: Nausea vomiting and diarrhea [686168]  Admitting Physician:  [name] [phone]  Attending Physician: [name] [phone]  Estimated length of stay: past midnight tomorrow  Certification:: I certify this patient will need inpatient services for at least 2 midnights  PT Class (Do Not Modify): Inpatient [101]  PT Acc Code (Do Not Modify): Private [1]       Medical History Past Medical History:  Diagnosis Date  . Anxiety   . Arthritis    "everywhere"  . Cataract    "both eyes; blood pressure's always too high to have them  fixed" (09/11/2014)  . Chronic lower back pain   . Claudication in peripheral vascular disease ([place])    02/16/10: Left CIA 9.0x28 Omnilink and REIA 8.0x40 seff expanding Zilver.   right subclavian artery stent 03/18/2008,  . Coronary artery disease   . Herniated lumbar intervertebral disc   . Hypertension   . IDDM (insulin dependent diabetes mellitus) ([place])   . Migraine    "used to have terrible migraines; stopped in the 1990's"  . Pacemaker   . Peripheral vascular disease ([place])   . Pneumonia X 2  . Renal disorder   . Renovascular hypertension     s/p left renal artery stent 12/2007.  S/P balloon angioplasty on 02/16/10 for ISR, BP was  controlled well since then.     . Stroke [facility]) 1999   Stroke and TIA in 1999 with right sided weakness, now with residual right arm weakness (09/11/2014)  . UTI (lower urinary tract infection) 02/2015    Allergies Allergies  Allergen Reactions  . Norvasc [Amlodipine Besylate] Swelling  . Peanut-Containing Drug Products Other (See Comments)    Stomach pain    IV Location/Drains/Wounds Patient Lines/Drains/Airways Status   Active Line/Drains/Airways    Name:   Placement date:   Placement time:   Site:   Days:   Implanted Port Left Chest   -    -    Chest      CVC Double Lumen 07/26/16 Right Internal jugular 14 cm   07/26/16    1202     421          Labs/Imaging Results for orders placed or performed during the hospital encounter of 09/20/17 (from the past 48 hour(s))  Comprehensive metabolic panel     Status: Abnormal   Collection Time: 09/20/17  2:44 PM  Result Value Ref Range   Sodium 140 135 - 145 mmol/L   Potassium 5.8 (H) 3.5 - 5.1 mmol/L    Comment: DELTA CHECK NOTED NO VISIBLE HEMOLYSIS REPEATED TO VERIFY    Chloride 108 98 - 111 mmol/L    Comment: Please note change in reference range.   CO2 23 22 - 32 mmol/L   Glucose, Bld 116 (H) 70 - 99 mg/dL    Comment: Please note change in reference range.   BUN 40 (H) 8 - 23 mg/dL     Comment: Please note change in reference range.   Creatinine, Ser 2.27 (H) 0.44 - 1.00 mg/dL   Calcium 7.8 (L) 8.9 - 10.3 mg/dL   Total Protein 5.1 (L) 6.5 - 8.1 g/dL   Albumin 1.9 (L) 3.5 - 5.0 g/dL   AST 35 15 - 41 U/L   ALT 24 0 - 44 U/L    Comment: Please note change in reference range.   Alkaline Phosphatase 131 (H) 38 - 126 U/L   Total Bilirubin 0.6 0.3 - 1.2 mg/dL   GFR calc non Af Amer 19 (L) >60 mL/min   GFR  calc Af Amer 22 (L) >60 mL/min    Comment: (NOTE) The eGFR has been calculated using the CKD EPI equation. This calculation has not been validated in all clinical situations. eGFR's persistently <60 mL/min signify possible Chronic Kidney Disease.    Anion gap 9 5 - 15    Comment: Performed at [facility], [place] [address] [place], [place] [address]  Lipase, blood     Status: None   Collection Time: 09/20/17  2:44 PM  Result Value Ref Range   Lipase 15 11 - 51 U/L    Comment: Performed at [facility], [place] [address] [place], [place] [address]  I-stat troponin, ED     Status: None   Collection Time: 09/20/17  2:47 PM  Result Value Ref Range   Troponin i, poc 0.01 0.00 - 0.08 ng/mL   Comment 3            Comment: Due to the release kinetics of cTnI, a negative result within the first hours of the onset of symptoms does not rule out myocardial infarction with certainty. If myocardial infarction is still suspected, repeat the test at appropriate intervals.   I-stat Chem 8, ED     Status: Abnormal   Collection Time: 09/20/17  2:49 PM  Result Value Ref Range   Sodium 135 135 - 145 mmol/L   Potassium 8.0 (HH) 3.5 - 5.1 mmol/L   Chloride 107 98 - 111 mmol/L   BUN 62 (H) 8 - 23 mg/dL   Creatinine, Ser 2.30 (H) 0.44 - 1.00 mg/dL   Glucose, Bld 112 (H) 70 - 99 mg/dL   Calcium, Ion 1.06 (L) 1.15 - 1.40 mmol/L   TCO2 24 22 - 32 mmol/L   Hemoglobin 14.3 12.0 - 15.0 g/dL   HCT 42.0 36.0 - 46.0 %   Comment NOTIFIED PHYSICIAN    I-Stat CG4 Lactic Acid, ED     Status: None   Collection Time: 09/20/17  2:49 PM  Result Value Ref Range   Lactic Acid, Venous 0.95 0.5 - 1.9 mmol/L  CBC with Differential/Platelet     Status: None   Collection Time: 09/20/17  3:25 PM  Result Value Ref Range   WBC 4.9 4.0 - 10.5 K/uL   RBC 3.99 3.87 - 5.11 MIL/uL   Hemoglobin 12.5 12.0 - 15.0 g/dL   HCT 36.8 36.0 - 46.0 %   MCV 92.2 78.0 - 100.0 fL   MCH 31.3 26.0 - 34.0 pg   MCHC 34.0 30.0 - 36.0 g/dL   RDW 13.4 11.5 - 15.5 %   Platelets 168 150 - 400 K/uL   Neutrophils Relative % 73 %   Neutro Abs 3.6 1.7 - 7.7 K/uL   Lymphocytes Relative 23 %   Lymphs Abs 1.1 0.7 - 4.0 K/uL   Monocytes Relative 3 %   Monocytes Absolute 0.2 0.1 - 1.0 K/uL   Eosinophils Relative 1 %   Eosinophils Absolute 0.0 0.0 - 0.7 K/uL   Basophils Relative 0 %   Basophils Absolute 0.0 0.0 - 0.1 K/uL    Comment: Performed at [facility], [place] [address], [place], [place] [address]  Brain natriuretic peptide     Status: Abnormal   Collection Time: 09/20/17  3:25 PM  Result Value Ref Range   B Natriuretic Peptide 643.0 (H) 0.0 - 100.0 pg/mL    Comment: Performed at [facility], [place] [address] [place], [place] [address]  Protime-INR  Status: None   Collection Time: 09/20/17  3:25 PM  Result Value Ref Range   Prothrombin Time 13.6 11.4 - 15.2 seconds   INR 1.05     Comment: Performed at [facility], [place] [address] [place], [place] [address]  I-Stat Chem 8, ED     Status: Abnormal   Collection Time: 09/20/17  3:32 PM  Result Value Ref Range   Sodium 138 135 - 145 mmol/L   Potassium 4.9 3.5 - 5.1 mmol/L   Chloride 108 98 - 111 mmol/L   BUN 38 (H) 8 - 23 mg/dL   Creatinine, Ser 2.20 (H) 0.44 - 1.00 mg/dL   Glucose, Bld 112 (H) 70 - 99 mg/dL   Calcium, Ion 1.08 (L) 1.15 - 1.40 mmol/L   TCO2 21 (L) 22 - 32 mmol/L   Hemoglobin 12.9 12.0 - 15.0 g/dL   HCT 38.0 36.0 - 46.0 %  Urinalysis,  Routine w reflex microscopic     Status: Abnormal   Collection Time: 09/20/17  7:33 PM  Result Value Ref Range   Color, Urine YELLOW YELLOW   APPearance HAZY (A) CLEAR   Specific Gravity, Urine 1.015 1.005 - 1.030   pH 5.0 5.0 - 8.0   Glucose, UA NEGATIVE NEGATIVE mg/dL   Hgb urine dipstick SMALL (A) NEGATIVE   Bilirubin Urine NEGATIVE NEGATIVE   Ketones, ur 5 (A) NEGATIVE mg/dL   Protein, ur 100 (A) NEGATIVE mg/dL   Nitrite NEGATIVE NEGATIVE   Leukocytes, UA SMALL (A) NEGATIVE   RBC / HPF 0-5 0 - 5 RBC/hpf   WBC, UA 11-20 0 - 5 WBC/hpf   Bacteria, UA RARE (A) NONE SEEN   Squamous Epithelial / LPF 0-5 0 - 5   Mucus PRESENT     Comment: Performed at [facility], [place] [address] [place], [place] [address]   Ct Abdomen Pelvis Wo Contrast  Result Date: 09/20/2017 CLINICAL DATA:  85 year old female recently treated for low potassium and bacterial infection. Possible UTI. Failure to thrive. Initial encounter. EXAM: CT ABDOMEN AND PELVIS WITHOUT CONTRAST TECHNIQUE: Multidetector CT imaging of the abdomen and pelvis was performed following the standard protocol without IV contrast. COMPARISON:  01/14/2015 CT. FINDINGS: Lower chest: Basilar atelectasis with small pleural effusions. Subtle basilar infiltrates secondary consideration. Heart size within normal limits. Biventricular pacer in place. Tip is in the region of the right atrium and right ventricle. Three vessel coronary artery calcification. Hepatobiliary: Taking into account limitation by non contrast imaging, no worrisome hepatic lesion. No calcified gallstones. Pancreas: Evidence of prior chronic pancreatitis with calcifications throughout the pancreas. Additionally, in the pancreatic tail region extending into the lesser sac and anterior to the splenic artery is a 5.2 x 2.8 x 3.3 cm abnormality (series 2, image 19 and series 6, image 71). This may represent pancreatic mass or abscess. Spleen: Tiny splenic calcification.   No splenomegaly. Adrenals/Urinary Tract: No hydronephrosis or obstructing stone. Right renal cyst. Taking into account limitation by non contrast imaging, no worrisome renal or adrenal abnormality. Noncontrast filled urinary bladder with tiny amount a gas which may be related to recent manipulation. Clinical correlation recommended. Small cystocele. Mild circumferential wall thickening. Stomach/Bowel: Long segment of colon is under distended with mild circumferential wall thickening. Third spacing of fluid limits evaluation for the detection of extraluminal bowel inflammatory process. In the proper clinical setting, colitis is a consideration. Stomach under distended and evaluation limited. Decompressed gastric walls appear slightly thickened. Gastritis not excluded. Limited evaluation of  small bowel secondary to lack of fat planes. Vascular/Lymphatic: Prominent aortic calcification without aneurysm. Prominent aortic branch vessel atherosclerotic changes and iliac artery/femoral artery atherosclerotic changes with probable areas of moderate to marked narrowing. Scattered small lymph nodes. Reproductive: No worrisome adnexal mass. Other: No free air pneumatosis or portal venous gas. No bowel containing hernia. Third spacing of fluid. Musculoskeletal: Fusion L5-S1. Chronic compression deformity L1-L2 and L4 without change. Scoliosis and degenerative changes. Hip joint degenerative changes. IMPRESSION: Third spacing of fluid, lack of fat planes and lack of contrast limited evaluation. Evidence of chronic pancreatitis. Additionally, in the pancreatic tail region extending into the lesser sac and anterior to the splenic artery is a 5.2 x 2.8 x 3.3 cm abnormality (series 2, image 19 and series 6, image 71). This may represent a pancreatic mass or abscess. Diffuse circumferential colonic wall thickening may be related to under distension and third spacing of fluid although colitis not excluded in proper clinical setting.  Under distended stomach with gastric wall thickening and therefore gastritis not excluded in proper clinical setting. Aortic Atherosclerosis (ICD10-I70.0). Prominent aortic branch vessel atherosclerotic changes and iliac artery/femoral artery atherosclerotic changes with probable areas of moderate to marked narrowing. Bibasilar atelectasis with small pleural effusions. Subtle basilar infiltrates is a secondary consideration. Electronically Signed   By: Genia Del M.D.   On: 09/20/2017 18:51   Dg Chest Port 1 View  Result Date: 09/20/2017 CLINICAL DATA:  84 year old with acute mental status changes, generalized weakness and failure to thrive. EXAM: PORTABLE CHEST 1 VIEW COMPARISON:  05/06/2017, 07/25/2016 and earlier. FINDINGS: Cardiac silhouette UPPER normal in size for AP portable technique, unchanged. Thoracic aorta atherosclerotic. Hilar and mediastinal contours otherwise unremarkable. Suboptimal inspiration with atelectasis in the lung bases, LEFT greater than RIGHT. Lungs otherwise clear. Pulmonary vascularity normal. No visible pleural effusions. Severe generalized osseous demineralization. Severe degenerative changes involving the RIGHT shoulder. Vascular stent in what I suspect is the RIGHT common carotid artery. IMPRESSION: Suboptimal inspiration which accounts for bibasilar atelectasis, LEFT greater than RIGHT. No acute cardiopulmonary disease otherwise. Electronically Signed   By: Evangeline Dakin M.D.   On: 09/20/2017 14:40   [place] Ekg Site Rite  Result Date: 09/20/2017 If Site Rite image not attached, placement could not be confirmed due to current cardiac rhythm.   Pending Labs Unresulted Labs (From admission, onward)   Start     Ordered   09/20/17 1421  CBC with Differential  Once,   STAT     09/20/17 1422      Vitals/Pain Today's Vitals   09/20/17 2034 09/20/17 2039 09/20/17 2149 09/20/17 2150  BP: (!) 182/74 (!) 182/74 (!) 162/62 (!) 162/62  Pulse: 61 61 62   Resp: 20  18    Temp:      TempSrc:      SpO2: 100% 99% 99%   Weight:      Height:      PainSc:        Isolation Precautions No active isolations  Medications Medications  ondansetron (ZOFRAN) injection 4 mg (4 mg Intravenous Not Given 09/20/17 2059)  famotidine (PEPCID) IVPB 20 mg premix (20 mg Intravenous Not Given 09/20/17 2210)  sodium chloride 0.9 % bolus 1,000 mL (1,000 mLs Intravenous Not Given 09/20/17 2209)  0.9 %  sodium chloride infusion ( Intravenous Hold 09/20/17 1722)  acetaminophen (TYLENOL) tablet 1,000 mg (1,000 mg Oral Given 09/20/17 1723)  gi cocktail (Maalox,Lidocaine,Donnatal) (30 mLs Oral Given 09/20/17 2001)    Mobility walks with assistance

## 2017-09-20 NOTE — ED Notes (Signed)
Patient family refused IV team attempts to establish an IV. MD made aware.

## 2017-09-20 NOTE — ED Notes (Signed)
Informed pt/family that RN Rolla Plate is communicating with provider about pain medicine

## 2017-09-20 NOTE — H&P (Signed)
History and Physical    Carmen [name]:[id number] DOB: 1933/07/17 DOA: 09/20/2017  PCP: Glendale Chard, MD  Patient coming from: Home.  Chief Complaint: Nausea vomiting.  HPI: Carmen [name] is a 84 y.o. female with history of diabetes mellitus type 2 last hemoglobin A1c last week was 16, sick sinus syndrome status post pacemaker placement, hypertension, chronic kidney disease stage IV, peripheral arterial disease who was admitted last week for severe hypokalemia and hyperosmolar status secondary to uncontrolled diabetes was discharged 2 days ago to home and was brought back today because of persistent nausea vomiting since morning.  Has been doing some abdominal discomfort denies any chest pain or shortness of breath.  Denies any diarrhea.  ED Course: In the ER patient's lab work comparable to the recent discharge labs.  CT abdomen and pelvis done shows which is concerning for chronic pancreatitis with possible pancreatic mass versus abscess.  Patient admitted for further management of persistent nausea vomiting and abnormal CAT scan finding.  Review of Systems: As per HPI, rest all negative.   Past Medical History:  Diagnosis Date  . Anxiety   . Arthritis    "everywhere"  . Cataract    "both eyes; blood pressure's always too high to have them fixed" (09/11/2014)  . Chronic lower back pain   . Claudication in peripheral vascular disease ([place])    02/16/10: Left CIA 9.0x28 Omnilink and REIA 8.0x40 seff expanding Zilver.   right subclavian artery stent 03/18/2008,  . Coronary artery disease   . Herniated lumbar intervertebral disc   . Hypertension   . IDDM (insulin dependent diabetes mellitus) ([place])   . Migraine    "used to have terrible migraines; stopped in the 1990's"  . Pacemaker   . Peripheral vascular disease ([place])   . Pneumonia X 2  . Renal disorder   . Renovascular hypertension     s/p left renal artery stent 12/2007.  S/P balloon angioplasty on 02/16/10 for ISR, BP was   controlled well since then.     . Stroke [facility]) 1999   Stroke and TIA in 1999 with right sided weakness, now with residual right arm weakness (09/11/2014)  . UTI (lower urinary tract infection) 02/2015    Past Surgical History:  Procedure Laterality Date  . ABDOMINAL ANGIOGRAM N/A 07/06/2011   Procedure: ABDOMINAL ANGIOGRAM;  Surgeon: Laverda Page, MD;  Location: [facility] CATH LAB;  Service: Cardiovascular;  Laterality: N/A;  . APPENDECTOMY    . BACK SURGERY    . CAROTID ENDARTERECTOMY Left 1991    Stroke and TIA in 1999 with right sided weakness, now with residual right arm weakness  . CARPAL TUNNEL RELEASE Left   . COLONOSCOPY  07/30/2011   Procedure: COLONOSCOPY;  Surgeon: Inda Castle, MD;  Location: [place];  Service: Endoscopy;  Laterality: N/A;  gi bleed  . ESOPHAGOGASTRODUODENOSCOPY  07/30/2011   Procedure: ESOPHAGOGASTRODUODENOSCOPY (EGD);  Surgeon: Inda Castle, MD;  Location: [place];  Service: Endoscopy;  Laterality: N/A;  . EXCISIONAL HEMORRHOIDECTOMY    . INSERT / REPLACE / REMOVE PACEMAKER    . IR FLUORO GUIDE CV LINE RIGHT  07/26/2016  . IR FLUORO GUIDE CV LINE RIGHT  09/16/2017  . IR US GUIDE VASC ACCESS RIGHT  07/26/2016  . IR US GUIDE VASC ACCESS RIGHT  09/16/2017  . LOWER EXTREMITY ANGIOGRAM Right 05/29/2012   Procedure: LOWER EXTREMITY ANGIOGRAM;  Surgeon: Laverda Page, MD;  Location: [facility] CATH LAB;  Service: Cardiovascular;  Laterality:  Right;  Marland Kitchen LUMBAR LAMINECTOMY     'herniated disc"  . PERIPHERAL VASCULAR CATHETERIZATION N/A 11/05/2014   Procedure: Abdominal Aortogram;  Surgeon: Serafina Mitchell, MD;  Location: [facility] CV LAB;  Service: Cardiovascular;  Laterality: N/A;  . PERMANENT PACEMAKER INSERTION Left 06/28/2011   Procedure: PERMANENT PACEMAKER INSERTION;  Surgeon: Deboraha Sprang, MD;  Location: [facility] CATH LAB;  Service: Cardiovascular;  Laterality: Left;  . RENAL ANGIOGRAM N/A 07/06/2011   Procedure: RENAL ANGIOGRAM;  Surgeon: Laverda Page,  MD;  Location: [facility] CATH LAB;  Service: Cardiovascular;  Laterality: N/A;  . TONSILLECTOMY    . URETERAL STENT PLACEMENT    . VAGINAL HYSTERECTOMY       reports that she has quit smoking. Her smoking use included cigarettes. She smoked 0.00 packs per day for 70.00 years. She has never used smokeless tobacco. She reports that she does not drink alcohol or use drugs.  Allergies  Allergen Reactions  . Norvasc [Amlodipine Besylate] Swelling  . Peanut-Containing Drug Products Other (See Comments)    Stomach pain    Family History  Problem Relation Age of Onset  . Cancer Father        Colon cancer  . Heart attack Mother     Prior to Admission medications   Medication Sig Start Date End Date Taking? Authorizing Provider  aspirin EC 81 MG tablet Take 81 mg by mouth daily.   Yes [provider]  calcium-vitamin D (OSCAL WITH D) 500-200 MG-UNIT tablet Take 2 tablets by mouth 4 (four) times daily. 07/27/16  Yes Eugenie Filler, MD  diclofenac sodium (VOLTAREN) 1 % GEL Apply 2 g topically 4 (four) times daily.  04/28/17  Yes [provider]  feeding supplement, GLUCERNA SHAKE, (GLUCERNA SHAKE) LIQD Take 237 mLs by mouth 3 (three) times daily between meals. 09/19/17  Yes Eugenie Filler, MD  hydrALAZINE (APRESOLINE) 10 MG tablet Take 1 tablet (10 mg total) by mouth 3 (three) times daily. 05/10/17  Yes Hosie Poisson, MD  insulin NPH-regular Human (NOVOLIN 70/30) (70-30) 100 UNIT/ML injection Inject 5 Units into the skin 2 (two) times daily. 10/30/15  Yes Short, Noah Delaine, MD  isosorbide mononitrate (IMDUR) 120 MG 24 hr tablet Take 1 tablet (120 mg total) by mouth daily. Resume in 2 days. 05/10/17  Yes Hosie Poisson, MD  metoprolol tartrate (LOPRESSOR) 25 MG tablet Take 25 mg by mouth 2 (two) times daily.   Yes [provider]  Omega-3 Fatty Acids (FISH OIL) 1000 MG CAPS Take 1,000 mg by mouth 2 (two) times daily.    Yes [provider]  Vitamin D, Ergocalciferol,  (DRISDOL) 50000 units CAPS capsule Take 1 capsule (50,000 Units total) by mouth every 7 (seven) days. 05/17/17  Yes Hosie Poisson, MD  acetaminophen (TYLENOL) 325 MG tablet Take 2 tablets (650 mg total) by mouth every 4 (four) hours as needed for headache or mild pain. 01/15/16   Regalado, Belkys A, MD  bisacodyl (DULCOLAX) 5 MG EC tablet Take 1 tablet (5 mg total) by mouth daily as needed for moderate constipation. 09/19/17   Eugenie Filler, MD  aliskiren (TEKTURNA) 150 MG tablet Take 150 mg by mouth daily.  05/29/11  [provider]  gabapentin (NEURONTIN) 100 MG capsule Take 1 capsule (100 mg total) by mouth 3 (three) times daily. 04/06/11 05/29/11  Nita Sells, MD    Physical Exam: Vitals:   09/20/17 2034 09/20/17 2039 09/20/17 2149 09/20/17 2150  BP: (!) 182/74 (!) 182/74 Marland Kitchen)  162/62 (!) 162/62  Pulse: 61 61 62   Resp: 20  18   Temp:      TempSrc:      SpO2: 100% 99% 99%   Weight:      Height:          Constitutional: Moderately built and nourished. Vitals:   09/20/17 2034 09/20/17 2039 09/20/17 2149 09/20/17 2150  BP: (!) 182/74 (!) 182/74 (!) 162/62 (!) 162/62  Pulse: 61 61 62   Resp: 20  18   Temp:      TempSrc:      SpO2: 100% 99% 99%   Weight:      Height:       Eyes: Anicteric no pallor. ENMT: No discharge from the ears eyes nose or mouth. Neck: No mass felt.  No neck rigidity. Respiratory: No rhonchi or crepitations. Cardiovascular: S1-S2 heard no murmurs appreciated. Abdomen: Soft nontender bowel sounds present. Musculoskeletal: Mild edema of the left upper extremity. Skin: No rash. Neurologic: Alert awake oriented to time and place.  Right-sided weakness from previous stroke.   Psychiatric: Appears normal normal affect.   Labs on Admission: I have personally reviewed following labs and imaging studies  CBC: Recent Labs  Lab 09/16/17 0113 09/18/17 0552 09/19/17 0409 09/20/17 1449 09/20/17 1525 09/20/17 1532  WBC 5.7 5.6 6.2  --  4.9   --   NEUTROABS 4.5  --   --   --  3.6  --   HGB 13.7 12.2 12.3 14.3 12.5 12.9  HCT 38.1 34.6* 36.3 42.0 36.8 38.0  MCV 89.2 90.3 91.9  --  92.2  --   PLT 183 155 161  --  168  --    Basic Metabolic Panel: Recent Labs  Lab 09/16/17 0636  09/17/17 0241 09/17/17 0530 09/17/17 0535 09/18/17 0552 09/19/17 0409 09/20/17 1444 09/20/17 1449 09/20/17 1532  NA 133*   < > 141  --  140 139 140 140 135 138  K 3.6   < > 4.1  --  3.5 4.0 4.2 5.8* 8.0* 4.9  CL 88*   < > 103  --  103 107 109 108 107 108  CO2 29   < > 29  --  30 26 25 23   --   --   GLUCOSE 504*   < > 67*  --  80 92 68* 116* 112* 112*  BUN 45*   < > 41*  --  39* 37* 39* 40* 62* 38*  CREATININE 3.14*   < > 2.54*  --  2.40* 2.37* 2.24* 2.27* 2.30* 2.20*  CALCIUM 7.2*   < > 7.2*  --  7.3* 7.7* 7.7* 7.8*  --   --   MG 1.7  --   --  1.8  --  2.8*  --   --   --   --    < > = values in this interval not displayed.   GFR: Estimated Creatinine Clearance: 12.8 mL/min (A) (by C-G formula based on SCr of 2.2 mg/dL (H)). Liver Function Tests: Recent Labs  Lab 09/16/17 0636 09/20/17 1444  AST 17 35  ALT 13 24  ALKPHOS 107 131*  BILITOT 0.4 0.6  PROT 5.6* 5.1*  ALBUMIN 2.1* 1.9*   Recent Labs  Lab 09/20/17 1444  LIPASE 15   No results for input(s): AMMONIA in the last 168 hours. Coagulation Profile: Recent Labs  Lab 09/20/17 1525  INR 1.05   Cardiac Enzymes: Recent Labs  Lab 09/16/17 0636 09/16/17  8206 09/16/17 1659 09/16/17 2129  TROPONINI 0.09* 0.08* 0.07* 0.06*   BNP (last 3 results) No results for input(s): PROBNP in the last 8760 hours. HbA1C: No results for input(s): HGBA1C in the last 72 hours. CBG: Recent Labs  Lab 09/18/17 2339 09/19/17 0509 09/19/17 0604 09/19/17 0827 09/19/17 1151  GLUCAP 83 64* 98 153* 127*   Lipid Profile: No results for input(s): CHOL, HDL, LDLCALC, TRIG, CHOLHDL, LDLDIRECT in the last 72 hours. Thyroid Function Tests: No results for input(s): TSH, T4TOTAL, FREET4,  T3FREE, THYROIDAB in the last 72 hours. Anemia Panel: No results for input(s): VITAMINB12, FOLATE, FERRITIN, TIBC, IRON, RETICCTPCT in the last 72 hours. Urine analysis:    Component Value Date/Time   COLORURINE YELLOW 09/20/2017 1933   APPEARANCEUR HAZY (A) 09/20/2017 1933   LABSPEC 1.015 09/20/2017 1933   PHURINE 5.0 09/20/2017 1933   GLUCOSEU NEGATIVE 09/20/2017 1933   HGBUR SMALL (A) 09/20/2017 1933   BILIRUBINUR NEGATIVE 09/20/2017 1933   KETONESUR 5 (A) 09/20/2017 1933   PROTEINUR 100 (A) 09/20/2017 1933   UROBILINOGEN 0.2 01/13/2015 2034   NITRITE NEGATIVE 09/20/2017 1933   LEUKOCYTESUR SMALL (A) 09/20/2017 1933   Sepsis Labs: @LABRCNTIP (procalcitonin:4,lacticidven:4) ) Recent Results (from the past 240 hour(s))  MRSA PCR Screening     Status: None   Collection Time: 09/16/17 11:13 AM  Result Value Ref Range Status   MRSA by PCR NEGATIVE NEGATIVE Final    Comment:        The GeneXpert MRSA Assay (FDA approved for NASAL specimens only), is one component of a comprehensive MRSA colonization surveillance program. It is not intended to diagnose MRSA infection nor to guide or monitor treatment for MRSA infections. Performed at [facility], [place] [address], [place], [place] [address]   Culture, Urine     Status: None   Collection Time: 09/16/17 12:19 PM  Result Value Ref Range Status   Specimen Description   Final    URINE, CATHETERIZED Performed at [facility] [address] [place], [place] [address]    Special Requests   Final    NONE Performed at [facility], [place] [address], [place], [place] [address]    Culture   Final    NO GROWTH Performed at [facility], [place] [address] [place], [place] [address]    Report Status 09/17/2017 FINAL  Final     Radiological Exams on Admission: Ct Abdomen Pelvis Wo Contrast  Result Date: 09/20/2017 CLINICAL DATA:  84 year old female recently treated for  low potassium and bacterial infection. Possible UTI. Failure to thrive. Initial encounter. EXAM: CT ABDOMEN AND PELVIS WITHOUT CONTRAST TECHNIQUE: Multidetector CT imaging of the abdomen and pelvis was performed following the standard protocol without IV contrast. COMPARISON:  01/14/2015 CT. FINDINGS: Lower chest: Basilar atelectasis with small pleural effusions. Subtle basilar infiltrates secondary consideration. Heart size within normal limits. Biventricular pacer in place. Tip is in the region of the right atrium and right ventricle. Three vessel coronary artery calcification. Hepatobiliary: Taking into account limitation by non contrast imaging, no worrisome hepatic lesion. No calcified gallstones. Pancreas: Evidence of prior chronic pancreatitis with calcifications throughout the pancreas. Additionally, in the pancreatic tail region extending into the lesser sac and anterior to the splenic artery is a 5.2 x 2.8 x 3.3 cm abnormality (series 2, image 19 and series 6, image 71). This may represent pancreatic mass or abscess. Spleen: Tiny splenic calcification.  No splenomegaly. Adrenals/Urinary Tract: No hydronephrosis or obstructing  stone. Right renal cyst. Taking into account limitation by non contrast imaging, no worrisome renal or adrenal abnormality. Noncontrast filled urinary bladder with tiny amount a gas which may be related to recent manipulation. Clinical correlation recommended. Small cystocele. Mild circumferential wall thickening. Stomach/Bowel: Long segment of colon is under distended with mild circumferential wall thickening. Third spacing of fluid limits evaluation for the detection of extraluminal bowel inflammatory process. In the proper clinical setting, colitis is a consideration. Stomach under distended and evaluation limited. Decompressed gastric walls appear slightly thickened. Gastritis not excluded. Limited evaluation of small bowel secondary to lack of fat planes. Vascular/Lymphatic:  Prominent aortic calcification without aneurysm. Prominent aortic branch vessel atherosclerotic changes and iliac artery/femoral artery atherosclerotic changes with probable areas of moderate to marked narrowing. Scattered small lymph nodes. Reproductive: No worrisome adnexal mass. Other: No free air pneumatosis or portal venous gas. No bowel containing hernia. Third spacing of fluid. Musculoskeletal: Fusion L5-S1. Chronic compression deformity L1-L2 and L4 without change. Scoliosis and degenerative changes. Hip joint degenerative changes. IMPRESSION: Third spacing of fluid, lack of fat planes and lack of contrast limited evaluation. Evidence of chronic pancreatitis. Additionally, in the pancreatic tail region extending into the lesser sac and anterior to the splenic artery is a 5.2 x 2.8 x 3.3 cm abnormality (series 2, image 19 and series 6, image 71). This may represent a pancreatic mass or abscess. Diffuse circumferential colonic wall thickening may be related to under distension and third spacing of fluid although colitis not excluded in proper clinical setting. Under distended stomach with gastric wall thickening and therefore gastritis not excluded in proper clinical setting. Aortic Atherosclerosis (ICD10-I70.0). Prominent aortic branch vessel atherosclerotic changes and iliac artery/femoral artery atherosclerotic changes with probable areas of moderate to marked narrowing. Bibasilar atelectasis with small pleural effusions. Subtle basilar infiltrates is a secondary consideration. Electronically Signed   By: Genia Del M.D.   On: 09/20/2017 18:51   Dg Chest Port 1 View  Result Date: 09/20/2017 CLINICAL DATA:  84 year old with acute mental status changes, generalized weakness and failure to thrive. EXAM: PORTABLE CHEST 1 VIEW COMPARISON:  05/06/2017, 07/25/2016 and earlier. FINDINGS: Cardiac silhouette UPPER normal in size for AP portable technique, unchanged. Thoracic aorta atherosclerotic. Hilar and  mediastinal contours otherwise unremarkable. Suboptimal inspiration with atelectasis in the lung bases, LEFT greater than RIGHT. Lungs otherwise clear. Pulmonary vascularity normal. No visible pleural effusions. Severe generalized osseous demineralization. Severe degenerative changes involving the RIGHT shoulder. Vascular stent in what I suspect is the RIGHT common carotid artery. IMPRESSION: Suboptimal inspiration which accounts for bibasilar atelectasis, LEFT greater than RIGHT. No acute cardiopulmonary disease otherwise. Electronically Signed   By: Evangeline Dakin M.D.   On: 09/20/2017 14:40   [place] Ekg Site Rite  Result Date: 09/20/2017 If Site Rite image not attached, placement could not be confirmed due to current cardiac rhythm.   EKG: Independently reviewed.  Paced rhythm.  Assessment/Plan Principal Problem:   Nausea & vomiting Active Problems:   History of CVA (cerebrovascular accident) with associated mild right upper extremity hemiplegia   CKD (chronic kidney disease), stage III (HCC)   HTN (hypertension)   Failure to thrive in adult   Uncontrolled type 2 diabetes mellitus with hyperglycemia (HCC)   Pancreatic mass    1. Nausea vomiting with CAT scan findings concerning for chronic pancreatitis with possible pancreatic mass versus abscess.  Patient is afebrile.  We will get blood cultures.  Started on full liquid diet gentle hydration for now and consult gastroenterologist  in a.m. 2. Diabetes mellitus type 2 with recent admission for hyperosmolar status with hemoglobin A1c was around 16.  Patient is on NPH 5 units twice daily.  Closely follow CBGs since patient has been having some nausea vomiting. 3. Hypertension on hydralazine Imdur and metoprolol. 4. Chronic kidney disease stage IV with recent worsening which is improved.  Follow metabolic panel. 5. History of sick sinus syndrome status post pacemaker placement. 6. History of peripheral vascular disease on antiplatelet  agents. 7. History of CVA with right-sided weakness. 8. Possible UTI -we will check urine cultures.  Will start antibiotics after blood cultures and urine cultures are confirmed.  Since patient's left upper extremity is mildly swollen I have ordered Dopplers. Patient has difficult IV access.    DVT prophylaxis: SCDs. Code Status: DNR. Family Communication: Patient's daughter and granddaughter. Disposition Plan: To be determined. Consults called: None. Admission status: Inpatient.   [name] MD [facility] Pager [phone].  If 7PM-7AM, please contact night-coverage [email or web address] Password [email or web address]  09/20/2017, 11:06 PM

## 2017-09-20 NOTE — ED Provider Notes (Signed)
[facility] DEPT Provider Note   CSN: [id number] Arrival date & time: 09/20/17  1341     History   Chief Complaint Chief Complaint  Patient presents with  . Fatigue    HPI Carmen [name] is a 84 y.o. female.  HPI Most history is from the patient's daughter.  She reports that the patient was discharged yesterday and she was not contacted.  She reports that her other family member went and picked her up and did not recognize that the patient had not improved.  Patient's daughter reports that she has not been eating and ate very little prior to her discharge.  She reports she is been extremely weak.  She reports she was very dehydrated and still has not been able to eat or drink since home.  She reports she seemed weaker and less active yet again today and thus called EMS.  The patient denies immediate pain complaints but her daughter explains that she is got chronic pain in her legs back and her right arm where she had a stroke.  Patient's main complaint is that she cannot remember anything about yesterday.  I also called the patient's son Montine Circle and spoke with him.  He reports that he has mother vomited once today and complained of abdominal pain, reporting that she needed to go to the hospital.  He reports when she vomited the symptoms seem to improve. Past Medical History:  Diagnosis Date  . Anxiety   . Arthritis    "everywhere"  . Cataract    "both eyes; blood pressure's always too high to have them fixed" (09/11/2014)  . Chronic lower back pain   . Claudication in peripheral vascular disease ([place])    02/16/10: Left CIA 9.0x28 Omnilink and REIA 8.0x40 seff expanding Zilver.   right subclavian artery stent 03/18/2008,  . Coronary artery disease   . Herniated lumbar intervertebral disc   . Hypertension   . IDDM (insulin dependent diabetes mellitus) ([place])   . Migraine    "used to have terrible migraines; stopped in the 1990's"  . Pacemaker   .  Peripheral vascular disease ([place])   . Pneumonia X 2  . Renal disorder   . Renovascular hypertension     s/p left renal artery stent 12/2007.  S/P balloon angioplasty on 02/16/10 for ISR, BP was  controlled well since then.     . Stroke [facility]) 1999   Stroke and TIA in 1999 with right sided weakness, now with residual right arm weakness (09/11/2014)  . UTI (lower urinary tract infection) 02/2015    Patient Active Problem List   Diagnosis Date Noted  . Uncontrolled type 2 diabetes mellitus with hyperglycemia ([place]) 09/16/2017  . ARF (acute renal failure) ([place]) 09/16/2017  . Hyperosmolar non-ketotic state in patient with type 2 diabetes mellitus ([place]) 09/16/2017  . Acute renal failure superimposed on stage 4 chronic kidney disease ([place]) 09/16/2017  . Uncontrolled type 2 DM with hyperosmolar nonketotic hyperglycemia ([place]) 09/16/2017  . Diarrhea in adult patient   . Influenza 05/06/2017  . Hyperglycemia 07/25/2016  . Acute metabolic encephalopathy 40/98/1191  . Hypocalcemia 07/25/2016  . Elevated troponin   . Weakness   . Normal anion gap metabolic acidosis 47/82/9562  . Chest pain 01/13/2016  . Hypoglycemia 10/28/2015  . DNR (do not resuscitate) discussion 02/27/2015  . Palliative care encounter 02/27/2015  . Acute on chronic kidney failure ([place])   . Constipation   . Dehydration, moderate 02/26/2015  . Failure to thrive  in adult 02/26/2015  . Acute on chronic renal failure ([place]) 02/26/2015  . Acute cystitis without hematuria   . AKI (acute kidney injury) ([place]) 01/14/2015  . Metabolic acidosis 83/41/9622  . Urinary retention 11/02/2014  . Lower limb ischemia; LLE 11/02/2014  . Hyperkalemia 11/02/2014  . Acute renal failure syndrome ([place])   . Hematuria   . Acute encephalopathy   . Altered mental status 10/28/2014  . Confusion 10/28/2014  . Pulmonary edema 09/11/2014  . Acute respiratory failure with hypoxia ([place]) 09/11/2014  . Acute diastolic heart failure ([place]) 09/11/2014  .  Protein-calorie malnutrition, severe ([place]) 08/06/2013  . Hypertensive crisis 08/05/2013  . Hypertensive urgency 08/03/2013  . Weakness generalized 04/06/2013  . Dehydration 04/06/2013  . Transaminitis 04/06/2013  . Type 2 diabetes mellitus with hyperosmolar nonketotic hyperglycemia ([place]) 04/05/2013  . CKD (chronic kidney disease), stage III ([place]) 04/05/2013  . HTN (hypertension) 04/05/2013  . Dyslipidemia 04/05/2013  . Abnormal LFTs 04/05/2013  . Cerebral infarction ([place]) 10/15/2011  . [employer] 10/11/2011  . History of CVA (cerebrovascular accident) with associated mild right upper extremity hemiplegia 07/30/2011  . Retrovascular hypertension status post left renal artery stent 2009 and balloon angioplasty for in-stent restenosis 2011 04/05/2011  . Hypokalemia 03/28/2011  . Tobacco abuse 03/28/2011  . Diabetes mellitus ([place]) 03/28/2011  . Peripheral neuropathy 03/28/2011    Past Surgical History:  Procedure Laterality Date  . ABDOMINAL ANGIOGRAM N/A 07/06/2011   Procedure: ABDOMINAL ANGIOGRAM;  Surgeon: Laverda Page, MD;  Location: [facility] CATH LAB;  Service: Cardiovascular;  Laterality: N/A;  . APPENDECTOMY    . BACK SURGERY    . CAROTID ENDARTERECTOMY Left 1991    Stroke and TIA in 1999 with right sided weakness, now with residual right arm weakness  . CARPAL TUNNEL RELEASE Left   . COLONOSCOPY  07/30/2011   Procedure: COLONOSCOPY;  Surgeon: Inda Castle, MD;  Location: [facility];  Service: Endoscopy;  Laterality: N/A;  gi bleed  . ESOPHAGOGASTRODUODENOSCOPY  07/30/2011   Procedure: ESOPHAGOGASTRODUODENOSCOPY (EGD);  Surgeon: Inda Castle, MD;  Location: [facility];  Service: Endoscopy;  Laterality: N/A;  . EXCISIONAL HEMORRHOIDECTOMY    . INSERT / REPLACE / REMOVE PACEMAKER    . IR FLUORO GUIDE CV LINE RIGHT  07/26/2016  . IR FLUORO GUIDE CV LINE RIGHT  09/16/2017  . IR US GUIDE VASC ACCESS RIGHT  07/26/2016  . IR US GUIDE VASC ACCESS RIGHT  09/16/2017   . LOWER EXTREMITY ANGIOGRAM Right 05/29/2012   Procedure: LOWER EXTREMITY ANGIOGRAM;  Surgeon: Laverda Page, MD;  Location: [facility] CATH LAB;  Service: Cardiovascular;  Laterality: Right;  . LUMBAR LAMINECTOMY     'herniated disc"  . PERIPHERAL VASCULAR CATHETERIZATION N/A 11/05/2014   Procedure: Abdominal Aortogram;  Surgeon: Serafina Mitchell, MD;  Location: [facility] CV LAB;  Service: Cardiovascular;  Laterality: N/A;  . PERMANENT PACEMAKER INSERTION Left 06/28/2011   Procedure: PERMANENT PACEMAKER INSERTION;  Surgeon: Deboraha Sprang, MD;  Location: [facility] CATH LAB;  Service: Cardiovascular;  Laterality: Left;  . RENAL ANGIOGRAM N/A 07/06/2011   Procedure: RENAL ANGIOGRAM;  Surgeon: Laverda Page, MD;  Location: [facility] CATH LAB;  Service: Cardiovascular;  Laterality: N/A;  . TONSILLECTOMY    . URETERAL STENT PLACEMENT    . VAGINAL HYSTERECTOMY       OB History   None      Home Medications    Prior to Admission medications   Medication Sig Start Date End Date Taking? Authorizing Provider  acetaminophen (TYLENOL) 325 MG tablet Take 2 tablets (650 mg total) by mouth every 4 (four) hours as needed for headache or mild pain. 01/15/16   Regalado, Belkys A, MD  aspirin EC 81 MG tablet Take 81 mg by mouth daily.    [provider]  bisacodyl (DULCOLAX) 5 MG EC tablet Take 1 tablet (5 mg total) by mouth daily as needed for moderate constipation. 09/19/17   Eugenie Filler, MD  calcium-vitamin D (OSCAL WITH D) 500-200 MG-UNIT tablet Take 2 tablets by mouth 4 (four) times daily. Patient not taking: Reported on 09/16/2017 07/27/16   Eugenie Filler, MD  diclofenac sodium (VOLTAREN) 1 % GEL Apply 2 g topically 4 (four) times daily.  04/28/17   [provider]  feeding supplement, GLUCERNA SHAKE, (GLUCERNA SHAKE) LIQD Take 237 mLs by mouth 3 (three) times daily between meals. 09/19/17   Eugenie Filler, MD  hydrALAZINE (APRESOLINE) 10 MG tablet Take 1 tablet (10 mg total) by mouth  3 (three) times daily. 05/10/17   Hosie Poisson, MD  insulin NPH-regular Human (NOVOLIN 70/30) (70-30) 100 UNIT/ML injection Inject 5 Units into the skin 2 (two) times daily. 10/30/15   Janece Canterbury, MD  isosorbide mononitrate (IMDUR) 120 MG 24 hr tablet Take 1 tablet (120 mg total) by mouth daily. Resume in 2 days. 05/10/17   Hosie Poisson, MD  metoprolol tartrate (LOPRESSOR) 25 MG tablet Take 25 mg by mouth 2 (two) times daily.    [provider]  Omega-3 Fatty Acids (FISH OIL) 1000 MG CAPS Take 1,000 mg by mouth 2 (two) times daily.     [provider]  Vitamin D, Ergocalciferol, (DRISDOL) 50000 units CAPS capsule Take 1 capsule (50,000 Units total) by mouth every 7 (seven) days. 05/17/17   Hosie Poisson, MD  aliskiren (TEKTURNA) 150 MG tablet Take 150 mg by mouth daily.  05/29/11  [provider]  gabapentin (NEURONTIN) 100 MG capsule Take 1 capsule (100 mg total) by mouth 3 (three) times daily. 04/06/11 05/29/11  Nita Sells, MD    Family History Family History  Problem Relation Age of Onset  . Cancer Father        Colon cancer  . Heart attack Mother     Social History Social History   Tobacco Use  . Smoking status: Former Smoker    Packs/day: 0.00    Years: 70.00    Pack years: 0.00    Types: Cigarettes  . Smokeless tobacco: Never Used  . Tobacco comment: 09/11/2014 "I used to smoke very heavy; down to 1 cigarette/day"  Substance Use Topics  . Alcohol use: No    Comment: "stopped drinking back in the 1970's"  . Drug use: No     Allergies   Norvasc [amlodipine besylate] and Peanut-containing drug products   Review of Systems Review of Systems 10 Systems reviewed and are negative for acute change except as noted in the HPI.   Physical Exam Updated Vital Signs BP (!) 168/68 (BP Location: Left Wrist)   Pulse 61   Temp 97.6 F (36.4 C) (Oral)   Resp 16   SpO2 100%   Physical Exam General: Recent is resting and sleeping.  No  respiratory distress at rest.  She awakens to light stimulus.  She answers some questions appropriately.  Very deconditioned and debilitated in general appearance. Head: Normocephalic atraumatic mucous membranes moist Heart: Regular no gross rub murmur gallop Lungs: Adequate airflow basilar wheeze Abdomen: Soft and nontender Extremities: 1+ peripheral edema,  no cellulitis. Neurologic: Patient awakens to answer questions.  She is cheerful.  Old flaccid paralysis of right upper extremity.  Can use left upper extremity. Skin warm and dry:  ED Treatments / Results  Labs (all labs ordered are listed, but only abnormal results are displayed) Labs Reviewed  COMPREHENSIVE METABOLIC PANEL - Abnormal; Notable for the following components:      Result Value   Potassium 5.8 (*)    Glucose, Bld 116 (*)    BUN 40 (*)    Creatinine, Ser 2.27 (*)    Calcium 7.8 (*)    Total Protein 5.1 (*)    Albumin 1.9 (*)    Alkaline Phosphatase 131 (*)    GFR calc non Af Amer 19 (*)    GFR calc Af Amer 22 (*)    All other components within normal limits  I-STAT CHEM 8, ED - Abnormal; Notable for the following components:   Potassium 8.0 (*)    BUN 62 (*)    Creatinine, Ser 2.30 (*)    Glucose, Bld 112 (*)    Calcium, Ion 1.06 (*)    All other components within normal limits  I-STAT CHEM 8, ED - Abnormal; Notable for the following components:   BUN 38 (*)    Creatinine, Ser 2.20 (*)    Glucose, Bld 112 (*)    Calcium, Ion 1.08 (*)    TCO2 21 (*)    All other components within normal limits  LIPASE, BLOOD  CBC WITH DIFFERENTIAL/PLATELET  PROTIME-INR  CBC WITH DIFFERENTIAL/PLATELET  URINALYSIS, ROUTINE W REFLEX MICROSCOPIC  BRAIN NATRIURETIC PEPTIDE  I-STAT CG4 LACTIC ACID, ED  I-STAT TROPONIN, ED  I-STAT CG4 LACTIC ACID, ED    EKG EKG Interpretation  Date/Time:  Tuesday September 20 2017 14:25:03 EDT Ventricular Rate:  60 PR Interval:    QRS Duration: 82 QT Interval:  476 QTC  Calculation: 476 R Axis:   28 Text Interpretation:  Atrial-paced rhythm Anteroseptal infarct, old Abnormal T, consider ischemia, lateral leads no change from previous Confirmed by Charlesetta Shanks [phone]) on 09/20/2017 3:17:18 PM   Radiology Dg Chest Port 1 View  Result Date: 09/20/2017 CLINICAL DATA:  84 year old with acute mental status changes, generalized weakness and failure to thrive. EXAM: PORTABLE CHEST 1 VIEW COMPARISON:  05/06/2017, 07/25/2016 and earlier. FINDINGS: Cardiac silhouette UPPER normal in size for AP portable technique, unchanged. Thoracic aorta atherosclerotic. Hilar and mediastinal contours otherwise unremarkable. Suboptimal inspiration with atelectasis in the lung bases, LEFT greater than RIGHT. Lungs otherwise clear. Pulmonary vascularity normal. No visible pleural effusions. Severe generalized osseous demineralization. Severe degenerative changes involving the RIGHT shoulder. Vascular stent in what I suspect is the RIGHT common carotid artery. IMPRESSION: Suboptimal inspiration which accounts for bibasilar atelectasis, LEFT greater than RIGHT. No acute cardiopulmonary disease otherwise. Electronically Signed   By: Evangeline Dakin M.D.   On: 09/20/2017 14:40    Procedures Procedures (including critical care time)  Medications Ordered in ED Medications  0.9 %  sodium chloride infusion ( Intravenous New Bag/Given 09/20/17 1451)     Initial Impression / Assessment and Plan / ED Course  I have reviewed the triage vital signs and the nursing notes.  Pertinent labs & imaging results that were available during my care of the patient were reviewed by me and considered in my medical decision making (see chart for details).    Recheck: (16:00) patient is alert and pleasantly interactive.  She is answering questions.  She is in no distress.  Reports if everything checks out on her evaluation, she wishes to go back home.  She reports that under no circumstances does she want  nursing home placement.  I did contact the patient's son, Montine Circle by phone.  I had gotten much of the history from the patient's granddaughter, [name] in the emergency department.  At the time of completion of the patient's diagnostic lab work, [name] was no longer in the room or available by the phone number in the contact list.  I reviewed the history with Vicente Males and the results.  After conversation I determined to proceed with CT scan to make sure there was no intra-abdominal pathology as the main concern was one episode of vomiting and a complaint of abdominal pain which is now resolved.  If the scan returned negative and the patient is still in alert and comfortable, plan will be for probable discharge.  Montine Circle reports is no problem having somebody come pick up his mom if all checks out well. Dr. Lysbeth Galas to f/u CT.  Final Clinical Impressions(s) / ED Diagnoses   Final diagnoses:  Fatigue, unspecified type  Vomiting without nausea, intractability of vomiting not specified, unspecified vomiting type    ED Discharge Orders    None       Charlesetta Shanks, MD 09/20/17 1614

## 2017-09-20 NOTE — ED Provider Notes (Signed)
Patient care was assumed from Dr. Vallery Ridge.  Briefly, the patient is a 84 year old female who is she is here for dehydration and hyperglycemia, now cannot eat or drink.  Patient reportedly has not had anything to eat since her discharge.  She cannot eat something that vomited.  Currently, she feels nauseous and hungry, but is been unable to eat.  Imaging obtained.  She does have possible gastritis, colitis, as well as a chronic pancreatitis.  She also has a possible pancreatic mass.  She been unable to eat or drink here in the ED.  Given her worsening weakness, and inability to tolerate p.o., feel she needs ongoing treatment for her gastritis and colitis, to make sure she does not become dehydrated again.   Duffy Bruce, MD 09/20/17 [phone]

## 2017-09-20 NOTE — ED Triage Notes (Signed)
Patient arrived via [facility] from home. Family called out for patient acting different from her baseline. Patient was recently seen here for low potassium and bacterial infection. Possible UTI and ulcer on her bottom per family. Patient states she is able to wash herself but is still feeling very weak. Family states she is failure to thrive. Patient follow commands and answers questions. Patient may have had a stroke a while back and has a deficit in her right arm with limited mobility. Low PO and fluid intake while she was in the hospital recently. Hx. DM [id number]

## 2017-09-20 NOTE — ED Notes (Signed)
Dr Johnney Killian notified of patient potassium result from chem 8.(8)

## 2017-09-21 ENCOUNTER — Inpatient Hospital Stay (HOSPITAL_COMMUNITY): Payer: Medicare HMO

## 2017-09-21 ENCOUNTER — Other Ambulatory Visit: Payer: Self-pay

## 2017-09-21 ENCOUNTER — Encounter (HOSPITAL_COMMUNITY): Payer: Self-pay | Admitting: Physician Assistant

## 2017-09-21 DIAGNOSIS — R933 Abnormal findings on diagnostic imaging of other parts of digestive tract: Secondary | ICD-10-CM

## 2017-09-21 DIAGNOSIS — R609 Edema, unspecified: Secondary | ICD-10-CM

## 2017-09-21 DIAGNOSIS — E1165 Type 2 diabetes mellitus with hyperglycemia: Secondary | ICD-10-CM

## 2017-09-21 DIAGNOSIS — E1110 Type 2 diabetes mellitus with ketoacidosis without coma: Secondary | ICD-10-CM

## 2017-09-21 LAB — POCT I-STAT, CHEM 8
BUN: 62 mg/dL — AB (ref 8–23)
CALCIUM ION: 1.06 mmol/L — AB (ref 1.15–1.40)
CHLORIDE: 107 mmol/L (ref 98–111)
CREATININE: 2.3 mg/dL — AB (ref 0.44–1.00)
GLUCOSE: 112 mg/dL — AB (ref 70–99)
HCT: 42 % (ref 36.0–46.0)
Hemoglobin: 14.3 g/dL (ref 12.0–15.0)
POTASSIUM: 8 mmol/L — AB (ref 3.5–5.1)
Sodium: 135 mmol/L (ref 135–145)
TCO2: 24 mmol/L (ref 22–32)

## 2017-09-21 MED ORDER — CEFTRIAXONE SODIUM 1 G IJ SOLR
1.0000 g | INTRAMUSCULAR | Status: DC
  Filled 2017-09-21: qty 10

## 2017-09-21 MED ORDER — LIP MEDEX EX OINT
TOPICAL_OINTMENT | CUTANEOUS | Status: AC
  Administered 2017-09-21: 12:00:00
  Filled 2017-09-21: qty 7

## 2017-09-21 MED ORDER — ENSURE ENLIVE PO LIQD: 237.0000 mL | Freq: Two times a day (BID) | ORAL | Status: DC

## 2017-09-21 MED ORDER — HYDROCODONE-ACETAMINOPHEN 10-325 MG PO TABS
1.0000 | ORAL_TABLET | ORAL | Status: DC | PRN
  Administered 2017-09-21 – 2017-09-22 (×3): 1 via ORAL
  Filled 2017-09-21 (×3): qty 1

## 2017-09-21 MED ORDER — CEPHALEXIN 250 MG PO CAPS
250.0000 mg | ORAL_CAPSULE | Freq: Three times a day (TID) | ORAL | Status: DC
Start: 2017-09-21 — End: 2017-09-22
  Administered 2017-09-21 – 2017-09-22 (×3): 250 mg via ORAL
  Filled 2017-09-21 (×4): qty 1

## 2017-09-21 MED ORDER — FAMOTIDINE 20 MG PO TABS
20.0000 mg | ORAL_TABLET | Freq: Every day | ORAL | Status: DC
  Administered 2017-09-21 – 2017-09-22 (×2): 20 mg via ORAL
  Filled 2017-09-21 (×2): qty 1

## 2017-09-21 MED ORDER — GI COCKTAIL ~~LOC~~
30.0000 mL | Freq: Once | ORAL | Status: AC
  Administered 2017-09-21: 30 mL via ORAL
  Filled 2017-09-21: qty 30

## 2017-09-21 NOTE — Progress Notes (Signed)
Spoke to primary RN regarding midline placement. Made aware that patient has left upper arm swelling per MD note as well as CVA with right sided weakness. Recommendations would be for patient to go to IR for line placement. Will follow up.

## 2017-09-21 NOTE — Consult Note (Signed)
Reason for Consult: Vascular access.  Referring Physician: [place]  Carmen [name] is an 84 y.o. female.  HPI: Originally admitted for hyperosmolar hyperglycemic state 10 days ago and readmitted yesterday for nausea and vomiting. CT shows possible pancreatic mass.   At present the patient has very poor vascular access and has developed L arm edema due to repeated phlebotomy. Patient has known gastroparesis related to poorly controlled diabetes.  Past Medical History:  Diagnosis Date  . Anxiety   . Arthritis    "everywhere"  . Cataract    "both eyes; blood pressure's always too high to have them fixed" (09/11/2014)  . Chronic lower back pain   . Claudication in peripheral vascular disease ([place])    02/16/10: Left CIA 9.0x28 Omnilink and REIA 8.0x40 seff expanding Zilver.   right subclavian artery stent 03/18/2008,  . Coronary artery disease   . Herniated lumbar intervertebral disc   . Hypertension   . IDDM (insulin dependent diabetes mellitus) ([place])   . Migraine    "used to have terrible migraines; stopped in the 1990's"  . Pacemaker   . Peripheral vascular disease ([place])   . Pneumonia X 2  . Renal disorder   . Renovascular hypertension     s/p left renal artery stent 12/2007.  S/P balloon angioplasty on 02/16/10 for ISR, BP was controlled well since then.     . Stroke [facility]) 1999   Stroke and TIA in 1999 with right sided weakness, now with residual right arm weakness (09/11/2014)  . UTI (lower urinary tract infection) 02/2015    Past Surgical History:  Procedure Laterality Date  . ABDOMINAL ANGIOGRAM N/A 07/06/2011   Procedure: ABDOMINAL ANGIOGRAM;  Surgeon: Laverda Page, MD;  Location: [facility] CATH LAB;  Service: Cardiovascular;  Laterality: N/A;  . APPENDECTOMY    . BACK SURGERY    . CAROTID ENDARTERECTOMY Left 1991    Stroke and TIA in 1999 with right sided weakness, now with residual right arm weakness  . CARPAL TUNNEL RELEASE Left   . COLONOSCOPY  07/30/2011   Procedure:  COLONOSCOPY;  Surgeon: Inda Castle, MD;  Location: [place];  Service: Endoscopy;  Laterality: N/A;  gi bleed  . ESOPHAGOGASTRODUODENOSCOPY  07/30/2011   Procedure: ESOPHAGOGASTRODUODENOSCOPY (EGD);  Surgeon: Inda Castle, MD;  Location: [place];  Service: Endoscopy;  Laterality: N/A;  . EXCISIONAL HEMORRHOIDECTOMY    . INSERT / REPLACE / REMOVE PACEMAKER    . IR FLUORO GUIDE CV LINE RIGHT  07/26/2016  . IR FLUORO GUIDE CV LINE RIGHT  09/16/2017  . IR US GUIDE VASC ACCESS RIGHT  07/26/2016  . IR US GUIDE VASC ACCESS RIGHT  09/16/2017  . LOWER EXTREMITY ANGIOGRAM Right 05/29/2012   Procedure: LOWER EXTREMITY ANGIOGRAM;  Surgeon: Laverda Page, MD;  Location: [facility] CATH LAB;  Service: Cardiovascular;  Laterality: Right;  . LUMBAR LAMINECTOMY     'herniated disc"  . PERIPHERAL VASCULAR CATHETERIZATION N/A 11/05/2014   Procedure: Abdominal Aortogram;  Surgeon: Serafina Mitchell, MD;  Location: [place] CV LAB;  Service: Cardiovascular;  Laterality: N/A;  . PERMANENT PACEMAKER INSERTION Left 06/28/2011   Procedure: PERMANENT PACEMAKER INSERTION;  Surgeon: Deboraha Sprang, MD;  Location: [facility] CATH LAB;  Service: Cardiovascular;  Laterality: Left;  . RENAL ANGIOGRAM N/A 07/06/2011   Procedure: RENAL ANGIOGRAM;  Surgeon: Laverda Page, MD;  Location: [facility] CATH LAB;  Service: Cardiovascular;  Laterality: N/A;  . TONSILLECTOMY    . URETERAL STENT PLACEMENT    .  VAGINAL HYSTERECTOMY      Family History  Problem Relation Age of Onset  . Cancer Father        Colon cancer  . Heart attack Mother     Social History:  reports that she has quit smoking. Her smoking use included cigarettes. She smoked 0.00 packs per day for 70.00 years. She has never used smokeless tobacco. She reports that she does not drink alcohol or use drugs.  Allergies:  Allergies  Allergen Reactions  . Norvasc [Amlodipine Besylate] Swelling  . Peanut-Containing Drug Products Other (See Comments)    Stomach pain     Medications: I have reviewed the patient's current medications.  Results for orders placed or performed during the hospital encounter of 09/20/17 (from the past 48 hour(s))  Comprehensive metabolic panel     Status: Abnormal   Collection Time: 09/20/17  2:44 PM  Result Value Ref Range   Sodium 140 135 - 145 mmol/L   Potassium 5.8 (H) 3.5 - 5.1 mmol/L    Comment: DELTA CHECK NOTED NO VISIBLE HEMOLYSIS REPEATED TO VERIFY    Chloride 108 98 - 111 mmol/L    Comment: Please note change in reference range.   CO2 23 22 - 32 mmol/L   Glucose, Bld 116 (H) 70 - 99 mg/dL    Comment: Please note change in reference range.   BUN 40 (H) 8 - 23 mg/dL    Comment: Please note change in reference range.   Creatinine, Ser 2.27 (H) 0.44 - 1.00 mg/dL   Calcium 7.8 (L) 8.9 - 10.3 mg/dL   Total Protein 5.1 (L) 6.5 - 8.1 g/dL   Albumin 1.9 (L) 3.5 - 5.0 g/dL   AST 35 15 - 41 U/L   ALT 24 0 - 44 U/L    Comment: Please note change in reference range.   Alkaline Phosphatase 131 (H) 38 - 126 U/L   Total Bilirubin 0.6 0.3 - 1.2 mg/dL   GFR calc non Af Amer 19 (L) >60 mL/min   GFR calc Af Amer 22 (L) >60 mL/min    Comment: (NOTE) The eGFR has been calculated using the CKD EPI equation. This calculation has not been validated in all clinical situations. eGFR's persistently <60 mL/min signify possible Chronic Kidney Disease.    Anion gap 9 5 - 15    Comment: Performed at [facility], [place] [address], [place], [place] [address]  Lipase, blood     Status: None   Collection Time: 09/20/17  2:44 PM  Result Value Ref Range   Lipase 15 11 - 51 U/L    Comment: Performed at [facility], [place] [address] [place], [place] [address]  I-stat troponin, ED     Status: None   Collection Time: 09/20/17  2:47 PM  Result Value Ref Range   Troponin i, poc 0.01 0.00 - 0.08 ng/mL   Comment 3            Comment: Due to the release kinetics of cTnI, a negative result within the  first hours of the onset of symptoms does not rule out myocardial infarction with certainty. If myocardial infarction is still suspected, repeat the test at appropriate intervals.   I-stat Chem 8, ED     Status: Abnormal   Collection Time: 09/20/17  2:49 PM  Result Value Ref Range   Sodium 135 135 - 145 mmol/L   Potassium 8.0 (HH) 3.5 - 5.1 mmol/L   Chloride 107 98 -  111 mmol/L   BUN 62 (H) 8 - 23 mg/dL   Creatinine, Ser 2.30 (H) 0.44 - 1.00 mg/dL   Glucose, Bld 112 (H) 70 - 99 mg/dL   Calcium, Ion 1.06 (L) 1.15 - 1.40 mmol/L   TCO2 24 22 - 32 mmol/L   Hemoglobin 14.3 12.0 - 15.0 g/dL   HCT 42.0 36.0 - 46.0 %   Comment NOTIFIED PHYSICIAN   I-Stat CG4 Lactic Acid, ED     Status: None   Collection Time: 09/20/17  2:49 PM  Result Value Ref Range   Lactic Acid, Venous 0.95 0.5 - 1.9 mmol/L  I-STAT, chem 8     Status: Abnormal   Collection Time: 09/20/17  2:49 PM  Result Value Ref Range   Sodium 135 135 - 145 mmol/L   Potassium 8.0 (HH) 3.5 - 5.1 mmol/L    Comment: QUESTIONABLE RESULTS - CHARGE CREDITED Performed at [facility] [address] [place], [place] [address]    Chloride 107 98 - 111 mmol/L   BUN 62 (H) 8 - 23 mg/dL   Creatinine, Ser 2.30 (H) 0.44 - 1.00 mg/dL   Glucose, Bld 112 (H) 70 - 99 mg/dL   Calcium, Ion 1.06 (L) 1.15 - 1.40 mmol/L   TCO2 24 22 - 32 mmol/L   Hemoglobin 14.3 12.0 - 15.0 g/dL   HCT 42.0 36.0 - 46.0 %   Comment NOTIFIED PHYSICIAN   CBC with Differential/Platelet     Status: None   Collection Time: 09/20/17  3:25 PM  Result Value Ref Range   WBC 4.9 4.0 - 10.5 K/uL   RBC 3.99 3.87 - 5.11 MIL/uL   Hemoglobin 12.5 12.0 - 15.0 g/dL   HCT 36.8 36.0 - 46.0 %   MCV 92.2 78.0 - 100.0 fL   MCH 31.3 26.0 - 34.0 pg   MCHC 34.0 30.0 - 36.0 g/dL   RDW 13.4 11.5 - 15.5 %   Platelets 168 150 - 400 K/uL   Neutrophils Relative % 73 %   Neutro Abs 3.6 1.7 - 7.7 K/uL   Lymphocytes Relative 23 %   Lymphs Abs 1.1 0.7 - 4.0 K/uL   Monocytes  Relative 3 %   Monocytes Absolute 0.2 0.1 - 1.0 K/uL   Eosinophils Relative 1 %   Eosinophils Absolute 0.0 0.0 - 0.7 K/uL   Basophils Relative 0 %   Basophils Absolute 0.0 0.0 - 0.1 K/uL    Comment: Performed at [facility], [place] [address] [place], [place] [address]  Brain natriuretic peptide     Status: Abnormal   Collection Time: 09/20/17  3:25 PM  Result Value Ref Range   B Natriuretic Peptide 643.0 (H) 0.0 - 100.0 pg/mL    Comment: Performed at [facility], [place] [address], [place], [place] [address]  Protime-INR     Status: None   Collection Time: 09/20/17  3:25 PM  Result Value Ref Range   Prothrombin Time 13.6 11.4 - 15.2 seconds   INR 1.05     Comment: Performed at [facility], [place] [address] [place], [place] [address]  I-Stat Chem 8, ED     Status: Abnormal   Collection Time: 09/20/17  3:32 PM  Result Value Ref Range   Sodium 138 135 - 145 mmol/L   Potassium 4.9 3.5 - 5.1 mmol/L   Chloride 108 98 - 111 mmol/L   BUN 38 (H) 8 - 23 mg/dL   Creatinine, Ser 2.20 (  H) 0.44 - 1.00 mg/dL   Glucose, Bld 112 (H) 70 - 99 mg/dL   Calcium, Ion 1.08 (L) 1.15 - 1.40 mmol/L   TCO2 21 (L) 22 - 32 mmol/L   Hemoglobin 12.9 12.0 - 15.0 g/dL   HCT 38.0 36.0 - 46.0 %  Urinalysis, Routine w reflex microscopic     Status: Abnormal   Collection Time: 09/20/17  7:33 PM  Result Value Ref Range   Color, Urine YELLOW YELLOW   APPearance HAZY (A) CLEAR   Specific Gravity, Urine 1.015 1.005 - 1.030   pH 5.0 5.0 - 8.0   Glucose, UA NEGATIVE NEGATIVE mg/dL   Hgb urine dipstick SMALL (A) NEGATIVE   Bilirubin Urine NEGATIVE NEGATIVE   Ketones, ur 5 (A) NEGATIVE mg/dL   Protein, ur 100 (A) NEGATIVE mg/dL   Nitrite NEGATIVE NEGATIVE   Leukocytes, UA SMALL (A) NEGATIVE   RBC / HPF 0-5 0 - 5 RBC/hpf   WBC, UA 11-20 0 - 5 WBC/hpf   Bacteria, UA RARE (A) NONE SEEN   Squamous Epithelial / LPF 0-5 0 - 5   Mucus PRESENT     Comment: Performed  at [facility], [place] [address], [place], [place] [address]    Ct Abdomen Pelvis Wo Contrast  Result Date: 09/20/2017 CLINICAL DATA:  84 year old female recently treated for low potassium and bacterial infection. Possible UTI. Failure to thrive. Initial encounter. EXAM: CT ABDOMEN AND PELVIS WITHOUT CONTRAST TECHNIQUE: Multidetector CT imaging of the abdomen and pelvis was performed following the standard protocol without IV contrast. COMPARISON:  01/14/2015 CT. FINDINGS: Lower chest: Basilar atelectasis with small pleural effusions. Subtle basilar infiltrates secondary consideration. Heart size within normal limits. Biventricular pacer in place. Tip is in the region of the right atrium and right ventricle. Three vessel coronary artery calcification. Hepatobiliary: Taking into account limitation by non contrast imaging, no worrisome hepatic lesion. No calcified gallstones. Pancreas: Evidence of prior chronic pancreatitis with calcifications throughout the pancreas. Additionally, in the pancreatic tail region extending into the lesser sac and anterior to the splenic artery is a 5.2 x 2.8 x 3.3 cm abnormality (series 2, image 19 and series 6, image 71). This may represent pancreatic mass or abscess. Spleen: Tiny splenic calcification.  No splenomegaly. Adrenals/Urinary Tract: No hydronephrosis or obstructing stone. Right renal cyst. Taking into account limitation by non contrast imaging, no worrisome renal or adrenal abnormality. Noncontrast filled urinary bladder with tiny amount a gas which may be related to recent manipulation. Clinical correlation recommended. Small cystocele. Mild circumferential wall thickening. Stomach/Bowel: Long segment of colon is under distended with mild circumferential wall thickening. Third spacing of fluid limits evaluation for the detection of extraluminal bowel inflammatory process. In the proper clinical setting, colitis is a consideration. Stomach under  distended and evaluation limited. Decompressed gastric walls appear slightly thickened. Gastritis not excluded. Limited evaluation of small bowel secondary to lack of fat planes. Vascular/Lymphatic: Prominent aortic calcification without aneurysm. Prominent aortic branch vessel atherosclerotic changes and iliac artery/femoral artery atherosclerotic changes with probable areas of moderate to marked narrowing. Scattered small lymph nodes. Reproductive: No worrisome adnexal mass. Other: No free air pneumatosis or portal venous gas. No bowel containing hernia. Third spacing of fluid. Musculoskeletal: Fusion L5-S1. Chronic compression deformity L1-L2 and L4 without change. Scoliosis and degenerative changes. Hip joint degenerative changes. IMPRESSION: Third spacing of fluid, lack of fat planes and lack of contrast limited evaluation. Evidence of chronic pancreatitis. Additionally, in the pancreatic tail region extending into the  lesser sac and anterior to the splenic artery is a 5.2 x 2.8 x 3.3 cm abnormality (series 2, image 19 and series 6, image 71). This may represent a pancreatic mass or abscess. Diffuse circumferential colonic wall thickening may be related to under distension and third spacing of fluid although colitis not excluded in proper clinical setting. Under distended stomach with gastric wall thickening and therefore gastritis not excluded in proper clinical setting. Aortic Atherosclerosis (ICD10-I70.0). Prominent aortic branch vessel atherosclerotic changes and iliac artery/femoral artery atherosclerotic changes with probable areas of moderate to marked narrowing. Bibasilar atelectasis with small pleural effusions. Subtle basilar infiltrates is a secondary consideration. Electronically Signed   By: Genia Del M.D.   On: 09/20/2017 18:51   Dg Chest Port 1 View  Result Date: 09/20/2017 CLINICAL DATA:  84 year old with acute mental status changes, generalized weakness and failure to thrive. EXAM:  PORTABLE CHEST 1 VIEW COMPARISON:  05/06/2017, 07/25/2016 and earlier. FINDINGS: Cardiac silhouette UPPER normal in size for AP portable technique, unchanged. Thoracic aorta atherosclerotic. Hilar and mediastinal contours otherwise unremarkable. Suboptimal inspiration with atelectasis in the lung bases, LEFT greater than RIGHT. Lungs otherwise clear. Pulmonary vascularity normal. No visible pleural effusions. Severe generalized osseous demineralization. Severe degenerative changes involving the RIGHT shoulder. Vascular stent in what I suspect is the RIGHT common carotid artery. IMPRESSION: Suboptimal inspiration which accounts for bibasilar atelectasis, LEFT greater than RIGHT. No acute cardiopulmonary disease otherwise. Electronically Signed   By: Evangeline Dakin M.D.   On: 09/20/2017 14:40   [place] Ekg Site Rite  Result Date: 09/20/2017 If Site Rite image not attached, placement could not be confirmed due to current cardiac rhythm.   Review of Systems  Unable to perform ROS: Dementia   Blood pressure (!) 166/60, pulse 69, temperature 98.3 F (36.8 C), temperature source Oral, resp. rate 12, height _0  (1.575 m), weight 114 lb 3.2 oz (51.8 kg), SpO2 97 %. Physical Exam  Constitutional:  Malnourished  HENT:  Dry mucous membranes.  Eyes: Conjunctivae are normal.  Neck: Neck supple. No JVD present.  Cardiovascular: Normal rate and normal heart sounds.  Respiratory: Breath sounds normal.  GI: Soft.  Neurological:  Non-focal but confused.  Skin: Skin is dry. Rash (Generalized xerodermia.) noted.    Assessment/Plan:  Very frail patient with advanced vasculopathy and diabetes with diabetic gastroparesis which is not correctable. Currently the patient is not receiving any intravenous therapies and the line would be a difficult placement given prior carotid surgery and the patient's habitus.  She is confused and risks dislodging the central line.    In an attempt to correct her acute illness we  may very well place her at risk of an iatrogenic complication even though our current interventions will not ultimately change the trajectory of her disease: she will still have gastroparesis and have recurrent vomiting.   Given her frailty, she may be better served with a comfort oriented approach for which the granddaughter present was in agreement.  Kipp Brood, MD [facility] ICU Physician [place]  Pager: [phone] Mobile: [phone] After hours: [phone].   Carmen [name] 09/21/2017, 3:21 PM

## 2017-09-21 NOTE — Progress Notes (Signed)
PHARMACY NOTE -  RENAL DOSE ADJUSTMENT   CrCl 16 ml/min.  Plan: pepcid 20 IV q12 change to pepcid 20 PO qday for renal fxn in pt taking PO meds  Eudelia Bunch, Pharm.D 09/21/2017 8:33 AM

## 2017-09-21 NOTE — Progress Notes (Signed)
This RN to bedside to attempt to assess for PIV insertion using ultrasound. Family adamantly refused IV insertion into either arm. Elgergawy MD at bedside to attempt right EJ insertion, which was unsuccessful.  Order to be placed for IJ insertion.

## 2017-09-21 NOTE — Progress Notes (Signed)
PROGRESS NOTE                                                                                                                                                                                                             Patient Demographics:    Carmen [name], is a 84 y.o. female, DOB - June 08, 1933, GBT:[id number]  Admit date - 09/20/2017   Admitting Physician [name], MD  Outpatient Primary MD for the patient is Glendale Chard, MD  LOS - 1   Chief Complaint  Patient presents with  . Fatigue       Brief Narrative   84 y.o. female with history of diabetes mellitus type 2 last hemoglobin A1c last week was 16, sick sinus syndrome status post pacemaker placement, hypertension, chronic kidney disease stage IV, peripheral arterial disease who was admitted last week for severe hypokalemia and hyperosmolar status secondary to uncontrolled diabetes was discharged 2 days ago to home and was brought back today because of persistent nausea vomiting since morning.  Has been doing some abdominal discomfort denies any chest pain or shortness of breath.  Denies any diarrhea.  ED Course: In the ER patient's lab work comparable to the recent discharge labs.  CT abdomen and pelvis done shows which is concerning for chronic pancreatitis with possible pancreatic mass versus abscess.  Patient admitted for further management of persistent nausea vomiting and abnormal CAT scan finding.     Subjective:    Carmen [name] today has, No headache, No chest pain, No abdominal pain -no nausea or vomiting since admission .   Assessment  & Plan :    Principal Problem:   Nausea & vomiting Active Problems:   History of CVA (cerebrovascular accident) with associated mild right upper extremity hemiplegia   CKD (chronic kidney disease), stage III (HCC)   HTN (hypertension)   Failure to thrive in adult   Uncontrolled type 2 diabetes mellitus with hyperglycemia (HCC)  Pancreatic mass  Nausea and vomiting -Appears to be and has resolved, continue with clear liquid diet for now, significant for chronic pancreatitis, which is known in the past, she denies any abdominal pain. -Overall poorly controlled diabetes mellitus, but unlikely gastroparesis, as per daughter this is acute in onset not recurrent.  Pancreatic mass -GI consulted  Diabetes mellitus type 2 -  recent admission for hyperosmolar status  with hemoglobin A1c was around 16.  Patient is on NPH 5 units twice daily.  -Continue with insulin sliding scale  Hypertension - on hydralazine Imdur and metoprolol.  Chronic kidney disease stage IV  - with recent worsening which is improved.  Follow metabolic panel.  History of sick sinus syndrome  - status post pacemaker placement.  History of peripheral vascular disease  - on antiplatelet agents.  History of CVA with right-sided weakness.  Possible UTI -On Rocephin, follow with urine cultures     Code Status : Full code  Family Communication  : Daughter and granddaughter at bedside  Disposition Plan  : Home  Consults  :  GI, PCCM  Procedures  : none  DVT Prophylaxis  :  SCD  Lab Results  Component Value Date   PLT 168 09/20/2017    Antibiotics  :   Anti-infectives (From admission, onward)   None        Objective:   Vitals:   09/20/17 2311 09/20/17 2329 09/21/17 0500 09/21/17 0514  BP: (!) 167/71 (!) 192/74  (!) 166/60  Pulse:  63  69  Resp:  12  12  Temp:  97.8 F (36.6 C)  98.3 F (36.8 C)  TempSrc:  Oral  Oral  SpO2:  98%  97%  Weight:   51.8 kg (114 lb 3.2 oz)   Height:        Wt Readings from Last 3 Encounters:  09/21/17 51.8 kg (114 lb 3.2 oz)  09/18/17 48.2 kg (106 lb 4.2 oz)  05/08/17 40.8 kg (89 lb 15.2 oz)     Intake/Output Summary (Last 24 hours) at 09/21/2017 1646 Last data filed at 09/21/2017 0200 Gross per 24 hour  Intake 120 ml  Output -  Net 120 ml     Physical Exam  Frail elderly  female, laying in bed in no apparent distress Symmetrical Chest wall movement, Good air movement bilaterally, CTAB RRR,No Gallops,Rubs or new Murmurs, No Parasternal Heave +ve B.Sounds, Abd Soft, No tenderness,No rebound - guarding or rigidity. No Cyanosis, Clubbing or edema, No new Rash or bruise      Data Review:    CBC Recent Labs  Lab 09/16/17 0113 09/18/17 0552 09/19/17 0409 09/20/17 1449 09/20/17 1525 09/20/17 1532  WBC 5.7 5.6 6.2  --  4.9  --   HGB 13.7 12.2 12.3 14.3  14.3 12.5 12.9  HCT 38.1 34.6* 36.3 42.0  42.0 36.8 38.0  PLT 183 155 161  --  168  --   MCV 89.2 90.3 91.9  --  92.2  --   MCH 32.1 31.9 31.1  --  31.3  --   MCHC 36.0 35.3 33.9  --  34.0  --   RDW 12.7 13.1 13.3  --  13.4  --   LYMPHSABS 0.9  --   --   --  1.1  --   MONOABS 0.3  --   --   --  0.2  --   EOSABS 0.0  --   --   --  0.0  --   BASOSABS 0.0  --   --   --  0.0  --     Chemistries  Recent Labs  Lab 09/16/17 0636  09/17/17 0241 09/17/17 0530 09/17/17 0535 09/18/17 0552 09/19/17 0409 09/20/17 1444 09/20/17 1449 09/20/17 1532  NA 133*   < > 141  --  140 139 140 140 135  135 138  K 3.6   < > 4.1  --  3.5 4.0 4.2 5.8* 8.0*  8.0* 4.9  CL 88*   < > 103  --  103 107 109 108 107  107 108  CO2 29   < > 29  --  30 26 25 23   --   --   GLUCOSE 504*   < > 67*  --  80 92 68* 116* 112*  112* 112*  BUN 45*   < > 41*  --  39* 37* 39* 40* 62*  62* 38*  CREATININE 3.14*   < > 2.54*  --  2.40* 2.37* 2.24* 2.27* 2.30*  2.30* 2.20*  CALCIUM 7.2*   < > 7.2*  --  7.3* 7.7* 7.7* 7.8*  --   --   MG 1.7  --   --  1.8  --  2.8*  --   --   --   --   AST 17  --   --   --   --   --   --  35  --   --   ALT 13  --   --   --   --   --   --  24  --   --   ALKPHOS 107  --   --   --   --   --   --  131*  --   --   BILITOT 0.4  --   --   --   --   --   --  0.6  --   --    < > = values in this interval not displayed.    ------------------------------------------------------------------------------------------------------------------ No results for input(s): CHOL, HDL, LDLCALC, TRIG, CHOLHDL, LDLDIRECT in the last 72 hours.  Lab Results  Component Value Date   HGBA1C 16.2 (H) 09/17/2017   ------------------------------------------------------------------------------------------------------------------ No results for input(s): TSH, T4TOTAL, T3FREE, THYROIDAB in the last 72 hours.  Invalid input(s): FREET3 ------------------------------------------------------------------------------------------------------------------ No results for input(s): VITAMINB12, FOLATE, FERRITIN, TIBC, IRON, RETICCTPCT in the last 72 hours.  Coagulation profile Recent Labs  Lab 09/20/17 1525  INR 1.05    No results for input(s): DDIMER in the last 72 hours.  Cardiac Enzymes Recent Labs  Lab 09/16/17 0856 09/16/17 1659 09/16/17 2129  TROPONINI 0.08* 0.07* 0.06*   ------------------------------------------------------------------------------------------------------------------    Component Value Date/Time   BNP 643.0 (H) 09/20/2017 1525    Inpatient Medications  Scheduled Meds: . aspirin EC  81 mg Oral Daily  . famotidine  20 mg Oral Daily  . feeding supplement (GLUCERNA SHAKE)  237 mL Oral TID BM  . hydrALAZINE  10 mg Oral TID  . insulin aspart protamine- aspart  5 Units Subcutaneous BID WC  . isosorbide mononitrate  120 mg Oral Daily  . metoprolol tartrate  25 mg Oral BID  . omega-3 acid ethyl esters  1 g Oral Daily   Continuous Infusions: PRN Meds:.acetaminophen **OR** acetaminophen, bisacodyl, HYDROcodone-acetaminophen, ondansetron **OR** ondansetron (ZOFRAN) IV  Micro Results Recent Results (from the past 240 hour(s))  MRSA PCR Screening     Status: None   Collection Time: 09/16/17 11:13 AM  Result Value Ref Range Status   MRSA by PCR NEGATIVE NEGATIVE Final    Comment:        The GeneXpert  MRSA Assay (FDA approved for NASAL specimens only), is one component of a comprehensive MRSA colonization surveillance program. It is not intended to diagnose MRSA infection nor to guide or monitor treatment for MRSA infections. Performed at [facility], [place]  [address], [place], [place] [address]   Culture, Urine     Status: None   Collection Time: 09/16/17 12:19 PM  Result Value Ref Range Status   Specimen Description   Final    URINE, CATHETERIZED Performed at [place] [address] [place], [place] [address]    Special Requests   Final    NONE Performed at [facility], [place] [address], [place], [place] [address]    Culture   Final    NO GROWTH Performed at [facility], [place] [address] [place],  [address]    Report Status 09/17/2017 FINAL  Final    Radiology Reports Ct Abdomen Pelvis Wo Contrast  Result Date: 09/20/2017 CLINICAL DATA:  84 year old female recently treated for low potassium and bacterial infection. Possible UTI. Failure to thrive. Initial encounter. EXAM: CT ABDOMEN AND PELVIS WITHOUT CONTRAST TECHNIQUE: Multidetector CT imaging of the abdomen and pelvis was performed following the standard protocol without IV contrast. COMPARISON:  01/14/2015 CT. FINDINGS: Lower chest: Basilar atelectasis with small pleural effusions. Subtle basilar infiltrates secondary consideration. Heart size within normal limits. Biventricular pacer in place. Tip is in the region of the right atrium and right ventricle. Three vessel coronary artery calcification. Hepatobiliary: Taking into account limitation by non contrast imaging, no worrisome hepatic lesion. No calcified gallstones. Pancreas: Evidence of prior chronic pancreatitis with calcifications throughout the pancreas. Additionally, in the pancreatic tail region extending into the lesser sac and anterior to the splenic artery is a 5.2 x 2.8 x 3.3 cm  abnormality (series 2, image 19 and series 6, image 71). This may represent pancreatic mass or abscess. Spleen: Tiny splenic calcification.  No splenomegaly. Adrenals/Urinary Tract: No hydronephrosis or obstructing stone. Right renal cyst. Taking into account limitation by non contrast imaging, no worrisome renal or adrenal abnormality. Noncontrast filled urinary bladder with tiny amount a gas which may be related to recent manipulation. Clinical correlation recommended. Small cystocele. Mild circumferential wall thickening. Stomach/Bowel: Long segment of colon is under distended with mild circumferential wall thickening. Third spacing of fluid limits evaluation for the detection of extraluminal bowel inflammatory process. In the proper clinical setting, colitis is a consideration. Stomach under distended and evaluation limited. Decompressed gastric walls appear slightly thickened. Gastritis not excluded. Limited evaluation of small bowel secondary to lack of fat planes. Vascular/Lymphatic: Prominent aortic calcification without aneurysm. Prominent aortic branch vessel atherosclerotic changes and iliac artery/femoral artery atherosclerotic changes with probable areas of moderate to marked narrowing. Scattered small lymph nodes. Reproductive: No worrisome adnexal mass. Other: No free air pneumatosis or portal venous gas. No bowel containing hernia. Third spacing of fluid. Musculoskeletal: Fusion L5-S1. Chronic compression deformity L1-L2 and L4 without change. Scoliosis and degenerative changes. Hip joint degenerative changes. IMPRESSION: Third spacing of fluid, lack of fat planes and lack of contrast limited evaluation. Evidence of chronic pancreatitis. Additionally, in the pancreatic tail region extending into the lesser sac and anterior to the splenic artery is a 5.2 x 2.8 x 3.3 cm abnormality (series 2, image 19 and series 6, image 71). This may represent a pancreatic mass or abscess. Diffuse circumferential  colonic wall thickening may be related to under distension and third spacing of fluid although colitis not excluded in proper clinical setting. Under distended stomach with gastric wall thickening and therefore gastritis not excluded in proper clinical setting. Aortic Atherosclerosis (ICD10-I70.0). Prominent aortic branch vessel atherosclerotic changes and iliac artery/femoral artery atherosclerotic changes with probable areas of moderate to marked narrowing.  Bibasilar atelectasis with small pleural effusions. Subtle basilar infiltrates is a secondary consideration. Electronically Signed   By: Genia Del M.D.   On: 09/20/2017 18:51   US Renal  Result Date: 09/17/2017 CLINICAL DATA:  Acute on chronic renal failure and history of diabetes. History of chronic kidney disease, stage IV. EXAM: RENAL / URINARY TRACT ULTRASOUND COMPLETE COMPARISON:  01/15/2016 FINDINGS: Right Kidney: Length: 9.2 cm. Stable echogenic appearance with small 1.6 cm upper pole simple cyst. No hydronephrosis. Left Kidney: Length: 9.7 cm.  Stable echogenic appearance without hydronephrosis. Bladder: The bladder is completely decompressed by a Foley catheter. IMPRESSION: The kidneys are stable in size and shows stable echogenic appearance consistent with underlying chronic kidney disease. No evidence of renal obstruction. Electronically Signed   By: Aletta Edouard M.D.   On: 09/17/2017 10:35   Ir Fluoro Guide Cv Line Right  Result Date: 09/16/2017 INDICATION: Diabetic ketoacidosis, limited access EXAM: ULTRASOUND AND FLUOROSCOPIC RIGHT IJ TRIPLE-LUMEN PICC LINE MEDICATIONS: 1% lidocaine local ANESTHESIA/SEDATION: Moderate Sedation Time: None. The patient's level of consciousness and vital signs were monitored continuously by radiology nursing throughout the procedure under my direct supervision. FLUOROSCOPY TIME:  Fluoroscopy Time: 12 seconds (1 mGy). COMPLICATIONS: None immediate. PROCEDURE: Informed written consent was obtained from  the patient's power of attorney after a thorough discussion of the procedural risks, benefits and alternatives. All questions were addressed. Maximal Sterile Barrier Technique was utilized including caps, mask, sterile gowns, sterile gloves, sterile drape, hand hygiene and skin antiseptic. A timeout was performed prior to the initiation of the procedure. Under sterile conditions and local anesthesia, ultrasound micropuncture access performed of the right internal jugular vein. Images obtained for documentation of the patent right internal jugular vein. Guidewire advanced easily followed by the peel-away sheath. Measurements obtained for the appropriate length. Over the guidewire, a 15 cm 6 French triple-lumen PICC line was advanced with the tip position at the SVC RA junction. Position confirmed with fluoroscopy. Images obtained for documentation. Blood aspirated easily followed by saline heparin flushes. Appropriate volume and strength of heparin instilled in all lumens followed by external caps. Catheter secured with Ethilon sutures and a sterile dressing. No immediate complication. Patient tolerated the procedure well. IMPRESSION: Successful ultrasound and fluoroscopic right IJ triple-lumen 6 French PICC line. Tip SVC RA junction. Ready for use. Electronically Signed   By: Jerilynn Mages.  Shick M.D.   On: 09/16/2017 10:55   Ir US Guide Vasc Access Right  Result Date: 09/16/2017 INDICATION: Diabetic ketoacidosis, limited access EXAM: ULTRASOUND AND FLUOROSCOPIC RIGHT IJ TRIPLE-LUMEN PICC LINE MEDICATIONS: 1% lidocaine local ANESTHESIA/SEDATION: Moderate Sedation Time: None. The patient's level of consciousness and vital signs were monitored continuously by radiology nursing throughout the procedure under my direct supervision. FLUOROSCOPY TIME:  Fluoroscopy Time: 12 seconds (1 mGy). COMPLICATIONS: None immediate. PROCEDURE: Informed written consent was obtained from the patient's power of attorney after a thorough  discussion of the procedural risks, benefits and alternatives. All questions were addressed. Maximal Sterile Barrier Technique was utilized including caps, mask, sterile gowns, sterile gloves, sterile drape, hand hygiene and skin antiseptic. A timeout was performed prior to the initiation of the procedure. Under sterile conditions and local anesthesia, ultrasound micropuncture access performed of the right internal jugular vein. Images obtained for documentation of the patent right internal jugular vein. Guidewire advanced easily followed by the peel-away sheath. Measurements obtained for the appropriate length. Over the guidewire, a 15 cm 6 French triple-lumen PICC line was advanced with the tip position at the [facility] RA  junction. Position confirmed with fluoroscopy. Images obtained for documentation. Blood aspirated easily followed by saline heparin flushes. Appropriate volume and strength of heparin instilled in all lumens followed by external caps. Catheter secured with Ethilon sutures and a sterile dressing. No immediate complication. Patient tolerated the procedure well. IMPRESSION: Successful ultrasound and fluoroscopic right IJ triple-lumen 6 French PICC line. Tip SVC RA junction. Ready for use. Electronically Signed   By: Jerilynn Mages.  Shick M.D.   On: 09/16/2017 10:55   Dg Chest Port 1 View  Result Date: 09/20/2017 CLINICAL DATA:  84 year old with acute mental status changes, generalized weakness and failure to thrive. EXAM: PORTABLE CHEST 1 VIEW COMPARISON:  05/06/2017, 07/25/2016 and earlier. FINDINGS: Cardiac silhouette UPPER normal in size for AP portable technique, unchanged. Thoracic aorta atherosclerotic. Hilar and mediastinal contours otherwise unremarkable. Suboptimal inspiration with atelectasis in the lung bases, LEFT greater than RIGHT. Lungs otherwise clear. Pulmonary vascularity normal. No visible pleural effusions. Severe generalized osseous demineralization. Severe degenerative changes involving the  RIGHT shoulder. Vascular stent in what I suspect is the RIGHT common carotid artery. IMPRESSION: Suboptimal inspiration which accounts for bibasilar atelectasis, LEFT greater than RIGHT. No acute cardiopulmonary disease otherwise. Electronically Signed   By: Evangeline Dakin M.D.   On: 09/20/2017 14:40   [place] Ekg Site Rite  Result Date: 09/20/2017 If Site Rite image not attached, placement could not be confirmed due to current cardiac rhythm.    Phillips Climes M.D on 09/21/2017 at 4:46 PM  Between 7am to 7pm - Pager - [phone]  After 7pm go to [email or web address] - password [facility]  [facility] -  Office  [phone]

## 2017-09-21 NOTE — Progress Notes (Signed)
Initial Nutrition Assessment  DOCUMENTATION CODES:   Severe malnutrition in context of chronic illness  INTERVENTION:   D/c Ensure supplements Continue Glucerna Shake po TID, each supplement provides 220 kcal and 10 grams of protein  NUTRITION DIAGNOSIS:   Severe Malnutrition related to chronic illness(pancreatic mass) as evidenced by severe fat depletion, severe muscle depletion.  GOAL:   Patient will meet greater than or equal to 90% of their needs  MONITOR:   PO intake, Supplement acceptance, Labs, Weight trends, I & O's  REASON FOR ASSESSMENT:   Malnutrition Screening Tool    ASSESSMENT:   84 y.o. female with history of diabetes mellitus type 2 last hemoglobin A1c last week was 16, sick sinus syndrome status post pacemaker placement, hypertension, chronic kidney disease stage IV, peripheral arterial disease who was admitted last week for severe hypokalemia and hyperosmolar status secondary to uncontrolled diabetes was discharged 2 days ago to home and was brought back today because of persistent nausea vomiting since morning.   Patient was just discharged from [facility] on 7/15. Was provided Glucerna shakes during that admission and was eating very little. Prior to that admission ,pt was reported to eating less with poor appetite for about a week. Poor PO continued following discharge and pt developed N/V. Pt not eating much at this time. Will continue Glucerna shakes and d/c Ensure supplements.  Per chart, UBW is 100 lb. Pt's weight has increased. Suspect not a true weight gain given pt's poor PO intake over the last week.   Labs reviewed. Medications reviewed.  NUTRITION - FOCUSED PHYSICAL EXAM:    Most Recent Value  Orbital Region  Moderate depletion  Upper Arm Region  Severe depletion  Thoracic and Lumbar Region  Unable to assess  Buccal Region  Severe depletion  Temple Region  Severe depletion  Clavicle Bone Region  Severe depletion  Clavicle and Acromion Bone Region   Severe depletion  Scapular Bone Region  Unable to assess  Dorsal Hand  Moderate depletion  Patellar Region  Severe depletion  Anterior Thigh Region  Severe depletion  Posterior Calf Region  Severe depletion  Edema (RD Assessment)  None       Diet Order:   Diet Order           Diet full liquid Room service appropriate? Yes; Fluid consistency: Thin  Diet effective now          EDUCATION NEEDS:   Not appropriate for education at this time  Skin:  Skin Assessment: Reviewed RN Assessment  Last BM:  7/13  Height:   Ht Readings from Last 1 Encounters:  09/20/17 5\' 2"  (1.575 m)    Weight:   Wt Readings from Last 1 Encounters:  09/21/17 114 lb 3.2 oz (51.8 kg)    Ideal Body Weight:  50 kg  BMI:  Body mass index is 20.89 kg/m.  Estimated Nutritional Needs:   Kcal:  6222-9798  Protein:  65-75g  Fluid:  1.5L/day   Clayton Bibles, MS, RD, LDN [place] Dietitian Pager: [phone] After Hours Pager: [phone]

## 2017-09-21 NOTE — Consult Note (Signed)
[facility] Consult: 11:36 AM 09/21/2017  LOS: 1 day    Referring Provider: Dr Waldron Labs  Primary Care Physician:  Glendale Chard, MD Primary Gastroenterologist:  Dr Benson Norway Dr Deatra Ina, 2013 inpt Colonoscopy    Reason for Consultation:  Nausea, vomiting.  Abnormal CT findings.     HPI: MARJAN ROSMAN is a 84 y.o. female.  PMH IDDM.  CKD stage 4-5.  ASPVD, s/p renal artery stent and angioplsty for in-stent restenosis.  CVA. S/p carotid endarterectomy.  Degenerative spine, disc and joint disease.  S/p remote hemorrhoidectomy.  Stenosis of SMA, IMA , iliacs, renal arteries on CT angio 04/2012.  Changes of chronic pancreatitis noted on CT scan 04/2012, 01/2015.  Liver hemangioma noted on ultrasounds 08/2012, 03/2013.   05/2003 NM gastric emptying study.  Borderline delayed gastric emptying 05/2003 EGD.  Dr Benson Norway.  Path: gastritis.   05/2010 Colonoscopy  Dr Benson Norway  For FOB + stool.   Multiple polyps. Sized 3 mm to 2 cm.  Large polyp was SPOT tatood. Pathology: TAs and sigmoid TVA with HGD.   Immunohystochemical stains on this did not confirm Microsattelite instability.  Large hemorrhoids.  Colonic spasm.  Poor prep but able to get good view with washing.  07/2011 Colonoscopy.  For acute, large volume hematochezia.  Scattered diverticula.  Diverticular bleed suspected.     07/2011 EGD.  Normal.     Admission 7/12-7/15/19 with uncontrolled DM (Hgb A1c 16.2), hyperosmolar nonketotic hyperglycemia, htn, FTT, malnutrition, hypokalemia, AKI, diarrhea (resolved), ? UTI (3 d Rocephin, negative clx), PICC line placement. 2D Echo with EF 50 to 55% and mild pulmonary hypertension.     Plavix and Furosemide discontinued.    Readmitted 48 hours post discharge.  yest AM had repeated episodes of vomiting, not described as bloody,dark or CG.  Some  abd discomfort.  No fever, dysuria.   Last BM was the diarrhea leading up to recent admission, so at least 5 days ago.  Normally has stools every few days.  Appetite normally good and can not gain weight despite good po intake.  Wt per 07/2011 note 140#.   BMET suggestive of dehydration with BUN 14.3 (12.5 after hydration). 12.3 at recent discharge.  Alk Phos 131, Albumin 1.9, O/w LFTs normal.  Glucose in 1teens.   WBCs normal.     CT abdomen/pelvis without contrast.   Underdistended stomach.  Chronic pancreatitis.  Mass vs abscess in tail of pancreas.  Diffuse colon wall thickening, ? Under distention, 3rd spacing fluid, ? Colitis. Vascular disease in aortic branch vessels, iliac/femoral vessels.     Patient used to drink alcohol heavily but she has not drunk anything in at least 20 years  Past Medical History:  Diagnosis Date  . Anxiety   . Arthritis    "everywhere"  . Cataract    "both eyes; blood pressure's always too high to have them fixed" (09/11/2014)  . Chronic lower back pain   . Claudication in peripheral vascular disease ([place])    02/16/10: Left CIA 9.0x28 Omnilink and REIA 8.0x40 seff expanding Zilver.  right subclavian artery stent 03/18/2008,  . Coronary artery disease   . Herniated lumbar intervertebral disc   . Hypertension   . IDDM (insulin dependent diabetes mellitus) ([place])   . Migraine    "used to have terrible migraines; stopped in the 1990's"  . Pacemaker   . Peripheral vascular disease ([place])   . Pneumonia X 2  . Renal disorder   . Renovascular hypertension     s/p left renal artery stent 12/2007.  S/P balloon angioplasty on 02/16/10 for ISR, BP was  controlled well since then.     . Stroke [facility]) 1999   Stroke and TIA in 1999 with right sided weakness, now with residual right arm weakness (09/11/2014)  . UTI (lower urinary tract infection) 02/2015    Past Surgical History:  Procedure Laterality Date  . ABDOMINAL ANGIOGRAM N/A 07/06/2011   Procedure: ABDOMINAL  ANGIOGRAM;  Surgeon: Laverda Page, MD;  Location[facility] C[facility] LAB;  Service: Cardiovascular;  Laterality: N/A;  . APPENDECTOMY    . BACK SURGERY    . CAROTID ENDARTERECTOMY Left 1991    Stroke and TIA in 1999 with right sided weakness, now with residual right arm weakness  . CARPAL TUNNEL RELEASE Left   . COLONOSCOPY  07/30/2011   Procedure: COLONOSCOPY;  Surgeon: Inda Castle, MD;  Location: [place];  Service: Endoscopy;  Laterality: N/A;  gi bleed  . ESOPHAGOGASTRODUODENOSCOPY  07/30/2011   Procedure: ESOPHAGOGASTRODUODENOSCOPY (EGD);  Surgeon: Inda Castle, MD;  Location: [place];  Service: Endoscopy;  Laterality: N/A;  . EXCISIONAL HEMORRHOIDECTOMY    . INSERT / REPLACE / REMOVE PACEMAKER    . IR FLUORO GUIDE CV LINE RIGHT  07/26/2016  . IR FLUORO GUIDE CV LINE RIGHT  09/16/2017  . IR US GUIDE VASC ACCESS RIGHT  07/26/2016  . IR US GUIDE VASC ACCESS RIGHT  09/16/2017  . LOWER EXTREMITY ANGIOGRAM Right 05/29/2012   Procedure: LOWER EXTREMITY ANGIOGRAM;  Surgeon: Laverda Page, MD;  Location: [facility] CATH LAB;  Service: Cardiovascular;  Laterality: Right;  . LUMBAR LAMINECTOMY     'herniated disc"  . PERIPHERAL VASCULAR CATHETERIZATION N/A 11/05/2014   Procedure: Abdominal Aortogram;  Surgeon: Serafina Mitchell, MD;  Location: [place] CV LAB;  Service: Cardiovascular;  Laterality: N/A;  . PERMANENT PACEMAKER INSERTION Left 06/28/2011   Procedure: PERMANENT PACEMAKER INSERTION;  Surgeon: Deboraha Sprang, MD;  Location: [facility] CATH LAB;  Service: Cardiovascular;  Laterality: Left;  . RENAL ANGIOGRAM N/A 07/06/2011   Procedure: RENAL ANGIOGRAM;  Surgeon: Laverda Page, MD;  Location: [facility] CATH LAB;  Service: Cardiovascular;  Laterality: N/A;  . TONSILLECTOMY    . URETERAL STENT PLACEMENT    . VAGINAL HYSTERECTOMY      Prior to Admission medications   Medication Sig Start Date End Date Taking? Authorizing Provider  aspirin EC 81 MG tablet Take 81 mg by mouth daily.   Yes [provider]  calcium-vitamin D (OSCAL WITH D) 500-200 MG-UNIT tablet Take 2 tablets by mouth 4 (four) times daily. 07/27/16  Yes Eugenie Filler, MD  diclofenac sodium (VOLTAREN) 1 % GEL Apply 2 g topically 4 (four) times daily.  04/28/17  Yes [provider]  feeding supplement, GLUCERNA SHAKE, (GLUCERNA SHAKE) LIQD Take 237 mLs by mouth 3 (three) times daily between meals. 09/19/17  Yes Eugenie Filler, MD  hydrALAZINE (APRESOLINE) 10 MG tablet Take 1 tablet (10 mg total) by mouth 3 (three) times daily. 05/10/17  Yes Hosie Poisson,  MD  insulin NPH-regular Human (NOVOLIN 70/30) (70-30) 100 UNIT/ML injection Inject 5 Units into the skin 2 (two) times daily. 10/30/15  Yes Short, Noah Delaine, MD  isosorbide mononitrate (IMDUR) 120 MG 24 hr tablet Take 1 tablet (120 mg total) by mouth daily. Resume in 2 days. 05/10/17  Yes Hosie Poisson, MD  metoprolol tartrate (LOPRESSOR) 25 MG tablet Take 25 mg by mouth 2 (two) times daily.   Yes [provider]  Omega-3 Fatty Acids (FISH OIL) 1000 MG CAPS Take 1,000 mg by mouth 2 (two) times daily.    Yes [provider]  Vitamin D, Ergocalciferol, (DRISDOL) 50000 units CAPS capsule Take 1 capsule (50,000 Units total) by mouth every 7 (seven) days. 05/17/17  Yes Hosie Poisson, MD  acetaminophen (TYLENOL) 325 MG tablet Take 2 tablets (650 mg total) by mouth every 4 (four) hours as needed for headache or mild pain. 01/15/16   Regalado, Belkys A, MD  bisacodyl (DULCOLAX) 5 MG EC tablet Take 1 tablet (5 mg total) by mouth daily as needed for moderate constipation. 09/19/17   Eugenie Filler, MD  aliskiren (TEKTURNA) 150 MG tablet Take 150 mg by mouth daily.  05/29/11  [provider]  gabapentin (NEURONTIN) 100 MG capsule Take 1 capsule (100 mg total) by mouth 3 (three) times daily. 04/06/11 05/29/11  Nita Sells, MD    Scheduled Meds: . aspirin EC  81 mg Oral Daily  . famotidine  20 mg Oral Daily  . feeding supplement  (GLUCERNA SHAKE)  237 mL Oral TID BM  . hydrALAZINE  10 mg Oral TID  . insulin aspart protamine- aspart  5 Units Subcutaneous BID WC  . isosorbide mononitrate  120 mg Oral Daily  . lip balm      . metoprolol tartrate  25 mg Oral BID  . omega-3 acid ethyl esters  1 g Oral Daily   Infusions:  PRN Meds: acetaminophen **OR** acetaminophen, bisacodyl, HYDROcodone-acetaminophen, ondansetron **OR** ondansetron (ZOFRAN) IV   Allergies as of 09/20/2017 - Review Complete 09/20/2017  Allergen Reaction Noted  . Norvasc [amlodipine besylate] Swelling 04/03/2011  . Peanut-containing drug products Other (See Comments) 07/20/2011    Family History  Problem Relation Age of Onset  . Cancer Father        Colon cancer  . Heart attack Mother     Social History   Socioeconomic History  . Marital status: Married    Spouse name: Not on file  . Number of children: Not on file  . Years of education: Not on file  . Highest education level: Not on file  Occupational History  . Not on file  Social Needs  . Financial resource strain: Not on file  . Food insecurity:    Worry: Not on file    Inability: Not on file  . Transportation needs:    Medical: Not on file    Non-medical: Not on file  Tobacco Use  . Smoking status: Former Smoker    Packs/day: 0.00    Years: 70.00    Pack years: 0.00    Types: Cigarettes  . Smokeless tobacco: Never Used  . Tobacco comment: 09/11/2014 "I used to smoke very heavy; down to 1 cigarette/day"  Substance and Sexual Activity  . Alcohol use: No    Comment: "stopped drinking back in the 1970's"  . Drug use: No  . Sexual activity: Never  Lifestyle  . Physical activity:    Days per week: Not on file    Minutes per  session: Not on file  . Stress: Not on file  Relationships  . Social connections:    Talks on phone: Not on file    Gets together: Not on file    Attends religious service: Not on file    Active member of club or organization: Not on file     Attends meetings of clubs or organizations: Not on file    Relationship status: Not on file  . Intimate partner violence:    Fear of current or ex partner: Not on file    Emotionally abused: Not on file    Physically abused: Not on file    Forced sexual activity: Not on file  Other Topics Concern  . Not on file  Social History Narrative  . Not on file    REVIEW OF SYSTEMS: Constitutional: Generalized weakness, this is worse in the past week or so.  Family says that normally she can ambulate around the house with a walker, cane or furniture surfing.  She does not stay in bed all day. ENT:  No nose bleeds Pulm: No cough, no dyspnea. CV:  No palpitations, no LE edema.  No chest pain. GU: Oliguric.  All of Monday she did not have any urination. GI:  Per HPI Heme:  No unusual bleeding or bruising.  No recall of having to take iron in the past. Transfusions:  2 U in 07/2011 at time of LGIB.     Neuro:  No headaches, no peripheral tingling or numbness Derm:  No itching, no rash or sores.  Endocrine:  No sweats or chills.  No polyuria or dysuria Immunization: Not queried. Travel:  None beyond local counties in last few months.    PHYSICAL EXAM: Vital signs in last 24 hours: Vitals:   09/20/17 2329 09/21/17 0514  BP: (!) 192/74 (!) 166/60  Pulse: 63 69  Resp: 12 12  Temp: 97.8 F (36.6 C) 98.3 F (36.8 C)  SpO2: 98% 97%   Wt Readings from Last 3 Encounters:  09/21/17 114 lb 3.2 oz (51.8 kg)  09/18/17 106 lb 4.2 oz (48.2 kg)  05/08/17 89 lb 15.2 oz (40.8 kg)    General: Frail, alert, comfortable.  Not toxic appearing but looks chronically ill.  Difficult to understand her speech. Head: No facial asymmetry or swelling. Eyes: No scleral icterus.  No conjunctival pallor. Ears: Slight hearing deficit. Nose: No congestion, no discharge Mouth: Tongue midline.  Oropharynx pink, moist, clear.  No teeth. Neck: No JVD, no masses, no thyromegaly. Lungs: Diminished breath sounds but  clear bilaterally Heart: RRR.  No MRG.  S1, S2 present Abdomen: Not tender or distended.  Scant but normal quality bowel sounds.  No HSM, masses, bruits..   Rectal: Light to medium brown, soft stool in the vault is FOBT negative. Musc/Skeltl: No joint redness or swelling. Extremities: Slight, nonpitting, pedal edema. Neurologic: Alert.  Follows commands.  Appropriate.  Moves all 4 limbs, strength not tested.  No asterixis or tremors. Skin: No suspicious rash, sores, lesions. Tattoos: None observed. Nodes: No cervical adenopathy. Psych: Cooperative  Intake/Output from previous day: 07/16 0701 - 07/17 0700 In: 120 [P.O.:120] Out: -  Intake/Output this shift: No intake/output data recorded.  LAB RESULTS: Recent Labs    09/19/17 0409 09/20/17 1449 09/20/17 1525 09/20/17 1532  WBC 6.2  --  4.9  --   HGB 12.3 14.3  14.3 12.5 12.9  HCT 36.3 42.0  42.0 36.8 38.0  PLT 161  --  168  --  BMET Lab Results  Component Value Date   NA 138 09/20/2017   NA 135 09/20/2017   NA 135 09/20/2017   K 4.9 09/20/2017   K 8.0 (HH) 09/20/2017   K 8.0 (HH) 09/20/2017   CL 108 09/20/2017   CL 107 09/20/2017   CL 107 09/20/2017   CO2 23 09/20/2017   CO2 25 09/19/2017   CO2 26 09/18/2017   GLUCOSE 112 (H) 09/20/2017   GLUCOSE 112 (H) 09/20/2017   GLUCOSE 112 (H) 09/20/2017   BUN 38 (H) 09/20/2017   BUN 62 (H) 09/20/2017   BUN 62 (H) 09/20/2017   CREATININE 2.20 (H) 09/20/2017   CREATININE 2.30 (H) 09/20/2017   CREATININE 2.30 (H) 09/20/2017   CALCIUM 7.8 (L) 09/20/2017   CALCIUM 7.7 (L) 09/19/2017   CALCIUM 7.7 (L) 09/18/2017   LFT Recent Labs    09/20/17 1444  PROT 5.1*  ALBUMIN 1.9*  AST 35  ALT 24  ALKPHOS 131*  BILITOT 0.6   PT/INR Lab Results  Component Value Date   INR 1.05 09/20/2017   INR 0.97 11/17/2016   INR 1.11 02/15/2015   Hepatitis Panel No results for input(s): HEPBSAG, HCVAB, HEPAIGM, HEPBIGM in the last 72 hours. C-Diff No components found for:  CDIFF Lipase     Component Value Date/Time   LIPASE 15 09/20/2017 1444    Drugs of Abuse     Component Value Date/Time   LABOPIA NONE DETECTED 11/17/2016 1643   COCAINSCRNUR NONE DETECTED 11/17/2016 1643   LABBENZ NONE DETECTED 11/17/2016 1643   AMPHETMU NONE DETECTED 11/17/2016 1643   THCU NONE DETECTED 11/17/2016 1643   LABBARB NONE DETECTED 11/17/2016 1643     RADIOLOGY STUDIES: Ct Abdomen Pelvis Wo Contrast  Result Date: 09/20/2017 CLINICAL DATA:  84 year old female recently treated for low potassium and bacterial infection. Possible UTI. Failure to thrive. Initial encounter. EXAM: CT ABDOMEN AND PELVIS WITHOUT CONTRAST TECHNIQUE: Multidetector CT imaging of the abdomen and pelvis was performed following the standard protocol without IV contrast. COMPARISON:  01/14/2015 CT. FINDINGS: Lower chest: Basilar atelectasis with small pleural effusions. Subtle basilar infiltrates secondary consideration. Heart size within normal limits. Biventricular pacer in place. Tip is in the region of the right atrium and right ventricle. Three vessel coronary artery calcification. Hepatobiliary: Taking into account limitation by non contrast imaging, no worrisome hepatic lesion. No calcified gallstones. Pancreas: Evidence of prior chronic pancreatitis with calcifications throughout the pancreas. Additionally, in the pancreatic tail region extending into the lesser sac and anterior to the splenic artery is a 5.2 x 2.8 x 3.3 cm abnormality (series 2, image 19 and series 6, image 71). This may represent pancreatic mass or abscess. Spleen: Tiny splenic calcification.  No splenomegaly. Adrenals/Urinary Tract: No hydronephrosis or obstructing stone. Right renal cyst. Taking into account limitation by non contrast imaging, no worrisome renal or adrenal abnormality. Noncontrast filled urinary bladder with tiny amount a gas which may be related to recent manipulation. Clinical correlation recommended. Small cystocele.  Mild circumferential wall thickening. Stomach/Bowel: Long segment of colon is under distended with mild circumferential wall thickening. Third spacing of fluid limits evaluation for the detection of extraluminal bowel inflammatory process. In the proper clinical setting, colitis is a consideration. Stomach under distended and evaluation limited. Decompressed gastric walls appear slightly thickened. Gastritis not excluded. Limited evaluation of small bowel secondary to lack of fat planes. Vascular/Lymphatic: Prominent aortic calcification without aneurysm. Prominent aortic branch vessel atherosclerotic changes and iliac artery/femoral artery atherosclerotic changes with probable areas of  moderate to marked narrowing. Scattered small lymph nodes. Reproductive: No worrisome adnexal mass. Other: No free air pneumatosis or portal venous gas. No bowel containing hernia. Third spacing of fluid. Musculoskeletal: Fusion L5-S1. Chronic compression deformity L1-L2 and L4 without change. Scoliosis and degenerative changes. Hip joint degenerative changes. IMPRESSION: Third spacing of fluid, lack of fat planes and lack of contrast limited evaluation. Evidence of chronic pancreatitis. Additionally, in the pancreatic tail region extending into the lesser sac and anterior to the splenic artery is a 5.2 x 2.8 x 3.3 cm abnormality (series 2, image 19 and series 6, image 71). This may represent a pancreatic mass or abscess. Diffuse circumferential colonic wall thickening may be related to under distension and third spacing of fluid although colitis not excluded in proper clinical setting. Under distended stomach with gastric wall thickening and therefore gastritis not excluded in proper clinical setting. Aortic Atherosclerosis (ICD10-I70.0). Prominent aortic branch vessel atherosclerotic changes and iliac artery/femoral artery atherosclerotic changes with probable areas of moderate to marked narrowing. Bibasilar atelectasis with small  pleural effusions. Subtle basilar infiltrates is a secondary consideration. Electronically Signed   By: Genia Del M.D.   On: 09/20/2017 18:51   Dg Chest Port 1 View  Result Date: 09/20/2017 CLINICAL DATA:  84 year old with acute mental status changes, generalized weakness and failure to thrive. EXAM: PORTABLE CHEST 1 VIEW COMPARISON:  05/06/2017, 07/25/2016 and earlier. FINDINGS: Cardiac silhouette UPPER normal in size for AP portable technique, unchanged. Thoracic aorta atherosclerotic. Hilar and mediastinal contours otherwise unremarkable. Suboptimal inspiration with atelectasis in the lung bases, LEFT greater than RIGHT. Lungs otherwise clear. Pulmonary vascularity normal. No visible pleural effusions. Severe generalized osseous demineralization. Severe degenerative changes involving the RIGHT shoulder. Vascular stent in what I suspect is the RIGHT common carotid artery. IMPRESSION: Suboptimal inspiration which accounts for bibasilar atelectasis, LEFT greater than RIGHT. No acute cardiopulmonary disease otherwise. Electronically Signed   By: Evangeline Dakin M.D.   On: 09/20/2017 14:40   [place] Ekg Site Rite  Result Date: 09/20/2017 If Site Rite image not attached, placement could not be confirmed due to current cardiac rhythm.     IMPRESSION:   *    Nausea, abdominal discomfort resolved. Hx of borderline delay in gastric emptying on 2005 GES.  Per family's hx pt does not have chronic issues with N/V despite the uncontrolled DM.   Current CT abnormalities including changes consistent with chronic pancreatitis and lesion in the tail of the pancreas.  Possible diffuse colitis.  Diffuse vascular disease. If this truly is colitis, ischemia could be culprit given known PVD and aortoiliac vasc dz but her non-tender abdominal exam in ED and today, FOBT negative brown stool, this is less likely.      *  Hx adenomatous colon polyps on 2012 colonoscopy, none seen on 2013 colonoscopy.  No colonoscopy  since.  *  Hx gastritis per path report from 2005.  No PPI or H2B at home, on PO Famotidine currently.     *   External hemorrhoids, no reports of rectal bleeding.  Previous, remote hemorrhoidectomy  *   Poorly controlled IDDM with recent admission with hyperosmolar state.  *    Stable, normocytic anemia.  *    Hypertension.     PLAN:     *   Allow clears and advance diet as tolerated.  *   ? outpt EUS?Marland Kitchen   Do not think she is candidate for colon polyp surveillance.  Given resolution of vomiting/nausea, will defer decision re  EGD to Dr Hilarie Fredrickson.     *   Will order CA-19-9 for AM.     Azucena Freed  09/21/2017, 11:36 AM Phone [phone]

## 2017-09-21 NOTE — Progress Notes (Signed)
Left upper extremity venous duplex has been completed. Negative for DVT.  09/21/17 10:41 AM Carmen [name] RVT

## 2017-09-22 ENCOUNTER — Encounter: Payer: Self-pay | Admitting: Gastroenterology

## 2017-09-22 DIAGNOSIS — Z515 Encounter for palliative care: Secondary | ICD-10-CM

## 2017-09-22 DIAGNOSIS — N390 Urinary tract infection, site not specified: Secondary | ICD-10-CM

## 2017-09-22 DIAGNOSIS — Z7189 Other specified counseling: Secondary | ICD-10-CM

## 2017-09-22 LAB — GLUCOSE, CAPILLARY
Glucose-Capillary: 99 mg/dL (ref 70–99)
Glucose-Capillary: 184 mg/dL — ABNORMAL HIGH (ref 70–99)
Glucose-Capillary: 147 mg/dL — ABNORMAL HIGH (ref 70–99)

## 2017-09-22 MED ORDER — HYDROCODONE-ACETAMINOPHEN 10-325 MG PO TABS: 1.0000 | ORAL_TABLET | Freq: Four times a day (QID) | ORAL | 0 refills | Status: AC | PRN

## 2017-09-22 MED ORDER — CEPHALEXIN 250 MG PO CAPS: 250.0000 mg | ORAL_CAPSULE | Freq: Three times a day (TID) | ORAL | 0 refills | Status: AC

## 2017-09-22 NOTE — Progress Notes (Signed)
Patient has discharged to home on 09/22/17. Discharge instruction including medication and appointment was given to patient and family member at bedside. No question at this time. RN also re-inforced education about diet and activity.

## 2017-09-22 NOTE — Consult Note (Signed)
Consultation Note Date: 09/22/2017   Patient Name: Carmen [name]  DOB: 08-06-1933  MRN: [id number]  Age / Sex: 84 y.o., female  PCP: Glendale Chard, MD Referring Physician: Albertine Patricia, MD  Reason for Consultation: Establishing goals of care  HPI/Patient Profile: 84 y.o. female  with past medical history of DM type 2, sick sinus syndrome (s/p PPM), HTN, CKD, PAD, CAD, CVA, and recent admit for hyperosmolar and hypokalemia admitted on 09/20/2017 with nausea, vomiting and found to also have pancreatic mass vs abscess on imaging.  Palliative care consulted for goals of care.  Clinical Assessment and Goals of Care: I met today with patient's daughter and granddaughter.   I introduced palliative care as specialized medical care for people living with serious illness. It focuses on providing relief from the symptoms and stress of a serious illness. The goal is to improve quality of life for both the patient and the family.  She reports that the most important things to her are her family, faith, and being at her own home.  We discussed clinical course as well as wishes moving forward in regard to advanced directives, particularly in light of new discovery of pancreatic mass/abscess.  Values and goals of care important to patient and family were attempted to be elicited.    We discussed difference between a aggressive medical intervention path and a palliative, comfort focused care path.  She reports wanting to be at home, and family is in agreement that she will best be served by home hospice services on discharge.  Questions and concerns addressed.   PMT will continue to support holistically.  SUMMARY OF RECOMMENDATIONS   - Home with hospice.  Patient reports preference for [facility] as family may want to utilize [employer] [facility] in the future.  Code Status/Advance Care Planning:  DNR  Palliative  Prophylaxis:   Frequent Pain Assessment  Additional Recommendations (Limitations, Scope, Preferences):  Avoid Hospitalization  Psycho-social/Spiritual:   Desire for further Chaplaincy support:no  Additional Recommendations: Education on Hospice  Prognosis:   < 6 months- She has multiple comorbid conditions, including uncontrolled DM with recurrent hospitalizations and continued functional decline.  She also has newly discovered pancreatic mass with plan to forgo further workup for it.  With focus on comfort and hospice, I anticipate her prognosis will be less than 6 months if her disease follows its natural course.  Discharge Planning: Home with Hospice      Primary Diagnoses: Present on Admission: . Failure to thrive in adult . CKD (chronic kidney disease), stage III ([place]) . HTN (hypertension) . Uncontrolled type 2 diabetes mellitus with hyperglycemia ([place]) . Pancreatic mass . Nausea & vomiting   I have reviewed the medical record, interviewed the patient and family, and examined the patient. The following aspects are pertinent.  Past Medical History:  Diagnosis Date  . Anxiety   . Arthritis    "everywhere"  . Cataract    "both eyes; blood pressure's always too high to have them fixed" (09/11/2014)  . Chronic lower back  pain   . Claudication in peripheral vascular disease ([place])    02/16/10: Left CIA 9.0x28 Omnilink and REIA 8.0x40 seff expanding [place]il[place].   right subclavian artery stent 03/18/2008,  . Coronary artery disease   . Herniated lumbar intervertebral disc   . Hypertension   . IDDM (insulin dependent diabetes mellitus) ([place])   . Migraine    "used to have terrible migraines; stopped in the 1990's"  . Pacemaker   . Peripheral vascular disease ([place])   . Pneumonia X 2  . Renal disorder   . Renovascular hypertension     s/p left renal artery stent 12/2007.  S/P balloon angioplasty on 02/16/10 for ISR, BP was controlled well since then.     . Stroke [facility]) 1999     Stroke and TIA in 1999 with right sided weakness, now with residual right arm weakness (09/11/2014)  . UTI (lower urinary tract infection) 02/2015   Social History   Socioeconomic History  . Marital status: Married    Spouse name: Not on file  . Number of children: Not on file  . Years of education: Not on file  . Highest education level: Not on file  Occupational History  . Not on file  Social Needs  . Financial resource strain: Not on file  . Food insecurity:    Worry: Not on file    Inability: Not on file  . Transportation needs:    Medical: Not on file    Non-medical: Not on file  Tobacco Use  . Smoking status: Former Smoker    Packs/day: 0.00    Years: 70.00    Pack years: 0.00    Types: Cigarettes  . Smokeless tobacco: Never Used  . Tobacco comment: 09/11/2014 "I used to smoke very heavy; down to 1 cigarette/day"  Substance and Sexual Activity  . Alcohol use: No    Comment: "stopped drinking back in the 1970's"  . Drug use: No  . Sexual activity: Never  Lifestyle  . Physical activity:    Days per week: Not on file    Minutes per session: Not on file  . Stress: Not on file  Relationships  . Social connections:    Talks on phone: Not on file    Gets together: Not on file    Attends religious service: Not on file    Active member of club or organization: Not on file    Attends meetings of clubs or organizations: Not on file    Relationship status: Not on file  Other Topics Concern  . Not on file  Social History Narrative  . Not on file   Family History  Problem Relation Age of Onset  . Cancer Father        Colon cancer  . Heart attack Mother    Scheduled Meds: . aspirin EC  81 mg Oral Daily  . cephALEXin  250 mg Oral Q8H  . famotidine  20 mg Oral Daily  . feeding supplement (GLUCERNA SHAKE)  237 mL Oral TID BM  . hydrALAZINE  10 mg Oral TID  . insulin aspart protamine- aspart  5 Units Subcutaneous BID WC  . isosorbide mononitrate  120 mg Oral Daily   . metoprolol tartrate  25 mg Oral BID  . omega-3 acid ethyl esters  1 g Oral Daily   Continuous Infusions: PRN Meds:.acetaminophen **OR** acetaminophen, bisacodyl, HYDROcodone-acetaminophen, ondansetron **OR** ondansetron (ZOFRAN) IV Medications Prior to Admission:  Prior to Admission medications   Medication Sig Start Date End  Date Taking? Authorizing Provider  aspirin EC 81 MG tablet Take 81 mg by mouth daily.   Yes [provider]  calcium-vitamin D (OSCAL WITH D) 500-200 MG-UNIT tablet Take 2 tablets by mouth 4 (four) times daily. 07/27/16  Yes Eugenie Filler, MD  diclofenac sodium (VOLTAREN) 1 % GEL Apply 2 g topically 4 (four) times daily.  04/28/17  Yes [provider]  feeding supplement, GLUCERNA SHAKE, (GLUCERNA SHAKE) LIQD Take 237 mLs by mouth 3 (three) times daily between meals. 09/19/17  Yes Eugenie Filler, MD  hydrALAZINE (APRESOLINE) 10 MG tablet Take 1 tablet (10 mg total) by mouth 3 (three) times daily. 05/10/17  Yes Hosie Poisson, MD  insulin NPH-regular Human (NOVOLIN 70/30) (70-30) 100 UNIT/ML injection Inject 5 Units into the skin 2 (two) times daily. 10/30/15  Yes Short, Noah Delaine, MD  isosorbide mononitrate (IMDUR) 120 MG 24 hr tablet Take 1 tablet (120 mg total) by mouth daily. Resume in 2 days. 05/10/17  Yes Hosie Poisson, MD  metoprolol tartrate (LOPRESSOR) 25 MG tablet Take 25 mg by mouth 2 (two) times daily.   Yes [provider]  Omega-3 Fatty Acids (FISH OIL) 1000 MG CAPS Take 1,000 mg by mouth 2 (two) times daily.    Yes [provider]  Vitamin D, Ergocalciferol, (DRISDOL) 50000 units CAPS capsule Take 1 capsule (50,000 Units total) by mouth every 7 (seven) days. 05/17/17  Yes Hosie Poisson, MD  acetaminophen (TYLENOL) 325 MG tablet Take 2 tablets (650 mg total) by mouth every 4 (four) hours as needed for headache or mild pain. 01/15/16   Regalado, Belkys A, MD  bisacodyl (DULCOLAX) 5 MG EC tablet Take 1 tablet (5 mg total) by  mouth daily as needed for moderate constipation. 09/19/17   Eugenie Filler, MD  aliskiren (TEKTURNA) 150 MG tablet Take 150 mg by mouth daily.  05/29/11  [provider]  gabapentin (NEURONTIN) 100 MG capsule Take 1 capsule (100 mg total) by mouth 3 (three) times daily. 04/06/11 05/29/11  Nita Sells, MD   Allergies  Allergen Reactions  . Norvasc [Amlodipine Besylate] Swelling  . Peanut-Containing Drug Products Other (See Comments)    Stomach pain   Review of Systems  Not very interactive.  Denies complaints currently.  Physical Exam General: Alert, awake, in no acute distress.  HEENT: No bruits, no goiter, no JVD Heart: Regular rate and rhythm. No murmur appreciated. Lungs: Fair air movement, clear Abdomen: Soft, nontender, nondistended, positive bowel sounds.  Ext: No significant edema Skin: Warm and dry  Vital Signs: BP (!) 148/53 (BP Location: Left Arm)   Pulse 71   Temp 99.5 F (37.5 C) (Oral)   Resp 16   Ht 5' 2"  (1.575 m)   Wt 52.7 kg (116 lb 2.9 oz)   SpO2 97%   BMI 21.25 kg/m  Pain Scale: 0-10 POSS *See Group Information*: S-Acceptable,Sleep, easy to arouse Pain Score: Asleep   SpO2: SpO2: 97 % O2 Device:SpO2: 97 % O2 Flow Rate: .   IO: Intake/output summary:   Intake/Output Summary (Last 24 hours) at 09/22/2017 1306 Last data filed at 09/22/2017 8250 Gross per 24 hour  Intake 240 ml  Output 500 ml  Net -260 ml    LBM: Last BM Date: 09/18/17 Baseline Weight: Weight: 40.4 kg (89 lb) Most recent weight: Weight: 52.7 kg (116 lb 2.9 oz)     Palliative Assessment/Data:   Flowsheet Rows     Most Recent Value  Intake Tab  Referral Department  Hospitalist  Unit at Time of Referral  Med/Surg Unit  Palliative Care Primary Diagnosis  Other (Comment) [Endocrine]  Date Notified  09/21/17  Palliative Care Type  Return patient Palliative Care  Reason for referral  Clarify Goals of Care  Date of Admission  09/20/17  Date first seen by  Palliative Care  09/22/17  # of days Palliative referral response time  1 Day(s)  # of days IP prior to Palliative referral  1  Clinical Assessment  Palliative Performance Scale Score  30%  Psychosocial & Spiritual Assessment  Palliative Care Outcomes  Patient/Family meeting held?  Yes  Who was at the meeting?  Daughter, granddaughter  Palliative Care Outcomes  Clarified goals of care      Time In: 1300 Time Out: 1420 Time Total: 80 Greater than 50%  of this time was spent counseling and coordinating care related to the above assessment and plan.  Signed by: Micheline Rough, MD   Please contact Palliative Medicine Team phone at [phone] for questions and concerns.  For individual provider: See Shea Evans

## 2017-09-22 NOTE — Progress Notes (Signed)
Carmen [name]) (07/18 0409) Pulse Rate:  [60-71] 71 (07/18 0409) Resp:  [16-21] 16 (07/18 0409) BP: (148-158)/(53-69) 148/53 (07/18 0409) SpO2:  [96 %-97 %] 97 % (07/18 0409) Weight:  [116 lb 2.9 oz (52.7 kg)] 116 lb 2.9 oz (52.7 kg) (07/18 0500) Last BM Date: 09/18/17 General:  Alert, Well-developed, in NAD Heart:  Regular rate and rhythm; no murmurs Pulm:  CTAB.  No increased WOB. Abdomen:  Soft, non-distended.  BS present.  Non-tender. Extremities:  Without edema. Neurologic:  Alert and grossly normal neurologically.  Intake/Output from previous day: 07/17 0701 - 07/18 0700 In: -  Out: 500 [Urine:500] Intake/Output this shift: Total I/O In: 240 [P.O.:240] Out: -   Lab Results: Recent Labs    09/20/17 1449 09/20/17 1525 09/20/17 1532  WBC  --  4.9  --   HGB 14.3  14.3 12.5 12.9  HCT 42.0  42.0 36.8 38.0  PLT  --  168  --    BMET Recent Labs    09/20/17 1444 09/20/17 1449 09/20/17 1532  NA 140 135  135 138  K 5.8* 8.0*  8.0* 4.9  CL 108 107  107 108  CO2 23  --   --   GLUCOSE 116* 112*  112* 112*  BUN 40* 62*  62* 38*  CREATININE 2.27* 2.30*  2.30* 2.20*  CALCIUM 7.8*  --   --    LFT Recent Labs    09/20/17 1444  PROT 5.1*  ALBUMIN 1.9*  AST 35  ALT 24  ALKPHOS 131*  BILITOT 0.6   PT/INR Recent Labs    09/20/17 1525  LABPROT 13.6  INR 1.05   Hepatitis Panel No results for input(s): HEPBSAG, HCVAB, HEPAIGM, HEPBIGM in the last 72 hours.  Ct Abdomen Pelvis Wo Contrast  Result Date: 09/20/2017 CLINICAL DATA:  84 year old female recently treated for low potassium and bacterial infection.  Possible UTI. Failure to thrive. Initial encounter. EXAM: CT ABDOMEN AND PELVIS WITHOUT CONTRAST TECHNIQUE: Multidetector CT imaging of the abdomen and pelvis was performed following the standard protocol without IV contrast. COMPARISON:  01/14/2015 CT. FINDINGS: Lower chest: Basilar atelectasis with small pleural effusions. Subtle basilar infiltrates secondary consideration. Heart size within normal limits. Biventricular pacer in place. Tip is in the region of the right atrium and right ventricle. Three vessel coronary artery calcification. Hepatobiliary: Taking into account limitation by non contrast imaging, no worrisome hepatic lesion. No calcified gallstones. Pancreas: Evidence of prior chronic pancreatitis with calcifications throughout the pancreas. Additionally, in the pancreatic tail region extending into the lesser sac and anterior to the splenic artery is a 5.2 x 2.8 x 3.3 cm abnormality (series 2, image 19 and series 6, image 71). This may represent pancreatic mass or abscess. Spleen: Tiny splenic calcification.  No splenomegaly. Adrenals/Urinary Tract: No hydronephrosis or obstructing stone. Right renal cyst. Taking into account limitation by non contrast imaging, no worrisome renal or adrenal abnormality. Noncontrast filled urinary bladder with tiny amount a gas which may be related to recent manipulation. Clinical correlation recommended. Small cystocele. Mild circumferential wall thickening. Stomach/Bowel: Long segment of colon is under distended  with mild circumferential wall thickening. Third spacing of fluid limits evaluation for the detection of extraluminal bowel inflammatory process. In the proper clinical setting, colitis is a consideration. Stomach under distended and evaluation limited. Decompressed gastric walls appear slightly thickened. Gastritis not excluded. Limited evaluation of small bowel secondary to lack of fat planes. Vascular/Lymphatic: Prominent aortic calcification without  aneurysm. Prominent aortic branch vessel atherosclerotic changes and iliac artery/femoral artery atherosclerotic changes with probable areas of moderate to marked narrowing. Scattered small lymph nodes. Reproductive: No worrisome adnexal mass. Other: No free air pneumatosis or portal venous gas. No bowel containing hernia. Third spacing of fluid. Musculoskeletal: Fusion L5-S1. Chronic compression deformity L1-L2 and L4 without change. Scoliosis and degenerative changes. Hip joint degenerative changes. IMPRESSION: Third spacing of fluid, lack of fat planes and lack of contrast limited evaluation. Evidence of chronic pancreatitis. Additionally, in the pancreatic tail region extending into the lesser sac and anterior to the splenic artery is a 5.2 x 2.8 x 3.3 cm abnormality (series 2, image 19 and series 6, image 71). This may represent a pancreatic mass or abscess. Diffuse circumferential colonic wall thickening may be related to under distension and third spacing of fluid although colitis not excluded in proper clinical setting. Under distended stomach with gastric wall thickening and therefore gastritis not excluded in proper clinical setting. Aortic Atherosclerosis (ICD10-I70.0). Prominent aortic branch vessel atherosclerotic changes and iliac artery/femoral artery atherosclerotic changes with probable areas of moderate to marked narrowing. Bibasilar atelectasis with small pleural effusions. Subtle basilar infiltrates is a secondary consideration. Electronically Signed   By: Genia Del M.D.   On: 09/20/2017 18:51   Dg Chest Port 1 View  Result Date: 09/20/2017 CLINICAL DATA:  84 year old with acute mental status changes, generalized weakness and failure to thrive. EXAM: PORTABLE CHEST 1 VIEW COMPARISON:  05/06/2017, 07/25/2016 and earlier. FINDINGS: Cardiac silhouette UPPER normal in size for AP portable technique, unchanged. Thoracic aorta atherosclerotic. Hilar and mediastinal contours otherwise  unremarkable. Suboptimal inspiration with atelectasis in the lung bases, LEFT greater than RIGHT. Lungs otherwise clear. Pulmonary vascularity normal. No visible pleural effusions. Severe generalized osseous demineralization. Severe degenerative changes involving the RIGHT shoulder. Vascular stent in what I suspect is the RIGHT common carotid artery. IMPRESSION: Suboptimal inspiration which accounts for bibasilar atelectasis, LEFT greater than RIGHT. No acute cardiopulmonary disease otherwise. Electronically Signed   By: Evangeline Dakin M.D.   On: 09/20/2017 14:40   [place] Ekg Site Rite  Result Date: 09/20/2017 If Site Rite image not attached, placement could not be confirmed due to current cardiac rhythm.  Assessment / Plan: *Nausea, abdominal discomfort resolved. Hx of borderline delay in gastric emptying on 2005 GES.  Per family's hx pt does not have chronic issues with N/V despite the uncontrolled DM.   Current CT abnormalities including changes consistent with chronic pancreatitis and lesion in the tail of the pancreas.  Possible diffuse colitis.  Diffuse vascular disease.  If this truly is colitis, ischemia could be culprit given known PVD and aortoiliac vasc dz but her non-tender abdominal exam in ED and today, FOBT negative brown stool, this is less likely.      *Hx adenomatous colon polyps on 2012 colonoscopy, none seen on 2013 colonoscopy.  No colonoscopy since.  *Hx gastritis per path report from 2005.  No PPI or H2B at home, on PO Famotidine currently.     *Poorly controlled IDDM with recent admission with hyperosmolar state.  *Stable, normocytic anemia.  **Patient going home with hospice.  No pursuing  pancreatic lesion/mass as she is not a candidate for and would not tolerate treatment if it is a malignancy.    LOS: 2 days   Laban Emperor. Zehr  09/22/2017, 11:37 AM

## 2017-09-22 NOTE — Care Management Note (Signed)
Case Management Note  Patient Details  Name: Carmen [name] MRN: [id number] Date of Birth: 1933/06/25  Subjective/Objective:                  Wants to go home with hospice-[facility].called husband as already called and is in the system  Action/Plan: Patient is to be dc to home with hospice today.  Expected Discharge Date:                  Expected Discharge Plan:  Home w Hospice Care  In-House Referral:     Discharge planning Services  CM Consult  Post Acute Care Choice:    Choice offered to:  Patient  DME Arranged:    DME Agency:     HH Arranged:  RN, PT, Nurse's Aide [place] Agency:  [facility]  Status of Service:  Completed, signed off  If discussed at [place] of Stay Meetings, dates discussed:    Additional Comments:  Leeroy Cha, RN 09/22/2017, 1:57 PM

## 2017-09-22 NOTE — Discharge Summary (Signed)
Carmen [name], is a 84 y.o. female  DOB Jul 10, 1933  MRN [id number].  Admission date:  09/20/2017  Admitting Physician  [name], MD  Discharge Date:  09/22/2017   Primary MD  Glendale Chard, MD  Recommendations for primary care physician for things to follow:  -Patient discharged home with home hospice -Please monitor volume status, resume Lasix once appropriate   Admission Diagnosis  Fatigue, unspecified type [R53.83] Vomiting without nausea, intractability of vomiting not specified, unspecified vomiting type [R11.11] Nausea & vomiting [R11.2]   Discharge Diagnosis  Fatigue, unspecified type [R53.83] Vomiting without nausea, intractability of vomiting not specified, unspecified vomiting type [R11.11] Nausea & vomiting [R11.2]    Principal Problem:   Nausea & vomiting Active Problems:   History of CVA (cerebrovascular accident) with associated mild right upper extremity hemiplegia   CKD (chronic kidney disease), stage III (HCC)   HTN (hypertension)   Failure to thrive in adult   Uncontrolled type 2 diabetes mellitus with hyperglycemia ([place])   Pancreatic mass      Past Medical History:  Diagnosis Date  . Anxiety   . Arthritis    "everywhere"  . Cataract    "both eyes; blood pressure's always too high to have them fixed" (09/11/2014)  . Chronic lower back pain   . Claudication in peripheral vascular disease ([place])    02/16/10: Left CIA 9.0x28 Omnilink and REIA 8.0x40 seff expanding Zilver.   right subclavian artery stent 03/18/2008,  . Coronary artery disease   . Herniated lumbar intervertebral disc   . Hypertension   . IDDM (insulin dependent diabetes mellitus) ([place])   . Migraine    "used to have terrible migraines; stopped in the 1990's"  . Pacemaker   . Peripheral vascular disease ([place])   . Pneumonia X 2  . Renal disorder   . Renovascular hypertension     s/p left renal  artery stent 12/2007.  S/P balloon angioplasty on 02/16/10 for ISR, BP was controlled well since then.     . Stroke [facility]) 1999   Stroke and TIA in 1999 with right sided weakness, now with residual right arm weakness (09/11/2014)  . UTI (lower urinary tract infection) 02/2015    Past Surgical History:  Procedure Laterality Date  . ABDOMINAL ANGIOGRAM N/A 07/06/2011   Procedure: ABDOMINAL ANGIOGRAM;  Surgeon: Laverda Page, MD;  Location: [facility] CATH LAB;  Service: Cardiovascular;  Laterality: N/A;  . APPENDECTOMY    . BACK SURGERY    . CAROTID ENDARTERECTOMY Left 1991    Stroke and TIA in 1999 with right sided weakness, now with residual right arm weakness  . CARPAL TUNNEL RELEASE Left   . COLONOSCOPY  07/30/2011   Procedure: COLONOSCOPY;  Surgeon: Inda Castle, MD;  Location: [place];  Service: Endoscopy;  Laterality: N/A;  gi bleed  . ESOPHAGOGASTRODUODENOSCOPY  07/30/2011   Procedure: ESOPHAGOGASTRODUODENOSCOPY (EGD);  Surgeon: Inda Castle, MD;  Location: [place];  Service: Endoscopy;  Laterality: N/A;  . EXCISIONAL HEMORRHOIDECTOMY    .  INSERT / REPLACE / REMOVE PACEMAKER    . IR FLUORO GUIDE CV LINE RIGHT  07/26/2016  . IR FLUORO GUIDE CV LINE RIGHT  09/16/2017  . IR US GUIDE VASC ACCESS RIGHT  07/26/2016  . IR US GUIDE VASC ACCESS RIGHT  09/16/2017  . LOWER EXTREMITY ANGIOGRAM Right 05/29/2012   Procedure: LOWER EXTREMITY ANGIOGRAM;  Surgeon: Laverda Page, MD;  Location: [facility] CATH LAB;  Service: Cardiovascular;  Laterality: Right;  . LUMBAR LAMINECTOMY     'herniated disc"  . PERIPHERAL VASCULAR CATHETERIZATION N/A 11/05/2014   Procedure: Abdominal Aortogram;  Surgeon: Serafina Mitchell, MD;  Location: [place] CV LAB;  Service: Cardiovascular;  Laterality: N/A;  . PERMANENT PACEMAKER INSERTION Left 06/28/2011   Procedure: PERMANENT PACEMAKER INSERTION;  Surgeon: Deboraha Sprang, MD;  Location: [facility] CATH LAB;  Service: Cardiovascular;  Laterality: Left;  . RENAL ANGIOGRAM  N/A 07/06/2011   Procedure: RENAL ANGIOGRAM;  Surgeon: Laverda Page, MD;  Location: [facility] CATH LAB;  Service: Cardiovascular;  Laterality: N/A;  . TONSILLECTOMY    . URETERAL STENT PLACEMENT    . VAGINAL HYSTERECTOMY         History of present illness and  Hospital Course:     Kindly see H&P for history of present illness and admission details, please review complete Labs, Consult reports and Test reports for all details in brief  HPI  from the history and physical done on the day of admission 09/20/2017  HPI: Carmen [name] is a 84 y.o. female with history of diabetes mellitus type 2 last hemoglobin A1c last week was 16, sick sinus syndrome status post pacemaker placement, hypertension, chronic kidney disease stage IV, peripheral arterial disease who was admitted last week for severe hypokalemia and hyperosmolar status secondary to uncontrolled diabetes was discharged 2 days ago to home and was brought back today because of persistent nausea vomiting since morning.  Has been doing some abdominal discomfort denies any chest pain or shortness of breath.  Denies any diarrhea.  ED Course: In the ER patient's lab work comparable to the recent discharge labs.  CT abdomen and pelvis done shows which is concerning for chronic pancreatitis with possible pancreatic mass versus abscess.  Patient admitted for further management of persistent nausea vomiting and abnormal CAT scan finding.     Hospital Course    Nausea and vomiting -Appears to be and has resolved, work-up significant for chronic pancreatitis, which is known in the past, she denies any abdominal pain.  Her diet was advanced, she tolerated clear liquid diet, advance to soft diet today, overall she has been doing good, no further nausea or vomiting and tolerating her soft diet. -Overall poorly controlled diabetes mellitus, but unlikely gastroparesis, as per daughter this is acute in onset not recurrent.  Pancreatic mass -GI  consulted, overall she is not a candidate for any intervention regardless to the biopsy findings, they have discussed with GI, at this point family decided they do not want to pursue any further work-up  Diabetes mellitus type 2 -  recent admission for hyperosmolar status with hemoglobin A1c was around 16. Patient is on NPH 5 units twice daily.   CBG has been controlled during hospital stay  Hypertension - on hydralazine Imdur and metoprolol.  Chronic kidney disease stage IV  - with recent worsening which is improved.   History of sick sinus syndrome  - status post pacemaker placement.  History of peripheral vascular disease  - on antiplatelet agents.  History  of CVA with right-sided weakness.  Possible UTI -Culture growing UTI, had no IV access during hospital stay, she will be discharged to finish 3 days of Keflex   Goals of care -I have discussed with patient, daughter and granddaughter at bedside, goals of care, patient elderly, failure to thrive, frail, I have told him she is high risk for multiple admissions in the future, and family were unhappy with multiple sticks for blood work, trying to obtain IV access as she is a hard stick, so at this point they do not wish for any more work-up during this hospital stay, and they opted for no central line this hospital stay which is very appropriate, she has been seen by palliative, and family decided to go home with hospice, which is very appropriate in this patient case. -Patient with significant arthritis, arthritis pain, causing extreme discomfort, we have tried Vicodin during hospital stay, with good results, she will be discharged on Vicodin for pain control as needed basis, I have discussed with daughter, she will monitor her closely for any side effects.    Discharge Condition:  D/C home with hospice   Follow UP  Follow-up Information    Mansouraty, Telford Nab., MD Follow up on 11/14/2017.   Specialties:   Gastroenterology, Internal Medicine Why:  1:45 pm Contact information: [place] [address] [phone]             Discharge Instructions  and  Discharge Medications    Discharge Instructions    Discharge instructions   Complete by:  As directed    Follow with Primary MD Glendale Chard, MD in 7 days   Get CBC, CMP,checked  by Primary MD next visit.    Activity: As tolerated with Full fall precautions use walker/cane & assistance as needed   Disposition Home    Diet: Heart Healthy , carb modified , low salt, with feeding assistance and aspiration precautions.   On your next visit with your primary care physician please Get Medicines reviewed and adjusted.   Please request your Prim.MD to go over all Hospital Tests and Procedure/Radiological results at the follow up, please get all Hospital records sent to your Prim MD by signing hospital release before you go home.   If you experience worsening of your admission symptoms, develop shortness of breath, life threatening emergency, suicidal or homicidal thoughts you must seek medical attention immediately by calling 911 or calling your MD immediately  if symptoms less severe.  You Must read complete instructions/literature along with all the possible adverse reactions/side effects for all the Medicines you take and that have been prescribed to you. Take any new Medicines after you have completely understood and accpet all the possible adverse reactions/side effects.   Do not drive, operating heavy machinery, perform activities at heights, swimming or participation in water activities or provide baby sitting services if your were admitted for syncope or siezures until you have seen by Primary MD or a Neurologist and advised to do so again.  Do not drive when taking Pain medications.    Do not take more than prescribed Pain, Sleep and Anxiety Medications  Special Instructions: If you have smoked or chewed  Tobacco  in the last 2 yrs please stop smoking, stop any regular Alcohol  and or any Recreational drug use.  Wear Seat belts while driving.   Please note  You were cared for by a hospitalist during your hospital stay. If you have any questions about your discharge  medications or the care you received while you were in the hospital after you are discharged, you can call the unit and asked to speak with the hospitalist on call if the hospitalist that took care of you is not available. Once you are discharged, your primary care physician will handle any further medical issues. Please note that NO REFILLS for any discharge medications will be authorized once you are discharged, as it is imperative that you return to your primary care physician (or establish a relationship with a primary care physician if you do not have one) for your aftercare needs so that they can reassess your need for medications and monitor your lab values.     Allergies as of 09/22/2017      Reactions   Norvasc [amlodipine Besylate] Swelling   Peanut-containing Drug Products Other (See Comments)   Stomach pain      Medication List    STOP taking these medications   acetaminophen 325 MG tablet Commonly known as:  TYLENOL     TAKE these medications   aspirin EC 81 MG tablet Take 81 mg by mouth daily.   bisacodyl 5 MG EC tablet Commonly known as:  DULCOLAX Take 1 tablet (5 mg total) by mouth daily as needed for moderate constipation.   calcium-vitamin D 500-200 MG-UNIT tablet Commonly known as:  OSCAL WITH D Take 2 tablets by mouth 4 (four) times daily.   cephALEXin 250 MG capsule Commonly known as:  KEFLEX Take 1 capsule (250 mg total) by mouth every 8 (eight) hours.   diclofenac sodium 1 % Gel Commonly known as:  VOLTAREN Apply 2 g topically 4 (four) times daily.   feeding supplement (GLUCERNA SHAKE) Liqd Take 237 mLs by mouth 3 (three) times daily between meals.   Fish Oil 1000 MG Caps Take 1,000 mg  by mouth 2 (two) times daily.   hydrALAZINE 10 MG tablet Commonly known as:  APRESOLINE Take 1 tablet (10 mg total) by mouth 3 (three) times daily.   HYDROcodone-acetaminophen 10-325 MG tablet Commonly known as:  NORCO Take 1 tablet by mouth every 6 (six) hours as needed for severe pain.   insulin NPH-regular Human (70-30) 100 UNIT/ML injection Commonly known as:  NOVOLIN 70/30 Inject 5 Units into the skin 2 (two) times daily.   isosorbide mononitrate 120 MG 24 hr tablet Commonly known as:  IMDUR Take 1 tablet (120 mg total) by mouth daily. Resume in 2 days.   metoprolol tartrate 25 MG tablet Commonly known as:  LOPRESSOR Take 25 mg by mouth 2 (two) times daily.   Vitamin D (Ergocalciferol) 50000 units Caps capsule Commonly known as:  DRISDOL Take 1 capsule (50,000 Units total) by mouth every 7 (seven) days.         Diet and Activity recommendation: See Discharge Instructions above   Consults obtained -  GI Palliative   Major procedures and Radiology Reports - PLEASE review detailed and final reports for all details, in brief -     Ct Abdomen Pelvis Wo Contrast  Result Date: 09/20/2017 CLINICAL DATA:  83 year old female recently treated for low potassium and bacterial infection. Possible UTI. Failure to thrive. Initial encounter. EXAM: CT ABDOMEN AND PELVIS WITHOUT CONTRAST TECHNIQUE: Multidetector CT imaging of the abdomen and pelvis was performed following the standard protocol without IV contrast. COMPARISON:  01/14/2015 CT. FINDINGS: Lower chest: Basilar atelectasis with small pleural effusions. Subtle basilar infiltrates secondary consideration. Heart size within normal limits. Biventricular pacer in place. Tip is in the  region of the right atrium and right ventricle. Three vessel coronary artery calcification. Hepatobiliary: Taking into account limitation by non contrast imaging, no worrisome hepatic lesion. No calcified gallstones. Pancreas: Evidence of prior  chronic pancreatitis with calcifications throughout the pancreas. Additionally, in the pancreatic tail region extending into the lesser sac and anterior to the splenic artery is a 5.2 x 2.8 x 3.3 cm abnormality (series 2, image 19 and series 6, image 71). This may represent pancreatic mass or abscess. Spleen: Tiny splenic calcification.  No splenomegaly. Adrenals/Urinary Tract: No hydronephrosis or obstructing stone. Right renal cyst. Taking into account limitation by non contrast imaging, no worrisome renal or adrenal abnormality. Noncontrast filled urinary bladder with tiny amount a gas which may be related to recent manipulation. Clinical correlation recommended. Small cystocele. Mild circumferential wall thickening. Stomach/Bowel: Long segment of colon is under distended with mild circumferential wall thickening. Third spacing of fluid limits evaluation for the detection of extraluminal bowel inflammatory process. In the proper clinical setting, colitis is a consideration. Stomach under distended and evaluation limited. Decompressed gastric walls appear slightly thickened. Gastritis not excluded. Limited evaluation of small bowel secondary to lack of fat planes. Vascular/Lymphatic: Prominent aortic calcification without aneurysm. Prominent aortic branch vessel atherosclerotic changes and iliac artery/femoral artery atherosclerotic changes with probable areas of moderate to marked narrowing. Scattered small lymph nodes. Reproductive: No worrisome adnexal mass. Other: No free air pneumatosis or portal venous gas. No bowel containing hernia. Third spacing of fluid. Musculoskeletal: Fusion L5-S1. Chronic compression deformity L1-L2 and L4 without change. Scoliosis and degenerative changes. Hip joint degenerative changes. IMPRESSION: Third spacing of fluid, lack of fat planes and lack of contrast limited evaluation. Evidence of chronic pancreatitis. Additionally, in the pancreatic tail region extending into the  lesser sac and anterior to the splenic artery is a 5.2 x 2.8 x 3.3 cm abnormality (series 2, image 19 and series 6, image 71). This may represent a pancreatic mass or abscess. Diffuse circumferential colonic wall thickening may be related to under distension and third spacing of fluid although colitis not excluded in proper clinical setting. Under distended stomach with gastric wall thickening and therefore gastritis not excluded in proper clinical setting. Aortic Atherosclerosis (ICD10-I70.0). Prominent aortic branch vessel atherosclerotic changes and iliac artery/femoral artery atherosclerotic changes with probable areas of moderate to marked narrowing. Bibasilar atelectasis with small pleural effusions. Subtle basilar infiltrates is a secondary consideration. Electronically Signed   By: Genia Del M.D.   On: 09/20/2017 18:51   US Renal  Result Date: 09/17/2017 CLINICAL DATA:  Acute on chronic renal failure and history of diabetes. History of chronic kidney disease, stage IV. EXAM: RENAL / URINARY TRACT ULTRASOUND COMPLETE COMPARISON:  01/15/2016 FINDINGS: Right Kidney: Length: 9.2 cm. Stable echogenic appearance with small 1.6 cm upper pole simple cyst. No hydronephrosis. Left Kidney: Length: 9.7 cm.  Stable echogenic appearance without hydronephrosis. Bladder: The bladder is completely decompressed by a Foley catheter. IMPRESSION: The kidneys are stable in size and shows stable echogenic appearance consistent with underlying chronic kidney disease. No evidence of renal obstruction. Electronically Signed   By: Aletta Edouard M.D.   On: 09/17/2017 10:35   Ir Fluoro Guide Cv Line Right  Result Date: 09/16/2017 INDICATION: Diabetic ketoacidosis, limited access EXAM: ULTRASOUND AND FLUOROSCOPIC RIGHT IJ TRIPLE-LUMEN PICC LINE MEDICATIONS: 1% lidocaine local ANESTHESIA/SEDATION: Moderate Sedation Time: None. The patient's level of consciousness and vital signs were monitored continuously by radiology  nursing throughout the procedure under my direct supervision. FLUOROSCOPY TIME:  Fluoroscopy  Time: 12 seconds (1 mGy). COMPLICATIONS: None immediate. PROCEDURE: Informed written consent was obtained from the patient's power of attorney after a thorough discussion of the procedural risks, benefits and alternatives. All questions were addressed. Maximal Sterile Barrier Technique was utilized including caps, mask, sterile gowns, sterile gloves, sterile drape, hand hygiene and skin antiseptic. A timeout was performed prior to the initiation of the procedure. Under sterile conditions and local anesthesia, ultrasound micropuncture access performed of the right internal jugular vein. Images obtained for documentation of the patent right internal jugular vein. Guidewire advanced easily followed by the peel-away sheath. Measurements obtained for the appropriate length. Over the guidewire, a 15 cm 6 French triple-lumen PICC line was advanced with the tip position at the SVC RA junction. Position confirmed with fluoroscopy. Images obtained for documentation. Blood aspirated easily followed by saline heparin flushes. Appropriate volume and strength of heparin instilled in all lumens followed by external caps. Catheter secured with Ethilon sutures and a sterile dressing. No immediate complication. Patient tolerated the procedure well. IMPRESSION: Successful ultrasound and fluoroscopic right IJ triple-lumen 6 French PICC line. Tip SVC RA junction. Ready for use. Electronically Signed   By: Jerilynn Mages.  Shick M.D.   On: 09/16/2017 10:55   Ir US Guide Vasc Access Right  Result Date: 09/16/2017 INDICATION: Diabetic ketoacidosis, limited access EXAM: ULTRASOUND AND FLUOROSCOPIC RIGHT IJ TRIPLE-LUMEN PICC LINE MEDICATIONS: 1% lidocaine local ANESTHESIA/SEDATION: Moderate Sedation Time: None. The patient's level of consciousness and vital signs were monitored continuously by radiology nursing throughout the procedure under my direct  supervision. FLUOROSCOPY TIME:  Fluoroscopy Time: 12 seconds (1 mGy). COMPLICATIONS: None immediate. PROCEDURE: Informed written consent was obtained from the patient's power of attorney after a thorough discussion of the procedural risks, benefits and alternatives. All questions were addressed. Maximal Sterile Barrier Technique was utilized including caps, mask, sterile gowns, sterile gloves, sterile drape, hand hygiene and skin antiseptic. A timeout was performed prior to the initiation of the procedure. Under sterile conditions and local anesthesia, ultrasound micropuncture access performed of the right internal jugular vein. Images obtained for documentation of the patent right internal jugular vein. Guidewire advanced easily followed by the peel-away sheath. Measurements obtained for the appropriate length. Over the guidewire, a 15 cm 6 French triple-lumen PICC line was advanced with the tip position at the SVC RA junction. Position confirmed with fluoroscopy. Images obtained for documentation. Blood aspirated easily followed by saline heparin flushes. Appropriate volume and strength of heparin instilled in all lumens followed by external caps. Catheter secured with Ethilon sutures and a sterile dressing. No immediate complication. Patient tolerated the procedure well. IMPRESSION: Successful ultrasound and fluoroscopic right IJ triple-lumen 6 French PICC line. Tip SVC RA junction. Ready for use. Electronically Signed   By: Jerilynn Mages.  Shick M.D.   On: 09/16/2017 10:55   Dg Chest Port 1 View  Result Date: 09/20/2017 CLINICAL DATA:  84 year old with acute mental status changes, generalized weakness and failure to thrive. EXAM: PORTABLE CHEST 1 VIEW COMPARISON:  05/06/2017, 07/25/2016 and earlier. FINDINGS: Cardiac silhouette UPPER normal in size for AP portable technique, unchanged. Thoracic aorta atherosclerotic. Hilar and mediastinal contours otherwise unremarkable. Suboptimal inspiration with atelectasis in the  lung bases, LEFT greater than RIGHT. Lungs otherwise clear. Pulmonary vascularity normal. No visible pleural effusions. Severe generalized osseous demineralization. Severe degenerative changes involving the RIGHT shoulder. Vascular stent in what I suspect is the RIGHT common carotid artery. IMPRESSION: Suboptimal inspiration which accounts for bibasilar atelectasis, LEFT greater than RIGHT. No acute cardiopulmonary disease otherwise.  Electronically Signed   By: Evangeline Dakin M.D.   On: 09/20/2017 14:40   [place] Ekg Site Rite  Result Date: 09/20/2017 If Site Rite image not attached, placement could not be confirmed due to current cardiac rhythm.   Micro Results     Recent Results (from the past 240 hour(s))  MRSA PCR Screening     Status: None   Collection Time: 09/16/17 11:13 AM  Result Value Ref Range Status   MRSA by PCR NEGATIVE NEGATIVE Final    Comment:        The GeneXpert MRSA Assay (FDA approved for NASAL specimens only), is one component of a comprehensive MRSA colonization surveillance program. It is not intended to diagnose MRSA infection nor to guide or monitor treatment for MRSA infections. Performed at [facility], [place] [address] [place], [place] [address]   Culture, Urine     Status: None   Collection Time: 09/16/17 12:19 PM  Result Value Ref Range Status   Specimen Description   Final    URINE, CATHETERIZED Performed at [facility] [address] [place], [place] [address]    Special Requests   Final    NONE Performed at [facility], [place] [address], [place], [place] [address]    Culture   Final    NO GROWTH Performed at [facility], [place] [address], [place], [place] [address]    Report Status 09/17/2017 FINAL  Final  Culture, Urine     Status: Abnormal (Preliminary result)   Collection Time: 09/20/17  7:33 PM  Result Value Ref Range Status   Specimen Description   Final    URINE, CLEAN  CATCH Performed at [facility], [place] [address], [place], [place] [address]    Special Requests   Final    NONE Performed at [facility], [place] [address], [place], [place] [address]    Culture (A)  Final    20,000 COLONIES/mL ESCHERICHIA COLI SUSCEPTIBILITIES TO FOLLOW Performed at [facility], [place] [address] [place], [place] [address]    Report Status PENDING  Incomplete       Today   Subjective:   Carmen [name] today has no headache,no chest or abdominal pain, she is feeling better today, been drinking her Glucerna yesterday, and she has improved oral intake, no nausea or vomiting .  Objective:   Blood pressure (!) 148/53, pulse 71, temperature 99.5 F (37.5 C), temperature source Oral, resp. rate 16, height 5\' 2"  (1.575 m), weight 52.7 kg (116 lb 2.9 oz), SpO2 97 %.   Intake/Output Summary (Last 24 hours) at 09/22/2017 1415 Last data filed at 09/22/2017 1319 Gross per 24 hour  Intake 480 ml  Output 500 ml  Net -20 ml    Exam  Frail elderly female, laying in bed in no apparent distress Symmetrical Chest wall movement, Good air movement bilaterally, CTAB RRR,No Gallops,Rubs or new Murmurs, No Parasternal Heave +ve B.Sounds, Abd Soft, No tenderness,No rebound - guarding or rigidity. No Cyanosis, Clubbing or edema, No new Rash or bruise     Data Review   CBC w Diff:  Lab Results  Component Value Date   WBC 4.9 09/20/2017   HGB 12.9 09/20/2017   HCT 38.0 09/20/2017   PLT 168 09/20/2017   LYMPHOPCT 23 09/20/2017   MONOPCT 3 09/20/2017   EOSPCT 1 09/20/2017   BASOPCT 0 09/20/2017    CMP:  Lab Results  Component  Value Date   NA 138 09/20/2017   NA 139 11/18/2014   K 4.9 09/20/2017   CL 108 09/20/2017   CO2 23 09/20/2017   BUN 38 (H) 09/20/2017   BUN 16 11/18/2014   CREATININE 2.20 (H) 09/20/2017   GLU 74 11/18/2014   PROT 5.1 (L) 09/20/2017   ALBUMIN 1.9 (L) 09/20/2017   BILITOT 0.6 09/20/2017    ALKPHOS 131 (H) 09/20/2017   AST 35 09/20/2017   ALT 24 09/20/2017  .   Total Time in preparing paper work, data evaluation and todays exam - 11 minutes  Phillips Climes M.D on 09/22/2017 at 2:15 PM  [facility]   Office  [phone]

## 2017-09-22 NOTE — Discharge Instructions (Signed)
Follow with Primary MD Glendale Chard, MD in 7 days   Get CBC, CMP,checked  by Primary MD next visit.    Activity: As tolerated with Full fall precautions use walker/cane & assistance as needed   Disposition Home    Diet: Heart Healthy , carb modified , low salt, with feeding assistance and aspiration precautions.   On your next visit with your primary care physician please Get Medicines reviewed and adjusted.   Please request your Prim.MD to go over all Hospital Tests and Procedure/Radiological results at the follow up, please get all Hospital records sent to your Prim MD by signing hospital release before you go home.   If you experience worsening of your admission symptoms, develop shortness of breath, life threatening emergency, suicidal or homicidal thoughts you must seek medical attention immediately by calling 911 or calling your MD immediately  if symptoms less severe.  You Must read complete instructions/literature along with all the possible adverse reactions/side effects for all the Medicines you take and that have been prescribed to you. Take any new Medicines after you have completely understood and accpet all the possible adverse reactions/side effects.   Do not drive, operating heavy machinery, perform activities at heights, swimming or participation in water activities or provide baby sitting services if your were admitted for syncope or siezures until you have seen by Primary MD or a Neurologist and advised to do so again.  Do not drive when taking Pain medications.    Do not take more than prescribed Pain, Sleep and Anxiety Medications  Special Instructions: If you have smoked or chewed Tobacco  in the last 2 yrs please stop smoking, stop any regular Alcohol  and or any Recreational drug use.  Wear Seat belts while driving.   Please note  You were cared for by a hospitalist during your hospital stay. If you have any questions about your discharge medications or  the care you received while you were in the hospital after you are discharged, you can call the unit and asked to speak with the hospitalist on call if the hospitalist that took care of you is not available. Once you are discharged, your primary care physician will handle any further medical issues. Please note that NO REFILLS for any discharge medications will be authorized once you are discharged, as it is imperative that you return to your primary care physician (or establish a relationship with a primary care physician if you do not have one) for your aftercare needs so that they can reassess your need for medications and monitor your lab values.

## 2017-09-23 ENCOUNTER — Other Ambulatory Visit: Payer: Self-pay

## 2017-09-23 LAB — URINE CULTURE: Culture: 20000 — AB

## 2017-09-23 NOTE — Patient Outreach (Signed)
[facility]) Care Management  09/23/2017  SIMRAH CHATHAM 05-21-1933 [id number]   EMMI- General Discharge RED ON EMMI ALERT Day # 1 Date: 09/23/17 Red Alert Reason: Got Discharge papers? No Know who to call about changes in condition? No Scheduled follow up appointment? No  Outreach attempt # 1 telephone call to patient. Spoke with spouse Lynnetta Tom. He is able to verify HIPAA. He states that patient is asleep right now.  Discussed reason for call and asked him if patient is with hospice.  He states that she is and that the nurse is to come on tomorrow at 10:00 am.  He shares that patient was with hospice for about a year before but repeats that they are coming tomorrow.  Asked if he had any questions or concerns. He denies any concerns. Advised him that if he does have questions hospice would be available to answer questions.  He verbalized understanding.   Plan: RN CM will close case at this time.     Jone Baseman, RN, MSN [facility] Care Management Care Management Coordinator Direct Line [phone] Toll Free: [phone]  Fax: [phone]

## 2017-09-26 DIAGNOSIS — E1159 Type 2 diabetes mellitus with other circulatory complications: Secondary | ICD-10-CM | POA: Diagnosis not present

## 2017-09-26 DIAGNOSIS — C259 Malignant neoplasm of pancreas, unspecified: Secondary | ICD-10-CM | POA: Diagnosis not present

## 2017-09-26 DIAGNOSIS — I25119 Atherosclerotic heart disease of native coronary artery with unspecified angina pectoris: Secondary | ICD-10-CM | POA: Diagnosis not present

## 2017-09-26 DIAGNOSIS — I10 Essential (primary) hypertension: Secondary | ICD-10-CM | POA: Diagnosis not present

## 2017-11-06 DEATH — deceased

## 2017-11-14 ENCOUNTER — Ambulatory Visit: Payer: Medicare HMO | Admitting: Gastroenterology

## 2018-05-08 ENCOUNTER — Other Ambulatory Visit: Payer: Self-pay | Admitting: Internal Medicine
